# Patient Record
Sex: Male | Born: 1940 | Race: White | Hispanic: No | Marital: Married | Smoking: Former smoker
Health system: Southern US, Community
[De-identification: ages and names within clinical notes are randomized; demographics above are authoritative.]

## PROBLEM LIST (undated history)

## (undated) DIAGNOSIS — I255 Ischemic cardiomyopathy: Secondary | ICD-10-CM

## (undated) DIAGNOSIS — I509 Heart failure, unspecified: Secondary | ICD-10-CM

## (undated) DIAGNOSIS — I10 Essential (primary) hypertension: Secondary | ICD-10-CM

## (undated) DIAGNOSIS — I2510 Atherosclerotic heart disease of native coronary artery without angina pectoris: Secondary | ICD-10-CM

## (undated) DIAGNOSIS — E119 Type 2 diabetes mellitus without complications: Secondary | ICD-10-CM

## (undated) DIAGNOSIS — K219 Gastro-esophageal reflux disease without esophagitis: Secondary | ICD-10-CM

## (undated) DIAGNOSIS — M1990 Unspecified osteoarthritis, unspecified site: Secondary | ICD-10-CM

## (undated) DIAGNOSIS — I480 Paroxysmal atrial fibrillation: Secondary | ICD-10-CM

## (undated) DIAGNOSIS — I519 Heart disease, unspecified: Secondary | ICD-10-CM

## (undated) DIAGNOSIS — U071 COVID-19: Secondary | ICD-10-CM

## (undated) DIAGNOSIS — F419 Anxiety disorder, unspecified: Secondary | ICD-10-CM

## (undated) DIAGNOSIS — E785 Hyperlipidemia, unspecified: Secondary | ICD-10-CM

## (undated) DIAGNOSIS — I447 Left bundle-branch block, unspecified: Secondary | ICD-10-CM

## (undated) DIAGNOSIS — F329 Major depressive disorder, single episode, unspecified: Secondary | ICD-10-CM

## (undated) DIAGNOSIS — L219 Seborrheic dermatitis, unspecified: Secondary | ICD-10-CM

## (undated) DIAGNOSIS — N289 Disorder of kidney and ureter, unspecified: Secondary | ICD-10-CM

## (undated) DIAGNOSIS — F32A Depression, unspecified: Secondary | ICD-10-CM

## (undated) HISTORY — DX: Unspecified osteoarthritis, unspecified site: M19.90

## (undated) HISTORY — DX: Left bundle-branch block, unspecified: I44.7

## (undated) HISTORY — DX: Heart disease, unspecified: I51.9

## (undated) HISTORY — DX: Type 2 diabetes mellitus without complications: E11.9

## (undated) HISTORY — DX: Atherosclerotic heart disease of native coronary artery without angina pectoris: I25.10

## (undated) HISTORY — DX: Anxiety disorder, unspecified: F41.9

## (undated) HISTORY — DX: COVID-19: U07.1

## (undated) HISTORY — DX: Hyperlipidemia, unspecified: E78.5

## (undated) HISTORY — DX: Essential (primary) hypertension: I10

## (undated) HISTORY — PX: KNEE ARTHROTOMY: SUR107

## (undated) HISTORY — DX: Seborrheic dermatitis, unspecified: L21.9

## (undated) HISTORY — DX: Ischemic cardiomyopathy: I25.5

## (undated) HISTORY — DX: Paroxysmal atrial fibrillation: I48.0

---

## 1996-01-16 HISTORY — PX: CORONARY ARTERY BYPASS GRAFT: SHX141

## 2001-04-17 HISTORY — PX: LUMBAR DISC SURGERY: SHX700

## 2001-10-22 ENCOUNTER — Emergency Department (HOSPITAL_COMMUNITY): Admission: EM | Admit: 2001-10-22 | Discharge: 2001-10-22 | Payer: Self-pay | Admitting: Emergency Medicine

## 2001-10-22 ENCOUNTER — Encounter: Payer: Self-pay | Admitting: Emergency Medicine

## 2002-01-28 ENCOUNTER — Encounter: Payer: Self-pay | Admitting: Specialist

## 2002-01-28 ENCOUNTER — Encounter: Admission: RE | Admit: 2002-01-28 | Discharge: 2002-01-28 | Payer: Self-pay | Admitting: Specialist

## 2002-03-07 ENCOUNTER — Encounter: Payer: Self-pay | Admitting: Specialist

## 2002-03-08 ENCOUNTER — Inpatient Hospital Stay (HOSPITAL_COMMUNITY): Admission: RE | Admit: 2002-03-08 | Discharge: 2002-03-11 | Payer: Self-pay | Admitting: Specialist

## 2003-01-23 ENCOUNTER — Encounter: Payer: Self-pay | Admitting: Emergency Medicine

## 2003-01-23 ENCOUNTER — Inpatient Hospital Stay (HOSPITAL_COMMUNITY): Admission: EM | Admit: 2003-01-23 | Discharge: 2003-01-27 | Payer: Self-pay | Admitting: Emergency Medicine

## 2003-01-24 ENCOUNTER — Encounter: Payer: Self-pay | Admitting: Internal Medicine

## 2003-01-25 ENCOUNTER — Encounter: Payer: Self-pay | Admitting: Internal Medicine

## 2003-01-26 ENCOUNTER — Encounter: Payer: Self-pay | Admitting: Internal Medicine

## 2003-08-24 ENCOUNTER — Ambulatory Visit (HOSPITAL_COMMUNITY): Admission: RE | Admit: 2003-08-24 | Discharge: 2003-08-24 | Payer: Self-pay | Admitting: Gastroenterology

## 2004-08-15 HISTORY — PX: CARDIAC DEFIBRILLATOR PLACEMENT: SHX171

## 2004-08-29 ENCOUNTER — Ambulatory Visit: Payer: Self-pay | Admitting: Internal Medicine

## 2004-09-09 ENCOUNTER — Ambulatory Visit: Payer: Self-pay | Admitting: Internal Medicine

## 2004-09-09 ENCOUNTER — Ambulatory Visit (HOSPITAL_COMMUNITY): Admission: RE | Admit: 2004-09-09 | Discharge: 2004-09-10 | Payer: Self-pay | Admitting: Cardiology

## 2007-06-16 HISTORY — PX: CORONARY ANGIOPLASTY WITH STENT PLACEMENT: SHX49

## 2007-07-09 ENCOUNTER — Ambulatory Visit (HOSPITAL_COMMUNITY): Admission: RE | Admit: 2007-07-09 | Discharge: 2007-07-10 | Payer: Self-pay | Admitting: Cardiology

## 2009-05-16 ENCOUNTER — Ambulatory Visit: Payer: Self-pay | Admitting: Diagnostic Radiology

## 2009-05-16 ENCOUNTER — Emergency Department (HOSPITAL_BASED_OUTPATIENT_CLINIC_OR_DEPARTMENT_OTHER): Admission: EM | Admit: 2009-05-16 | Discharge: 2009-05-16 | Payer: Self-pay | Admitting: Emergency Medicine

## 2010-01-17 ENCOUNTER — Ambulatory Visit (HOSPITAL_COMMUNITY): Admission: RE | Admit: 2010-01-17 | Discharge: 2010-01-18 | Payer: Self-pay | Admitting: Cardiology

## 2010-04-17 HISTORY — PX: CATARACT EXTRACTION W/ INTRAOCULAR LENS  IMPLANT, BILATERAL: SHX1307

## 2010-05-19 ENCOUNTER — Telehealth (INDEPENDENT_AMBULATORY_CARE_PROVIDER_SITE_OTHER): Payer: Self-pay | Admitting: *Deleted

## 2010-05-25 NOTE — Progress Notes (Signed)
Summary: new pt defib check  Phone Note Call from Patient Call back at [phone]   Caller: Spouse/paula Reason for Call: Talk to Nurse Summary of Call: pt wife is calling to set up appt for th ept to get his defib check. pt wife states pt was ref by Dr. Francisca December to get his defib read and check.  pt wife states she called two days ago and spoke with lela and some one was going to get back with her. pt is calling and would like to talk to Neoma Uhrich.   Initial call taken by: Roe Coombs,  May 19, 2010 9:17 AM  Follow-up for Phone Call        [facility] scheduled pt for device check. Vella Kohler  May 20, 2010 3:18 PM

## 2010-05-26 ENCOUNTER — Encounter: Payer: Self-pay | Admitting: Internal Medicine

## 2010-05-26 ENCOUNTER — Encounter (INDEPENDENT_AMBULATORY_CARE_PROVIDER_SITE_OTHER): Payer: Commercial Indemnity

## 2010-05-26 DIAGNOSIS — I428 Other cardiomyopathies: Secondary | ICD-10-CM

## 2010-06-01 ENCOUNTER — Encounter: Payer: Self-pay | Admitting: Internal Medicine

## 2010-06-08 NOTE — Procedures (Signed)
Summary: Cardiology Device Clinic   Current Medications (verified): 1)  Seroquel Xr 200 Mg Xr24h-Tab (Quetiapine Fumarate) .... Take 1 Tablet By Mouth Every Evening 2)  Venlafaxine Hcl 150 Mg Xr24h-Cap (Venlafaxine Hcl) .... Take 1 Capsule By Mouth Once Daily 3)  Niaspan 500 Mg Cr-Tabs (Niacin (Antihyperlipidemic)) .... Take 2 Tablets By Mouth Two Times A Day 4)  Lotrel 5-20 Mg Caps (Amlodipine Besy-Benazepril Hcl) .... Take 1 Tablet By Mouth Once Daily 5)  Carvedilol 25 Mg Tabs (Carvedilol) .... Take 1 Tablet By Mouth Two Times A Day 6)  Pravastatin Sodium 40 Mg Tabs (Pravastatin Sodium) .... Take 1 Tablet By Mouth Once Daily 7)  Lovaza 1 Gm Caps (Omega-3-Acid Ethyl Esters) .... Take 2 Capsules By Mouth Two Times A Day 8)  Triamcinolone Acetonide 0.1 % Crea (Triamcinolone Acetonide) .... Apply Sparingly To Affected Area Externally Two Times A Day As Needed 9)  Furosemide 40 Mg Tabs (Furosemide) .... Take 1 Tablet By Mouth As Needed 10)  Aspirin 325 Mg Tabs (Aspirin) .... Take 1 Tablet By Mouth Once Daily 11)  Allopurinol 100 Mg Tabs (Allopurinol) .... Take 2 Tablets By Mouth Once Daily 12)  Ambien Cr 12.5 Mg Cr-Tabs (Zolpidem Tartrate) .... Take 1 Tablet By Mouth At Bedtime As Needed For Sleep 13)  Novolog Mix 70/30 Flexpen 70-30 % Susp (Insulin Aspart Prot & Aspart) .... 68 Units Subcutaneous Two Times A Day As Directed  Allergies (verified): No Known Drug Allergies   ICD Follow Up Battery Voltage:  3.21 V     Charge Time:  8.8 seconds     Underlying rhythm:  SR @ 73   ICD Device Measurements Atrium:  Amplitude: 3.4 mV, Impedance: 456 ohms, Threshold: 0.75 V at 0.40 msec Right Ventricle:  Amplitude: 20 mV, Impedance: 608 ohms, Threshold: 0.75 V at 0.40 msec Shock Impedance: 50/63 ohms   Episodes MS Episodes:  0     Shock:  0     ATP:  0     Nonsustained:  0     Atrial Therapies:  0 Atrial Pacing:  <0.1%     Ventricular Pacing:  <0.1%  Brady Parameters Mode MVP     Lower Rate  Limit:  40     Upper Rate Limit 130 PAV 180     Sensed AV Delay:  150  Tachy Zones VF:  >200     VT1:  171-200     Next Cardiology Appt Due:  08/16/2010 Tech Comments:  NORMAL DEVICE FUNCTION.  2 NST EPISODES. OPTIVOL STABLE AT THIS TIME.  IMPEDANCE TRENDS STABLE.  TURNED ON 1:1 SVT AND CHANGED RA OUTPUT FROM 1.50 TO 2.00 AND RV OUTPUT FROM 2.25 TO 2.50 V.  CHANGED MAX LEAD IMPEDANCES FOR LIA.  PT PREFERS OV RATHER THAN CARELINK.  ROV IN 3 MONTHS W/JA IN [facility] OFFICE. Vella Kohler  June 01, 2010 5:42 PM

## 2010-06-23 NOTE — Cardiovascular Report (Signed)
Summary: Office Visit   Office Visit   Imported By: Roderic Ovens 06/13/2010 16:05:29  _____________________________________________________________________  External Attachment:    Type:   Image     Comment:   External Document

## 2010-06-29 LAB — GLUCOSE, CAPILLARY
Glucose-Capillary: 139 mg/dL — ABNORMAL HIGH (ref 70–99)
Glucose-Capillary: 205 mg/dL — ABNORMAL HIGH (ref 70–99)
Glucose-Capillary: 243 mg/dL — ABNORMAL HIGH (ref 70–99)
Glucose-Capillary: 249 mg/dL — ABNORMAL HIGH (ref 70–99)

## 2010-07-04 LAB — DIFFERENTIAL
Basophils Absolute: 0.1 K/uL (ref 0.0–0.1)
Basophils Relative: 1 % (ref 0–1)
Eosinophils Absolute: 0.1 K/uL (ref 0.0–0.7)
Eosinophils Relative: 0 % (ref 0–5)
Lymphocytes Relative: 3 % — ABNORMAL LOW (ref 12–46)
Lymphs Abs: 0.4 10*3/uL — ABNORMAL LOW (ref 0.7–4.0)
Monocytes Absolute: 0.6 K/uL (ref 0.1–1.0)
Monocytes Relative: 4 % (ref 3–12)
Neutro Abs: 12.5 K/uL — ABNORMAL HIGH (ref 1.7–7.7)
Neutrophils Relative %: 91 % — ABNORMAL HIGH (ref 43–77)

## 2010-07-04 LAB — URINE MICROSCOPIC-ADD ON

## 2010-07-04 LAB — COMPREHENSIVE METABOLIC PANEL
CO2: 26 mEq/L (ref 19–32)
Calcium: 9.1 mg/dL (ref 8.4–10.5)
Creatinine, Ser: 1.3 mg/dL (ref 0.4–1.5)
GFR calc Af Amer: >60 mL/min (ref >60)
GFR calc non Af Amer: 55 mL/min — ABNORMAL LOW (ref >60)
Glucose, Bld: 232 mg/dL — ABNORMAL HIGH (ref 70–99)

## 2010-07-04 LAB — COMPREHENSIVE METABOLIC PANEL WITH GFR
ALT: 16 U/L (ref 0–53)
AST: 24 U/L (ref 0–37)
Albumin: 4.5 g/dL (ref 3.5–5.2)
Alkaline Phosphatase: 72 U/L (ref 39–117)
BUN: 27 mg/dL — ABNORMAL HIGH (ref 6–23)
Chloride: 102 meq/L (ref 96–112)
Potassium: 4.8 meq/L (ref 3.5–5.1)
Sodium: 141 meq/L (ref 135–145)
Total Bilirubin: 0.5 mg/dL (ref 0.3–1.2)
Total Protein: 8.3 g/dL (ref 6.0–8.3)

## 2010-07-04 LAB — CBC
HCT: 46.2 % (ref 39.0–52.0)
Hemoglobin: 15.1 g/dL (ref 13.0–17.0)
MCHC: 32.7 g/dL (ref 30.0–36.0)
MCV: 91.8 fL (ref 78.0–100.0)
Platelets: 240 K/uL (ref 150–400)
RBC: 5.04 MIL/uL (ref 4.22–5.81)
RDW: 14.5 % (ref 11.5–15.5)
WBC: 13.7 K/uL — ABNORMAL HIGH (ref 4.0–10.5)

## 2010-07-04 LAB — URINALYSIS, ROUTINE W REFLEX MICROSCOPIC
Bilirubin Urine: NEGATIVE
Glucose, UA: NEGATIVE mg/dL
Hgb urine dipstick: NEGATIVE
Ketones, ur: 15 mg/dL — AB
Nitrite: NEGATIVE
Protein, ur: 30 mg/dL — AB
Specific Gravity, Urine: 1.023 (ref 1.005–1.030)
Urobilinogen, UA: 0.2 mg/dL (ref 0.0–1.0)
pH: 6 (ref 5.0–8.0)

## 2010-07-04 LAB — LIPASE, BLOOD: Lipase: 62 U/L (ref 23–300)

## 2010-09-02 NOTE — H&P (Signed)
NAME:  [name], [name]                          ACCOUNT NO.:  [id number]   MEDICAL RECORD NO.:  [id number]                   PATIENT TYPE:  AMB   LOCATION:  DAY                                  FACILITY:  [facility]   PHYSICIAN:  Jene Every, M.D.                 DATE OF BIRTH:  [birth year]   DATE OF ADMISSION:  03/07/2002  DATE OF DISCHARGE:                                HISTORY & PHYSICAL   CHIEF COMPLAINT:  Low back and lower extremity pain.   HISTORY OF PRESENT ILLNESS:  The patient is a 70 year old male who back in  July 2003 while at work noticed a popping pain in his lower back.  This  occurred when he was bending over to pick up a container that weighed  approximately 50 pounds.  When he twisted to set it up on a shelf this is  when he noticed the pop in his back.  The pain gradually became worse and  the next day he was seen at [facility] emergency room where x-rays were  taken.  He was subsequently seen in our office and diagnosed with mechanical  low back pain with acute lumbar strain.  The patient was followed under  conservative measures which included a Dosepak, anti-inflammatories.  The  patient continues to not progress.  He was later seen by Dr. Ethelene Hal for  epidural steroid injections which he got no relief from.  He was also sent  to physical therapy where he did not progress or meet any of his goals  except for independence and compliance with the home exercise program.  CT  myelogram of his lumbar spine showed a moderate disk protrusion at L4-5 with  multifactorial lateral recess stenosis, left greater than the right, with  apparent compression of the L5 nerve root.  Considering the fact the patient  had failed conservative measures and he did not want to live with the  symptoms that he was having at this point, he decided to undergo surgical  intervention.  The risks and benefits of the procedure were discussed with  the patient by Dr. Shelle Iron and the patient will be  admitted to [facility] on March 07, 2002 to undergo a lumbar decompression at L4-5.   PAST MEDICAL HISTORY:  1. Non-insulin-dependent diabetes.  2. Hypercholesterolemia.  3. Gout.  4. Hypertension.  5. Coronary artery disease.  6. Osteoarthritis.   PAST SURGICAL HISTORY:  1. Bypass x5 in 1997.  2. Right knee scope.   CURRENT MEDICATIONS:  1. Amaryl 4 mg one p.o. q.d.  2. Coreg 25 mg one p.o. b.i.d.  3. Spironolactone 25 mg one-half p.o. q.d.  4. Altace 5 mg one p.o. b.i.d.  5. Colchicine 0.6 mg one p.o. q.d.  6. Zoloft 50 mg one p.o. q.d.  7. Pravachol 20 mg one p.o. q.d.  8. Niaspan 1000 mg two p.o. q.d.  9.  Aspirin one p.o. q.d.  10.      Vioxx 25 mg one p.o. p.r.n. pain.   ALLERGIES:  No known drug allergies.   SOCIAL HISTORY:  The patient is married.  He lives at home with his wife.  He has three children.  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  He denies any tobacco  use.  Does admit to occasional alcohol use.  He lives in a one-story home  with only one entry stair.  His wife will be his caregiver following  surgery.   FAMILY HISTORY:  Significant for coronary artery disease, cerebrovascular  accidents, diabetes mellitus.  Mother is living.  Father is deceased at age  65 of natural causes.   REVIEW OF SYSTEMS:  GENERAL:  No fevers, chills, night sweats, or bleeding  tendencies.  CNS:  No blurred or double vision, seizure, headache, or  paralysis.  RESPIRATORY:  No shortness of breath, productive cough, or  hemoptysis.  CARDIOVASCULAR:  No chest pain, angina, or orthopnea.  GI:  Positive for diarrhea.  No nausea, vomiting, constipation, or melena.  GU:  No dysuria, hematuria, or discharge.  MUSCULOSKELETAL:  As pertinent to the  HPI.   PHYSICAL EXAMINATION:  VITAL SIGNS:  Pulse 80, respiratory rate 16, blood  pressure 142/76.  GENERAL:  This is a well-developed, well-nourished 70 year old male in no  acute distress.  He does have a slightly blunted affect.   HEENT:  Atraumatic, normocephalic.  Pupils equal, round, and reactive to  light.  EOMs intact.  NECK:  Supple, no lymphadenopathy.  CHEST:  Clear to auscultation bilaterally.  No rhonchi, wheezes, or rales.  HEART:  Regular rate and rhythm without murmurs, gallops, or rubs.  ABDOMEN:  Soft, nontender, nondistended.  Bowel sounds x4.  BREAST AND GENITOURINARY:  Not examined not pertinent to HPI.  SKIN:  No rashes or lesions are noted.  MUSCULOSKELETAL:  Straight leg raise on the left produces buttocks,  posterior thigh, and calf pain.  Straight leg raise on the right also  produces buttock and posterior thigh pain.  EHL is 5-5 on the left.  Sensation is altered in the L5 dermatome.  The patient is nontender over the  thoracic spine.  His lateral hips and knees are examined and are  unremarkable.  NEUROLOGIC:  Neurovascular exam is intact.   LABORATORY DATA:  Myelogram demonstrates moderate disk protrusion at L4-5  with multifactorial lateral recess stenosis of left greater than right with  apparent compression of the L5 nerve root.   IMPRESSION:  1. Spinal stenosis, L4-5.  2. Non-insulin-dependent diabetes.  3. Hypercholesterolemia.  4. Gout.  5. Hypertension.  6. Coronary artery disease.  7. Osteoarthritis.    PLAN:  The patient will be admitted to [facility] on March 07, 2002 to undergo a lumbar decompression at L4-5 by  Dr. Jene Every.  The patient has cardiac clearance from Dr. Amil Amen and  also medical clearance from Dr. Arvilla Market.  They will be involved in his  medical care while he is an inpatient.     Roma Schanz, P.A.                   Jene Every, M.D.    CS/MEDQ  D:  02/27/2002  T:  02/27/2002  Job:  [id number]

## 2010-09-02 NOTE — Cardiovascular Report (Signed)
NAMEDANE, KOPKE NO.:  [id number]   MEDICAL RECORD NO.:  [id number]          PATIENT TYPE:  OIB   LOCATION:  6522                         FACILITY:  [facility]   PHYSICIAN:  Francisca December, M.D.  DATE OF BIRTH:  [birth year]   DATE OF PROCEDURE:  DATE OF DISCHARGE:  07/10/2007                            CARDIAC CATHETERIZATION   DATE OF PROCEDURE:  July 09, 2007.   PROCEDURES PERFORMED:  1. Left heart catheterization.  2. Coronary angiography.  3. Saphenous vein graft angiography.  4. Arterial conduit angiography.  5. Percutaneous transluminal coronary intervention/bare-metal stent      implantation distal right coronary via the SVG.   INDICATIONS:  [name] is a 70 year old man with severe  atherosclerotic cardiovascular disease.  He is 12 years status post  CABG.  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  He has known moderate reduction in left ventricular  systolic function.  He has had recently increasing fatigue and  exertional dyspnea.  Myocardial perfusion study in the office revealed  LVEF in the 35% range and a large relatively fixed inferior wall defect  as well as a new mid anterior wall defect that was reversible.  Because  of the new anterior defect, although it was relatively small and a drop  in his LVEF, he was brought to the catheterization laboratory at this  time to identify progressive CAD or graft disease and provide for  further therapeutic options.   PROCEDURAL NOTE:  The patient is brought to the cardiac catheterization  laboratory in the fasting state.  The right groin was prepped and draped  in the usual sterile fashion.  Local anesthesia was obtained with  infiltration of 1% lidocaine.  A 6-French catheter sheath was inserted  potentially into the right femoral artery utilizing an anterior approach  over a guiding J-wire.  Left heart catheterization and coronary  angiography then proceeded  in standard fashion using a 6-French 110 cm  pigtail catheter and a 6-French number 4 left and right Judkins  catheters.  The Judkins catheter was used to cannulate the saphenous  vein graft to the marginal branch.  It was also used to cannulate and  perform angiography to the saphenous vein graft to the right coronary.  A left coronary bypass graft catheter was required for the saphenous  vein graft to the diagonal branch.  The LIMA catheter was used to  perform angiography of the LIMA to the LAD via the left subclavian  artery.   These images were reviewed and I elected to proceed with a percutaneous  intervention of an 80-90% stenosis in the distal right coronary via the  saphenous vein graft.  The patient received 50 mg/kg of heparin, 300 mg  of Plavix, a double bolus and constant infusion of Integrilin.  The  resultant ACT was 280 seconds.   A 6-French multipurpose number 1 guiding catheter was advanced to the  ascending aorta where the saphenous vein graft to the right coronary was  cannulated.  A 0.014 inch Luge intracoronary guidewire was passed across  the lesion of  the distal right coronary without difficulty.  Initial  balloon dilatation was performed with a 2.5/20 mm Scimed Maverick  intracoronary balloon.  This was positioned across the lesion and  inflated to 6 atmospheres for 20 seconds.  This balloon was deflated and  removed.  A 3.0/20 mm Scimed Taxus Liberte was then advanced into place,  carefully positioned and deployed at peak pressure of 11 atmospheres for  50 seconds.  The stent balloon was deflated and removed, and post-  dilatation performed with a 3.5/15 mm Quantum Maverick intracoronary  balloon.  This was carefully positioned within the stented segment and  inflated in 2 positions to not greater than 16 atmospheres for  approximately 50 seconds.  Stent balloon was deflated and removed.  Following confirmation of adequate patency in orthogonal views both  with  and without the guidewire in place, the guiding catheter was removed.  A  right femoral arteriogram in the 45 degree RAO angulation via the  catheter sheath by hand injection documented adequate anatomy for  placement of percutaneous closure by Angioseal.  This was subsequent  deployed with good hemostasis and intact distal pulse.   HEMODYNAMIC RESULTS:  Systemic arterial pressure was 137/71 with a mean  of 99 mmHg.  There was no systolic gradient across the aortic valve.  Left ventricular end-diastolic pressure was 19 mmHg  pre-ventriculogram.   ANGIOGRAPHY:  The left ventriculogram demonstrated normal chamber size  and a mild-moderate reduction of left ventricular systolic function.  There is inferobasal severe hypokinesis, mid inferior akinesis and infra-  apical hypokinesis.  Visual estimate of the ejection fraction is in the  30-35% range.  There is no significant mitral regurgitation.  The aortic  valve is trileaflet and opens normally during systole.  Left and right  coronary calcification is seen.   There is a right-dominant coronary system present.  Main left coronary  artery is fairly long and has a 30% ostial stenosis.  Then in the distal  segment, there is a 90% stenosis which is best seen in the LAO cranial  angulation.  The ongoing anterior descending artery then has an 80%  stenosis in the proximal segment which is diffuse in nature and then is  completely occluded after the first septal perforator.   There is a small-moderate-sized diagonal branch which arises very  proximally on the anterior descending artery that has no obstruction  seen other than some moderate stenosis in the very proximal ostial  portion of the LAD.   The left circumflex coronary artery is highly diseased; there is a small  first marginal branch that arises and then the distal portion is highly  diseased, small and appears to show some competitive filling, but not  further than the small  second diagonal branch.   The right coronary artery is completely occluded at its origin.   The saphenous vein graft to the marginal branch is widely patent and  inserts upon a relatively large vessel that provides retrograde and  antegrade filling.  Retrograde filling enters the circumflex and  proceeds distally to very small posterolateral branch.  It proceeds more  proximally to the segment where competitive flow was seen earlier.  Antegrade filling completely fills a bifurcating marginal branch on the  direct lateral wall of the heart.   The saphenous vein graft to the right coronary is widely patent.  It  inserts upon the distal portion of the right coronary.  Minimal degree  of retrograde filling is seen to 2 small right  ventricular branches and  then the right coronary is completely occluded in the mid-distal  segment.  Antegrade filling enters the large posterolateral segment  after the origin of the small-moderate sized posterior descending  artery.  The posterolateral segment contains an eccentric tubular 90%  stenosis and then the distal portion of the posterolateral segment gives  rise to 2 small left ventricular branches and then a large  posterolateral branch.   The LIMA graft to the LAD is widely patent and inserts upon the mid-  distal vessel.  It provides retrograde and antegrade filling.  Retrograde filling is to the complete occlusion and it follows several  small septal perforators.  Antegrade filling proceeds to the apex where  the LAD bifurcates into the whale tail.  There are no significant  obstructions seen in the ongoing anterior descending artery.   The saphenous vein graft to the diagonal branch is widely patent and  inserts upon a moderate-sized diagonal branch which has no obstructions  seen except within the retrograde segment where it is completely  occluded as it inserts upon the LAD.   Following balloon dilatation in the posterolateral segment of  the right  coronary, no residual stenosis is seen.   FINAL IMPRESSION:  1. Atherosclerotic cardiovascular disease, severe three-vessel.  2. Moderate left ventricular systolic dysfunction with regional wall      motion abnormalities as noted, left ventricular ejection fraction      30-35%.  3. Elevated left ventricular end-diastolic pressure.  4. Status post successful percutaneous coronary intervention/bare-      metal stent implantation posterolateral segment of the right      coronary.  5. There is a residual tight distal left main stenosis with minimal      flow into the left anterior descending and also the left      circumflex, although a small-moderate sized diagonal branch is      present which is likely producing the anterior defect on his      perfusion study.      Francisca December, M.D.  Electronically Signed     JHE/MEDQ  D:  07/11/2007  T:  07/12/2007  Job:  [id number]

## 2010-09-02 NOTE — Op Note (Signed)
NAME:  [name], [name]                          ACCOUNT NO.:  [id number]   MEDICAL RECORD NO.:  [id number]                   PATIENT TYPE:  AMB   LOCATION:  DAY                                  [facility]   PHYSICIAN:  Jene Every, M.D.                 DATE OF BIRTH:  1940-07-08   DATE OF PROCEDURE:  DATE OF DISCHARGE:                                 OPERATIVE REPORT   PREOPERATIVE DIAGNOSES:  Spinal stenosis, herniated nucleus pulposus L4-5.   POSTOPERATIVE DIAGNOSES:  Spinal stenosis, herniated nucleus pulposus L4-5,  bulging disk.   PROCEDURE:  Central decompression, bilateral hemilaminotomy, lateral recess  decompression, foraminotomy.   ANESTHESIA:  General.   ASSISTANT:  Roma Schanz, P.A.   BRIEF HISTORY:  A 70 year old with refractory lower extremity radicular pain  right greater than left. MRI indicating lateral recess stenosis with a  compression of the 5 nerve root. This is multifactorial, operative  intervention was indicated for decompression. The risks and benefits were  discussed including bleeding, infection, damage to neurovascular structures,  CSF leakage, epidural fibrosis, adjacent segment disease, etc.   TECHNIQUE:  The patient in the supine position, after an adequate level of  general anesthesia and 1 gm of Kefzol, he was placed prone on the [place]  frame. All bony prominences were well-padded, lumbar region was prepped and  draped in the usual sterile fashion. Two 18-gauge spinal needles were  utilized to localize the L4-5 interspace confirmed on x-rays.  Attempt was  made at the spinous processes 4-5, subcutaneous tissue was dissected,  electrocautery utilized to achieve hemostasis. The dorsolumbar fascia was  identified and divided in line with the skin incisions. The paraspinous  muscles was elevated from the lamina of 4 and 5. Obtained a confirmatory  radiograph.   Next, we attempted to form a hemilaminotomy bilaterally; however, due  to the  sheer angle of the lamina and the fairly small interlaminar width, felt it  was safer to proceed with a central decompression. We therefore removed the  ligamentum in the spinous process and partial spinous process of 3, L4 and  L5. I decompressed the lateral recess with a 2 and a 3 mm Kerrison  decompressing the facet, partial medial hemifacetectomy was performed  bilaterally. I then undercut the facet and removed the ligamentum flavum  bilaterally. There was severe ligamentum flavum hypertrophy and compression  of 5 roots in the lateral recess. We decompressed that after performing an  L5 foraminotomy. Gently mobilized the nerve root protecting it all times.  Bipolar electrocautery was utilized to achieve hemostasis with extensive  epidural venous plexus. This was decompressed on the left and on the right  as well. I examined the disk on both sides and there was some bulging noted  but no frank herniation and I felt that due to the central decompression,  additional stability afforded by the intact disk was imperative therefore  left that uncompromised. Hockey stick probe placed at the foramen of 4 and 5  found to be widely patent. We copiously irrigated the wound and placed  thrombin soaked Gelfoam in the laminotomy defect. Bone wax was placed over  the cancellous surfaces. I removed the McCullough retractor and the  operating microscope, inspected the paraspinous muscles. Inspected with no  evidence of active bleeding and was meticulously hemostased. I then  reapproximated the dorsolumbar fascia and the musculature with #1 Vicryl  interrupted figure-of-eight sutures. The subcutaneous tissue was  reapproximated with  subcutaneous 2-0 Vicryl simple sutures, the skin was  reapproximated with staples. The  wound was dressed sterilely patient placed supine on the hospital bed,  extubated without difficulty and transported to the recovery room in  satisfactory condition.   The  patient tolerated the procedure well with no complications.                                                Jene Every, M.D.    Cordelia Pen  D:  03/07/2002  T:  03/07/2002  Job:  [id number]

## 2010-09-02 NOTE — Op Note (Signed)
[name], [name]                ACCOUNT NO.:  [id number]   MEDICAL RECORD NO.:  [id number]          PATIENT TYPE:  OIB   LOCATION:  2899                         FACILITY:  [facility]   PHYSICIAN:  Francisca December, M.D.  DATE OF BIRTH:  Oct 13, 1940   DATE OF PROCEDURE:  09/09/2004  DATE OF DISCHARGE:                                 OPERATIVE REPORT   PROCEDURES PERFORMED:  1.  Left subclavian venogram.  2.  Insertion of single chamber implantable cardiac defibrillator.   SURGEON:  Corliss Marcus, M.D.   ELECTROPHYSIOLOGIST:  Sherryl Manges, M.D.   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 The patient has developed worsening left ventricular function which  is now an ejection fraction of 28% by a recent gated spec study. The patient  does not have significant symptoms of heart failure nor are there AV  conduction abnormalities. He is to undergo insertion of a single chamber  implantable cardiac defibrillator under the MEDIT II criteria.   PROCEDURE NOTE:  The patient is brought to the cardiac catheterization  laboratory in the fasting state. The left prepectoral region was prepped and  draped in the usual sterile fashion. Local anesthesia was obtained with the  infiltration of 1% lidocaine with epinephrine throughout the left  prepectoral region. A left subclavian venogram was then performed with a  peripheral injection of 20 mL of Omnipaque. A digital cine angiogram was  obtained to guide future left subclavian puncture. The venogram did  demonstrate the subclavian vein to be widely patent and coursing in a normal  fashion over the anterior surface of the first rib and beneath the middle  third of the clavicle. There was no evidence for persistence of the left  superior vena cava. A 7-8 cm incision was then made in the deltopectoral  groove and this was carried down by  sharp dissection and electrocautery to  the prepectoral fascia. There, a plane was lifted and a pocket formed  inferiorly and medially using electrocautery and blunt dissection. The  pocket was then packed with 1% kanamycin soaked gauze. A single left  subclavian puncture was then performed using an 18-gauge thin-wall needle  through which was passed a 0.038-inch tight J guidewire. Over this  guidewire, a 9-French tear away sheath and dilator were advanced. The  dilator was removed. The wire was allowed to remain in place. The  ventricular lead was advanced to the level of the right atrium and the  sheath was torn away. A figure-of-eight hemostasis suture was required  around the insertion site of the lead in the pectoralis muscle to control  backbleeding. Using standard technique and fluoroscopic landmarks, the lead  was manipulated into the right ventricular apex. There, excellent pacing  parameters were obtained both pre and post advancing the retention screw.  These parameters are reported below. The lead was tested for diaphragmatic  pacing at 10 volts and none was found. The lead was then sutured into place  using three separate silk ligatures. The kanamycin  soaked gauze and  remaining guidewire were removed from the pocket, and the pocket was  copiously irrigated using 1% kanamycin solution. The pace shock generator  was then connected to the leads carefully identifying each and placing each  into the appropriate receptacle. Each was tightened into place and tested  for security. The leads were then wound beneath the pacing generator and the  generator was placed in the pocket. The pocket was inspected for bleeding  and none was found.   Defibrillation testing was then performed under the supervision of Dr.  Sherryl Manges. The patient received a total of  11 milligrams of Versed and 200 mcg of fentanyl for significant conscious  sedation. Ventricular fibrillation was induced using  the shock on T  technique. There was successful detection and delivery of a 20 joules shock  after a 6-second sense and charge pause. The impedance was 47 ohms. After  another 5 minutes had elapsed, the ventricular defibrillation was again  induced by the shock on T technique. The device successfully detected and  charged this time  also in 6 seconds. A 20 joules shock was delivered with  successful type 1 break and restoration of sinus rhythm. Again, 47 ohms  impedance. There was no drop out at 1.2 millivolt sensitivity.   PACING DATA:  The ventricular lead detected an 18.7 mV R-wave. The impedance  was 1007 ohms. The pacing threshold was 0.2 volts at 0.5 milliseconds pulse  width. The current at capture threshold was 1.3 MA. These parameters were  obtained through the analyzer. A 13 mV R-wave was detected and the impedance  was 904 ohms. The threshold was 1.5 volts at 0.4 milliseconds.   EQUIPMENT DATA:  The pace shock generator is a Medtronic Maximo VR model  number 7232CX, serial number [id number]. The ventricular paced shock lead is  a Medtronic model number O152772, serial number [id number].      JHE/MEDQ  D:  09/09/2004  T:  09/09/2004  Job:  [id number]   cc:   Duke Salvia, M.D.   Donia Guiles, M.D.  [address] [place]  [place] [address]  Fax: [phone]

## 2010-09-02 NOTE — Discharge Summary (Signed)
NAME:  ROBERTS, BON                          ACCOUNT NO.:  [id number]   MEDICAL RECORD NO.:  [id number]                   PATIENT TYPE:  INP   LOCATION:  3710                                 FACILITY:  [facility]   PHYSICIAN:  Sherin Quarry, MD                   DATE OF BIRTH:  1940/11/17   DATE OF ADMISSION:  01/23/2003  DATE OF DISCHARGE:  01/27/2003                                 DISCHARGE SUMMARY   BRIEF HISTORY:  [name] is a 70 year old man who had an episode of  acute gout about six weeks prior to admission which was treated and resolved  over about one week.  On the Monday prior to admission, the patient again  developed right knee and left foot symptoms consistent with gout.  He was  placed on an increased dose of colchicine and then began to experience  severe diarrhea and abdominal discomfort.  He was evaluated the day of  admission in Dr. Roselie Skinner office where he was found to be confused,  possibly secondary to excess dose of sleeping pills and was experiencing  severe pain secondary to gout.   PHYSICAL EXAMINATION:  Physical examination at the time of admission as  described by Dr. Efraim Kaufmann L. Taylor:  VITAL SIGNS:  Blood pressure was 120/80, pulse 72, respiratory rate 18, O2  saturation was 96%.  HEENT:  Within normal limits.  CHEST:  Clear to auscultation.  CARDIOVASCULAR:  Regular rate and rhythm.  There was normal S1 and S2  without murmurs, rubs, or gallops.  ABDOMEN:  Soft, normal bowel sounds without masses or tenderness. There was  no guarding or rebound.  NEUROLOGIC:  Testing was remarkable for the patient being oriented to person  and place but not time.  He seemed to have some impairment of short-term  memory.  He did not appear to be experiencing auditory or visual  hallucinations at the time of admission.   ADMISSION LABORATORY DATA:  Sodium 133, potassium 4.0, creatinine 1.3, BUN  23.  Hemoglobin 9.9.  Cardiac enzymes were negative.   Relevant  additional studies obtained included a CT scan of the brain which  showed mild atrophy with no acute intracranial abnormalities.  MRI scan of  the brain was done.  This showed excellent vasculature with no signs of  obstructive changes.  There was no evidence on MRI for intracranial aneurysm  or hemorrhage.  No acute intracranial abnormalities were identified.   HOSPITAL COURSE:  Initially on admission, the patient was taken off of  colchicine with the thought that this was probably responsible for his  abdominal complaints.  He was placed on Indocin 50 mg t.i.d. by Dr. Ladona Ridgel.  Subsequently, the patient's gout improved but he experienced evidence of  some renal dysfunction and therefore, on January 26, 2003, the Indomethacin  was discontinued and he was given prednisone 40 mg daily.  By January 27, 2003, the patient appeared to be doing extremely well.  As best I could  determine, there was no indication of any residual gout symptoms.  He  indicated that he was not experiencing any joint pain.  Therefore, decision  was made to discharge at that time.   DISCHARGE DIAGNOSES:  1. Severe gout.  2. Abdominal pain possibly secondary to colchicine.  3. Coronary artery disease, status post coronary artery bypass grafting.  4. Hypertension.  5. Diabetes.   DISCHARGE MEDICATIONS:  1. The patient will be discharged on a rapidly tapering schedule of     prednisone as per the pink sheet.  We will have him take 30 mg x1 day, 20     mg x1 day, 10 mg x1 day, then stop.  This rapid taper is because of     concerns in regard to his blood sugar.  2. He will continue aspirin 325 mg daily.  3. Coreg 12.5 mg b.i.d.  4. Pravachol 30 mg daily.  5. Altace 5 mg daily.  6. Effexor 75 mg daily.  7. Amaryl 2 mg daily.  8. Pepcid.  9. Nortriptyline.   FOLLOW UP:  He will be instructed to follow up with Dr. Arvilla Market in five to  seven days.                                                Sherin Quarry,  MD    SY/MEDQ  D:  01/27/2003  T:  01/27/2003  Job:  [id number]   cc:   Donia Guiles, M.D.  [address] [place]  [place] [address]  Fax: [phone]

## 2010-09-02 NOTE — Op Note (Signed)
NAME:  Brandon, [name]                          ACCOUNT NO.:  [id number]   MEDICAL RECORD NO.:  [id number]                   PATIENT TYPE:  AMB   LOCATION:  ENDO                                 FACILITY:  [facility]   PHYSICIAN:  Danise Edge, M.D.                DATE OF BIRTH:  Jan 23, 1941   DATE OF PROCEDURE:  08/24/2003  DATE OF DISCHARGE:                                 OPERATIVE REPORT   PROCEDURE:  Screening colonoscopy.   REFERRED BY:  Donia Guiles, M.D.   INDICATIONS FOR PROCEDURE:  [name] is a 70 year old male born 12/01/1940.  [name] is scheduled to undergo his first screening colonoscopy  with polypectomy to prevent colon cancer.   ENDOSCOPIST:  Danise Edge, M.D.   PREMEDICATION:  Versed 6 mg, Demerol 60 mg.   DESCRIPTION OF PROCEDURE:  After obtaining informed consent, [name] was  placed in the left lateral decubitus position. I administered intravenous  Demerol and intravenous Versed to achieve conscious sedation for the  procedure. The patient's blood pressure, oxygen saturation and cardiac  rhythm were monitored throughout the procedure and documented in the medical  record.   Anal inspection and digital rectal exam were normal. The prostate was non-  nodular. The Olympus adjustable pediatric colonoscope was introduced into  the rectum and advanced to the cecum. Colonic preparation for the exam today  was satisfactory.   [name] has universal colonic diverticulosis without diverticulitis or  diverticular stricture formation.   RECTUM:  Normal.   SIGMOID COLON AND DESCENDING COLON:  Normal.   SPLENIC FLEXURE:  Normal.   TRANSVERSE COLON:  Normal.   HEPATIC FLEXURE:  Normal.   ASCENDING COLON:  Normal.   CECUM AND ILEOCECAL VALVE:  Normal.   ASSESSMENT:  Normal screening proctocolonoscopy to the cecum.  No endoscopic  evidence for the presence of colorectal neoplasia.                                               Danise Edge, M.D.    MJ/MEDQ  D:  08/24/2003  T:  08/24/2003  Job:  [id number]   cc:   Donia Guiles, M.D.  [address] [place]  [place] [address]  Fax: [phone]

## 2010-09-02 NOTE — Discharge Summary (Signed)
NAME:  [name], [name]                          ACCOUNT NO.:  [id number]   MEDICAL RECORD NO.:  [id number]                   PATIENT TYPE:  INP   LOCATION:  0465                                 FACILITY:  [facility]   PHYSICIAN:  Jene Every, M.D.                 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  2. Non-insulin-dependent diabetes.  3. Hypercholesterolemia.  4. Gout.  5. Hypertension.  6. Coronary artery disease.  7. Osteoarthritis.   DISCHARGE DIAGNOSES:  1. Spinal stenosis status post central decompression, bilateral     hemilaminotomy, lateral __________ decompression, and foraminotomy.  2. Non-insulin-dependent diabetes.  3. Hypercholesterolemia.  4. Gout.  5. Hypertension.  6. Coronary artery disease.  7. Osteoarthritis.  8. Mild hyponatremia.  9. Nausea due to pain medication.   PROCEDURE:  The patient was taken to the operating room on March 07, 2002  and underwent central decompression, bilateral hemilaminotomy, lateral  __________ decompression, and foraminotomy L4-5.  Surgeon was Dr. Jene Every.  Assistant is Roma Schanz, P.A.-C.  Surgery was done under  general anesthesia.  Also, additional procedure was done in the OR with  removal of the left great toenail.   CONSULTS:  PT/OT.   BRIEF HISTORY:  The patient is a 70 year old male who back in July 2003  while at work noticed a popping pain in his lumbar spine.  This occurred  when he was bending over to pick up a container that weighed approximately  50 pounds.  When he twisted to set it up on the shelf it was then that he  noticed a pop in his back.  The pain has gradually gotten worse.  The  patient has undergone conservative measures including dose pack and anti-  inflammatories __________ in the pain.  The patient also was seen by Dr.  Ethelene Hal for epidural steroid injection and also failed this therapy.  Physical  therapy goals were not met.  CT myelogram lumbar spine showed a moderate  disk protrusion at L4-5 with multifactorial lateral recess stenosis left  greater than right with compression at the L5 nerve root.  Considering the  fact that patient failed conservative measures and he did not feel that he  could live with the symptoms, he is to undergo intervention as indicated.  Therefore, the patient was subsequently admitted to the hospital to undergo  the above stated procedure.   LABORATORY DATA:  CBC on admission showed a hemoglobin of 11.7, hematocrit  35.0.  Serial H&Hs were followed throughout the hospital course but  hemoglobin did decline down to a level of 9.6, hematocrit of 29.0.  The  differential on the admission CBC was all within normal limits.  Chemistry  panel on admission showed a glucose of  260, otherwise normal values.  Serial  BMETs were followed throughout the hospital course which showed mild  hyponatremia.  At time of discharge the patient's sodium was 132, glucose of  184, and calcium of 8.1.  Urinalysis done at time of admission showed a  large amount of glucose, otherwise all within normal limits.   HOSPITAL COURSE:  The patient was admitted to [facility] and taken  to the operating room.  He underwent the above stated procedure without  complications.  He tolerated the procedure well and was allowed to return to  the recovery room and then to the orthopedic floor to continue postoperative  care.  The patient was placed on PCA analgesics of morphine postoperatively  for pain control.  The PCA was kept until postoperative day number three  when he was weaned over to p.o. analgesics.  The patient seemed to tolerate  this well, although he did have occasional episodes of some nausea.  At the  time of discharge the patient's pain was well controlled on p.o.  medications.  Hemoglobin  and hematocrit were followed very closely.  The  patient's hemoglobin did drop to 9.6 but he remained asymptomatic without  any weakness or dizziness noted.  The patient did have several episodes  postoperative day number two and three of hypoglycemia.  This was managed by  stopping some of his home medications including his Amaryl.  The patient  also treated with antiemetics for his nausea and was given fluid hydration  and serial BMETs were followed to monitor his hyponatremia.  Once these  actions were taken patient improved and his glucose level returned to  baseline.  The patient's neurovascular and motor function were intact  throughout hospital course.  The calves remained soft, nontender but the  patient did complain of some decreased sensation on the dorsal aspect of his  right foot.  Postoperative day number two the abdomen was slightly distended  but the patient did have bowel movement postoperative day number one.  Bowel  sounds were present.  The patient's abdomen was nontender.  Diet was  advanced as tolerated.  The patient was placed on Trinsicon one p.o. t.i.d.  on postoperative day number three for his postoperative hemorrhagic anemia.  Postoperative day number four the patient was doing well.  He continued to  have some low back and lower extremity soreness.  He was still slightly  nauseated, mainly due to pain medication.  The patient's pain was well  controlled on p.o. medication and it is felt at this time that patient could  be discharged home.   DISPOSITION:  The patient was discharged home on March 11, 2002.   DISCHARGE MEDICATIONS:  1. Mepergan Fortis p.r.n. pain.  2. Robaxin 500 mg p.r.n. spasm.  3. Trinsicon one p.o. q.d. x3 weeks.  4. He may resume all home medications.   DIET:  As tolerated.   ACTIVITY:  The patient can ambulate as tolerated.  He should avoid bending, stooping, twisting, or lifting.  No prolonged sitting or standing.  In  regard to  his left toe, the patient will be weightbearing as tolerated.  There are no restrictions in that regard.   WOUND CARE:  He is to keep the incision clean and dry.  He may shower.  Change dressing on q.d. basis.   FOLLOW UP:  He is to follow up with Dr. Shelle Iron in 10-14 days.  Call the  office for an appointment.   CONDITION ON DISCHARGE:  Stable.    Roma Schanz, P.A.                   Jene Every, M.D.   CS/MEDQ  D:  03/27/2002  T:  03/27/2002  Job:  [id number]

## 2010-09-02 NOTE — H&P (Signed)
NAME:  [name], [name]                          ACCOUNT NO.:  [id number]   MEDICAL RECORD NO.:  [id number]                   PATIENT TYPE:  INP   LOCATION:  3710                                 FACILITY:  [facility]   PHYSICIAN:  Melissa L. Ladona Ridgel, MD               DATE OF BIRTH:  Apr 12, 1941   DATE OF ADMISSION:  01/23/2003  DATE OF DISCHARGE:                                HISTORY & PHYSICAL   CHIEF COMPLAINT:  1. Change in mental status.  2. Acute gout flare.   HISTORY OF PRESENT ILLNESS:  This is a 70 year old white male who six weeks  ago developed an acute gout flare which resolved over the course of one  week, being treated with colchicine.  On the Monday prior to admission, the  patient again developed right knee and left foot symptoms consistent with  gout.  He was seen by his primary care physician, and his colchicine was  increased to four tablets per day.  On the day prior to admission, the  patient was experiencing copious diarrhea and such pain that he could not  sleep.  He therefore took one temazepam around 2 a.m.  After not sleeping  the rest of the evening, he decided to take a second at 7 a.m.  In the  morning, his wife was concerned and requested that he go to Dr. Izora Ribas  Cassiano's office.  In the office, he was found to be confused, with  terrible gout pain, and was sent to the emergency room.  In the emergency  room, the patient was found to be sonorous, able to be aroused and speaking,  but slightly confused as to what was going on.  His wife stated that during  the night, he was very restless and had auditory hallucinations, thinking  there were a large number of people in the house.   REVIEW OF SYSTEMS:  On the day of admission, revealed intense pain in  multiple joints secondary to gout, severe abdominal pain and diarrhea  secondary to colchicine.  He did deny chest pain, shortness of breath,  nausea or vomiting.  He did not perceive that he was having any  altered  mental status.  With the assistance of his wife, it was determined that he  was slightly confused.  A head CT in the emergency room revealed no acute  bleed.  However, did question the presence of a middle cerebral artery  aneurysm that did not appear to be bleeding.  The patient was therefore  admitted to the telemetry floor for further evaluation.   On further review of systems on this patient, the patient as stated,  complained of abdominal pain, slight nausea which was resolved, chills, no  numbness or tingling, and positive joint pain.  All else were negative.   PAST MEDICAL HISTORY:  1. Gout.  2. Coronary artery disease, status post coronary artery bypass graft x5  status post seven years prior to admission.  3. He had recently had a cardiac workup one year prior to this admission     which was found to have no disease present.  4. Hypertension.  5. Diabetes.   PAST SURGICAL HISTORY:  Back surgery.  He remains on disability secondary to  his back surgery.   SOCIAL HISTORY:  He denies tobacco.  He does drink two mixed drinks per  week.   FAMILY HISTORY:  Father is deceased secondary to coronary artery disease.  Mother is living with multiple medical problems.  Brother has also had  cardiac problems.   ALLERGIES:  CODEINE, unknown reaction.   MEDICATIONS AT HOME:  1. Aspirin 325 mg daily.  2. Colchicine 0.6 mg daily which has been increased to four times per day     during his last gout flare.  3. He takes Coreg 25 mg b.i.d.  4. Pravachol 30 mg daily.  5. He usually takes temazepam 30 mg q.h.s.  6. Altace 5 mg b.i.d.  7. Effexor 75 mg daily.  8. Amaryl 4 mg tablets.  He takes 1-1/2 tablets q. Daily.  9. He takes Pepcid over the counter.  10.      Niaspan 2000 mg at bedtime.  11.      Nortriptyline 25 mg a day.  12.      Bextra 20 mg, one half tablet twice a day with food.  13.      He is also using Colace and multivitamin.   PHYSICAL EXAMINATION:  VITAL  SIGNS:  In the emergency room, his vital signs  were stable.  He was afebrile.  His blood pressure ranged in the lower 110's  to 120's systolically with a pulse of 72.  Respiration rate was 18.  His  saturations were 96% on two liters.  GENERAL:  His mucous membranes were dry.  He was lethargic.  He was able to  converse.  HEENT:  Pupils equal, round and reactive to light. Extraocular muscles were  intact.  There was no pallor.  He was anicteric.  His tympanic membranes  were clear.  His fundoscopy exam revealed no hemorrhages with positive red  reflex.  Unable to appreciate the disk margins.  There were no carotid  bruits and no thyromegaly.  CHEST:  Clear to auscultation.  No rhonchi or rales, no wheezes.  CARDIOVASCULAR:  Exam revealed regular rate and rhythm, positive S1 and S2.  No S3 or S4.  No murmurs rubs, or gallops.  ABDOMEN:  Soft, nontender, nondistended with positive bowel sounds.  There  was no hepatosplenomegaly.  He did have increased borborygmus.  EXTREMITIES:  He had multiple joints with mild erythema and terrible pain,  obvious tophaceous deposits over these joints.  Power was 4/5 secondary to  pain, and his reflexes were symmetrical, but difficult to grade secondary to  pain.  NEUROLOGIC:  He is oriented to person and place, but not time.  He was  forgetful for some of the events.  He denied any active hallucinations.   LABORATORY DATA:  On admission, sodium was 133, potassium 4.0, chloride 98,  CO2 26, BUN 23, creatinine 1.3, and glucose of 143.  White count was 9.1  with hemoglobin of 9.9, hematocrit of 30.1, and platelet count of 173.  His  first set of troponins revealed 0.02 troponin.  CPK total was 208 and MB of  1.4.  His PT was slightly elevated at 42.  His PT, INR was also  slightly  elevated at 16.8 and 1.6.  The rest of the liver panel was within normal  limits.   This is a 70 year old white male who presents with acute gout flare and change in mental  status likely secondary to polypharmacy as the patient had  taken two sleeping pills within a short period of time.   ASSESSMENT/PLAN:  1. Altered mental status.  As stated, the patient had a negative CT scan for     acute bleed in the emergency room.  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  All sleeping medications were     held on admission, and neurologic checks were undertaken q.4h. With vital     signs.  2. Hyperglycemia.  The patient was noted to have a blood glucose of 143 in     the emergency room.  However, on the floor, it was found to be in the     50's.  His Amaryl was held, and he was started on D5 normal saline at 75     cc per hour.  3. Gout.  The patient had been taking excessive amounts of colchicine with     good bowel movements, and yet had no relief from the pain of his gout     flare.  It was determined that with the change in mental status, steroids     would not be the appropriate treatment of choice first line, so his     Effexor was held, aspirin was held, and he was started on Indomethacin at     15 mg with the intent that if prednisone was needed, it could start as     soon as his mental status cleared.  4. Gastroenterology.  Protonix will be started at 40 mg IV b.i.d., and his     Colace will be held.  5. Genitourinary.  Check a UA, C&S in light of his slight temperature and     change in mental status.  6. Psychiatry.  We will continue his Effexor after his mental status clears.  7. Cardiovascular.  We will continue his Coreg and Altace and provide     parameters for nursing to control whether or not he receives the     medications.  His Niaspan will be held at this time secondary to     increasing symptoms of gout which can be exacerbated by Niaspan.  His     Pravachol was also held.  A followup MRI was to be obtained to evaluate     the middle cerebral artery aneurysm noted on CT.                                                 Melissa L. Ladona Ridgel, MD    MLT/MEDQ  D:  01/25/2003  T:  01/25/2003  Job:  [id number]

## 2010-09-20 ENCOUNTER — Encounter: Payer: Self-pay | Admitting: Internal Medicine

## 2010-10-14 ENCOUNTER — Ambulatory Visit (INDEPENDENT_AMBULATORY_CARE_PROVIDER_SITE_OTHER): Payer: Commercial Indemnity | Admitting: Internal Medicine

## 2010-10-14 ENCOUNTER — Encounter: Payer: Self-pay | Admitting: Internal Medicine

## 2010-10-14 DIAGNOSIS — I428 Other cardiomyopathies: Secondary | ICD-10-CM

## 2010-10-14 DIAGNOSIS — E785 Hyperlipidemia, unspecified: Secondary | ICD-10-CM | POA: Insufficient documentation

## 2010-10-14 DIAGNOSIS — I2589 Other forms of chronic ischemic heart disease: Secondary | ICD-10-CM

## 2010-10-14 DIAGNOSIS — I5022 Chronic systolic (congestive) heart failure: Secondary | ICD-10-CM | POA: Insufficient documentation

## 2010-10-14 DIAGNOSIS — I255 Ischemic cardiomyopathy: Secondary | ICD-10-CM | POA: Insufficient documentation

## 2010-10-14 DIAGNOSIS — I509 Heart failure, unspecified: Secondary | ICD-10-CM

## 2010-10-14 LAB — ICD DEVICE OBSERVATION
AL THRESHOLD: 1 V
BATTERY VOLTAGE: 3.1969 V
DEV-0020ICD: NEGATIVE
PACEART VT: 0
RV LEAD THRESHOLD: 0.75 V
TOT-0006: 20111003000000
TZAT-0001ATACH: 1
TZAT-0001ATACH: 2
TZAT-0001ATACH: 3
TZAT-0002ATACH: NEGATIVE
TZAT-0004SLOWVT: 8
TZAT-0005SLOWVT: 88 pct
TZAT-0011SLOWVT: 10 ms
TZAT-0012ATACH: 150 ms
TZAT-0012ATACH: 150 ms
TZAT-0018ATACH: NEGATIVE
TZAT-0018ATACH: NEGATIVE
TZAT-0018ATACH: NEGATIVE
TZAT-0018FASTVT: NEGATIVE
TZAT-0019FASTVT: 8 V
TZAT-0020ATACH: 1.5 ms
TZAT-0020ATACH: 1.5 ms
TZAT-0020FASTVT: 1.5 ms
TZON-0003ATACH: 350 ms
TZON-0003VSLOWVT: 450 ms
TZON-0004VSLOWVT: 20
TZON-0005SLOWVT: 12
TZST-0001ATACH: 4
TZST-0001ATACH: 6
TZST-0001FASTVT: 3
TZST-0001FASTVT: 5
TZST-0001FASTVT: 6
TZST-0001SLOWVT: 2
TZST-0001SLOWVT: 3
TZST-0001SLOWVT: 5
TZST-0002ATACH: NEGATIVE
TZST-0002ATACH: NEGATIVE
TZST-0002ATACH: NEGATIVE
TZST-0002FASTVT: NEGATIVE
TZST-0003SLOWVT: 20 J
TZST-0003SLOWVT: 35 J
TZST-0003SLOWVT: 35 J
VENTRICULAR PACING ICD: 0.08 pct
VF: 0

## 2010-10-14 NOTE — Assessment & Plan Note (Signed)
Stable No change required today  

## 2010-10-14 NOTE — Assessment & Plan Note (Signed)
euvolemic on exam  Normal ICD function See Pace Art report No changes today

## 2010-10-14 NOTE — Patient Instructions (Signed)
Your physician wants you to follow-up in: 3 months with device clinic     Your physician wants you to follow-up in:12 months with DrAllred You will receive a reminder letter in the mail two months in advance. If you don't receive a letter, please call our office to schedule the follow-up appointment.

## 2010-10-14 NOTE — Assessment & Plan Note (Signed)
No ischemic symptoms No changes today 

## 2010-10-14 NOTE — Progress Notes (Signed)
[name] is a pleasant 70 y.o. 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  He has not received ICD therapy.  He had a sprint fidelis lead implanted initially 2006 which was replaced with a Psychologist, occupational (MDT) lead 2011.  The patient reports doing very well since having an ICD implanted and remains very active despite his age.   He plays golf and walks on a treadmill for 30 minutes three times per week.  Today, he  denies symptoms of palpitations, chest pain, shortness of breath, orthopnea, PND, lower extremity edema, dizziness, presyncope, syncope, or neurologic sequela.  The patientis tolerating medications without difficulties and is otherwise without complaint today.   Past Medical History  Diagnosis Date  . Ischemic cardiomyopathy     s/p ICD Implantation by Dr Amil Amen  . Chronic systolic dysfunction of left ventricle     EF 30%  . Diabetes mellitus   . CAD (coronary artery disease)     s/p CABG 1997, PCI (BMS) of SVG to RCA 3/09  . Hyperlipemia   . DJD (degenerative joint disease)   . Gout     Past Surgical History  Procedure Date  . Coronary artery bypass graft 01/1996  . Coronary stent placement 3/09    BMS to SVG to RCA  . Cardiac defibrillator placement 5/06    generator replacement with new RV lead placed by Dr Amil Amen 2011  . Back surgery 2003    History   Social History  . Marital Status: Married    Spouse Name: N/A    Number of Children: N/A  . Years of Education: N/A   Occupational History  . Not on file.   Social History Main Topics  . Smoking status: Never Smoker   . Smokeless tobacco: Not on file  . Alcohol Use: Yes     occasional  . Drug Use: No  . Sexually Active: Not on file   Other Topics Concern  . Not on file   Social History Narrative   Lives in [place], retired Production designer, theatre/television/film for [employer].  He collects antique metal toys    Family History  Problem Relation Age of Onset  . Coronary artery disease      Allergies  Allergen Reactions  . Codeine     Current Outpatient Prescriptions  Medication Sig Dispense Refill  . allopurinol (ZYLOPRIM) 100 MG tablet Take 2 tablets by mouth once daily       . amLODipine-benazepril (LOTREL) 5-20 MG per capsule Take 1 capsule by mouth daily.        Marland Kitchen aspirin 81 MG tablet Take 81 mg by mouth daily.        Marland Kitchen atorvastatin (LIPITOR) 40 MG tablet Take 40 mg by mouth daily.        . carvedilol (COREG) 25 MG tablet Take 25 mg by mouth 2 (two) times daily.        Marland Kitchen eplerenone (INSPRA) 25 MG tablet Take 25 mg by mouth daily.        . furosemide (LASIX) 40 MG tablet Take 40 mg by mouth as needed.        . insulin aspart protamine-insulin aspart (NOVOLOG 70/30) (70-30) 100 UNIT/ML injection Inject 68 Units into the skin as directed.       . Liraglutide (VICTOZA [place]) Inject into the  skin as directed.        . niacin (NIASPAN) 500 MG CR tablet Take 2 tablets by mouth two times a day       . omega-3 acid ethyl esters (LOVAZA) 1 G capsule Take 2 capsules by mouth 2 (two) times daily.        . QUEtiapine (SEROQUEL XR) 200 MG 24 hr tablet Take 200 mg by mouth at bedtime.        . triamcinolone (KENALOG) 0.1 % cream Apply sparingly to affected area externally two times a day as needed       . venlafaxine (EFFEXOR-XR) 150 MG 24 hr capsule Take 150 mg by mouth daily.        Marland Kitchen zolpidem (AMBIEN CR) 12.5 MG CR tablet Take 12.5 mg by mouth at bedtime as needed.        Marland Kitchen DISCONTD: aspirin 325 MG tablet Take 325 mg by mouth daily.        Marland Kitchen DISCONTD: pravastatin (PRAVACHOL) 40 MG tablet Take 40 mg by mouth daily.           Physical Exam: Filed Vitals:   10/14/10 1131  BP: 112/71  Pulse: 77  Resp: 16  Height: 5\' 10"  (1.778 m)  Weight: 238 lb (107.956 kg)    GEN- The patient is well appearing, alert and oriented x 3 today.   Head-  normocephalic, atraumatic Eyes-  Sclera clear, conjunctiva pink Ears- hearing intact Oropharynx- clear Neck- supple, no JVP Lymph- no cervical lymphadenopathy Lungs- Clear to ausculation bilaterally, normal work of breathing Chest- ICD pocket is well healed Heart- Regular rate and rhythm, no murmurs, rubs or gallops, PMI not laterally displaced GI- soft, NT, ND, + BS Extremities- no clubbing, cyanosis, or edema MS- no significant deformity or atrophy Skin- no rash or lesion Psych- euthymic mood, full affect Neuro- strength and sensation are intact  ICD interrogation- reviewed in detail today,  See PACEART report  Assessment and Plan: ROS- all systems are reviewed and negative except as per HPI

## 2010-11-07 ENCOUNTER — Ambulatory Visit
Admission: RE | Admit: 2010-11-07 | Discharge: 2010-11-07 | Disposition: A | Discharge status: Home / Self Care | Payer: Commercial Indemnity | Source: Ambulatory Visit | Attending: Family Medicine | Admitting: Family Medicine

## 2010-11-07 ENCOUNTER — Other Ambulatory Visit: Payer: Self-pay | Admitting: Family Medicine

## 2010-11-07 DIAGNOSIS — R52 Pain, unspecified: Secondary | ICD-10-CM

## 2011-01-09 LAB — BASIC METABOLIC PANEL
Calcium: 9
GFR calc Af Amer: >60
GFR calc non Af Amer: >60
Potassium: 3.7
Sodium: 135

## 2011-01-09 LAB — CBC
MCHC: 33.4
Platelets: 207
RDW: 17 — ABNORMAL HIGH

## 2011-01-11 ENCOUNTER — Encounter: Payer: Self-pay | Admitting: Internal Medicine

## 2011-01-11 ENCOUNTER — Ambulatory Visit (INDEPENDENT_AMBULATORY_CARE_PROVIDER_SITE_OTHER): Payer: Commercial Indemnity | Admitting: *Deleted

## 2011-01-11 DIAGNOSIS — I5022 Chronic systolic (congestive) heart failure: Secondary | ICD-10-CM

## 2011-01-11 DIAGNOSIS — I255 Ischemic cardiomyopathy: Secondary | ICD-10-CM

## 2011-01-11 DIAGNOSIS — I2589 Other forms of chronic ischemic heart disease: Secondary | ICD-10-CM

## 2011-01-11 DIAGNOSIS — I509 Heart failure, unspecified: Secondary | ICD-10-CM

## 2011-01-11 LAB — ICD DEVICE OBSERVATION
AL IMPEDENCE ICD: 456 Ohm
ATRIAL PACING ICD: 0.1 pct
BATTERY VOLTAGE: 3.19 V
DEV-0020ICD: NEGATIVE
TZAT-0001ATACH: 3
TZAT-0001SLOWVT: 1
TZAT-0002FASTVT: NEGATIVE
TZAT-0004SLOWVT: 8
TZAT-0005SLOWVT: 88 pct
TZAT-0011SLOWVT: 10 ms
TZAT-0012ATACH: 150 ms
TZAT-0012ATACH: 150 ms
TZAT-0012ATACH: 150 ms
TZAT-0018FASTVT: NEGATIVE
TZAT-0019ATACH: 6 V
TZAT-0019FASTVT: 8 V
TZAT-0020ATACH: 1.5 ms
TZAT-0020ATACH: 1.5 ms
TZAT-0020FASTVT: 1.5 ms
TZON-0003ATACH: 350 ms
TZON-0003VSLOWVT: 450 ms
TZON-0004SLOWVT: 16
TZST-0001ATACH: 4
TZST-0001ATACH: 6
TZST-0001FASTVT: 5
TZST-0001FASTVT: 6
TZST-0001SLOWVT: 2
TZST-0001SLOWVT: 5
TZST-0002ATACH: NEGATIVE
TZST-0002ATACH: NEGATIVE
TZST-0002FASTVT: NEGATIVE
TZST-0002FASTVT: NEGATIVE
TZST-0002FASTVT: NEGATIVE
TZST-0003SLOWVT: 35 J
TZST-0003SLOWVT: 35 J
VENTRICULAR PACING ICD: 0.1 pct

## 2011-01-16 ENCOUNTER — Encounter: Payer: Self-pay | Admitting: *Deleted

## 2011-02-03 ENCOUNTER — Inpatient Hospital Stay (HOSPITAL_BASED_OUTPATIENT_CLINIC_OR_DEPARTMENT_OTHER)
Admission: RE | Admit: 2011-02-03 | Discharge: 2011-02-03 | Disposition: A | Discharge status: Home / Self Care | Payer: Managed Care, Other (non HMO) | Source: Ambulatory Visit | Attending: Cardiology | Admitting: Cardiology

## 2011-02-03 DIAGNOSIS — I2510 Atherosclerotic heart disease of native coronary artery without angina pectoris: Secondary | ICD-10-CM | POA: Insufficient documentation

## 2011-02-03 DIAGNOSIS — R079 Chest pain, unspecified: Secondary | ICD-10-CM | POA: Insufficient documentation

## 2011-02-03 DIAGNOSIS — I25810 Atherosclerosis of coronary artery bypass graft(s) without angina pectoris: Secondary | ICD-10-CM | POA: Insufficient documentation

## 2011-02-06 ENCOUNTER — Institutional Professional Consult (permissible substitution) (INDEPENDENT_AMBULATORY_CARE_PROVIDER_SITE_OTHER): Payer: Commercial Indemnity | Admitting: Cardiothoracic Surgery

## 2011-02-06 ENCOUNTER — Encounter: Payer: Self-pay | Admitting: Cardiothoracic Surgery

## 2011-02-06 VITALS — BP 106/64 | HR 88 | Resp 20 | Ht 69.0 in | Wt 230.0 lb

## 2011-02-06 DIAGNOSIS — I2589 Other forms of chronic ischemic heart disease: Secondary | ICD-10-CM

## 2011-02-06 DIAGNOSIS — I2510 Atherosclerotic heart disease of native coronary artery without angina pectoris: Secondary | ICD-10-CM

## 2011-02-06 DIAGNOSIS — I255 Ischemic cardiomyopathy: Secondary | ICD-10-CM

## 2011-02-06 NOTE — Progress Notes (Signed)
[address]            [place] [address]          [phone]       [facility] Medical Record #[id number] Date of Birth: 09-Sep-1940  Quintella Reichert, MD Kari Baars, MD, MD  Chief Complaint:    Chief Complaint  Patient presents with  . Coronary Artery Disease     eval for Redo CABG    History of Present Illness:     The patient is to the office at the request of Dr. Mayford Knife after cardiac catheterization done last week. He undergone coronary bypass grafting 02/01/1996 at which time he had CA BG x 5 with the left internal mammary to the LAD, saphenous vein graft to the first and second diagonal, saphenous vein graft to the obtuse marginal,  saphenous vein graft to the right coronary artery. Since that time he's had AICD placed on 2 different occasions. He's had an angioplasty of the right coronary vein graft. Serial imaging showed decline in LV function until now he has ejection fraction of 27%. He notes the recent onset of substernal chest discomfort and exertional shortness of breath and repeat stress test was performed by Dr. Mayford Knife. He comes to the office today for consideration of redo coronary bypass grafting.   Current Activity/ Functional Status: Patient is independent with mobility/ambulation, transfers, ADL's, IADL's.   Past Medical History  Diagnosis Date  . Ischemic cardiomyopathy     s/p ICD Implantation by Dr Amil Amen  . Chronic systolic dysfunction of left ventricle     EF 30%  . Diabetes mellitus   . CAD (coronary artery disease)     s/p CABG 1997, PCI (BMS) of SVG to RCA 3/09  . Hyperlipemia   . DJD (degenerative joint disease)   . Gout     Past Surgical History  Procedure Date  . Coronary stent placement 3/09    BMS to SVG to RCA  . Cardiac defibrillator placement 5/06    generator replacement with new RV lead placed by Dr Amil Amen 2011  . Back surgery 2003  . Coronary artery bypass graft 01/1996    CABG x 5 LIMA to LAD SVG to diag1,2,svg to om,svg to RCA    History  Smoking status  . Never Smoker   Smokeless tobacco  . Not on file    History  Alcohol Use  . 0.6 oz/week  . 1 Cans of beer per week    occasional    History   Social History  . Marital Status: Married    Spouse Name: N/A    Number of Children: N/A  . Years of Education: N/A   Occupational History  . Not on file.   Social History Main Topics  . Smoking status: Never Smoker   . Smokeless tobacco: Not on file  . Alcohol Use: 0.6 oz/week    1 Cans of beer per week     occasional  . Drug Use: No  . Sexually Active: Not on file   Other Topics Concern  . Not on file   Social History Narrative   Lives in [place], retired Production designer, theatre/television/film for [employer].  He collects antique metal toys    Allergies  Allergen Reactions  . Codeine   . Latex Rash    Current Outpatient Prescriptions  Medication Sig  Dispense Refill  . allopurinol (ZYLOPRIM) 100 MG tablet Take 2 tablets by mouth once daily       . aspirin 81 MG tablet Take 325 mg by mouth daily.       Marland Kitchen atorvastatin (LIPITOR) 40 MG tablet Take 40 mg by mouth daily.        . carvedilol (COREG) 25 MG tablet Take 25 mg by mouth 2 (two) times daily.        Marland Kitchen eplerenone (INSPRA) 25 MG tablet Take 25 mg by mouth daily.        . furosemide (LASIX) 40 MG tablet Take 40 mg by mouth as needed.        . insulin aspart protamine-insulin aspart (NOVOLOG 70/30) (70-30) 100 UNIT/ML injection Inject 68 Units into the skin as directed.       . insulin glargine (LANTUS) 100 UNIT/ML injection Inject 40 Units into the skin daily.        . Liraglutide (VICTOZA [place]) Inject into the skin as directed.        . niacin (NIASPAN) 500 MG CR tablet Take 2 tablets by mouth two times a day       . venlafaxine (EFFEXOR-XR) 150 MG 24 hr capsule Take 150 mg by mouth daily.        Marland Kitchen zolpidem (AMBIEN CR) 12.5 MG CR tablet Take 12.5 mg by mouth at bedtime as needed.        Marland Kitchen  amLODipine-benazepril (LOTREL) 5-20 MG per capsule Take 1 capsule by mouth daily.        . colesevelam (WELCHOL) 625 MG tablet Take 1,875 mg by mouth.        . omega-3 acid ethyl esters (LOVAZA) 1 G capsule Take 2 capsules by mouth 2 (two) times daily.        . QUEtiapine (SEROQUEL XR) 200 MG 24 hr tablet Take 200 mg by mouth at bedtime.        . triamcinolone (KENALOG) 0.1 % cream Apply sparingly to affected area externally two times a day as needed          Family History  Problem Relation Age of Onset  . Coronary artery disease     Both his mother and father died at age 46 of unknown causes he does note that his mother had diabetes  Review of Systems:     Cardiac Review of Systems: Y or N  Chest Pain [  Y  ]  Resting SOB [ N  ] Exertional SOB  [ N  ]  Orthopnea [Y  ]   Pedal Edema [Y   ]    Palpitations [ N ] Syncope  Klaus.Mock  ]   Presyncope [  N ]   General Review of Systems:   Constitional: recent weight change [ n ]; anorexia [n  ]; fatigue [ n ]; nausea [n  ];  night sweats [ n ],fever[n] or chills[ n ]                                                      Eye : No visual changes or amaurosis  Resp: Denies hemoptysis he does have exertional shortness of breath but no resting shortness of breath      GI:  He notes nocturia x1 no blood in stool or urine.  Skin: He notes a rash with previous surgery and tape   Musculosketetal: Denies any joint pain   Heme/Lymph: Denies easy bruisability  Neuro: Denies any TIAs  Psych: Denies psychiatric history  Endocrine: diabetes,   Immunizations: He is unsure of his immunizations  Other: Negative     Physical Exam: BP 106/64  Pulse 88  Resp 20  Ht 5\' 9"  (1.753 m)  Wt 230 lb (104.327 kg)  BMI 33.96 kg/m2  SpO2 96%  General appearance: alert, cooperative and appears stated age Neurologic: intact Heart: regular rate and rhythm, S1, S2 normal, no murmur, click, rub or gallop, normal apical impulse and prominent apical impulse Lungs:  clear to auscultation bilaterally and normal percussion bilaterally Abdomen: soft, non-tender; bowel sounds normal; no masses,  no organomegaly Extremities: extremities normal, atraumatic, no cyanosis or edema, Homans sign is negative, no sign of DVT, no edema, redness or tenderness in the calves or thighs and no ulcers, gangrene or trophic changes   the patient is left-handed, Previous vein was harvested completely from the left leg no incisions in the right leg   Diagnostic Studies & Laboratory data:      Recent Radiology Findings:   No results found.    Recent Lab Findings: Lab Results  Component Value Date   WBC 13.7* 05/16/2009   HGB 15.1 05/16/2009   HCT 46.2 05/16/2009   PLT 240 05/16/2009   GLUCOSE 232* 05/16/2009   ALT 16 05/16/2009   AST 24 05/16/2009   NA 141 05/16/2009   K 4.8 05/16/2009   CL 102 05/16/2009   CREATININE 1.3 05/16/2009   BUN 27* 05/16/2009   CO2 26 05/16/2009       BNP   103   Cath: RESULTS:   1. Left main coronary artery is widely patent in the proximal and       midportion, but in the distal portion there is an 80% narrowing.       The vessel then bifurcates in the left anterior descending artery       and left circumflex artery.   2. Left anterior descending artery is patent in its proximal origin       giving rise to a first diagonal branch which is small but patent.       The LAD is then occluded.   3. The left circumflex is patent in its proximal portion giving rise       to a small first obtuse marginal branch and a second obtuse       marginal branch.  At the takeoff of the second obtuse marginal,       there is an 80% narrowing of the left circumflex.  The ongoing left       circumflex traverses the AV groove and gives rise to a large third       obtuse marginal branch.   4. The right coronary artery is occluded proximally.   5. LIMA to the LAD is widely patent with evidence of distal LAD flow       with a widely patent distal LAD.  There is  evidence of left-to-left       collaterals filling the second diagonal.  The saphenous vein graft       to the diagonal is occluded.  The saphenous vein graft to the       obtuse marginal is occluded.  The saphenous vein graft to the right       coronary artery has an 80%  lesion proximal to the insertion site in       the right coronary artery.      Left ventriculography shows severe LV dysfunction, EF 20-25%.  LVEDP 15   mmHg.  Aortic pressure 98/55 mmHg, LV pressure 100/15 mmHg.      ASSESSMENT:   1. Severe 3-vessel coronary disease.   2. Left internal mammary artery to the left anterior descending       patent.  Saphenous vein graft to the right coronary artery with 80%       lesion prior to the insertion into the distal right coronary       artery, saphenous vein graft to the diagonal occluded, saphenous       vein graft to the obtuse marginal occluded.   3. Severe left ventricular dysfunction, ejection fraction 20-25%.     Lexiscan 01/26/2011 [place]:EF 27%, Marked Perfusion de defect is seen in the basal inferior lateral and mild mid inferior lateral regions. Moderate to severe perfusion defect is seen in the basal inferior septal basal inferior mid inferior septal mid inferior and apical inferior region consistent with scar there is mild perfusion defect due to scar with mild peri-infarct ischemia in the basal anterior and mid anterior regions.    Last ECHO 05/04/2009    Assessment / Plan:      Patient is seen in the office with history of coronary artery bypass grafting x5, patient is now having symptoms of angina. It appears that his recent stress test is more positive than previous last year. He has critical coronary anatomy with with areas of the lateral and inferior wall jeopardized. The current anatomy would make angioplasty difficult, the downside is risk of redo surgery and significant LV dysfunction with significant inferior scar. I discussed with the patient and his wife  the risk of redo operation versus medical treatment. At this point we are considering redo coronary artery bypass grafting with at least graft to the right and obtuse marginal. The patient and his wife are aware of the increased risk of bypass surgery in the current setting because of his poor LV function and reduce status. However with recurrent symptoms and critical anatomy his options are limited.  He has not had an echo cardiogram since January of 2011 this will be repeated as will Doppler studies and PFTs and I will see him in the office to further discuss and plan surgery.      Delight Ovens MD 02/06/2011 5:57 PM

## 2011-02-06 NOTE — Patient Instructions (Signed)
Decrease ASA to 81mg  daily Stop fish oil    Coronary Artery Bypass Grafting Coronary artery bypass grafting (CABG) is done to bypass or fix arteries of the heart (coronary) that have become narrow or blocked. This is usually the result of plaque built up in the walls of the vessels. The coronary arteries supply the heart with the oxygen and nutrients it needs to pump blood to your body. The heart never rests and needs constant blood flow. If an artery is partially blocked, you may have chest pain (angina). Lack of blood flow to part of the heart muscle may cause that part to die. This is what happens in a heart attack (myocardial infarction). Reasons for CABG include:  Arteries that cannot be treated with medications or other interventions (such as a heart stent).   Severe angina not responsive to other treatment.   Improving heart function.   Treating a heart attack.  Every person is unique. Be sure you understand the risks and benefits of CABG.  RISKS AND COMPLICATIONS Your surgeon will discuss these with you. Your risks will be different depending on your past and present health and other factors. It may be helpful to have a family member or advocate with you so you feel free to ask questions and get the answers you need to give an informed consent. Possible problems of CABG surgery include:  Blood loss and replacement.   Stroke.   Infection.   Surgical site pain.   Heart attack during or after.   Kidney failure.  BEFORE THE PROCEDURE  Tell your doctor about any allergies, medicines, bleeding problems and other surgeries.   Take all medicines exactly as directed. You may start new medicines and stop taking others. Do not stop medications or adjust dosages on your own. Continue taking the medications up until the time of surgery.   Perform only activities that are suggested by your caregiver.   If you are overweight, you should try to lose weight. Eat a heart-healthy diet  that is low in fat and salt.   If you smoke, quit.   Let your caregiver know if you have been on steroids (including creams or drops) for long periods of time. This is critical.   If you are diabetic discuss with your doctor whether or not you should take insulin the day of your surgery.  DAY OF SURGERY:  Plan to arrive 60 minutes before the scheduled time or as directed.   Do not eat or drink anything, including medicine, unless directed.   You will sign a written consent. You may have blood work and other tests done.   Your family will be shown where they can wait for the surgeon to talk to them when the surgery is over.  PROCEDURE Only a specially trained surgeon does a CABG assisted by a team of other health care professionals. You will be asleep and not feel any discomfort during the surgery.   Traditional method:   A cut (incision) is made down the front of the chest through the breastbone (sternum).   The sternum is spread open so the surgeon can see your heart.   You are connected to a machine that does the work of your heart and lungs and your heart stops beating.   Veins are taken from your leg(s) and used to bypass the blocked arteries of your heart. Sometimes an artery from inside your chest wall is used, either by itself or along with leg veins.   When  the bypasses are done, you are taken off the machine, and your heart is restarted and takes over again.   Alternate methods:   The heart lung machine is not used. This is called off-pump. Your heart continues to beat while the bypasses are done.   Smaller incisions are used instead of going through the middle of the chest (minimally invasive).   Intended to reduce pain and promote a faster recovery.  AFTER THE PROCEDURE  You may wake up with a tube in your throat to help your breathing. You may be connected to a breathing machine.   You will not be able to talk while the tube is in place. Try not to fight against  it. The tube will be taken out as soon as it is safe.   Even if you cannot see them, there are nurses nearby who are watching everything. You are not alone.   Your family should be prepared to see you with many tubes and wires. You will be sleepy and pale. Your family can hold your hand and speak to you, but you may have no memory of this time.  SEEK IMMEDIATE MEDICAL CARE IF:  You have severe chest pain, especially if the pain is crushing or pressure-like and spreads to the arms, back, neck, or jaw, or if you have sweating, feel sick to your stomach (nausea), or shortness of breath. THIS IS AN EMERGENCY. Do not wait to see if the pain will go away. Get medical help at once. Call your local emergency services (911 in [place] DO NOT drive yourself to the hospital.   You have an attack of chest pain lasting longer than usual, despite rest and treatment with the medications your doctor has prescribed.   You wake from sleep with chest pain.   You feel dizzy or faint.  Document Released: 01/11/2005 Document Revised: 12/14/2010 Document Reviewed: 09/18/2007 [facility] Patient Information 2012 [place], [place].

## 2011-02-08 ENCOUNTER — Other Ambulatory Visit: Payer: Self-pay | Admitting: Cardiothoracic Surgery

## 2011-02-08 DIAGNOSIS — I2510 Atherosclerotic heart disease of native coronary artery without angina pectoris: Secondary | ICD-10-CM

## 2011-02-09 ENCOUNTER — Ambulatory Visit (HOSPITAL_COMMUNITY)
Admission: RE | Admit: 2011-02-09 | Discharge: 2011-02-09 | Disposition: A | Discharge status: Home / Self Care | Payer: Managed Care, Other (non HMO) | Source: Ambulatory Visit | Attending: Cardiothoracic Surgery | Admitting: Cardiothoracic Surgery

## 2011-02-09 DIAGNOSIS — Z01810 Encounter for preprocedural cardiovascular examination: Secondary | ICD-10-CM

## 2011-02-09 DIAGNOSIS — Z95810 Presence of automatic (implantable) cardiac defibrillator: Secondary | ICD-10-CM | POA: Insufficient documentation

## 2011-02-09 DIAGNOSIS — R079 Chest pain, unspecified: Secondary | ICD-10-CM | POA: Insufficient documentation

## 2011-02-09 DIAGNOSIS — Z01811 Encounter for preprocedural respiratory examination: Secondary | ICD-10-CM | POA: Insufficient documentation

## 2011-02-09 DIAGNOSIS — R0609 Other forms of dyspnea: Secondary | ICD-10-CM | POA: Insufficient documentation

## 2011-02-09 DIAGNOSIS — I2510 Atherosclerotic heart disease of native coronary artery without angina pectoris: Secondary | ICD-10-CM

## 2011-02-09 DIAGNOSIS — R0989 Other specified symptoms and signs involving the circulatory and respiratory systems: Secondary | ICD-10-CM | POA: Insufficient documentation

## 2011-02-09 NOTE — Cardiovascular Report (Signed)
[name], [name]                ACCOUNT NO.:  [id number]  MEDICAL RECORD NO.:  [id number]  LOCATION:                                 FACILITY:  PHYSICIAN:  Armanda Magic, M.D.     DATE OF BIRTH:  03-05-1941  DATE OF PROCEDURE:  02/03/2011 DATE OF DISCHARGE:                           CARDIAC CATHETERIZATION   PROCEDURE:  Left heart catheterization, coronary angiography, left ventriculography.  OPERATOR:  Armanda Magic, MD  INDICATIONS:  Chest pain with history of coronary artery disease.  COMPLICATIONS:  None.  IV ACCESS:  Via right femoral artery 4-French sheath.  IV MEDICATIONS:  Versed 1 mg, fentanyl 25 mcg.  This is a 70 year old male with a history of coronary artery bypass grafting with LIMA to the LAD, saphenous vein graft to the diagonal, saphenous vein graft to obtuse marginal, and saphenous vein graft to the RCA with PCI of the saphenous vein graft to the RCA who presented with episodes of chest pain.  Nuclear stress test showed a large area of infarct in the inferior wall with a fixed defect.  There was also a reversible defect in the mid anterior wall.  EF had declined from 41% in 2009 to now 27%.  The patient now presents for cardiac catheterization.  The patient is brought to cardiac catheterization laboratory in a fasting, nonsedated state.  Informed consent was obtained.  The patient was connected to continuous heart rate and pulse oximetry monitoring and blood pressure monitoring.  The right groin was prepped and draped in sterile fashion.  Xylocaine 1% was used for local anesthesia.  Using modified Seldinger technique, a 4-French sheath was placed in the right femoral artery.  Under fluoroscopic guidance, a 4-French JL-4 catheter was placed in the left coronary artery.  Multiple cine films were taken at 30 degree RAO, 40 degree LAO views.  This catheter was then exchanged out over a guidewire for a 4-French 3D RCA catheter which successfully engaged  the right coronary ostium.  Multiple cine films were taken at 30 degree RAO, 40 degree LAO views.  This catheter was then pulled back and engaged the left subclavian artery.  The catheter was exchanged out over a long exchange wire for a 4-French LIMA catheter.  LIMA engaged the ostium of the left internal mammary artery and coronary angiography was performed in the straight lateral and 30 degree RAO views.  The catheter was then removed over a guidewire and under fluoroscopic guidance, a 4- French RBC catheter was placed into the saphenous vein graft to the right coronary artery.  Multiple cine films were taken at 30 degree RAO, 40 degree LAO views.  This catheter was then pulled back into the graft to the saphenous vein graft to the obtuse marginal.  Multiple cine films were taken at 30 degree RAO, 40 degree LAO views.  Catheter was then exchanged out over a guidewire for a 4-French LCD catheter, which successfully engaged the saphenous vein graft to the diagonal.  Multiple cine films were taken at 30 degree RAO, 40 degree LAO views.  Catheterwas then exchanged out over a guidewire for a 4-French angled pigtail catheter which was placed under fluoroscopic guidance in the  left ventricular cavity.  Left ventriculography was performed in the 30 degree RAO view using total of 25 mL of contrast at 12 mL/second.  The catheter was then pulled back across the aortic valve with no significant gradient noted.  At the end of procedure, all catheters and sheaths were removed.  Manual compression was performed till adequate hemostasis was obtained.  The patient was transferred back to room in stable condition.  RESULTS: 1. Left main coronary artery is widely patent in the proximal and     midportion, but in the distal portion there is an 80% narrowing.     The vessel then bifurcates in the left anterior descending artery     and left circumflex artery. 2. Left anterior descending artery is patent  in its proximal origin     giving rise to a first diagonal branch which is small but patent.     The LAD is then occluded. 3. The left circumflex is patent in its proximal portion giving rise     to a small first obtuse marginal branch and a second obtuse     marginal branch.  At the takeoff of the second obtuse marginal,     there is an 80% narrowing of the left circumflex.  The ongoing left     circumflex traverses the AV groove and gives rise to a large third     obtuse marginal branch. 4. The right coronary artery is occluded proximally. 5. LIMA to the LAD is widely patent with evidence of distal LAD flow     with a widely patent distal LAD.  There is evidence of left-to-left     collaterals filling the second diagonal.  The saphenous vein graft     to the diagonal is occluded.  The saphenous vein graft to the     obtuse marginal is occluded.  The saphenous vein graft to the right     coronary artery has an 80% lesion proximal to the insertion site in     the right coronary artery.  Left ventriculography shows severe LV dysfunction, EF 20-25%.  LVEDP 15 mmHg.  Aortic pressure 98/55 mmHg, LV pressure 100/15 mmHg.  ASSESSMENT: 1. Severe 3-vessel coronary disease. 2. Left internal mammary artery to the left anterior descending     patent.  Saphenous vein graft to the right coronary artery with 80%     lesion prior to the insertion into the distal right coronary     artery, saphenous vein graft to the diagonal occluded, saphenous     vein graft to the obtuse marginal occluded. 3. Severe left ventricular dysfunction, ejection fraction 20-25%.  PLAN:  Discharged to home after IV fluid and bedrest are complete.  He will be consulted by CVTS in the office next week.  I have talked to Dr. Dorris Fetch who is going to arrange office followup with Dr. Tyrone Sage who did his initial CABG to evaluate for possible redo CABG.  He will follow up with my nurse practitioner in 2 weeks for groin  check.     Armanda Magic, M.D.     TT/MEDQ  D:  02/05/2011  T:  02/06/2011  Job:  [id number]  cc:   Sheliah Plane, MD  Electronically Signed by Armanda Magic M.D. on 02/09/2011 02:13:02 PM

## 2011-02-14 DIAGNOSIS — E119 Type 2 diabetes mellitus without complications: Secondary | ICD-10-CM

## 2011-02-14 DIAGNOSIS — I10 Essential (primary) hypertension: Secondary | ICD-10-CM

## 2011-02-14 DIAGNOSIS — I2510 Atherosclerotic heart disease of native coronary artery without angina pectoris: Secondary | ICD-10-CM | POA: Insufficient documentation

## 2011-02-14 HISTORY — DX: Type 2 diabetes mellitus without complications: E11.9

## 2011-02-14 HISTORY — DX: Essential (primary) hypertension: I10

## 2011-02-15 ENCOUNTER — Ambulatory Visit: Payer: Managed Care, Other (non HMO) | Admitting: Cardiothoracic Surgery

## 2011-02-16 ENCOUNTER — Encounter: Payer: Self-pay | Admitting: Cardiothoracic Surgery

## 2011-02-16 ENCOUNTER — Ambulatory Visit (INDEPENDENT_AMBULATORY_CARE_PROVIDER_SITE_OTHER): Payer: Managed Care, Other (non HMO) | Admitting: Cardiothoracic Surgery

## 2011-02-16 VITALS — BP 110/60 | HR 80 | Ht 67.5 in | Wt 236.8 lb

## 2011-02-16 DIAGNOSIS — I2510 Atherosclerotic heart disease of native coronary artery without angina pectoris: Secondary | ICD-10-CM

## 2011-02-16 NOTE — Progress Notes (Signed)
[address]            [place] [address]          [phone]       [name] [facility] Medical Record #[id number] Date of Birth: 1940/07/28  PCP :Kari Baars, MD Cardiology: Armanda Magic M.D. Endocrinologist: Drinda Butts M.D.  Chief Complaint:    Chief Complaint  Patient presents with  . Coronary Artery Disease    History of Present Illness:     The patient returns to the office today to further discuss redo coronary artery bypass grafting. Since last seen he denies any further episodes of angina but has curtailed strenuous activity. He saw Dr. Lucianne Muss today for further refinement of his diabetes control.      Past Medical History  Diagnosis Date  . Ischemic cardiomyopathy     s/p ICD Implantation by Dr Amil Amen  . Chronic systolic dysfunction of left ventricle     EF 30%  . Diabetes mellitus   . CAD (coronary artery disease)     s/p CABG 1997, PCI (BMS) of SVG to RCA 3/09  . Hyperlipemia   . DJD (degenerative joint disease)   . Gout   . HTN (hypertension) 02/14/2011  . DM (diabetes mellitus) 02/14/2011    Past Surgical History  Procedure Date  . Coronary stent placement 3/09    BMS to SVG to RCA  . Cardiac defibrillator placement 5/06    generator replacement with new RV lead placed by Dr Amil Amen 2011  . Back surgery 2003  . Coronary artery bypass graft 01/1996    CABG x 5 LIMA to LAD SVG to diag1,2,svg to om,svg to RCA    History  Smoking status  . Never Smoker          History  Alcohol Use     . 1 Cans of beer per week    occasional    History   Social History  . Marital Status: Married    Spouse Name: N/A    Number of Children: N/A  . Years of Education: N/A   Occupational History  . Retired Psychologist, counselling   Social History Main Topics  . Smoking status: Never Smoker   . Smokeless tobacco: Not on file  . Alcohol Use: 0.6 oz/week    1 Cans of beer per week     occasional   Social  History Narrative   Lives in [place], retired Production designer, theatre/television/film for [employer].  He collects antique metal toys    Allergies  Allergen Reactions  . Codeine   . Latex Rash    Current Outpatient Prescriptions  Medication Sig Dispense Refill  . allopurinol (ZYLOPRIM) 100 MG tablet Take 2 tablets by mouth once daily       . amLODipine-benazepril (LOTREL) 5-20 MG per capsule Take 1 capsule by mouth daily.        Marland Kitchen aspirin 81 MG tablet Take 325 mg by mouth daily.       Marland Kitchen atorvastatin (LIPITOR) 40 MG tablet Take 40 mg by mouth daily.        . carvedilol (COREG) 25 MG tablet Take 25 mg by mouth 2 (two) times daily.        . colesevelam (WELCHOL) 625 MG tablet Take 1,875 mg by mouth.        Marland Kitchen eplerenone (INSPRA) 25 MG  tablet Take 25 mg by mouth daily.        . furosemide (LASIX) 40 MG tablet Take 40 mg by mouth as needed.        . insulin glargine (LANTUS) 100 UNIT/ML injection Inject 65 Units into the skin at bedtime.       . insulin glulisine (APIDRA) 100 UNIT/ML injection Inject 30 Units into the skin 3 (three) times daily before meals.        . Liraglutide (VICTOZA [place]) Inject into the skin as directed.        . niacin (NIASPAN) 500 MG CR tablet Take 2 tablets by mouth two times a day       . triamcinolone (KENALOG) 0.1 % cream Apply sparingly to affected area externally two times a day as needed       . venlafaxine (EFFEXOR-XR) 150 MG 24 hr capsule Take 150 mg by mouth daily.        Marland Kitchen zolpidem (AMBIEN CR) 12.5 MG CR tablet Take 12.5 mg by mouth at bedtime as needed.              Marland Kitchen omega-3 acid ethyl esters (LOVAZA) 1 G capsule Take 2 capsules by mouth 2 (two) times daily.        . QUEtiapine (SEROQUEL XR) 200 MG 24 hr tablet Take 200 mg by mouth at bedtime.           Family History  Problem Relation Age of Onset  . Coronary artery disease       Review of Systems:     Cardiac Review of Systems: Y or N  Chest Pain [ y   ]  Resting SOB [  n ] Exertional SOB  Cove.Etienne  ]    Orthopnea [ n ]   Pedal Edema [n   ]    Palpitations [ n ] Syncope  [n  ]   Presyncope [n   ]  General Review of Systems: [Y] = yes [  ]=no Constitional: recent weight change [ y ]; anorexia [  ]; fatigue [  ]; nausea [  ]; night sweats [  ]; fever [  ]; or chills [  ];                                                                                                                                          Dental: poor dentition[  ]  Eye : blurred vision [  ]; diplopia [   ]; vision changes [  ];  Amaurosis fugax[  ]; Resp: cough [  ];  wheezing[  ];  hemoptysis[  ]; shortness of breath[  ]; paroxysmal nocturnal dyspnea[  ]; dyspnea on exertion[  ]; or orthopnea[  ];  GI:  gallstones[  ], vomiting[  ];  dysphagia[  ]; melena[  ];  hematochezia [  ]; heartburn[  ];  Hx of  Colonoscopy[  ]; GU: kidney stones [  ]; hematuria[  ];   dysuria [  ];  nocturia[y  ];  history of     obstruction [ n ];                 Skin: rash, swelling[  ];, hair loss[  ];  peripheral edema[  ];  or itching[  ]; Musculosketetal: myalgias[  ];  joint swelling[  ];  joint erythema[  ];  joint pain[  ];  back pain[  ];  Heme/Lymph: bruising[  ];  bleeding[  ];  anemia[  ];  Neuro: TIA[  ];  headaches[  ];  stroke[  ];  vertigo[  ];  seizures[  ];   paresthesias[  ];  difficulty walking[  ];  Psych:depression[  ]; anxiety[  ];  Endocrine: diabetes[  ];  thyroid dysfunction[  ];  Immunizations: Flu [  ]; Pneumococcal[  ];  Other:  Physical Exam: BP 110/60  Pulse 80  Ht 5' 7.5" (1.715 m)  Wt 236 lb 12.8 oz (107.412 kg)  BMI 36.54 kg/m2  General appearance: alert, cooperative and appears stated age Neurologic: intact Heart: regular rate and rhythm, S1, S2 normal, no murmur, click, rub or gallop, normal apical impulse and no rub Lungs: clear to auscultation bilaterally and normal percussion bilaterally Abdomen: soft, non-tender; bowel sounds normal; no masses,  no organomegaly Extremities: extremities normal,  atraumatic, no cyanosis or edema, Homans sign is negative, no sign of DVT, no edema, redness or tenderness in the calves or thighs and no ulcers, gangrene or trophic changes Wound: patient is left handed, Previos vein harvested from the left leg no incision on the right leg.   Diagnostic Studies & Laboratory data:     Recent Radiology Findings:    Patient had the pulmonary function studies done October 25 10 showing an FEV1 of 2.91 98% of predicted with diffusion capacity 80% of predicted.  [facility] but was a suboptimal study patient confirms the known decrease in LV function with at least moderate LV dysfunction no significant valvular abnormalities were noted mitral valve is noted to be mildly thickened with trivial regurgitation. Carotid Doppler studies showed mild calcific plaque in the origin of the ICA without stenosis Doppler flow in the right palmar arch remain normal with radial compression and obliterates with ulnar compression the left palmar arch showed 50% reduction with radial compression  remained normal with ulnar compression.  Recent Lab Findings: Lab Results  Component Value Date   WBC 13.7* 05/16/2009   HGB 15.1 05/16/2009   HCT 46.2 05/16/2009   PLT 240 05/16/2009   GLUCOSE 232* 05/16/2009   ALT 16 05/16/2009   AST 24 05/16/2009   NA 141 05/16/2009   K 4.8 05/16/2009   CL 102 05/16/2009   CREATININE 1.3 05/16/2009   BUN 27* 05/16/2009   CO2 26 05/16/2009      Assessment / Plan:   I have further discussed to redo coronary artery bypass grafting with the patient preserving the left internal mammary and replacing the vein graft to the posterior descending and circumflex. The patient and his wife are aware that this is procedure of the increased risk because of his redo status and poor LV function. Further attempts at angioplasty of the disease right vein graft and/or angioplasty of the native stenotic vessel supplying the circumflex are unlikely to be  successful.  The goals risks and alternatives of the planned  surgical procedure redo coronary artery bypass grafting  have been discussed with the patient in detail. The risks of the procedure including death, infection, stroke, myocardial infarction, bleeding, blood transfusion have all been discussed specifically.  I have quoted Lowella Dandy a 5% of perioperative mortality and a complication rate as high as 25 %. The patient's questions have been answered.[name] is willing  to proceed with the planned procedure. We tented the plan to proceed on November 19.      Delight Ovens MD 02/16/2011 5:06 PM

## 2011-02-24 ENCOUNTER — Other Ambulatory Visit: Payer: Self-pay

## 2011-02-24 DIAGNOSIS — I2510 Atherosclerotic heart disease of native coronary artery without angina pectoris: Secondary | ICD-10-CM

## 2011-02-28 ENCOUNTER — Encounter (HOSPITAL_COMMUNITY): Payer: Self-pay | Admitting: Pharmacy Technician

## 2011-03-02 ENCOUNTER — Other Ambulatory Visit: Payer: Self-pay

## 2011-03-02 ENCOUNTER — Encounter (HOSPITAL_COMMUNITY)
Admission: RE | Admit: 2011-03-02 | Discharge: 2011-03-02 | Disposition: A | Discharge status: Home / Self Care | Payer: Managed Care, Other (non HMO) | Source: Ambulatory Visit | Attending: Cardiothoracic Surgery | Admitting: Cardiothoracic Surgery

## 2011-03-02 ENCOUNTER — Encounter (HOSPITAL_COMMUNITY): Payer: Self-pay

## 2011-03-02 ENCOUNTER — Ambulatory Visit (HOSPITAL_COMMUNITY)
Admission: RE | Admit: 2011-03-02 | Discharge: 2011-03-02 | Disposition: A | Discharge status: Home / Self Care | Payer: Managed Care, Other (non HMO) | Source: Ambulatory Visit | Attending: Cardiothoracic Surgery | Admitting: Cardiothoracic Surgery

## 2011-03-02 DIAGNOSIS — Z01810 Encounter for preprocedural cardiovascular examination: Secondary | ICD-10-CM | POA: Insufficient documentation

## 2011-03-02 DIAGNOSIS — I2510 Atherosclerotic heart disease of native coronary artery without angina pectoris: Secondary | ICD-10-CM

## 2011-03-02 DIAGNOSIS — Z01812 Encounter for preprocedural laboratory examination: Secondary | ICD-10-CM | POA: Insufficient documentation

## 2011-03-02 DIAGNOSIS — Z01818 Encounter for other preprocedural examination: Secondary | ICD-10-CM | POA: Insufficient documentation

## 2011-03-02 HISTORY — DX: Depression, unspecified: F32.A

## 2011-03-02 HISTORY — DX: Major depressive disorder, single episode, unspecified: F32.9

## 2011-03-02 LAB — COMPREHENSIVE METABOLIC PANEL
ALT: 22 U/L (ref 0–53)
AST: 21 U/L (ref 0–37)
Albumin: 3.8 g/dL (ref 3.5–5.2)
Alkaline Phosphatase: 76 U/L (ref 39–117)
BUN: 13 mg/dL (ref 6–23)
CO2: 28 mEq/L (ref 19–32)
Calcium: 9.8 mg/dL (ref 8.4–10.5)
Chloride: 100 mEq/L (ref 96–112)
Creatinine, Ser: 1.04 mg/dL (ref 0.50–1.35)
GFR calc Af Amer: 83 mL/min — ABNORMAL LOW (ref >90)
GFR calc non Af Amer: 72 mL/min — ABNORMAL LOW (ref >90)
Glucose, Bld: 257 mg/dL — ABNORMAL HIGH (ref 70–99)
Potassium: 3.9 mEq/L (ref 3.5–5.1)
Sodium: 139 mEq/L (ref 135–145)
Total Bilirubin: 0.4 mg/dL (ref 0.3–1.2)
Total Protein: 7.4 g/dL (ref 6.0–8.3)

## 2011-03-02 LAB — URINALYSIS, ROUTINE W REFLEX MICROSCOPIC
Bilirubin Urine: NEGATIVE
Glucose, UA: >1000 mg/dL — AB
Hgb urine dipstick: NEGATIVE
Ketones, ur: NEGATIVE mg/dL
Leukocytes, UA: NEGATIVE
Nitrite: NEGATIVE
Protein, ur: NEGATIVE mg/dL
Specific Gravity, Urine: 1.022 (ref 1.005–1.030)
Urobilinogen, UA: 1 mg/dL (ref 0.0–1.0)
pH: 6.5 (ref 5.0–8.0)

## 2011-03-02 LAB — BLOOD GAS, ARTERIAL
Acid-Base Excess: 2.6 mmol/L — ABNORMAL HIGH (ref 0.0–2.0)
Bicarbonate: 26.4 mEq/L — ABNORMAL HIGH (ref 20.0–24.0)
Drawn by: 344381
FIO2: 0.21 %
O2 Saturation: 93.5 %
Patient temperature: 98.6
TCO2: 27.6 mmol/L (ref 0–100)
pCO2 arterial: 39.2 mmHg (ref 35.0–45.0)
pH, Arterial: 7.443 (ref 7.350–7.450)
pO2, Arterial: 66.9 mmHg — ABNORMAL LOW (ref 80.0–100.0)

## 2011-03-02 LAB — HEMOGLOBIN A1C
Hgb A1c MFr Bld: 8.9 % — ABNORMAL HIGH (ref <5.7)
Mean Plasma Glucose: 209 mg/dL — ABNORMAL HIGH (ref <117)

## 2011-03-02 LAB — CBC
HCT: 46.1 % (ref 39.0–52.0)
Hemoglobin: 15.1 g/dL (ref 13.0–17.0)
MCH: 30 pg (ref 26.0–34.0)
MCHC: 32.8 g/dL (ref 30.0–36.0)
MCV: 91.7 fL (ref 78.0–100.0)
Platelets: 222 10*3/uL (ref 150–400)
RBC: 5.03 MIL/uL (ref 4.22–5.81)
RDW: 14.3 % (ref 11.5–15.5)
WBC: 8.8 10*3/uL (ref 4.0–10.5)

## 2011-03-02 LAB — URINE MICROSCOPIC-ADD ON

## 2011-03-02 LAB — SURGICAL PCR SCREEN
MRSA, PCR: NEGATIVE
Staphylococcus aureus: NEGATIVE

## 2011-03-02 LAB — APTT: aPTT: 28 seconds (ref 24–37)

## 2011-03-02 LAB — ABO/RH: ABO/RH(D): A POS

## 2011-03-02 LAB — PROTIME-INR
INR: 1.05 (ref 0.00–1.49)
Prothrombin Time: 13.9 seconds (ref 11.6–15.2)

## 2011-03-02 MED ORDER — CHLORHEXIDINE GLUCONATE 4 % EX LIQD: 30.0000 mL | CUTANEOUS | Status: DC

## 2011-03-02 NOTE — Progress Notes (Signed)
PERIOPERATIVE ORDER FOR AICD FAXED TO [facility], [facility] CONTACTED TO CALL BACK REGUARDING TURNING OFF AICD AM OF SURGERY.  PT. HAD DOPPLER STUDIES AND PFT DONE APPROX 3 WEEKS AGO WILL FAX DOPPLER STUDIES AND TUBE PFT'S TO [facility].

## 2011-03-02 NOTE — Pre-Procedure Instructions (Signed)
20 SOLLIE VULTAGGIO  03/02/2011   Your procedure is scheduled on: 02-27-2011   Report to [facility] at 5:30 am am AM.  Call this number if you have problems the morning of surgery: [phone]   Remember:   Do not eat food:After Midnight.  Do not drink clear liquids: 4 Hours before arrival. CLEAR LIQUIDS ARE TEA, SODA, BLACK COFFEE,APPLE JUICE, GRAPE JUICE  BEFORE 1:30 am NOTHING AFTER  1:30 am  Take these medicines the morning of surgery with A SIP OF WATER   Do not wear jewelry, make-up or nail polish.  Do not wear lotions, powders, or perfumes. You may wear deodorant.  Do not shave 48 hours prior to surgery.  Do not bring valuables to the hospital.  Contacts, dentures or bridgework may not be worn into surgery.  Leave suitcase in the car. After surgery it may be brought to your room.  For patients admitted to the hospital, checkout time is 11:00 AM the day of discharge.   Patients discharged the day of surgery will not be allowed to drive home.  Name and phone number of your driver:  Special Instructions: Incentive Spirometry - Practice and bring it with you on the day of surgery. and CHG Shower Use Special Wash: 1/2 bottle night before surgery and 1/2 bottle morning of surgery.   Please read over the following fact sheets that you were given: Blood Transfusion Information, Open Heart Packet, MRSA Information and Surgical Site Infection Prevention

## 2011-03-02 NOTE — Progress Notes (Signed)
SPOKE WITH JULIE @ [facility] INFORMED OF PT. SURGERY 03-06-2011 AND NEED TO HAVE AICD TURNED OFF.

## 2011-03-02 NOTE — Pre-Procedure Instructions (Signed)
20 [name]  03/02/2011   Your procedure is scheduled on:  03-06-2011  Report to [facility]  5:30 AM.  Call this number if you have problems the morning of surgery: [phone]   Remember:   Do not eat food:After Midnight.  Do not drink clear liquids: 4 Hours before arrival  SODA ,TEA,BLACK COFFEE, BROTH,APPLE JUICE, GRAPE JUICE UNTIL 1:30 am.  Take these medicines the morning of surgery with A SIP OF WATER: COREG    Do not wear jewelry, make-up or nail polish.  Do not wear lotions, powders, or perfumes. You may wear deodorant.  Do not shave 48 hours prior to surgery.  Do not bring valuables to the hospital.  Contacts, dentures or bridgework may not be worn into surgery.  Leave suitcase in the car. After surgery it may be brought to your room.  For patients admitted to the hospital, checkout time is 11:00 AM the day of discharge.   Patients discharged the day of surgery will not be allowed to drive home.  Name and phone number of your driver:   Special Instructions: Incentive Spirometry - Practice and bring it with you on the day of surgery. and CHG Shower Use Special Wash: 1/2 bottle night before surgery and 1/2 bottle morning of surgery.   Please read over the following fact sheets that you were given: Blood Transfusion Information, Open Heart Packet, MRSA Information and Surgical Site Infection Prevention

## 2011-03-05 MED ORDER — DEXTROSE 5 % IV SOLN
750.0000 mg | INTRAVENOUS | Status: DC
  Filled 2011-03-05: qty 750

## 2011-03-05 MED ORDER — EPINEPHRINE HCL 1 MG/ML IJ SOLN
0.5000 ug/min | INTRAVENOUS | Status: DC
  Filled 2011-03-05: qty 4

## 2011-03-05 MED ORDER — SODIUM CHLORIDE 0.9 % IV SOLN
INTRAVENOUS | Status: DC
  Filled 2011-03-05: qty 40

## 2011-03-05 MED ORDER — SODIUM CHLORIDE 0.9 % IV SOLN
INTRAVENOUS | Status: DC
  Filled 2011-03-05: qty 1

## 2011-03-05 MED ORDER — DEXTROSE 5 % IV SOLN
1.5000 g | INTRAVENOUS | Status: AC
  Administered 2011-03-06: 1.5 g via INTRAVENOUS
  Administered 2011-03-06: .75 g via INTRAVENOUS
  Filled 2011-03-05: qty 1.5

## 2011-03-05 MED ORDER — DOPAMINE-DEXTROSE 3.2-5 MG/ML-% IV SOLN
2.0000 ug/kg/min | INTRAVENOUS | Status: DC
  Filled 2011-03-05: qty 250

## 2011-03-05 MED ORDER — MAGNESIUM SULFATE 50 % IJ SOLN
40.0000 meq | INTRAMUSCULAR | Status: DC
  Filled 2011-03-05: qty 10

## 2011-03-05 MED ORDER — PLASMA-LYTE 148 IV SOLN
INTRAVENOUS | Status: AC
  Administered 2011-03-06: 09:00:00
  Filled 2011-03-05: qty 0.5

## 2011-03-05 MED ORDER — SODIUM CHLORIDE 0.9 % IV SOLN
1250.0000 mg | INTRAVENOUS | Status: AC
  Administered 2011-03-06: 1250 mg via INTRAVENOUS
  Filled 2011-03-05: qty 1250

## 2011-03-05 MED ORDER — POTASSIUM CHLORIDE 2 MEQ/ML IV SOLN
80.0000 meq | INTRAVENOUS | Status: DC
  Filled 2011-03-05: qty 40

## 2011-03-05 MED ORDER — NITROGLYCERIN IN D5W 200-5 MCG/ML-% IV SOLN
2.0000 ug/min | INTRAVENOUS | Status: DC
  Filled 2011-03-05: qty 250

## 2011-03-05 MED ORDER — PHENYLEPHRINE HCL 10 MG/ML IJ SOLN
30.0000 ug/min | INTRAVENOUS | Status: DC
  Filled 2011-03-05: qty 2

## 2011-03-05 MED ORDER — SODIUM CHLORIDE 0.9 % IV SOLN
0.1000 ug/kg/h | INTRAVENOUS | Status: DC
  Filled 2011-03-05 (×2): qty 4

## 2011-03-06 ENCOUNTER — Inpatient Hospital Stay (HOSPITAL_COMMUNITY): Payer: Managed Care, Other (non HMO) | Admitting: Anesthesiology

## 2011-03-06 ENCOUNTER — Other Ambulatory Visit: Payer: Self-pay

## 2011-03-06 ENCOUNTER — Encounter (HOSPITAL_COMMUNITY): Payer: Self-pay | Admitting: *Deleted

## 2011-03-06 ENCOUNTER — Encounter (HOSPITAL_COMMUNITY): Payer: Self-pay | Admitting: Anesthesiology

## 2011-03-06 ENCOUNTER — Inpatient Hospital Stay (HOSPITAL_COMMUNITY): Payer: Managed Care, Other (non HMO)

## 2011-03-06 ENCOUNTER — Encounter (HOSPITAL_COMMUNITY)
Admission: RE | Disposition: A | Discharge status: Home / Self Care | Payer: Self-pay | Source: Ambulatory Visit | Attending: Cardiothoracic Surgery

## 2011-03-06 ENCOUNTER — Inpatient Hospital Stay (HOSPITAL_COMMUNITY)
Admission: RE | Admit: 2011-03-06 | Discharge: 2011-03-13 | DRG: 236 | Disposition: A | Discharge status: Home / Self Care | Payer: Managed Care, Other (non HMO) | Source: Ambulatory Visit | Attending: Cardiothoracic Surgery | Admitting: Cardiothoracic Surgery

## 2011-03-06 DIAGNOSIS — Z95810 Presence of automatic (implantable) cardiac defibrillator: Secondary | ICD-10-CM

## 2011-03-06 DIAGNOSIS — I252 Old myocardial infarction: Secondary | ICD-10-CM

## 2011-03-06 DIAGNOSIS — I5022 Chronic systolic (congestive) heart failure: Secondary | ICD-10-CM | POA: Diagnosis present

## 2011-03-06 DIAGNOSIS — I2510 Atherosclerotic heart disease of native coronary artery without angina pectoris: Secondary | ICD-10-CM

## 2011-03-06 DIAGNOSIS — F3289 Other specified depressive episodes: Secondary | ICD-10-CM | POA: Diagnosis present

## 2011-03-06 DIAGNOSIS — F329 Major depressive disorder, single episode, unspecified: Secondary | ICD-10-CM | POA: Diagnosis present

## 2011-03-06 DIAGNOSIS — E119 Type 2 diabetes mellitus without complications: Secondary | ICD-10-CM | POA: Diagnosis present

## 2011-03-06 DIAGNOSIS — Z7982 Long term (current) use of aspirin: Secondary | ICD-10-CM

## 2011-03-06 DIAGNOSIS — Z794 Long term (current) use of insulin: Secondary | ICD-10-CM

## 2011-03-06 DIAGNOSIS — D62 Acute posthemorrhagic anemia: Secondary | ICD-10-CM | POA: Diagnosis not present

## 2011-03-06 DIAGNOSIS — I472 Ventricular tachycardia, unspecified: Secondary | ICD-10-CM | POA: Diagnosis present

## 2011-03-06 DIAGNOSIS — M109 Gout, unspecified: Secondary | ICD-10-CM | POA: Diagnosis present

## 2011-03-06 DIAGNOSIS — E785 Hyperlipidemia, unspecified: Secondary | ICD-10-CM | POA: Diagnosis present

## 2011-03-06 DIAGNOSIS — M1990 Unspecified osteoarthritis, unspecified site: Secondary | ICD-10-CM | POA: Diagnosis present

## 2011-03-06 DIAGNOSIS — I25810 Atherosclerosis of coronary artery bypass graft(s) without angina pectoris: Secondary | ICD-10-CM | POA: Diagnosis present

## 2011-03-06 DIAGNOSIS — I10 Essential (primary) hypertension: Secondary | ICD-10-CM | POA: Diagnosis present

## 2011-03-06 DIAGNOSIS — I4729 Other ventricular tachycardia: Secondary | ICD-10-CM | POA: Diagnosis present

## 2011-03-06 DIAGNOSIS — J9801 Acute bronchospasm: Secondary | ICD-10-CM | POA: Diagnosis not present

## 2011-03-06 DIAGNOSIS — I2589 Other forms of chronic ischemic heart disease: Secondary | ICD-10-CM | POA: Diagnosis present

## 2011-03-06 HISTORY — PX: CORONARY ARTERY BYPASS GRAFT: SHX141

## 2011-03-06 LAB — POCT I-STAT 3, ART BLOOD GAS (G3+)
Acid-Base Excess: 2 mmol/L (ref 0.0–2.0)
Acid-base deficit: 4 mmol/L — ABNORMAL HIGH (ref 0.0–2.0)
Acid-base deficit: 3 mmol/L — ABNORMAL HIGH (ref 0.0–2.0)
Acid-base deficit: 3 mmol/L — ABNORMAL HIGH (ref 0.0–2.0)
Acid-base deficit: 3 mmol/L — ABNORMAL HIGH (ref 0.0–2.0)
Bicarbonate: 27 mEq/L — ABNORMAL HIGH (ref 20.0–24.0)
Bicarbonate: 23 mEq/L (ref 20.0–24.0)
Bicarbonate: 23 mEq/L (ref 20.0–24.0)
Bicarbonate: 23.3 mEq/L (ref 20.0–24.0)
Bicarbonate: 23.3 mEq/L (ref 20.0–24.0)
O2 Saturation: 100 %
O2 Saturation: 90 %
O2 Saturation: 95 %
O2 Saturation: 95 %
O2 Saturation: 91 %
Patient temperature: 35.4
Patient temperature: 37.3
Patient temperature: 37.4
Patient temperature: 38.6
TCO2: 28 mmol/L (ref 0–100)
TCO2: 24 mmol/L (ref 0–100)
TCO2: 24 mmol/L (ref 0–100)
TCO2: 25 mmol/L (ref 0–100)
TCO2: 25 mmol/L (ref 0–100)
pCO2 arterial: 44.1 mmHg (ref 35.0–45.0)
pCO2 arterial: 44.8 mmHg (ref 35.0–45.0)
pCO2 arterial: 47.3 mmHg — ABNORMAL HIGH (ref 35.0–45.0)
pCO2 arterial: 48.2 mmHg — ABNORMAL HIGH (ref 35.0–45.0)
pCO2 arterial: 50 mmHg — ABNORMAL HIGH (ref 35.0–45.0)
pH, Arterial: 7.395 (ref 7.350–7.450)
pH, Arterial: 7.311 — ABNORMAL LOW (ref 7.350–7.450)
pH, Arterial: 7.294 — ABNORMAL LOW (ref 7.350–7.450)
pH, Arterial: 7.297 — ABNORMAL LOW (ref 7.350–7.450)
pH, Arterial: 7.285 — ABNORMAL LOW (ref 7.350–7.450)
pO2, Arterial: 249 mmHg — ABNORMAL HIGH (ref 80.0–100.0)
pO2, Arterial: 58 mmHg — ABNORMAL LOW (ref 80.0–100.0)
pO2, Arterial: 84 mmHg (ref 80.0–100.0)
pO2, Arterial: 87 mmHg (ref 80.0–100.0)
pO2, Arterial: 74 mmHg — ABNORMAL LOW (ref 80.0–100.0)

## 2011-03-06 LAB — MAGNESIUM: Magnesium: 2.2 mg/dL (ref 1.5–2.5)

## 2011-03-06 LAB — POCT I-STAT 4, (NA,K, GLUC, HGB,HCT)
Glucose, Bld: 128 mg/dL — ABNORMAL HIGH (ref 70–99)
Glucose, Bld: 101 mg/dL — ABNORMAL HIGH (ref 70–99)
Glucose, Bld: 103 mg/dL — ABNORMAL HIGH (ref 70–99)
Glucose, Bld: 121 mg/dL — ABNORMAL HIGH (ref 70–99)
Glucose, Bld: 125 mg/dL — ABNORMAL HIGH (ref 70–99)
Glucose, Bld: 184 mg/dL — ABNORMAL HIGH (ref 70–99)
Glucose, Bld: 137 mg/dL — ABNORMAL HIGH (ref 70–99)
HCT: 41 % (ref 39.0–52.0)
HCT: 34 % — ABNORMAL LOW (ref 39.0–52.0)
HCT: 37 % — ABNORMAL LOW (ref 39.0–52.0)
HCT: 33 % — ABNORMAL LOW (ref 39.0–52.0)
HCT: 21 % — ABNORMAL LOW (ref 39.0–52.0)
HCT: 34 % — ABNORMAL LOW (ref 39.0–52.0)
HCT: 33 % — ABNORMAL LOW (ref 39.0–52.0)
Hemoglobin: 13.9 g/dL (ref 13.0–17.0)
Hemoglobin: 11.6 g/dL — ABNORMAL LOW (ref 13.0–17.0)
Hemoglobin: 12.6 g/dL — ABNORMAL LOW (ref 13.0–17.0)
Hemoglobin: 11.2 g/dL — ABNORMAL LOW (ref 13.0–17.0)
Hemoglobin: 11.6 g/dL — ABNORMAL LOW (ref 13.0–17.0)
Hemoglobin: 7.1 g/dL — ABNORMAL LOW (ref 13.0–17.0)
Hemoglobin: 11.2 g/dL — ABNORMAL LOW (ref 13.0–17.0)
Potassium: 3.8 mEq/L (ref 3.5–5.1)
Potassium: 3.5 mEq/L (ref 3.5–5.1)
Potassium: 3.8 mEq/L (ref 3.5–5.1)
Potassium: 4 mEq/L (ref 3.5–5.1)
Potassium: 2.3 mEq/L — CL (ref 3.5–5.1)
Potassium: 4.1 mEq/L (ref 3.5–5.1)
Potassium: 3.5 mEq/L (ref 3.5–5.1)
Sodium: 141 mEq/L (ref 135–145)
Sodium: 136 mEq/L (ref 135–145)
Sodium: 141 mEq/L (ref 135–145)
Sodium: 139 mEq/L (ref 135–145)
Sodium: 141 mEq/L (ref 135–145)
Sodium: 149 mEq/L — ABNORMAL HIGH (ref 135–145)
Sodium: 143 mEq/L (ref 135–145)

## 2011-03-06 LAB — CREATININE, SERUM
Creatinine, Ser: 0.93 mg/dL (ref 0.50–1.35)
GFR calc Af Amer: >90 mL/min (ref >90)
GFR calc non Af Amer: 84 mL/min — ABNORMAL LOW (ref >90)

## 2011-03-06 LAB — PREPARE RBC (CROSSMATCH)

## 2011-03-06 LAB — CBC
HCT: 33.6 % — ABNORMAL LOW (ref 39.0–52.0)
Hemoglobin: 10.8 g/dL — ABNORMAL LOW (ref 13.0–17.0)
MCH: 29.3 pg (ref 26.0–34.0)
MCHC: 32.1 g/dL (ref 30.0–36.0)
MCV: 91.3 fL (ref 78.0–100.0)
Platelets: 143 10*3/uL — ABNORMAL LOW (ref 150–400)
RBC: 3.68 MIL/uL — ABNORMAL LOW (ref 4.22–5.81)
RDW: 14.3 % (ref 11.5–15.5)
WBC: 14.2 10*3/uL — ABNORMAL HIGH (ref 4.0–10.5)

## 2011-03-06 LAB — POCT I-STAT, CHEM 8
BUN: 14 mg/dL (ref 6–23)
Calcium, Ion: 1.16 mmol/L (ref 1.12–1.32)
Chloride: 105 mEq/L (ref 96–112)
Creatinine, Ser: 0.9 mg/dL (ref 0.50–1.35)
Glucose, Bld: 131 mg/dL — ABNORMAL HIGH (ref 70–99)
HCT: 33 % — ABNORMAL LOW (ref 39.0–52.0)
Hemoglobin: 11.2 g/dL — ABNORMAL LOW (ref 13.0–17.0)
Potassium: 4.4 mEq/L (ref 3.5–5.1)
Sodium: 141 mEq/L (ref 135–145)
TCO2: 22 mmol/L (ref 0–100)

## 2011-03-06 LAB — HEMOGLOBIN AND HEMATOCRIT, BLOOD
HCT: 30.5 % — ABNORMAL LOW (ref 39.0–52.0)
Hemoglobin: 10.1 g/dL — ABNORMAL LOW (ref 13.0–17.0)

## 2011-03-06 LAB — PROTIME-INR
INR: 1.64 — ABNORMAL HIGH (ref 0.00–1.49)
Prothrombin Time: 19.7 seconds — ABNORMAL HIGH (ref 11.6–15.2)

## 2011-03-06 LAB — APTT: aPTT: 33 seconds (ref 24–37)

## 2011-03-06 LAB — PLATELET COUNT: Platelets: 186 10*3/uL (ref 150–400)

## 2011-03-06 LAB — GLUCOSE, CAPILLARY: Glucose-Capillary: 139 mg/dL — ABNORMAL HIGH (ref 70–99)

## 2011-03-06 SURGERY — REDO CORONARY ARTERY BYPASS GRAFTING (CABG)
Anesthesia: General

## 2011-03-06 MED ORDER — PROTAMINE SULFATE 10 MG/ML IV SOLN
INTRAVENOUS | Status: DC | PRN
  Administered 2011-03-06: 380 mg via INTRAVENOUS

## 2011-03-06 MED ORDER — SODIUM CHLORIDE 0.9 % IR SOLN
Status: DC | PRN
  Administered 2011-03-06: 5000 mL

## 2011-03-06 MED ORDER — MIDAZOLAM HCL 2 MG/2ML IJ SOLN: 2.0000 mg | INTRAMUSCULAR | Status: DC | PRN

## 2011-03-06 MED ORDER — LACTATED RINGERS IV SOLN: 500.0000 mL | Freq: Once | INTRAVENOUS | Status: AC | PRN

## 2011-03-06 MED ORDER — ALBUMIN HUMAN 5 % IV SOLN
250.0000 mL | INTRAVENOUS | Status: AC | PRN
  Administered 2011-03-06 (×2): 250 mL via INTRAVENOUS

## 2011-03-06 MED ORDER — ACETAMINOPHEN 160 MG/5ML PO SOLN: 975.0000 mg | Freq: Four times a day (QID) | ORAL | Status: DC

## 2011-03-06 MED ORDER — FENTANYL CITRATE 0.05 MG/ML IJ SOLN
INTRAMUSCULAR | Status: DC | PRN
  Administered 2011-03-06: 300 ug via INTRAVENOUS
  Administered 2011-03-06: 100 ug via INTRAVENOUS
  Administered 2011-03-06: 1000 ug via INTRAVENOUS

## 2011-03-06 MED ORDER — ONDANSETRON HCL 4 MG/2ML IJ SOLN
4.0000 mg | Freq: Four times a day (QID) | INTRAMUSCULAR | Status: DC | PRN
  Filled 2011-03-06: qty 2

## 2011-03-06 MED ORDER — NOREPINEPHRINE BITARTRATE 1 MG/ML IJ SOLN
2.0000 ug/min | INTRAVENOUS | Status: DC
  Administered 2011-03-06: 5 ug/min via INTRAVENOUS
  Filled 2011-03-06 (×2): qty 8

## 2011-03-06 MED ORDER — MIDAZOLAM HCL 5 MG/5ML IJ SOLN
INTRAMUSCULAR | Status: DC | PRN
  Administered 2011-03-06 (×2): 5 mg via INTRAVENOUS
  Administered 2011-03-06: 2 mg via INTRAVENOUS
  Administered 2011-03-06: 3 mg via INTRAVENOUS
  Administered 2011-03-06: 5 mg via INTRAVENOUS
  Administered 2011-03-06: 2 mg via INTRAVENOUS

## 2011-03-06 MED ORDER — HEPARIN SODIUM (PORCINE) 1000 UNIT/ML IJ SOLN
INTRAMUSCULAR | Status: DC | PRN
  Administered 2011-03-06: 38000 [IU] via INTRAVENOUS

## 2011-03-06 MED ORDER — SODIUM CHLORIDE 0.9 % IJ SOLN
OROMUCOSAL | Status: DC | PRN
  Administered 2011-03-06 (×2): via TOPICAL

## 2011-03-06 MED ORDER — AMINOCAPROIC ACID 250 MG/ML IV SOLN
5.0000 g | Freq: Once | INTRAVENOUS | Status: AC
  Administered 2011-03-06: 5 g via INTRAVENOUS

## 2011-03-06 MED ORDER — NITROGLYCERIN IN D5W 200-5 MCG/ML-% IV SOLN: 0.0000 ug/min | INTRAVENOUS | Status: DC

## 2011-03-06 MED ORDER — METOPROLOL TARTRATE 12.5 MG HALF TABLET: 12.5000 mg | ORAL_TABLET | Freq: Once | ORAL | Status: DC

## 2011-03-06 MED ORDER — MAGNESIUM SULFATE 50 % IJ SOLN
4.0000 g | Freq: Once | INTRAVENOUS | Status: AC
  Administered 2011-03-06: 4 g via INTRAVENOUS
  Filled 2011-03-06: qty 8

## 2011-03-06 MED ORDER — MORPHINE SULFATE 2 MG/ML IJ SOLN
1.0000 mg | INTRAMUSCULAR | Status: AC | PRN
  Administered 2011-03-06 – 2011-03-07 (×2): 2 mg via INTRAVENOUS
  Filled 2011-03-06 (×2): qty 1

## 2011-03-06 MED ORDER — ASPIRIN 81 MG PO TABS: 81.0000 mg | ORAL_TABLET | Freq: Every day | ORAL | Status: DC

## 2011-03-06 MED ORDER — PHENYLEPHRINE HCL 10 MG/ML IJ SOLN
10.0000 mg | INTRAVENOUS | Status: DC | PRN
  Administered 2011-03-06: 10 ug/min via INTRAVENOUS

## 2011-03-06 MED ORDER — BISACODYL 5 MG PO TBEC
10.0000 mg | DELAYED_RELEASE_TABLET | Freq: Every day | ORAL | Status: DC
  Administered 2011-03-07 – 2011-03-10 (×4): 10 mg via ORAL
  Filled 2011-03-06 (×3): qty 2

## 2011-03-06 MED ORDER — VENLAFAXINE HCL ER 150 MG PO CP24
150.0000 mg | ORAL_CAPSULE | Freq: Every day | ORAL | Status: DC
  Administered 2011-03-07 – 2011-03-13 (×7): 150 mg via ORAL
  Filled 2011-03-06 (×7): qty 1

## 2011-03-06 MED ORDER — SODIUM CHLORIDE 0.9 % IV SOLN
INTRAVENOUS | Status: DC | PRN
  Filled 2011-03-06: qty 1

## 2011-03-06 MED ORDER — ROCURONIUM BROMIDE 100 MG/10ML IV SOLN
INTRAVENOUS | Status: DC | PRN
  Administered 2011-03-06: 100 mg via INTRAVENOUS

## 2011-03-06 MED ORDER — SODIUM CHLORIDE 0.9 % IV SOLN: 250.0000 mL | INTRAVENOUS | Status: DC

## 2011-03-06 MED ORDER — DOCUSATE SODIUM 100 MG PO CAPS
200.0000 mg | ORAL_CAPSULE | Freq: Every day | ORAL | Status: DC
  Administered 2011-03-07 – 2011-03-13 (×7): 200 mg via ORAL
  Filled 2011-03-06 (×6): qty 2

## 2011-03-06 MED ORDER — LACTATED RINGERS IV SOLN
INTRAVENOUS | Status: DC
  Administered 2011-03-06: 18:00:00 via INTRAVENOUS

## 2011-03-06 MED ORDER — SODIUM CHLORIDE 0.9 % IV SOLN
200.0000 ug | INTRAVENOUS | Status: DC | PRN
  Administered 2011-03-06: 0.2 ug/kg/h via INTRAVENOUS

## 2011-03-06 MED ORDER — TRIAMCINOLONE ACETONIDE 0.1 % EX CREA
1.0000 "application " | TOPICAL_CREAM | Freq: Every day | CUTANEOUS | Status: DC | PRN
  Filled 2011-03-06: qty 15

## 2011-03-06 MED ORDER — SODIUM CHLORIDE 0.45 % IV SOLN
INTRAVENOUS | Status: DC
  Administered 2011-03-07: 11:00:00 via INTRAVENOUS

## 2011-03-06 MED ORDER — OXYCODONE HCL 5 MG PO TABS
5.0000 mg | ORAL_TABLET | ORAL | Status: DC | PRN
  Administered 2011-03-07: 10 mg via ORAL
  Administered 2011-03-07: 5 mg via ORAL
  Administered 2011-03-08 – 2011-03-10 (×9): 10 mg via ORAL
  Filled 2011-03-06: qty 1
  Filled 2011-03-06 (×11): qty 2

## 2011-03-06 MED ORDER — MILRINONE IN DEXTROSE 200-5 MCG/ML-% IV SOLN
INTRAVENOUS | Status: DC | PRN
  Administered 2011-03-06: .3 ug/kg/min via INTRAVENOUS

## 2011-03-06 MED ORDER — BISACODYL 10 MG RE SUPP: 10.0000 mg | Freq: Every day | RECTAL | Status: DC

## 2011-03-06 MED ORDER — ASPIRIN 81 MG PO CHEW
81.0000 mg | CHEWABLE_TABLET | Freq: Every day | ORAL | Status: DC
  Administered 2011-03-07 – 2011-03-13 (×7): 81 mg via ORAL
  Filled 2011-03-06 (×7): qty 1

## 2011-03-06 MED ORDER — MORPHINE SULFATE 4 MG/ML IJ SOLN
INTRAMUSCULAR | Status: AC
  Administered 2011-03-06: 2 mg via INTRAVENOUS
  Filled 2011-03-06: qty 1

## 2011-03-06 MED ORDER — SODIUM CHLORIDE 0.9 % IJ SOLN
3.0000 mL | INTRAMUSCULAR | Status: DC | PRN
  Administered 2011-03-10: 3 mL via INTRAVENOUS

## 2011-03-06 MED ORDER — INSULIN ASPART 100 UNIT/ML ~~LOC~~ SOLN
0.0000 [IU] | SUBCUTANEOUS | Status: DC
  Administered 2011-03-08: 2 [IU] via SUBCUTANEOUS
  Administered 2011-03-08: 4 [IU] via SUBCUTANEOUS
  Filled 2011-03-06: qty 3

## 2011-03-06 MED ORDER — DOPAMINE-DEXTROSE 3.2-5 MG/ML-% IV SOLN
3.0000 ug/kg/min | INTRAVENOUS | Status: DC
  Filled 2011-03-06: qty 250

## 2011-03-06 MED ORDER — MORPHINE SULFATE 4 MG/ML IJ SOLN
2.0000 mg | INTRAMUSCULAR | Status: DC | PRN
  Administered 2011-03-06: 2 mg via INTRAVENOUS

## 2011-03-06 MED ORDER — LACTATED RINGERS IV SOLN
INTRAVENOUS | Status: DC | PRN
  Administered 2011-03-06: 09:00:00 via INTRAVENOUS

## 2011-03-06 MED ORDER — VANCOMYCIN HCL 1000 MG IV SOLR
1000.0000 mg | Freq: Once | INTRAVENOUS | Status: AC
  Administered 2011-03-06: 1000 mg via INTRAVENOUS
  Filled 2011-03-06: qty 1000

## 2011-03-06 MED ORDER — ACETAMINOPHEN 650 MG RE SUPP: 650.0000 mg | RECTAL | Status: AC

## 2011-03-06 MED ORDER — ACETAMINOPHEN 500 MG PO TABS
1000.0000 mg | ORAL_TABLET | Freq: Four times a day (QID) | ORAL | Status: DC
  Administered 2011-03-07 – 2011-03-09 (×7): 1000 mg via ORAL
  Filled 2011-03-06 (×12): qty 2

## 2011-03-06 MED ORDER — ROSUVASTATIN CALCIUM 20 MG PO TABS
20.0000 mg | ORAL_TABLET | Freq: Every day | ORAL | Status: DC
  Administered 2011-03-07 – 2011-03-12 (×6): 20 mg via ORAL
  Filled 2011-03-06 (×7): qty 1

## 2011-03-06 MED ORDER — ACETAMINOPHEN 500 MG PO TABS: 1000.0000 mg | ORAL_TABLET | Freq: Four times a day (QID) | ORAL | Status: DC

## 2011-03-06 MED ORDER — SODIUM CHLORIDE 0.9 % IV SOLN
10.0000 g | INTRAVENOUS | Status: DC | PRN
  Administered 2011-03-06: 1 g/h via INTRAVENOUS

## 2011-03-06 MED ORDER — SODIUM CHLORIDE 0.9 % IV SOLN
10.0000 g | INTRAVENOUS | Status: DC | PRN
  Administered 2011-03-06: 5 g via INTRAVENOUS

## 2011-03-06 MED ORDER — HEMOSTATIC AGENTS (NO CHARGE) OPTIME
TOPICAL | Status: DC | PRN
  Administered 2011-03-06: 1 via TOPICAL

## 2011-03-06 MED ORDER — ACETAMINOPHEN 160 MG/5ML PO SOLN
650.0000 mg | ORAL | Status: AC
Start: 2011-03-06 — End: 2011-03-06
  Administered 2011-03-06: 650 mg

## 2011-03-06 MED ORDER — SODIUM CHLORIDE 0.9 % IV SOLN
100.0000 [IU] | INTRAVENOUS | Status: DC | PRN
  Administered 2011-03-06: 1 [IU]/h via INTRAVENOUS

## 2011-03-06 MED ORDER — ALBUMIN HUMAN 5 % IV SOLN
INTRAVENOUS | Status: DC | PRN
  Administered 2011-03-06: 14:00:00 via INTRAVENOUS

## 2011-03-06 MED ORDER — ALBUMIN HUMAN 5 % IV SOLN
INTRAVENOUS | Status: AC
  Filled 2011-03-06: qty 250

## 2011-03-06 MED ORDER — PANTOPRAZOLE SODIUM 40 MG PO TBEC
40.0000 mg | DELAYED_RELEASE_TABLET | Freq: Every day | ORAL | Status: DC
  Administered 2011-03-08 – 2011-03-10 (×3): 40 mg via ORAL
  Filled 2011-03-06 (×3): qty 1

## 2011-03-06 MED ORDER — CALCIUM CHLORIDE 10 % IV SOLN
1.0000 g | Freq: Once | INTRAVENOUS | Status: AC
  Administered 2011-03-06: 1 g via INTRAVENOUS

## 2011-03-06 MED ORDER — INSULIN GLARGINE 100 UNIT/ML ~~LOC~~ SOLN
25.0000 [IU] | SUBCUTANEOUS | Status: DC
  Administered 2011-03-07: 25 [IU] via SUBCUTANEOUS
  Filled 2011-03-06: qty 3

## 2011-03-06 MED ORDER — DEXTROSE 5 % IV SOLN
0.0000 ug/min | INTRAVENOUS | Status: DC
  Filled 2011-03-06: qty 2

## 2011-03-06 MED ORDER — POTASSIUM CHLORIDE 10 MEQ/50ML IV SOLN
10.0000 meq | INTRAVENOUS | Status: AC
  Administered 2011-03-06 (×3): 10 meq via INTRAVENOUS

## 2011-03-06 MED ORDER — METOPROLOL TARTRATE 12.5 MG HALF TABLET
12.5000 mg | ORAL_TABLET | Freq: Two times a day (BID) | ORAL | Status: DC
  Administered 2011-03-07 – 2011-03-10 (×5): 12.5 mg via ORAL
  Filled 2011-03-06 (×9): qty 1

## 2011-03-06 MED ORDER — METOPROLOL TARTRATE 25 MG/10 ML ORAL SUSPENSION
12.5000 mg | Freq: Two times a day (BID) | ORAL | Status: DC
  Administered 2011-03-07 – 2011-03-08 (×2): 12.5 mg
  Filled 2011-03-06 (×9): qty 5

## 2011-03-06 MED ORDER — SODIUM CHLORIDE 0.9 % IV SOLN: INTRAVENOUS | Status: DC

## 2011-03-06 MED ORDER — SODIUM CHLORIDE 0.9 % IV SOLN
5.0000 g | INTRAVENOUS | Status: DC
  Filled 2011-03-06: qty 20

## 2011-03-06 MED ORDER — SODIUM CHLORIDE 0.9 % IV SOLN
0.1000 ug/kg/h | INTRAVENOUS | Status: DC
  Administered 2011-03-06: 0.5 ug/kg/h via INTRAVENOUS
  Filled 2011-03-06: qty 2

## 2011-03-06 MED ORDER — SODIUM CHLORIDE 0.9 % IV SOLN
INTRAVENOUS | Status: DC
  Administered 2011-03-07: 22:00:00 via INTRAVENOUS
  Filled 2011-03-06: qty 1

## 2011-03-06 MED ORDER — VECURONIUM BROMIDE 10 MG IV SOLR
INTRAVENOUS | Status: DC | PRN
  Administered 2011-03-06 (×4): 5 mg via INTRAVENOUS

## 2011-03-06 MED ORDER — SODIUM CHLORIDE 0.9 % IJ SOLN
3.0000 mL | Freq: Two times a day (BID) | INTRAMUSCULAR | Status: DC
  Administered 2011-03-07 – 2011-03-10 (×6): 3 mL via INTRAVENOUS

## 2011-03-06 MED ORDER — DOPAMINE-DEXTROSE 3.2-5 MG/ML-% IV SOLN
INTRAVENOUS | Status: DC | PRN
  Administered 2011-03-06: 3 ug/kg/min via INTRAVENOUS

## 2011-03-06 MED ORDER — ALBUTEROL SULFATE (5 MG/ML) 0.5% IN NEBU
2.5000 mg | INHALATION_SOLUTION | Freq: Four times a day (QID) | RESPIRATORY_TRACT | Status: DC
  Administered 2011-03-06: 2.5 mg via RESPIRATORY_TRACT
  Filled 2011-03-06: qty 0.5

## 2011-03-06 MED ORDER — ETOMIDATE 2 MG/ML IV SOLN
INTRAVENOUS | Status: DC | PRN
  Administered 2011-03-06: 20 mg via INTRAVENOUS

## 2011-03-06 MED ORDER — METOPROLOL TARTRATE 1 MG/ML IV SOLN: 2.5000 mg | INTRAVENOUS | Status: DC | PRN

## 2011-03-06 MED ORDER — DEXTROSE 5 % IV SOLN
1.5000 g | Freq: Two times a day (BID) | INTRAVENOUS | Status: AC
  Administered 2011-03-06 – 2011-03-08 (×4): 1.5 g via INTRAVENOUS
  Filled 2011-03-06 (×4): qty 1.5

## 2011-03-06 MED ORDER — ACETAMINOPHEN 160 MG/5ML PO SOLN
975.0000 mg | Freq: Four times a day (QID) | ORAL | Status: DC
  Filled 2011-03-06: qty 40.6

## 2011-03-06 MED ORDER — FAMOTIDINE IN NACL 20-0.9 MG/50ML-% IV SOLN
20.0000 mg | Freq: Two times a day (BID) | INTRAVENOUS | Status: AC
  Administered 2011-03-06 – 2011-03-07 (×2): 20 mg via INTRAVENOUS
  Filled 2011-03-06 (×2): qty 50

## 2011-03-06 SURGICAL SUPPLY — 107 items
ADAPTER CARDIO PERF ANTE/RETRO (ADAPTER) ×1 IMPLANT
ADPR PRFSN 84XANTGRD RTRGD (ADAPTER) ×1
APPLIER CLIP 9.375 MED OPEN (MISCELLANEOUS)
APPLIER CLIP 9.375 SM OPEN (CLIP)
APR CLP MED 9.3 20 MLT OPN (MISCELLANEOUS)
APR CLP SM 9.3 20 MLT OPN (CLIP)
ATTRACTOMAT 16X20 MAGNETIC DRP (DRAPES) ×2 IMPLANT
BAG DECANTER FOR FLEXI CONT (MISCELLANEOUS) ×2 IMPLANT
BANDAGE ELASTIC 4 VELCRO ST LF (GAUZE/BANDAGES/DRESSINGS) ×2 IMPLANT
BANDAGE ELASTIC 6 VELCRO ST LF (GAUZE/BANDAGES/DRESSINGS) ×2 IMPLANT
BANDAGE GAUZE ELAST BULKY 4 IN (GAUZE/BANDAGES/DRESSINGS) ×2 IMPLANT
BLADE OSCILLATING /SAGITTAL (BLADE) ×1 IMPLANT
BLADE SURG 11 STRL SS (BLADE) ×1 IMPLANT
BLADE SURG ROTATE 9660 (MISCELLANEOUS) ×1 IMPLANT
CANISTER SUCTION 2500CC (MISCELLANEOUS) ×2 IMPLANT
CANN PRFSN .5XCNCT 15X34-48 (MISCELLANEOUS) ×1
CANNULA AORTIC 6.5MM 20FR (CANNULA) ×1 IMPLANT
CANNULA GUNDRY RCSP 15FR (MISCELLANEOUS) IMPLANT
CANNULA PRFSN .5XCNCT 15X34-48 (MISCELLANEOUS) IMPLANT
CANNULA VEN 2 STAGE (MISCELLANEOUS) ×3 IMPLANT
CATH CPB KIT GERHARDT (MISCELLANEOUS) ×1 IMPLANT
CATH RETROPLEGIA CORONARY 14FR (CATHETERS) ×1 IMPLANT
CATH THORACIC 28FR (CATHETERS) ×1 IMPLANT
CLIP APPLIE 9.375 MED OPEN (MISCELLANEOUS) IMPLANT
CLIP APPLIE 9.375 SM OPEN (CLIP) IMPLANT
CLIP FOGARTY SPRING 6M (CLIP) IMPLANT
CLIP TI MEDIUM 24 (CLIP) IMPLANT
CLIP TI WIDE RED SMALL 24 (CLIP) IMPLANT
CLOTH BEACON ORANGE TIMEOUT ST (SAFETY) ×2 IMPLANT
CONN 3/8X3/8 GISH STERILE (MISCELLANEOUS) ×1 IMPLANT
CONN Y 3/8X3/8X3/8  BEN (MISCELLANEOUS)
CONN Y 3/8X3/8X3/8 BEN (MISCELLANEOUS) IMPLANT
COVER SURGICAL LIGHT HANDLE (MISCELLANEOUS) ×4 IMPLANT
COVER TRANSDUCER ULTRASND GEL (DRAPE) ×1 IMPLANT
CRADLE DONUT ADULT HEAD (MISCELLANEOUS) ×2 IMPLANT
DRAIN CHANNEL 28F RND 3/8 FF (WOUND CARE) ×2 IMPLANT
DRAIN CHANNEL 32F RND 10.7 FF (WOUND CARE) IMPLANT
DRAPE CARDIOVASCULAR INCISE (DRAPES) ×2
DRAPE SLUSH MACHINE 52X66 (DRAPES) ×1 IMPLANT
DRAPE SLUSH/WARMER DISC (DRAPES) IMPLANT
DRAPE SRG 135X102X78XABS (DRAPES) ×1 IMPLANT
DRSG COVADERM 4X14 (GAUZE/BANDAGES/DRESSINGS) ×2 IMPLANT
ELECT BLADE 4.0 EZ CLEAN MEGAD (MISCELLANEOUS) ×2
ELECT CAUTERY BLADE 6.4 (BLADE) ×1 IMPLANT
ELECT REM PT RETURN 9FT ADLT (ELECTROSURGICAL) ×4
ELECTRODE BLDE 4.0 EZ CLN MEGD (MISCELLANEOUS) IMPLANT
ELECTRODE REM PT RTRN 9FT ADLT (ELECTROSURGICAL) ×2 IMPLANT
GLOVE SURG SS PI 6.5 STRL IVOR (GLOVE) ×3 IMPLANT
GLOVE SURG SS PI 7.0 STRL IVOR (GLOVE) ×1 IMPLANT
GLOVE SURG SS PI 7.5 STRL IVOR (GLOVE) ×1 IMPLANT
GLOVE SURG SS PI 8.0 STRL IVOR (GLOVE) ×4 IMPLANT
GOWN STRL NON-REIN LRG LVL3 (GOWN DISPOSABLE) ×13 IMPLANT
HEMOSTAT POWDER SURGIFOAM 1G (HEMOSTASIS) ×2 IMPLANT
HEMOSTAT SURGICEL 2X14 (HEMOSTASIS) ×1 IMPLANT
INSERT FOGARTY XLG (MISCELLANEOUS) IMPLANT
KIT BASIN OR (CUSTOM PROCEDURE TRAY) ×2 IMPLANT
KIT ROOM TURNOVER OR (KITS) ×2 IMPLANT
KIT SUCTION CATH 14FR (SUCTIONS) ×2 IMPLANT
KIT VASOVIEW W/TROCAR VH 2000 (KITS) ×2 IMPLANT
LEAD MYOCARDIAL PACING ×1 IMPLANT
LEAD PACING MYOCARDI (MISCELLANEOUS) ×2 IMPLANT
MARKER GRAFT CORONARY BYPASS (MISCELLANEOUS) ×2 IMPLANT
NS IRRIG 1000ML POUR BTL (IV SOLUTION) ×9 IMPLANT
PACK OPEN HEART (CUSTOM PROCEDURE TRAY) ×2 IMPLANT
PAD ARMBOARD 7.5X6 YLW CONV (MISCELLANEOUS) ×2 IMPLANT
PAD DEFIB R2 (MISCELLANEOUS) ×1 IMPLANT
PENCIL BUTTON HOLSTER BLD 10FT (ELECTRODE) ×1 IMPLANT
PUNCH AORTIC ROTATE 4.0MM (MISCELLANEOUS) ×1 IMPLANT
PUNCH AORTIC ROTATE 4.5MM 8IN (MISCELLANEOUS) IMPLANT
PUNCH AORTIC ROTATE 5MM 8IN (MISCELLANEOUS) IMPLANT
SET CARDIOPLEGIA MPS 5001102 (MISCELLANEOUS) ×1 IMPLANT
SOLUTION ANTI FOG 6CC (MISCELLANEOUS) ×1 IMPLANT
SPONGE GAUZE 4X4 12PLY (GAUZE/BANDAGES/DRESSINGS) ×3 IMPLANT
SPONGE LAP 18X18 X RAY DECT (DISPOSABLE) ×4 IMPLANT
STOPCOCK MORSE 400PSI 3WAY (MISCELLANEOUS) ×1 IMPLANT
SUT BONE WAX W31G (SUTURE) ×1 IMPLANT
SUT MNCRL AB 4-0 PS2 18 (SUTURE) ×1 IMPLANT
SUT PROLENE 3 0 SH 1 (SUTURE) ×1 IMPLANT
SUT PROLENE 4 0 TF (SUTURE) ×2 IMPLANT
SUT PROLENE 6 0 CC (SUTURE) ×2 IMPLANT
SUT PROLENE 7 0 BV1 MDA (SUTURE) ×3 IMPLANT
SUT PROLENE 8 0 BV175 6 (SUTURE) ×2 IMPLANT
SUT SILK 2 0 SH CR/8 (SUTURE) ×1 IMPLANT
SUT STEEL 6MS V (SUTURE) ×1 IMPLANT
SUT STEEL STERNAL CCS#1 18IN (SUTURE) IMPLANT
SUT STEEL SZ 6 DBL 3X14 BALL (SUTURE) ×1 IMPLANT
SUT VIC AB 1 CTX 18 (SUTURE) ×2 IMPLANT
SUT VIC AB 2-0 CT1 27 (SUTURE)
SUT VIC AB 2-0 CT1 TAPERPNT 27 (SUTURE) IMPLANT
SUT VIC AB 2-0 CTX 27 (SUTURE) IMPLANT
SUT VIC AB 3-0 SH 27 (SUTURE) ×2
SUT VIC AB 3-0 SH 27X BRD (SUTURE) IMPLANT
SUT VIC AB 3-0 X1 27 (SUTURE) IMPLANT
SUT VICRYL 4-0 PS2 18IN ABS (SUTURE) ×1 IMPLANT
SUTURE E-PAK OPEN HEART (SUTURE) ×2 IMPLANT
SYSTEM SAHARA CHEST DRAIN ATS (WOUND CARE) ×2 IMPLANT
TAPE CLOTH SURG 4X10 WHT LF (GAUZE/BANDAGES/DRESSINGS) ×1 IMPLANT
TOWEL OR 17X24 6PK STRL BLUE (TOWEL DISPOSABLE) ×2 IMPLANT
TOWEL OR 17X26 10 PK STRL BLUE (TOWEL DISPOSABLE) ×2 IMPLANT
TRAY CATH LUMEN 1 20CM STRL (SET/KITS/TRAYS/PACK) ×1 IMPLANT
TUBE FEEDING 8FR 16IN STR KANG (MISCELLANEOUS) ×1 IMPLANT
TUBE SUCT INTRACARD DLP 20F (MISCELLANEOUS) ×1 IMPLANT
TUBING ART PRESS 48 MALE/FEM (TUBING) ×2 IMPLANT
TUBING INSUFFLATION 10FT LAP (TUBING) ×2 IMPLANT
UNDERPAD 30X30 INCONTINENT (UNDERPADS AND DIAPERS) ×2 IMPLANT
WATER STERILE IRR 1000ML POUR (IV SOLUTION) ×4 IMPLANT
YANKAUER SUCT BULB TIP NO VENT (SUCTIONS) ×1 IMPLANT

## 2011-03-06 NOTE — H&P (Signed)
                  [address]            [name]          [phone]       [name] [name] #[id number] Date of Birth: 05/07/1940  [name], Brandon R, MD SHAW,W DOUGLAS, MD, MD  Chief Complaint:    Chief Complaint  Patient presents with  . Coronary Artery Disease     eval for Redo CABG    History of Present Illness:     The patient is to the office at the request of Dr. Turner after cardiac catheterization done last week. He undergone coronary bypass grafting 02/01/1996 at which time he had CA BG x 5 with the left internal mammary to the LAD, saphenous vein graft to the first and second diagonal, saphenous vein graft to the obtuse marginal,  saphenous vein graft to the right coronary artery. Since that time he's had AICD placed on 2 different occasions. He's had an angioplasty of the right coronary vein graft. Serial imaging showed decline in LV function until now he has ejection fraction of 27%. He notes the recent onset of substernal chest discomfort and exertional shortness of breath and repeat stress test was performed by Dr. Turner. He comes to the office today for consideration of redo coronary bypass grafting.   Current Activity/ Functional Status: Patient is independent with mobility/ambulation, transfers, ADL's, IADL's.   Past Medical History  Diagnosis Date  . Ischemic cardiomyopathy     s/p ICD Implantation by Dr Edmunds  . Chronic systolic dysfunction of left ventricle     EF 30%  . Diabetes mellitus   . CAD (coronary artery disease)     s/p CABG 1997, PCI (BMS) of SVG to RCA 3/09  . Hyperlipemia   . DJD (degenerative joint disease)   . Gout     Past Surgical History  Procedure Date  . Coronary stent placement 3/09    BMS to SVG to RCA  . Cardiac defibrillator placement 5/06    generator replacement with new RV lead placed by Dr Edmunds 2011  . Back surgery 2003  . Coronary artery bypass graft 01/1996    CABG x 5 LIMA to LAD SVG to diag1,2,svg to om,svg to RCA    History  Smoking status  . Never Smoker   Smokeless tobacco  . Not on file    History  Alcohol Use  . 0.6 oz/week  . 1 Cans of beer per week    occasional    History   Social History  . Marital Status: Married    Spouse Name: N/A    Number of Children: N/A  . Years of Education: N/A   Occupational History  . Not on file.   Social History Main Topics  . Smoking status: Never Smoker   . Smokeless tobacco: Not on file  . Alcohol Use: 0.6 oz/week    1 Cans of beer per week     occasional  . Drug Use: No  . Sexually Active: Not on file   Other Topics Concern  . Not on file   Social History Narrative   Lives in 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  He collects antique metal toys    Allergies  Allergen Reactions  . Codeine   . Latex Rash    Current Outpatient Prescriptions  Medication Sig   Dispense Refill  . allopurinol (ZYLOPRIM) 100 MG tablet Take 2 tablets by mouth once daily       . aspirin 81 MG tablet Take 325 mg by mouth daily.       . atorvastatin (LIPITOR) 40 MG tablet Take 40 mg by mouth daily.        . carvedilol (COREG) 25 MG tablet Take 25 mg by mouth 2 (two) times daily.        . eplerenone (INSPRA) 25 MG tablet Take 25 mg by mouth daily.        . furosemide (LASIX) 40 MG tablet Take 40 mg by mouth as needed.        . insulin aspart protamine-insulin aspart (NOVOLOG 70/30) (70-30) 100 UNIT/ML injection Inject 68 Units into the skin as directed.       . insulin glargine (LANTUS) 100 UNIT/ML injection Inject 40 Units into the skin daily.        . Liraglutide (VICTOZA [place]) Inject into the skin as directed.        . niacin (NIASPAN) 500 MG CR tablet Take 2 tablets by mouth two times a day       . venlafaxine (EFFEXOR-XR) 150 MG 24 hr capsule Take 150 mg by mouth daily.        . zolpidem (AMBIEN CR) 12.5 MG CR tablet Take 12.5 mg by mouth at bedtime as needed.        .  amLODipine-benazepril (LOTREL) 5-20 MG per capsule Take 1 capsule by mouth daily.        . colesevelam (WELCHOL) 625 MG tablet Take 1,875 mg by mouth.        . omega-3 acid ethyl esters (LOVAZA) 1 G capsule Take 2 capsules by mouth 2 (two) times daily.        . QUEtiapine (SEROQUEL XR) 200 MG 24 hr tablet Take 200 mg by mouth at bedtime.        . triamcinolone (KENALOG) 0.1 % cream Apply sparingly to affected area externally two times a day as needed          Family History  Problem Relation Age of Onset  . Coronary artery disease     Both his mother and father died at age 68 of unknown causes he does note that his mother had diabetes  Review of Systems:     Cardiac Review of Systems: Y or N  Chest Pain [  Y  ]  Resting SOB [ N  ] Exertional SOB  [ N  ]  Orthopnea [Y  ]   Pedal Edema [Y   ]    Palpitations [ N ] Syncope  [N  ]   Presyncope [  N ]   General Review of Systems:   Constitional: recent weight change [ n ]; anorexia [n  ]; fatigue [ n ]; nausea [n  ];  night sweats [ n ],fever[n] or chills[ n ]                                                      Eye : No visual changes or amaurosis  Resp: Denies hemoptysis he does have exertional shortness of breath but no resting shortness of breath      GI:  He notes nocturia x1 no blood in stool or urine.    Skin: He notes a rash with previous surgery and tape   Musculosketetal: Denies any joint pain   Heme/Lymph: Denies easy bruisability  Neuro: Denies any TIAs  Psych: Denies psychiatric history  Endocrine: diabetes,   Immunizations: He is unsure of his immunizations  Other: Negative     Physical Exam: BP 106/64  Pulse 88  Resp 20  Ht 5' 9" (1.753 m)  Wt 230 lb (104.327 kg)  BMI 33.96 kg/m2  SpO2 96%  General appearance: alert, cooperative and appears stated age Neurologic: intact Heart: regular rate and rhythm, S1, S2 normal, no murmur, click, rub or gallop, normal apical impulse and prominent apical impulse Lungs:  clear to auscultation bilaterally and normal percussion bilaterally Abdomen: soft, non-tender; bowel sounds normal; no masses,  no organomegaly Extremities: extremities normal, atraumatic, no cyanosis or edema, Homans sign is negative, no sign of DVT, no edema, redness or tenderness in the calves or thighs and no ulcers, gangrene or trophic changes   the patient is left-handed, Previous vein was harvested completely from the left leg no incisions in the right leg   Diagnostic Studies & Laboratory data:      Recent Radiology Findings:   No results found.    Recent Lab Findings: Lab Results  Component Value Date   WBC 13.7* 05/16/2009   HGB 15.1 05/16/2009   HCT 46.2 05/16/2009   PLT 240 05/16/2009   GLUCOSE 232* 05/16/2009   ALT 16 05/16/2009   AST 24 05/16/2009   NA 141 05/16/2009   K 4.8 05/16/2009   CL 102 05/16/2009   CREATININE 1.3 05/16/2009   BUN 27* 05/16/2009   CO2 26 05/16/2009       BNP   103   Cath: RESULTS:   1. Left main coronary artery is widely patent in the proximal and       midportion, but in the distal portion there is an 80% narrowing.       The vessel then bifurcates in the left anterior descending artery       and left circumflex artery.   2. Left anterior descending artery is patent in its proximal origin       giving rise to a first diagonal branch which is small but patent.       The LAD is then occluded.   3. The left circumflex is patent in its proximal portion giving rise       to a small first obtuse marginal branch and a second obtuse       marginal branch.  At the takeoff of the second obtuse marginal,       there is an 80% narrowing of the left circumflex.  The ongoing left       circumflex traverses the AV groove and gives rise to a large third       obtuse marginal branch.   4. The right coronary artery is occluded proximally.   5. LIMA to the LAD is widely patent with evidence of distal LAD flow       with a widely patent distal LAD.  There is  evidence of left-to-left       collaterals filling the second diagonal.  The saphenous vein graft       to the diagonal is occluded.  The saphenous vein graft to the       obtuse marginal is occluded.  The saphenous vein graft to the right       coronary artery has an 80%   lesion proximal to the insertion site in       the right coronary artery.      Left ventriculography shows severe LV dysfunction, EF 20-25%.  LVEDP 15   mmHg.  Aortic pressure 98/55 mmHg, LV pressure 100/15 mmHg.      ASSESSMENT:   1. Severe 3-vessel coronary disease.   2. Left internal mammary artery to the left anterior descending       patent.  Saphenous vein graft to the right coronary artery with 80%       lesion prior to the insertion into the distal right coronary       artery, saphenous vein graft to the diagonal occluded, saphenous       vein graft to the obtuse marginal occluded.   3. Severe left ventricular dysfunction, ejection fraction 20-25%.     Lexiscan 01/26/2011 [facility]:EF 27%, Marked Perfusion de defect is seen in the basal inferior lateral and mild mid inferior lateral regions. Moderate to severe perfusion defect is seen in the basal inferior septal basal inferior mid inferior septal mid inferior and apical inferior region consistent with scar there is mild perfusion defect due to scar with mild peri-infarct ischemia in the basal anterior and mid anterior regions.    Last ECHO 05/04/2009    Assessment / Plan:      Patient is seen in the office with history of coronary artery bypass grafting x5, patient is now having symptoms of angina. It appears that his recent stress test is more positive than previous last year. He has critical coronary anatomy with with areas of the lateral and inferior wall jeopardized. The current anatomy would make angioplasty difficult, the downside is risk of redo surgery and significant LV dysfunction with significant inferior scar. I discussed with the patient and his wife  the risk of redo operation versus medical treatment. At this point we are considering redo coronary artery bypass grafting with at least graft to the right and obtuse marginal. The patient and his wife are aware of the increased risk of bypass surgery in the current setting because of his poor LV function and reduce status. However with recurrent symptoms and critical anatomy his options are limited.  He has not had an echo cardiogram since January of 2011 this will be repeated as will Doppler studies and PFTs and I will see him in the office to further discuss and plan surgery.      Charlea Nardo B Dahiana Kulak MD 02/06/2011 5:57 PM        

## 2011-03-06 NOTE — Transfer of Care (Signed)
Immediate Anesthesia Transfer of Care Note  Patient: [name]  Procedure(s) Performed:  REDO CORONARY ARTERY BYPASS GRAFTING (CABG) - times two grafts using right saphenous vein harvested endoscopically.  Patient Location: PACU  Anesthesia Type: General  Level of Consciousness: sedated  Airway & Oxygen Therapy: Patient remains intubated per anesthesia plan  Post-op Assessment: Report given to PACU RN  Post vital signs: stable  Complications: No apparent anesthesia complications

## 2011-03-06 NOTE — Anesthesia Preprocedure Evaluation (Addendum)
Anesthesia Evaluation  Patient identified by MRN, date of birth, ID band Patient awake    Reviewed: Allergy & Precautions, H&P , NPO status , Patient's Chart, lab work & pertinent test results, reviewed documented beta blocker date and time   Airway Mallampati: II TM Distance: <3 FB Neck ROM: Full    Dental  (+) Upper Dentures and Lower Dentures   Pulmonary neg pulmonary ROS,  clear to auscultation        Cardiovascular hypertension, Pt. on medications and Pt. on home beta blockers + CAD, + Past MI (Echo report shows moderate LV dysfunction with 27% EF) and + CABG (last CABG in 1997) + Cardiac Defibrillator ([employer] at bedside in holding room to interrogate ) Regular Normal    Neuro/Psych PSYCHIATRIC DISORDERS Depression Negative Neurological ROS     GI/Hepatic   Endo/Other  Diabetes mellitus-, Poorly Controlled, Insulin Dependent  Renal/GU      Musculoskeletal   Abdominal (+)  Abdomen: soft. Bowel sounds: normal.  Peds  Hematology   Anesthesia Other Findings   Reproductive/Obstetrics                         Anesthesia Physical Anesthesia Plan  ASA: IV  Anesthesia Plan: General   Post-op Pain Management:    Induction: Intravenous  Airway Management Planned: Oral ETT  Additional Equipment: Arterial line, PA Cath and TEE  Intra-op Plan:   Post-operative Plan: Post-operative intubation/ventilation  Informed Consent: I have reviewed the patients History and Physical, chart, labs and discussed the procedure including the risks, benefits and alternatives for the proposed anesthesia with the patient or authorized representative who has indicated his/her understanding and acceptance.     Plan Discussed with: CRNA and Surgeon  Anesthesia Plan Comments:         Anesthesia Quick Evaluation

## 2011-03-06 NOTE — Anesthesia Postprocedure Evaluation (Signed)
  Anesthesia Post-op Note  Patient: [name]  Procedure(s) Performed:  REDO CORONARY ARTERY BYPASS GRAFTING (CABG) - times two grafts using right saphenous vein harvested endoscopically.  Patient Location: SICU  Anesthesia Type: General  Level of Consciousness: Patient remains intubated per anesthesia plan  Airway and Oxygen Therapy: Patient remains intubated per anesthesia plan  Post-op Pain: mild  Post-op Assessment: Post-op Vital signs reviewed  Post-op Vital Signs: stable  Complications: No apparent anesthesia complications

## 2011-03-06 NOTE — Anesthesia Procedure Notes (Signed)
Procedures

## 2011-03-06 NOTE — Brief Op Note (Signed)
03/06/2011  2:46 PM  PATIENT:  Lowella Dandy  70 y.o. male  PRE-OPERATIVE DIAGNOSIS:  CAD  POST-OPERATIVE DIAGNOSIS: CAD PROCEDURE:  Procedure(s): REDO CORONARY ARTERY BYPASS GRAFTING (CABG) x2 with rt leg EVH, Placement of Left femoral aline  SURGEON:  Surgeon(s): Delight Ovens, MD Mikey Bussing, MD  PHYSICIAN ASSISTANT:   ASSISTANTS: Cecile Sheerer SA  ANESTHESIA:   general  EBL:  Total I/O In: 4900 [I.V.:4000; Blood:900] Out: 1925 [Urine:1055; Blood:870]  BLOOD ADMINISTERED:none and  CC CELLSAVER  DRAINS: Nasogastric Tube, Urinary Catheter (Foley), 2 Chest Tube(s) in the pericardium and 2 a 2 v wires     SPECIMEN:  No Specimen  DISPOSITION OF SPECIMEN:  N/A  COUNTS:  YES   DICTATION: .Other Dictation: Dictation Number [id number]    PATIENT DISPOSITION:  ICU - intubated and hemodynamically stable.   Delay start of Pharmacological VTE agent (>24hrs) due to surgical blood loss or risk of bleeding: yes

## 2011-03-06 NOTE — Procedures (Signed)
Extubation Procedure Note  Patient Details:   Name: [name] DOB: May 18, 1940 MRN: [id number]   Airway Documentation:  AIRWAYS 8 mm (Active)  Secured at (cm) 23 cm 03/06/2011 12:00 AM     Airway 8 mm (Active)  Secured at (cm) 20 cm 03/06/2011  4:15 PM  Measured From Lips 03/06/2011  7:15 PM  Secured Location Right 03/06/2011  7:15 PM  Secured By Caron Presume Tape 03/06/2011  7:15 PM    Evaluation  O2 sats: stable throughout Complications: No apparent complications Patient did tolerate procedure well. Bilateral Breath Sounds: Clear   Yes   Clent Ridges 03/06/2011, 8:41 PM

## 2011-03-06 NOTE — Interval H&P Note (Signed)
History and Physical Interval Note:   03/06/2011   7:34 AM   [name]  has presented today for surgery, with the diagnosis of CAD  The various methods of treatment have been discussed with the patient and family. After consideration of risks, benefits and other options for treatment, the patient has consented to  Procedure(s): REDO CORONARY ARTERY BYPASS GRAFTING (CABG) as a surgical intervention .  The patients' history has been reviewed, patient examined, no change in status, stable for surgery.  I have reviewed the patients' chart and labs.  Questions were answered to the patient's satisfaction.     Brandon Ovens  MD  Patient had the pulmonary function studies done October 25 10 showing an FEV1 of 2.91 98% of predicted with diffusion capacity 80% of predicted.  Ech[facility]ogram was performed at [facility] but was a suboptimal study patient confirms the known decrease in LV function with at least moderate LV dysfunction no significant valvular abnormalities were noted mitral valve is noted to be mildly thickened with trivial regurgitation.  Carotid Doppler studies showed mild calcific plaque in the origin of the ICA without stenosis Doppler flow in the right palmar arch remain normal with radial compression and obliterates with ulnar compression the left palmar arch showed 50% reduction with radial compression remained normal with ulnar compression.

## 2011-03-06 NOTE — Progress Notes (Signed)
Patient examined and record reviewed.Hemodynamics stable,labs satisfactory.Patient had redo CABG.Continue current care.Intubated, not bleeding VAN TRIGT III,Ether Goebel 03/06/2011

## 2011-03-07 ENCOUNTER — Inpatient Hospital Stay (HOSPITAL_COMMUNITY): Payer: Managed Care, Other (non HMO)

## 2011-03-07 LAB — BASIC METABOLIC PANEL
BUN: 16 mg/dL (ref 6–23)
CO2: 24 mEq/L (ref 19–32)
Calcium: 7.9 mg/dL — ABNORMAL LOW (ref 8.4–10.5)
Chloride: 109 mEq/L (ref 96–112)
Creatinine, Ser: 1.07 mg/dL (ref 0.50–1.35)
GFR calc Af Amer: 80 mL/min — ABNORMAL LOW (ref >90)
GFR calc non Af Amer: 69 mL/min — ABNORMAL LOW (ref >90)
Glucose, Bld: 136 mg/dL — ABNORMAL HIGH (ref 70–99)
Potassium: 4.4 mEq/L (ref 3.5–5.1)
Sodium: 140 mEq/L (ref 135–145)

## 2011-03-07 LAB — GLUCOSE, CAPILLARY
Glucose-Capillary: 115 mg/dL — ABNORMAL HIGH (ref 70–99)
Glucose-Capillary: 117 mg/dL — ABNORMAL HIGH (ref 70–99)
Glucose-Capillary: 121 mg/dL — ABNORMAL HIGH (ref 70–99)
Glucose-Capillary: 126 mg/dL — ABNORMAL HIGH (ref 70–99)
Glucose-Capillary: 130 mg/dL — ABNORMAL HIGH (ref 70–99)
Glucose-Capillary: 132 mg/dL — ABNORMAL HIGH (ref 70–99)
Glucose-Capillary: 132 mg/dL — ABNORMAL HIGH (ref 70–99)
Glucose-Capillary: 134 mg/dL — ABNORMAL HIGH (ref 70–99)
Glucose-Capillary: 137 mg/dL — ABNORMAL HIGH (ref 70–99)
Glucose-Capillary: 83 mg/dL (ref 70–99)
Glucose-Capillary: 106 mg/dL — ABNORMAL HIGH (ref 70–99)
Glucose-Capillary: 123 mg/dL — ABNORMAL HIGH (ref 70–99)
Glucose-Capillary: 125 mg/dL — ABNORMAL HIGH (ref 70–99)
Glucose-Capillary: 126 mg/dL — ABNORMAL HIGH (ref 70–99)
Glucose-Capillary: 128 mg/dL — ABNORMAL HIGH (ref 70–99)
Glucose-Capillary: 131 mg/dL — ABNORMAL HIGH (ref 70–99)
Glucose-Capillary: 142 mg/dL — ABNORMAL HIGH (ref 70–99)
Glucose-Capillary: 147 mg/dL — ABNORMAL HIGH (ref 70–99)
Glucose-Capillary: 154 mg/dL — ABNORMAL HIGH (ref 70–99)
Glucose-Capillary: 146 mg/dL — ABNORMAL HIGH (ref 70–99)
Glucose-Capillary: 192 mg/dL — ABNORMAL HIGH (ref 70–99)

## 2011-03-07 LAB — POCT I-STAT, CHEM 8
BUN: 20 mg/dL (ref 6–23)
Calcium, Ion: 1.11 mmol/L — ABNORMAL LOW (ref 1.12–1.32)
Chloride: 102 mEq/L (ref 96–112)
Creatinine, Ser: 1.3 mg/dL (ref 0.50–1.35)
Glucose, Bld: 160 mg/dL — ABNORMAL HIGH (ref 70–99)
HCT: 31 % — ABNORMAL LOW (ref 39.0–52.0)
Hemoglobin: 10.5 g/dL — ABNORMAL LOW (ref 13.0–17.0)
Potassium: 4 mEq/L (ref 3.5–5.1)
Sodium: 137 mEq/L (ref 135–145)
TCO2: 23 mmol/L (ref 0–100)

## 2011-03-07 LAB — CREATININE, SERUM
Creatinine, Ser: 0.83 mg/dL (ref 0.50–1.35)
GFR calc Af Amer: >90 mL/min (ref >90)
GFR calc non Af Amer: 88 mL/min — ABNORMAL LOW (ref >90)

## 2011-03-07 LAB — MAGNESIUM
Magnesium: 2 mg/dL (ref 1.5–2.5)
Magnesium: 2.1 mg/dL (ref 1.5–2.5)

## 2011-03-07 LAB — POCT I-STAT 3, ART BLOOD GAS (G3+)
Bicarbonate: 25.4 mEq/L — ABNORMAL HIGH (ref 20.0–24.0)
O2 Saturation: 89 %
Patient temperature: 38.7
TCO2: 27 mmol/L (ref 0–100)
pCO2 arterial: 45.8 mmHg — ABNORMAL HIGH (ref 35.0–45.0)
pH, Arterial: 7.359 (ref 7.350–7.450)
pO2, Arterial: 66 mmHg — ABNORMAL LOW (ref 80.0–100.0)

## 2011-03-07 LAB — CBC
HCT: 31.4 % — ABNORMAL LOW (ref 39.0–52.0)
HCT: 30.6 % — ABNORMAL LOW (ref 39.0–52.0)
Hemoglobin: 10.2 g/dL — ABNORMAL LOW (ref 13.0–17.0)
Hemoglobin: 10 g/dL — ABNORMAL LOW (ref 13.0–17.0)
MCH: 29.6 pg (ref 26.0–34.0)
MCH: 30 pg (ref 26.0–34.0)
MCHC: 32.5 g/dL (ref 30.0–36.0)
MCHC: 32.7 g/dL (ref 30.0–36.0)
MCV: 91 fL (ref 78.0–100.0)
MCV: 91.9 fL (ref 78.0–100.0)
Platelets: 132 10*3/uL — ABNORMAL LOW (ref 150–400)
Platelets: 120 10*3/uL — ABNORMAL LOW (ref 150–400)
RBC: 3.45 MIL/uL — ABNORMAL LOW (ref 4.22–5.81)
RBC: 3.33 MIL/uL — ABNORMAL LOW (ref 4.22–5.81)
RDW: 14.4 % (ref 11.5–15.5)
RDW: 14.6 % (ref 11.5–15.5)
WBC: 14.9 10*3/uL — ABNORMAL HIGH (ref 4.0–10.5)
WBC: 12.1 10*3/uL — ABNORMAL HIGH (ref 4.0–10.5)

## 2011-03-07 MED ORDER — FENTANYL CITRATE 0.05 MG/ML IJ SOLN
INTRAMUSCULAR | Status: AC
  Filled 2011-03-07: qty 2

## 2011-03-07 MED ORDER — SODIUM CHLORIDE 0.9 % IV SOLN: INTRAVENOUS | Status: DC | PRN

## 2011-03-07 MED ORDER — INSULIN GLARGINE 100 UNIT/ML ~~LOC~~ SOLN
25.0000 [IU] | SUBCUTANEOUS | Status: DC
  Filled 2011-03-07: qty 3

## 2011-03-07 MED ORDER — MORPHINE SULFATE 2 MG/ML IJ SOLN: 1.0000 mg | INTRAMUSCULAR | Status: AC | PRN

## 2011-03-07 MED ORDER — HALOPERIDOL LACTATE 5 MG/ML IJ SOLN
INTRAMUSCULAR | Status: AC
  Administered 2011-03-07: 4 mg via INTRAVENOUS
  Filled 2011-03-07: qty 1

## 2011-03-07 MED ORDER — FUROSEMIDE 10 MG/ML IJ SOLN
20.0000 mg | Freq: Three times a day (TID) | INTRAMUSCULAR | Status: AC
  Administered 2011-03-07 – 2011-03-08 (×4): 20 mg via INTRAVENOUS
  Filled 2011-03-07 (×3): qty 2

## 2011-03-07 MED ORDER — LEVALBUTEROL HCL 1.25 MG/0.5ML IN NEBU
1.2500 mg | INHALATION_SOLUTION | Freq: Four times a day (QID) | RESPIRATORY_TRACT | Status: DC
  Administered 2011-03-07 – 2011-03-13 (×25): 1.25 mg via RESPIRATORY_TRACT
  Filled 2011-03-07 (×30): qty 0.5

## 2011-03-07 MED ORDER — INSULIN ASPART 100 UNIT/ML ~~LOC~~ SOLN: 0.0000 [IU] | SUBCUTANEOUS | Status: DC

## 2011-03-07 MED ORDER — HALOPERIDOL LACTATE 5 MG/ML IJ SOLN
2.0000 mg | INTRAMUSCULAR | Status: DC | PRN
  Administered 2011-03-07: 4 mg via INTRAVENOUS
  Administered 2011-03-07 – 2011-03-09 (×2): 2 mg via INTRAVENOUS
  Filled 2011-03-07 (×2): qty 1

## 2011-03-07 MED ORDER — INSULIN GLARGINE 100 UNIT/ML ~~LOC~~ SOLN
25.0000 [IU] | Freq: Two times a day (BID) | SUBCUTANEOUS | Status: DC
  Administered 2011-03-07 – 2011-03-13 (×12): 25 [IU] via SUBCUTANEOUS
  Filled 2011-03-07: qty 3

## 2011-03-07 MED ORDER — FENTANYL CITRATE 0.05 MG/ML IJ SOLN: 25.0000 ug | INTRAMUSCULAR | Status: DC | PRN

## 2011-03-07 MED ORDER — MORPHINE SULFATE 4 MG/ML IJ SOLN
INTRAMUSCULAR | Status: AC
  Filled 2011-03-07: qty 1

## 2011-03-07 MED ORDER — FENTANYL CITRATE 0.05 MG/ML IJ SOLN
25.0000 ug | INTRAMUSCULAR | Status: DC | PRN
  Administered 2011-03-07 (×3): 25 ug via INTRAVENOUS
  Filled 2011-03-07: qty 2

## 2011-03-07 NOTE — Progress Notes (Signed)
Pt has become increasingly restless, agitated and belligerent.  Pt states he was thirsty...offered ice chips but pt refused.  Pt states he is in pain and morphine was set to be administered and pt refused.  Pt refused to have his CBG checked for the glucostablizer.  Pt family was called and situation was discussed.  Pt wife stated pt had same type of reaction after his previous heart surgery.  Dr. Donata Clay paged.

## 2011-03-07 NOTE — Progress Notes (Signed)
Late Entry added to extubation note:  Pt. Nif -32, VC 650, Pt. Had good effort.

## 2011-03-07 NOTE — Progress Notes (Signed)
1 Day Post-Op Procedure(s) (LRB): REDO CORONARY ARTERY BYPASS GRAFTING (CABG) (N/A)                     [address]            [place] [address]          [phone]   Subjective:   Delerium this am req IV haldol Hemodynamics stable on DOP Fluid overloaded, CXR mild edema DM controlled w/ Lantus + SSI  Objective: Vital signs in last 24 hours: Temp:  [95.5 F (35.3 C)-101.8 F (38.8 C)] 99.3 F (37.4 C) (11/20 0727) Pulse Rate:  [64-91] 77  (11/20 0852) Cardiac Rhythm:  [-] Atrial fibrillation (11/20 0039) Resp:  [12-29] 17  (11/20 0852) BP: (80-116)/(35-65) 106/57 mmHg (11/19 2200) SpO2:  [90 %-100 %] 95 % (11/20 0852) Arterial Line BP: (82-130)/(37-63) 130/63 mmHg (11/20 0700) FiO2 (%):  [39.8 %-100 %] 40.1 % (11/19 2000) Weight:  [256 lb 2.8 oz (116.2 kg)] 256 lb 2.8 oz (116.2 kg) (11/20 0500)  Hemodynamic parameters for last 24 hours: PAP: (37-49)/(19-27) 49/27 mmHg CO:  [5.2 L/min-7.6 L/min] 7 L/min CI:  [2.3 L/min/m2-3.4 L/min/m2] 3.1 L/min/m2  Intake/Output from previous day: 11/19 0701 - 11/20 0700 In: 8336.9 [I.V.:6016.9; Blood:900; IV Piggyback:1420] Out: 3718 [Urine:2350; Emesis/NG output:200; Blood:870; Chest Tube:298] Intake/Output this shift:      Lab Results:  Basename 03/07/11 0416 03/06/11 2200 03/06/11 1530  WBC 14.9* -- 14.2*  HGB 10.2* 11.2* --  HCT 31.4* 33.0* --  PLT 132* -- 143*   BMET:  Basename 03/07/11 0416 03/06/11 2200  NA 140 141  K 4.4 4.4  CL 109 105  CO2 24 --  GLUCOSE 136* 131*  BUN 16 14  CREATININE 1.07 0.90  CALCIUM 7.9* --    PT/INR:  Basename 03/06/11 1530  LABPROT 19.7*  INR 1.64*   ABG    Component Value Date/Time   PHART 7.359 03/06/2011 2359   HCO3 25.4* 03/06/2011 2359   TCO2 27 03/06/2011 2359   ACIDBASEDEF 3.0* 03/06/2011 2154   O2SAT 89.0 03/06/2011 2359   CBG (last 3)   Basename 03/07/11 0203 03/07/11 0100 03/06/11 2357  GLUCAP 106* 83 130*    Assessment/Plan: S/P  Procedure(s) (LRB): REDO CORONARY ARTERY BYPASS GRAFTING (CABG) (N/A) See progression orders   LOS: 1 day    VAN TRIGT III,Kada Friesen 03/07/2011

## 2011-03-07 NOTE — Op Note (Signed)
[name], [name] NO.:  [id number]  MEDICAL RECORD NO.:  [id number]  LOCATION:  2315                         FACILITY:  [facility]  PHYSICIAN:  Sheliah Plane, MD    DATE OF BIRTH:  [birth year]  DATE OF PROCEDURE:  03/06/2011 DATE OF DISCHARGE:                              OPERATIVE REPORT   PREOPERATIVE DIAGNOSIS:  Recurrent coronary occlusive disease, status post coronary artery bypass grafting in 1997.  POSTOPERATIVE DIAGNOSIS:  Recurrent coronary occlusive disease, status post coronary artery bypass grafting in 1997.  SURGICAL PROCEDURE:  Redo coronary artery bypass grafting x2 with reverse saphenous vein graft to the posterior descending coronary artery and reverse saphenous vein graft to the circumflex coronary artery with right leg endo vein harvesting.  Placement of left femoral arterial line.  SURGEON:  Sheliah Plane, MD  FIRST ASSISTANT:  Kerin Perna, MD  SECOND ASSISTANT:  Cecile Sheerer, SA  BRIEF HISTORY:  The patient is a 70 year old male with a long history of cardiac disease having had coronary artery bypass grafting in 1997.  At that time, he had left internal mammary to the LAD, a sequential vein graft to 2 small diagonal branches, reverse saphenous vein graft to the circumflex into the right.  Over the years, he has had defibrillator placed and had stents placed in the right vein graft.  He presented with recurrent anginal symptoms.  Dr. Armanda Magic performed cardiac catheterization which showed significant decrease in LV function with ejection fraction of 30%.  He had progression of disease in the right coronary artery and in the distal portion of the right vein graft.  Vein graft to the circumflex was occluded with large segment of lateral wall jeopardized by left main and proximal circumflex disease.  The LAD is totally occluded with a patent mammary supplying a good size distal LAD. Because of the patient's symptoms and  unsuitability for any interventional procedure, redo coronary artery bypass grafting was discussed and recommended to the patient who agreed and signed informed consent.  The risks of surgery were discussed in detail including the increased risk secondary to his diabetes, redo status, poor LV function. The patient was agreeable and signed informed consent.  DESCRIPTION OF PROCEDURE:  With Swan-Ganz and arterial line monitors in place, the patient underwent general endotracheal anesthesia without incidence.  Skin of the chest and legs prepped were prepped with Betadine and draped in usual sterile manner.  TEE probe was placed and findings are dictated under separate note.  The patient did have global hypokinesis, trace mitral regurgitation.  TE was also used for monitoring LV function after separation from bypass.  Using the [employer] endo vein harvesting system, vein was harvested from the right thigh endoscopically and was of good quality and caliber.  Median sternotomy was performed with a sagittal saw.  The underlying mediastinum was dissected off the posterior table of the sternum.  After tedious dissection, the right atrium was freed.  The ascending aorta was freed. The left internal mammary was freed up to allow placement of the bulldog clamp on the artery without injuring it.  The patient was then systemically heparinized.  The ascending aorta was cannulated. Retrograde cardioplegic  catheter was placed.  Dual stage right atrial cannula was placed.  The patient was placed on cardiopulmonary bypass 2.4 L/minute per meter squared.  The remainder of the dissection of the left ventricle was carried out to allow access to the circumflex coronary artery.  The patient's body temperature was then cooled to 32 degrees.  The aortic cross-clamp was applied.  800 mL cold blood potassium cardioplegia was administered antegrade.  Bulldog was placed on the mammary artery.  Additional retrograde  cardioplegia was placed. Attention was turned first to the distal right coronary artery, which was totally calcified in the proximal third of the posterior descending. The vessel was small but was suitable for bypass.  Vessel was opened, admitted a 1 mm probe distally.  The vessel was approximately 1.3-1.4 mm in size.  Using a running 7-0 Prolene, distal anastomosis was performed. Additional cold blood cardioplegia was administered down the vein graft and also retrograde.  The heart was then elevated.  This was with some difficulty because of his significant left ventricular hypertrophy.  We were able to identify the circumflex and distal to the previous insertion site of the graft, the vessel was opened, admitted a 1.5 mm probe using a running 7-0 Prolene.  Segmented reverse saphenous vein graft was anastomosed to the circumflex coronary artery.  Additional cold blood cardioplegia was administered down the vein grafts and also retrograde with cross-clamp still in place. Two punch aortotomies were performed.  These were performed in the hood of the old vein graft to the right and old vein graft to the diagonal.  Each of these proximal anastomoses were performed with a running 6-0 Prolene.  Air was evacuated from the grafts and the ascending aorta as the aortotomies were completed.  The bulldog on the mammary artery was removed with rise in myocardial septal temperature.  Aortic cross-clamp was removed with total cross-clamp time of 92 minutes.  The patient spontaneously converted to a sinus rhythm.  Sites of anastomosis were inspected and free of bleeding.  With manipulation of the heart, the patient did have episode of ventricular tachycardia.  He required electric defibrillation.  With the body temperature rewarmed to 37 degrees, he was then ventilated and weaned from cardiopulmonary bypass on low-dose milrinone and dopamine infusion.  He was decannulated in the usual fashion.   Protamine sulfate was administered with operative field hemostatic.  Atrial and ventricular pacing wires had been applied.  The previously placed graft markers were still applicable to his new grafts and no further graft markers were placed.  The mediastinal fat was covered over the ascending aorta.  Two Blake drains were left in mediastinum.  The sternum was closed with #6 stainless steel wire. Fascia was closed with interrupted 0 Vicryl and 3-0 Vicryl subcutaneous tissue, 4-0 subcuticular stitch in skin edges.  Dry dressings were applied.  Sponge and needle count was reported as correct at completion of procedure.  The patient tolerated the procedure without obvious complication and was transferred to Surgical Intensive Care Unit. Because of the patient's right radial arterial line was functioning poorly, a left femoral arterial line was placed percutaneously before separation from bypass.  Total pump time was 166 minutes.  The patient did not require any blood bank, blood products during the operative procedure.     Sheliah Plane, MD     EG/MEDQ  D:  03/07/2011  T:  03/07/2011  Job:  [id number]  cc:   Armanda Magic, M.D.

## 2011-03-07 NOTE — Progress Notes (Signed)
1 Day Post-Op Procedure(s) (LRB): REDO CORONARY ARTERY BYPASS GRAFTING (CABG) (N/A) Subjective: ambulated short distance , confusion improved  Objective: Vital signs in last 24 hours: Temp:  [97.9 F (36.6 C)-101.8 F (38.8 C)] 98.5 F (36.9 C) (11/20 1859) Pulse Rate:  [69-88] 86  (11/20 1800) Cardiac Rhythm:  [-] Atrial fibrillation (11/20 0039) Resp:  [11-29] 23  (11/20 1800) BP: (84-116)/(44-68) 97/49 mmHg (11/20 1800) SpO2:  [90 %-100 %] 90 % (11/20 1800) Arterial Line BP: (97-130)/(47-63) 113/57 mmHg (11/20 0800) FiO2 (%):  [39.8 %-40.2 %] 40.1 % (11/19 2000) Weight:  [256 lb 2.8 oz (116.2 kg)] 256 lb 2.8 oz (116.2 kg) (11/20 0500)  Hemodynamic parameters for last 24 hours: PAP: (36-51)/(19-28) 36/23 mmHg CO:  [5.8 L/min-7 L/min] 7 L/min CI:  [2.6 L/min/m2-3.1 L/min/m2] 3.1 L/min/m2  Intake/Output from previous day: 11/19 0701 - 11/20 0700 In: 8336.9 [I.V.:6016.9; Blood:900; IV Piggyback:1420] Out: 3718 [Urine:2350; Emesis/NG output:200; Blood:870; Chest Tube:298] Intake/Output this shift:    General appearance: alert and cooperative Neurologic: intact Not wheezing  Lab Results:  Basename 03/07/11 1722 03/07/11 1700 03/07/11 0416  WBC -- 12.1* 14.9*  HGB 10.5* 10.0* --  HCT 31.0* 30.6* --  PLT -- 120* 132*   BMET:  Basename 03/07/11 1722 03/07/11 1700 03/07/11 0416  NA 137 -- 140  K 4.0 -- 4.4  CL 102 -- 109  CO2 -- -- 24  GLUCOSE 160* -- 136*  BUN 20 -- 16  CREATININE 1.30 0.83 --  CALCIUM -- -- 7.9*    PT/INR:  Basename 03/06/11 1530  LABPROT 19.7*  INR 1.64*   ABG    Component Value Date/Time   PHART 7.359 03/06/2011 2359   HCO3 25.4* 03/06/2011 2359   TCO2 23 03/07/2011 1722   ACIDBASEDEF 3.0* 03/06/2011 2154   O2SAT 89.0 03/06/2011 2359   CBG (last 3)   Basename 03/07/11 1540 03/07/11 1245 03/07/11 1140  GLUCAP 146* 126* 142*    Assessment/Plan: S/P Procedure(s) (LRB): REDO CORONARY ARTERY BYPASS GRAFTING (CABG)  (N/A) Mobilize Diuresis Diabetes control See progression orders   LOS: 1 day    Andre Swander B 03/07/2011

## 2011-03-07 NOTE — Progress Notes (Signed)
Orders received from Dr. Donata Clay for haldol.  Dr. Tyrone Sage called to check on pt and was provided update.

## 2011-03-08 ENCOUNTER — Inpatient Hospital Stay (HOSPITAL_COMMUNITY): Payer: Managed Care, Other (non HMO)

## 2011-03-08 LAB — GLUCOSE, CAPILLARY
Glucose-Capillary: 103 mg/dL — ABNORMAL HIGH (ref 70–99)
Glucose-Capillary: 110 mg/dL — ABNORMAL HIGH (ref 70–99)
Glucose-Capillary: 112 mg/dL — ABNORMAL HIGH (ref 70–99)
Glucose-Capillary: 116 mg/dL — ABNORMAL HIGH (ref 70–99)
Glucose-Capillary: 119 mg/dL — ABNORMAL HIGH (ref 70–99)
Glucose-Capillary: 146 mg/dL — ABNORMAL HIGH (ref 70–99)
Glucose-Capillary: 149 mg/dL — ABNORMAL HIGH (ref 70–99)
Glucose-Capillary: 157 mg/dL — ABNORMAL HIGH (ref 70–99)
Glucose-Capillary: 164 mg/dL — ABNORMAL HIGH (ref 70–99)
Glucose-Capillary: 189 mg/dL — ABNORMAL HIGH (ref 70–99)
Glucose-Capillary: 213 mg/dL — ABNORMAL HIGH (ref 70–99)
Glucose-Capillary: 83 mg/dL (ref 70–99)
Glucose-Capillary: 85 mg/dL (ref 70–99)
Glucose-Capillary: 92 mg/dL (ref 70–99)
Glucose-Capillary: 92 mg/dL (ref 70–99)
Glucose-Capillary: 93 mg/dL (ref 70–99)
Glucose-Capillary: 96 mg/dL (ref 70–99)

## 2011-03-08 LAB — BASIC METABOLIC PANEL
BUN: 21 mg/dL (ref 6–23)
CO2: 26 mEq/L (ref 19–32)
Calcium: 7.9 mg/dL — ABNORMAL LOW (ref 8.4–10.5)
Chloride: 101 mEq/L (ref 96–112)
Creatinine, Ser: 1.16 mg/dL (ref 0.50–1.35)
GFR calc Af Amer: 73 mL/min — ABNORMAL LOW (ref >90)
GFR calc non Af Amer: 63 mL/min — ABNORMAL LOW (ref >90)
Glucose, Bld: 92 mg/dL (ref 70–99)
Potassium: 3.9 mEq/L (ref 3.5–5.1)
Sodium: 133 mEq/L — ABNORMAL LOW (ref 135–145)

## 2011-03-08 LAB — CBC
HCT: 30 % — ABNORMAL LOW (ref 39.0–52.0)
Hemoglobin: 9.7 g/dL — ABNORMAL LOW (ref 13.0–17.0)
MCH: 29.5 pg (ref 26.0–34.0)
MCHC: 32.3 g/dL (ref 30.0–36.0)
MCV: 91.2 fL (ref 78.0–100.0)
Platelets: 106 10*3/uL — ABNORMAL LOW (ref 150–400)
RBC: 3.29 MIL/uL — ABNORMAL LOW (ref 4.22–5.81)
RDW: 14.7 % (ref 11.5–15.5)
WBC: 12.6 10*3/uL — ABNORMAL HIGH (ref 4.0–10.5)

## 2011-03-08 MED ORDER — INSULIN ASPART 100 UNIT/ML ~~LOC~~ SOLN
0.0000 [IU] | SUBCUTANEOUS | Status: DC
  Administered 2011-03-08: 8 [IU] via SUBCUTANEOUS
  Administered 2011-03-08 – 2011-03-10 (×7): 2 [IU] via SUBCUTANEOUS
  Administered 2011-03-10: 4 [IU] via SUBCUTANEOUS
  Administered 2011-03-10 – 2011-03-11 (×3): 2 [IU] via SUBCUTANEOUS
  Administered 2011-03-11: 4 [IU] via SUBCUTANEOUS
  Administered 2011-03-11: 2 [IU] via SUBCUTANEOUS
  Administered 2011-03-11: 4 [IU] via SUBCUTANEOUS
  Administered 2011-03-11: 2 [IU] via SUBCUTANEOUS
  Administered 2011-03-12: 4 [IU] via SUBCUTANEOUS
  Administered 2011-03-12 (×3): 2 [IU] via SUBCUTANEOUS
  Administered 2011-03-12: 4 [IU] via SUBCUTANEOUS
  Administered 2011-03-13: 2 [IU] via SUBCUTANEOUS
  Filled 2011-03-08: qty 3

## 2011-03-08 MED ORDER — FUROSEMIDE 10 MG/ML IJ SOLN
40.0000 mg | Freq: Three times a day (TID) | INTRAMUSCULAR | Status: DC
  Administered 2011-03-08 – 2011-03-09 (×3): 40 mg via INTRAVENOUS
  Filled 2011-03-08 (×5): qty 4

## 2011-03-08 MED ORDER — INSULIN ASPART 100 UNIT/ML ~~LOC~~ SOLN
0.0000 [IU] | SUBCUTANEOUS | Status: AC
  Filled 2011-03-08: qty 3

## 2011-03-08 MED FILL — Potassium Chloride Inj 2 mEq/ML: INTRAVENOUS | Qty: 40 | Status: AC

## 2011-03-08 MED FILL — Electrolyte-R (PH 7.4) Solution: INTRAVENOUS | Qty: 4000 | Status: AC

## 2011-03-08 MED FILL — Heparin Sodium (Porcine) Inj 1000 Unit/ML: INTRAMUSCULAR | Qty: 60 | Status: AC

## 2011-03-08 MED FILL — Sodium Chloride Irrigation Soln 0.9%: Qty: 3000 | Status: AC

## 2011-03-08 MED FILL — Sodium Chloride IV Soln 0.9%: INTRAVENOUS | Qty: 1000 | Status: AC

## 2011-03-08 MED FILL — Dexmedetomidine HCl IV Soln 200 MCG/2ML: INTRAVENOUS | Qty: 2 | Status: AC

## 2011-03-08 MED FILL — Magnesium Sulfate Inj 50%: INTRAMUSCULAR | Qty: 10 | Status: AC

## 2011-03-08 NOTE — Progress Notes (Signed)
Patient ID: [name], male   DOB: [birth year], 70 y.o.   MRN: [id number] Feels better this evening  Good response to Lasix at 40 mg dose.  Wheezing improved.

## 2011-03-08 NOTE — Progress Notes (Signed)
2 Days Post-Op Procedure(s) (LRB): REDO CORONARY ARTERY BYPASS GRAFTING (CABG) (N/A) Subjective: C/o wheezing, breathlessness, swelling   Objective: Vital signs in last 24 hours: Temp:  [97.2 F (36.2 C)-98.5 F (36.9 C)] 98 F (36.7 C) (11/21 1108) Pulse Rate:  [77-107] 89  (11/21 1408) Cardiac Rhythm:  [-] Normal sinus rhythm (11/21 0700) Resp:  [7-28] 18  (11/21 1408) BP: (82-140)/(49-79) 133/76 mmHg (11/21 1300) SpO2:  [90 %-97 %] 95 % (11/21 1408) Weight:  [118.5 kg (261 lb 3.9 oz)] 261 lb 3.9 oz (118.5 kg) (11/21 0630)  Hemodynamic parameters for last 24 hours:    Intake/Output from previous day: 11/20 0701 - 11/21 0700 In: 2102.8 [P.O.:1480; I.V.:522.8; IV Piggyback:100] Out: 1615 [Urine:1585; Chest Tube:30] Intake/Output this shift: Total I/O In: 1002 [P.O.:840; I.V.:110; IV Piggyback:52] Out: 320 [Urine:320]  General appearance: alert and no distress Neurologic: intact Heart: regular rate and rhythm Lungs: wheezes bilaterally Abdomen: normal findings: soft, non-tender  Lab Results:  Basename 03/08/11 0357 03/07/11 1722 03/07/11 1700  WBC 12.6* -- 12.1*  HGB 9.7* 10.5* --  HCT 30.0* 31.0* --  PLT 106* -- 120*   BMET:  Basename 03/08/11 0357 03/07/11 1722 03/07/11 0416  NA 133* 137 --  K 3.9 4.0 --  CL 101 102 --  CO2 26 -- 24  GLUCOSE 92 160* --  BUN 21 20 --  CREATININE 1.16 1.30 --  CALCIUM 7.9* -- 7.9*    PT/INR:  Basename 03/06/11 1530  LABPROT 19.7*  INR 1.64*   ABG    Component Value Date/Time   PHART 7.359 03/06/2011 2359   HCO3 25.4* 03/06/2011 2359   TCO2 23 03/07/2011 1722   ACIDBASEDEF 3.0* 03/06/2011 2154   O2SAT 89.0 03/06/2011 2359   CBG (last 3)   Basename 03/08/11 1141 03/08/11 0728 03/08/11 0659  GLUCAP 157* 93 103*    Assessment/Plan: S/P Procedure(s) (LRB): REDO CORONARY ARTERY BYPASS GRAFTING (CABG) (N/A) Mobilize Diuresis Keep foley catheter for IV diuresis Xopenex + lasix for wheezing   LOS: 2 days     [name] C 03/08/2011

## 2011-03-08 NOTE — Progress Notes (Signed)
Lasix 20 IV given at 1400, new order to start 40 mg iv, Dr. Dorris Fetch stated to given now (in addition to 20 mg), need for foley discussed

## 2011-03-08 NOTE — Progress Notes (Signed)
[facility] DAILY PROGRESS NOTE  2 Days Post-Op Procedure(s) (LRB): REDO CORONARY ARTERY BYPASS GRAFTING (CABG) (N/A)  Subjective: Improving, walked twice around the unit, wheezing this am  Objective: Vital signs in last 24 hours: Patient Vitals for the past 24 hrs:  BP Temp Temp src Pulse Resp SpO2 Weight  03/08/11 1700 154/81 mmHg - - 86  20  90 % -  03/08/11 1600 124/68 mmHg - - 83  9  93 % -  03/08/11 1514 - 97.7 F (36.5 C) Oral - - - -  03/08/11 1500 143/80 mmHg - - 96  24  - -  03/08/11 1408 - - - 89  18  95 % -  03/08/11 1400 - - - 84  7  94 % -  03/08/11 1300 133/76 mmHg - - 107  28  94 % -  03/08/11 1200 - - - 81  14  95 % -  03/08/11 1108 - 98 F (36.7 C) Oral - - - -  03/08/11 1100 131/61 mmHg - - 83  12  94 % -  03/08/11 1000 140/67 mmHg - - 95  25  91 % -  03/08/11 0900 132/68 mmHg - - 95  20  92 % -  03/08/11 0831 - - - - - 97 % -  03/08/11 0800 116/79 mmHg - - 89  18  94 % -  03/08/11 0725 - 97.6 F (36.4 C) Oral - - - -  03/08/11 0700 126/63 mmHg - - 90  18  90 % -  03/08/11 0645 129/78 mmHg - - - 22  - -  03/08/11 0630 121/65 mmHg - - 83  17  95 % 261 lb 3.9 oz (118.5 kg)  03/08/11 0615 - - - 81  10  94 % -  03/08/11 0600 110/59 mmHg - - 82  10  94 % -  03/08/11 0500 105/55 mmHg - - 79  11  94 % -  03/08/11 0438 - 97.2 F (36.2 C) Oral - - - -  03/08/11 0400 109/56 mmHg - - 86  19  94 % -  03/08/11 0345 - - - 88  20  94 % -  03/08/11 0330 96/75 mmHg - - 87  21  94 % -  03/08/11 0315 117/60 mmHg - - 91  23  93 % -  03/08/11 0300 97/78 mmHg - - 88  20  94 % -  03/08/11 0249 - - - - - 94 % -  03/08/11 0245 106/62 mmHg - - 86  19  94 % -  03/08/11 0230 97/64 mmHg - - 82  15  94 % -  03/08/11 0215 - - - 83  16  93 % -  03/08/11 0200 119/60 mmHg - - 88  17  93 % -  03/08/11 0100 103/57 mmHg - - 78  19  95 % -  03/08/11 0000 102/60 mmHg 98.4 F (36.9 C) Oral 80  11  95 % -  03/07/11 2300 96/53 mmHg - - 77  11  94 % -  03/07/11 2207 106/55 mmHg - - 80  - - -    03/07/11 2200 106/55 mmHg - - 80  18  95 % -  03/07/11 2100 123/60 mmHg - - 84  18  94 % -  03/07/11 2045 - - - 86  20  96 % -  03/07/11 2041 82/57 mmHg - - 86  20  94 % -  03/07/11 2000 111/56 mmHg - - 88  20  94 % -  03/07/11 1900 113/61 mmHg - - 88  17  91 % -  03/07/11 1859 - 98.5 F (36.9 C) Oral - - - -      Intake/Output from previous day: 11/20 0701 - 11/21 0700 In: 2102.8 [P.O.:1480; I.V.:522.8; IV Piggyback:100] Out: 1615 [Urine:1585; Chest Tube:30] Intake/Output this shift: Total I/O In: 1012 [P.O.:840; I.V.:120; IV Piggyback:52] Out: 1145 [Urine:1145] Current Meds: Scheduled Meds:   . acetaminophen  1,000 mg Oral Q6H   Or  . acetaminophen (TYLENOL) oral liquid 160 mg/5 mL  975 mg Per Tube Q6H  . aspirin  81 mg Oral Daily  . bisacodyl  10 mg Oral Daily   Or  . bisacodyl  10 mg Rectal Daily  . cefUROXime (ZINACEF)  IV  1.5 g Intravenous Q12H  . docusate sodium  200 mg Oral Daily  . furosemide  20 mg Intravenous Q8H  . furosemide  40 mg Intravenous Q8H  . insulin aspart  0-24 Units Subcutaneous Q2H   Followed by  . insulin aspart  0-24 Units Subcutaneous Q4H  . insulin glargine  25 Units Subcutaneous BID  . levalbuterol  1.25 mg Nebulization Q6H  . metoprolol tartrate  12.5 mg Oral BID   Or  . metoprolol tartrate  12.5 mg Per Tube BID  . morphine      . pantoprazole  40 mg Oral Q1200  . rosuvastatin  20 mg Oral q1800  . sodium chloride  3 mL Intravenous Q12H  . venlafaxine  150 mg Oral Daily  . DISCONTD: insulin aspart  0-24 Units Subcutaneous Q4H  . DISCONTD: insulin glargine  25 Units Subcutaneous QAM   Continuous Infusions:   . sodium chloride 20 mL/hr at 03/08/11 0700  . DOPamine 1 mcg/kg/min (03/08/11 0700)  . insulin (NOVOLIN-R) infusion    . insulin (NOVOLIN-R) infusion Stopped (03/08/11 0615)  . DISCONTD: sodium chloride    . DISCONTD: sodium chloride    . DISCONTD: dexmedetomidine (PRECEDEX) IV infusion 0.1 mcg/kg/hr (03/06/11 1845)  .  DISCONTD: lactated ringers 60 mL/hr at 03/06/11 1802  . DISCONTD: nitroGLYCERIN    . DISCONTD: norepinephrine (LEVOPHED) Adult infusion 5 mcg/min (03/07/11 0700)  . DISCONTD: phenylephrine (NEO-SYNEPHRINE) Adult infusion 25 mcg/min (03/06/11 1645)   PRN Meds:.fentaNYL, haloperidol lactate, insulin (NOVOLIN-R) infusion, morphine injection, ondansetron (ZOFRAN) IV, oxyCODONE, sodium chloride, triamcinolone cream, DISCONTD: metoprolol, DISCONTD: midazolam  General appearance: alert and cooperative Neurologic: intact Heart: regular rate and rhythm, S1, S2 normal, no murmur, click, rub or gallop and normal apical impulse Lungs: wheezes bibasilar improved from this morning  Lab Results: CBC: Basename 03/08/11 0357 03/07/11 1722 03/07/11 1700  WBC 12.6* -- 12.1*  HGB 9.7* 10.5* --  HCT 30.0* 31.0* --  PLT 106* -- 120*   BMET:  Basename 03/08/11 0357 03/07/11 1722 03/07/11 0416  NA 133* 137 --  K 3.9 4.0 --  CL 101 102 --  CO2 26 -- 24  GLUCOSE 92 160* --  BUN 21 20 --  CREATININE 1.16 1.30 --  CALCIUM 7.9* -- 7.9*    PT/INR:  Basename 03/06/11 1530  LABPROT 19.7*  INR 1.64*   Radiology: Dg Chest Port 1 View  03/08/2011  *RADIOLOGY REPORT*  Clinical Data: Post open heart surgery  PORTABLE CHEST - 1 VIEW  Comparison: Portable exam 0600 hours compared to 03/07/2011  Findings: Interval removal of mediastinal drains and Swan-Ganz catheter. Right jugular catheter, tip projecting over the origin of  the SVC. Enlargement of cardiac silhouette post CABG. Left subclavian transvenous pacemaker / AICD leads unchanged. Decrease pneumopericardium. Persistent atelectasis versus consolidation of left lower lobe. Scattered atelectasis mid-to-lower right lung. No definite pneumothorax.  IMPRESSION: Enlargement of cardiac silhouette post AICD and CABG. Decreased pneumopericardium. Persistent left lower lobe atelectasis versus consolidation with minimal atelectasis right lung.  Original Report  Authenticated By: Lollie Marrow, M.D.   Dg Chest Portable 1 View In Am  03/07/2011  *RADIOLOGY REPORT*  Clinical Data: Postop  PORTABLE CHEST - 1 VIEW  Comparison: 03/06/2011  Findings: Interval extubation.  Swan-Ganz catheter tip is in the proximal right pulmonary artery.  Pneumopericardium is again identified, unchanged from previous exam.  There are small pleural effusions.  Interstitial edema pattern appears improved from previous exam.  IMPRESSION:  1.  Interval extubation. 2.  No change in pneumopericardium. 3.  Improvement in edema pattern.  Original Report Authenticated By: Rosealee Albee, M.D.     Assessment/Plan: S/P Procedure(s) (LRB): REDO CORONARY ARTERY BYPASS GRAFTING (CABG) (N/A) Mobilize Diuresis Diabetes control   LOS: 2 days    Delight Ovens MD 03/08/2011 6:07 PM

## 2011-03-08 NOTE — Progress Notes (Signed)
Inpatient Diabetes Program Recommendations  AACE/ADA: New Consensus Statement on Inpatient Glycemic Control (2009)  Target Ranges:  Prepandial:   less than 140 mg/dL      Peak postprandial:   less than 180 mg/dL (1-2 hours)      Critically ill patients:  140 - 180 mg/dL   Reason for Visit: Z6X=0.9%.  Was on insulin prior to admit.  CBG's look good today.  Inpatient Diabetes Program Recommendations HgbA1C: A1c=8.9%.  Will need follow up with primary MD for glycemic control and also would benefit from diabetes education during cardiac rehab program.  Note:

## 2011-03-09 ENCOUNTER — Inpatient Hospital Stay (HOSPITAL_COMMUNITY): Payer: Managed Care, Other (non HMO)

## 2011-03-09 LAB — GLUCOSE, CAPILLARY
Glucose-Capillary: 110 mg/dL — ABNORMAL HIGH (ref 70–99)
Glucose-Capillary: 144 mg/dL — ABNORMAL HIGH (ref 70–99)
Glucose-Capillary: 127 mg/dL — ABNORMAL HIGH (ref 70–99)
Glucose-Capillary: 133 mg/dL — ABNORMAL HIGH (ref 70–99)

## 2011-03-09 LAB — BASIC METABOLIC PANEL
Chloride: 100 mEq/L (ref 96–112)
GFR calc Af Amer: 69 mL/min — ABNORMAL LOW (ref >90)
GFR calc non Af Amer: 59 mL/min — ABNORMAL LOW (ref >90)
Glucose, Bld: 124 mg/dL — ABNORMAL HIGH (ref 70–99)
Potassium: 4.1 mEq/L (ref 3.5–5.1)
Sodium: 134 mEq/L — ABNORMAL LOW (ref 135–145)

## 2011-03-09 LAB — CBC
Hemoglobin: 9.3 g/dL — ABNORMAL LOW (ref 13.0–17.0)
MCH: 29.3 pg (ref 26.0–34.0)
RBC: 3.17 MIL/uL — ABNORMAL LOW (ref 4.22–5.81)

## 2011-03-09 MED ORDER — FUROSEMIDE 10 MG/ML IJ SOLN
40.0000 mg | Freq: Two times a day (BID) | INTRAMUSCULAR | Status: DC
  Administered 2011-03-09 – 2011-03-10 (×3): 40 mg via INTRAVENOUS
  Filled 2011-03-09 (×2): qty 4

## 2011-03-09 NOTE — Progress Notes (Signed)
Patient ID: [name], male   DOB: 1941/03/16, 70 y.o.   MRN: [id number]  Hemodynamically stable.  No new problems today.

## 2011-03-09 NOTE — Progress Notes (Signed)
Patient ID: [name], male   DOB: May 26, 1940, 70 y.o.   MRN: [id number] 3 Days Post-Op Procedure(s) (LRB): REDO CORONARY ARTERY BYPASS GRAFTING (CABG) (N/A) Subjective: The patient continues to improve. He had some small amount wheezing this morning improved with bronchodilators. Otherwise he denies any significant shortness of breath.  Objective: Vital signs in last 24 hours: Patient Vitals for the past 24 hrs:  BP Temp Temp src Pulse Resp SpO2 Weight  03/09/11 0923 - - - - - 94 % -  03/09/11 0900 124/84 mmHg - - 82  18  96 % -  03/09/11 0800 116/72 mmHg - - 86  14  94 % -  03/09/11 0722 - 98.4 F (36.9 C) Oral - - - -  03/09/11 0700 123/62 mmHg - - 80  11  93 % -  03/09/11 0600 142/69 mmHg - - 95  23  96 % 260 lb 12.9 oz (118.3 kg)  03/09/11 0500 113/62 mmHg - - 79  12  92 % -  03/09/11 0400 117/63 mmHg - - 81  13  91 % -  03/09/11 0300 118/67 mmHg 97.7 F (36.5 C) Oral 77  15  94 % -  03/09/11 0200 120/64 mmHg - - 81  12  95 % -  03/09/11 0153 - - - - - 95 % -  03/09/11 0100 106/64 mmHg - - 77  10  94 % -  03/09/11 0000 114/65 mmHg 97.5 F (36.4 C) Oral 83  13  93 % -  03/08/11 2300 107/54 mmHg - - 81  13  93 % -  03/08/11 2203 123/67 mmHg - - 90  - - -  03/08/11 2200 123/67 mmHg - - 80  15  97 % -  03/08/11 2100 113/67 mmHg - - 79  9  97 % -  03/08/11 2000 139/76 mmHg - - 89  18  97 % -  03/08/11 1956 - - - - - 95 % -  03/08/11 1911 - 98 F (36.7 C) Oral - - - -  03/08/11 1900 121/65 mmHg - - 82  13  96 % -  03/08/11 1800 126/64 mmHg - - 91  18  96 % -  03/08/11 1700 154/81 mmHg - - 86  20  90 % -  03/08/11 1600 124/68 mmHg - - 83  9  93 % -  03/08/11 1514 - 97.7 F (36.5 C) Oral - - - -  03/08/11 1500 143/80 mmHg - - 96  24  94 % -  03/08/11 1408 - - - 89  18  95 % -  03/08/11 1400 - - - 84  7  94 % -  03/08/11 1300 133/76 mmHg - - 107  28  94 % -  03/08/11 1200 - - - 81  14  95 % -  03/08/11 1108 - 98 F (36.7 C) Oral - - - -  03/08/11 1100 131/61 mmHg - - 83   12  94 % -    Hemodynamic parameters for last 24 hours:    Intake/Output from previous day: 11/21 0701 - 11/22 0700 In: 1532 [P.O.:1310; I.V.:170; IV Piggyback:52] Out: 2305 [Urine:2305] Intake/Output this shift: Total I/O In: 10 [I.V.:10] Out: 550 [Urine:550]  Scheduled Meds:    . acetaminophen  1,000 mg Oral Q6H   Or  . acetaminophen (TYLENOL) oral liquid 160 mg/5 mL  975 mg Per Tube Q6H  . aspirin  81 mg Oral Daily  .  bisacodyl  10 mg Oral Daily   Or  . bisacodyl  10 mg Rectal Daily  . docusate sodium  200 mg Oral Daily  . furosemide  40 mg Intravenous Q12H  . insulin aspart  0-24 Units Subcutaneous Q2H   Followed by  . insulin aspart  0-24 Units Subcutaneous Q4H  . insulin glargine  25 Units Subcutaneous BID  . levalbuterol  1.25 mg Nebulization Q6H  . metoprolol tartrate  12.5 mg Oral BID   Or  . metoprolol tartrate  12.5 mg Per Tube BID  . pantoprazole  40 mg Oral Q1200  . rosuvastatin  20 mg Oral q1800  . sodium chloride  3 mL Intravenous Q12H  . venlafaxine  150 mg Oral Daily  . DISCONTD: furosemide  40 mg Intravenous Q8H  . DISCONTD: insulin aspart  0-24 Units Subcutaneous Q4H   Continuous Infusions:    . sodium chloride 20 mL/hr at 03/08/11 0700  . DISCONTD: DOPamine 1 mcg/kg/min (03/08/11 0700)  . DISCONTD: insulin (NOVOLIN-R) infusion    . DISCONTD: insulin (NOVOLIN-R) infusion Stopped (03/08/11 0615)   PRN Meds:.ondansetron (ZOFRAN) IV, oxyCODONE, sodium chloride, triamcinolone cream, DISCONTD: fentaNYL, DISCONTD: haloperidol lactate, DISCONTD: insulin (NOVOLIN-R) infusion  General appearance: alert and cooperative Neurologic: intact Heart: regular rate and rhythm, S1, S2 normal, no murmur, click, rub or gallop, normal apical impulse and no rub Lungs: wheezes bilaterally Abdomen: soft, non-tender; bowel sounds normal; no masses,  no organomegaly Extremities: extremities normal, atraumatic, no cyanosis or edema, Homans sign is negative, no sign of  DVT, no edema, redness or tenderness in the calves or thighs and no ulcers, gangrene or trophic changes Wound: Patient's wound is intact and stable without drainage  Lab Results: CBC:  Basename 03/09/11 0450 03/08/11 0357  WBC 12.8* 12.6*  HGB 9.3* 9.7*  HCT 29.1* 30.0*  PLT 130* 106*   BMET:   Basename 03/09/11 0450 03/08/11 0357  NA 134* 133*  K 4.1 3.9  CL 100 101  CO2 27 26  GLUCOSE 124* 92  BUN 31* 21  CREATININE 1.22 1.16  CALCIUM 8.2* 7.9*    PT/INR:   Basename 03/06/11 1530  LABPROT 19.7*  INR 1.64*    Radiology Dg Chest Port 1 View  03/09/2011  *RADIOLOGY REPORT*  Clinical Data: Postop from open heart surgery.  Coronary artery disease.  PORTABLE CHEST - 1 VIEW  Comparison: 03/08/2011  Findings: Cardiac silhouette remains stable in size.  Decreased pneumopericardium seen since previous study.  There is persistent left lower lobe atelectasis.  Minimal pleural fluid seen in the right minor fissure.  No pneumothorax identified.  AICD and right jugular Cordis remain in place.  Previous CABG noted.  IMPRESSION:  1.  Decreased pneumopericardium.  No evidence of pneumothorax. 2.  Persistent left lower lobe atelectasis.  Stable cardiomegaly.  Original Report Authenticated By: Danae Orleans, M.D.   Dg Chest Port 1 View  03/08/2011  *RADIOLOGY REPORT*  Clinical Data: Post open heart surgery  PORTABLE CHEST - 1 VIEW  Comparison: Portable exam 0600 hours compared to 03/07/2011  Findings: Interval removal of mediastinal drains and Swan-Ganz catheter. Right jugular catheter, tip projecting over the origin of the SVC. Enlargement of cardiac silhouette post CABG. Left subclavian transvenous pacemaker / AICD leads unchanged. Decrease pneumopericardium. Persistent atelectasis versus consolidation of left lower lobe. Scattered atelectasis mid-to-lower right lung. No definite pneumothorax.  IMPRESSION: Enlargement of cardiac silhouette post AICD and CABG. Decreased pneumopericardium.  Persistent left lower lobe atelectasis versus consolidation with minimal  atelectasis right lung.  Original Report Authenticated By: Lollie Marrow, M.D.     Assessment/Plan: S/P Procedure(s) (LRB): REDO CORONARY ARTERY BYPASS GRAFTING (CABG) (N/A) Mobilize Diuresis Diabetes control Plan for transfer to step-down: see transfer orders plan to transfer to 2000 in am. Continue to diurese but decrease the dose of Lasix to 40 twice a day. Patient is stable and improving. DC Foley in right IJ line today   LOS: 3 days    Love Chowning B 03/09/2011

## 2011-03-10 ENCOUNTER — Inpatient Hospital Stay (HOSPITAL_COMMUNITY): Payer: Managed Care, Other (non HMO)

## 2011-03-10 LAB — GLUCOSE, CAPILLARY
Glucose-Capillary: 100 mg/dL — ABNORMAL HIGH (ref 70–99)
Glucose-Capillary: 142 mg/dL — ABNORMAL HIGH (ref 70–99)
Glucose-Capillary: 154 mg/dL — ABNORMAL HIGH (ref 70–99)
Glucose-Capillary: 157 mg/dL — ABNORMAL HIGH (ref 70–99)
Glucose-Capillary: 183 mg/dL — ABNORMAL HIGH (ref 70–99)
Glucose-Capillary: 83 mg/dL (ref 70–99)

## 2011-03-10 LAB — BASIC METABOLIC PANEL
BUN: 25 mg/dL — ABNORMAL HIGH (ref 6–23)
CO2: 28 mEq/L (ref 19–32)
Calcium: 8.2 mg/dL — ABNORMAL LOW (ref 8.4–10.5)
Chloride: 97 mEq/L (ref 96–112)
Creatinine, Ser: 0.94 mg/dL (ref 0.50–1.35)
GFR calc Af Amer: >90 mL/min (ref >90)
GFR calc non Af Amer: 84 mL/min — ABNORMAL LOW (ref >90)
Glucose, Bld: 80 mg/dL (ref 70–99)
Potassium: 3.4 mEq/L — ABNORMAL LOW (ref 3.5–5.1)
Sodium: 135 mEq/L (ref 135–145)

## 2011-03-10 LAB — CBC
HCT: 29.6 % — ABNORMAL LOW (ref 39.0–52.0)
Hemoglobin: 9.7 g/dL — ABNORMAL LOW (ref 13.0–17.0)
MCH: 29.8 pg (ref 26.0–34.0)
MCHC: 32.8 g/dL (ref 30.0–36.0)
MCV: 90.8 fL (ref 78.0–100.0)
Platelets: 189 10*3/uL (ref 150–400)
RBC: 3.26 MIL/uL — ABNORMAL LOW (ref 4.22–5.81)
RDW: 14.8 % (ref 11.5–15.5)
WBC: 10.4 10*3/uL (ref 4.0–10.5)

## 2011-03-10 MED ORDER — MAGNESIUM HYDROXIDE 400 MG/5ML PO SUSP
30.0000 mL | Freq: Every day | ORAL | Status: DC | PRN
  Administered 2011-03-10: 30 mL via ORAL
  Filled 2011-03-10: qty 30

## 2011-03-10 MED ORDER — ONDANSETRON HCL 4 MG/2ML IJ SOLN: 4.0000 mg | Freq: Four times a day (QID) | INTRAMUSCULAR | Status: DC | PRN

## 2011-03-10 MED ORDER — POTASSIUM CHLORIDE CRYS ER 20 MEQ PO TBCR
20.0000 meq | EXTENDED_RELEASE_TABLET | Freq: Two times a day (BID) | ORAL | Status: AC
  Administered 2011-03-10 – 2011-03-12 (×6): 20 meq via ORAL
  Filled 2011-03-10 (×6): qty 1

## 2011-03-10 MED ORDER — METOPROLOL TARTRATE 25 MG PO TABS
25.0000 mg | ORAL_TABLET | Freq: Two times a day (BID) | ORAL | Status: DC
  Administered 2011-03-10 – 2011-03-13 (×6): 25 mg via ORAL
  Filled 2011-03-10 (×7): qty 1

## 2011-03-10 MED ORDER — SODIUM CHLORIDE 0.9 % IJ SOLN
3.0000 mL | Freq: Two times a day (BID) | INTRAMUSCULAR | Status: DC
  Administered 2011-03-10 – 2011-03-13 (×6): 3 mL via INTRAVENOUS

## 2011-03-10 MED ORDER — OXYCODONE HCL 5 MG PO TABS
5.0000 mg | ORAL_TABLET | ORAL | Status: DC | PRN
  Administered 2011-03-10 – 2011-03-12 (×5): 10 mg via ORAL
  Filled 2011-03-10 (×6): qty 2

## 2011-03-10 MED ORDER — ACETAMINOPHEN 325 MG PO TABS: 650.0000 mg | ORAL_TABLET | Freq: Four times a day (QID) | ORAL | Status: DC | PRN

## 2011-03-10 MED ORDER — FUROSEMIDE 40 MG PO TABS
40.0000 mg | ORAL_TABLET | Freq: Two times a day (BID) | ORAL | Status: DC
  Administered 2011-03-10 – 2011-03-13 (×6): 40 mg via ORAL
  Filled 2011-03-10 (×8): qty 1

## 2011-03-10 MED ORDER — ONDANSETRON HCL 4 MG PO TABS
4.0000 mg | ORAL_TABLET | Freq: Four times a day (QID) | ORAL | Status: DC | PRN
  Administered 2011-03-11: 4 mg via ORAL
  Filled 2011-03-10: qty 1

## 2011-03-10 MED ORDER — PANTOPRAZOLE SODIUM 40 MG PO TBEC
40.0000 mg | DELAYED_RELEASE_TABLET | Freq: Every day | ORAL | Status: DC
  Administered 2011-03-11 – 2011-03-13 (×3): 40 mg via ORAL
  Filled 2011-03-10 (×3): qty 1

## 2011-03-10 MED ORDER — DIPHENHYDRAMINE HCL 25 MG PO CAPS: 25.0000 mg | ORAL_CAPSULE | Freq: Every evening | ORAL | Status: DC | PRN

## 2011-03-10 MED ORDER — SODIUM CHLORIDE 0.9 % IJ SOLN: 3.0000 mL | INTRAMUSCULAR | Status: DC | PRN

## 2011-03-10 MED ORDER — BISACODYL 5 MG PO TBEC
10.0000 mg | DELAYED_RELEASE_TABLET | Freq: Every day | ORAL | Status: DC | PRN
  Administered 2011-03-11: 10 mg via ORAL
  Filled 2011-03-10: qty 2

## 2011-03-10 MED ORDER — MOVING RIGHT ALONG BOOK
Freq: Once | Status: AC
  Administered 2011-03-10: 19:00:00
  Filled 2011-03-10: qty 1

## 2011-03-10 MED ORDER — POVIDONE-IODINE 10 % EX SOLN
1.0000 "application " | Freq: Two times a day (BID) | CUTANEOUS | Status: DC
  Administered 2011-03-10 – 2011-03-13 (×6): 1 via TOPICAL
  Filled 2011-03-10: qty 15

## 2011-03-10 MED ORDER — BISACODYL 10 MG RE SUPP: 10.0000 mg | Freq: Every day | RECTAL | Status: DC | PRN

## 2011-03-10 MED ORDER — ALUM & MAG HYDROXIDE-SIMETH 400-400-40 MG/5ML PO SUSP
15.0000 mL | ORAL | Status: DC | PRN
  Filled 2011-03-10: qty 30

## 2011-03-10 MED ORDER — TRAMADOL HCL 50 MG PO TABS: 50.0000 mg | ORAL_TABLET | ORAL | Status: DC | PRN

## 2011-03-10 MED ORDER — DOCUSATE SODIUM 100 MG PO CAPS: 200.0000 mg | ORAL_CAPSULE | Freq: Every day | ORAL | Status: DC

## 2011-03-10 NOTE — Progress Notes (Signed)
4 Days Post-Op Procedure(s) (LRB): REDO CORONARY ARTERY BYPASS GRAFTING (CABG) (N/A)                       [address]            [place] [id number]          [phone]        Subjective:wants to go home No complaint of pain CXR clear NSR off O2  Objective: Vital signs in last 24 hours: Temp:  [97.4 F (36.3 C)-98.6 F (37 C)] 98.1 F (36.7 C) (11/23 0708) Pulse Rate:  [76-109] 87  (11/23 0700) Cardiac Rhythm:  [-] Normal sinus rhythm (11/23 0600) Resp:  [10-25] 21  (11/23 0700) BP: (93-161)/(49-99) 147/81 mmHg (11/23 0600) SpO2:  [89 %-99 %] 98 % (11/23 0700) Weight:  [257 lb 0.9 oz (116.6 kg)] 257 lb 0.9 oz (116.6 kg) (11/23 0600)  Hemodynamic parameters for last 24 hours:  stable  Intake/Output from previous day: 11/22 0701 - 11/23 0700 In: 974 [P.O.:660; I.V.:310; IV Piggyback:4] Out: 2625 [Urine:2625] Intake/Output this shift:    EXAM  Incisions clean,lungs clear, min edema  Lab Results:  Basename 03/10/11 0400 03/09/11 0450  WBC 10.4 12.8*  HGB 9.7* 9.3*  HCT 29.6* 29.1*  PLT 189 130*   BMET:  Basename 03/10/11 0400 03/09/11 0450  NA 135 134*  K 3.4* 4.1  CL 97 100  CO2 28 27  GLUCOSE 80 124*  BUN 25* 31*  CREATININE 0.94 1.22  CALCIUM 8.2* 8.2*    PT/INR: No results found for this basename: LABPROT,INR in the last 72 hours ABG    Component Value Date/Time   PHART 7.359 03/06/2011 2359   HCO3 25.4* 03/06/2011 2359   TCO2 23 03/07/2011 1722   ACIDBASEDEF 3.0* 03/06/2011 2154   O2SAT 89.0 03/06/2011 2359   CBG (last 3)   Basename 03/10/11 0738 03/10/11 0416 03/09/11 2354  GLUCAP 142* 83 100*    Assessment/Plan: S/P Procedure(s) (LRB): REDO CORONARY ARTERY BYPASS GRAFTING (CABG) (N/A) Diuresis Diabetes control Plan for transfer to step-down: see transfer orders Supp K  LOS: 4 days    VAN TRIGT III,Toyoko Silos 03/10/2011

## 2011-03-10 NOTE — Progress Notes (Signed)
Patient is belligerent and hostile, insisting that it is daytime and that someone is mowing the lawn.  When told what time it was, he stated, You are full of "curse word used" and you can all rot in hell".  Patient refuses assistance, states, it's "My civil liberties that you can't touch me-I'll wait until my wife is here".  Emotional support offered to patient.  Side rails up-patient is not attempting to climb OOB.  Door open, lights in room on, call bell in reach.

## 2011-03-10 NOTE — Progress Notes (Signed)
   CARE MANAGEMENT NOTE 03/10/2011  Patient:  [name], [name]   Account Number:  [id number]  Date Initiated:  03/07/2011  Documentation initiated by:  [facility]  Subjective/Objective Assessment:   Post op on 03-06-11 with CABG x2.     Action/Plan:   PTA, PT INDEPENDENT, LIVES WITH SPOUSE.   Anticipated DC Date:  03/11/2011   Anticipated DC Plan:  HOME W HOME HEALTH SERVICES      DC Planning Services  CM consult      Choice offered to / List presented to:             Status of service:  In process, will continue to follow Medicare Important Message given?   (If response is "NO", the following Medicare IM given date fields will be blank) Date Medicare IM given:   Date Additional Medicare IM given:    Discharge Disposition:    Per UR Regulation:  Reviewed for med. necessity/level of care/duration of stay  Comments:  03/10/11 Nickolaus Bordelon,RN,BSN 1430 MET WITH PT TO DISCUSS DISCHARGE PLANS.  WIFE TO PROVIDE 24HR CARE AT DISCHARGE.  WILL FOLLOW FOR HOME NEEDS AS PT PROGRESSES. Phone #[phone]  03-10-11 Mena Goes - [phone] UR completed.  03-07-11 12:10pm Johny Shears - [phone] UR Completed.

## 2011-03-10 NOTE — Progress Notes (Signed)
Patient ID: [name], male   DOB: 24-Apr-1940, 70 y.o.   MRN: [id number] 4 Days Post-Op Procedure(s) (LRB): REDO CORONARY ARTERY BYPASS GRAFTING (CABG) (N/A) Subjective: Some confusion last night but better now  Objective: Vital signs in last 24 hours: Patient Vitals for the past 24 hrs:  BP Temp Temp src Pulse Resp SpO2 Weight  03/10/11 1221 - 97.3 F (36.3 C) Oral - - - -  03/10/11 1100 - - - 97  18  98 % -  03/10/11 1000 127/64 mmHg - - 93  23  93 % -  03/10/11 0900 129/73 mmHg - - 72  21  99 % -  03/10/11 0836 - - - - - 98 % -  03/10/11 0800 121/90 mmHg - - 97  26  92 % -  03/10/11 0708 - 98.1 F (36.7 C) Oral - - - -  03/10/11 0700 - - - 87  21  98 % -  03/10/11 0600 147/81 mmHg - - 83  21  98 % 257 lb 0.9 oz (116.6 kg)  03/10/11 0500 131/65 mmHg - - 77  14  98 % -  03/10/11 0418 - 97.4 F (36.3 C) Oral - - - -  03/10/11 0400 152/67 mmHg - - 86  21  97 % -  03/10/11 0300 124/57 mmHg - - 82  17  96 % -  03/10/11 0200 127/70 mmHg - - 87  18  96 % -  03/10/11 0100 121/58 mmHg - - 82  17  97 % -  03/10/11 0000 104/77 mmHg - - 84  20  97 % -  03/09/11 2356 - 97.7 F (36.5 C) Oral - - - -  03/09/11 2300 93/49 mmHg - - 79  11  96 % -  03/09/11 2200 112/99 mmHg - - 109  25  89 % -  03/09/11 2100 161/89 mmHg - - 99  23  94 % -  03/09/11 2000 131/58 mmHg - - 79  13  96 % -  03/09/11 1955 - - - - - 96 % -  03/09/11 1903 - 97.8 F (36.6 C) Oral - - - -  03/09/11 1900 129/62 mmHg - - 86  20  98 % -  03/09/11 1800 138/98 mmHg - - 93  21  97 % -  03/09/11 1700 139/67 mmHg - - 88  18  95 % -  03/09/11 1634 - 98.6 F (37 C) Oral - - - -  03/09/11 1600 113/60 mmHg - - 77  11  98 % -  03/09/11 1500 115/60 mmHg - - 84  21  96 % -  03/09/11 1425 - - - - - 99 % -  03/09/11 1400 120/60 mmHg - - 82  18  97 % -  03/09/11 1300 119/72 mmHg - - 76  10  97 % -    Hemodynamic parameters for last 24 hours:    Intake/Output from previous day: 11/22 0701 - 11/23 0700 In: 974 [P.O.:660;  I.V.:310; IV Piggyback:4] Out: 2625 [Urine:2625] Intake/Output this shift: Total I/O In: 240 [P.O.:240] Out: 300 [Urine:300]  Scheduled Meds:   . aspirin  81 mg Oral Daily  . bisacodyl  10 mg Oral Daily   Or  . bisacodyl  10 mg Rectal Daily  . docusate sodium  200 mg Oral Daily  . furosemide  40 mg Intravenous Q12H  . insulin aspart  0-24 Units Subcutaneous Q4H  . insulin glargine  25 Units Subcutaneous BID  . levalbuterol  1.25 mg Nebulization Q6H  . metoprolol tartrate  12.5 mg Oral BID   Or  . metoprolol tartrate  12.5 mg Per Tube BID  . pantoprazole  40 mg Oral Q1200  . potassium chloride  20 mEq Oral BID  . rosuvastatin  20 mg Oral q1800  . sodium chloride  3 mL Intravenous Q12H  . venlafaxine  150 mg Oral Daily   Continuous Infusions:   . DISCONTD: sodium chloride 20 mL/hr at 03/08/11 0700   PRN Meds:.ondansetron (ZOFRAN) IV, oxyCODONE, sodium chloride, traMADol, triamcinolone cream  General appearance: alert and cooperative Neurologic: intact Heart: regular rate and rhythm, S1, S2 normal, no murmur, click, rub or gallop and normal apical impulse Lungs: wheezes bibasilar Abdomen: soft, non-tender; bowel sounds normal; no masses,  no organomegaly Wound: stable no drainage  Lab Results: CBC: Basename 03/10/11 0400 03/09/11 0450  WBC 10.4 12.8*  HGB 9.7* 9.3*  HCT 29.6* 29.1*  PLT 189 130*   BMET:  Basename 03/10/11 0400 03/09/11 0450  NA 135 134*  K 3.4* 4.1  CL 97 100  CO2 28 27  GLUCOSE 80 124*  BUN 25* 31*  CREATININE 0.94 1.22  CALCIUM 8.2* 8.2*     Radiology Dg Chest 2 View  03/10/2011  *RADIOLOGY REPORT*  Clinical Data: Postop from cardiac surgery.  Coronary artery disease.  Shortness of breath.  CHEST - 2 VIEW  Comparison: 03/09/2011  Findings: Left lower lobe atelectasis shows no significant change. Tiny pleural effusions are also stable.  Cardiomegaly and pulmonary vascular congestion also appears stable.  New AICD leads are seen in the  right atrium right ventricle.  No evidence of pneumothorax.  IMPRESSION:  1.  No significant change in the left lower lobe atelectasis and a small pleural effusions. 2.  New AICD leads.  No evidence of pneumothorax.  Original Report Authenticated By: Danae Orleans, M.D.   Dg Chest Port 1 View  03/09/2011  *RADIOLOGY REPORT*  Clinical Data: Postop from open heart surgery.  Coronary artery disease.  PORTABLE CHEST - 1 VIEW  Comparison: 03/08/2011  Findings: Cardiac silhouette remains stable in size.  Decreased pneumopericardium seen since previous study.  There is persistent left lower lobe atelectasis.  Minimal pleural fluid seen in the right minor fissure.  No pneumothorax identified.  AICD and right jugular Cordis remain in place.  Previous CABG noted.  IMPRESSION:  1.  Decreased pneumopericardium.  No evidence of pneumothorax. 2.  Persistent left lower lobe atelectasis.  Stable cardiomegaly.  Original Report Authenticated By: Danae Orleans, M.D.     Assessment/Plan: S/P Procedure(s) (LRB): REDO CORONARY ARTERY BYPASS GRAFTING (CABG) (N/A) Mobilize Diuresis Diabetes control Plan for transfer to step-down: see transfer orders See progression orders   LOS: 4 days     Delight Ovens MD 03/10/2011 12:52 PM

## 2011-03-10 NOTE — Progress Notes (Signed)
UR Completed.  [name] Jane [phone] 03/10/2011  

## 2011-03-11 LAB — GLUCOSE, CAPILLARY
Glucose-Capillary: 121 mg/dL — ABNORMAL HIGH (ref 70–99)
Glucose-Capillary: 145 mg/dL — ABNORMAL HIGH (ref 70–99)
Glucose-Capillary: 146 mg/dL — ABNORMAL HIGH (ref 70–99)
Glucose-Capillary: 149 mg/dL — ABNORMAL HIGH (ref 70–99)
Glucose-Capillary: 173 mg/dL — ABNORMAL HIGH (ref 70–99)
Glucose-Capillary: 200 mg/dL — ABNORMAL HIGH (ref 70–99)

## 2011-03-11 LAB — BASIC METABOLIC PANEL
BUN: 22 mg/dL (ref 6–23)
CO2: 29 mEq/L (ref 19–32)
Calcium: 8.4 mg/dL (ref 8.4–10.5)
Chloride: 100 mEq/L (ref 96–112)
Creatinine, Ser: 0.96 mg/dL (ref 0.50–1.35)
GFR calc Af Amer: >90 mL/min (ref >90)
GFR calc non Af Amer: 83 mL/min — ABNORMAL LOW (ref >90)
Glucose, Bld: 129 mg/dL — ABNORMAL HIGH (ref 70–99)
Potassium: 3.9 mEq/L (ref 3.5–5.1)
Sodium: 137 mEq/L (ref 135–145)

## 2011-03-11 LAB — CBC
HCT: 27.9 % — ABNORMAL LOW (ref 39.0–52.0)
Hemoglobin: 9 g/dL — ABNORMAL LOW (ref 13.0–17.0)
MCH: 29.4 pg (ref 26.0–34.0)
MCHC: 32.3 g/dL (ref 30.0–36.0)
MCV: 91.2 fL (ref 78.0–100.0)
Platelets: 220 10*3/uL (ref 150–400)
RBC: 3.06 MIL/uL — ABNORMAL LOW (ref 4.22–5.81)
RDW: 15.2 % (ref 11.5–15.5)
WBC: 8.5 10*3/uL (ref 4.0–10.5)

## 2011-03-11 NOTE — Progress Notes (Signed)
CARDIAC REHAB PHASE I   PRE:  Rate/Rhythm: 75 SR  BP:  Supine: 122/71  Sitting:   Standing:    SaO2: 95 2L  MODE:  Ambulation: 250 ft   POST:  Rate/Rhythem: 89 SR  BP:  Supine:   Sitting: 107/68  Standing:    SaO2: 99 3L  [name]  Pt ambulated 250 ft assist x 2. Tolerated well, no complaints. Pt did not use rolling walker, gait stable. Returned to [employer]. Vital signs stable. Call bell in reach. Will follow up. Harriett Sine MS 03/11/11 1142

## 2011-03-11 NOTE — Progress Notes (Addendum)
5 Days Post-Op  Procedure(s) (LRB): REDO CORONARY ARTERY BYPASS GRAFTING (CABG) (N/A) Subjective: Slowly feeling stronger  Objective  Telemetry NSR  Temp:  [97 F (36.1 C)-98.3 F (36.8 C)] 98.3 F (36.8 C) (11/24 0414) Pulse Rate:  [80-97] 80  (11/24 0414) Resp:  [18-29] 18  (11/24 0414) BP: (120-143)/(62-80) 121/78 mmHg (11/24 0414) SpO2:  [90 %-99 %] 96 % (11/24 0924) Weight:  [253 lb 1.4 oz (114.8 kg)-257 lb 0.9 oz (116.6 kg)] 253 lb 1.4 oz (114.8 kg) (11/24 0414)   Intake/Output Summary (Last 24 hours) at 03/11/11 1016 Last data filed at 03/11/11 0400  Gross per 24 hour  Intake    730 ml  Output   1200 ml  Net   -470 ml       General appearance: alert, cooperative and no distress Heart: regular rate and rhythm Lungs: clear to auscultation bilaterally Abdomen: soft, non-tender; bowel sounds normal; no masses,  no organomegaly Extremities: 2+ edema Wound: incisions healing well without signs of infection  Lab Results:  Basename 03/11/11 0600 03/10/11 0400  NA 137 135  K 3.9 3.4*  CL 100 97  CO2 29 28  GLUCOSE 129* 80  BUN 22 25*  CREATININE 0.96 0.94  CALCIUM 8.4 8.2*  MG -- --  PHOS -- --   No results found for this basename: AST:2,ALT:2,ALKPHOS:2,BILITOT:2,PROT:2,ALBUMIN:2 in the last 72 hours No results found for this basename: LIPASE:2,AMYLASE:2 in the last 72 hours  Basename 03/11/11 0600 03/10/11 0400  WBC 8.5 10.4  NEUTROABS -- --  HGB 9.0* 9.7*  HCT 27.9* 29.6*  MCV 91.2 90.8  PLT 220 189   No results found for this basename: CKTOTAL:4,CKMB:4,TROPONINI:4 in the last 72 hours No results found for this basename: POCBNP:3 in the last 72 hours No results found for this basename: DDIMER in the last 72 hours No results found for this basename: HGBA1C in the last 72 hours No results found for this basename: CHOL,HDL,LDLCALC,TRIG,CHOLHDL in the last 72 hours No results found for this basename: TSH,T4TOTAL,FREET3,T3FREE,THYROIDAB in the last 72  hours No results found for this basename: VITAMINB12,FOLATE,FERRITIN,TIBC,IRON,RETICCTPCT in the last 72 hours  Medications: Scheduled    . aspirin  81 mg Oral Daily  . docusate sodium  200 mg Oral Daily  . furosemide  40 mg Oral BID  . insulin aspart  0-24 Units Subcutaneous Q4H  . insulin glargine  25 Units Subcutaneous BID  . levalbuterol  1.25 mg Nebulization Q6H  . metoprolol tartrate  25 mg Oral BID  . moving right along book   Does not apply Once  . pantoprazole  40 mg Oral QAC breakfast  . potassium chloride  20 mEq Oral BID  . povidone-iodine  1 application Topical BID  . rosuvastatin  20 mg Oral q1800  . sodium chloride  3 mL Intravenous Q12H  . venlafaxine  150 mg Oral Daily  . DISCONTD: bisacodyl  10 mg Oral Daily  . DISCONTD: bisacodyl  10 mg Rectal Daily  . DISCONTD: docusate sodium  200 mg Oral Daily  . DISCONTD: furosemide  40 mg Intravenous Q12H  . DISCONTD: metoprolol tartrate  12.5 mg Per Tube BID  . DISCONTD: metoprolol tartrate  12.5 mg Oral BID  . DISCONTD: pantoprazole  40 mg Oral Q1200  . DISCONTD: sodium chloride  3 mL Intravenous Q12H     Radiology/Studies:  Dg Chest 2 View  03/10/2011  *RADIOLOGY REPORT*  Clinical Data: Postop from cardiac surgery.  Coronary artery disease.  Shortness of  breath.  CHEST - 2 VIEW  Comparison: 03/09/2011  Findings: Left lower lobe atelectasis shows no significant change. Tiny pleural effusions are also stable.  Cardiomegaly and pulmonary vascular congestion also appears stable.  New AICD leads are seen in the right atrium right ventricle.  No evidence of pneumothorax.  IMPRESSION:  1.  No significant change in the left lower lobe atelectasis and a small pleural effusions. 2.  New AICD leads.  No evidence of pneumothorax.  Original Report Authenticated By: Danae Orleans, M.D.    INR: Will add last result for INR, ABG once components are confirmed Will add last 4 CBG results once components are  confirmed  Assessment/Plan: S/P Procedure(s) (LRB): REDO CORONARY ARTERY BYPASS GRAFTING (CABG) (N/A) Mobilize Diuresis Diabetes control is good (121-157 range for CBG) pulm toilet ABL anemia-monitor    LOS: 5 days    GOLD,WAYNE E 11/24/201210:16 AM   I have located in the paper chart the documentation that the aicd was turned back on 03/06/11 at 6pm Continues to improve, poss dc Monday  Delight Ovens MD 03/11/2011 12:40 PM

## 2011-03-12 LAB — GLUCOSE, CAPILLARY
Glucose-Capillary: 128 mg/dL — ABNORMAL HIGH (ref 70–99)
Glucose-Capillary: 135 mg/dL — ABNORMAL HIGH (ref 70–99)
Glucose-Capillary: 145 mg/dL — ABNORMAL HIGH (ref 70–99)
Glucose-Capillary: 147 mg/dL — ABNORMAL HIGH (ref 70–99)
Glucose-Capillary: 163 mg/dL — ABNORMAL HIGH (ref 70–99)
Glucose-Capillary: 184 mg/dL — ABNORMAL HIGH (ref 70–99)
Glucose-Capillary: 86 mg/dL (ref 70–99)

## 2011-03-12 MED ORDER — TRAMADOL HCL 50 MG PO TABS: 50.0000 mg | ORAL_TABLET | Freq: Four times a day (QID) | ORAL | Status: AC | PRN

## 2011-03-12 NOTE — Discharge Summary (Signed)
[name] 08-07-1940 70 y.o. [id number]  03/06/2011   Delight Ovens, MD  CAD   HPI:  This is a 70 y.o. male who was referred to Dr. Tyrone Sage by Dr. Mayford Knife following cardiac catheterization done the week previously. He has a history of coronary disease having undergone coronary artery bypass grafting on 02/01/1996 at which time he had a coronary artery bypass x5. At that time he had a left internal mammary artery to the LAD, saphenous vein graft to the first and second diagonal, saphenous vein graft to the obtuse marginal, and saphenous vein graft to the right coronary artery. Since time he had an AICD placed on 2 different occasions. He's had an angioplasty of the right coronary vein. Serial imaging showed decline in LV function until now and he has a current ejection fraction of 27%. He notes a recent onset of some sternal chest discomfort and exertional shortness of breath. A repeat stress test was done by Dr. Mayford Knife. This lead to the cardiac catheterization. For full details please see the report. It was Dr. Tyrone Sage opinion he would best be served by surgical revascularization as a redo procedure and he was admitted this hospitalization.   Hospital Course:   The patient was taken to the operating room on 03/06/2011. At time he underwent redo coronary artery bypass grafting x2. This included a saphenous vein graft to the posterior descending coronary artery and a saphenous vein graft to the circumflex coronary artery. He tolerated the procedure well and was taken to the surgical intensive care unit in stable condition.  Postoperative hospital course:  Overall the patient has progressed nicely. He did have some initial difficulties related to postoperative delirium. This has shown a steady improvement with time. He additionally has had some difficulty with postoperative volume overload which is improved as well. Additionally he had difficulty with postoperative bronchospasm but this  also has improved with bronchodilators and aggressive pulmonary toilet. All routine lines monitors and drainage devices have been discontinued in the standard fashion. He does have a acute blood loss anemia and most recent hemoglobin dated 01/09/2011 is 9.0. He is tolerating gradually increasing activities using standard postoperative protocols. Incisions are healing well without evidence of infection. Oxygen has been weaned and currently he is not requiring supplementation. Overall the patient's status is felt be tentatively stable for discharge in the morning of 03/13/2011 pending morning round reevaluation. Dr. Tyrone Sage has evaluated his current medications and done his medication reconciliation.   Basename 03/11/11 0600 03/10/11 0400  NA 137 135  K 3.9 3.4*  CL 100 97  CO2 29 28  GLUCOSE 129* 80  BUN 22 25*  CALCIUM 8.4 8.2*    Basename 03/11/11 0600 03/10/11 0400  WBC 8.5 10.4  HGB 9.0* 9.7*  HCT 27.9* 29.6*  PLT 220 189   No results found for this basename: INR:2 in the last 72 hours   Discharge Instructions:  The patient is discharged to home with extensive instructions on wound care and progressive ambulation.  They are instructed not to drive or perform any heavy lifting until returning to see the physician in his office.  Discharge Diagnosis:  CAD  Secondary Diagnosis: Patient Active Problem List  Diagnoses  . Chronic systolic congestive heart failure  . Ischemic cardiomyopathy  . Hyperlipemia  . CAD (coronary artery disease)  . HTN (hypertension)  . DM (diabetes mellitus)   Past Medical History  Diagnosis Date  . Ischemic cardiomyopathy     s/p ICD Implantation by  Dr Amil Amen  . Chronic systolic dysfunction of left ventricle     EF 30%  . Diabetes mellitus   . CAD (coronary artery disease)     s/p CABG 1997, PCI (BMS) of SVG to RCA 3/09  . Hyperlipemia   . DJD (degenerative joint disease)   . Gout   . HTN (hypertension) 02/14/2011  . DM (diabetes mellitus)  02/14/2011  . Myocardial infarction   . Depression   . Complication of anesthesia     ONCE WITH BACK SURGERY DIFFICULT TO WAKE UP       [name], [name]  Home Medication Instructions [id number]   Printed on:03/12/11 1319  Medication Information                    allopurinol (ZYLOPRIM) 100 MG tablet Take 2 tablets by mouth once daily            carvedilol (COREG) 25 MG tablet Take 25 mg by mouth 2 (two) times daily.             furosemide (LASIX) 40 MG tablet Take 40 mg by mouth daily.            amLODipine-benazepril (LOTREL) 5-20 MG per capsule Take 1 capsule by mouth daily.             niacin (NIASPAN) 500 MG CR tablet Take 2 tablets by mouth two times a day            triamcinolone (KENALOG) 0.1 % cream Apply 1 application topically daily as needed. Apply sparingly to affected area externally two times a day as needed for itching and rash           venlafaxine (EFFEXOR-XR) 150 MG 24 hr capsule Take 150 mg by mouth daily.             aspirin 81 MG tablet Take 81 mg by mouth daily.            eplerenone (INSPRA) 25 MG tablet Take 25 mg by mouth daily.             atorvastatin (LIPITOR) 40 MG tablet Take 40 mg by mouth daily.             Liraglutide (VICTOZA [place]) Inject 1.6 Units into the skin as directed.            insulin glargine (LANTUS) 100 UNIT/ML injection Inject 65 Units into the skin at bedtime.            insulin glulisine (APIDRA) 100 UNIT/ML injection Inject 30 Units into the skin 3 (three) times daily before meals.             traMADol (ULTRAM) 50 MG tablet Take 1 tablet (50 mg total) by mouth every 6 (six) hours as needed. Maximum dose= 8 tablets per day             Disposition: Home  Patient's condition is Good  Gershon Crane, PA-C 03/12/2011  1:19 PM

## 2011-03-12 NOTE — Progress Notes (Addendum)
6 Days Post-Op  Procedure(s) (LRB): REDO CORONARY ARTERY BYPASS GRAFTING (CABG) (N/A) Subjective: Continues to feel stronger. Had BM yesterday. Ambulation improving.  Objective  Telemetry NSR , BBB, PVC's  Temp:  [95.8 F (35.4 C)-98.4 F (36.9 C)] 97.5 F (36.4 C) (11/25 0358) Pulse Rate:  [76-82] 81  (11/25 0358) Resp:  [18-20] 20  (11/25 0358) BP: (116-124)/(66-78) 124/75 mmHg (11/25 0358) SpO2:  [94 %-97 %] 94 % (11/25 0745) Weight:  [252 lb 13.9 oz (114.7 kg)] 252 lb 13.9 oz (114.7 kg) (11/25 0500)   Intake/Output Summary (Last 24 hours) at 03/12/11 0939 Last data filed at 03/11/11 1700  Gross per 24 hour  Intake    480 ml  Output    300 ml  Net    180 ml       General appearance: alert, cooperative and no distress Heart: RRR with occasional extrasystole Lungs: clear to auscultation bilaterally Abdomen: soft, mod distension, + BS, -TTP Extremities: 2+ bilat LE edema Wound: incisions healing well without signs of infection  Lab Results:  Basename 03/11/11 0600 03/10/11 0400  NA 137 135  K 3.9 3.4*  CL 100 97  CO2 29 28  GLUCOSE 129* 80  BUN 22 25*  CREATININE 0.96 0.94  CALCIUM 8.4 8.2*  MG -- --  PHOS -- --   No results found for this basename: AST:2,ALT:2,ALKPHOS:2,BILITOT:2,PROT:2,ALBUMIN:2 in the last 72 hours No results found for this basename: LIPASE:2,AMYLASE:2 in the last 72 hours  Basename 03/11/11 0600 03/10/11 0400  WBC 8.5 10.4  NEUTROABS -- --  HGB 9.0* 9.7*  HCT 27.9* 29.6*  MCV 91.2 90.8  PLT 220 189   No results found for this basename: CKTOTAL:4,CKMB:4,TROPONINI:4 in the last 72 hours No results found for this basename: POCBNP:3 in the last 72 hours No results found for this basename: DDIMER in the last 72 hours No results found for this basename: HGBA1C in the last 72 hours No results found for this basename: CHOL,HDL,LDLCALC,TRIG,CHOLHDL in the last 72 hours No results found for this basename:  TSH,T4TOTAL,FREET3,T3FREE,THYROIDAB in the last 72 hours No results found for this basename: VITAMINB12,FOLATE,FERRITIN,TIBC,IRON,RETICCTPCT in the last 72 hours  Medications: Scheduled    . aspirin  81 mg Oral Daily  . docusate sodium  200 mg Oral Daily  . furosemide  40 mg Oral BID  . insulin aspart  0-24 Units Subcutaneous Q4H  . insulin glargine  25 Units Subcutaneous BID  . levalbuterol  1.25 mg Nebulization Q6H  . metoprolol tartrate  25 mg Oral BID  . pantoprazole  40 mg Oral QAC breakfast  . potassium chloride  20 mEq Oral BID  . povidone-iodine  1 application Topical BID  . rosuvastatin  20 mg Oral q1800  . sodium chloride  3 mL Intravenous Q12H  . venlafaxine  150 mg Oral Daily     Radiology/Studies:  No results found.  INR: Will add last result for INR, ABG once components are confirmed Will add last 4 CBG results once components are confirmed  Assessment/Plan: S/P Procedure(s) (LRB): REDO CORONARY ARTERY BYPASS GRAFTING (CABG) (N/A)  1. Conts. Lasix 2. conts rehab 3.CBG with adequate control 4. Possible d/c in am if remains stable and progressing    LOS: 6 days    GOLD,WAYNE E 11/25/20129:39 AM    Chart reviewed, patient examined, agree with above. He looks good.  ICD is active.  He should be able to go home in am.

## 2011-03-13 ENCOUNTER — Encounter (HOSPITAL_COMMUNITY): Payer: Self-pay | Admitting: Cardiothoracic Surgery

## 2011-03-13 LAB — TYPE AND SCREEN
ABO/RH(D): A POS
Antibody Screen: NEGATIVE
Unit division: 0
Unit division: 0
Unit division: 0
Unit division: 0

## 2011-03-13 LAB — GLUCOSE, CAPILLARY
Glucose-Capillary: 94 mg/dL (ref 70–99)
Glucose-Capillary: 115 mg/dL — ABNORMAL HIGH (ref 70–99)
Glucose-Capillary: 115 mg/dL — ABNORMAL HIGH (ref 70–99)

## 2011-03-13 NOTE — Progress Notes (Signed)
UR Completed.  [name] Jane [phone]. 03/13/2011  

## 2011-03-13 NOTE — Progress Notes (Signed)
[id number] Cardiac Rehab Completed discharge education with pt. and wife. Pt agrees to Outpt. CRP in [place], will send referral.

## 2011-03-13 NOTE — Progress Notes (Addendum)
7 Days Post-Op  Procedure(s) (LRB): REDO CORONARY ARTERY BYPASS GRAFTING (CABG) (N/A) Subjective: No new complaints, feeling well. Ambulation coninuests to improve.  Objective  Telemetry SR,BBB,PVC's  Temp:  [97.3 F (36.3 C)-98.8 F (37.1 C)] 97.3 F (36.3 C) (11/26 0351) Pulse Rate:  [77-88] 77  (11/26 0351) Resp:  [18-20] 20  (11/26 0351) BP: (127-132)/(70-84) 132/84 mmHg (11/26 0351) SpO2:  [93 %-96 %] 94 % (11/26 0351) Weight:  [250 lb 4.8 oz (113.535 kg)] 250 lb 4.8 oz (113.535 kg) (11/26 0345)   Intake/Output Summary (Last 24 hours) at 03/13/11 0734 Last data filed at 03/12/11 1500  Gross per 24 hour  Intake    480 ml  Output    425 ml  Net     55 ml       General appearance: alert, cooperative and no distress Heart: regular rate and rhythm and S1, S2 normal Lungs: clear to auscultation bilaterally Abdomen: mild distension, soft, non tender Extremities: + LE edema Wound: incisions healing well without signs of infection  Lab Results:  Basename 03/11/11 0600  NA 137  K 3.9  CL 100  CO2 29  GLUCOSE 129*  BUN 22  CREATININE 0.96  CALCIUM 8.4  MG --  PHOS --   No results found for this basename: AST:2,ALT:2,ALKPHOS:2,BILITOT:2,PROT:2,ALBUMIN:2 in the last 72 hours No results found for this basename: LIPASE:2,AMYLASE:2 in the last 72 hours  Basename 03/11/11 0600  WBC 8.5  NEUTROABS --  HGB 9.0*  HCT 27.9*  MCV 91.2  PLT 220   No results found for this basename: CKTOTAL:4,CKMB:4,TROPONINI:4 in the last 72 hours No results found for this basename: POCBNP:3 in the last 72 hours No results found for this basename: DDIMER in the last 72 hours No results found for this basename: HGBA1C in the last 72 hours No results found for this basename: CHOL,HDL,LDLCALC,TRIG,CHOLHDL in the last 72 hours No results found for this basename: TSH,T4TOTAL,FREET3,T3FREE,THYROIDAB in the last 72 hours No results found for this basename:  VITAMINB12,FOLATE,FERRITIN,TIBC,IRON,RETICCTPCT in the last 72 hours  Medications: Scheduled    . aspirin  81 mg Oral Daily  . docusate sodium  200 mg Oral Daily  . furosemide  40 mg Oral BID  . insulin aspart  0-24 Units Subcutaneous Q4H  . insulin glargine  25 Units Subcutaneous BID  . levalbuterol  1.25 mg Nebulization Q6H  . metoprolol tartrate  25 mg Oral BID  . pantoprazole  40 mg Oral QAC breakfast  . potassium chloride  20 mEq Oral BID  . povidone-iodine  1 application Topical BID  . rosuvastatin  20 mg Oral q1800  . sodium chloride  3 mL Intravenous Q12H  . venlafaxine  150 mg Oral Daily     Radiology/Studies:  No results found.  INR: Will add last result for INR, ABG once components are confirmed Will add last 4 CBG results once components are confirmed  Assessment/Plan: S/P Procedure(s) (LRB): REDO CORONARY ARTERY BYPASS GRAFTING (CABG) (N/A)   1. Continues to make good recovery. 2. D/C epw's 3. D/C home today    GOLD,WAYNE E 11/26/20127:34 AM

## 2011-03-13 NOTE — Progress Notes (Signed)
EPWs and CT sutures removed per order and unit protocol.  All tips intact.  Pt tolerated well.  VSS, pt verbalized understanding bedrest for one hour.  Checking VS q65min x 4, then q30x2.  Monitor techs advised.  Pt resting comfortably with HOD at 30 degrees, call bell in reach.  Will monitor closely.

## 2011-03-13 NOTE — Progress Notes (Signed)
Pt d/c home via wheelchair. Pt verbalize understanding of d/c and medication instructions. Pt will follow up with physician in two weeks.

## 2011-04-05 ENCOUNTER — Other Ambulatory Visit: Payer: Self-pay | Admitting: Cardiothoracic Surgery

## 2011-04-05 DIAGNOSIS — I2510 Atherosclerotic heart disease of native coronary artery without angina pectoris: Secondary | ICD-10-CM

## 2011-04-06 ENCOUNTER — Ambulatory Visit
Admission: RE | Admit: 2011-04-06 | Discharge: 2011-04-06 | Disposition: A | Discharge status: Home / Self Care | Payer: Managed Care, Other (non HMO) | Source: Ambulatory Visit | Attending: Cardiothoracic Surgery | Admitting: Cardiothoracic Surgery

## 2011-04-06 ENCOUNTER — Ambulatory Visit (INDEPENDENT_AMBULATORY_CARE_PROVIDER_SITE_OTHER): Payer: Self-pay | Admitting: Cardiothoracic Surgery

## 2011-04-06 ENCOUNTER — Encounter: Payer: Commercial Indemnity | Admitting: *Deleted

## 2011-04-06 ENCOUNTER — Encounter: Payer: Self-pay | Admitting: Cardiothoracic Surgery

## 2011-04-06 VITALS — BP 98/67 | HR 68 | Resp 20 | Ht 67.5 in | Wt 226.0 lb

## 2011-04-06 DIAGNOSIS — I2510 Atherosclerotic heart disease of native coronary artery without angina pectoris: Secondary | ICD-10-CM

## 2011-04-06 DIAGNOSIS — Z951 Presence of aortocoronary bypass graft: Secondary | ICD-10-CM

## 2011-04-06 NOTE — Patient Instructions (Signed)
Doing well Start Cardiac Rehab No heavy lifting for 3 months

## 2011-04-06 NOTE — Progress Notes (Signed)
[address]            [place] [address]          [phone]       [name] [facility] Medical Record #[id number] Date of Birth: 1940-04-28  Quintella Reichert, MD Kari Baars, MD, MD  Chief Complaint:   PostOp Follow Up Visit  Redo coronary artery bypass grafting x2 with  reverse saphenous vein graft to the posterior descending coronary artery  and reverse saphenous vein graft to the circumflex coronary artery with  right leg endo vein harvesting. Placement of left femoral arterial  line. 03/06/2011   History of Present Illness:      Patient is doing well postoperatively he's had no recurrent angina, notes continuing to gain strength. He has no overt symptoms of congestive heart failure.     History  Smoking status  . Never Smoker   Smokeless tobacco  . Not on file       Allergies  Allergen Reactions  . Codeine Nausea And Vomiting  . Adhesive (Tape) Rash  . Latex Rash    Specifies adhesive     Current Outpatient Prescriptions  Medication Sig Dispense Refill  . allopurinol (ZYLOPRIM) 100 MG tablet Take 2 tablets by mouth once daily       . amLODipine-benazepril (LOTREL) 5-20 MG per capsule Take 1 capsule by mouth daily.        Marland Kitchen aspirin 81 MG tablet Take 81 mg by mouth daily.       Marland Kitchen atorvastatin (LIPITOR) 40 MG tablet Take 40 mg by mouth daily.        . carvedilol (COREG) 25 MG tablet Take 25 mg by mouth 2 (two) times daily.        Marland Kitchen eplerenone (INSPRA) 25 MG tablet Take 25 mg by mouth daily.        . furosemide (LASIX) 40 MG tablet Take 40 mg by mouth daily.       . insulin glargine (LANTUS) 100 UNIT/ML injection Inject 65 Units into the skin at bedtime.       . insulin glulisine (APIDRA) 100 UNIT/ML injection Inject 30 Units into the skin 3 (three) times daily before meals.        . niacin (NIASPAN) 500 MG CR tablet Take 2 tablets by mouth two times a day       . NITROSTAT 0.4 MG SL tablet Place 0.4 mg under  the tongue every 5 (five) minutes as needed.       . triamcinolone (KENALOG) 0.1 % cream Apply 1 application topically daily as needed. Apply sparingly to affected area externally two times a day as needed for itching and rash      . venlafaxine (EFFEXOR-XR) 150 MG 24 hr capsule Take 150 mg by mouth daily.        Marland Kitchen zolpidem (AMBIEN CR) 12.5 MG CR tablet Take 12.5 mg by mouth at bedtime as needed.            Physical Exam: BP 98/67  Pulse 68  Resp 20  Ht 5' 7.5" (1.715 m)  Wt 226 lb (102.513 kg)  BMI 34.87 kg/m2  SpO2 97%  General appearance: alert, cooperative, appears stated age and no distress Neurologic: intact Heart: regular rate and rhythm, S1, S2 normal, no murmur, click, rub or gallop and normal apical impulse Lungs: clear to  auscultation bilaterally Abdomen: soft, non-tender; bowel sounds normal; no masses,  no organomegaly Extremities: extremities normal, atraumatic, no cyanosis or edema, Homans sign is negative, no sign of DVT, no edema, redness or tenderness in the calves or thighs and At the right knee vein harvest site there's a small amount of eschar but without obvious infection Wounds: Patient sternum is stable and well healed without evidence of cellulitis.  Diagnostic Studies & Laboratory data:         Recent Radiology Findings: Dg Chest 2 View  04/06/2011  *RADIOLOGY REPORT*  Clinical Data: 12/1996 new follow-up of heart surgery.  CHEST - 2 VIEW  Comparison: 03/10/2011  Findings: Low volume film. The cardiopericardial silhouette is enlarged.  The bibasilar airspace disease in left effusion seen previously have resolved.  Left-sided pacer / AICD remains in place.  IMPRESSION: Interval improvement in aeration bilaterally with no acute findings on today's study.  Original Report Authenticated By: ERIC A. MANSELL, M.D.      Recent Labs: Lab Results  Component Value Date   WBC 8.5 03/11/2011   HGB 9.0* 03/11/2011   HCT 27.9* 03/11/2011   PLT 220 03/11/2011    GLUCOSE 129* 03/11/2011   ALT 22 03/02/2011   AST 21 03/02/2011   NA 137 03/11/2011   K 3.9 03/11/2011   CL 100 03/11/2011   CREATININE 0.96 03/11/2011   BUN 22 03/11/2011   CO2 29 03/11/2011   INR 1.64* 03/06/2011   HGBA1C 8.9* 03/02/2011      Assessment / Plan:      Patient has made good stable postoperative progress. In cardiac rehabilitation reopens after the holidays he will start in cardiac rehabilitation program. I've not made him a return appointment at this point would but would be glad to see him at his or Dr. Norris Cross request at any time.      Delight Ovens MD 04/06/2011 12:45 PM

## 2011-04-12 ENCOUNTER — Ambulatory Visit (INDEPENDENT_AMBULATORY_CARE_PROVIDER_SITE_OTHER): Payer: Managed Care, Other (non HMO) | Admitting: *Deleted

## 2011-04-12 ENCOUNTER — Encounter: Payer: Self-pay | Admitting: Internal Medicine

## 2011-04-12 DIAGNOSIS — I5022 Chronic systolic (congestive) heart failure: Secondary | ICD-10-CM

## 2011-04-12 DIAGNOSIS — I255 Ischemic cardiomyopathy: Secondary | ICD-10-CM

## 2011-04-12 DIAGNOSIS — I509 Heart failure, unspecified: Secondary | ICD-10-CM

## 2011-04-12 DIAGNOSIS — I2589 Other forms of chronic ischemic heart disease: Secondary | ICD-10-CM

## 2011-04-12 LAB — ICD DEVICE OBSERVATION
AL THRESHOLD: 0.875 V
BAMS-0001: 170 {beats}/min
CHARGE TIME: 3.963 s
DEV-0020ICD: NEGATIVE
FVT: 0
PACEART VT: 0
TOT-0001: 1
TOT-0002: 1
TZAT-0001ATACH: 2
TZAT-0001ATACH: 3
TZAT-0001FASTVT: 1
TZAT-0001SLOWVT: 1
TZAT-0002FASTVT: NEGATIVE
TZAT-0004SLOWVT: 8
TZAT-0005SLOWVT: 88 pct
TZAT-0011SLOWVT: 10 ms
TZAT-0012ATACH: 150 ms
TZAT-0012SLOWVT: 200 ms
TZAT-0013SLOWVT: 3
TZAT-0018ATACH: NEGATIVE
TZAT-0018SLOWVT: NEGATIVE
TZAT-0019ATACH: 6 V
TZAT-0019FASTVT: 8 V
TZAT-0020ATACH: 1.5 ms
TZAT-0020ATACH: 1.5 ms
TZON-0003ATACH: 350 ms
TZON-0003SLOWVT: 350 ms
TZON-0003VSLOWVT: 450 ms
TZON-0004SLOWVT: 16
TZON-0004VSLOWVT: 20
TZON-0005SLOWVT: 12
TZST-0001ATACH: 6
TZST-0001FASTVT: 2
TZST-0001FASTVT: 3
TZST-0001FASTVT: 6
TZST-0001SLOWVT: 2
TZST-0001SLOWVT: 3
TZST-0001SLOWVT: 5
TZST-0002ATACH: NEGATIVE
TZST-0002FASTVT: NEGATIVE
TZST-0002FASTVT: NEGATIVE
TZST-0002FASTVT: NEGATIVE
TZST-0003SLOWVT: 25 J
TZST-0003SLOWVT: 35 J
TZST-0003SLOWVT: 35 J
VENTRICULAR PACING ICD: 0.06 pct

## 2011-04-12 NOTE — Progress Notes (Signed)
ICD check with ICM 

## 2011-05-01 DIAGNOSIS — I10 Essential (primary) hypertension: Secondary | ICD-10-CM | POA: Diagnosis not present

## 2011-05-01 DIAGNOSIS — I5022 Chronic systolic (congestive) heart failure: Secondary | ICD-10-CM | POA: Diagnosis not present

## 2011-05-01 DIAGNOSIS — I2510 Atherosclerotic heart disease of native coronary artery without angina pectoris: Secondary | ICD-10-CM | POA: Diagnosis not present

## 2011-05-01 DIAGNOSIS — Z79899 Other long term (current) drug therapy: Secondary | ICD-10-CM | POA: Diagnosis not present

## 2011-05-01 DIAGNOSIS — E782 Mixed hyperlipidemia: Secondary | ICD-10-CM | POA: Diagnosis not present

## 2011-05-09 DIAGNOSIS — E782 Mixed hyperlipidemia: Secondary | ICD-10-CM | POA: Diagnosis not present

## 2011-05-09 DIAGNOSIS — IMO0001 Reserved for inherently not codable concepts without codable children: Secondary | ICD-10-CM | POA: Diagnosis not present

## 2011-05-09 DIAGNOSIS — I10 Essential (primary) hypertension: Secondary | ICD-10-CM | POA: Diagnosis not present

## 2011-05-11 ENCOUNTER — Ambulatory Visit (HOSPITAL_COMMUNITY): Payer: Managed Care, Other (non HMO)

## 2011-05-15 ENCOUNTER — Ambulatory Visit (HOSPITAL_COMMUNITY): Payer: Managed Care, Other (non HMO)

## 2011-05-17 ENCOUNTER — Ambulatory Visit (HOSPITAL_COMMUNITY): Payer: Managed Care, Other (non HMO)

## 2011-05-19 ENCOUNTER — Ambulatory Visit (HOSPITAL_COMMUNITY): Payer: Managed Care, Other (non HMO)

## 2011-05-22 ENCOUNTER — Ambulatory Visit (HOSPITAL_COMMUNITY): Payer: Managed Care, Other (non HMO)

## 2011-05-23 DIAGNOSIS — IMO0001 Reserved for inherently not codable concepts without codable children: Secondary | ICD-10-CM | POA: Diagnosis not present

## 2011-05-24 ENCOUNTER — Ambulatory Visit (HOSPITAL_COMMUNITY): Payer: Managed Care, Other (non HMO)

## 2011-05-26 ENCOUNTER — Ambulatory Visit (HOSPITAL_COMMUNITY): Payer: Managed Care, Other (non HMO)

## 2011-05-29 ENCOUNTER — Ambulatory Visit (HOSPITAL_COMMUNITY): Payer: Managed Care, Other (non HMO)

## 2011-05-31 ENCOUNTER — Ambulatory Visit (HOSPITAL_COMMUNITY): Payer: Managed Care, Other (non HMO)

## 2011-06-02 ENCOUNTER — Ambulatory Visit (HOSPITAL_COMMUNITY): Payer: Managed Care, Other (non HMO)

## 2011-06-05 ENCOUNTER — Inpatient Hospital Stay (HOSPITAL_COMMUNITY)
Admission: EM | Admit: 2011-06-05 | Discharge: 2011-06-09 | DRG: 280 | Disposition: A | Discharge status: Home / Self Care | Payer: Managed Care, Other (non HMO) | Attending: Cardiology | Admitting: Cardiology

## 2011-06-05 ENCOUNTER — Other Ambulatory Visit: Payer: Self-pay

## 2011-06-05 ENCOUNTER — Ambulatory Visit (HOSPITAL_COMMUNITY): Payer: Managed Care, Other (non HMO)

## 2011-06-05 ENCOUNTER — Encounter (HOSPITAL_COMMUNITY): Payer: Self-pay | Admitting: Emergency Medicine

## 2011-06-05 ENCOUNTER — Emergency Department (HOSPITAL_COMMUNITY): Payer: Managed Care, Other (non HMO)

## 2011-06-05 DIAGNOSIS — E119 Type 2 diabetes mellitus without complications: Secondary | ICD-10-CM | POA: Diagnosis present

## 2011-06-05 DIAGNOSIS — I447 Left bundle-branch block, unspecified: Secondary | ICD-10-CM | POA: Diagnosis present

## 2011-06-05 DIAGNOSIS — I509 Heart failure, unspecified: Secondary | ICD-10-CM | POA: Diagnosis not present

## 2011-06-05 DIAGNOSIS — I2510 Atherosclerotic heart disease of native coronary artery without angina pectoris: Secondary | ICD-10-CM | POA: Diagnosis not present

## 2011-06-05 DIAGNOSIS — R072 Precordial pain: Secondary | ICD-10-CM | POA: Diagnosis not present

## 2011-06-05 DIAGNOSIS — I5023 Acute on chronic systolic (congestive) heart failure: Secondary | ICD-10-CM | POA: Diagnosis not present

## 2011-06-05 DIAGNOSIS — I25810 Atherosclerosis of coronary artery bypass graft(s) without angina pectoris: Secondary | ICD-10-CM | POA: Diagnosis present

## 2011-06-05 DIAGNOSIS — Z8249 Family history of ischemic heart disease and other diseases of the circulatory system: Secondary | ICD-10-CM | POA: Diagnosis not present

## 2011-06-05 DIAGNOSIS — I2582 Chronic total occlusion of coronary artery: Secondary | ICD-10-CM | POA: Diagnosis present

## 2011-06-05 DIAGNOSIS — IMO0001 Reserved for inherently not codable concepts without codable children: Secondary | ICD-10-CM | POA: Diagnosis not present

## 2011-06-05 DIAGNOSIS — I10 Essential (primary) hypertension: Secondary | ICD-10-CM | POA: Diagnosis present

## 2011-06-05 DIAGNOSIS — E782 Mixed hyperlipidemia: Secondary | ICD-10-CM | POA: Diagnosis not present

## 2011-06-05 DIAGNOSIS — I5021 Acute systolic (congestive) heart failure: Secondary | ICD-10-CM | POA: Diagnosis not present

## 2011-06-05 DIAGNOSIS — R079 Chest pain, unspecified: Secondary | ICD-10-CM | POA: Diagnosis not present

## 2011-06-05 DIAGNOSIS — Z95810 Presence of automatic (implantable) cardiac defibrillator: Secondary | ICD-10-CM

## 2011-06-05 DIAGNOSIS — I214 Non-ST elevation (NSTEMI) myocardial infarction: Secondary | ICD-10-CM

## 2011-06-05 DIAGNOSIS — Z87891 Personal history of nicotine dependence: Secondary | ICD-10-CM

## 2011-06-05 DIAGNOSIS — I2589 Other forms of chronic ischemic heart disease: Secondary | ICD-10-CM | POA: Diagnosis not present

## 2011-06-05 DIAGNOSIS — I255 Ischemic cardiomyopathy: Secondary | ICD-10-CM | POA: Diagnosis present

## 2011-06-05 DIAGNOSIS — I5022 Chronic systolic (congestive) heart failure: Secondary | ICD-10-CM

## 2011-06-05 DIAGNOSIS — I5032 Chronic diastolic (congestive) heart failure: Secondary | ICD-10-CM | POA: Diagnosis not present

## 2011-06-05 HISTORY — DX: Heart failure, unspecified: I50.9

## 2011-06-05 LAB — BASIC METABOLIC PANEL
CO2: 27 mEq/L (ref 19–32)
Chloride: 98 mEq/L (ref 96–112)
Potassium: 4.2 mEq/L (ref 3.5–5.1)
Sodium: 136 mEq/L (ref 135–145)

## 2011-06-05 LAB — CBC
Hemoglobin: 11.9 g/dL — ABNORMAL LOW (ref 13.0–17.0)
Hemoglobin: 12.9 g/dL — ABNORMAL LOW (ref 13.0–17.0)
MCH: 27.4 pg (ref 26.0–34.0)
MCHC: 32.6 g/dL (ref 30.0–36.0)
Platelets: 259 10*3/uL (ref 150–400)
Platelets: 319 10*3/uL (ref 150–400)
RBC: 4.5 MIL/uL (ref 4.22–5.81)
RBC: 4.71 MIL/uL (ref 4.22–5.81)
WBC: 8.9 10*3/uL (ref 4.0–10.5)

## 2011-06-05 LAB — PRO B NATRIURETIC PEPTIDE
Pro B Natriuretic peptide (BNP): 2003 pg/mL — ABNORMAL HIGH (ref 0–125)
Pro B Natriuretic peptide (BNP): 2501 pg/mL — ABNORMAL HIGH (ref 0–125)

## 2011-06-05 LAB — DIFFERENTIAL
Basophils Relative: 0 % (ref 0–1)
Eosinophils Absolute: 0.2 10*3/uL (ref 0.0–0.7)
Neutro Abs: 7.1 10*3/uL (ref 1.7–7.7)
Neutrophils Relative %: 69 % (ref 43–77)

## 2011-06-05 LAB — PROTIME-INR
INR: 1.14 (ref 0.00–1.49)
INR: 1.13 (ref 0.00–1.49)
Prothrombin Time: 14.8 seconds (ref 11.6–15.2)
Prothrombin Time: 14.7 seconds (ref 11.6–15.2)

## 2011-06-05 LAB — COMPREHENSIVE METABOLIC PANEL
ALT: 21 U/L (ref 0–53)
AST: 34 U/L (ref 0–37)
CO2: 26 mEq/L (ref 19–32)
Calcium: 9.7 mg/dL (ref 8.4–10.5)
Chloride: 102 mEq/L (ref 96–112)
Creatinine, Ser: 0.98 mg/dL (ref 0.50–1.35)
GFR calc Af Amer: >90 mL/min (ref >90)
GFR calc non Af Amer: 83 mL/min — ABNORMAL LOW (ref >90)
Glucose, Bld: 201 mg/dL — ABNORMAL HIGH (ref 70–99)
Total Bilirubin: 0.3 mg/dL (ref 0.3–1.2)

## 2011-06-05 LAB — APTT
aPTT: 28 seconds (ref 24–37)
aPTT: 29 seconds (ref 24–37)

## 2011-06-05 LAB — D-DIMER, QUANTITATIVE: D-Dimer, Quant: 0.63 ug/mL-FEU — ABNORMAL HIGH (ref 0.00–0.48)

## 2011-06-05 LAB — MAGNESIUM: Magnesium: 2 mg/dL (ref 1.5–2.5)

## 2011-06-05 MED ORDER — INSULIN GLARGINE 100 UNIT/ML ~~LOC~~ SOLN
65.0000 [IU] | Freq: Every day | SUBCUTANEOUS | Status: DC
  Administered 2011-06-05 – 2011-06-08 (×4): 65 [IU] via SUBCUTANEOUS
  Filled 2011-06-05: qty 3

## 2011-06-05 MED ORDER — AMLODIPINE BESYLATE 5 MG PO TABS
5.0000 mg | ORAL_TABLET | Freq: Every day | ORAL | Status: DC
  Administered 2011-06-06: 5 mg via ORAL
  Filled 2011-06-05: qty 1

## 2011-06-05 MED ORDER — ENOXAPARIN SODIUM 120 MG/0.8ML ~~LOC~~ SOLN
105.0000 mg | Freq: Two times a day (BID) | SUBCUTANEOUS | Status: DC
  Administered 2011-06-05: 105 mg via SUBCUTANEOUS
  Filled 2011-06-05 (×3): qty 0.8

## 2011-06-05 MED ORDER — NITROGLYCERIN 0.4 MG SL SUBL
0.4000 mg | SUBLINGUAL_TABLET | SUBLINGUAL | Status: DC | PRN
  Filled 2011-06-05: qty 25

## 2011-06-05 MED ORDER — SIMVASTATIN 40 MG PO TABS: 40.0000 mg | ORAL_TABLET | Freq: Every day | ORAL | Status: DC

## 2011-06-05 MED ORDER — POTASSIUM CHLORIDE CRYS ER 20 MEQ PO TBCR
20.0000 meq | EXTENDED_RELEASE_TABLET | Freq: Every day | ORAL | Status: DC
  Administered 2011-06-05 – 2011-06-09 (×5): 20 meq via ORAL
  Filled 2011-06-05 (×4): qty 1

## 2011-06-05 MED ORDER — VENLAFAXINE HCL ER 150 MG PO CP24
150.0000 mg | ORAL_CAPSULE | Freq: Every day | ORAL | Status: DC
  Administered 2011-06-06 – 2011-06-09 (×4): 150 mg via ORAL
  Filled 2011-06-05 (×4): qty 1

## 2011-06-05 MED ORDER — AMLODIPINE BESY-BENAZEPRIL HCL 5-20 MG PO CAPS: 1.0000 | ORAL_CAPSULE | Freq: Every day | ORAL | Status: DC

## 2011-06-05 MED ORDER — ASPIRIN 81 MG PO TABS: 81.0000 mg | ORAL_TABLET | Freq: Every day | ORAL | Status: DC

## 2011-06-05 MED ORDER — CARVEDILOL 25 MG PO TABS
25.0000 mg | ORAL_TABLET | Freq: Two times a day (BID) | ORAL | Status: DC
  Administered 2011-06-05 – 2011-06-09 (×7): 25 mg via ORAL
  Filled 2011-06-05 (×9): qty 1

## 2011-06-05 MED ORDER — NIACIN ER (ANTIHYPERLIPIDEMIC) 500 MG PO TBCR
1000.0000 mg | EXTENDED_RELEASE_TABLET | Freq: Two times a day (BID) | ORAL | Status: DC
  Administered 2011-06-05 – 2011-06-09 (×8): 1000 mg via ORAL
  Filled 2011-06-05 (×9): qty 2

## 2011-06-05 MED ORDER — TRIAMCINOLONE ACETONIDE 0.1 % EX CREA
1.0000 "application " | TOPICAL_CREAM | Freq: Every morning | CUTANEOUS | Status: DC
  Administered 2011-06-06 – 2011-06-08 (×3): 1 via TOPICAL
  Filled 2011-06-05: qty 15

## 2011-06-05 MED ORDER — ASPIRIN EC 81 MG PO TBEC
81.0000 mg | DELAYED_RELEASE_TABLET | Freq: Every day | ORAL | Status: DC
  Administered 2011-06-06 – 2011-06-09 (×4): 81 mg via ORAL
  Filled 2011-06-05 (×4): qty 1

## 2011-06-05 MED ORDER — SPIRONOLACTONE 25 MG PO TABS
25.0000 mg | ORAL_TABLET | Freq: Every day | ORAL | Status: DC
  Administered 2011-06-06 – 2011-06-09 (×4): 25 mg via ORAL
  Filled 2011-06-05 (×4): qty 1

## 2011-06-05 MED ORDER — SODIUM CHLORIDE 0.9 % IJ SOLN
3.0000 mL | Freq: Two times a day (BID) | INTRAMUSCULAR | Status: DC
  Administered 2011-06-05 – 2011-06-09 (×9): 3 mL via INTRAVENOUS

## 2011-06-05 MED ORDER — ACETAMINOPHEN 325 MG PO TABS
650.0000 mg | ORAL_TABLET | ORAL | Status: DC | PRN
  Administered 2011-06-06 (×3): 650 mg via ORAL
  Filled 2011-06-05: qty 2
  Filled 2011-06-05: qty 1
  Filled 2011-06-05 (×4): qty 2

## 2011-06-05 MED ORDER — POTASSIUM CHLORIDE CRYS ER 20 MEQ PO TBCR
EXTENDED_RELEASE_TABLET | ORAL | Status: AC
  Administered 2011-06-05: 20 meq via ORAL
  Filled 2011-06-05: qty 1

## 2011-06-05 MED ORDER — ALLOPURINOL 100 MG PO TABS
200.0000 mg | ORAL_TABLET | Freq: Every day | ORAL | Status: DC
  Administered 2011-06-06 – 2011-06-09 (×4): 200 mg via ORAL
  Filled 2011-06-05 (×4): qty 2

## 2011-06-05 MED ORDER — ONDANSETRON HCL 4 MG/2ML IJ SOLN: 4.0000 mg | Freq: Four times a day (QID) | INTRAMUSCULAR | Status: DC | PRN

## 2011-06-05 MED ORDER — SODIUM CHLORIDE 0.9 % IJ SOLN: 3.0000 mL | INTRAMUSCULAR | Status: DC | PRN

## 2011-06-05 MED ORDER — SODIUM CHLORIDE 0.9 % IV SOLN: 250.0000 mL | INTRAVENOUS | Status: DC | PRN

## 2011-06-05 MED ORDER — INSULIN ASPART 100 UNIT/ML ~~LOC~~ SOLN
30.0000 [IU] | Freq: Three times a day (TID) | SUBCUTANEOUS | Status: DC
  Administered 2011-06-06 – 2011-06-09 (×9): 30 [IU] via SUBCUTANEOUS
  Filled 2011-06-05: qty 3

## 2011-06-05 MED ORDER — ROSUVASTATIN CALCIUM 20 MG PO TABS
20.0000 mg | ORAL_TABLET | Freq: Every day | ORAL | Status: DC
  Administered 2011-06-06 – 2011-06-08 (×3): 20 mg via ORAL
  Filled 2011-06-05 (×5): qty 1

## 2011-06-05 MED ORDER — BENAZEPRIL HCL 20 MG PO TABS
20.0000 mg | ORAL_TABLET | Freq: Every day | ORAL | Status: DC
  Administered 2011-06-06: 20 mg via ORAL
  Filled 2011-06-05: qty 1

## 2011-06-05 MED ORDER — ZOLPIDEM TARTRATE 5 MG PO TABS
5.0000 mg | ORAL_TABLET | Freq: Every evening | ORAL | Status: DC | PRN
  Administered 2011-06-05 – 2011-06-08 (×4): 5 mg via ORAL
  Filled 2011-06-05 (×4): qty 1

## 2011-06-05 MED ORDER — FUROSEMIDE 10 MG/ML IJ SOLN
40.0000 mg | Freq: Two times a day (BID) | INTRAMUSCULAR | Status: DC
  Administered 2011-06-05 – 2011-06-07 (×3): 40 mg via INTRAVENOUS
  Filled 2011-06-05 (×6): qty 4

## 2011-06-05 MED ORDER — INSULIN GLULISINE 100 UNIT/ML ~~LOC~~ SOLN: 30.0000 [IU] | Freq: Three times a day (TID) | SUBCUTANEOUS | Status: DC

## 2011-06-05 NOTE — ED Notes (Signed)
Patient remains on monitor and 2L oxygen with sats of 95%. Patient resting with NAD at this time.

## 2011-06-05 NOTE — H&P (Signed)
[name] is a 71 y.o. male  Admit date: 06/05/2011 Referring Physician: Deboraha Sprang physicians  Primary Cardiologist:: Kym Groom, M.D. Chief complaint / reason for admission: Chest pain  HPI: The patient is 71 years of age and underwent repeat coronary artery bypass by Dr. Sheliah Plane in November 2012. By one month following surgery the patient states that he was doing well. This was the last time that he saw Dr. Mayford Knife. Over the past month the patient has noted (according to his wife) exertional fatigue, more dyspnea than usual, and over the past 3 days sub-xiphoid and right upper quadrant pain with radiation into the right parasternal area. The discomfort in the right parasternal region has become much more severe today, characterized as grade 8/10, prompting coming to the emergency room.    PMH:    Past Medical History  Diagnosis Date  . Ischemic cardiomyopathy     s/p ICD Implantation by Dr Amil Amen  . Chronic systolic dysfunction of left ventricle     EF 30%  . Diabetes mellitus   . CAD (coronary artery disease)     s/p CABG 1997, PCI (BMS) of SVG to RCA 3/09  . Hyperlipemia   . DJD (degenerative joint disease)   . Gout   . HTN (hypertension) 02/14/2011  . DM (diabetes mellitus) 02/14/2011  . Myocardial infarction   . Depression   . Complication of anesthesia     ONCE WITH BACK SURGERY DIFFICULT TO WAKE UP    PSH:    Past Surgical History  Procedure Date  . Coronary stent placement 3/09    BMS to SVG to RCA  . Cardiac defibrillator placement 5/06    generator replacement with new RV lead placed by Dr Amil Amen 2011  . Back surgery 2003  . Coronary artery bypass graft 01/1996    CABG x 5 LIMA to LAD SVG to diag1,2,svg to om,svg to RCA  . Knee arthrotomy     RIGHT KNEE CARTILAGE REMOVED  . Coronary artery bypass graft 03/06/2011    Procedure: REDO CORONARY ARTERY BYPASS GRAFTING (CABG);  Surgeon: Delight Ovens, MD;  Location: [facility] OR;  Service: Open Heart  Surgery;  Laterality: N/A;  times two grafts using right saphenous vein harvested endoscopically.    ALLERGIES:   Codeine; Adhesive; and Latex  Prior to Admit Meds:   (Not in a hospital admission) Family HX:    Family History  Problem Relation Age of Onset  . Coronary artery disease     Social HX:    History   Social History  . Marital Status: Married    Spouse Name: N/A    Number of Children: N/A  . Years of Education: N/A   Occupational History  . Not on file.   Social History Main Topics  . Smoking status: Former Smoker -- 1.0 packs/day  . Smokeless tobacco: Former Neurosurgeon    Quit date: 05/05/1971  . Alcohol Use: 0.6 oz/week    1 Cans of beer per week     occasional  . Drug Use: No  . Sexually Active: Not on file   Other Topics Concern  . Not on file   Social History Narrative   Lives in [place], retired Production designer, theatre/television/film for [employer].  He collects antique metal toys    Review of systems: He denies chills, fever, nitroglycerin use, orthopnea, edema, palpitations, syncope, and transient neurological symptoms.  Physical Exam: Blood pressure 124/64, pulse 86, temperature 99.1 F (37.3 C),  temperature source Oral, resp. rate 18, height 5\' 9"  (1.753 m), weight 104.327 kg (230 lb), SpO2 95.00%.    Skin is normal in color  HEENT exam reveals pupils that are equal and reactive to light. No jaundice is noted.  Neck exam reveals moderate JVD with the patient lying at 45. No carotid bruits are heard.  Chest reveals decreased breath sounds at the bases with rales half the way up bilaterally.  The cardiac exam reveals an S3 gallop. 2 of 6 systolic murmur is heard in the apex with radiation to the left axilla.  Abdomen is obese. Abdomen is soft. Bowel sounds are normal.  Extremities reveal no edema. Radial pulses brachial pulses popliteal pulses and posterior tibial pulses are 2+ and symmetric throughout  On neurological examination the patient appears  nervous/changes. No focal deficit, tremor, or motor abnormality is noted. Labs:   Lab Results  Component Value Date   WBC 8.9 06/05/2011   HGB 11.9* 06/05/2011   HCT 38.1* 06/05/2011   MCV 84.7 06/05/2011   PLT 259 06/05/2011    Lab 06/05/11 1601  NA 136  K 4.2  CL 98  CO2 27  BUN 21  CREATININE 1.11  CALCIUM 9.5  PROT --  BILITOT --  ALKPHOS --  ALT --  AST --  GLUCOSE 315*    Troponin 0.51   Radiology:  *RADIOLOGY REPORT*  Clinical Data: Chest pain and shortness of breath.  CHEST - 2 VIEW  Comparison: 04/06/2011  Findings: The heart is enlarged but stable. Stable surgical  changes from bypass surgery. The pacer wires / AICD are stable.  There is mild vascular congestion but no overt pulmonary edema.  Slightly low lung volumes with vascular crowding and atelectasis.  No pleural effusions. The bony thorax is intact.  IMPRESSION:  Cardiac enlargement and vascular congestion.  Low lung volumes with vascular crowding and atelectasis.  Original Report Authenticated By: P. [name], M.D.   EKG:  Normal sinus rhythm with left bundle branch block not significantly different than November 2012.  ASSESSMENT:   1. Ischemic cardiomyopathy with chronic systolic heart failure (less than or equal to 30% EF) possibly mildly decompensated at this time. Patient has AICD and may need to be upgraded to a biventricular pacing device.  2. Chest pain, uncertain cause. History of diabetes. Recent redo coronary bypass grafting. Troponin of 0.5 by point-of-care will need to be confirmed.  3. CAD with repeat coronary bypass grafting performed in November 2012 but not he'll Gerhardt  4. Diabetes mellitus  5. Not sure about the patient's overall affect and there could be a psychological component at play  Plan:  1. Admit to [facility] to rule out myocardial infarction.  2. Heparin IV  3. Reassess LV function as last echocardiogram in October 2012 was not of good  quality  4. IV diuresis  5. Further management and workup per Dr. Mayford Knife. [name] 06/05/2011 7:06 PM

## 2011-06-05 NOTE — Progress Notes (Signed)
Patient arrived to unit 4700. Connected to telemetry and orders reviewed. Vitals signs and weight taken. Patient is currently eating a snack and resting comfortably with wife and daughter at his bedside.

## 2011-06-05 NOTE — Progress Notes (Signed)
ANTICOAGULATION CONSULT NOTE - Initial Consult  Pharmacy Consult for Lovenox Indication: chest pain/ACS  Allergies  Allergen Reactions  . Codeine Nausea And Vomiting  . Adhesive (Tape) Rash  . Latex Rash    Specifies adhesive     Patient Measurements: Height: 5\' 8"  (172.7 cm) Weight: 231 lb 11.3 oz (105.1 kg) IBW/kg (Calculated) : 68.4  Heparin Dosing Weight: 105.1 kg  Vital Signs: Temp: 99 F (37.2 C) (02/18 2013) Temp src: Oral (02/18 2013) BP: 135/84 mmHg (02/18 2013) Pulse Rate: 90  (02/18 2013)  Labs:  Basename 06/05/11 1601  HGB 11.9*  HCT 38.1*  PLT 259  APTT 28  LABPROT 14.8  INR 1.14  HEPARINUNFRC --  CREATININE 1.11  CKTOTAL --  CKMB --  TROPONINI --   Estimated Creatinine Clearance: 74.9 ml/min (by C-G formula based on Cr of 1.11).  Medical History: Past Medical History  Diagnosis Date  . Ischemic cardiomyopathy     s/p ICD Implantation by Dr Amil Amen  . Chronic systolic dysfunction of left ventricle     EF 30%  . Diabetes mellitus   . CAD (coronary artery disease)     s/p CABG 1997, PCI (BMS) of SVG to RCA 3/09  . Hyperlipemia   . DJD (degenerative joint disease)   . Gout   . HTN (hypertension) 02/14/2011  . DM (diabetes mellitus) 02/14/2011  . Myocardial infarction   . Depression   . Complication of anesthesia     ONCE WITH BACK SURGERY DIFFICULT TO WAKE UP    Medications:  Prescriptions prior to admission  Medication Sig Dispense Refill  . allopurinol (ZYLOPRIM) 100 MG tablet Take 200 mg by mouth daily.       Marland Kitchen amLODipine-benazepril (LOTREL) 5-20 MG per capsule Take 1 capsule by mouth daily.       Marland Kitchen aspirin 81 MG tablet Take 81 mg by mouth daily.       Marland Kitchen atorvastatin (LIPITOR) 40 MG tablet Take 40 mg by mouth daily.       . carvedilol (COREG) 25 MG tablet Take 25 mg by mouth 2 (two) times daily.       Marland Kitchen eplerenone (INSPRA) 25 MG tablet Take 25 mg by mouth daily.       . furosemide (LASIX) 40 MG tablet Take 40 mg by mouth daily.        . insulin glargine (LANTUS) 100 UNIT/ML injection Inject 65 Units into the skin at bedtime.       . insulin glulisine (APIDRA) 100 UNIT/ML injection Inject 30 Units into the skin 3 (three) times daily before meals.       . niacin (NIASPAN) 500 MG CR tablet Take 1,000 mg by mouth 2 (two) times daily. Take 2 tablets by mouth two times a day      . NITROSTAT 0.4 MG SL tablet Place 0.4 mg under the tongue every 5 (five) minutes as needed. For chest pain      . triamcinolone (KENALOG) 0.1 % cream Apply 1 application topically daily as needed. Apply sparingly to affected area externally two times a day as needed for itching and rash      . venlafaxine (EFFEXOR-XR) 150 MG 24 hr capsule Take 150 mg by mouth daily.        Marland Kitchen zolpidem (AMBIEN CR) 12.5 MG CR tablet Take 12.5 mg by mouth at bedtime as needed. For sleep        Assessment: Increasing exertional fatigue, dyspnea, and CP with known CAD with  repeat CABG 11/12. Also has other significant cardiac PMH. Patient has elevated proBNP 2003. Elevated DDimer 0.63 and CBG 315.  Plan to start LMWH for CP, r/o MI. CBC ok (Hgb 11.9 and platelets 259), with Scr 1.11 and CrCl around 75  Goal of Therapy:  Therapeutic LMWH   Plan:  Lovenox 105mg  sq q12h. CBC every 72hrs on LMWH Will f/u cardiac enzymes  Misty Stanley Stillinger 06/05/2011,8:58 PM

## 2011-06-05 NOTE — ED Provider Notes (Signed)
History     CSN: [id number]  Arrival date & time 06/05/11  1529   First MD Initiated Contact with Patient 06/05/11 1534      Chief Complaint  Patient presents with  . Chest Pain  . Shortness of Breath    (Consider location/radiation/quality/duration/timing/severity/associated sxs/prior treatment) HPI Patient presents with complaint of chest pain. He states that he has had intermittent chest pain over the past one month however the pain was worse over the past 2 days and much worse today. Chest pain is associated with increasing shortness of breath. The pain is located in his mid to right chest. He has 0/10 pain at the time of my evaluation after having received nitroglycerin by EMS. He is approximately 3 months status post CABG procedure and had been doing well until approximately one month ago. He states he is more short of breath upon minimal exertion over the past month. He denies any leg swelling or leg pain. He denies any fevers or significant cough. Pain was not associated with nausea or radiation. There no other associated systemic symptoms. Exertion makes the pain worse and nitroglycerin makes it better. There no other alleviating or modifying factors.  Past Medical History  Diagnosis Date  . Ischemic cardiomyopathy     s/p ICD Implantation by Dr Amil Amen  . Chronic systolic dysfunction of left ventricle     EF 30%  . CAD (coronary artery disease)     s/p CABG 1997, PCI (BMS) of SVG to RCA 3/09  . Hyperlipemia   . DJD (degenerative joint disease)   . Gout   . HTN (hypertension) 02/14/2011  . Depression   . Complication of anesthesia     ONCE WITH BACK SURGERY DIFFICULT TO WAKE UP  . CHF (congestive heart failure)   . Angina   . Myocardial infarction 1997  . Cardiac defibrillator in situ   . DM (diabetes mellitus) 02/14/2011  . Shortness of breath on exertion     Past Surgical History  Procedure Date  . Cardiac defibrillator placement 08/2004    initial placement  .  Knee arthrotomy ~ 1978    RIGHT KNEE CARTILAGE REMOVED  . Coronary angioplasty with stent placement 06/2007    BMS to SVG to RCA  . Coronary angioplasty 02/2011  . Coronary artery bypass graft 01/1996    CABG x 5 LIMA to LAD SVG to diag1,2,svg to om,svg to RCA  . Coronary artery bypass graft 03/06/2011    CABG X2; Procedure: REDO CORONARY ARTERY BYPASS GRAFTING (CABG);  Surgeon: Delight Ovens, MD;  Location: [facility] OR;  Service: Open Heart Surgery;  Laterality: N/A;  times two grafts using right saphenous vein harvested endoscopically.  . Cataract extraction w/ intraocular lens  implant, bilateral 2012  . Cardiac defibrillator placement 2009  . Lumbar disc surgery 2003  . Back surgery 2003    Family History  Problem Relation Age of Onset  . Coronary artery disease      History  Substance Use Topics  . Smoking status: Former Smoker -- 0.5 packs/day for 15 years    Types: Cigarettes    Quit date: 04/17/1969  . Smokeless tobacco: Never Used  . Alcohol Use: 0.6 oz/week    1 Cans of beer per week      Review of Systems ROS reviewed and otherwise negative except for mentioned in HPI  Allergies  Codeine; Adhesive; and Latex  Home Medications   No current outpatient prescriptions on file.  BP 135/84  Pulse  90  Temp(Src) 99 F (37.2 C) (Oral)  Resp 20  Ht 5\' 8"  (1.727 m)  Wt 231 lb 11.3 oz (105.1 kg)  BMI 35.23 kg/m2  SpO2 91% Vitals reviewed Physical Exam Physical Examination: General appearance - alert, well appearing, and in no distress Mental status - alert, oriented to person, place, and time Mouth - mucous membranes moist, pharynx normal without lesions Chest - clear to auscultation, no wheezes, rales or rhonchi, symmetric air entry Heart - normal rate, regular rhythm, normal S1, S2, no murmurs, rubs, clicks or gallops Abdomen - soft, nontender, nondistended, no masses or organomegaly Neurological - alert, oriented, normal speech, no focal  findings Musculoskeletal - no joint tenderness, deformity or swelling Extremities - peripheral pulses normal, no pedal edema, no clubbing or cyanosis Skin - normal coloration and turgor, no rashes  ED Course  Procedures (including critical care time)  5:11 PM- d/w Dr. Katrinka Blazing, he will see patient in the ED.  Discussed CT angio which he recommends cancelling at this time.  Also discussed starting heparin, but he will see patient in the ED and make recommendations.    6:41 PM Dr. Katrinka Blazing at bedside   Date: 06/05/2011  Rate: 95  Rhythm: sinus rhythm  QRS Axis: normal  Intervals: normal  ST/T Wave abnormalities: indeterminate  Conduction Disutrbances:left bundle branch block  Narrative Interpretation:   Old EKG Reviewed: unchanged compared to EKG of 03/07/11    Labs Reviewed  CBC - Abnormal; Notable for the following:    Hemoglobin 11.9 (*)    HCT 38.1 (*)    RDW 15.6 (*)    All other components within normal limits  BASIC METABOLIC PANEL - Abnormal; Notable for the following:    Glucose, Bld 315 (*)    GFR calc non Af Amer 66 (*)    GFR calc Af Amer 77 (*)    All other components within normal limits  PRO B NATRIURETIC PEPTIDE - Abnormal; Notable for the following:    Pro B Natriuretic peptide (BNP) 2003.0 (*)    All other components within normal limits  D-DIMER, QUANTITATIVE - Abnormal; Notable for the following:    D-Dimer, Quant 0.63 (*)    All other components within normal limits  POCT I-STAT TROPONIN I - Abnormal; Notable for the following:    Troponin i, poc 0.51 (*)    All other components within normal limits  GLUCOSE, CAPILLARY - Abnormal; Notable for the following:    Glucose-Capillary 196 (*)    All other components within normal limits  COMPREHENSIVE METABOLIC PANEL - Abnormal; Notable for the following:    Glucose, Bld 201 (*)    Albumin 3.3 (*)    GFR calc non Af Amer 83 (*)    All other components within normal limits  CBC - Abnormal; Notable for the  following:    Hemoglobin 12.9 (*)    RDW 15.7 (*)    All other components within normal limits  DIFFERENTIAL - Abnormal; Notable for the following:    Monocytes Absolute 1.1 (*)    All other components within normal limits  CARDIAC PANEL(CRET KIN+CKTOT+MB+TROPI) - Abnormal; Notable for the following:    Total CK 284 (*)    CK, MB 25.0 (*)    Troponin I 5.06 (*)    Relative Index 8.8 (*)    All other components within normal limits  PRO B NATRIURETIC PEPTIDE - Abnormal; Notable for the following:    Pro B Natriuretic peptide (BNP) 2501.0 (*)  All other components within normal limits  GLUCOSE, CAPILLARY - Abnormal; Notable for the following:    Glucose-Capillary 210 (*)    All other components within normal limits  GLUCOSE, CAPILLARY - Abnormal; Notable for the following:    Glucose-Capillary 266 (*)    All other components within normal limits  PROTIME-INR  APTT  PROTIME-INR  MAGNESIUM  APTT  BASIC METABOLIC PANEL  TSH  CARDIAC PANEL(CRET KIN+CKTOT+MB+TROPI)   Dg Chest 2 View  06/05/2011  *RADIOLOGY REPORT*  Clinical Data: Chest pain and shortness of breath.  CHEST - 2 VIEW  Comparison: 04/06/2011  Findings: The heart is enlarged but stable.  Stable surgical changes from bypass surgery.  The pacer wires / AICD are stable. There is mild vascular congestion but no overt pulmonary edema. Slightly low lung volumes with vascular crowding and atelectasis. No pleural effusions.  The bony thorax is intact.  IMPRESSION: Cardiac enlargement and vascular congestion. Low lung volumes with vascular crowding and atelectasis.  Original Report Authenticated By: P. [name], M.D.     1. NSTEMI (non-ST elevated myocardial infarction)   2. Chest pain   3. CHF (congestive heart failure)       MDM  Patient presenting with worsening chest pain and shortness of breath over the past one month with acute worsening today. He was referred from the equal cardiology earlier this afternoon for  further evaluation. His EKG showed no acute changes and a left bundle branch which was present on prior EKGs. His troponin was mildly elevated. His chest pain was 0/10 after receiving nitroglycerin by EMS. Patient was evaluated in the ED by Dr. Katrinka Blazing, cardiology, and patient was admitted to cardiology service for further management.        Ethelda Chick, MD 06/06/11 [phone]

## 2011-06-05 NOTE — Progress Notes (Signed)
On Call MD notified  About elevated d-dimer. Patient has lovenox ordered and per on call MD primary MD will address in the morning. Will continue to monitor.

## 2011-06-05 NOTE — ED Notes (Signed)
IV team on way to patients room.

## 2011-06-05 NOTE — Progress Notes (Signed)
MD notified about critical lab values of troponin and CK-MB. MD order to have patient NPO after midnight for possible cardiac cath. Patient having intermittent chest pain which was identified in the emergency dept. EKG was done VS stable at 150/81 HR 94 temp 98.8 O2 saturation at 95% on 2L. Will continue to monitor.

## 2011-06-05 NOTE — ED Notes (Signed)
CBG CHECKED BY EMT R [id number]

## 2011-06-05 NOTE — ED Notes (Signed)
Patient resting with NAD at this time. Patient remains on monitor and 2L oxygen with sats of 97%. Family at the bedside.

## 2011-06-05 NOTE — ED Notes (Signed)
Patient remains on monitor and NAD at this time. Family at bedside.

## 2011-06-05 NOTE — ED Notes (Signed)
Pt to ED via EMS from Dr's office [name]) with c/o chest pain and SOB

## 2011-06-05 NOTE — Progress Notes (Signed)
CRITICAL VALUE ALERT  Critical value received:  Troponin 5.06 CK-MB 25  Date of notification:  06/05/2011  Time of notification: 930pm  Critical value read back: yes  Nurse who received alert:  A.Pretty Weltman  MD notified (1st page):  Dr. Donnie Aho   Time of first page:  9:30pm  MD notified (2nd page):  Time of second page:  Responding MD:  Dr. Donnie Aho  Time MD responded:  945

## 2011-06-05 NOTE — ED Notes (Signed)
Patient denies chest pain. Patient states he feel a little tightness across his lower abdomen.

## 2011-06-05 NOTE — ED Notes (Signed)
4730-01 [place]

## 2011-06-05 NOTE — ED Notes (Signed)
Report given to [facility], RN on 4700. Patient placed on Zoll and is being transported to 4730.

## 2011-06-05 NOTE — ED Notes (Signed)
IV Team paged 

## 2011-06-05 NOTE — ED Notes (Signed)
Pt undressed, in gown, on monitor, continuous pulse oximetry, blood pressure cuff and oxygen [place] (2L); EKG performed 

## 2011-06-06 ENCOUNTER — Encounter (HOSPITAL_COMMUNITY)
Admission: EM | Disposition: A | Discharge status: Home / Self Care | Payer: Self-pay | Source: Home / Self Care | Attending: Cardiology

## 2011-06-06 ENCOUNTER — Encounter (HOSPITAL_COMMUNITY): Payer: Self-pay | Admitting: Cardiology

## 2011-06-06 ENCOUNTER — Other Ambulatory Visit: Payer: Self-pay

## 2011-06-06 DIAGNOSIS — I214 Non-ST elevation (NSTEMI) myocardial infarction: Secondary | ICD-10-CM | POA: Diagnosis present

## 2011-06-06 DIAGNOSIS — I2510 Atherosclerotic heart disease of native coronary artery without angina pectoris: Secondary | ICD-10-CM | POA: Diagnosis not present

## 2011-06-06 DIAGNOSIS — I2589 Other forms of chronic ischemic heart disease: Secondary | ICD-10-CM | POA: Diagnosis not present

## 2011-06-06 DIAGNOSIS — I5022 Chronic systolic (congestive) heart failure: Secondary | ICD-10-CM

## 2011-06-06 DIAGNOSIS — R079 Chest pain, unspecified: Secondary | ICD-10-CM | POA: Diagnosis not present

## 2011-06-06 DIAGNOSIS — I509 Heart failure, unspecified: Secondary | ICD-10-CM | POA: Diagnosis not present

## 2011-06-06 HISTORY — PX: LEFT HEART CATHETERIZATION WITH CORONARY ANGIOGRAM: SHX5451

## 2011-06-06 LAB — GLUCOSE, CAPILLARY
Glucose-Capillary: 172 mg/dL — ABNORMAL HIGH (ref 70–99)
Glucose-Capillary: 91 mg/dL (ref 70–99)
Glucose-Capillary: 145 mg/dL — ABNORMAL HIGH (ref 70–99)

## 2011-06-06 LAB — BASIC METABOLIC PANEL
BUN: 20 mg/dL (ref 6–23)
CO2: 28 mEq/L (ref 19–32)
Chloride: 102 mEq/L (ref 96–112)
Creatinine, Ser: 1.13 mg/dL (ref 0.50–1.35)
GFR calc Af Amer: 75 mL/min — ABNORMAL LOW (ref >90)
Potassium: 4 mEq/L (ref 3.5–5.1)

## 2011-06-06 LAB — CARDIAC PANEL(CRET KIN+CKTOT+MB+TROPI)
CK, MB: 18.5 ng/mL (ref 0.3–4.0)
Relative Index: 7.8 — ABNORMAL HIGH (ref 0.0–2.5)
Relative Index: 6.7 — ABNORMAL HIGH (ref 0.0–2.5)
Total CK: 237 U/L — ABNORMAL HIGH (ref 7–232)
Total CK: 165 U/L (ref 7–232)
Troponin I: 6.98 ng/mL (ref <0.30)

## 2011-06-06 SURGERY — LEFT HEART CATHETERIZATION WITH CORONARY ANGIOGRAM
Anesthesia: LOCAL

## 2011-06-06 MED ORDER — BENAZEPRIL HCL 40 MG PO TABS
40.0000 mg | ORAL_TABLET | Freq: Every day | ORAL | Status: DC
  Administered 2011-06-07 – 2011-06-09 (×3): 40 mg via ORAL
  Filled 2011-06-06 (×3): qty 1

## 2011-06-06 MED ORDER — ASPIRIN 81 MG PO CHEW: 324.0000 mg | CHEWABLE_TABLET | ORAL | Status: DC

## 2011-06-06 MED ORDER — SODIUM CHLORIDE 0.9 % IV SOLN
250.0000 mL | INTRAVENOUS | Status: DC
  Administered 2011-06-06: 1 mL via INTRAVENOUS

## 2011-06-06 MED ORDER — SODIUM CHLORIDE 0.9 % IJ SOLN: 3.0000 mL | INTRAMUSCULAR | Status: DC | PRN

## 2011-06-06 MED ORDER — DIAZEPAM 5 MG PO TABS: 5.0000 mg | ORAL_TABLET | ORAL | Status: DC

## 2011-06-06 MED ORDER — SODIUM CHLORIDE 0.9 % IJ SOLN: 3.0000 mL | Freq: Two times a day (BID) | INTRAMUSCULAR | Status: DC

## 2011-06-06 MED ORDER — FENTANYL CITRATE 0.05 MG/ML IJ SOLN
INTRAMUSCULAR | Status: AC
  Filled 2011-06-06: qty 2

## 2011-06-06 MED ORDER — SODIUM CHLORIDE 0.9 % IJ SOLN
3.0000 mL | Freq: Two times a day (BID) | INTRAMUSCULAR | Status: DC
  Administered 2011-06-06 – 2011-06-08 (×4): 3 mL via INTRAVENOUS

## 2011-06-06 MED ORDER — ONDANSETRON HCL 4 MG/2ML IJ SOLN: 4.0000 mg | Freq: Four times a day (QID) | INTRAMUSCULAR | Status: DC | PRN

## 2011-06-06 MED ORDER — LIDOCAINE HCL (PF) 1 % IJ SOLN
INTRAMUSCULAR | Status: AC
  Filled 2011-06-06: qty 30

## 2011-06-06 MED ORDER — ACETAMINOPHEN 325 MG PO TABS
650.0000 mg | ORAL_TABLET | ORAL | Status: DC | PRN
  Administered 2011-06-07: 650 mg via ORAL

## 2011-06-06 MED ORDER — ENOXAPARIN SODIUM 100 MG/ML ~~LOC~~ SOLN
100.0000 mg | Freq: Two times a day (BID) | SUBCUTANEOUS | Status: DC
  Filled 2011-06-06 (×2): qty 1

## 2011-06-06 MED ORDER — MIDAZOLAM HCL 2 MG/2ML IJ SOLN
INTRAMUSCULAR | Status: AC
  Filled 2011-06-06: qty 2

## 2011-06-06 MED ORDER — FUROSEMIDE 10 MG/ML IJ SOLN
INTRAMUSCULAR | Status: AC
  Administered 2011-06-06: 40 mg via INTRAVENOUS
  Filled 2011-06-06: qty 4

## 2011-06-06 MED ORDER — SODIUM CHLORIDE 0.9 % IV SOLN: 250.0000 mL | INTRAVENOUS | Status: DC | PRN

## 2011-06-06 MED ORDER — HEPARIN (PORCINE) IN NACL 2-0.9 UNIT/ML-% IJ SOLN
INTRAMUSCULAR | Status: AC
  Filled 2011-06-06: qty 1000

## 2011-06-06 MED ORDER — NITROGLYCERIN 0.2 MG/ML ON CALL CATH LAB
INTRAVENOUS | Status: AC
  Filled 2011-06-06: qty 1

## 2011-06-06 MED ORDER — ISOSORBIDE MONONITRATE ER 30 MG PO TB24
30.0000 mg | ORAL_TABLET | Freq: Every day | ORAL | Status: DC
  Administered 2011-06-06 – 2011-06-09 (×4): 30 mg via ORAL
  Filled 2011-06-06 (×4): qty 1

## 2011-06-06 MED ORDER — CLOPIDOGREL BISULFATE 75 MG PO TABS
75.0000 mg | ORAL_TABLET | Freq: Every day | ORAL | Status: DC
  Administered 2011-06-07 – 2011-06-09 (×3): 75 mg via ORAL
  Filled 2011-06-06 (×4): qty 1

## 2011-06-06 MED ORDER — HEPARIN SODIUM (PORCINE) 5000 UNIT/ML IJ SOLN
5000.0000 [IU] | Freq: Three times a day (TID) | INTRAMUSCULAR | Status: DC
  Administered 2011-06-06 – 2011-06-09 (×8): 5000 [IU] via SUBCUTANEOUS
  Filled 2011-06-06 (×12): qty 1

## 2011-06-06 NOTE — Progress Notes (Signed)
Paged Dr. Mayford Knife to inform her that lasix not given due to 2D echo with contrast & pt going down for cath at this time., also left message with  Judie Grieve , RN transporting pt, to inform MD.  Amanda Pea, RN

## 2011-06-06 NOTE — Progress Notes (Signed)
Offered to ambulate pt in hallway, pt refused.  States " I  Will wait until my wife get back."  Instructed pt to call before getting OOB for assist.  Verbalized understanding.  Amanda Pea, RN

## 2011-06-06 NOTE — Progress Notes (Signed)
Pt states "I'm not to eat because they going to do a cath today."  Dr . Mayford Knife notified of pt being NPO & also if pt can have lasix at this time.  MD instructed to keep pt NPO and ok to give lasix as ordered.  Amanda Pea, RN

## 2011-06-06 NOTE — Plan of Care (Signed)
Problem: Phase I Progression Outcomes Goal: Vascular site scale level 0 - I Vascular Site Scale Level 0: No bruising/bleeding/hematoma Level I (Mild): Bruising/Ecchymosis, minimal bleeding/ooozing, palpable hematoma < 3 cm Level II (Moderate): Bleeding not affecting hemodynamic parameters, pseudoaneurysm, palpable hematoma > 3 cm Outcome: Progressing Rt. Groin level 0

## 2011-06-06 NOTE — Progress Notes (Signed)
  Echocardiogram 2D Echocardiogram has been performed.  [name] 06/06/2011, 9:55 AM

## 2011-06-06 NOTE — Consult Note (Signed)
ELECTROPHYSIOLOGY CONSULT NOTE    Patient ID: Brandon WEDDINGTON MRN: [id number], DOB/AGE: 1940/10/05 71 y.o.  Admit date: 06/05/2011 Date of Consult: 06-06-2011  Primary Physician: Kari Baars, MD, MD Primary Cardiologist: Armanda Magic, MD Electrophysiologist: [name], MD  Reason for Consultation: Congestive heart failure, question upgrade of device to CRT-D  HPI: [name] is a 71 year old male with a history of coronary artery disease status post CABG in 1997 and redo CABG in November of 2011.  [name] has an ischemic cardiomyopathy and is status post placement of a Medtronic ICD in 2006.  [name] received a [employer] Fidel[employer] lead at that time which was subsequently replaced with a 6947 lead.  [name] has had appropriate therapy with ATP for VT.  After redo bypass in 2011, [name] did well for 4-6 weeks and in the last month has had significant exercise intolerance and shortness of breath. [name] has been compliant with medications and states that his blood pressure readings have been good at home.  [name] began having chest pain and was admitted on 06-05-2011.  [name] has ruled in for NSTEMI.  [name] has not had chest pain since admission.  [name] underwent catheterization by Dr Mayford Knife today which demonstrated no options for revascularization.  EP has been asked to evaluate for appropriateness of device upgrade to CRT.  Past Medical History  Diagnosis Date  . Ischemic cardiomyopathy     s/p ICD Implantation by Dr Amil Amen  . Chronic systolic dysfunction of left ventricle     EF 30%  . CAD (coronary artery disease)     s/p CABG 1997, PCI (BMS) of SVG to RCA 3/09  . Hyperlipemia   . DJD (degenerative joint disease)   . Gout   . HTN (hypertension) 02/14/2011  . Depression   . Complication of anesthesia     ONCE WITH BACK SURGERY DIFFICULT TO WAKE UP  . CHF (congestive heart failure)   . Angina   . Myocardial infarction 1997  . Cardiac defibrillator in situ   . DM (diabetes mellitus) 02/14/2011  . Shortness of breath  on exertion      Surgical History:  Past Surgical History  Procedure Date  . Cardiac defibrillator placement 08/2004    initial placement  . Knee arthrotomy ~ 1978    RIGHT KNEE CARTILAGE REMOVED  . Coronary angioplasty with stent placement 06/2007    BMS to SVG to RCA  . Coronary angioplasty 02/2011  . Coronary artery bypass graft 01/1996    CABG x 5 LIMA to LAD SVG to diag1,2,svg to om,svg to RCA  . Coronary artery bypass graft 03/06/2011    CABG X2; Procedure: REDO CORONARY ARTERY BYPASS GRAFTING (CABG);  Surgeon: Delight Ovens, MD;  Location: [facility] OR;  Service: Open Heart Surgery;  Laterality: N/A;  times two grafts using right saphenous vein harvested endoscopically.  . Cataract extraction w/ intraocular lens  implant, bilateral 2012  . Cardiac defibrillator placement 2009  . Lumbar disc surgery 2003  . Back surgery 2003     Prescriptions prior to admission  Medication Sig Dispense Refill  . allopurinol (ZYLOPRIM) 100 MG tablet Take 200 mg by mouth daily.       Marland Kitchen amLODipine-benazepril (LOTREL) 5-20 MG per capsule Take 1 capsule by mouth daily.       Marland Kitchen aspirin 81 MG tablet Take 81 mg by mouth daily.       Marland Kitchen atorvastatin (LIPITOR) 40 MG tablet Take 40 mg by mouth daily.       Marland Kitchen  carvedilol (COREG) 25 MG tablet Take 25 mg by mouth 2 (two) times daily.       Marland Kitchen eplerenone (INSPRA) 25 MG tablet Take 25 mg by mouth daily.       . furosemide (LASIX) 40 MG tablet Take 40 mg by mouth daily.       . insulin glargine (LANTUS) 100 UNIT/ML injection Inject 65 Units into the skin at bedtime.       . insulin glulisine (APIDRA) 100 UNIT/ML injection Inject 30 Units into the skin 3 (three) times daily before meals.       . niacin (NIASPAN) 500 MG CR tablet Take 1,000 mg by mouth 2 (two) times daily. Take 2 tablets by mouth two times a day      . NITROSTAT 0.4 MG SL tablet Place 0.4 mg under the tongue every 5 (five) minutes as needed. For chest pain      . triamcinolone (KENALOG) 0.1 % cream  Apply 1 application topically daily as needed. Apply sparingly to affected area externally two times a day as needed for itching and rash      . venlafaxine (EFFEXOR-XR) 150 MG 24 hr capsule Take 150 mg by mouth daily.        Marland Kitchen zolpidem (AMBIEN CR) 12.5 MG CR tablet Take 12.5 mg by mouth at bedtime as needed. For sleep       Inpatient Medications:    . allopurinol  200 mg Oral Daily  . amLODipine  5 mg Oral Daily  . aspirin EC  81 mg Oral Daily  . benazepril  20 mg Oral Daily  . carvedilol  25 mg Oral BID  . fentaNYL      . furosemide      . furosemide  40 mg Intravenous Q12H  . heparin      . heparin subcutaneous  5,000 Units Subcutaneous Q8H  . insulin aspart  30 Units Subcutaneous TID WC  . insulin glargine  65 Units Subcutaneous QHS  . lidocaine      . midazolam      . niacin  1,000 mg Oral BID  . nitroGLYCERIN      . potassium chloride  20 mEq Oral Daily  . rosuvastatin  20 mg Oral q1800  . sodium chloride  3 mL Intravenous Q12H  . sodium chloride  3 mL Intravenous Q12H  . spironolactone  25 mg Oral Daily  . triamcinolone cream  1 application Topical q morning - 10a  . venlafaxine  150 mg Oral Daily   Allergies:  Allergies  Allergen Reactions  . Codeine Nausea And Vomiting  . Adhesive (Tape) Rash  . Latex Rash    Specifies adhesive     History   Social History  . Marital Status: Married    Spouse Name: N/A    Number of Children: N/A  . Years of Education: N/A   Social History Main Topics  . Smoking status: Former Smoker -- 0.5 packs/day for 15 years    Types: Cigarettes    Quit date: 04/17/1969  . Smokeless tobacco: Never Used  . Alcohol Use: 0.6 oz/week    1 Cans of beer per week  . Drug Use: No  . Sexually Active: No   Social History Narrative   Lives in [place], retired Production designer, theatre/television/film for [employer].  [name] collects antique metal toys     Family History  Problem Relation Age of Onset  . Coronary artery disease      Labs:  Lab  Results  Component Value Date   WBC 10.3 06/05/2011   HGB 12.9* 06/05/2011   HCT 39.6 06/05/2011   MCV 84.1 06/05/2011   PLT 319 06/05/2011    Lab 06/06/11 0406 06/05/11 2023  NA 138 --  K 4.0 --  CL 102 --  CO2 28 --  BUN 20 --  CREATININE 1.13 --  CALCIUM 9.2 --  PROT -- 7.4  BILITOT -- 0.3  ALKPHOS -- 91  ALT -- 21  AST -- 34  GLUCOSE 164* --   Lab Results  Component Value Date   CKTOTAL 165 06/06/2011   CKMB 11.0* 06/06/2011   TROPONINI 2.21* 06/06/2011   Lab Results  Component Value Date   DDIMER 0.63* 06/05/2011   Radiology/Studies: Dg Chest 2 View 06/05/2011  *RADIOLOGY REPORT*  Clinical Data: Chest pain and shortness of breath.  CHEST - 2 VIEW  Comparison: 04/06/2011  Findings: The heart is enlarged but stable.  Stable surgical changes from bypass surgery.  The pacer wires / AICD are stable. There is mild vascular congestion but no overt pulmonary edema. Slightly low lung volumes with vascular crowding and atelectasis. No pleural effusions.  The bony thorax is intact.  IMPRESSION: Cardiac enlargement and vascular congestion. Low lung volumes with vascular crowding and atelectasis.  Original Report Authenticated By: P. [name], M.D.   EKG:SR with 1st degree AV block, LBBB, QRS 152  TELEMETRY: sinus rhythm with occasional PVC's  DEVICE HISTORY: MDT ICD implanted 2006 with 5076 RA lead and 6949 RV lead.  Device change out in 2011 with MDT ICD.  [phone] lead capped at that time and 6947 lead implanted.   Assessment/Impression/Plan:  1.  Ischemic cardiomyopathy, LBBB, NYHA Class III CHF- The patient has had significant decline since I saw him in the office last.  [name] is s/p redo CABG 11/12.  [name] remains on a robust medicine regimen for CHF but continues to have progressive symptoms.  [name] now presents with anginal symptoms and is found to have significant elevation of CMs suggesting NSTEMI.  Cath today reveals likely occlusion of an SVG. I do think that [name] would be a good  candidate for upgrade of his ICD to CRT-D.  However, given his admission with MI, I think that we should defer device upgrade for several weeks while antianginal medications are adjusted.  [name] may benefit from increasing amlodipine or addition of long acting nitrates. I am concerned that his L subclavian vein may be occluded as it has been accessed multiple times previously.  I will therefore proceed with a venogram of the LUE in the EP on Thursday to see if his L subclavian vein is open with plans to return in 3-4 weeks if stable for device upgrade at that time.   Fayrene Fearing Dania Marsan,MD 5:23 PM 06/06/2011

## 2011-06-06 NOTE — Progress Notes (Signed)
Pt sat at bedside to eat dinner & had gotten back to bed.  Rt. Groin site remain level 0.  Will cont. To monitor.  Emmelyn Schmale,RN

## 2011-06-06 NOTE — Op Note (Signed)
PROCEDURE:  Left heart catheterization with selective coronary angiography, left ventriculogram.  INDICATIONS:  NSTEMI/ acute on chronic systolic heart failure  The risks, benefits, and details of the procedure were explained to the patient.  The patient verbalized understanding and wanted to proceed.  Informed written consent was obtained.  PROCEDURE TECHNIQUE:  After Xylocaine anesthesia a 65F sheath was placed in the right femoral artery with a single anterior needle wall stick.   Left coronary angiography was done using a Judkins L4 guide catheter.  Right coronary angiography was done using a Judkins R4 guide catheter.  Left ventriculography was not done due to elevated LVEDP. SVG to diagonal angiography was done using a Judkins R4 catheter.  SVG to left circumflex was done using a Judkins LCB catheter via the hood from prior SVG to diagonal site.  SVG to PDA was done using a Judkins R4 catheter via the hood from prior SVG to RCA site.  LIMA angiography was done using a LIMA catheter.  LV pressure was measured using a pigtail catheter.     COMPLICATIONS:  None.    HEMODYNAMICS:  Aortic pressure was 120/38mmHg; LV pressure was 120/74mmHg; LVEDP .  There was no gradient between the left ventricle and aorta.    ANGIOGRAPHIC DATA:   The left main coronary artery is patent in the proximal portion but in the distal portion there is an 80% narrowing.  The vessel then bifurcates into an LAD and left circumflex.  The left anterior descending artery is patent in its proximal portion giving rise to a small first diagonal.  The LAD is then occluded..  The left circumflex artery is patent in its proximal portions giving rise to a small first OM branch and a second OM branch.  At the takeoff of the OM2 there is an 80% narrowing of the left circumflex.  The ongoing circumflex traverses the AV groove and gives rise to a large OM3.  .  The right coronary artery is is occluded proximally.  The LIMA to  the LAD graft is widely patent with evidence of distal LAD flow and patent distal LAD.    The SVG to the diagonal is chronically occluded.  The SVG to the OM is chronically occluded.    There is an SVG to left circumflex that was grafted via the hood of the old SVG to diagonal and is now occluded.  There is an old SVG to RCA with an 80% lesion proximal to the insertion of the RCA and via the same hood a new SVG to the PDA is present which is mildly atretic but widely patent.    LEFT VENTRICULOGRAM:  Left ventricular angiogram was not performed due to elevated LVEDP IMPRESSIONS:  1. 80% distal left main coronary artery stenosis. 2. Occluded mid LAD with good distal flow via LIMA graft. 3. 80% stenosis of mid left circumflex at takeoff of OM2. 4. Proximal occlusion of RCA. 5.  Patent LIMA to LAD with good distal flow in distal LAD 6.  Occluded SVG to left circumflex 7.  Mildly atretic but widely patent SVG to PDA with 80% stenosis of old SVG to RCA at anastamosis 8.  Chronically occluded SVG to diagonal 9.  Chronically occluded SVG to OM 10.  Occluded new SVG to left circumfllex 11.  Severe  left ventricular systolic dy function by echo in past EF 30%.  LVEDP 34 mmHg.   RECOMMENDATION:  1.  Medical therapy of CAD with nitrates/ASA/Plavix/beta blockers 2.  Consult EP for  possible upgrade of AICD to BiV due to chronic CHF with persistently elevated optivol and elevated LVEDP and prolonged QRS duration. 3.  IV diuresis for acute on chronic systolic HF 4.  Continue ACE I/aldactone/beta blocker for CHF 5.  Start Imdur 30mg  daily 6.  Add Plavix 75mg  daily 7.  Stop amlodipine 8.  Increase Benazepril to 40mg  daily as BP tolerates

## 2011-06-06 NOTE — Progress Notes (Signed)
06/06/11 1430 UR Completed. Tera Mater, RN, BSN [phone]

## 2011-06-06 NOTE — Progress Notes (Signed)
SUBJECTIVE:  Had 3 days of chest pain somewhat atypical which was relieved with SL NTG.  No further CP since then OBJECTIVE:   Vitals:   Filed Vitals:   06/05/11 2013 06/06/11 0228 06/06/11 0500 06/06/11 0615  BP: 135/84 106/57  118/74  Pulse: 90 78  83  Temp: 99 F (37.2 C) 97.7 F (36.5 C)  97.7 F (36.5 C)  TempSrc: Oral Oral  Oral  Resp: 20 18  18   Height: 5\' 8"  (1.727 m)     Weight: 105.1 kg (231 lb 11.3 oz)  104.599 kg (230 lb 9.6 oz) 102.694 kg (226 lb 6.4 oz)  SpO2: 91% 92%  94%   I&O's:   Intake/Output Summary (Last 24 hours) at 06/06/11 1003 Last data filed at 06/06/11 0900  Gross per 24 hour  Intake    360 ml  Output   1400 ml  Net  -1040 ml   TELEMETRY: Reviewed telemetry pt in NSR    PHYSICAL EXAM General: Well developed, well nourished, in no acute distress Head: Eyes PERRLA, No xanthomas.   Normal cephalic and atramatic  Lungs:   Clear bilaterally to auscultation and percussion. Heart:   HRRR S1 S2 Pulses are 2+ & equal.            No carotid bruit. No JVD.  No abdominal bruits. No femoral bruits. Abdomen: Bowel sounds are positive, abdomen soft and non-tender without masses  Extremities:   No clubbing, cyanosis or edema.  DP +1 Neuro: Alert and oriented X 3. Psych:  Good affect, responds appropriately   LABS: Basic Metabolic Panel:  Basename 06/06/11 0406 06/05/11 2023  NA 138 137  K 4.0 4.0  CL 102 102  CO2 28 26  GLUCOSE 164* 201*  BUN 20 19  CREATININE 1.13 0.98  CALCIUM 9.2 9.7  MG -- 2.0  PHOS -- --   Liver Function Tests:  [facility] 06/05/11 2023  AST 34  ALT 21  ALKPHOS 91  BILITOT 0.3  PROT 7.4  ALBUMIN 3.3*    CBC:  Basename 06/05/11 2023 06/05/11 1601  WBC 10.3 8.9  NEUTROABS 7.1 --  HGB 12.9* 11.9*  HCT 39.6 38.1*  MCV 84.1 84.7  PLT 319 259   Cardiac Enzymes:  Basename 06/06/11 0406 06/05/11 2020  CKTOTAL 237* 284*  CKMB 18.5* 25.0*  CKMBINDEX -- --  TROPONINI 6.98* 5.06*  D-Dimer:  Basename 06/05/11 1601   DDIMER 0.63*    Thyroid Function Tests:  Basename 06/05/11 2023  TSH 1.331  T4TOTAL --  T3FREE --  THYROIDAB --   Anemia Panel: No results found for this basename: VITAMINB12,FOLATE,FERRITIN,TIBC,IRON,RETICCTPCT in the last 72 hours Coag Panel:   Lab Results  Component Value Date   INR 1.13 06/05/2011   INR 1.14 06/05/2011   INR 1.64* 03/06/2011    RADIOLOGY: Dg Chest 2 View  06/05/2011  *RADIOLOGY REPORT*  Clinical Data: Chest pain and shortness of breath.  CHEST - 2 VIEW  Comparison: 04/06/2011  Findings: The heart is enlarged but stable.  Stable surgical changes from bypass surgery.  The pacer wires / AICD are stable. There is mild vascular congestion but no overt pulmonary edema. Slightly low lung volumes with vascular crowding and atelectasis. No pleural effusions.  The bony thorax is intact.  IMPRESSION: Cardiac enlargement and vascular congestion. Low lung volumes with vascular crowding and atelectasis.  Original Report Authenticated By: P. [name], M.D.     ASSESSMENT: 1.  NSTEMI with no further chest pain 2.  CAD with recent redo CABG November 2012 3.  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  Acute on Chronic systolic dysfunction with elevated BNP 5.  DM 6.  Mildly elevated DDimer  7.  HTN  PLAN:   1.  His cardiac enzymes are elevated more than what usually is seen with acute CHF or PE.  I have recommended proceeding with heart cath to evaluate patency of grafts. 2.  NPO 3.  Cardiac catheterization was discussed with the patient fully including risks on myocardial infarction, death, stroke, bleeding, arrhythmia, dye allergy, renal insufficiency or bleeding.  All patient questions and concerns were discussed and the patient understands and is willing to proceed.      Quintella Reichert, MD  06/06/2011  10:03 AM

## 2011-06-06 NOTE — Progress Notes (Signed)
Pt back from cath lab A&0 x4.    Rt. Groin dsg. D&I.  Instructed on post cath orders keeping Rt. Leg straight, & bedrest x 4 hours & more as per written orders.  Pt. & spouse verbalized understanding.  Will cont. To monitor.  Amanda Pea, RN

## 2011-06-06 NOTE — Interval H&P Note (Signed)
History and Physical Interval Note:  06/06/2011 10:57 AM  [name]  has presented today for surgery, with the diagnosis of chest pain  The various methods of treatment have been discussed with the patient and family. After consideration of risks, benefits and other options for treatment, the patient has consented to  Procedure(s) (LRB): LEFT HEART CATHETERIZATION WITH CORONARY ANGIOGRAM (N/A) as a surgical intervention .  The patients' history has been reviewed, patient examined, no change in status, stable for surgery.  I have reviewed the patients' chart and labs.  Questions were answered to the patient's satisfaction.     Estephany Perot R

## 2011-06-06 NOTE — Progress Notes (Signed)
Unable to administer lasix at this time pt is having 2D echo done at bedside.   Amanda Pea, RN

## 2011-06-07 ENCOUNTER — Ambulatory Visit (HOSPITAL_COMMUNITY): Payer: Managed Care, Other (non HMO)

## 2011-06-07 DIAGNOSIS — I2510 Atherosclerotic heart disease of native coronary artery without angina pectoris: Secondary | ICD-10-CM | POA: Diagnosis not present

## 2011-06-07 DIAGNOSIS — I5021 Acute systolic (congestive) heart failure: Secondary | ICD-10-CM | POA: Diagnosis not present

## 2011-06-07 LAB — BASIC METABOLIC PANEL
BUN: 24 mg/dL — ABNORMAL HIGH (ref 6–23)
CO2: 27 mEq/L (ref 19–32)
Calcium: 9 mg/dL (ref 8.4–10.5)
Creatinine, Ser: 1.36 mg/dL — ABNORMAL HIGH (ref 0.50–1.35)

## 2011-06-07 LAB — GLUCOSE, CAPILLARY
Glucose-Capillary: 163 mg/dL — ABNORMAL HIGH (ref 70–99)
Glucose-Capillary: 139 mg/dL — ABNORMAL HIGH (ref 70–99)
Glucose-Capillary: 149 mg/dL — ABNORMAL HIGH (ref 70–99)

## 2011-06-07 MED ORDER — FUROSEMIDE 10 MG/ML IJ SOLN
40.0000 mg | Freq: Every day | INTRAMUSCULAR | Status: DC
  Administered 2011-06-09: 40 mg via INTRAVENOUS
  Filled 2011-06-07 (×2): qty 4

## 2011-06-07 MED ORDER — ALPRAZOLAM 0.25 MG PO TABS
0.2500 mg | ORAL_TABLET | Freq: Once | ORAL | Status: AC
  Administered 2011-06-07: 0.25 mg via ORAL
  Filled 2011-06-07: qty 1

## 2011-06-07 NOTE — Progress Notes (Signed)
Patient 's BP is now 94/54. Patient is not complaining of feeling lightheaded any longer. Will continue to monitor BP and patient for further changes.

## 2011-06-07 NOTE — Progress Notes (Signed)
Patient's BP is 88/58 and is complaining of feeling lightheaded. MD notified and no new orders given. Will recheck BP after lunch and continue to monitor patient for any further changes.

## 2011-06-07 NOTE — Progress Notes (Signed)
SUBJECTIVE:  Feels better.  Able to lie flat. Still urinating quite a bit.  OBJECTIVE:   Vitals:   Filed Vitals:   06/07/11 0500 06/07/11 0900 06/07/11 1030 06/07/11 1233  BP: 102/65 102/60 112/64 110/68  Pulse: 74 78 74 88  Temp: 98 F (36.7 C) 98.4 F (36.9 C)  97.4 F (36.3 C)  TempSrc: Oral Oral  Oral  Resp: 19 18  16   Height:      Weight: 103.4 kg (227 lb 15.3 oz)     SpO2: 95% 96%  98%   I&O's:   Intake/Output Summary (Last 24 hours) at 06/07/11 1313 Last data filed at 06/07/11 1000  Gross per 24 hour  Intake   1180 ml  Output   1050 ml  Net    130 ml   TELEMETRY: Reviewed telemetry pt in NSR with PVCs     PHYSICAL EXAM General: Well developed, well nourished, in no acute distress Head: No xanthomas.   Normal cephalic and atraumatic  Lungs:  Bibasilar crackles Heart: HRRR S1 S2 premature beats Abdomen:  abdomen soft and non-tender without masses or                  Hernia's noted. Msk:  Back normal,  Normal strength and tone for age. Extremities:  Trace edema.   Neuro: Alert and oriented Psych:  Good affect, responds appropriately   LABS: Basic Metabolic Panel:  Basename 06/07/11 0549 06/06/11 0406 06/05/11 2023  NA 136 138 --  K 4.0 4.0 --  CL 99 102 --  CO2 27 28 --  GLUCOSE 167* 164* --  BUN 24* 20 --  CREATININE 1.36* 1.13 --  CALCIUM 9.0 9.2 --  MG -- -- 2.0  PHOS -- -- --   Liver Function Tests:  [facility] 06/05/11 2023  AST 34  ALT 21  ALKPHOS 91  BILITOT 0.3  PROT 7.4  ALBUMIN 3.3*   No results found for this basename: LIPASE:2,AMYLASE:2 in the last 72 hours CBC:  Basename 06/05/11 2023 06/05/11 1601  WBC 10.3 8.9  NEUTROABS 7.1 --  HGB 12.9* 11.9*  HCT 39.6 38.1*  MCV 84.1 84.7  PLT 319 259   Cardiac Enzymes:  Basename 06/06/11 1344 06/06/11 0406 06/05/11 2020  CKTOTAL 165 237* 284*  CKMB 11.0* 18.5* 25.0*  CKMBINDEX -- -- --  TROPONINI 2.21* 6.98* 5.06*   BNP: No components found with this basename:  POCBNP:3 D-Dimer:  Basename 06/05/11 1601  DDIMER 0.63*   Hemoglobin A1C: No results found for this basename: HGBA1C in the last 72 hours Fasting Lipid Panel: No results found for this basename: CHOL,HDL,LDLCALC,TRIG,CHOLHDL,LDLDIRECT in the last 72 hours Thyroid Function Tests:  Basename 06/05/11 2023  TSH 1.331  T4TOTAL --  T3FREE --  THYROIDAB --   Anemia Panel: No results found for this basename: VITAMINB12,FOLATE,FERRITIN,TIBC,IRON,RETICCTPCT in the last 72 hours Coag Panel:   Lab Results  Component Value Date   INR 1.13 06/05/2011   INR 1.14 06/05/2011   INR 1.64* 03/06/2011    RADIOLOGY: Dg Chest 2 View  06/05/2011  *RADIOLOGY REPORT*  Clinical Data: Chest pain and shortness of breath.  CHEST - 2 VIEW  Comparison: 04/06/2011  Findings: The heart is enlarged but stable.  Stable surgical changes from bypass surgery.  The pacer wires / AICD are stable. There is mild vascular congestion but no overt pulmonary edema. Slightly low lung volumes with vascular crowding and atelectasis. No pleural effusions.  The bony thorax is intact.  IMPRESSION: Cardiac  enlargement and vascular congestion. Low lung volumes with vascular crowding and atelectasis.  Original Report Authenticated By: P. [name], M.D.      ASSESSMENT: Non STEMI, CHF  PLAN:  Bypass graft disease.  Medically managed.  No angina at this time.  Planning upgrade of ICD to biV ICD.  Venogram tomorrow.  Diuresed quite a bit.  Creatinine has increased slightly.  No orthopnea. We'll decrease IV Lasix.    Corky Crafts., MD  06/07/2011  1:13 PM

## 2011-06-08 ENCOUNTER — Encounter (HOSPITAL_COMMUNITY)
Admission: EM | Disposition: A | Discharge status: Home / Self Care | Payer: Self-pay | Source: Home / Self Care | Attending: Cardiology

## 2011-06-08 DIAGNOSIS — I2510 Atherosclerotic heart disease of native coronary artery without angina pectoris: Secondary | ICD-10-CM | POA: Diagnosis not present

## 2011-06-08 DIAGNOSIS — I5023 Acute on chronic systolic (congestive) heart failure: Secondary | ICD-10-CM | POA: Diagnosis not present

## 2011-06-08 DIAGNOSIS — I2589 Other forms of chronic ischemic heart disease: Secondary | ICD-10-CM

## 2011-06-08 DIAGNOSIS — I214 Non-ST elevation (NSTEMI) myocardial infarction: Secondary | ICD-10-CM | POA: Diagnosis not present

## 2011-06-08 DIAGNOSIS — I509 Heart failure, unspecified: Secondary | ICD-10-CM

## 2011-06-08 HISTORY — PX: VENOGRAM: SHX5497

## 2011-06-08 LAB — BASIC METABOLIC PANEL
BUN: 21 mg/dL (ref 6–23)
CO2: 23 mEq/L (ref 19–32)
Calcium: 9.3 mg/dL (ref 8.4–10.5)
GFR calc non Af Amer: 63 mL/min — ABNORMAL LOW (ref >90)
Glucose, Bld: 98 mg/dL (ref 70–99)
Potassium: 4.2 mEq/L (ref 3.5–5.1)
Sodium: 138 mEq/L (ref 135–145)

## 2011-06-08 LAB — GLUCOSE, CAPILLARY
Glucose-Capillary: 98 mg/dL (ref 70–99)
Glucose-Capillary: 231 mg/dL — ABNORMAL HIGH (ref 70–99)
Glucose-Capillary: 222 mg/dL — ABNORMAL HIGH (ref 70–99)
Glucose-Capillary: 179 mg/dL — ABNORMAL HIGH (ref 70–99)

## 2011-06-08 SURGERY — VENOGRAM
Anesthesia: LOCAL

## 2011-06-08 MED ORDER — FUROSEMIDE 40 MG PO TABS
40.0000 mg | ORAL_TABLET | Freq: Two times a day (BID) | ORAL | Status: DC
  Administered 2011-06-08 – 2011-06-09 (×2): 40 mg via ORAL
  Filled 2011-06-08 (×5): qty 1

## 2011-06-08 MED ORDER — SODIUM CHLORIDE 0.9 % IV SOLN
INTRAVENOUS | Status: DC
  Administered 2011-06-08: 11:00:00 via INTRAVENOUS

## 2011-06-08 MED ORDER — CEFAZOLIN SODIUM 1-5 GM-% IV SOLN
1.0000 g | INTRAVENOUS | Status: DC
  Filled 2011-06-08: qty 50

## 2011-06-08 NOTE — Progress Notes (Signed)
Am lasix held due to venogram. MD aware. See new order.

## 2011-06-08 NOTE — Progress Notes (Signed)
SUBJECTIVE: The patient is doing well today.  At this time, he denies chest pain, shortness of breath, or any new concerns.     Marland Kitchen allopurinol  200 mg Oral Daily  . aspirin EC  81 mg Oral Daily  . benazepril  40 mg Oral Daily  . carvedilol  25 mg Oral BID  . clopidogrel  75 mg Oral Q breakfast  . furosemide  40 mg Intravenous Daily  . heparin subcutaneous  5,000 Units Subcutaneous Q8H  . insulin aspart  30 Units Subcutaneous TID WC  . insulin glargine  65 Units Subcutaneous QHS  . isosorbide mononitrate  30 mg Oral Daily  . niacin  1,000 mg Oral BID  . potassium chloride  20 mEq Oral Daily  . rosuvastatin  20 mg Oral q1800  . sodium chloride  3 mL Intravenous Q12H  . sodium chloride  3 mL Intravenous Q12H  . spironolactone  25 mg Oral Daily  . triamcinolone cream  1 application Topical q morning - 10a  . venlafaxine  150 mg Oral Daily  . DISCONTD: furosemide  40 mg Intravenous Q12H      . sodium chloride 1 mL (06/06/11 1934)    OBJECTIVE: Physical Exam: Filed Vitals:   06/07/11 1430 06/07/11 1900 06/07/11 2122 06/08/11 0439  BP: 85/51 93/56 104/64 117/59  Pulse: 71 69 71 76  Temp: 97.6 F (36.4 C)  98.1 F (36.7 C) 97.6 F (36.4 C)  TempSrc: Oral  Oral Oral  Resp: 18  18 18   Height:      Weight:    230 lb 4.8 oz (104.463 kg)  SpO2: 98%  97% 99%    Intake/Output Summary (Last 24 hours) at 06/08/11 4540 Last data filed at 06/08/11 0836  Gross per 24 hour  Intake    792 ml  Output   1300 ml  Net   -508 ml    Telemetry reveals sinus rhythm  GEN- The patient is well appearing, alert and oriented x 3 today.   Head- normocephalic, atraumatic Eyes-  Sclera clear, conjunctiva pink Ears- hearing intact Oropharynx- clear Lungs- Clear to ausculation bilaterally, normal work of breathing Heart- Regular rate and rhythm, no murmurs, rubs or gallops, PMI not laterally displaced GI- soft, NT, ND, + BS Extremities- no clubbing, cyanosis, or edema  LABS: Basic  Metabolic Panel:  Basename 06/08/11 0628 06/07/11 0549 06/05/11 2023  NA 138 136 --  K 4.2 4.0 --  CL 102 99 --  CO2 23 27 --  GLUCOSE 98 167* --  BUN 21 24* --  CREATININE 1.16 1.36* --  CALCIUM 9.3 9.0 --  MG -- -- 2.0  PHOS -- -- --   Liver Function Tests:  [facility] 06/05/11 2023  AST 34  ALT 21  ALKPHOS 91  BILITOT 0.3  PROT 7.4  ALBUMIN 3.3*   No results found for this basename: LIPASE:2,AMYLASE:2 in the last 72 hours CBC:  Basename 06/05/11 2023 06/05/11 1601  WBC 10.3 8.9  NEUTROABS 7.1 --  HGB 12.9* 11.9*  HCT 39.6 38.1*  MCV 84.1 84.7  PLT 319 259   Cardiac Enzymes:  Basename 06/06/11 1344 06/06/11 0406 06/05/11 2020  CKTOTAL 165 237* 284*  CKMB 11.0* 18.5* 25.0*  CKMBINDEX -- -- --  TROPONINI 2.21* 6.98* 5.06*   BNP: No components found with this basename: POCBNP:3 D-Dimer:  Basename 06/05/11 1601  DDIMER 0.63*   Hemoglobin A1C: No results found for this basename: HGBA1C in the last 72 hours Fasting Lipid  Panel: No results found for this basename: CHOL,HDL,LDLCALC,TRIG,CHOLHDL,LDLDIRECT in the last 72 hours Thyroid Function Tests:  Basename 06/05/11 2023  TSH 1.331  T4TOTAL --  T3FREE --  THYROIDAB --    ASSESSMENT AND PLAN:  Principal Problem:  *NSTEMI (non-ST elevated myocardial infarction) Active Problems:  Chronic systolic congestive heart failure  Ischemic cardiomyopathy  Chest pain at rest  1. Ischemic cardiomyopathy, LBBB, NYHA Class III CHF-  The patient has had significant decline since I saw him in the office last. He is s/p redo CABG 11/12.  He is now admitted with significant elevation of CMs suggesting NSTEMI. Cath revealed likely occlusion of an SVG.   I do think that he would be a good candidate for upgrade of his ICD to CRT-D. However, given his admission with MI, I think that we should defer device upgrade for several weeks while antianginal medications are adjusted.   I am concerned that his L subclavian vein may  be occluded as it has been accessed multiple times previously. I will therefore proceed with a venogram of the LUE in the EP today to see if his L subclavian vein is open with plans to return in 3-4 weeks if stable for device upgrade at that time.  Risk, benefits, and alternatives to venogram were discussed at length with the patient.  He understands that risks include but are not limted to allergy to contrast, local skin reaction, and renal failure.  He accepts these risks and wishes to proceed.   [name], MD 06/08/2011 9:37 AM

## 2011-06-08 NOTE — Progress Notes (Signed)
Subjective:  Got back from venogram about one hour ago. No SOB, no CP. Lasix was held prior to procedure.   Objective:  Vital Signs in the last 24 hours: Temp:  [97.5 F (36.4 C)-98.1 F (36.7 C)] 97.5 F (36.4 C) (02/21 1241) Pulse Rate:  [69-78] 73  (02/21 1241) Resp:  [18] 18  (02/21 1241) BP: (85-117)/(51-73) 103/63 mmHg (02/21 1241) SpO2:  [95 %-99 %] 95 % (02/21 1241) Weight:  [104.463 kg (230 lb 4.8 oz)] 104.463 kg (230 lb 4.8 oz) (02/21 0439)  Intake/Output from previous day: 02/20 0701 - 02/21 0700 In: 1032 [P.O.:1020; IV Piggyback:12] Out: 1450 [Urine:1450]   Physical Exam: AAO x 3 in NAD laying flat  RRR, soft SM JVD mild Obese, soft abd Trace edema BLE   Lab Results:  [facility] 06/05/11 2023 06/05/11 1601  WBC 10.3 8.9  HGB 12.9* 11.9*  PLT 319 259    Basename 06/08/11 0628 06/07/11 0549  NA 138 136  K 4.2 4.0  CL 102 99  CO2 23 27  GLUCOSE 98 167*  BUN 21 24*  CREATININE 1.16 1.36*    Basename 06/06/11 1344 06/06/11 0406  TROPONINI 2.21* 6.98*   Hepatic Function Panel  Basename 06/05/11 2023  PROT 7.4  ALBUMIN 3.3*  AST 34  ALT 21  ALKPHOS 91  BILITOT 0.3  BILIDIR --  IBILI --   Telemetry: NSR, BBB, no VT Personally viewed.   Cardiac Studies:  Venogram - able to use current site.   Assessment/Plan:  Principal Problem:  *NSTEMI (non-ST elevated myocardial infarction) Active Problems:  Chronic systolic congestive heart failure  Ischemic cardiomyopathy  Chest pain at rest  -Will resume lasix -Reassuring venogram -At goal Bb, Ace-I -Occluded SVG  -NSTEMI - Plavix, statin, BB, ASA. Decreased EF.  -Hopeful dc in am with near future Bi-V upgrade (wide complex QRS).  -DM - stable -HL - no changes. LDL goal 70.   [name] 06/08/2011, 2:15 PM

## 2011-06-08 NOTE — Op Note (Signed)
SURGEON:  [name], MD     PREPROCEDURE DIAGNOSIS:  Chronic systolic dysfunction    POSTPROCEDURE DIAGNOSIS:  Chronic systolic dysfunction     PROCEDURES:   1. Left upper extremity venography.     INTRODUCTION: [name] is a 71 y.o. male  with a history of ischemic CM and chronic systolic dysfunction s/p prior BiV ICD implant by Dr Amil Amen with subsequent RV lead revision who presents today for left upper extremity venography to evaluate patency of his left subclavian vein.   Upgrade of his ICD to a CRT-D system is anticipated within the coming weeks.  It was felt prudent to evaluate his LUE venous anatomy prior to the procedure.    DESCRIPTION OF PROCEDURE:  Informed written consent was obtained, and the patient was brought to the electrophysiology lab in a fasting state.  The patient required no sedation for the procedure today.  Adequate IV access was assured.  Left Upper Extremity Venography: A venogram of the left upper extremity was performed, which revealed a large left axillary vein, which emptied into a moderate sized left subclavian vein.  Though the left subclavian vein was patent, there appeared to be moderate stenosis at the puncture site of the previously implanted defibrillator leads.  There were no early apparent complications.     CONCLUSIONS:   1.  Patent left axillary and subclavian veins with moderate stenosis at the puncture site of the previously implanted leads.  There were no significant collateral vessels to suggest more advanced occlusion.         [name], MD 06/08/2011 1:16 PM

## 2011-06-09 ENCOUNTER — Ambulatory Visit (HOSPITAL_COMMUNITY): Payer: Managed Care, Other (non HMO)

## 2011-06-09 DIAGNOSIS — I2510 Atherosclerotic heart disease of native coronary artery without angina pectoris: Secondary | ICD-10-CM | POA: Diagnosis not present

## 2011-06-09 DIAGNOSIS — I509 Heart failure, unspecified: Secondary | ICD-10-CM | POA: Diagnosis not present

## 2011-06-09 DIAGNOSIS — I214 Non-ST elevation (NSTEMI) myocardial infarction: Secondary | ICD-10-CM | POA: Diagnosis not present

## 2011-06-09 DIAGNOSIS — R079 Chest pain, unspecified: Secondary | ICD-10-CM | POA: Diagnosis not present

## 2011-06-09 LAB — GLUCOSE, CAPILLARY: Glucose-Capillary: 127 mg/dL — ABNORMAL HIGH (ref 70–99)

## 2011-06-09 MED ORDER — POTASSIUM CHLORIDE CRYS ER 20 MEQ PO TBCR: 20.0000 meq | EXTENDED_RELEASE_TABLET | Freq: Every day | ORAL | Status: DC

## 2011-06-09 MED ORDER — CLOPIDOGREL BISULFATE 75 MG PO TABS: 75.0000 mg | ORAL_TABLET | Freq: Every day | ORAL | Status: DC

## 2011-06-09 MED ORDER — BENAZEPRIL HCL 40 MG PO TABS: 40.0000 mg | ORAL_TABLET | Freq: Every day | ORAL | Status: DC

## 2011-06-09 MED ORDER — ISOSORBIDE MONONITRATE ER 30 MG PO TB24: 30.0000 mg | ORAL_TABLET | Freq: Every day | ORAL | Status: DC

## 2011-06-09 MED ORDER — FUROSEMIDE 40 MG PO TABS: 40.0000 mg | ORAL_TABLET | Freq: Two times a day (BID) | ORAL | Status: DC

## 2011-06-09 NOTE — Progress Notes (Signed)
SUBJECTIVE:  Doing well wants to go home  OBJECTIVE:   Vitals:   Filed Vitals:   06/08/11 1241 06/08/11 1501 06/08/11 2054 06/09/11 0534  BP: 103/63 96/59 107/63 109/65  Pulse: 73 62 75 66  Temp: 97.5 F (36.4 C) 97.9 F (36.6 C) 98.4 F (36.9 C) 97.3 F (36.3 C)  TempSrc:   Oral Oral  Resp: 18 18 18 19   Height:      Weight:    103.7 kg (228 lb 9.9 oz)  SpO2: 95% 96% 95% 96%   I&O's:   Intake/Output Summary (Last 24 hours) at 06/09/11 1610 Last data filed at 06/09/11 9604  Gross per 24 hour  Intake    900 ml  Output   1075 ml  Net   -175 ml   TELEMETRY: Reviewed telemetry pt in NSR     PHYSICAL EXAM General: Well developed, well nourished, in no acute distress Head: Eyes PERRLA, No xanthomas.   Normal cephalic and atramatic  Lungs:   Clear bilaterally to auscultation and percussion. Heart:   HRRR S1 S2 Pulses are 2+ & equal. Abdomen: Bowel sounds are positive, abdomen soft and non-tender without masses Extremities:   No clubbing, cyanosis or edema.  DP +1 Neuro: Alert and oriented X 3. Psych:  Good affect, responds appropriately   LABS: Basic Metabolic Panel:  Basename 06/08/11 0628 06/07/11 0549  NA 138 136  K 4.2 4.0  CL 102 99  CO2 23 27  GLUCOSE 98 167*  BUN 21 24*  CREATININE 1.16 1.36*  CALCIUM 9.3 9.0  MG -- --  PHOS -- --   Cardiac Enzymes:  Basename 06/06/11 1344  CKTOTAL 165  CKMB 11.0*  CKMBINDEX --  TROPONINI 2.21*   Coag Panel:   Lab Results  Component Value Date   INR 1.13 06/05/2011   INR 1.14 06/05/2011   INR 1.64* 03/06/2011    RADIOLOGY: Dg Chest 2 View  06/05/2011  *RADIOLOGY REPORT*  Clinical Data: Chest pain and shortness of breath.  CHEST - 2 VIEW  Comparison: 04/06/2011  Findings: The heart is enlarged but stable.  Stable surgical changes from bypass surgery.  The pacer wires / AICD are stable. There is mild vascular congestion but no overt pulmonary edema. Slightly low lung volumes with vascular crowding and atelectasis.  No pleural effusions.  The bony thorax is intact.  IMPRESSION: Cardiac enlargement and vascular congestion. Low lung volumes with vascular crowding and atelectasis.  Original Report Authenticated By: P. [name], M.D.      ASSESSMENT:  1.  NSTEMI 2.  Acute on chronic systolic HF improved 3.  Dual chamber AICD for upgrade in a few weeks by Dr. Johney Frame 4.  Severe ASCAD with redo SVG to Left circ occluded, SVG to PDA widely patent but mildly atretic and patent LIMA to LAD 5.  DM 6.  HTN  PLAN:   1.  Continue current meds 2.  D/C home today 3.  Followup with my NP in 1 week 4.  F/U with Dr. Johney Frame for AICD upgrade to [facility]  Quintella Reichert, MD  06/09/2011  9:52 AM

## 2011-06-09 NOTE — Progress Notes (Signed)
Nursing: DC IV, DC Tele, DC Home. Discharge instructions and home medications discussed with patient and patient's wife. Patient and wife denies any questions or concerns at this time. Patient leaving unit via wheelchair and appears in no acute distress.

## 2011-06-09 NOTE — Progress Notes (Signed)
Venogram reveals patent L subclavian vein  He will follow-up with me in office in 2-3 weeks and then if doing well, return for upgrade of his ICD to CRT-D electively  No further inpatient EP management planned Will see as needed while here.   Fayrene Fearing Daymion Nazaire,MD

## 2011-06-09 NOTE — Progress Notes (Signed)
   CARE MANAGEMENT NOTE HEART FAILURE  06/09/2011   Patient:  [name], [name]   Account Number:  [id number]    Date Initiated:  06/06/2011  Documentation initiated by:  Tera Mater  Subjective/Objective Assessment:   71yo male admitted with chest pain and SOB.  HX:  CHF, HTN, DM.  Pt. lives with spouse at home   Action/Plan:   Discharge planning for possible [facility] RN for HF management   Anticipated DC Date:  06/09/2011  Anticipated DC Plan:  HOME W HOME HEALTH SERVICES  DC Planning Services:  CM consult    Choice offered to / List presented to:          Status of service:  Completed, signed off  Medicare Important Message Given:  NA - LOS <3 / Initial given by admissions (If response is "NO", the following Medicare IM given date fields will be blank) Date Medicare IM Given:   Date Additional Medicare IM Given:    Discharge Disposition:  HOME/SELF CARE  Per UR Regulation:  Reviewed for med. necessity/level of care/duration of stay  Comments:   06/08/11 1545  Spoke with pt. about discharge planning.  Pt. and spouse stated that he did not need HH services at this time.  He obtains his medications without difficulty.  He states he weighs hisself everyday.  He also has the Living Better with Heart Failure.  Pt. will return within a couple of weeks to F/U with Dr. Johney Frame for AICD upgrade to BiV. Tera Mater, RN, BSN [phone]   06/06/11 1430 UR Completed. Tera Mater, RN, BSN [phone]   Initial CM contact:  06/08/2011 03:45 PM  By:  Tera Mater Initial CSW contact:     By:      Is this an INP Readmission < 30 days:  N (If "YES" please see readmission information at the bottom of note)  Patient living status prior to this admission:  FAMILY  Patient setting prior to this admission:  HOME  Comorbid conditions being treated that contributed to this admission:  CHF, HTN, DM  CHF Readmission Risk:  high  Type of patient education provided  HF Patient  Education Assessment / Teach Back  HF Zone Tool / Magnet  Limit salt intake  Weigh daily     Patient education provided by  [facility]    Was referral made to [facility]:  N  Is the patient's PCP the same as attending:  N PCP:    Readmission < 30 Days If pt has HH, did they contact the agency before going to the ED:   Name of [facility] agency:    Was the follow-up physician visit scheduled prior to discharge:    Did the patient follow-up with the physician prior to this readmission:    Was there HF Clinic visits prior to readmission:    Were there ED visits between admissions:    Readmit type:    If unscheduled and related indicate reason for readmit:

## 2011-06-11 NOTE — Discharge Summary (Signed)
Patient ID: [name] MRN: [id number] DOB/AGE: [birth year] 71 y.o.  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 Gertie Baron Course: This is a 71yo WM with a history of CAD s/p CABG in 199, PCI BMS to SVG to RCA 2009 and most recently redo CABG with SVG to PDA and SVG to left circ who was admitted with chest pain and ruled in for NSTEMI.  He was also found to have acute on chronic systolic heart failure and was diuresed.  He underwent cath revealing severe 3 vessel ASCAD with chronically occluded SVG to diagonal, chronically occluded SVG to OM,  high grade distal stenosis of SVG to RCA (old and not amenable to PCI), SVG to PDA patent but mildly atretic, occluded SVG to left circumflex, patent LIMA to LAD.  He is not considered a redo CABG canddtate and there was nothing amenable to PCI.   He was started on Imdur, amlodipine was stopped and Benazepril was increased.  He was also started on Plavix.  He did well post cath and CHF resolved.  On the day of discharge he was ambulating without SOB or chest pain.  Of note an EP consult was obtained and the patient will be set up for outpatient BiV AICD upgrade in a few weeks.  An upper extremity venogram was obtained during hospital stay to assess patentcy of left subclavian vein which was patent.   Discharge Exam: Blood pressure 124/76, pulse 71, temperature 97.3 F (36.3 C), temperature source Oral, resp. rate 19, height 5\' 8"  (1.727 m), weight 103.7 kg (228 lb 9.9 oz), SpO2 96.00%.    WD, WN WM in NAD HEENT:  Benign NECK:  Supple, no LAD, no bruit LUNG:  CTA bilaterally COR:  RRR with no M/R/G ABD:   Soft, NT, ND EXT:  No C/E/E Labs:   Lab Results  Component Value Date   WBC 10.3 06/05/2011   HGB 12.9* 06/05/2011   HCT 39.6 06/05/2011   MCV 84.1 06/05/2011   PLT 319 06/05/2011    Lab 06/08/11 0628 06/05/11 2023  NA 138 --  K 4.2 --  CL 102 --  CO2 23 --  BUN 21 --  CREATININE 1.16 --  CALCIUM 9.3 --  PROT -- 7.4  BILITOT -- 0.3  ALKPHOS -- 91  ALT -- 21  AST -- 34  GLUCOSE 98 --   Lab Results  Component Value Date   CKTOTAL 165 06/06/2011   CKMB 11.0* 06/06/2011   TROPONINI 2.21* 06/06/2011      Radiology:  *RADIOLOGY REPORT*  Clinical Data: Chest pain and shortness of breath.  CHEST - 2 VIEW  Comparison: 04/06/2011  Findings: The heart is enlarged but stable. Stable surgical  changes from bypass surgery. The pacer wires / AICD are stable.  There is mild vascular congestion but no overt pulmonary edema.  Slightly low lung volumes with vascular crowding and atelectasis.  No pleural effusions. The bony thorax is intact.  IMPRESSION:  Cardiac enlargement and vascular congestion.  Low lung volumes with vascular crowding and atelectasis.  Original Report Authenticated By: P. [name], M.D.  EKG:NSR with LBBB  and PVC's  FOLLOW UP PLANS AND APPOINTMENTS Discharge Orders    Future Appointments: Provider: Department: Dept Phone: Center:   07/12/2011 10:00 AM [facility] [phone] [facility]     Future Orders Please Complete By Expires   Diet - low sodium heart healthy      Increase activity slowly      (HEART FAILURE PATIENTS) Call MD:  Anytime you have any of the following symptoms: 1) 3 pound weight gain in 24 hours or 5 pounds in 1 week 2) shortness of breath, with or without a dry hacking cough 3) swelling in the hands, feet or stomach 4) if you have to sleep on extra pillows at night in order to breathe.      Call MD for:  extreme fatigue      Call MD for:  persistant dizziness or light-headedness      Call MD for:   difficulty breathing, headache or visual disturbances        Medication List  As of 06/11/2011  6:37 PM   STOP taking these medications         amLODipine-benazepril 5-20 MG per capsule         TAKE these medications         allopurinol 100 MG tablet   Commonly known as: ZYLOPRIM   Take 200 mg by mouth daily.      aspirin 81 MG tablet   Take 81 mg by mouth daily.      atorvastatin 40 MG tablet   Commonly known as: LIPITOR   Take 40 mg by mouth daily.      benazepril 40 MG tablet   Commonly known as: LOTENSIN   Take 1 tablet (40 mg total) by mouth daily.      carvedilol 25 MG tablet   Commonly known as: COREG   Take 25 mg by mouth 2 (two) times daily.      clopidogrel 75 MG tablet   Commonly known as: PLAVIX   Take 1 tablet (75 mg total) by mouth daily with breakfast.      eplerenone 25 MG tablet   Commonly known as: INSPRA   Take 25 mg by mouth daily.      furosemide 40 MG tablet   Commonly known as: LASIX   Take 1 tablet (40 mg total) by mouth 2 (two) times daily.      insulin glargine 100 UNIT/ML injection   Commonly known as: LANTUS   Inject 65 Units into the skin at bedtime.      insulin glulisine 100 UNIT/ML injection   Commonly known as: APIDRA   Inject 30 Units into the skin 3 (three) times daily before meals.      isosorbide mononitrate 30 MG 24 hr tablet   Commonly known as: IMDUR   Take 1 tablet (30 mg total) by mouth daily.      niacin 500 MG CR tablet   Commonly known as: NIASPAN   Take 1,000 mg by mouth 2 (two) times daily. Take 2 tablets by mouth two times a day      NITROSTAT 0.4 MG SL tablet   Generic drug: nitroGLYCERIN   Place 0.4 mg under the tongue every 5 (five) minutes as needed. For chest pain      potassium chloride SA 20 MEQ tablet   Commonly known as: K-DUR,KLOR-CON   Take 1 tablet (20 mEq total) by mouth daily.      triamcinolone cream 0.1 %  Commonly known as: KENALOG   Apply 1 application topically daily as needed.  Apply sparingly to affected area externally two times a day as needed for itching and rash      venlafaxine 150 MG 24 hr capsule   Commonly known as: EFFEXOR-XR   Take 150 mg by mouth daily.      zolpidem 12.5 MG CR tablet   Commonly known as: AMBIEN CR   Take 12.5 mg by mouth at bedtime as needed. For sleep           Follow-up Information    Follow up with FERGUSON,CYNTHIA A, NP on 06/22/2011. (9:10 am)    Contact information:   [facility] [address] [place] [address] [phone]          BRING ALL MEDICATIONS WITH YOU TO FOLLOW UP APPOINTMENTS  Time spent with patient to include physician time 45 minutes Signed: Quintella Reichert 06/11/2011, 6:37 PM

## 2011-06-12 ENCOUNTER — Telehealth: Payer: Self-pay | Admitting: Internal Medicine

## 2011-06-12 ENCOUNTER — Ambulatory Visit (HOSPITAL_COMMUNITY): Payer: Managed Care, Other (non HMO)

## 2011-06-12 ENCOUNTER — Encounter: Payer: Self-pay | Admitting: Internal Medicine

## 2011-06-12 DIAGNOSIS — Z79899 Other long term (current) drug therapy: Secondary | ICD-10-CM | POA: Diagnosis not present

## 2011-06-12 NOTE — Telephone Encounter (Signed)
Per staff message from dr allred, pt needs to see allred 2-3 weeks, phine d/c and no contacts are listed, sent pt letter to call to schedule/mt

## 2011-06-14 ENCOUNTER — Ambulatory Visit (HOSPITAL_COMMUNITY): Payer: Managed Care, Other (non HMO)

## 2011-06-14 DIAGNOSIS — F329 Major depressive disorder, single episode, unspecified: Secondary | ICD-10-CM | POA: Diagnosis not present

## 2011-06-14 DIAGNOSIS — I209 Angina pectoris, unspecified: Secondary | ICD-10-CM | POA: Diagnosis not present

## 2011-06-14 DIAGNOSIS — IMO0001 Reserved for inherently not codable concepts without codable children: Secondary | ICD-10-CM | POA: Diagnosis not present

## 2011-06-16 ENCOUNTER — Ambulatory Visit (HOSPITAL_COMMUNITY): Payer: Managed Care, Other (non HMO)

## 2011-06-16 DIAGNOSIS — I959 Hypotension, unspecified: Secondary | ICD-10-CM | POA: Diagnosis not present

## 2011-06-16 DIAGNOSIS — I5022 Chronic systolic (congestive) heart failure: Secondary | ICD-10-CM | POA: Diagnosis not present

## 2011-06-16 DIAGNOSIS — I2510 Atherosclerotic heart disease of native coronary artery without angina pectoris: Secondary | ICD-10-CM | POA: Diagnosis not present

## 2011-06-19 ENCOUNTER — Telehealth: Payer: Self-pay | Admitting: Internal Medicine

## 2011-06-19 ENCOUNTER — Ambulatory Visit (HOSPITAL_COMMUNITY): Payer: Managed Care, Other (non HMO)

## 2011-06-19 NOTE — Telephone Encounter (Signed)
Called and spoke with Lawson Radar and let her know Melissa from scheduling will be calling to schedule appointment for Mr Timson in 2-3 weeks

## 2011-06-19 NOTE — Telephone Encounter (Signed)
New msg Pt's wife called. She said wanted to talk to you about procedure that he is to have scheduled. Please call

## 2011-06-21 ENCOUNTER — Ambulatory Visit (HOSPITAL_COMMUNITY): Payer: Managed Care, Other (non HMO)

## 2011-06-23 ENCOUNTER — Ambulatory Visit (HOSPITAL_COMMUNITY): Payer: Managed Care, Other (non HMO)

## 2011-06-26 ENCOUNTER — Ambulatory Visit (HOSPITAL_COMMUNITY): Payer: Managed Care, Other (non HMO)

## 2011-06-28 ENCOUNTER — Ambulatory Visit (HOSPITAL_COMMUNITY): Payer: Managed Care, Other (non HMO)

## 2011-06-28 DIAGNOSIS — I2589 Other forms of chronic ischemic heart disease: Secondary | ICD-10-CM | POA: Diagnosis not present

## 2011-06-28 DIAGNOSIS — I2510 Atherosclerotic heart disease of native coronary artery without angina pectoris: Secondary | ICD-10-CM | POA: Diagnosis not present

## 2011-06-28 DIAGNOSIS — E782 Mixed hyperlipidemia: Secondary | ICD-10-CM | POA: Diagnosis not present

## 2011-06-28 DIAGNOSIS — I10 Essential (primary) hypertension: Secondary | ICD-10-CM | POA: Diagnosis not present

## 2011-06-28 DIAGNOSIS — I5022 Chronic systolic (congestive) heart failure: Secondary | ICD-10-CM | POA: Diagnosis not present

## 2011-06-30 ENCOUNTER — Ambulatory Visit (HOSPITAL_COMMUNITY): Payer: Managed Care, Other (non HMO)

## 2011-07-03 ENCOUNTER — Ambulatory Visit (HOSPITAL_COMMUNITY): Payer: Managed Care, Other (non HMO)

## 2011-07-05 ENCOUNTER — Ambulatory Visit (INDEPENDENT_AMBULATORY_CARE_PROVIDER_SITE_OTHER): Payer: Managed Care, Other (non HMO) | Admitting: Internal Medicine

## 2011-07-05 ENCOUNTER — Encounter: Payer: Self-pay | Admitting: Internal Medicine

## 2011-07-05 ENCOUNTER — Ambulatory Visit (HOSPITAL_COMMUNITY): Payer: Managed Care, Other (non HMO)

## 2011-07-05 ENCOUNTER — Encounter: Payer: Self-pay | Admitting: *Deleted

## 2011-07-05 VITALS — BP 115/71 | HR 96 | Resp 18 | Ht 69.0 in | Wt 228.8 lb

## 2011-07-05 DIAGNOSIS — I509 Heart failure, unspecified: Secondary | ICD-10-CM

## 2011-07-05 DIAGNOSIS — I10 Essential (primary) hypertension: Secondary | ICD-10-CM

## 2011-07-05 DIAGNOSIS — I519 Heart disease, unspecified: Secondary | ICD-10-CM

## 2011-07-05 DIAGNOSIS — I255 Ischemic cardiomyopathy: Secondary | ICD-10-CM

## 2011-07-05 DIAGNOSIS — I2589 Other forms of chronic ischemic heart disease: Secondary | ICD-10-CM | POA: Diagnosis not present

## 2011-07-05 LAB — ICD DEVICE OBSERVATION
ATRIAL PACING ICD: 0.01 pct
BAMS-0001: 170 {beats}/min
CHARGE TIME: 3.963 s
FVT: 0
PACEART VT: 0
RV LEAD AMPLITUDE: 30.625 mv
TOT-0001: 1
TOT-0002: 1
TZAT-0001ATACH: 2
TZAT-0001FASTVT: 1
TZAT-0002ATACH: NEGATIVE
TZAT-0002FASTVT: NEGATIVE
TZAT-0004SLOWVT: 8
TZAT-0005SLOWVT: 88 pct
TZAT-0012ATACH: 150 ms
TZAT-0012SLOWVT: 200 ms
TZAT-0013SLOWVT: 3
TZAT-0018FASTVT: NEGATIVE
TZAT-0018SLOWVT: NEGATIVE
TZAT-0019ATACH: 6 V
TZAT-0020ATACH: 1.5 ms
TZAT-0020ATACH: 1.5 ms
TZAT-0020ATACH: 1.5 ms
TZAT-0020SLOWVT: 1.5 ms
TZON-0003SLOWVT: 350 ms
TZON-0003VSLOWVT: 450 ms
TZON-0004SLOWVT: 16
TZON-0004VSLOWVT: 20
TZON-0005SLOWVT: 12
TZST-0001FASTVT: 2
TZST-0001FASTVT: 3
TZST-0001FASTVT: 4
TZST-0001FASTVT: 6
TZST-0001SLOWVT: 5
TZST-0002ATACH: NEGATIVE
TZST-0002FASTVT: NEGATIVE
TZST-0002FASTVT: NEGATIVE
TZST-0003SLOWVT: 25 J
TZST-0003SLOWVT: 35 J
VENTRICULAR PACING ICD: 0.06 pct

## 2011-07-05 NOTE — Assessment & Plan Note (Signed)
The patient has an ischemic CM, NYHA Class III CHF, and LBBB.  He is > 90 days post revascularization and on a good medical regimen.  He had a NSTEMI without revascularization 1 month ago.  He is expected to receive benefit from CRT therapy given LBBB.  I would therefore plan to proceed with device upgrade in the next few weeks.   Risks, benefits, alternatives to upgrade to a BiV ICD were discussed in detail with the patient today. The patient  understands that the risks include but are not limited to bleeding, infection, pneumothorax, perforation, tamponade, vascular damage, renal failure, MI, stroke, death, and lead dislodgement and wishes to proceed.  We will therefore schedule device upgrade at the next available time.

## 2011-07-05 NOTE — Patient Instructions (Signed)
Patient is going to have his ICD upgraded to a Bi-V ICD

## 2011-07-05 NOTE — Assessment & Plan Note (Signed)
Stable No change required today  

## 2011-07-05 NOTE — Assessment & Plan Note (Signed)
No ischemic symptoms Continue medical therapy 

## 2011-07-05 NOTE — Progress Notes (Signed)
PCP:  Kari Baars, MD, MD Primary Cardiologist:  Dr Mayford Knife  The patient presents today for electrophysiology followup after his recent hospitalization 2/13 for NSTEMI.  At that time, he reports significant decline in exercise tolerance since his last visit with me.  He has had progressive CHF.  As he has a LBBB it was felt that after optimization of medical therapy he would be a candidate for upgrade of his ICD to CRT-D.  I performed a venogram while in the hospital which revealed patent L axillary/ subclavian veins.  Since being discharged, the patient has done reasonably well.  His exercise tolerance is slightly improved.   He continues to have NYHA Class III CHF despite optimal medical therapy and waiting post MI.    Today, he denies symptoms of dizziness, presyncope, syncope, or neurologic sequela.  The patient feels that he is tolerating medications without difficulties and is otherwise without complaint today.   Past Medical History  Diagnosis Date  . Ischemic cardiomyopathy     s/p ICD Implantation by Dr Amil Amen  . Chronic systolic dysfunction of left ventricle     EF 30%  . CAD (coronary artery disease)     s/p CABG 1997, PCI (BMS) of SVG to RCA 3/09  . Hyperlipemia   . DJD (degenerative joint disease)   . Gout   . HTN (hypertension) 02/14/2011  . Depression   . Complication of anesthesia     ONCE WITH BACK SURGERY DIFFICULT TO WAKE UP  . CHF (congestive heart failure)   . Angina   . Myocardial infarction 1997  . Cardiac defibrillator in situ   . DM (diabetes mellitus) 02/14/2011  . Shortness of breath on exertion    Past Surgical History  Procedure Date  . Cardiac defibrillator placement 08/2004    initial placement  . Knee arthrotomy ~ 1978    RIGHT KNEE CARTILAGE REMOVED  . Coronary angioplasty with stent placement 06/2007    BMS to SVG to RCA  . Coronary angioplasty 02/2011  . Coronary artery bypass graft 01/1996    CABG x 5 LIMA to LAD SVG to diag1,2,svg to om,svg  to RCA  . Coronary artery bypass graft 03/06/2011    CABG X2; Procedure: REDO CORONARY ARTERY BYPASS GRAFTING (CABG);  Surgeon: Delight Ovens, MD;  Location: [facility] OR;  Service: Open Heart Surgery;  Laterality: N/A;  times two grafts using right saphenous vein harvested endoscopically.  . Cataract extraction w/ intraocular lens  implant, bilateral 2012  . Cardiac defibrillator placement 2009  . Lumbar disc surgery 2003  . Back surgery 2003    Current Outpatient Prescriptions  Medication Sig Dispense Refill  . allopurinol (ZYLOPRIM) 100 MG tablet Take 200 mg by mouth daily.       Marland Kitchen aspirin 81 MG tablet Take 81 mg by mouth daily.       Marland Kitchen atorvastatin (LIPITOR) 40 MG tablet Take 40 mg by mouth daily.       . benazepril (LOTENSIN) 40 MG tablet Take 1 tablet (40 mg total) by mouth daily.  30 tablet  11  . carvedilol (COREG) 25 MG tablet Take 25 mg by mouth 2 (two) times daily.       . clopidogrel (PLAVIX) 75 MG tablet Take 1 tablet (75 mg total) by mouth daily with breakfast.  30 tablet  11  . eplerenone (INSPRA) 25 MG tablet Take 25 mg by mouth daily.       . furosemide (LASIX) 40 MG tablet Take 1  tablet (40 mg total) by mouth 2 (two) times daily.  60 tablet  11  . insulin glargine (LANTUS) 100 UNIT/ML injection Inject 65 Units into the skin at bedtime.       . insulin glulisine (APIDRA) 100 UNIT/ML injection Inject 30 Units into the skin 3 (three) times daily before meals.       . isosorbide mononitrate (IMDUR) 30 MG 24 hr tablet Take 1 tablet (30 mg total) by mouth daily.  30 tablet  11  . niacin (NIASPAN) 500 MG CR tablet Take 1,000 mg by mouth 2 (two) times daily. Take 2 tablets by mouth two times a day      . NITROSTAT 0.4 MG SL tablet Place 0.4 mg under the tongue every 5 (five) minutes as needed. For chest pain      . potassium chloride SA (K-DUR,KLOR-CON) 20 MEQ tablet Take 1 tablet (20 mEq total) by mouth daily.  30 tablet  11  . triamcinolone (KENALOG) 0.1 % cream Apply 1 application  topically daily as needed. Apply sparingly to affected area externally two times a day as needed for itching and rash      . venlafaxine (EFFEXOR-XR) 150 MG 24 hr capsule Take 150 mg by mouth daily.        Marland Kitchen zolpidem (AMBIEN CR) 12.5 MG CR tablet Take 12.5 mg by mouth at bedtime as needed. For sleep        Allergies  Allergen Reactions  . Codeine Nausea And Vomiting  . Adhesive (Tape) Rash  . Latex Rash    Specifies adhesive     History   Social History  . Marital Status: Married    Spouse Name: N/A    Number of Children: N/A  . Years of Education: N/A   Occupational History  . Not on file.   Social History Main Topics  . Smoking status: Former Smoker -- 0.5 packs/day for 15 years    Types: Cigarettes    Quit date: 04/17/1969  . Smokeless tobacco: Never Used  . Alcohol Use: 0.6 oz/week    1 Cans of beer per week  . Drug Use: No  . Sexually Active: No   Other Topics Concern  . Not on file   Social History Narrative   Lives in [place], retired Production designer, theatre/television/film for [employer].  He collects antique metal toys    Family History  Problem Relation Age of Onset  . Coronary artery disease      ROS-  All systems are reviewed and are negative except as outlined in the HPI above   Physical Exam: Filed Vitals:   07/05/11 1339  BP: 115/71  Pulse: 96  Resp: 18  Height: 5\' 9"  (1.753 m)  Weight: 228 lb 12.8 oz (103.783 kg)    GEN- The patient is well appearing, alert and oriented x 3 today.   Head- normocephalic, atraumatic Eyes-  Sclera clear, conjunctiva pink Ears- hearing intact Oropharynx- clear Neck- supple, no JVP Lymph- no cervical lymphadenopathy Lungs- Clear to ausculation bilaterally, normal work of breathing Heart- Regular rate and rhythm, no murmurs, rubs or gallops, PMI not laterally displaced GI- soft, NT, ND, + BS Extremities- no clubbing, cyanosis, or edema MS- no significant deformity or atrophy Skin- no rash or lesion Psych- euthymic  mood, full affect Neuro- strength and sensation are intact ICD pocket is well healed  Assessment and Plan:

## 2011-07-06 MED FILL — Perflutren Lipid Microsphere IV Susp 6.52 MG/ML: INTRAVENOUS | Qty: 2 | Status: AC

## 2011-07-07 ENCOUNTER — Ambulatory Visit (HOSPITAL_COMMUNITY): Payer: Managed Care, Other (non HMO)

## 2011-07-10 ENCOUNTER — Ambulatory Visit (HOSPITAL_COMMUNITY): Payer: Managed Care, Other (non HMO)

## 2011-07-12 ENCOUNTER — Ambulatory Visit (HOSPITAL_COMMUNITY): Payer: Managed Care, Other (non HMO)

## 2011-07-12 ENCOUNTER — Other Ambulatory Visit: Payer: Self-pay | Admitting: *Deleted

## 2011-07-12 DIAGNOSIS — I509 Heart failure, unspecified: Secondary | ICD-10-CM

## 2011-07-12 DIAGNOSIS — I255 Ischemic cardiomyopathy: Secondary | ICD-10-CM

## 2011-07-14 ENCOUNTER — Ambulatory Visit (HOSPITAL_COMMUNITY): Payer: Managed Care, Other (non HMO)

## 2011-07-17 ENCOUNTER — Ambulatory Visit (HOSPITAL_COMMUNITY): Payer: Managed Care, Other (non HMO)

## 2011-07-19 ENCOUNTER — Ambulatory Visit (HOSPITAL_COMMUNITY): Payer: Managed Care, Other (non HMO)

## 2011-07-21 ENCOUNTER — Ambulatory Visit (HOSPITAL_COMMUNITY): Payer: Managed Care, Other (non HMO)

## 2011-07-21 ENCOUNTER — Other Ambulatory Visit (INDEPENDENT_AMBULATORY_CARE_PROVIDER_SITE_OTHER): Payer: Managed Care, Other (non HMO)

## 2011-07-21 DIAGNOSIS — I509 Heart failure, unspecified: Secondary | ICD-10-CM

## 2011-07-21 DIAGNOSIS — I2589 Other forms of chronic ischemic heart disease: Secondary | ICD-10-CM

## 2011-07-21 DIAGNOSIS — I255 Ischemic cardiomyopathy: Secondary | ICD-10-CM

## 2011-07-21 LAB — CBC WITH DIFFERENTIAL/PLATELET
Basophils Relative: 0.3 % (ref 0.0–3.0)
Eosinophils Relative: 1.5 % (ref 0.0–5.0)
HCT: 43 % (ref 39.0–52.0)
Hemoglobin: 13.9 g/dL (ref 13.0–17.0)
Lymphs Abs: 1.4 10*3/uL (ref 0.7–4.0)
MCV: 86.4 fl (ref 78.0–100.0)
Monocytes Absolute: 0.9 10*3/uL (ref 0.1–1.0)
Monocytes Relative: 10.5 % (ref 3.0–12.0)
Neutro Abs: 6.2 10*3/uL (ref 1.4–7.7)
Platelets: 257 10*3/uL (ref 150.0–400.0)
WBC: 8.6 10*3/uL (ref 4.5–10.5)

## 2011-07-21 LAB — BASIC METABOLIC PANEL
BUN: 26 mg/dL — ABNORMAL HIGH (ref 6–23)
Chloride: 98 mEq/L (ref 96–112)
Potassium: 5 mEq/L (ref 3.5–5.1)
Sodium: 135 mEq/L (ref 135–145)

## 2011-07-24 ENCOUNTER — Ambulatory Visit (HOSPITAL_COMMUNITY): Payer: Managed Care, Other (non HMO)

## 2011-07-26 ENCOUNTER — Ambulatory Visit (HOSPITAL_COMMUNITY): Payer: Managed Care, Other (non HMO)

## 2011-07-26 DIAGNOSIS — IMO0001 Reserved for inherently not codable concepts without codable children: Secondary | ICD-10-CM | POA: Diagnosis not present

## 2011-07-26 DIAGNOSIS — I10 Essential (primary) hypertension: Secondary | ICD-10-CM | POA: Diagnosis not present

## 2011-07-26 DIAGNOSIS — N289 Disorder of kidney and ureter, unspecified: Secondary | ICD-10-CM | POA: Diagnosis not present

## 2011-07-27 ENCOUNTER — Telehealth: Payer: Self-pay | Admitting: Physician Assistant

## 2011-07-27 ENCOUNTER — Ambulatory Visit (HOSPITAL_COMMUNITY)
Admission: RE | Admit: 2011-07-27 | Discharge: 2011-07-27 | Disposition: A | Discharge status: Home / Self Care | Payer: Managed Care, Other (non HMO) | Source: Ambulatory Visit | Attending: Internal Medicine | Admitting: Internal Medicine

## 2011-07-27 ENCOUNTER — Encounter (HOSPITAL_COMMUNITY): Payer: Self-pay | Admitting: Pharmacy Technician

## 2011-07-27 DIAGNOSIS — I509 Heart failure, unspecified: Secondary | ICD-10-CM

## 2011-07-27 DIAGNOSIS — I2589 Other forms of chronic ischemic heart disease: Secondary | ICD-10-CM | POA: Insufficient documentation

## 2011-07-27 DIAGNOSIS — Z5309 Procedure and treatment not carried out because of other contraindication: Secondary | ICD-10-CM | POA: Insufficient documentation

## 2011-07-27 DIAGNOSIS — I255 Ischemic cardiomyopathy: Secondary | ICD-10-CM

## 2011-07-27 DIAGNOSIS — R944 Abnormal results of kidney function studies: Secondary | ICD-10-CM | POA: Insufficient documentation

## 2011-07-27 MED ORDER — CEFAZOLIN SODIUM-DEXTROSE 2-3 GM-% IV SOLR
2.0000 g | INTRAVENOUS | Status: DC
  Filled 2011-07-27: qty 50

## 2011-07-27 MED ORDER — SODIUM CHLORIDE 0.9 % IR SOLN
80.0000 mg | Status: DC
  Filled 2011-07-27: qty 2

## 2011-07-27 NOTE — Telephone Encounter (Addendum)
Pt is supposed to get a defib upgrade tomorrow. He had labs drawn by Dr Lucianne Muss today and his SCr was elevated at 3.6. His family MD is aware and took him off his benazepril and decreased his furosemide to 1/2 of 40 mg tab BID. Dr Clelia Croft also felt he should drink pedialyte or gatorade. He generally feels terrible, is weak and nauseated with poor po intake recently. He is supposed to be at the hospital at 10 am and wants to know what to do.   Spoke with Fawn Kirk - will cancel procedure, f/u with TT and primary MD. Reschedule procedure later. Called pt and made her aware.

## 2011-07-28 ENCOUNTER — Ambulatory Visit
Admission: RE | Admit: 2011-07-28 | Discharge: 2011-07-28 | Disposition: A | Discharge status: Home / Self Care | Payer: Managed Care, Other (non HMO) | Source: Ambulatory Visit | Attending: Nephrology | Admitting: Nephrology

## 2011-07-28 ENCOUNTER — Other Ambulatory Visit: Payer: Self-pay | Admitting: Nephrology

## 2011-07-28 ENCOUNTER — Ambulatory Visit (HOSPITAL_COMMUNITY): Payer: Managed Care, Other (non HMO)

## 2011-07-28 ENCOUNTER — Encounter (HOSPITAL_COMMUNITY)
Admission: RE | Disposition: A | Discharge status: Home / Self Care | Payer: Self-pay | Source: Ambulatory Visit | Attending: Internal Medicine

## 2011-07-28 DIAGNOSIS — N179 Acute kidney failure, unspecified: Secondary | ICD-10-CM | POA: Diagnosis not present

## 2011-07-28 DIAGNOSIS — I10 Essential (primary) hypertension: Secondary | ICD-10-CM | POA: Diagnosis not present

## 2011-07-28 DIAGNOSIS — I2510 Atherosclerotic heart disease of native coronary artery without angina pectoris: Secondary | ICD-10-CM | POA: Diagnosis not present

## 2011-07-28 DIAGNOSIS — E119 Type 2 diabetes mellitus without complications: Secondary | ICD-10-CM | POA: Diagnosis not present

## 2011-07-28 SURGERY — BI-VENTRICULAR IMPLANTABLE CARDIOVERTER DEFIBRILLATOR UPGRADE
Anesthesia: LOCAL

## 2011-07-31 ENCOUNTER — Other Ambulatory Visit: Payer: Self-pay | Admitting: Cardiology

## 2011-07-31 ENCOUNTER — Ambulatory Visit (HOSPITAL_COMMUNITY): Payer: Managed Care, Other (non HMO)

## 2011-07-31 DIAGNOSIS — N179 Acute kidney failure, unspecified: Secondary | ICD-10-CM

## 2011-07-31 DIAGNOSIS — I10 Essential (primary) hypertension: Secondary | ICD-10-CM | POA: Diagnosis not present

## 2011-07-31 DIAGNOSIS — N289 Disorder of kidney and ureter, unspecified: Secondary | ICD-10-CM | POA: Diagnosis not present

## 2011-07-31 DIAGNOSIS — IMO0001 Reserved for inherently not codable concepts without codable children: Secondary | ICD-10-CM | POA: Diagnosis not present

## 2011-08-02 ENCOUNTER — Ambulatory Visit (HOSPITAL_COMMUNITY): Payer: Managed Care, Other (non HMO)

## 2011-08-04 ENCOUNTER — Ambulatory Visit (HOSPITAL_COMMUNITY): Payer: Managed Care, Other (non HMO)

## 2011-08-04 ENCOUNTER — Encounter (INDEPENDENT_AMBULATORY_CARE_PROVIDER_SITE_OTHER): Payer: Managed Care, Other (non HMO)

## 2011-08-04 DIAGNOSIS — N179 Acute kidney failure, unspecified: Secondary | ICD-10-CM

## 2011-08-04 DIAGNOSIS — N189 Chronic kidney disease, unspecified: Secondary | ICD-10-CM | POA: Diagnosis not present

## 2011-08-07 ENCOUNTER — Ambulatory Visit (HOSPITAL_COMMUNITY): Payer: Managed Care, Other (non HMO)

## 2011-08-07 DIAGNOSIS — N289 Disorder of kidney and ureter, unspecified: Secondary | ICD-10-CM | POA: Diagnosis not present

## 2011-08-09 ENCOUNTER — Ambulatory Visit (HOSPITAL_COMMUNITY): Payer: Managed Care, Other (non HMO)

## 2011-08-10 ENCOUNTER — Telehealth: Payer: Self-pay | Admitting: Internal Medicine

## 2011-08-10 NOTE — Telephone Encounter (Signed)
New Problem:     Patient would like to schedule her husabnd's Defib Change procedure.  Please call back.

## 2011-08-10 NOTE — Telephone Encounter (Signed)
To see in office first per Dr Alvia Grove calling patient to set up

## 2011-08-11 ENCOUNTER — Ambulatory Visit (HOSPITAL_COMMUNITY): Payer: Managed Care, Other (non HMO)

## 2011-08-14 ENCOUNTER — Ambulatory Visit (HOSPITAL_COMMUNITY): Payer: Managed Care, Other (non HMO)

## 2011-08-14 DIAGNOSIS — N289 Disorder of kidney and ureter, unspecified: Secondary | ICD-10-CM | POA: Diagnosis not present

## 2011-08-16 ENCOUNTER — Ambulatory Visit (HOSPITAL_COMMUNITY): Payer: Managed Care, Other (non HMO)

## 2011-08-18 ENCOUNTER — Ambulatory Visit (HOSPITAL_COMMUNITY): Payer: Managed Care, Other (non HMO)

## 2011-08-21 ENCOUNTER — Inpatient Hospital Stay (HOSPITAL_COMMUNITY)
Admission: AD | Admit: 2011-08-21 | Discharge: 2011-08-25 | DRG: 244 | Disposition: A | Discharge status: Home / Self Care | Payer: Managed Care, Other (non HMO) | Source: Ambulatory Visit | Attending: Cardiology | Admitting: Cardiology

## 2011-08-21 ENCOUNTER — Other Ambulatory Visit: Payer: Self-pay | Admitting: Cardiology

## 2011-08-21 ENCOUNTER — Encounter: Payer: Self-pay | Admitting: Cardiology

## 2011-08-21 ENCOUNTER — Encounter (HOSPITAL_COMMUNITY): Payer: Self-pay | Admitting: General Practice

## 2011-08-21 DIAGNOSIS — I10 Essential (primary) hypertension: Secondary | ICD-10-CM | POA: Diagnosis present

## 2011-08-21 DIAGNOSIS — F3289 Other specified depressive episodes: Secondary | ICD-10-CM | POA: Diagnosis present

## 2011-08-21 DIAGNOSIS — M109 Gout, unspecified: Secondary | ICD-10-CM | POA: Diagnosis present

## 2011-08-21 DIAGNOSIS — Z91040 Latex allergy status: Secondary | ICD-10-CM | POA: Diagnosis not present

## 2011-08-21 DIAGNOSIS — Z87891 Personal history of nicotine dependence: Secondary | ICD-10-CM

## 2011-08-21 DIAGNOSIS — I447 Left bundle-branch block, unspecified: Secondary | ICD-10-CM | POA: Diagnosis present

## 2011-08-21 DIAGNOSIS — F329 Major depressive disorder, single episode, unspecified: Secondary | ICD-10-CM | POA: Diagnosis present

## 2011-08-21 DIAGNOSIS — Z79899 Other long term (current) drug therapy: Secondary | ICD-10-CM

## 2011-08-21 DIAGNOSIS — I5023 Acute on chronic systolic (congestive) heart failure: Principal | ICD-10-CM | POA: Diagnosis present

## 2011-08-21 DIAGNOSIS — I509 Heart failure, unspecified: Secondary | ICD-10-CM | POA: Diagnosis not present

## 2011-08-21 DIAGNOSIS — I2510 Atherosclerotic heart disease of native coronary artery without angina pectoris: Secondary | ICD-10-CM | POA: Diagnosis present

## 2011-08-21 DIAGNOSIS — Z95810 Presence of automatic (implantable) cardiac defibrillator: Secondary | ICD-10-CM | POA: Diagnosis not present

## 2011-08-21 DIAGNOSIS — E162 Hypoglycemia, unspecified: Secondary | ICD-10-CM | POA: Diagnosis present

## 2011-08-21 DIAGNOSIS — I519 Heart disease, unspecified: Secondary | ICD-10-CM | POA: Insufficient documentation

## 2011-08-21 DIAGNOSIS — Z7902 Long term (current) use of antithrombotics/antiplatelets: Secondary | ICD-10-CM

## 2011-08-21 DIAGNOSIS — E119 Type 2 diabetes mellitus without complications: Secondary | ICD-10-CM | POA: Diagnosis present

## 2011-08-21 DIAGNOSIS — Z885 Allergy status to narcotic agent status: Secondary | ICD-10-CM | POA: Diagnosis not present

## 2011-08-21 DIAGNOSIS — E785 Hyperlipidemia, unspecified: Secondary | ICD-10-CM | POA: Diagnosis present

## 2011-08-21 DIAGNOSIS — E7800 Pure hypercholesterolemia, unspecified: Secondary | ICD-10-CM | POA: Diagnosis not present

## 2011-08-21 DIAGNOSIS — E1165 Type 2 diabetes mellitus with hyperglycemia: Secondary | ICD-10-CM | POA: Diagnosis not present

## 2011-08-21 DIAGNOSIS — K219 Gastro-esophageal reflux disease without esophagitis: Secondary | ICD-10-CM | POA: Diagnosis present

## 2011-08-21 DIAGNOSIS — I5022 Chronic systolic (congestive) heart failure: Secondary | ICD-10-CM | POA: Diagnosis not present

## 2011-08-21 DIAGNOSIS — I2589 Other forms of chronic ischemic heart disease: Secondary | ICD-10-CM | POA: Diagnosis not present

## 2011-08-21 DIAGNOSIS — Z951 Presence of aortocoronary bypass graft: Secondary | ICD-10-CM

## 2011-08-21 DIAGNOSIS — I252 Old myocardial infarction: Secondary | ICD-10-CM | POA: Diagnosis not present

## 2011-08-21 DIAGNOSIS — IMO0002 Reserved for concepts with insufficient information to code with codable children: Secondary | ICD-10-CM | POA: Diagnosis not present

## 2011-08-21 HISTORY — DX: Gastro-esophageal reflux disease without esophagitis: K21.9

## 2011-08-21 LAB — PRO B NATRIURETIC PEPTIDE: Pro B Natriuretic peptide (BNP): 3265 pg/mL — ABNORMAL HIGH (ref 0–125)

## 2011-08-21 MED ORDER — SPIRONOLACTONE 25 MG PO TABS: 25.0000 mg | ORAL_TABLET | Freq: Every day | ORAL | Status: DC

## 2011-08-21 MED ORDER — ATORVASTATIN CALCIUM 40 MG PO TABS
40.0000 mg | ORAL_TABLET | Freq: Every day | ORAL | Status: DC
  Administered 2011-08-21 – 2011-08-25 (×4): 40 mg via ORAL
  Filled 2011-08-21 (×4): qty 1

## 2011-08-21 MED ORDER — NIACIN ER (ANTIHYPERLIPIDEMIC) 500 MG PO TBCR
1000.0000 mg | EXTENDED_RELEASE_TABLET | Freq: Two times a day (BID) | ORAL | Status: DC
  Administered 2011-08-21 – 2011-08-22 (×2): 1000 mg via ORAL
  Filled 2011-08-21 (×3): qty 2

## 2011-08-21 MED ORDER — ASPIRIN 81 MG PO TABS: 81.0000 mg | ORAL_TABLET | Freq: Every day | ORAL | Status: DC

## 2011-08-21 MED ORDER — INSULIN GLARGINE 100 UNIT/ML ~~LOC~~ SOLN
35.0000 [IU] | Freq: Two times a day (BID) | SUBCUTANEOUS | Status: DC
  Administered 2011-08-21 – 2011-08-24 (×5): 35 [IU] via SUBCUTANEOUS

## 2011-08-21 MED ORDER — NITROGLYCERIN 0.4 MG SL SUBL
SUBLINGUAL_TABLET | SUBLINGUAL | Status: AC
  Administered 2011-08-21: 18:00:00
  Filled 2011-08-21: qty 25

## 2011-08-21 MED ORDER — ASPIRIN EC 81 MG PO TBEC
81.0000 mg | DELAYED_RELEASE_TABLET | Freq: Every day | ORAL | Status: DC
  Administered 2011-08-21 – 2011-08-25 (×4): 81 mg via ORAL
  Filled 2011-08-21 (×4): qty 1

## 2011-08-21 MED ORDER — NITROGLYCERIN 0.4 MG SL SUBL: 0.4000 mg | SUBLINGUAL_TABLET | SUBLINGUAL | Status: DC | PRN

## 2011-08-21 MED ORDER — SODIUM CHLORIDE 0.9 % IV SOLN
INTRAVENOUS | Status: DC
  Administered 2011-08-22 – 2011-08-23 (×2): via INTRAVENOUS

## 2011-08-21 MED ORDER — ALLOPURINOL 100 MG PO TABS
200.0000 mg | ORAL_TABLET | Freq: Every day | ORAL | Status: DC
  Administered 2011-08-21 – 2011-08-25 (×4): 200 mg via ORAL
  Filled 2011-08-21 (×4): qty 2

## 2011-08-21 MED ORDER — NITROGLYCERIN 0.4 MG SL SUBL
0.4000 mg | SUBLINGUAL_TABLET | SUBLINGUAL | Status: DC | PRN
  Administered 2011-08-21 – 2011-08-22 (×3): 0.4 mg via SUBLINGUAL
  Filled 2011-08-21: qty 25

## 2011-08-21 MED ORDER — RANOLAZINE ER 500 MG PO TB12
1000.0000 mg | ORAL_TABLET | Freq: Two times a day (BID) | ORAL | Status: DC
  Administered 2011-08-21 – 2011-08-25 (×7): 1000 mg via ORAL
  Filled 2011-08-21 (×10): qty 2

## 2011-08-21 MED ORDER — VENLAFAXINE HCL ER 150 MG PO CP24
150.0000 mg | ORAL_CAPSULE | Freq: Every day | ORAL | Status: DC
  Administered 2011-08-21 – 2011-08-25 (×4): 150 mg via ORAL
  Filled 2011-08-21 (×5): qty 1

## 2011-08-21 MED ORDER — CLOPIDOGREL BISULFATE 75 MG PO TABS
75.0000 mg | ORAL_TABLET | Freq: Every day | ORAL | Status: DC
  Administered 2011-08-22 – 2011-08-23 (×2): 75 mg via ORAL
  Filled 2011-08-21 (×3): qty 1

## 2011-08-21 MED ORDER — ZOLPIDEM TARTRATE 5 MG PO TABS
10.0000 mg | ORAL_TABLET | Freq: Every evening | ORAL | Status: DC | PRN
  Administered 2011-08-21 – 2011-08-24 (×3): 10 mg via ORAL
  Filled 2011-08-21 (×3): qty 1
  Filled 2011-08-21 (×2): qty 2

## 2011-08-21 MED ORDER — INSULIN ASPART 100 UNIT/ML ~~LOC~~ SOLN: 30.0000 [IU] | Freq: Every day | SUBCUTANEOUS | Status: DC

## 2011-08-21 MED ORDER — CARVEDILOL 25 MG PO TABS
25.0000 mg | ORAL_TABLET | Freq: Two times a day (BID) | ORAL | Status: DC
Start: 2011-08-21 — End: 2011-08-25
  Administered 2011-08-21 – 2011-08-25 (×7): 25 mg via ORAL
  Filled 2011-08-21 (×7): qty 1

## 2011-08-21 MED ORDER — INSULIN GLULISINE 100 UNIT/ML ~~LOC~~ SOLN: 30.0000 [IU] | Freq: Three times a day (TID) | SUBCUTANEOUS | Status: DC

## 2011-08-21 MED ORDER — INSULIN ASPART 100 UNIT/ML ~~LOC~~ SOLN
35.0000 [IU] | SUBCUTANEOUS | Status: DC
  Administered 2011-08-22 – 2011-08-23 (×2): 35 [IU] via SUBCUTANEOUS

## 2011-08-21 NOTE — Progress Notes (Signed)
1800 chest pain free with stable vital signs after 2 nitro sl  0.4 mgs/tab as charted

## 2011-08-21 NOTE — H&P (Signed)
Office Visit     Patient: [name], [name] Provider: Armanda Magic, MD  DOB: [birth year] Age: 71 Y Sex: Male Date: 08/21/2011  Phone: [phone]   Address: [address], [place], [id number]  Pcp: Andria Meuse SHAW       Subjective:     CC:    1. NOT FEELING WELL.        HPI:  General:  Mr Berti is a 71 yo male with hx of CAD with CABG and recent redo CABG. He also has a hx of HTN, hyperlipidemia and IDCM. He was admitted 06/05/11 due to recurrent chest pain with + NSTEMI along with acute CHF and was diuresed. Cardiac cath revealed severe CAD and not a candidate for further coronary surgery. Started on Imdur, Plavix, and Benazepril was increased for CHF therapy. His Imdur was stopped and he was started on Ranexa. His headaches resolved after stopping Imdur. Today he complians of increasing SOB and DOE. He has not really noticed any further LE edema than his baseline. He has been having chest pain as well and has had to take several NTG over the past several weeks. .        ROS:  See HPI, A twelve system review was perfomed at today's visit. For pertinent positives and negatives see HPI.       Medical History: diabetes mellitus - Dr Lucianne Muss, coronary artery disease, multivessel, s/p CABG 01/1996 with redo CABG 02/2011 with SVG to PDA and SVG to LCx, Hyperlipidemia, Osteoarthritis, Gout, ischemic cardiopathy, IP MI 01/1996, LVEF 35%, MUGA, 05/2009; 30% rest Cardiolite LV function, 10/2009, PCI/bare-metal stent implantation SVG-RCA 06/2007, s/p ICD, prophylactic, 08/2004, battery change and lead revision (4782), 01/2010, Chronic LBBB.        Family History:        Social History:  General:  History of smoking cigarettes: Former smoker, Quit in year Quit 30 years ago.  no Smoking.  Alcohol: yes, occasionally.  Caffeine: yes, 2.  no Diet.  Exercise: yes, Walking on treadmill (38min-40min) 3x week.  Marital Status: married, Gunnar Fusi.  Children: 1 son, 1 dtr, no grandkids.  Plants  4 acres of veggies, sells most of it.       Medications: Triamcinolone Acetonide 0.1 % Ointment 1 application sparingly to affected area Twice a day prn, Verio Gold Test Strips dx code 250.02 Test strips For use when checking blood sugars twice a day, NovoFine 32G X 6 MM Miscellaneous USE NEEDLES TWICE DAILY AS DIRECTED , Niaspan 500 Tablet 2 tablets twice a day, Bayer Contour Next test strips . Marland Kitchen For use when checking blood sugars twice daily, Lantus SoloStar 100 UNIT/ML Solution 65 units at bedtime, Apidra SoloStar 3 ml pen Solution 30 units before meals three times a day, Venlafaxine HCl 150 MG Capsule Extended Release 24 Hour 1 capsule with food Once a day, Nitroglycerin SL 0.4 mg tablet 1 tablet as directed every 5 mins x 3; if no relief seek medical attention, Clopidogrel Bisulfate 75 MG Tablet 1 tablet Once a day, Aspirin 81 MG Tablet Chewable 1 tablet Once a day, Inspra 25 MG Tablet 1 tablet once a day, Carvedilol 25 Tablet TAKE ONE TABLET BY MOUTH TWICE DAILY , Lipitor 40 Tablet TAKE ONE TABLET BY MOUTH EVERY DAY , Ranexa 500 MG Tablet Extended Release 12 Hour 1 tablet Twice a day, Allopurinol 100 mg Tablet 2 tablets once a day, Furosemide 40 MG Tablet 1 tablet twice a day, Ambien CR 12.5 MG Tablet 1 tablet at  bedtime as needed for sleep, Medication List reviewed and reconciled with the patient       Allergies: Codeine (for allergy), Latex Exam Gloves.       Objective:     Vitals: Wt 229, Wt change 2.8 lb, Ht 67.25, BMI 35.60, Pulse sitting 96, BP sitting 128/82, Oxygen sat % 95.       Examination:  Cardiology, General:  GENERAL APPEARANCE: pleasant, NAD.  HEENT: unremarkable.  CAROTID UPSTROKE: normal, no bruit.  JVD: flat.  HEART SOUNDS: regular, normal S1, S2, no S3 or S4.  MURMUR: absent.  LUNGS: no rales or wheezes.  ABDOMEN: soft, non tender, positive bowel sounds, no masses felt.  EXTREMITIES: no leg edema.  PERIPHERAL PULSES: 2 plus bilateral.        Assessment:      Assessment:  1. Coronary atherosclerosis of native coronary artery - 414.01 (Primary)  2. Essential hypertension, benign - 401.1  3. Other specified forms of chronic ischemic heart disease - 414.8  4. Chronic systolic heart failure - 428.22  5. Automatic implantable cardiac defibrillator in situ - V45.02  6. Pure hypercholesterolemia - 272.0  7. Encounter for long-term (current) use of medications - V58.69  8. Acute on chronic systolic heart failure - 428.23    Plan:     1. Coronary atherosclerosis of native coronary artery Continue Aspirin Tablet Chewable, 81 MG, 1 tablet, Orally, Once a day ; Continue Clopidogrel Bisulfate Tablet, 75 MG, 1 tablet, Orally, Once a day ; Increase Ranexa Tablet Extended Release 12 Hour, 1000 MG, 1 tablet, Orally, Twice a day, 30 days, 60 .  Diagnostic Imaging:EKG NSR with Vpaced rhythm, Redmond Baseman 08/21/2011 02:51:30 PM > Corbett Moulder M 08/21/2011 02:53:57 PM >       2. Essential hypertension, benign Continue Carvedilol Tablet, 25, TAKE ONE TABLET BY MOUTH TWICE DAILY .       3. Other specified forms of chronic ischemic heart disease Continue Inspra Tablet, 25 MG, 1 tablet, once a day ; Continue Carvedilol Tablet, 25, TAKE ONE TABLET BY MOUTH TWICE DAILY ; Continue Furosemide Tablet, 40 MG, 1 tablet, Orally, twice a day .       4. Pure hypercholesterolemia Continue Niaspan Tablet, 500, 2 tablets, twice a day ; Continue Lipitor Tablet, 40, TAKE ONE TABLET BY MOUTH EVERY DAY .       5. Acute on chronic systolic heart failure  I have talked with Dr. Johney Frame in regards to timing of upgrade of AICD to BiV AICD. His optivol is significantly elevated today c/w increased volume overload. He cannot lie flat to examine. I have recommended that he be admitted to [facility] for IV diuretics and to optimize medical therapy in hopes of possible upgrade of AICD later this week.        Immunizations:        Labs:        Procedure Codes: 16109 EKG I AND R        Preventive:         Follow Up: Admit level 3 CHF      Provider: Armanda Magic, MD  Patient: [name], [name] DOB: [birth year] Date: 08/21/2011

## 2011-08-22 ENCOUNTER — Inpatient Hospital Stay (HOSPITAL_COMMUNITY): Payer: Managed Care, Other (non HMO)

## 2011-08-22 LAB — COMPREHENSIVE METABOLIC PANEL
ALT: 32 U/L (ref 0–53)
AST: 33 U/L (ref 0–37)
Alkaline Phosphatase: 110 U/L (ref 39–117)
BUN: 18 mg/dL (ref 6–23)
Chloride: 99 mEq/L (ref 96–112)
GFR calc Af Amer: 83 mL/min — ABNORMAL LOW (ref >90)
Total Protein: 7.5 g/dL (ref 6.0–8.3)

## 2011-08-22 LAB — GLUCOSE, CAPILLARY
Glucose-Capillary: 134 mg/dL — ABNORMAL HIGH (ref 70–99)
Glucose-Capillary: 129 mg/dL — ABNORMAL HIGH (ref 70–99)
Glucose-Capillary: 189 mg/dL — ABNORMAL HIGH (ref 70–99)
Glucose-Capillary: 81 mg/dL (ref 70–99)
Glucose-Capillary: 174 mg/dL — ABNORMAL HIGH (ref 70–99)

## 2011-08-22 LAB — CBC
HCT: 40.1 % (ref 39.0–52.0)
MCH: 27.7 pg (ref 26.0–34.0)
MCV: 86.1 fL (ref 78.0–100.0)
Platelets: 290 10*3/uL (ref 150–400)
RDW: 18.9 % — ABNORMAL HIGH (ref 11.5–15.5)
WBC: 7.6 10*3/uL (ref 4.0–10.5)

## 2011-08-22 LAB — CARDIAC PANEL(CRET KIN+CKTOT+MB+TROPI)
CK, MB: 8.3 ng/mL (ref 0.3–4.0)
CK, MB: 6 ng/mL — ABNORMAL HIGH (ref 0.3–4.0)
Total CK: 89 U/L (ref 7–232)
Troponin I: 0.53 ng/mL (ref <0.30)

## 2011-08-22 LAB — HEPARIN LEVEL (UNFRACTIONATED): Heparin Unfractionated: 0.25 IU/mL — ABNORMAL LOW (ref 0.30–0.70)

## 2011-08-22 LAB — MAGNESIUM: Magnesium: 2 mg/dL (ref 1.5–2.5)

## 2011-08-22 LAB — DIFFERENTIAL
Eosinophils Absolute: 0.1 10*3/uL (ref 0.0–0.7)
Eosinophils Relative: 2 % (ref 0–5)
Lymphs Abs: 1.3 10*3/uL (ref 0.7–4.0)
Monocytes Absolute: 1 10*3/uL (ref 0.1–1.0)

## 2011-08-22 MED ORDER — NITROGLYCERIN IN D5W 200-5 MCG/ML-% IV SOLN
INTRAVENOUS | Status: AC
  Administered 2011-08-22: 5 ug/min via INTRAVENOUS
  Filled 2011-08-22: qty 250

## 2011-08-22 MED ORDER — MORPHINE SULFATE 2 MG/ML IJ SOLN
2.0000 mg | Freq: Once | INTRAMUSCULAR | Status: AC
  Administered 2011-08-22: 2 mg via INTRAVENOUS
  Filled 2011-08-22: qty 1

## 2011-08-22 MED ORDER — ACETAMINOPHEN 325 MG PO TABS
650.0000 mg | ORAL_TABLET | ORAL | Status: DC | PRN
  Administered 2011-08-22 – 2011-08-23 (×3): 650 mg via ORAL
  Filled 2011-08-22 (×3): qty 2

## 2011-08-22 MED ORDER — SODIUM CHLORIDE 0.9 % IJ SOLN: 3.0000 mL | INTRAMUSCULAR | Status: DC | PRN

## 2011-08-22 MED ORDER — FUROSEMIDE 10 MG/ML IJ SOLN
80.0000 mg | Freq: Two times a day (BID) | INTRAMUSCULAR | Status: DC
  Administered 2011-08-22 – 2011-08-23 (×3): 80 mg via INTRAVENOUS
  Filled 2011-08-22 (×5): qty 8

## 2011-08-22 MED ORDER — HEPARIN SODIUM (PORCINE) 5000 UNIT/ML IJ SOLN
5000.0000 [IU] | Freq: Three times a day (TID) | INTRAMUSCULAR | Status: DC
  Administered 2011-08-22: 5000 [IU] via SUBCUTANEOUS
  Filled 2011-08-22 (×3): qty 1

## 2011-08-22 MED ORDER — SODIUM CHLORIDE 0.9 % IJ SOLN
3.0000 mL | Freq: Two times a day (BID) | INTRAMUSCULAR | Status: DC
  Administered 2011-08-22 (×2): 3 mL via INTRAVENOUS
  Administered 2011-08-23: 5 mL via INTRAVENOUS
  Administered 2011-08-23 – 2011-08-25 (×3): 3 mL via INTRAVENOUS

## 2011-08-22 MED ORDER — GLUCOSE-VITAMIN C 4-6 GM-MG PO CHEW
CHEWABLE_TABLET | ORAL | Status: AC
  Administered 2011-08-22: 1
  Filled 2011-08-22: qty 1

## 2011-08-22 MED ORDER — ASPIRIN EC 81 MG PO TBEC: 81.0000 mg | DELAYED_RELEASE_TABLET | Freq: Every day | ORAL | Status: DC

## 2011-08-22 MED ORDER — ONDANSETRON HCL 4 MG/2ML IJ SOLN: 4.0000 mg | Freq: Four times a day (QID) | INTRAMUSCULAR | Status: DC | PRN

## 2011-08-22 MED ORDER — CARVEDILOL 25 MG PO TABS: 25.0000 mg | ORAL_TABLET | Freq: Two times a day (BID) | ORAL | Status: DC

## 2011-08-22 MED ORDER — NIACIN ER 500 MG PO CPCR
1000.0000 mg | ORAL_CAPSULE | Freq: Two times a day (BID) | ORAL | Status: DC
  Administered 2011-08-22 – 2011-08-25 (×6): 1000 mg via ORAL
  Filled 2011-08-22 (×6): qty 2

## 2011-08-22 MED ORDER — HEPARIN (PORCINE) IN NACL 100-0.45 UNIT/ML-% IJ SOLN
1450.0000 [IU]/h | INTRAMUSCULAR | Status: DC
  Administered 2011-08-22: 1250 [IU]/h via INTRAVENOUS
  Administered 2011-08-23: 1450 [IU]/h via INTRAVENOUS
  Filled 2011-08-22 (×3): qty 250

## 2011-08-22 MED ORDER — GLUCOSE-VITAMIN C 4-6 GM-MG PO CHEW
CHEWABLE_TABLET | ORAL | Status: AC
  Administered 2011-08-22: 2
  Filled 2011-08-22: qty 1

## 2011-08-22 MED ORDER — SODIUM CHLORIDE 0.9 % IV SOLN: 250.0000 mL | INTRAVENOUS | Status: DC | PRN

## 2011-08-22 MED ORDER — NITROGLYCERIN IN D5W 200-5 MCG/ML-% IV SOLN
5.0000 ug/min | INTRAVENOUS | Status: DC
  Administered 2011-08-22: 5 ug/min via INTRAVENOUS

## 2011-08-22 NOTE — Progress Notes (Signed)
CBG: 42  Treatment: Glucose tabs X 2  Symptoms: diaphoresis  Follow-up CBG: Time:1700 CBG Result:81  Possible Reasons for Event: poor po intake after insulin  Comments/MD notified:Dr. Lynnea Ferrier, Kinnie Scales

## 2011-08-22 NOTE — Progress Notes (Signed)
ANTICOAGULATION CONSULT NOTE - Initial Consult  Pharmacy Consult for Heparin Indication: chest pain/ACS  Allergies  Allergen Reactions  . Codeine Nausea And Vomiting  . Latex Rash    Specifies adhesive     Patient Measurements: Height: 5\' 9"  (175.3 cm) Weight: 223 lb 11.2 oz (101.47 kg) (Scale C) IBW/kg (Calculated) : 70.7  Heparin Dosing Weight: 92.3  Vital Signs: Temp: 97.7 F (36.5 C) (05/07 1329) Temp src: Oral (05/07 1329) BP: 148/93 mmHg (05/07 1458) Pulse Rate: 81  (05/07 1458)  Labs:  Basename 08/22/11 1025  HGB 12.9*  HCT 40.1  PLT 290  APTT --  LABPROT 14.4  INR 1.10  HEPARINUNFRC --  CREATININE 1.04  CKTOTAL --  CKMB --  TROPONINI --    Estimated Creatinine Clearance: 79.8 ml/min (by C-G formula based on Cr of 1.04).   Medical History: Past Medical History  Diagnosis Date  . Ischemic cardiomyopathy     s/p ICD Implantation by Dr Amil Amen  . Chronic systolic dysfunction of left ventricle     EF 30%  . Hyperlipemia   . DJD (degenerative joint disease)   . Gout   . HTN (hypertension) 02/14/2011  . Depression   . Complication of anesthesia     ONCE WITH BACK SURGERY DIFFICULT TO WAKE UP  . Angina   . Myocardial infarction 1997  . Cardiac defibrillator in situ   . DM (diabetes mellitus) 02/14/2011  . Shortness of breath on exertion   . CAD (coronary artery disease)     s/p CABG 1997, PCI (BMS) of SVG to RCA 3/09, redo CABG 02/2011 with SVG to PDA, SVG to Lcx, s/p cath 2.2013 with ocluded SVT to left circ and patent SVG to RCA  . CHF (congestive heart failure)     EF 30% by cath 05/2011  . ICD (implantable cardiac defibrillator) in place   . GERD (gastroesophageal reflux disease)     Medications:  Prescriptions prior to admission  Medication Sig Dispense Refill  . allopurinol (ZYLOPRIM) 100 MG tablet Take 200 mg by mouth daily.       Marland Kitchen aspirin 81 MG tablet Take 81 mg by mouth daily.       Marland Kitchen atorvastatin (LIPITOR) 40 MG tablet Take 40 mg  by mouth daily.       . carvedilol (COREG) 25 MG tablet Take 25 mg by mouth 2 (two) times daily.       . clopidogrel (PLAVIX) 75 MG tablet Take 1 tablet (75 mg total) by mouth daily with breakfast.  30 tablet  11  . eplerenone (INSPRA) 25 MG tablet Take 25 mg by mouth daily.       . furosemide (LASIX) 40 MG tablet Take 1 tablet (40 mg total) by mouth 2 (two) times daily.  60 tablet  11  . insulin glargine (LANTUS) 100 UNIT/ML injection Inject 35 Units into the skin 2 (two) times daily.       . insulin glulisine (APIDRA) 100 UNIT/ML injection Inject 30-35 Units into the skin 3 (three) times daily before meals. 35 units at breakfast & lunch and 30 units at supper      . niacin (NIASPAN) 500 MG CR tablet Take 1,000 mg by mouth 2 (two) times daily. Take 2 tablets by mouth two times a day      . NITROSTAT 0.4 MG SL tablet Place 0.4 mg under the tongue every 5 (five) minutes as needed. For chest pain      . ranolazine (RANEXA)  500 MG 12 hr tablet Take 1,000 mg by mouth 2 (two) times daily.      Marland Kitchen triamcinolone (KENALOG) 0.1 % cream Apply 1 application topically daily as needed. Apply sparingly to affected area externally two times a day as needed for itching and rash      . venlafaxine (EFFEXOR-XR) 150 MG 24 hr capsule Take 150 mg by mouth daily.        Marland Kitchen zolpidem (AMBIEN CR) 12.5 MG CR tablet Take 12.5 mg by mouth at bedtime as needed. For sleep        Assessment: 70yom to start heparin for CP/ACS. Patient reports no bleeding and not taking any anticoagulants. Patient received heparin SQ 5000 units today ~1200. - Baseline INR: 1.1 - H/H and Plts wnl - Heparin dosing weight: 92.3kg - CrCl 80 ml/min  Goal of Therapy:  Heparin level 0.3-0.7 units/ml Monitor platelets by anticoagulation protocol: Yes   Plan:  1. Heparin drip 1250 units/hr (12.5 ml/hr). No bolus due to recent SQ heparin dose (~1150) 2. Check heparin level 6 hours after heparin initiation 3. Daily heparin level and CBC  Cleon Dew [phone] 08/22/2011,3:00 PM

## 2011-08-22 NOTE — Progress Notes (Signed)
1400 pt complained of chest pain across the anterior non radiating descrbed as pressure  To crushing pain . With level 8-9 with 10 as the highest . Awake    ,alert and oriented color slightly pale skin slightlyty diaphoretic,cool to touch. Pt placed on 02 2l/ ekg done . Emergency  Cardiology protocol initiated . Placed a call to Dr. Mayford Knife ,  Hyman Hopes given  x2 about 5-10  mins apart vs taken on regular basis and charted and pain level monitored and charted . Rapid response called and responded . DR. Mayford Knife was informed of increasing pain level with nitro given with orders and to see the patient . Morphine sulfate given IVP with some relief . Nitro infusion was initiated and titrated accordingly  Up to 20 mcgs /hr . Lasix 40 mgs ivp was also given and charted on MAR by charge nurse, Juanita  1500 placed a call and spoken to pt's wife with pt' changes in status .  1510  Dr. Mayford Knife in spoken and evaluated the pt . Pt to transfer to ICU . Pt informed of plan . Portable xray was done . Continue monitoring done .  1530 report given to [facility] . To transfer to 2902    1550 pt  t o ICU via bed with the wife in attendance . Pt lethargic  , coherent and appropriate  With about 5 pain level

## 2011-08-22 NOTE — Progress Notes (Signed)
ANTICOAGULATION CONSULT NOTE  Pharmacy Consult for Heparin Indication: chest pain/ACS  Allergies  Allergen Reactions  . Codeine Nausea And Vomiting  . Latex Rash    Specifies adhesive     Patient Measurements: Height: 5\' 9"  (175.3 cm) Weight: 223 lb 11.2 oz (101.47 kg) (Scale C) IBW/kg (Calculated) : 70.7  Heparin Dosing Weight: 92.3  Vital Signs: Temp: 97.4 F (36.3 C) (05/07 2000) Temp src: Oral (05/07 2000) BP: 96/61 mmHg (05/07 2300) Pulse Rate: 73  (05/07 2300)  Labs:  Basename 08/22/11 2216 08/22/11 1450 08/22/11 1025  HGB -- -- 12.9*  HCT -- -- 40.1  PLT -- -- 290  APTT -- -- --  LABPROT -- -- 14.4  INR -- -- 1.10  HEPARINUNFRC 0.25* -- --  CREATININE -- -- 1.04  CKTOTAL PENDING 125 --  CKMB 6.0* 8.3* --  TROPONINI 0.53* 1.08* --    Estimated Creatinine Clearance: 79.8 ml/min (by C-G formula based on Cr of 1.04).  Assessment:  71 yo male with CP/ACS for Heparin  Goal of Therapy:  Heparin level 0.3-0.7 units/ml Monitor platelets by anticoagulation protocol: Yes   Plan:  Increase Heparin 1450 units/hr Follow-up am labs.  Eddie Candle 08/22/2011,11:38 PM

## 2011-08-22 NOTE — Progress Notes (Signed)
Dr. Mayford Knife notified of cardiac enzyme results and CBG results and treatment given.

## 2011-08-22 NOTE — Progress Notes (Signed)
CBG:36  Treatment: Glucose tab  Symptoms: diaphoresis  Follow-up CBG: Time 1635 CBG Result42  Possible Reasons for Event: insulin with poor PO intake Comments/MD notified Dr. Lynnea Ferrier, Kinnie Scales

## 2011-08-22 NOTE — Significant Event (Signed)
Rapid Response Event Note  Overview: Time Called: 1432 Arrival Time: 1435 Event Type: Cardiac  Initial Focused Assessment: Called to patient's bedside because pt c/o CP. Patient c/o CP 9/10, cool and clammy. Lung sounds with crackels in Right base. Pt responsive, but not very talkative. Dr Mayford Knife at bedside to assess patient.   Interventions: RN intitiated NGT gtt at 66mcg/min, gtt titrated to 27mcg/min per MD orders.  Pain level 9 decreased to 4. 40mg  lasix given IV per orders 2mg  Morphine given IV per orders. NSL started in Left forearm, good blood return, good flush. Transported pt to 2902 via bed with O2 and heart monitor.   Event Summary: Name of Physician Notified: Dr Mayford Knife at 1535    at    Outcome: Transferred (Comment) (tx to 2902)  Event End Time: 1600  [name]

## 2011-08-22 NOTE — Progress Notes (Signed)
Patient ID: [name], male   DOB: 07-Jun-1940, 71 y.o.   MRN: [id number] Called to see patient due to onset of substernal chest pain.  Patient developed chest pressure and diaphoresis along with SOB.  Exam shows diffuse rales throughout posteriorly.  Given SL NTG x2 and then started on IV NTG gtt.  He has received morphine 2mg  and IV Hepain gtt has been started.  EKG shows NSR with occ PVC and LBBB.  I suspect he is having acute pulmonary edema.  Will give Lasix 40mg  IV and get a chest xray.  Will transfer to CCU.  In review of films from cath in February he has severe 3 vessel ASCAD with occluded SVG to OM.  Not a redo CABG candidate.  Will try to manage medically.  Cardiac enzymes pending.

## 2011-08-22 NOTE — Progress Notes (Signed)
SUBJECTIVE:  less SOB today and feeling much better  OBJECTIVE:   Vitals:   Filed Vitals:   08/21/11 2144 08/22/11 0332 08/22/11 0622 08/22/11 1005  BP: 121/78 110/72 104/63 112/70  Pulse: 87 84 77   Temp: 97.5 F (36.4 C) 97.4 F (36.3 C) 97.7 F (36.5 C)   TempSrc: Oral Oral Oral   Resp: 18 18 17 18   Height:      Weight:  101.47 kg (223 lb 11.2 oz)    SpO2: 95% 96% 98% 98%   I&O's:   Intake/Output Summary (Last 24 hours) at 08/22/11 1042 Last data filed at 08/22/11 1001  Gross per 24 hour  Intake    393 ml  Output    950 ml  Net   -557 ml   TELEMETRY: Reviewed telemetry pt in NSR:     PHYSICAL EXAM General: Well developed, well nourished, in no acute distress Head: Eyes PERRLA, No xanthomas.   Normal cephalic and atramatic  Lungs:   Clear bilaterally to auscultation and percussion. Heart:   HRRR S1 S2 Pulses are 2+ & equal. Abdomen: Bowel sounds are positive, abdomen soft and non-tender without masses  Extremities:   Trace edema.  DP +1 Neuro: Alert and oriented X 3. Psych:  Good affect, responds appropriately   Coag Panel:   Lab Results  Component Value Date   INR 1.13 06/05/2011   INR 1.14 06/05/2011   INR 1.64* 03/06/2011    RADIOLOGY: US Renal  07/28/2011  *RADIOLOGY REPORT*  Clinical Data: Acute renal failure.  RENAL/URINARY TRACT ULTRASOUND COMPLETE  Comparison: CT 05/16/2009  Findings:  Right Kidney = 11.3 cm.  No evidence of hydronephrosis.  There is mild cortical thinning.  Left kidney = 11.5 cm.  No hydronephrosis.  Mild cortical thinning.  Bladder:  Bilateral ureteral jets are noted.  The prostate and gland is enlarged and indents the trigone region bladder.  The prostate measures approximately 5.0 x 3.8 x 4.8 cm.  Incidental note of gallstones in the gallbladder.  IMPRESSION:  1.  No evidence of hydronephrosis or obstruction. 2.  Mild bilateral renal cortical thinning. 3.  Prostate hypertrophy. 4.  Cholelithiasis  Original Report Authenticated By:  Genevive Bi, M.D.      ASSESSMENT:  1.  Acute on chronic systolic CHF 2.  CAD s/p redo CABG with SVG to OM occluded with chronic angina 3.  Ischemic DCM awaiting upgrade to BiV AICD 4.  DM 5.  Dyslipidemia 6.  gout  PLAN:   1.  Check BMET today 2.  Continue diuresis 3.  EP consult to see if he can have AICD upgrade this hospitalization  Quintella Reichert, MD  08/22/2011  10:42 AM

## 2011-08-23 DIAGNOSIS — I2589 Other forms of chronic ischemic heart disease: Secondary | ICD-10-CM

## 2011-08-23 DIAGNOSIS — I5022 Chronic systolic (congestive) heart failure: Secondary | ICD-10-CM

## 2011-08-23 LAB — CARDIAC PANEL(CRET KIN+CKTOT+MB+TROPI)
CK, MB: 5 ng/mL — ABNORMAL HIGH (ref 0.3–4.0)
Troponin I: 0.56 ng/mL (ref <0.30)

## 2011-08-23 LAB — BASIC METABOLIC PANEL
CO2: 24 mEq/L (ref 19–32)
Chloride: 99 mEq/L (ref 96–112)
Creatinine, Ser: 1.3 mg/dL (ref 0.50–1.35)
Glucose, Bld: 155 mg/dL — ABNORMAL HIGH (ref 70–99)

## 2011-08-23 LAB — CBC
HCT: 33.5 % — ABNORMAL LOW (ref 39.0–52.0)
Hemoglobin: 10.8 g/dL — ABNORMAL LOW (ref 13.0–17.0)
WBC: 8.3 10*3/uL (ref 4.0–10.5)

## 2011-08-23 LAB — GLUCOSE, CAPILLARY
Glucose-Capillary: 152 mg/dL — ABNORMAL HIGH (ref 70–99)
Glucose-Capillary: 69 mg/dL — ABNORMAL LOW (ref 70–99)
Glucose-Capillary: 81 mg/dL (ref 70–99)

## 2011-08-23 LAB — HEPARIN LEVEL (UNFRACTIONATED): Heparin Unfractionated: 0.56 IU/mL (ref 0.30–0.70)

## 2011-08-23 MED ORDER — INSULIN ASPART 100 UNIT/ML ~~LOC~~ SOLN: 25.0000 [IU] | Freq: Every day | SUBCUTANEOUS | Status: DC

## 2011-08-23 MED ORDER — SODIUM CHLORIDE 0.9 % IR SOLN
80.0000 mg | Status: AC
  Administered 2011-08-24: 80 mg
  Filled 2011-08-23: qty 2

## 2011-08-23 MED ORDER — SODIUM CHLORIDE 0.45 % IV SOLN
INTRAVENOUS | Status: DC
  Administered 2011-08-24: 06:00:00 via INTRAVENOUS

## 2011-08-23 MED ORDER — HEPARIN SODIUM (PORCINE) 5000 UNIT/ML IJ SOLN
5000.0000 [IU] | Freq: Three times a day (TID) | INTRAMUSCULAR | Status: DC
Start: 2011-08-23 — End: 2011-08-23
  Filled 2011-08-23 (×2): qty 1

## 2011-08-23 MED ORDER — INSULIN ASPART 100 UNIT/ML ~~LOC~~ SOLN
30.0000 [IU] | SUBCUTANEOUS | Status: DC
  Administered 2011-08-25: 15 [IU] via SUBCUTANEOUS

## 2011-08-23 MED ORDER — ALPRAZOLAM 0.25 MG PO TABS
0.2500 mg | ORAL_TABLET | Freq: Three times a day (TID) | ORAL | Status: DC | PRN
  Administered 2011-08-23: 0.25 mg via ORAL
  Filled 2011-08-23: qty 1

## 2011-08-23 MED ORDER — CEFAZOLIN SODIUM-DEXTROSE 2-3 GM-% IV SOLR
2.0000 g | INTRAVENOUS | Status: AC
  Administered 2011-08-24: 2 g via INTRAVENOUS
  Filled 2011-08-23: qty 50

## 2011-08-23 MED ORDER — SODIUM CHLORIDE 0.9 % IV SOLN
250.0000 mL | INTRAVENOUS | Status: DC
  Administered 2011-08-24: 250 mL via INTRAVENOUS

## 2011-08-23 MED ORDER — FUROSEMIDE 80 MG PO TABS
80.0000 mg | ORAL_TABLET | Freq: Two times a day (BID) | ORAL | Status: DC
  Administered 2011-08-23 – 2011-08-25 (×3): 80 mg via ORAL
  Filled 2011-08-23 (×4): qty 1

## 2011-08-23 MED ORDER — SODIUM CHLORIDE 0.9 % IJ SOLN: 3.0000 mL | INTRAMUSCULAR | Status: DC | PRN

## 2011-08-23 MED ORDER — SODIUM CHLORIDE 0.9 % IJ SOLN
3.0000 mL | Freq: Two times a day (BID) | INTRAMUSCULAR | Status: DC
  Administered 2011-08-23: 3 mL via INTRAVENOUS

## 2011-08-23 MED ORDER — FUROSEMIDE 10 MG/ML IJ SOLN
40.0000 mg | Freq: Once | INTRAMUSCULAR | Status: AC
  Administered 2011-08-23: 40 mg via INTRAVENOUS
  Filled 2011-08-23: qty 4

## 2011-08-23 NOTE — Progress Notes (Signed)
ANTICOAGULATION CONSULT NOTE - Follow Up Consult  Pharmacy Consult for heparin Indication: chest pain/ACS  Allergies  Allergen Reactions  . Codeine Nausea And Vomiting  . Latex Rash    Specifies adhesive     Patient Measurements: Height: 5\' 9"  (175.3 cm) Weight: 223 lb 12.3 oz (101.5 kg) IBW/kg (Calculated) : 70.7  Heparin Dosing Weight: 92.3kg  Vital Signs: Temp: 97.7 F (36.5 C) (05/08 0700) Temp src: Oral (05/08 0700) BP: 98/58 mmHg (05/08 0700) Pulse Rate: 77  (05/08 0700)  Labs:  Basename 08/23/11 0640 08/23/11 0630 08/22/11 2216 08/22/11 1450 08/22/11 1025  HGB 10.8* -- -- -- 12.9*  HCT 33.5* -- -- -- 40.1  PLT 233 -- -- -- 290  APTT -- -- -- -- --  LABPROT -- -- -- -- 14.4  INR -- -- -- -- 1.10  HEPARINUNFRC 0.56 -- 0.25* -- --  CREATININE 1.30 -- -- -- 1.04  CKTOTAL -- 72 89 125 --  CKMB -- 5.0* 6.0* 8.3* --  TROPONINI -- 0.56* 0.53* 1.08* --    Estimated Creatinine Clearance: 63.8 ml/min (by C-G formula based on Cr of 1.3).   Medications:  Infusions:    . sodium chloride 10 mL/hr at 08/22/11 2103  . heparin 1,450 Units/hr (08/23/11 0801)  . nitroGLYCERIN 10 mcg/min (08/22/11 1900)    Assessment: 70 yom on heparin for CP/ACS. Heparin level is now therapeutic at 0.56. No bleeding noted. H/H down slightly. Scr increased to 1.3.   Goal of Therapy:  Heparin level 0.3-0.7 units/ml Monitor platelets by anticoagulation protocol: Yes   Plan:  1. Continue heparin at 1450units/hr 2. F/u AM heparin level 3. F/u plan and renal fxn  Thos Matsumoto, Drake Leach 08/23/2011,8:42 AM

## 2011-08-23 NOTE — Progress Notes (Signed)
SUBJECTIVE: Doing much better.  No further chest pain after low BS resolved  OBJECTIVE:   Vitals:   Filed Vitals:   08/23/11 0900 08/23/11 1000 08/23/11 1100 08/23/11 1141  BP:      Pulse: 85 82 53   Temp:    97.9 F (36.6 C)  TempSrc:    Oral  Resp: 24 20 20    Height:      Weight:      SpO2: 93% 95% 97%    I&O's:   Intake/Output Summary (Last 24 hours) at 08/23/11 1401 Last data filed at 08/23/11 1100  Gross per 24 hour  Intake  796.9 ml  Output   2250 ml  Net -1453.1 ml   TELEMETRY: Reviewed telemetry pt in NSR     PHYSICAL EXAM General: Well developed, well nourished, in no acute distress Head: Eyes PERRLA, No xanthomas.   Normal cephalic and atramatic  Lungs:  Clear bilaterally to auscultation and percussion. Heart:  HRRR S1 S2 Pulses are 2+ & equal. Abdomen: Bowel sounds are positive, abdomen soft and non-tender without masses Extremities:  No clubbing, cyanosis or edema.  DP +1 Neuro: Alert and oriented X 3. Psych:  Good affect, responds appropriately   LABS: Basic Metabolic Panel:  Basename 08/23/11 0640 08/22/11 1025  NA 134* 137  K 3.9 3.8  CL 99 99  CO2 24 27  GLUCOSE 155* 123*  BUN 25* 18  CREATININE 1.30 1.04  CALCIUM 8.8 9.8  MG -- 2.0  PHOS -- --   Liver Function Tests:  Basename 08/22/11 1025  AST 33  ALT 32  ALKPHOS 110  BILITOT 0.8  PROT 7.5  ALBUMIN 3.7   No results found for this basename: LIPASE:2,AMYLASE:2 in the last 72 hours CBC:  Basename 08/23/11 0640 08/22/11 1025  WBC 8.3 7.6  NEUTROABS -- 5.2  HGB 10.8* 12.9*  HCT 33.5* 40.1  MCV 86.3 86.1  PLT 233 290   Cardiac Enzymes:  Basename 08/23/11 0630 08/22/11 2216 08/22/11 1450  CKTOTAL 72 89 125  CKMB 5.0* 6.0* 8.3*  CKMBINDEX -- -- --  TROPONINI 0.56* 0.53* 1.08*   Thyroid Function Tests:  Basename 08/22/11 1025  TSH 0.971  T4TOTAL --  T3FREE --  THYROIDAB --   Anemia Panel: No results found for this basename:  VITAMINB12,FOLATE,FERRITIN,TIBC,IRON,RETICCTPCT in the last 72 hours Coag Panel:   Lab Results  Component Value Date   INR 1.10 08/22/2011   INR 1.13 06/05/2011   INR 1.14 06/05/2011    RADIOLOGY: US Renal  07/28/2011  *RADIOLOGY REPORT*  Clinical Data: Acute renal failure.  RENAL/URINARY TRACT ULTRASOUND COMPLETE  Comparison: CT 05/16/2009  Findings:  Right Kidney = 11.3 cm.  No evidence of hydronephrosis.  There is mild cortical thinning.  Left kidney = 11.5 cm.  No hydronephrosis.  Mild cortical thinning.  Bladder:  Bilateral ureteral jets are noted.  The prostate and gland is enlarged and indents the trigone region bladder.  The prostate measures approximately 5.0 x 3.8 x 4.8 cm.  Incidental note of gallstones in the gallbladder.  IMPRESSION:  1.  No evidence of hydronephrosis or obstruction. 2.  Mild bilateral renal cortical thinning. 3.  Prostate hypertrophy. 4.  Cholelithiasis  Original Report Authenticated By: Genevive Bi, M.D.   Dg Chest Port 1 View  08/22/2011  *RADIOLOGY REPORT*  Clinical Data: Chest pain, shortness of breath  PORTABLE CHEST - 1 VIEW  Comparison: Portable exam 1547 hours compared to 06/05/2011  Findings: Left subclavian pacemaker / AICD  leads project over right atrium and right ventricle. Enlargement of cardiac silhouette post CABG. Pulmonary vascular congestion. Perihilar infiltrates question minimal edema. No gross pleural effusion or pneumothorax.  IMPRESSION: Enlargement of cardiac silhouette with pulmonary vascular congestion post CABG and AICD. Probable minimal perihilar edema.  Original Report Authenticated By: Lollie Marrow, M.D.      ASSESSMENT:  1. Acute on chronic systolic CHF - improved 2. CAD s/p redo CABG with SVG to OM occluded with chronic angina  3. Ischemic DCM awaiting upgrade to BiV AICD  4. DM  5. Dyslipidemia  6. Gout 7. Acute CP with CHF yesterday most likely due to severe hypoglycemia now resolved 8.  Minimally elevated Troponin due to Type  II NSTEMI from demand ischemia form CHF   PLAN:   1.  EP consult for BiVAICD upgrade 2.  Hospitalist consult for help with DM management - he has had some problems with hypoglycemia at home as well 3.  D/C Heparin 4.  SQ Heparin for DVT prophylaxis 5.  D/C IV NTG gtt 6.  Change Lasix to PO  Quintella Reichert, MD  08/23/2011  2:01 PM

## 2011-08-23 NOTE — Consult Note (Signed)
ELECTROPHYSIOLOGY CONSULT NOTE  Patient ID: [name] MRN: [id number], DOB/AGE: [birth year]   Admit date: 08/21/2011 Date of Consult: 08/23/2011  Primary Physician: Kari Baars, MD, MD Primary Cardiologist: Armanda Magic, MD Primary Electrophysiologist: [name], MD Reason for Consultation: Need for upgrade to bi-V ICD for CRT  History of Present Illness [name] is a 71 year old gentleman with a past medical history significant for an ischemic cardiomyopathy, EF 30% s/p AICD implantation, chronic systolic CHF, CAD s/p CABG 1997 with redo CABG 02/2011, HTN and dyslipidemia who has been admitted with acute on chronic systolic CHF. Due to his recurrent CHF symptoms despite optimal medical therapy, we have been asked to see him in consultation for possible bi-V ICD upgrade with cardiac resynchronization therapy. [name] describes increasing DOE x 3-4 months. He reports compliance with his medications and low sodium diet. He reports intermittent chest "heaviness" and lower extremity swelling. He sleeps on one pillow and denies orthopnea or PND. He denies cough. He was walking 1-2 miles on a treadmill 3-4 times weekly prior to Feb 2013 and now has noticed decreased exercise tolerance with SOB being the primary limiting factor. This is his third hospital admission this year. Of note, he was evaluated by Dr. [name] as an outpatient in March 2013 at which time it was recommended he undergo device upgrade to bi-V ICD with CRT.    Past Medical History  Diagnosis Date  . Ischemic cardiomyopathy     s/p ICD Implantation by Dr Amil Amen in 2006  s/p battery change and lead revision (5784) 01/2010 - Medtronic Protecta [id number]  . Chronic systolic dysfunction of left ventricle     EF 30%  . Hyperlipemia   . DJD (degenerative joint disease)   . Gout   . HTN (hypertension) 02/14/2011  . Depression   . Complication of anesthesia     ONCE WITH BACK SURGERY DIFFICULT TO WAKE UP  . Angina   .  Myocardial infarction 1997  . Cardiac defibrillator in situ   . DM (diabetes mellitus) 02/14/2011  . Shortness of breath on exertion   . CAD (coronary artery disease)     s/p CABG 1997, PCI (BMS) of SVG to RCA 3/09, redo CABG 02/2011 with SVG to PDA, SVG to Lcx, s/p cath 2.2013 with ocluded SVT to left circ and patent SVG to RCA  . CHF (congestive heart failure)     EF 30% by cath 05/2011  . ICD (implantable cardiac defibrillator) in place   . GERD (gastroesophageal reflux disease)     Past Surgical History  Procedure Date  . Cardiac defibrillator placement 08/2004    initial placement  . Knee arthrotomy ~ 1978    RIGHT KNEE CARTILAGE REMOVED  . Coronary angioplasty with stent placement 06/2007    BMS to SVG to RCA  . Coronary angioplasty 02/2011  . Coronary artery bypass graft 01/1996    CABG x 5 LIMA to LAD SVG to diag1,2,svg to om,svg to RCA  . Coronary artery bypass graft 03/06/2011    CABG X2; Procedure: REDO CORONARY ARTERY BYPASS GRAFTING (CABG);  Surgeon: Delight Ovens, MD;  Location: [facility] OR;  Service: Open Heart Surgery;  Laterality: N/A;  times two grafts using right saphenous vein harvested endoscopically.  . Cataract extraction w/ intraocular lens  implant, bilateral 2012  . Cardiac defibrillator placement 2009  . Lumbar disc surgery 2003  . Back surgery 2003    Allergies/Intolerances Allergies  Allergen Reactions  . Codeine  Nausea And Vomiting  . Latex Rash    Specifies adhesive     Inpatient Medications . allopurinol  200 mg Oral Daily  . aspirin EC  81 mg Oral Daily  . atorvastatin  40 mg Oral Daily  . carvedilol  25 mg Oral BID  . clopidogrel  75 mg Oral Q breakfast  . furosemide  80 mg Intravenous BID  . insulin aspart  30 Units Subcutaneous QAC supper  . insulin aspart  35 Units Subcutaneous Custom  . insulin glargine  35 Units Subcutaneous BID  . morphine injection  2 mg Intravenous Once  . niacin  1,000 mg Oral BID  . ranolazine  1,000 mg Oral BID    . venlafaxine XR  150 mg Oral Daily   . sodium chloride 10 mL/hr at 08/22/11 2103  . heparin 1,450 Units/hr (08/23/11 0801)  . nitroGLYCERIN 10 mcg/min (08/22/11 1900)   Family History Problem Relation Age of Onset  . Coronary artery disease      Social History Social History  . Marital Status: Married    Spouse Name: N/A    Number of Children: N/A  . Years of Education: N/A   Social History Main Topics  . Smoking status: Former Smoker -- 0.5 packs/day for 15 years    Types: Cigarettes    Quit date: 04/17/1969  . Smokeless tobacco: Never Used  . Alcohol Use: 0.6 oz/week    1 Cans of beer per week     occasional  . Drug Use: No  . Sexually Active: No   Social History Narrative   Lives in [place], retired Production designer, theatre/television/film for [employer].  He collects antique metal toys    Review of Systems General:  No chills, fever, night sweats or weight changes.  Cardiovascular:  No chest pain, dyspnea on exertion, edema, orthopnea, palpitations, paroxysmal nocturnal dyspnea. Dermatological: No rash, lesions/masses Respiratory: No cough, dyspnea Urologic: No hematuria, dysuria Abdominal:   No nausea, vomiting, diarrhea, bright red blood per rectum, melena, or hematemesis Neurologic:  No visual changes, wkns, changes in mental status. All other systems reviewed and are otherwise negative except as noted above.  Physical Exam Blood pressure 98/58, pulse 53, temperature 97.7 F (36.5 C), temperature source Oral, resp. rate 20, height 5\' 9"  (1.753 m), weight 223 lb 12.3 oz (101.5 kg), SpO2 97.00%.  General: Well developed, well appearing 71 year old male, sitting comfortably on side of bed, in no acute distress. HEENT: Normocephalic, atraumatic. EOMs intact. Sclera nonicteric. Oropharynx clear.  Neck: Supple without bruits. No JVD. Lungs:  Respirations regular and unlabored. Soft bibasilar rales notes otherwise CTA bilaterally. No wheezes or rhonchi. Heart: RRR. S1, S2  present. II/VI systolic murmur along LSB. No diastolic murmurs, rub, S3 or S4. Abdomen: Soft, non-tender, non-distended. BS present x 4 quadrants. No hepatosplenomegaly. No hepatojugular reflux.  Extremities: No clubbing, cyanosis or edema. DP/PT/Radials 2+ and equal bilaterally. Psych: Normal affect. Neuro: Alert and oriented X 3. Moves all extremities spontaneously.   Labs Basename 08/23/11 0630 08/22/11 2216 08/22/11 1450  CKTOTAL 72 89 125  CKMB 5.0* 6.0* 8.3*  TROPONINI 0.56* 0.53* 1.08*   Lab Results  Component Value Date   WBC 8.3 08/23/2011   HGB 10.8* 08/23/2011   HCT 33.5* 08/23/2011   MCV 86.3 08/23/2011   PLT 233 08/23/2011     Lab 08/23/11 0640 08/22/11 1025  NA 134* --  K 3.9 --  CL 99 --  CO2 24 --  BUN 25* --  CREATININE 1.30 --  CALCIUM 8.8 --  PROT -- 7.5  BILITOT -- 0.8  ALKPHOS -- 110  ALT -- 32  AST -- 33  GLUCOSE 155* --   No components found with this basename: MAGNESIUM No components found with this basename: POCBNP:3 No results found for this basename: CHOL, HDL, LDLCALC, TRIG   Basename 08/22/11 1025  TSH 0.971   Radiology/Studies Dg Chest Port 1 View  08/22/2011  *RADIOLOGY REPORT*  Clinical Data: Chest pain, shortness of breath  PORTABLE CHEST - 1 VIEW  Comparison: Portable exam 1547 hours compared to 06/05/2011  Findings: Left subclavian pacemaker / AICD leads project over right atrium and right ventricle. Enlargement of cardiac silhouette post CABG. Pulmonary vascular congestion. Perihilar infiltrates question minimal edema. No gross pleural effusion or pneumothorax.  IMPRESSION: Enlargement of cardiac silhouette with pulmonary vascular congestion post CABG and AICD. Probable minimal perihilar edema.  Original Report Authenticated By: Lollie Marrow, M.D.   Echocardiogram from Feb 2013  - Left ventricle: The cavity size was normal. Wall thickness was increased in a pattern of mild LVH. Systolic function was severely reduced. The estimated ejection  fraction was in the range of 25% to 30%. There was a reduced contribution of atrial contraction to ventricular filling, due to increased ventricular diastolic pressure or atrial contractile dysfunction. Doppler parameters are consistent with a reversible restrictive pattern, indicative of decreased left ventricular diastolic compliance and/or increased left atrial pressure (grade 3 diastolic dysfunction). - Mitral valve: Calcified annulus. Mild regurgitation. - Left atrium: The atrium was moderately dilated.  12-lead ECG performed in AM on 08/22/2011 - sinus rhythm with first degree AV block and LBBB; PR 280 ms, QRS 172 ms, QT/QTc 402/472 ms.  Telemetry normal sinus rhythm with occasional PVCs, ventricular couplets; no VT/VF  Assessment and Plan 1.  Acute on chronic systolic CHF in setting of known ischemic CM, CAD and LBBB [name] is admitted with recurrent CHF despite optimal medical therapy. He has an ischemic CM, NYHA Class III CHF, and LBBB. He is >90 days post revascularization and on a good medical regimen. He had a NSTEMI without revascularization 3 months ago. He is expected to receive benefit from cardiac resynchronization therapy given underlying LBBB. As previously discussed, we recommend proceeding with device upgrade to bi-V ICD with CRT.He is expected to receive benefit from cardiac resynchronization therapy given underlying LBBB. As previously discussed, we recommend proceeding with device upgrade to bi-V ICD with CRT. Risks, benefits and alternatives to upgrade to bi-V ICD were discussed in detail with the patient today. [name] understands the risks include, but are not limited to, bleeding, infection, pneumothorax, perforation, tamponade, vascular damage, renal failure, MI, stroke, death, and lead dislodgement and wishes to proceed.   The patient was seen, examined and plan of care formulated with Dr. Lewayne Bunting. Signed, Minda Meo., PA-C 08/23/2011, 11:41 AM  EP  Attending  Patient seen and independently examined. I have reviewed the findings as documented by [employer]. He has a class 1 indication for BiV ICD implant. I have discussed the risks/benefits/goals/expectations of upgrade to a BiV ICD from a standard ICD and he wishes to proceed.  Lewayne Bunting, M.D.

## 2011-08-23 NOTE — Progress Notes (Signed)
Inpatient Diabetes Program Recommendations  AACE/ADA: New Consensus Statement on Inpatient Glycemic Control (2009)  Target Ranges:  Prepandial:   less than 140 mg/dL      Peak postprandial:   less than 180 mg/dL (1-2 hours)      Critically ill patients:  140 - 180 mg/dL   Hypoglycemic episode yesterday 5/7 at 1611 CBG=36.  Patient received Novolog 35 units with lunch but only ate 30% of meal.   Novolog needs to be entered as MEAL COVERAGE per Glycemic Control order-set so that order-set  parameters will apply.    Thank you  Piedad Climes [facility] Inpatient Diabetes Coordinator [phone]

## 2011-08-24 ENCOUNTER — Encounter (HOSPITAL_COMMUNITY)
Admission: AD | Disposition: A | Discharge status: Home / Self Care | Payer: Self-pay | Source: Ambulatory Visit | Attending: Cardiology

## 2011-08-24 DIAGNOSIS — I2589 Other forms of chronic ischemic heart disease: Secondary | ICD-10-CM

## 2011-08-24 DIAGNOSIS — I509 Heart failure, unspecified: Secondary | ICD-10-CM

## 2011-08-24 HISTORY — PX: BI-VENTRICULAR IMPLANTABLE CARDIOVERTER DEFIBRILLATOR UPGRADE: SHX5461

## 2011-08-24 LAB — CBC
HCT: 34.5 % — ABNORMAL LOW (ref 39.0–52.0)
Hemoglobin: 11.2 g/dL — ABNORMAL LOW (ref 13.0–17.0)
MCH: 27.7 pg (ref 26.0–34.0)
MCHC: 32.5 g/dL (ref 30.0–36.0)
MCV: 85.2 fL (ref 78.0–100.0)
RBC: 4.05 MIL/uL — ABNORMAL LOW (ref 4.22–5.81)

## 2011-08-24 LAB — GLUCOSE, CAPILLARY
Glucose-Capillary: 61 mg/dL — ABNORMAL LOW (ref 70–99)
Glucose-Capillary: 89 mg/dL (ref 70–99)
Glucose-Capillary: 98 mg/dL (ref 70–99)

## 2011-08-24 LAB — BASIC METABOLIC PANEL
BUN: 26 mg/dL — ABNORMAL HIGH (ref 6–23)
CO2: 27 mEq/L (ref 19–32)
Chloride: 100 mEq/L (ref 96–112)
GFR calc non Af Amer: 50 mL/min — ABNORMAL LOW (ref >90)
Glucose, Bld: 65 mg/dL — ABNORMAL LOW (ref 70–99)
Potassium: 3.8 mEq/L (ref 3.5–5.1)
Sodium: 139 mEq/L (ref 135–145)

## 2011-08-24 SURGERY — BI-VENTRICULAR IMPLANTABLE CARDIOVERTER DEFIBRILLATOR UPGRADE
Anesthesia: LOCAL

## 2011-08-24 MED ORDER — ACETAMINOPHEN 325 MG PO TABS
325.0000 mg | ORAL_TABLET | ORAL | Status: DC | PRN
  Administered 2011-08-24: 650 mg via ORAL
  Administered 2011-08-25: 325 mg via ORAL
  Filled 2011-08-24 (×2): qty 2

## 2011-08-24 MED ORDER — ONDANSETRON HCL 4 MG/2ML IJ SOLN: 4.0000 mg | Freq: Four times a day (QID) | INTRAMUSCULAR | Status: DC | PRN

## 2011-08-24 MED ORDER — MIDAZOLAM HCL 5 MG/5ML IJ SOLN
INTRAMUSCULAR | Status: AC
  Filled 2011-08-24: qty 5

## 2011-08-24 MED ORDER — HEPARIN (PORCINE) IN NACL 2-0.9 UNIT/ML-% IJ SOLN
INTRAMUSCULAR | Status: AC
  Filled 2011-08-24: qty 1000

## 2011-08-24 MED ORDER — LIDOCAINE HCL (PF) 1 % IJ SOLN
INTRAMUSCULAR | Status: AC
  Filled 2011-08-24: qty 60

## 2011-08-24 MED ORDER — CEFAZOLIN SODIUM-DEXTROSE 2-3 GM-% IV SOLR
2.0000 g | Freq: Three times a day (TID) | INTRAVENOUS | Status: AC
  Administered 2011-08-24 – 2011-08-25 (×3): 2 g via INTRAVENOUS
  Filled 2011-08-24 (×3): qty 50

## 2011-08-24 MED ORDER — FENTANYL CITRATE 0.05 MG/ML IJ SOLN
INTRAMUSCULAR | Status: AC
  Filled 2011-08-24: qty 2

## 2011-08-24 MED ORDER — ZOLPIDEM TARTRATE 5 MG PO TABS
5.0000 mg | ORAL_TABLET | Freq: Once | ORAL | Status: AC
  Administered 2011-08-24: 5 mg via ORAL

## 2011-08-24 NOTE — Progress Notes (Signed)
SUBJECTIVE:  Doing well s/p BiV AICD upgrade  OBJECTIVE:   Vitals:   Filed Vitals:   08/24/11 0557 08/24/11 0600 08/24/11 0700 08/24/11 1548  BP: 114/63   124/72  Pulse: 67 71 79 71  Temp:    97.5 F (36.4 C)  TempSrc:    Oral  Resp: 15 18 16 22   Height:      Weight:      SpO2: 96% 96% 96% 97%   I&O's:   Intake/Output Summary (Last 24 hours) at 08/24/11 1624 Last data filed at 08/24/11 1600  Gross per 24 hour  Intake    420 ml  Output   1500 ml  Net  -1080 ml   TELEMETRY: Reviewed telemetry pt in NSR:     PHYSICAL EXAM General: Well developed, well nourished, in no acute distress Head: Eyes PERRLA, No xanthomas.   Normal cephalic and atramatic  Lungs:   Clear bilaterally to auscultation and percussion. Heart:   HRRR S1 S2 Pulses are 2+ & equal. Abdomen: Bowel sounds are positive, abdomen soft and non-tender without masses  Extremities:   No clubbing, cyanosis or edema.  DP +1 Neuro: Alert and oriented X 3. Psych:  Good affect, responds appropriately   LABS: Basic Metabolic Panel:  Basename 08/24/11 0435 08/23/11 0640 08/22/11 1025  NA 139 134* --  K 3.8 3.9 --  CL 100 99 --  CO2 27 24 --  GLUCOSE 65* 155* --  BUN 26* 25* --  CREATININE 1.40* 1.30 --  CALCIUM 9.0 8.8 --  MG -- -- 2.0  PHOS -- -- --   Liver Function Tests:  Basename 08/22/11 1025  AST 33  ALT 32  ALKPHOS 110  BILITOT 0.8  PROT 7.5  ALBUMIN 3.7   No results found for this basename: LIPASE:2,AMYLASE:2 in the last 72 hours CBC:  Basename 08/24/11 0435 08/23/11 0640 08/22/11 1025  WBC 8.2 8.3 --  NEUTROABS -- -- 5.2  HGB 11.2* 10.8* --  HCT 34.5* 33.5* --  MCV 85.2 86.3 --  PLT 233 233 --   Cardiac Enzymes:  Basename 08/23/11 0630 08/22/11 2216 08/22/11 1450  CKTOTAL 72 89 125  CKMB 5.0* 6.0* 8.3*  CKMBINDEX -- -- --  TROPONINI 0.56* 0.53* 1.08*   Thyroid Function Tests:  Basename 08/22/11 1025  TSH 0.971  T4TOTAL --  T3FREE --  THYROIDAB --   Anemia Panel: No  results found for this basename: VITAMINB12,FOLATE,FERRITIN,TIBC,IRON,RETICCTPCT in the last 72 hours Coag Panel:   Lab Results  Component Value Date   INR 1.10 08/22/2011   INR 1.13 06/05/2011   INR 1.14 06/05/2011    RADIOLOGY: US Renal  07/28/2011  *RADIOLOGY REPORT*  Clinical Data: Acute renal failure.  RENAL/URINARY TRACT ULTRASOUND COMPLETE  Comparison: CT 05/16/2009  Findings:  Right Kidney = 11.3 cm.  No evidence of hydronephrosis.  There is mild cortical thinning.  Left kidney = 11.5 cm.  No hydronephrosis.  Mild cortical thinning.  Bladder:  Bilateral ureteral jets are noted.  The prostate and gland is enlarged and indents the trigone region bladder.  The prostate measures approximately 5.0 x 3.8 x 4.8 cm.  Incidental note of gallstones in the gallbladder.  IMPRESSION:  1.  No evidence of hydronephrosis or obstruction. 2.  Mild bilateral renal cortical thinning. 3.  Prostate hypertrophy. 4.  Cholelithiasis  Original Report Authenticated By: Genevive Bi, M.D.   Dg Chest Port 1 View  08/22/2011  *RADIOLOGY REPORT*  Clinical Data: Chest pain, shortness of breath  PORTABLE CHEST - 1 VIEW  Comparison: Portable exam 1547 hours compared to 06/05/2011  Findings: Left subclavian pacemaker / AICD leads project over right atrium and right ventricle. Enlargement of cardiac silhouette post CABG. Pulmonary vascular congestion. Perihilar infiltrates question minimal edema. No gross pleural effusion or pneumothorax.  IMPRESSION: Enlargement of cardiac silhouette with pulmonary vascular congestion post CABG and AICD. Probable minimal perihilar edema.  Original Report Authenticated By: Lollie Marrow, M.D.      ASSESSMENT:  1. Acute on chronic systolic CHF - improved  2. CAD s/p redo CABG with SVG to OM occluded with chronic angina  - not a redo candidate and not a candidate for PCI 3. Ischemic DCM s/p upgrade to BiV AICD today 4. DM - still with hypoglycemia in am 5. Dyslipidemia  6. Gout  7. Acute  CP with CHF yesterday most likely due to severe hypoglycemia now resolved  8. Minimally elevated Troponin due to Type II NSTEMI from demand ischemia form CHF    PLAN:   1.  Hospitalist consult for DM management - BS still low this am despite cutting back on Novolog Insulin 2.  BMET in am 3.  Continue beta blocker/Ranexa/ASA/statin  Quintella Reichert, MD  08/24/2011  4:24 PM

## 2011-08-24 NOTE — Op Note (Signed)
NAMEAHMANI, PREHN NO.:  [id number]  MEDICAL RECORD NO.:  [id number]  LOCATION:  2915                         FACILITY:  [facility]  PHYSICIAN:  Doylene Canning. Ladona Ridgel, MD    DATE OF BIRTH:  04/04/1941  DATE OF PROCEDURE:  08/24/2011 DATE OF DISCHARGE:  08/24/2011                              OPERATIVE REPORT   PROCEDURE PERFORMED:  Removal of previously implanted dual-chamber ICD and insertion of a new LV pacing lead, insertion of a new Bi-V ICD utilizing coronary sinus venography and left upper extremity venography, and also with ICD pocket revision and defibrillation threshold testing.  INTRODUCTION:  The patient is a 71 year old man with a longstanding ischemic cardiomyopathy and chronic systolic heart failure, EF 20%.  He has left bundle-branch block.  His heart failure symptoms have worsened over the last 3-4 months with 3 hospitalizations for heart failure in the last 4 months.  The patient has been on maximal medical therapy.  He is now referred for upgrade to a Bi-V ICD.  Of note, the patient was admitted to the hospital for heart failure, up titration of medications prior to his procedure secondary to his very advanced heart failure.  PROCEDURE:  After informed was obtained, the patient was taken to the diagnostic EP lab in a fasting state.  After usual preparation and draping, intravenous fentanyl and midazolam were given for sedation.  A 30 mL of lidocaine was infiltrated into the left infraclavicular region. A 7-cm incision was carried out over this region and electrocautery was utilized to dissect down to the fascia plane.  Care was taken not to enter the ICD pocket.  Initial attempts to puncture the left subclavian vein were unsuccessful.  A 10-mL of contrast was then injected, demonstrated that the vein was displaced caudally.  It was subsequently successfully punctured and the central circulation accessed with an 035 guidewire.  The [facility] 9-French sheath  followed by the Medtronic guiding catheter were advanced into the right atrium.  A 6-French Hexapolar EP catheter was then utilized and advanced through the guiding catheter and the coronary sinus was cannulated.  The guiding catheter was then advanced over the EP catheter into the coronary sinus and venography of the coronary sinus subsequently carried out.  This demonstrated a nice lateral vein, which was selected for LV lead placement.  An 014 angioplasty guidewire was advanced into the LV lead without particular difficulty.  The Medtronic Attain Ability, model 4196, 88 cm bipolar pacing lead, serial number [id number], was advanced into the lateral vein of the left ventricle.  At a distance halfway from base to apex, the pacing threshold was less than a 1 V at 0.5 msec and the pacing impedance was 800 ohms.  Diaphragmatic stimulation was noted at 5 V, but not at 4 and with these satisfactory parameters, the sheath and guiding catheter were liberated from the lead.  The lead was secured to the subpectoral fascia with a figure-of-eight silk suture.  The sewing sleeve was secured with silk suture.  The pocket was irrigated. Electrocautery was then utilized to enter the subcutaneous ICD pocket and gentle traction was utilized to remove the old Medtronic dual- chamber ICD from  its pocket.  Electrocautery was utilized to free up the leads and revise the pocket to accommodate the different sized device. At this point, the Medtronic Protecta Bi-V ICD, serial number [id number] was connected to the old atrial and old defibrillator RV lead as well as the new LV lead and placed back in the subcutaneous pocket.  The pocket was irrigated with antibiotic irrigation, electrocautery was utilized to assure hemostasis, and the pocket was closed with 2-0 and 3-0 Vicryl. Benzoin and Steri-Strips were painted on the skin, and a pressure dressing was applied.  At this point, I scrubbed out of the case to  supervise defibrillation threshold testing.  After additional fentanyl and Versed were utilized to sedate the patient deeply under my direct supervision, defibrillation threshold testing was carried out.  VF was induced with a T-wave shock. A 20-joule shock was subsequently delivered, which terminated ventricular fibrillation and restored sinus rhythm.  At this point, no additional defibrillation threshold testing was carried out, and the patient was returned to his room in satisfactory condition.  COMPLICATIONS:  There were no immediate procedure complications.  RESULTS:  This demonstrates successful Bi-V ICD upgrade in a patient with left bundle-branch block, chronic systolic heart failure class 3, ejection fraction 20%.  The QRS duration was 175 msec.     Doylene Canning. Ladona Ridgel, MD     GWT/MEDQ  D:  08/24/2011  T:  08/24/2011  Job:  [id number]  cc:   Gloris Manchester R TURNER

## 2011-08-24 NOTE — Op Note (Signed)
BiV ICD upgrade without immediate complication. [id number].

## 2011-08-24 NOTE — Progress Notes (Signed)
Patient reports inability to sleep. "I don't think that pill is going to work". Patient questions this RN if he can have a sleeping pill. Paged and made Dr. Charm Barges aware of patient's request. Also informed him of conversation with Dr. Mayford Knife regarding using only the xanax order or Ambien order. Dr. Charm Barges updated on patient's current condition, vital signs and assessment. Verbal order received to give 5 mg PO Ambien for sleep.

## 2011-08-24 NOTE — H&P (Signed)
History of Present Illness  [name] is a 71 year old gentleman with a past medical history significant for an ischemic cardiomyopathy, EF 30% s/p AICD implantation, chronic systolic CHF, CAD s/p CABG 1997 with redo CABG 02/2011, HTN and dyslipidemia who has been admitted with acute on chronic systolic CHF. Due to his recurrent CHF symptoms despite optimal medical therapy, we have been asked to see him in consultation for possible bi-V ICD upgrade with cardiac resynchronization therapy. [name] describes increasing DOE x 3-4 months. He reports compliance with his medications and low sodium diet. He reports intermittent chest "heaviness" and lower extremity swelling. He sleeps on one pillow and denies orthopnea or PND. He denies cough. He was walking 1-2 miles on a treadmill 3-4 times weekly prior to Feb 2013 and now has noticed decreased exercise tolerance with SOB being the primary limiting factor. This is his third hospital admission this year. Of note, he was evaluated by Dr. [name] as an outpatient in March 2013 at which time it was recommended he undergo device upgrade to bi-V ICD with CRT.  Past Medical History   Diagnosis  Date   .  Ischemic cardiomyopathy      s/p ICD Implantation by Dr Amil Amen in 2006  s/p battery change and lead revision (1610) 01/2010 - Med[employer]onic Protecta [id number]   .  Chronic systolic dysfunction of left ventricle      EF 30%   .  Hyperlipemia    .  DJD (degenerative joint disease)    .  Gout    .  HTN (hypertension)  02/14/2011   .  Depression    .  Complication of anesthesia      ONCE WITH BACK SURGERY DIFFICULT TO WAKE UP   .  Angina    .  Myocardial infarction  1997   .  Cardiac defibrillator in situ    .  DM (diabetes mellitus)  02/14/2011   .  Shortness of breath on exertion    .  CAD (coronary artery disease)      s/p CABG 1997, PCI (BMS) of SVG to RCA 3/09, redo CABG 02/2011 with SVG to PDA, SVG to Lcx, s/p cath 2.2013 with ocluded SVT to left circ  and patent SVG to RCA   .  CHF (congestive heart failure)      EF 30% by cath 05/2011   .  ICD (implantable cardiac defibrillator) in place    .  GERD (gastroesophageal reflux disease)     Past Surgical History   Procedure  Date   .  Cardiac defibrillator placement  08/2004     initial placement   .  Knee arthrotomy  ~ 1978     RIGHT KNEE CARTILAGE REMOVED   .  Coronary angioplasty with stent placement  06/2007     BMS to SVG to RCA   .  Coronary angioplasty  02/2011   .  Coronary artery bypass graft  01/1996     CABG x 5 LIMA to LAD SVG to diag1,2,svg to om,svg to RCA   .  Coronary artery bypass graft  03/06/2011     CABG X2; Procedure: REDO CORONARY ARTERY BYPASS GRAFTING (CABG); Surgeon: Delight Ovens, MD; Location: [facility] OR; Service: Open Heart Surgery; Laterality: N/A; times two grafts using right saphenous vein harvested endoscopically.   .  Cataract extraction w/ intraocular lens implant, bilateral  2012   .  Cardiac defibrillator placement  2009   .  Lumbar disc  surgery  2003   .  Back surgery  2003   Allergies/Intolerances  Allergies   Allergen  Reactions   .  Codeine  Nausea And Vomiting   .  Latex  Rash     Specifies adhesive   Inpatient Medications  .  allopurinol  200 mg  Oral  Daily   .  aspirin EC  81 mg  Oral  Daily   .  atorvastatin  40 mg  Oral  Daily   .  carvedilol  25 mg  Oral  BID   .  clopidogrel  75 mg  Oral  Q breakfast   .  furosemide  80 mg  Intravenous  BID   .  insulin aspart  30 Units  Subcutaneous  QAC supper   .  insulin aspart  35 Units  Subcutaneous  Custom   .  insulin glargine  35 Units  Subcutaneous  BID   .  morphine injection  2 mg  Intravenous  Once   .  niacin  1,000 mg  Oral  BID   .  ranolazine  1,000 mg  Oral  BID   .  venlafaxine XR  150 mg  Oral  Daily    .  sodium chloride  10 mL/hr at 08/22/11 2103   .  heparin  1,450 Units/hr (08/23/11 0801)   .  nitroGLYCERIN  10 mcg/min (08/22/11 1900)   Family History  Problem  Relation   Age of Onset   .  Coronary artery disease     Social History  Social History   .  Marital Status:  Married     Spouse Name:  N/A     Number of Children:  N/A   .  Years of Education:  N/A    Social History Main Topics   .  Smoking status:  Former Smoker -- 0.5 packs/day for 15 years     Types:  Cigarettes     Quit date:  04/17/1969   .  Smokeless tobacco:  Never Used   .  Alcohol Use:  0.6 oz/week     1 Cans of beer per week      occasional   .  Drug Use:  No   .  Sexually Active:  No    Social History Narrative    Lives in [place], retired Production designer, theatre/television/film for [employer]. He collects antique metal toys   Review of Systems  General: No chills, fever, night sweats or weight changes.  Cardiovascular: No chest pain, dyspnea on exertion, edema, orthopnea, palpitations, paroxysmal nocturnal dyspnea.  Dermatological: No rash, lesions/masses  Respiratory: No cough, dyspnea  Urologic: No hematuria, dysuria  Abdominal: No nausea, vomiting, diarrhea, bright red blood per rectum, melena, or hematemesis  Neurologic: No visual changes, wkns, changes in mental status.  All other systems reviewed and are otherwise negative except as noted above.  Physical Exam  Blood pressure 98/58, pulse 53, temperature 97.7 F (36.5 C), temperature source Oral, resp. rate 20, height 5\' 9"  (1.753 m), weight 223 lb 12.3 oz (101.5 kg), SpO2 97.00%.  General: Well developed, well appearing 71 year old male, sitting comfortably on side of bed, in no acute distress.  HEENT: Normocephalic, atraumatic. EOMs intact. Sclera nonicteric. Oropharynx clear.  Neck: Supple without bruits. No JVD.  Lungs: Respirations regular and unlabored. Soft bibasilar rales notes otherwise CTA bilaterally. No wheezes or rhonchi.  Heart: RRR. S1, S2 present. II/VI systolic murmur along LSB. No diastolic murmurs, rub,  S3 or S4.  Abdomen: Soft, non-tender, non-distended. BS present x 4 quadrants. No hepatosplenomegaly. No  hepatojugular reflux.  Extremities: No clubbing, cyanosis or edema. DP/PT/Radials 2+ and equal bilaterally.  Psych: Normal affect.  Neuro: Alert and oriented X 3. Moves all extremities spontaneously.  Labs  [facility]  08/23/11 0630  08/22/11 2216  08/22/11 1450   CKTOTAL  72  89  125   CKMB  5.0*  6.0*  8.3*   TROPONINI  0.56*  0.53*  1.08*    Lab Results   Component  Value  Date    WBC  8.3  08/23/2011    HGB  10.8*  08/23/2011    HCT  33.5*  08/23/2011    MCV  86.3  08/23/2011    PLT  233  08/23/2011    Lab  08/23/11 0640  08/22/11 1025   NA  134*  --   K  3.9  --   CL  99  --   CO2  24  --   BUN  25*  --   CREATININE  1.30  --   CALCIUM  8.8  --   PROT  --  7.5   BILITOT  --  0.8   ALKPHOS  --  110   ALT  --  32   AST  --  33   GLUCOSE  155*  --   No components found with this basename: MAGNESIUM  No components found with this basename: POCBNP:3  No results found for this basename: CHOL, HDL, LDLCALC, TRIG    Basename  08/22/11 1025   TSH  0.971   Radiology/Studies  Dg Chest Port 1 View  08/22/2011 *RADIOLOGY REPORT* Clinical Data: Chest pain, shortness of breath PORTABLE CHEST - 1 VIEW Comparison: Portable exam 1547 hours compared to 06/05/2011 Findings: Left subclavian pacemaker / AICD leads project over right atrium and right ventricle. Enlargement of cardiac silhouette post CABG. Pulmonary vascular congestion. Perihilar infiltrates question minimal edema. No gross pleural effusion or pneumothorax. IMPRESSION: Enlargement of cardiac silhouette with pulmonary vascular congestion post CABG and AICD. Probable minimal perihilar edema. Original Report Authenticated By: Lollie Marrow, M.D.  Echocardiogram from Feb 2013  - Left ventricle: The cavity size was normal. Wall thickness was increased in a pattern of mild LVH. Systolic function was severely reduced. The estimated ejection fraction was in the range of 25% to 30%. There was a reduced contribution of atrial contraction to  ventricular filling, due to increased ventricular diastolic pressure or atrial contractile dysfunction. Doppler parameters are consistent with a reversible restrictive pattern, indicative of decreased left ventricular diastolic compliance and/or increased left atrial pressure (grade 3 diastolic dysfunction). - Mitral valve: Calcified annulus. Mild regurgitation. - Left atrium: The atrium was moderately dilated.  12-lead ECG performed in AM on 08/22/2011 - sinus rhythm with first degree AV block and LBBB; PR 280 ms, QRS 172 ms, QT/QTc 402/472 ms.  Telemetry normal sinus rhythm with occasional PVCs, ventricular couplets; no VT/VF  Assessment and Plan  1. Acute on chronic systolic CHF in setting of known ischemic CM, CAD and LBBB  Mr. Hoefling is admitted with recurrent CHF despite optimal medical therapy. He has an ischemic CM, NYHA Class III CHF, and LBBB. He is >90 days post revascularization and on a good medical regimen. He had a NSTEMI without revascularization 3 months ago. He is expected to receive benefit from cardiac resynchronization therapy given underlying LBBB. As previously discussed, we recommend proceeding with device upgrade to  bi-V ICD with CRT.He is expected to receive benefit from cardiac resynchronization therapy given underlying LBBB. As previously discussed, we recommend proceeding with device upgrade to bi-V ICD with CRT. Risks, benefits and alternatives to upgrade to bi-V ICD were discussed in detail with the patient today. Mr. Haydu understands the risks include, but are not limited to, bleeding, infection, pneumothorax, perforation, tamponade, vascular damage, renal failure, MI, stroke, death, and lead dislodgement and wishes to proceed.  The patient was seen, examined and plan of care formulated with Dr. Lewayne Bunting.  Signed,  Minda Meo., PA-C  08/23/2011, 11:41 AM  EP Attending  Patient seen and independently examined. I have reviewed the findings as documented by [employer]. He has a class 1 indication for BiV ICD implant. I have discussed the risks/benefits/goals/expectations of upgrade to a BiV ICD from a standard ICD and he wishes to proceed.  Lewayne Bunting, M.D.

## 2011-08-25 ENCOUNTER — Encounter (HOSPITAL_COMMUNITY)
Admission: AD | Disposition: A | Discharge status: Home / Self Care | Payer: Self-pay | Source: Ambulatory Visit | Attending: Cardiology

## 2011-08-25 ENCOUNTER — Inpatient Hospital Stay (HOSPITAL_COMMUNITY): Payer: Managed Care, Other (non HMO)

## 2011-08-25 DIAGNOSIS — IMO0002 Reserved for concepts with insufficient information to code with codable children: Secondary | ICD-10-CM

## 2011-08-25 DIAGNOSIS — I5023 Acute on chronic systolic (congestive) heart failure: Secondary | ICD-10-CM

## 2011-08-25 DIAGNOSIS — E118 Type 2 diabetes mellitus with unspecified complications: Secondary | ICD-10-CM

## 2011-08-25 DIAGNOSIS — E1165 Type 2 diabetes mellitus with hyperglycemia: Secondary | ICD-10-CM

## 2011-08-25 DIAGNOSIS — I2510 Atherosclerotic heart disease of native coronary artery without angina pectoris: Secondary | ICD-10-CM

## 2011-08-25 LAB — BASIC METABOLIC PANEL
BUN: 21 mg/dL (ref 6–23)
CO2: 30 mEq/L (ref 19–32)
Calcium: 9 mg/dL (ref 8.4–10.5)
Glucose, Bld: 140 mg/dL — ABNORMAL HIGH (ref 70–99)
Sodium: 138 mEq/L (ref 135–145)

## 2011-08-25 LAB — CBC
MCH: 27.1 pg (ref 26.0–34.0)
MCHC: 31.7 g/dL (ref 30.0–36.0)
MCV: 85.6 fL (ref 78.0–100.0)
Platelets: 238 10*3/uL (ref 150–400)
RBC: 4.17 MIL/uL — ABNORMAL LOW (ref 4.22–5.81)

## 2011-08-25 LAB — GLUCOSE, CAPILLARY: Glucose-Capillary: 70 mg/dL (ref 70–99)

## 2011-08-25 SURGERY — BI-VENTRICULAR IMPLANTABLE CARDIOVERTER DEFIBRILLATOR UPGRADE
Anesthesia: LOCAL

## 2011-08-25 MED ORDER — CLOPIDOGREL BISULFATE 75 MG PO TABS: 75.0000 mg | ORAL_TABLET | Freq: Every day | ORAL | Status: DC

## 2011-08-25 MED ORDER — INSULIN GLARGINE 100 UNIT/ML ~~LOC~~ SOLN: 30.0000 [IU] | Freq: Two times a day (BID) | SUBCUTANEOUS | Status: DC

## 2011-08-25 MED ORDER — INSULIN GLARGINE 100 UNIT/ML ~~LOC~~ SOLN
30.0000 [IU] | Freq: Two times a day (BID) | SUBCUTANEOUS | Status: DC
  Administered 2011-08-25: 15 [IU] via SUBCUTANEOUS

## 2011-08-25 MED ORDER — INSULIN GLULISINE 100 UNIT/ML ~~LOC~~ SOLN: SUBCUTANEOUS | Status: DC

## 2011-08-25 NOTE — Progress Notes (Signed)
Patient ID: [name], male   DOB: 05-[name]-1942, 71 y.o.   MRN: [id number] Subjective:  Minimal incisional tenderness.  Objective:  Vital Signs in the last 24 hours: Temp:  [97.5 F (36.4 C)-98.4 F (36.9 C)] 97.8 F (36.6 C) (05/10 0742) Pulse Rate:  [56-79] 72  (05/10 0742) Resp:  [15-24] 22  (05/10 0742) BP: (101-124)/(52-72) 118/62 mmHg (05/10 0742) SpO2:  [89 %-98 %] 98 % (05/10 0742) Weight:  [224 lb 2 oz (101.662 kg)] 224 lb 2 oz (101.662 kg) (05/10 0500)  Intake/Output from previous day: 05/09 0701 - 05/10 0700 In: 510 [P.O.:360; IV Piggyback:150] Out: 3550 [Urine:3550] Intake/Output from this shift: Total I/O In: 240 [P.O.:240] Out: 100 [Urine:100]  Physical Exam: Well appearing NAD HEENT: Unremarkable Neck:  No JVD, no thyromegally Lymphatics:  No adenopathy Back:  No CVA tenderness Lungs:  Clear with no wheezes. Well healed ICD incision. HEART:  Regular rate rhythm, no murmurs, no rubs, no clicks Abd:  Flat, positive bowel sounds, no organomegally, no rebound, no guarding Ext:  2 plus pulses, no edema, no cyanosis, no clubbing Skin:  No rashes no nodules Neuro:  CN II through XII intact, motor grossly intact  Lab Results:  Basename 08/25/11 0450 08/24/11 0435  WBC 8.9 8.2  HGB 11.3* 11.2*  PLT 238 233    Basename 08/25/11 0450 08/24/11 0435  NA 138 139  K 3.7 3.8  CL 98 100  CO2 30 27  GLUCOSE 140* 65*  BUN 21 26*  CREATININE 1.29 1.40*    Basename 08/23/11 0630 08/22/11 2216  TROPONINI 0.56* 0.53*   Hepatic Function Panel No results found for this basename: PROT,ALBUMIN,AST,ALT,ALKPHOS,BILITOT,BILIDIR,IBILI in the last 72 hours No results found for this basename: CHOL in the last 72 hours No results found for this basename: PROTIME in the last 72 hours  Imaging: Dg Chest 2 View  08/25/2011  *RADIOLOGY REPORT*  Clinical Data: Pacemaker upgrade  CHEST - 2 VIEW  Comparison: 08/22/2011  Findings: New left subclavian pacemaker lead with its  tip in the coronary sinus.  Left subclavian AICD device is otherwise stable. Normal heart size.  Low volumes.  No consolidation or mass.  No pneumothorax or pleural effusion.  IMPRESSION: New left subclavian pacemaker lead in the coronary sinus and no pneumothorax.  Original Report Authenticated By: Donavan Burnet, M.D.    Cardiac Studies: Tele - NSR with BiV Pacing  Assessment/Plan:  1. Acute on chronic CHF 2. LBBB 3. ICM 4. S/p BiV ICD Rec: ok to discharge from my perspective. Would like the patient to hold on his plavix for 7 days.  Lewayne Bunting, M.D.  LOS: 4 days    Lewayne Bunting 08/25/2011, 11:45 AM

## 2011-08-25 NOTE — Progress Notes (Signed)
Inpatient Diabetes Program Recommendations  AACE/ADA: New Consensus Statement on Inpatient Glycemic Control (2009)  Target Ranges:  Prepandial:   less than 140 mg/dL      Peak postprandial:   less than 180 mg/dL (1-2 hours)      Critically ill patients:  140 - 180 mg/dL   Diabetes Coordinator spoke with patient concerning home insulin regimen.  Patient states that he see Dr. Lucianne Muss and last saw him in March, but has an appointment May 28th.  Patient said that he has not been eating the same recently bc he has not felt good.  I recommended that he call Dr. Remus Blake office to report any hypoglycemic episodes and see if they want to see him sooner.  Discussed with wife keeping record of all CBGs, meals and insulin doses given.  Discussed hypoglycemia and the 15:15 rule and what to use for treatment.  Patient and wife express understanding.  Both are interested in attending the Nutrition and Diabetes Management [facility]) classes.  OP referral was entered for OP education.  Order will be faxed to [facility] for them to call and schedule an appt with patient.  Basic carbohydrate counting/meal planning information (handout) was given.   Thank you  Piedad Climes [facility] Inpatient Diabetes Coordinator [phone]

## 2011-08-25 NOTE — Discharge Summary (Addendum)
Patient ID: [name] MRN: [id number] DOB/AGE: 20-Aug-1940 71 y.o.  Admit date: 08/21/2011 Discharge date: 08/25/2011  Primary Discharge Diagnosis  Acute on Chronic Systolic heart failure  Secondary Discharge Diagnosis  CAD s/p remote CABG with redo 02/2011 s/p cath with occluded SVG to OM  Ischemic DCM EF 30% s/p BiV AICD upgrade  Dyslipidemia  DM  HTN  Depression  Gout  GERD    Significant Diagnostic Studies: None  Consults: cardiology EP Dr. Renee Rival Course: This is a 71yo WM with a history of remote CABG s/p recent redo 02/2011 with SVG to RCA and SVG to left circ who recently had a NSTEMI and underwent repeat cath showing occluded SVG to left circ.  He was deemed not to be a candidate for revascularization and has been on medical management for chronic angina.  He also has been awaiting BiVAICD upgrade due to CHF exacerbations despite maximal medical therapy and LBBB.   He was admitted to the hospital with acute SOB and diagnosed with acute on chronic systolic CHF.  He was diuresed aggressively.  During his hospital stay he had an episode of CP with worsening CHF which responded to Lasix.  He was found to have a low BS in the 30's at the time which was felt to be the cause of his acute decompensation of CP which resolved completely after repleting glucose.  He has a small elevation in cardiac enzymes which was felt to be due to Type II NSTEMI from CHF.  He diuresed well and underwent AICD upgrade to BiV on 5/9.  On day of d/c he is doing well ambulating in the hall.  Of note a hospitalist consult was obtained for help in management of BS which were low during hospital stay and apparently he has had some low readings at home lately as well.  His Lanuts insulin was adjusted.  Discharge Exam: Blood pressure 118/62, pulse 72, temperature 97.8 F (36.6 C), temperature source Oral, resp. rate 22, height 5\' 9"  (1.753 m), weight 101.662 kg (224 lb 2 oz), SpO2 98.00%.   General  appearance: alert Head: Normocephalic, without obvious abnormality, atraumatic Resp: clear to auscultation bilaterally Pacer site:  Clean dry and intact with no hematoma Cardio: regular rate and rhythm, S1, S2 normal, no murmur, click, rub or gallop Extremities: extremities normal, atraumatic, no cyanosis or edema Labs:   Lab Results  Component Value Date   WBC 8.9 08/25/2011   HGB 11.3* 08/25/2011   HCT 35.7* 08/25/2011   MCV 85.6 08/25/2011   PLT 238 08/25/2011    Lab 08/25/11 0450 08/22/11 1025  NA 138 --  K 3.7 --  CL 98 --  CO2 30 --  BUN 21 --  CREATININE 1.29 --  CALCIUM 9.0 --  PROT -- 7.5  BILITOT -- 0.8  ALKPHOS -- 110  ALT -- 32  AST -- 33  GLUCOSE 140* --   Lab Results  Component Value Date   CKTOTAL 72 08/23/2011   CKMB 5.0* 08/23/2011   TROPONINI 0.56* 08/23/2011    No results found for this basename: CHOL   No results found for this basename: HDL   No results found for this basename: LDLCALC   No results found for this basename: TRIG   No results found for this basename: CHOLHDL   No results found for this basename: LDLDIRECT      Radiology:*RADIOLOGY REPORT*  Clinical Data: Pacemaker upgrade  CHEST - 2 VIEW  Comparison: 08/22/2011  Findings: New  left subclavian pacemaker lead with its tip in the  coronary sinus. Left subclavian AICD device is otherwise stable.  Normal heart size. Low volumes. No consolidation or mass. No  pneumothorax or pleural effusion.  IMPRESSION:  New left subclavian pacemaker lead in the coronary sinus and no  pneumothorax.  Original Report Authenticated By: Donavan Burnet, M.D.  EKG:  NSR with vpaced rhythm  FOLLOW UP PLANS AND APPOINTMENTS Discharge Orders    Future Orders Please Complete By Expires   Diet - low sodium heart healthy      Increase activity slowly      Call MD for:  temperature >100.4      Call MD for:  persistant nausea and vomiting      Call MD for:  severe uncontrolled pain      Call MD for:   redness, tenderness, or signs of infection (pain, swelling, redness, odor or green/yellow discharge around incision site)      Call MD for:  difficulty breathing, headache or visual disturbances      Call MD for:  persistant dizziness or light-headedness      (HEART FAILURE PATIENTS) Call MD:  Anytime you have any of the following symptoms: 1) 3 pound weight gain in 24 hours or 5 pounds in 1 week 2) shortness of breath, with or without a dry hacking cough 3) swelling in the hands, feet or stomach 4) if you have to sleep on extra pillows at night in order to breathe.      Call MD for:  extreme fatigue        Medication List  As of 08/25/2011  8:23 AM   TAKE these medications         allopurinol 100 MG tablet   Commonly known as: ZYLOPRIM   Take 200 mg by mouth daily.      aspirin 81 MG tablet   Take 81 mg by mouth daily.      atorvastatin 40 MG tablet   Commonly known as: LIPITOR   Take 40 mg by mouth daily.      carvedilol 25 MG tablet   Commonly known as: COREG   Take 25 mg by mouth 2 (two) times daily.      clopidogrel 75 MG tablet   Commonly known as: PLAVIX   Take 1 tablet (75 mg total) by mouth daily with breakfast.      eplerenone 25 MG tablet   Commonly known as: INSPRA   Take 25 mg by mouth daily.      furosemide 40 MG tablet   Commonly known as: LASIX   Take 1 tablet (40 mg total) by mouth 2 (two) times daily.      insulin glargine 100 UNIT/ML injection   Commonly known as: LANTUS   Inject 30 Units into the skin 2 (two) times daily.      insulin glulisine 100 UNIT/ML injection   Commonly known as: APIDRA   Inject 25-30 Units into the skin 3 (three) times daily before meals. 30 units at breakfast & lunch and 25 units at supper      niacin 500 MG CR tablet   Commonly known as: NIASPAN   Take 1,000 mg by mouth 2 (two) times daily. Take 2 tablets by mouth two times a day      NITROSTAT 0.4 MG SL tablet   Generic drug: nitroGLYCERIN   Place 0.4 mg under the  tongue every 5 (five) minutes as needed. For chest pain  ranolazine 500 MG 12 hr tablet   Commonly known as: RANEXA   Take 1,000 mg by mouth 2 (two) times daily.      triamcinolone cream 0.1 %   Commonly known as: KENALOG   Apply 1 application topically daily as needed. Apply sparingly to affected area externally two times a day as needed for itching and rash      venlafaxine XR 150 MG 24 hr capsule   Commonly known as: EFFEXOR-XR   Take 150 mg by mouth daily.      zolpidem 12.5 MG CR tablet   Commonly known as: AMBIEN CR   Take 12.5 mg by mouth at bedtime as needed. For sleep           Follow-up Information    Follow up with FERGUSON,CYNTHIA A, NP on 09/04/2011. (at 1:50pm)    Contact information:   [facility] [address] [place] [address] [phone]          BRING ALL MEDICATIONS WITH YOU TO FOLLOW UP APPOINTMENTS  Time spent with patient to include physician time: 35 minutes  Signed: Chi Woodham R 08/25/2011, 8:23 AM

## 2011-08-25 NOTE — Discharge Instructions (Signed)
   Supplemental Discharge Instructions for  Pacemaker/Defibrillator Patients  Activity No heavy lifting or vigorous activity with your left/right arm for 6 to 8 weeks.  Do not raise your left/right arm above your head for one week.  Gradually raise your affected arm as drawn below.           05/12                       05/13                       05/14                      05/15       NO DRIVING for 1 week; you may begin driving on 40/98/1191. WOUND CARE   Keep the wound area clean and dry.  Do not get this area wet for one week. No showers for one week; you may shower on 09/01/2011.   The tape/steri-strips on your wound will fall off; do not pull them off.  No bandage is needed on the site.  DO  NOT apply any creams, oils, or ointments to the wound area.   If you notice any drainage or discharge from the wound, any swelling or bruising at the site, or you develop a fever > 101? F after you are discharged home, call the office at once.  Special Instructions   You are still able to use cellular telephones; use the ear opposite the side where you have your pacemaker/defibrillator.  Avoid carrying your cellular phone near your device.   When traveling through airports, show security personnel your identification card to avoid being screened in the metal detectors.  Ask the security personnel to use the hand wand.   Avoid arc welding equipment, MRI testing (magnetic resonance imaging), TENS units (transcutaneous nerve stimulators).  Call the office for questions about other devices.   Avoid electrical appliances that are in poor condition or are not properly grounded.   Microwave ovens are safe to be near or to operate.  Additional information for defibrillator patients should your device go off:   If your device goes off ONCE and you feel fine afterward, notify the device clinic nurses.   If your device goes off ONCE and you do not feel well afterward, call 911.   If your device goes off  TWICE, call 911.   If your device goes off THREE times in one day, call 911.  DO NOT DRIVE YOURSELF OR A FAMILY MEMBER WITH A DEFIBRILLATOR TO THE HOSPITAL--CALL 911.

## 2011-08-25 NOTE — Consult Note (Signed)
[facility] Medical Consultation  [name] JWJ:[id number] DOB: 02-Aug-1940 DOA: 08/21/2011 PCP: Kari Baars, MD, MD   Requesting physician: Gevena Cotton  Date of consultation: 08/25/11  Reason for consultation: Management of DM   Chief Complaint:  HPI:  71 y/o man with multiple comorbidities as below including DM ,was admitted on 5/6 by cardiology service for SOB and was treated with diuretics for acute on chronic systolic CHF .Patient was noted to be hypoglycemic on admission with BG of 30s ,which was treated with glucose supplemntaion ,his novolog insulin dose was adjusted and decreased ,his BG levels improved to range between 70s to 190s however he had couple of episodes when CBGs dropped to 60s.Apparently patient had hypoglycemic episodes at home as well,wife stated that he has not been eating well  .We are consulted for management of DM .  Review of Systems:  Patient denies any chest pain,SOB ,headaches,blurring of vision,or dizziness  He also denies any abdominal pain,nausea or vomiting  Past Medical History  Diagnosis Date  . Ischemic cardiomyopathy     s/p ICD Implantation by Dr Amil Amen  . Chronic systolic dysfunction of left ventricle     EF 30%  . Hyperlipemia   . DJD (degenerative joint disease)   . Gout   . HTN (hypertension) 02/14/2011  . Depression   . Complication of anesthesia     ONCE WITH BACK SURGERY DIFFICULT TO WAKE UP  . Angina   . Myocardial infarction 1997  . Cardiac defibrillator in situ   . DM (diabetes mellitus) 02/14/2011  . Shortness of breath on exertion   . CAD (coronary artery disease)     s/p CABG 1997, PCI (BMS) of SVG to RCA 3/09, redo CABG 02/2011 with SVG to PDA, SVG to Lcx, s/p cath 2.2013 with ocluded SVT to left circ and patent SVG to RCA  . CHF (congestive heart failure)     EF 30% by cath 05/2011  . ICD (implantable cardiac defibrillator) in place   . GERD (gastroesophageal reflux disease)    Past Surgical  History  Procedure Date  . Cardiac defibrillator placement 08/2004    initial placement  . Knee arthrotomy ~ 1978    RIGHT KNEE CARTILAGE REMOVED  . Coronary angioplasty with stent placement 06/2007    BMS to SVG to RCA  . Coronary angioplasty 02/2011  . Coronary artery bypass graft 01/1996    CABG x 5 LIMA to LAD SVG to diag1,2,svg to om,svg to RCA  . Coronary artery bypass graft 03/06/2011    CABG X2; Procedure: REDO CORONARY ARTERY BYPASS GRAFTING (CABG);  Surgeon: Delight Ovens, MD;  Location: [facility] OR;  Service: Open Heart Surgery;  Laterality: N/A;  times two grafts using right saphenous vein harvested endoscopically.  . Cataract extraction w/ intraocular lens  implant, bilateral 2012  . Cardiac defibrillator placement 2009  . Lumbar disc surgery 2003  . Back surgery 2003   Social History:  reports that he quit smoking about 42 years ago. His smoking use included Cigarettes. He has a 7.5 pack-year smoking history. He has never used smokeless tobacco. He reports that he drinks about .6 ounces of alcohol per week. He reports that he does not use illicit drugs.  Allergies  Allergen Reactions  . Codeine Nausea And Vomiting  . Latex Rash    Specifies adhesive    Family History  Problem Relation Age of Onset  . Coronary artery disease      Prior to Admission medications  Medication Sig Start Date End Date Taking? Authorizing Provider  allopurinol (ZYLOPRIM) 100 MG tablet Take 200 mg by mouth daily.    Yes Historical Provider, MD  aspirin 81 MG tablet Take 81 mg by mouth daily.    Yes Historical Provider, MD  atorvastatin (LIPITOR) 40 MG tablet Take 40 mg by mouth daily.    Yes Historical Provider, MD  carvedilol (COREG) 25 MG tablet Take 25 mg by mouth 2 (two) times daily.    Yes Historical Provider, MD  clopidogrel (PLAVIX) 75 MG tablet Take 1 tablet (75 mg total) by mouth daily with breakfast. 06/09/11 06/08/12 Yes Quintella Reichert, MD  eplerenone (INSPRA) 25 MG tablet Take 25 mg  by mouth daily.    Yes Historical Provider, MD  furosemide (LASIX) 40 MG tablet Take 1 tablet (40 mg total) by mouth 2 (two) times daily. 06/09/11 06/08/12 Yes Quintella Reichert, MD  insulin glargine (LANTUS) 100 UNIT/ML injection Inject 35 Units into the skin 2 (two) times daily.    Yes Historical Provider, MD  insulin glulisine (APIDRA) 100 UNIT/ML injection Inject 30-35 Units into the skin 3 (three) times daily before meals. 35 units at breakfast & lunch and 30 units at supper 02/16/11  Yes Reather Littler, MD  niacin (NIASPAN) 500 MG CR tablet Take 1,000 mg by mouth 2 (two) times daily. Take 2 tablets by mouth two times a day   Yes Historical Provider, MD  NITROSTAT 0.4 MG SL tablet Place 0.4 mg under the tongue every 5 (five) minutes as needed. For chest pain 02/14/11  Yes Historical Provider, MD  ranolazine (RANEXA) 500 MG 12 hr tablet Take 1,000 mg by mouth 2 (two) times daily.   Yes Historical Provider, MD  triamcinolone (KENALOG) 0.1 % cream Apply 1 application topically daily as needed. Apply sparingly to affected area externally two times a day as needed for itching and rash   Yes Historical Provider, MD  venlafaxine (EFFEXOR-XR) 150 MG 24 hr capsule Take 150 mg by mouth daily.     Yes Historical Provider, MD  zolpidem (AMBIEN CR) 12.5 MG CR tablet Take 12.5 mg by mouth at bedtime as needed. For sleep 04/04/11  Yes Historical Provider, MD   Physical Exam: Blood pressure 118/62, pulse 72, temperature 97.8 F (36.6 C), temperature source Oral, resp. rate 22, height 5\' 9"  (1.753 m), weight 101.662 kg (224 lb 2 oz), SpO2 98.00%. Filed Vitals:   08/25/11 0400 08/25/11 0500 08/25/11 0600 08/25/11 0742  BP:    118/62  Pulse: 75 75  72  Temp:    97.8 F (36.6 C)  TempSrc:    Oral  Resp: 19 18 17 22   Height:      Weight:  101.662 kg (224 lb 2 oz)    SpO2: 93% 95%  98%     General:  ALERT ,not in distress  Eyes: PERLA ,normal conjuctivae  Neck:  Supple   Cardiovascular: RRR,S1,S2 ,no  murmer  Respiratory:  Clear to auscultation bilaterllay  Abdomen: soft ,non tender   EXT; no edema or cyanosis  Neurologic: no focal deficet  Labs on Admission:  Basic Metabolic Panel:  Lab 08/25/11 4540 08/24/11 0435 08/23/11 0640 08/22/11 1025  NA 138 139 134* 137  K 3.7 3.8 -- --  CL 98 100 99 99  CO2 30 27 24 27   GLUCOSE 140* 65* 155* 123*  BUN 21 26* 25* 18  CREATININE 1.29 1.40* 1.30 1.04  CALCIUM 9.0 9.0 8.8 9.8  MG -- -- --  2.0  PHOS -- -- -- --   Liver Function Tests:  Lab 08/22/11 1025  AST 33  ALT 32  ALKPHOS 110  BILITOT 0.8  PROT 7.5  ALBUMIN 3.7   No results found for this basename: LIPASE:5,AMYLASE:5 in the last 168 hours No results found for this basename: AMMONIA:5 in the last 168 hours CBC:  Lab 08/25/11 0450 08/24/11 0435 08/23/11 0640 08/22/11 1025  WBC 8.9 8.2 8.3 7.6  NEUTROABS -- -- -- 5.2  HGB 11.3* 11.2* 10.8* 12.9*  HCT 35.7* 34.5* 33.5* 40.1  MCV 85.6 85.2 86.3 86.1  PLT 238 233 233 290   Cardiac Enzymes:  Lab 08/23/11 0630 08/22/11 2216 08/22/11 1450  CKTOTAL 72 89 125  CKMB 5.0* 6.0* 8.3*  CKMBINDEX -- -- --  TROPONINI 0.56* 0.53* 1.08*   BNP: No components found with this basename: POCBNP:5 CBG:  Lab 08/25/11 0741 08/24/11 2143 08/24/11 1632 08/24/11 1218 08/24/11 0735  GLUCAP 114* 195* 93 98 89    Radiological Exams on Admission: Dg Chest 2 View  08/25/2011  *RADIOLOGY REPORT*  Clinical Data: Pacemaker upgrade  CHEST - 2 VIEW  Comparison: 08/22/2011  Findings: New left subclavian pacemaker lead with its tip in the coronary sinus.  Left subclavian AICD device is otherwise stable. Normal heart size.  Low volumes.  No consolidation or mass.  No pneumothorax or pleural effusion.  IMPRESSION: New left subclavian pacemaker lead in the coronary sinus and no pneumothorax.  Original Report Authenticated By: Donavan Burnet, M.D.     Impression/Recommendations 1. DM2: Given recurrent hypoglycemic episodes and decrease po  intake ,I will decrease lantus dose to 30 untis bid ,novolog dose was already decreased by Dr Mayford Knife and I agree with that .Patient/Wife advised to check CBG s before meals and at bed time and write readings in a log book ,also to give meal coverage only if he eats >50% of meals,they verbalized understanding .Patient is followed by Dr Lucianne Muss with endocrinology as outpatient ,he was advised to call and make an appointment with him ASAP and also to follow with PCP.I will add on HBA1C to his AM labs ,that can be followed as outpatient.I also placed a consult for OP diabetic educator . 2/Acute on chronic systolic CHF/CAD S/P CABG/Dyslipidemia :as per Dr Mayford Knife 3/HTN:Well controlled  4/Gout : no acute flare ,on allopurinol  Thank you for this consultation.   08/25/2011, 9:13 AM

## 2011-08-28 ENCOUNTER — Encounter: Payer: Managed Care, Other (non HMO) | Admitting: Internal Medicine

## 2011-08-28 LAB — GLUCOSE, CAPILLARY: Glucose-Capillary: 77 mg/dL (ref 70–99)

## 2011-09-04 ENCOUNTER — Ambulatory Visit (INDEPENDENT_AMBULATORY_CARE_PROVIDER_SITE_OTHER): Payer: Managed Care, Other (non HMO) | Admitting: *Deleted

## 2011-09-04 ENCOUNTER — Encounter: Payer: Self-pay | Admitting: Internal Medicine

## 2011-09-04 DIAGNOSIS — I2589 Other forms of chronic ischemic heart disease: Secondary | ICD-10-CM

## 2011-09-04 DIAGNOSIS — Z79899 Other long term (current) drug therapy: Secondary | ICD-10-CM | POA: Diagnosis not present

## 2011-09-04 DIAGNOSIS — Z950 Presence of cardiac pacemaker: Secondary | ICD-10-CM | POA: Diagnosis not present

## 2011-09-04 DIAGNOSIS — I509 Heart failure, unspecified: Secondary | ICD-10-CM | POA: Diagnosis not present

## 2011-09-04 DIAGNOSIS — I255 Ischemic cardiomyopathy: Secondary | ICD-10-CM

## 2011-09-04 LAB — ICD DEVICE OBSERVATION
AL IMPEDENCE ICD: 456 Ohm
AL THRESHOLD: 1 V
BATTERY VOLTAGE: 3.2131 V
LV LEAD IMPEDENCE ICD: 665 Ohm
TOT-0001: 1
TOT-0002: 0
TOT-0006: 20130509000000
TZAT-0001ATACH: 2
TZAT-0001FASTVT: 1
TZAT-0001SLOWVT: 1
TZAT-0002ATACH: NEGATIVE
TZAT-0002FASTVT: NEGATIVE
TZAT-0012ATACH: 150 ms
TZAT-0012ATACH: 150 ms
TZAT-0012SLOWVT: 170 ms
TZAT-0013SLOWVT: 3
TZAT-0018ATACH: NEGATIVE
TZAT-0018FASTVT: NEGATIVE
TZAT-0018SLOWVT: NEGATIVE
TZAT-0019ATACH: 6 V
TZAT-0019ATACH: 6 V
TZAT-0020ATACH: 1.5 ms
TZAT-0020ATACH: 1.5 ms
TZAT-0020ATACH: 1.5 ms
TZAT-0020SLOWVT: 1.5 ms
TZON-0003ATACH: 350 ms
TZON-0003VSLOWVT: 450 ms
TZON-0004SLOWVT: 16
TZON-0005SLOWVT: 12
TZST-0001ATACH: 4
TZST-0001FASTVT: 3
TZST-0001FASTVT: 4
TZST-0001SLOWVT: 2
TZST-0001SLOWVT: 4
TZST-0001SLOWVT: 5
TZST-0002ATACH: NEGATIVE
TZST-0002FASTVT: NEGATIVE
TZST-0002FASTVT: NEGATIVE
TZST-0002FASTVT: NEGATIVE
TZST-0003SLOWVT: 35 J
TZST-0003SLOWVT: 35 J
TZST-0003SLOWVT: 35 J
VENTRICULAR PACING ICD: 97.96 pct

## 2011-09-04 NOTE — Progress Notes (Signed)
Wound check-ICD 

## 2011-09-12 DIAGNOSIS — IMO0001 Reserved for inherently not codable concepts without codable children: Secondary | ICD-10-CM | POA: Diagnosis not present

## 2011-09-12 DIAGNOSIS — E782 Mixed hyperlipidemia: Secondary | ICD-10-CM | POA: Diagnosis not present

## 2011-09-12 DIAGNOSIS — N289 Disorder of kidney and ureter, unspecified: Secondary | ICD-10-CM | POA: Diagnosis not present

## 2011-09-13 DIAGNOSIS — E7800 Pure hypercholesterolemia, unspecified: Secondary | ICD-10-CM | POA: Diagnosis not present

## 2011-09-13 DIAGNOSIS — I209 Angina pectoris, unspecified: Secondary | ICD-10-CM | POA: Diagnosis not present

## 2011-09-13 DIAGNOSIS — F329 Major depressive disorder, single episode, unspecified: Secondary | ICD-10-CM | POA: Diagnosis not present

## 2011-09-13 DIAGNOSIS — I10 Essential (primary) hypertension: Secondary | ICD-10-CM | POA: Diagnosis not present

## 2011-09-13 DIAGNOSIS — I252 Old myocardial infarction: Secondary | ICD-10-CM | POA: Diagnosis not present

## 2011-09-13 DIAGNOSIS — E669 Obesity, unspecified: Secondary | ICD-10-CM | POA: Diagnosis not present

## 2011-09-13 DIAGNOSIS — N289 Disorder of kidney and ureter, unspecified: Secondary | ICD-10-CM | POA: Diagnosis not present

## 2011-09-13 DIAGNOSIS — IMO0001 Reserved for inherently not codable concepts without codable children: Secondary | ICD-10-CM | POA: Diagnosis not present

## 2011-09-13 DIAGNOSIS — Z23 Encounter for immunization: Secondary | ICD-10-CM | POA: Diagnosis not present

## 2011-09-20 DIAGNOSIS — Z79899 Other long term (current) drug therapy: Secondary | ICD-10-CM | POA: Diagnosis not present

## 2011-09-20 DIAGNOSIS — IMO0001 Reserved for inherently not codable concepts without codable children: Secondary | ICD-10-CM | POA: Diagnosis not present

## 2011-10-04 DIAGNOSIS — I2589 Other forms of chronic ischemic heart disease: Secondary | ICD-10-CM | POA: Diagnosis not present

## 2011-10-04 DIAGNOSIS — E782 Mixed hyperlipidemia: Secondary | ICD-10-CM | POA: Diagnosis not present

## 2011-10-04 DIAGNOSIS — I5022 Chronic systolic (congestive) heart failure: Secondary | ICD-10-CM | POA: Diagnosis not present

## 2011-10-04 DIAGNOSIS — I10 Essential (primary) hypertension: Secondary | ICD-10-CM | POA: Diagnosis not present

## 2011-10-04 DIAGNOSIS — I2510 Atherosclerotic heart disease of native coronary artery without angina pectoris: Secondary | ICD-10-CM | POA: Diagnosis not present

## 2011-10-26 DIAGNOSIS — Z79899 Other long term (current) drug therapy: Secondary | ICD-10-CM | POA: Diagnosis not present

## 2011-10-26 DIAGNOSIS — I10 Essential (primary) hypertension: Secondary | ICD-10-CM | POA: Diagnosis not present

## 2011-10-26 DIAGNOSIS — IMO0001 Reserved for inherently not codable concepts without codable children: Secondary | ICD-10-CM | POA: Diagnosis not present

## 2011-10-26 DIAGNOSIS — E7800 Pure hypercholesterolemia, unspecified: Secondary | ICD-10-CM | POA: Diagnosis not present

## 2011-10-26 DIAGNOSIS — N289 Disorder of kidney and ureter, unspecified: Secondary | ICD-10-CM | POA: Diagnosis not present

## 2011-10-26 DIAGNOSIS — I509 Heart failure, unspecified: Secondary | ICD-10-CM | POA: Diagnosis not present

## 2011-10-26 DIAGNOSIS — Z950 Presence of cardiac pacemaker: Secondary | ICD-10-CM | POA: Diagnosis not present

## 2011-10-26 DIAGNOSIS — I2589 Other forms of chronic ischemic heart disease: Secondary | ICD-10-CM | POA: Diagnosis not present

## 2011-10-30 DIAGNOSIS — I2510 Atherosclerotic heart disease of native coronary artery without angina pectoris: Secondary | ICD-10-CM | POA: Diagnosis not present

## 2011-10-30 DIAGNOSIS — I2589 Other forms of chronic ischemic heart disease: Secondary | ICD-10-CM | POA: Diagnosis not present

## 2011-10-30 DIAGNOSIS — E782 Mixed hyperlipidemia: Secondary | ICD-10-CM | POA: Diagnosis not present

## 2011-10-30 DIAGNOSIS — I5022 Chronic systolic (congestive) heart failure: Secondary | ICD-10-CM | POA: Diagnosis not present

## 2011-10-30 DIAGNOSIS — I10 Essential (primary) hypertension: Secondary | ICD-10-CM | POA: Diagnosis not present

## 2011-11-16 DIAGNOSIS — I10 Essential (primary) hypertension: Secondary | ICD-10-CM | POA: Diagnosis not present

## 2011-11-16 DIAGNOSIS — E782 Mixed hyperlipidemia: Secondary | ICD-10-CM | POA: Diagnosis not present

## 2011-11-16 DIAGNOSIS — IMO0001 Reserved for inherently not codable concepts without codable children: Secondary | ICD-10-CM | POA: Diagnosis not present

## 2011-12-04 ENCOUNTER — Encounter: Payer: Self-pay | Admitting: Internal Medicine

## 2011-12-04 ENCOUNTER — Ambulatory Visit (INDEPENDENT_AMBULATORY_CARE_PROVIDER_SITE_OTHER): Payer: Managed Care, Other (non HMO) | Admitting: Internal Medicine

## 2011-12-04 VITALS — BP 140/52 | HR 76 | Resp 18 | Ht 70.0 in | Wt 225.8 lb

## 2011-12-04 DIAGNOSIS — I255 Ischemic cardiomyopathy: Secondary | ICD-10-CM

## 2011-12-04 DIAGNOSIS — I2589 Other forms of chronic ischemic heart disease: Secondary | ICD-10-CM | POA: Diagnosis not present

## 2011-12-04 LAB — ICD DEVICE OBSERVATION
AL IMPEDENCE ICD: 456 Ohm
AL THRESHOLD: 1.125 V
BAMS-0001: 170 {beats}/min
BATTERY VOLTAGE: 3.2062 V
PACEART VT: 0
RV LEAD AMPLITUDE: 31.625 mv
RV LEAD THRESHOLD: 0.875 V
TOT-0002: 0
TOT-0006: 20130509000000
TZAT-0001FASTVT: 1
TZAT-0001SLOWVT: 1
TZAT-0002ATACH: NEGATIVE
TZAT-0002FASTVT: NEGATIVE
TZAT-0004SLOWVT: 8
TZAT-0012ATACH: 150 ms
TZAT-0012FASTVT: 170 ms
TZAT-0018FASTVT: NEGATIVE
TZAT-0019ATACH: 6 V
TZAT-0019ATACH: 6 V
TZAT-0019FASTVT: 8 V
TZAT-0020ATACH: 1.5 ms
TZAT-0020ATACH: 1.5 ms
TZAT-0020SLOWVT: 1.5 ms
TZON-0003ATACH: 350 ms
TZST-0001ATACH: 4
TZST-0001ATACH: 5
TZST-0001FASTVT: 4
TZST-0001FASTVT: 6
TZST-0001SLOWVT: 2
TZST-0001SLOWVT: 4
TZST-0002ATACH: NEGATIVE
TZST-0002FASTVT: NEGATIVE
TZST-0002FASTVT: NEGATIVE
TZST-0002FASTVT: NEGATIVE
TZST-0003SLOWVT: 30 J
TZST-0003SLOWVT: 35 J
TZST-0003SLOWVT: 35 J
TZST-0003SLOWVT: 35 J
VENTRICULAR PACING ICD: 99.53 pct
VF: 0

## 2011-12-04 NOTE — Patient Instructions (Addendum)
Your physician recommends that you schedule a follow-up appointment in: 3 months with device clinic.   Your physician wants you to follow-up in: 12 months with Dr Johney Frame.  You will receive a reminder letter in the mail two months in advance. If you don't receive a letter, please call our office to schedule the follow-up appointment.

## 2011-12-04 NOTE — Progress Notes (Signed)
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  Since his upgrade to a BiV ICD 5/13, the patient reports doing very well.  He is pleased with his improvement in CHF symptoms.  Today, he denies symptoms of palpitations, chest pain, shortness of breath,  lower extremity edema, dizziness, presyncope, syncope, or ICD shocks.  The patient is otherwise without complaint today.   Past Medical History  Diagnosis Date  . Ischemic cardiomyopathy     s/p ICD Implantation by Dr Amil Amen  . Chronic systolic dysfunction of left ventricle     EF 30%  . Hyperlipemia   . DJD (degenerative joint disease)   . Gout   . HTN (hypertension) 02/14/2011  . Depression   . Complication of anesthesia     ONCE WITH BACK SURGERY DIFFICULT TO WAKE UP  . Angina   . Myocardial infarction 1997  . Cardiac defibrillator in situ   . DM (diabetes mellitus) 02/14/2011  . Shortness of breath on exertion   . CAD (coronary artery disease)     s/p CABG 1997, PCI (BMS) of SVG to RCA 3/09, redo CABG 02/2011 with SVG to PDA, SVG to Lcx, s/p cath 2.2013 with ocluded SVT to left circ and patent SVG to RCA  . CHF (congestive heart failure)     EF 30% by cath 05/2011  . ICD (implantable cardiac defibrillator) in place   . GERD (gastroesophageal reflux disease)    Past Surgical History  Procedure Date  . Cardiac defibrillator placement 08/2004    initial placement, upgraded to BIV ICD by Dr Ladona Ridgel 08/24/11 (MDT)  . Knee arthrotomy ~ 1978    RIGHT KNEE CARTILAGE REMOVED  . Coronary angioplasty with stent placement 06/2007    BMS to SVG to RCA  . Coronary angioplasty 02/2011  . Coronary artery bypass graft 01/1996    CABG x 5 LIMA to LAD SVG to diag1,2,svg to om,svg to RCA  . Coronary artery bypass graft 03/06/2011    CABG X2; Procedure: REDO CORONARY ARTERY BYPASS GRAFTING (CABG);  Surgeon: Delight Ovens, MD;  Location: [facility] OR;   Service: Open Heart Surgery;  Laterality: N/A;  times two grafts using right saphenous vein harvested endoscopically.  . Cataract extraction w/ intraocular lens  implant, bilateral 2012  . Cardiac defibrillator placement 2009  . Lumbar disc surgery 2003  . Back surgery 2003    Current Outpatient Prescriptions  Medication Sig Dispense Refill  . allopurinol (ZYLOPRIM) 100 MG tablet Take 200 mg by mouth daily.       Marland Kitchen aspirin 81 MG tablet Take 81 mg by mouth daily.       Marland Kitchen atorvastatin (LIPITOR) 40 MG tablet Take 40 mg by mouth daily.       Marland Kitchen BAYER CONTOUR TEST test strip       . carvedilol (COREG) 25 MG tablet Take 25 mg by mouth 2 (two) times daily.       . clopidogrel (PLAVIX) 75 MG tablet Take 1 tablet (75 mg total) by mouth daily with breakfast. Hold Plavix for 1 week post bi-V ICD upgrade. Resume on 08/29/2011.  30 tablet  11  . eplerenone (INSPRA) 25 MG tablet Take 25 mg by mouth daily.       . furosemide (LASIX) 40 MG tablet Take 1 tablet (40 mg total) by mouth 2 (two) times daily.  60 tablet  11  . insulin  glargine (LANTUS) 100 UNIT/ML injection Inject 25 Units into the skin 2 (two) times daily.      . insulin glulisine (APIDRA) 100 UNIT/ML injection 30 Units 2 (two) times daily.      . niacin (NIASPAN) 500 MG CR tablet Take 1,000 mg by mouth 2 (two) times daily. Take 2 tablets by mouth two times a day      . NITROSTAT 0.4 MG SL tablet Place 0.4 mg under the tongue every 5 (five) minutes as needed. For chest pain      . ranolazine (RANEXA) 500 MG 12 hr tablet Take 1,000 mg by mouth 2 (two) times daily.      Marland Kitchen triamcinolone (KENALOG) 0.1 % cream Apply 1 application topically daily as needed. Apply sparingly to affected area externally two times a day as needed for itching and rash      . venlafaxine (EFFEXOR-XR) 150 MG 24 hr capsule Take 150 mg by mouth daily.        Marland Kitchen zolpidem (AMBIEN CR) 12.5 MG CR tablet Take 12.5 mg by mouth at bedtime as needed. For sleep        Physical  Exam: Filed Vitals:   12/04/11 1043  BP: 140/52  Pulse: 76  Resp: 18  Height: 5\' 10"  (1.778 m)  Weight: 225 lb 12.8 oz (102.422 kg)  SpO2: 97%    GEN- The patient is well appearing, alert and oriented x 3 today.   Head- normocephalic, atraumatic Eyes-  Sclera clear, conjunctiva pink Ears- hearing intact Oropharynx- clear Lungs- Clear to ausculation bilaterally, normal work of breathing Chest- ICD pocket is well healed, skin over the incision is thin but not retracted Heart- Regular rate and rhythm, no murmurs, rubs or gallops, PMI not laterally displaced GI- soft, NT, ND, + BS Extremities- no clubbing, cyanosis, or edema  ICD interrogation- reviewed in detail today,  See [facility] report  Assessment and Plan:

## 2011-12-04 NOTE — Assessment & Plan Note (Signed)
Doing well s/p BiV ICD upgrade Enroll in [facility] I will see again in 1 year  Follow-up with Dr Mayford Knife as scheduled

## 2011-12-23 ENCOUNTER — Inpatient Hospital Stay (HOSPITAL_COMMUNITY)
Admission: EM | Admit: 2011-12-23 | Discharge: 2011-12-26 | DRG: 445 | Disposition: A | Discharge status: Home / Self Care | Payer: Managed Care, Other (non HMO) | Attending: Internal Medicine | Admitting: Internal Medicine

## 2011-12-23 ENCOUNTER — Encounter (HOSPITAL_COMMUNITY): Payer: Self-pay | Admitting: Vascular Surgery

## 2011-12-23 ENCOUNTER — Emergency Department (HOSPITAL_COMMUNITY): Payer: Managed Care, Other (non HMO)

## 2011-12-23 DIAGNOSIS — Z01810 Encounter for preprocedural cardiovascular examination: Secondary | ICD-10-CM

## 2011-12-23 DIAGNOSIS — M109 Gout, unspecified: Secondary | ICD-10-CM | POA: Diagnosis present

## 2011-12-23 DIAGNOSIS — F329 Major depressive disorder, single episode, unspecified: Secondary | ICD-10-CM | POA: Diagnosis present

## 2011-12-23 DIAGNOSIS — I252 Old myocardial infarction: Secondary | ICD-10-CM

## 2011-12-23 DIAGNOSIS — R112 Nausea with vomiting, unspecified: Secondary | ICD-10-CM

## 2011-12-23 DIAGNOSIS — K7460 Unspecified cirrhosis of liver: Secondary | ICD-10-CM | POA: Diagnosis present

## 2011-12-23 DIAGNOSIS — D649 Anemia, unspecified: Secondary | ICD-10-CM | POA: Diagnosis present

## 2011-12-23 DIAGNOSIS — I951 Orthostatic hypotension: Secondary | ICD-10-CM | POA: Diagnosis present

## 2011-12-23 DIAGNOSIS — Z87891 Personal history of nicotine dependence: Secondary | ICD-10-CM

## 2011-12-23 DIAGNOSIS — I10 Essential (primary) hypertension: Secondary | ICD-10-CM

## 2011-12-23 DIAGNOSIS — I255 Ischemic cardiomyopathy: Secondary | ICD-10-CM

## 2011-12-23 DIAGNOSIS — E871 Hypo-osmolality and hyponatremia: Secondary | ICD-10-CM | POA: Diagnosis present

## 2011-12-23 DIAGNOSIS — Z950 Presence of cardiac pacemaker: Secondary | ICD-10-CM

## 2011-12-23 DIAGNOSIS — E785 Hyperlipidemia, unspecified: Secondary | ICD-10-CM

## 2011-12-23 DIAGNOSIS — K219 Gastro-esophageal reflux disease without esophagitis: Secondary | ICD-10-CM | POA: Diagnosis present

## 2011-12-23 DIAGNOSIS — E119 Type 2 diabetes mellitus without complications: Secondary | ICD-10-CM

## 2011-12-23 DIAGNOSIS — R42 Dizziness and giddiness: Secondary | ICD-10-CM | POA: Diagnosis not present

## 2011-12-23 DIAGNOSIS — I509 Heart failure, unspecified: Secondary | ICD-10-CM | POA: Diagnosis present

## 2011-12-23 DIAGNOSIS — K766 Portal hypertension: Secondary | ICD-10-CM | POA: Diagnosis present

## 2011-12-23 DIAGNOSIS — I5022 Chronic systolic (congestive) heart failure: Secondary | ICD-10-CM | POA: Diagnosis present

## 2011-12-23 DIAGNOSIS — I2589 Other forms of chronic ischemic heart disease: Secondary | ICD-10-CM | POA: Diagnosis present

## 2011-12-23 DIAGNOSIS — I2510 Atherosclerotic heart disease of native coronary artery without angina pectoris: Secondary | ICD-10-CM

## 2011-12-23 DIAGNOSIS — Z951 Presence of aortocoronary bypass graft: Secondary | ICD-10-CM

## 2011-12-23 DIAGNOSIS — M1990 Unspecified osteoarthritis, unspecified site: Secondary | ICD-10-CM | POA: Diagnosis present

## 2011-12-23 DIAGNOSIS — F3289 Other specified depressive episodes: Secondary | ICD-10-CM | POA: Diagnosis present

## 2011-12-23 DIAGNOSIS — Z794 Long term (current) use of insulin: Secondary | ICD-10-CM

## 2011-12-23 DIAGNOSIS — D72829 Elevated white blood cell count, unspecified: Secondary | ICD-10-CM | POA: Diagnosis present

## 2011-12-23 DIAGNOSIS — Z79899 Other long term (current) drug therapy: Secondary | ICD-10-CM

## 2011-12-23 DIAGNOSIS — I498 Other specified cardiac arrhythmias: Secondary | ICD-10-CM | POA: Diagnosis present

## 2011-12-23 DIAGNOSIS — E876 Hypokalemia: Secondary | ICD-10-CM | POA: Diagnosis present

## 2011-12-23 DIAGNOSIS — R404 Transient alteration of awareness: Secondary | ICD-10-CM | POA: Diagnosis not present

## 2011-12-23 DIAGNOSIS — R197 Diarrhea, unspecified: Secondary | ICD-10-CM

## 2011-12-23 DIAGNOSIS — R55 Syncope and collapse: Secondary | ICD-10-CM

## 2011-12-23 DIAGNOSIS — Z9861 Coronary angioplasty status: Secondary | ICD-10-CM

## 2011-12-23 DIAGNOSIS — K8000 Calculus of gallbladder with acute cholecystitis without obstruction: Principal | ICD-10-CM | POA: Diagnosis present

## 2011-12-23 DIAGNOSIS — K810 Acute cholecystitis: Secondary | ICD-10-CM | POA: Diagnosis present

## 2011-12-23 LAB — COMPREHENSIVE METABOLIC PANEL WITH GFR
Albumin: 2.8 g/dL — ABNORMAL LOW (ref 3.5–5.2)
Alkaline Phosphatase: 97 U/L (ref 39–117)
BUN: 26 mg/dL — ABNORMAL HIGH (ref 6–23)
CO2: 25 meq/L (ref 19–32)
Chloride: 92 meq/L — ABNORMAL LOW (ref 96–112)
Creatinine, Ser: 1.17 mg/dL (ref 0.50–1.35)
GFR calc non Af Amer: 62 mL/min — ABNORMAL LOW (ref >90)
Potassium: 3.5 meq/L (ref 3.5–5.1)
Total Bilirubin: 1 mg/dL (ref 0.3–1.2)

## 2011-12-23 LAB — URINALYSIS, ROUTINE W REFLEX MICROSCOPIC
Bilirubin Urine: NEGATIVE
Glucose, UA: NEGATIVE mg/dL
Hgb urine dipstick: NEGATIVE
Ketones, ur: 15 mg/dL — AB
Leukocytes, UA: NEGATIVE
Nitrite: NEGATIVE
Protein, ur: NEGATIVE mg/dL
Specific Gravity, Urine: 1.02 (ref 1.005–1.030)
Urobilinogen, UA: 2 mg/dL — ABNORMAL HIGH (ref 0.0–1.0)
pH: 5.5 (ref 5.0–8.0)

## 2011-12-23 LAB — CBC WITH DIFFERENTIAL/PLATELET
Basophils Absolute: 0 10*3/uL (ref 0.0–0.1)
Basophils Relative: 0 % (ref 0–1)
Eosinophils Absolute: 0 10*3/uL (ref 0.0–0.7)
Eosinophils Relative: 0 % (ref 0–5)
HCT: 36.4 % — ABNORMAL LOW (ref 39.0–52.0)
Hemoglobin: 12.6 g/dL — ABNORMAL LOW (ref 13.0–17.0)
Lymphocytes Relative: 3 % — ABNORMAL LOW (ref 12–46)
Lymphs Abs: 0.5 K/uL — ABNORMAL LOW (ref 0.7–4.0)
MCH: 29.8 pg (ref 26.0–34.0)
MCHC: 34.6 g/dL (ref 30.0–36.0)
MCV: 86.1 fL (ref 78.0–100.0)
Monocytes Absolute: 2 K/uL — ABNORMAL HIGH (ref 0.1–1.0)
Monocytes Relative: 13 % — ABNORMAL HIGH (ref 3–12)
Neutro Abs: 12.7 K/uL — ABNORMAL HIGH (ref 1.7–7.7)
Neutrophils Relative %: 84 % — ABNORMAL HIGH (ref 43–77)
Platelets: 211 10*3/uL (ref 150–400)
RBC: 4.23 MIL/uL (ref 4.22–5.81)
RDW: 16.5 % — ABNORMAL HIGH (ref 11.5–15.5)
WBC: 15.2 K/uL — ABNORMAL HIGH (ref 4.0–10.5)

## 2011-12-23 LAB — COMPREHENSIVE METABOLIC PANEL
ALT: 15 U/L (ref 0–53)
AST: 22 U/L (ref 0–37)
Calcium: 9 mg/dL (ref 8.4–10.5)
GFR calc Af Amer: 72 mL/min — ABNORMAL LOW (ref >90)
Glucose, Bld: 174 mg/dL — ABNORMAL HIGH (ref 70–99)
Sodium: 131 mEq/L — ABNORMAL LOW (ref 135–145)
Total Protein: 6.9 g/dL (ref 6.0–8.3)

## 2011-12-23 LAB — AMMONIA: Ammonia: 31 umol/L (ref 11–60)

## 2011-12-23 LAB — APTT: aPTT: 35 s (ref 24–37)

## 2011-12-23 LAB — PROTIME-INR
INR: 1.53 — ABNORMAL HIGH (ref 0.00–1.49)
Prothrombin Time: 18.7 s — ABNORMAL HIGH (ref 11.6–15.2)

## 2011-12-23 LAB — LIPASE, BLOOD: Lipase: 28 U/L (ref 11–59)

## 2011-12-23 LAB — TROPONIN I: Troponin I: <0.3 ng/mL (ref <0.30)

## 2011-12-23 MED ORDER — SODIUM CHLORIDE 0.9 % IV SOLN
Freq: Once | INTRAVENOUS | Status: AC
  Administered 2011-12-23: 23:00:00 via INTRAVENOUS

## 2011-12-23 MED ORDER — SODIUM CHLORIDE 0.9 % IV BOLUS (SEPSIS)
500.0000 mL | Freq: Once | INTRAVENOUS | Status: AC
  Administered 2011-12-23: 500 mL via INTRAVENOUS

## 2011-12-23 NOTE — ED Provider Notes (Signed)
History     CSN: [id number]  Arrival date & time 12/23/11  1804   First MD Initiated Contact with Patient 12/23/11 1833      Chief Complaint  Patient presents with  . Dizziness  . Fall    (Consider location/radiation/quality/duration/timing/severity/associated sxs/prior treatment) HPI Comments: Level 5 caveat due to altered mentation.  Pt with vomiting on Thursday, some mild gradual global weakness.  He had a few episodes of loose BM's yesterday.  Today, was doing ok, until this afternoon, tried to get up, was so dizzy he fell onto left side.  No HA, no neck pain, denies LOC.  He has had mild chest tightness, but no different than usual.  Has had MI's and has pacer/defibrillator in place.  Has not felt it go off.  Pt is having trouble with times of when he has had symptoms based on spouse and daughter's history.  Pt denies abd pain, back at present.  No nausea at present.    Patient is a 71 y.o. male presenting with fall. The history is provided by the patient and a relative. The history is limited by the condition of the patient.  Fall    Past Medical History  Diagnosis Date  . Ischemic cardiomyopathy     s/p ICD Implantation by Dr Amil Amen  . Chronic systolic dysfunction of left ventricle     EF 30%  . Hyperlipemia   . DJD (degenerative joint disease)   . Gout   . HTN (hypertension) 02/14/2011  . Depression   . Complication of anesthesia     ONCE WITH BACK SURGERY DIFFICULT TO WAKE UP  . Angina   . Myocardial infarction 1997  . Cardiac defibrillator in situ   . DM (diabetes mellitus) 02/14/2011  . Shortness of breath on exertion   . CAD (coronary artery disease)     s/p CABG 1997, PCI (BMS) of SVG to RCA 3/09, redo CABG 02/2011 with SVG to PDA, SVG to Lcx, s/p cath 2.2013 with ocluded SVT to left circ and patent SVG to RCA  . CHF (congestive heart failure)     EF 30% by cath 05/2011  . ICD (implantable cardiac defibrillator) in place   . GERD (gastroesophageal reflux  disease)     Past Surgical History  Procedure Date  . Cardiac defibrillator placement 08/2004    initial placement, upgraded to BIV ICD by Dr Ladona Ridgel 08/24/11 (MDT)  . Knee arthrotomy ~ 1978    RIGHT KNEE CARTILAGE REMOVED  . Coronary angioplasty with stent placement 06/2007    BMS to SVG to RCA  . Coronary angioplasty 02/2011  . Coronary artery bypass graft 01/1996    CABG x 5 LIMA to LAD SVG to diag1,2,svg to om,svg to RCA  . Coronary artery bypass graft 03/06/2011    CABG X2; Procedure: REDO CORONARY ARTERY BYPASS GRAFTING (CABG);  Surgeon: Delight Ovens, MD;  Location: [facility] OR;  Service: Open Heart Surgery;  Laterality: N/A;  times two grafts using right saphenous vein harvested endoscopically.  . Cataract extraction w/ intraocular lens  implant, bilateral 2012  . Cardiac defibrillator placement 2009  . Lumbar disc surgery 2003  . Back surgery 2003    Family History  Problem Relation Age of Onset  . Coronary artery disease    . Heart failure Mother   . Heart failure Father     History  Substance Use Topics  . Smoking status: Former Smoker -- 0.5 packs/day for 15 years    Types:  Cigarettes    Quit date: 04/17/1969  . Smokeless tobacco: Never Used  . Alcohol Use: 0.6 oz/week    1 Cans of beer per week     occasional      Review of Systems  Unable to perform ROS: Mental status change    Allergies  Codeine and Latex  Home Medications   Current Outpatient Rx  Name Route Sig Dispense Refill  . ALLOPURINOL 100 MG PO TABS Oral Take 200 mg by mouth daily.     . ASPIRIN EC 81 MG PO TBEC Oral Take 81 mg by mouth daily.    . ATORVASTATIN CALCIUM 40 MG PO TABS Oral Take 40 mg by mouth daily.     Marland Kitchen CARVEDILOL 25 MG PO TABS Oral Take 25 mg by mouth 2 (two) times daily.     Marland Kitchen CLOPIDOGREL BISULFATE 75 MG PO TABS Oral Take 75 mg by mouth every morning.    . EPLERENONE 25 MG PO TABS Oral Take 25 mg by mouth daily.     . FUROSEMIDE 40 MG PO TABS Oral Take 40 mg by mouth 2  (two) times daily.    . INSULIN GLARGINE 100 UNIT/ML [place] SOLN Subcutaneous Inject 35 Units into the skin 2 (two) times daily.     . INSULIN GLULISINE 100 UNIT/ML [place] SOLN Subcutaneous Inject 40 Units into the skin 2 (two) times daily.     Marland Kitchen NIACIN ER (ANTIHYPERLIPIDEMIC) 500 MG PO TBCR Oral Take 1,000 mg by mouth 2 (two) times daily.     Marland Kitchen NITROGLYCERIN 0.4 MG SL SUBL Sublingual Place 0.4 mg under the tongue every 5 (five) minutes x 3 doses as needed. For chest pain.    Marland Kitchen RANOLAZINE ER 500 MG PO TB12 Oral Take 1,000 mg by mouth 2 (two) times daily.    . TRAMADOL HCL 50 MG PO TABS Oral Take 50 mg by mouth every 6 (six) hours as needed. For pain.    . TRIAMCINOLONE ACETONIDE 0.1 % EX CREA Topical Apply 1 application topically daily as needed. Apply sparingly to affected area externally two times a day as needed for itching and rash    . VENLAFAXINE HCL ER 150 MG PO CP24 Oral Take 150 mg by mouth daily.      Marland Kitchen ZOLPIDEM TARTRATE ER 12.5 MG PO TBCR Oral Take 12.5 mg by mouth at bedtime as needed. For sleep      BP 122/68  Pulse 81  Temp 99 F (37.2 C) (Oral)  Resp 19  SpO2 99%  Physical Exam  Nursing note and vitals reviewed. Constitutional: He appears well-developed and well-nourished. He appears ill. No distress.  HENT:  Head: Normocephalic.  Eyes: Pupils are equal, round, and reactive to light.  Cardiovascular: Normal rate, regular rhythm, intact distal pulses and normal pulses.   Occasional extrasystoles are present.  Pulmonary/Chest: Effort normal. No respiratory distress. He has no wheezes.  Abdominal: Soft. He exhibits no distension. There is no tenderness. There is no rebound and no guarding.  Musculoskeletal: He exhibits edema. He exhibits no tenderness.       Left shoulder: He exhibits tenderness. He exhibits normal range of motion, no swelling, no effusion, no crepitus, no deformity and no laceration.  Neurological: He is alert. He is disoriented. No cranial nerve deficit. He  exhibits normal muscle tone. Coordination normal. GCS eye subscore is 4. GCS verbal subscore is 4. GCS motor subscore is 6.  Skin: He is not diaphoretic. There is pallor.  Psychiatric: He  has a normal mood and affect. Judgment and thought content normal. He exhibits abnormal recent memory.    ED Course  Procedures (including critical care time)  Labs Reviewed  CBC WITH DIFFERENTIAL - Abnormal; Notable for the following:    WBC 15.2 (*)     Hemoglobin 12.6 (*)     HCT 36.4 (*)     RDW 16.5 (*)     Neutrophils Relative 84 (*)     Neutro Abs 12.7 (*)     Lymphocytes Relative 3 (*)     Lymphs Abs 0.5 (*)     Monocytes Relative 13 (*)     Monocytes Absolute 2.0 (*)     All other components within normal limits  COMPREHENSIVE METABOLIC PANEL - Abnormal; Notable for the following:    Sodium 131 (*)     Chloride 92 (*)     Glucose, Bld 174 (*)     BUN 26 (*)     Albumin 2.8 (*)     GFR calc non Af Amer 62 (*)     GFR calc Af Amer 72 (*)     All other components within normal limits  URINALYSIS, ROUTINE W REFLEX MICROSCOPIC - Abnormal; Notable for the following:    Color, Urine AMBER (*)  BIOCHEMICALS MAY BE AFFECTED BY COLOR   Ketones, ur 15 (*)     Urobilinogen, UA 2.0 (*)     All other components within normal limits  PROTIME-INR - Abnormal; Notable for the following:    Prothrombin Time 18.7 (*)     INR 1.53 (*)     All other components within normal limits  LIPASE, BLOOD  TROPONIN I  APTT  AMMONIA   Ct Head Wo Contrast  12/23/2011  *RADIOLOGY REPORT*  Clinical Data: Dizziness.  Recurrent falls.  CT HEAD WITHOUT CONTRAST  Technique:  Contiguous axial images were obtained from the base of the skull through the vertex without contrast.  Comparison: None.  Findings: There is no evidence of intracranial hemorrhage, brain edema or other signs of acute infarction.  There is no evidence of intracranial mass lesion or mass effect.  No abnormal extra-axial fluid collections are  identified.  Mild diffuse cerebral atrophy noted.  No evidence of hydrocephalus. No evidence of skull fracture or other bone abnormality.  IMPRESSION:  1.  No acute intracranial abnormality. 2.  Mild diffuse cerebral atrophy.   Original Report Authenticated By: Danae Orleans, M.D.    Dg Chest Port 1 View  12/23/2011  *RADIOLOGY REPORT*  Clinical Data: Chest pain.  Shortness of breath.  Coronary artery disease.  PORTABLE CHEST - 1 VIEW  Comparison: 08/25/2011  Findings: Low lung volumes again demonstrated, however both lungs are clear.  Mild cardiomegaly stable.  AICD remains in stable position.  Prior CABG again noted.  IMPRESSION: Low lung volumes.  No acute findings.   Original Report Authenticated By: Danae Orleans, M.D.    Dg Shoulder Left  12/23/2011  *RADIOLOGY REPORT*  Clinical Data: Fall.  Shoulder injury and pain.  LEFT SHOULDER - 2+ VIEW  Comparison: None.  Findings: No evidence of fracture or dislocation.  Mild degenerative spurring is seen involving the distal acromion process and inferior glenoid rim.  Pacemaker is also seen in the overlying chest wall soft tissues.  IMPRESSION:  1.  No acute findings. 2.  Mild degenerative spurring of the inferior glenoid and distal acromion.   Original Report Authenticated By: Danae Orleans, M.D.  1. Syncope   2. Nausea vomiting and diarrhea     ECG at time 18:10 shows paced rhythm at rate 95, occasional PVC's.  Similar in appearance to ECG from 08/25/11 except rate is faster.    11:11 PM Pt reports feeling improved, spoke to [facility] who will admit to tele bed for syncope.    MDM  Pt with mild change in mentation, appears weak, mildly pale, may be dehydrated.  [employer] rep reports no arrythmia's recently, is paced mostly ventricular.  Pt has no sig change in CP's, doubt cardiac.  Pt has had N/V/D, however abd is soft.  Na is mildly low.  Will give IVF's, check head CT, will need admission.          Gavin Pound. Anita Laguna, MD 12/23/11 [id number]

## 2011-12-23 NOTE — ED Notes (Signed)
Patient transported to X-ray and pt notified that urine sample is needed

## 2011-12-23 NOTE — ED Notes (Signed)
Pt arrives to the ED via [facility]. Pt has been dizzy since 8 this am caused him to fall about 4 hours ago. He laid in the floor for 3 hours until he crawled to the phone. He is reporting only left shoulder pain from the fall. Has extensive cardiac hx. Including a pacemaker/defibrillator. Pt is 96% on 2 L nasal cannula, denies O2 use at home.Denies feeling his defibrillator go off. Also denies LOC just states, "I just got dizzy and fell."

## 2011-12-23 NOTE — ED Notes (Signed)
EKG performed at 1810 and given to Dr. Oletta Lamas along with OLD ekg.

## 2011-12-24 ENCOUNTER — Inpatient Hospital Stay (HOSPITAL_COMMUNITY): Payer: Managed Care, Other (non HMO)

## 2011-12-24 ENCOUNTER — Encounter (HOSPITAL_COMMUNITY): Payer: Self-pay | Admitting: Internal Medicine

## 2011-12-24 DIAGNOSIS — R112 Nausea with vomiting, unspecified: Secondary | ICD-10-CM | POA: Diagnosis not present

## 2011-12-24 DIAGNOSIS — I2589 Other forms of chronic ischemic heart disease: Secondary | ICD-10-CM

## 2011-12-24 DIAGNOSIS — K810 Acute cholecystitis: Secondary | ICD-10-CM | POA: Diagnosis present

## 2011-12-24 DIAGNOSIS — R42 Dizziness and giddiness: Secondary | ICD-10-CM | POA: Diagnosis not present

## 2011-12-24 DIAGNOSIS — I10 Essential (primary) hypertension: Secondary | ICD-10-CM | POA: Diagnosis not present

## 2011-12-24 DIAGNOSIS — R197 Diarrhea, unspecified: Secondary | ICD-10-CM | POA: Diagnosis present

## 2011-12-24 DIAGNOSIS — Z01810 Encounter for preprocedural cardiovascular examination: Secondary | ICD-10-CM

## 2011-12-24 DIAGNOSIS — I2510 Atherosclerotic heart disease of native coronary artery without angina pectoris: Secondary | ICD-10-CM

## 2011-12-24 LAB — COMPREHENSIVE METABOLIC PANEL
BUN: 26 mg/dL — ABNORMAL HIGH (ref 6–23)
CO2: 27 mEq/L (ref 19–32)
Chloride: 93 mEq/L — ABNORMAL LOW (ref 96–112)
Creatinine, Ser: 1.13 mg/dL (ref 0.50–1.35)
GFR calc non Af Amer: 64 mL/min — ABNORMAL LOW (ref >90)
Glucose, Bld: 192 mg/dL — ABNORMAL HIGH (ref 70–99)
Total Bilirubin: 0.8 mg/dL (ref 0.3–1.2)

## 2011-12-24 LAB — CLOSTRIDIUM DIFFICILE BY PCR: Toxigenic C. Difficile by PCR: NEGATIVE

## 2011-12-24 LAB — CBC WITH DIFFERENTIAL/PLATELET
Eosinophils Absolute: 0.1 10*3/uL (ref 0.0–0.7)
Lymphocytes Relative: 6 % — ABNORMAL LOW (ref 12–46)
Lymphs Abs: 0.7 10*3/uL (ref 0.7–4.0)
Neutrophils Relative %: 83 % — ABNORMAL HIGH (ref 43–77)
Platelets: 217 10*3/uL (ref 150–400)
RBC: 4.27 MIL/uL (ref 4.22–5.81)
WBC: 13 10*3/uL — ABNORMAL HIGH (ref 4.0–10.5)

## 2011-12-24 LAB — CK TOTAL AND CKMB (NOT AT ARMC)
Relative Index: 2 (ref 0.0–2.5)
Total CK: 152 U/L (ref 7–232)
Total CK: 128 U/L (ref 7–232)

## 2011-12-24 LAB — MAGNESIUM: Magnesium: 1.8 mg/dL (ref 1.5–2.5)

## 2011-12-24 LAB — PRO B NATRIURETIC PEPTIDE: Pro B Natriuretic peptide (BNP): 7842 pg/mL — ABNORMAL HIGH (ref 0–125)

## 2011-12-24 LAB — TROPONIN I
Troponin I: <0.3 ng/mL (ref <0.30)
Troponin I: 0.35 ng/mL (ref <0.30)
Troponin I: <0.3 ng/mL (ref <0.30)

## 2011-12-24 LAB — GLUCOSE, CAPILLARY
Glucose-Capillary: 201 mg/dL — ABNORMAL HIGH (ref 70–99)
Glucose-Capillary: 176 mg/dL — ABNORMAL HIGH (ref 70–99)

## 2011-12-24 MED ORDER — CLOPIDOGREL BISULFATE 75 MG PO TABS
75.0000 mg | ORAL_TABLET | Freq: Every day | ORAL | Status: DC
  Administered 2011-12-24: 75 mg via ORAL
  Filled 2011-12-24 (×2): qty 1

## 2011-12-24 MED ORDER — MAGNESIUM SULFATE 40 MG/ML IJ SOLN
2.0000 g | Freq: Once | INTRAMUSCULAR | Status: AC
  Administered 2011-12-24: 2 g via INTRAVENOUS
  Filled 2011-12-24: qty 50

## 2011-12-24 MED ORDER — NIACIN ER 500 MG PO CPCR
1000.0000 mg | ORAL_CAPSULE | Freq: Two times a day (BID) | ORAL | Status: DC
  Administered 2011-12-24 – 2011-12-26 (×5): 1000 mg via ORAL
  Filled 2011-12-24 (×6): qty 2

## 2011-12-24 MED ORDER — RANOLAZINE ER 500 MG PO TB12
1000.0000 mg | ORAL_TABLET | Freq: Two times a day (BID) | ORAL | Status: DC
  Administered 2011-12-24 – 2011-12-26 (×5): 1000 mg via ORAL
  Filled 2011-12-24 (×6): qty 2

## 2011-12-24 MED ORDER — INSULIN ASPART 100 UNIT/ML ~~LOC~~ SOLN
0.0000 [IU] | Freq: Three times a day (TID) | SUBCUTANEOUS | Status: DC
  Administered 2011-12-24: 3 [IU] via SUBCUTANEOUS

## 2011-12-24 MED ORDER — ASPIRIN EC 81 MG PO TBEC
81.0000 mg | DELAYED_RELEASE_TABLET | Freq: Every day | ORAL | Status: DC
  Administered 2011-12-24: 81 mg via ORAL
  Filled 2011-12-24: qty 1

## 2011-12-24 MED ORDER — SODIUM CHLORIDE 0.9 % IV SOLN
INTRAVENOUS | Status: DC
  Administered 2011-12-24 – 2011-12-26 (×3): via INTRAVENOUS

## 2011-12-24 MED ORDER — VENLAFAXINE HCL ER 150 MG PO CP24
150.0000 mg | ORAL_CAPSULE | Freq: Every day | ORAL | Status: DC
  Administered 2011-12-24 – 2011-12-26 (×3): 150 mg via ORAL
  Filled 2011-12-24 (×3): qty 1

## 2011-12-24 MED ORDER — ACETAMINOPHEN 650 MG RE SUPP: 650.0000 mg | Freq: Four times a day (QID) | RECTAL | Status: DC | PRN

## 2011-12-24 MED ORDER — INSULIN GLARGINE 100 UNIT/ML ~~LOC~~ SOLN
20.0000 [IU] | Freq: Every day | SUBCUTANEOUS | Status: DC
  Administered 2011-12-24: 20 [IU] via SUBCUTANEOUS

## 2011-12-24 MED ORDER — INSULIN ASPART 100 UNIT/ML ~~LOC~~ SOLN
0.0000 [IU] | SUBCUTANEOUS | Status: DC
  Administered 2011-12-24 (×2): 2 [IU] via SUBCUTANEOUS
  Administered 2011-12-25: 1 [IU] via SUBCUTANEOUS
  Administered 2011-12-25: 2 [IU] via SUBCUTANEOUS
  Administered 2011-12-25: 3 [IU] via SUBCUTANEOUS
  Administered 2011-12-25: 2 [IU] via SUBCUTANEOUS
  Administered 2011-12-25: 3 [IU] via SUBCUTANEOUS
  Administered 2011-12-25: 1 [IU] via SUBCUTANEOUS

## 2011-12-24 MED ORDER — CIPROFLOXACIN IN D5W 400 MG/200ML IV SOLN
400.0000 mg | Freq: Two times a day (BID) | INTRAVENOUS | Status: DC
  Administered 2011-12-24 – 2011-12-26 (×4): 400 mg via INTRAVENOUS
  Filled 2011-12-24 (×5): qty 200

## 2011-12-24 MED ORDER — IOHEXOL 300 MG/ML  SOLN
120.0000 mL | Freq: Once | INTRAMUSCULAR | Status: AC | PRN
  Administered 2011-12-24: 120 mL via INTRAVENOUS

## 2011-12-24 MED ORDER — NIACIN ER (ANTIHYPERLIPIDEMIC) 500 MG PO TBCR: 1000.0000 mg | EXTENDED_RELEASE_TABLET | Freq: Two times a day (BID) | ORAL | Status: DC

## 2011-12-24 MED ORDER — ATORVASTATIN CALCIUM 40 MG PO TABS
40.0000 mg | ORAL_TABLET | Freq: Every day | ORAL | Status: DC
Start: 2011-12-24 — End: 2011-12-26
  Administered 2011-12-24 – 2011-12-25 (×2): 40 mg via ORAL
  Filled 2011-12-24 (×3): qty 1

## 2011-12-24 MED ORDER — NITROGLYCERIN 0.4 MG SL SUBL: 0.4000 mg | SUBLINGUAL_TABLET | SUBLINGUAL | Status: DC | PRN

## 2011-12-24 MED ORDER — ENOXAPARIN SODIUM 40 MG/0.4ML ~~LOC~~ SOLN
40.0000 mg | SUBCUTANEOUS | Status: DC
  Filled 2011-12-24: qty 0.4

## 2011-12-24 MED ORDER — SODIUM CHLORIDE 0.9 % IJ SOLN
3.0000 mL | Freq: Two times a day (BID) | INTRAMUSCULAR | Status: DC
  Administered 2011-12-24 – 2011-12-26 (×5): 3 mL via INTRAVENOUS

## 2011-12-24 MED ORDER — IOHEXOL 300 MG/ML  SOLN
20.0000 mL | INTRAMUSCULAR | Status: AC
  Administered 2011-12-24 (×2): 20 mL via ORAL

## 2011-12-24 MED ORDER — ALLOPURINOL 100 MG PO TABS
200.0000 mg | ORAL_TABLET | Freq: Every day | ORAL | Status: DC
  Administered 2011-12-24 – 2011-12-26 (×3): 200 mg via ORAL
  Filled 2011-12-24 (×3): qty 2

## 2011-12-24 MED ORDER — METRONIDAZOLE IN NACL 5-0.79 MG/ML-% IV SOLN
500.0000 mg | Freq: Three times a day (TID) | INTRAVENOUS | Status: DC
  Administered 2011-12-24 – 2011-12-25 (×3): 500 mg via INTRAVENOUS
  Filled 2011-12-24 (×6): qty 100

## 2011-12-24 MED ORDER — CARVEDILOL 25 MG PO TABS
25.0000 mg | ORAL_TABLET | Freq: Two times a day (BID) | ORAL | Status: DC
  Administered 2011-12-24 – 2011-12-26 (×5): 25 mg via ORAL
  Filled 2011-12-24 (×7): qty 1

## 2011-12-24 MED ORDER — ACETAMINOPHEN 325 MG PO TABS
650.0000 mg | ORAL_TABLET | Freq: Four times a day (QID) | ORAL | Status: DC | PRN
  Administered 2011-12-24 – 2011-12-25 (×5): 650 mg via ORAL
  Filled 2011-12-24 (×5): qty 2

## 2011-12-24 MED ORDER — SODIUM CHLORIDE 0.9 % IV SOLN: INTRAVENOUS | Status: DC

## 2011-12-24 MED ORDER — ONDANSETRON HCL 4 MG/2ML IJ SOLN
4.0000 mg | Freq: Four times a day (QID) | INTRAMUSCULAR | Status: DC | PRN
  Administered 2011-12-25: 4 mg via INTRAVENOUS
  Filled 2011-12-24: qty 2

## 2011-12-24 MED ORDER — ZOLPIDEM TARTRATE 5 MG PO TABS
5.0000 mg | ORAL_TABLET | Freq: Every evening | ORAL | Status: DC | PRN
  Administered 2011-12-24 – 2011-12-25 (×2): 5 mg via ORAL
  Filled 2011-12-24 (×2): qty 1

## 2011-12-24 MED ORDER — INSULIN GLARGINE 100 UNIT/ML ~~LOC~~ SOLN
15.0000 [IU] | Freq: Every day | SUBCUTANEOUS | Status: DC
  Administered 2011-12-24 – 2011-12-25 (×2): 15 [IU] via SUBCUTANEOUS

## 2011-12-24 MED ORDER — ONDANSETRON HCL 4 MG PO TABS: 4.0000 mg | ORAL_TABLET | Freq: Four times a day (QID) | ORAL | Status: DC | PRN

## 2011-12-24 MED ORDER — POTASSIUM CHLORIDE CRYS ER 20 MEQ PO TBCR
40.0000 meq | EXTENDED_RELEASE_TABLET | ORAL | Status: AC
  Administered 2011-12-24 (×2): 40 meq via ORAL
  Filled 2011-12-24: qty 2

## 2011-12-24 NOTE — Progress Notes (Signed)
Pt had slightly elevated troponin, repeat enzymes and CKMBs pending. His BNP is higher than usual. His ECG is not acute. Reviewed data with Dr Johney Frame. He had no SOB and DOE is at baseline. He is not significantly volume overloaded by exam and his PO intake is poor right now due to his acute illness. He is on I/O and will add daily weights. I/O should be followed carefully, home Lasix is on hold. Can give Lasix PRN or restart home Rx after surgery. MD to make final recommendations in am, once all data reviewed.

## 2011-12-24 NOTE — Progress Notes (Signed)
[facility] PROGRESS NOTE  [name] GNF:[id number] DOB: 1940-07-11 DOA: 12/23/2011 PCP: Lupita Raider, MD  Assessment/Plan: Principal Problem:  *Acute cholecystitis Active Problems:  Ischemic cardiomyopathy  CAD (coronary artery disease)  Nausea vomiting and diarrhea  Dizziness  Preop cardiovascular exam  1. Possible Acute cholecystitis: General surgery consulted. We'll request right upper quadrant ultrasound. Keep patient n.p.o. except meds. [facility] cardiology consulted for preop cardiology clearance. 2. Nausea, vomiting, diarrhea and abdominal pain: Possibly secondary to problem #1. N.p.o. Gentle hydration and monitor. 3. CAD status post CABG/ischemic cardiomyopathy/pacemaker/AICD: Patient denies chest pain. Dyspnea at baseline. Cardiac enzymes being cycled. [facility] cardiology consulted. Continue carvedilol and Ranexa. 4. Diabetes mellitus type 2: Reduced bedtime Lantus and change sliding scale to every 4 hours. 5. Hypokalemia: Replete and aim to keep greater than 4. 6. NSVT: Patient has AICD. Keep mag greater than 2 and potassium greater than 4. Continue carvedilol. 7. Hyponatremia: Possibly multifactorial secondary to congestive heart failure and dehydration. 8. Orthostatic hypotension:? Secondary to dehydration. Brief gentle IV fluids. Lasix temporarily held. 9. Chronic systolic congestive heart failure: Clinically compensated. Lasix temporarily on hold. Monitor volume status closely.  Code Status: Full Family Communication: Discussed with spouse at the bedside in detail, updated care and answered questions. Disposition Plan: Home when stable.   Brief narrative: 71 year old male with history of ischemic cardiomyopathy last EF measured was 30%, diabetes mellitus type 2, hypertension, AICD placement presented the ER because of nausea vomiting and dizziness. Patient has been having nausea and vomiting with diarrhea last 2-3 days. Denies any use of antibiotics or recent  hospitalization. In addition patient also complains of right lower quadrant pain. Yesterday patient felt weak and dizzy but it was standing. In the ER patient was found to be orthostatic. Was given IV fluid bolus and has been admitted for further management   Consultants:  General surgery.  [facility].  Procedures:  None  Antibiotics:  IV Cipro and Flagyl 9/8  HPI/Subjective: Patient says he's feeling better-stronger. Currently denies nausea, vomiting, abdominal pain or chest pain. Diarrhea persists.  Objective: Filed Vitals:   12/24/11 0050 12/24/11 0126 12/24/11 0525 12/24/11 1509  BP:  121/68 111/55 111/69  Pulse:  83 99 66  Temp: 98.8 F (37.1 C) 98.6 F (37 C) 99.6 F (37.6 C) 97.1 F (36.2 C)  TempSrc:  Oral Oral Oral  Resp:  21 20 20   Height:  5\' 9"  (1.753 m)    Weight:  100.88 kg (222 lb 6.4 oz)    SpO2:  96% 96% 94%    Intake/Output Summary (Last 24 hours) at 12/24/11 1734 Last data filed at 12/24/11 1246  Gross per 24 hour  Intake    240 ml  Output      6 ml  Net    234 ml   Filed Weights   12/24/11 0126  Weight: 100.88 kg (222 lb 6.4 oz)    Exam:  General exam: Moderately built and nourished male, in no obvious distress. Respiratory system: Clear to auscultation. No increased work of breathing. Cardiovascular system: S1 and S2 heard, regular rate and rhythm. No JVD, murmurs or gallops. Trace bilateral leg edema. Telemetry shows paced rhythm with episodes of NSVT. Gastrointestinal system: Abdomen is obese, soft and nontender at this time without any rigidity guarding or rebound. No organomegaly or masses appreciated. Bowel sounds are normally heard. Central nervous system: Alert and oriented. No focal deficits. Extremities: Symmetric 5 x 5 power.   Data Reviewed: Basic Metabolic Panel:  Lab 12/24/11 8469  12/24/11 0500 12/23/11 1930  NA -- 131* 131*  K -- 3.3* 3.5  CL -- 93* 92*  CO2 -- 27 25  GLUCOSE -- 192* 174*  BUN -- 26* 26*    CREATININE -- 1.13 1.17  CALCIUM -- 8.9 9.0  MG 1.8 -- --  PHOS -- -- --   Liver Function Tests:  Lab 12/24/11 0500 12/23/11 1930  AST 25 22  ALT 16 15  ALKPHOS 101 97  BILITOT 0.8 1.0  PROT 6.7 6.9  ALBUMIN 2.5* 2.8*    Lab 12/23/11 1930  LIPASE 28  AMYLASE --    Lab 12/23/11 2046  AMMONIA 31   CBC:  Lab 12/24/11 0500 12/23/11 1930  WBC 13.0* 15.2*  NEUTROABS 10.7* 12.7*  HGB 12.6* 12.6*  HCT 36.8* 36.4*  MCV 86.2 86.1  PLT 217 211   Cardiac Enzymes:  Lab 12/24/11 1250 12/24/11 1100 12/24/11 0153 12/23/11 1924  CKTOTAL -- -- -- --  CKMB -- -- -- --  CKMBINDEX -- -- -- --  TROPONINI 0.35* <0.30 <0.30 <0.30   BNP (last 3 results)  Basename 12/24/11 1100 08/21/11 2118 06/05/11 2021  PROBNP 7842.0* 3265.0* 2501.0*   CBG:  Lab 12/24/11 1625 12/24/11 1045 12/24/11 0613 12/24/11 0158  GLUCAP 176* 215* 175* 201*    Recent Results (from the past 240 hour(s))  CLOSTRIDIUM DIFFICILE BY PCR     Status: Normal   Collection Time   12/24/11  8:15 AM      Component Value Range Status Comment   C difficile by pcr NEGATIVE  NEGATIVE Final      Studies: Ct Head Wo Contrast  12/23/2011  *RADIOLOGY REPORT*  Clinical Data: Dizziness.  Recurrent falls.  CT HEAD WITHOUT CONTRAST  Technique:  Contiguous axial images were obtained from the base of the skull through the vertex without contrast.  Comparison: None.  Findings: There is no evidence of intracranial hemorrhage, brain edema or other signs of acute infarction.  There is no evidence of intracranial mass lesion or mass effect.  No abnormal extra-axial fluid collections are identified.  Mild diffuse cerebral atrophy noted.  No evidence of hydrocephalus. No evidence of skull fracture or other bone abnormality.  IMPRESSION:  1.  No acute intracranial abnormality. 2.  Mild diffuse cerebral atrophy.   Original Report Authenticated By: Danae Orleans, M.D.    Ct Abdomen Pelvis W Contrast  12/24/2011  *RADIOLOGY REPORT*  Clinical  Data: Nausea, vomiting, diarrhea and abdominal pain.  CT ABDOMEN AND PELVIS WITH CONTRAST  Technique:  Multidetector CT imaging of the abdomen and pelvis was performed following the standard protocol during bolus administration of intravenous contrast.  Contrast: OMNIPAQUE IOHEXOL 300 MG/ML  SOLN  Comparison: CT of the abdomen and pelvis 05/16/2009.  Findings:  Lung Bases: Cardiomegaly with left ventricular dilatation. Atherosclerotic calcifications in the left anterior descending, left circumflex and right coronary arteries.  Probable coronary artery stent to the right coronary artery.  Status post median sternotomy.  Pacemaker wires are seen extending into the right atrium, right ventricular apex, and into the coronary veins overlying the left lateral ventricle via the coronary sinus. There is no significant pericardial fluid, thickening or pericardial calcification.  Abdomen/Pelvis:  The gallbladder is distended, demonstrates some diffuse wall thickening and has some pericholecystic inflammatory changes, concerning for acute cholecystitis.  No radiopaque gallstones are confidently identified.  The common bile duct does not appear dilated.  The intrahepatic biliary tree also does not appear to be dilated.  There is a slightly shrunken and nodular contour to the liver, which suggests early morphologic changes of cirrhosis.  Portal vein does appear dilated (19 mm in diameter), suggesting some degree of portal hypertension.  The enhanced appearance of the pancreas, spleen and bilateral adrenal glands is unremarkable.  There are multifocal areas of parenchymal thinning in the kidneys bilaterally (left greater than right), likely represent areas of post infectious/inflammatory scarring.  There is extensive atherosclerosis of the abdominal and pelvic vasculature, without evidence of aneurysm or dissection. There is a small amount of soft tissue stranding which extends down the right side of the retroperitoneum,  and a trace amount of reactive fluid in the right pericolic gutter, all of which appear to be related to the inflammatory changes around the gallbladder. The no larger volume of ascites.  No pneumoperitoneum.  No pathologic distension of bowel.  Normal appendix.  Numerous colonic diverticula, including diverticulae in the region of the hepatic flexure of the colon.  The inflammatory changes associated with the gallbladder are intimately associated with the hepatic flexure of the colon, but the inflammatory changes do not appear to have their epicenter in the region of the hepatic flexure of the colon.  The remainder of the colonic diverticula show no evidence of inflammatory changes to suggest acute diverticulitis at this time. Mild thickening of the urinary bladder wall. The prostate gland appears mildly enlarged.  Musculoskeletal: Clips in the right inguinal region, likely from prior vascular access. There are no aggressive appearing lytic or blastic lesions noted in the visualized portions of the skeleton.  IMPRESSION: 1.  Moderate distension of the gallbladder with gallbladder wall thickening and pericholecystic inflammatory changes highly suspicious for acute cholecystitis.  No radiopaque gallstones are identified, however, and there are numerous colonic diverticula in the adjacent hepatic flexure of the colon.  Overall, the findings are strongly favored to represent acute cholecystitis, but further evaluation with right upper quadrant ultrasound may be warranted to confirm this diagnosis, as an alternative diagnosis would be an acute hepatic flexure diverticulitis (not strongly favored). 2.  Extensive atherosclerosis, including at least three vessel coronary artery disease. 3.  Thickening of the urinary bladder wall.  Given mild prostate enlargement, this may be related to bladder outlet obstruction. However, correlation for signs and symptoms of urinary tract infection may be warranted. 4.  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   Original Report Authenticated By: Florencia Reasons, M.D.    Dg Chest Port 1 View  12/24/2011  *RADIOLOGY REPORT*  Clinical Data: Well CHF  PORTABLE CHEST - 1 VIEW  Comparison: Prior chest x-ray yesterday, 12/23/2011  Findings: Low lung volumes creates crowding of the pulmonary vasculature.  There may be mild pulmonary vascular congestion bordering on interstitial edema incrementally increased compared to yesterday.  Cardiomegaly is unchanged.  Left subclavian approach biventricular cardiac rhythm maintenance device in unchanged position.  Surgical changes of prior median sternotomy for multivessel CABG.  IMPRESSION:  1.  Mild pulmonary vascular congestion bordering on interstitial edema, incrementally increased compared to yesterday. 2.  Unchanged cardiomegaly.   Original Report Authenticated By: Alvino Blood Chest Port 1 View  12/23/2011  *RADIOLOGY REPORT*  Clinical Data: Chest pain.  Shortness of breath.  Coronary artery disease.  PORTABLE CHEST - 1 VIEW  Comparison: 08/25/2011  Findings: Low lung volumes again demonstrated, however both lungs are clear.  Mild cardiomegaly stable.  AICD remains in stable position.  Prior CABG again noted.  IMPRESSION:  Low lung volumes.  No acute findings.   Original Report Authenticated By: Danae Orleans, M.D.    Dg Shoulder Left  12/23/2011  *RADIOLOGY REPORT*  Clinical Data: Fall.  Shoulder injury and pain.  LEFT SHOULDER - 2+ VIEW  Comparison: None.  Findings: No evidence of fracture or dislocation.  Mild degenerative spurring is seen involving the distal acromion process and inferior glenoid rim.  Pacemaker is also seen in the overlying chest wall soft tissues.  IMPRESSION:  1.  No acute findings. 2.  Mild degenerative spurring of the inferior glenoid and distal acromion.   Original Report Authenticated By: Danae Orleans, M.D.     Scheduled Meds:    . sodium chloride   Intravenous Once  .  allopurinol  200 mg Oral Daily  . aspirin EC  81 mg Oral Daily  . atorvastatin  40 mg Oral q1800  . carvedilol  25 mg Oral BID WC  . ciprofloxacin  400 mg Intravenous Q12H  . clopidogrel  75 mg Oral Q breakfast  . insulin aspart  0-9 Units Subcutaneous Q4H  . insulin glargine  15 Units Subcutaneous QHS  . iohexol  20 mL Oral Q1 Hr x 2  . magnesium sulfate 1 - 4 g bolus IVPB  2 g Intravenous Once  . metronidazole  500 mg Intravenous Q8H  . niacin  1,000 mg Oral BID  . potassium chloride  40 mEq Oral Q4H  . ranolazine  1,000 mg Oral BID  . sodium chloride  500 mL Intravenous Once  . sodium chloride  500 mL Intravenous Once  . sodium chloride  3 mL Intravenous Q12H  . venlafaxine XR  150 mg Oral Daily  . DISCONTD: sodium chloride   Intravenous STAT  . DISCONTD: enoxaparin (LOVENOX) injection  40 mg Subcutaneous Q24H  . DISCONTD: insulin aspart  0-15 Units Subcutaneous TID WC  . DISCONTD: insulin glargine  20 Units Subcutaneous QHS  . DISCONTD: niacin  1,000 mg Oral BID   Continuous Infusions:    . sodium chloride 40 mL/hr at 12/24/11 0841     [facility]  [facility] Pager [phone]. If 8PM-8AM, please contact night-coverage at [email or web address], password [email or web address] 12/24/2011, 5:34 PM  LOS: 1 day

## 2011-12-24 NOTE — Progress Notes (Signed)
RN called to room. Pt c/o SOB at rest. Lung sounds are diminished breathing slightly labored, O2 sats on Room Air 92%. 2L O2 placed on pt.O2 sats increased to  99%.  States he is feeling better breathing regularly. NP made aware of above new orders given and followed. Will cont to monitor. [name]

## 2011-12-24 NOTE — Consult Note (Signed)
CARDIOLOGY CONSULT NOTE   Patient ID: [name] MRN: [id number] DOB/AGE: Sep 24, 1940 71 y.o.  Admit date: 12/23/2011  Primary Physician   Lupita Raider, MD Primary Cardiologist  TT/JA Reason for Consultation   Pre0op Eval  ION:GEXBMW [name] is a 71 y.o. male with a history of CAD.  He has been doing well, last seen by Dr Johney Frame on 12/04/2011. 4 days ago, he developed N&V, diarrhea and abdominal pain. He thought it was a GI bug, but when symptoms did not resolve, he came to the hospital and was diagnosed with acute cholecystitis. He is being considered for surgery and cardiology was asked to evaluate him.  [name] has otherwise been doing well. He has not been getting chest pain or SOB. He has chronic DOE that has not changed recently. His weight has been stable and he does not complain of LE edema, PND or orthopnea. He is compliant with medications and dietary restrictions. Untill this acute illness, he was getting along well.   Past Medical History  Diagnosis Date  . Ischemic cardiomyopathy     s/p ICD Implantation by Dr Amil Amen  . Chronic systolic dysfunction of left ventricle     EF 30%  . Hyperlipemia   . DJD (degenerative joint disease)   . Gout   . HTN (hypertension) 02/14/2011  . Depression   . Complication of anesthesia     ONCE WITH BACK SURGERY DIFFICULT TO WAKE UP  . Angina   . Myocardial infarction 1997  . Cardiac defibrillator in situ   . DM (diabetes mellitus) 02/14/2011  . Shortness of breath on exertion   . CAD (coronary artery disease)     s/p CABG 1997, PCI (BMS) of SVG to RCA 3/09, redo CABG 02/2011 with SVG to PDA, SVG to Lcx, s/p cath 2.2013 with ocluded SVT to left circ and patent SVG to RCA  . CHF (congestive heart failure)     EF 30% by cath 05/2011  . ICD (implantable cardiac defibrillator) in place   . GERD (gastroesophageal reflux disease)      Past Surgical History  Procedure Date  . Cardiac defibrillator placement 08/2004    initial  placement, upgraded to BIV ICD by Dr Ladona Ridgel 08/24/11 (MDT)  . Knee arthrotomy ~ 1978    RIGHT KNEE CARTILAGE REMOVED  . Coronary angioplasty with stent placement 06/2007    BMS to SVG to RCA  . Coronary angioplasty 02/2011  . Coronary artery bypass graft 01/1996    CABG x 5 LIMA to LAD SVG to diag1,2,svg to om,svg to RCA  . Coronary artery bypass graft 03/06/2011    CABG X2; Procedure: REDO CORONARY ARTERY BYPASS GRAFTING (CABG);  Surgeon: Delight Ovens, MD;  Location[facility] OR;  Service: Open Heart Surgery;  Laterality: N/A;  times two grafts using right saphenous vein harvested endoscopically.  . Cataract extraction w/ intraocular lens  implant, bilateral 2012  . Cardiac defibrillator placement 2009  . Lumbar disc surgery 2003  . Back surgery 2003    Allergies  Allergen Reactions  . Codeine Nausea And Vomiting  . Latex Rash    Specifies adhesive     I have reviewed the patient's current medications    . sodium chloride   Intravenous Once  . allopurinol  200 mg Oral Daily  . aspirin EC  81 mg Oral Daily  . atorvastatin  40 mg Oral q1800  . carvedilol  25 mg Oral BID WC  . ciprofloxacin  400 mg  Intravenous Q12H  . clopidogrel  75 mg Oral Q breakfast  . insulin aspart  0-9 Units Subcutaneous Q4H  . insulin glargine  15 Units Subcutaneous QHS  . iohexol  20 mL Oral Q1 Hr x 2  . magnesium sulfate 1 - 4 g bolus IVPB  2 g Intravenous Once  . metronidazole  500 mg Intravenous Q8H  . niacin  1,000 mg Oral BID  . potassium chloride  40 mEq Oral Q4H  . ranolazine  1,000 mg Oral BID  . sodium chloride  500 mL Intravenous Once  . sodium chloride  500 mL Intravenous Once  . sodium chloride  3 mL Intravenous Q12H  . venlafaxine XR  150 mg Oral Daily  . DISCONTD: sodium chloride   Intravenous STAT  . DISCONTD: enoxaparin (LOVENOX) injection  40 mg Subcutaneous Q24H  . DISCONTD: insulin aspart  0-15 Units Subcutaneous TID WC  . DISCONTD: insulin glargine  20 Units Subcutaneous QHS  .  DISCONTD: niacin  1,000 mg Oral BID      . sodium chloride 40 mL/hr at 12/24/11 0841   acetaminophen, acetaminophen, iohexol, nitroGLYCERIN, ondansetron (ZOFRAN) IV, ondansetron, zolpidem  Medication Sig  allopurinol (ZYLOPRIM) 100 MG tablet Take 200 mg by mouth daily.   aspirin EC 81 MG tablet Take 81 mg by mouth daily.  atorvastatin (LIPITOR) 40 MG tablet Take 40 mg by mouth daily.   carvedilol (COREG) 25 MG tablet Take 25 mg by mouth 2 (two) times daily.   clopidogrel (PLAVIX) 75 MG tablet Take 75 mg by mouth every morning.  eplerenone (INSPRA) 25 MG tablet Take 25 mg by mouth daily.   furosemide (LASIX) 40 MG tablet Take 40 mg by mouth 2 (two) times daily.  insulin glargine (LANTUS) 100 UNIT/ML injection Inject 35 Units into the skin 2 (two) times daily.   insulin glulisine (APIDRA) 100 UNIT/ML injection Inject 40 Units into the skin 2 (two) times daily.   niacin (NIASPAN) 500 MG CR tablet Take 1,000 mg by mouth 2 (two) times daily.   nitroGLYCERIN (NITROSTAT) 0.4 MG SL tablet Place 0.4 mg under the tongue every 5 (five) minutes x 3 doses as needed. For chest pain.  ranolazine (RANEXA) 500 MG 12 hr tablet Take 1,000 mg by mouth 2 (two) times daily.  traMADol (ULTRAM) 50 MG tablet Take 50 mg by mouth every 6 (six) hours as needed. For pain.  triamcinolone (KENALOG) 0.1 % cream  Apply sparingly to affected area externally two times a day as needed for itching and rash  venlafaxine (EFFEXOR-XR) 150 MG 24 hr capsule Take 150 mg by mouth daily.    zolpidem (AMBIEN CR) 12.5 MG CR tablet Take 12.5 mg by mouth at bedtime as needed. sleep     History   Social History  . Marital Status: Married    Spouse Name: N/A    Number of Children: N/A  . Years of Education: N/A   Occupational History  . Not on file.   Social History Main Topics  . Smoking status: Former Smoker -- 0.5 packs/day for 15 years    Types: Cigarettes    Quit date: 04/17/1969  . Smokeless tobacco: Never Used  .  Alcohol Use: 0.6 oz/week    1 Cans of beer per week     occasional  . Drug Use: No  . Sexually Active: No   Other Topics Concern  . 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  He collects antique metal toys    Family Status  Relation Status Death Age  . Mother Deceased 76  . Father Deceased 36  . Brother Deceased 48s    sudden death, presumed cardiac  . Brother Deceased 23    had Cancer and hx MI.   Family History  Problem Relation Age of Onset  . Coronary artery disease    . Heart failure Mother   . Heart failure Father   . Heart attack Mother   . Heart attack Father    ROS: No fevers or chills, no cough or URI symptoms. No blood in diarrhea. Denies orthostatic symptoms. Full 14 point review of systems complete and found to be negative unless listed above.  Physical Exam: Blood pressure 111/55, pulse 99, temperature 99.6 F (37.6 C), temperature source Oral, resp. rate 20, height 5\' 9"  (1.753 m), weight 222 lb 6.4 oz (100.88 kg), SpO2 96.00%.  General: Well developed, well nourished, male in no acute distress Head: Eyes PERRLA, No xanthomas.   Normocephalic and atraumatic, oropharynx without edema or exudate. Dentition: poor Lungs: few bilateral basilar rales, no crackles or wheeze. Heart: HRRR S1 S2, no rub/gallop, 2-3/6 murmur. pulses are 2+ all 4 extrem.   Neck: No carotid bruits. No lymphadenopathy.  JVD not elevated. Abdomen: Bowel sounds present, abdomen soft and tender without masses or hernias noted. Msk:  No spine or cva tenderness. No weakness, no joint deformities or effusions. Extremities: No clubbing or cyanosis. No edema.  Neuro: Alert and oriented X 3. No focal deficits noted. Psych:  Good affect, responds appropriately Skin: No rashes or lesions noted.  Labs:   Lab Results  Component Value Date   WBC 13.0* 12/24/2011   HGB 12.6* 12/24/2011   HCT 36.8* 12/24/2011   MCV 86.2 12/24/2011    PLT 217 12/24/2011    Basename 12/23/11 1930  INR 1.53*    Lab 12/24/11 0500  NA 131*  Brandon 3.3*  CL 93*  CO2 27  BUN 26*  CREATININE 1.13  CALCIUM 8.9  PROT 6.7  BILITOT 0.8  ALKPHOS 101  ALT 16  AST 25  GLUCOSE 192*   Magnesium  Date Value Range Status  12/24/2011 1.8  1.5 - 2.5 mg/dL Final    Basename 16/10/96 1100 12/24/11 0153 12/23/11 1924  CKTOTAL -- -- --  CKMB -- -- --  TROPONINI <0.30 <0.30 <0.30   Pro B Natriuretic peptide (BNP)  Date/Time Value Range Status  12/24/2011 11:00 AM 7842.0* 0 - 125 pg/mL Final  08/21/2011  9:18 PM 3265.0* 0 - 125 pg/mL Final   Lipase  Date/Time Value Range Status  12/23/2011  7:30 PM 28  11 - 59 U/L Final   Cardiac Cath: 06/06/2011 The left anterior descending artery is patent in its proximal portion giving rise to a small first diagonal. The LAD is then occluded..  The left circumflex artery is patent in its proximal portions giving rise to a small first OM branch and a second OM branch. At the takeoff of the OM2 there is an 80% narrowing of the left circumflex. The ongoing circumflex traverses the AV groove and gives rise to a large OM3. .  The right coronary artery is is occluded proximally.  The LIMA to the LAD graft is widely patent with evidence of distal LAD flow and patent distal LAD.  The SVG to the diagonal is chronically occluded.  The SVG to the OM is chronically occluded.  There is an SVG  to left circumflex that was grafted via the hood of the old SVG to diagonal and is now occluded.  There is an old SVG to RCA with an 80% lesion proximal to the insertion of the RCA and via the same hood a new SVG to the PDA is present which is mildly atretic but widely patent.  LEFT VENTRICULOGRAM: Left ventricular angiogram was not performed due to elevated LVEDP  IMPRESSIONS:  1. 80% distal left main coronary artery stenosis. 2. Occluded mid LAD with good distal flow via LIMA graft. 3. 80% stenosis of mid left circumflex at takeoff of  OM2. 4. Proximal occlusion of RCA. 5. Patent LIMA to LAD with good distal flow in distal LAD  6. Occluded SVG to left circumflex  7. Mildly atretic but widely patent SVG to PDA with 80% stenosis of old SVG to RCA at anastamosis  8. Chronically occluded SVG to diagonal  9. Chronically occluded SVG to OM  10. Occluded new SVG to left circumfllex  11. Severe left ventricular systolic dy function by echo in past EF 30%. LVEDP 34 mmHg.  RECOMMENDATION:  1. Medical therapy of CAD with nitrates/ASA/Plavix/beta blockers  Echo: 06/06/2011 Study Conclusions - Left ventricle: The cavity size was normal. Wall thickness was increased in a pattern of mild LVH. Systolic function was severely reduced. The estimated ejection fraction was in the range of 25% to 30%. There was a reduced contribution of atrial contraction to ventricular filling, due to increased ventricular diastolic pressure or atrial contractile dysfunction. Doppler parameters are consistent with a reversible restrictive pattern, indicative of decreased left ventricular diastolic compliance and/or increased left atrial pressure (grade 3 diastolic dysfunction). - Mitral valve: Calcified annulus. Mild regurgitation. - Left atrium: The atrium was moderately dilated.   ECG: 23-Dec-2011 18:10:01   VENTRICULAR-PACED COMPLEXES ~ other complexes also detected RIGHT BUNDLE BRANCH BLOCK ~ QRSd>120, terminal axis(90,270) Abnormal ECG 59mm/s 83mm/mV 150Hz  8.0.1 12SL 235 CID: [id number] Referred by: Confirmed By: Greta Doom MD Vent. rate 95 BPM PR interval 137 ms QRS duration 138 ms QT/QTc 400/503 ms P-R-T axes 0 -82 87   Radiology:  Ct Head Wo Contrast 12/23/2011  *RADIOLOGY REPORT*  Clinical Data: Dizziness.  Recurrent falls.  CT HEAD WITHOUT CONTRAST  Technique:  Contiguous axial images were obtained from the base of the skull through the vertex without contrast.  Comparison: None.  Findings: There is no evidence of intracranial hemorrhage,  brain edema or other signs of acute infarction.  There is no evidence of intracranial mass lesion or mass effect.  No abnormal extra-axial fluid collections are identified.  Mild diffuse cerebral atrophy noted.  No evidence of hydrocephalus. No evidence of skull fracture or other bone abnormality.  IMPRESSION:  1.  No acute intracranial abnormality. 2.  Mild diffuse cerebral atrophy.   Original Report Authenticated By: Danae Orleans, M.D.    Ct Abdomen Pelvis W Contrast 12/24/2011  *RADIOLOGY REPORT*  Clinical Data: Nausea, vomiting, diarrhea and abdominal pain.  CT ABDOMEN AND PELVIS WITH CONTRAST  Technique:  Multidetector CT imaging of the abdomen and pelvis was performed following the standard protocol during bolus administration of intravenous contrast.  Contrast: OMNIPAQUE IOHEXOL 300 MG/ML  SOLN  Comparison: CT of the abdomen and pelvis 05/16/2009.  Findings:  Lung Bases: Cardiomegaly with left ventricular dilatation. Atherosclerotic calcifications in the left anterior descending, left circumflex and right coronary arteries.  Probable coronary artery stent to the right coronary artery.  Status post median sternotomy.  Pacemaker wires are seen  extending into the right atrium, right ventricular apex, and into the coronary veins overlying the left lateral ventricle via the coronary sinus. There is no significant pericardial fluid, thickening or pericardial calcification.  Abdomen/Pelvis:  The gallbladder is distended, demonstrates some diffuse wall thickening and has some pericholecystic inflammatory changes, concerning for acute cholecystitis.  No radiopaque gallstones are confidently identified.  The common bile duct does not appear dilated.  The intrahepatic biliary tree also does not appear to be dilated.  There is a slightly shrunken and nodular contour to the liver, which suggests early morphologic changes of cirrhosis.  Portal vein does appear dilated (19 mm in diameter), suggesting some degree of  portal hypertension.  The enhanced appearance of the pancreas, spleen and bilateral adrenal glands is unremarkable.  There are multifocal areas of parenchymal thinning in the kidneys bilaterally (left greater than right), likely represent areas of post infectious/inflammatory scarring.  There is extensive atherosclerosis of the abdominal and pelvic vasculature, without evidence of aneurysm or dissection. There is a small amount of soft tissue stranding which extends down the right side of the retroperitoneum, and a trace amount of reactive fluid in the right pericolic gutter, all of which appear to be related to the inflammatory changes around the gallbladder. The no larger volume of ascites.  No pneumoperitoneum.  No pathologic distension of bowel.  Normal appendix.  Numerous colonic diverticula, including diverticulae in the region of the hepatic flexure of the colon.  The inflammatory changes associated with the gallbladder are intimately associated with the hepatic flexure of the colon, but the inflammatory changes do not appear to have their epicenter in the region of the hepatic flexure of the colon.  The remainder of the colonic diverticula show no evidence of inflammatory changes to suggest acute diverticulitis at this time. Mild thickening of the urinary bladder wall. The prostate gland appears mildly enlarged.  Musculoskeletal: Clips in the right inguinal region, likely from prior vascular access. There are no aggressive appearing lytic or blastic lesions noted in the visualized portions of the skeleton.  IMPRESSION: 1.  Moderate distension of the gallbladder with gallbladder wall thickening and pericholecystic inflammatory changes highly suspicious for acute cholecystitis.  No radiopaque gallstones are identified, however, and there are numerous colonic diverticula in the adjacent hepatic flexure of the colon.  Overall, the findings are strongly favored to represent acute cholecystitis, but further  evaluation with right upper quadrant ultrasound may be warranted to confirm this diagnosis, as an alternative diagnosis would be an acute hepatic flexure diverticulitis (not strongly favored). 2.  Extensive atherosclerosis, including at least three vessel coronary artery disease. 3.  Thickening of the urinary bladder wall.  Given mild prostate enlargement, this may be related to bladder outlet obstruction. However, correlation for signs and symptoms of urinary tract infection may be warranted. 4.  Findings suggestive of early cirrhosis and mild portal hypertension, as above.   Original Report Authenticated By: Florencia Reasons, M.D.    Dg Chest Port 1 View 12/24/2011  *RADIOLOGY REPORT*  Clinical Data: Well CHF  PORTABLE CHEST - 1 VIEW  Comparison: Prior chest x-ray yesterday, 12/23/2011  Findings: Low lung volumes creates crowding of the pulmonary vasculature.  There may be mild pulmonary vascular congestion bordering on interstitial edema incrementally increased compared to yesterday.  Cardiomegaly is unchanged.  Left subclavian approach biventricular cardiac rhythm maintenance device in unchanged position.  Surgical changes of prior median sternotomy for multivessel CABG.  IMPRESSION:  1.  Mild pulmonary vascular congestion bordering on  interstitial edema, incrementally increased compared to yesterday. 2.  Unchanged cardiomegaly.   Original Report Authenticated By: Alvino Blood Chest Port 1 View 12/23/2011  *RADIOLOGY REPORT*  Clinical Data: Chest pain.  Shortness of breath.  Coronary artery disease.  PORTABLE CHEST - 1 VIEW  Comparison: 08/25/2011  Findings: Low lung volumes again demonstrated, however both lungs are clear.  Mild cardiomegaly stable.  AICD remains in stable position.  Prior CABG again noted.  IMPRESSION: Low lung volumes.  No acute findings.   Original Report Authenticated By: Danae Orleans, M.D.    Dg Shoulder Left 12/23/2011  *RADIOLOGY REPORT*  Clinical Data: Fall.  Shoulder injury and  pain.  LEFT SHOULDER - 2+ VIEW  Comparison: None.  Findings: No evidence of fracture or dislocation.  Mild degenerative spurring is seen involving the distal acromion process and inferior glenoid rim.  Pacemaker is also seen in the overlying chest wall soft tissues.  IMPRESSION:  1.  No acute findings. 2.  Mild degenerative spurring of the inferior glenoid and distal acromion.   Original Report Authenticated By: Danae Orleans, M.D.     ASSESSMENT AND PLAN:   The patient was seen today by Dr Johney Frame, the patient evaluated and the data reviewed.   Ischemic cardiomyopathy - Volume status good right now, continue to track I/O, daily weights, would keep I/O about even. Agree with holding Lasix right now, restart when indicated.   CAD (coronary artery disease) - no ongoing ischemic symptoms, continue ASA/Plavix if OK with surgeons, continue BB - can use IV Rx if he is NPO.   Principal Problem:  *Nausea vomiting and diarrhea Active Problems:  Dizziness   Signed: Theodore Demark 12/24/2011, 2:00 PM Co-Sign MD  I have seen, examined the patient, and reviewed the above assessment and plan.  Changes to above are made where necessary.  Pt well known to me now admitted with acute cholecystitis.  He remains quite stable from a CV standpoint without significant volume overload today. I would recommend that you proceed with surgery if medically indicated.  Continue perioperative beta blockade.  Hold plavix for surgery and restart when able. Place a magnet over his ICD during surgery and remove afterwards. Keep Is and Os even and use caution with perioperative hydration. Dr Mayford Knife to follow with you.  Co Sign: [name], MD 12/24/2011 3:02 PM

## 2011-12-24 NOTE — Progress Notes (Signed)
CRITICAL VALUE ALERT  Critical value received:  Troponin .35  Date of notification: 12/24/11  Time of notification: 1500  Critical value read back:yes  Nurse who received alert:  T.Lockie Bothun RN  MD notified (1st page): Dr.[email or web address]  Time of first page: 1500  MD notified (2nd page):  Time of second page:  Responding MD:    Time MD responded:

## 2011-12-24 NOTE — Consult Note (Signed)
Reason for Consult:Acute cholecystitis Referring Physician: Hongalgi Cards - Allred  KYI [name] is an 71 y.o. male.  HPI: This patient presents with a significant past medical history of ischemic cardiomyopathy with EF 30%, DM, AICD implantation, and a two day history of right-sided abdominal pain, nausea, vomiting, diarrhea.  He was admitted by [facility], and a CT scan showed findings consistent with cholecystitis, but no visible gallstones.  RUQ ultrasound is pending.  He was seen earlier today by Dr. Johney Frame of Cardiology, who felt that he was acceptable risk for surgery and was OK to hold Plavix/ASA, but since that Cardiology consult, a repeat Troponin is now elevated at 0.35.  He continues to have some right-sided pain.  Past Medical History  Diagnosis Date  . Ischemic cardiomyopathy     s/p ICD Implantation by Dr Amil Amen  . Chronic systolic dysfunction of left ventricle     EF 30%  . Hyperlipemia   . DJD (degenerative joint disease)   . Gout   . HTN (hypertension) 02/14/2011  . Depression   . Complication of anesthesia     ONCE WITH BACK SURGERY DIFFICULT TO WAKE UP  . Angina   . Myocardial infarction 1997  . Cardiac defibrillator in situ   . DM (diabetes mellitus) 02/14/2011  . Shortness of breath on exertion   . CAD (coronary artery disease)     s/p CABG 1997, PCI (BMS) of SVG to RCA 3/09, redo CABG 02/2011 with SVG to PDA, SVG to Lcx, s/p cath 2.2013 with ocluded SVT to left circ and patent SVG to RCA  . CHF (congestive heart failure)     EF 30% by cath 05/2011  . ICD (implantable cardiac defibrillator) in place   . GERD (gastroesophageal reflux disease)     Past Surgical History  Procedure Date  . Cardiac defibrillator placement 08/2004    initial placement, upgraded to BIV ICD by Dr Ladona Ridgel 08/24/11 (MDT)  . Knee arthrotomy ~ 1978    RIGHT KNEE CARTILAGE REMOVED  . Coronary angioplasty with stent placement 06/2007    BMS to SVG to RCA  . Coronary angioplasty 02/2011  .  Coronary artery bypass graft 01/1996    CABG x 5 LIMA to LAD SVG to diag1,2,svg to om,svg to RCA  . Coronary artery bypass graft 03/06/2011    CABG X2; Procedure: REDO CORONARY ARTERY BYPASS GRAFTING (CABG);  Surgeon: Delight Ovens, MD;  Location: [facility] OR;  Service: Open Heart Surgery;  Laterality: N/A;  times two grafts using right saphenous vein harvested endoscopically.  . Cataract extraction w/ intraocular lens  implant, bilateral 2012  . Cardiac defibrillator placement 2009  . Lumbar disc surgery 2003  . Back surgery 2003    Family History  Problem Relation Age of Onset  . Coronary artery disease    . Heart failure Mother   . Heart failure Father   . Heart attack Mother   . Heart attack Father     Social History:  reports that he quit smoking about 42 years ago. His smoking use included Cigarettes. He has a 7.5 pack-year smoking history. He has never used smokeless tobacco. He reports that he drinks about .6 ounces of alcohol per week. He reports that he does not use illicit drugs.  Allergies:  Allergies  Allergen Reactions  . Codeine Nausea And Vomiting  . Latex Rash    Specifies adhesive     Medications: I have reviewed the patient's current medications.  Results for orders placed  during the hospital encounter of 12/23/11 (from the past 48 hour(s))  TROPONIN I     Status: Normal   Collection Time   12/23/11  7:24 PM      Component Value Range Comment   Troponin I <0.30  <0.30 ng/mL   CBC WITH DIFFERENTIAL     Status: Abnormal   Collection Time   12/23/11  7:30 PM      Component Value Range Comment   WBC 15.2 (*) 4.0 - 10.5 K/uL    RBC 4.23  4.22 - 5.81 MIL/uL    Hemoglobin 12.6 (*) 13.0 - 17.0 g/dL    HCT 30.8 (*) 65.7 - 52.0 %    MCV 86.1  78.0 - 100.0 fL    MCH 29.8  26.0 - 34.0 pg    MCHC 34.6  30.0 - 36.0 g/dL    RDW 84.6 (*) 96.2 - 15.5 %    Platelets 211  150 - 400 K/uL    Neutrophils Relative 84 (*) 43 - 77 %    Neutro Abs 12.7 (*) 1.7 - 7.7 K/uL     Lymphocytes Relative 3 (*) 12 - 46 %    Lymphs Abs 0.5 (*) 0.7 - 4.0 K/uL    Monocytes Relative 13 (*) 3 - 12 %    Monocytes Absolute 2.0 (*) 0.1 - 1.0 K/uL    Eosinophils Relative 0  0 - 5 %    Eosinophils Absolute 0.0  0.0 - 0.7 K/uL    Basophils Relative 0  0 - 1 %    Basophils Absolute 0.0  0.0 - 0.1 K/uL   COMPREHENSIVE METABOLIC PANEL     Status: Abnormal   Collection Time   12/23/11  7:30 PM      Component Value Range Comment   Sodium 131 (*) 135 - 145 mEq/L    Potassium 3.5  3.5 - 5.1 mEq/L    Chloride 92 (*) 96 - 112 mEq/L    CO2 25  19 - 32 mEq/L    Glucose, Bld 174 (*) 70 - 99 mg/dL    BUN 26 (*) 6 - 23 mg/dL    Creatinine, Ser 9.52  0.50 - 1.35 mg/dL    Calcium 9.0  8.4 - 84.1 mg/dL    Total Protein 6.9  6.0 - 8.3 g/dL    Albumin 2.8 (*) 3.5 - 5.2 g/dL    AST 22  0 - 37 U/L    ALT 15  0 - 53 U/L    Alkaline Phosphatase 97  39 - 117 U/L    Total Bilirubin 1.0  0.3 - 1.2 mg/dL    GFR calc non Af Amer 62 (*) >90 mL/min    GFR calc Af Amer 72 (*) >90 mL/min   LIPASE, BLOOD     Status: Normal   Collection Time   12/23/11  7:30 PM      Component Value Range Comment   Lipase 28  11 - 59 U/L   APTT     Status: Normal   Collection Time   12/23/11  7:30 PM      Component Value Range Comment   aPTT 35  24 - 37 seconds   PROTIME-INR     Status: Abnormal   Collection Time   12/23/11  7:30 PM      Component Value Range Comment   Prothrombin Time 18.7 (*) 11.6 - 15.2 seconds    INR 1.53 (*) 0.00 - 1.49   AMMONIA  Status: Normal   Collection Time   12/23/11  8:46 PM      Component Value Range Comment   Ammonia 31  11 - 60 umol/L   URINALYSIS, ROUTINE W REFLEX MICROSCOPIC     Status: Abnormal   Collection Time   12/23/11  9:45 PM      Component Value Range Comment   Color, Urine AMBER (*) YELLOW BIOCHEMICALS MAY BE AFFECTED BY COLOR   APPearance CLEAR  CLEAR    Specific Gravity, Urine 1.020  1.005 - 1.030    pH 5.5  5.0 - 8.0    Glucose, UA NEGATIVE  NEGATIVE mg/dL    Hgb  urine dipstick NEGATIVE  NEGATIVE    Bilirubin Urine NEGATIVE  NEGATIVE    Ketones, ur 15 (*) NEGATIVE mg/dL    Protein, ur NEGATIVE  NEGATIVE mg/dL    Urobilinogen, UA 2.0 (*) 0.0 - 1.0 mg/dL    Nitrite NEGATIVE  NEGATIVE    Leukocytes, UA NEGATIVE  NEGATIVE MICROSCOPIC NOT DONE ON URINES WITH NEGATIVE PROTEIN, BLOOD, LEUKOCYTES, NITRITE, OR GLUCOSE <1000 mg/dL.  TROPONIN I     Status: Normal   Collection Time   12/24/11  1:53 AM      Component Value Range Comment   Troponin I <0.30  <0.30 ng/mL   GLUCOSE, CAPILLARY     Status: Abnormal   Collection Time   12/24/11  1:58 AM      Component Value Range Comment   Glucose-Capillary 201 (*) 70 - 99 mg/dL    Comment 1 Notify RN     COMPREHENSIVE METABOLIC PANEL     Status: Abnormal   Collection Time   12/24/11  5:00 AM      Component Value Range Comment   Sodium 131 (*) 135 - 145 mEq/L    Potassium 3.3 (*) 3.5 - 5.1 mEq/L    Chloride 93 (*) 96 - 112 mEq/L    CO2 27  19 - 32 mEq/L    Glucose, Bld 192 (*) 70 - 99 mg/dL    BUN 26 (*) 6 - 23 mg/dL    Creatinine, Ser 9.56  0.50 - 1.35 mg/dL    Calcium 8.9  8.4 - 21.3 mg/dL    Total Protein 6.7  6.0 - 8.3 g/dL    Albumin 2.5 (*) 3.5 - 5.2 g/dL    AST 25  0 - 37 U/L    ALT 16  0 - 53 U/L    Alkaline Phosphatase 101  39 - 117 U/L    Total Bilirubin 0.8  0.3 - 1.2 mg/dL    GFR calc non Af Amer 64 (*) >90 mL/min    GFR calc Af Amer 75 (*) >90 mL/min   CBC WITH DIFFERENTIAL     Status: Abnormal   Collection Time   12/24/11  5:00 AM      Component Value Range Comment   WBC 13.0 (*) 4.0 - 10.5 K/uL    RBC 4.27  4.22 - 5.81 MIL/uL    Hemoglobin 12.6 (*) 13.0 - 17.0 g/dL    HCT 08.6 (*) 57.8 - 52.0 %    MCV 86.2  78.0 - 100.0 fL    MCH 29.5  26.0 - 34.0 pg    MCHC 34.2  30.0 - 36.0 g/dL    RDW 46.9 (*) 62.9 - 15.5 %    Platelets 217  150 - 400 K/uL    Neutrophils Relative 83 (*) 43 - 77 %  Neutro Abs 10.7 (*) 1.7 - 7.7 K/uL    Lymphocytes Relative 6 (*) 12 - 46 %    Lymphs Abs 0.7  0.7 -  4.0 K/uL    Monocytes Relative 11  3 - 12 %    Monocytes Absolute 1.4 (*) 0.1 - 1.0 K/uL    Eosinophils Relative 1  0 - 5 %    Eosinophils Absolute 0.1  0.0 - 0.7 K/uL    Basophils Relative 0  0 - 1 %    Basophils Absolute 0.0  0.0 - 0.1 K/uL   GLUCOSE, CAPILLARY     Status: Abnormal   Collection Time   12/24/11  6:13 AM      Component Value Range Comment   Glucose-Capillary 175 (*) 70 - 99 mg/dL   CLOSTRIDIUM DIFFICILE BY PCR     Status: Normal   Collection Time   12/24/11  8:15 AM      Component Value Range Comment   C difficile by pcr NEGATIVE  NEGATIVE   MAGNESIUM     Status: Normal   Collection Time   12/24/11 10:35 AM      Component Value Range Comment   Magnesium 1.8  1.5 - 2.5 mg/dL   GLUCOSE, CAPILLARY     Status: Abnormal   Collection Time   12/24/11 10:45 AM      Component Value Range Comment   Glucose-Capillary 215 (*) 70 - 99 mg/dL   TROPONIN I     Status: Normal   Collection Time   12/24/11 11:00 AM      Component Value Range Comment   Troponin I <0.30  <0.30 ng/mL   PRO B NATRIURETIC PEPTIDE     Status: Abnormal   Collection Time   12/24/11 11:00 AM      Component Value Range Comment   Pro B Natriuretic peptide (BNP) 7842.0 (*) 0 - 125 pg/mL   TROPONIN I     Status: Abnormal   Collection Time   12/24/11 12:50 PM      Component Value Range Comment   Troponin I 0.35 (*) <0.30 ng/mL     Ct Head Wo Contrast  12/23/2011  *RADIOLOGY REPORT*  Clinical Data: Dizziness.  Recurrent falls.  CT HEAD WITHOUT CONTRAST  Technique:  Contiguous axial images were obtained from the base of the skull through the vertex without contrast.  Comparison: None.  Findings: There is no evidence of intracranial hemorrhage, brain edema or other signs of acute infarction.  There is no evidence of intracranial mass lesion or mass effect.  No abnormal extra-axial fluid collections are identified.  Mild diffuse cerebral atrophy noted.  No evidence of hydrocephalus. No evidence of skull fracture or other  bone abnormality.  IMPRESSION:  1.  No acute intracranial abnormality. 2.  Mild diffuse cerebral atrophy.   Original Report Authenticated By: Danae Orleans, M.D.    Ct Abdomen Pelvis W Contrast  12/24/2011  *RADIOLOGY REPORT*  Clinical Data: Nausea, vomiting, diarrhea and abdominal pain.  CT ABDOMEN AND PELVIS WITH CONTRAST  Technique:  Multidetector CT imaging of the abdomen and pelvis was performed following the standard protocol during bolus administration of intravenous contrast.  Contrast: OMNIPAQUE IOHEXOL 300 MG/ML  SOLN  Comparison: CT of the abdomen and pelvis 05/16/2009.  Findings:  Lung Bases: Cardiomegaly with left ventricular dilatation. Atherosclerotic calcifications in the left anterior descending, left circumflex and right coronary arteries.  Probable coronary artery stent to the right coronary artery.  Status post median  sternotomy.  Pacemaker wires are seen extending into the right atrium, right ventricular apex, and into the coronary veins overlying the left lateral ventricle via the coronary sinus. There is no significant pericardial fluid, thickening or pericardial calcification.  Abdomen/Pelvis:  The gallbladder is distended, demonstrates some diffuse wall thickening and has some pericholecystic inflammatory changes, concerning for acute cholecystitis.  No radiopaque gallstones are confidently identified.  The common bile duct does not appear dilated.  The intrahepatic biliary tree also does not appear to be dilated.  There is a slightly shrunken and nodular contour to the liver, which suggests early morphologic changes of cirrhosis.  Portal vein does appear dilated (19 mm in diameter), suggesting some degree of portal hypertension.  The enhanced appearance of the pancreas, spleen and bilateral adrenal glands is unremarkable.  There are multifocal areas of parenchymal thinning in the kidneys bilaterally (left greater than right), likely represent areas of post infectious/inflammatory  scarring.  There is extensive atherosclerosis of the abdominal and pelvic vasculature, without evidence of aneurysm or dissection. There is a small amount of soft tissue stranding which extends down the right side of the retroperitoneum, and a trace amount of reactive fluid in the right pericolic gutter, all of which appear to be related to the inflammatory changes around the gallbladder. The no larger volume of ascites.  No pneumoperitoneum.  No pathologic distension of bowel.  Normal appendix.  Numerous colonic diverticula, including diverticulae in the region of the hepatic flexure of the colon.  The inflammatory changes associated with the gallbladder are intimately associated with the hepatic flexure of the colon, but the inflammatory changes do not appear to have their epicenter in the region of the hepatic flexure of the colon.  The remainder of the colonic diverticula show no evidence of inflammatory changes to suggest acute diverticulitis at this time. Mild thickening of the urinary bladder wall. The prostate gland appears mildly enlarged.  Musculoskeletal: Clips in the right inguinal region, likely from prior vascular access. There are no aggressive appearing lytic or blastic lesions noted in the visualized portions of the skeleton.  IMPRESSION: 1.  Moderate distension of the gallbladder with gallbladder wall thickening and pericholecystic inflammatory changes highly suspicious for acute cholecystitis.  No radiopaque gallstones are identified, however, and there are numerous colonic diverticula in the adjacent hepatic flexure of the colon.  Overall, the findings are strongly favored to represent acute cholecystitis, but further evaluation with right upper quadrant ultrasound may be warranted to confirm this diagnosis, as an alternative diagnosis would be an acute hepatic flexure diverticulitis (not strongly favored). 2.  Extensive atherosclerosis, including at least three vessel coronary artery disease. 3.   Thickening of the urinary bladder wall.  Given mild prostate enlargement, this may be related to bladder outlet obstruction. However, correlation for signs and symptoms of urinary tract infection may be warranted. 4.  Findings suggestive of early cirrhosis and mild portal hypertension, as above.   Original Report Authenticated By: Florencia Reasons, M.D.    Dg Chest Port 1 View  12/24/2011  *RADIOLOGY REPORT*  Clinical Data: Well CHF  PORTABLE CHEST - 1 VIEW  Comparison: Prior chest x-ray yesterday, 12/23/2011  Findings: Low lung volumes creates crowding of the pulmonary vasculature.  There may be mild pulmonary vascular congestion bordering on interstitial edema incrementally increased compared to yesterday.  Cardiomegaly is unchanged.  Left subclavian approach biventricular cardiac rhythm maintenance device in unchanged position.  Surgical changes of prior median sternotomy for multivessel CABG.  IMPRESSION:  1.  Mild pulmonary vascular congestion bordering on interstitial edema, incrementally increased compared to yesterday. 2.  Unchanged cardiomegaly.   Original Report Authenticated By: Alvino Blood Chest Port 1 View  12/23/2011  *RADIOLOGY REPORT*  Clinical Data: Chest pain.  Shortness of breath.  Coronary artery disease.  PORTABLE CHEST - 1 VIEW  Comparison: 08/25/2011  Findings: Low lung volumes again demonstrated, however both lungs are clear.  Mild cardiomegaly stable.  AICD remains in stable position.  Prior CABG again noted.  IMPRESSION: Low lung volumes.  No acute findings.   Original Report Authenticated By: Danae Orleans, M.D.    Dg Shoulder Left  12/23/2011  *RADIOLOGY REPORT*  Clinical Data: Fall.  Shoulder injury and pain.  LEFT SHOULDER - 2+ VIEW  Comparison: None.  Findings: No evidence of fracture or dislocation.  Mild degenerative spurring is seen involving the distal acromion process and inferior glenoid rim.  Pacemaker is also seen in the overlying chest wall soft tissues.  IMPRESSION:   1.  No acute findings. 2.  Mild degenerative spurring of the inferior glenoid and distal acromion.   Original Report Authenticated By: Danae Orleans, M.D.     Review of Systems  Eyes: Negative.   Respiratory: Positive for shortness of breath.   Cardiovascular: Positive for chest pain.  Gastrointestinal: Positive for nausea, vomiting, abdominal pain and diarrhea.  Neurological: Negative.   Endo/Heme/Allergies: Negative.    Blood pressure 111/69, pulse 66, temperature 97.1 F (36.2 C), temperature source Oral, resp. rate 20, height 5\' 9"  (1.753 m), weight 222 lb 6.4 oz (100.88 kg), SpO2 94.00%. Physical Exam Obese WM resting comfortably HEENT:  EOMI, sclera anicteric Neck:  No masses, no thyromegaly Lungs:  CTA bilaterally; normal respiratory effort CV:  Regular rate and rhythm; no murmurs Abd:  +bowel sounds, soft, mild tenderness in RUQ and RLQ. No palpable masses. Ext:  Well-perfused; no edema Skin:  Warm, dry; no sign of jaundice  Assessment/Plan: Probable acute cholecystitis - to be confirmed on ultrasound; clinical examination consistent with acute cholecystitis  Cardiac comorbidities - elevated troponin - being evaluated by Cardiology  If cardiac work-up is negative and he is felt to be acceptable risk, recommend laparoscopic cholecystectomy with intraoperative cholangiogram alter this week.  For now, keep him NPO and hold Aspirin and Plavix.  Recheck coags in AM.   Myalee Stengel K. 12/24/2011, 4:28 PM

## 2011-12-24 NOTE — H&P (Signed)
[name] is an 71 y.o. male.   Patient was seen and examined on December 24, 2011. PCP - Dr. Cam Hai. Cardiologist - Dr. Eliott Nine. Chief Complaint: Nausea vomiting and dizziness. HPI: 71 year old male with history of ischemic cardiomyopathy last EF measured was 30%, diabetes mellitus type 2, hypertension, AICD placement presented the ER because of nausea vomiting and dizziness. Patient has been having nausea and vomiting with diarrhea last 2-3 days. Denies any use of antibiotics or recent hospitalization. In addition patient also complains of right lower quadrant pain. Yesterday patient felt weak and dizzy but it was standing. In the ER patient was found to be orthostatic. Was given IV fluid bolus and has been admitted for further management. At this time patient still complains of mild right lower quadrant discomfort. Denies chest pain or shortness of breath. Denies any focal deficit visual symptoms or any slurred speech or difficulty swallowing.  Past Medical History  Diagnosis Date  . Ischemic cardiomyopathy     s/p ICD Implantation by Dr Amil Amen  . Chronic systolic dysfunction of left ventricle     EF 30%  . Hyperlipemia   . DJD (degenerative joint disease)   . Gout   . HTN (hypertension) 02/14/2011  . Depression   . Complication of anesthesia     ONCE WITH BACK SURGERY DIFFICULT TO WAKE UP  . Angina   . Myocardial infarction 1997  . Cardiac defibrillator in situ   . DM (diabetes mellitus) 02/14/2011  . Shortness of breath on exertion   . CAD (coronary artery disease)     s/p CABG 1997, PCI (BMS) of SVG to RCA 3/09, redo CABG 02/2011 with SVG to PDA, SVG to Lcx, s/p cath 2.2013 with ocluded SVT to left circ and patent SVG to RCA  . CHF (congestive heart failure)     EF 30% by cath 05/2011  . ICD (implantable cardiac defibrillator) in place   . GERD (gastroesophageal reflux disease)     Past Surgical History  Procedure Date  . Cardiac defibrillator placement  08/2004    initial placement, upgraded to BIV ICD by Dr Ladona Ridgel 08/24/11 (MDT)  . Knee arthrotomy ~ 1978    RIGHT KNEE CARTILAGE REMOVED  . Coronary angioplasty with stent placement 06/2007    BMS to SVG to RCA  . Coronary angioplasty 02/2011  . Coronary artery bypass graft 01/1996    CABG x 5 LIMA to LAD SVG to diag1,2,svg to om,svg to RCA  . Coronary artery bypass graft 03/06/2011    CABG X2; Procedure: REDO CORONARY ARTERY BYPASS GRAFTING (CABG);  Surgeon: Delight Ovens, MD;  Location: [facility] OR;  Service: Open Heart Surgery;  Laterality: N/A;  times two grafts using right saphenous vein harvested endoscopically.  . Cataract extraction w/ intraocular lens  implant, bilateral 2012  . Cardiac defibrillator placement 2009  . Lumbar disc surgery 2003  . Back surgery 2003    Family History  Problem Relation Age of Onset  . Coronary artery disease    . Heart failure Mother   . Heart failure Father    Social History:  reports that he quit smoking about 42 years ago. His smoking use included Cigarettes. He has a 7.5 pack-year smoking history. He has never used smokeless tobacco. He reports that he drinks about .6 ounces of alcohol per week. He reports that he does not use illicit drugs.  Allergies:  Allergies  Allergen Reactions  . Codeine Nausea And Vomiting  . Latex Rash  Specifies adhesive     Medications Prior to Admission  Medication Sig Dispense Refill  . allopurinol (ZYLOPRIM) 100 MG tablet Take 200 mg by mouth daily.       Marland Kitchen aspirin EC 81 MG tablet Take 81 mg by mouth daily.      Marland Kitchen atorvastatin (LIPITOR) 40 MG tablet Take 40 mg by mouth daily.       . carvedilol (COREG) 25 MG tablet Take 25 mg by mouth 2 (two) times daily.       . clopidogrel (PLAVIX) 75 MG tablet Take 75 mg by mouth every morning.      Marland Kitchen eplerenone (INSPRA) 25 MG tablet Take 25 mg by mouth daily.       . furosemide (LASIX) 40 MG tablet Take 40 mg by mouth 2 (two) times daily.      . insulin glargine  (LANTUS) 100 UNIT/ML injection Inject 35 Units into the skin 2 (two) times daily.       . insulin glulisine (APIDRA) 100 UNIT/ML injection Inject 40 Units into the skin 2 (two) times daily.       . niacin (NIASPAN) 500 MG CR tablet Take 1,000 mg by mouth 2 (two) times daily.       . nitroGLYCERIN (NITROSTAT) 0.4 MG SL tablet Place 0.4 mg under the tongue every 5 (five) minutes x 3 doses as needed. For chest pain.      . ranolazine (RANEXA) 500 MG 12 hr tablet Take 1,000 mg by mouth 2 (two) times daily.      . traMADol (ULTRAM) 50 MG tablet Take 50 mg by mouth every 6 (six) hours as needed. For pain.      Marland Kitchen triamcinolone (KENALOG) 0.1 % cream Apply 1 application topically daily as needed. Apply sparingly to affected area externally two times a day as needed for itching and rash      . venlafaxine (EFFEXOR-XR) 150 MG 24 hr capsule Take 150 mg by mouth daily.        Marland Kitchen zolpidem (AMBIEN CR) 12.5 MG CR tablet Take 12.5 mg by mouth at bedtime as needed. For sleep        Results for orders placed during the hospital encounter of 12/23/11 (from the past 48 hour(s))  TROPONIN I     Status: Normal   Collection Time   12/23/11  7:24 PM      Component Value Range Comment   Troponin I <0.30  <0.30 ng/mL   CBC WITH DIFFERENTIAL     Status: Abnormal   Collection Time   12/23/11  7:30 PM      Component Value Range Comment   WBC 15.2 (*) 4.0 - 10.5 K/uL    RBC 4.23  4.22 - 5.81 MIL/uL    Hemoglobin 12.6 (*) 13.0 - 17.0 g/dL    HCT 16.1 (*) 09.6 - 52.0 %    MCV 86.1  78.0 - 100.0 fL    MCH 29.8  26.0 - 34.0 pg    MCHC 34.6  30.0 - 36.0 g/dL    RDW 04.5 (*) 40.9 - 15.5 %    Platelets 211  150 - 400 K/uL    Neutrophils Relative 84 (*) 43 - 77 %    Neutro Abs 12.7 (*) 1.7 - 7.7 K/uL    Lymphocytes Relative 3 (*) 12 - 46 %    Lymphs Abs 0.5 (*) 0.7 - 4.0 K/uL    Monocytes Relative 13 (*) 3 - 12 %  Monocytes Absolute 2.0 (*) 0.1 - 1.0 K/uL    Eosinophils Relative 0  0 - 5 %    Eosinophils Absolute 0.0   0.0 - 0.7 K/uL    Basophils Relative 0  0 - 1 %    Basophils Absolute 0.0  0.0 - 0.1 K/uL   COMPREHENSIVE METABOLIC PANEL     Status: Abnormal   Collection Time   12/23/11  7:30 PM      Component Value Range Comment   Sodium 131 (*) 135 - 145 mEq/L    Potassium 3.5  3.5 - 5.1 mEq/L    Chloride 92 (*) 96 - 112 mEq/L    CO2 25  19 - 32 mEq/L    Glucose, Bld 174 (*) 70 - 99 mg/dL    BUN 26 (*) 6 - 23 mg/dL    Creatinine, Ser 4.09  0.50 - 1.35 mg/dL    Calcium 9.0  8.4 - 81.1 mg/dL    Total Protein 6.9  6.0 - 8.3 g/dL    Albumin 2.8 (*) 3.5 - 5.2 g/dL    AST 22  0 - 37 U/L    ALT 15  0 - 53 U/L    Alkaline Phosphatase 97  39 - 117 U/L    Total Bilirubin 1.0  0.3 - 1.2 mg/dL    GFR calc non Af Amer 62 (*) >90 mL/min    GFR calc Af Amer 72 (*) >90 mL/min   LIPASE, BLOOD     Status: Normal   Collection Time   12/23/11  7:30 PM      Component Value Range Comment   Lipase 28  11 - 59 U/L   APTT     Status: Normal   Collection Time   12/23/11  7:30 PM      Component Value Range Comment   aPTT 35  24 - 37 seconds   PROTIME-INR     Status: Abnormal   Collection Time   12/23/11  7:30 PM      Component Value Range Comment   Prothrombin Time 18.7 (*) 11.6 - 15.2 seconds    INR 1.53 (*) 0.00 - 1.49   AMMONIA     Status: Normal   Collection Time   12/23/11  8:46 PM      Component Value Range Comment   Ammonia 31  11 - 60 umol/L   URINALYSIS, ROUTINE W REFLEX MICROSCOPIC     Status: Abnormal   Collection Time   12/23/11  9:45 PM      Component Value Range Comment   Color, Urine AMBER (*) YELLOW BIOCHEMICALS MAY BE AFFECTED BY COLOR   APPearance CLEAR  CLEAR    Specific Gravity, Urine 1.020  1.005 - 1.030    pH 5.5  5.0 - 8.0    Glucose, UA NEGATIVE  NEGATIVE mg/dL    Hgb urine dipstick NEGATIVE  NEGATIVE    Bilirubin Urine NEGATIVE  NEGATIVE    Ketones, ur 15 (*) NEGATIVE mg/dL    Protein, ur NEGATIVE  NEGATIVE mg/dL    Urobilinogen, UA 2.0 (*) 0.0 - 1.0 mg/dL    Nitrite NEGATIVE   NEGATIVE    Leukocytes, UA NEGATIVE  NEGATIVE MICROSCOPIC NOT DONE ON URINES WITH NEGATIVE PROTEIN, BLOOD, LEUKOCYTES, NITRITE, OR GLUCOSE <1000 mg/dL.   Ct Head Wo Contrast  12/23/2011  *RADIOLOGY REPORT*  Clinical Data: Dizziness.  Recurrent falls.  CT HEAD WITHOUT CONTRAST  Technique:  Contiguous axial images were obtained from the base  of the skull through the vertex without contrast.  Comparison: None.  Findings: There is no evidence of intracranial hemorrhage, brain edema or other signs of acute infarction.  There is no evidence of intracranial mass lesion or mass effect.  No abnormal extra-axial fluid collections are identified.  Mild diffuse cerebral atrophy noted.  No evidence of hydrocephalus. No evidence of skull fracture or other bone abnormality.  IMPRESSION:  1.  No acute intracranial abnormality. 2.  Mild diffuse cerebral atrophy.   Original Report Authenticated By: Danae Orleans, M.D.    Dg Chest Port 1 View  12/23/2011  *RADIOLOGY REPORT*  Clinical Data: Chest pain.  Shortness of breath.  Coronary artery disease.  PORTABLE CHEST - 1 VIEW  Comparison: 08/25/2011  Findings: Low lung volumes again demonstrated, however both lungs are clear.  Mild cardiomegaly stable.  AICD remains in stable position.  Prior CABG again noted.  IMPRESSION: Low lung volumes.  No acute findings.   Original Report Authenticated By: Danae Orleans, M.D.    Dg Shoulder Left  12/23/2011  *RADIOLOGY REPORT*  Clinical Data: Fall.  Shoulder injury and pain.  LEFT SHOULDER - 2+ VIEW  Comparison: None.  Findings: No evidence of fracture or dislocation.  Mild degenerative spurring is seen involving the distal acromion process and inferior glenoid rim.  Pacemaker is also seen in the overlying chest wall soft tissues.  IMPRESSION:  1.  No acute findings. 2.  Mild degenerative spurring of the inferior glenoid and distal acromion.   Original Report Authenticated By: Danae Orleans, M.D.     Review of Systems  Constitutional:  Negative.   HENT: Negative.   Eyes: Negative.   Respiratory: Negative.   Cardiovascular: Negative.   Gastrointestinal: Positive for nausea, vomiting and abdominal pain.  Genitourinary: Negative.   Musculoskeletal: Negative.   Skin: Negative.   Neurological: Positive for dizziness.  Endo/Heme/Allergies: Negative.   Psychiatric/Behavioral: Negative.     Blood pressure 121/68, pulse 83, temperature 98.6 F (37 C), temperature source Oral, resp. rate 21, height 5\' 9"  (1.753 m), weight 100.88 kg (222 lb 6.4 oz), SpO2 96.00%. Physical Exam  Constitutional: He is oriented to person, place, and time. He appears well-developed and well-nourished. No distress.  HENT:  Head: Normocephalic and atraumatic.  Right Ear: External ear normal.  Left Ear: External ear normal.  Nose: Nose normal.  Mouth/Throat: Oropharynx is clear and moist. No oropharyngeal exudate.  Eyes: Conjunctivae are normal. Pupils are equal, round, and reactive to light. Right eye exhibits no discharge. Left eye exhibits no discharge. No scleral icterus.  Neck: Normal range of motion. Neck supple.  Cardiovascular: Normal rate and regular rhythm.   Respiratory: Effort normal and breath sounds normal. No respiratory distress. He has no wheezes. He has no rales.  GI: Soft. Bowel sounds are normal.  Musculoskeletal: Normal range of motion. He exhibits no edema and no tenderness.  Neurological: He is alert and oriented to person, place, and time.       Moves all extremities.  Skin: Skin is warm and dry. He is not diaphoretic.  Psychiatric: His behavior is normal.     Assessment/Plan #1. Dizziness most likely from dehydration - patient is orthostatic on admission. At this time we will hold off patient's diuretics and is gently hydrate. Patient has ischemic cardiomyopathy and we'll need to closely follow fluid status. #2. Nausea vomiting and diarrhea with abdominal pain - since patient is complaining of right lower quadrant pain  and patient has leukocytosis I  have ordered CT abdomen and pelvis. Gently hydrate. Check stool studies and C. Difficile. #3. History of ischemic cardiomyopathy status post CABG and AICD placement - denies any chest pain. #4. Diabetes mellitus 2 -  since patient's intake is not reliable I have decreased patient's insulin dose at this time. Closely follow CBG.   CODE STATUS  - full code.  KAKRAKANDY,ARSHAD N. 12/24/2011, 2:07 AM

## 2011-12-25 DIAGNOSIS — R112 Nausea with vomiting, unspecified: Secondary | ICD-10-CM | POA: Diagnosis not present

## 2011-12-25 DIAGNOSIS — K810 Acute cholecystitis: Secondary | ICD-10-CM | POA: Diagnosis not present

## 2011-12-25 DIAGNOSIS — I2510 Atherosclerotic heart disease of native coronary artery without angina pectoris: Secondary | ICD-10-CM | POA: Diagnosis not present

## 2011-12-25 DIAGNOSIS — E119 Type 2 diabetes mellitus without complications: Secondary | ICD-10-CM | POA: Diagnosis not present

## 2011-12-25 LAB — CBC
HCT: 35.1 % — ABNORMAL LOW (ref 39.0–52.0)
MCHC: 33.3 g/dL (ref 30.0–36.0)
MCV: 87.1 fL (ref 78.0–100.0)
Platelets: 225 10*3/uL (ref 150–400)
RDW: 16.7 % — ABNORMAL HIGH (ref 11.5–15.5)
WBC: 10.1 10*3/uL (ref 4.0–10.5)

## 2011-12-25 LAB — BASIC METABOLIC PANEL
BUN: 24 mg/dL — ABNORMAL HIGH (ref 6–23)
Calcium: 9.1 mg/dL (ref 8.4–10.5)
Creatinine, Ser: 1.05 mg/dL (ref 0.50–1.35)
GFR calc Af Amer: 82 mL/min — ABNORMAL LOW (ref >90)
GFR calc non Af Amer: 70 mL/min — ABNORMAL LOW (ref >90)

## 2011-12-25 LAB — PROTIME-INR
INR: 1.39 (ref 0.00–1.49)
Prothrombin Time: 17.3 seconds — ABNORMAL HIGH (ref 11.6–15.2)

## 2011-12-25 LAB — GLUCOSE, CAPILLARY
Glucose-Capillary: 227 mg/dL — ABNORMAL HIGH (ref 70–99)
Glucose-Capillary: 239 mg/dL — ABNORMAL HIGH (ref 70–99)
Glucose-Capillary: 137 mg/dL — ABNORMAL HIGH (ref 70–99)

## 2011-12-25 MED ORDER — PANTOPRAZOLE SODIUM 40 MG IV SOLR
40.0000 mg | Freq: Every day | INTRAVENOUS | Status: DC
  Administered 2011-12-25: 40 mg via INTRAVENOUS
  Filled 2011-12-25 (×2): qty 40

## 2011-12-25 NOTE — Progress Notes (Addendum)
[facility] PROGRESS NOTE  [name] ZOX:[id number] DOB: 02/04/1941 DOA: 12/23/2011 PCP: Lupita Raider, MD  Assessment/Plan: Principal Problem:  *Acute cholecystitis Active Problems:  Ischemic cardiomyopathy  CAD (coronary artery disease)  Nausea vomiting and diarrhea  Dizziness  Preop cardiovascular exam  1. Acute cholecystitis: Surgical consultation appreciated. Cardiology preop clearance consultation appreciated-deemed stable from a CV standpoint. Aspirin and Plavix held (last dose 9/8). Patient n.p.o. except sips and meds. Continue perioperative beta-blockade and keep I/O's even. Patient at moderate risk for perioperative CV events. 2. Nausea, vomiting, diarrhea and abdominal pain: Possibly secondary to problem #1. N.p.o. improved. C. difficile PCR negative 3. CAD status post CABG/ischemic cardiomyopathy/pacemaker/AICD: Patient denies chest pain. Dyspnea at baseline. Cardiac enzymes showed solitary elevation of troponin and 0.35 yesterday afternoon but 2 troponin since have been negative-unclear significance. Cardiology consultation appreciated. Continue carvedilol and Ranexa. 4. Diabetes mellitus type 2: Reduced bedtime Lantus and change sliding scale to every 4 hours due to n.p.o.. Monitor. Mildly uncontrolled. 5. Hypokalemia: Replete and aim to keep greater than 4. Repleted. 6. NSVT: Patient has AICD. Keep mag greater than 2 and potassium greater than 4. Continue carvedilol. Status post magnesium and potassium supplements on 9/8. 7. Hyponatremia: Possibly multifactorial secondary to congestive heart failure and dehydration. Stable. 8. Orthostatic hypotension:? Secondary to dehydration. Brief gentle IV fluids. Lasix temporarily held. No further orthostatic BPs done. 9. Chronic systolic congestive heart failure: Clinically compensated. Lasix temporarily on hold. Monitor volume status closely. 10. Anemia:? Dilutional. Follow CBC in a.m. 11. Leukocytosis: Secondary to problem  #1. Follow CBC in a.m.  Code Status: Full Family Communication: Discussed with spouse at the bedside in detail, updated care and answered questions (9/9). Disposition Plan: Home when stable.   Brief narrative: 71 year old male with history of ischemic cardiomyopathy last EF measured was 30%, diabetes mellitus type 2, hypertension, AICD placement presented the ER because of nausea vomiting and dizziness. Patient has been having nausea and vomiting with diarrhea last 2-3 days. Denies any use of antibiotics or recent hospitalization. In addition patient also complains of right lower quadrant pain. Yesterday patient felt weak and dizzy but it was standing. In the ER patient was found to be orthostatic. Was given IV fluid bolus and has been admitted for further management   Consultants:  General surgery.  [place] [facility].  Procedures:  None  Antibiotics:  IV Cipro and Flagyl 9/8  HPI/Subjective: Mild nausea and right upper quadrant abdominal pain overnight. No vomiting or diarrhea since yesterday. Denies dyspnea or chest pain.  Objective: Filed Vitals:   12/24/11 1509 12/24/11 2046 12/25/11 0100 12/25/11 0431  BP: 111/69 124/70 102/69 90/43  Pulse: 66 83 78 80  Temp: 97.1 F (36.2 C) 97.3 F (36.3 C) 97.6 F (36.4 C) 97.2 F (36.2 C)  TempSrc: Oral Oral Oral Oral  Resp: 20 19 18 18   Height:      Weight:    102.694 kg (226 lb 6.4 oz)  SpO2: 94% 100% 98% 96%    Intake/Output Summary (Last 24 hours) at 12/25/11 0751 Last data filed at 12/24/11 1901  Gross per 24 hour  Intake    360 ml  Output      4 ml  Net    356 ml   Filed Weights   12/24/11 0126 12/25/11 0431  Weight: 100.88 kg (222 lb 6.4 oz) 102.694 kg (226 lb 6.4 oz)    Exam:  General exam: Moderately built and nourished male, in no obvious distress. Lying comfortably supine in bed Respiratory system:  Clear to auscultation. No increased work of breathing. Cardiovascular system: S1 and S2 heard, regular  rate and rhythm. No JVD, murmurs or gallops. Trace bilateral leg edema. Telemetry shows V paced rhythm and occasional NSVT. Gastrointestinal system: Abdomen is obese, soft and mild right upper quadrant tenderness at this time without any rigidity guarding or rebound. No organomegaly or masses appreciated. Bowel sounds are normally heard. Central nervous system: Alert and oriented. No focal deficits. Extremities: Symmetric 5 x 5 power.   Data Reviewed: Basic Metabolic Panel:  Lab 12/25/11 1610 12/24/11 1035 12/24/11 0500 12/23/11 1930  NA 133* -- 131* 131*  K 4.2 -- 3.3* 3.5  CL 97 -- 93* 92*  CO2 28 -- 27 25  GLUCOSE 215* -- 192* 174*  BUN 24* -- 26* 26*  CREATININE 1.05 -- 1.13 1.17  CALCIUM 9.1 -- 8.9 9.0  MG -- 1.8 -- --  PHOS -- -- -- --   Liver Function Tests:  Lab 12/24/11 0500 12/23/11 1930  AST 25 22  ALT 16 15  ALKPHOS 101 97  BILITOT 0.8 1.0  PROT 6.7 6.9  ALBUMIN 2.5* 2.8*    Lab 12/23/11 1930  LIPASE 28  AMYLASE --    Lab 12/23/11 2046  AMMONIA 31   CBC:  Lab 12/25/11 0607 12/24/11 0500 12/23/11 1930  WBC 10.1 13.0* 15.2*  NEUTROABS -- 10.7* 12.7*  HGB 11.7* 12.6* 12.6*  HCT 35.1* 36.8* 36.4*  MCV 87.1 86.2 86.1  PLT 225 217 211   Cardiac Enzymes:  Lab 12/25/11 0607 12/24/11 2158 12/24/11 1656 12/24/11 1250 12/24/11 1100 12/24/11 0153  CKTOTAL 95 128 152 -- -- --  CKMB 2.3 2.5 2.5 -- -- --  CKMBINDEX -- -- -- -- -- --  TROPONINI <0.30 <0.30 -- 0.35* <0.30 <0.30   BNP (last 3 results)  Basename 12/24/11 1100 08/21/11 2118 06/05/11 2021  PROBNP 7842.0* 3265.0* 2501.0*   CBG:  Lab 12/25/11 0111 12/24/11 2021 12/24/11 1625 12/24/11 1045 12/24/11 0613  GLUCAP 227* 191* 176* 215* 175*    Recent Results (from the past 240 hour(s))  CLOSTRIDIUM DIFFICILE BY PCR     Status: Normal   Collection Time   12/24/11  8:15 AM      Component Value Range Status Comment   C difficile by pcr NEGATIVE  NEGATIVE Final      Studies: Ct Head Wo  Contrast  12/23/2011  *RADIOLOGY REPORT*  Clinical Data: Dizziness.  Recurrent falls.  CT HEAD WITHOUT CONTRAST  Technique:  Contiguous axial images were obtained from the base of the skull through the vertex without contrast.  Comparison: None.  Findings: There is no evidence of intracranial hemorrhage, brain edema or other signs of acute infarction.  There is no evidence of intracranial mass lesion or mass effect.  No abnormal extra-axial fluid collections are identified.  Mild diffuse cerebral atrophy noted.  No evidence of hydrocephalus. No evidence of skull fracture or other bone abnormality.  IMPRESSION:  1.  No acute intracranial abnormality. 2.  Mild diffuse cerebral atrophy.   Original Report Authenticated By: Danae Orleans, M.D.    US Abdomen Complete  12/24/2011  *RADIOLOGY REPORT*  Clinical Data:  Abdominal pain, nausea, vomiting and diarrhea. Findings on an abdomen CT earlier today highly suspicious for acute cholecystitis without visible gallstones.  COMPLETE ABDOMINAL ULTRASOUND  Comparison:  Abdomen CT obtained earlier today.  Renal ultrasound dated 07/28/2011.  Findings:  Gallbladder:  Gallstones and sludge in the gallbladder.  The largest individual stone measures  9 mm in maximum diameter. Diffuse gallbladder wall thickening with a maximum thickness of 13.1 mm.  Small amount of pericholecystic fluid.  The patient was focally tender over the gallbladder.  Common bile duct:  Limited visualization.  The visualized portion measures 5.4 mm in diameter proximally.  Liver:  Mildly heterogeneous, with corresponding low density on the recent CT.  IVC:  Appears normal.  Pancreas:  Limited visualization.  The visualized portions appear normal and the remainder of the pancreas appear normal on the CT earlier today.  Spleen:  Normal, measuring 7.7 cm in length.  Right Kidney:  Mild fetal lobulations.  Otherwise, normal, measuring 12.6 cm in length.  Left Kidney:  Mild fetal lobulations.  Otherwise, normal,  measuring 13.4 cm in length.  Abdominal aorta:  Poorly visualized.  The visualized portions are normal in caliber.  The remainder was visualized and normal in caliber on the CT.  IMPRESSION:  1.  Acute cholecystitis with cholelithiasis, sludge in the gallbladder, diffuse gallbladder wall thickening, mild pericholecystic fluid and focal tenderness over the gallbladder. 2.  Diffuse hepatic steatosis.   Original Report Authenticated By: Darrol Angel, M.D.    Ct Abdomen Pelvis W Contrast  12/24/2011  *RADIOLOGY REPORT*  Clinical Data: Nausea, vomiting, diarrhea and abdominal pain.  CT ABDOMEN AND PELVIS WITH CONTRAST  Technique:  Multidetector CT imaging of the abdomen and pelvis was performed following the standard protocol during bolus administration of intravenous contrast.  Contrast: OMNIPAQUE IOHEXOL 300 MG/ML  SOLN  Comparison: CT of the abdomen and pelvis 05/16/2009.  Findings:  Lung Bases: Cardiomegaly with left ventricular dilatation. Atherosclerotic calcifications in the left anterior descending, left circumflex and right coronary arteries.  Probable coronary artery stent to the right coronary artery.  Status post median sternotomy.  Pacemaker wires are seen extending into the right atrium, right ventricular apex, and into the coronary veins overlying the left lateral ventricle via the coronary sinus. There is no significant pericardial fluid, thickening or pericardial calcification.  Abdomen/Pelvis:  The gallbladder is distended, demonstrates some diffuse wall thickening and has some pericholecystic inflammatory changes, concerning for acute cholecystitis.  No radiopaque gallstones are confidently identified.  The common bile duct does not appear dilated.  The intrahepatic biliary tree also does not appear to be dilated.  There is a slightly shrunken and nodular contour to the liver, which suggests early morphologic changes of cirrhosis.  Portal vein does appear dilated (19 mm in diameter),  suggesting some degree of portal hypertension.  The enhanced appearance of the pancreas, spleen and bilateral adrenal glands is unremarkable.  There are multifocal areas of parenchymal thinning in the kidneys bilaterally (left greater than right), likely represent areas of post infectious/inflammatory scarring.  There is extensive atherosclerosis of the abdominal and pelvic vasculature, without evidence of aneurysm or dissection. There is a small amount of soft tissue stranding which extends down the right side of the retroperitoneum, and a trace amount of reactive fluid in the right pericolic gutter, all of which appear to be related to the inflammatory changes around the gallbladder. The no larger volume of ascites.  No pneumoperitoneum.  No pathologic distension of bowel.  Normal appendix.  Numerous colonic diverticula, including diverticulae in the region of the hepatic flexure of the colon.  The inflammatory changes associated with the gallbladder are intimately associated with the hepatic flexure of the colon, but the inflammatory changes do not appear to have their epicenter in the region of the hepatic flexure of the colon.  The remainder of the colonic diverticula show no evidence of inflammatory changes to suggest acute diverticulitis at this time. Mild thickening of the urinary bladder wall. The prostate gland appears mildly enlarged.  Musculoskeletal: Clips in the right inguinal region, likely from prior vascular access. There are no aggressive appearing lytic or blastic lesions noted in the visualized portions of the skeleton.  IMPRESSION: 1.  Moderate distension of the gallbladder with gallbladder wall thickening and pericholecystic inflammatory changes highly suspicious for acute cholecystitis.  No radiopaque gallstones are identified, however, and there are numerous colonic diverticula in the adjacent hepatic flexure of the colon.  Overall, the findings are strongly favored to represent acute  cholecystitis, but further evaluation with right upper quadrant ultrasound may be warranted to confirm this diagnosis, as an alternative diagnosis would be an acute hepatic flexure diverticulitis (not strongly favored). 2.  Extensive atherosclerosis, including at least three vessel coronary artery disease. 3.  Thickening of the urinary bladder wall.  Given mild prostate enlargement, this may be related to bladder outlet obstruction. However, correlation for signs and symptoms of urinary tract infection may be warranted. 4.  Findings suggestive of early cirrhosis and mild portal hypertension, as above.   Original Report Authenticated By: Florencia Reasons, M.D.    Dg Chest Port 1 View  12/24/2011  *RADIOLOGY REPORT*  Clinical Data: Well CHF  PORTABLE CHEST - 1 VIEW  Comparison: Prior chest x-ray yesterday, 12/23/2011  Findings: Low lung volumes creates crowding of the pulmonary vasculature.  There may be mild pulmonary vascular congestion bordering on interstitial edema incrementally increased compared to yesterday.  Cardiomegaly is unchanged.  Left subclavian approach biventricular cardiac rhythm maintenance device in unchanged position.  Surgical changes of prior median sternotomy for multivessel CABG.  IMPRESSION:  1.  Mild pulmonary vascular congestion bordering on interstitial edema, incrementally increased compared to yesterday. 2.  Unchanged cardiomegaly.   Original Report Authenticated By: Alvino Blood Chest Port 1 View  12/23/2011  *RADIOLOGY REPORT*  Clinical Data: Chest pain.  Shortness of breath.  Coronary artery disease.  PORTABLE CHEST - 1 VIEW  Comparison: 08/25/2011  Findings: Low lung volumes again demonstrated, however both lungs are clear.  Mild cardiomegaly stable.  AICD remains in stable position.  Prior CABG again noted.  IMPRESSION: Low lung volumes.  No acute findings.   Original Report Authenticated By: Danae Orleans, M.D.    Dg Shoulder Left  12/23/2011  *RADIOLOGY REPORT*  Clinical  Data: Fall.  Shoulder injury and pain.  LEFT SHOULDER - 2+ VIEW  Comparison: None.  Findings: No evidence of fracture or dislocation.  Mild degenerative spurring is seen involving the distal acromion process and inferior glenoid rim.  Pacemaker is also seen in the overlying chest wall soft tissues.  IMPRESSION:  1.  No acute findings. 2.  Mild degenerative spurring of the inferior glenoid and distal acromion.   Original Report Authenticated By: Danae Orleans, M.D.     Scheduled Meds:    . allopurinol  200 mg Oral Daily  . atorvastatin  40 mg Oral q1800  . carvedilol  25 mg Oral BID WC  . ciprofloxacin  400 mg Intravenous Q12H  . insulin aspart  0-9 Units Subcutaneous Q4H  . insulin glargine  15 Units Subcutaneous QHS  . iohexol  20 mL Oral Q1 Hr x 2  . magnesium sulfate 1 - 4 g bolus IVPB  2 g Intravenous Once  . metronidazole  500 mg Intravenous Q8H  .  niacin  1,000 mg Oral BID  . potassium chloride  40 mEq Oral Q4H  . ranolazine  1,000 mg Oral BID  . sodium chloride  3 mL Intravenous Q12H  . venlafaxine XR  150 mg Oral Daily  . DISCONTD: aspirin EC  81 mg Oral Daily  . DISCONTD: clopidogrel  75 mg Oral Q breakfast  . DISCONTD: enoxaparin (LOVENOX) injection  40 mg Subcutaneous Q24H  . DISCONTD: insulin aspart  0-15 Units Subcutaneous TID WC  . DISCONTD: insulin glargine  20 Units Subcutaneous QHS   Continuous Infusions:    . sodium chloride 40 mL/hr at 12/24/11 0841     [facility]  [facility] Pager [phone]. If 8PM-8AM, please contact night-coverage at [email or web address], password [facility] 12/25/2011, 7:51 AM  LOS: 2 days     Addendum  Discussed with the surgical team who indicated that patient needs to be off Aspirin and Plavix for 5 days prior to surgery. At this time they recommend discharging patient home on oral Cipro and patient will be seen by them in followup as outpatient and surgery will be performed electively. Will start low fat diet and monitor overnight. If  patient tolerates same, we'll discharge in a.m.  Estelle Skibicki 4:47 PM

## 2011-12-25 NOTE — Progress Notes (Signed)
Subjective: Lying in bed, comfortable no c/o sob or CP. Still with RLQ tenderness on exam. +BS, flatus, BM yesterday. Denies any N/V.  Objective: Vital signs in last 24 hours: Temp:  [97.1 F (36.2 C)-97.6 F (36.4 C)] 97.2 F (36.2 C) (09/09 0431) Pulse Rate:  [66-83] 80  (09/09 0431) Resp:  [18-20] 18  (09/09 0431) BP: (90-124)/(43-70) 90/43 mmHg (09/09 0431) SpO2:  [94 %-100 %] 96 % (09/09 0431) Weight:  [226 lb 6.4 oz (102.694 kg)] 226 lb 6.4 oz (102.694 kg) (09/09 0431) Last BM Date: 12/24/11  Intake/Output from previous day: 09/08 0701 - 09/09 0700 In: 360 [P.O.:360] Out: 4 [Urine:2; Stool:2] Intake/Output this shift:    General appearance: alert, cooperative, appears stated age and no distress Chest: CTA bilaterally on  2lpm secondary to "low sats" probably secondary to narcotics as he does not utilize )2 at home.  Cardiac: RRR, No M/R/G  Abdomen: soft, non distended appropriately tender RLQ, +BS and flatus. No N/V. 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 WBC wnl, H/H stable , INR 1.39 PLT 225, Troponins stable x 3 < 0.30 afebrile, VSS.  Lab Results:   Basename 12/25/11 0607 12/24/11 0500  WBC 10.1 13.0*  HGB 11.7* 12.6*  HCT 35.1* 36.8*  PLT 225 217   BMET  Basename 12/25/11 0607 12/24/11 0500  NA 133* 131*  K 4.2 3.3*  CL 97 93*  CO2 28 27  GLUCOSE 215* 192*  BUN 24* 26*  CREATININE 1.05 1.13  CALCIUM 9.1 8.9   PT/INR  Basename 12/25/11 0607 12/23/11 1930  LABPROT 17.3* 18.7*  INR 1.39 1.53*   ABG No results found for this basename: PHART:2,PCO2:2,PO2:2,HCO3:2 in the last 72 hours  Studies/Results: Ct Head Wo Contrast  12/23/2011  *RADIOLOGY REPORT*  Clinical Data: Dizziness.  Recurrent falls.  CT HEAD WITHOUT CONTRAST  Technique:  Contiguous axial images were obtained from the base of the skull through the vertex without contrast.  Comparison: None.  Findings:  There is no evidence of intracranial hemorrhage, brain edema or other signs of acute infarction.  There is no evidence of intracranial mass lesion or mass effect.  No abnormal extra-axial fluid collections are identified.  Mild diffuse cerebral atrophy noted.  No evidence of hydrocephalus. No evidence of skull fracture or other bone abnormality.  IMPRESSION:  1.  No acute intracranial abnormality. 2.  Mild diffuse cerebral atrophy.   Original Report Authenticated By: Danae Orleans, M.D.    US Abdomen Complete  12/24/2011  *RADIOLOGY REPORT*  Clinical Data:  Abdominal pain, nausea, vomiting and diarrhea. Findings on an abdomen CT earlier today highly suspicious for acute cholecystitis without visible gallstones.  COMPLETE ABDOMINAL ULTRASOUND  Comparison:  Abdomen CT obtained earlier today.  Renal ultrasound dated 07/28/2011.  Findings:  Gallbladder:  Gallstones and sludge in the gallbladder.  The largest individual stone measures 9 mm in maximum diameter. Diffuse gallbladder wall thickening with a maximum thickness of 13.1 mm.  Small amount of pericholecystic fluid.  The patient was focally tender over the gallbladder.  Common bile duct:  Limited visualization.  The visualized portion measures 5.4 mm in diameter proximally.  Liver:  Mildly heterogeneous, with corresponding low density on the recent CT.  IVC:  Appears normal.  Pancreas:  Limited visualization.  The visualized portions appear normal and the remainder of the pancreas appear normal on the CT earlier today.  Spleen:  Normal, measuring 7.7  cm in length.  Right Kidney:  Mild fetal lobulations.  Otherwise, normal, measuring 12.6 cm in length.  Left Kidney:  Mild fetal lobulations.  Otherwise, normal, measuring 13.4 cm in length.  Abdominal aorta:  Poorly visualized.  The visualized portions are normal in caliber.  The remainder was visualized and normal in caliber on the CT.  IMPRESSION:  1.  Acute cholecystitis with cholelithiasis, sludge in the  gallbladder, diffuse gallbladder wall thickening, mild pericholecystic fluid and focal tenderness over the gallbladder. 2.  Diffuse hepatic steatosis.   Original Report Authenticated By: Darrol Angel, M.D.    Ct Abdomen Pelvis W Contrast  12/24/2011  *RADIOLOGY REPORT*  Clinical Data: Nausea, vomiting, diarrhea and abdominal pain.  CT ABDOMEN AND PELVIS WITH CONTRAST  Technique:  Multidetector CT imaging of the abdomen and pelvis was performed following the standard protocol during bolus administration of intravenous contrast.  Contrast: OMNIPAQUE IOHEXOL 300 MG/ML  SOLN  Comparison: CT of the abdomen and pelvis 05/16/2009.  Findings:  Lung Bases: Cardiomegaly with left ventricular dilatation. Atherosclerotic calcifications in the left anterior descending, left circumflex and right coronary arteries.  Probable coronary artery stent to the right coronary artery.  Status post median sternotomy.  Pacemaker wires are seen extending into the right atrium, right ventricular apex, and into the coronary veins overlying the left lateral ventricle via the coronary sinus. There is no significant pericardial fluid, thickening or pericardial calcification.  Abdomen/Pelvis:  The gallbladder is distended, demonstrates some diffuse wall thickening and has some pericholecystic inflammatory changes, concerning for acute cholecystitis.  No radiopaque gallstones are confidently identified.  The common bile duct does not appear dilated.  The intrahepatic biliary tree also does not appear to be dilated.  There is a slightly shrunken and nodular contour to the liver, which suggests early morphologic changes of cirrhosis.  Portal vein does appear dilated (19 mm in diameter), suggesting some degree of portal hypertension.  The enhanced appearance of the pancreas, spleen and bilateral adrenal glands is unremarkable.  There are multifocal areas of parenchymal thinning in the kidneys bilaterally (left greater than right), likely  represent areas of post infectious/inflammatory scarring.  There is extensive atherosclerosis of the abdominal and pelvic vasculature, without evidence of aneurysm or dissection. There is a small amount of soft tissue stranding which extends down the right side of the retroperitoneum, and a trace amount of reactive fluid in the right pericolic gutter, all of which appear to be related to the inflammatory changes around the gallbladder. The no larger volume of ascites.  No pneumoperitoneum.  No pathologic distension of bowel.  Normal appendix.  Numerous colonic diverticula, including diverticulae in the region of the hepatic flexure of the colon.  The inflammatory changes associated with the gallbladder are intimately associated with the hepatic flexure of the colon, but the inflammatory changes do not appear to have their epicenter in the region of the hepatic flexure of the colon.  The remainder of the colonic diverticula show no evidence of inflammatory changes to suggest acute diverticulitis at this time. Mild thickening of the urinary bladder wall. The prostate gland appears mildly enlarged.  Musculoskeletal: Clips in the right inguinal region, likely from prior vascular access. There are no aggressive appearing lytic or blastic lesions noted in the visualized portions of the skeleton.  IMPRESSION: 1.  Moderate distension of the gallbladder with gallbladder wall thickening and pericholecystic inflammatory changes highly suspicious for acute cholecystitis.  No radiopaque gallstones are identified, however, and there are numerous colonic  diverticula in the adjacent hepatic flexure of the colon.  Overall, the findings are strongly favored to represent acute cholecystitis, but further evaluation with right upper quadrant ultrasound may be warranted to confirm this diagnosis, as an alternative diagnosis would be an acute hepatic flexure diverticulitis (not strongly favored). 2.  Extensive atherosclerosis, including  at least three vessel coronary artery disease. 3.  Thickening of the urinary bladder wall.  Given mild prostate enlargement, this may be related to bladder outlet obstruction. However, correlation for signs and symptoms of urinary tract infection may be warranted. 4.  Findings suggestive of early cirrhosis and mild portal hypertension, as above.   Original Report Authenticated By: Florencia Reasons, M.D.    Dg Chest Port 1 View  12/24/2011  *RADIOLOGY REPORT*  Clinical Data: Well CHF  PORTABLE CHEST - 1 VIEW  Comparison: Prior chest x-ray yesterday, 12/23/2011  Findings: Low lung volumes creates crowding of the pulmonary vasculature.  There may be mild pulmonary vascular congestion bordering on interstitial edema incrementally increased compared to yesterday.  Cardiomegaly is unchanged.  Left subclavian approach biventricular cardiac rhythm maintenance device in unchanged position.  Surgical changes of prior median sternotomy for multivessel CABG.  IMPRESSION:  1.  Mild pulmonary vascular congestion bordering on interstitial edema, incrementally increased compared to yesterday. 2.  Unchanged cardiomegaly.   Original Report Authenticated By: Alvino Blood Chest Port 1 View  12/23/2011  *RADIOLOGY REPORT*  Clinical Data: Chest pain.  Shortness of breath.  Coronary artery disease.  PORTABLE CHEST - 1 VIEW  Comparison: 08/25/2011  Findings: Low lung volumes again demonstrated, however both lungs are clear.  Mild cardiomegaly stable.  AICD remains in stable position.  Prior CABG again noted.  IMPRESSION: Low lung volumes.  No acute findings.   Original Report Authenticated By: Danae Orleans, M.D.    Dg Shoulder Left  12/23/2011  *RADIOLOGY REPORT*  Clinical Data: Fall.  Shoulder injury and pain.  LEFT SHOULDER - 2+ VIEW  Comparison: None.  Findings: No evidence of fracture or dislocation.  Mild degenerative spurring is seen involving the distal acromion process and inferior glenoid rim.  Pacemaker is also seen in  the overlying chest wall soft tissues.  IMPRESSION:  1.  No acute findings. 2.  Mild degenerative spurring of the inferior glenoid and distal acromion.   Original Report Authenticated By: Danae Orleans, M.D.     Anti-infectives: Anti-infectives     Start     Dose/Rate Route Frequency Ordered Stop   12/24/11 1500   metroNIDAZOLE (FLAGYL) IVPB 500 mg        500 mg 100 mL/hr over 60 Minutes Intravenous Every 8 hours 12/24/11 1259     12/24/11 1400   ciprofloxacin (CIPRO) IVPB 400 mg        400 mg 200 mL/hr over 60 Minutes Intravenous Every 12 hours 12/24/11 1259            Assessment/Plan: Patient Active Problem List  Diagnosis  . Ischemic cardiomyopathy  . Hyperlipemia  . CAD (coronary artery disease)  . HTN (hypertension)  . DM (diabetes mellitus)  . Nausea vomiting and diarrhea  . Dizziness  . Preop cardiovascular exam  . Acute cholecystitis    s/p * No surgery found * 1. Continue IVF, ABX 2. NPO 3. Will need to be off both his Plavix and ASA for at least 5 days pre-op.  No surgical intervention at this time, secondary to #3. 4. Recommend that he kept on po  Cipro for at least 1 week pre-op. 4. Management of his other medical problems per medicine team. 5. OOB, ambulate, SCD's. 6. Will add protonix 7. If okay with medicine and cardiology he is clear for discharge to home from a surgical standpoint, with the above regimen; and can then see our office in 5-7 days for a follow up visit and scheduling of an elective cholecystectomy with Intra operative cholangiogram. The above plan was discussed with the patient and his wife and they are in agreement with this approach.    LOS: 2 days    Dharma Pare 12/25/2011

## 2011-12-25 NOTE — Evaluation (Signed)
Physical Therapy Evaluation Patient Details Name: [name] MRN: [id number] DOB: 1941/01/12 Today's Date: 12/25/2011 Time: 7829-5621 PT Time Calculation (min): 18 min  PT Assessment / Plan / Recommendation Clinical Impression  71 yo adm with orthostasis due to dehydration from N/V and found to have cholecystitis. Pt currently at baseline for mobility with no skilled PT needs identified.    PT Assessment  Patent does not need any further PT services    Follow Up Recommendations  No PT follow up    Barriers to Discharge        Equipment Recommendations  None recommended by PT    Recommendations for Other Services     Frequency      Precautions / Restrictions Precautions Precautions: None   Pertinent Vitals/Pain Orthostatic vital signs on doc flowsheets--pt was not orthostatic or symptomatic SaO2 91-93% on RA during ambulation      Mobility  Bed Mobility Bed Mobility: Right Sidelying to Sit;Sitting - Scoot to Edge of Bed Right Sidelying to Sit: 6: Modified independent (Device/Increase time);HOB flat Sitting - Scoot to Edge of Bed: 7: Independent Transfers Transfers: Sit to Stand;Stand to Sit Sit to Stand: 7: Independent Stand to Sit: 7: Independent Details for Transfer Assistance: denied dizziness Ambulation/Gait Ambulation/Gait Assistance: 7: Independent Ambulation Distance (Feet): 200 Feet Assistive device: None Gait Pattern: Within Functional Limits    Exercises     PT Diagnosis:    PT Problem List:   PT Treatment Interventions:     PT Goals    Visit Information  Last PT Received On: 12/25/11 Assistance Needed: +1    Subjective Data  Subjective: Reports he sometimes feels unsteady, but never to the point of near-falling Patient Stated Goal: be able to eat (NPO for testing)   Prior Functioning  Home Living Lives With: Spouse Available Help at Discharge: Family;Available 24 hours/day Type of Home: House Home Access: Level entry Home Layout: One  level Bathroom Shower/Tub: Health visitor: Handicapped height Bathroom Accessibility: Yes How Accessible: Accessible via walker Home Adaptive Equipment: Straight cane;Walker - rolling;Shower chair with back Prior Function Level of Independence: Independent Able to Take Stairs?: Yes Driving: Yes Vocation: Retired Musician: No difficulties    Cognition  Overall Cognitive Status: Appears within functional limits for tasks assessed/performed Arousal/Alertness: Awake/alert Orientation Level: Appears intact for tasks assessed Behavior During Session: [facility] for tasks performed    Extremity/Trunk Assessment Right Lower Extremity Assessment RLE ROM/Strength/Tone: [facility] for tasks assessed Left Lower Extremity Assessment LLE ROM/Strength/Tone: [facility] for tasks assessed   Balance    End of Session PT - End of Session Activity Tolerance: Patient tolerated treatment well Patient left: in chair;with call bell/phone within reach  GP     Zenab Gronewold 12/25/2011, 12:14 PM  Pager [phone]

## 2011-12-25 NOTE — Progress Notes (Signed)
Cosign for Jeremy Johann RN's assessment, I/O, note(s), med administration and care plan/education.  Lorretta Harp RN

## 2011-12-25 NOTE — Progress Notes (Signed)
Need to wait a minimum of five days off Plavix prior to surgical treatment.  This patient has been seen and I agree with the findings and treatment plan.  Marta Lamas. Gae Bon, MD, FACS [phone] (pager) [phone] (direct pager) Trauma Surgeon

## 2011-12-26 ENCOUNTER — Telehealth (INDEPENDENT_AMBULATORY_CARE_PROVIDER_SITE_OTHER): Payer: Self-pay | Admitting: General Surgery

## 2011-12-26 DIAGNOSIS — I2510 Atherosclerotic heart disease of native coronary artery without angina pectoris: Secondary | ICD-10-CM | POA: Diagnosis not present

## 2011-12-26 DIAGNOSIS — R112 Nausea with vomiting, unspecified: Secondary | ICD-10-CM | POA: Diagnosis not present

## 2011-12-26 DIAGNOSIS — E119 Type 2 diabetes mellitus without complications: Secondary | ICD-10-CM | POA: Diagnosis not present

## 2011-12-26 DIAGNOSIS — K810 Acute cholecystitis: Secondary | ICD-10-CM | POA: Diagnosis not present

## 2011-12-26 LAB — CBC WITH DIFFERENTIAL/PLATELET
Basophils Absolute: 0 10*3/uL (ref 0.0–0.1)
Eosinophils Absolute: 0 10*3/uL (ref 0.0–0.7)
Lymphs Abs: 0.8 10*3/uL (ref 0.7–4.0)
MCH: 29.1 pg (ref 26.0–34.0)
MCHC: 33.8 g/dL (ref 30.0–36.0)
MCV: 86.2 fL (ref 78.0–100.0)
Monocytes Absolute: 1.8 10*3/uL — ABNORMAL HIGH (ref 0.1–1.0)
Neutrophils Relative %: 84 % — ABNORMAL HIGH (ref 43–77)
Platelets: 257 10*3/uL (ref 150–400)
RDW: 16.6 % — ABNORMAL HIGH (ref 11.5–15.5)

## 2011-12-26 LAB — GLUCOSE, CAPILLARY: Glucose-Capillary: 163 mg/dL — ABNORMAL HIGH (ref 70–99)

## 2011-12-26 LAB — COMPREHENSIVE METABOLIC PANEL
ALT: 19 U/L (ref 0–53)
AST: 21 U/L (ref 0–37)
Albumin: 2.5 g/dL — ABNORMAL LOW (ref 3.5–5.2)
Calcium: 8.7 mg/dL (ref 8.4–10.5)
GFR calc Af Amer: >90 mL/min (ref >90)
Glucose, Bld: 140 mg/dL — ABNORMAL HIGH (ref 70–99)
Sodium: 131 mEq/L — ABNORMAL LOW (ref 135–145)
Total Protein: 6.5 g/dL (ref 6.0–8.3)

## 2011-12-26 MED ORDER — INSULIN ASPART 100 UNIT/ML ~~LOC~~ SOLN
0.0000 [IU] | Freq: Three times a day (TID) | SUBCUTANEOUS | Status: DC
  Administered 2011-12-26: 2 [IU] via SUBCUTANEOUS

## 2011-12-26 MED ORDER — CIPROFLOXACIN HCL 500 MG PO TABS: 500.0000 mg | ORAL_TABLET | Freq: Two times a day (BID) | ORAL | Status: DC

## 2011-12-26 NOTE — Progress Notes (Signed)
12445 discharge instructions given to pt and spouse . Both verbalized understanding. To lobby  Via wheelchair by volunteer

## 2011-12-26 NOTE — Progress Notes (Signed)
Patient ID: [name], male   DOB: 31-Jul-1940, 71 y.o.   MRN: [id number]    Subjective: Sitting on side of bed off oxygen, comfortable no c/o RUQ pain or Nausea.  Objective: Vital signs in last 24 hours: Temp:  [97.5 F (36.4 C)-97.8 F (36.6 C)] 97.8 F (36.6 C) (09/10 0504) Pulse Rate:  [70-86] 79  (09/10 0633) Resp:  [19-20] 19  (09/10 0504) BP: (100-126)/(55-76) 115/61 mmHg (09/10 0633) SpO2:  [91 %-98 %] 98 % (09/10 0504) Weight:  [227 lb 15.3 oz (103.4 kg)] 227 lb 15.3 oz (103.4 kg) (09/10 0504) Last BM Date: 12/24/11  Intake/Output from previous day: 09/09 0701 - 09/10 0700 In: 360 [P.O.:360] Out: -  Intake/Output this shift:    General appearance: alert, cooperative, appears stated age and no distress Chest: CTA bilaterally  Cardiac: RRR, No M/R/G  Abdomen: soft, non distended, + BS, No pain or nausea . 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 WBC has trended up with left shift, but he is afebrile; continues on ABX. Hyponatremic, creatinine wnl.   Lab Results:   [facility] 12/26/11 0547 12/25/11 0607  WBC 16.3* 10.1  HGB 12.0* 11.7*  HCT 35.5* 35.1*  PLT 257 225   BMET  Basename 12/26/11 0547 12/25/11 0607  NA 131* 133*  K 4.2 4.2  CL 96 97  CO2 23 28  GLUCOSE 140* 215*  BUN 19 24*  CREATININE 0.98 1.05  CALCIUM 8.7 9.1   PT/INR  Basename 12/25/11 0607 12/23/11 1930  LABPROT 17.3* 18.7*  INR 1.39 1.53*   ABG No results found for this basename: PHART:2,PCO2:2,PO2:2,HCO3:2 in the last 72 hours  Studies/Results: US Abdomen Complete  12/24/2011  *RADIOLOGY REPORT*  Clinical Data:  Abdominal pain, nausea, vomiting and diarrhea. Findings on an abdomen CT earlier today highly suspicious for acute cholecystitis without visible gallstones.  COMPLETE ABDOMINAL ULTRASOUND  Comparison:  Abdomen CT obtained earlier today.  Renal ultrasound dated 07/28/2011.  Findings:   Gallbladder:  Gallstones and sludge in the gallbladder.  The largest individual stone measures 9 mm in maximum diameter. Diffuse gallbladder wall thickening with a maximum thickness of 13.1 mm.  Small amount of pericholecystic fluid.  The patient was focally tender over the gallbladder.  Common bile duct:  Limited visualization.  The visualized portion measures 5.4 mm in diameter proximally.  Liver:  Mildly heterogeneous, with corresponding low density on the recent CT.  IVC:  Appears normal.  Pancreas:  Limited visualization.  The visualized portions appear normal and the remainder of the pancreas appear normal on the CT earlier today.  Spleen:  Normal, measuring 7.7 cm in length.  Right Kidney:  Mild fetal lobulations.  Otherwise, normal, measuring 12.6 cm in length.  Left Kidney:  Mild fetal lobulations.  Otherwise, normal, measuring 13.4 cm in length.  Abdominal aorta:  Poorly visualized.  The visualized portions are normal in caliber.  The remainder was visualized and normal in caliber on the CT.  IMPRESSION:  1.  Acute cholecystitis with cholelithiasis, sludge in the gallbladder, diffuse gallbladder wall thickening, mild pericholecystic fluid and focal tenderness over the gallbladder. 2.  Diffuse hepatic steatosis.   Original Report Authenticated By: Darrol Angel, M.D.    Ct Abdomen Pelvis W Contrast  12/24/2011  *RADIOLOGY REPORT*  Clinical Data: Nausea, vomiting, diarrhea and abdominal pain.  CT ABDOMEN AND PELVIS WITH CONTRAST  Technique:  Multidetector CT imaging of the  abdomen and pelvis was performed following the standard protocol during bolus administration of intravenous contrast.  Contrast: OMNIPAQUE IOHEXOL 300 MG/ML  SOLN  Comparison: CT of the abdomen and pelvis 05/16/2009.  Findings:  Lung Bases: Cardiomegaly with left ventricular dilatation. Atherosclerotic calcifications in the left anterior descending, left circumflex and right coronary arteries.  Probable coronary artery stent to  the right coronary artery.  Status post median sternotomy.  Pacemaker wires are seen extending into the right atrium, right ventricular apex, and into the coronary veins overlying the left lateral ventricle via the coronary sinus. There is no significant pericardial fluid, thickening or pericardial calcification.  Abdomen/Pelvis:  The gallbladder is distended, demonstrates some diffuse wall thickening and has some pericholecystic inflammatory changes, concerning for acute cholecystitis.  No radiopaque gallstones are confidently identified.  The common bile duct does not appear dilated.  The intrahepatic biliary tree also does not appear to be dilated.  There is a slightly shrunken and nodular contour to the liver, which suggests early morphologic changes of cirrhosis.  Portal vein does appear dilated (19 mm in diameter), suggesting some degree of portal hypertension.  The enhanced appearance of the pancreas, spleen and bilateral adrenal glands is unremarkable.  There are multifocal areas of parenchymal thinning in the kidneys bilaterally (left greater than right), likely represent areas of post infectious/inflammatory scarring.  There is extensive atherosclerosis of the abdominal and pelvic vasculature, without evidence of aneurysm or dissection. There is a small amount of soft tissue stranding which extends down the right side of the retroperitoneum, and a trace amount of reactive fluid in the right pericolic gutter, all of which appear to be related to the inflammatory changes around the gallbladder. The no larger volume of ascites.  No pneumoperitoneum.  No pathologic distension of bowel.  Normal appendix.  Numerous colonic diverticula, including diverticulae in the region of the hepatic flexure of the colon.  The inflammatory changes associated with the gallbladder are intimately associated with the hepatic flexure of the colon, but the inflammatory changes do not appear to have their epicenter in the region of  the hepatic flexure of the colon.  The remainder of the colonic diverticula show no evidence of inflammatory changes to suggest acute diverticulitis at this time. Mild thickening of the urinary bladder wall. The prostate gland appears mildly enlarged.  Musculoskeletal: Clips in the right inguinal region, likely from prior vascular access. There are no aggressive appearing lytic or blastic lesions noted in the visualized portions of the skeleton.  IMPRESSION: 1.  Moderate distension of the gallbladder with gallbladder wall thickening and pericholecystic inflammatory changes highly suspicious for acute cholecystitis.  No radiopaque gallstones are identified, however, and there are numerous colonic diverticula in the adjacent hepatic flexure of the colon.  Overall, the findings are strongly favored to represent acute cholecystitis, but further evaluation with right upper quadrant ultrasound may be warranted to confirm this diagnosis, as an alternative diagnosis would be an acute hepatic flexure diverticulitis (not strongly favored). 2.  Extensive atherosclerosis, including at least three vessel coronary artery disease. 3.  Thickening of the urinary bladder wall.  Given mild prostate enlargement, this may be related to bladder outlet obstruction. However, correlation for signs and symptoms of urinary tract infection may be warranted. 4.  Findings suggestive of early cirrhosis and mild portal hypertension, as above.   Original Report Authenticated By: Florencia Reasons, M.D.     Anti-infectives: Anti-infectives     Start     Dose/Rate Route Frequency Ordered  Stop   12/26/11 0000   ciprofloxacin (CIPRO) 500 MG tablet        500 mg Oral 2 times daily 12/26/11 0758 01/05/12 2359   12/24/11 1500   metroNIDAZOLE (FLAGYL) IVPB 500 mg  Status:  Discontinued        500 mg 100 mL/hr over 60 Minutes Intravenous Every 8 hours 12/24/11 1259 12/25/11 1648   12/24/11 1400   ciprofloxacin (CIPRO) IVPB 400 mg        400  mg 200 mL/hr over 60 Minutes Intravenous Every 12 hours 12/24/11 1259            Assessment/Plan: Patient Active Problem List  Diagnosis  . Ischemic cardiomyopathy  . Hyperlipemia  . CAD (coronary artery disease)  . HTN (hypertension)  . DM (diabetes mellitus)  . Nausea vomiting and diarrhea  . Dizziness  . Preop cardiovascular exam  . Acute cholecystitis    s/p * No surgery found * 1. Continue  ABX 2. Will need to be off both his Plavix for at least 5 days pre-op.  Recommend continuing with ASA 81 mg No surgical intervention at this time, secondary to #3. 3. Recommend that he kept on po Cipro for at least 1 week pre-op. 4. Management of his other medical problems per medicine team. 5. If okay with medicine and cardiology he is clear for discharge to home from a surgical standpoint, with the above regimen; and can then see our office in 5-7 days for a follow up visit and scheduling of an elective cholecystectomy with Intra operative cholangiogram. The above plan was discussed with the patient and his wife and they are in agreement with this approach.    LOS: 3 days    Caci Orren 12/26/2011

## 2011-12-26 NOTE — Progress Notes (Signed)
Patient needs to be off Plavix for at least 5 days.  Can stay on ASA.  F/U Dr. Corliss Skains or Biagio Quint.  Will need cholecystectomy in the future.  [name]. Gae Bon, MD, FACS [phone] [facility]

## 2011-12-26 NOTE — Telephone Encounter (Signed)
JEREMY CALLED YESTERDAY REQUESTING I CONTACT PT WITH AN APPOINTMENT FOR NEXT WEEK TO BE EVALUATED OR GALLBLADDER SURGERY. PT IS STILL IN HOSPITAL SO I SPOKE WITH HIS WIFE PAULA AND SCHEDULED HIM TO SEE DR. T. CORNETT ON 01-02-12/ I ALSO NOTIFIED JEREMY OF SCHEDULED APPOINTMENT/GY

## 2011-12-26 NOTE — Discharge Summary (Signed)
Physician Discharge Summary  [name] ZOX:[id number] DOB: [birth year] DOA: 12/23/2011  PCP: Lupita Raider, MD  Admit date: 12/23/2011 Discharge date: 12/26/2011  Recommendations for Outpatient Follow-up:  1. Followup with Dr. Birdie Sons, general surgery in 5-7 days from hospital discharge. 2. Followup with Primary Medical Doctor. 3. Followup with Dr. Armanda Magic, cardiology.  Discharge Diagnoses:  Principal Problem:  *Acute cholecystitis Active Problems:  Ischemic cardiomyopathy  CAD (coronary artery disease)  Nausea vomiting and diarrhea  Dizziness  Preop cardiovascular exam   Discharge Condition: Improved and stable  Diet recommendation: Heart healthy and diabetic  Filed Weights   12/24/11 0126 12/25/11 0431 12/26/11 0504  Weight: 100.88 kg (222 lb 6.4 oz) 102.694 kg (226 lb 6.4 oz) 103.4 kg (227 lb 15.3 oz)    History of present illness:  71 year old male with history of ischemic cardiomyopathy last EF measured was 30%, diabetes mellitus type 2, hypertension, AICD placement presented the ER because of nausea vomiting and dizziness. Patient has been having nausea and vomiting with diarrhea last 2-3 days. Denies any use of antibiotics or recent hospitalization. In addition patient also complains of right lower quadrant pain. Yesterday patient felt weak and dizzy but it was standing. In the ER patient was found to be orthostatic. Was given IV fluid bolus and has been admitted for further management. Further evaluation revealed acute cholecystitis.   Hospital Course:  1. Acute cholecystitis with cholelithiasis: CT scan and right upper quadrant ultrasound confirmed clinical suspicion of acute cholecystitis. Surgeon's were consulted. Patient was made n.p.o. and started empirically on IV Cipro and Flagyl. Cardiology consulted for preop clearance and determined that he was stable from a CV standpoint for surgery. Patient had been on aspirin and Plavix until 12/24/2011. Surgery  evaluated him again yesterday and indicated that he needs to be off both aspirin and Plavix for at least 5 days prior to surgery. They cleared him for discharge home on oral Cipro and followup in their offices in 5-7 days time to plan for elective cholecystectomy. The patient was kept overnight and his diet was resumed which she has tolerated without any pain, nausea or vomiting or fever. Although he has leukocytosis, he is afebrile, not toxic looking with no acute abdominal signs. He will be discharged home for outpatient followup. Patient at moderate risk for perioperative CV events. Recommend continuing perioperative beta blockers when surgery is done. 2. Nausea, vomiting, diarrhea and abdominal pain: Possibly secondary to problem #1. Resolved. Tolerating diet. C. difficile PCR negative 3. CAD status post CABG/ischemic cardiomyopathy/pacemaker/AICD: Patient denies chest pain. Dyspnea at baseline. Cardiac enzymes showed solitary elevation of troponin of 0.35 but 2 troponin since have been negative-unclear significance. Cardiology consulted. Continue all prior home cardiac medications except aspirin and Plavix. Aspirin and Plavix are to be resumed post surgery as soon as possible as determined by the surgeons. 4. Diabetes mellitus type 2: Continue home dose of Lantus and Apidra. He is on high doses of these and is advised regarding monitoring for hypoglycemic episodes and if so considering tapering down of his dose in coordination with his primary medical doctor. He and spouse verbalized understanding. 5. Hypokalemia: Repleted 6. NSVT: Patient has AICD. Continue carvedilol. Status post magnesium and potassium supplements on 9/8. 7. Hyponatremia: Possibly multifactorial secondary to congestive heart failure and dehydration. Stable. 8. Orthostatic hypotension:? Secondary to dehydration. Patient was briefly hydrated with IV fluids and Lasix was temporarily held.  9. Chronic systolic congestive heart failure:  Clinically compensated. Resumed Lasix at discharge. 10. Anemia: Stable.  11. Leukocytosis: Secondary to problem #1. Continue oral Cipro 12. 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  Procedures:  None.  Consultations:  [place] [facility].  General surgery.  Discharge Exam:  Complaints Denies abdominal pain, nausea or vomiting even after regular meals. One episode of diarrhea this morning. Denies dyspnea or chest pain.   Filed Vitals:   12/25/11 1645 12/25/11 1955 12/26/11 0504 12/26/11 0633  BP: 120/61 126/76 119/64 115/61  Pulse: 78 85 86 79  Temp:  97.7 F (36.5 C) 97.8 F (36.6 C)   TempSrc:  Oral Oral   Resp:   19   Height:      Weight:   103.4 kg (227 lb 15.3 oz)   SpO2:  97% 98%     General exam: Moderately built and nourished male, in no obvious distress. Lying comfortably supine in bed  Respiratory system: Clear to auscultation. No increased work of breathing.  Cardiovascular system: S1 and S2 heard, regular rate and rhythm. No JVD, murmurs or gallops. Trace bilateral leg edema. Telemetry shows V paced rhythm. Gastrointestinal system: Abdomen is obese, soft and mild right upper quadrant tenderness at this time without any rigidity guarding or rebound. No organomegaly or masses appreciated. Bowel sounds are normally heard.  Central nervous system: Alert and oriented. No focal deficits.  Extremities: Symmetric 5 x 5 power.   Discharge Instructions  Discharge Orders    Future Appointments: Provider: Department: Dept Phone: Center:   03/04/2012 10:00 AM [phone] [facility]     Future Orders Please Complete By Expires   Diet - low sodium heart healthy      Diet Carb Modified      Increase activity slowly      Call MD for:  temperature >100.4      Call MD for:  persistant nausea and vomiting      Call MD for:  severe uncontrolled pain      Call MD for:  difficulty breathing, headache or visual  disturbances      (HEART FAILURE PATIENTS) Call MD:  Anytime you have any of the following symptoms: 1) 3 pound weight gain in 24 hours or 5 pounds in 1 week 2) shortness of breath, with or without a dry hacking cough 3) swelling in the hands, feet or stomach 4) if you have to sleep on extra pillows at night in order to breathe.        Medication List  As of 12/26/2011  7:58 AM   STOP taking these medications         aspirin EC 81 MG tablet      clopidogrel 75 MG tablet         TAKE these medications         allopurinol 100 MG tablet   Commonly known as: ZYLOPRIM   Take 200 mg by mouth daily.      atorvastatin 40 MG tablet   Commonly known as: LIPITOR   Take 40 mg by mouth daily.      carvedilol 25 MG tablet   Commonly known as: COREG   Take 25 mg by mouth 2 (two) times daily.      ciprofloxacin 500 MG tablet   Commonly known as: CIPRO   Take 1 tablet (500 mg total) by mouth 2 (two) times daily.      eplerenone 25 MG tablet   Commonly known as: INSPRA   Take 25 mg by mouth daily.  furosemide 40 MG tablet   Commonly known as: LASIX   Take 40 mg by mouth 2 (two) times daily.      insulin glargine 100 UNIT/ML injection   Commonly known as: LANTUS   Inject 35 Units into the skin 2 (two) times daily.      insulin glulisine 100 UNIT/ML injection   Commonly known as: APIDRA   Inject 40 Units into the skin 2 (two) times daily.      niacin 500 MG CR tablet   Commonly known as: NIASPAN   Take 1,000 mg by mouth 2 (two) times daily.      nitroGLYCERIN 0.4 MG SL tablet   Commonly known as: NITROSTAT   Place 0.4 mg under the tongue every 5 (five) minutes x 3 doses as needed. For chest pain.      ranolazine 500 MG 12 hr tablet   Commonly known as: RANEXA   Take 1,000 mg by mouth 2 (two) times daily.      traMADol 50 MG tablet   Commonly known as: ULTRAM   Take 50 mg by mouth every 6 (six) hours as needed. For pain.      triamcinolone cream 0.1 %   Commonly known  as: KENALOG   Apply 1 application topically daily as needed. Apply sparingly to affected area externally two times a day as needed for itching and rash      venlafaxine XR 150 MG 24 hr capsule   Commonly known as: EFFEXOR-XR   Take 150 mg by mouth daily.      zolpidem 12.5 MG CR tablet   Commonly known as: AMBIEN CR   Take 12.5 mg by mouth at bedtime as needed. For sleep              The results of significant diagnostics from this hospitalization (including imaging, microbiology, ancillary and laboratory) are listed below for reference.    Significant Diagnostic Studies: Ct Head Wo Contrast  12/23/2011  *RADIOLOGY REPORT*  Clinical Data: Dizziness.  Recurrent falls.  CT HEAD WITHOUT CONTRAST  Technique:  Contiguous axial images were obtained from the base of the skull through the vertex without contrast.  Comparison: None.  Findings: There is no evidence of intracranial hemorrhage, brain edema or other signs of acute infarction.  There is no evidence of intracranial mass lesion or mass effect.  No abnormal extra-axial fluid collections are identified.  Mild diffuse cerebral atrophy noted.  No evidence of hydrocephalus. No evidence of skull fracture or other bone abnormality.  IMPRESSION:  1.  No acute intracranial abnormality. 2.  Mild diffuse cerebral atrophy.   Original Report Authenticated By: Danae Orleans, M.D.    US Abdomen Complete  12/24/2011  *RADIOLOGY REPORT*  Clinical Data:  Abdominal pain, nausea, vomiting and diarrhea. Findings on an abdomen CT earlier today highly suspicious for acute cholecystitis without visible gallstones.  COMPLETE ABDOMINAL ULTRASOUND  Comparison:  Abdomen CT obtained earlier today.  Renal ultrasound dated 07/28/2011.  Findings:  Gallbladder:  Gallstones and sludge in the gallbladder.  The largest individual stone measures 9 mm in maximum diameter. Diffuse gallbladder wall thickening with a maximum thickness of 13.1 mm.  Small amount of pericholecystic  fluid.  The patient was focally tender over the gallbladder.  Common bile duct:  Limited visualization.  The visualized portion measures 5.4 mm in diameter proximally.  Liver:  Mildly heterogeneous, with corresponding low density on the recent CT.  IVC:  Appears normal.  Pancreas:  Limited visualization.  The visualized portions appear normal and the remainder of the pancreas appear normal on the CT earlier today.  Spleen:  Normal, measuring 7.7 cm in length.  Right Kidney:  Mild fetal lobulations.  Otherwise, normal, measuring 12.6 cm in length.  Left Kidney:  Mild fetal lobulations.  Otherwise, normal, measuring 13.4 cm in length.  Abdominal aorta:  Poorly visualized.  The visualized portions are normal in caliber.  The remainder was visualized and normal in caliber on the CT.  IMPRESSION:  1.  Acute cholecystitis with cholelithiasis, sludge in the gallbladder, diffuse gallbladder wall thickening, mild pericholecystic fluid and focal tenderness over the gallbladder. 2.  Diffuse hepatic steatosis.   Original Report Authenticated By: Darrol Angel, M.D.    Ct Abdomen Pelvis W Contrast  12/24/2011  *RADIOLOGY REPORT*  Clinical Data: Nausea, vomiting, diarrhea and abdominal pain.  CT ABDOMEN AND PELVIS WITH CONTRAST  Technique:  Multidetector CT imaging of the abdomen and pelvis was performed following the standard protocol during bolus administration of intravenous contrast.  Contrast: OMNIPAQUE IOHEXOL 300 MG/ML  SOLN  Comparison: CT of the abdomen and pelvis 05/16/2009.  Findings:  Lung Bases: Cardiomegaly with left ventricular dilatation. Atherosclerotic calcifications in the left anterior descending, left circumflex and right coronary arteries.  Probable coronary artery stent to the right coronary artery.  Status post median sternotomy.  Pacemaker wires are seen extending into the right atrium, right ventricular apex, and into the coronary veins overlying the left lateral ventricle via the coronary  sinus. There is no significant pericardial fluid, thickening or pericardial calcification.  Abdomen/Pelvis:  The gallbladder is distended, demonstrates some diffuse wall thickening and has some pericholecystic inflammatory changes, concerning for acute cholecystitis.  No radiopaque gallstones are confidently identified.  The common bile duct does not appear dilated.  The intrahepatic biliary tree also does not appear to be dilated.  There is a slightly shrunken and nodular contour to the liver, which suggests early morphologic changes of cirrhosis.  Portal vein does appear dilated (19 mm in diameter), suggesting some degree of portal hypertension.  The enhanced appearance of the pancreas, spleen and bilateral adrenal glands is unremarkable.  There are multifocal areas of parenchymal thinning in the kidneys bilaterally (left greater than right), likely represent areas of post infectious/inflammatory scarring.  There is extensive atherosclerosis of the abdominal and pelvic vasculature, without evidence of aneurysm or dissection. There is a small amount of soft tissue stranding which extends down the right side of the retroperitoneum, and a trace amount of reactive fluid in the right pericolic gutter, all of which appear to be related to the inflammatory changes around the gallbladder. The no larger volume of ascites.  No pneumoperitoneum.  No pathologic distension of bowel.  Normal appendix.  Numerous colonic diverticula, including diverticulae in the region of the hepatic flexure of the colon.  The inflammatory changes associated with the gallbladder are intimately associated with the hepatic flexure of the colon, but the inflammatory changes do not appear to have their epicenter in the region of the hepatic flexure of the colon.  The remainder of the colonic diverticula show no evidence of inflammatory changes to suggest acute diverticulitis at this time. Mild thickening of the urinary bladder wall. The prostate  gland appears mildly enlarged.  Musculoskeletal: Clips in the right inguinal region, likely from prior vascular access. There are no aggressive appearing lytic or blastic lesions noted in the visualized portions of the skeleton.  IMPRESSION: 1.  Moderate distension of the gallbladder with  gallbladder wall thickening and pericholecystic inflammatory changes highly suspicious for acute cholecystitis.  No radiopaque gallstones are identified, however, and there are numerous colonic diverticula in the adjacent hepatic flexure of the colon.  Overall, the findings are strongly favored to represent acute cholecystitis, but further evaluation with right upper quadrant ultrasound may be warranted to confirm this diagnosis, as an alternative diagnosis would be an acute hepatic flexure diverticulitis (not strongly favored). 2.  Extensive atherosclerosis, including at least three vessel coronary artery disease. 3.  Thickening of the urinary bladder wall.  Given mild prostate enlargement, this may be related to bladder outlet obstruction. However, correlation for signs and symptoms of urinary tract infection may be warranted. 4.  Findings suggestive of early cirrhosis and mild portal hypertension, as above.   Original Report Authenticated By: Florencia Reasons, M.D.    Dg Chest Port 1 View  12/24/2011  *RADIOLOGY REPORT*  Clinical Data: Well CHF  PORTABLE CHEST - 1 VIEW  Comparison: Prior chest x-ray yesterday, 12/23/2011  Findings: Low lung volumes creates crowding of the pulmonary vasculature.  There may be mild pulmonary vascular congestion bordering on interstitial edema incrementally increased compared to yesterday.  Cardiomegaly is unchanged.  Left subclavian approach biventricular cardiac rhythm maintenance device in unchanged position.  Surgical changes of prior median sternotomy for multivessel CABG.  IMPRESSION:  1.  Mild pulmonary vascular congestion bordering on interstitial edema, incrementally increased compared  to yesterday. 2.  Unchanged cardiomegaly.   Original Report Authenticated By: Alvino Blood Chest Port 1 View  12/23/2011  *RADIOLOGY REPORT*  Clinical Data: Chest pain.  Shortness of breath.  Coronary artery disease.  PORTABLE CHEST - 1 VIEW  Comparison: 08/25/2011  Findings: Low lung volumes again demonstrated, however both lungs are clear.  Mild cardiomegaly stable.  AICD remains in stable position.  Prior CABG again noted.  IMPRESSION: Low lung volumes.  No acute findings.   Original Report Authenticated By: Danae Orleans, M.D.    Dg Shoulder Left  12/23/2011  *RADIOLOGY REPORT*  Clinical Data: Fall.  Shoulder injury and pain.  LEFT SHOULDER - 2+ VIEW  Comparison: None.  Findings: No evidence of fracture or dislocation.  Mild degenerative spurring is seen involving the distal acromion process and inferior glenoid rim.  Pacemaker is also seen in the overlying chest wall soft tissues.  IMPRESSION:  1.  No acute findings. 2.  Mild degenerative spurring of the inferior glenoid and distal acromion.   Original Report Authenticated By: Danae Orleans, M.D.     Microbiology: Recent Results (from the past 240 hour(s))  CLOSTRIDIUM DIFFICILE BY PCR     Status: Normal   Collection Time   12/24/11  8:15 AM      Component Value Range Status Comment   C difficile by pcr NEGATIVE  NEGATIVE Final   STOOL CULTURE     Status: Normal (Preliminary result)   Collection Time   12/24/11  8:15 AM      Component Value Range Status Comment   Specimen Description STOOL   Final    Special Requests NONE   Final    Culture Culture reincubated for better growth   Final    Report Status PENDING   Incomplete      Labs: Basic Metabolic Panel:  Lab 12/26/11 4782 12/25/11 0607 12/24/11 1035 12/24/11 0500 12/23/11 1930  NA 131* 133* -- 131* 131*  K 4.2 4.2 -- 3.3* 3.5  CL 96 97 -- 93* 92*  CO2 23  28 -- 27 25  GLUCOSE 140* 215* -- 192* 174*  BUN 19 24* -- 26* 26*  CREATININE 0.98 1.05 -- 1.13 1.17  CALCIUM 8.7 9.1 -- 8.9  9.0  MG -- -- 1.8 -- --  PHOS -- -- -- -- --   Liver Function Tests:  Lab 12/26/11 0547 12/24/11 0500 12/23/11 1930  AST 21 25 22   ALT 19 16 15   ALKPHOS 119* 101 97  BILITOT 0.7 0.8 1.0  PROT 6.5 6.7 6.9  ALBUMIN 2.5* 2.5* 2.8*    Lab 12/23/11 1930  LIPASE 28  AMYLASE --    Lab 12/23/11 2046  AMMONIA 31   CBC:  Lab 12/26/11 0547 12/25/11 0607 12/24/11 0500 12/23/11 1930  WBC 16.3* 10.1 13.0* 15.2*  NEUTROABS 13.7* -- 10.7* 12.7*  HGB 12.0* 11.7* 12.6* 12.6*  HCT 35.5* 35.1* 36.8* 36.4*  MCV 86.2 87.1 86.2 86.1  PLT 257 225 217 211   Cardiac Enzymes:  Lab 12/25/11 0607 12/24/11 2158 12/24/11 1656 12/24/11 1250 12/24/11 1100 12/24/11 0153  CKTOTAL 95 128 152 -- -- --  CKMB 2.3 2.5 2.5 -- -- --  CKMBINDEX -- -- -- -- -- --  TROPONINI <0.30 <0.30 -- 0.35* <0.30 <0.30   BNP: BNP (last 3 results)  Basename 12/24/11 1100 08/21/11 2118 06/05/11 2021  PROBNP 7842.0* 3265.0* 2501.0*   CBG:  Lab 12/26/11 0734 12/26/11 0618 12/26/11 0501 12/26/11 0010 12/25/11 2021  GLUCAP 163* 147* 178* 208* 197*   Other lab data 1. INR: 1.39. 2. UA: Not suggestive of urinary tract infection.   Time coordinating discharge: Greater than 30 minutes  Discussed with patient and his spouse in detail at the bedside, updated care and answered questions.  Signed:  Antone Summons  [facility] 12/26/2011, 7:58 AM

## 2012-01-02 ENCOUNTER — Encounter (INDEPENDENT_AMBULATORY_CARE_PROVIDER_SITE_OTHER): Payer: Self-pay | Admitting: Surgery

## 2012-01-02 ENCOUNTER — Ambulatory Visit (INDEPENDENT_AMBULATORY_CARE_PROVIDER_SITE_OTHER): Payer: Managed Care, Other (non HMO) | Admitting: Surgery

## 2012-01-02 VITALS — BP 140/84 | HR 88 | Temp 97.2°F | Ht 69.0 in | Wt 215.2 lb

## 2012-01-02 DIAGNOSIS — K8010 Calculus of gallbladder with chronic cholecystitis without obstruction: Secondary | ICD-10-CM

## 2012-01-02 NOTE — Patient Instructions (Signed)

## 2012-01-02 NOTE — Progress Notes (Signed)
Patient ID: [name], male   DOB: 05/16/1940, 71 y.o.   MRN: [id number]  Chief Complaint  Patient presents with  . Pre-op Exam    eval gallbladder    HPI [name] is a 71 y.o. male.   HPIPatient returns to the office after being seen last week by acute care surgeon hospital for acute on chronic cholecystitis. He has multiple cardiac issues and is on Plavix this was held. He is seen today in the office to surgery scheduled this week laparoscopic cholecystectomy and cholangiogram. He denies abdominal pain, nausea, vomiting, chest pain, or shortness of breath. He is eating well. He is on Cipro.  Past Medical History  Diagnosis Date  . Ischemic cardiomyopathy     s/p ICD Implantation by Dr Edmunds  . Chronic systolic dysfunction of left ventricle     EF 30%  . Hyperlipemia   . DJD (degenerative joint disease)   . Gout   . HTN (hypertension) 02/14/2011  . Depression   . Complication of anesthesia     ONCE WITH BACK SURGERY DIFFICULT TO WAKE UP  . Angina   . Myocardial infarction 1997  . Cardiac defibrillator in situ   . DM (diabetes mellitus) 02/14/2011  . Shortness of breath on exertion   . CAD (coronary artery disease)     s/p CABG 1997, PCI (BMS) of SVG to RCA 3/09, redo CABG 02/2011 with SVG to PDA, SVG to Lcx, s/p cath 2.2013 with ocluded SVT to left circ and patent SVG to RCA  . CHF (congestive heart failure)     EF 30% by cath 05/2011  . ICD (implantable cardiac defibrillator) in place   . GERD (gastroesophageal reflux disease)   . Heart attack     Past Surgical History  Procedure Date  . Cardiac defibrillator placement 08/2004    initial placement, upgraded to BIV ICD by Dr Taylor 08/24/11 (MDT)  . Knee arthrotomy ~ 1978    RIGHT KNEE CARTILAGE REMOVED  . Coronary angioplasty with stent placement 06/2007    BMS to SVG to RCA  . Coronary angioplasty 02/2011  . Coronary artery bypass graft 01/1996    CABG x 5 LIMA to LAD SVG to diag1,2,svg to om,svg to RCA  .  Coronary artery bypass graft 03/06/2011    CABG X2; Procedure: REDO CORONARY ARTERY BYPASS GRAFTING (CABG);  Surgeon: Edward B Gerhardt, MD;  Location: [facility] OR;  Service: Open Heart Surgery;  Laterality: N/A;  times two grafts using right saphenous vein harvested endoscopically.  . Cataract extraction w/ intraocular lens  implant, bilateral 2012  . Cardiac defibrillator placement 2009  . Lumbar disc surgery 2003  . Back surgery 2003    Family History  Problem Relation Age of Onset  . Coronary artery disease    . Heart failure Mother   . Heart failure Father   . Heart attack Mother   . Heart attack Father     Social History History  Substance Use Topics  . Smoking status: Former Smoker -- 0.5 packs/day for 15 years    Types: Cigarettes    Quit date: 04/17/1969  . Smokeless tobacco: Never Used  . Alcohol Use: 0.6 oz/week    1 Cans of beer per week     occasional    Allergies  Allergen Reactions  . Codeine Nausea And Vomiting  . Latex Rash    Specifies adhesive     Current Outpatient Prescriptions  Medication Sig Dispense Refill  . allopurinol (  ZYLOPRIM) 100 MG tablet Take 200 mg by mouth daily.       . atorvastatin (LIPITOR) 40 MG tablet Take 40 mg by mouth daily.       . carvedilol (COREG) 25 MG tablet Take 25 mg by mouth 2 (two) times daily.       . ciprofloxacin (CIPRO) 500 MG tablet Take 1 tablet (500 mg total) by mouth 2 (two) times daily.  14 tablet  0  . clopidogrel (PLAVIX) 75 MG tablet       . eplerenone (INSPRA) 25 MG tablet Take 25 mg by mouth daily.       . furosemide (LASIX) 40 MG tablet Take 40 mg by mouth 2 (two) times daily.      . insulin glargine (LANTUS) 100 UNIT/ML injection Inject 35 Units into the skin 2 (two) times daily.       . insulin glulisine (APIDRA) 100 UNIT/ML injection Inject 40 Units into the skin 2 (two) times daily.       . niacin (NIASPAN) 500 MG CR tablet Take 1,000 mg by mouth 2 (two) times daily.       . nitroGLYCERIN (NITROSTAT) 0.4  MG SL tablet Place 0.4 mg under the tongue every 5 (five) minutes x 3 doses as needed. For chest pain.      . ranolazine (RANEXA) 500 MG 12 hr tablet Take 1,000 mg by mouth 2 (two) times daily.      . traMADol (ULTRAM) 50 MG tablet Take 50 mg by mouth every 6 (six) hours as needed. For pain.      . triamcinolone (KENALOG) 0.1 % cream Apply 1 application topically daily as needed. Apply sparingly to affected area externally two times a day as needed for itching and rash      . venlafaxine (EFFEXOR-XR) 150 MG 24 hr capsule Take 150 mg by mouth daily.        . zolpidem (AMBIEN CR) 12.5 MG CR tablet Take 12.5 mg by mouth at bedtime as needed. For sleep        Review of Systems Review of Systems Negative for abdominal pain SOB,CP,  NAUSEA OR VOMITING Blood pressure 140/84, pulse 88, temperature 97.2 F (36.2 C), temperature source Temporal, height 5' 9" (1.753 m), weight 215 lb 3.2 oz (97.614 kg), SpO2 98.00%.  Physical Exam Physical Exam Lungs: CTA CV:RRR Abdomen:  Soft non tender negative Murphy's sign Ext:  No edema Data Clinical Data: Abdominal pain, nausea, vomiting and diarrhea.  Findings on an abdomen CT earlier today highly suspicious for acute  cholecystitis without visible gallstones.  COMPLETE ABDOMINAL ULTRASOUND  Comparison: Abdomen CT obtained earlier today. Renal ultrasound  dated 07/28/2011.  Findings:  Gallbladder: Gallstones and sludge in the gallbladder. The  largest individual stone measures 9 mm in maximum diameter.  Diffuse gallbladder wall thickening with a maximum thickness of  13.1 mm. Small amount of pericholecystic fluid. The patient was  focally tender over the gallbladder.  Common bile duct: Limited visualization. The visualized portion  measures 5.4 mm in diameter proximally.  Liver: Mildly heterogeneous, with corresponding low density on the  recent CT.  IVC: Appears normal.  Pancreas: Limited visualization. The visualized portions appear  normal and  the remainder of the pancreas appear normal on the CT  earlier today.  Spleen: Normal, measuring 7.7 cm in length.  Right Kidney: Mild fetal lobulations. Otherwise, normal,  measuring 12.6 cm in length.  Left Kidney: Mild fetal lobulations. Otherwise, normal, measuring  13.4 cm in   length.  Abdominal aorta: Poorly visualized. The visualized portions are  normal in caliber. The remainder was visualized and normal in  caliber on the CT.  IMPRESSION:  1. Acute cholecystitis with cholelithiasis, sludge in the  gallbladder, diffuse gallbladder wall thickening, mild  pericholecystic fluid and focal tenderness over the gallbladder.  2. Diffuse hepatic steatosis CMP     Component Value Date/Time   NA 131* 12/26/2011 0547   K 4.2 12/26/2011 0547   CL 96 12/26/2011 0547   CO2 23 12/26/2011 0547   GLUCOSE 140* 12/26/2011 0547   BUN 19 12/26/2011 0547   CREATININE 0.98 12/26/2011 0547   CALCIUM 8.7 12/26/2011 0547   PROT 6.5 12/26/2011 0547   ALBUMIN 2.5* 12/26/2011 0547   AST 21 12/26/2011 0547   ALT 19 12/26/2011 0547   ALKPHOS 119* 12/26/2011 0547   BILITOT 0.7 12/26/2011 0547   GFRNONAA 82* 12/26/2011 0547   GFRAA >90 12/26/2011 0547      Assessment    Chronic cholecystitis Patient Active Problem List  Diagnosis  . Ischemic cardiomyopathy  . Hyperlipemia  . CAD (coronary artery disease)  . HTN (hypertension)  . DM (diabetes mellitus)  . Nausea vomiting and diarrhea  . Dizziness  . Preop cardiovascular exam  . Acute cholecystitis  . Chronic cholecystitis with calculus      Plan    The patient's been off Plavix for 5 days. He is asymptomatic. We'll schedule for laparoscopic cholecystectomy with cholangiogram in the next 3 days. He is increased operative risk of bleeding, infection,myocardial infarction, stroke, DVT, and exacerbation of underlying medical problems.The procedure has been discussed with the patient. Operative and non operative treatments have been discussed. Risks of  surgery include bleeding, infection,  Common bile duct injury,  Injury to the stomach,liver, colon,small intestine, abdominal wall,  Diaphragm, death   Major blood vessels,  And the need for an open procedure.  Other risks include worsening of medical problems, death,  DVT and pulmonary embolism, and cardiovascular events.   Medical options have also been discussed. The patient has been informed of long term expectations of surgery and non surgical options,  The patient agrees to proceed.         Mignon Bechler A. 01/02/2012, 10:03 AM    

## 2012-01-03 ENCOUNTER — Encounter (HOSPITAL_COMMUNITY): Payer: Self-pay | Admitting: Pharmacy Technician

## 2012-01-04 ENCOUNTER — Encounter (HOSPITAL_COMMUNITY)
Admission: RE | Admit: 2012-01-04 | Discharge: 2012-01-04 | Disposition: A | Discharge status: Home / Self Care | Payer: Managed Care, Other (non HMO) | Source: Ambulatory Visit | Attending: Surgery | Admitting: Surgery

## 2012-01-04 ENCOUNTER — Encounter (HOSPITAL_COMMUNITY): Payer: Self-pay

## 2012-01-04 DIAGNOSIS — E782 Mixed hyperlipidemia: Secondary | ICD-10-CM | POA: Diagnosis not present

## 2012-01-04 DIAGNOSIS — I10 Essential (primary) hypertension: Secondary | ICD-10-CM | POA: Diagnosis not present

## 2012-01-04 DIAGNOSIS — I5022 Chronic systolic (congestive) heart failure: Secondary | ICD-10-CM | POA: Diagnosis not present

## 2012-01-04 DIAGNOSIS — I2510 Atherosclerotic heart disease of native coronary artery without angina pectoris: Secondary | ICD-10-CM | POA: Diagnosis not present

## 2012-01-04 LAB — SURGICAL PCR SCREEN: MRSA, PCR: NEGATIVE

## 2012-01-04 LAB — BASIC METABOLIC PANEL
BUN: 20 mg/dL (ref 6–23)
Chloride: 96 mEq/L (ref 96–112)
GFR calc Af Amer: 68 mL/min — ABNORMAL LOW (ref >90)
Potassium: 4.2 mEq/L (ref 3.5–5.1)

## 2012-01-04 LAB — CBC
HCT: 41.4 % (ref 39.0–52.0)
Hemoglobin: 13.8 g/dL (ref 13.0–17.0)
MCHC: 33.3 g/dL (ref 30.0–36.0)

## 2012-01-04 MED ORDER — DEXTROSE 5 % IV SOLN
3.0000 g | INTRAVENOUS | Status: AC
  Administered 2012-01-05: 3 g via INTRAVENOUS
  Filled 2012-01-04 (×2): qty 3000

## 2012-01-04 NOTE — Pre-Procedure Instructions (Signed)
20 [name]  01/04/2012   Your procedure is scheduled on:  01-05-2012  [facility] at 8:00 AM 01-05-2012  As to arrival time   Call this number if you have problems the morning of surgery: [phone]   Remember:   Do not eat food or drink:After Midnight   Thursday.      Take these medicines the morning of surgery with A SIP OF WATER:              allopurinol,carvedilol(Coreg),Inspra,Ranexa, effexor XR   Do not wear jewelry,  Do not wear lotions, powders, or perfumes. You may wear deodorant.  Do not shave 48 hours prior to surgery. Men may shave face and neck.  Do not bring valuables to the hospital.  Contacts, dentures or bridgework may not be worn into surgery.  Leave suitcase in the car. After surgery it may be brought to your room.  For patients admitted to the hospital, checkout time is 11:00 AM the day of discharge.   Patients discharged the day of surgery will not be allowed to drive home.  Name and phone number of your driver: _____________________    Special Instructions: CHG Shower Shower 2 days before surgery and 1 day before surgery with Hibiclens.     Please read over the following fact sheets that you were given: Pain Booklet, MRSA Information and Surgical Site Infection Prevention

## 2012-01-04 NOTE — Progress Notes (Signed)
[employer] Rep. Never returned page. Will need to call in AM .

## 2012-01-04 NOTE — Progress Notes (Signed)
Perioperative device orders faxed to [facility] device clinic.  Requested OV and cardiac testing from [facility].

## 2012-01-05 ENCOUNTER — Ambulatory Visit (HOSPITAL_COMMUNITY)
Admission: RE | Admit: 2012-01-05 | Discharge: 2012-01-07 | Disposition: A | Discharge status: Home / Self Care | Payer: Managed Care, Other (non HMO) | Source: Ambulatory Visit | Attending: General Surgery | Admitting: General Surgery

## 2012-01-05 ENCOUNTER — Ambulatory Visit (HOSPITAL_COMMUNITY): Payer: Managed Care, Other (non HMO) | Admitting: Critical Care Medicine

## 2012-01-05 ENCOUNTER — Encounter (HOSPITAL_COMMUNITY)
Admission: RE | Disposition: A | Discharge status: Home / Self Care | Payer: Self-pay | Source: Ambulatory Visit | Attending: Surgery

## 2012-01-05 ENCOUNTER — Encounter (HOSPITAL_COMMUNITY): Payer: Self-pay | Admitting: Critical Care Medicine

## 2012-01-05 ENCOUNTER — Encounter (HOSPITAL_COMMUNITY): Payer: Self-pay | Admitting: Surgery

## 2012-01-05 ENCOUNTER — Ambulatory Visit (HOSPITAL_COMMUNITY): Payer: Managed Care, Other (non HMO)

## 2012-01-05 DIAGNOSIS — K812 Acute cholecystitis with chronic cholecystitis: Secondary | ICD-10-CM | POA: Insufficient documentation

## 2012-01-05 DIAGNOSIS — K8010 Calculus of gallbladder with chronic cholecystitis without obstruction: Secondary | ICD-10-CM

## 2012-01-05 DIAGNOSIS — K8020 Calculus of gallbladder without cholecystitis without obstruction: Secondary | ICD-10-CM | POA: Diagnosis not present

## 2012-01-05 DIAGNOSIS — E119 Type 2 diabetes mellitus without complications: Secondary | ICD-10-CM | POA: Insufficient documentation

## 2012-01-05 DIAGNOSIS — K8066 Calculus of gallbladder and bile duct with acute and chronic cholecystitis without obstruction: Secondary | ICD-10-CM

## 2012-01-05 DIAGNOSIS — I10 Essential (primary) hypertension: Secondary | ICD-10-CM | POA: Insufficient documentation

## 2012-01-05 DIAGNOSIS — Z01812 Encounter for preprocedural laboratory examination: Secondary | ICD-10-CM | POA: Insufficient documentation

## 2012-01-05 HISTORY — PX: CHOLECYSTECTOMY: SHX55

## 2012-01-05 LAB — CBC
HCT: 40.1 % (ref 39.0–52.0)
Hemoglobin: 13.4 g/dL (ref 13.0–17.0)
MCH: 29.8 pg (ref 26.0–34.0)
MCHC: 33.4 g/dL (ref 30.0–36.0)
MCV: 89.1 fL (ref 78.0–100.0)
RBC: 4.5 MIL/uL (ref 4.22–5.81)

## 2012-01-05 LAB — CREATININE, SERUM: Creatinine, Ser: 0.96 mg/dL (ref 0.50–1.35)

## 2012-01-05 SURGERY — LAPAROSCOPIC CHOLECYSTECTOMY WITH INTRAOPERATIVE CHOLANGIOGRAM
Anesthesia: General | Site: Abdomen | Wound class: Contaminated

## 2012-01-05 MED ORDER — ENOXAPARIN SODIUM 40 MG/0.4ML ~~LOC~~ SOLN
40.0000 mg | SUBCUTANEOUS | Status: DC
  Administered 2012-01-06 – 2012-01-07 (×2): 40 mg via SUBCUTANEOUS
  Filled 2012-01-05 (×3): qty 0.4

## 2012-01-05 MED ORDER — FENTANYL CITRATE 0.05 MG/ML IJ SOLN
50.0000 ug | INTRAMUSCULAR | Status: DC | PRN
  Administered 2012-01-06 (×3): 50 ug via INTRAVENOUS
  Filled 2012-01-05 (×4): qty 2

## 2012-01-05 MED ORDER — CARVEDILOL 25 MG PO TABS
25.0000 mg | ORAL_TABLET | Freq: Two times a day (BID) | ORAL | Status: DC
  Administered 2012-01-07: 25 mg via ORAL
  Filled 2012-01-05 (×4): qty 1

## 2012-01-05 MED ORDER — BUPIVACAINE-EPINEPHRINE PF 0.25-1:200000 % IJ SOLN
INTRAMUSCULAR | Status: AC
  Filled 2012-01-05: qty 30

## 2012-01-05 MED ORDER — FUROSEMIDE 40 MG PO TABS
40.0000 mg | ORAL_TABLET | Freq: Two times a day (BID) | ORAL | Status: DC
  Administered 2012-01-05 – 2012-01-07 (×4): 40 mg via ORAL
  Filled 2012-01-05 (×7): qty 1

## 2012-01-05 MED ORDER — VENLAFAXINE HCL ER 150 MG PO CP24
150.0000 mg | ORAL_CAPSULE | Freq: Every day | ORAL | Status: DC
  Administered 2012-01-06 – 2012-01-07 (×2): 150 mg via ORAL
  Filled 2012-01-05 (×2): qty 1

## 2012-01-05 MED ORDER — PHENYLEPHRINE HCL 10 MG/ML IJ SOLN
INTRAMUSCULAR | Status: DC | PRN
  Administered 2012-01-05: 80 ug via INTRAVENOUS

## 2012-01-05 MED ORDER — LIDOCAINE HCL (CARDIAC) 20 MG/ML IV SOLN
INTRAVENOUS | Status: DC | PRN
  Administered 2012-01-05: 50 mg via INTRAVENOUS

## 2012-01-05 MED ORDER — RANOLAZINE ER 500 MG PO TB12
1000.0000 mg | ORAL_TABLET | Freq: Two times a day (BID) | ORAL | Status: DC
  Administered 2012-01-05 – 2012-01-07 (×4): 1000 mg via ORAL
  Filled 2012-01-05 (×5): qty 2

## 2012-01-05 MED ORDER — ONDANSETRON HCL 4 MG/2ML IJ SOLN: 4.0000 mg | Freq: Once | INTRAMUSCULAR | Status: DC | PRN

## 2012-01-05 MED ORDER — NEOSTIGMINE METHYLSULFATE 1 MG/ML IJ SOLN
INTRAMUSCULAR | Status: DC | PRN
  Administered 2012-01-05: 4 mg via INTRAVENOUS

## 2012-01-05 MED ORDER — NITROGLYCERIN 0.4 MG SL SUBL: 0.4000 mg | SUBLINGUAL_TABLET | SUBLINGUAL | Status: DC | PRN

## 2012-01-05 MED ORDER — OXYCODONE HCL 5 MG/5ML PO SOLN
5.0000 mg | Freq: Once | ORAL | Status: DC | PRN
Start: 2012-01-05 — End: 2012-01-05

## 2012-01-05 MED ORDER — ZOLPIDEM TARTRATE 5 MG PO TABS
5.0000 mg | ORAL_TABLET | Freq: Every evening | ORAL | Status: DC | PRN
  Administered 2012-01-06: 5 mg via ORAL
  Filled 2012-01-05: qty 1

## 2012-01-05 MED ORDER — ETOMIDATE 2 MG/ML IV SOLN
INTRAVENOUS | Status: DC | PRN
  Administered 2012-01-05: 20 mg via INTRAVENOUS

## 2012-01-05 MED ORDER — HYDROMORPHONE HCL PF 1 MG/ML IJ SOLN
INTRAMUSCULAR | Status: AC
  Filled 2012-01-05: qty 1

## 2012-01-05 MED ORDER — SPIRONOLACTONE 25 MG PO TABS
25.0000 mg | ORAL_TABLET | Freq: Every day | ORAL | Status: DC
  Administered 2012-01-06 – 2012-01-07 (×2): 25 mg via ORAL
  Filled 2012-01-05 (×2): qty 1

## 2012-01-05 MED ORDER — MIDAZOLAM HCL 5 MG/5ML IJ SOLN
INTRAMUSCULAR | Status: DC | PRN
  Administered 2012-01-05: 2 mg via INTRAVENOUS

## 2012-01-05 MED ORDER — LACTATED RINGERS IV SOLN
INTRAVENOUS | Status: DC | PRN
  Administered 2012-01-05: 16:00:00 via INTRAVENOUS

## 2012-01-05 MED ORDER — ONDANSETRON HCL 4 MG/2ML IJ SOLN
4.0000 mg | Freq: Four times a day (QID) | INTRAMUSCULAR | Status: DC | PRN
  Administered 2012-01-06: 4 mg via INTRAVENOUS
  Filled 2012-01-05: qty 2

## 2012-01-05 MED ORDER — SODIUM CHLORIDE 0.9 % IR SOLN
Status: DC | PRN
  Administered 2012-01-05: 1000 mL

## 2012-01-05 MED ORDER — ROCURONIUM BROMIDE 100 MG/10ML IV SOLN
INTRAVENOUS | Status: DC | PRN
  Administered 2012-01-05: 50 mg via INTRAVENOUS

## 2012-01-05 MED ORDER — SODIUM CHLORIDE 0.9 % IV SOLN
INTRAVENOUS | Status: DC | PRN
  Administered 2012-01-05: 18:00:00

## 2012-01-05 MED ORDER — FENTANYL CITRATE 0.05 MG/ML IJ SOLN
INTRAMUSCULAR | Status: DC | PRN
  Administered 2012-01-05: 100 ug via INTRAVENOUS
  Administered 2012-01-05: 50 ug via INTRAVENOUS

## 2012-01-05 MED ORDER — ONDANSETRON HCL 4 MG PO TABS: 4.0000 mg | ORAL_TABLET | Freq: Four times a day (QID) | ORAL | Status: DC | PRN

## 2012-01-05 MED ORDER — OXYCODONE HCL 5 MG PO TABS: 5.0000 mg | ORAL_TABLET | Freq: Once | ORAL | Status: DC | PRN

## 2012-01-05 MED ORDER — ONDANSETRON HCL 4 MG/2ML IJ SOLN
INTRAMUSCULAR | Status: DC | PRN
  Administered 2012-01-05: 4 mg via INTRAVENOUS

## 2012-01-05 MED ORDER — ALLOPURINOL 100 MG PO TABS
100.0000 mg | ORAL_TABLET | Freq: Two times a day (BID) | ORAL | Status: DC
  Administered 2012-01-05 – 2012-01-07 (×4): 100 mg via ORAL
  Filled 2012-01-05 (×5): qty 1

## 2012-01-05 MED ORDER — BUPIVACAINE-EPINEPHRINE 0.25% -1:200000 IJ SOLN
INTRAMUSCULAR | Status: DC | PRN
  Administered 2012-01-05: 19 mL

## 2012-01-05 MED ORDER — NIACIN ER (ANTIHYPERLIPIDEMIC) 500 MG PO TBCR
1000.0000 mg | EXTENDED_RELEASE_TABLET | Freq: Two times a day (BID) | ORAL | Status: DC
  Administered 2012-01-05 – 2012-01-07 (×4): 1000 mg via ORAL
  Filled 2012-01-05 (×6): qty 2

## 2012-01-05 MED ORDER — INSULIN ASPART 100 UNIT/ML ~~LOC~~ SOLN
0.0000 [IU] | SUBCUTANEOUS | Status: DC
  Administered 2012-01-05 – 2012-01-06 (×2): 3 [IU] via SUBCUTANEOUS
  Administered 2012-01-06: 2 [IU] via SUBCUTANEOUS
  Administered 2012-01-06: 5 [IU] via SUBCUTANEOUS
  Administered 2012-01-06: 2 [IU] via SUBCUTANEOUS

## 2012-01-05 MED ORDER — HYDROMORPHONE HCL PF 1 MG/ML IJ SOLN
0.2500 mg | INTRAMUSCULAR | Status: DC | PRN
  Administered 2012-01-05 (×4): 0.5 mg via INTRAVENOUS

## 2012-01-05 MED ORDER — LACTATED RINGERS IV SOLN
INTRAVENOUS | Status: DC
  Administered 2012-01-05 – 2012-01-06 (×3): via INTRAVENOUS

## 2012-01-05 MED ORDER — OXYCODONE-ACETAMINOPHEN 5-325 MG PO TABS
1.0000 | ORAL_TABLET | ORAL | Status: DC | PRN
  Administered 2012-01-05 – 2012-01-06 (×3): 2 via ORAL
  Filled 2012-01-05 (×4): qty 2

## 2012-01-05 MED ORDER — ATORVASTATIN CALCIUM 40 MG PO TABS
40.0000 mg | ORAL_TABLET | Freq: Every day | ORAL | Status: DC
  Administered 2012-01-05 – 2012-01-07 (×3): 40 mg via ORAL
  Filled 2012-01-05 (×3): qty 1

## 2012-01-05 MED ORDER — GLYCOPYRROLATE 0.2 MG/ML IJ SOLN
INTRAMUSCULAR | Status: DC | PRN
  Administered 2012-01-05: .5 mg via INTRAVENOUS

## 2012-01-05 MED ORDER — DEXTROSE 5 % IV SOLN
INTRAVENOUS | Status: DC | PRN
  Administered 2012-01-05: 17:00:00 via INTRAVENOUS

## 2012-01-05 SURGICAL SUPPLY — 45 items
ADH SKN CLS APL DERMABOND .7 (GAUZE/BANDAGES/DRESSINGS) ×1
ADH SKN CLS LQ APL DERMABOND (GAUZE/BANDAGES/DRESSINGS) ×1
APPLIER CLIP ROT 10 11.4 M/L (STAPLE) ×2
APR CLP MED LRG 11.4X10 (STAPLE) ×1
BAG SPEC RTRVL LRG 6X4 10 (ENDOMECHANICALS) ×1
BLADE SURG ROTATE 9660 (MISCELLANEOUS) IMPLANT
CANISTER SUCTION 2500CC (MISCELLANEOUS) ×2 IMPLANT
CHLORAPREP W/TINT 26ML (MISCELLANEOUS) ×2 IMPLANT
CLIP APPLIE ROT 10 11.4 M/L (STAPLE) ×1 IMPLANT
CLOTH BEACON ORANGE TIMEOUT ST (SAFETY) ×2 IMPLANT
COVER MAYO STAND STRL (DRAPES) ×2 IMPLANT
COVER SURGICAL LIGHT HANDLE (MISCELLANEOUS) ×2 IMPLANT
DECANTER SPIKE VIAL GLASS SM (MISCELLANEOUS) ×4 IMPLANT
DERMABOND ADHESIVE PROPEN (GAUZE/BANDAGES/DRESSINGS) ×1
DERMABOND ADVANCED (GAUZE/BANDAGES/DRESSINGS) ×1
DERMABOND ADVANCED .7 DNX12 (GAUZE/BANDAGES/DRESSINGS) ×1 IMPLANT
DERMABOND ADVANCED .7 DNX6 (GAUZE/BANDAGES/DRESSINGS) IMPLANT
DRAPE C-ARM 42X72 X-RAY (DRAPES) ×2 IMPLANT
DRAPE UTILITY 15X26 W/TAPE STR (DRAPE) ×4 IMPLANT
DRAPE WARM FLUID 44X44 (DRAPE) ×2 IMPLANT
ELECT REM PT RETURN 9FT ADLT (ELECTROSURGICAL) ×2
ELECTRODE REM PT RTRN 9FT ADLT (ELECTROSURGICAL) ×1 IMPLANT
GLOVE BIO SURGEON STRL SZ8 (GLOVE) ×2 IMPLANT
GLOVE BIOGEL PI IND STRL 8 (GLOVE) ×1 IMPLANT
GLOVE BIOGEL PI INDICATOR 8 (GLOVE) ×1
GOWN STRL NON-REIN LRG LVL3 (GOWN DISPOSABLE) ×8 IMPLANT
IV NS 1000ML (IV SOLUTION) ×4
IV NS 1000ML BAXH (IV SOLUTION) IMPLANT
KIT BASIN OR (CUSTOM PROCEDURE TRAY) ×2 IMPLANT
KIT ROOM TURNOVER OR (KITS) ×2 IMPLANT
NS IRRIG 1000ML POUR BTL (IV SOLUTION) ×6 IMPLANT
PAD ARMBOARD 7.5X6 YLW CONV (MISCELLANEOUS) ×2 IMPLANT
POUCH SPECIMEN RETRIEVAL 10MM (ENDOMECHANICALS) ×2 IMPLANT
SCISSORS LAP 5X35 DISP (ENDOMECHANICALS) IMPLANT
SET CHOLANGIOGRAPH 5 50 .035 (SET/KITS/TRAYS/PACK) ×2 IMPLANT
SET IRRIG TUBING LAPAROSCOPIC (IRRIGATION / IRRIGATOR) ×2 IMPLANT
SLEEVE ENDOPATH XCEL 5M (ENDOMECHANICALS) ×2 IMPLANT
SPECIMEN JAR SMALL (MISCELLANEOUS) ×2 IMPLANT
SUT MNCRL AB 4-0 PS2 18 (SUTURE) ×2 IMPLANT
TOWEL OR 17X24 6PK STRL BLUE (TOWEL DISPOSABLE) ×2 IMPLANT
TOWEL OR 17X26 10 PK STRL BLUE (TOWEL DISPOSABLE) ×2 IMPLANT
TRAY LAPAROSCOPIC (CUSTOM PROCEDURE TRAY) ×2 IMPLANT
TROCAR XCEL BLUNT TIP 100MML (ENDOMECHANICALS) ×2 IMPLANT
TROCAR XCEL NON-BLD 11X100MML (ENDOMECHANICALS) ×2 IMPLANT
TROCAR XCEL NON-BLD 5MMX100MML (ENDOMECHANICALS) ×2 IMPLANT

## 2012-01-05 NOTE — Preoperative (Signed)
Beta Blockers   Reason not to administer Beta Blockers:Not Applicable 

## 2012-01-05 NOTE — Anesthesia Procedure Notes (Signed)
Procedure Name: Intubation Date/Time: 01/05/2012 4:31 PM Performed by: Nayleah Gamel S Pre-anesthesia Checklist: Patient identified, Timeout performed, Emergency Drugs available, Suction available and Patient being monitored Patient Re-evaluated:Patient Re-evaluated prior to inductionOxygen Delivery Method: Circle system utilized Preoxygenation: Pre-oxygenation with 100% oxygen Intubation Type: IV induction Ventilation: Mask ventilation without difficulty Laryngoscope Size: Mac and 4 Grade View: Grade I Tube type: Oral Tube size: 7.5 mm Number of attempts: 1 Airway Equipment and Method: Stylet Placement Confirmation: ETT inserted through vocal cords under direct vision,  positive ETCO2 and breath sounds checked- equal and bilateral Secured at: 22 cm Tube secured with: Tape Dental Injury: Teeth and Oropharynx as per pre-operative assessment

## 2012-01-05 NOTE — Anesthesia Preprocedure Evaluation (Addendum)
Anesthesia Evaluation  Patient identified by MRN, date of birth, ID band Patient awake    Reviewed: Allergy & Precautions, H&P , NPO status , Patient's Chart, lab work & pertinent test results, reviewed documented beta blocker date and time   History of Anesthesia Complications Negative for: history of anesthetic complications  Airway Mallampati: II  Neck ROM: Full    Dental  (+) Edentulous Upper and Edentulous Lower   Pulmonary former smoker,          Cardiovascular hypertension, Pt. on medications and Pt. on home beta blockers + angina + CAD, + Past MI and +CHF + Cardiac Defibrillator     Neuro/Psych Depression negative neurological ROS     GI/Hepatic GERD-  ,  Endo/Other  diabetes, Type 2  Renal/GU      Musculoskeletal negative musculoskeletal ROS (+)   Abdominal   Peds  Hematology negative hematology ROS (+)   Anesthesia Other Findings   Reproductive/Obstetrics                          Anesthesia Physical Anesthesia Plan  ASA: III  Anesthesia Plan: General   Post-op Pain Management:    Induction: Intravenous  Airway Management Planned: Oral ETT  Additional Equipment:   Intra-op Plan:   Post-operative Plan: Extubation in OR  Informed Consent: I have reviewed the patients History and Physical, chart, labs and discussed the procedure including the risks, benefits and alternatives for the proposed anesthesia with the patient or authorized representative who has indicated his/her understanding and acceptance.   Dental advisory given  Plan Discussed with: CRNA  Anesthesia Plan Comments:         Anesthesia Quick Evaluation

## 2012-01-05 NOTE — Interval H&P Note (Signed)
History and Physical Interval Note:  01/05/2012 3:49 PM  [name]  has presented today for surgery, with the diagnosis of cholecystitis  The various methods of treatment have been discussed with the patient and family. After consideration of risks, benefits and other options for treatment, the patient has consented to  Procedure(s) (LRB) with comments: LAPAROSCOPIC CHOLECYSTECTOMY WITH INTRAOPERATIVE CHOLANGIOGRAM (N/A) - laparoscopic cholecysectoym with intraoperative cholangiogram as a surgical intervention .  The patient's history has been reviewed, patient examined, no change in status, stable for surgery.  I have reviewed the patient's chart and labs.  Questions were answered to the patient's satisfaction.     [name] A.

## 2012-01-05 NOTE — H&P (View-Only) (Signed)
Patient ID: [name], male   DOB: 01/13/1941, 71 y.o.   MRN: [id number]  Chief Complaint  Patient presents with  . Pre-op Exam    eval gallbladder    HPI [name] is a 71 y.o. male.   HPIPatient returns to the office after being seen last week by acute care surgeon hospital for acute on chronic cholecystitis. He has multiple cardiac issues and is on Plavix this was held. He is seen today in the office to surgery scheduled this week laparoscopic cholecystectomy and cholangiogram. He denies abdominal pain, nausea, vomiting, chest pain, or shortness of breath. He is eating well. He is on Cipro.  Past Medical History  Diagnosis Date  . Ischemic cardiomyopathy     s/p ICD Implantation by Dr Amil Amen  . Chronic systolic dysfunction of left ventricle     EF 30%  . Hyperlipemia   . DJD (degenerative joint disease)   . Gout   . HTN (hypertension) 02/14/2011  . Depression   . Complication of anesthesia     ONCE WITH BACK SURGERY DIFFICULT TO WAKE UP  . Angina   . Myocardial infarction 1997  . Cardiac defibrillator in situ   . DM (diabetes mellitus) 02/14/2011  . Shortness of breath on exertion   . CAD (coronary artery disease)     s/p CABG 1997, PCI (BMS) of SVG to RCA 3/09, redo CABG 02/2011 with SVG to PDA, SVG to Lcx, s/p cath 2.2013 with ocluded SVT to left circ and patent SVG to RCA  . CHF (congestive heart failure)     EF 30% by cath 05/2011  . ICD (implantable cardiac defibrillator) in place   . GERD (gastroesophageal reflux disease)   . Heart attack     Past Surgical History  Procedure Date  . Cardiac defibrillator placement 08/2004    initial placement, upgraded to BIV ICD by Dr Ladona Ridgel 08/24/11 (MDT)  . Knee arthrotomy ~ 1978    RIGHT KNEE CARTILAGE REMOVED  . Coronary angioplasty with stent placement 06/2007    BMS to SVG to RCA  . Coronary angioplasty 02/2011  . Coronary artery bypass graft 01/1996    CABG x 5 LIMA to LAD SVG to diag1,2,svg to om,svg to RCA  .  Coronary artery bypass graft 03/06/2011    CABG X2; Procedure: REDO CORONARY ARTERY BYPASS GRAFTING (CABG);  Surgeon: Delight Ovens, MD;  Location: [facility] OR;  Service: Open Heart Surgery;  Laterality: N/A;  times two grafts using right saphenous vein harvested endoscopically.  . Cataract extraction w/ intraocular lens  implant, bilateral 2012  . Cardiac defibrillator placement 2009  . Lumbar disc surgery 2003  . Back surgery 2003    Family History  Problem Relation Age of Onset  . Coronary artery disease    . Heart failure Mother   . Heart failure Father   . Heart attack Mother   . Heart attack Father     Social History History  Substance Use Topics  . Smoking status: Former Smoker -- 0.5 packs/day for 15 years    Types: Cigarettes    Quit date: 04/17/1969  . Smokeless tobacco: Never Used  . Alcohol Use: 0.6 oz/week    1 Cans of beer per week     occasional    Allergies  Allergen Reactions  . Codeine Nausea And Vomiting  . Latex Rash    Specifies adhesive     Current Outpatient Prescriptions  Medication Sig Dispense Refill  . allopurinol (  ZYLOPRIM) 100 MG tablet Take 200 mg by mouth daily.       Marland Kitchen atorvastatin (LIPITOR) 40 MG tablet Take 40 mg by mouth daily.       . carvedilol (COREG) 25 MG tablet Take 25 mg by mouth 2 (two) times daily.       . ciprofloxacin (CIPRO) 500 MG tablet Take 1 tablet (500 mg total) by mouth 2 (two) times daily.  14 tablet  0  . clopidogrel (PLAVIX) 75 MG tablet       . eplerenone (INSPRA) 25 MG tablet Take 25 mg by mouth daily.       . furosemide (LASIX) 40 MG tablet Take 40 mg by mouth 2 (two) times daily.      . insulin glargine (LANTUS) 100 UNIT/ML injection Inject 35 Units into the skin 2 (two) times daily.       . insulin glulisine (APIDRA) 100 UNIT/ML injection Inject 40 Units into the skin 2 (two) times daily.       . niacin (NIASPAN) 500 MG CR tablet Take 1,000 mg by mouth 2 (two) times daily.       . nitroGLYCERIN (NITROSTAT) 0.4  MG SL tablet Place 0.4 mg under the tongue every 5 (five) minutes x 3 doses as needed. For chest pain.      . ranolazine (RANEXA) 500 MG 12 hr tablet Take 1,000 mg by mouth 2 (two) times daily.      . traMADol (ULTRAM) 50 MG tablet Take 50 mg by mouth every 6 (six) hours as needed. For pain.      Marland Kitchen triamcinolone (KENALOG) 0.1 % cream Apply 1 application topically daily as needed. Apply sparingly to affected area externally two times a day as needed for itching and rash      . venlafaxine (EFFEXOR-XR) 150 MG 24 hr capsule Take 150 mg by mouth daily.        Marland Kitchen zolpidem (AMBIEN CR) 12.5 MG CR tablet Take 12.5 mg by mouth at bedtime as needed. For sleep        Review of Systems Review of Systems Negative for abdominal pain SOB,CP,  NAUSEA OR VOMITING Blood pressure 140/84, pulse 88, temperature 97.2 F (36.2 C), temperature source Temporal, height 5\' 9"  (1.753 m), weight 215 lb 3.2 oz (97.614 kg), SpO2 98.00%.  Physical Exam Physical Exam Lungs: CTA CV:RRR Abdomen:  Soft non tender negative Murphy's sign Ext:  No edema Data Clinical Data: Abdominal pain, nausea, vomiting and diarrhea.  Findings on an abdomen CT earlier today highly suspicious for acute  cholecystitis without visible gallstones.  COMPLETE ABDOMINAL ULTRASOUND  Comparison: Abdomen CT obtained earlier today. Renal ultrasound  dated 07/28/2011.  Findings:  Gallbladder: Gallstones and sludge in the gallbladder. The  largest individual stone measures 9 mm in maximum diameter.  Diffuse gallbladder wall thickening with a maximum thickness of  13.1 mm. Small amount of pericholecystic fluid. The patient was  focally tender over the gallbladder.  Common bile duct: Limited visualization. The visualized portion  measures 5.4 mm in diameter proximally.  Liver: Mildly heterogeneous, with corresponding low density on the  recent CT.  IVC: Appears normal.  Pancreas: Limited visualization. The visualized portions appear  normal and  the remainder of the pancreas appear normal on the CT  earlier today.  Spleen: Normal, measuring 7.7 cm in length.  Right Kidney: Mild fetal lobulations. Otherwise, normal,  measuring 12.6 cm in length.  Left Kidney: Mild fetal lobulations. Otherwise, normal, measuring  13.4 cm in  length.  Abdominal aorta: Poorly visualized. The visualized portions are  normal in caliber. The remainder was visualized and normal in  caliber on the CT.  IMPRESSION:  1. Acute cholecystitis with cholelithiasis, sludge in the  gallbladder, diffuse gallbladder wall thickening, mild  pericholecystic fluid and focal tenderness over the gallbladder.  2. Diffuse hepatic steatosis CMP     Component Value Date/Time   NA 131* 12/26/2011 0547   K 4.2 12/26/2011 0547   CL 96 12/26/2011 0547   CO2 23 12/26/2011 0547   GLUCOSE 140* 12/26/2011 0547   BUN 19 12/26/2011 0547   CREATININE 0.98 12/26/2011 0547   CALCIUM 8.7 12/26/2011 0547   PROT 6.5 12/26/2011 0547   ALBUMIN 2.5* 12/26/2011 0547   AST 21 12/26/2011 0547   ALT 19 12/26/2011 0547   ALKPHOS 119* 12/26/2011 0547   BILITOT 0.7 12/26/2011 0547   GFRNONAA 82* 12/26/2011 0547   GFRAA >90 12/26/2011 0547      Assessment    Chronic cholecystitis Patient Active Problem List  Diagnosis  . Ischemic cardiomyopathy  . Hyperlipemia  . CAD (coronary artery disease)  . HTN (hypertension)  . DM (diabetes mellitus)  . Nausea vomiting and diarrhea  . Dizziness  . Preop cardiovascular exam  . Acute cholecystitis  . Chronic cholecystitis with calculus      Plan    The patient's been off Plavix for 5 days. He is asymptomatic. We'll schedule for laparoscopic cholecystectomy with cholangiogram in the next 3 days. He is increased operative risk of bleeding, infection,myocardial infarction, stroke, DVT, and exacerbation of underlying medical problems.The procedure has been discussed with the patient. Operative and non operative treatments have been discussed. Risks of  surgery include bleeding, infection,  Common bile duct injury,  Injury to the stomach,liver, colon,small intestine, abdominal wall,  Diaphragm, death   Major blood vessels,  And the need for an open procedure.  Other risks include worsening of medical problems, death,  DVT and pulmonary embolism, and cardiovascular events.   Medical options have also been discussed. The patient has been informed of long term expectations of surgery and non surgical options,  The patient agrees to proceed.         Aliany Fiorenza A. 01/02/2012, 10:03 AM

## 2012-01-05 NOTE — Progress Notes (Signed)
FAXED ICD ORDER TO [facility] .  SPOKE WITH MARSENA , [employer] REP RE; SURGERY 415 PM TODAY, SHE ASKED OR HOLDING CALL HER WHEN PT GOES TO SURGERY.

## 2012-01-05 NOTE — Transfer of Care (Signed)
Immediate Anesthesia Transfer of Care Note  Patient: [name]  Procedure(s) Performed: Procedure(s) (LRB) with comments: LAPAROSCOPIC CHOLECYSTECTOMY WITH INTRAOPERATIVE CHOLANGIOGRAM (N/A) - laparoscopic cholecysectoym with intraoperative cholangiogram  Patient Location: PACU  Anesthesia Type: General  Level of Consciousness: awake, alert  and oriented  Airway & Oxygen Therapy: Patient Spontanous Breathing and Patient connected to nasal cannula oxygen  Post-op Assessment: Report given to PACU RN and Post -op Vital signs reviewed and stable  Post vital signs: Reviewed and stable  Complications: No apparent anesthesia complications

## 2012-01-05 NOTE — Op Note (Signed)
Laparoscopic Cholecystectomy with IOC Procedure Note  Indications: This patient presents with symptomatic gallbladder disease and will undergo laparoscopic cholecystectomy.  Pre-operative Diagnosis: Acute and chronic cholecystitis  Post-operative Diagnosis: Same  Surgeon: Ryiah Bellissimo A.   Assistants: Dr Michaell Cowing  Anesthesia: General endotracheal anesthesia and Local anesthesia 0.25.% bupivacaine, with epinephrine  ASA Class: 3  Procedure Details  The patient was seen again in the Holding Room. The risks, benefits, complications, treatment options, and expected outcomes were discussed with the patient. The possibilities of reaction to medication, pulmonary aspiration, perforation of viscus, bleeding, recurrent infection, finding a normal gallbladder, the need for additional procedures, failure to diagnose a condition, the possible need to convert to an open procedure, and creating a complication requiring transfusion or operation were discussed with the patient. The patient and/or family concurred with the proposed plan, giving informed consent. The site of surgery properly noted/marked. The patient was taken to Operating Room, identified as [name] and the procedure verified as Laparoscopic Cholecystectomy with Intraoperative Cholangiograms. A Time Out was held and the above information confirmed.  Prior to the induction of general anesthesia, antibiotic prophylaxis was administered. General endotracheal anesthesia was then administered and tolerated well. After the induction, the abdomen was prepped in the usual sterile fashion. The patient was positioned in the supine position with the left arm comfortably tucked, along with some reverse Trendelenburg.  Local anesthetic agent was injected into the skin near the umbilicus and an incision made. The midline fascia was incised and the Hasson technique was used to introduce a 10 mm port under direct vision. It was secured with a figure of  eight Vicryl suture placed in the usual fashion. Pneumoperitoneum was then created with CO2 and tolerated well without any adverse changes in the patient's vital signs. Additional trocars were introduced under direct vision. All skin incisions were infiltrated with a local anesthetic agent before making the incision and placing the trocars. Severe acute on chronic cholecystitis noted with colon adherent to gallbladder and carefully dissected away.No injury noted upon inspection.  The gallbladder was identified, the fundus grasped and retracted cephalad. Adhesions were lysed bluntly and with the electrocautery where indicated, taking care not to injure any adjacent organs or viscus. The infundibulum was grasped and retracted laterally, exposing the peritoneum overlying the triangle of Calot. This was then divided and exposed in a blunt fashion. The cystic duct was clearly identified and bluntly dissected circumferentially. The junctions of the gallbladder, cystic duct and common bile duct were clearly identified prior to the division of any linear structure. Critical view obtained.  An incision was made in the cystic duct and the cholangiogram catheter introduced. The catheter was secured using an endoclip. The study showed no stones and good visualization of the distal and proximal biliary tree. The catheter was then removed.   The cystic duct was then  ligated with surgical clips  on the patient side and  clipped on the gallbladder side and divided. The cystic artery was identified, dissected free, ligated with clips and divided as well. Posterior cystic artery branches were clipped and divided.  The gallbladder was dissected from the liver bed in retrograde fashion with the electrocautery. The gallbladder was removed with endocatch. The liver bed was irrigated and inspected. Hemostasis was achieved with the electrocautery. Copious irrigation was utilized and was repeatedly aspirated until clear all  particulate matter.  Pneumoperitoneum was completely reduced after viewing removal of the trocars under direct vision. The wound was thoroughly irrigated and the fascia was  then closed with a figure of eight suture; the skin was then closed with 4 0 monocryl and a sterile dressing was applied.  Instrument, sponge, and needle counts were correct at closure and at the conclusion of the case.   Findings: Cholecystitis with Cholelithiasis  Estimated Blood Loss: less than 100 mL         Drains: none         Total IV Fluids: 1200 mL         Specimens: Gallbladder           Complications: None; patient tolerated the procedure well.         Disposition: PACU - hemodynamically stable.         Condition: stable

## 2012-01-05 NOTE — Anesthesia Postprocedure Evaluation (Signed)
  Anesthesia Post-op Note  Patient: [name]  Procedure(s) Performed: Procedure(s) (LRB) with comments: LAPAROSCOPIC CHOLECYSTECTOMY WITH INTRAOPERATIVE CHOLANGIOGRAM (N/A) - laparoscopic cholecysectoym with intraoperative cholangiogram  Patient Location: PACU  Anesthesia Type: General  Level of Consciousness: awake, alert  and oriented  Airway and Oxygen Therapy: Patient Spontanous Breathing and Patient connected to nasal cannula oxygen  Post-op Pain: mild  Post-op Assessment: Post-op Vital signs reviewed  Post-op Vital Signs: Reviewed  Complications: No apparent anesthesia complications

## 2012-01-06 LAB — GLUCOSE, CAPILLARY
Glucose-Capillary: 119 mg/dL — ABNORMAL HIGH (ref 70–99)
Glucose-Capillary: 169 mg/dL — ABNORMAL HIGH (ref 70–99)
Glucose-Capillary: 205 mg/dL — ABNORMAL HIGH (ref 70–99)
Glucose-Capillary: 214 mg/dL — ABNORMAL HIGH (ref 70–99)
Glucose-Capillary: 226 mg/dL — ABNORMAL HIGH (ref 70–99)

## 2012-01-06 LAB — COMPREHENSIVE METABOLIC PANEL
ALT: 37 U/L (ref 0–53)
Alkaline Phosphatase: 92 U/L (ref 39–117)
Chloride: 98 mEq/L (ref 96–112)
GFR calc Af Amer: 81 mL/min — ABNORMAL LOW (ref >90)
Glucose, Bld: 144 mg/dL — ABNORMAL HIGH (ref 70–99)
Potassium: 4.1 mEq/L (ref 3.5–5.1)
Sodium: 134 mEq/L — ABNORMAL LOW (ref 135–145)
Total Bilirubin: 0.8 mg/dL (ref 0.3–1.2)
Total Protein: 6.9 g/dL (ref 6.0–8.3)

## 2012-01-06 LAB — CBC
HCT: 42.2 % (ref 39.0–52.0)
MCHC: 33.2 g/dL (ref 30.0–36.0)
MCV: 90.4 fL (ref 78.0–100.0)
Platelets: 378 10*3/uL (ref 150–400)
RDW: 17.4 % — ABNORMAL HIGH (ref 11.5–15.5)

## 2012-01-06 MED ORDER — KETOROLAC TROMETHAMINE 15 MG/ML IJ SOLN
15.0000 mg | Freq: Four times a day (QID) | INTRAMUSCULAR | Status: DC | PRN
  Administered 2012-01-06 (×2): 15 mg via INTRAVENOUS
  Filled 2012-01-06 (×2): qty 1

## 2012-01-06 MED ORDER — INSULIN ASPART 100 UNIT/ML ~~LOC~~ SOLN
0.0000 [IU] | Freq: Three times a day (TID) | SUBCUTANEOUS | Status: DC
  Administered 2012-01-07: 5 [IU] via SUBCUTANEOUS

## 2012-01-06 MED ORDER — INSULIN ASPART 100 UNIT/ML ~~LOC~~ SOLN
0.0000 [IU] | Freq: Every day | SUBCUTANEOUS | Status: DC
  Administered 2012-01-06: 2 [IU] via SUBCUTANEOUS

## 2012-01-06 MED ORDER — SODIUM CHLORIDE 0.9 % IV BOLUS (SEPSIS): 500.0000 mL | Freq: Once | INTRAVENOUS | Status: DC

## 2012-01-06 NOTE — Progress Notes (Signed)
1 Day Post-Op  Subjective: Patient states he feels good and wants to go home. Drinking clear liquids. When questioned, it turns out that he has vomited 2 or 3 times, although not a large volume. Ambulating to bathroom and voiding uneventfully.  Afebrile. No tachycardia. WBC 17,200. Hemoglobin 14.0. Glucose 144. Liver function tests basically normal except AST 48.  Diabetes with reasonable control. On sliding scale insulin.  Objective: Vital signs in last 24 hours: Temp:  [97.7 F (36.5 C)-98.6 F (37 C)] 98.1 F (36.7 C) (09/21 0900) Pulse Rate:  [69-86] 80  (09/21 0900) Resp:  [18-23] 18  (09/21 0900) BP: (108-141)/(54-81) 108/54 mmHg (09/21 0900) SpO2:  [93 %-98 %] 93 % (09/21 0900) Last BM Date: 01/05/12  Intake/Output from previous day: 09/20 0701 - 09/21 0700 In: 2210 [P.O.:960; I.V.:1250] Out: 825 [Urine:775; Blood:50] Intake/Output this shift: Total I/O In: 240 [P.O.:240] Out: 350 [Urine:350]  General appearance: cooperative and alert. Doesn't appear to be in any distress. Mental status normal. Lying in bed. GI: abdomen soft. Not really tender. Wounds look fine. Hypoactive bowel sounds. Slightly protuberant but not tympanitic or distended.  Lab Results:  Results for orders placed during the hospital encounter of 01/05/12 (from the past 24 hour(s))  GLUCOSE, CAPILLARY     Status: Abnormal   Collection Time   01/05/12  1:57 PM      Component Value Range   Glucose-Capillary 184 (*) 70 - 99 mg/dL  GLUCOSE, CAPILLARY     Status: Abnormal   Collection Time   01/05/12  5:56 PM      Component Value Range   Glucose-Capillary 220 (*) 70 - 99 mg/dL  GLUCOSE, CAPILLARY     Status: Abnormal   Collection Time   01/05/12  8:32 PM      Component Value Range   Glucose-Capillary 186 (*) 70 - 99 mg/dL  CBC     Status: Abnormal   Collection Time   01/05/12  8:58 PM      Component Value Range   WBC 16.3 (*) 4.0 - 10.5 K/uL   RBC 4.50  4.22 - 5.81 MIL/uL   Hemoglobin 13.4  13.0 -  17.0 g/dL   HCT 40.9  81.1 - 91.4 %   MCV 89.1  78.0 - 100.0 fL   MCH 29.8  26.0 - 34.0 pg   MCHC 33.4  30.0 - 36.0 g/dL   RDW 78.2 (*) 95.6 - 21.3 %   Platelets 373  150 - 400 K/uL  CREATININE, SERUM     Status: Abnormal   Collection Time   01/05/12  8:58 PM      Component Value Range   Creatinine, Ser 0.96  0.50 - 1.35 mg/dL   GFR calc non Af Amer 83 (*) >90 mL/min   GFR calc Af Amer >90  >90 mL/min  HEMOGLOBIN A1C     Status: Abnormal   Collection Time   01/05/12  8:58 PM      Component Value Range   Hemoglobin A1C 8.9 (*) <5.7 %   Mean Plasma Glucose 209 (*) <117 mg/dL  GLUCOSE, CAPILLARY     Status: Abnormal   Collection Time   01/06/12 12:19 AM      Component Value Range   Glucose-Capillary 110 (*) 70 - 99 mg/dL   Comment 1 Notify RN    GLUCOSE, CAPILLARY     Status: Abnormal   Collection Time   01/06/12  4:21 AM      Component Value Range  Glucose-Capillary 119 (*) 70 - 99 mg/dL  COMPREHENSIVE METABOLIC PANEL     Status: Abnormal   Collection Time   01/06/12  5:32 AM      Component Value Range   Sodium 134 (*) 135 - 145 mEq/L   Potassium 4.1  3.5 - 5.1 mEq/L   Chloride 98  96 - 112 mEq/L   CO2 28  19 - 32 mEq/L   Glucose, Bld 144 (*) 70 - 99 mg/dL   BUN 19  6 - 23 mg/dL   Creatinine, Ser 1.61  0.50 - 1.35 mg/dL   Calcium 9.3  8.4 - 09.6 mg/dL   Total Protein 6.9  6.0 - 8.3 g/dL   Albumin 3.1 (*) 3.5 - 5.2 g/dL   AST 48 (*) 0 - 37 U/L   ALT 37  0 - 53 U/L   Alkaline Phosphatase 92  39 - 117 U/L   Total Bilirubin 0.8  0.3 - 1.2 mg/dL   GFR calc non Af Amer 70 (*) >90 mL/min   GFR calc Af Amer 81 (*) >90 mL/min  CBC     Status: Abnormal   Collection Time   01/06/12  5:32 AM      Component Value Range   WBC 17.2 (*) 4.0 - 10.5 K/uL   RBC 4.67  4.22 - 5.81 MIL/uL   Hemoglobin 14.0  13.0 - 17.0 g/dL   HCT 04.5  40.9 - 81.1 %   MCV 90.4  78.0 - 100.0 fL   MCH 30.0  26.0 - 34.0 pg   MCHC 33.2  30.0 - 36.0 g/dL   RDW 91.4 (*) 78.2 - 95.6 %   Platelets 378  150  - 400 K/uL  GLUCOSE, CAPILLARY     Status: Abnormal   Collection Time   01/06/12  8:13 AM      Component Value Range   Glucose-Capillary 169 (*) 70 - 99 mg/dL   Comment 1 Notify RN       Studies/Results: @RISRSLT24 @     . allopurinol  100 mg Oral BID  . atorvastatin  40 mg Oral Daily  . carvedilol  25 mg Oral BID WC  .  ceFAZolin (ANCEF) IV  3 g Intravenous 60 min Pre-Op  . enoxaparin (LOVENOX) injection  40 mg Subcutaneous Q24H  . furosemide  40 mg Oral BID  . HYDROmorphone      . HYDROmorphone      . insulin aspart  0-15 Units Subcutaneous Q4H  . niacin  1,000 mg Oral BID  . ranolazine  1,000 mg Oral BID  . spironolactone  25 mg Oral Daily  . venlafaxine XR  150 mg Oral Daily     Assessment/Plan: s/p Procedure(s): LAPAROSCOPIC CHOLECYSTECTOMY WITH INTRAOPERATIVE CHOLANGIOGRAM  POD #1. Clinically his abdominal exam is benign and I have a low index of suspicion of intra-abdominal complications.  Nausea, vomiting, and leukocytosis are of some concern. I do not think it is safe to discharge home at this time Discontinue narcotic pain medication in case  causing nausea. Substitute Toradol. Advance diet when nausea resolves Check lab work tomorrow morning.  Diabetes mellitus. Reasonable  control awith sliding scale insulin.  Chronic systolic CHF Ischemic CM H/o MI H/o CABG ICD  HTN  obesity    Patient Active Hospital Problem List: No active hospital problems.   LOS: 1 day    Elianie Hubers M. Derrell Lolling, M.D., [facility] General and Minimally invasive Surgery Breast and Colorectal Surgery Office:  [phone] Pager:   [phone]  01/06/2012  . .prob

## 2012-01-07 LAB — LIPASE, BLOOD: Lipase: 17 U/L (ref 11–59)

## 2012-01-07 LAB — COMPREHENSIVE METABOLIC PANEL
Albumin: 2.5 g/dL — ABNORMAL LOW (ref 3.5–5.2)
BUN: 26 mg/dL — ABNORMAL HIGH (ref 6–23)
Calcium: 9.1 mg/dL (ref 8.4–10.5)
Creatinine, Ser: 1.46 mg/dL — ABNORMAL HIGH (ref 0.50–1.35)
Total Bilirubin: 0.6 mg/dL (ref 0.3–1.2)
Total Protein: 6.2 g/dL (ref 6.0–8.3)

## 2012-01-07 LAB — CBC
HCT: 35.4 % — ABNORMAL LOW (ref 39.0–52.0)
MCH: 29.7 pg (ref 26.0–34.0)
MCHC: 32.8 g/dL (ref 30.0–36.0)
MCV: 90.5 fL (ref 78.0–100.0)
Platelets: 255 10*3/uL (ref 150–400)
RDW: 17.3 % — ABNORMAL HIGH (ref 11.5–15.5)

## 2012-01-07 MED ORDER — HYDROCODONE-ACETAMINOPHEN 5-325 MG PO TABS: 1.0000 | ORAL_TABLET | ORAL | Status: DC | PRN

## 2012-01-07 NOTE — Discharge Summary (Signed)
I have interviewed and examined this patient today. I agree with the discharge summary outlined on Mr. Gerda Diss. I've discussed followup plans, diet and activities with the patient.  Angelia Mould. Derrell Lolling, M.D., [facility] General and Minimally invasive Surgery Breast and Colorectal Surgery Office:   [phone] Pager:   [phone]

## 2012-01-07 NOTE — Discharge Summary (Signed)
Physician Discharge Summary  Patient ID: [name] MRN: [id number] DOB/AGE: [birth year] 71 y.o.  Admit date: 01/05/2012 Discharge date: 01/07/2012  Admission Diagnoses: Nausea vomiting and dizziness  Discharge Diagnoses: Cholecystitis S/P cholecystectomy Active Problems:  * No active hospital problems. *    Discharged Condition: stable  Hospital Course: This is a 71 year old male with history of ischemic cardiomyopathy last EF measured was 30%, diabetes mellitus type 2, hypertension, AICD placement, who presented the ER because of nausea vomiting and dizziness. Patient has been having nausea and vomiting with diarrhea last 2-3 days. Denies any use of antibiotics or recent hospitalization. In addition patient also complains of right lower quadrant pain. Yesterday patient felt weak and dizzy but it was standing. In the ER patient was found to be orthostatic. Was given IV fluid bolus and has been admitted for further management. At this time patient still complains of mild right lower quadrant discomfort.  [place] of  abdomen done on 12/24/11:  Findings:  Gallbladder: Gallstones and sludge in the gallbladder. The  largest individual stone measures 9 mm in maximum diameter.  Diffuse gallbladder wall thickening with a maximum thickness of  13.1 mm. Small amount of pericholecystic fluid. The patient was  focally tender over the gallbladder.  Common bile duct: Limited visualization. The visualized portion  measures 5.4 mm in diameter proximally.  The patient was treated for his cardiac issues. He was then cleared for discharge.Due to being on Plavix it was decided to allow him to him return to [facility] once his plavix had been off for at least 7 days. This was accomplished and he was then brought back to [facility] for his elective cholecystectomy which he underwent on 01/05/12 by dr. Luisa Hart. Post op he has done well remained HD stable and afebrile. His WBC count did elevate to a max of 17.2 on 01/06/12 but is now  again trending downward. He has progressed to a regular diet w/o N/V and from a clinical standpoint he is stable for discharge to home/self care today. He will be sent home on a short course of po antibiotics prophylactically and he will follow up with Dr. Luisa Hart in 1 weeks to 10 days time.   Consults: None  Significant Diagnostic Studies: labs, microbiology,and radiology.  Treatments: IV hydration, antibiotics, analgesia and surgery.  Discharge Exam: Blood pressure 99/54, pulse 74, temperature 98.2 F (36.8 C), temperature source Oral, resp. rate 18, SpO2 94.00%. General appearance: alert, cooperative, appears stated age and no distress Abdomen: No bloating, +BS BM flatus no N?V tolerating diet  Surgical wound sites are all well approximated w/o erythema or drainage Chest: CTA Cardiac: RRR Extremities: warm, no edema + pulses  Disposition: 01-Home or Self Care  Discharge Orders    Future Appointments: Provider: Department: Dept Phone: Center:   01/22/2012 1:30 PM Maisie Fus A. Cornett, MD [facility] [phone] None   03/04/2012 10:00 AM [facility] [phone] [facility]       Medication List     As of 01/07/2012  9:42 AM    ASK your doctor about these medications         allopurinol 100 MG tablet   Commonly known as: ZYLOPRIM   Take 100 mg by mouth 2 (two) times daily.      aspirin EC 81 MG tablet   Take 81 mg by mouth daily.      atorvastatin 40 MG tablet   Commonly known as: LIPITOR   Take 40 mg by mouth daily.  carvedilol 25 MG tablet   Commonly known as: COREG   Take 25 mg by mouth 2 (two) times daily.      clopidogrel 75 MG tablet   Commonly known as: PLAVIX   Take 75 mg by mouth daily.      eplerenone 25 MG tablet   Commonly known as: INSPRA   Take 25 mg by mouth daily.      furosemide 40 MG tablet   Commonly known as: LASIX   Take 40 mg by mouth 2 (two) times daily.      insulin glargine 100 UNIT/ML  injection   Commonly known as: LANTUS   Inject 35 Units into the skin 2 (two) times daily.      insulin glulisine 100 UNIT/ML injection   Commonly known as: APIDRA   Inject 35-40 Units into the skin 2 (two) times daily. Inject 35 units in the morning and 40 units at night.      niacin 500 MG CR tablet   Commonly known as: NIASPAN   Take 1,000 mg by mouth 2 (two) times daily.      nitroGLYCERIN 0.4 MG SL tablet   Commonly known as: NITROSTAT   Place 0.4 mg under the tongue every 5 (five) minutes x 3 doses as needed. For chest pain.      ranolazine 500 MG 12 hr tablet   Commonly known as: RANEXA   Take 1,000 mg by mouth 2 (two) times daily.      triamcinolone cream 0.1 %   Commonly known as: KENALOG   Apply 1 application topically daily as needed. Apply to chest for rash/dry skin.      venlafaxine XR 150 MG 24 hr capsule   Commonly known as: EFFEXOR-XR   Take 150 mg by mouth daily.      zolpidem 12.5 MG CR tablet   Commonly known as: AMBIEN CR   Take 12.5 mg by mouth at bedtime as needed. For sleep         Signed: Blenda Mounts 01/07/2012, 9:42 AM

## 2012-01-07 NOTE — Progress Notes (Signed)
2 Days Post-Op  Subjective: Feels much better and is bagging to go home. Dressed in history closed.  No more nausea and vomiting. Tolerating regular diet. Having bowel movements. Almost no pain.  Lab work shows BUN 26, creatinine 1.46, slightly elevated. White count down to 13,400 Urine output 1,125 cc last 24 hours.  Objective: Vital signs in last 24 hours: Temp:  [98.1 F (36.7 C)-98.6 F (37 C)] 98.2 F (36.8 C) (09/22 0553) Pulse Rate:  [67-79] 74  (09/22 0553) Resp:  [18] 18  (09/22 0553) BP: (82-108)/(50-58) 99/54 mmHg (09/22 0553) SpO2:  [93 %-94 %] 94 % (09/22 0553) Last BM Date: 01/06/12  Intake/Output from previous day: 09/21 0701 - 09/22 0700 In: 2238.3 [P.O.:720; I.V.:1518.3] Out: 1125 [Urine:1125] Intake/Output this shift: Total I/O In: -  Out: 600 [Urine:600]  General appearance: looks good. No distress. GI: abdomen soft. Wound is clean. Nontender.  Lab Results:  Results for orders placed during the hospital encounter of 01/05/12 (from the past 24 hour(s))  GLUCOSE, CAPILLARY     Status: Abnormal   Collection Time   01/06/12 12:20 PM      Component Value Range   Glucose-Capillary 226 (*) 70 - 99 mg/dL   Comment 1 Notify RN    GLUCOSE, CAPILLARY     Status: Abnormal   Collection Time   01/06/12  3:53 PM      Component Value Range   Glucose-Capillary 239 (*) 70 - 99 mg/dL   Comment 1 Notify RN    GLUCOSE, CAPILLARY     Status: Abnormal   Collection Time   01/06/12  8:07 PM      Component Value Range   Glucose-Capillary 214 (*) 70 - 99 mg/dL   Comment 1 Notify RN     Comment 2 Documented in Chart    GLUCOSE, CAPILLARY     Status: Abnormal   Collection Time   01/06/12 11:33 PM      Component Value Range   Glucose-Capillary 205 (*) 70 - 99 mg/dL  CBC     Status: Abnormal   Collection Time   01/07/12  4:55 AM      Component Value Range   WBC 13.4 (*) 4.0 - 10.5 K/uL   RBC 3.91 (*) 4.22 - 5.81 MIL/uL   Hemoglobin 11.6 (*) 13.0 - 17.0 g/dL   HCT 16.1  (*) 09.6 - 52.0 %   MCV 90.5  78.0 - 100.0 fL   MCH 29.7  26.0 - 34.0 pg   MCHC 32.8  30.0 - 36.0 g/dL   RDW 04.5 (*) 40.9 - 81.1 %   Platelets 255  150 - 400 K/uL  COMPREHENSIVE METABOLIC PANEL     Status: Abnormal   Collection Time   01/07/12  4:55 AM      Component Value Range   Sodium 133 (*) 135 - 145 mEq/L   Potassium 4.3  3.5 - 5.1 mEq/L   Chloride 97  96 - 112 mEq/L   CO2 29  19 - 32 mEq/L   Glucose, Bld 179 (*) 70 - 99 mg/dL   BUN 26 (*) 6 - 23 mg/dL   Creatinine, Ser 9.14 (*) 0.50 - 1.35 mg/dL   Calcium 9.1  8.4 - 78.2 mg/dL   Total Protein 6.2  6.0 - 8.3 g/dL   Albumin 2.5 (*) 3.5 - 5.2 g/dL   AST 25  0 - 37 U/L   ALT 21  0 - 53 U/L   Alkaline Phosphatase 71  39 - 117 U/L   Total Bilirubin 0.6  0.3 - 1.2 mg/dL   GFR calc non Af Amer 47 (*) >90 mL/min   GFR calc Af Amer 55 (*) >90 mL/min  LIPASE, BLOOD     Status: Normal   Collection Time   01/07/12  4:55 AM      Component Value Range   Lipase 17  11 - 59 U/L  GLUCOSE, CAPILLARY     Status: Abnormal   Collection Time   01/07/12  8:00 AM      Component Value Range   Glucose-Capillary 214 (*) 70 - 99 mg/dL   Comment 1 Documented in Chart     Comment 2 Notify RN       Studies/Results: @RISRSLT24 @     . allopurinol  100 mg Oral BID  . atorvastatin  40 mg Oral Daily  . carvedilol  25 mg Oral BID WC  . enoxaparin (LOVENOX) injection  40 mg Subcutaneous Q24H  . furosemide  40 mg Oral BID  . insulin aspart  0-15 Units Subcutaneous TID WC  . insulin aspart  0-5 Units Subcutaneous QHS  . niacin  1,000 mg Oral BID  . ranolazine  1,000 mg Oral BID  . sodium chloride  500 mL Intravenous Once  . spironolactone  25 mg Oral Daily  . venlafaxine XR  150 mg Oral Daily  . DISCONTD: insulin aspart  0-15 Units Subcutaneous Q4H     Assessment/Plan: s/p Procedure(s): LAPAROSCOPIC CHOLECYSTECTOMY WITH INTRAOPERATIVE CHOLANGIOGRAM  POD #2. Clinically his abdominal exam is benign and I have a low index of suspicion of  intra-abdominal complications.  GI function has normalized, leukocytosis is resolving, and I think it is safe to discharge home.  Borderline elevation of BUN and creatinine. This was discussed with patient and wife. Probable mild dehydration. Fluid intake encouraged. He was strongly urged to follow up with Dr. Cam Hai to check his blood pressure medications and renal function.    Diet and activities discussed .  Diabetes mellitus. Reasonable control awith sliding scale insulin.  Chronic systolic CHF  Ischemic CM  H/o MI  H/o CABG  ICD  HTN  obesity     LOS: 2 days    Jasime Westergren M. Derrell Lolling, M.D., [facility] General and Minimally invasive Surgery Breast and Colorectal Surgery Office:   [phone] Pager:   [phone]  01/07/2012  . .prob

## 2012-01-09 ENCOUNTER — Encounter (HOSPITAL_COMMUNITY): Payer: Self-pay | Admitting: Surgery

## 2012-01-10 DIAGNOSIS — IMO0001 Reserved for inherently not codable concepts without codable children: Secondary | ICD-10-CM | POA: Diagnosis not present

## 2012-01-10 DIAGNOSIS — N179 Acute kidney failure, unspecified: Secondary | ICD-10-CM | POA: Diagnosis not present

## 2012-01-10 DIAGNOSIS — I2589 Other forms of chronic ischemic heart disease: Secondary | ICD-10-CM | POA: Diagnosis not present

## 2012-01-10 DIAGNOSIS — F329 Major depressive disorder, single episode, unspecified: Secondary | ICD-10-CM | POA: Diagnosis not present

## 2012-01-10 DIAGNOSIS — I5022 Chronic systolic (congestive) heart failure: Secondary | ICD-10-CM | POA: Diagnosis not present

## 2012-01-10 DIAGNOSIS — K810 Acute cholecystitis: Secondary | ICD-10-CM | POA: Diagnosis not present

## 2012-01-10 DIAGNOSIS — Z23 Encounter for immunization: Secondary | ICD-10-CM | POA: Diagnosis not present

## 2012-01-19 DIAGNOSIS — Z79899 Other long term (current) drug therapy: Secondary | ICD-10-CM | POA: Diagnosis not present

## 2012-01-22 ENCOUNTER — Encounter (INDEPENDENT_AMBULATORY_CARE_PROVIDER_SITE_OTHER): Payer: Self-pay | Admitting: Surgery

## 2012-01-22 ENCOUNTER — Ambulatory Visit (INDEPENDENT_AMBULATORY_CARE_PROVIDER_SITE_OTHER): Payer: Managed Care, Other (non HMO) | Admitting: Surgery

## 2012-01-22 VITALS — BP 118/80 | HR 80 | Resp 14 | Ht 68.0 in | Wt 216.0 lb

## 2012-01-22 DIAGNOSIS — Z9889 Other specified postprocedural states: Secondary | ICD-10-CM

## 2012-01-22 NOTE — Patient Instructions (Signed)
Return as needed

## 2012-01-22 NOTE — Progress Notes (Signed)
NAME: [name]       DOB: 02-24-1941           DATE: 01/22/2012       NWG:[id number]   CC: Postop laparoscopic cholecystectomy  Impression:  The patient appears to be doing well, with improvement in his symptoms.  Plan:  He may resume full activity and regular diet. He  will followup with [place] on a p.r.n. basis. I did tell him that he may still have some foods that cause indigestion and ask him to call us if there are any questions, problems or concerns.  HPI:  This patient underwent a laparoscopic cholecystectomy  operative cholangiogram on 01/05/2012. He is in for his first postoperative visit. He notes that his incisional pain has resolved. His preoperative symptoms have improved. He is not having problems with nausea, vomiting, diarrhea, fevers, chills, or urinary symptoms. He is tolerating diet. He feels that he is progressing well and nearly back to normal. PE:  VS: BP 118/80  Pulse 80  Resp 14  Ht 5\' 8"  (1.727 m)  Wt 216 lb (97.977 kg)  BMI 32.84 kg/m2  General: The patient is alert and appears comfortable, NAD.  Abdomen: Soft and benign. The incisions are healing nicely. There are no apparent problems.  Data reviewed: IOC:  normal Pathology:  ACUTE AND CHRONIC CHOLECYSTITIS WITH NECROSIS

## 2012-02-05 DIAGNOSIS — Z79899 Other long term (current) drug therapy: Secondary | ICD-10-CM | POA: Diagnosis not present

## 2012-02-22 DIAGNOSIS — IMO0001 Reserved for inherently not codable concepts without codable children: Secondary | ICD-10-CM | POA: Diagnosis not present

## 2012-02-22 DIAGNOSIS — E782 Mixed hyperlipidemia: Secondary | ICD-10-CM | POA: Diagnosis not present

## 2012-02-27 ENCOUNTER — Encounter: Payer: Self-pay | Admitting: *Deleted

## 2012-02-27 DIAGNOSIS — L0211 Cutaneous abscess of neck: Secondary | ICD-10-CM | POA: Diagnosis not present

## 2012-02-27 DIAGNOSIS — Z95810 Presence of automatic (implantable) cardiac defibrillator: Secondary | ICD-10-CM | POA: Insufficient documentation

## 2012-02-27 DIAGNOSIS — L03221 Cellulitis of neck: Secondary | ICD-10-CM | POA: Diagnosis not present

## 2012-03-04 ENCOUNTER — Encounter: Payer: Self-pay | Admitting: Internal Medicine

## 2012-03-04 ENCOUNTER — Ambulatory Visit (INDEPENDENT_AMBULATORY_CARE_PROVIDER_SITE_OTHER): Payer: Managed Care, Other (non HMO) | Admitting: *Deleted

## 2012-03-04 DIAGNOSIS — I255 Ischemic cardiomyopathy: Secondary | ICD-10-CM

## 2012-03-04 DIAGNOSIS — I2589 Other forms of chronic ischemic heart disease: Secondary | ICD-10-CM | POA: Diagnosis not present

## 2012-03-04 LAB — ICD DEVICE OBSERVATION
AL THRESHOLD: 1 V
BAMS-0001: 170 {beats}/min
BATTERY VOLTAGE: 3.179 V
CHARGE TIME: 8.708 s
FVT: 0
LV LEAD THRESHOLD: 0.75 V
PACEART VT: 0
RV LEAD AMPLITUDE: 20 mv
TOT-0001: 1
TZAT-0001ATACH: 3
TZAT-0002ATACH: NEGATIVE
TZAT-0002ATACH: NEGATIVE
TZAT-0004SLOWVT: 8
TZAT-0011SLOWVT: 10 ms
TZAT-0012ATACH: 150 ms
TZAT-0012FASTVT: 170 ms
TZAT-0012SLOWVT: 170 ms
TZAT-0018ATACH: NEGATIVE
TZAT-0018FASTVT: NEGATIVE
TZAT-0019ATACH: 6 V
TZAT-0019ATACH: 6 V
TZAT-0020ATACH: 1.5 ms
TZAT-0020FASTVT: 1.5 ms
TZAT-0020SLOWVT: 1.5 ms
TZON-0003SLOWVT: 350 ms
TZST-0001ATACH: 5
TZST-0001FASTVT: 2
TZST-0001FASTVT: 6
TZST-0001SLOWVT: 4
TZST-0002ATACH: NEGATIVE
TZST-0002ATACH: NEGATIVE
TZST-0002ATACH: NEGATIVE
TZST-0002FASTVT: NEGATIVE
TZST-0003SLOWVT: 30 J
TZST-0003SLOWVT: 35 J
TZST-0003SLOWVT: 35 J

## 2012-03-04 NOTE — Patient Instructions (Addendum)
Return office visit 06/03/12@ 10:00am with the device clinic.

## 2012-03-04 NOTE — Progress Notes (Signed)
ICD check with ICM 

## 2012-03-19 DIAGNOSIS — F329 Major depressive disorder, single episode, unspecified: Secondary | ICD-10-CM | POA: Diagnosis not present

## 2012-03-19 DIAGNOSIS — I5022 Chronic systolic (congestive) heart failure: Secondary | ICD-10-CM | POA: Diagnosis not present

## 2012-03-19 DIAGNOSIS — I10 Essential (primary) hypertension: Secondary | ICD-10-CM | POA: Diagnosis not present

## 2012-03-19 DIAGNOSIS — K219 Gastro-esophageal reflux disease without esophagitis: Secondary | ICD-10-CM | POA: Diagnosis not present

## 2012-03-19 DIAGNOSIS — I2510 Atherosclerotic heart disease of native coronary artery without angina pectoris: Secondary | ICD-10-CM | POA: Diagnosis not present

## 2012-03-19 DIAGNOSIS — Z95810 Presence of automatic (implantable) cardiac defibrillator: Secondary | ICD-10-CM | POA: Diagnosis not present

## 2012-03-19 DIAGNOSIS — E669 Obesity, unspecified: Secondary | ICD-10-CM | POA: Diagnosis not present

## 2012-03-19 DIAGNOSIS — E7800 Pure hypercholesterolemia, unspecified: Secondary | ICD-10-CM | POA: Diagnosis not present

## 2012-04-03 DIAGNOSIS — I10 Essential (primary) hypertension: Secondary | ICD-10-CM | POA: Diagnosis not present

## 2012-04-03 DIAGNOSIS — I2510 Atherosclerotic heart disease of native coronary artery without angina pectoris: Secondary | ICD-10-CM | POA: Diagnosis not present

## 2012-04-03 DIAGNOSIS — Z95810 Presence of automatic (implantable) cardiac defibrillator: Secondary | ICD-10-CM | POA: Diagnosis not present

## 2012-04-03 DIAGNOSIS — I2589 Other forms of chronic ischemic heart disease: Secondary | ICD-10-CM | POA: Diagnosis not present

## 2012-04-03 DIAGNOSIS — E7800 Pure hypercholesterolemia, unspecified: Secondary | ICD-10-CM | POA: Diagnosis not present

## 2012-04-25 DIAGNOSIS — Z79899 Other long term (current) drug therapy: Secondary | ICD-10-CM | POA: Diagnosis not present

## 2012-04-25 DIAGNOSIS — N289 Disorder of kidney and ureter, unspecified: Secondary | ICD-10-CM | POA: Diagnosis not present

## 2012-04-25 DIAGNOSIS — I2510 Atherosclerotic heart disease of native coronary artery without angina pectoris: Secondary | ICD-10-CM | POA: Diagnosis not present

## 2012-04-25 DIAGNOSIS — IMO0001 Reserved for inherently not codable concepts without codable children: Secondary | ICD-10-CM | POA: Diagnosis not present

## 2012-04-25 DIAGNOSIS — I10 Essential (primary) hypertension: Secondary | ICD-10-CM | POA: Diagnosis not present

## 2012-04-25 DIAGNOSIS — E782 Mixed hyperlipidemia: Secondary | ICD-10-CM | POA: Diagnosis not present

## 2012-05-09 DIAGNOSIS — R748 Abnormal levels of other serum enzymes: Secondary | ICD-10-CM | POA: Diagnosis not present

## 2012-06-04 DIAGNOSIS — IMO0001 Reserved for inherently not codable concepts without codable children: Secondary | ICD-10-CM | POA: Diagnosis not present

## 2012-06-10 ENCOUNTER — Ambulatory Visit (INDEPENDENT_AMBULATORY_CARE_PROVIDER_SITE_OTHER): Payer: Managed Care, Other (non HMO) | Admitting: *Deleted

## 2012-06-10 ENCOUNTER — Other Ambulatory Visit: Payer: Self-pay | Admitting: Internal Medicine

## 2012-06-10 ENCOUNTER — Encounter: Payer: Self-pay | Admitting: Internal Medicine

## 2012-06-10 DIAGNOSIS — I509 Heart failure, unspecified: Secondary | ICD-10-CM | POA: Diagnosis not present

## 2012-06-10 DIAGNOSIS — I255 Ischemic cardiomyopathy: Secondary | ICD-10-CM

## 2012-06-10 DIAGNOSIS — I2589 Other forms of chronic ischemic heart disease: Secondary | ICD-10-CM | POA: Diagnosis not present

## 2012-06-10 LAB — ICD DEVICE OBSERVATION
AL AMPLITUDE: 2.1 mv
AL IMPEDENCE ICD: 456 Ohm
AL THRESHOLD: 1 V
CHARGE TIME: 8.7 s
LV LEAD THRESHOLD: 0.75 V
RV LEAD IMPEDENCE ICD: 627 Ohm
RV LEAD THRESHOLD: 0.5 V
TZAT-0001ATACH: 1
TZAT-0001ATACH: 3
TZAT-0001SLOWVT: 1
TZAT-0002ATACH: NEGATIVE
TZAT-0002FASTVT: NEGATIVE
TZAT-0004SLOWVT: 8
TZAT-0005SLOWVT: 88 pct
TZAT-0011SLOWVT: 10 ms
TZAT-0012ATACH: 150 ms
TZAT-0012ATACH: 150 ms
TZAT-0018ATACH: NEGATIVE
TZAT-0018ATACH: NEGATIVE
TZAT-0018FASTVT: NEGATIVE
TZAT-0018SLOWVT: NEGATIVE
TZAT-0019FASTVT: 8 V
TZAT-0020ATACH: 1.5 ms
TZAT-0020ATACH: 1.5 ms
TZAT-0020FASTVT: 1.5 ms
TZON-0003ATACH: 350 ms
TZON-0003VSLOWVT: 450 ms
TZON-0005SLOWVT: 12
TZST-0001ATACH: 4
TZST-0001ATACH: 6
TZST-0001FASTVT: 3
TZST-0001FASTVT: 5
TZST-0001FASTVT: 6
TZST-0001SLOWVT: 2
TZST-0001SLOWVT: 3
TZST-0001SLOWVT: 5
TZST-0002ATACH: NEGATIVE
TZST-0002ATACH: NEGATIVE
TZST-0002ATACH: NEGATIVE
TZST-0002FASTVT: NEGATIVE
TZST-0002FASTVT: NEGATIVE
TZST-0002FASTVT: NEGATIVE
TZST-0003SLOWVT: 30 J
TZST-0003SLOWVT: 35 J
TZST-0003SLOWVT: 35 J

## 2012-06-10 NOTE — Progress Notes (Signed)
defib check in clinic  

## 2012-06-11 DIAGNOSIS — Z79899 Other long term (current) drug therapy: Secondary | ICD-10-CM | POA: Diagnosis not present

## 2012-06-11 DIAGNOSIS — IMO0001 Reserved for inherently not codable concepts without codable children: Secondary | ICD-10-CM | POA: Diagnosis not present

## 2012-06-11 DIAGNOSIS — I2510 Atherosclerotic heart disease of native coronary artery without angina pectoris: Secondary | ICD-10-CM | POA: Diagnosis not present

## 2012-06-11 DIAGNOSIS — N189 Chronic kidney disease, unspecified: Secondary | ICD-10-CM | POA: Diagnosis not present

## 2012-06-11 DIAGNOSIS — E782 Mixed hyperlipidemia: Secondary | ICD-10-CM | POA: Diagnosis not present

## 2012-07-08 DIAGNOSIS — I10 Essential (primary) hypertension: Secondary | ICD-10-CM | POA: Diagnosis not present

## 2012-07-08 DIAGNOSIS — I2510 Atherosclerotic heart disease of native coronary artery without angina pectoris: Secondary | ICD-10-CM | POA: Diagnosis not present

## 2012-07-08 DIAGNOSIS — I5022 Chronic systolic (congestive) heart failure: Secondary | ICD-10-CM | POA: Diagnosis not present

## 2012-07-23 DIAGNOSIS — IMO0001 Reserved for inherently not codable concepts without codable children: Secondary | ICD-10-CM | POA: Diagnosis not present

## 2012-07-23 DIAGNOSIS — E782 Mixed hyperlipidemia: Secondary | ICD-10-CM | POA: Diagnosis not present

## 2012-07-23 DIAGNOSIS — N289 Disorder of kidney and ureter, unspecified: Secondary | ICD-10-CM | POA: Diagnosis not present

## 2012-08-26 ENCOUNTER — Encounter: Payer: Self-pay | Admitting: Internal Medicine

## 2012-09-04 ENCOUNTER — Ambulatory Visit (INDEPENDENT_AMBULATORY_CARE_PROVIDER_SITE_OTHER): Payer: Managed Care, Other (non HMO) | Admitting: *Deleted

## 2012-09-04 ENCOUNTER — Encounter: Payer: Self-pay | Admitting: Internal Medicine

## 2012-09-04 DIAGNOSIS — N183 Chronic kidney disease, stage 3 unspecified: Secondary | ICD-10-CM | POA: Diagnosis not present

## 2012-09-04 DIAGNOSIS — I255 Ischemic cardiomyopathy: Secondary | ICD-10-CM

## 2012-09-04 DIAGNOSIS — I2589 Other forms of chronic ischemic heart disease: Secondary | ICD-10-CM | POA: Diagnosis not present

## 2012-09-04 DIAGNOSIS — E782 Mixed hyperlipidemia: Secondary | ICD-10-CM | POA: Diagnosis not present

## 2012-09-04 DIAGNOSIS — IMO0001 Reserved for inherently not codable concepts without codable children: Secondary | ICD-10-CM | POA: Diagnosis not present

## 2012-09-04 DIAGNOSIS — I10 Essential (primary) hypertension: Secondary | ICD-10-CM | POA: Diagnosis not present

## 2012-09-04 LAB — ICD DEVICE OBSERVATION
AL AMPLITUDE: 2 mv
ATRIAL PACING ICD: 0.82 pct
CHARGE TIME: 8.978 s
LV LEAD IMPEDENCE ICD: 665 Ohm
RV LEAD IMPEDENCE ICD: 608 Ohm
TOT-0001: 1
TOT-0002: 0
TOT-0006: 20130509000000
TZAT-0001ATACH: 1
TZAT-0001ATACH: 2
TZAT-0002ATACH: NEGATIVE
TZAT-0002ATACH: NEGATIVE
TZAT-0002FASTVT: NEGATIVE
TZAT-0011SLOWVT: 10 ms
TZAT-0012ATACH: 150 ms
TZAT-0012ATACH: 150 ms
TZAT-0012SLOWVT: 170 ms
TZAT-0013SLOWVT: 3
TZAT-0018ATACH: NEGATIVE
TZAT-0018ATACH: NEGATIVE
TZAT-0018SLOWVT: NEGATIVE
TZAT-0019ATACH: 6 V
TZAT-0019SLOWVT: 8 V
TZAT-0020ATACH: 1.5 ms
TZAT-0020FASTVT: 1.5 ms
TZON-0003ATACH: 350 ms
TZON-0003VSLOWVT: 450 ms
TZON-0004VSLOWVT: 20
TZON-0005SLOWVT: 12
TZST-0001FASTVT: 3
TZST-0001FASTVT: 5
TZST-0001SLOWVT: 3
TZST-0001SLOWVT: 6
TZST-0002ATACH: NEGATIVE
TZST-0002ATACH: NEGATIVE
TZST-0002FASTVT: NEGATIVE
TZST-0003SLOWVT: 35 J
TZST-0003SLOWVT: 35 J

## 2012-09-04 NOTE — Progress Notes (Signed)
ICD check with ICM in clinic. 

## 2012-10-02 DIAGNOSIS — E7800 Pure hypercholesterolemia, unspecified: Secondary | ICD-10-CM | POA: Diagnosis not present

## 2012-10-02 DIAGNOSIS — I10 Essential (primary) hypertension: Secondary | ICD-10-CM | POA: Diagnosis not present

## 2012-10-02 DIAGNOSIS — I2589 Other forms of chronic ischemic heart disease: Secondary | ICD-10-CM | POA: Diagnosis not present

## 2012-10-02 DIAGNOSIS — I2510 Atherosclerotic heart disease of native coronary artery without angina pectoris: Secondary | ICD-10-CM | POA: Diagnosis not present

## 2012-10-02 DIAGNOSIS — I5022 Chronic systolic (congestive) heart failure: Secondary | ICD-10-CM | POA: Diagnosis not present

## 2012-10-23 DIAGNOSIS — R197 Diarrhea, unspecified: Secondary | ICD-10-CM | POA: Diagnosis not present

## 2012-10-23 DIAGNOSIS — E1129 Type 2 diabetes mellitus with other diabetic kidney complication: Secondary | ICD-10-CM | POA: Diagnosis not present

## 2012-10-23 DIAGNOSIS — E1165 Type 2 diabetes mellitus with hyperglycemia: Secondary | ICD-10-CM | POA: Diagnosis not present

## 2012-10-25 DIAGNOSIS — R197 Diarrhea, unspecified: Secondary | ICD-10-CM | POA: Diagnosis not present

## 2012-11-05 ENCOUNTER — Other Ambulatory Visit: Payer: Self-pay | Admitting: *Deleted

## 2012-11-05 DIAGNOSIS — E785 Hyperlipidemia, unspecified: Secondary | ICD-10-CM

## 2012-11-05 DIAGNOSIS — E119 Type 2 diabetes mellitus without complications: Secondary | ICD-10-CM

## 2012-11-06 ENCOUNTER — Other Ambulatory Visit: Payer: Managed Care, Other (non HMO)

## 2012-12-12 ENCOUNTER — Ambulatory Visit (INDEPENDENT_AMBULATORY_CARE_PROVIDER_SITE_OTHER): Payer: Managed Care, Other (non HMO) | Admitting: Internal Medicine

## 2012-12-12 ENCOUNTER — Encounter: Payer: Self-pay | Admitting: Internal Medicine

## 2012-12-12 VITALS — BP 134/77 | HR 78 | Ht 68.0 in | Wt 221.4 lb

## 2012-12-12 DIAGNOSIS — Z79899 Other long term (current) drug therapy: Secondary | ICD-10-CM | POA: Diagnosis not present

## 2012-12-12 DIAGNOSIS — I2510 Atherosclerotic heart disease of native coronary artery without angina pectoris: Secondary | ICD-10-CM | POA: Diagnosis not present

## 2012-12-12 DIAGNOSIS — I2589 Other forms of chronic ischemic heart disease: Secondary | ICD-10-CM | POA: Diagnosis not present

## 2012-12-12 DIAGNOSIS — I255 Ischemic cardiomyopathy: Secondary | ICD-10-CM

## 2012-12-12 DIAGNOSIS — I10 Essential (primary) hypertension: Secondary | ICD-10-CM

## 2012-12-12 DIAGNOSIS — I5022 Chronic systolic (congestive) heart failure: Secondary | ICD-10-CM | POA: Insufficient documentation

## 2012-12-12 DIAGNOSIS — I519 Heart disease, unspecified: Secondary | ICD-10-CM

## 2012-12-12 DIAGNOSIS — Z95810 Presence of automatic (implantable) cardiac defibrillator: Secondary | ICD-10-CM | POA: Diagnosis not present

## 2012-12-12 LAB — ICD DEVICE OBSERVATION
AL AMPLITUDE: 1.8 mv
AL THRESHOLD: 1 V
ATRIAL PACING ICD: 0.62 pct
BATTERY VOLTAGE: 3.1585 V
FVT: 0
LV LEAD IMPEDENCE ICD: 684 Ohm
LV LEAD THRESHOLD: 0.75 V
RV LEAD AMPLITUDE: 20 mv
RV LEAD IMPEDENCE ICD: 608 Ohm
RV LEAD THRESHOLD: 0.75 V
TOT-0006: 20130509000000
TZAT-0001ATACH: 1
TZAT-0001ATACH: 2
TZAT-0001ATACH: 3
TZAT-0001FASTVT: 1
TZAT-0001SLOWVT: 1
TZAT-0002ATACH: NEGATIVE
TZAT-0002ATACH: NEGATIVE
TZAT-0002FASTVT: NEGATIVE
TZAT-0005SLOWVT: 88 pct
TZAT-0011SLOWVT: 10 ms
TZAT-0012ATACH: 150 ms
TZAT-0012ATACH: 150 ms
TZAT-0012FASTVT: 170 ms
TZAT-0018ATACH: NEGATIVE
TZAT-0018ATACH: NEGATIVE
TZAT-0018FASTVT: NEGATIVE
TZAT-0018SLOWVT: NEGATIVE
TZAT-0019ATACH: 6 V
TZAT-0019FASTVT: 8 V
TZAT-0019SLOWVT: 8 V
TZAT-0020ATACH: 1.5 ms
TZAT-0020FASTVT: 1.5 ms
TZAT-0020SLOWVT: 1.5 ms
TZON-0003ATACH: 350 ms
TZON-0005SLOWVT: 12
TZST-0001ATACH: 4
TZST-0001ATACH: 5
TZST-0001ATACH: 6
TZST-0001FASTVT: 3
TZST-0001FASTVT: 5
TZST-0001SLOWVT: 2
TZST-0001SLOWVT: 5
TZST-0001SLOWVT: 6
TZST-0002ATACH: NEGATIVE
TZST-0002ATACH: NEGATIVE
TZST-0002FASTVT: NEGATIVE
TZST-0002FASTVT: NEGATIVE
TZST-0002FASTVT: NEGATIVE
TZST-0003SLOWVT: 30 J
TZST-0003SLOWVT: 35 J
TZST-0003SLOWVT: 35 J
VENTRICULAR PACING ICD: 99.87 pct
VF: 0

## 2012-12-12 NOTE — Progress Notes (Signed)
PCP: Brandon Raider, MD Primary Cardiologist:  Dr Thora Lance is a 72 y.o. male who presents today for routine electrophysiology followup.  Since his last office visit, the patient reports doing very well.  He is pleased with his improvement in CHF symptoms.  He has had a 3 acre garden this summer and is very active with this.  Today, he denies symptoms of palpitations, chest pain, shortness of breath,  lower extremity edema, dizziness, presyncope, syncope, or ICD shocks.  The patient is otherwise without complaint today.   Past Medical History  Diagnosis Date  . Ischemic cardiomyopathy     s/p ICD Implantation by Dr Amil Amen  . Chronic systolic dysfunction of left ventricle     EF 30%  . Hyperlipemia   . DJD (degenerative joint disease)   . Gout   . HTN (hypertension) 02/14/2011  . Depression   . Angina   . Myocardial infarction 1997  . Cardiac defibrillator in situ   . DM (diabetes mellitus) 02/14/2011  . CAD (coronary artery disease)     s/p CABG 1997, PCI (BMS) of SVG to RCA 3/09, redo CABG 02/2011 with SVG to PDA, SVG to Lcx, s/p cath 2.2013 with ocluded SVT to left circ and patent SVG to RCA  . CHF (congestive heart failure)     EF 30% by cath 05/2011  . ICD (implantable cardiac defibrillator) in place   . GERD (gastroesophageal reflux disease)   . Heart attack   . Complication of anesthesia     ONCE WITH BACK SURGERY DIFFICULT TO WAKE UP   Past Surgical History  Procedure Laterality Date  . Cardiac defibrillator placement  08/2004    initial placement, upgraded to BIV ICD by Dr Ladona Ridgel 08/24/11 (MDT)  . Knee arthrotomy  ~ 1978    RIGHT KNEE CARTILAGE REMOVED  . Coronary angioplasty with stent placement  06/2007    BMS to SVG to RCA  . Coronary angioplasty  02/2011  . Cataract extraction w/ intraocular lens  implant, bilateral  2012  . Cardiac defibrillator placement  2009  . Lumbar disc surgery  2003  . Back surgery  2003  . Coronary artery bypass graft  01/1996     CABG x 5 LIMA to LAD SVG to diag1,2,svg to om,svg to RCA  . Coronary artery bypass graft  03/06/2011    CABG X2; Procedure: REDO CORONARY ARTERY BYPASS GRAFTING (CABG);  Surgeon: Delight Ovens, MD;  Location: [facility] OR;  Service: Open Heart Surgery;  Laterality: N/A;  times two grafts using right saphenous vein harvested endoscopically.  . Cholecystectomy  01/05/2012    Procedure: LAPAROSCOPIC CHOLECYSTECTOMY WITH INTRAOPERATIVE CHOLANGIOGRAM;  Surgeon: Clovis Pu. Cornett, MD;  Location: [facility] OR;  Service: General;  Laterality: N/A;  laparoscopic cholecysectoym with intraoperative cholangiogram    Current Outpatient Prescriptions  Medication Sig Dispense Refill  . allopurinol (ZYLOPRIM) 100 MG tablet Take 100 mg by mouth 2 (two) times daily.       Marland Kitchen aspirin EC 81 MG tablet Take 81 mg by mouth daily.      Marland Kitchen atorvastatin (LIPITOR) 40 MG tablet Take 40 mg by mouth daily.       . benazepril (LOTENSIN) 5 MG tablet Take 5 mg by mouth daily.      . carvedilol (COREG) 25 MG tablet Take 25 mg by mouth 2 (two) times daily.       . clopidogrel (PLAVIX) 75 MG tablet Take 75 mg by mouth daily.       Marland Kitchen  eplerenone (INSPRA) 25 MG tablet Take 25 mg by mouth daily.       . furosemide (LASIX) 40 MG tablet Take 40 mg by mouth 2 (two) times daily.      Marland Kitchen HYDROcodone-acetaminophen (NORCO/VICODIN) 5-325 MG per tablet Take 1-2 tablets by mouth every 4 (four) hours as needed for pain.  30 tablet  1  . insulin glargine (LANTUS) 100 UNIT/ML injection Inject 35 Units into the skin 2 (two) times daily.       . insulin glulisine (APIDRA) 100 UNIT/ML injection Inject 35-40 Units into the skin 2 (two) times daily. Inject 35 units in the morning and 40 units at night.      . niacin (NIASPAN) 500 MG CR tablet Take 1,000 mg by mouth 2 (two) times daily.       . nitroGLYCERIN (NITROSTAT) 0.4 MG SL tablet Place 0.4 mg under the tongue every 5 (five) minutes x 3 doses as needed. For chest pain.      . ranolazine (RANEXA) 500 MG 12  hr tablet Take 1,000 mg by mouth 2 (two) times daily.      . traMADol (ULTRAM) 50 MG tablet Take 50 mg by mouth every 6 (six) hours as needed.      . triamcinolone (KENALOG) 0.1 % cream Apply 1 application topically daily as needed. Apply to chest for rash/dry skin.      Marland Kitchen venlafaxine (EFFEXOR-XR) 150 MG 24 hr capsule Take 150 mg by mouth daily.        Marland Kitchen zolpidem (AMBIEN CR) 12.5 MG CR tablet Take 12.5 mg by mouth at bedtime as needed. For sleep       No current facility-administered medications for this visit.    Physical Exam: Filed Vitals:   12/12/12 0944  BP: 134/77  Pulse: 78  Height: 5\' 8"  (1.727 m)  Weight: 221 lb 6.4 oz (100.426 kg)    GEN- The patient is well appearing, alert and oriented x 3 today.   Head- normocephalic, atraumatic Eyes-  Sclera clear, conjunctiva pink Ears- hearing intact Oropharynx- clear Lungs- Clear to ausculation bilaterally, normal work of breathing Chest- ICD pocket is well healed, skin over the incision is thin but not retracted Heart- Regular rate and rhythm, no murmurs, rubs or gallops, PMI not laterally displaced GI- soft, NT, ND, + BS Extremities- no clubbing, cyanosis, or edema  ICD interrogation- reviewed in detail today,  See PACEART report  ekg today reveals sinus rhythm, biv pacing  Assessment and Plan:  1. Chronic systolic dysfunction euvolemic and clinically improved with CRT Normal BiV ICD function See Pace Art report No changes today  2. HTN Stable No change required today   Will enroll in carelink Return in 1 year

## 2012-12-12 NOTE — Patient Instructions (Addendum)
Your physician wants you to follow-up in: 12 months with Dr Allred You will receive a reminder letter in the mail two months in advance. If you don't receive a letter, please call our office to schedule the follow-up appointment.  Remote monitoring is used to monitor your Pacemaker of ICD from home. This monitoring reduces the number of office visits required to check your device to one time per year. It allows us to keep an eye on the functioning of your device to ensure it is working properly. You are scheduled for a device check from home on 03/17/13. You may send your transmission at any time that day. If you have a wireless device, the transmission will be sent automatically. After your physician reviews your transmission, you will receive a postcard with your next transmission date.    

## 2013-01-28 ENCOUNTER — Other Ambulatory Visit: Payer: Self-pay | Admitting: General Surgery

## 2013-01-28 MED ORDER — ATORVASTATIN CALCIUM 40 MG PO TABS: 40.0000 mg | ORAL_TABLET | Freq: Every day | ORAL | Status: DC

## 2013-02-03 DIAGNOSIS — Z23 Encounter for immunization: Secondary | ICD-10-CM | POA: Diagnosis not present

## 2013-02-03 DIAGNOSIS — R42 Dizziness and giddiness: Secondary | ICD-10-CM | POA: Diagnosis not present

## 2013-02-03 DIAGNOSIS — E1129 Type 2 diabetes mellitus with other diabetic kidney complication: Secondary | ICD-10-CM | POA: Diagnosis not present

## 2013-02-27 ENCOUNTER — Other Ambulatory Visit: Payer: Self-pay

## 2013-02-27 MED ORDER — CLOPIDOGREL BISULFATE 75 MG PO TABS: 75.0000 mg | ORAL_TABLET | Freq: Every day | ORAL | Status: DC

## 2013-03-17 ENCOUNTER — Ambulatory Visit (INDEPENDENT_AMBULATORY_CARE_PROVIDER_SITE_OTHER): Payer: Managed Care, Other (non HMO) | Admitting: *Deleted

## 2013-03-17 ENCOUNTER — Encounter: Payer: Self-pay | Admitting: Internal Medicine

## 2013-03-17 DIAGNOSIS — I255 Ischemic cardiomyopathy: Secondary | ICD-10-CM

## 2013-03-17 DIAGNOSIS — I509 Heart failure, unspecified: Secondary | ICD-10-CM

## 2013-03-17 DIAGNOSIS — I5022 Chronic systolic (congestive) heart failure: Secondary | ICD-10-CM

## 2013-03-17 DIAGNOSIS — I2589 Other forms of chronic ischemic heart disease: Secondary | ICD-10-CM

## 2013-03-18 DIAGNOSIS — M549 Dorsalgia, unspecified: Secondary | ICD-10-CM | POA: Diagnosis not present

## 2013-03-18 DIAGNOSIS — E1129 Type 2 diabetes mellitus with other diabetic kidney complication: Secondary | ICD-10-CM | POA: Diagnosis not present

## 2013-03-31 LAB — MDC_IDC_ENUM_SESS_TYPE_REMOTE
Brady Statistic AP VP Percent: 1.9 %
Brady Statistic AS VP Percent: 98 %
Brady Statistic AS VS Percent: 0.1 %
Lead Channel Pacing Threshold Amplitude: 0.5 V
Lead Channel Pacing Threshold Amplitude: 1.125 V
Lead Channel Pacing Threshold Pulse Width: 0.4 ms
Lead Channel Sensing Intrinsic Amplitude: 20 mV
Lead Channel Setting Pacing Amplitude: 1.75 V
Lead Channel Setting Pacing Amplitude: 2 V
Lead Channel Setting Pacing Pulse Width: 0.4 ms
Zone Setting Detection Interval: 350 ms

## 2013-04-01 ENCOUNTER — Other Ambulatory Visit: Payer: Self-pay | Admitting: *Deleted

## 2013-04-01 MED ORDER — CLOPIDOGREL BISULFATE 75 MG PO TABS: 75.0000 mg | ORAL_TABLET | Freq: Every day | ORAL | Status: DC

## 2013-04-03 ENCOUNTER — Ambulatory Visit: Payer: Managed Care, Other (non HMO) | Admitting: Cardiology

## 2013-04-09 ENCOUNTER — Encounter: Payer: Self-pay | Admitting: Cardiology

## 2013-04-09 ENCOUNTER — Ambulatory Visit: Payer: Managed Care, Other (non HMO) | Admitting: Cardiology

## 2013-04-09 ENCOUNTER — Ambulatory Visit (INDEPENDENT_AMBULATORY_CARE_PROVIDER_SITE_OTHER): Payer: Managed Care, Other (non HMO) | Admitting: Cardiology

## 2013-04-09 VITALS — BP 120/68 | HR 84 | Ht 68.0 in | Wt 218.8 lb

## 2013-04-09 DIAGNOSIS — I2589 Other forms of chronic ischemic heart disease: Secondary | ICD-10-CM

## 2013-04-09 DIAGNOSIS — I509 Heart failure, unspecified: Secondary | ICD-10-CM

## 2013-04-09 DIAGNOSIS — I2510 Atherosclerotic heart disease of native coronary artery without angina pectoris: Secondary | ICD-10-CM | POA: Diagnosis not present

## 2013-04-09 DIAGNOSIS — I10 Essential (primary) hypertension: Secondary | ICD-10-CM

## 2013-04-09 DIAGNOSIS — I255 Ischemic cardiomyopathy: Secondary | ICD-10-CM

## 2013-04-09 DIAGNOSIS — E785 Hyperlipidemia, unspecified: Secondary | ICD-10-CM | POA: Diagnosis not present

## 2013-04-09 DIAGNOSIS — I5022 Chronic systolic (congestive) heart failure: Secondary | ICD-10-CM

## 2013-04-09 DIAGNOSIS — G4710 Hypersomnia, unspecified: Secondary | ICD-10-CM

## 2013-04-09 NOTE — Patient Instructions (Addendum)
Your physician recommends that you continue on your current medications as directed. Please refer to the Current Medication list given to you today.  Your physician recommends that you return for lab work on 06/10/13 for your fasting NMR and ALT Panel.  Your physician has recommended that you have a sleep study. This test records several body functions during sleep, including: brain activity, eye movement, oxygen and carbon dioxide blood levels, heart rate and rhythm, breathing rate and rhythm, the flow of air through your mouth and nose, snoring, body muscle movements, and chest and belly movement. ( Schedule at the [facility])  Your physician wants you to follow-up in: 6 months with Dr Sherlyn Lick will receive a reminder letter in the mail two months in advance. If you don't receive a letter, please call our office to schedule the follow-up appointment.

## 2013-04-09 NOTE — Progress Notes (Signed)
[address] [place], [place]  [address] Phone: [phone] Fax:  [phone]  Date:  04/09/2013   ID:  [name], [name] September 18, 1940, MRN [id number]  PCP:  Lupita Raider, MD  Cardiologist:  Armanda Magic, MD     History of Present Illness: [name] is a 72 y.o. male with a history of ASCAD, HTN, chronic systolic CHF, dyslipidemia and ischemic DCM.  He denies any anginal chest pain, , LE edema, dizziness, palpitations or syncope.  His wife states that he is snoring and has apneic episodes at night.  He is concerned that he is not sleeping well.  He wakes up feeling tired.  He has chronic DOE which is stable.     Wt Readings from Last 3 Encounters:  04/09/13 218 lb 12.8 oz (99.247 kg)  12/12/12 221 lb 6.4 oz (100.426 kg)  01/22/12 216 lb (97.977 kg)     Past Medical History  Diagnosis Date  . Ischemic cardiomyopathy     s/p ICD Implantation by Dr Amil Amen  . Chronic systolic dysfunction of left ventricle     EF 30%  . Hyperlipemia   . DJD (degenerative joint disease)   . Gout   . HTN (hypertension) 02/14/2011  . Depression   . Angina   . Myocardial infarction 1997  . Cardiac defibrillator in situ   . DM (diabetes mellitus) 02/14/2011  . CAD (coronary artery disease)     s/p CABG 1997, PCI (BMS) of SVG to RCA 3/09, redo CABG 02/2011 with SVG to PDA, SVG to Lcx, s/p cath 2.2013 with ocluded SVT to left circ and patent SVG to RCA  . CHF (congestive heart failure)     EF 30% by cath 05/2011  . ICD (implantable cardiac defibrillator) in place   . GERD (gastroesophageal reflux disease)   . Heart attack   . Complication of anesthesia     ONCE WITH BACK SURGERY DIFFICULT TO WAKE UP    Current Outpatient Prescriptions  Medication Sig Dispense Refill  . allopurinol (ZYLOPRIM) 100 MG tablet Take 100 mg by mouth 2 (two) times daily.       Marland Kitchen aspirin EC 81 MG tablet Take 81 mg by mouth daily.      Marland Kitchen atorvastatin (LIPITOR) 40 MG tablet Take 1 tablet (40 mg  total) by mouth daily.  30 tablet  6  . BAYER CONTOUR TEST test strip As directed      . benazepril (LOTENSIN) 5 MG tablet Take 5 mg by mouth daily.      . carvedilol (COREG) 25 MG tablet Take 25 mg by mouth 2 (two) times daily.       . clopidogrel (PLAVIX) 75 MG tablet Take 1 tablet (75 mg total) by mouth daily.  90 tablet  1  . eplerenone (INSPRA) 25 MG tablet Take 25 mg by mouth daily.       . furosemide (LASIX) 40 MG tablet Take 40 mg by mouth 2 (two) times daily.      Marland Kitchen HYDROcodone-acetaminophen (NORCO/VICODIN) 5-325 MG per tablet Take 1-2 tablets by mouth every 4 (four) hours as needed for pain.  30 tablet  1  . insulin glargine (LANTUS) 100 UNIT/ML injection Inject 35 Units into the skin 2 (two) times daily.       . insulin glulisine (APIDRA) 100 UNIT/ML injection Inject 35-40 Units into the skin 2 (two) times daily. Inject 35 units in the morning and 40 units at night.      Marland Kitchen  MICROLET LANCETS MISC As directed      . niacin (NIASPAN) 500 MG CR tablet Take 1,000 mg by mouth 2 (two) times daily.       . nitroGLYCERIN (NITROSTAT) 0.4 MG SL tablet Place 0.4 mg under the tongue every 5 (five) minutes x 3 doses as needed. For chest pain.      . ranolazine (RANEXA) 500 MG 12 hr tablet Take 1,000 mg by mouth 2 (two) times daily.      . traMADol (ULTRAM) 50 MG tablet Take 50 mg by mouth every 6 (six) hours as needed.      . triamcinolone (KENALOG) 0.1 % cream Apply 1 application topically daily as needed. Apply to chest for rash/dry skin.      Marland Kitchen venlafaxine (EFFEXOR-XR) 150 MG 24 hr capsule Take 150 mg by mouth daily.        Marland Kitchen zolpidem (AMBIEN CR) 12.5 MG CR tablet Take 12.5 mg by mouth at bedtime as needed. For sleep       No current facility-administered medications for this visit.    Allergies:    Allergies  Allergen Reactions  . Codeine Nausea And Vomiting  . Latex Rash    Specifies adhesive     Social History:  The patient  reports that he quit smoking about 44 years ago. His smoking  use included Cigarettes. He has a 7.5 pack-year smoking history. He has never used smokeless tobacco. He reports that he drinks about 0.6 ounces of alcohol per week. He reports that he does not use illicit drugs.   Family History:  The patient's family history includes Coronary artery disease in an other family member; Heart attack in his father and mother; Heart failure in his father and mother.   ROS:  Please see the history of present illness.      All other systems reviewed and negative.   PHYSICAL EXAM: VS:  BP 120/68  Pulse 84  Ht 5\' 8"  (1.727 m)  Wt 218 lb 12.8 oz (99.247 kg)  BMI 33.28 kg/m2 Well nourished, well developed, in no acute distress HEENT: normal Neck: no JVD Cardiac:  normal S1, S2; RRR; no murmur Lungs:  clear to auscultation bilaterally, no wheezing, rhonchi or rales Abd: soft, nontender, no hepatomegaly Ext: no edema Skin: warm and dry Neuro:  CNs 2-12 intact, no focal abnormalities noted  EKG:  NSR with V pacing     ASSESSMENT AND PLAN:  1. ASCAD with no angina  - continue ASA/Plavix/Ranexa 2. HTN - well controlled  - continue Lotensin/carvedilol 3. Dyslipidemia - at goal  - continue atorvastatin  - check NMR and ALT in 05/2013 4. Chronic systolic CHF well compensated  - continue Lasix/beta blocker/ACE I/Inspra 5. Ischemic DCM s/p BiV AICD 6. Daytime sleepiness with snoring  - PSG to rule out sleep apnea   Followup with me in 6 months Signed, Armanda Magic, MD 04/09/2013 8:45 AM

## 2013-04-18 ENCOUNTER — Encounter: Payer: Self-pay | Admitting: *Deleted

## 2013-04-23 DIAGNOSIS — G4710 Hypersomnia, unspecified: Secondary | ICD-10-CM | POA: Diagnosis not present

## 2013-04-25 ENCOUNTER — Other Ambulatory Visit: Payer: Self-pay | Admitting: *Deleted

## 2013-04-25 MED ORDER — RANOLAZINE ER 500 MG PO TB12: 1000.0000 mg | ORAL_TABLET | Freq: Two times a day (BID) | ORAL | Status: DC

## 2013-04-28 ENCOUNTER — Telehealth: Payer: Self-pay | Admitting: Cardiology

## 2013-04-28 NOTE — Telephone Encounter (Signed)
Please let patient know that he has severe OSA and set up for CPAP titration ASAP.  He has significant O2 desaturations down as low as 52% with an AHI of 58/hr and needs a CPAP titration ASAP

## 2013-04-29 DIAGNOSIS — G4710 Hypersomnia, unspecified: Secondary | ICD-10-CM | POA: Diagnosis not present

## 2013-04-29 DIAGNOSIS — G4733 Obstructive sleep apnea (adult) (pediatric): Secondary | ICD-10-CM | POA: Diagnosis not present

## 2013-04-29 NOTE — Telephone Encounter (Signed)
PT is aware. Gave paper work to [place] to call and make sure pt is scheduled ASAP

## 2013-05-01 ENCOUNTER — Encounter: Payer: Self-pay | Admitting: Cardiology

## 2013-05-01 ENCOUNTER — Telehealth: Payer: Self-pay | Admitting: Cardiology

## 2013-05-01 NOTE — Telephone Encounter (Signed)
Please let patient know that he had unsuccessful CPAP titration due to severity of his sleep apnea and set up for BiPAP titration ASAP

## 2013-05-01 NOTE — Telephone Encounter (Signed)
This encounter was created in error - please disregard.

## 2013-05-02 NOTE — Telephone Encounter (Signed)
Pt notified. Form filled for [facility] and it will be faxed. They will call pt to set up BIPAP TItration asap.

## 2013-05-06 DIAGNOSIS — F329 Major depressive disorder, single episode, unspecified: Secondary | ICD-10-CM | POA: Diagnosis not present

## 2013-05-08 ENCOUNTER — Telehealth: Payer: Self-pay | Admitting: Cardiology

## 2013-05-08 NOTE — Telephone Encounter (Signed)
TO Charmaine to advise.

## 2013-05-08 NOTE — Telephone Encounter (Signed)
New message          Tacy Dura is calling to get more information on a sleep study for this pt

## 2013-05-09 DIAGNOSIS — N189 Chronic kidney disease, unspecified: Secondary | ICD-10-CM | POA: Diagnosis not present

## 2013-05-09 DIAGNOSIS — E1129 Type 2 diabetes mellitus with other diabetic kidney complication: Secondary | ICD-10-CM | POA: Diagnosis not present

## 2013-05-14 NOTE — Telephone Encounter (Signed)
Spoke w/Shaneese F. [phone], Allie Dimmer, obtained the information she needed to approve the titration.  HKVQ#[id number].

## 2013-05-16 DIAGNOSIS — I959 Hypotension, unspecified: Secondary | ICD-10-CM | POA: Diagnosis not present

## 2013-05-16 DIAGNOSIS — F329 Major depressive disorder, single episode, unspecified: Secondary | ICD-10-CM | POA: Diagnosis not present

## 2013-05-19 DIAGNOSIS — G4733 Obstructive sleep apnea (adult) (pediatric): Secondary | ICD-10-CM | POA: Diagnosis not present

## 2013-05-26 ENCOUNTER — Telehealth: Payer: Self-pay | Admitting: Cardiology

## 2013-05-26 NOTE — Telephone Encounter (Signed)
Pt is aware. Form filled out for pt and brought to [place] to Fax over to [facility].

## 2013-05-26 NOTE — Telephone Encounter (Signed)
Please let patient know that he could not be adequately titrated on CPAP.  Please set up for in lab BiPAP titration.

## 2013-05-26 NOTE — Telephone Encounter (Signed)
Please let patient know that he did not have successful CPAP titration and set up for BiPAP titration

## 2013-06-10 ENCOUNTER — Other Ambulatory Visit (INDEPENDENT_AMBULATORY_CARE_PROVIDER_SITE_OTHER): Payer: Managed Care, Other (non HMO)

## 2013-06-10 DIAGNOSIS — E785 Hyperlipidemia, unspecified: Secondary | ICD-10-CM | POA: Diagnosis not present

## 2013-06-10 LAB — ALT: ALT: 22 U/L (ref 0–53)

## 2013-06-11 LAB — NMR LIPOPROFILE WITH LIPIDS
Cholesterol, Total: 97 mg/dL (ref <200)
HDL PARTICLE NUMBER: 23.9 umol/L — AB (ref >=30.5)
HDL Size: 8.8 nm — ABNORMAL LOW (ref >=9.2)
HDL-C: 28 mg/dL — ABNORMAL LOW (ref >=40)
LARGE VLDL-P: 11.8 nmol/L — AB (ref <=2.7)
LDL CALC: 25 mg/dL (ref <100)
LDL PARTICLE NUMBER: 701 nmol/L (ref <1000)
LDL SIZE: 19.6 nm — AB (ref >20.5)
LP-IR SCORE: 72 — AB (ref <=45)
Large HDL-P: 2.6 umol/L — ABNORMAL LOW (ref >=4.8)
SMALL LDL PARTICLE NUMBER: 599 nmol/L — AB (ref <=527)
Triglycerides: 221 mg/dL — ABNORMAL HIGH (ref <150)
VLDL Size: 51.6 nm — ABNORMAL HIGH (ref <=46.6)

## 2013-06-17 DIAGNOSIS — I428 Other cardiomyopathies: Secondary | ICD-10-CM | POA: Diagnosis not present

## 2013-06-17 DIAGNOSIS — I509 Heart failure, unspecified: Secondary | ICD-10-CM

## 2013-06-17 DIAGNOSIS — I5022 Chronic systolic (congestive) heart failure: Secondary | ICD-10-CM

## 2013-06-18 ENCOUNTER — Ambulatory Visit (INDEPENDENT_AMBULATORY_CARE_PROVIDER_SITE_OTHER): Payer: Managed Care, Other (non HMO) | Admitting: *Deleted

## 2013-06-18 ENCOUNTER — Other Ambulatory Visit: Payer: Self-pay | Admitting: General Surgery

## 2013-06-18 ENCOUNTER — Encounter: Payer: Self-pay | Admitting: General Surgery

## 2013-06-18 DIAGNOSIS — E785 Hyperlipidemia, unspecified: Secondary | ICD-10-CM

## 2013-06-18 DIAGNOSIS — I428 Other cardiomyopathies: Secondary | ICD-10-CM

## 2013-06-18 DIAGNOSIS — I5022 Chronic systolic (congestive) heart failure: Secondary | ICD-10-CM

## 2013-06-20 ENCOUNTER — Encounter: Payer: Self-pay | Admitting: Cardiology

## 2013-06-22 LAB — MDC_IDC_ENUM_SESS_TYPE_REMOTE
Battery Voltage: 3.14 V
Brady Statistic AP VP Percent: 0.58 %
Brady Statistic RA Percent Paced: 0.59 %
Brady Statistic RV Percent Paced: 99.67 %
Date Time Interrogation Session: 20150303160347
HIGH POWER IMPEDANCE MEASURED VALUE: 513 Ohm
HIGH POWER IMPEDANCE MEASURED VALUE: 57 Ohm
HighPow Impedance: 209 Ohm
HighPow Impedance: 46 Ohm
Lead Channel Impedance Value: 475 Ohm
Lead Channel Pacing Threshold Amplitude: 0.625 V
Lead Channel Pacing Threshold Amplitude: 1.125 V
Lead Channel Pacing Threshold Pulse Width: 0.4 ms
Lead Channel Pacing Threshold Pulse Width: 0.4 ms
Lead Channel Setting Pacing Amplitude: 2 V
Lead Channel Setting Pacing Amplitude: 2.5 V
Lead Channel Setting Pacing Pulse Width: 0.4 ms
Lead Channel Setting Pacing Pulse Width: 0.4 ms
MDC IDC MSMT LEADCHNL LV IMPEDANCE VALUE: 418 Ohm
MDC IDC MSMT LEADCHNL LV IMPEDANCE VALUE: 418 Ohm
MDC IDC MSMT LEADCHNL LV IMPEDANCE VALUE: 684 Ohm
MDC IDC MSMT LEADCHNL LV PACING THRESHOLD AMPLITUDE: 0.875 V
MDC IDC MSMT LEADCHNL RA PACING THRESHOLD PULSEWIDTH: 0.4 ms
MDC IDC MSMT LEADCHNL RA SENSING INTR AMPL: 1.875 mV
MDC IDC MSMT LEADCHNL RV IMPEDANCE VALUE: 627 Ohm
MDC IDC MSMT LEADCHNL RV SENSING INTR AMPL: 31.625 mV
MDC IDC SET LEADCHNL RA PACING AMPLITUDE: 2 V
MDC IDC SET LEADCHNL RV SENSING SENSITIVITY: 0.3 mV
MDC IDC SET ZONE DETECTION INTERVAL: 350 ms
MDC IDC STAT BRADY AP VS PERCENT: 0.01 %
MDC IDC STAT BRADY AS VP PERCENT: 99.09 %
MDC IDC STAT BRADY AS VS PERCENT: 0.32 %
Zone Setting Detection Interval: 300 ms
Zone Setting Detection Interval: 350 ms
Zone Setting Detection Interval: 450 ms

## 2013-06-24 DIAGNOSIS — E1129 Type 2 diabetes mellitus with other diabetic kidney complication: Secondary | ICD-10-CM | POA: Diagnosis not present

## 2013-06-24 DIAGNOSIS — N189 Chronic kidney disease, unspecified: Secondary | ICD-10-CM | POA: Diagnosis not present

## 2013-06-24 DIAGNOSIS — E1165 Type 2 diabetes mellitus with hyperglycemia: Secondary | ICD-10-CM | POA: Diagnosis not present

## 2013-07-16 ENCOUNTER — Encounter: Payer: Self-pay | Admitting: *Deleted

## 2013-07-25 ENCOUNTER — Encounter: Payer: Self-pay | Admitting: Internal Medicine

## 2013-08-05 ENCOUNTER — Ambulatory Visit (INDEPENDENT_AMBULATORY_CARE_PROVIDER_SITE_OTHER): Payer: Managed Care, Other (non HMO) | Admitting: Cardiology

## 2013-08-05 ENCOUNTER — Encounter: Payer: Self-pay | Admitting: Cardiology

## 2013-08-05 ENCOUNTER — Encounter (INDEPENDENT_AMBULATORY_CARE_PROVIDER_SITE_OTHER): Payer: Self-pay

## 2013-08-05 VITALS — BP 130/72 | HR 77 | Ht 68.0 in | Wt 216.0 lb

## 2013-08-05 DIAGNOSIS — I2589 Other forms of chronic ischemic heart disease: Secondary | ICD-10-CM

## 2013-08-05 DIAGNOSIS — E785 Hyperlipidemia, unspecified: Secondary | ICD-10-CM | POA: Diagnosis not present

## 2013-08-05 DIAGNOSIS — R079 Chest pain, unspecified: Secondary | ICD-10-CM

## 2013-08-05 DIAGNOSIS — I10 Essential (primary) hypertension: Secondary | ICD-10-CM

## 2013-08-05 DIAGNOSIS — I5022 Chronic systolic (congestive) heart failure: Secondary | ICD-10-CM

## 2013-08-05 DIAGNOSIS — I2510 Atherosclerotic heart disease of native coronary artery without angina pectoris: Secondary | ICD-10-CM

## 2013-08-05 DIAGNOSIS — I509 Heart failure, unspecified: Secondary | ICD-10-CM

## 2013-08-05 DIAGNOSIS — I255 Ischemic cardiomyopathy: Secondary | ICD-10-CM

## 2013-08-05 NOTE — Patient Instructions (Signed)
Your physician has requested that you have a lexiscan myoview. For further information please visit [email or web address]. Please follow instruction sheet, as given.  Your physician recommends that you schedule a follow-up appointment with Dr. Radford Pax 1 weeks after [facility].

## 2013-08-05 NOTE — Progress Notes (Signed)
[address] , [place]  [address] Phone: [phone] Fax:  [phone]  Date:  08/05/2013   ID:  Aking K Tester, DOB 06/21/1940, MRN [id number]  PCP:  SHAW,KIMBERLEE, MD  Cardiologist:  Sheng Pritz, MD  El   History of Present Illness: [name] is a 73 y.o. male with a history of ASCAD s/p CABG in 1997 with cath in 2012 showing occluded SVG to diagonal and 80% stenosis of the SVG to distal RCA and occluded SVG yto OM and then underwent redo CABG with SVG to PDA and SVG to left circ, HTN, chronic systolic CHF, dyslipidemia and ischemic DCM. He presents today because he has not been feeling well.  He says that he has been having chest pain in the mid portion of his chest.  He starts to work outside and gets tightness in his chest after working about 20 minutes and then it resolves after he rests. There is no radiation of the pain and it is not associated with SOB, nausea or diaphoresis.  This is different from what he felt with his angina in the past which was a burning.   He denies any SOB or DOE.  He is also feeling tired with exertion.  He denies any LE edema, palpitations or syncope. His wife states that he is snoring and has apneic episodes at night. He is concerned that he is not sleeping well. He wakes up feeling tired. He has chronic DOE which is stable. He occasionally has some dizzy spells when sitting in his chair.  Occasionally his BP runs low with SBP in the 80's.    Wt Readings from Last 3 Encounters:  08/05/13 216 lb (97.977 kg)  04/09/13 218 lb 12.8 oz (99.247 kg)  12/12/12 221 lb 6.4 oz (100.426 kg)     Past Medical History  Diagnosis Date  . Ischemic cardiomyopathy     s/p ICD Implantation by Dr Edmunds  . Chronic systolic dysfunction of left ventricle     EF 30%  . Hyperlipemia   . DJD (degenerative joint disease)   . Gout   . HTN (hypertension) 02/14/2011  . Depression   . Angina   . Myocardial infarction 1997  . Cardiac defibrillator  in situ   . DM (diabetes mellitus) 02/14/2011  . CAD (coronary artery disease)     s/p CABG 1997, PCI (BMS) of SVG to RCA 3/09, redo CABG 02/2011 with SVG to PDA, SVG to Lcx, s/p cath 2.2013 with ocluded SVT to left circ and patent SVG to RCA  . CHF (congestive heart failure)     EF 30% by cath 05/2011  . ICD (implantable cardiac defibrillator) in place   . GERD (gastroesophageal reflux disease)   . Heart attack   . Complication of anesthesia     ONCE WITH BACK SURGERY DIFFICULT TO WAKE UP    Current Outpatient Prescriptions  Medication Sig Dispense Refill  . allopurinol (ZYLOPRIM) 100 MG tablet Take 100 mg by mouth 2 (two) times daily.       . aspirin EC 81 MG tablet Take 81 mg by mouth daily.      . atorvastatin (LIPITOR) 40 MG tablet Take 1 tablet (40 mg total) by mouth daily.  30 tablet  6  . BAYER CONTOUR TEST test strip As directed      . benazepril (LOTENSIN) 5 MG tablet Take 5 mg by mouth daily.      . carvedilol (COREG) 25 MG tablet   Take 25 mg by mouth 2 (two) times daily.       . clopidogrel (PLAVIX) 75 MG tablet Take 1 tablet (75 mg total) by mouth daily.  90 tablet  1  . eplerenone (INSPRA) 25 MG tablet Take 25 mg by mouth daily.       . furosemide (LASIX) 40 MG tablet Take 40 mg by mouth 2 (two) times daily.      . HYDROcodone-acetaminophen (NORCO/VICODIN) 5-325 MG per tablet Take 1-2 tablets by mouth every 4 (four) hours as needed for pain.  30 tablet  1  . insulin glargine (LANTUS) 100 UNIT/ML injection Inject 35 Units into the skin 2 (two) times daily.       . insulin glulisine (APIDRA) 100 UNIT/ML injection Inject 35-40 Units into the skin 2 (two) times daily. Inject 35 units in the morning and 40 units at night.      . MICROLET LANCETS MISC As directed      . niacin (NIASPAN) 500 MG CR tablet Take 1,000 mg by mouth 2 (two) times daily.       . nitroGLYCERIN (NITROSTAT) 0.4 MG SL tablet Place 0.4 mg under the tongue every 5 (five) minutes x 3 doses as needed. For chest  pain.      . ranolazine (RANEXA) 500 MG 12 hr tablet Take 2 tablets (1,000 mg total) by mouth 2 (two) times daily.  360 tablet  1  . sertraline (ZOLOFT) 50 MG tablet       . traMADol (ULTRAM) 50 MG tablet Take 50 mg by mouth every 6 (six) hours as needed.      . triamcinolone (KENALOG) 0.1 % cream Apply 1 application topically daily as needed. Apply to chest for rash/dry skin.      . venlafaxine (EFFEXOR-XR) 150 MG 24 hr capsule Take 150 mg by mouth daily.        . zolpidem (AMBIEN CR) 12.5 MG CR tablet Take 12.5 mg by mouth at bedtime as needed. For sleep       No current facility-administered medications for this visit.    Allergies:    Allergies  Allergen Reactions  . Codeine Nausea And Vomiting  . Latex Rash    Specifies adhesive     Social History:  The patient  reports that he quit smoking about 44 years ago. His smoking use included Cigarettes. He has a 7.5 pack-year smoking history. He has never used smokeless tobacco. He reports that he drinks about .6 ounces of alcohol per week. He reports that he does not use illicit drugs.   Family History:  The patient's family history includes Coronary artery disease in an other family member; Heart attack in his father and mother; Heart failure in his father and mother.   ROS:  Please see the history of present illness.      All other systems reviewed and negative.   PHYSICAL EXAM: VS:  BP 130/72  Pulse 77  Ht 5' 8" (1.727 m)  Wt 216 lb (97.977 kg)  BMI 32.85 kg/m2 Well nourished, well developed, in no acute distress HEENT: normal Neck: no JVD Cardiac:  normal S1, S2; RRR; no murmur Lungs:  clear to auscultation bilaterally, no wheezing, rhonchi or rales Abd: soft, nontender, no hepatomegaly Ext: no edema Skin: warm and dry Neuro:  CNs 2-12 intact, no focal abnormalities noted  EKG:     NSR with V paced rhytm  ASSESSMENT AND PLAN:  1.  ASCAD with chest discomfort that he has   a hard time describing but is not like what his  anginal chest pain has been in the past.  There are no associated symptoms and EKG shows NSR with V paced rhythm - I will get a Lexiscan myoview to rule out ischemia from graft failure - continue ASA/Plavix/Ranexa  2.  HTN - well controlled - continue Lotensin/carvedilol  3.  Dyslipidemia - at goal - continue atorvastatin  4.  Chronic systolic CHF well compensated - continue Lasix/beta blocker/ACE I/Inspra  5.  Ischemic DCM s/p BiV AICD 6.  Intermittent orthostasis with SBP as low as 48mmHg - decrease Lasix to 40mg  daily  Followup with me 1 week after stress test complete   Signed, Fransico Him, MD 08/05/2013 1:26 PM

## 2013-08-06 ENCOUNTER — Ambulatory Visit (HOSPITAL_COMMUNITY): Payer: Managed Care, Other (non HMO) | Attending: Cardiology | Admitting: Radiology

## 2013-08-06 VITALS — BP 115/61 | Ht 68.0 in | Wt 209.0 lb

## 2013-08-06 DIAGNOSIS — R0609 Other forms of dyspnea: Secondary | ICD-10-CM

## 2013-08-06 DIAGNOSIS — R0989 Other specified symptoms and signs involving the circulatory and respiratory systems: Secondary | ICD-10-CM

## 2013-08-06 DIAGNOSIS — R079 Chest pain, unspecified: Secondary | ICD-10-CM | POA: Insufficient documentation

## 2013-08-06 DIAGNOSIS — I2510 Atherosclerotic heart disease of native coronary artery without angina pectoris: Secondary | ICD-10-CM | POA: Insufficient documentation

## 2013-08-06 DIAGNOSIS — R0789 Other chest pain: Secondary | ICD-10-CM

## 2013-08-06 MED ORDER — TECHNETIUM TC 99M SESTAMIBI GENERIC - CARDIOLITE
11.0000 | Freq: Once | INTRAVENOUS | Status: AC | PRN
  Administered 2013-08-06: 11 via INTRAVENOUS

## 2013-08-06 MED ORDER — TECHNETIUM TC 99M SESTAMIBI GENERIC - CARDIOLITE
33.0000 | Freq: Once | INTRAVENOUS | Status: AC | PRN
  Administered 2013-08-06: 33 via INTRAVENOUS

## 2013-08-06 MED ORDER — REGADENOSON 0.4 MG/5ML IV SOLN
0.4000 mg | Freq: Once | INTRAVENOUS | Status: AC
  Administered 2013-08-06: 0.4 mg via INTRAVENOUS

## 2013-08-06 NOTE — Progress Notes (Signed)
[facility] [address] [place], [place] [address] [phone]    Cardiology Nuclear Med Study  [name] is a 73 y.o. male     MRN : [id number]     DOB: June 08, 1940  Procedure Date: 08/06/2013  Nuclear Med Background Indication for Stress Test:  Evaluation for Ischemia, Graft Patency and Stent Patency History:  01/26/11 MPI: EF: 27% Ischemia/Scar, CAD-MI-Re-Do CABG STENT AICD, ICM, CHF, '97 CABG Cardiac Risk Factors: Family History - CAD, History of Smoking, Hypertension, IDDM, LBBB and Lipids  Symptoms:  Chest Tightnes, Dizziness-Syncope   Nuclear Pre-Procedure Caffeine/Decaff Intake:  None > 12 hrs NPO After: 8:30pm   Lungs:  clear O2 Sat: 96% on room air. IV 0.9% NS with Angio Cath:  22g  IV Site: R Antecubital x 1, tolerated well IV Started by:  Irven Baltimore, RN  Chest Size (in):  44 Cup Size: n/a  Height: 5\' 8"  (1.727 m)  Weight:  209 lb (94.802 kg)  BMI:  Body mass index is 31.79 kg/(m^2). Tech Comments:  1/2 dose Lantus Insulin last night, no insulin today. Fasting CBG was 205 at 0905 on arrival. Patient took all morning medications( Coreg) except lasix. Irven Baltimore, RN     Nuclear Med Study 1 or 2 day study: 1 day  Stress Test Type:  Carlton Adam  Reading MD: N/A  Order Authorizing Provider:  Fransico Him, MD  Resting Radionuclide: Technetium 47m Sestamibi  Resting Radionuclide Dose: 11.0 mCi   Stress Radionuclide:  Technetium 25m Sestamibi  Stress Radionuclide Dose: 33.0 mCi           Stress Protocol Rest HR: 75 Stress HR: 81  Rest BP: 115/61 Stress BP: 110/54  Exercise Time (min): n/a METS: n/a   Predicted Max HR: 150 bpm % Max HR: 54 bpm Rate Pressure Product: 9315   Dose of Adenosine (mg):  n/a Dose of Lexiscan: 0.4 mg  Dose of Atropine (mg): n/a Dose of Dobutamine: n/a mcg/kg/min (at max HR)  Stress Test Technologist: Perrin Maltese, EMT-P  Nuclear Technologist:  Annye Rusk, CNMT     Rest Procedure:  Myocardial  perfusion imaging was performed at rest 45 minutes following the intravenous administration of Technetium 51m Sestamibi. Rest ECG: The rhythm is paced  Stress Procedure:  The patient received IV Lexiscan 0.4 mg over 15-seconds.  Technetium 35m Sestamibi injected at 30-seconds. This patient had sob and nausea with the Lexiscan injection. Quantitative spect images were obtained after a 45 minute delay. Stress ECG: No significant change from baseline ECG  QPS Raw Data Images:  Normal; no motion artifact; normal heart/lung ratio. Stress Images:  There is a very large area with decreased uptake that varies from moderate to severe. This affects the base inferoseptal segment, base/mid/apical inferior segments, base/mid inferolateral segments, base/mid anterolateral segments, apical lateral segment, mid/apical anterior segments, and the apical cap. This area has both fixed and reversible segments. Rest Images:  There is a very large area of decreased uptake that varies from moderate to severe. This affects the base inferoseptal segment base/min/apical inferior segments, base/mid inferolateral segments, base/mid anterolateral segments, apical lateral segment, and the apical cap.  Subtraction (SDS):  There is moderate ischemia in the mid/apical anterior segments, mid anterolateral segment, and apical lateral segment. Transient Ischemic Dilatation (Normal <1.22):  1.05 Lung/Heart Ratio (Normal <0.45):  0.38  Quantitative Gated Spect Images QGS EDV:  225 ml QGS ESV:  181 ml  Impression Exercise Capacity:  Lexiscan with no exercise.  BP Response:  Normal blood pressure response. Clinical Symptoms:  Shortness of breath ECG Impression:  No significant ST segment change suggestive of ischemia. Comparison with Prior Nuclear Study:   The report of the study from October, 2012, reveals evidence of an old large infarct. Ejection fraction at that time was 27%. There is mention of some ischemia in the base/anterior  segments. I suspect that the current findings are similar to the prior study. But  I am not able to compare the actual images. There is a large area of scar. There is mild/moderate ischemia in the mid/apical anterior segments, the lateral apical segment, and the mid anterolateral segment.  Overall Impression:  This is a high risk scan. There is significant left ventricular dysfunction. There is a large area of scar. There is mild/moderate ischemia in the mid/apical anterior segments, the lateral apical segment, and the mid anterolateral segment. I have outlined above how these findings relate to the prior study.  LV Ejection Fraction: 20%.  LV Wall Motion:  The inferior wall is akinetic. The septum moves relatively well. The other walls are hypokinetic.  Carlena Bjornstad, MD

## 2013-08-07 ENCOUNTER — Telehealth: Payer: Self-pay | Admitting: Cardiology

## 2013-08-07 ENCOUNTER — Encounter (HOSPITAL_COMMUNITY): Payer: Self-pay

## 2013-08-07 ENCOUNTER — Encounter: Payer: Self-pay | Admitting: General Surgery

## 2013-08-07 ENCOUNTER — Other Ambulatory Visit: Payer: Self-pay | Admitting: General Surgery

## 2013-08-07 ENCOUNTER — Other Ambulatory Visit (INDEPENDENT_AMBULATORY_CARE_PROVIDER_SITE_OTHER): Payer: Managed Care, Other (non HMO)

## 2013-08-07 DIAGNOSIS — R079 Chest pain, unspecified: Secondary | ICD-10-CM | POA: Diagnosis not present

## 2013-08-07 LAB — CBC WITH DIFFERENTIAL/PLATELET
BASOS ABS: 0 10*3/uL (ref 0.0–0.1)
Basophils Relative: 0.2 % (ref 0.0–3.0)
EOS ABS: 0.1 10*3/uL (ref 0.0–0.7)
Eosinophils Relative: 1.3 % (ref 0.0–5.0)
HCT: 43.2 % (ref 39.0–52.0)
Hemoglobin: 14.5 g/dL (ref 13.0–17.0)
Lymphocytes Relative: 15.5 % (ref 12.0–46.0)
Lymphs Abs: 1.3 10*3/uL (ref 0.7–4.0)
MCHC: 33.5 g/dL (ref 30.0–36.0)
MCV: 94.4 fl (ref 78.0–100.0)
MONO ABS: 0.8 10*3/uL (ref 0.1–1.0)
Monocytes Relative: 9.3 % (ref 3.0–12.0)
NEUTROS PCT: 73.7 % (ref 43.0–77.0)
Neutro Abs: 6.2 10*3/uL (ref 1.4–7.7)
Platelets: 234 10*3/uL (ref 150.0–400.0)
RBC: 4.57 Mil/uL (ref 4.22–5.81)
RDW: 14.5 % (ref 11.5–14.6)
WBC: 8.4 10*3/uL (ref 4.5–10.5)

## 2013-08-07 LAB — BASIC METABOLIC PANEL
BUN: 27 mg/dL — ABNORMAL HIGH (ref 6–23)
CHLORIDE: 100 meq/L (ref 96–112)
CO2: 25 mEq/L (ref 19–32)
Calcium: 9.1 mg/dL (ref 8.4–10.5)
Creatinine, Ser: 1.3 mg/dL (ref 0.4–1.5)
GFR: 58.9 mL/min — AB (ref >60.00)
Glucose, Bld: 315 mg/dL — ABNORMAL HIGH (ref 70–99)
Potassium: 4.3 mEq/L (ref 3.5–5.1)
Sodium: 135 mEq/L (ref 135–145)

## 2013-08-07 LAB — PROTIME-INR
INR: 1.2 ratio — ABNORMAL HIGH (ref 0.8–1.0)
PROTHROMBIN TIME: 13.5 s — AB (ref 9.6–13.1)

## 2013-08-07 NOTE — Telephone Encounter (Signed)
Spoke to pt already in office.

## 2013-08-07 NOTE — Telephone Encounter (Signed)
New message ° ° ° ° ° ° ° ° ° °Pt returning nurses call °

## 2013-08-08 ENCOUNTER — Ambulatory Visit (HOSPITAL_COMMUNITY)
Admission: RE | Admit: 2013-08-08 | Discharge: 2013-08-08 | Disposition: A | Discharge status: Home / Self Care | Payer: Managed Care, Other (non HMO) | Source: Ambulatory Visit | Attending: Interventional Cardiology | Admitting: Interventional Cardiology

## 2013-08-08 ENCOUNTER — Encounter (HOSPITAL_COMMUNITY)
Admission: RE | Disposition: A | Discharge status: Home / Self Care | Payer: Self-pay | Source: Ambulatory Visit | Attending: Interventional Cardiology

## 2013-08-08 DIAGNOSIS — M109 Gout, unspecified: Secondary | ICD-10-CM | POA: Insufficient documentation

## 2013-08-08 DIAGNOSIS — I252 Old myocardial infarction: Secondary | ICD-10-CM | POA: Insufficient documentation

## 2013-08-08 DIAGNOSIS — I25810 Atherosclerosis of coronary artery bypass graft(s) without angina pectoris: Secondary | ICD-10-CM | POA: Insufficient documentation

## 2013-08-08 DIAGNOSIS — Z87891 Personal history of nicotine dependence: Secondary | ICD-10-CM | POA: Insufficient documentation

## 2013-08-08 DIAGNOSIS — I509 Heart failure, unspecified: Secondary | ICD-10-CM | POA: Insufficient documentation

## 2013-08-08 DIAGNOSIS — F329 Major depressive disorder, single episode, unspecified: Secondary | ICD-10-CM | POA: Diagnosis not present

## 2013-08-08 DIAGNOSIS — M1990 Unspecified osteoarthritis, unspecified site: Secondary | ICD-10-CM | POA: Diagnosis not present

## 2013-08-08 DIAGNOSIS — I10 Essential (primary) hypertension: Secondary | ICD-10-CM | POA: Insufficient documentation

## 2013-08-08 DIAGNOSIS — Z7902 Long term (current) use of antithrombotics/antiplatelets: Secondary | ICD-10-CM | POA: Insufficient documentation

## 2013-08-08 DIAGNOSIS — E785 Hyperlipidemia, unspecified: Secondary | ICD-10-CM | POA: Insufficient documentation

## 2013-08-08 DIAGNOSIS — I2510 Atherosclerotic heart disease of native coronary artery without angina pectoris: Secondary | ICD-10-CM | POA: Diagnosis not present

## 2013-08-08 DIAGNOSIS — E119 Type 2 diabetes mellitus without complications: Secondary | ICD-10-CM | POA: Insufficient documentation

## 2013-08-08 DIAGNOSIS — F3289 Other specified depressive episodes: Secondary | ICD-10-CM | POA: Diagnosis not present

## 2013-08-08 DIAGNOSIS — I2582 Chronic total occlusion of coronary artery: Secondary | ICD-10-CM | POA: Insufficient documentation

## 2013-08-08 DIAGNOSIS — Z7982 Long term (current) use of aspirin: Secondary | ICD-10-CM | POA: Diagnosis not present

## 2013-08-08 DIAGNOSIS — I2589 Other forms of chronic ischemic heart disease: Secondary | ICD-10-CM | POA: Insufficient documentation

## 2013-08-08 DIAGNOSIS — I5022 Chronic systolic (congestive) heart failure: Secondary | ICD-10-CM | POA: Diagnosis not present

## 2013-08-08 DIAGNOSIS — K219 Gastro-esophageal reflux disease without esophagitis: Secondary | ICD-10-CM | POA: Insufficient documentation

## 2013-08-08 DIAGNOSIS — R931 Abnormal findings on diagnostic imaging of heart and coronary circulation: Secondary | ICD-10-CM

## 2013-08-08 DIAGNOSIS — Z95810 Presence of automatic (implantable) cardiac defibrillator: Secondary | ICD-10-CM | POA: Insufficient documentation

## 2013-08-08 DIAGNOSIS — Z794 Long term (current) use of insulin: Secondary | ICD-10-CM | POA: Insufficient documentation

## 2013-08-08 HISTORY — PX: LEFT HEART CATHETERIZATION WITH CORONARY/GRAFT ANGIOGRAM: SHX5450

## 2013-08-08 LAB — GLUCOSE, CAPILLARY
GLUCOSE-CAPILLARY: 193 mg/dL — AB (ref 70–99)
Glucose-Capillary: 200 mg/dL — ABNORMAL HIGH (ref 70–99)

## 2013-08-08 SURGERY — LEFT HEART CATHETERIZATION WITH CORONARY/GRAFT ANGIOGRAM
Anesthesia: LOCAL

## 2013-08-08 MED ORDER — HYDROMORPHONE HCL PF 1 MG/ML IJ SOLN
INTRAMUSCULAR | Status: AC
  Filled 2013-08-08: qty 1

## 2013-08-08 MED ORDER — FENTANYL CITRATE 0.05 MG/ML IJ SOLN
INTRAMUSCULAR | Status: AC
  Filled 2013-08-08: qty 2

## 2013-08-08 MED ORDER — SODIUM CHLORIDE 0.9 % IJ SOLN: 3.0000 mL | Freq: Two times a day (BID) | INTRAMUSCULAR | Status: DC

## 2013-08-08 MED ORDER — LIDOCAINE HCL (PF) 1 % IJ SOLN
INTRAMUSCULAR | Status: AC
  Filled 2013-08-08: qty 30

## 2013-08-08 MED ORDER — SODIUM CHLORIDE 0.9 % IJ SOLN: 3.0000 mL | INTRAMUSCULAR | Status: DC | PRN

## 2013-08-08 MED ORDER — HEPARIN (PORCINE) IN NACL 2-0.9 UNIT/ML-% IJ SOLN
INTRAMUSCULAR | Status: AC
  Filled 2013-08-08: qty 1000

## 2013-08-08 MED ORDER — HEPARIN SODIUM (PORCINE) 1000 UNIT/ML IJ SOLN
INTRAMUSCULAR | Status: AC
  Filled 2013-08-08: qty 1

## 2013-08-08 MED ORDER — SODIUM CHLORIDE 0.9 % IV SOLN
INTRAVENOUS | Status: DC
  Administered 2013-08-08: 09:00:00 via INTRAVENOUS

## 2013-08-08 MED ORDER — VERAPAMIL HCL 2.5 MG/ML IV SOLN
INTRAVENOUS | Status: AC
  Filled 2013-08-08: qty 2

## 2013-08-08 MED ORDER — ASPIRIN 81 MG PO CHEW
81.0000 mg | CHEWABLE_TABLET | ORAL | Status: AC
  Administered 2013-08-08: 81 mg via ORAL
  Filled 2013-08-08: qty 1

## 2013-08-08 MED ORDER — MIDAZOLAM HCL 2 MG/2ML IJ SOLN
INTRAMUSCULAR | Status: AC
  Filled 2013-08-08: qty 2

## 2013-08-08 MED ORDER — SODIUM CHLORIDE 0.9 % IV SOLN: 250.0000 mL | INTRAVENOUS | Status: DC | PRN

## 2013-08-08 NOTE — CV Procedure (Signed)
     Left Heart Catheterization with Coronary and Bypass Angiography Report  [name]  73 y.o.  male 1940-05-05  Procedure Date: 08/08/2013 Referring Physician: Fransico Him, M.D. Primary Cardiologist:: Fransico Him, M.D.  INDICATIONS: High risk of myocardial perfusion study. Symptoms include fatigue, exertional dyspnea, and physical limitations. He has not had angina.  PROCEDURE: 1. Left heart catheterization; 2. Left ventricular pressure recording; 3. Coronary angiography; 4. Vein graft angiography; 5. Left internal mammary angiography  CONSENT:  The risks, benefits, and details of the procedure were explained in detail to the patient. Risks including death, stroke, heart attack, kidney injury, allergy, limb ischemia, bleeding and radiation injury were discussed.  The patient verbalized understanding and wanted to proceed.  Informed written consent was obtained.  PROCEDURE TECHNIQUE:  After Xylocaine anesthesia a 5 French Slender sheath was placed in the left radial artery with an angiocath and the modified Seldinger technique.  Coronary angiography was done using a 5 F JR 4, and JL diagnostic catheters.  Left ventriculography was not done blood pressures were measured using the JR 4 catheter and hand injection.    CONTRAST:  Total of 100 cc.  COMPLICATIONS:  None   HEMODYNAMICS:  Aortic pressure 110/56 mmHg; LV pressure 114/12 mmHg; LVEDP 17 mm mercury  ANGIOGRAPHIC DATA:   The left main coronary artery is distal 80% left main, stable since 2011.  The left anterior descending artery is totally occluded.  The left circumflex artery is patent with 50% mid vessel narrowing followed by a patent obtuse marginal that has been the site of 2 previous bypass graft.  The right coronary artery is totally occluded ostially.  BYPASS GRAFT ANGIOGRAPHY: The saphenous vein graft (1997 and 2012) to the right coronary is totally occluded  The saphenous vein graft (1997 and 2012) to the  obtuse marginal is totally occluded  The LIMA to the LAD is widely patent   LEFT VENTRICULOGRAM:  Left ventricular angiogram was not done. LV pressures were recorded and were not significantly elevated.    IMPRESSIONS:  1. Ischemic cardiomyopathy with LVEF less than 20%, with progressive LV dysfunction related to bypass graft failure.  2. Occlusion of the saphenous vein grafts placed in 2012 to the right coronary and to the circumflex  3. Widely patent LIMA to the LAD  4. Totally occluded native right coronary, proximal LAD, and 80% distal left main (unchanged since 2011)   RECOMMENDATION:  Difficult situation in a patient with severe LV dysfunction and no real complaints of angina. He does have lateral wall ischemia suggesting that the marginal territory supplied via the left main is a cause of the lateral wall ischemia noted on nuclear testing. PCI of the distal left main would be difficult because of heavy calcification, bifurcation anatomy, and a right angle origin of circumflex from the left main. If PCI is considered, the approach should be femoral and will likely need hemodynamic support with either a balloon pump or Impella.  Immediate plan today is continue medical therapy and followup with Dr. Radford Pax. I will discuss the situation with her.

## 2013-08-08 NOTE — H&P (View-Only) (Signed)
[address], [place], [place]  [address] Phone: [phone] Fax:  [phone]  Date:  08/05/2013   ID:  [name], DOB [birth year], MRN [id number]  PCP:  Mayra Neer, MD  Cardiologist:  Fransico Him, MD  El   History of Present Illness: [name] is a 73 y.o. male with a history of ASCAD s/p CABG in 1997 with cath in 2012 showing occluded SVG to diagonal and 80% stenosis of the SVG to distal RCA and occluded SVG yto OM and then underwent redo CABG with SVG to PDA and SVG to left circ, HTN, chronic systolic CHF, dyslipidemia and ischemic DCM. He presents today because he has not been feeling well.  He says that he has been having chest pain in the mid portion of his chest.  He starts to work outside and gets tightness in his chest after working about 20 minutes and then it resolves after he rests. There is no radiation of the pain and it is not associated with SOB, nausea or diaphoresis.  This is different from what he felt with his angina in the past which was a burning.   He denies any SOB or DOE.  He is also feeling tired with exertion.  He denies any LE edema, palpitations or syncope. His wife states that he is snoring and has apneic episodes at night. He is concerned that he is not sleeping well. He wakes up feeling tired. He has chronic DOE which is stable. He occasionally has some dizzy spells when sitting in his chair.  Occasionally his BP runs low with SBP in the 80's.    Wt Readings from Last 3 Encounters:  08/05/13 216 lb (97.977 kg)  04/09/13 218 lb 12.8 oz (99.247 kg)  12/12/12 221 lb 6.4 oz (100.426 kg)     Past Medical History  Diagnosis Date  . Ischemic cardiomyopathy     s/p ICD Implantation by Dr Leonia Reeves  . Chronic systolic dysfunction of left ventricle     EF 30%  . Hyperlipemia   . DJD (degenerative joint disease)   . Gout   . HTN (hypertension) 02/14/2011  . Depression   . Angina   . Myocardial infarction 1997  . Cardiac defibrillator  in situ   . DM (diabetes mellitus) 02/14/2011  . CAD (coronary artery disease)     s/p CABG 1997, PCI (BMS) of SVG to RCA 3/09, redo CABG 02/2011 with SVG to PDA, SVG to Lcx, s/p cath 2.2013 with ocluded SVT to left circ and patent SVG to RCA  . CHF (congestive heart failure)     EF 30% by cath 05/2011  . ICD (implantable cardiac defibrillator) in place   . GERD (gastroesophageal reflux disease)   . Heart attack   . Complication of anesthesia     ONCE WITH BACK SURGERY DIFFICULT TO WAKE UP    Current Outpatient Prescriptions  Medication Sig Dispense Refill  . allopurinol (ZYLOPRIM) 100 MG tablet Take 100 mg by mouth 2 (two) times daily.       Marland Kitchen aspirin EC 81 MG tablet Take 81 mg by mouth daily.      Marland Kitchen atorvastatin (LIPITOR) 40 MG tablet Take 1 tablet (40 mg total) by mouth daily.  30 tablet  6  . BAYER CONTOUR TEST test strip As directed      . benazepril (LOTENSIN) 5 MG tablet Take 5 mg by mouth daily.      . carvedilol (COREG) 25 MG tablet  Take 25 mg by mouth 2 (two) times daily.       . clopidogrel (PLAVIX) 75 MG tablet Take 1 tablet (75 mg total) by mouth daily.  90 tablet  1  . eplerenone (INSPRA) 25 MG tablet Take 25 mg by mouth daily.       . furosemide (LASIX) 40 MG tablet Take 40 mg by mouth 2 (two) times daily.      Marland Kitchen HYDROcodone-acetaminophen (NORCO/VICODIN) 5-325 MG per tablet Take 1-2 tablets by mouth every 4 (four) hours as needed for pain.  30 tablet  1  . insulin glargine (LANTUS) 100 UNIT/ML injection Inject 35 Units into the skin 2 (two) times daily.       . insulin glulisine (APIDRA) 100 UNIT/ML injection Inject 35-40 Units into the skin 2 (two) times daily. Inject 35 units in the morning and 40 units at night.      Marland Kitchen MICROLET LANCETS MISC As directed      . niacin (NIASPAN) 500 MG CR tablet Take 1,000 mg by mouth 2 (two) times daily.       . nitroGLYCERIN (NITROSTAT) 0.4 MG SL tablet Place 0.4 mg under the tongue every 5 (five) minutes x 3 doses as needed. For chest  pain.      . ranolazine (RANEXA) 500 MG 12 hr tablet Take 2 tablets (1,000 mg total) by mouth 2 (two) times daily.  360 tablet  1  . sertraline (ZOLOFT) 50 MG tablet       . traMADol (ULTRAM) 50 MG tablet Take 50 mg by mouth every 6 (six) hours as needed.      . triamcinolone (KENALOG) 0.1 % cream Apply 1 application topically daily as needed. Apply to chest for rash/dry skin.      Marland Kitchen venlafaxine (EFFEXOR-XR) 150 MG 24 hr capsule Take 150 mg by mouth daily.        Marland Kitchen zolpidem (AMBIEN CR) 12.5 MG CR tablet Take 12.5 mg by mouth at bedtime as needed. For sleep       No current facility-administered medications for this visit.    Allergies:    Allergies  Allergen Reactions  . Codeine Nausea And Vomiting  . Latex Rash    Specifies adhesive     Social History:  The patient  reports that he quit smoking about 44 years ago. His smoking use included Cigarettes. He has a 7.5 pack-year smoking history. He has never used smokeless tobacco. He reports that he drinks about .6 ounces of alcohol per week. He reports that he does not use illicit drugs.   Family History:  The patient's family history includes Coronary artery disease in an other family member; Heart attack in his father and mother; Heart failure in his father and mother.   ROS:  Please see the history of present illness.      All other systems reviewed and negative.   PHYSICAL EXAM: VS:  BP 130/72  Pulse 77  Ht 5\' 8"  (1.727 m)  Wt 216 lb (97.977 kg)  BMI 32.85 kg/m2 Well nourished, well developed, in no acute distress HEENT: normal Neck: no JVD Cardiac:  normal S1, S2; RRR; no murmur Lungs:  clear to auscultation bilaterally, no wheezing, rhonchi or rales Abd: soft, nontender, no hepatomegaly Ext: no edema Skin: warm and dry Neuro:  CNs 2-12 intact, no focal abnormalities noted  EKG:     NSR with V paced rhytm  ASSESSMENT AND PLAN:  1.  ASCAD with chest discomfort that he has  a hard time describing but is not like what his  anginal chest pain has been in the past.  There are no associated symptoms and EKG shows NSR with V paced rhythm - I will get a Lexiscan myoview to rule out ischemia from graft failure - continue ASA/Plavix/Ranexa  2.  HTN - well controlled - continue Lotensin/carvedilol  3.  Dyslipidemia - at goal - continue atorvastatin  4.  Chronic systolic CHF well compensated - continue Lasix/beta blocker/ACE I/Inspra  5.  Ischemic DCM s/p BiV AICD 6.  Intermittent orthostasis with SBP as low as 48mmHg - decrease Lasix to 40mg  daily  Followup with me 1 week after stress test complete   Signed, Fransico Him, MD 08/05/2013 1:26 PM

## 2013-08-08 NOTE — Discharge Instructions (Signed)

## 2013-08-08 NOTE — Interval H&P Note (Signed)
History and Physical Interval Note:  08/08/2013 9:44 AM Data reviewed. He denies angina. Has high risk Nuclear due to poor LV function. Procedure and risk discussed.Cath Lab Visit (complete for each Cath Lab visit)  Clinical Evaluation Leading to the Procedure:   ACS: no  Non-ACS:    Anginal Classification: CCS II  Anti-ischemic medical therapy: Maximal Therapy (2 or more classes of medications)  Non-Invasive Test Results: High-risk stress test findings: cardiac mortality >3%/year  Prior CABG: Previous CABG       [name]  has presented today for surgery, with the diagnosis of cp  The various methods of treatment have been discussed with the patient and family. After consideration of risks, benefits and other options for treatment, the patient has consented to  Procedure(s): LEFT HEART CATHETERIZATION WITH CORONARY/GRAFT ANGIOGRAM (N/A) as a surgical intervention .  The patient's history has been reviewed, patient examined, no change in status, stable for surgery.  I have reviewed the patient's chart and labs.  Questions were answered to the patient's satisfaction.     [name]

## 2013-08-09 ENCOUNTER — Telehealth: Payer: Self-pay | Admitting: Cardiology

## 2013-08-09 NOTE — Telephone Encounter (Signed)
Please have patient follow up with me in 2 weeks

## 2013-08-09 NOTE — Telephone Encounter (Signed)
Discussed findings of cath with Dr. Tamala Julian: IMPRESSIONS: 1. Ischemic cardiomyopathy with LVEF less than 20%, with progressive LV dysfunction related to bypass graft failure.  2. Occlusion of the saphenous vein grafts placed in 2012 to the right coronary and to the circumflex  3. Widely patent LIMA to the LAD  4. Totally occluded native right coronary, proximal LAD, and 80% distal left main (unchanged since 2011)  RECOMMENDATION: Difficult situation in a patient with severe LV dysfunction and no real complaints of angina. He does have lateral wall ischemia suggesting that the marginal territory supplied via the left main is a cause of the lateral wall ischemia noted on nuclear testing. PCI of the distal left main would be difficult because of heavy calcification, bifurcation anatomy, and a right angle origin of circumflex from the left main. If PCI is considered, the approach should be femoral and will likely need hemodynamic support with either a balloon pump or Impella.   Since he does not have angina ad only exertional fatigue, would not recommend proceeding with a high risk PCI at this time.  I do not think he is a redo CABG candidate since it would be his 3rd time with history of graft failure in the past.  At this time please have patient continue ASA/Plavix/statin/Ranexa/benazepril.  Add Imdur 30mg  daily. Please set up appt with Dr. Lovena Le to relook at [facility] to make sure no adjustments to biventricular pacing need to be made given his new problems with exertional fatigue.

## 2013-08-11 DIAGNOSIS — H35349 Macular cyst, hole, or pseudohole, unspecified eye: Secondary | ICD-10-CM | POA: Diagnosis not present

## 2013-08-11 DIAGNOSIS — H43829 Vitreomacular adhesion, unspecified eye: Secondary | ICD-10-CM | POA: Diagnosis not present

## 2013-08-11 NOTE — Telephone Encounter (Signed)
Pt is scheduled to see you on May 7th

## 2013-08-13 ENCOUNTER — Encounter: Payer: Self-pay | Admitting: Cardiology

## 2013-08-13 NOTE — Telephone Encounter (Signed)
This encounter was created in error - please disregard.

## 2013-08-13 NOTE — Telephone Encounter (Signed)
New message  Patient calling   Any results from procedure on last Friday. Angioplasty was done by Dr. Tamala Julian.

## 2013-08-21 ENCOUNTER — Encounter: Payer: Self-pay | Admitting: Cardiology

## 2013-08-21 ENCOUNTER — Ambulatory Visit (INDEPENDENT_AMBULATORY_CARE_PROVIDER_SITE_OTHER): Payer: Managed Care, Other (non HMO) | Admitting: *Deleted

## 2013-08-21 ENCOUNTER — Ambulatory Visit (INDEPENDENT_AMBULATORY_CARE_PROVIDER_SITE_OTHER): Payer: Managed Care, Other (non HMO) | Admitting: Cardiology

## 2013-08-21 VITALS — BP 122/60 | HR 84 | Ht 69.0 in | Wt 213.0 lb

## 2013-08-21 DIAGNOSIS — I255 Ischemic cardiomyopathy: Secondary | ICD-10-CM

## 2013-08-21 DIAGNOSIS — I509 Heart failure, unspecified: Secondary | ICD-10-CM

## 2013-08-21 DIAGNOSIS — I2589 Other forms of chronic ischemic heart disease: Secondary | ICD-10-CM

## 2013-08-21 DIAGNOSIS — I5022 Chronic systolic (congestive) heart failure: Secondary | ICD-10-CM

## 2013-08-21 DIAGNOSIS — E785 Hyperlipidemia, unspecified: Secondary | ICD-10-CM

## 2013-08-21 DIAGNOSIS — I2510 Atherosclerotic heart disease of native coronary artery without angina pectoris: Secondary | ICD-10-CM

## 2013-08-21 LAB — MDC_IDC_ENUM_SESS_TYPE_INCLINIC
Battery Voltage: 3.12 V
Brady Statistic AS VP Percent: 96.75 %
Brady Statistic RA Percent Paced: 3.01 %
Brady Statistic RV Percent Paced: 99.74 %
HIGH POWER IMPEDANCE MEASURED VALUE: 44 Ohm
HighPow Impedance: 209 Ohm
HighPow Impedance: 513 Ohm
HighPow Impedance: 56 Ohm
Lead Channel Impedance Value: 399 Ohm
Lead Channel Pacing Threshold Amplitude: 0.5 V
Lead Channel Pacing Threshold Amplitude: 0.875 V
Lead Channel Pacing Threshold Pulse Width: 0.4 ms
Lead Channel Pacing Threshold Pulse Width: 0.4 ms
Lead Channel Sensing Intrinsic Amplitude: 1.75 mV
Lead Channel Sensing Intrinsic Amplitude: 31.625 mV
Lead Channel Setting Pacing Amplitude: 2 V
Lead Channel Setting Pacing Amplitude: 2 V
Lead Channel Setting Pacing Amplitude: 2.5 V
Lead Channel Setting Pacing Pulse Width: 0.4 ms
Lead Channel Setting Pacing Pulse Width: 0.4 ms
MDC IDC MSMT LEADCHNL LV IMPEDANCE VALUE: 399 Ohm
MDC IDC MSMT LEADCHNL LV IMPEDANCE VALUE: 684 Ohm
MDC IDC MSMT LEADCHNL RA IMPEDANCE VALUE: 456 Ohm
MDC IDC MSMT LEADCHNL RA PACING THRESHOLD AMPLITUDE: 1.125 V
MDC IDC MSMT LEADCHNL RA PACING THRESHOLD PULSEWIDTH: 0.4 ms
MDC IDC MSMT LEADCHNL RA SENSING INTR AMPL: 2.25 mV
MDC IDC MSMT LEADCHNL RV IMPEDANCE VALUE: 627 Ohm
MDC IDC MSMT LEADCHNL RV SENSING INTR AMPL: 31.625 mV
MDC IDC SESS DTM: 20150507172931
MDC IDC SET LEADCHNL RV SENSING SENSITIVITY: 0.3 mV
MDC IDC SET ZONE DETECTION INTERVAL: 350 ms
MDC IDC SET ZONE DETECTION INTERVAL: 450 ms
MDC IDC STAT BRADY AP VP PERCENT: 3 %
MDC IDC STAT BRADY AP VS PERCENT: 0.01 %
MDC IDC STAT BRADY AS VS PERCENT: 0.25 %
Zone Setting Detection Interval: 300 ms
Zone Setting Detection Interval: 350 ms

## 2013-08-21 MED ORDER — ISOSORBIDE MONONITRATE ER 30 MG PO TB24: 30.0000 mg | ORAL_TABLET | Freq: Every day | ORAL | Status: DC

## 2013-08-21 NOTE — Patient Instructions (Signed)
Your physician has recommended you make the following change in your medication:   1. Start IMDUR 30 MG 1 tablet daily  Your physician recommends that you schedule a follow-up appointment in: 4 Weeks with Dr Radford Pax

## 2013-08-21 NOTE — Progress Notes (Addendum)
CRT-D device check in office per request of Dr. Radford Pax for Bi-V pacing.  Pt has avg of 35min/day of V sensing episodes---at times Vpacing is <90%. RV & LV Thresholds (RA not tested) and sensing consistent with previous device measurements. Lead impedance trends stable over time. AT/AF <0.1%. No ventricular arrhythmia episodes recorded. Patient bi-ventricularly pacing 99.7% of the time since 12/12/12. Device programmed with appropriate safety margins. Heart failure diagnostics reviewed and trends are stable for patient. No changes made this session. Battery @ 3.12V. ROV w/ Dr. Rayann Heman 12/08/13.

## 2013-08-21 NOTE — Progress Notes (Signed)
[address] [place], [place]  [address] Phone: [phone] Fax:  [phone]  Date:  08/22/2013   ID:  [name], DOB 01-22-1941, MRN [id number]  PCP:  Mayra Neer, MD  Cardiologist:  Fransico Him, MD     History of Present Illness: [name] is a 73 y.o. male with a history of ASCAD s/p CABG in 1997 with cath in 2012 showing occluded SVG to diagonal and 80% stenosis of the SVG to distal RCA and occluded SVG yto OM and then underwent redo CABG with SVG to PDA and SVG to left circ, HTN, chronic systolic CHF, dyslipidemia and ischemic DCM. He presents today for followup post cath done for Chest pain DOE and abnormal nuclear stress test.   Recent cath showed ischemic cardiomyopathy with LVEF less than 20%, with progressive LV dysfunction related to bypass graft failure, occlusion of the saphenous vein grafts placed in 2012 to the right coronary and to the circumflex, Widely patent LIMA to the LAD and totally occluded native right coronary, proximal LAD, and 80% distal left main (unchanged since 2011).  Per Dr. Tamala Julian, PCI of distal LM would be high risk and will pursue medical management at this time. He still has DOE although he has not had any more CP since his cath.   Wt Readings from Last 3 Encounters:  08/21/13 213 lb (96.616 kg)  08/08/13 211 lb (95.709 kg)  08/08/13 211 lb (95.709 kg)     Past Medical History  Diagnosis Date  . Ischemic cardiomyopathy     s/p ICD Implantation by Dr Leonia Reeves  . Chronic systolic dysfunction of left ventricle     EF 30%  . Hyperlipemia   . DJD (degenerative joint disease)   . Gout   . HTN (hypertension) 02/14/2011  . Depression   . Angina   . Myocardial infarction 1997  . Cardiac defibrillator in situ   . DM (diabetes mellitus) 02/14/2011  . CAD (coronary artery disease)     s/p CABG 1997, PCI (BMS) of SVG to RCA 3/09, redo CABG 02/2011 with SVG to PDA, SVG to Lcx, s/p cath 2.2013 with ocluded SVT to left circ and patent  SVG to RCA S/p cath 07/2013 ischemic cardiomyopathy with LVEF less than 20%, with progressive LV dysfunction related to bypass graft failure, occlusion of the saphenous vein grafts placed in 2012 to the right coronary and to the circumflex, Widely patent LIMA to the LAD and totally occluded native right coronary, proximal LAD, and 80% distal left main (unchanged since 2011).  Per Dr. Tamala Julian, PCI of distal LM would be high risk and will pursue medical management at this time.   . CHF (congestive heart failure)     EF 30% by cath 05/2011  . ICD (implantable cardiac defibrillator) in place   . GERD (gastroesophageal reflux disease)   . Heart attack   . Complication of anesthesia     ONCE WITH BACK SURGERY DIFFICULT TO WAKE UP    Current Outpatient Prescriptions  Medication Sig Dispense Refill  . allopurinol (ZYLOPRIM) 100 MG tablet Take 100 mg by mouth 2 (two) times daily.       Marland Kitchen aspirin EC 81 MG tablet Take 81 mg by mouth daily.      Marland Kitchen atorvastatin (LIPITOR) 40 MG tablet Take 1 tablet (40 mg total) by mouth daily.  30 tablet  6  . BAYER CONTOUR TEST test strip As directed      . benazepril (LOTENSIN) 5  MG tablet Take 5 mg by mouth daily.      . carvedilol (COREG) 25 MG tablet Take 25 mg by mouth 2 (two) times daily.       . clopidogrel (PLAVIX) 75 MG tablet Take 1 tablet (75 mg total) by mouth daily.  90 tablet  1  . eplerenone (INSPRA) 25 MG tablet Take 25 mg by mouth daily.       . furosemide (LASIX) 40 MG tablet Take 40 mg by mouth daily.       Marland Kitchen HYDROcodone-acetaminophen (NORCO/VICODIN) 5-325 MG per tablet Take 1-2 tablets by mouth every 4 (four) hours as needed for pain.  30 tablet  1  . insulin glargine (LANTUS) 100 UNIT/ML injection Inject 35 Units into the skin 2 (two) times daily.       . insulin glulisine (APIDRA) 100 UNIT/ML injection Inject 35-40 Units into the skin 2 (two) times daily. Inject 35 units in the morning and 40 units at night.      . isosorbide mononitrate (IMDUR) 30 MG  24 hr tablet Take 1 tablet (30 mg total) by mouth daily.  90 tablet  3  . MICROLET LANCETS MISC As directed      . niacin (NIASPAN) 500 MG CR tablet Take 1,000 mg by mouth 2 (two) times daily.       . nitroGLYCERIN (NITROSTAT) 0.4 MG SL tablet Place 0.4 mg under the tongue every 5 (five) minutes x 3 doses as needed. For chest pain.      Marland Kitchen omeprazole (PRILOSEC OTC) 20 MG tablet Take 20 mg by mouth daily as needed (reflux).      . ranolazine (RANEXA) 500 MG 12 hr tablet Take 2 tablets (1,000 mg total) by mouth 2 (two) times daily.  360 tablet  1  . sertraline (ZOLOFT) 50 MG tablet Take 50 mg by mouth daily.       . traMADol (ULTRAM) 50 MG tablet Take 50 mg by mouth every 6 (six) hours as needed for moderate pain.       Marland Kitchen triamcinolone (KENALOG) 0.1 % cream Apply 1 application topically daily as needed. Apply to chest for rash/dry skin.      Marland Kitchen venlafaxine (EFFEXOR-XR) 150 MG 24 hr capsule Take 150 mg by mouth daily.        Marland Kitchen zolpidem (AMBIEN CR) 12.5 MG CR tablet Take 12.5 mg by mouth at bedtime as needed. For sleep       No current facility-administered medications for this visit.    Allergies:    Allergies  Allergen Reactions  . Codeine Nausea And Vomiting  . Latex Rash    Specifies adhesive    Social History:  The patient  reports that he quit smoking about 44 years ago. His smoking use included Cigarettes. He has a 7.5 pack-year smoking history. He has never used smokeless tobacco. He reports that he drinks about .6 ounces of alcohol per week. He reports that he does not use illicit drugs.   Family History:  The patient's family history includes Coronary artery disease in an other family member; Heart attack in his father and mother; Heart failure in his father and mother.   ROS:  Please see the history of present illness.      All other systems reviewed and negative.   PHYSICAL EXAM: VS:  BP 122/60  Pulse 84  Ht 5\' 9"  (1.753 m)  Wt 213 lb (96.616 kg)  BMI 31.44 kg/m2 Well  nourished, well developed, in no  acute distress HEENT: normal Neck: no JVD Cardiac:  normal S1, S2; RRR; no murmur Lungs:  clear to auscultation bilaterally, no wheezing, rhonchi or rales Abd: soft, nontender, no hepatomegaly Ext: no edema Skin: warm and dry Neuro:  CNs 2-12 intact, no focal abnormalities noted  EKG:  NSR with V pacing     ASSESSMENT AND PLAN:  1. ASCAD with chest discomfort and SOB that he has a hard time describing but is not like what his anginal chest pain has been in the past. There are no associated symptoms and EKG shows NSR with V paced rhythm.  Recent cath showed ischemic cardiomyopathy with LVEF less than 20%, with progressive LV dysfunction related to bypass graft failure, occlusion of the saphenous vein grafts placed in 2012 to the right coronary and to the circumflex, Widely patent LIMA to the LAD and totally occluded native right coronary, proximal LAD, and 80% distal left main (unchanged since 2011).  Per Dr. Tamala Julian, PCI of distal LM would be high risk and will pursue medical management at this time.  - start Imdur 30mg  daily - I will have his AICD checked in device clinic to make sure his BiV pacing is maximized - continue ASA/Plavix/Ranexa/beta blocker 2. HTN - well controlled  - continue Lotensin/carvedilol  3. Dyslipidemia - at goal  - continue atorvastatin  4. Chronic systolic CHF well compensated  - continue Lasix/beta blocker/ACE I/Inspra  5. Ischemic DCM s/p BiV AICD   Followup with me in 4 weeks  Signed, Fransico Him, MD 08/22/2013 11:28 AM

## 2013-08-22 ENCOUNTER — Encounter: Payer: Self-pay | Admitting: Cardiology

## 2013-09-05 ENCOUNTER — Encounter: Payer: Self-pay | Admitting: Internal Medicine

## 2013-09-10 ENCOUNTER — Other Ambulatory Visit: Payer: Self-pay | Admitting: *Deleted

## 2013-09-10 ENCOUNTER — Other Ambulatory Visit: Payer: Self-pay | Admitting: Gastroenterology

## 2013-09-10 DIAGNOSIS — R0602 Shortness of breath: Secondary | ICD-10-CM

## 2013-09-16 ENCOUNTER — Encounter: Payer: Self-pay | Admitting: Cardiology

## 2013-09-16 ENCOUNTER — Other Ambulatory Visit: Payer: Self-pay | Admitting: *Deleted

## 2013-09-16 ENCOUNTER — Ambulatory Visit (INDEPENDENT_AMBULATORY_CARE_PROVIDER_SITE_OTHER): Payer: Managed Care, Other (non HMO) | Admitting: Cardiology

## 2013-09-16 VITALS — BP 124/72 | HR 84 | Ht 69.0 in | Wt 212.0 lb

## 2013-09-16 DIAGNOSIS — R0602 Shortness of breath: Secondary | ICD-10-CM

## 2013-09-16 DIAGNOSIS — E785 Hyperlipidemia, unspecified: Secondary | ICD-10-CM

## 2013-09-16 DIAGNOSIS — I2510 Atherosclerotic heart disease of native coronary artery without angina pectoris: Secondary | ICD-10-CM

## 2013-09-16 DIAGNOSIS — I10 Essential (primary) hypertension: Secondary | ICD-10-CM

## 2013-09-16 DIAGNOSIS — I5022 Chronic systolic (congestive) heart failure: Secondary | ICD-10-CM

## 2013-09-16 DIAGNOSIS — I2589 Other forms of chronic ischemic heart disease: Secondary | ICD-10-CM

## 2013-09-16 DIAGNOSIS — I509 Heart failure, unspecified: Secondary | ICD-10-CM

## 2013-09-16 DIAGNOSIS — I255 Ischemic cardiomyopathy: Secondary | ICD-10-CM

## 2013-09-16 MED ORDER — BENAZEPRIL HCL 10 MG PO TABS: 10.0000 mg | ORAL_TABLET | Freq: Every day | ORAL | Status: DC

## 2013-09-16 NOTE — Progress Notes (Signed)
[address] [place], [place]  [address] Phone: [phone] Fax:  [phone]  Date:  09/16/2013   ID:  [name], DOB February 20, 1941, MRN [id number]  PCP:  Mayra Neer, MD  Cardiologist:  Fransico Him, MD     History of Present Illness: [name] is a 73 y.o. male with a history of ASCAD s/p CABG in 1997 with cath in 2012 showing occluded SVG to diagonal and 80% stenosis of the SVG to distal RCA and occluded SVG yto OM and then underwent redo CABG with SVG to PDA and SVG to left circ, HTN, chronic systolic CHF, dyslipidemia and ischemic DCM.  Recent cath showed ischemic cardiomyopathy with LVEF less than 20%, with progressive LV dysfunction related to bypass graft failure, occlusion of the saphenous vein grafts placed in 2012 to the right coronary and to the circumflex, Widely patent LIMA to the LAD and totally occluded native right coronary, proximal LAD, and 80% distal left main (unchanged since 2011). Per Dr. Tamala Julian, PCI of distal LM would be high risk and will pursue medical management at this time. He was started on Imdur and was referred to EP for maximization of ventricular synchronization.  He presents back today for followup.  He says that his SOB has improved after starting Imdur and also getting his sinusitis treated.  He has chronic stable angina but has not had to use any NTG.  He denies any LE edema,  palpitations or syncope. He occasionally has some dizziness.   Wt Readings from Last 3 Encounters:  09/16/13 212 lb (96.163 kg)  08/21/13 213 lb (96.616 kg)  08/08/13 211 lb (95.709 kg)     Past Medical History  Diagnosis Date  . Ischemic cardiomyopathy     s/p ICD Implantation by Dr Leonia Reeves  . Chronic systolic dysfunction of left ventricle     EF 30%  . Hyperlipemia   . DJD (degenerative joint disease)   . Gout   . HTN (hypertension) 02/14/2011  . Depression   . Angina   . Myocardial infarction 1997  . Cardiac defibrillator in situ   . DM (diabetes  mellitus) 02/14/2011  . CHF (congestive heart failure)     EF 30% by cath 05/2011  . ICD (implantable cardiac defibrillator) in place   . GERD (gastroesophageal reflux disease)   . Heart attack   . Complication of anesthesia     ONCE WITH BACK SURGERY DIFFICULT TO WAKE UP  . CAD (coronary artery disease)     s/p CABG 1997, PCI (BMS) of SVG to RCA 3/09, redo CABG 02/2011 with SVG to PDA, SVG to Lcx, s/p cath 2.2013 with ocluded SVT to left circ and patent SVG to RCA.  Cath 07/2013 Recent cath showed ischemic cardiomyopathy with LVEF less than 20%, with progressive LV dysfunction related to bypass graft failure, occlusion of the saphenous vein grafts placed in 2012 to the right coronary and to the circum    Current Outpatient Prescriptions  Medication Sig Dispense Refill  . allopurinol (ZYLOPRIM) 100 MG tablet Take 100 mg by mouth 2 (two) times daily.       Marland Kitchen aspirin EC 81 MG tablet Take 81 mg by mouth daily.      Marland Kitchen atorvastatin (LIPITOR) 40 MG tablet Take 1 tablet (40 mg total) by mouth daily.  30 tablet  6  . BAYER CONTOUR TEST test strip As directed      . benazepril (LOTENSIN) 5 MG tablet Take 5 mg by  mouth daily.      . carvedilol (COREG) 25 MG tablet Take 25 mg by mouth 2 (two) times daily.       . clopidogrel (PLAVIX) 75 MG tablet Take 1 tablet (75 mg total) by mouth daily.  90 tablet  1  . eplerenone (INSPRA) 25 MG tablet Take 25 mg by mouth daily.       . furosemide (LASIX) 40 MG tablet Take 40 mg by mouth daily.       Marland Kitchen HYDROcodone-acetaminophen (NORCO/VICODIN) 5-325 MG per tablet Take 1-2 tablets by mouth every 4 (four) hours as needed for pain.  30 tablet  1  . insulin glargine (LANTUS) 100 UNIT/ML injection Inject 35 Units into the skin 2 (two) times daily.       . insulin glulisine (APIDRA) 100 UNIT/ML injection Inject 35-40 Units into the skin 2 (two) times daily. Inject 35 units in the morning and 40 units at night.      . isosorbide mononitrate (IMDUR) 30 MG 24 hr tablet Take 1  tablet (30 mg total) by mouth daily.  90 tablet  3  . MICROLET LANCETS MISC As directed      . niacin (NIASPAN) 500 MG CR tablet Take 1,000 mg by mouth 2 (two) times daily.       . nitroGLYCERIN (NITROSTAT) 0.4 MG SL tablet Place 0.4 mg under the tongue every 5 (five) minutes x 3 doses as needed. For chest pain.      Marland Kitchen omeprazole (PRILOSEC OTC) 20 MG tablet Take 20 mg by mouth daily as needed (reflux).      . ranolazine (RANEXA) 500 MG 12 hr tablet Take 2 tablets (1,000 mg total) by mouth 2 (two) times daily.  360 tablet  1  . sertraline (ZOLOFT) 50 MG tablet Take 50 mg by mouth daily.       . traMADol (ULTRAM) 50 MG tablet Take 50 mg by mouth every 6 (six) hours as needed for moderate pain.       Marland Kitchen triamcinolone (KENALOG) 0.1 % cream Apply 1 application topically daily as needed. Apply to chest for rash/dry skin.      Marland Kitchen venlafaxine (EFFEXOR-XR) 150 MG 24 hr capsule Take 150 mg by mouth daily.        Marland Kitchen zolpidem (AMBIEN CR) 12.5 MG CR tablet Take 12.5 mg by mouth at bedtime as needed. For sleep       No current facility-administered medications for this visit.    Allergies:    Allergies  Allergen Reactions  . Codeine Nausea And Vomiting  . Latex Rash    Specifies adhesive     Social History:  The patient  reports that he quit smoking about 44 years ago. His smoking use included Cigarettes. He has a 7.5 pack-year smoking history. He has never used smokeless tobacco. He reports that he drinks about .6 ounces of alcohol per week. He reports that he does not use illicit drugs.   Family History:  The patient's family history includes Coronary artery disease in an other family member; Heart attack in his father and mother; Heart failure in his father and mother.   ROS:  Please see the history of present illness.      All other systems reviewed and negative.   PHYSICAL EXAM: VS:  BP 124/72  Pulse 84  Ht 5\' 9"  (1.753 m)  Wt 212 lb (96.163 kg)  BMI 31.29 kg/m2 Well nourished, well developed,  in no acute distress HEENT: normal Neck:  no JVD Cardiac:  normal S1, S2; RRR; no murmur Lungs:  clear to auscultation bilaterally, no wheezing, rhonchi or rales Abd: soft, nontender, no hepatomegaly Ext: no edema Skin: warm and dry Neuro:  CNs 2-12 intact, no focal abnormalities noted  ASSESSMENT AND PLAN:  1. ASCAD with recent cath showing ischemic cardiomyopathy with LVEF less than 20%, with progressive LV dysfunction related to bypass graft failure, occlusion of the saphenous vein grafts placed in 2012 to the right coronary and to the circumflex, Widely patent LIMA to the LAD and totally occluded native right coronary, proximal LAD, and 80% distal left main (unchanged since 2011). Per Dr. Tamala Julian, PCI of distal LM would be high risk and will pursue medical management at this time.  - He has an appt Friday with Dr. Rayann Heman for  AICD check in device clinic to make sure his BiV pacing is maximized  - continue ASA/Plavix/Ranexa/beta blocker/nitrates/Inspra 2. HTN - well controlled  - continue benazepril/carvedilol  3. Dyslipidemia - at goal  - continue atorvastatin/Niaspan 4. Chronic systolic CHF well compensated  - continue Lasix/beta blocker/ACE I/Inspra  - will titrate ACE I as BP tolerates.  Increase benazepril to 10mg  daily - check BMET in 1 week 5. Ischemic DCM s/p BiV AICD   Followup with me in 3 months      Signed, Fransico Him, MD 09/16/2013 10:02 AM

## 2013-09-16 NOTE — Patient Instructions (Addendum)
Your physician has recommended you make the following change in your medication:  INCREASE BENAZEPRIL (LOTENSIN) TO 10 MG ONCE A DAY.  Your physician recommends that you return for lab work in: [place] (BMET)  Your physician recommends that you schedule a follow-up appointment in: [place].

## 2013-09-18 DIAGNOSIS — H35379 Puckering of macula, unspecified eye: Secondary | ICD-10-CM | POA: Diagnosis not present

## 2013-09-18 DIAGNOSIS — H33329 Round hole, unspecified eye: Secondary | ICD-10-CM | POA: Diagnosis not present

## 2013-09-18 DIAGNOSIS — H35349 Macular cyst, hole, or pseudohole, unspecified eye: Secondary | ICD-10-CM | POA: Diagnosis not present

## 2013-09-19 ENCOUNTER — Ambulatory Visit (HOSPITAL_COMMUNITY): Payer: Managed Care, Other (non HMO) | Attending: Cardiology | Admitting: Cardiology

## 2013-09-19 ENCOUNTER — Ambulatory Visit: Payer: Managed Care, Other (non HMO) | Admitting: *Deleted

## 2013-09-19 ENCOUNTER — Encounter: Payer: Self-pay | Admitting: Internal Medicine

## 2013-09-19 DIAGNOSIS — I2589 Other forms of chronic ischemic heart disease: Secondary | ICD-10-CM | POA: Insufficient documentation

## 2013-09-19 DIAGNOSIS — H35349 Macular cyst, hole, or pseudohole, unspecified eye: Secondary | ICD-10-CM | POA: Diagnosis not present

## 2013-09-19 DIAGNOSIS — I2510 Atherosclerotic heart disease of native coronary artery without angina pectoris: Secondary | ICD-10-CM | POA: Insufficient documentation

## 2013-09-19 DIAGNOSIS — I10 Essential (primary) hypertension: Secondary | ICD-10-CM | POA: Insufficient documentation

## 2013-09-19 DIAGNOSIS — I509 Heart failure, unspecified: Secondary | ICD-10-CM | POA: Insufficient documentation

## 2013-09-19 DIAGNOSIS — R0602 Shortness of breath: Secondary | ICD-10-CM | POA: Insufficient documentation

## 2013-09-19 DIAGNOSIS — I5022 Chronic systolic (congestive) heart failure: Secondary | ICD-10-CM

## 2013-09-19 DIAGNOSIS — E785 Hyperlipidemia, unspecified: Secondary | ICD-10-CM | POA: Insufficient documentation

## 2013-09-19 DIAGNOSIS — I059 Rheumatic mitral valve disease, unspecified: Secondary | ICD-10-CM | POA: Insufficient documentation

## 2013-09-19 LAB — MDC_IDC_ENUM_SESS_TYPE_INCLINIC
Battery Voltage: 3.09 V
Brady Statistic AP VP Percent: 1.67 %
Brady Statistic AP VS Percent: 0.01 %
Brady Statistic AS VP Percent: 98.03 %
Brady Statistic RA Percent Paced: 1.68 %
Date Time Interrogation Session: 20150605185554
HighPow Impedance: 209 Ohm
HighPow Impedance: 45 Ohm
HighPow Impedance: 532 Ohm
HighPow Impedance: 59 Ohm
Lead Channel Impedance Value: 418 Ohm
Lead Channel Impedance Value: 456 Ohm
Lead Channel Impedance Value: 627 Ohm
Lead Channel Impedance Value: 665 Ohm
Lead Channel Pacing Threshold Amplitude: 0.875 V
Lead Channel Pacing Threshold Pulse Width: 0.4 ms
Lead Channel Pacing Threshold Pulse Width: 0.4 ms
Lead Channel Sensing Intrinsic Amplitude: 1.75 mV
Lead Channel Setting Sensing Sensitivity: 0.3 mV
MDC IDC MSMT LEADCHNL LV IMPEDANCE VALUE: 418 Ohm
MDC IDC MSMT LEADCHNL LV PACING THRESHOLD PULSEWIDTH: 0.4 ms
MDC IDC MSMT LEADCHNL RA PACING THRESHOLD AMPLITUDE: 1 V
MDC IDC MSMT LEADCHNL RV PACING THRESHOLD AMPLITUDE: 0.5 V
MDC IDC MSMT LEADCHNL RV SENSING INTR AMPL: 31.625 mV
MDC IDC SET LEADCHNL LV PACING AMPLITUDE: 2 V
MDC IDC SET LEADCHNL LV PACING PULSEWIDTH: 0.4 ms
MDC IDC SET LEADCHNL RA PACING AMPLITUDE: 2 V
MDC IDC SET LEADCHNL RV PACING AMPLITUDE: 2.5 V
MDC IDC SET LEADCHNL RV PACING PULSEWIDTH: 0.4 ms
MDC IDC SET ZONE DETECTION INTERVAL: 350 ms
MDC IDC STAT BRADY AS VS PERCENT: 0.29 %
MDC IDC STAT BRADY RV PERCENT PACED: 99.7 %
Zone Setting Detection Interval: 300 ms
Zone Setting Detection Interval: 350 ms
Zone Setting Detection Interval: 450 ms

## 2013-09-19 NOTE — Progress Notes (Signed)
Echo and AV opt performed.

## 2013-09-22 NOTE — Progress Notes (Signed)
AV opt performed. Changed V-V pace delay from 0 to 10 ms. Follow up as planned.

## 2013-09-23 ENCOUNTER — Other Ambulatory Visit (INDEPENDENT_AMBULATORY_CARE_PROVIDER_SITE_OTHER): Payer: Managed Care, Other (non HMO)

## 2013-09-23 DIAGNOSIS — I10 Essential (primary) hypertension: Secondary | ICD-10-CM

## 2013-09-23 LAB — BASIC METABOLIC PANEL
BUN: 28 mg/dL — AB (ref 6–23)
CHLORIDE: 104 meq/L (ref 96–112)
CO2: 24 mEq/L (ref 19–32)
Calcium: 9.1 mg/dL (ref 8.4–10.5)
Creatinine, Ser: 1.3 mg/dL (ref 0.4–1.5)
GFR: 56.82 mL/min — ABNORMAL LOW (ref >60.00)
Glucose, Bld: 155 mg/dL — ABNORMAL HIGH (ref 70–99)
POTASSIUM: 4.3 meq/L (ref 3.5–5.1)
SODIUM: 136 meq/L (ref 135–145)

## 2013-09-25 NOTE — Progress Notes (Signed)
lmtrc

## 2013-09-29 ENCOUNTER — Encounter: Payer: Self-pay | Admitting: General Surgery

## 2013-10-17 DIAGNOSIS — H35349 Macular cyst, hole, or pseudohole, unspecified eye: Secondary | ICD-10-CM | POA: Diagnosis not present

## 2013-10-21 ENCOUNTER — Other Ambulatory Visit: Payer: Self-pay | Admitting: *Deleted

## 2013-10-21 MED ORDER — NITROGLYCERIN 0.4 MG SL SUBL: 0.4000 mg | SUBLINGUAL_TABLET | SUBLINGUAL | Status: DC | PRN

## 2013-10-28 ENCOUNTER — Other Ambulatory Visit: Payer: Self-pay | Admitting: *Deleted

## 2013-10-28 MED ORDER — CLOPIDOGREL BISULFATE 75 MG PO TABS: 75.0000 mg | ORAL_TABLET | Freq: Every day | ORAL | Status: DC

## 2013-10-30 ENCOUNTER — Encounter (HOSPITAL_COMMUNITY): Payer: Self-pay | Admitting: Pharmacy Technician

## 2013-11-10 ENCOUNTER — Encounter (HOSPITAL_COMMUNITY): Payer: Self-pay | Admitting: *Deleted

## 2013-11-18 ENCOUNTER — Encounter (HOSPITAL_COMMUNITY): Payer: Managed Care, Other (non HMO) | Admitting: Anesthesiology

## 2013-11-18 ENCOUNTER — Encounter (HOSPITAL_COMMUNITY)
Admission: RE | Disposition: A | Discharge status: Home Health | Payer: Self-pay | Source: Ambulatory Visit | Attending: Gastroenterology

## 2013-11-18 ENCOUNTER — Encounter (HOSPITAL_COMMUNITY): Payer: Self-pay | Admitting: *Deleted

## 2013-11-18 ENCOUNTER — Ambulatory Visit (HOSPITAL_COMMUNITY)
Admission: RE | Admit: 2013-11-18 | Discharge: 2013-11-18 | Disposition: A | Discharge status: Home Health | Payer: Managed Care, Other (non HMO) | Source: Ambulatory Visit | Attending: Gastroenterology | Admitting: Gastroenterology

## 2013-11-18 ENCOUNTER — Ambulatory Visit (HOSPITAL_COMMUNITY): Payer: Managed Care, Other (non HMO) | Admitting: Anesthesiology

## 2013-11-18 DIAGNOSIS — Z95810 Presence of automatic (implantable) cardiac defibrillator: Secondary | ICD-10-CM | POA: Insufficient documentation

## 2013-11-18 DIAGNOSIS — Z1211 Encounter for screening for malignant neoplasm of colon: Secondary | ICD-10-CM | POA: Insufficient documentation

## 2013-11-18 DIAGNOSIS — M1990 Unspecified osteoarthritis, unspecified site: Secondary | ICD-10-CM | POA: Insufficient documentation

## 2013-11-18 DIAGNOSIS — N189 Chronic kidney disease, unspecified: Secondary | ICD-10-CM | POA: Insufficient documentation

## 2013-11-18 DIAGNOSIS — I2589 Other forms of chronic ischemic heart disease: Secondary | ICD-10-CM | POA: Insufficient documentation

## 2013-11-18 DIAGNOSIS — I2510 Atherosclerotic heart disease of native coronary artery without angina pectoris: Secondary | ICD-10-CM | POA: Insufficient documentation

## 2013-11-18 DIAGNOSIS — R109 Unspecified abdominal pain: Secondary | ICD-10-CM | POA: Insufficient documentation

## 2013-11-18 DIAGNOSIS — E119 Type 2 diabetes mellitus without complications: Secondary | ICD-10-CM | POA: Insufficient documentation

## 2013-11-18 DIAGNOSIS — Z885 Allergy status to narcotic agent status: Secondary | ICD-10-CM | POA: Insufficient documentation

## 2013-11-18 DIAGNOSIS — I447 Left bundle-branch block, unspecified: Secondary | ICD-10-CM | POA: Insufficient documentation

## 2013-11-18 DIAGNOSIS — Z951 Presence of aortocoronary bypass graft: Secondary | ICD-10-CM | POA: Insufficient documentation

## 2013-11-18 DIAGNOSIS — Z91040 Latex allergy status: Secondary | ICD-10-CM | POA: Insufficient documentation

## 2013-11-18 DIAGNOSIS — E7800 Pure hypercholesterolemia, unspecified: Secondary | ICD-10-CM | POA: Insufficient documentation

## 2013-11-18 DIAGNOSIS — K5730 Diverticulosis of large intestine without perforation or abscess without bleeding: Secondary | ICD-10-CM | POA: Insufficient documentation

## 2013-11-18 DIAGNOSIS — I129 Hypertensive chronic kidney disease with stage 1 through stage 4 chronic kidney disease, or unspecified chronic kidney disease: Secondary | ICD-10-CM | POA: Insufficient documentation

## 2013-11-18 HISTORY — PX: COLONOSCOPY WITH PROPOFOL: SHX5780

## 2013-11-18 LAB — GLUCOSE, CAPILLARY: Glucose-Capillary: 216 mg/dL — ABNORMAL HIGH (ref 70–99)

## 2013-11-18 SURGERY — COLONOSCOPY WITH PROPOFOL
Anesthesia: Monitor Anesthesia Care

## 2013-11-18 MED ORDER — LACTATED RINGERS IV SOLN
INTRAVENOUS | Status: DC
  Administered 2013-11-18: 1000 mL via INTRAVENOUS

## 2013-11-18 MED ORDER — LACTATED RINGERS IV SOLN
INTRAVENOUS | Status: DC | PRN
  Administered 2013-11-18: 13:00:00 via INTRAVENOUS

## 2013-11-18 MED ORDER — PROPOFOL 10 MG/ML IV BOLUS
INTRAVENOUS | Status: AC
  Filled 2013-11-18: qty 20

## 2013-11-18 MED ORDER — PROPOFOL INFUSION 10 MG/ML OPTIME
INTRAVENOUS | Status: DC | PRN
  Administered 2013-11-18: 100 ug/kg/min via INTRAVENOUS

## 2013-11-18 MED ORDER — PROPOFOL 10 MG/ML IV EMUL
INTRAVENOUS | Status: DC | PRN
  Administered 2013-11-18: 50 mg via INTRAVENOUS

## 2013-11-18 MED ORDER — LIDOCAINE HCL (CARDIAC) 20 MG/ML IV SOLN
INTRAVENOUS | Status: AC
  Filled 2013-11-18: qty 5

## 2013-11-18 MED ORDER — SODIUM CHLORIDE 0.9 % IV SOLN: INTRAVENOUS | Status: DC

## 2013-11-18 SURGICAL SUPPLY — 22 items

## 2013-11-18 NOTE — Anesthesia Postprocedure Evaluation (Signed)
  Anesthesia Post-op Note  Patient: [name]  Procedure(s) Performed: Procedure(s): COLONOSCOPY WITH PROPOFOL (N/A)  Patient Location: PACU  Anesthesia Type:MAC  Level of Consciousness: awake, alert  and oriented  Airway and Oxygen Therapy: Patient Spontanous Breathing  Post-op Pain: none  Post-op Assessment: Post-op Vital signs reviewed  Post-op Vital Signs: Reviewed  Last Vitals:  Filed Vitals:   11/18/13 1410  BP: 99/61  Pulse: 73  Temp:   Resp: 13    Complications: No apparent anesthesia complications

## 2013-11-18 NOTE — Transfer of Care (Signed)
Immediate Anesthesia Transfer of Care Note  Patient: [name]  Procedure(s) Performed: Procedure(s): COLONOSCOPY WITH PROPOFOL (N/A)  Patient Location: PACU  Anesthesia Type:MAC  Level of Consciousness: awake, alert  and oriented  Airway & Oxygen Therapy: Patient Spontanous Breathing and Patient connected to face mask oxygen  Post-op Assessment: Report given to PACU RN and Post -op Vital signs reviewed and stable  Post vital signs: Reviewed and stable  Complications: No apparent anesthesia complications

## 2013-11-18 NOTE — Op Note (Signed)
Procedure: Screening colonoscopy  Endoscopist: Earle Gell  Premedication: Propofol administered by anesthesia  Procedure: The patient was placed in the left lateral decubitus position. Anal inspection and digital rectal exam were normal. The Pentax pediatric colonoscope was introduced into the rectum and advanced to the cecum. A normal-appearing appendiceal orifice and ileocecal valve were identified. Colonic preparation for the exam today was good.  Rectum. Normal. Retroflexed view of the distal rectum normal  Sigmoid colon and descending colon. Left colonic diverticulosis  Splenic flexure. Normal  Transverse colon. Normal  Hepatic flexure. Normal  Ascending colon. Normal  Cecum and ileocecal valve. Normal  Assess: Normal screening colonoscopy

## 2013-11-18 NOTE — Discharge Instructions (Signed)
Colonoscopy, Care After °These instructions give you information on caring for yourself after your procedure. Your doctor may also give you more specific instructions. Call your doctor if you have any problems or questions after your procedure. °HOME CARE °· Do not drive for 24 hours. °· Do not sign important papers or use machinery for 24 hours. °· You may shower. °· You may go back to your usual activities, but go slower for the first 24 hours. °· Take rest breaks often during the first 24 hours. °· Walk around or use warm packs on your belly (abdomen) if you have belly cramping or gas. °· Drink enough fluids to keep your pee (urine) clear or pale yellow. °· Resume your normal diet. Avoid heavy or fried foods. °· Avoid drinking alcohol for 24 hours or as told by your doctor. °· Only take medicines as told by your doctor. °If a tissue sample (biopsy) was taken during the procedure:  °· Do not take aspirin or blood thinners for 7 days, or as told by your doctor. °· Do not drink alcohol for 7 days, or as told by your doctor. °· Eat soft foods for the first 24 hours. °GET HELP IF: °You still have a small amount of blood in your poop (stool) 2-3 days after the procedure. °GET HELP RIGHT AWAY IF: °· You have more than a small amount of blood in your poop. °· You see clumps of tissue (blood clots) in your poop. °· Your belly is puffy (swollen). °· You feel sick to your stomach (nauseous) or throw up (vomit). °· You have a fever. °· You have belly pain that gets worse and medicine does not help. °MAKE SURE YOU: °· Understand these instructions. °· Will watch your condition. °· Will get help right away if you are not doing well or get worse. °Document Released: 05/06/2010 Document Revised: 04/08/2013 Document Reviewed: 12/09/2012 °[employer]® Patient Information ©2015 [employer]. This information is not intended to replace advice given to you by your health care provider. Make sure you discuss any questions you have with  your health care provider. ° °

## 2013-11-18 NOTE — Anesthesia Preprocedure Evaluation (Addendum)
Anesthesia Evaluation  Patient identified by MRN, date of birth, ID band Patient awake    Reviewed: Allergy & Precautions, H&P , NPO status , Patient's Chart, lab work & pertinent test results, reviewed documented beta blocker date and time   History of Anesthesia Complications (+) PROLONGED EMERGENCE and history of anesthetic complications  Airway Mallampati: I TM Distance: >3 FB Neck ROM: Full    Dental  (+) Lower Dentures, Upper Dentures   Pulmonary former smoker,  breath sounds clear to auscultation        Cardiovascular hypertension, + CAD, + Past MI, + CABG and +CHF + Cardiac Defibrillator Rhythm:Regular Rate:Normal  EF25-30%. ICD in place- never fired.   Neuro/Psych PSYCHIATRIC DISORDERS Depression    GI/Hepatic Neg liver ROS, GERD-  ,  Endo/Other  diabetes, Type 2, Insulin Dependent  Renal/GU negative Renal ROS  negative genitourinary   Musculoskeletal negative musculoskeletal ROS (+)   Abdominal   Peds  Hematology negative hematology ROS (+)   Anesthesia Other Findings   Reproductive/Obstetrics negative OB ROS                         Anesthesia Physical Anesthesia Plan  ASA: III  Anesthesia Plan: MAC   Post-op Pain Management:    Induction:   Airway Management Planned: Simple Face Mask  Additional Equipment: None  Intra-op Plan:   Post-operative Plan:   Informed Consent: I have reviewed the patients History and Physical, chart, labs and discussed the procedure including the risks, benefits and alternatives for the proposed anesthesia with the patient or authorized representative who has indicated his/her understanding and acceptance.   Dental advisory given  Plan Discussed with: CRNA and Surgeon  Anesthesia Plan Comments:         Anesthesia Quick Evaluation

## 2013-11-18 NOTE — H&P (Signed)
  Procedure: Screening colonoscopy. Cardiac defibrillator in place. Ischemic cardiomyopathy  History: The patient is a 73 year old male born Jun 15, 1940. He has type 2 diabetes mellitus and ischemic cardiomyopathy. He chronically takes aspirin. He tells me he underwent a colonoscopy approximately 12 years ago.  The patient has been experiencing intermittent crampy lower abdominal pain unassociated with gastrointestinal bleeding.  He is scheduled to undergo a repeat screening colonoscopy.  Past medical history: Type 2 diabetes mellitus. Multivessel coronary artery disease. Coronary artery bypass grafting. Hypercholesterolemia. Osteoarthritis. Ischemic cardiomyopathy. Left ventricular ejection fraction 30%. Cardiac defibrillator. Chronic left bundle branch block pattern on electrocardiogram. Hypertension. Chronic kidney disease. Cholecystectomy. Bilateral cataract surgeries. Right knee surgery. Back surgery.  Medication allergies: Codeine causes nausea. Latex causes rash.  Exam: The patient is alert and lying comfortably on the endoscopy stretcher. Abdomen is soft and nontender to palpation. Lungs are clear to auscultation. Cardiac exam reveals a regular rhythm.  Plan: Proceed with screening colonoscopy

## 2013-11-19 ENCOUNTER — Encounter (HOSPITAL_COMMUNITY): Payer: Self-pay | Admitting: Gastroenterology

## 2013-12-05 ENCOUNTER — Other Ambulatory Visit: Payer: Self-pay

## 2013-12-08 ENCOUNTER — Encounter: Payer: Managed Care, Other (non HMO) | Admitting: Internal Medicine

## 2013-12-17 ENCOUNTER — Ambulatory Visit: Payer: Managed Care, Other (non HMO) | Admitting: Cardiology

## 2013-12-19 ENCOUNTER — Other Ambulatory Visit: Payer: Managed Care, Other (non HMO)

## 2013-12-23 ENCOUNTER — Other Ambulatory Visit (INDEPENDENT_AMBULATORY_CARE_PROVIDER_SITE_OTHER): Payer: Managed Care, Other (non HMO)

## 2013-12-23 DIAGNOSIS — E785 Hyperlipidemia, unspecified: Secondary | ICD-10-CM | POA: Diagnosis not present

## 2013-12-23 LAB — HEPATIC FUNCTION PANEL
ALT: 20 U/L (ref 0–53)
AST: 18 U/L (ref 0–37)
Albumin: 3.8 g/dL (ref 3.5–5.2)
Alkaline Phosphatase: 68 U/L (ref 39–117)
BILIRUBIN DIRECT: 0 mg/dL (ref 0.0–0.3)
BILIRUBIN TOTAL: 0.4 mg/dL (ref 0.2–1.2)
Total Protein: 7.4 g/dL (ref 6.0–8.3)

## 2013-12-25 LAB — NMR LIPOPROFILE WITH LIPIDS
Cholesterol, Total: 120 mg/dL (ref 100–199)
HDL Particle Number: 26.5 umol/L — ABNORMAL LOW (ref >=30.5)
HDL SIZE: 8.9 nm — AB (ref >=9.2)
HDL-C: 35 mg/dL — AB (ref >39)
LARGE HDL: 4 umol/L — AB (ref >=4.8)
LDL (calc): 57 mg/dL (ref 0–99)
LDL Particle Number: 816 nmol/L (ref <1000)
LDL Size: 20.3 nm (ref >=20.8)
LP-IR SCORE: 65 — AB (ref <=45)
Large VLDL-P: 6.3 nmol/L — ABNORMAL HIGH (ref <=2.7)
Small LDL Particle Number: 478 nmol/L (ref <=527)
TRIGLYCERIDES: 142 mg/dL (ref 0–149)
VLDL Size: 52.6 nm — ABNORMAL HIGH (ref <=46.6)

## 2013-12-26 ENCOUNTER — Other Ambulatory Visit: Payer: Self-pay

## 2013-12-26 MED ORDER — RANOLAZINE ER 500 MG PO TB12: 500.0000 mg | ORAL_TABLET | Freq: Two times a day (BID) | ORAL | Status: DC

## 2013-12-29 ENCOUNTER — Other Ambulatory Visit: Payer: Self-pay | Admitting: General Surgery

## 2013-12-29 ENCOUNTER — Encounter: Payer: Self-pay | Admitting: General Surgery

## 2013-12-29 DIAGNOSIS — E785 Hyperlipidemia, unspecified: Secondary | ICD-10-CM

## 2014-01-06 ENCOUNTER — Other Ambulatory Visit: Payer: Self-pay

## 2014-01-07 ENCOUNTER — Ambulatory Visit (INDEPENDENT_AMBULATORY_CARE_PROVIDER_SITE_OTHER): Payer: Managed Care, Other (non HMO) | Admitting: Internal Medicine

## 2014-01-07 ENCOUNTER — Encounter: Payer: Self-pay | Admitting: Internal Medicine

## 2014-01-07 VITALS — BP 90/58 | HR 79 | Ht 70.0 in | Wt 210.8 lb

## 2014-01-07 DIAGNOSIS — I5022 Chronic systolic (congestive) heart failure: Secondary | ICD-10-CM

## 2014-01-07 DIAGNOSIS — I10 Essential (primary) hypertension: Secondary | ICD-10-CM

## 2014-01-07 DIAGNOSIS — I2589 Other forms of chronic ischemic heart disease: Secondary | ICD-10-CM

## 2014-01-07 DIAGNOSIS — I509 Heart failure, unspecified: Secondary | ICD-10-CM | POA: Diagnosis not present

## 2014-01-07 DIAGNOSIS — I2510 Atherosclerotic heart disease of native coronary artery without angina pectoris: Secondary | ICD-10-CM

## 2014-01-07 DIAGNOSIS — I255 Ischemic cardiomyopathy: Secondary | ICD-10-CM

## 2014-01-07 LAB — MDC_IDC_ENUM_SESS_TYPE_INCLINIC
Battery Voltage: 3.1 V
Brady Statistic AP VP Percent: 1.56 %
Brady Statistic AP VS Percent: 0.01 %
Brady Statistic AS VS Percent: 0.94 %
Brady Statistic RV Percent Paced: 99.05 %
Date Time Interrogation Session: 20150923104747
HIGH POWER IMPEDANCE MEASURED VALUE: 475 Ohm
HIGH POWER IMPEDANCE MEASURED VALUE: 57 Ohm
HighPow Impedance: 209 Ohm
HighPow Impedance: 42 Ohm
Lead Channel Impedance Value: 399 Ohm
Lead Channel Pacing Threshold Amplitude: 0.5 V
Lead Channel Pacing Threshold Amplitude: 0.75 V
Lead Channel Pacing Threshold Amplitude: 1 V
Lead Channel Pacing Threshold Pulse Width: 0.4 ms
Lead Channel Pacing Threshold Pulse Width: 0.4 ms
Lead Channel Sensing Intrinsic Amplitude: 31.625 mV
Lead Channel Setting Pacing Amplitude: 2 V
Lead Channel Setting Pacing Amplitude: 2 V
Lead Channel Setting Pacing Amplitude: 2.5 V
Lead Channel Setting Pacing Pulse Width: 0.4 ms
Lead Channel Setting Pacing Pulse Width: 0.4 ms
MDC IDC MSMT LEADCHNL LV IMPEDANCE VALUE: 418 Ohm
MDC IDC MSMT LEADCHNL LV IMPEDANCE VALUE: 684 Ohm
MDC IDC MSMT LEADCHNL RA IMPEDANCE VALUE: 456 Ohm
MDC IDC MSMT LEADCHNL RA PACING THRESHOLD PULSEWIDTH: 0.4 ms
MDC IDC MSMT LEADCHNL RA SENSING INTR AMPL: 1.75 mV
MDC IDC MSMT LEADCHNL RV IMPEDANCE VALUE: 570 Ohm
MDC IDC SET LEADCHNL RV SENSING SENSITIVITY: 0.3 mV
MDC IDC SET ZONE DETECTION INTERVAL: 300 ms
MDC IDC STAT BRADY AS VP PERCENT: 97.49 %
MDC IDC STAT BRADY RA PERCENT PACED: 1.57 %
Zone Setting Detection Interval: 350 ms
Zone Setting Detection Interval: 350 ms
Zone Setting Detection Interval: 450 ms

## 2014-01-07 MED ORDER — FUROSEMIDE 40 MG PO TABS: 40.0000 mg | ORAL_TABLET | Freq: Every day | ORAL | Status: DC

## 2014-01-07 NOTE — Progress Notes (Signed)
PCP: Mayra Neer, MD Primary Cardiologist:  Dr Ellyn Hack is a 73 y.o. male who presents today for routine electrophysiology followup.  Since his last office visit, the patient reports doing very well.  He remains active. He has postural dizziness frequently. Today, he denies symptoms of palpitations, chest pain, shortness of breath,  lower extremity edema, presyncope, syncope, or ICD shocks.  The patient is otherwise without complaint today.   Past Medical History  Diagnosis Date  . Ischemic cardiomyopathy     s/p ICD Implantation by Dr Leonia Reeves  . Chronic systolic dysfunction of left ventricle     EF 30%  . Hyperlipemia   . DJD (degenerative joint disease)   . Gout   . HTN (hypertension) 02/14/2011  . Depression   . Angina   . Myocardial infarction 1997  . Cardiac defibrillator in situ   . DM (diabetes mellitus) 02/14/2011  . CHF (congestive heart failure)     EF 30% by cath 05/2011  . ICD (implantable cardiac defibrillator) in place   . GERD (gastroesophageal reflux disease)   . Heart attack   . Complication of anesthesia     ONCE WITH BACK SURGERY DIFFICULT TO WAKE UP  . CAD (coronary artery disease)     s/p CABG 1997, PCI (BMS) of SVG to RCA 3/09, redo CABG 02/2011 with SVG to PDA, SVG to Lcx, s/p cath 2.2013 with ocluded SVT to left circ and patent SVG to RCA.  Cath 07/2013 Recent cath showed ischemic cardiomyopathy with LVEF less than 20%, with progressive LV dysfunction related to bypass graft failure, occlusion of the saphenous vein grafts placed in 2012 to the right coronary and to the circum   Past Surgical History  Procedure Laterality Date  . Cardiac defibrillator placement  08/2004    initial placement, upgraded to [place] ICD by Dr Lovena Le 08/24/11 (MDT)  . Knee arthrotomy  ~ 1978    RIGHT KNEE CARTILAGE REMOVED  . Coronary angioplasty with stent placement  06/2007    BMS to SVG to RCA  . Coronary angioplasty  02/2011  . Cataract extraction w/ intraocular lens   implant, bilateral  2012  . Cardiac defibrillator placement  2009  . Lumbar disc surgery  2003  . Back surgery  2003  . Coronary artery bypass graft  01/1996    CABG x 5 LIMA to LAD SVG to diag1,2,svg to om,svg to RCA  . Coronary artery bypass graft  03/06/2011    CABG X2; Procedure: REDO CORONARY ARTERY BYPASS GRAFTING (CABG);  Surgeon: Grace Isaac, MD;  Location: [facility];  Service: Open Heart Surgery;  Laterality: N/A;  times two grafts using right saphenous vein harvested endoscopically.  . Cholecystectomy  01/05/2012    Procedure: LAPAROSCOPIC CHOLECYSTECTOMY WITH INTRAOPERATIVE CHOLANGIOGRAM;  Surgeon: Joyice Faster. Cornett, MD;  Location: [facility];  Service: General;  Laterality: N/A;  laparoscopic cholecysectoym with intraoperative cholangiogram  . Colonoscopy with propofol N/A 11/18/2013    Procedure: COLONOSCOPY WITH PROPOFOL;  Surgeon: Garlan Fair, MD;  Location: [facility];  Service: Endoscopy;  Laterality: N/A;    Current Outpatient Prescriptions  Medication Sig Dispense Refill  . allopurinol (ZYLOPRIM) 100 MG tablet Take 100 mg by mouth 2 (two) times daily.       Marland Kitchen aspirin EC 81 MG tablet Take 81 mg by mouth daily.      . benazepril (LOTENSIN) 10 MG tablet Take 10 mg by mouth every morning.      . carvedilol (COREG)  25 MG tablet Take 25 mg by mouth at bedtime.       Marland Kitchen eplerenone (INSPRA) 25 MG tablet Take 25 mg by mouth every morning.       . furosemide (LASIX) 40 MG tablet Take 1 tablet (40 mg total) by mouth daily.  30 tablet    . insulin lispro protamine-lispro (HUMALOG 50/50 MIX) (50-50) 100 UNIT/ML SUSP injection Inject 20-30 Units into the skin 3 (three) times daily. Takes 30 units in the morning 30 at lunch and 20 at night      . isosorbide mononitrate (IMDUR) 30 MG 24 hr tablet Take 30 mg by mouth at bedtime.      . niacin (NIASPAN) 500 MG CR tablet Take 500 mg by mouth 2 (two) times daily.       . ranolazine (RANEXA) 500 MG 12 hr tablet Take 1 tablet (500 mg total) by  mouth 2 (two) times daily.  60 tablet  2  . sertraline (ZOLOFT) 50 MG tablet Take 50 mg by mouth 2 (two) times daily.       Marland Kitchen venlafaxine (EFFEXOR-XR) 150 MG 24 hr capsule Take 150 mg by mouth at bedtime.       Marland Kitchen zolpidem (AMBIEN CR) 12.5 MG CR tablet Take 12.5 mg by mouth at bedtime as needed. For sleep      . nitroGLYCERIN (NITROSTAT) 0.4 MG SL tablet Place 1 tablet (0.4 mg total) under the tongue every 5 (five) minutes x 3 doses as needed. For chest pain.  25 tablet  3   No current facility-administered medications for this visit.    Physical Exam: Filed Vitals:   01/07/14 1005  BP: 90/58  Pulse: 79  Height: 5\' 10"  (1.778 m)  Weight: 210 lb 12.8 oz (95.618 kg)    GEN- The patient is well appearing, alert and oriented x 3 today.   Head- normocephalic, atraumatic Eyes-  Sclera clear, conjunctiva pink Ears- hearing intact Oropharynx- clear Lungs- Clear to ausculation bilaterally, normal work of breathing Chest- ICD pocket is well healed, skin over the incision is thin but not retracted Heart- Regular rate and rhythm, no murmurs, rubs or gallops, PMI not laterally displaced GI- soft, NT, ND, + BS Extremities- no clubbing, cyanosis, or edema  ICD interrogation- reviewed in detail today,  See PACEART report   Assessment and Plan:  1. Chronic systolic dysfunction Appears dry today Reduce lasix to 40mg  daily Normal BiV ICD function See Pace Art report No changes today Will enroll in ICM clinic with Alvis Lemmings for optivol management Underwent AV optimization of his device 6/15.  I do not believe that we have any additional optimization options for his device  2. HTN Stable No change required today  Will enroll in carelink Return in 1 year

## 2014-01-07 NOTE — Patient Instructions (Addendum)
Your physician wants you to follow-up in: 12 months with Dr. Vallery Ridge will receive a reminder letter in the mail two months in advance. If you don't receive a letter, please call our office to schedule the follow-up appointment.   You will receive a call from Alvis Lemmings, RN   Remote monitoring is used to monitor your Pacemaker or ICD from home. This monitoring reduces the number of office visits required to check your device to one time per year. It allows [place] to keep an eye on the functioning of your device to ensure it is working properly. You are scheduled for a device check from home on 04/13/14. You may send your transmission at any time that day. If you have a wireless device, the transmission will be sent automatically. After your physician reviews your transmission, you will receive a postcard with your next transmission date.  Your physician has recommended you make the following change in your medication:  1) Decrease Furosemide to 40mg  daily    Thank you for choosing [facility]!!     Janan Halter, RN [phone]

## 2014-01-20 ENCOUNTER — Encounter: Payer: Self-pay | Admitting: Cardiology

## 2014-01-20 ENCOUNTER — Ambulatory Visit (INDEPENDENT_AMBULATORY_CARE_PROVIDER_SITE_OTHER): Payer: Managed Care, Other (non HMO) | Admitting: Cardiology

## 2014-01-20 VITALS — BP 104/64 | HR 60 | Ht 70.0 in | Wt 207.0 lb

## 2014-01-20 DIAGNOSIS — I5022 Chronic systolic (congestive) heart failure: Secondary | ICD-10-CM | POA: Diagnosis not present

## 2014-01-20 DIAGNOSIS — E119 Type 2 diabetes mellitus without complications: Secondary | ICD-10-CM

## 2014-01-20 DIAGNOSIS — I255 Ischemic cardiomyopathy: Secondary | ICD-10-CM

## 2014-01-20 DIAGNOSIS — I10 Essential (primary) hypertension: Secondary | ICD-10-CM

## 2014-01-20 DIAGNOSIS — E785 Hyperlipidemia, unspecified: Secondary | ICD-10-CM

## 2014-01-20 DIAGNOSIS — I2510 Atherosclerotic heart disease of native coronary artery without angina pectoris: Secondary | ICD-10-CM | POA: Diagnosis not present

## 2014-01-20 LAB — BASIC METABOLIC PANEL
BUN: 19 mg/dL (ref 6–23)
CALCIUM: 8.9 mg/dL (ref 8.4–10.5)
CO2: 25 mEq/L (ref 19–32)
CREATININE: 1.4 mg/dL (ref 0.4–1.5)
Chloride: 105 mEq/L (ref 96–112)
GFR: 52.18 mL/min — ABNORMAL LOW (ref >60.00)
GLUCOSE: 287 mg/dL — AB (ref 70–99)
Potassium: 4.4 mEq/L (ref 3.5–5.1)
SODIUM: 134 meq/L — AB (ref 135–145)

## 2014-01-20 MED ORDER — RANOLAZINE ER 1000 MG PO TB12: 1000.0000 mg | ORAL_TABLET | Freq: Two times a day (BID) | ORAL | Status: DC

## 2014-01-20 NOTE — Patient Instructions (Addendum)
Your physician has recommended you make the following change in your medication:  1) INCREASE Ranexa  To 1000mg  twice daily. An Rx has been sent to your pharmacy   Lab today: Bmet  You have been referred to [facility]

## 2014-01-20 NOTE — Progress Notes (Signed)
[name], [address] [place], [place]  [address] Phone: [phone] Fax:  [phone]  Date:  01/20/2014   ID:  [name], DOB [birth year], MRN [id number]  PCP:  Mayra Neer, MD  Cardiologist:  Fransico Him, MD    History of Present Illness: [name] is a 73 y.o. male with a history of ASCAD s/p CABG in 1997 with cath in 2012 showing occluded SVG to diagonal and 80% stenosis of the SVG to distal RCA and occluded SVG yto OM and then underwent redo CABG with SVG to PDA and SVG to left circ, HTN, chronic systolic CHF, dyslipidemia and ischemic DCM. Recent cath showed ischemic cardiomyopathy with LVEF less than 20%, with progressive LV dysfunction related to bypass graft failure, occlusion of the saphenous vein grafts placed in 2012 to the right coronary and to the circumflex, Widely patent LIMA to the LAD and totally occluded native right coronary, proximal LAD, and 80% distal left main (unchanged since 2011). Per Dr. Tamala Julian, PCI of distal LM would be high risk and will pursue medical management at this time. He presents back today for followup. He says that his SOB has improved after starting Imdur. He has chronic stable angina but since I saw him last he has had to use NTG x 1 on a few occasions. He was taken off diuretics due to dizziness from ? Dehydration and dizziness has resolved. He denies any LE edema, palpitations or syncope.    Wt Readings from Last 3 Encounters:  01/20/14 207 lb (93.895 kg)  01/07/14 210 lb 12.8 oz (95.618 kg)  11/18/13 210 lb (95.255 kg)     Past Medical History  Diagnosis Date  . Ischemic cardiomyopathy     s/p ICD Implantation by Dr Leonia Reeves  . Chronic systolic dysfunction of left ventricle     EF 30%  . Hyperlipemia   . DJD (degenerative joint disease)   . Gout   . HTN (hypertension) 02/14/2011  . Depression   . Angina   . Myocardial infarction 1997  . Cardiac defibrillator in situ   . DM (diabetes mellitus) 02/14/2011  . CHF  (congestive heart failure)     EF 30% by cath 05/2011  . ICD (implantable cardiac defibrillator) in place   . GERD (gastroesophageal reflux disease)   . Heart attack   . Complication of anesthesia     ONCE WITH BACK SURGERY DIFFICULT TO WAKE UP  . CAD (coronary artery disease)     s/p CABG 1997, PCI (BMS) of SVG to RCA 3/09, redo CABG 02/2011 with SVG to PDA, SVG to Lcx, s/p cath 2.2013 with ocluded SVT to left circ and patent SVG to RCA.  Cath 07/2013 Recent cath showed ischemic cardiomyopathy with LVEF less than 20%, with progressive LV dysfunction related to bypass graft failure, occlusion of the saphenous vein grafts placed in 2012 to the right coronary and to the circum    Current Outpatient Prescriptions  Medication Sig Dispense Refill  . allopurinol (ZYLOPRIM) 100 MG tablet Take 100 mg by mouth 2 (two) times daily.       Marland Kitchen aspirin EC 81 MG tablet Take 81 mg by mouth daily.      Marland Kitchen atorvastatin (LIPITOR) 40 MG tablet Take 40 mg by mouth daily at 6 PM.       . benazepril (LOTENSIN) 10 MG tablet Take 10 mg by mouth every morning.      . carvedilol (COREG) 25 MG tablet Take 25 mg by  mouth at bedtime.       Marland Kitchen eplerenone (INSPRA) 25 MG tablet Take 25 mg by mouth every morning.       . furosemide (LASIX) 40 MG tablet Take 1 tablet (40 mg total) by mouth daily.  30 tablet    . insulin lispro protamine-lispro (HUMALOG 50/50 MIX) (50-50) 100 UNIT/ML SUSP injection Inject 20-30 Units into the skin 3 (three) times daily. Takes 30 units in the morning 30 at lunch and 20 at night      . isosorbide mononitrate (IMDUR) 30 MG 24 hr tablet Take 30 mg by mouth at bedtime.      . niacin (NIASPAN) 500 MG CR tablet Take 500 mg by mouth 2 (two) times daily.       . nitroGLYCERIN (NITROSTAT) 0.4 MG SL tablet Place 1 tablet (0.4 mg total) under the tongue every 5 (five) minutes x 3 doses as needed. For chest pain.  25 tablet  3  . ranolazine (RANEXA) 500 MG 12 hr tablet Take 1 tablet (500 mg total) by mouth 2  (two) times daily.  60 tablet  2  . sertraline (ZOLOFT) 50 MG tablet Take 50 mg by mouth 2 (two) times daily.       Marland Kitchen venlafaxine (EFFEXOR-XR) 150 MG 24 hr capsule Take 150 mg by mouth at bedtime.       Marland Kitchen zolpidem (AMBIEN CR) 12.5 MG CR tablet Take 12.5 mg by mouth at bedtime as needed. For sleep       No current facility-administered medications for this visit.    Allergies:    Allergies  Allergen Reactions  . Codeine Nausea And Vomiting  . Latex Rash    Specifies adhesive     Social History:  The patient  reports that he quit smoking about 44 years ago. His smoking use included Cigarettes. He has a 7.5 pack-year smoking history. He has never used smokeless tobacco. He reports that he drinks about .6 ounces of alcohol per week. He reports that he does not use illicit drugs.   Family History:  The patient's family history includes Coronary artery disease in an other family member; Heart attack in his father and mother; Heart failure in his father and mother.   ROS:  Please see the history of present illness.      All other systems reviewed and negative.   PHYSICAL EXAM: VS:  BP 104/64  Pulse 60  Ht 5\' 10"  (1.778 m)  Wt 207 lb (93.895 kg)  BMI 29.70 kg/m2 Well nourished, well developed, in no acute distress HEENT: normal Neck: no JVD Cardiac:  normal S1, S2; RRR; no murmur Lungs:  clear to auscultation bilaterally, no wheezing, rhonchi or rales Abd: soft, nontender, no hepatomegaly Ext: no edema Skin: warm and dry Neuro:  CNs 2-12 intact, no focal abnormalities noted   ASSESSMENT AND PLAN:  1. ASCAD with recent cath showing ischemic cardiomyopathy with LVEF less than 20%, with progressive LV dysfunction related to bypass graft failure, occlusion of the saphenous vein grafts placed in 2012 to the right coronary and to the circumflex, Widely patent LIMA to the LAD and totally occluded native right coronary, proximal LAD, and 80% distal left main (unchanged since 2011). Per Dr.  Tamala Julian, PCI of distal LM would be high risk and will pursue medical management at this time.  - continue ASA/Plavix/Ranexa/beta blocker/nitrates/Inspra  - I will increase Ranexa to 1000mg  BID since he is still having episodes of angina 2. HTN - well controlled  -  continue benazepril/carvedilol  3. Dyslipidemia - at goal at 57 on 12/23/2013 - continue atorvastatin/Niaspan  4. Chronic systolic CHF - he does not appear volume overloaded on exam today.  He is off diuretics at present due to hypotension.   - continue beta blocker/ACE I - check BMET  - I am going to refer him to advanced CHF clinic for further evaluation 5. Ischemic DCM s/p BiV AICD  6.  Type II DM - followup per PCP 7.  Orthostatic hypotension - currently off fluid pills  Followup with me in 3 months     Signed, Fransico Him, MD [facility] 01/20/2014 2:42 PM

## 2014-01-21 ENCOUNTER — Telehealth: Payer: Self-pay | Admitting: Cardiology

## 2014-01-21 NOTE — Telephone Encounter (Signed)
Pt is aware of lab results and MD's recommendations. Pt verbalized understanding.

## 2014-01-21 NOTE — Telephone Encounter (Signed)
New message     Returning [name]'s call from yesterday

## 2014-01-29 ENCOUNTER — Telehealth: Payer: Self-pay

## 2014-02-03 ENCOUNTER — Other Ambulatory Visit: Payer: Self-pay

## 2014-02-03 MED ORDER — CLOPIDOGREL BISULFATE 75 MG PO TABS
75.0000 mg | ORAL_TABLET | Freq: Every day | ORAL | Status: DC
Start: 2014-02-03 — End: 2018-07-15

## 2014-02-03 NOTE — Telephone Encounter (Signed)
This looks like Dr. Theodosia Blender patient. See her 01/20/14 office note. It looks as though the patient should be on plavix. Please call him to confirm this if there is a question. If he is not on it, please forward a note to Dr. Radford Pax to let her know and get her recommendations.

## 2014-02-03 NOTE — Telephone Encounter (Signed)
?   Dr. Radford Pax patient.?

## 2014-02-04 NOTE — Telephone Encounter (Signed)
He is supposed to be on Plavix

## 2014-02-05 NOTE — Telephone Encounter (Signed)
Patient confirmed he is taking Plavix 75 mg daily.

## 2014-02-10 ENCOUNTER — Encounter (HOSPITAL_COMMUNITY): Payer: Self-pay

## 2014-02-10 ENCOUNTER — Other Ambulatory Visit: Payer: Self-pay | Admitting: *Deleted

## 2014-02-10 ENCOUNTER — Encounter (HOSPITAL_COMMUNITY): Payer: Self-pay | Admitting: *Deleted

## 2014-02-10 ENCOUNTER — Ambulatory Visit (HOSPITAL_COMMUNITY)
Admission: RE | Admit: 2014-02-10 | Discharge: 2014-02-10 | Disposition: A | Discharge status: Home / Self Care | Payer: Managed Care, Other (non HMO) | Source: Ambulatory Visit | Attending: Cardiology | Admitting: Cardiology

## 2014-02-10 VITALS — BP 90/52 | HR 77 | Ht 70.0 in | Wt 210.8 lb

## 2014-02-10 DIAGNOSIS — K219 Gastro-esophageal reflux disease without esophagitis: Secondary | ICD-10-CM | POA: Diagnosis not present

## 2014-02-10 DIAGNOSIS — Z87891 Personal history of nicotine dependence: Secondary | ICD-10-CM | POA: Insufficient documentation

## 2014-02-10 DIAGNOSIS — I255 Ischemic cardiomyopathy: Secondary | ICD-10-CM | POA: Insufficient documentation

## 2014-02-10 DIAGNOSIS — E785 Hyperlipidemia, unspecified: Secondary | ICD-10-CM

## 2014-02-10 DIAGNOSIS — I25118 Atherosclerotic heart disease of native coronary artery with other forms of angina pectoris: Secondary | ICD-10-CM | POA: Diagnosis not present

## 2014-02-10 DIAGNOSIS — I5022 Chronic systolic (congestive) heart failure: Secondary | ICD-10-CM

## 2014-02-10 DIAGNOSIS — I2510 Atherosclerotic heart disease of native coronary artery without angina pectoris: Secondary | ICD-10-CM | POA: Insufficient documentation

## 2014-02-10 DIAGNOSIS — Z951 Presence of aortocoronary bypass graft: Secondary | ICD-10-CM | POA: Insufficient documentation

## 2014-02-10 DIAGNOSIS — N189 Chronic kidney disease, unspecified: Secondary | ICD-10-CM | POA: Diagnosis not present

## 2014-02-10 DIAGNOSIS — Z7982 Long term (current) use of aspirin: Secondary | ICD-10-CM | POA: Diagnosis not present

## 2014-02-10 DIAGNOSIS — I2582 Chronic total occlusion of coronary artery: Secondary | ICD-10-CM | POA: Insufficient documentation

## 2014-02-10 DIAGNOSIS — E119 Type 2 diabetes mellitus without complications: Secondary | ICD-10-CM | POA: Diagnosis not present

## 2014-02-10 DIAGNOSIS — Z794 Long term (current) use of insulin: Secondary | ICD-10-CM | POA: Insufficient documentation

## 2014-02-10 MED ORDER — CARVEDILOL 12.5 MG PO TABS: ORAL_TABLET | ORAL | Status: DC

## 2014-02-10 NOTE — Patient Instructions (Signed)
Change Carvedilol to 12.5 mg tabs, Take 1/2 tab (6.25 mg) in AM and 1 tab (12.5 mg) in PM  You are scheduled for a Right Heart Catheterization on Tuesday 02/17/14, see instruction sheet  Your physician recommends that you schedule a follow-up appointment in: 3 weeks

## 2014-02-10 NOTE — Progress Notes (Signed)
Patient ID: [name], male   DOB: 05-Dec-1940, 73 y.o.   MRN: [id number] PCP: Dr. Lynnda Child Primary Cardiologist: Dr. Radford Pax  73 yo with history of CAD s/p CABG and redo CABG as well as ischemic cardiomyopathy with CRT-D device presents for CHF clinic evaluation.  He had a Cardiolite in the 4/15 showing lateral wall ischemia.  LHC at that time showed all his vein grafts from both CABG surgeries occluded.  The LIMA-LAD was patent and there was a 90% distal LM stenosis.  The lateral ischemia likely corresponded to LCx territory downstream from the LM stenosis.  PCI of the distal LM was thought to be high risk and characteristics of the lesion were not favorable for PCI.  Last echo showed EF 25-30% with moderate RV systolic dysfunction.    BP has been soft.  It is 90/52 today.  He does get lightheaded with standing.  No falls.  He is currently taking Lasix 40 mg daily.  This was cut back from 40 mg bid.  He says that he has good days and bad days.  He can walk on flat ground without much problem.  He thinks he could walk about 1/2 mile.  He can climb 1 flight of steps without much difficulty. He is fatigued working in his garden.  He has occasional exertional chest pain.  He will note this every 2-3 days with heavier exertion.  He takes 1-2 NTGs/week.  This pattern is stable.   Optivol was reviewed: fluid index below threshold, impedance trending up.   Labs (9/15): LDL particle number 816, LDL 57, LFTs normal Labs (10/15): K 4.4, creatinine 1.4  PMH: 1. Gout 2. Hyperlipidemia 3. CAD: CABG 1997 and redo 11/12.  LHC (4/15) with totally occluded LAD, totally occluded RCA, 80% distal LM, 50% mLCx, 2 SVG-RCA grafts totally occluded, 2 SVG-OM grafts totally occluded, patent LIMA-LAD.  Cardiolite prior to 4/15 cath showed lateral wall ischemia (LCx territory).  PCI to distal LM would be a high risk procedure.  4. Ischemic Cardiomyopathy: [employer]onic CRT-D device.  Echo (6/15) with EF 25-30%, moderate LV  dilation, inferior and inferolateral akinesis, moderately decreased RV systolic function, mild MR.  5. H/o cholecystectomy 6. OA 7. Depression 8. Type II diabetes 9. GERD 10. CKD  SH: Married, prior smoker (many years ago), lives in [place].    FH: CAD  ROS: All systems reviewed and negative except as per HPI.   Current Outpatient Prescriptions  Medication Sig Dispense Refill  . allopurinol (ZYLOPRIM) 100 MG tablet Take 100 mg by mouth 2 (two) times daily.       Marland Kitchen aspirin EC 81 MG tablet Take 81 mg by mouth daily.      Marland Kitchen atorvastatin (LIPITOR) 40 MG tablet Take 40 mg by mouth daily at 6 PM.       . benazepril (LOTENSIN) 10 MG tablet Take 10 mg by mouth every morning.      . carvedilol (COREG) 12.5 MG tablet Take 1/2 tab in AM and 1 tab in PM  135 tablet  3  . clopidogrel (PLAVIX) 75 MG tablet Take 1 tablet (75 mg total) by mouth daily.  90 tablet  3  . eplerenone (INSPRA) 25 MG tablet Take 25 mg by mouth every morning.       . furosemide (LASIX) 40 MG tablet Take 1 tablet (40 mg total) by mouth daily.  30 tablet    . insulin lispro protamine-lispro (HUMALOG 50/50 MIX) (50-50) 100 UNIT/ML SUSP injection Inject  20-30 Units into the skin 3 (three) times daily. Takes 30 units in the morning 30 at lunch and 20 at night      . isosorbide mononitrate (IMDUR) 30 MG 24 hr tablet Take 30 mg by mouth at bedtime.      . niacin (NIASPAN) 500 MG CR tablet Take 500 mg by mouth 2 (two) times daily.       . nitroGLYCERIN (NITROSTAT) 0.4 MG SL tablet Place 1 tablet (0.4 mg total) under the tongue every 5 (five) minutes x 3 doses as needed. For chest pain.  25 tablet  3  . ranolazine (RANEXA) 1000 MG SR tablet Take 1 tablet (1,000 mg total) by mouth 2 (two) times daily.  180 tablet  3  . sertraline (ZOLOFT) 50 MG tablet Take 50 mg by mouth 2 (two) times daily.       Marland Kitchen venlafaxine (EFFEXOR-XR) 150 MG 24 hr capsule Take 150 mg by mouth at bedtime.       Marland Kitchen zolpidem (AMBIEN CR) 12.5 MG CR tablet Take 12.5  mg by mouth at bedtime as needed. For sleep       No current facility-administered medications for this encounter.   BP 90/52  Pulse 77  Ht 5\' 10"  (1.778 m)  Wt 210 lb 12.8 oz (95.618 kg)  BMI 30.25 kg/m2  SpO2 99% General: NAD Neck: No JVD, no thyromegaly or thyroid nodule.  Lungs: Clear to auscultation bilaterally with normal respiratory effort. CV: Nondisplaced PMI.  Heart regular S1/S2, no S3/S4, no murmur.  No peripheral edema.  No carotid bruit.  Normal pedal pulses.  Abdomen: Soft, nontender, no hepatosplenomegaly, no distention.  Skin: Intact without lesions or rashes.  Neurologic: Alert and oriented x 3.  Psych: Normal affect. Extremities: No clubbing or cyanosis.  HEENT: Normal.   Assessment/Plan: 1. Chronic systolic CHF: NYHA class II-III symptoms.  Fatigue seems more prominent than dyspnea.  EF 30-35% on last echo with moderate RV systolic dysfunction. He has a Medtronic CRT-D device.  I do not think that he looks volume overloaded on exam, and Optivol appears to confirm the lack of volume overload.  - I am concerned the fatigue may be due to low output.  I will arrange for RHC to assess this.   - Continue benazepril and eplerenone.  - He is currently taking Coreg 25 mg at night only.  He is most lightheaded in the morning.  I will have him change Coreg to 6.25 mg qam, 12.5 mg qpm.   - Continue current Lasix dosing.   2. CAD: s/p CABG and redo.  Most recently, LHC in 4/15 showed that all the SVGs were occluded but LIMA-LAD was patent.  Lateral wall ischemia on Cardiolite likely is explained by the 80% distal LM stenosis upstream from the LCx.  This was thought to be a difficult lesion for PCI and was managed medically.  Patient continues to have occasional angina that is stable.   - Continue ASA 81, Plavix, statin.  - He will continue Imdur and Ranexa at current doses.  3. CKD: Follow creatinine closely.  4. Hyperlipidemia: Excellent lipids in 9/15.   followup in 3 wks.    [name] 02/10/2014

## 2014-02-11 DIAGNOSIS — H16101 Unspecified superficial keratitis, right eye: Secondary | ICD-10-CM | POA: Diagnosis not present

## 2014-02-17 ENCOUNTER — Encounter (HOSPITAL_COMMUNITY)
Admission: RE | Disposition: A | Discharge status: Home / Self Care | Payer: Self-pay | Source: Ambulatory Visit | Attending: Cardiology

## 2014-02-17 ENCOUNTER — Ambulatory Visit (HOSPITAL_COMMUNITY)
Admission: RE | Admit: 2014-02-17 | Discharge: 2014-02-17 | Disposition: A | Discharge status: Home / Self Care | Payer: Managed Care, Other (non HMO) | Source: Ambulatory Visit | Attending: Cardiology | Admitting: Cardiology

## 2014-02-17 DIAGNOSIS — Z7902 Long term (current) use of antithrombotics/antiplatelets: Secondary | ICD-10-CM | POA: Insufficient documentation

## 2014-02-17 DIAGNOSIS — I2582 Chronic total occlusion of coronary artery: Secondary | ICD-10-CM | POA: Insufficient documentation

## 2014-02-17 DIAGNOSIS — Z87891 Personal history of nicotine dependence: Secondary | ICD-10-CM | POA: Diagnosis not present

## 2014-02-17 DIAGNOSIS — E119 Type 2 diabetes mellitus without complications: Secondary | ICD-10-CM | POA: Insufficient documentation

## 2014-02-17 DIAGNOSIS — N189 Chronic kidney disease, unspecified: Secondary | ICD-10-CM | POA: Diagnosis not present

## 2014-02-17 DIAGNOSIS — E785 Hyperlipidemia, unspecified: Secondary | ICD-10-CM | POA: Diagnosis not present

## 2014-02-17 DIAGNOSIS — I509 Heart failure, unspecified: Secondary | ICD-10-CM | POA: Diagnosis not present

## 2014-02-17 DIAGNOSIS — I5022 Chronic systolic (congestive) heart failure: Secondary | ICD-10-CM | POA: Insufficient documentation

## 2014-02-17 DIAGNOSIS — I2510 Atherosclerotic heart disease of native coronary artery without angina pectoris: Secondary | ICD-10-CM | POA: Insufficient documentation

## 2014-02-17 DIAGNOSIS — Z7982 Long term (current) use of aspirin: Secondary | ICD-10-CM | POA: Insufficient documentation

## 2014-02-17 DIAGNOSIS — Z951 Presence of aortocoronary bypass graft: Secondary | ICD-10-CM | POA: Diagnosis not present

## 2014-02-17 DIAGNOSIS — Z95810 Presence of automatic (implantable) cardiac defibrillator: Secondary | ICD-10-CM | POA: Diagnosis not present

## 2014-02-17 DIAGNOSIS — I255 Ischemic cardiomyopathy: Secondary | ICD-10-CM | POA: Insufficient documentation

## 2014-02-17 DIAGNOSIS — Z794 Long term (current) use of insulin: Secondary | ICD-10-CM | POA: Diagnosis not present

## 2014-02-17 DIAGNOSIS — K219 Gastro-esophageal reflux disease without esophagitis: Secondary | ICD-10-CM | POA: Diagnosis not present

## 2014-02-17 DIAGNOSIS — Z79899 Other long term (current) drug therapy: Secondary | ICD-10-CM | POA: Insufficient documentation

## 2014-02-17 HISTORY — PX: RIGHT HEART CATHETERIZATION: SHX5447

## 2014-02-17 LAB — BASIC METABOLIC PANEL
Anion gap: 14 (ref 5–15)
BUN: 30 mg/dL — AB (ref 6–23)
CHLORIDE: 97 meq/L (ref 96–112)
CO2: 26 mEq/L (ref 19–32)
Calcium: 9.7 mg/dL (ref 8.4–10.5)
Creatinine, Ser: 1.36 mg/dL — ABNORMAL HIGH (ref 0.50–1.35)
GFR calc non Af Amer: 51 mL/min — ABNORMAL LOW (ref >90)
GFR, EST AFRICAN AMERICAN: 59 mL/min — AB (ref >90)
Glucose, Bld: 225 mg/dL — ABNORMAL HIGH (ref 70–99)
POTASSIUM: 4.3 meq/L (ref 3.7–5.3)
SODIUM: 137 meq/L (ref 137–147)

## 2014-02-17 LAB — CBC
HCT: 42.5 % (ref 39.0–52.0)
HEMOGLOBIN: 14.2 g/dL (ref 13.0–17.0)
MCH: 31.5 pg (ref 26.0–34.0)
MCHC: 33.4 g/dL (ref 30.0–36.0)
MCV: 94.2 fL (ref 78.0–100.0)
Platelets: 206 10*3/uL (ref 150–400)
RBC: 4.51 MIL/uL (ref 4.22–5.81)
RDW: 14.2 % (ref 11.5–15.5)
WBC: 9.2 10*3/uL (ref 4.0–10.5)

## 2014-02-17 LAB — POCT I-STAT 3, VENOUS BLOOD GAS (G3P V)
Acid-Base Excess: 1 mmol/L (ref 0.0–2.0)
BICARBONATE: 26.3 meq/L — AB (ref 20.0–24.0)
O2 Saturation: 71 %
PCO2 VEN: 44.5 mmHg — AB (ref 45.0–50.0)
PO2 VEN: 39 mmHg (ref 30.0–45.0)
TCO2: 28 mmol/L (ref 0–100)
pH, Ven: 7.379 — ABNORMAL HIGH (ref 7.250–7.300)

## 2014-02-17 LAB — GLUCOSE, CAPILLARY
GLUCOSE-CAPILLARY: 211 mg/dL — AB (ref 70–99)
GLUCOSE-CAPILLARY: 199 mg/dL — AB (ref 70–99)

## 2014-02-17 LAB — POCT I-STAT 3, ART BLOOD GAS (G3+)
ACID-BASE EXCESS: 1 mmol/L (ref 0.0–2.0)
Bicarbonate: 25.6 mEq/L — ABNORMAL HIGH (ref 20.0–24.0)
O2 Saturation: 98 %
PH ART: 7.412 (ref 7.350–7.450)
PO2 ART: 101 mmHg — AB (ref 80.0–100.0)
TCO2: 27 mmol/L (ref 0–100)
pCO2 arterial: 40.2 mmHg (ref 35.0–45.0)

## 2014-02-17 LAB — PROTIME-INR
INR: 1.07 (ref 0.00–1.49)
Prothrombin Time: 14 seconds (ref 11.6–15.2)

## 2014-02-17 SURGERY — RIGHT HEART CATH
Anesthesia: LOCAL

## 2014-02-17 MED ORDER — ASPIRIN 81 MG PO CHEW
CHEWABLE_TABLET | ORAL | Status: AC
  Filled 2014-02-17: qty 1

## 2014-02-17 MED ORDER — SODIUM CHLORIDE 0.9 % IV SOLN: 250.0000 mL | INTRAVENOUS | Status: DC | PRN

## 2014-02-17 MED ORDER — ACETAMINOPHEN 325 MG PO TABS: 650.0000 mg | ORAL_TABLET | ORAL | Status: DC | PRN

## 2014-02-17 MED ORDER — LIDOCAINE HCL (PF) 1 % IJ SOLN
INTRAMUSCULAR | Status: AC
  Filled 2014-02-17: qty 30

## 2014-02-17 MED ORDER — HEPARIN (PORCINE) IN NACL 2-0.9 UNIT/ML-% IJ SOLN
INTRAMUSCULAR | Status: AC
  Filled 2014-02-17: qty 500

## 2014-02-17 MED ORDER — SODIUM CHLORIDE 0.9 % IV SOLN
INTRAVENOUS | Status: AC
Start: 2014-02-17 — End: 2014-02-17

## 2014-02-17 MED ORDER — FENTANYL CITRATE 0.05 MG/ML IJ SOLN
INTRAMUSCULAR | Status: AC
  Filled 2014-02-17: qty 2

## 2014-02-17 MED ORDER — ONDANSETRON HCL 4 MG/2ML IJ SOLN: 4.0000 mg | Freq: Four times a day (QID) | INTRAMUSCULAR | Status: DC | PRN

## 2014-02-17 MED ORDER — SODIUM CHLORIDE 0.9 % IJ SOLN: 3.0000 mL | Freq: Two times a day (BID) | INTRAMUSCULAR | Status: DC

## 2014-02-17 MED ORDER — SODIUM CHLORIDE 0.9 % IJ SOLN: 3.0000 mL | INTRAMUSCULAR | Status: DC | PRN

## 2014-02-17 MED ORDER — SODIUM CHLORIDE 0.9 % IV SOLN
INTRAVENOUS | Status: DC
  Administered 2014-02-17: 1000 mL via INTRAVENOUS

## 2014-02-17 MED ORDER — ASPIRIN 81 MG PO CHEW
81.0000 mg | CHEWABLE_TABLET | ORAL | Status: AC
  Administered 2014-02-17: 81 mg via ORAL

## 2014-02-17 MED ORDER — MIDAZOLAM HCL 2 MG/2ML IJ SOLN
INTRAMUSCULAR | Status: AC
  Filled 2014-02-17: qty 2

## 2014-02-17 NOTE — CV Procedure (Signed)
    Cardiac Catheterization Procedure Note  Name: [name] MRN: [id number] DOB: 1941/04/08  Procedure: Right Heart Cath  Indication: CHF, ?low output.    Procedural Details: The right groin was prepped, draped, and anesthetized with 1% lidocaine. Using the modified Seldinger technique a 7 French sheath was placed in the right femoral vein. A Swan-Ganz catheter was used for the right heart catheterization. Standard protocol was followed for recording of right heart pressures and sampling of oxygen saturations. Fick thermodilution cardiac output was calculated. There were no immediate procedural complications. The patient was transferred to the post catheterization recovery area for further monitoring.  Procedural Findings: Hemodynamics (mmHg) RA mean 5 RV 23/6 PA 23/6 PCWP mean 7  Oxygen saturations: PA 71% AO 98%  Cardiac Output (Fick) 5.41  Cardiac Index (Fick) 2.55  Cardiac Output (Thermo) 5.15 Cardiac Index (Thermo) 2.43   Final Conclusions:  Normal left and right heart filling pressures and relatively preserved cardiac output.  No changes to therapy, continue current meds.  Feels better with Coreg adjustment.   [name] 02/17/2014, 8:39 AM

## 2014-02-17 NOTE — Discharge Instructions (Signed)
Angiogram, Care After °Refer to this sheet in the next few weeks. These instructions provide you with information on caring for yourself after your procedure. Your health care provider may also give you more specific instructions. Your treatment has been planned according to current medical practices, but problems sometimes occur. Call your health care provider if you have any problems or questions after your procedure.  °WHAT TO EXPECT AFTER THE PROCEDURE °After your procedure, it is typical to have the following sensations: °· Minor discomfort or tenderness and a small bump at the catheter insertion site. The bump should usually decrease in size and tenderness within 1 to 2 weeks. °· Any bruising will usually fade within 2 to 4 weeks. °HOME CARE INSTRUCTIONS  °· You may need to keep taking blood thinners if they were prescribed for you. Take medicines only as directed by your health care provider. °· Do not apply powder or lotion to the site. °· Do not take baths, swim, or use a hot tub until your health care provider approves. °· You may shower 24 hours after the procedure. Remove the bandage (dressing) and gently wash the site with plain soap and water. Gently pat the site dry. °· Inspect the site at least twice daily. °· Limit your activity for the first 48 hours. Do not bend, squat, or lift anything over 20 lb (9 kg) or as directed by your health care provider. °· Plan to have someone take you home after the procedure. Follow instructions about when you can drive or return to work. °SEEK MEDICAL CARE IF: °· You get light-headed when standing up. °· You have drainage (other than a small amount of blood on the dressing). °· You have chills. °· You have a fever. °· You have redness, warmth, swelling, or pain at the insertion site. °SEEK IMMEDIATE MEDICAL CARE IF:  °· You develop chest pain or shortness of breath, feel faint, or pass out. °· You have bleeding, swelling larger than a walnut, or drainage from the  catheter insertion site. °· You develop pain, discoloration, coldness, or severe bruising in the leg or arm that held the catheter. °· You have heavy bleeding from the site. If this happens, hold pressure on the site and call 911. °MAKE SURE YOU: °· Understand these instructions. °· Will watch your condition. °· Will get help right away if you are not doing well or get worse. °Document Released: 10/20/2004 Document Revised: 08/18/2013 Document Reviewed: 08/26/2012 °ExitCare® Patient Information ©2015 [employer]. This information is not intended to replace advice given to you by your health care provider. Make sure you discuss any questions you have with your health care provider. ° °

## 2014-02-17 NOTE — Interval H&P Note (Signed)
History and Physical Interval Note:  02/17/2014 7:59 AM  [name]  has presented today for surgery, with the diagnosis of CHF  The various methods of treatment have been discussed with the patient and family. After consideration of risks, benefits and other options for treatment, the patient has consented to  Procedure(s): RIGHT HEART CATH (N/A) as a surgical intervention .  The patient's history has been reviewed, patient examined, no change in status, stable for surgery.  I have reviewed the patient's chart and labs.  Questions were answered to the patient's satisfaction.     [name]

## 2014-02-17 NOTE — H&P (View-Only) (Signed)
Patient ID: [name], male   DOB: 10/07/1940, 73 y.o.   MRN: [id number] PCP: Dr. Lynnda Child Primary Cardiologist: Dr. Radford Pax  73 yo with history of CAD s/p CABG and redo CABG as well as ischemic cardiomyopathy with CRT-D device presents for CHF clinic evaluation.  He had a Cardiolite in the 4/15 showing lateral wall ischemia.  LHC at that time showed all his vein grafts from both CABG surgeries occluded.  The LIMA-LAD was patent and there was a 90% distal LM stenosis.  The lateral ischemia likely corresponded to LCx territory downstream from the LM stenosis.  PCI of the distal LM was thought to be high risk and characteristics of the lesion were not favorable for PCI.  Last echo showed EF 25-30% with moderate RV systolic dysfunction.    BP has been soft.  It is 90/52 today.  He does get lightheaded with standing.  No falls.  He is currently taking Lasix 40 mg daily.  This was cut back from 40 mg bid.  He says that he has good days and bad days.  He can walk on flat ground without much problem.  He thinks he could walk about 1/2 mile.  He can climb 1 flight of steps without much difficulty. He is fatigued working in his garden.  He has occasional exertional chest pain.  He will note this every 2-3 days with heavier exertion.  He takes 1-2 NTGs/week.  This pattern is stable.   Optivol was reviewed: fluid index below threshold, impedance trending up.   Labs (9/15): LDL particle number 816, LDL 57, LFTs normal Labs (10/15): K 4.4, creatinine 1.4  PMH: 1. Gout 2. Hyperlipidemia 3. CAD: CABG 1997 and redo 11/12.  LHC (4/15) with totally occluded LAD, totally occluded RCA, 80% distal LM, 50% mLCx, 2 SVG-RCA grafts totally occluded, 2 SVG-OM grafts totally occluded, patent LIMA-LAD.  Cardiolite prior to 4/15 cath showed lateral wall ischemia (LCx territory).  PCI to distal LM would be a high risk procedure.  4. Ischemic Cardiomyopathy: Medtronic CRT-D device.  Echo (6/15) with EF 25-30%, moderate LV  dilation, inferior and inferolateral akinesis, moderately decreased RV systolic function, mild MR.  5. H/o cholecystectomy 6. OA 7. Depression 8. Type II diabetes 9. GERD 10. CKD  SH: Married, prior smoker (many years ago), lives in [place].    FH: CAD  ROS: All systems reviewed and negative except as per HPI.   Current Outpatient Prescriptions  Medication Sig Dispense Refill  . allopurinol (ZYLOPRIM) 100 MG tablet Take 100 mg by mouth 2 (two) times daily.       Marland Kitchen aspirin EC 81 MG tablet Take 81 mg by mouth daily.      Marland Kitchen atorvastatin (LIPITOR) 40 MG tablet Take 40 mg by mouth daily at 6 PM.       . benazepril (LOTENSIN) 10 MG tablet Take 10 mg by mouth every morning.      . carvedilol (COREG) 12.5 MG tablet Take 1/2 tab in AM and 1 tab in PM  135 tablet  3  . clopidogrel (PLAVIX) 75 MG tablet Take 1 tablet (75 mg total) by mouth daily.  90 tablet  3  . eplerenone (INSPRA) 25 MG tablet Take 25 mg by mouth every morning.       . furosemide (LASIX) 40 MG tablet Take 1 tablet (40 mg total) by mouth daily.  30 tablet    . insulin lispro protamine-lispro (HUMALOG 50/50 MIX) (50-50) 100 UNIT/ML SUSP injection Inject  20-30 Units into the skin 3 (three) times daily. Takes 30 units in the morning 30 at lunch and 20 at night      . isosorbide mononitrate (IMDUR) 30 MG 24 hr tablet Take 30 mg by mouth at bedtime.      . niacin (NIASPAN) 500 MG CR tablet Take 500 mg by mouth 2 (two) times daily.       . nitroGLYCERIN (NITROSTAT) 0.4 MG SL tablet Place 1 tablet (0.4 mg total) under the tongue every 5 (five) minutes x 3 doses as needed. For chest pain.  25 tablet  3  . ranolazine (RANEXA) 1000 MG SR tablet Take 1 tablet (1,000 mg total) by mouth 2 (two) times daily.  180 tablet  3  . sertraline (ZOLOFT) 50 MG tablet Take 50 mg by mouth 2 (two) times daily.       Marland Kitchen venlafaxine (EFFEXOR-XR) 150 MG 24 hr capsule Take 150 mg by mouth at bedtime.       Marland Kitchen zolpidem (AMBIEN CR) 12.5 MG CR tablet Take 12.5  mg by mouth at bedtime as needed. For sleep       No current facility-administered medications for this encounter.   BP 90/52  Pulse 77  Ht 5\' 10"  (1.778 m)  Wt 210 lb 12.8 oz (95.618 kg)  BMI 30.25 kg/m2  SpO2 99% General: NAD Neck: No JVD, no thyromegaly or thyroid nodule.  Lungs: Clear to auscultation bilaterally with normal respiratory effort. CV: Nondisplaced PMI.  Heart regular S1/S2, no S3/S4, no murmur.  No peripheral edema.  No carotid bruit.  Normal pedal pulses.  Abdomen: Soft, nontender, no hepatosplenomegaly, no distention.  Skin: Intact without lesions or rashes.  Neurologic: Alert and oriented x 3.  Psych: Normal affect. Extremities: No clubbing or cyanosis.  HEENT: Normal.   Assessment/Plan: 1. Chronic systolic CHF: NYHA class II-III symptoms.  Fatigue seems more prominent than dyspnea.  EF 30-35% on last echo with moderate RV systolic dysfunction. He has a Medtronic CRT-D device.  I do not think that he looks volume overloaded on exam, and Optivol appears to confirm the lack of volume overload.  - I am concerned the fatigue may be due to low output.  I will arrange for RHC to assess this.   - Continue benazepril and eplerenone.  - He is currently taking Coreg 25 mg at night only.  He is most lightheaded in the morning.  I will have him change Coreg to 6.25 mg qam, 12.5 mg qpm.   - Continue current Lasix dosing.   2. CAD: s/p CABG and redo.  Most recently, LHC in 4/15 showed that all the SVGs were occluded but LIMA-LAD was patent.  Lateral wall ischemia on Cardiolite likely is explained by the 80% distal LM stenosis upstream from the LCx.  This was thought to be a difficult lesion for PCI and was managed medically.  Patient continues to have occasional angina that is stable.   - Continue ASA 81, Plavix, statin.  - He will continue Imdur and Ranexa at current doses.  3. CKD: Follow creatinine closely.  4. Hyperlipidemia: Excellent lipids in 9/15.   followup in 3 wks.    [name] 02/10/2014

## 2014-02-17 NOTE — Progress Notes (Signed)
Site area: RFV Site Prior to Removal:  Level 0 Pressure Applied For:10 min Manual:   yes Patient Status During Pull:  stable Post Pull Site:  Level 0 Post Pull Instructions Given:  yes Post Pull Pulses Present:  Dressing Applied:  clear Bedrest begins @ 1610 Comments:no complications

## 2014-02-26 ENCOUNTER — Encounter: Payer: Self-pay | Admitting: Cardiology

## 2014-03-04 ENCOUNTER — Ambulatory Visit (HOSPITAL_COMMUNITY)
Admission: RE | Admit: 2014-03-04 | Discharge: 2014-03-04 | Disposition: A | Discharge status: Home / Self Care | Payer: Managed Care, Other (non HMO) | Source: Ambulatory Visit | Attending: Cardiology | Admitting: Cardiology

## 2014-03-04 VITALS — BP 130/60 | HR 84 | Resp 18 | Wt 211.0 lb

## 2014-03-04 DIAGNOSIS — Z95810 Presence of automatic (implantable) cardiac defibrillator: Secondary | ICD-10-CM | POA: Diagnosis not present

## 2014-03-04 DIAGNOSIS — I5022 Chronic systolic (congestive) heart failure: Secondary | ICD-10-CM

## 2014-03-04 DIAGNOSIS — M109 Gout, unspecified: Secondary | ICD-10-CM | POA: Insufficient documentation

## 2014-03-04 DIAGNOSIS — N189 Chronic kidney disease, unspecified: Secondary | ICD-10-CM | POA: Diagnosis not present

## 2014-03-04 DIAGNOSIS — I255 Ischemic cardiomyopathy: Secondary | ICD-10-CM | POA: Diagnosis not present

## 2014-03-04 DIAGNOSIS — Z7901 Long term (current) use of anticoagulants: Secondary | ICD-10-CM | POA: Insufficient documentation

## 2014-03-04 DIAGNOSIS — Z79899 Other long term (current) drug therapy: Secondary | ICD-10-CM | POA: Diagnosis not present

## 2014-03-04 DIAGNOSIS — E785 Hyperlipidemia, unspecified: Secondary | ICD-10-CM | POA: Insufficient documentation

## 2014-03-04 DIAGNOSIS — Z794 Long term (current) use of insulin: Secondary | ICD-10-CM | POA: Diagnosis not present

## 2014-03-04 DIAGNOSIS — Z9049 Acquired absence of other specified parts of digestive tract: Secondary | ICD-10-CM | POA: Insufficient documentation

## 2014-03-04 DIAGNOSIS — Z951 Presence of aortocoronary bypass graft: Secondary | ICD-10-CM | POA: Insufficient documentation

## 2014-03-04 DIAGNOSIS — E119 Type 2 diabetes mellitus without complications: Secondary | ICD-10-CM | POA: Diagnosis not present

## 2014-03-04 DIAGNOSIS — I2582 Chronic total occlusion of coronary artery: Secondary | ICD-10-CM | POA: Diagnosis not present

## 2014-03-04 DIAGNOSIS — Z87891 Personal history of nicotine dependence: Secondary | ICD-10-CM | POA: Diagnosis not present

## 2014-03-04 DIAGNOSIS — I2510 Atherosclerotic heart disease of native coronary artery without angina pectoris: Secondary | ICD-10-CM | POA: Diagnosis not present

## 2014-03-04 DIAGNOSIS — F329 Major depressive disorder, single episode, unspecified: Secondary | ICD-10-CM | POA: Diagnosis not present

## 2014-03-04 DIAGNOSIS — Z7982 Long term (current) use of aspirin: Secondary | ICD-10-CM | POA: Insufficient documentation

## 2014-03-04 DIAGNOSIS — K219 Gastro-esophageal reflux disease without esophagitis: Secondary | ICD-10-CM | POA: Diagnosis not present

## 2014-03-04 MED ORDER — CARVEDILOL 12.5 MG PO TABS
ORAL_TABLET | ORAL | Status: DC
Start: 2014-03-04 — End: 2014-08-21

## 2014-03-04 MED ORDER — SACUBITRIL-VALSARTAN 24-26 MG PO TABS: 1.0000 | ORAL_TABLET | Freq: Two times a day (BID) | ORAL | Status: DC

## 2014-03-04 NOTE — Patient Instructions (Signed)
Stop Benazepril  Start Entresto 24/26 mg Twice daily starting on Thursday PM  Labs in 2 weeks (bmet)  Your physician recommends that you schedule a follow-up appointment in: 3 months

## 2014-03-04 NOTE — Progress Notes (Signed)
PaPatient ID: [name], male   DOB: 1941-01-26, 73 y.o.   MRN: [id number] PCP: Dr. Lynnda Child Primary Cardiologist: Dr. Radford Pax  73 yo with history of CAD s/p CABG and redo CABG as well as ischemic cardiomyopathy with CRT-D device presents for CHF clinic evaluation.  He had a Cardiolite in the 4/15 showing lateral wall ischemia.  LHC at that time showed all his vein grafts from both CABG surgeries occluded.  The LIMA-LAD was patent and there was a 90% distal LM stenosis.  The lateral ischemia likely corresponded to LCx territory downstream from the LM stenosis.  PCI of the distal LM was thought to be high risk and characteristics of the lesion were not favorable for PCI.  Last echo showed EF 25-30% with moderate RV systolic dysfunction.  He had RHC in 11/15 with normal filling pressures and relatively preserved cardiac index (2.55).    He has been doing well recently.  At last appointment, I adjusted his Coreg dosing.  This has helped with his fatigue.  He has no exertional chest pain.  He can walk 1/2 mile without problems.  He mows his grass and does yardwork.  Dyspnea only with heavy exertion.  No orthopnea, PND, or bendopnea.    Labs (9/15): LDL particle number 816, LDL 57, LFTs normal Labs (10/15): K 4.4, creatinine 1.4 Labs (11/15): K 4.3, creatinine 1.36  PMH: 1. Gout 2. Hyperlipidemia 3. CAD: CABG 1997 and redo 11/12.  LHC (4/15) with totally occluded LAD, totally occluded RCA, 80% distal LM, 50% mLCx, 2 SVG-RCA grafts totally occluded, 2 SVG-OM grafts totally occluded, patent LIMA-LAD.  Cardiolite prior to 4/15 cath showed lateral wall ischemia (LCx territory).  PCI to distal LM would be a high risk procedure.  4. Ischemic Cardiomyopathy: Medtronic CRT-D device.  Echo (6/15) with EF 25-30%, moderate LV dilation, inferior and inferolateral akinesis, moderately decreased RV systolic function, mild MR.  RHC (11/15) with mean RA 5, PA 23/6, mean PCWP 9, CI 2.55.  5. H/o cholecystectomy 6.  OA 7. Depression 8. Type II diabetes 9. GERD 10. CKD  SH: Married, prior smoker (many years ago), lives in [place].    FH: CAD  ROS: All systems reviewed and negative except as per HPI.   Current Outpatient Prescriptions  Medication Sig Dispense Refill  . allopurinol (ZYLOPRIM) 100 MG tablet Take 100 mg by mouth 2 (two) times daily.     Marland Kitchen aspirin EC 81 MG tablet Take 81 mg by mouth daily.    Marland Kitchen atorvastatin (LIPITOR) 40 MG tablet Take 40 mg by mouth daily at 6 PM.     . carvedilol (COREG) 12.5 MG tablet 6.25 mg  in the am & 12.5 mg in the pm 135 tablet 3  . clopidogrel (PLAVIX) 75 MG tablet Take 1 tablet (75 mg total) by mouth daily. 90 tablet 3  . eplerenone (INSPRA) 25 MG tablet Take 25 mg by mouth every morning.     . furosemide (LASIX) 40 MG tablet Take 1 tablet (40 mg total) by mouth daily. 30 tablet   . insulin lispro protamine-lispro (HUMALOG 50/50 MIX) (50-50) 100 UNIT/ML SUSP injection Inject 20-30 Units into the skin 3 (three) times daily. Takes 30 units in the morning 30 at lunch and 20 at night    . isosorbide mononitrate (IMDUR) 30 MG 24 hr tablet Take 30 mg by mouth at bedtime.    . niacin (NIASPAN) 500 MG CR tablet Take 500 mg by mouth 2 (two) times daily.     Marland Kitchen  nitroGLYCERIN (NITROSTAT) 0.4 MG SL tablet Place 1 tablet (0.4 mg total) under the tongue every 5 (five) minutes x 3 doses as needed. For chest pain. 25 tablet 3  . ranolazine (RANEXA) 1000 MG SR tablet Take 1 tablet (1,000 mg total) by mouth 2 (two) times daily. 180 tablet 3  . sertraline (ZOLOFT) 50 MG tablet Take 50 mg by mouth 2 (two) times daily.     Marland Kitchen venlafaxine (EFFEXOR-XR) 150 MG 24 hr capsule Take 150 mg by mouth at bedtime.     Marland Kitchen zolpidem (AMBIEN CR) 12.5 MG CR tablet Take 12.5 mg by mouth at bedtime as needed. For sleep    . Sacubitril-Valsartan (ENTRESTO) 24-26 MG TABS Take 1 tablet by mouth 2 (two) times daily. 60 tablet 3   No current facility-administered medications for this encounter.   BP  130/60 mmHg  Pulse 84  Resp 18  Wt 211 lb (95.709 kg)  SpO2 97% General: NAD Neck: No JVD, no thyromegaly or thyroid nodule.  Lungs: Clear to auscultation bilaterally with normal respiratory effort. CV: Nondisplaced PMI.  Heart regular S1/S2, no S3/S4, no murmur.  No peripheral edema.  No carotid bruit.  Normal pedal pulses.  Abdomen: Soft, nontender, no hepatosplenomegaly, no distention.  Skin: Intact without lesions or rashes.  Neurologic: Alert and oriented x 3.  Psych: Normal affect. Extremities: No clubbing or cyanosis.   Assessment/Plan: 1. Chronic systolic CHF: NYHA class II symptoms, improved.  EF 30-35% on last echo with moderate RV systolic dysfunction. He has a Medtronic CRT-D device.  Normal filling pressures and preserved cardiac index on RHC in 11/15.   - Continue current Coreg, eplerenone, and Lasix.   - Stop benazepril and start Entresto 24/26 bid. BMET in 2 wks.  2. CAD: s/p CABG and redo.  Most recently, LHC in 4/15 showed that all the SVGs were occluded but LIMA-LAD was patent.  Lateral wall ischemia on Cardiolite likely is explained by the 80% distal LM stenosis upstream from the LCx.  This was thought to be a difficult lesion for PCI and was managed medically.  No significant exertional chest pain.   - Continue ASA 81, Plavix, statin.  - He will continue Imdur and Ranexa at current doses.  3. CKD: Follow creatinine closely.  4. Hyperlipidemia: Excellent lipids in 9/15.   followup in 3 months.   [name] 03/04/2014

## 2014-03-06 ENCOUNTER — Other Ambulatory Visit (HOSPITAL_COMMUNITY): Payer: Self-pay | Admitting: Vascular Surgery

## 2014-03-06 NOTE — Telephone Encounter (Signed)
As requested rx sent into local pharmacy

## 2014-03-06 NOTE — Telephone Encounter (Signed)
Pt called about the prescription for Entrusto to be called in to [facility]

## 2014-03-09 ENCOUNTER — Other Ambulatory Visit (HOSPITAL_COMMUNITY): Payer: Self-pay | Admitting: *Deleted

## 2014-03-09 ENCOUNTER — Telehealth (HOSPITAL_COMMUNITY): Payer: Self-pay | Admitting: Vascular Surgery

## 2014-03-09 MED ORDER — NITROGLYCERIN 0.4 MG SL SUBL: 0.4000 mg | SUBLINGUAL_TABLET | SUBLINGUAL | Status: DC | PRN

## 2014-03-09 MED ORDER — SACUBITRIL-VALSARTAN 24-26 MG PO TABS: 1.0000 | ORAL_TABLET | Freq: Two times a day (BID) | ORAL | Status: DC

## 2014-03-09 NOTE — Telephone Encounter (Signed)
Addressed by other means  

## 2014-03-09 NOTE — Telephone Encounter (Signed)
Wife called pt need refill Nitrostat

## 2014-03-17 ENCOUNTER — Ambulatory Visit (HOSPITAL_COMMUNITY)
Admission: RE | Admit: 2014-03-17 | Discharge: 2014-03-17 | Disposition: A | Discharge status: Home / Self Care | Payer: Managed Care, Other (non HMO) | Source: Ambulatory Visit | Attending: Cardiology | Admitting: Cardiology

## 2014-03-17 DIAGNOSIS — I5022 Chronic systolic (congestive) heart failure: Secondary | ICD-10-CM | POA: Diagnosis not present

## 2014-03-17 LAB — BASIC METABOLIC PANEL
Anion gap: 15 (ref 5–15)
BUN: 36 mg/dL — AB (ref 6–23)
CHLORIDE: 100 meq/L (ref 96–112)
CO2: 22 mEq/L (ref 19–32)
CREATININE: 1.47 mg/dL — AB (ref 0.50–1.35)
Calcium: 8.7 mg/dL (ref 8.4–10.5)
GFR calc Af Amer: 54 mL/min — ABNORMAL LOW (ref >90)
GFR calc non Af Amer: 46 mL/min — ABNORMAL LOW (ref >90)
GLUCOSE: 298 mg/dL — AB (ref 70–99)
Potassium: 4.7 mEq/L (ref 3.7–5.3)
Sodium: 137 mEq/L (ref 137–147)

## 2014-03-19 ENCOUNTER — Telehealth (HOSPITAL_COMMUNITY): Payer: Self-pay

## 2014-03-19 MED ORDER — FUROSEMIDE 40 MG PO TABS: ORAL_TABLET | ORAL | Status: DC

## 2014-03-19 NOTE — Telephone Encounter (Signed)
Per Dr. Aundra Dubin, advised patient to cut back lasix to 40mg  one day, 20mg  the next day, and alternate as such continuously.  Also added on for repeat BMET in 3 weeks.  Patient aware and agreeable and write on lasix bottle new instructions while on the phone.  Rx updated to preferred pharmacy electronically.  Renee Pain

## 2014-03-26 ENCOUNTER — Encounter (HOSPITAL_COMMUNITY): Payer: Self-pay | Admitting: Cardiology

## 2014-04-07 ENCOUNTER — Ambulatory Visit (HOSPITAL_COMMUNITY)
Admission: RE | Admit: 2014-04-07 | Discharge: 2014-04-07 | Disposition: A | Discharge status: Home / Self Care | Payer: Managed Care, Other (non HMO) | Source: Ambulatory Visit | Attending: Internal Medicine | Admitting: Internal Medicine

## 2014-04-07 DIAGNOSIS — I5022 Chronic systolic (congestive) heart failure: Secondary | ICD-10-CM | POA: Diagnosis not present

## 2014-04-07 LAB — BASIC METABOLIC PANEL
Anion gap: 15 (ref 5–15)
BUN: 29 mg/dL — AB (ref 6–23)
CHLORIDE: 100 meq/L (ref 96–112)
CO2: 23 mmol/L (ref 19–32)
Calcium: 9.4 mg/dL (ref 8.4–10.5)
Creatinine, Ser: 1.34 mg/dL (ref 0.50–1.35)
GFR calc Af Amer: 60 mL/min — ABNORMAL LOW (ref >90)
GFR calc non Af Amer: 52 mL/min — ABNORMAL LOW (ref >90)
GLUCOSE: 221 mg/dL — AB (ref 70–99)
Potassium: 4.8 mmol/L (ref 3.5–5.1)
Sodium: 138 mmol/L (ref 135–145)

## 2014-04-13 ENCOUNTER — Ambulatory Visit (INDEPENDENT_AMBULATORY_CARE_PROVIDER_SITE_OTHER): Payer: Managed Care, Other (non HMO) | Admitting: *Deleted

## 2014-04-13 ENCOUNTER — Encounter: Payer: Self-pay | Admitting: Internal Medicine

## 2014-04-13 DIAGNOSIS — I5022 Chronic systolic (congestive) heart failure: Secondary | ICD-10-CM

## 2014-04-13 DIAGNOSIS — I255 Ischemic cardiomyopathy: Secondary | ICD-10-CM

## 2014-04-13 DIAGNOSIS — K8010 Calculus of gallbladder with chronic cholecystitis without obstruction: Secondary | ICD-10-CM | POA: Diagnosis not present

## 2014-04-13 LAB — MDC_IDC_ENUM_SESS_TYPE_REMOTE
Battery Voltage: 3.08 V
Brady Statistic AP VP Percent: 0.36 %
Brady Statistic AS VP Percent: 98.62 %
Brady Statistic RA Percent Paced: 0.37 %
Date Time Interrogation Session: 20151228145209
HIGH POWER IMPEDANCE MEASURED VALUE: 513 Ohm
HighPow Impedance: 44 Ohm
HighPow Impedance: 56 Ohm
Lead Channel Impedance Value: 418 Ohm
Lead Channel Impedance Value: 456 Ohm
Lead Channel Pacing Threshold Amplitude: 0.625 V
Lead Channel Pacing Threshold Amplitude: 1.125 V
Lead Channel Pacing Threshold Pulse Width: 0.4 ms
Lead Channel Pacing Threshold Pulse Width: 0.4 ms
Lead Channel Sensing Intrinsic Amplitude: 31.625 mV
Lead Channel Setting Pacing Amplitude: 2 V
Lead Channel Setting Pacing Amplitude: 2 V
Lead Channel Setting Pacing Amplitude: 2.5 V
Lead Channel Setting Pacing Pulse Width: 0.4 ms
MDC IDC MSMT LEADCHNL LV IMPEDANCE VALUE: 399 Ohm
MDC IDC MSMT LEADCHNL LV IMPEDANCE VALUE: 665 Ohm
MDC IDC MSMT LEADCHNL LV PACING THRESHOLD AMPLITUDE: 0.875 V
MDC IDC MSMT LEADCHNL RA PACING THRESHOLD PULSEWIDTH: 0.4 ms
MDC IDC MSMT LEADCHNL RA SENSING INTR AMPL: 1.5 mV
MDC IDC MSMT LEADCHNL RV IMPEDANCE VALUE: 608 Ohm
MDC IDC SET LEADCHNL RV PACING PULSEWIDTH: 0.4 ms
MDC IDC SET LEADCHNL RV SENSING SENSITIVITY: 0.3 mV
MDC IDC STAT BRADY AP VS PERCENT: 0.01 %
MDC IDC STAT BRADY AS VS PERCENT: 1.02 %
MDC IDC STAT BRADY RV PERCENT PACED: 98.97 %
Zone Setting Detection Interval: 300 ms
Zone Setting Detection Interval: 350 ms
Zone Setting Detection Interval: 350 ms
Zone Setting Detection Interval: 450 ms

## 2014-04-13 NOTE — Progress Notes (Signed)
Remote defibrillator check.  

## 2014-04-20 DIAGNOSIS — H905 Unspecified sensorineural hearing loss: Secondary | ICD-10-CM | POA: Diagnosis not present

## 2014-04-20 DIAGNOSIS — H9313 Tinnitus, bilateral: Secondary | ICD-10-CM | POA: Diagnosis not present

## 2014-04-24 ENCOUNTER — Encounter: Payer: Self-pay | Admitting: *Deleted

## 2014-06-08 ENCOUNTER — Ambulatory Visit (HOSPITAL_COMMUNITY)
Admission: RE | Admit: 2014-06-08 | Discharge: 2014-06-08 | Disposition: A | Discharge status: Home / Self Care | Payer: Managed Care, Other (non HMO) | Source: Ambulatory Visit | Attending: Internal Medicine | Admitting: Internal Medicine

## 2014-06-08 VITALS — BP 112/58 | HR 87 | Wt 216.2 lb

## 2014-06-08 DIAGNOSIS — I2510 Atherosclerotic heart disease of native coronary artery without angina pectoris: Secondary | ICD-10-CM | POA: Insufficient documentation

## 2014-06-08 DIAGNOSIS — I255 Ischemic cardiomyopathy: Secondary | ICD-10-CM | POA: Insufficient documentation

## 2014-06-08 DIAGNOSIS — Z87891 Personal history of nicotine dependence: Secondary | ICD-10-CM | POA: Insufficient documentation

## 2014-06-08 DIAGNOSIS — Z951 Presence of aortocoronary bypass graft: Secondary | ICD-10-CM | POA: Diagnosis not present

## 2014-06-08 DIAGNOSIS — K219 Gastro-esophageal reflux disease without esophagitis: Secondary | ICD-10-CM | POA: Insufficient documentation

## 2014-06-08 DIAGNOSIS — N189 Chronic kidney disease, unspecified: Secondary | ICD-10-CM | POA: Diagnosis not present

## 2014-06-08 DIAGNOSIS — Z7982 Long term (current) use of aspirin: Secondary | ICD-10-CM | POA: Insufficient documentation

## 2014-06-08 DIAGNOSIS — I5022 Chronic systolic (congestive) heart failure: Secondary | ICD-10-CM | POA: Diagnosis not present

## 2014-06-08 DIAGNOSIS — Z794 Long term (current) use of insulin: Secondary | ICD-10-CM | POA: Diagnosis not present

## 2014-06-08 DIAGNOSIS — E785 Hyperlipidemia, unspecified: Secondary | ICD-10-CM | POA: Insufficient documentation

## 2014-06-08 DIAGNOSIS — F329 Major depressive disorder, single episode, unspecified: Secondary | ICD-10-CM | POA: Diagnosis not present

## 2014-06-08 DIAGNOSIS — Z79899 Other long term (current) drug therapy: Secondary | ICD-10-CM | POA: Diagnosis not present

## 2014-06-08 DIAGNOSIS — M109 Gout, unspecified: Secondary | ICD-10-CM | POA: Diagnosis not present

## 2014-06-08 DIAGNOSIS — Z7902 Long term (current) use of antithrombotics/antiplatelets: Secondary | ICD-10-CM | POA: Diagnosis not present

## 2014-06-08 DIAGNOSIS — E119 Type 2 diabetes mellitus without complications: Secondary | ICD-10-CM | POA: Insufficient documentation

## 2014-06-08 LAB — BASIC METABOLIC PANEL
Anion gap: 12 (ref 5–15)
BUN: 20 mg/dL (ref 6–23)
CALCIUM: 9.3 mg/dL (ref 8.4–10.5)
CO2: 26 mmol/L (ref 19–32)
CREATININE: 1.32 mg/dL (ref 0.50–1.35)
Chloride: 99 mmol/L (ref 96–112)
GFR calc non Af Amer: 53 mL/min — ABNORMAL LOW (ref >90)
GFR, EST AFRICAN AMERICAN: 61 mL/min — AB (ref >90)
GLUCOSE: 331 mg/dL — AB (ref 70–99)
Potassium: 4.6 mmol/L (ref 3.5–5.1)
Sodium: 137 mmol/L (ref 135–145)

## 2014-06-08 LAB — BRAIN NATRIURETIC PEPTIDE: B Natriuretic Peptide: 324 pg/mL — ABNORMAL HIGH (ref 0.0–100.0)

## 2014-06-08 MED ORDER — SACUBITRIL-VALSARTAN 49-51 MG PO TABS: 1.0000 | ORAL_TABLET | Freq: Two times a day (BID) | ORAL | Status: DC

## 2014-06-08 NOTE — Progress Notes (Signed)
Patient ID: [name], male   DOB: 01/02/1941, 74 y.o.   MRN: [id number] PCP: Dr. Lynnda Child Primary Cardiologist: Dr. Radford Pax  74 yo with history of CAD s/p CABG and redo CABG as well as ischemic cardiomyopathy with CRT-D device presents for CHF clinic evaluation.  He had a Cardiolite in the 4/15 showing lateral wall ischemia.  LHC at that time showed all his vein grafts from both CABG surgeries occluded.  The LIMA-LAD was patent and there was a 90% distal LM stenosis.  The lateral ischemia likely corresponded to LCx territory downstream from the LM stenosis.  PCI of the distal LM was thought to be high risk and characteristics of the lesion were not favorable for PCI.  Last echo showed EF 25-30% with moderate RV systolic dysfunction.  He had RHC in 11/15 with normal filling pressures and relatively preserved cardiac index (2.55).    He has been doing well recently.  He has liked the change to [facility] and thinks that he exercise capacity is improving. He can walk 1/2 mile without problems.  He goes to the gym 3 days/week. Dyspnea only with heavy exertion.  No orthopnea, PND, or bendopnea.  Rare chest pain, only when he is feeling emotionally stressed (not exertional).   Optivol was checked today.  1-2 hours activity daily.  Fluid index < threshold, stable impedance.   Labs (9/15): LDL particle number 816, LDL 57, LFTs normal Labs (10/15): K 4.4, creatinine 1.4 Labs (11/15): K 4.3, creatinine 1.36 Labs (12/15): K 4.8, creatinine 1.34  PMH: 1. Gout 2. Hyperlipidemia 3. CAD: CABG 1997 and redo 11/12.  LHC (4/15) with totally occluded LAD, totally occluded RCA, 80% distal LM, 50% mLCx, 2 SVG-RCA grafts totally occluded, 2 SVG-OM grafts totally occluded, patent LIMA-LAD.  Cardiolite prior to 4/15 cath showed lateral wall ischemia (LCx territory).  PCI to distal LM would be a high risk procedure.  4. Ischemic Cardiomyopathy: Medtronic CRT-D device.  Echo (6/15) with EF 25-30%, moderate LV dilation,  inferior and inferolateral akinesis, moderately decreased RV systolic function, mild MR.  RHC (11/15) with mean RA 5, PA 23/6, mean PCWP 9, CI 2.55.  5. H/o cholecystectomy 6. OA 7. Depression 8. Type II diabetes 9. GERD 10. CKD  SH: Married, prior smoker (many years ago), lives in [place].    FH: CAD  ROS: All systems reviewed and negative except as per HPI.   Current Outpatient Prescriptions  Medication Sig Dispense Refill  . allopurinol (ZYLOPRIM) 100 MG tablet Take 100 mg by mouth 2 (two) times daily.     Marland Kitchen aspirin EC 81 MG tablet Take 81 mg by mouth daily.    Marland Kitchen atorvastatin (LIPITOR) 40 MG tablet Take 40 mg by mouth daily at 6 PM.     . carvedilol (COREG) 12.5 MG tablet 6.25 mg  in the am & 12.5 mg in the pm 135 tablet 3  . clopidogrel (PLAVIX) 75 MG tablet Take 1 tablet (75 mg total) by mouth daily. 90 tablet 3  . eplerenone (INSPRA) 25 MG tablet Take 25 mg by mouth every morning.     . furosemide (LASIX) 40 MG tablet Take 40mg  (1 tablet) once on one day and 20mg  (1/2 tablet) once on next day, alternating. 45 tablet 3  . insulin lispro protamine-lispro (HUMALOG 50/50 MIX) (50-50) 100 UNIT/ML SUSP injection Inject 20-30 Units into the skin 3 (three) times daily. Takes 30 units in the morning 30 at lunch and 20 at night    .  isosorbide mononitrate (IMDUR) 30 MG 24 hr tablet Take 30 mg by mouth at bedtime.    . niacin (NIASPAN) 500 MG CR tablet Take 500 mg by mouth 2 (two) times daily.     . nitroGLYCERIN (NITROSTAT) 0.4 MG SL tablet Place 1 tablet (0.4 mg total) under the tongue every 5 (five) minutes x 3 doses as needed. For chest pain. 25 tablet 3  . ranolazine (RANEXA) 1000 MG SR tablet Take 1 tablet (1,000 mg total) by mouth 2 (two) times daily. 180 tablet 3  . venlafaxine (EFFEXOR-XR) 150 MG 24 hr capsule Take 150 mg by mouth at bedtime.     Marland Kitchen zolpidem (AMBIEN CR) 12.5 MG CR tablet Take 12.5 mg by mouth at bedtime as needed. For sleep    . sacubitril-valsartan (ENTRESTO)  49-51 MG Take 1 tablet by mouth 2 (two) times daily. 180 tablet 2   No current facility-administered medications for this encounter.   BP 112/58 mmHg  Pulse 87  Wt 216 lb 4 oz (98.09 kg)  SpO2 94% General: NAD Neck: No JVD, no thyromegaly or thyroid nodule.  Lungs: Clear to auscultation bilaterally with normal respiratory effort. CV: Nondisplaced PMI.  Heart regular S1/S2, no S3/S4, no murmur.  No peripheral edema.  No carotid bruit.  Normal pedal pulses.  Abdomen: Soft, nontender, no hepatosplenomegaly, no distention.  Skin: Intact without lesions or rashes.  Neurologic: Alert and oriented x 3.  Psych: Normal affect. Extremities: No clubbing or cyanosis.   Assessment/Plan: 1. Chronic systolic CHF: NYHA class II symptoms, improved.  EF 25-30% on last echo with moderate RV systolic dysfunction. He has a Medtronic CRT-D device.  Normal filling pressures and preserved cardiac index on RHC in 11/15.  Volume looks good on exam today and Optivol confirms euvolemia. - Continue current Coreg, eplerenone, and Lasix.   - Increase Entresto to 49/51 mg bid today.  BMET today and repeat BMET in 2 wks.   2. CAD: s/p CABG and redo.  Most recently, LHC in 4/15 showed that all the SVGs were occluded but LIMA-LAD was patent.  Lateral wall ischemia on Cardiolite likely is explained by the 80% distal LM stenosis upstream from the LCx.  This was thought to be a difficult lesion for PCI and was managed medically.  No significant exertional chest pain.   - Continue ASA 81, Plavix, statin.  - He will continue Imdur and Ranexa at current doses.  3. CKD: Follow creatinine closely with BMETs as above.  4. Hyperlipidemia: Excellent lipids in 9/15.   followup in 3 months.   [name] 06/08/2014

## 2014-06-08 NOTE — Patient Instructions (Signed)
Increase Entresto to 49/51 mg Twice daily, you can take 2 tabs of the 24/26 mg tabs until you get new dose  Labs today   Labs in 2 weeks (bmet, bnp)  We will contact you in 3 months to schedule your next appointment.

## 2014-06-11 ENCOUNTER — Telehealth: Payer: Self-pay | Admitting: Cardiology

## 2014-06-11 NOTE — Telephone Encounter (Signed)
Pt is being followed at heart failure clinic. Pt only wants heart failure clinic to to follow his fluid readings.

## 2014-06-24 ENCOUNTER — Ambulatory Visit (HOSPITAL_COMMUNITY)
Admission: RE | Admit: 2014-06-24 | Discharge: 2014-06-24 | Disposition: A | Discharge status: Home / Self Care | Payer: Managed Care, Other (non HMO) | Source: Ambulatory Visit | Attending: Adult Health | Admitting: Adult Health

## 2014-06-24 DIAGNOSIS — I5022 Chronic systolic (congestive) heart failure: Secondary | ICD-10-CM | POA: Insufficient documentation

## 2014-06-24 LAB — BASIC METABOLIC PANEL
ANION GAP: 8 (ref 5–15)
BUN: 29 mg/dL — ABNORMAL HIGH (ref 6–23)
CO2: 27 mmol/L (ref 19–32)
Calcium: 8.8 mg/dL (ref 8.4–10.5)
Chloride: 103 mmol/L (ref 96–112)
Creatinine, Ser: 1.38 mg/dL — ABNORMAL HIGH (ref 0.50–1.35)
GFR calc Af Amer: 58 mL/min — ABNORMAL LOW (ref >90)
GFR, EST NON AFRICAN AMERICAN: 50 mL/min — AB (ref >90)
Glucose, Bld: 301 mg/dL — ABNORMAL HIGH (ref 70–99)
Potassium: 4 mmol/L (ref 3.5–5.1)
SODIUM: 138 mmol/L (ref 135–145)

## 2014-06-29 ENCOUNTER — Telehealth (HOSPITAL_COMMUNITY): Payer: Self-pay | Admitting: *Deleted

## 2014-06-29 ENCOUNTER — Other Ambulatory Visit (INDEPENDENT_AMBULATORY_CARE_PROVIDER_SITE_OTHER): Payer: Managed Care, Other (non HMO) | Admitting: *Deleted

## 2014-06-29 DIAGNOSIS — E785 Hyperlipidemia, unspecified: Secondary | ICD-10-CM

## 2014-06-29 LAB — HEPATIC FUNCTION PANEL
ALT: 13 U/L (ref 0–53)
AST: 13 U/L (ref 0–37)
Albumin: 4.1 g/dL (ref 3.5–5.2)
Alkaline Phosphatase: 63 U/L (ref 39–117)
Bilirubin, Direct: 0.1 mg/dL (ref 0.0–0.3)
TOTAL PROTEIN: 6.9 g/dL (ref 6.0–8.3)
Total Bilirubin: 0.5 mg/dL (ref 0.2–1.2)

## 2014-06-29 LAB — LIPID PANEL
CHOLESTEROL: 116 mg/dL (ref 0–200)
HDL: 35.5 mg/dL — ABNORMAL LOW (ref >39.00)
LDL Cholesterol: 46 mg/dL (ref 0–99)
NonHDL: 80.5
Total CHOL/HDL Ratio: 3
Triglycerides: 174 mg/dL — ABNORMAL HIGH (ref 0.0–149.0)
VLDL: 34.8 mg/dL (ref 0.0–40.0)

## 2014-06-29 NOTE — Telephone Encounter (Signed)
Pts wife called and said [employer] home delivery pharmacy wont fill pts [name] because of allergies pt request some one call  [employer] at [phone] so that the medication can be filled

## 2014-06-29 NOTE — Addendum Note (Signed)
Addended by: Eulis Foster on: 06/29/2014 07:41 AM   Modules accepted: Orders

## 2014-06-30 ENCOUNTER — Telehealth: Payer: Self-pay | Admitting: Internal Medicine

## 2014-06-30 NOTE — Telephone Encounter (Signed)
error 

## 2014-06-30 NOTE — Telephone Encounter (Signed)
[name] at [phone] they state issue has been resolved and med was shipped on 3/14

## 2014-07-01 ENCOUNTER — Telehealth (HOSPITAL_COMMUNITY): Payer: Self-pay | Admitting: Cardiology

## 2014-07-01 NOTE — Telephone Encounter (Signed)
With called to request small supply of entresto to be sent into local pharmacy until mail order arrives in 7-10 day  Advised this may cost a lot of money as this medication is still so new, will leave samples in the front office for patient to pick up

## 2014-07-07 ENCOUNTER — Encounter: Payer: Self-pay | Admitting: Cardiology

## 2014-07-08 DIAGNOSIS — I5022 Chronic systolic (congestive) heart failure: Secondary | ICD-10-CM | POA: Diagnosis not present

## 2014-07-08 DIAGNOSIS — I209 Angina pectoris, unspecified: Secondary | ICD-10-CM | POA: Diagnosis not present

## 2014-07-08 DIAGNOSIS — Z0000 Encounter for general adult medical examination without abnormal findings: Secondary | ICD-10-CM | POA: Diagnosis not present

## 2014-07-08 DIAGNOSIS — N183 Chronic kidney disease, stage 3 (moderate): Secondary | ICD-10-CM | POA: Diagnosis not present

## 2014-07-08 DIAGNOSIS — I255 Ischemic cardiomyopathy: Secondary | ICD-10-CM | POA: Diagnosis not present

## 2014-07-08 DIAGNOSIS — E1121 Type 2 diabetes mellitus with diabetic nephropathy: Secondary | ICD-10-CM | POA: Diagnosis not present

## 2014-07-08 DIAGNOSIS — F322 Major depressive disorder, single episode, severe without psychotic features: Secondary | ICD-10-CM | POA: Diagnosis not present

## 2014-07-08 DIAGNOSIS — I130 Hypertensive heart and chronic kidney disease with heart failure and stage 1 through stage 4 chronic kidney disease, or unspecified chronic kidney disease: Secondary | ICD-10-CM | POA: Diagnosis not present

## 2014-07-08 DIAGNOSIS — I25119 Atherosclerotic heart disease of native coronary artery with unspecified angina pectoris: Secondary | ICD-10-CM | POA: Diagnosis not present

## 2014-07-08 DIAGNOSIS — Z6833 Body mass index (BMI) 33.0-33.9, adult: Secondary | ICD-10-CM | POA: Diagnosis not present

## 2014-07-08 DIAGNOSIS — E780 Pure hypercholesterolemia: Secondary | ICD-10-CM | POA: Diagnosis not present

## 2014-07-08 DIAGNOSIS — E669 Obesity, unspecified: Secondary | ICD-10-CM | POA: Diagnosis not present

## 2014-07-09 ENCOUNTER — Encounter: Payer: Self-pay | Admitting: Cardiology

## 2014-07-13 ENCOUNTER — Ambulatory Visit (INDEPENDENT_AMBULATORY_CARE_PROVIDER_SITE_OTHER): Payer: Managed Care, Other (non HMO) | Admitting: *Deleted

## 2014-07-13 ENCOUNTER — Encounter: Payer: Self-pay | Admitting: Internal Medicine

## 2014-07-13 DIAGNOSIS — I255 Ischemic cardiomyopathy: Secondary | ICD-10-CM | POA: Diagnosis not present

## 2014-07-13 DIAGNOSIS — I5022 Chronic systolic (congestive) heart failure: Secondary | ICD-10-CM | POA: Diagnosis not present

## 2014-07-13 NOTE — Progress Notes (Signed)
Remote ICD transmission.   

## 2014-07-14 LAB — MDC_IDC_ENUM_SESS_TYPE_REMOTE
Brady Statistic AP VS Percent: 0.01 %
Brady Statistic AS VP Percent: 99.12 %
Brady Statistic AS VS Percent: 0.7 %
Brady Statistic RV Percent Paced: 99.3 %
HIGH POWER IMPEDANCE MEASURED VALUE: 59 Ohm
HighPow Impedance: 456 Ohm
HighPow Impedance: 46 Ohm
Lead Channel Impedance Value: 418 Ohm
Lead Channel Impedance Value: 418 Ohm
Lead Channel Impedance Value: 475 Ohm
Lead Channel Impedance Value: 570 Ohm
Lead Channel Pacing Threshold Amplitude: 0.875 V
Lead Channel Pacing Threshold Pulse Width: 0.4 ms
Lead Channel Pacing Threshold Pulse Width: 0.4 ms
Lead Channel Sensing Intrinsic Amplitude: 1.875 mV
Lead Channel Setting Pacing Amplitude: 2 V
Lead Channel Setting Pacing Amplitude: 2.25 V
Lead Channel Setting Pacing Amplitude: 2.5 V
Lead Channel Setting Pacing Pulse Width: 0.4 ms
Lead Channel Setting Pacing Pulse Width: 0.4 ms
Lead Channel Setting Sensing Sensitivity: 0.3 mV
MDC IDC MSMT BATTERY VOLTAGE: 3.08 V
MDC IDC MSMT LEADCHNL LV IMPEDANCE VALUE: 722 Ohm
MDC IDC MSMT LEADCHNL LV PACING THRESHOLD PULSEWIDTH: 0.4 ms
MDC IDC MSMT LEADCHNL RA PACING THRESHOLD AMPLITUDE: 1.375 V
MDC IDC MSMT LEADCHNL RV PACING THRESHOLD AMPLITUDE: 0.5 V
MDC IDC MSMT LEADCHNL RV SENSING INTR AMPL: 31.625 mV
MDC IDC SESS DTM: 20160328144728
MDC IDC SET ZONE DETECTION INTERVAL: 300 ms
MDC IDC SET ZONE DETECTION INTERVAL: 350 ms
MDC IDC SET ZONE DETECTION INTERVAL: 450 ms
MDC IDC STAT BRADY AP VP PERCENT: 0.17 %
MDC IDC STAT BRADY RA PERCENT PACED: 0.18 %
Zone Setting Detection Interval: 350 ms

## 2014-07-16 DIAGNOSIS — N179 Acute kidney failure, unspecified: Secondary | ICD-10-CM | POA: Diagnosis not present

## 2014-07-23 ENCOUNTER — Encounter: Payer: Self-pay | Admitting: Cardiology

## 2014-07-24 ENCOUNTER — Encounter: Payer: Self-pay | Admitting: Internal Medicine

## 2014-07-24 DIAGNOSIS — N179 Acute kidney failure, unspecified: Secondary | ICD-10-CM | POA: Diagnosis not present

## 2014-07-29 ENCOUNTER — Ambulatory Visit (INDEPENDENT_AMBULATORY_CARE_PROVIDER_SITE_OTHER): Payer: Managed Care, Other (non HMO) | Admitting: *Deleted

## 2014-07-29 DIAGNOSIS — I255 Ischemic cardiomyopathy: Secondary | ICD-10-CM

## 2014-07-29 DIAGNOSIS — I5022 Chronic systolic (congestive) heart failure: Secondary | ICD-10-CM

## 2014-07-29 NOTE — Progress Notes (Signed)
Patient presents to the office to discuss [facility] only (N/C). Verbal/visual instructions given on how to use WireX (cell adaptor). Patient voiced understanding. [place] mailed to patient. Patient understands that the cell adaptors are on back order and may not get to him for another 6-8 weeks. He is aware that his Carelink monitor is still fully functional without the cell adaptor and that we will continue to receive transmissions even if he does not have his cell adaptor by his next scheduled remote date.

## 2014-08-04 DIAGNOSIS — N179 Acute kidney failure, unspecified: Secondary | ICD-10-CM | POA: Diagnosis not present

## 2014-08-21 ENCOUNTER — Other Ambulatory Visit: Payer: Self-pay

## 2014-08-21 DIAGNOSIS — I5022 Chronic systolic (congestive) heart failure: Secondary | ICD-10-CM

## 2014-08-21 MED ORDER — CARVEDILOL 12.5 MG PO TABS: ORAL_TABLET | ORAL | Status: DC

## 2014-09-08 DIAGNOSIS — N179 Acute kidney failure, unspecified: Secondary | ICD-10-CM | POA: Diagnosis not present

## 2014-09-24 DIAGNOSIS — N179 Acute kidney failure, unspecified: Secondary | ICD-10-CM | POA: Diagnosis not present

## 2014-09-29 ENCOUNTER — Telehealth: Payer: Self-pay | Admitting: Internal Medicine

## 2014-09-29 NOTE — Telephone Encounter (Signed)
New Message   Pt wife calling to device to see if remote check needs internet connection. Please call back and discuss.

## 2014-09-29 NOTE — Telephone Encounter (Signed)
Informed pt wife that she does not need a Internet connection for home monitor to work. She verbalized understanding.

## 2014-10-01 DIAGNOSIS — N183 Chronic kidney disease, stage 3 (moderate): Secondary | ICD-10-CM | POA: Diagnosis not present

## 2014-10-06 ENCOUNTER — Other Ambulatory Visit: Payer: Self-pay | Admitting: Cardiology

## 2014-10-06 MED ORDER — ISOSORBIDE MONONITRATE ER 30 MG PO TB24: 30.0000 mg | ORAL_TABLET | Freq: Every day | ORAL | Status: DC

## 2014-10-09 DIAGNOSIS — E780 Pure hypercholesterolemia: Secondary | ICD-10-CM | POA: Diagnosis not present

## 2014-10-09 DIAGNOSIS — Z794 Long term (current) use of insulin: Secondary | ICD-10-CM | POA: Diagnosis not present

## 2014-10-09 DIAGNOSIS — Z6833 Body mass index (BMI) 33.0-33.9, adult: Secondary | ICD-10-CM | POA: Diagnosis not present

## 2014-10-09 DIAGNOSIS — I130 Hypertensive heart and chronic kidney disease with heart failure and stage 1 through stage 4 chronic kidney disease, or unspecified chronic kidney disease: Secondary | ICD-10-CM | POA: Diagnosis not present

## 2014-10-09 DIAGNOSIS — R51 Headache: Secondary | ICD-10-CM | POA: Diagnosis not present

## 2014-10-09 DIAGNOSIS — I5022 Chronic systolic (congestive) heart failure: Secondary | ICD-10-CM | POA: Diagnosis not present

## 2014-10-09 DIAGNOSIS — F322 Major depressive disorder, single episode, severe without psychotic features: Secondary | ICD-10-CM | POA: Diagnosis not present

## 2014-10-09 DIAGNOSIS — E1121 Type 2 diabetes mellitus with diabetic nephropathy: Secondary | ICD-10-CM | POA: Diagnosis not present

## 2014-10-09 DIAGNOSIS — N183 Chronic kidney disease, stage 3 (moderate): Secondary | ICD-10-CM | POA: Diagnosis not present

## 2014-10-09 DIAGNOSIS — I209 Angina pectoris, unspecified: Secondary | ICD-10-CM | POA: Diagnosis not present

## 2014-10-09 DIAGNOSIS — E669 Obesity, unspecified: Secondary | ICD-10-CM | POA: Diagnosis not present

## 2014-10-12 ENCOUNTER — Telehealth (HOSPITAL_COMMUNITY): Payer: Self-pay

## 2014-10-12 ENCOUNTER — Telehealth: Payer: Self-pay | Admitting: Cardiology

## 2014-10-12 ENCOUNTER — Other Ambulatory Visit (HOSPITAL_COMMUNITY): Payer: Self-pay

## 2014-10-12 ENCOUNTER — Encounter: Payer: Managed Care, Other (non HMO) | Admitting: *Deleted

## 2014-10-12 DIAGNOSIS — I5022 Chronic systolic (congestive) heart failure: Secondary | ICD-10-CM

## 2014-10-12 MED ORDER — SACUBITRIL-VALSARTAN 24-26 MG PO TABS: 1.0000 | ORAL_TABLET | Freq: Two times a day (BID) | ORAL | Status: DC

## 2014-10-12 MED ORDER — CARVEDILOL 6.25 MG PO TABS: 6.2500 mg | ORAL_TABLET | Freq: Two times a day (BID) | ORAL | Status: DC

## 2014-10-12 NOTE — Telephone Encounter (Signed)
Per Dr. Aundra Dubin, advised patient to reduce carvedilol to 6.25mg  twice daily and entresto to 24/46mg  tablets twice daily.  Advised to continue to hold all meds rest of today and start new doses tomorrow.  Patient will call our office Tuesday or Wednesday and let us know how BP is on new med dose regimen.  Renee Pain

## 2014-10-12 NOTE — Telephone Encounter (Signed)
Informed pt that transmission was not received. Pt agreed to appt on Wednesday 6-29 at 10:30 AM and will bring home monitor w/ him so we can show him how to use then monitor.

## 2014-10-12 NOTE — Telephone Encounter (Signed)
Follow Up   4. Are you calling to see if we received your device transmission? Yes  Comments: Pt called states they have been trying to send a transmission and with each attempt they have failed. req a call back to determine how to move forward.

## 2014-10-12 NOTE — Telephone Encounter (Signed)
Spoke with pt and reminded pt of remote transmission that is due today. Pt verbalized understanding.   

## 2014-10-12 NOTE — Telephone Encounter (Signed)
Patient's wife called c/o patient's BO 32/50 yesterday, 65/45 today. States he has felt gradually worse since increasing Entresto to 49/51 BID dosage in February, but has only recently started monitoring BP. No other s/s, has not been sick, no diarrhea or vomiting, eating and drinking the same. Cannot see where any recent med changes have been made to explain drastic reduction in BP. Instructed to hold all medications and drink extra glass of water with feet/legs elevated until I can have provider review this and give further instruction. Next apt with CHF clinic: 10/28/14 Wife aware and agreeable to plan.  Renee Pain

## 2014-10-12 NOTE — Telephone Encounter (Signed)
Decrease Coreg to 6.25 mg bid, decrease Entresto to 24/26 bid. No BP meds for the rest of today.  Keep track of BP, call us back.  Needs appt soon.

## 2014-10-14 ENCOUNTER — Ambulatory Visit (INDEPENDENT_AMBULATORY_CARE_PROVIDER_SITE_OTHER): Payer: Managed Care, Other (non HMO) | Admitting: Endocrinology

## 2014-10-14 ENCOUNTER — Ambulatory Visit (INDEPENDENT_AMBULATORY_CARE_PROVIDER_SITE_OTHER): Payer: Managed Care, Other (non HMO) | Admitting: *Deleted

## 2014-10-14 ENCOUNTER — Encounter: Payer: Self-pay | Admitting: Internal Medicine

## 2014-10-14 ENCOUNTER — Encounter: Payer: Self-pay | Admitting: Endocrinology

## 2014-10-14 ENCOUNTER — Encounter (HOSPITAL_COMMUNITY): Payer: Medicare Other

## 2014-10-14 VITALS — BP 124/68 | HR 109 | Temp 97.8°F | Resp 18 | Ht 70.0 in | Wt 210.6 lb

## 2014-10-14 DIAGNOSIS — I5022 Chronic systolic (congestive) heart failure: Secondary | ICD-10-CM

## 2014-10-14 DIAGNOSIS — E1165 Type 2 diabetes mellitus with hyperglycemia: Secondary | ICD-10-CM | POA: Diagnosis not present

## 2014-10-14 DIAGNOSIS — I255 Ischemic cardiomyopathy: Secondary | ICD-10-CM | POA: Diagnosis not present

## 2014-10-14 DIAGNOSIS — N183 Chronic kidney disease, stage 3 unspecified: Secondary | ICD-10-CM

## 2014-10-14 DIAGNOSIS — IMO0002 Reserved for concepts with insufficient information to code with codable children: Secondary | ICD-10-CM

## 2014-10-14 LAB — CUP PACEART INCLINIC DEVICE CHECK
Battery Voltage: 3.05 V
Brady Statistic AP VP Percent: 0.3 %
Brady Statistic AS VP Percent: 98.96 %
Brady Statistic AS VS Percent: 0.74 %
Brady Statistic RA Percent Paced: 0.3 %
Brady Statistic RV Percent Paced: 99.25 %
Date Time Interrogation Session: 20160629111024
HIGH POWER IMPEDANCE MEASURED VALUE: 513 Ohm
HIGH POWER IMPEDANCE MEASURED VALUE: 58 Ohm
HighPow Impedance: 209 Ohm
HighPow Impedance: 46 Ohm
Lead Channel Impedance Value: 418 Ohm
Lead Channel Impedance Value: 418 Ohm
Lead Channel Impedance Value: 418 Ohm
Lead Channel Impedance Value: 627 Ohm
Lead Channel Pacing Threshold Amplitude: 0.5 V
Lead Channel Pacing Threshold Amplitude: 1 V
Lead Channel Pacing Threshold Pulse Width: 0.4 ms
Lead Channel Pacing Threshold Pulse Width: 0.4 ms
Lead Channel Sensing Intrinsic Amplitude: 1.75 mV
Lead Channel Sensing Intrinsic Amplitude: 2 mV
Lead Channel Setting Pacing Amplitude: 2 V
Lead Channel Setting Pacing Amplitude: 2.5 V
Lead Channel Setting Pacing Amplitude: 2.5 V
Lead Channel Setting Pacing Pulse Width: 0.4 ms
Lead Channel Setting Pacing Pulse Width: 0.4 ms
Lead Channel Setting Sensing Sensitivity: 0.3 mV
MDC IDC MSMT LEADCHNL LV IMPEDANCE VALUE: 722 Ohm
MDC IDC MSMT LEADCHNL LV PACING THRESHOLD PULSEWIDTH: 0.4 ms
MDC IDC MSMT LEADCHNL RA PACING THRESHOLD AMPLITUDE: 1.625 V
MDC IDC MSMT LEADCHNL RV SENSING INTR AMPL: 31.5 mV
MDC IDC MSMT LEADCHNL RV SENSING INTR AMPL: 31.625 mV
MDC IDC SET ZONE DETECTION INTERVAL: 350 ms
MDC IDC SET ZONE DETECTION INTERVAL: 350 ms
MDC IDC SET ZONE DETECTION INTERVAL: 450 ms
MDC IDC STAT BRADY AP VS PERCENT: 0.01 %
Zone Setting Detection Interval: 300 ms

## 2014-10-14 NOTE — Patient Instructions (Signed)
Check blood sugars on waking up .Marland Kitchen4-5  .Marland Kitchen times a week Also check blood sugars about 2 hours after a meal and do this after different meals by rotation  Recommended blood sugar levels on waking up is 90-130 and about 2 hours after meal is 140-180 Please bring blood sugar monitor to each visit.  Relion regular insulin 10 units before meals  Humlaog Mix 25 in am  Protein in ams

## 2014-10-14 NOTE — Progress Notes (Signed)
Patient ID: [name], male   DOB: [birth year], 74 y.o.   MRN: [id number]           Reason for Appointment: Consultation for Type 2 Diabetes  Referring physician: Brigitte Pulse  History of Present Illness:          Date of diagnosis of type 2 diabetes mellitus: 1997       Background history:  He was initially treated with oral agents and also at some point was given Victoza. Most of his diabetes treatment has been with insulin which he has taken since at least 2005 Although he had improved control with Victoza he stopped this as he started having some low sugars Although he was tried on metformin in 2012 this had been subsequently stopped because of renal dysfunction  Recent history:  He was initially treated with 70/30 insulin but subsequently was given Apidra and Lantus He was taking about 90 units of Lantus daily in split doses and 35-40 units Apidra previously with each meal He thinks that about a year ago one of his physicians change his insulin to Humalog 50/50 This is partly because of the cost of insulin He appears to be taking very small doses of insulin now compared to his previous regimen He did not bring his monitor for download and difficult to know what his blood sugar patterns are His lab glucose after breakfast in the physician's office was 347 but he thinks his blood sugars are only rarely over 200  Current management of his insulin: He takes about the same amount for breakfast and supper, usually 20 units He takes between 15 and 25 units around lunchtime based on whether he is eating a meal or not Diet: He is having variable intake and usually has unbalanced high carbohydrate breakfast, sometimes will have fast food for lunch and mixed meal the evening. May have snacks later at night also.  He usually drinks low sugar Gatorade or water when he is thirsty His A1c has been over 10% since 06/2014, previously in 2015 was between 8.2 and 8.8  INSULIN regimen is described as:   Humalog 50/50,  before meals: 20 at breakfast, 15/25 lunch, 20 ac supper, generally taking before eating      Current blood sugar patterns and problems identified:   as above     Compliance with the medical regimen:  fair  Hypoglycemia:   none, he thinks his lowest blood sugar has been about 84  Glucose monitoring:  done 3 times a day         Glucometer:  Bayer     Blood Glucose readings by recall   PREMEAL Breakfast Lunch Dinner Bedtime  Overall   Glucose range: 140-200  170-180  180   Median:        Self-care: The diet that the patient has been following is: None .     Meal times: Breakfast:10 am Lunch:  not consistent  Dinner: 7 p.m.  Typical meal intake: Breakfast is cereal, occasionally having fish sandwich from [employer]               Dietician visit, most recent: 2013                Exercise:Gardening    Weight  history: the last few months he has lost about 35 pounds  Wt Readings from Last 3 Encounters:  10/14/14 210 lb 9.6 oz (95.528 kg)  06/08/14 216 lb 4 oz (98.09 kg)  03/04/14 211 lb (95.709 kg)  Glycemic control:   Lab Results  Component Value Date   HGBA1C 8.9* 01/05/2012   HGBA1C 7.9* 08/25/2011   HGBA1C 8.9* 03/02/2011   Lab Results  Component Value Date   LDLCALC 46 06/29/2014   CREATININE 1.38* 06/24/2014    Last Creatinine 2.9, Last microalbumin/creatinine ratio 112 in 3/16     Medication List       This list is accurate as of: 10/14/14  1:04 PM.  Always use your most recent med list.               allopurinol 100 MG tablet  Commonly known as:  ZYLOPRIM  Take 100 mg by mouth 2 (two) times daily.     aspirin EC 81 MG tablet  Take 81 mg by mouth daily.     atorvastatin 40 MG tablet  Commonly known as:  LIPITOR  Take 40 mg by mouth daily at 6 PM.     benazepril 10 MG tablet  Commonly known as:  LOTENSIN     carvedilol 6.25 MG tablet  Commonly known as:  COREG  Take 1 tablet (6.25 mg total) by mouth 2 (two) times daily with a  meal.     clopidogrel 75 MG tablet  Commonly known as:  PLAVIX  Take 1 tablet (75 mg total) by mouth daily.     eplerenone 25 MG tablet  Commonly known as:  INSPRA  Take 25 mg by mouth every morning.     furosemide 40 MG tablet  Commonly known as:  LASIX  Take 40mg  (1 tablet) once on one day and 20mg  (1/2 tablet) once on next day, alternating.     insulin lispro protamine-lispro (50-50) 100 UNIT/ML Susp injection  Commonly known as:  HUMALOG 50/50 MIX  Inject 20-30 Units into the skin 3 (three) times daily. Takes 30 units in the morning 30 at lunch and 20 at night     isosorbide mononitrate 30 MG 24 hr tablet  Commonly known as:  IMDUR  Take 1 tablet (30 mg total) by mouth at bedtime.     niacin 500 MG CR tablet  Commonly known as:  NIASPAN     nitroGLYCERIN 0.4 MG SL tablet  Commonly known as:  NITROSTAT  Place 1 tablet (0.4 mg total) under the tongue every 5 (five) minutes x 3 doses as needed. For chest pain.     ONE TOUCH ULTRA TEST test strip  Generic drug:  glucose blood     ONETOUCH DELICA LANCETS 92E Misc     ranolazine 1000 MG SR tablet  Commonly known as:  RANEXA  Take 1 tablet (1,000 mg total) by mouth 2 (two) times daily.     sacubitril-valsartan 24-26 MG  Commonly known as:  ENTRESTO  Take 1 tablet by mouth 2 (two) times daily.     venlafaxine XR 150 MG 24 hr capsule  Commonly known as:  EFFEXOR-XR  Take 150 mg by mouth at bedtime.     zolpidem 12.5 MG CR tablet  Commonly known as:  AMBIEN CR  Take 12.5 mg by mouth at bedtime as needed. For sleep        Allergies:  Allergies  Allergen Reactions  . Codeine Nausea And Vomiting  . Latex Rash    Specifies adhesive     Past Medical History  Diagnosis Date  . Ischemic cardiomyopathy     s/p ICD Implantation by Dr Leonia Reeves  . Chronic systolic dysfunction of left ventricle     EF  30%  . Hyperlipemia   . DJD (degenerative joint disease)   . Gout   . HTN (hypertension) 02/14/2011  . Depression     . Angina   . Myocardial infarction 1997  . Cardiac defibrillator in situ   . DM (diabetes mellitus) 02/14/2011  . CHF (congestive heart failure)     EF 30% by cath 05/2011  . ICD (implantable cardiac defibrillator) in place   . GERD (gastroesophageal reflux disease)   . Heart attack   . Complication of anesthesia     ONCE WITH BACK SURGERY DIFFICULT TO WAKE UP  . CAD (coronary artery disease)     s/p CABG 1997, PCI (BMS) of SVG to RCA 3/09, redo CABG 02/2011 with SVG to PDA, SVG to Lcx, s/p cath 2.2013 with ocluded SVT to left circ and patent SVG to RCA.  Cath 07/2013 Recent cath showed ischemic cardiomyopathy with LVEF less than 20%, with progressive LV dysfunction related to bypass graft failure, occlusion of the saphenous vein grafts placed in 2012 to the right coronary and to the circum    Past Surgical History  Procedure Laterality Date  . Cardiac defibrillator placement  08/2004    initial placement, upgraded to [place] ICD by Dr Lovena Le 08/24/11 (MDT)  . Knee arthrotomy  ~ 1978    RIGHT KNEE CARTILAGE REMOVED  . Coronary angioplasty with stent placement  06/2007    BMS to SVG to RCA  . Coronary angioplasty  02/2011  . Cataract extraction w/ intraocular lens  implant, bilateral  2012  . Cardiac defibrillator placement  2009  . Lumbar disc surgery  2003  . Back surgery  2003  . Coronary artery bypass graft  01/1996    CABG x 5 LIMA to LAD SVG to diag1,2,svg to om,svg to RCA  . Coronary artery bypass graft  03/06/2011    CABG X2; Procedure: REDO CORONARY ARTERY BYPASS GRAFTING (CABG);  Surgeon: Grace Isaac, MD;  Location: [place];  Service: Open Heart Surgery;  Laterality: N/A;  times two grafts using right saphenous vein harvested endoscopically.  . Cholecystectomy  01/05/2012    Procedure: LAPAROSCOPIC CHOLECYSTECTOMY WITH INTRAOPERATIVE CHOLANGIOGRAM;  Surgeon: Joyice Faster. Cornett, MD;  Location: [place];  Service: General;  Laterality: N/A;  laparoscopic cholecysectoym with  intraoperative cholangiogram  . Colonoscopy with propofol N/A 11/18/2013    Procedure: COLONOSCOPY WITH PROPOFOL;  Surgeon: Garlan Fair, MD;  Location: [facility];  Service: Endoscopy;  Laterality: N/A;  . Left heart catheterization with coronary angiogram N/A 06/06/2011    Procedure: LEFT HEART CATHETERIZATION WITH CORONARY ANGIOGRAM;  Surgeon: Sueanne Margarita, MD;  Location: [place] CATH LAB;  Service: Cardiovascular;  Laterality: N/A;  . Venogram N/A 06/08/2011    Procedure: VENOGRAM;  Surgeon: Thompson Grayer, MD;  Location: [facility] CATH LAB;  Service: Cardiovascular;  Laterality: N/A;  . Bi-ventricular implantable cardioverter defibrillator upgrade N/A 08/24/2011    Procedure: BI-VENTRICULAR IMPLANTABLE CARDIOVERTER DEFIBRILLATOR UPGRADE;  Surgeon: Evans Lance, MD;  Location: [facility] CATH LAB;  Service: Cardiovascular;  Laterality: N/A;  . Left heart catheterization with coronary/graft angiogram N/A 08/08/2013    Procedure: LEFT HEART CATHETERIZATION WITH Beatrix Fetters;  Surgeon: Sinclair Grooms, MD;  Location: [facility] CATH LAB;  Service: Cardiovascular;  Laterality: N/A;  . Right heart catheterization N/A 02/17/2014    Procedure: RIGHT HEART CATH;  Surgeon: Larey Dresser, MD;  Location: [facility] CATH LAB;  Service: Cardiovascular;  Laterality: N/A;    Family History  Problem Relation Age  of Onset  . Coronary artery disease    . Heart failure Mother   . Heart failure Father   . Heart attack Mother   . Heart attack Father     Social History:  reports that he quit smoking about 45 years ago. His smoking use included Cigarettes. He has a 7.5 pack-year smoking history. He has never used smokeless tobacco. He reports that he drinks about 0.6 oz of alcohol per week. He reports that he does not use illicit drugs.    Review of Systems    Lipid history:     Lab Results  Component Value Date   CHOL 116 06/29/2014   HDL 35.50* 06/29/2014   LDLCALC 46 06/29/2014   TRIG 174.0* 06/29/2014   CHOLHDL 3  06/29/2014           Constitutional: , no complaints of unusual fatigue   Eyes: no history of blurred vision.  Most recent eye exam was 6/15  ENT: no nasal congestion, difficulty swallowing  Cardiovascular: no chest pain or tightness on exertion.  No leg swelling.  Hypertension: Recently his benazepril was stopped because of increased creatinine of 2.9, previously 1.76 in March  Respiratory: no cough/shortness of breath.  Previously has had history of CHF  Gastrointestinal: no constipation, diarrhea, nausea or abdominal pain  Musculoskeletal: no muscle/joint aches.  Tends to have periodic low back pain, less recently   Urological:   No frequency of urination , has nocturia 1 or 2 times  Skin: no rash or infections  Neurological: no headaches.  Has no numbness, burning, pains; will have some tingling in feet , not on any medications for this   Psychiatric:  on treatment for depression, also has had difficulty with sleep and sometimes has daytime somnolence  Endocrine: No unusual fatigue, cold intolerance or history of thyroid disease     Physical Examination:  BP 124/68 mmHg  Pulse 109  Temp(Src) 97.8 F (36.6 C)  Resp 18  Ht 5\' 10"  (1.778 m)  Wt 210 lb 9.6 oz (95.528 kg)  BMI 30.22 kg/m2  SpO2 94%  GENERAL:         Patient has generalized obesity.   HEENT:         Eye exam shows normal external appearance. Fundus exam shows no retinopathy. Oral exam shows normal mucosa .  NECK:   There is no lymphadenopathy Thyroid is not enlarged and no nodules felt.  Carotids are normal to palpation and no bruit heard LUNGS:         Chest is symmetrical. Lungs are clear to auscultation.Marland Kitchen   HEART:         Heart sounds:  S1 and S2 are normal. No murmurs or clicks heard., no S3 or S4.   ABDOMEN:   There is no distention present. Liver and spleen are not palpable. No other mass or tenderness present.   NEUROLOGICAL:    Diabetic foot exam shows normal monofilament sensation in the  toes and plantar surfaces, no skin lesions or ulcers on the feet and normal pedal pulses         MUSCULOSKELETAL:  There is no swelling or deformity of the peripheral joints. Spine is normal to inspection.   EXTREMITIES:     There is no edema. No skin lesions present.Marland Kitchen SKIN:       No rash or lesions of concern.        ASSESSMENT:  Diabetes type 2, uncontrolled     He has had long-standing  diabetes, requiring insulin  He has been historically somewhat insulin resistant and has had difficulty getting consistent control Previously was taking nearly 200 units of insulin a day and currently is only taking about 60-70 units a day Difficult to assess his control as he did not bring his monitor and not clear if his recall for his blood sugars are accurate Also discussed that he is not taking a true basal bolus insulin regimen which would twice a day ideal since he has variable meal intake especially at lunch and also variable activity level With 50/50 insulin he is likely to be getting enough insulin to cover both his basal and mealtime requirements  However he is very concerned about the cost of insulin and discussed possibility of taking generic regular and NPH insulin with a syringe instead of the pen which he prefers  Complications: none evident, has had increased urine microalbumin recently, has symptoms of neuropathy but no objective findings.  Multiple other medical problems including renal dysfunction, depression, coronary artery disease, history of CHF, dyslipidemia currently followed by PCP  PLAN:   As in patient instructions he will start monitoring his blood sugars after meals more often to help adjust his mealtime coverage  Ideally he should be back on basal bolus insulin or a trial of U-500 insulin but he wanted to use up his current premixed insulin as it is expensive  For now he will add Regular Insulin OTC 10 units before breakfast and supper and may increase this to 15 units at  supper if blood sugars are higher after evening meal  He will also increase his morning insulin by at least 5 units to cover his daytime hyperglycemia  He needs to add protein to breakfast consistently and given him a list of items to add to his breakfast and avoid cereal  Probably needs to have snacks regularly if he is planning to be more active especially if he does not eat lunch  Will review his blood sugars on the next visit and adjust insulin accordingly  Also recommend consultation with diabetes educator and dietitian  Patient Instructions  Check blood sugars on waking up .Marland Kitchen4-5  .Marland Kitchen times a week Also check blood sugars about 2 hours after a meal and do this after different meals by rotation  Recommended blood sugar levels on waking up is 90-130 and about 2 hours after meal is 140-180 Please bring blood sugar monitor to each visit.  Relion regular insulin 10 units before meals  Humlaog Mix 25 in am  Protein in ams    [facility] 10/14/2014, 1:04 PM   Note: This office note was prepared with Dragon voice recognition system technology. Any transcriptional errors that result from this process are unintentional.

## 2014-10-14 NOTE — Progress Notes (Signed)
CRT-D device check in office. Thresholds and sensing consistent with previous device measurements. Lead impedance trends stable over time. No mode switch episodes recorded. 3 NSVT, 1 new since last Carelink---longest 9 beats. Patient bi-ventricularly pacing 99% of the time. Device programmed with appropriate safety margins. Heart failure diagnostics reviewed and trends are stable for patient. Audible alert demonstrated for patient, pt knows to call clinic if heard. No changes made this session. Remaining power 3.05V. [place]. Instructions given to set up & send manual transmission. ROV w/ JA 01/11/15.

## 2014-10-22 DIAGNOSIS — I480 Paroxysmal atrial fibrillation: Secondary | ICD-10-CM

## 2014-10-22 DIAGNOSIS — N179 Acute kidney failure, unspecified: Secondary | ICD-10-CM | POA: Diagnosis not present

## 2014-10-22 HISTORY — DX: Paroxysmal atrial fibrillation: I48.0

## 2014-10-23 ENCOUNTER — Other Ambulatory Visit: Payer: Self-pay

## 2014-10-23 MED ORDER — RANOLAZINE ER 1000 MG PO TB12: 1000.0000 mg | ORAL_TABLET | Freq: Two times a day (BID) | ORAL | Status: DC

## 2014-10-28 ENCOUNTER — Ambulatory Visit (HOSPITAL_COMMUNITY)
Admission: RE | Admit: 2014-10-28 | Discharge: 2014-10-28 | Disposition: A | Discharge status: Home / Self Care | Payer: Managed Care, Other (non HMO) | Source: Ambulatory Visit | Attending: Cardiology | Admitting: Cardiology

## 2014-10-28 VITALS — BP 90/50 | HR 81 | Wt 217.5 lb

## 2014-10-28 DIAGNOSIS — I2510 Atherosclerotic heart disease of native coronary artery without angina pectoris: Secondary | ICD-10-CM

## 2014-10-28 DIAGNOSIS — I5022 Chronic systolic (congestive) heart failure: Secondary | ICD-10-CM | POA: Insufficient documentation

## 2014-10-28 DIAGNOSIS — Z79899 Other long term (current) drug therapy: Secondary | ICD-10-CM | POA: Insufficient documentation

## 2014-10-28 DIAGNOSIS — E1122 Type 2 diabetes mellitus with diabetic chronic kidney disease: Secondary | ICD-10-CM | POA: Insufficient documentation

## 2014-10-28 DIAGNOSIS — Z87891 Personal history of nicotine dependence: Secondary | ICD-10-CM | POA: Insufficient documentation

## 2014-10-28 DIAGNOSIS — Z8249 Family history of ischemic heart disease and other diseases of the circulatory system: Secondary | ICD-10-CM | POA: Diagnosis not present

## 2014-10-28 DIAGNOSIS — Z951 Presence of aortocoronary bypass graft: Secondary | ICD-10-CM | POA: Diagnosis not present

## 2014-10-28 DIAGNOSIS — E785 Hyperlipidemia, unspecified: Secondary | ICD-10-CM | POA: Insufficient documentation

## 2014-10-28 DIAGNOSIS — Z7902 Long term (current) use of antithrombotics/antiplatelets: Secondary | ICD-10-CM | POA: Diagnosis not present

## 2014-10-28 DIAGNOSIS — Z95810 Presence of automatic (implantable) cardiac defibrillator: Secondary | ICD-10-CM | POA: Diagnosis not present

## 2014-10-28 DIAGNOSIS — N189 Chronic kidney disease, unspecified: Secondary | ICD-10-CM | POA: Diagnosis not present

## 2014-10-28 DIAGNOSIS — I255 Ischemic cardiomyopathy: Secondary | ICD-10-CM | POA: Insufficient documentation

## 2014-10-28 DIAGNOSIS — Z7982 Long term (current) use of aspirin: Secondary | ICD-10-CM | POA: Insufficient documentation

## 2014-10-28 DIAGNOSIS — K219 Gastro-esophageal reflux disease without esophagitis: Secondary | ICD-10-CM | POA: Insufficient documentation

## 2014-10-28 LAB — BASIC METABOLIC PANEL
ANION GAP: 9 (ref 5–15)
BUN: 23 mg/dL — ABNORMAL HIGH (ref 6–20)
CHLORIDE: 105 mmol/L (ref 101–111)
CO2: 23 mmol/L (ref 22–32)
Calcium: 8.9 mg/dL (ref 8.9–10.3)
Creatinine, Ser: 1.39 mg/dL — ABNORMAL HIGH (ref 0.61–1.24)
GFR calc non Af Amer: 49 mL/min — ABNORMAL LOW (ref >60)
GFR, EST AFRICAN AMERICAN: 57 mL/min — AB (ref >60)
GLUCOSE: 292 mg/dL — AB (ref 65–99)
Potassium: 4 mmol/L (ref 3.5–5.1)
Sodium: 137 mmol/L (ref 135–145)

## 2014-10-28 LAB — BRAIN NATRIURETIC PEPTIDE: B NATRIURETIC PEPTIDE 5: 211.3 pg/mL — AB (ref 0.0–100.0)

## 2014-10-28 NOTE — Patient Instructions (Signed)
Increase Furosemide (Lasix) to 40 mg Twice daily for 3 DAYS ONLY,  THEN decrease back to 20 mg (1/2 tab) in AM and 40 mg (1 tab) in PM  Labs today  Your physician recommends that you schedule a follow-up appointment in: 6 weeks

## 2014-10-28 NOTE — Progress Notes (Signed)
Patient ID: [name], male   DOB: Oct 06, 1940, 74 y.o.   MRN: [id number] PCP: Dr. Lynnda Child Primary Cardiologist: Dr. Radford Pax  74 yo with history of CAD s/p CABG and redo CABG as well as ischemic cardiomyopathy with CRT-D device presents for CHF clinic evaluation.  He had a Cardiolite in the 4/15 showing lateral wall ischemia.  LHC at that time showed all his vein grafts from both CABG surgeries occluded.  The LIMA-LAD was patent and there was a 90% distal LM stenosis.  The lateral ischemia likely corresponded to LCx territory downstream from the LM stenosis.  PCI of the distal LM was thought to be high risk and characteristics of the lesion were not favorable for PCI.  Last echo showed EF 25-30% with moderate RV systolic dysfunction.  He had RHC in 11/15 with normal filling pressures and relatively preserved cardiac index (2.55).    After last appointment, [name] apparently was taking both Entresto and benazepril.  He felt lightheaded and fatigued.  This was caught by his PCP and he is now on [place] only.  Lightheadedness has resolved. BP 90/58 today, but he checks BP regularly at home and SBP is almost always > 100 now. No dyspnea walking on flat ground.  No chest pain.  Some dyspnea walking up a hill.  No orthopnea/PND.  He has been out of Lasix for about 3 days now and feels like fluid is building up. Weight is up 1 lb.    Optivol was checked today.  Fluid index < threshold but trending up with impedance trending down.   Labs (9/15): LDL particle number 816, LDL 57, LFTs normal Labs (10/15): K 4.4, creatinine 1.4 Labs (11/15): K 4.3, creatinine 1.36 Labs (12/15): K 4.8, creatinine 1.34 Labs (3/16): K 4, creatinine 1.38, LDL 46, HDl 35  PMH: 1. Gout 2. Hyperlipidemia 3. CAD: CABG 1997 and redo 11/12.  LHC (4/15) with totally occluded LAD, totally occluded RCA, 80% distal LM, 50% mLCx, 2 SVG-RCA grafts totally occluded, 2 SVG-OM grafts totally occluded, patent LIMA-LAD.  Cardiolite prior to  4/15 cath showed lateral wall ischemia (LCx territory).  PCI to distal LM would be a high risk procedure.  4. Ischemic Cardiomyopathy: Medtronic CRT-D device.  Echo (6/15) with EF 25-30%, moderate LV dilation, inferior and inferolateral akinesis, moderately decreased RV systolic function, mild Brandon.  RHC (11/15) with mean RA 5, PA 23/6, mean PCWP 9, CI 2.55.  5. H/o cholecystectomy 6. OA 7. Depression 8. Type II diabetes 9. GERD 10. CKD  SH: Married, prior smoker (many years ago), lives in [place].    FH: CAD  ROS: All systems reviewed and negative except as per HPI.   Current Outpatient Prescriptions  Medication Sig Dispense Refill  . allopurinol (ZYLOPRIM) 100 MG tablet Take 100 mg by mouth 2 (two) times daily.     Marland Kitchen aspirin EC 81 MG tablet Take 81 mg by mouth daily.    Marland Kitchen atorvastatin (LIPITOR) 40 MG tablet Take 40 mg by mouth daily at 6 PM.     . carvedilol (COREG) 6.25 MG tablet Take 1 tablet (6.25 mg total) by mouth 2 (two) times daily with a meal. 6.25 tablet 60  . clopidogrel (PLAVIX) 75 MG tablet Take 1 tablet (75 mg total) by mouth daily. 90 tablet 3  . eplerenone (INSPRA) 25 MG tablet Take 25 mg by mouth every morning.     . furosemide (LASIX) 40 MG tablet Take 1/2 tab in AM and 1 tab in  PM    . insulin lispro protamine-lispro (HUMALOG 50/50 MIX) (50-50) 100 UNIT/ML SUSP injection Inject 20-30 Units into the skin 3 (three) times daily. Takes 30 units in the morning 30 at lunch and 20 at night    . isosorbide mononitrate (IMDUR) 30 MG 24 hr tablet Take 1 tablet (30 mg total) by mouth at bedtime. 90 tablet 1  . nitroGLYCERIN (NITROSTAT) 0.4 MG SL tablet Place 1 tablet (0.4 mg total) under the tongue every 5 (five) minutes x 3 doses as needed. For chest pain. 25 tablet 3  . omeprazole (PRILOSEC) 20 MG capsule Take 20 mg by mouth as needed.    . ONE TOUCH ULTRA TEST test strip     . ONETOUCH DELICA LANCETS 56Y MISC     . ranolazine (RANEXA) 1000 MG SR tablet Take 1 tablet (1,000  mg total) by mouth 2 (two) times daily. 60 tablet 0  . sacubitril-valsartan (ENTRESTO) 24-26 MG Take 1 tablet by mouth 2 (two) times daily. 60 tablet 6  . venlafaxine (EFFEXOR-XR) 150 MG 24 hr capsule Take 150 mg by mouth at bedtime.     Marland Kitchen zolpidem (AMBIEN CR) 12.5 MG CR tablet Take 12.5 mg by mouth at bedtime as needed. For sleep     No current facility-administered medications for this encounter.   BP 90/50 mmHg  Pulse 81  Wt 217 lb 8 oz (98.657 kg)  SpO2 96% General: NAD Neck: JVP 8 cm, no thyromegaly or thyroid nodule.  Lungs: Clear to auscultation bilaterally with normal respiratory effort. CV: Nondisplaced PMI.  Heart regular S1/S2, no S3/S4, no murmur.  1+ ankle edema.  No carotid bruit.  Normal pedal pulses.  Abdomen: Soft, nontender, no hepatosplenomegaly, no distention.  Skin: Intact without lesions or rashes.  Neurologic: Alert and oriented x 3.  Psych: Normal affect. Extremities: No clubbing or cyanosis.   Assessment/Plan: 1. Chronic systolic CHF: NYHA class II symptoms, stable.  EF 25-30% on last echo with moderate RV systolic dysfunction. He has a Medtronic CRT-D device.  Normal filling pressures and preserved cardiac index on RHC in 11/15.  He has missed Lasix for several days and has some volume overload on exam.  Optivol suggests development of volume overload as well. - Continue current Coreg, eplerenone, Entresto.  He is no longer taking benazepril.  - I will refill Lasix.  He needs to take 40 mg bid x 3 days then back to 20 qam/40 qpm.  BMET/BNP today.   2. CAD: s/p CABG and redo.  Most recently, LHC in 4/15 showed that all the SVGs were occluded but LIMA-LAD was patent.  Lateral wall ischemia on Cardiolite likely is explained by the 80% distal LM stenosis upstream from the LCx.  This was thought to be a difficult lesion for PCI and was managed medically.  No significant exertional chest pain.   - Continue ASA 81, Plavix, statin.  - He will continue Imdur and Ranexa at  current doses.  3. CKD: Follow creatinine closely with BMET as above.  4. Hyperlipidemia: Excellent lipids in 9/15.   followup in 6 wks.   [name] 10/28/2014

## 2014-10-30 DIAGNOSIS — N179 Acute kidney failure, unspecified: Secondary | ICD-10-CM | POA: Diagnosis not present

## 2014-11-11 DIAGNOSIS — N179 Acute kidney failure, unspecified: Secondary | ICD-10-CM | POA: Diagnosis not present

## 2014-11-11 DIAGNOSIS — R51 Headache: Secondary | ICD-10-CM | POA: Diagnosis not present

## 2014-11-11 DIAGNOSIS — I130 Hypertensive heart and chronic kidney disease with heart failure and stage 1 through stage 4 chronic kidney disease, or unspecified chronic kidney disease: Secondary | ICD-10-CM | POA: Diagnosis not present

## 2014-11-18 ENCOUNTER — Ambulatory Visit: Payer: Medicare Other | Admitting: Endocrinology

## 2014-11-24 ENCOUNTER — Encounter: Payer: Self-pay | Admitting: Internal Medicine

## 2014-11-25 ENCOUNTER — Other Ambulatory Visit: Payer: Self-pay | Admitting: *Deleted

## 2014-11-25 ENCOUNTER — Telehealth (HOSPITAL_COMMUNITY): Payer: Self-pay | Admitting: Vascular Surgery

## 2014-11-25 NOTE — Telephone Encounter (Signed)
Pt called she wants to speak to [facility] or someone that can help with the medication Renexa 1000mg  .. She wants to know if there is a cheaper version of this medication.. Please advise

## 2014-11-26 ENCOUNTER — Other Ambulatory Visit: Payer: Self-pay | Admitting: Internal Medicine

## 2014-11-26 MED ORDER — NITROGLYCERIN 0.4 MG SL SUBL: 0.4000 mg | SUBLINGUAL_TABLET | SUBLINGUAL | Status: DC | PRN

## 2014-11-26 MED ORDER — RANOLAZINE ER 1000 MG PO TB12: 1000.0000 mg | ORAL_TABLET | Freq: Two times a day (BID) | ORAL | Status: DC

## 2014-11-27 NOTE — Telephone Encounter (Signed)
Spoke with pharmacist Eudelia Bunch. Renexa is in a drug class of its own, depending on why patient is using the med would determine if he can change to something cheaper  Pt reprts he uses the meds for chest pains/discomforts  Will review with Doroteo Bradford, PharmD  Reports we can go up on imdur to see if that helps Unable to start calcium channel blocker given his systolic heart failure

## 2014-12-03 NOTE — Telephone Encounter (Signed)
Pt wife called and said that she will continue with the Renexa. They have an appt with DM next week and follow up with him.

## 2014-12-09 ENCOUNTER — Encounter (HOSPITAL_COMMUNITY): Payer: Self-pay

## 2014-12-09 ENCOUNTER — Ambulatory Visit (HOSPITAL_COMMUNITY)
Admission: RE | Admit: 2014-12-09 | Discharge: 2014-12-09 | Disposition: A | Discharge status: Home / Self Care | Payer: Managed Care, Other (non HMO) | Source: Ambulatory Visit | Attending: Cardiology | Admitting: Cardiology

## 2014-12-09 VITALS — BP 110/60 | HR 78 | Wt 215.2 lb

## 2014-12-09 DIAGNOSIS — Z951 Presence of aortocoronary bypass graft: Secondary | ICD-10-CM | POA: Diagnosis not present

## 2014-12-09 DIAGNOSIS — I255 Ischemic cardiomyopathy: Secondary | ICD-10-CM

## 2014-12-09 DIAGNOSIS — E785 Hyperlipidemia, unspecified: Secondary | ICD-10-CM | POA: Insufficient documentation

## 2014-12-09 DIAGNOSIS — I5022 Chronic systolic (congestive) heart failure: Secondary | ICD-10-CM | POA: Insufficient documentation

## 2014-12-09 DIAGNOSIS — N189 Chronic kidney disease, unspecified: Secondary | ICD-10-CM | POA: Diagnosis not present

## 2014-12-09 DIAGNOSIS — Z87891 Personal history of nicotine dependence: Secondary | ICD-10-CM | POA: Diagnosis not present

## 2014-12-09 DIAGNOSIS — I2510 Atherosclerotic heart disease of native coronary artery without angina pectoris: Secondary | ICD-10-CM | POA: Diagnosis not present

## 2014-12-09 MED ORDER — CARVEDILOL 6.25 MG PO TABS: 9.3750 mg | ORAL_TABLET | Freq: Two times a day (BID) | ORAL | Status: DC

## 2014-12-09 MED ORDER — ISOSORBIDE MONONITRATE ER 60 MG PO TB24: 90.0000 mg | ORAL_TABLET | Freq: Every day | ORAL | Status: DC

## 2014-12-09 NOTE — Progress Notes (Signed)
Patient ID: [name], male   DOB: 08/20/1940, 74 y.o.   MRN: [id number] PCP: Dr. Lynnda Child Primary Cardiologist: Dr. Radford Pax  74 yo with history of CAD s/p CABG and redo CABG as well as ischemic cardiomyopathy with CRT-D device presents for CHF clinic evaluation.  He had a Cardiolite in the 4/15 showing lateral wall ischemia.  LHC at that time showed all his vein grafts from both CABG surgeries occluded.  The LIMA-LAD was patent and there was a 90% distal LM stenosis.  The lateral ischemia likely corresponded to LCx territory downstream from the LM stenosis.  PCI of the distal LM was thought to be high risk and characteristics of the lesion were not favorable for PCI.  Last echo showed EF 25-30% with moderate RV systolic dysfunction.  He had RHC in 11/15 with normal filling pressures and relatively preserved cardiac index (2.55).    BP stable, no lightheadedness.  No dyspnea walking on flat ground.  No chest pain.  Some dyspnea walking up a hill.  No orthopnea/PND.  Weight is down 2 lbs.  No chest pain.  He is on Ranexa but price is going up considerably and he thinks that he will need an alternative.    Optivol was checked today.  Fluid index < threshold with impedance rising.   Labs (9/15): LDL particle number 816, LDL 57, LFTs normal Labs (10/15): K 4.4, creatinine 1.4 Labs (11/15): K 4.3, creatinine 1.36 Labs (12/15): K 4.8, creatinine 1.34 Labs (3/16): K 4, creatinine 1.38, LDL 46, HDl 35 Labs (7/16): K 4, creatinine 1.39, BNP 211  ECG: a-paced, v-paced   PMH: 1. Gout 2. Hyperlipidemia 3. CAD: CABG 1997 and redo 11/12.  LHC (4/15) with totally occluded LAD, totally occluded RCA, 80% distal LM, 50% mLCx, 2 SVG-RCA grafts totally occluded, 2 SVG-OM grafts totally occluded, patent LIMA-LAD.  Cardiolite prior to 4/15 cath showed lateral wall ischemia (LCx territory).  PCI to distal LM would be a high risk procedure.  4. Ischemic Cardiomyopathy: Medtronic CRT-D device.  Echo (6/15) with EF  25-30%, moderate LV dilation, inferior and inferolateral akinesis, moderately decreased RV systolic function, mild MR.  RHC (11/15) with mean RA 5, PA 23/6, mean PCWP 9, CI 2.55.  5. H/o cholecystectomy 6. OA 7. Depression 8. Type II diabetes 9. GERD 10. CKD  SH: Married, prior smoker (many years ago), lives in [place].    FH: CAD  ROS: All systems reviewed and negative except as per HPI.   Current Outpatient Prescriptions  Medication Sig Dispense Refill  . allopurinol (ZYLOPRIM) 100 MG tablet Take 100 mg by mouth 2 (two) times daily.     Marland Kitchen aspirin EC 81 MG tablet Take 81 mg by mouth daily.    Marland Kitchen atorvastatin (LIPITOR) 40 MG tablet Take 40 mg by mouth daily at 6 PM.     . carvedilol (COREG) 6.25 MG tablet Take 1.5 tablets (9.375 mg total) by mouth 2 (two) times daily with a meal. 270 tablet 2  . clopidogrel (PLAVIX) 75 MG tablet Take 1 tablet (75 mg total) by mouth daily. 90 tablet 3  . eplerenone (INSPRA) 25 MG tablet Take 25 mg by mouth every morning.     . furosemide (LASIX) 40 MG tablet Take 1/2 tab in AM and 1 tab in PM    . insulin lispro protamine-lispro (HUMALOG 50/50 MIX) (50-50) 100 UNIT/ML SUSP injection Inject 20-30 Units into the skin 3 (three) times daily. Takes 30 units in the morning 30 at  lunch and 20 at night    . isosorbide mononitrate (IMDUR) 60 MG 24 hr tablet Take 1.5 tablets (90 mg total) by mouth at bedtime. 135 tablet 3  . omeprazole (PRILOSEC) 20 MG capsule Take 20 mg by mouth as needed.    . ONE TOUCH ULTRA TEST test strip     . ONETOUCH DELICA LANCETS 94H MISC     . ranolazine (RANEXA) 1000 MG SR tablet Take 1 tablet (1,000 mg total) by mouth 2 (two) times daily. 60 tablet 0  . sacubitril-valsartan (ENTRESTO) 24-26 MG Take 1 tablet by mouth 2 (two) times daily. 60 tablet 6  . venlafaxine (EFFEXOR-XR) 150 MG 24 hr capsule Take 150 mg by mouth at bedtime.     Marland Kitchen zolpidem (AMBIEN CR) 12.5 MG CR tablet Take 12.5 mg by mouth at bedtime as needed. For sleep    .  nitroGLYCERIN (NITROSTAT) 0.4 MG SL tablet Place 1 tablet (0.4 mg total) under the tongue every 5 (five) minutes x 3 doses as needed. For chest pain. (Patient not taking: Reported on 12/09/2014) 25 tablet 3   No current facility-administered medications for this encounter.   BP 110/60 mmHg  Pulse 78  Wt 215 lb 4 oz (97.637 kg)  SpO2 97% General: NAD Neck: JVP 7 cm, no thyromegaly or thyroid nodule.  Lungs: Clear to auscultation bilaterally with normal respiratory effort. CV: Nondisplaced PMI.  Heart regular S1/S2, no S3/S4, no murmur.  No edema.  No carotid bruit.  Normal pedal pulses.  Abdomen: Soft, nontender, no hepatosplenomegaly, no distention.  Skin: Intact without lesions or rashes.  Neurologic: Alert and oriented x 3.  Psych: Normal affect. Extremities: No clubbing or cyanosis.   Assessment/Plan: 1. Chronic systolic CHF: NYHA class II symptoms, stable.  EF 25-30% on last echo with moderate RV systolic dysfunction. He has a Medtronic CRT-D device.  Normal filling pressures and preserved cardiac index on RHC in 11/15.  Volume status good by exam and Optivol. - Continue current eplerenone and Entresto.  - Continue current Lasix.  - Increase Coreg to 9.375 mg bid.   2. CAD: s/p CABG and redo.  Most recently, LHC in 4/15 showed that all the SVGs were occluded but LIMA-LAD was patent.  Lateral wall ischemia on Cardiolite likely is explained by the 80% distal LM stenosis upstream from the LCx.  This was thought to be a difficult lesion for PCI and was managed medically.  No significant exertional chest pain.  Unfortunately, it looks like he will not be able to afford ranolazine for anginal control.  - Continue ASA 81, Plavix, statin.  -  For angina, increasing Coreg as above and will increase Imdur to 90 mg daily.  It looks like he will have stop dofetilide due to pricing.  .  3. CKD: Follow creatinine closely with BMET as above.  4. Hyperlipidemia: Excellent lipids in 9/15.   followup  in 3 months.    [name] 12/09/2014

## 2014-12-09 NOTE — Patient Instructions (Signed)
INCREASE Imdur to 90 mg, one and one half pill daily INCREASE Coreg to 9.375mg , one and one half tab twice a day  We are looking into getting a patient assistance program for the Renexa, as soon as we hear something from the drug company we will let you know. In the meantime please DISCONTINUE Renexa  Your physician recommends that you schedule a follow-up appointment in: 3 months

## 2014-12-14 ENCOUNTER — Ambulatory Visit: Payer: Medicare Other | Admitting: Endocrinology

## 2015-01-06 ENCOUNTER — Encounter: Payer: Self-pay | Admitting: *Deleted

## 2015-01-11 ENCOUNTER — Ambulatory Visit (INDEPENDENT_AMBULATORY_CARE_PROVIDER_SITE_OTHER): Payer: Managed Care, Other (non HMO) | Admitting: Internal Medicine

## 2015-01-11 ENCOUNTER — Encounter: Payer: Self-pay | Admitting: Internal Medicine

## 2015-01-11 VITALS — BP 118/70 | HR 91 | Ht 70.0 in | Wt 217.8 lb

## 2015-01-11 DIAGNOSIS — N183 Chronic kidney disease, stage 3 (moderate): Secondary | ICD-10-CM | POA: Diagnosis not present

## 2015-01-11 DIAGNOSIS — I5022 Chronic systolic (congestive) heart failure: Secondary | ICD-10-CM

## 2015-01-11 DIAGNOSIS — I255 Ischemic cardiomyopathy: Secondary | ICD-10-CM

## 2015-01-11 DIAGNOSIS — Z794 Long term (current) use of insulin: Secondary | ICD-10-CM | POA: Diagnosis not present

## 2015-01-11 DIAGNOSIS — F322 Major depressive disorder, single episode, severe without psychotic features: Secondary | ICD-10-CM | POA: Diagnosis not present

## 2015-01-11 DIAGNOSIS — Z6834 Body mass index (BMI) 34.0-34.9, adult: Secondary | ICD-10-CM | POA: Diagnosis not present

## 2015-01-11 DIAGNOSIS — Z23 Encounter for immunization: Secondary | ICD-10-CM | POA: Diagnosis not present

## 2015-01-11 DIAGNOSIS — I480 Paroxysmal atrial fibrillation: Secondary | ICD-10-CM | POA: Diagnosis not present

## 2015-01-11 DIAGNOSIS — E1121 Type 2 diabetes mellitus with diabetic nephropathy: Secondary | ICD-10-CM | POA: Diagnosis not present

## 2015-01-11 DIAGNOSIS — E669 Obesity, unspecified: Secondary | ICD-10-CM | POA: Diagnosis not present

## 2015-01-11 DIAGNOSIS — I25119 Atherosclerotic heart disease of native coronary artery with unspecified angina pectoris: Secondary | ICD-10-CM | POA: Diagnosis not present

## 2015-01-11 DIAGNOSIS — E780 Pure hypercholesterolemia: Secondary | ICD-10-CM | POA: Diagnosis not present

## 2015-01-11 DIAGNOSIS — I130 Hypertensive heart and chronic kidney disease with heart failure and stage 1 through stage 4 chronic kidney disease, or unspecified chronic kidney disease: Secondary | ICD-10-CM | POA: Diagnosis not present

## 2015-01-11 LAB — CUP PACEART INCLINIC DEVICE CHECK
Battery Voltage: 3.05 V
Brady Statistic AS VS Percent: 0.42 %
Brady Statistic RV Percent Paced: 99.57 %
HIGH POWER IMPEDANCE MEASURED VALUE: 209 Ohm
HIGH POWER IMPEDANCE MEASURED VALUE: 42 Ohm
HIGH POWER IMPEDANCE MEASURED VALUE: 52 Ohm
HighPow Impedance: 418 Ohm
Lead Channel Impedance Value: 399 Ohm
Lead Channel Impedance Value: 418 Ohm
Lead Channel Pacing Threshold Amplitude: 0.875 V
Lead Channel Pacing Threshold Amplitude: 1.125 V
Lead Channel Pacing Threshold Pulse Width: 0.4 ms
Lead Channel Sensing Intrinsic Amplitude: 1.375 mV
Lead Channel Sensing Intrinsic Amplitude: 31.625 mV
Lead Channel Sensing Intrinsic Amplitude: 31.625 mV
Lead Channel Setting Pacing Amplitude: 2.5 V
Lead Channel Setting Pacing Amplitude: 2.5 V
Lead Channel Setting Pacing Pulse Width: 0.4 ms
Lead Channel Setting Pacing Pulse Width: 0.4 ms
Lead Channel Setting Sensing Sensitivity: 0.3 mV
MDC IDC MSMT LEADCHNL LV IMPEDANCE VALUE: 361 Ohm
MDC IDC MSMT LEADCHNL LV IMPEDANCE VALUE: 665 Ohm
MDC IDC MSMT LEADCHNL LV PACING THRESHOLD PULSEWIDTH: 0.4 ms
MDC IDC MSMT LEADCHNL RA PACING THRESHOLD PULSEWIDTH: 0.4 ms
MDC IDC MSMT LEADCHNL RA SENSING INTR AMPL: 1.625 mV
MDC IDC MSMT LEADCHNL RV IMPEDANCE VALUE: 570 Ohm
MDC IDC MSMT LEADCHNL RV PACING THRESHOLD AMPLITUDE: 0.625 V
MDC IDC SESS DTM: 20160926144430
MDC IDC SET LEADCHNL LV PACING AMPLITUDE: 2 V
MDC IDC SET ZONE DETECTION INTERVAL: 350 ms
MDC IDC STAT BRADY AP VP PERCENT: 0.3 %
MDC IDC STAT BRADY AP VS PERCENT: 0.01 %
MDC IDC STAT BRADY AS VP PERCENT: 99.27 %
MDC IDC STAT BRADY RA PERCENT PACED: 0.31 %
Zone Setting Detection Interval: 300 ms
Zone Setting Detection Interval: 350 ms
Zone Setting Detection Interval: 450 ms

## 2015-01-11 NOTE — Progress Notes (Signed)
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  Since his last office visit, the patient reports doing well.  He remains active. He has recently noticed some orthopnea as well as worsening edema. Today, he denies symptoms of palpitations, chest pain, presyncope, syncope, or ICD shocks.  The patient is otherwise without complaint today.   Past Medical History  Diagnosis Date  . Ischemic cardiomyopathy     s/p ICD Implantation by Dr Leonia Reeves  . Chronic systolic dysfunction of left ventricle     EF 30%  . Hyperlipemia   . DJD (degenerative joint disease)   . Gout   . HTN (hypertension) 02/14/2011  . Depression   . Angina   . Myocardial infarction 1997  . Cardiac defibrillator in situ   . DM (diabetes mellitus) 02/14/2011  . CHF (congestive heart failure)     EF 30% by cath 05/2011  . ICD (implantable cardiac defibrillator) in place   . GERD (gastroesophageal reflux disease)   . Heart attack   . Complication of anesthesia     ONCE WITH BACK SURGERY DIFFICULT TO WAKE UP  . CAD (coronary artery disease)     s/p CABG 1997, PCI (BMS) of SVG to RCA 3/09, redo CABG 02/2011 with SVG to PDA, SVG to Lcx, s/p cath 2.2013 with ocluded SVT to left circ and patent SVG to RCA.  Cath 07/2013 Recent cath showed ischemic cardiomyopathy with LVEF less than 20%, with progressive LV dysfunction related to bypass graft failure, occlusion of the saphenous vein grafts placed in 2012 to the right coronary and to the circum  . Paroxysmal atrial fibrillation 10/22/14    single episode of AF x 3 hours 48 minutes recorded on ICD, chads2vasc score is at least 5    Past Surgical History  Procedure Laterality Date  . Cardiac defibrillator placement  08/2004    initial placement, upgraded to [place] ICD by Dr Lovena Le 08/24/11 (MDT)  . Knee arthrotomy  ~ 1978    RIGHT KNEE CARTILAGE REMOVED  . Coronary angioplasty with stent  placement  06/2007    BMS to SVG to RCA  . Coronary angioplasty  02/2011  . Cataract extraction w/ intraocular lens  implant, bilateral  2012  . Cardiac defibrillator placement  2009  . Lumbar disc surgery  2003  . Back surgery  2003  . Coronary artery bypass graft  01/1996    CABG x 5 LIMA to LAD SVG to diag1,2,svg to om,svg to RCA  . Coronary artery bypass graft  03/06/2011    CABG X2; Procedure: REDO CORONARY ARTERY BYPASS GRAFTING (CABG);  Surgeon: Grace Isaac, MD;  Location: [facility];  Service: Open Heart Surgery;  Laterality: N/A;  times two grafts using right saphenous vein harvested endoscopically.  . Cholecystectomy  01/05/2012    Procedure: LAPAROSCOPIC CHOLECYSTECTOMY WITH INTRAOPERATIVE CHOLANGIOGRAM;  Surgeon: Joyice Faster. Cornett, MD;  Location: [facility];  Service: General;  Laterality: N/A;  laparoscopic cholecysectoym with intraoperative cholangiogram  . Colonoscopy with propofol N/A 11/18/2013    Procedure: COLONOSCOPY WITH PROPOFOL;  Surgeon: Garlan Fair, MD;  Location: [facility] ENDOSCOPY;  Service: Endoscopy;  Laterality: N/A;  . Left heart catheterization with coronary angiogram N/A 06/06/2011    Procedure: LEFT HEART CATHETERIZATION WITH CORONARY ANGIOGRAM;  Surgeon: Sueanne Margarita, MD;  Location: [facility] CATH LAB;  Service: Cardiovascular;  Laterality: N/A;  . Venogram N/A 06/08/2011  Procedure: VENOGRAM;  Surgeon: Thompson Grayer, MD;  Location: [facility] CATH LAB;  Service: Cardiovascular;  Laterality: N/A;  . Bi-ventricular implantable cardioverter defibrillator upgrade N/A 08/24/2011    Procedure: BI-VENTRICULAR IMPLANTABLE CARDIOVERTER DEFIBRILLATOR UPGRADE;  Surgeon: Evans Lance, MD;  Location: [facility] CATH LAB;  Service: Cardiovascular;  Laterality: N/A;  . Left heart catheterization with coronary/graft angiogram N/A 08/08/2013    Procedure: LEFT HEART CATHETERIZATION WITH Beatrix Fetters;  Surgeon: Sinclair Grooms, MD;  Location: [facility] CATH LAB;  Service: Cardiovascular;  Laterality:  N/A;  . Right heart catheterization N/A 02/17/2014    Procedure: RIGHT HEART CATH;  Surgeon: Larey Dresser, MD;  Location: [facility] CATH LAB;  Service: Cardiovascular;  Laterality: N/A;    Current Outpatient Prescriptions  Medication Sig Dispense Refill  . allopurinol (ZYLOPRIM) 100 MG tablet Take 100 mg by mouth 2 (two) times daily.     Marland Kitchen aspirin EC 81 MG tablet Take 81 mg by mouth daily.    Marland Kitchen atorvastatin (LIPITOR) 40 MG tablet Take 40 mg by mouth daily at 6 PM.     . carvedilol (COREG) 6.25 MG tablet Take 1.5 tablets (9.375 mg total) by mouth 2 (two) times daily with a meal. 270 tablet 2  . clopidogrel (PLAVIX) 75 MG tablet Take 1 tablet (75 mg total) by mouth daily. 90 tablet 3  . eplerenone (INSPRA) 25 MG tablet Take 25 mg by mouth every morning.     . furosemide (LASIX) 40 MG tablet Take 1/2 tablet by mouth in AM and 1 tablet in PM    . insulin lispro protamine-lispro (HUMALOG 50/50 MIX) (50-50) 100 UNIT/ML SUSP injection Inject 20-30 Units into the skin 3 (three) times daily. Takes 30 units in the morning 30 at lunch and 20 at night    . isosorbide mononitrate (IMDUR) 60 MG 24 hr tablet Take 1.5 tablets (90 mg total) by mouth at bedtime. 135 tablet 3  . nitroGLYCERIN (NITROSTAT) 0.4 MG SL tablet Place 1 tablet (0.4 mg total) under the tongue every 5 (five) minutes x 3 doses as needed. For chest pain. 25 tablet 3  . omeprazole (PRILOSEC) 20 MG capsule Take 20 mg by mouth daily as needed (heartburn or acid reflux).     . ONE TOUCH ULTRA TEST test strip     . ONETOUCH DELICA LANCETS 62Z MISC     . ranolazine (RANEXA) 1000 MG SR tablet Take 1 tablet (1,000 mg total) by mouth 2 (two) times daily. 60 tablet 0  . sacubitril-valsartan (ENTRESTO) 24-26 MG Take 1 tablet by mouth 2 (two) times daily. 60 tablet 6  . venlafaxine (EFFEXOR-XR) 150 MG 24 hr capsule Take 150 mg by mouth at bedtime.     Marland Kitchen zolpidem (AMBIEN CR) 12.5 MG CR tablet Take 12.5 mg by mouth at bedtime as needed. For sleep     No  current facility-administered medications for this visit.    Physical Exam: Filed Vitals:   01/11/15 1332  BP: 118/70  Pulse: 91  Height: 5\' 10"  (1.778 m)  Weight: 217 lb 12.8 oz (98.793 kg)  SpO2: 95%    GEN- The patient is well appearing, alert and oriented x 3 today.   Head- normocephalic, atraumatic Eyes-  Sclera clear, conjunctiva pink Ears- hearing intact Oropharynx- clear Lungs- Clear to ausculation bilaterally, normal work of breathing Chest- ICD pocket is well healed, skin over the incision is thin but not retracted Heart- Regular rate and rhythm, no murmurs, rubs or gallops, PMI not laterally displaced  GI- soft, NT, ND, + BS Extremities- no clubbing, cyanosis, or edema  ICD interrogation- reviewed in detail today,  See PACEART report   Assessment and Plan:  1. Chronic systolic dysfunction Volume overloaded today optivol is elevated Increase lasix x 3 days then resume to baseline Normal BiV ICD function See [facility] report No changes today Continue in ICM device clinic Underwent AV optimization of his device 6/15.  I do not believe that we have any additional optimization options for his device  2. HTN Stable No change required today  3. afib Currently controlled On ASA and plavix Would consider anticoagulation if further afib occurs  carelink Return in 1 year to see EP NP  Thompson Grayer MD, [facility] 01/11/2015 10:02 PM

## 2015-01-11 NOTE — Patient Instructions (Addendum)
Medication Instructions: 1) Increase lasix (furosemide) to 40 mg one whole tablet by mouth twice daily x 3 days, then resume your normal dosing  Labwork: - none  Procedures/Testing: - none  Follow-Up: - Remote monitoring is used to monitor your Pacemaker of ICD from home. This monitoring reduces the number of office visits required to check your device to one time per year. It allows [place] to keep an eye on the functioning of your device to ensure it is working properly. You are scheduled for a device check from home on Monday 01/25/15 (fluid check only) & 04/13/15. You may send your transmission at any time that day. If you have a wireless device, the transmission will be sent automatically. After your physician reviews your transmission, you will receive a postcard with your next transmission date.  - Your physician wants you to follow-up in: 1 year with Chanetta Marshall, NP for Dr. Rayann Heman. You will receive a reminder letter in the mail two months in advance. If you don't receive a letter, please call our office to schedule the follow-up appointment.  Any Additional Special Instructions Will Be Listed Below (If Applicable).

## 2015-01-21 ENCOUNTER — Encounter: Payer: Self-pay | Admitting: Internal Medicine

## 2015-01-25 ENCOUNTER — Telehealth: Payer: Self-pay

## 2015-01-25 NOTE — Telephone Encounter (Signed)
Telephone call to patient regarding 1st ICM transmission.  I stated the transmission was not sent today.  I advised he could send a manual transmission or I could reschedule since he had office check with Dr Rayann Heman on 01/11/2015.  He said to reschedule.  I stated I would send him a letter with the new date.

## 2015-02-17 ENCOUNTER — Encounter (HOSPITAL_COMMUNITY): Payer: Self-pay

## 2015-02-17 ENCOUNTER — Encounter: Payer: Managed Care, Other (non HMO) | Admitting: *Deleted

## 2015-02-17 ENCOUNTER — Other Ambulatory Visit: Payer: Self-pay

## 2015-02-17 ENCOUNTER — Ambulatory Visit (HOSPITAL_COMMUNITY)
Admission: RE | Admit: 2015-02-17 | Discharge: 2015-02-17 | Disposition: A | Discharge status: Home / Self Care | Payer: Managed Care, Other (non HMO) | Source: Ambulatory Visit | Attending: Cardiology | Admitting: Cardiology

## 2015-02-17 ENCOUNTER — Telehealth: Payer: Self-pay | Admitting: Cardiology

## 2015-02-17 VITALS — BP 112/68 | HR 84 | Wt 218.5 lb

## 2015-02-17 DIAGNOSIS — K219 Gastro-esophageal reflux disease without esophagitis: Secondary | ICD-10-CM | POA: Insufficient documentation

## 2015-02-17 DIAGNOSIS — Z87891 Personal history of nicotine dependence: Secondary | ICD-10-CM | POA: Insufficient documentation

## 2015-02-17 DIAGNOSIS — Z7902 Long term (current) use of antithrombotics/antiplatelets: Secondary | ICD-10-CM | POA: Insufficient documentation

## 2015-02-17 DIAGNOSIS — R079 Chest pain, unspecified: Secondary | ICD-10-CM

## 2015-02-17 DIAGNOSIS — Z79899 Other long term (current) drug therapy: Secondary | ICD-10-CM | POA: Diagnosis not present

## 2015-02-17 DIAGNOSIS — E1122 Type 2 diabetes mellitus with diabetic chronic kidney disease: Secondary | ICD-10-CM | POA: Insufficient documentation

## 2015-02-17 DIAGNOSIS — I2510 Atherosclerotic heart disease of native coronary artery without angina pectoris: Secondary | ICD-10-CM | POA: Diagnosis not present

## 2015-02-17 DIAGNOSIS — N189 Chronic kidney disease, unspecified: Secondary | ICD-10-CM | POA: Diagnosis not present

## 2015-02-17 DIAGNOSIS — Z794 Long term (current) use of insulin: Secondary | ICD-10-CM | POA: Insufficient documentation

## 2015-02-17 DIAGNOSIS — I5022 Chronic systolic (congestive) heart failure: Secondary | ICD-10-CM | POA: Insufficient documentation

## 2015-02-17 DIAGNOSIS — Z7982 Long term (current) use of aspirin: Secondary | ICD-10-CM | POA: Insufficient documentation

## 2015-02-17 DIAGNOSIS — E785 Hyperlipidemia, unspecified: Secondary | ICD-10-CM | POA: Diagnosis not present

## 2015-02-17 DIAGNOSIS — Z8249 Family history of ischemic heart disease and other diseases of the circulatory system: Secondary | ICD-10-CM | POA: Diagnosis not present

## 2015-02-17 DIAGNOSIS — Z95810 Presence of automatic (implantable) cardiac defibrillator: Secondary | ICD-10-CM | POA: Diagnosis not present

## 2015-02-17 DIAGNOSIS — Z951 Presence of aortocoronary bypass graft: Secondary | ICD-10-CM | POA: Insufficient documentation

## 2015-02-17 DIAGNOSIS — I209 Angina pectoris, unspecified: Secondary | ICD-10-CM

## 2015-02-17 DIAGNOSIS — I255 Ischemic cardiomyopathy: Secondary | ICD-10-CM | POA: Diagnosis not present

## 2015-02-17 LAB — LIPID PANEL
Cholesterol: 147 mg/dL (ref 0–200)
HDL: 32 mg/dL — ABNORMAL LOW (ref >40)
LDL Cholesterol: 80 mg/dL (ref 0–99)
Total CHOL/HDL Ratio: 4.6 RATIO
Triglycerides: 176 mg/dL — ABNORMAL HIGH (ref <150)
VLDL: 35 mg/dL (ref 0–40)

## 2015-02-17 LAB — BASIC METABOLIC PANEL
Anion gap: 8 (ref 5–15)
BUN: 15 mg/dL (ref 6–20)
CO2: 25 mmol/L (ref 22–32)
Calcium: 8.9 mg/dL (ref 8.9–10.3)
Chloride: 107 mmol/L (ref 101–111)
Creatinine, Ser: 1.15 mg/dL (ref 0.61–1.24)
GFR calc Af Amer: >60 mL/min (ref >60)
GFR calc non Af Amer: >60 mL/min (ref >60)
Glucose, Bld: 201 mg/dL — ABNORMAL HIGH (ref 65–99)
Potassium: 4.3 mmol/L (ref 3.5–5.1)
Sodium: 140 mmol/L (ref 135–145)

## 2015-02-17 LAB — BRAIN NATRIURETIC PEPTIDE: B NATRIURETIC PEPTIDE 5: 289.4 pg/mL — AB (ref 0.0–100.0)

## 2015-02-17 MED ORDER — CARVEDILOL 12.5 MG PO TABS: 12.5000 mg | ORAL_TABLET | Freq: Two times a day (BID) | ORAL | Status: DC

## 2015-02-17 MED ORDER — FUROSEMIDE 40 MG PO TABS: 40.0000 mg | ORAL_TABLET | Freq: Every day | ORAL | Status: DC

## 2015-02-17 MED ORDER — RANOLAZINE ER 500 MG PO TB12: 500.0000 mg | ORAL_TABLET | Freq: Two times a day (BID) | ORAL | Status: DC

## 2015-02-17 NOTE — Progress Notes (Signed)
Patient ID: [name], male   DOB: 02-01-1941, 74 y.o.   MRN: [id number] PCP: Dr. Lynnda Child Primary Cardiologist: Dr. Radford Pax  74 yo with history of CAD s/p CABG and redo CABG as well as ischemic cardiomyopathy with CRT-D device presents for CHF clinic evaluation.  He had a Cardiolite in the 4/15 showing lateral wall ischemia.  LHC at that time showed all his vein grafts from both CABG surgeries occluded.  The LIMA-LAD was patent and there was a 90% distal LM stenosis.  The lateral ischemia likely corresponded to LCx territory downstream from the LM stenosis.  PCI of the distal LM was thought to be high risk and characteristics of the lesion were not favorable for PCI.  Last echo showed EF 25-30% with moderate RV systolic dysfunction.  He had RHC in 11/15 with normal filling pressures and relatively preserved cardiac index (2.55).    Several weeks ago, he was having increased exertional chest pain.  He started on Ranexa (son who is a pharmacist was able to help, now getting med assistance), and the exertional chest pain has mostly resolved. However, he had a bad episode of chest pain Sunday night while sitting on the sofa watching TV after eating a taco. It gradually went away, NTG did not have much effect.  He is short of breath after walking about 1/2 mile, ok with walking up a flight of steps.  No orthopnea or PND.  Weight is up 3 lbs.    Optivol was checked today.  Fluid index < threshold but thoracic impedance is decreasing.   Labs (9/15): LDL particle number 816, LDL 57, LFTs normal Labs (10/15): K 4.4, creatinine 1.4 Labs (11/15): K 4.3, creatinine 1.36 Labs (12/15): K 4.8, creatinine 1.34 Labs (3/16): K 4, creatinine 1.38, LDL 46, HDl 35 Labs (7/16): K 4, creatinine 1.39, BNP 211  ECG: NSR, BiV paced  PMH: 1. Gout 2. Hyperlipidemia 3. CAD: CABG 1997 and redo 11/12.  LHC (4/15) with totally occluded LAD, totally occluded RCA, 80% distal LM, 50% mLCx, 2 SVG-RCA grafts totally occluded, 2  SVG-OM grafts totally occluded, patent LIMA-LAD.  Cardiolite prior to 4/15 cath showed lateral wall ischemia (LCx territory).  PCI to distal LM would be a high risk procedure.  4. Ischemic Cardiomyopathy: Medtronic CRT-D device.  Echo (6/15) with EF 25-30%, moderate LV dilation, inferior and inferolateral akinesis, moderately decreased RV systolic function, mild MR.  RHC (11/15) with mean RA 5, PA 23/6, mean PCWP 9, CI 2.55.  5. H/o cholecystectomy 6. OA 7. Depression 8. Type II diabetes 9. GERD 10. CKD  SH: Married, prior smoker (many years ago), lives in [place].    FH: CAD  ROS: All systems reviewed and negative except as per HPI.   Current Outpatient Prescriptions  Medication Sig Dispense Refill  . allopurinol (ZYLOPRIM) 100 MG tablet Take 100 mg by mouth 2 (two) times daily.     Marland Kitchen aspirin EC 81 MG tablet Take 81 mg by mouth daily.    Marland Kitchen atorvastatin (LIPITOR) 40 MG tablet Take 40 mg by mouth daily at 6 PM.     . clopidogrel (PLAVIX) 75 MG tablet Take 1 tablet (75 mg total) by mouth daily. 90 tablet 3  . eplerenone (INSPRA) 25 MG tablet Take 25 mg by mouth every morning.     . insulin lispro protamine-lispro (HUMALOG 50/50 MIX) (50-50) 100 UNIT/ML SUSP injection Inject 20-30 Units into the skin 3 (three) times daily. Takes 30 units in the morning 30  at lunch and 20 at night    . isosorbide mononitrate (IMDUR) 60 MG 24 hr tablet Take 1.5 tablets (90 mg total) by mouth at bedtime. 135 tablet 3  . nitroGLYCERIN (NITROSTAT) 0.4 MG SL tablet Place 1 tablet (0.4 mg total) under the tongue every 5 (five) minutes x 3 doses as needed. For chest pain. 25 tablet 3  . omeprazole (PRILOSEC) 20 MG capsule Take 20 mg by mouth daily as needed (heartburn or acid reflux).     . ONE TOUCH ULTRA TEST test strip     . ONETOUCH DELICA LANCETS 07M MISC     . sacubitril-valsartan (ENTRESTO) 49-51 MG Take 1 tablet by mouth 2 (two) times daily.    Marland Kitchen venlafaxine (EFFEXOR-XR) 150 MG 24 hr capsule Take 150 mg  by mouth at bedtime.     Marland Kitchen zolpidem (AMBIEN CR) 12.5 MG CR tablet Take 12.5 mg by mouth at bedtime as needed. For sleep    . carvedilol (COREG) 12.5 MG tablet Take 1 tablet (12.5 mg total) by mouth 2 (two) times daily. 180 tablet 3  . furosemide (LASIX) 40 MG tablet Take 1 tablet (40 mg total) by mouth daily. 30 tablet 3  . ranolazine (RANEXA) 500 MG 12 hr tablet Take 1 tablet (500 mg total) by mouth 2 (two) times daily. 30 tablet 3   No current facility-administered medications for this encounter.   BP 112/68 mmHg  Pulse 84  Wt 218 lb 8 oz (99.111 kg)  SpO2 98% General: NAD Neck: JVP 8-9 cm, no thyromegaly or thyroid nodule.  Lungs: Clear to auscultation bilaterally with normal respiratory effort. CV: Nondisplaced PMI.  Heart regular S1/S2, no S3/S4, no murmur.  1+ edema 1/2 up lower legs bilaterally.  No carotid bruit.  Normal pedal pulses.  Abdomen: Soft, nontender, no hepatosplenomegaly, no distention.  Skin: Intact without lesions or rashes.  Neurologic: Alert and oriented x 3.  Psych: Normal affect. Extremities: No clubbing or cyanosis.   Assessment/Plan: 1. Chronic systolic CHF: NYHA class II symptoms, stable.  EF 25-30% on last echo with moderate RV systolic dysfunction. He has a Medtronic CRT-D device.  Normal filling pressures and preserved cardiac index on RHC in 11/15.  Mild volume overload by exam, Optivol impedance trending down, and weight up.  - Continue current eplerenone and Entresto.  - Increase Lasix to 40 mg bid.  Check BMET/BNP today and again in 10 days.   - Increase Coreg to 12.5 mg bid with angina recently.   2. CAD: s/p CABG and redo.  Most recently, LHC in 4/15 showed that all the SVGs were occluded but LIMA-LAD was patent.  Lateral wall ischemia on Cardiolite likely is explained by the 80% distal LM stenosis upstream from the LCx.  This was thought to be a difficult lesion for PCI and was managed medically.  He was having more angina a few weeks ago but this was  resolved with addition of ranolazine.  He had an episode of chest pain last Sunday that may have been related to GERD. - Continue ASA 81, Plavix, statin.  - Continue Imdur and increasing Coreg as above. - Continue ranolazine 500 mg bid.  - Will arrange for Lexiscan Cardiolite to make sure that there are no new perfusion defects compared to the prior study.   3. CKD: Follow creatinine closely with BMET as above.  4. Hyperlipidemia: Check lipids today.   followup in 3-4 weeks.    [name] 02/17/2015

## 2015-02-17 NOTE — Patient Instructions (Addendum)
Medication: Increase Lasix 40 mg twice a day Increase Carvedilol 12.5 mg twice a day Decrease Ranexa 500 mg twice a day  Labs: Labs today will call if abnormal  Labs in 10 days  We will set you up for a stress test to be done at our [address] office

## 2015-02-17 NOTE — Telephone Encounter (Signed)
Attempted to confirm remote transmission with pt. No answer and was unable to leave a message.   

## 2015-02-18 ENCOUNTER — Telehealth: Payer: Self-pay

## 2015-02-18 NOTE — Telephone Encounter (Signed)
Call to patient to discuss ICM transmission.  He stated he thought the transmission was sent on 02/17/2015 and advised it was not received.  Explained since he had appointment with CHF clinic on 02/17/2015 and follow up on 03/17/2015 then I would reschedule 1st ICM transmission for 04/13/2015.  He was agreeable.

## 2015-02-23 ENCOUNTER — Telehealth (HOSPITAL_COMMUNITY): Payer: Self-pay | Admitting: *Deleted

## 2015-02-23 NOTE — Telephone Encounter (Signed)
Patient given detailed instructions per Myocardial Perfusion Study Information Sheet for the test on 02/25/15 at 0730. Patient notified to arrive 15 minutes early and that it is imperative to arrive on time for appointment to keep from having the test rescheduled.  If you need to cancel or reschedule your appointment, please call the office within 24 hours of your appointment. Failure to do so may result in a cancellation of your appointment, and a $50 no show fee. Patient verbalized understanding.Veyda Kaufman, Ranae Palms

## 2015-02-25 ENCOUNTER — Ambulatory Visit (HOSPITAL_COMMUNITY): Payer: Managed Care, Other (non HMO) | Attending: Cardiology

## 2015-02-25 DIAGNOSIS — I517 Cardiomegaly: Secondary | ICD-10-CM | POA: Diagnosis not present

## 2015-02-25 DIAGNOSIS — R9439 Abnormal result of other cardiovascular function study: Secondary | ICD-10-CM | POA: Insufficient documentation

## 2015-02-25 DIAGNOSIS — I10 Essential (primary) hypertension: Secondary | ICD-10-CM | POA: Diagnosis not present

## 2015-02-25 DIAGNOSIS — R0609 Other forms of dyspnea: Secondary | ICD-10-CM | POA: Diagnosis not present

## 2015-02-25 DIAGNOSIS — R079 Chest pain, unspecified: Secondary | ICD-10-CM | POA: Diagnosis not present

## 2015-02-25 DIAGNOSIS — E119 Type 2 diabetes mellitus without complications: Secondary | ICD-10-CM | POA: Diagnosis not present

## 2015-02-25 LAB — MYOCARDIAL PERFUSION IMAGING
CHL CUP RESTING HR STRESS: 80 {beats}/min
CSEPPHR: 104 {beats}/min
LV dias vol: 281 mL
LVSYSVOL: 225 mL
RATE: 0.39
SDS: 4
SRS: 24
SSS: 28
TID: 1.13

## 2015-02-25 MED ORDER — TECHNETIUM TC 99M SESTAMIBI GENERIC - CARDIOLITE
32.1000 | Freq: Once | INTRAVENOUS | Status: AC | PRN
  Administered 2015-02-25: 32.1 via INTRAVENOUS

## 2015-02-25 MED ORDER — REGADENOSON 0.4 MG/5ML IV SOLN
0.4000 mg | Freq: Once | INTRAVENOUS | Status: AC
  Administered 2015-02-25: 0.4 mg via INTRAVENOUS

## 2015-02-25 MED ORDER — TECHNETIUM TC 99M SESTAMIBI GENERIC - CARDIOLITE
10.6000 | Freq: Once | INTRAVENOUS | Status: AC | PRN
  Administered 2015-02-25: 11 via INTRAVENOUS

## 2015-03-02 ENCOUNTER — Telehealth (HOSPITAL_COMMUNITY): Payer: Self-pay

## 2015-03-02 NOTE — Telephone Encounter (Signed)
Called for Perfusion result studies

## 2015-03-03 ENCOUNTER — Ambulatory Visit (HOSPITAL_COMMUNITY)
Admission: RE | Admit: 2015-03-03 | Discharge: 2015-03-03 | Disposition: A | Discharge status: Home / Self Care | Payer: Managed Care, Other (non HMO) | Source: Ambulatory Visit | Attending: Cardiology | Admitting: Cardiology

## 2015-03-03 DIAGNOSIS — I480 Paroxysmal atrial fibrillation: Secondary | ICD-10-CM | POA: Diagnosis not present

## 2015-03-03 LAB — BASIC METABOLIC PANEL
Anion gap: 8 (ref 5–15)
BUN: 22 mg/dL — ABNORMAL HIGH (ref 6–20)
CHLORIDE: 101 mmol/L (ref 101–111)
CO2: 29 mmol/L (ref 22–32)
Calcium: 8.2 mg/dL — ABNORMAL LOW (ref 8.9–10.3)
Creatinine, Ser: 1.47 mg/dL — ABNORMAL HIGH (ref 0.61–1.24)
GFR, EST AFRICAN AMERICAN: 53 mL/min — AB (ref >60)
GFR, EST NON AFRICAN AMERICAN: 46 mL/min — AB (ref >60)
Glucose, Bld: 237 mg/dL — ABNORMAL HIGH (ref 65–99)
Potassium: 4.1 mmol/L (ref 3.5–5.1)
SODIUM: 138 mmol/L (ref 135–145)

## 2015-03-03 LAB — BRAIN NATRIURETIC PEPTIDE: B NATRIURETIC PEPTIDE 5: 227.2 pg/mL — AB (ref 0.0–100.0)

## 2015-03-17 ENCOUNTER — Encounter (HOSPITAL_COMMUNITY): Payer: Self-pay

## 2015-03-17 ENCOUNTER — Ambulatory Visit (HOSPITAL_COMMUNITY)
Admission: RE | Admit: 2015-03-17 | Discharge: 2015-03-17 | Disposition: A | Discharge status: Home / Self Care | Payer: Managed Care, Other (non HMO) | Source: Ambulatory Visit | Attending: Cardiology | Admitting: Cardiology

## 2015-03-17 VITALS — BP 112/56 | HR 92 | Wt 213.0 lb

## 2015-03-17 DIAGNOSIS — Z951 Presence of aortocoronary bypass graft: Secondary | ICD-10-CM | POA: Insufficient documentation

## 2015-03-17 DIAGNOSIS — Z87891 Personal history of nicotine dependence: Secondary | ICD-10-CM | POA: Insufficient documentation

## 2015-03-17 DIAGNOSIS — I25119 Atherosclerotic heart disease of native coronary artery with unspecified angina pectoris: Secondary | ICD-10-CM | POA: Diagnosis not present

## 2015-03-17 DIAGNOSIS — M109 Gout, unspecified: Secondary | ICD-10-CM | POA: Insufficient documentation

## 2015-03-17 DIAGNOSIS — I252 Old myocardial infarction: Secondary | ICD-10-CM | POA: Insufficient documentation

## 2015-03-17 DIAGNOSIS — Z79899 Other long term (current) drug therapy: Secondary | ICD-10-CM | POA: Diagnosis not present

## 2015-03-17 DIAGNOSIS — I5022 Chronic systolic (congestive) heart failure: Secondary | ICD-10-CM | POA: Insufficient documentation

## 2015-03-17 DIAGNOSIS — I2582 Chronic total occlusion of coronary artery: Secondary | ICD-10-CM | POA: Diagnosis not present

## 2015-03-17 DIAGNOSIS — I25118 Atherosclerotic heart disease of native coronary artery with other forms of angina pectoris: Secondary | ICD-10-CM

## 2015-03-17 DIAGNOSIS — Z7982 Long term (current) use of aspirin: Secondary | ICD-10-CM | POA: Diagnosis not present

## 2015-03-17 DIAGNOSIS — I255 Ischemic cardiomyopathy: Secondary | ICD-10-CM | POA: Diagnosis not present

## 2015-03-17 DIAGNOSIS — Z794 Long term (current) use of insulin: Secondary | ICD-10-CM | POA: Insufficient documentation

## 2015-03-17 DIAGNOSIS — N189 Chronic kidney disease, unspecified: Secondary | ICD-10-CM | POA: Insufficient documentation

## 2015-03-17 DIAGNOSIS — E1122 Type 2 diabetes mellitus with diabetic chronic kidney disease: Secondary | ICD-10-CM | POA: Insufficient documentation

## 2015-03-17 DIAGNOSIS — E785 Hyperlipidemia, unspecified: Secondary | ICD-10-CM | POA: Insufficient documentation

## 2015-03-17 DIAGNOSIS — R42 Dizziness and giddiness: Secondary | ICD-10-CM | POA: Insufficient documentation

## 2015-03-17 DIAGNOSIS — Z8249 Family history of ischemic heart disease and other diseases of the circulatory system: Secondary | ICD-10-CM | POA: Diagnosis not present

## 2015-03-17 DIAGNOSIS — F329 Major depressive disorder, single episode, unspecified: Secondary | ICD-10-CM | POA: Diagnosis not present

## 2015-03-17 DIAGNOSIS — K219 Gastro-esophageal reflux disease without esophagitis: Secondary | ICD-10-CM | POA: Insufficient documentation

## 2015-03-17 MED ORDER — EPLERENONE 25 MG PO TABS: 25.0000 mg | ORAL_TABLET | Freq: Every evening | ORAL | Status: DC

## 2015-03-17 MED ORDER — COLCHICINE 0.6 MG PO TABS: 0.6000 mg | ORAL_TABLET | Freq: Every day | ORAL | Status: DC | PRN

## 2015-03-17 NOTE — Progress Notes (Signed)
Advanced Heart Failure Clinic Note   Patient ID: [name], male   DOB: 04-29-1940, 74 y.o.   MRN: [id number] PCP: Dr. Lynnda Child Primary Cardiologist: Dr. Radford Pax Primary HF: Dr Aundra Dubin  74 yo with history of CAD s/p CABG and redo CABG as well as ischemic cardiomyopathy with CRT-D device presents for CHF clinic evaluation.  He had a Cardiolite in the 4/15 showing lateral wall ischemia.  LHC at that time showed all his vein grafts from both CABG surgeries occluded.  The LIMA-LAD was patent and there was a 90% distal LM stenosis.  The lateral ischemia likely corresponded to LCx territory downstream from the LM stenosis.  PCI of the distal LM was thought to be high risk and characteristics of the lesion were not favorable for PCI.  Last echo showed EF 25-30% with moderate RV systolic dysfunction.  He had RHC in 11/15 with normal filling pressures and relatively preserved cardiac index (2.55).    He reports today for regular follow up. At last visit we increased lasix and coreg.  Denies any further CP.  Down 5 lbs since last visit. Has felt occasional dizziness/lightheadedness with standing since increase in coreg.  Feels a little weak today. Says he has had bad gout flare for past week and hasn't been walking around as much. Walks 1/2 mile with no SOB. Breathing is better since increasing Lasix at last appointment.  Walks on flat ground as long as he wants without problem. 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 No bendopnea, orthopnea or PND.  Optivol checked today: Fluid index < threshold. Thoracic impedance up. Activity down to about 1 - 1.5 hr over past few weeks.  Labs (9/15): LDL particle number 816, LDL 57, LFTs normal Labs (10/15): K 4.4, creatinine 1.4 Labs (11/15): K 4.3, creatinine 1.36 Labs (12/15): K 4.8, creatinine 1.34 Labs (3/16): K 4, creatinine 1.38, LDL 46, HDl 35 Labs (7/16): K 4, creatinine 1.39, BNP 211 Labs (03/03/15): K 4.1, creatinine 1.47, BNP 227, LDL 80,  HDL 32  PMH: 1. Gout 2. Hyperlipidemia 3. CAD: CABG 1997 and redo 11/12.  LHC (4/15) with totally occluded LAD, totally occluded RCA, 80% distal LM, 50% mLCx, 2 SVG-RCA grafts totally occluded, 2 SVG-OM grafts totally occluded, patent LIMA-LAD.  Cardiolite prior to 4/15 cath showed lateral wall ischemia (LCx territory).  PCI to distal LM would be a high risk procedure.  Cardiolite (8/16) with EF 20%, severe scar in RCA and probably LCx territory, minimal peri-infarct ischemia.  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 Medical management.  4. Ischemic Cardiomyopathy: Medtronic CRT-D device.  Echo (6/15) with EF 25-30%, moderate LV dilation, inferior and inferolateral akinesis, moderately decreased RV systolic function, mild MR.  RHC (11/15) with mean RA 5, PA 23/6, mean PCWP 9, CI 2.55.  5. H/o cholecystectomy 6. OA 7. Depression 8. Type II diabetes 9. GERD 10. CKD  SH: Married, prior smoker (many years ago), lives in [place].    FH: CAD  ROS: All systems reviewed and negative except as per HPI.   Current Outpatient Prescriptions  Medication Sig Dispense Refill  . allopurinol (ZYLOPRIM) 100 MG tablet Take 100 mg by mouth 2 (two) times daily.     Marland Kitchen aspirin EC 81 MG tablet Take 81 mg by mouth daily.    Marland Kitchen atorvastatin (LIPITOR) 40 MG tablet Take 40 mg by mouth daily at 6 PM.     . carvedilol (COREG) 12.5 MG tablet Take 1 tablet (  12.5 mg total) by mouth 2 (two) times daily. 180 tablet 3  . clopidogrel (PLAVIX) 75 MG tablet Take 1 tablet (75 mg total) by mouth daily. 90 tablet 3  . eplerenone (INSPRA) 25 MG tablet Take 25 mg by mouth every morning.     . furosemide (LASIX) 40 MG tablet Take 1 tablet (40 mg total) by mouth daily. 30 tablet 3  . insulin lispro protamine-lispro (HUMALOG 50/50 MIX) (50-50) 100 UNIT/ML SUSP injection Inject 20-30 Units into the skin 3 (three) times daily. Takes 30 units in the morning 30 at lunch and 20 at night    . isosorbide  mononitrate (IMDUR) 60 MG 24 hr tablet Take 1.5 tablets (90 mg total) by mouth at bedtime. 135 tablet 3  . omeprazole (PRILOSEC) 20 MG capsule Take 20 mg by mouth daily as needed (heartburn or acid reflux).     . ONE TOUCH ULTRA TEST test strip     . ONETOUCH DELICA LANCETS 99991111 MISC     . ranolazine (RANEXA) 500 MG 12 hr tablet Take 1 tablet (500 mg total) by mouth 2 (two) times daily. 30 tablet 3  . sacubitril-valsartan (ENTRESTO) 49-51 MG Take 1 tablet by mouth 2 (two) times daily.    Marland Kitchen venlafaxine (EFFEXOR-XR) 150 MG 24 hr capsule Take 150 mg by mouth at bedtime.     Marland Kitchen zolpidem (AMBIEN CR) 12.5 MG CR tablet Take 12.5 mg by mouth at bedtime as needed. For sleep    . nitroGLYCERIN (NITROSTAT) 0.4 MG SL tablet Place 1 tablet (0.4 mg total) under the tongue every 5 (five) minutes x 3 doses as needed. For chest pain. (Patient not taking: Reported on 03/17/2015) 25 tablet 3   No current facility-administered medications for this encounter.   BP 112/56 mmHg  Pulse 92  Wt 213 lb (96.616 kg)  SpO2 94%  General: NAD Neck: JVP 7 cm, no thyromegaly or nodule noted Lungs: CTAB, normal effort CV: Nondisplaced PMI.  Heart regular S1/S2, no S3/S4, no murmur.  Trace- 1+ ankle edema bilaterally.  No carotid bruit.  Normal pedal pulses.  Abdomen: Soft, NT, ND, no HSM.  Skin: Intact without lesions or rashes.  Neurologic: Alert and oriented x 3.  Psych: Normal affect. Extremities: No clubbing or cyanosis.   Assessment/Plan: 1. Chronic systolic CHF: NYHA class II symptoms, stable.  EF 25-30% on last echo with moderate RV systolic dysfunction. He has a Medtronic CRT-D device.  Normal filling pressures and preserved cardiac index on RHC in 11/15. Volume status looks ok today by Optivol and by exam.  Improved compared to prior appt with increased Lasix.  - Continue current eplerenone and Entresto.  - Continue Lasix 40 mg bid.  Check BMET today  - Continue Coreg 12.5 mg bid with angina recently.   2. CAD:  s/p CABG and redo. He had been having some angina leading up to November. 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 Medical management. Minimal chest pain recently, ranolazine seems to help. - Continue ASA 81, Plavix, statin.  - Continue Imdur and Coreg as above. - Continue ranolazine 500 mg bid.  3. CKD: BMET today. 4. Hyperlipidemia: Good lipids 11/16.  5. Gout: Will give colchicine for gout flare. 0.6 mg once a day while having flare.  He is on allopurinol at baseline.    Follow up 3 months.    Satira Mccallum Tillery PA-C 03/17/2015   Patient seen with PA, agree with the above note.  Volume status  looks better compared to last appointment by Optivol and by exam.  Some lightheadedness with standing, I will not increase his meds today. BMET today.   Gout flare: Add colchicine.    Will followup in 3 months.   [name] 03/17/2015

## 2015-03-17 NOTE — Patient Instructions (Addendum)
CHANGE Inspra to 25mg , one tab every evening START Colchicine 0.6 mg , one tab daily as needed for gout flare  Labs today  Your physician recommends that you schedule a follow-up appointment in: 3 months  Do the following things EVERYDAY: 1) Weigh yourself in the morning before breakfast. Write it down and keep it in a log. 2) Take your medicines as prescribed 3) Eat low salt foods-Limit salt (sodium) to 2000 mg per day.  4) Stay as active as you can everyday 5) Limit all fluids for the day to less than 2 liters 6)

## 2015-03-17 NOTE — Progress Notes (Signed)
Advanced Heart Failure Medication Review by a Pharmacist  Does the patient  feel that his/her medications are working for him/her?  yes  Has the patient been experiencing any side effects to the medications prescribed?  no  Does the patient measure his/her own blood pressure or blood glucose at home?  yes   Does the patient have any problems obtaining medications due to transportation or finances?   no  Understanding of regimen: good Understanding of indications: good Potential of compliance: good Patient understands to avoid NSAIDs. Patient understands to avoid decongestants.  Issues to address at subsequent visits: None   Pharmacist comments:  [name] is a pleasant 74 yo M presenting without a medication list but able to verbalize most of his medications including dosages to me. He reports good compliance with his medications and did not have any specific medication-related questions or concerns for me at this time.   Ruta Hinds. Velva Harman, PharmD, BCPS, CPP Clinical Pharmacist Pager: [phone] Phone: [phone] 03/17/2015 9:42 AM      Time with patient: 6 minutes Preparation and documentation time: 2 minutes Total time: 8 minutes

## 2015-04-08 ENCOUNTER — Encounter: Payer: Self-pay | Admitting: Cardiology

## 2015-04-13 ENCOUNTER — Ambulatory Visit (INDEPENDENT_AMBULATORY_CARE_PROVIDER_SITE_OTHER): Payer: Managed Care, Other (non HMO) | Admitting: *Deleted

## 2015-04-13 DIAGNOSIS — Z95810 Presence of automatic (implantable) cardiac defibrillator: Secondary | ICD-10-CM | POA: Diagnosis not present

## 2015-04-13 DIAGNOSIS — I5022 Chronic systolic (congestive) heart failure: Secondary | ICD-10-CM

## 2015-04-13 DIAGNOSIS — I255 Ischemic cardiomyopathy: Secondary | ICD-10-CM

## 2015-04-13 NOTE — Progress Notes (Signed)
Remote ICD transmission.   

## 2015-04-14 ENCOUNTER — Telehealth: Payer: Self-pay

## 2015-04-14 LAB — CUP PACEART REMOTE DEVICE CHECK
Battery Voltage: 3.02 V
Brady Statistic AP VS Percent: 0.01 %
Brady Statistic AS VS Percent: 0.45 %
Brady Statistic RV Percent Paced: 99.54 %
Date Time Interrogation Session: 20161227072609
HIGH POWER IMPEDANCE MEASURED VALUE: 456 Ohm
HIGH POWER IMPEDANCE MEASURED VALUE: 57 Ohm
HighPow Impedance: 43 Ohm
Implantable Lead Implant Date: 20111003
Implantable Lead Implant Date: 20130509
Implantable Lead Location: 753859
Implantable Lead Model: 6947
Lead Channel Impedance Value: 399 Ohm
Lead Channel Impedance Value: 418 Ohm
Lead Channel Impedance Value: 570 Ohm
Lead Channel Pacing Threshold Amplitude: 0.625 V
Lead Channel Pacing Threshold Pulse Width: 0.4 ms
Lead Channel Pacing Threshold Pulse Width: 0.4 ms
Lead Channel Pacing Threshold Pulse Width: 0.4 ms
Lead Channel Sensing Intrinsic Amplitude: 1.75 mV
Lead Channel Sensing Intrinsic Amplitude: 31.625 mV
Lead Channel Setting Pacing Amplitude: 2 V
Lead Channel Setting Pacing Pulse Width: 0.4 ms
Lead Channel Setting Pacing Pulse Width: 0.4 ms
MDC IDC LEAD IMPLANT DT: 20111003
MDC IDC LEAD LOCATION: 753858
MDC IDC LEAD LOCATION: 753860
MDC IDC LEAD MODEL: 4196
MDC IDC MSMT LEADCHNL LV IMPEDANCE VALUE: 665 Ohm
MDC IDC MSMT LEADCHNL LV PACING THRESHOLD AMPLITUDE: 0.875 V
MDC IDC MSMT LEADCHNL RA IMPEDANCE VALUE: 456 Ohm
MDC IDC MSMT LEADCHNL RA PACING THRESHOLD AMPLITUDE: 1.875 V
MDC IDC MSMT LEADCHNL RA SENSING INTR AMPL: 1.75 mV
MDC IDC MSMT LEADCHNL RV SENSING INTR AMPL: 31.625 mV
MDC IDC SET LEADCHNL RA PACING AMPLITUDE: 2.75 V
MDC IDC SET LEADCHNL RV PACING AMPLITUDE: 2.5 V
MDC IDC SET LEADCHNL RV SENSING SENSITIVITY: 0.3 mV
MDC IDC STAT BRADY AP VP PERCENT: 0.23 %
MDC IDC STAT BRADY AS VP PERCENT: 99.31 %
MDC IDC STAT BRADY RA PERCENT PACED: 0.24 %

## 2015-04-14 NOTE — Telephone Encounter (Signed)
Received 1st ICM remote transmission since enrollment.  Attempted call.  Left message for return call.

## 2015-04-15 ENCOUNTER — Encounter: Payer: Self-pay | Admitting: Cardiology

## 2015-04-15 NOTE — Telephone Encounter (Signed)
Spoke with patient.

## 2015-04-15 NOTE — Progress Notes (Signed)
EPIC Encounter for ICM Monitoring  Patient Name: [name] is a 74 y.o. male Date: 04/15/2015 Primary Care Physican: Mayra Neer, MD Primary Cardiologist: Aundra Dubin Electrophysiologist: Allred Dry Weight: 204 lbs       In the past month, have you:  1. Gained more than 2 pounds in a day or more than 5 pounds in a week? Yes, weight increased to 208 lbs during 04/08/2015 to 04/11/2015 but has resolved.   2. Had changes in your medications (with verification of current medications)? no  3. Had more shortness of breath than is usual for you? no  4. Limited your activity because of shortness of breath? no  5. Not been able to sleep because of shortness of breath? no  6. Had increased swelling in your feet or ankles? no  7. Had symptoms of dehydration (dizziness, dry mouth, increased thirst, decreased urine output) no  8. Had changes in sodium restriction? no  9. Been compliant with medication? Yes   ICM trend: 1 year view 04/13/2015   ICM Trend:  3 month view   Follow-up plan: ICM clinic phone appointment on 05/17/2015. 1st ICM encounter.  Optivol thoracic impedance below baseline 04/08/2015 to 04/13/2015 suggesting fluid retention.  Impedance starting to trend upward on 04/13/2015.  He reported feeling fatigued, slight weight gain and ankle swelling during the 04/08/2015 to 04/11/2015 but is feeling better today.  He reported symptoms have resolved and thinks the holiday foods may contributed to the fluid retention.  Education given to limit sodium intake to 2000 mg daily and fluid intake 64 oz daily.  No changes today.    Copy of note sent to patient's primary care physician, primary cardiologist, and device following physician.  [name], RN, CCM 04/15/2015 9:39 AM

## 2015-04-18 HISTORY — PX: OTHER SURGICAL HISTORY: SHX169

## 2015-04-20 ENCOUNTER — Encounter: Payer: Self-pay | Admitting: Cardiology

## 2015-04-28 ENCOUNTER — Telehealth (HOSPITAL_COMMUNITY): Payer: Self-pay

## 2015-04-28 ENCOUNTER — Other Ambulatory Visit (HOSPITAL_COMMUNITY): Payer: Self-pay

## 2015-04-28 MED ORDER — FUROSEMIDE 40 MG PO TABS: 40.0000 mg | ORAL_TABLET | Freq: Every day | ORAL | Status: DC

## 2015-05-05 DIAGNOSIS — T148 Other injury of unspecified body region: Secondary | ICD-10-CM | POA: Diagnosis not present

## 2015-05-05 DIAGNOSIS — S8991XA Unspecified injury of right lower leg, initial encounter: Secondary | ICD-10-CM | POA: Diagnosis not present

## 2015-05-17 ENCOUNTER — Telehealth: Payer: Self-pay | Admitting: Cardiology

## 2015-05-17 ENCOUNTER — Ambulatory Visit (INDEPENDENT_AMBULATORY_CARE_PROVIDER_SITE_OTHER): Payer: Managed Care, Other (non HMO)

## 2015-05-17 ENCOUNTER — Telehealth: Payer: Self-pay

## 2015-05-17 DIAGNOSIS — I5022 Chronic systolic (congestive) heart failure: Secondary | ICD-10-CM

## 2015-05-17 DIAGNOSIS — Z95810 Presence of automatic (implantable) cardiac defibrillator: Secondary | ICD-10-CM | POA: Diagnosis not present

## 2015-05-17 NOTE — Telephone Encounter (Signed)
Spoke with pt and reminded pt of remote transmission that is due today. Pt verbalized understanding.   

## 2015-05-17 NOTE — Telephone Encounter (Signed)
Remote ICM transmission received.  Attempted patient call and left message for return call.   

## 2015-05-18 NOTE — Telephone Encounter (Signed)
Spoke with patient.

## 2015-05-18 NOTE — Progress Notes (Signed)
EPIC Encounter for ICM Monitoring  Patient Name: [name] is a 75 y.o. male Date: 05/18/2015 Primary Care Physican: Mayra Neer, MD Primary Cardiologist: Aundra Dubin Electrophysiologist: Allred Dry Weight: 209 lbs  Bi-V Pacing 98.9%       In the past month, have you:  1. Gained more than 2 pounds in a day or more than 5 pounds in a week? No, weight gain of 5 lbs in a months time  2. Had changes in your medications (with verification of current medications)? no  3. Had more shortness of breath than is usual for you? no  4. Limited your activity because of shortness of breath? no  5. Not been able to sleep because of shortness of breath? no  6. Had increased swelling in your feet or ankles? No, swelling in belly  7. Had symptoms of dehydration (dizziness, dry mouth, increased thirst, decreased urine output) no  8. Had changes in sodium restriction? no  9. Been compliant with medication? Yes   ICM trend: 3 month view for 05/17/2015   ICM trend: 1 year view for 05/17/2015   Follow-up plan: ICM clinic phone appointment on 05/25/2015.  Optivol Thoracic impedance decreased suggesting fluid with fluid index > threshold since 04/11/2015.   Activity level 0.8 hr/day.   He reported SOB when doing activities and some swelling in belly. He reported weight gain of 5 lbs in last month.   He reported last week he increased Furosemide 40 mg to bid x 3 days as approved by physician and returned to the previous dosage of Furosemide 40 mg daily.        Encouraged him to call if fluid symptoms get worse.                Labs (3/16): K 4, creatinine 1.38       Labs (7/16): K 4, creatinine 1.39, BNP 211 Labs (03/03/15): K 4.1, creatinine 1.47, BNP 227 Labs (04/15/15): K 4.6, creatinine 1.12  Recommended he increase Furosemide 40 mg one tablet twice a day x 2 days and resume the previous prescribed dosage of 40 mg one tablet daily.  He verbalized understanding.   Advised would send to Dr Aundra Dubin  and Dr Rayann Heman for review and will call him if any further recommendations.  Next appointment is with Dr Aundra Dubin on 06/14/2015.   Will repeat remote transmission on 05/25/2015.    Copy of note sent to patient's primary care physician, primary cardiologist, and device following physician.  [name], RN, CCM 05/18/2015 11:19 AM

## 2015-05-19 NOTE — Progress Notes (Signed)
I would recommend that he increase Lasix to 40 mg bid x 1 week, then 40 qam/20 qpm after that.

## 2015-05-20 MED ORDER — FUROSEMIDE 40 MG PO TABS: ORAL_TABLET | ORAL | Status: DC

## 2015-05-20 NOTE — Progress Notes (Signed)
Call to patient and notified of Dr Claris Gladden orders.  Advised to increase Lasix 40 mg bid x 1 week which he started taking on 05/18/2015 until 05/25/2015.  After the 1 week, advised to take 40 mg every am and 20 mg every pm.  He has visit with Dr Aundra Dubin on 06/14/2015 and will repeat remote ICM transmission on 05/25/2015.   He repeated the instructions back and verbalized understanding.

## 2015-05-24 ENCOUNTER — Other Ambulatory Visit: Payer: Self-pay | Admitting: *Deleted

## 2015-05-24 DIAGNOSIS — I5022 Chronic systolic (congestive) heart failure: Secondary | ICD-10-CM

## 2015-05-24 MED ORDER — FUROSEMIDE 40 MG PO TABS
ORAL_TABLET | ORAL | Status: DC
Start: 2015-05-24 — End: 2015-06-14

## 2015-05-24 NOTE — Telephone Encounter (Signed)
error 

## 2015-05-25 ENCOUNTER — Ambulatory Visit (INDEPENDENT_AMBULATORY_CARE_PROVIDER_SITE_OTHER): Payer: Managed Care, Other (non HMO)

## 2015-05-25 DIAGNOSIS — I5022 Chronic systolic (congestive) heart failure: Secondary | ICD-10-CM

## 2015-05-25 DIAGNOSIS — Z95810 Presence of automatic (implantable) cardiac defibrillator: Secondary | ICD-10-CM

## 2015-05-25 NOTE — Progress Notes (Signed)
EPIC Encounter for ICM Monitoring  Patient Name: [name] is a 75 y.o. male Date: 05/25/2015 Primary Care Physican: Mayra Neer, MD Primary Cardiologist: Aundra Dubin Electrophysiologist: Allred Dry Weight: 208 lbs   Bi-V Pacing 98.4%      In the past month, have you:  1. Gained more than 2 pounds in a day or more than 5 pounds in a week? no  2. Had changes in your medications (with verification of current medications)? no  3. Had more shortness of breath than is usual for you? no  4. Limited your activity because of shortness of breath? no  5. Not been able to sleep because of shortness of breath? no  6. Had increased swelling in your feet or ankles? no  7. Had symptoms of dehydration (dizziness, dry mouth, increased thirst, decreased urine output) no  8. Had changes in sodium restriction? no  9. Been compliant with medication? Yes   ICM trend: 3 month view for 05/25/2015   ICM trend: 1 year view for 05/25/2015   Follow-up plan: ICM clinic phone appointment 07/15/2015 and appointment with Dr Aundra Dubin 06/14/2015.  Optivol thoracic impedance at reference line which improved after taking increased Furosemide dosage x 1 week.  He has resumed prescribed dosage.  He denied any fluid retention symptoms and encouraged to call if he experiences any symptoms.   No changes today.    Copy of note sent to patient's primary care physician, primary cardiologist, and device following physician.  [name], RN, CCM 05/25/2015 11:47 AM

## 2015-06-14 ENCOUNTER — Encounter (HOSPITAL_COMMUNITY): Payer: Self-pay

## 2015-06-14 ENCOUNTER — Ambulatory Visit (HOSPITAL_COMMUNITY)
Admission: RE | Admit: 2015-06-14 | Discharge: 2015-06-14 | Disposition: A | Discharge status: Home / Self Care | Payer: Managed Care, Other (non HMO) | Source: Ambulatory Visit | Attending: Cardiology | Admitting: Cardiology

## 2015-06-14 VITALS — BP 108/60 | HR 89 | Wt 212.5 lb

## 2015-06-14 DIAGNOSIS — F329 Major depressive disorder, single episode, unspecified: Secondary | ICD-10-CM | POA: Diagnosis not present

## 2015-06-14 DIAGNOSIS — M109 Gout, unspecified: Secondary | ICD-10-CM | POA: Insufficient documentation

## 2015-06-14 DIAGNOSIS — I2510 Atherosclerotic heart disease of native coronary artery without angina pectoris: Secondary | ICD-10-CM | POA: Diagnosis not present

## 2015-06-14 DIAGNOSIS — Z7902 Long term (current) use of antithrombotics/antiplatelets: Secondary | ICD-10-CM | POA: Insufficient documentation

## 2015-06-14 DIAGNOSIS — R0789 Other chest pain: Secondary | ICD-10-CM | POA: Insufficient documentation

## 2015-06-14 DIAGNOSIS — I252 Old myocardial infarction: Secondary | ICD-10-CM | POA: Insufficient documentation

## 2015-06-14 DIAGNOSIS — I5022 Chronic systolic (congestive) heart failure: Secondary | ICD-10-CM | POA: Insufficient documentation

## 2015-06-14 DIAGNOSIS — E1122 Type 2 diabetes mellitus with diabetic chronic kidney disease: Secondary | ICD-10-CM | POA: Diagnosis not present

## 2015-06-14 DIAGNOSIS — Z951 Presence of aortocoronary bypass graft: Secondary | ICD-10-CM | POA: Diagnosis not present

## 2015-06-14 DIAGNOSIS — N183 Chronic kidney disease, stage 3 unspecified: Secondary | ICD-10-CM | POA: Insufficient documentation

## 2015-06-14 DIAGNOSIS — Z79899 Other long term (current) drug therapy: Secondary | ICD-10-CM | POA: Insufficient documentation

## 2015-06-14 DIAGNOSIS — R410 Disorientation, unspecified: Secondary | ICD-10-CM | POA: Diagnosis not present

## 2015-06-14 DIAGNOSIS — R413 Other amnesia: Secondary | ICD-10-CM | POA: Insufficient documentation

## 2015-06-14 DIAGNOSIS — N189 Chronic kidney disease, unspecified: Secondary | ICD-10-CM | POA: Diagnosis not present

## 2015-06-14 DIAGNOSIS — I255 Ischemic cardiomyopathy: Secondary | ICD-10-CM | POA: Diagnosis not present

## 2015-06-14 DIAGNOSIS — E785 Hyperlipidemia, unspecified: Secondary | ICD-10-CM | POA: Diagnosis not present

## 2015-06-14 DIAGNOSIS — Z87891 Personal history of nicotine dependence: Secondary | ICD-10-CM | POA: Insufficient documentation

## 2015-06-14 DIAGNOSIS — Z794 Long term (current) use of insulin: Secondary | ICD-10-CM | POA: Diagnosis not present

## 2015-06-14 DIAGNOSIS — Z7982 Long term (current) use of aspirin: Secondary | ICD-10-CM | POA: Insufficient documentation

## 2015-06-14 DIAGNOSIS — Z95810 Presence of automatic (implantable) cardiac defibrillator: Secondary | ICD-10-CM | POA: Diagnosis not present

## 2015-06-14 DIAGNOSIS — R109 Unspecified abdominal pain: Secondary | ICD-10-CM | POA: Diagnosis not present

## 2015-06-14 DIAGNOSIS — Z8249 Family history of ischemic heart disease and other diseases of the circulatory system: Secondary | ICD-10-CM | POA: Insufficient documentation

## 2015-06-14 LAB — BASIC METABOLIC PANEL
ANION GAP: 12 (ref 5–15)
BUN: 39 mg/dL — AB (ref 6–20)
CALCIUM: 8.5 mg/dL — AB (ref 8.9–10.3)
CO2: 22 mmol/L (ref 22–32)
Chloride: 103 mmol/L (ref 101–111)
Creatinine, Ser: 1.93 mg/dL — ABNORMAL HIGH (ref 0.61–1.24)
GFR calc Af Amer: 38 mL/min — ABNORMAL LOW (ref >60)
GFR calc non Af Amer: 33 mL/min — ABNORMAL LOW (ref >60)
GLUCOSE: 215 mg/dL — AB (ref 65–99)
POTASSIUM: 3.8 mmol/L (ref 3.5–5.1)
Sodium: 137 mmol/L (ref 135–145)

## 2015-06-14 LAB — BRAIN NATRIURETIC PEPTIDE: B Natriuretic Peptide: 85.6 pg/mL (ref 0.0–100.0)

## 2015-06-14 MED ORDER — PANTOPRAZOLE SODIUM 40 MG PO TBEC: 40.0000 mg | DELAYED_RELEASE_TABLET | Freq: Every day | ORAL | Status: DC

## 2015-06-14 NOTE — Progress Notes (Signed)
Advanced Heart Failure Clinic Note   Patient ID: [name], male   DOB: 29-Mar-1941, 75 y.o.   MRN: [id number] PCP: Dr. Lynnda Child Primary Cardiologist: Dr. Radford Pax Primary HF: Dr Aundra Dubin  75 yo with history of CAD s/p CABG and redo CABG as well as ischemic cardiomyopathy with CRT-D device presents for CHF clinic evaluation.  He had a Cardiolite in the 4/15 showing lateral wall ischemia.  LHC at that time showed all his vein grafts from both CABG surgeries occluded.  The LIMA-LAD was patent and there was a 90% distal LM stenosis.  The lateral ischemia likely corresponded to LCx territory downstream from the LM stenosis.  PCI of the distal LM was thought to be high risk and characteristics of the lesion were not favorable for PCI.  Last echo showed EF 25-30% with moderate RV systolic dysfunction.  He had RHC in 11/15 with normal filling pressures and relatively preserved cardiac index (2.55).    He reports today for regular follow up. Weight is stable.  He has episodes of lower chest/abdominal pain associated with diarrhea and "stomach growling."  This has been going on for 5-6 months . No exertional chest pain.  He can walk on flat ground without dyspnea but does have some trouble with balance.  He says that he "leans left" when walking.  Wife states that he is more forgetful and occasionally is confused.  This has been going on for 3-4 months.    Optivol checked today: Fluid index < threshold. Thoracic impedance stable.  Labs (9/15): LDL particle number 816, LDL 57, LFTs normal Labs (10/15): K 4.4, creatinine 1.4 Labs (11/15): K 4.3, creatinine 1.36 Labs (12/15): K 4.8, creatinine 1.34 Labs (3/16): K 4, creatinine 1.38, LDL 46, HDl 35 Labs (7/16): K 4, creatinine 1.39, BNP 211 Labs (03/03/15): K 4.1, creatinine 1.47, BNP 227, LDL 80, HDL 32 Labs (12/16): K 4.6, creatinine 1.12, LDL 68, HDL 34  PMH: 1. Gout 2. Hyperlipidemia 3. CAD: CABG 1997 and redo 11/12.  LHC (4/15) with totally occluded  LAD, totally occluded RCA, 80% distal LM, 50% mLCx, 2 SVG-RCA grafts totally occluded, 2 SVG-OM grafts totally occluded, patent LIMA-LAD.  Cardiolite prior to 4/15 cath showed lateral wall ischemia (LCx territory).  PCI to distal LM would be a high risk procedure.  Cardiolite (8/16) with EF 20%, severe scar in RCA and probably LCx territory, minimal peri-infarct ischemia.  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 Medical management.  4. Ischemic Cardiomyopathy: Medtronic CRT-D device.  Echo (6/15) with EF 25-30%, moderate LV dilation, inferior and inferolateral akinesis, moderately decreased RV systolic function, mild MR.  RHC (11/15) with mean RA 5, PA 23/6, mean PCWP 9, CI 2.55.  5. H/o cholecystectomy 6. OA 7. Depression 8. Type II diabetes 9. GERD 10. CKD  SH: Married, prior smoker (many years ago), lives in [place].    FH: CAD  ROS: All systems reviewed and negative except as per HPI.   Current Outpatient Prescriptions  Medication Sig Dispense Refill  . allopurinol (ZYLOPRIM) 100 MG tablet Take 100 mg by mouth 2 (two) times daily.     Marland Kitchen aspirin EC 81 MG tablet Take 81 mg by mouth daily.    Marland Kitchen atorvastatin (LIPITOR) 40 MG tablet Take 40 mg by mouth daily at 6 PM.     . buPROPion (WELLBUTRIN XL) 150 MG 24 hr tablet Take 150 mg by mouth daily.    . carvedilol (COREG) 12.5 MG tablet Take 1  tablet (12.5 mg total) by mouth 2 (two) times daily. 180 tablet 3  . clopidogrel (PLAVIX) 75 MG tablet Take 1 tablet (75 mg total) by mouth daily. 90 tablet 3  . eplerenone (INSPRA) 25 MG tablet Take 1 tablet (25 mg total) by mouth every evening. 30 tablet 6  . furosemide (LASIX) 40 MG tablet Take 40 mg by mouth 2 (two) times daily.    . insulin lispro protamine-lispro (HUMALOG 50/50 MIX) (50-50) 100 UNIT/ML SUSP injection Inject 20-30 Units into the skin 3 (three) times daily. Takes 30 units in the morning 30 at lunch and 20 at night    . isosorbide mononitrate (IMDUR)  60 MG 24 hr tablet Take 1.5 tablets (90 mg total) by mouth at bedtime. 135 tablet 3  . omeprazole (PRILOSEC) 20 MG capsule Take 20 mg by mouth daily as needed (heartburn or acid reflux).     . ONE TOUCH ULTRA TEST test strip     . ONETOUCH DELICA LANCETS [id number] MISC     . ranolazine (RANEXA) 500 MG 12 hr tablet Take 1 tablet (500 mg total) by mouth 2 (two) times daily. 30 tablet 3  . sacubitril-valsartan (ENTRESTO) 49-51 MG Take 1 tablet by mouth 2 (two) times daily.    Marland Kitchen venlafaxine (EFFEXOR-XR) 150 MG 24 hr capsule Take 150 mg by mouth at bedtime.     Marland Kitchen zolpidem (AMBIEN CR) 12.5 MG CR tablet Take 12.5 mg by mouth at bedtime as needed. For sleep    . colchicine 0.6 MG tablet Take 1 tablet (0.6 mg total) by mouth daily as needed (for gout flare). (Patient not taking: Reported on 06/14/2015) 30 tablet 6  . nitroGLYCERIN (NITROSTAT) 0.4 MG SL tablet Place 1 tablet (0.4 mg total) under the tongue every 5 (five) minutes x 3 doses as needed. For chest pain. (Patient not taking: Reported on 03/17/2015) 25 tablet 3  . pantoprazole (PROTONIX) 40 MG tablet Take 1 tablet (40 mg total) by mouth daily. 30 tablet 11   No current facility-administered medications for this encounter.   BP 108/60 mmHg  Pulse 89  Wt 212 lb 8 oz (96.389 kg)  SpO2 96%  General: NAD Neck: JVP 7 cm, no thyromegaly or nodule noted Lungs: CTAB, normal effort CV: Nondisplaced PMI.  Heart regular S1/S2, no S3/S4, no murmur. No edema.  No carotid bruit.  Normal pedal pulses.  Abdomen: Soft, NT, ND, no HSM.  Skin: Intact without lesions or rashes.  Neurologic: Alert and oriented x 3.  Psych: Normal affect. Extremities: No clubbing or cyanosis.   Assessment/Plan: 1. Chronic systolic CHF: NYHA class II symptoms, stable.  EF 25-30% on last echo with moderate RV systolic dysfunction. He has a Medtronic CRT-D device.  Normal filling pressures and preserved cardiac index on RHC in 11/15. Volume status looks ok today by Optivol and by exam.    - Continue current eplerenone and Entresto.  - Continue Lasix 40 mg bid.  Check BMET/BNP today  - Continue Coreg 12.5 mg bid with angina recently.   - Echo to assess LV function.  2. CAD: s/p CABG and redo. 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 Medical management. Ranolazine and Imdur have helped with exertional chest pain. He has some atypical chest pain/abdominal pain that may be GI-related.  - Continue ASA 81, Plavix, statin.  - Continue Imdur and Coreg as above. - Continue ranolazine 500 mg bid.  - Add Protonix 40 mg daily for ?GERD.  3. CKD:  BMET today. 4. Hyperlipidemia: Good lipids 11/16.  5. Gout: No gout pain. 6. Neuro: Confusion and memory difficulty, poor balance and leans left walking.   - I will get head CT to assess for CVA and will refer to neurology for evaluation of possible dementia.    Follow up 3 months.    [name]   06/14/2015

## 2015-06-14 NOTE — Patient Instructions (Signed)
Will schedule you for a CT head to rule out stroke at [facility].  Will refer you to [facility]. Address: [address], [place], [place] [address]  Phone: [phone]  Will schedule you for an echocardiogram at [facility]. Address: [address] ([place]), [place], [place] [address]  Phone: [phone]  Start protonix 40mg  once daily.  Routine lab work today. Will notify you of abnormal results, otherwise no news is good news!  Do the following things EVERYDAY: 1) Weigh yourself in the morning before breakfast. Write it down and keep it in a log. 2) Take your medicines as prescribed 3) Eat low salt foods-Limit salt (sodium) to 2000 mg per day.  4) Stay as active as you can everyday 5) Limit all fluids for the day to less than 2 liters

## 2015-06-15 ENCOUNTER — Other Ambulatory Visit (HOSPITAL_COMMUNITY): Payer: Self-pay

## 2015-06-15 ENCOUNTER — Encounter (HOSPITAL_COMMUNITY): Payer: Medicare Other

## 2015-06-15 MED ORDER — PANTOPRAZOLE SODIUM 40 MG PO TBEC: 40.0000 mg | DELAYED_RELEASE_TABLET | Freq: Every day | ORAL | Status: DC

## 2015-06-17 ENCOUNTER — Other Ambulatory Visit (HOSPITAL_COMMUNITY): Payer: Self-pay | Admitting: *Deleted

## 2015-06-17 DIAGNOSIS — R410 Disorientation, unspecified: Secondary | ICD-10-CM

## 2015-06-18 ENCOUNTER — Ambulatory Visit (HOSPITAL_COMMUNITY): Payer: Managed Care, Other (non HMO)

## 2015-06-18 ENCOUNTER — Ambulatory Visit (HOSPITAL_COMMUNITY)
Admission: RE | Admit: 2015-06-18 | Discharge: 2015-06-18 | Disposition: A | Discharge status: Home / Self Care | Payer: Managed Care, Other (non HMO) | Source: Ambulatory Visit | Attending: Cardiology | Admitting: Cardiology

## 2015-06-18 DIAGNOSIS — R410 Disorientation, unspecified: Secondary | ICD-10-CM | POA: Insufficient documentation

## 2015-06-18 DIAGNOSIS — R42 Dizziness and giddiness: Secondary | ICD-10-CM | POA: Diagnosis not present

## 2015-06-22 ENCOUNTER — Other Ambulatory Visit: Payer: Self-pay | Admitting: Cardiology

## 2015-06-23 ENCOUNTER — Other Ambulatory Visit (HOSPITAL_COMMUNITY): Payer: Managed Care, Other (non HMO)

## 2015-06-24 ENCOUNTER — Telehealth (HOSPITAL_COMMUNITY): Payer: Self-pay | Admitting: Pharmacist

## 2015-06-24 NOTE — Telephone Encounter (Signed)
Pantoprazole (and all other PPIs) are not covered and not eligible for PA under patient's [employer].   Ruta Hinds. Velva Harman, PharmD, BCPS, CPP Clinical Pharmacist Pager: [phone] Phone: [phone] 06/24/2015 3:43 PM

## 2015-06-30 ENCOUNTER — Ambulatory Visit (HOSPITAL_BASED_OUTPATIENT_CLINIC_OR_DEPARTMENT_OTHER): Payer: Managed Care, Other (non HMO)

## 2015-06-30 ENCOUNTER — Other Ambulatory Visit: Payer: Self-pay

## 2015-06-30 ENCOUNTER — Ambulatory Visit (HOSPITAL_COMMUNITY)
Admission: RE | Admit: 2015-06-30 | Discharge: 2015-06-30 | Disposition: A | Discharge status: Home / Self Care | Payer: Managed Care, Other (non HMO) | Source: Ambulatory Visit | Attending: Cardiology | Admitting: Cardiology

## 2015-06-30 DIAGNOSIS — I77810 Thoracic aortic ectasia: Secondary | ICD-10-CM | POA: Insufficient documentation

## 2015-06-30 DIAGNOSIS — I340 Nonrheumatic mitral (valve) insufficiency: Secondary | ICD-10-CM | POA: Diagnosis not present

## 2015-06-30 DIAGNOSIS — I5022 Chronic systolic (congestive) heart failure: Secondary | ICD-10-CM | POA: Diagnosis not present

## 2015-06-30 LAB — BASIC METABOLIC PANEL
ANION GAP: 10 (ref 5–15)
BUN: 21 mg/dL — ABNORMAL HIGH (ref 6–20)
CALCIUM: 9 mg/dL (ref 8.9–10.3)
CO2: 23 mmol/L (ref 22–32)
Chloride: 106 mmol/L (ref 101–111)
Creatinine, Ser: 1.37 mg/dL — ABNORMAL HIGH (ref 0.61–1.24)
GFR, EST AFRICAN AMERICAN: 58 mL/min — AB (ref >60)
GFR, EST NON AFRICAN AMERICAN: 50 mL/min — AB (ref >60)
GLUCOSE: 173 mg/dL — AB (ref 65–99)
POTASSIUM: 4 mmol/L (ref 3.5–5.1)
SODIUM: 139 mmol/L (ref 135–145)

## 2015-07-02 ENCOUNTER — Telehealth (HOSPITAL_COMMUNITY): Payer: Self-pay

## 2015-07-02 NOTE — Telephone Encounter (Signed)
LVMOM with echo results for patient on confidential VM. Advised to call back to CHF triage line with any questions/concerns.  Renee Pain

## 2015-07-06 ENCOUNTER — Encounter: Payer: Self-pay | Admitting: Cardiology

## 2015-07-15 ENCOUNTER — Ambulatory Visit (INDEPENDENT_AMBULATORY_CARE_PROVIDER_SITE_OTHER): Payer: Managed Care, Other (non HMO)

## 2015-07-15 ENCOUNTER — Ambulatory Visit (INDEPENDENT_AMBULATORY_CARE_PROVIDER_SITE_OTHER): Payer: Managed Care, Other (non HMO) | Admitting: *Deleted

## 2015-07-15 DIAGNOSIS — I255 Ischemic cardiomyopathy: Secondary | ICD-10-CM

## 2015-07-15 DIAGNOSIS — Z95810 Presence of automatic (implantable) cardiac defibrillator: Secondary | ICD-10-CM

## 2015-07-15 DIAGNOSIS — I5022 Chronic systolic (congestive) heart failure: Secondary | ICD-10-CM | POA: Diagnosis not present

## 2015-07-15 NOTE — Progress Notes (Signed)
Remote ICD transmission.   

## 2015-07-16 NOTE — Progress Notes (Signed)
EPIC Encounter for ICM Monitoring  Patient Name: [name] is a 75 y.o. male Date: 07/16/2015 Primary Care Physican: Mayra Neer, MD Primary Cardiologist: Aundra Dubin Electrophysiologist: Allred Dry Weight: 211 lb   Bi-V Pacing 98.6%      In the past month, have you:  1. Gained more than 2 pounds in a day or more than 5 pounds in a week? no  2. Had changes in your medications (with verification of current medications)? no  3. Had more shortness of breath than is usual for you? no  4. Limited your activity because of shortness of breath? no  5. Not been able to sleep because of shortness of breath? no  6. Had increased swelling in your feet or ankles? no  7. Had symptoms of dehydration (dizziness, dry mouth, increased thirst, decreased urine output) no  8. Had changes in sodium restriction? no  9. Been compliant with medication? Yes   ICM trend: 3 month view for 07/15/2015   ICM trend: 1 year view for 07/15/2015   Follow-up plan: ICM clinic phone appointment on 08/16/2015.  Thoracic impedance below reference line from 07/01/2015 to 07/13/2015 suggesting fluid accumulation.  Patient denied fluid symptoms.   Education given to limit sodium intake to < 2000 mg and fluid intake to 64 oz daily.  Encouraged to call for any fluid symptoms.  No changes today.    [name], RN, CCM 07/16/2015 8:57 AM

## 2015-07-28 ENCOUNTER — Encounter: Payer: Self-pay | Admitting: Cardiology

## 2015-07-28 DIAGNOSIS — I5022 Chronic systolic (congestive) heart failure: Secondary | ICD-10-CM | POA: Diagnosis not present

## 2015-07-28 DIAGNOSIS — E1121 Type 2 diabetes mellitus with diabetic nephropathy: Secondary | ICD-10-CM | POA: Diagnosis not present

## 2015-07-28 DIAGNOSIS — I209 Angina pectoris, unspecified: Secondary | ICD-10-CM | POA: Diagnosis not present

## 2015-07-28 DIAGNOSIS — I255 Ischemic cardiomyopathy: Secondary | ICD-10-CM | POA: Diagnosis not present

## 2015-07-28 DIAGNOSIS — I25119 Atherosclerotic heart disease of native coronary artery with unspecified angina pectoris: Secondary | ICD-10-CM | POA: Diagnosis not present

## 2015-07-28 DIAGNOSIS — I130 Hypertensive heart and chronic kidney disease with heart failure and stage 1 through stage 4 chronic kidney disease, or unspecified chronic kidney disease: Secondary | ICD-10-CM | POA: Diagnosis not present

## 2015-07-28 DIAGNOSIS — N183 Chronic kidney disease, stage 3 (moderate): Secondary | ICD-10-CM | POA: Diagnosis not present

## 2015-07-28 DIAGNOSIS — E1165 Type 2 diabetes mellitus with hyperglycemia: Secondary | ICD-10-CM | POA: Diagnosis not present

## 2015-07-28 DIAGNOSIS — E669 Obesity, unspecified: Secondary | ICD-10-CM | POA: Diagnosis not present

## 2015-07-28 DIAGNOSIS — F322 Major depressive disorder, single episode, severe without psychotic features: Secondary | ICD-10-CM | POA: Diagnosis not present

## 2015-07-28 DIAGNOSIS — E7800 Pure hypercholesterolemia, unspecified: Secondary | ICD-10-CM | POA: Diagnosis not present

## 2015-07-28 DIAGNOSIS — Z0000 Encounter for general adult medical examination without abnormal findings: Secondary | ICD-10-CM | POA: Diagnosis not present

## 2015-08-01 ENCOUNTER — Encounter (HOSPITAL_COMMUNITY): Payer: Self-pay | Admitting: Emergency Medicine

## 2015-08-01 ENCOUNTER — Inpatient Hospital Stay (HOSPITAL_COMMUNITY)
Admission: EM | Admit: 2015-08-01 | Discharge: 2015-08-08 | DRG: 215 | Disposition: A | Discharge status: Home / Self Care | Payer: Managed Care, Other (non HMO) | Attending: Internal Medicine | Admitting: Internal Medicine

## 2015-08-01 ENCOUNTER — Emergency Department (HOSPITAL_COMMUNITY): Payer: Managed Care, Other (non HMO)

## 2015-08-01 DIAGNOSIS — Z955 Presence of coronary angioplasty implant and graft: Secondary | ICD-10-CM

## 2015-08-01 DIAGNOSIS — Z9841 Cataract extraction status, right eye: Secondary | ICD-10-CM

## 2015-08-01 DIAGNOSIS — N179 Acute kidney failure, unspecified: Secondary | ICD-10-CM | POA: Diagnosis not present

## 2015-08-01 DIAGNOSIS — I25110 Atherosclerotic heart disease of native coronary artery with unstable angina pectoris: Secondary | ICD-10-CM | POA: Diagnosis present

## 2015-08-01 DIAGNOSIS — N183 Chronic kidney disease, stage 3 unspecified: Secondary | ICD-10-CM | POA: Diagnosis present

## 2015-08-01 DIAGNOSIS — I255 Ischemic cardiomyopathy: Secondary | ICD-10-CM | POA: Diagnosis present

## 2015-08-01 DIAGNOSIS — I480 Paroxysmal atrial fibrillation: Secondary | ICD-10-CM | POA: Diagnosis present

## 2015-08-01 DIAGNOSIS — I252 Old myocardial infarction: Secondary | ICD-10-CM

## 2015-08-01 DIAGNOSIS — I2510 Atherosclerotic heart disease of native coronary artery without angina pectoris: Secondary | ICD-10-CM | POA: Diagnosis not present

## 2015-08-01 DIAGNOSIS — E1122 Type 2 diabetes mellitus with diabetic chronic kidney disease: Secondary | ICD-10-CM | POA: Diagnosis present

## 2015-08-01 DIAGNOSIS — Z95810 Presence of automatic (implantable) cardiac defibrillator: Secondary | ICD-10-CM

## 2015-08-01 DIAGNOSIS — E785 Hyperlipidemia, unspecified: Secondary | ICD-10-CM | POA: Diagnosis present

## 2015-08-01 DIAGNOSIS — Z87891 Personal history of nicotine dependence: Secondary | ICD-10-CM

## 2015-08-01 DIAGNOSIS — I5022 Chronic systolic (congestive) heart failure: Secondary | ICD-10-CM | POA: Diagnosis not present

## 2015-08-01 DIAGNOSIS — I25119 Atherosclerotic heart disease of native coronary artery with unspecified angina pectoris: Secondary | ICD-10-CM | POA: Diagnosis not present

## 2015-08-01 DIAGNOSIS — K219 Gastro-esophageal reflux disease without esophagitis: Secondary | ICD-10-CM | POA: Diagnosis present

## 2015-08-01 DIAGNOSIS — I214 Non-ST elevation (NSTEMI) myocardial infarction: Principal | ICD-10-CM | POA: Diagnosis present

## 2015-08-01 DIAGNOSIS — M1990 Unspecified osteoarthritis, unspecified site: Secondary | ICD-10-CM | POA: Diagnosis present

## 2015-08-01 DIAGNOSIS — I5023 Acute on chronic systolic (congestive) heart failure: Secondary | ICD-10-CM | POA: Diagnosis not present

## 2015-08-01 DIAGNOSIS — Z794 Long term (current) use of insulin: Secondary | ICD-10-CM

## 2015-08-01 DIAGNOSIS — Z951 Presence of aortocoronary bypass graft: Secondary | ICD-10-CM

## 2015-08-01 DIAGNOSIS — I130 Hypertensive heart and chronic kidney disease with heart failure and stage 1 through stage 4 chronic kidney disease, or unspecified chronic kidney disease: Secondary | ICD-10-CM | POA: Diagnosis present

## 2015-08-01 DIAGNOSIS — I200 Unstable angina: Secondary | ICD-10-CM | POA: Insufficient documentation

## 2015-08-01 DIAGNOSIS — Z961 Presence of intraocular lens: Secondary | ICD-10-CM | POA: Diagnosis present

## 2015-08-01 DIAGNOSIS — M25461 Effusion, right knee: Secondary | ICD-10-CM | POA: Diagnosis present

## 2015-08-01 DIAGNOSIS — I10 Essential (primary) hypertension: Secondary | ICD-10-CM | POA: Diagnosis present

## 2015-08-01 DIAGNOSIS — N644 Mastodynia: Secondary | ICD-10-CM | POA: Diagnosis not present

## 2015-08-01 DIAGNOSIS — E119 Type 2 diabetes mellitus without complications: Secondary | ICD-10-CM

## 2015-08-01 DIAGNOSIS — R0789 Other chest pain: Secondary | ICD-10-CM | POA: Diagnosis not present

## 2015-08-01 DIAGNOSIS — Z9842 Cataract extraction status, left eye: Secondary | ICD-10-CM | POA: Diagnosis not present

## 2015-08-01 DIAGNOSIS — Z7982 Long term (current) use of aspirin: Secondary | ICD-10-CM

## 2015-08-01 DIAGNOSIS — Z7902 Long term (current) use of antithrombotics/antiplatelets: Secondary | ICD-10-CM

## 2015-08-01 DIAGNOSIS — R079 Chest pain, unspecified: Secondary | ICD-10-CM | POA: Diagnosis present

## 2015-08-01 DIAGNOSIS — R0682 Tachypnea, not elsewhere classified: Secondary | ICD-10-CM | POA: Diagnosis not present

## 2015-08-01 LAB — CBC
HCT: 38.7 % — ABNORMAL LOW (ref 39.0–52.0)
Hemoglobin: 12.8 g/dL — ABNORMAL LOW (ref 13.0–17.0)
MCH: 30.4 pg (ref 26.0–34.0)
MCHC: 33.1 g/dL (ref 30.0–36.0)
MCV: 91.9 fL (ref 78.0–100.0)
PLATELETS: 236 10*3/uL (ref 150–400)
RBC: 4.21 MIL/uL — ABNORMAL LOW (ref 4.22–5.81)
RDW: 14.6 % (ref 11.5–15.5)
WBC: 10.7 10*3/uL — ABNORMAL HIGH (ref 4.0–10.5)

## 2015-08-01 LAB — COMPREHENSIVE METABOLIC PANEL
ALT: 20 U/L (ref 17–63)
ANION GAP: 13 (ref 5–15)
AST: 23 U/L (ref 15–41)
Albumin: 3.4 g/dL — ABNORMAL LOW (ref 3.5–5.0)
Alkaline Phosphatase: 90 U/L (ref 38–126)
BUN: 26 mg/dL — ABNORMAL HIGH (ref 6–20)
CHLORIDE: 98 mmol/L — AB (ref 101–111)
CO2: 22 mmol/L (ref 22–32)
CREATININE: 1.43 mg/dL — AB (ref 0.61–1.24)
Calcium: 9.2 mg/dL (ref 8.9–10.3)
GFR, EST AFRICAN AMERICAN: 55 mL/min — AB (ref >60)
GFR, EST NON AFRICAN AMERICAN: 47 mL/min — AB (ref >60)
Glucose, Bld: 389 mg/dL — ABNORMAL HIGH (ref 65–99)
POTASSIUM: 4.1 mmol/L (ref 3.5–5.1)
SODIUM: 133 mmol/L — AB (ref 135–145)
Total Bilirubin: 0.6 mg/dL (ref 0.3–1.2)
Total Protein: 6.7 g/dL (ref 6.5–8.1)

## 2015-08-01 LAB — PROTIME-INR
INR: 1.25 (ref 0.00–1.49)
Prothrombin Time: 15.8 seconds — ABNORMAL HIGH (ref 11.6–15.2)

## 2015-08-01 LAB — GLUCOSE, CAPILLARY: Glucose-Capillary: 127 mg/dL — ABNORMAL HIGH (ref 65–99)

## 2015-08-01 LAB — I-STAT TROPONIN, ED: TROPONIN I, POC: 0.06 ng/mL (ref 0.00–0.08)

## 2015-08-01 LAB — TROPONIN I
Troponin I: 0.18 ng/mL — ABNORMAL HIGH (ref <0.031)
Troponin I: 4.64 ng/mL (ref <0.031)

## 2015-08-01 LAB — APTT: aPTT: 28 seconds (ref 24–37)

## 2015-08-01 MED ORDER — CARVEDILOL 12.5 MG PO TABS
12.5000 mg | ORAL_TABLET | Freq: Two times a day (BID) | ORAL | Status: DC
  Administered 2015-08-01 – 2015-08-03 (×4): 12.5 mg via ORAL
  Filled 2015-08-01 (×4): qty 1

## 2015-08-01 MED ORDER — ASPIRIN EC 81 MG PO TBEC
81.0000 mg | DELAYED_RELEASE_TABLET | Freq: Every day | ORAL | Status: DC
  Administered 2015-08-02: 81 mg via ORAL
  Filled 2015-08-01: qty 1

## 2015-08-01 MED ORDER — ASPIRIN 81 MG PO CHEW: 324.0000 mg | CHEWABLE_TABLET | ORAL | Status: AC

## 2015-08-01 MED ORDER — HEPARIN BOLUS VIA INFUSION
4000.0000 [IU] | Freq: Once | INTRAVENOUS | Status: DC
  Filled 2015-08-01: qty 4000

## 2015-08-01 MED ORDER — ALLOPURINOL 100 MG PO TABS
100.0000 mg | ORAL_TABLET | Freq: Two times a day (BID) | ORAL | Status: DC
  Administered 2015-08-01 – 2015-08-08 (×13): 100 mg via ORAL
  Filled 2015-08-01 (×14): qty 1

## 2015-08-01 MED ORDER — NITROGLYCERIN 0.4 MG SL SUBL
0.4000 mg | SUBLINGUAL_TABLET | SUBLINGUAL | Status: DC | PRN
  Administered 2015-08-01 (×5): 0.4 mg via SUBLINGUAL
  Filled 2015-08-01 (×2): qty 1

## 2015-08-01 MED ORDER — NON FORMULARY: 25.0000 mg | Freq: Every day | Status: DC

## 2015-08-01 MED ORDER — NITROGLYCERIN 0.4 MG SL SUBL: 0.4000 mg | SUBLINGUAL_TABLET | SUBLINGUAL | Status: DC | PRN

## 2015-08-01 MED ORDER — CLOPIDOGREL BISULFATE 75 MG PO TABS
75.0000 mg | ORAL_TABLET | Freq: Every day | ORAL | Status: DC
  Administered 2015-08-01 – 2015-08-06 (×5): 75 mg via ORAL
  Filled 2015-08-01 (×5): qty 1

## 2015-08-01 MED ORDER — INSULIN ASPART 100 UNIT/ML ~~LOC~~ SOLN
0.0000 [IU] | Freq: Three times a day (TID) | SUBCUTANEOUS | Status: DC
  Administered 2015-08-02: 2 [IU] via SUBCUTANEOUS
  Administered 2015-08-02: 5 [IU] via SUBCUTANEOUS
  Administered 2015-08-02: 2 [IU] via SUBCUTANEOUS
  Administered 2015-08-03 (×2): 1 [IU] via SUBCUTANEOUS
  Administered 2015-08-04: 2 [IU] via SUBCUTANEOUS
  Administered 2015-08-04: 1 [IU] via SUBCUTANEOUS
  Administered 2015-08-04 – 2015-08-05 (×3): 2 [IU] via SUBCUTANEOUS
  Administered 2015-08-05 – 2015-08-06 (×2): 1 [IU] via SUBCUTANEOUS
  Administered 2015-08-07 (×3): 3 [IU] via SUBCUTANEOUS
  Administered 2015-08-08: 1 [IU] via SUBCUTANEOUS

## 2015-08-01 MED ORDER — ONDANSETRON HCL 4 MG/2ML IJ SOLN: 4.0000 mg | Freq: Four times a day (QID) | INTRAMUSCULAR | Status: DC | PRN

## 2015-08-01 MED ORDER — VENLAFAXINE HCL ER 150 MG PO CP24
150.0000 mg | ORAL_CAPSULE | Freq: Every day | ORAL | Status: DC
  Administered 2015-08-01 – 2015-08-07 (×6): 150 mg via ORAL
  Filled 2015-08-01 (×7): qty 1

## 2015-08-01 MED ORDER — PANTOPRAZOLE SODIUM 40 MG PO TBEC: 40.0000 mg | DELAYED_RELEASE_TABLET | Freq: Every day | ORAL | Status: DC

## 2015-08-01 MED ORDER — RANOLAZINE ER 500 MG PO TB12
1000.0000 mg | ORAL_TABLET | Freq: Two times a day (BID) | ORAL | Status: DC
  Administered 2015-08-01 – 2015-08-06 (×10): 1000 mg via ORAL
  Filled 2015-08-01 (×11): qty 2

## 2015-08-01 MED ORDER — BUPROPION HCL ER (XL) 150 MG PO TB24: 150.0000 mg | ORAL_TABLET | Freq: Every day | ORAL | Status: DC

## 2015-08-01 MED ORDER — ASPIRIN 300 MG RE SUPP: 300.0000 mg | RECTAL | Status: AC

## 2015-08-01 MED ORDER — ATORVASTATIN CALCIUM 40 MG PO TABS: 40.0000 mg | ORAL_TABLET | Freq: Every day | ORAL | Status: DC

## 2015-08-01 MED ORDER — ACETAMINOPHEN 325 MG PO TABS
650.0000 mg | ORAL_TABLET | ORAL | Status: DC | PRN
  Administered 2015-08-02 – 2015-08-04 (×3): 650 mg via ORAL
  Filled 2015-08-01 (×3): qty 2

## 2015-08-01 MED ORDER — HEPARIN SODIUM (PORCINE) 5000 UNIT/ML IJ SOLN
4000.0000 [IU] | INTRAMUSCULAR | Status: AC
  Administered 2015-08-01: 4000 [IU] via INTRAVENOUS
  Filled 2015-08-01: qty 1

## 2015-08-01 MED ORDER — MORPHINE SULFATE (PF) 2 MG/ML IV SOLN
2.0000 mg | INTRAVENOUS | Status: DC | PRN
  Administered 2015-08-02 – 2015-08-06 (×7): 2 mg via INTRAVENOUS
  Filled 2015-08-01 (×7): qty 1

## 2015-08-01 MED ORDER — PANTOPRAZOLE SODIUM 40 MG PO TBEC
40.0000 mg | DELAYED_RELEASE_TABLET | Freq: Every day | ORAL | Status: DC
  Administered 2015-08-02 – 2015-08-08 (×7): 40 mg via ORAL
  Filled 2015-08-01 (×7): qty 1

## 2015-08-01 MED ORDER — EPLERENONE 25 MG PO TABS
25.0000 mg | ORAL_TABLET | Freq: Every day | ORAL | Status: DC
  Administered 2015-08-01 – 2015-08-03 (×3): 25 mg via ORAL
  Filled 2015-08-01 (×3): qty 1

## 2015-08-01 MED ORDER — ALPRAZOLAM 0.25 MG PO TABS
0.2500 mg | ORAL_TABLET | Freq: Two times a day (BID) | ORAL | Status: DC | PRN
  Administered 2015-08-02 – 2015-08-06 (×4): 0.25 mg via ORAL
  Filled 2015-08-01 (×5): qty 1

## 2015-08-01 MED ORDER — ISOSORBIDE MONONITRATE ER 30 MG PO TB24
90.0000 mg | ORAL_TABLET | Freq: Every day | ORAL | Status: DC
  Administered 2015-08-01: 90 mg via ORAL
  Filled 2015-08-01: qty 3

## 2015-08-01 MED ORDER — SACUBITRIL-VALSARTAN 49-51 MG PO TABS
1.0000 | ORAL_TABLET | Freq: Two times a day (BID) | ORAL | Status: DC
  Administered 2015-08-01 – 2015-08-02 (×2): 1 via ORAL
  Filled 2015-08-01 (×2): qty 1

## 2015-08-01 MED ORDER — NITROGLYCERIN IN D5W 200-5 MCG/ML-% IV SOLN
2.0000 ug/min | INTRAVENOUS | Status: DC
  Administered 2015-08-01: 2 ug/min via INTRAVENOUS
  Filled 2015-08-01: qty 250

## 2015-08-01 MED ORDER — ASPIRIN EC 81 MG PO TBEC: 81.0000 mg | DELAYED_RELEASE_TABLET | Freq: Every day | ORAL | Status: DC

## 2015-08-01 MED ORDER — HEPARIN (PORCINE) IN NACL 100-0.45 UNIT/ML-% IJ SOLN
1300.0000 [IU]/h | INTRAMUSCULAR | Status: DC
  Administered 2015-08-01: 1100 [IU]/h via INTRAVENOUS
  Administered 2015-08-03: 1300 [IU]/h via INTRAVENOUS
  Filled 2015-08-01 (×3): qty 250

## 2015-08-01 MED ORDER — SODIUM CHLORIDE 0.9 % IV SOLN
INTRAVENOUS | Status: DC
  Administered 2015-08-01: 13:00:00 via INTRAVENOUS

## 2015-08-01 NOTE — ED Notes (Signed)
Cancelled code STEMI, per Dr. Gwenlyn Found @ 12:13pm.

## 2015-08-01 NOTE — Progress Notes (Signed)
ANTICOAGULATION CONSULT NOTE - Initial Consult  Pharmacy Consult for heparin  Indication: chest pain/ACS  Allergies  Allergen Reactions  . Codeine Nausea And Vomiting  . Latex Rash    Specifies adhesive     Patient Measurements: Height: 5\' 6"  (167.6 cm) Weight: 209 lb (94.802 kg) IBW/kg (Calculated) : 63.8 Heparin Dosing Weight: 84 kg  Vital Signs: Temp: 97.7 F (36.5 C) (04/16 1203) Temp Source: Oral (04/16 1203) BP: 118/76 mmHg (04/16 1430) Pulse Rate: 95 (04/16 1430)  Labs:  Recent Labs  08/01/15 1216 08/01/15 1404  HGB 12.8*  --   HCT 38.7*  --   PLT 236  --   APTT 28  --   LABPROT 15.8*  --   INR 1.25  --   CREATININE 1.43*  --   TROPONINI  --  0.18*    Estimated Creatinine Clearance: 50.3 mL/min (by C-G formula based on Cr of 1.43).   Medical History: Past Medical History  Diagnosis Date  . Ischemic cardiomyopathy     s/p ICD Implantation by Dr Leonia Reeves  . Chronic systolic dysfunction of left ventricle     EF 30%  . Hyperlipemia   . DJD (degenerative joint disease)   . Gout   . HTN (hypertension) 02/14/2011  . Depression   . Angina   . Myocardial infarction ([place]) 1997  . Cardiac defibrillator in situ   . DM (diabetes mellitus) ([place]) 02/14/2011  . CHF (congestive heart failure) ([place])     EF 30% by cath 05/2011  . ICD (implantable cardiac defibrillator) in place   . GERD (gastroesophageal reflux disease)   . Heart attack ([place])   . Complication of anesthesia     ONCE WITH BACK SURGERY DIFFICULT TO WAKE UP  . CAD (coronary artery disease)     s/p CABG 1997, PCI (BMS) of SVG to RCA 3/09, redo CABG 02/2011 with SVG to PDA, SVG to Lcx, s/p cath 2.2013 with ocluded SVT to left circ and patent SVG to RCA.  Cath 07/2013 Recent cath showed ischemic cardiomyopathy with LVEF less than 20%, with progressive LV dysfunction related to bypass graft failure, occlusion of the saphenous vein grafts placed in 2012 to the right coronary and to the circum  .  Paroxysmal atrial fibrillation ([place]) 10/22/14    single episode of AF x 3 hours 48 minutes recorded on ICD, chads2vasc score is at least 5     Assessment: 75 yo M presents on 4/16 with chest pain. Has known severe CAD. Pharmacy consulted to start heparin gtt. On plavix and aspirin at home but no anticoag. Hgb 12.8, plts wnl. No s/s of bleed.  Goal of Therapy:  Heparin level 0.3-0.7 units/ml Monitor platelets by anticoagulation protocol: Yes   Plan:  Give 4,000 heparin BOLUS Start heparin gtt at 1,100 units/hr Monitor daily HL, CBC, s/s of bleed  Nyjah Schwake J 08/01/2015,2:44 PM

## 2015-08-01 NOTE — ED Provider Notes (Signed)
CSN: [id number]     Arrival date & time 08/01/15  1155 History   First MD Initiated Contact with Patient 08/01/15 1209     Chief Complaint  Patient presents with  . Chest Pain     Patient is a 75 y.o. male presenting with chest pain. The history is provided by the patient. No language interpreter was used.  Chest Pain  MAEJOR ANDREONI is a 75 y.o. male who presents to the Emergency Department complaining of chest pain.  Reports left-sided chest pain that started 2-3 hours prior to ED arrival. Pain is described as a hard type constant left-sided chest pain. He has associated nausea with emesis 1. He denies any fevers, shortness of breath, abdominal pain, leg swelling or pain. He has a history of coronary artery disease status post CABG and recurrent stenting.  Past Medical History  Diagnosis Date  . Ischemic cardiomyopathy     s/p ICD Implantation by Dr Leonia Reeves  . Chronic systolic dysfunction of left ventricle     EF 30%  . Hyperlipemia   . DJD (degenerative joint disease)   . Gout   . HTN (hypertension) 02/14/2011  . Depression   . Angina   . Myocardial infarction ([place]) 1997  . Cardiac defibrillator in situ   . DM (diabetes mellitus) ([place]) 02/14/2011  . CHF (congestive heart failure) ([place])     EF 30% by cath 05/2011  . ICD (implantable cardiac defibrillator) in place   . GERD (gastroesophageal reflux disease)   . Heart attack ([place])   . Complication of anesthesia     ONCE WITH BACK SURGERY DIFFICULT TO WAKE UP  . CAD (coronary artery disease)     s/p CABG 1997, PCI (BMS) of SVG to RCA 3/09, redo CABG 02/2011 with SVG to PDA, SVG to Lcx, s/p cath 2.2013 with ocluded SVT to left circ and patent SVG to RCA.  Cath 07/2013 Recent cath showed ischemic cardiomyopathy with LVEF less than 20%, with progressive LV dysfunction related to bypass graft failure, occlusion of the saphenous vein grafts placed in 2012 to the right coronary and to the circum  . Paroxysmal atrial fibrillation ([facility])  10/22/14    single episode of AF x 3 hours 48 minutes recorded on ICD, chads2vasc score is at least 5    Past Surgical History  Procedure Laterality Date  . Cardiac defibrillator placement  08/2004    initial placement, upgraded to [place] ICD by Dr Lovena Le 08/24/11 (MDT)  . Knee arthrotomy  ~ 1978    RIGHT KNEE CARTILAGE REMOVED  . Coronary angioplasty with stent placement  06/2007    BMS to SVG to RCA  . Coronary angioplasty  02/2011  . Cataract extraction w/ intraocular lens  implant, bilateral  2012  . Cardiac defibrillator placement  2009  . Lumbar disc surgery  2003  . Back surgery  2003  . Coronary artery bypass graft  01/1996    CABG x 5 LIMA to LAD SVG to diag1,2,svg to om,svg to RCA  . Coronary artery bypass graft  03/06/2011    CABG X2; Procedure: REDO CORONARY ARTERY BYPASS GRAFTING (CABG);  Surgeon: Grace Isaac, MD;  Location: [facility];  Service: Open Heart Surgery;  Laterality: N/A;  times two grafts using right saphenous vein harvested endoscopically.  . Cholecystectomy  01/05/2012    Procedure: LAPAROSCOPIC CHOLECYSTECTOMY WITH INTRAOPERATIVE CHOLANGIOGRAM;  Surgeon: Joyice Faster. Cornett, MD;  Location: [facility];  Service: General;  Laterality: N/A;  laparoscopic cholecysectoym with  intraoperative cholangiogram  . Colonoscopy with propofol N/A 11/18/2013    Procedure: COLONOSCOPY WITH PROPOFOL;  Surgeon: Garlan Fair, MD;  Location: [facility] ENDOSCOPY;  Service: Endoscopy;  Laterality: N/A;  . Left heart catheterization with coronary angiogram N/A 06/06/2011    Procedure: LEFT HEART CATHETERIZATION WITH CORONARY ANGIOGRAM;  Surgeon: Sueanne Margarita, MD;  Location: [facility] CATH LAB;  Service: Cardiovascular;  Laterality: N/A;  . Venogram N/A 06/08/2011    Procedure: VENOGRAM;  Surgeon: Thompson Grayer, MD;  Location: [facility] CATH LAB;  Service: Cardiovascular;  Laterality: N/A;  . Bi-ventricular implantable cardioverter defibrillator upgrade N/A 08/24/2011    Procedure: BI-VENTRICULAR IMPLANTABLE CARDIOVERTER  DEFIBRILLATOR UPGRADE;  Surgeon: Evans Lance, MD;  Location: [facility] CATH LAB;  Service: Cardiovascular;  Laterality: N/A;  . Left heart catheterization with coronary/graft angiogram N/A 08/08/2013    Procedure: LEFT HEART CATHETERIZATION WITH Beatrix Fetters;  Surgeon: Sinclair Grooms, MD;  Location: [facility] CATH LAB;  Service: Cardiovascular;  Laterality: N/A;  . Right heart catheterization N/A 02/17/2014    Procedure: RIGHT HEART CATH;  Surgeon: Larey Dresser, MD;  Location: [facility] CATH LAB;  Service: Cardiovascular;  Laterality: N/A;   Family History  Problem Relation Age of Onset  . Coronary artery disease    . Heart failure Mother   . Heart failure Father   . Heart attack Mother   . Heart attack Father   . Sudden Cardiac Death Brother     in his 31's  . Heart attack Brother   . Cancer Brother    Social History  Substance Use Topics  . Smoking status: Former Smoker -- 0.50 packs/day for 15 years    Types: Cigarettes    Quit date: 04/17/1969  . Smokeless tobacco: Former Systems developer    Quit date: 05/05/1971  . Alcohol Use: 0.6 oz/week    1 Cans of beer per week     Comment: occasional    Review of Systems  Cardiovascular: Positive for chest pain.  All other systems reviewed and are negative.     Allergies  Codeine and Latex  Home Medications   Prior to Admission medications   Medication Sig Start Date End Date Taking? Authorizing Provider  allopurinol (ZYLOPRIM) 100 MG tablet Take 100 mg by mouth 2 (two) times daily.    Yes Historical Provider, MD  aspirin EC 81 MG tablet Take 81 mg by mouth daily.   Yes Historical Provider, MD  atorvastatin (LIPITOR) 40 MG tablet Take 40 mg by mouth daily at 6 PM.  01/12/14  Yes Historical Provider, MD  carvedilol (COREG) 6.25 MG tablet Take 6.25 mg by mouth 2 (two) times daily with a meal.   Yes Historical Provider, MD  clopidogrel (PLAVIX) 75 MG tablet Take 1 tablet (75 mg total) by mouth daily. 02/03/14  Yes Thompson Grayer, MD   colchicine 0.6 MG tablet Take 1 tablet (0.6 mg total) by mouth daily as needed (for gout flare). 03/17/15  Yes Larey Dresser, MD  ENTRESTO 49-51 MG TAKE 1 TABLET BY MOUTH TWICE DAILY 06/22/15  Yes Larey Dresser, MD  eplerenone (INSPRA) 25 MG tablet Take 1 tablet (25 mg total) by mouth every evening. 03/17/15  Yes Larey Dresser, MD  furosemide (LASIX) 40 MG tablet Take 40 mg by mouth 2 (two) times daily.   Yes Historical Provider, MD  insulin lispro protamine-lispro (HUMALOG 50/50 MIX) (50-50) 100 UNIT/ML SUSP injection Inject 20-30 Units into the skin 3 (three) times daily. Takes 30 units in  the morning 30 at lunch and 20 at night   Yes Historical Provider, MD  isosorbide mononitrate (IMDUR) 60 MG 24 hr tablet Take 1.5 tablets (90 mg total) by mouth at bedtime. 12/09/14  Yes Larey Dresser, MD  ONE TOUCH ULTRA TEST test strip  09/24/14  Yes Historical Provider, MD  Jonetta Speak LANCETS [id number] [place]  09/24/14  Yes Historical Provider, MD  pantoprazole (PROTONIX) 40 MG tablet Take 1 tablet (40 mg total) by mouth daily. 06/15/15  Yes Jolaine Artist, MD  ranolazine (RANEXA) 1000 MG SR tablet Take 500 mg by mouth 2 (two) times daily.   Yes Historical Provider, MD  venlafaxine (EFFEXOR-XR) 150 MG 24 hr capsule Take 150 mg by mouth at bedtime.    Yes Historical Provider, MD  zolpidem (AMBIEN CR) 12.5 MG CR tablet Take 12.5 mg by mouth at bedtime as needed. For sleep 04/04/11  Yes Historical Provider, MD  nitroGLYCERIN (NITROSTAT) 0.4 MG SL tablet Place 1 tablet (0.4 mg total) under the tongue every 5 (five) minutes x 3 doses as needed. For chest pain. Patient not taking: Reported on 03/17/2015 11/26/14   Shaune Pascal Bensimhon, MD   BP 130/76 mmHg  Pulse 88  Temp(Src) 97.7 F (36.5 C) (Oral)  Resp 26  Ht 5\' 6"  (1.676 m)  Wt 209 lb (94.802 kg)  BMI 33.75 kg/m2  SpO2 94% Physical Exam  Constitutional: He is oriented to person, place, and time. He appears well-developed and well-nourished.  HENT:   Head: Normocephalic and atraumatic.  Cardiovascular: Regular rhythm.   No murmur heard. Tachycardic  Pulmonary/Chest: Effort normal and breath sounds normal. No respiratory distress.  Abdominal: Soft. There is no tenderness. There is no rebound and no guarding.  Musculoskeletal: He exhibits no edema or tenderness.  Neurological: He is alert and oriented to person, place, and time.  Skin: Skin is warm and dry.  Psychiatric: He has a normal mood and affect. His behavior is normal.  Nursing note and vitals reviewed.   ED Course  Procedures (including critical care time) Labs Review Labs Reviewed  CBC - Abnormal; Notable for the following:    WBC 10.7 (*)    RBC 4.21 (*)    Hemoglobin 12.8 (*)    HCT 38.7 (*)    All other components within normal limits  COMPREHENSIVE METABOLIC PANEL - Abnormal; Notable for the following:    Sodium 133 (*)    Chloride 98 (*)    Glucose, Bld 389 (*)    BUN 26 (*)    Creatinine, Ser 1.43 (*)    Albumin 3.4 (*)    GFR calc non Af Amer 47 (*)    GFR calc Af Amer 55 (*)    All other components within normal limits  PROTIME-INR - Abnormal; Notable for the following:    Prothrombin Time 15.8 (*)    All other components within normal limits  TROPONIN I - Abnormal; Notable for the following:    Troponin I 0.18 (*)    All other components within normal limits  APTT  TROPONIN I  TROPONIN I  HEPARIN LEVEL (UNFRACTIONATED)  HEPARIN LEVEL (UNFRACTIONATED)  CBC  I-STAT TROPOININ, ED    Imaging Review Dg Chest Port 1 View  08/01/2015  CLINICAL DATA:  75 year old male with a history of chest pain and STEMI EXAM: PORTABLE CHEST 1 VIEW COMPARISON:  12/24/2011 FINDINGS: Cardiomediastinal silhouette is unchanged, though distorted with the apical lordotic positioning. Cardiac pacing device/ defibrillator on the left chest wall  is unchanged. Surgical changes of median sternotomy and CABG. Low lung volumes with interstitial opacities. No pneumothorax or large  pleural effusion. No large confluent airspace disease. IMPRESSION: Low lung volumes with likely atelectasis and chronic lung changes with no evidence of pneumonia or significant edema. Surgical changes in median sternotomy and CABG. Unchanged left chest wall AICD. Signed, Dulcy Fanny. [name], DO [facility] Electronically Signed   By: Corrie Mckusick D.O.   On: 08/01/2015 13:17   I have personally reviewed and evaluated these images and lab results as part of my medical decision-making.   EKG Interpretation   Date/Time:  Sunday August 01 2015 12:00:25 EDT Ventricular Rate:  107 PR Interval:  97 QRS Duration: 163 QT Interval:  393 QTC Calculation: 524 R Axis:   -117 Text Interpretation:  Sinus tachycardia RBBB and LAFB Inferior infarct,  acute Consider anterolateral infarct Confirmed by Hazle Coca [phone]) on  08/01/2015 5:01:12 PM      MDM   Final diagnoses:  None   Pt here for evaluation of chest pain, has an extensive cardiac hx.  EKG with changes concerning for new ischemia.  Code STEMI activated.  After discussion with the STEMI physician the STEMI was cancelled and Cardiology consulted for further management.  Pt's pain did improve with nitroglycerin but did not completely resolve.     Quintella Reichert, MD 08/01/15 [phone]

## 2015-08-01 NOTE — Consult Note (Signed)
CARDIOLOGY CONSULT NOTE   Patient ID: TAIMUR FEICHT MRN: [id number] DOB/AGE: [birth year] 73 y.o.  Admit date: 08/01/2015  Primary Physician   Mayra Neer, MD Primary Cardiologist: Dr. Aundra Dubin Reason for Consultation: Chest pain   HPI: Mr. Marcus is a 75 year old male with past medical history of CAD s/p CABG and re do CABG in 0000000, chronic systolic congestive heart failure, ischemic cardiomyopathy s/p Medtronic CRT-D device placement, hyperlipidemia, hypertension, diabetes, and PAF.  Last echo was in March 2017, EF was 35% with mild LVH, inferior and inferolateral akinesis, and anterior lateral hypokinesis. He is followed in the CHF clinic and at last visit his weight was stable, he reported exertional chest and abdominal pain with exertion felt to be GI related.   Cardiolite done on 02/25/2015 showed primarily scar from old MI with minimal ischemia. EF was estimated at 20%. His last cath was in April 2015 (report below). SVG to RCA and circumflex are totally occluded.   He presented to the ED via EMS today with chest pain that began around 9 am.  He describes the pain as tightness and heavy, with no radiation. This pain is not like his usual angina, this pain is less severe.  He  He took 3 sublingual nitroglycerin with no relief. Code STEMI was initially called but canceled. Troponin negative x1.    EKG shows sinus tachycardia with RBBB, he is V paced.   Past Medical History  Diagnosis Date  . Ischemic cardiomyopathy     s/p ICD Implantation by Dr Leonia Reeves  . Chronic systolic dysfunction of left ventricle     EF 30%  . Hyperlipemia   . DJD (degenerative joint disease)   . Gout   . HTN (hypertension) 02/14/2011  . Depression   . Angina   . Myocardial infarction ([place]) 1997  . Cardiac defibrillator in situ   . DM (diabetes mellitus) ([place]) 02/14/2011  . CHF (congestive heart failure) ([place])     EF 30% by cath 05/2011  . ICD (implantable cardiac defibrillator) in place   .  GERD (gastroesophageal reflux disease)   . Heart attack ([place])   . Complication of anesthesia     ONCE WITH BACK SURGERY DIFFICULT TO WAKE UP  . CAD (coronary artery disease)     s/p CABG 1997, PCI (BMS) of SVG to RCA 3/09, redo CABG 02/2011 with SVG to PDA, SVG to Lcx, s/p cath 2.2013 with ocluded SVT to left circ and patent SVG to RCA.  Cath 07/2013 Recent cath showed ischemic cardiomyopathy with LVEF less than 20%, with progressive LV dysfunction related to bypass graft failure, occlusion of the saphenous vein grafts placed in 2012 to the right coronary and to the circum  . Paroxysmal atrial fibrillation (HCC) 10/22/14    single episode of AF x 3 hours 48 minutes recorded on ICD, chads2vasc score is at least 5      Past Surgical History  Procedure Laterality Date  . Cardiac defibrillator placement  08/2004    initial placement, upgraded to [place] ICD by Dr Lovena Le 08/24/11 (MDT)  . Knee arthrotomy  ~ 1978    RIGHT KNEE CARTILAGE REMOVED  . Coronary angioplasty with stent placement  06/2007    BMS to SVG to RCA  . Coronary angioplasty  02/2011  . Cataract extraction w/ intraocular lens  implant, bilateral  2012  . Cardiac defibrillator placement  2009  . Lumbar disc surgery  2003  . Back surgery  2003  . Coronary  artery bypass graft  01/1996    CABG x 5 LIMA to LAD SVG to diag1,2,svg to om,svg to RCA  . Coronary artery bypass graft  03/06/2011    CABG X2; Procedure: REDO CORONARY ARTERY BYPASS GRAFTING (CABG);  Surgeon: Grace Isaac, MD;  Location: [facility];  Service: Open Heart Surgery;  Laterality: N/A;  times two grafts using right saphenous vein harvested endoscopically.  . Cholecystectomy  01/05/2012    Procedure: LAPAROSCOPIC CHOLECYSTECTOMY WITH INTRAOPERATIVE CHOLANGIOGRAM;  Surgeon: Joyice Faster. Cornett, MD;  Location: [facility];  Service: General;  Laterality: N/A;  laparoscopic cholecysectoym with intraoperative cholangiogram  . Colonoscopy with propofol N/A 11/18/2013    Procedure:  COLONOSCOPY WITH PROPOFOL;  Surgeon: Garlan Fair, MD;  Location: [facility];  Service: Endoscopy;  Laterality: N/A;  . Left heart catheterization with coronary angiogram N/A 06/06/2011    Procedure: LEFT HEART CATHETERIZATION WITH CORONARY ANGIOGRAM;  Surgeon: Sueanne Margarita, MD;  Location: [facility] CATH LAB;  Service: Cardiovascular;  Laterality: N/A;  . Venogram N/A 06/08/2011    Procedure: VENOGRAM;  Surgeon: Thompson Grayer, MD;  Location: [facility] CATH LAB;  Service: Cardiovascular;  Laterality: N/A;  . Bi-ventricular implantable cardioverter defibrillator upgrade N/A 08/24/2011    Procedure: BI-VENTRICULAR IMPLANTABLE CARDIOVERTER DEFIBRILLATOR UPGRADE;  Surgeon: Evans Lance, MD;  Location: [facility] CATH LAB;  Service: Cardiovascular;  Laterality: N/A;  . Left heart catheterization with coronary/graft angiogram N/A 08/08/2013    Procedure: LEFT HEART CATHETERIZATION WITH Beatrix Fetters;  Surgeon: Sinclair Grooms, MD;  Location: [facility] CATH LAB;  Service: Cardiovascular;  Laterality: N/A;  . Right heart catheterization N/A 02/17/2014    Procedure: RIGHT HEART CATH;  Surgeon: Larey Dresser, MD;  Location: [facility] CATH LAB;  Service: Cardiovascular;  Laterality: N/A;    Allergies  Allergen Reactions  . Codeine Nausea And Vomiting  . Latex Rash    Specifies adhesive     I have reviewed the patient's current medications   . sodium chloride 20 mL/hr at 08/01/15 1230   nitroGLYCERIN  Prior to Admission medications   Medication Sig Start Date End Date Taking? Authorizing Provider  allopurinol (ZYLOPRIM) 100 MG tablet Take 100 mg by mouth 2 (two) times daily.     Historical Provider, MD  aspirin EC 81 MG tablet Take 81 mg by mouth daily.    Historical Provider, MD  atorvastatin (LIPITOR) 40 MG tablet Take 40 mg by mouth daily at 6 PM.  01/12/14   Historical Provider, MD  buPROPion (WELLBUTRIN XL) 150 MG 24 hr tablet Take 150 mg by mouth daily.    Historical Provider, MD  carvedilol (COREG) 12.5 MG  tablet Take 1 tablet (12.5 mg total) by mouth 2 (two) times daily. 02/17/15   Larey Dresser, MD  clopidogrel (PLAVIX) 75 MG tablet Take 1 tablet (75 mg total) by mouth daily. 02/03/14   Thompson Grayer, MD  colchicine 0.6 MG tablet Take 1 tablet (0.6 mg total) by mouth daily as needed (for gout flare). Patient not taking: Reported on 06/14/2015 03/17/15   Larey Dresser, MD  ENTRESTO 49-51 MG TAKE 1 TABLET BY MOUTH TWICE DAILY 06/22/15   Larey Dresser, MD  eplerenone (INSPRA) 25 MG tablet Take 1 tablet (25 mg total) by mouth every evening. 03/17/15   Larey Dresser, MD  furosemide (LASIX) 40 MG tablet Take 40 mg by mouth 2 (two) times daily.    Historical Provider, MD  insulin lispro protamine-lispro (HUMALOG 50/50 MIX) (50-50) 100 UNIT/ML SUSP  injection Inject 20-30 Units into the skin 3 (three) times daily. Takes 30 units in the morning 30 at lunch and 20 at night    Historical Provider, MD  isosorbide mononitrate (IMDUR) 60 MG 24 hr tablet Take 1.5 tablets (90 mg total) by mouth at bedtime. 12/09/14   Larey Dresser, MD  nitroGLYCERIN (NITROSTAT) 0.4 MG SL tablet Place 1 tablet (0.4 mg total) under the tongue every 5 (five) minutes x 3 doses as needed. For chest pain. Patient not taking: Reported on 03/17/2015 11/26/14   Jolaine Artist, MD  omeprazole (PRILOSEC) 20 MG capsule Take 20 mg by mouth daily as needed (heartburn or acid reflux).     Historical Provider, MD  ONE TOUCH ULTRA TEST test strip  09/24/14   Historical Provider, MD  Jonetta Speak LANCETS [id number] [place]  09/24/14   Historical Provider, MD  pantoprazole (PROTONIX) 40 MG tablet Take 1 tablet (40 mg total) by mouth daily. 06/15/15   Jolaine Artist, MD  ranolazine (RANEXA) 500 MG 12 hr tablet Take 1 tablet (500 mg total) by mouth 2 (two) times daily. 02/17/15   Larey Dresser, MD  venlafaxine (EFFEXOR-XR) 150 MG 24 hr capsule Take 150 mg by mouth at bedtime.     Historical Provider, MD  zolpidem (AMBIEN CR) 12.5 MG CR tablet Take 12.5  mg by mouth at bedtime as needed. For sleep 04/04/11   Historical Provider, MD     Social History   Social History  . Marital Status: Married    Spouse Name: N/A  . Number of Children: N/A  . Years of Education: N/A   Occupational History  . Not on file.   Social History Main Topics  . Smoking status: Former Smoker -- 0.50 packs/day for 15 years    Types: Cigarettes    Quit date: 04/17/1969  . Smokeless tobacco: Former Systems developer    Quit date: 05/05/1971  . Alcohol Use: 0.6 oz/week    1 Cans of beer per week     Comment: occasional  . Drug Use: No  . Sexual Activity: No   Other Topics Concern  . Not on file   Social History Narrative   Lives in [place], retired Theatre manager for [employer].  He collects antique metal toys    Family Status  Relation Status Death Age  . Mother Deceased 48  . Father Deceased 83  . Brother Deceased 59s    sudden death, presumed cardiac  . Brother Deceased 21    had Cancer and hx MI.   Family History  Problem Relation Age of Onset  . Coronary artery disease    . Heart failure Mother   . Heart failure Father   . Heart attack Mother   . Heart attack Father   . Sudden Cardiac Death Brother     in his 20's  . Heart attack Brother   . Cancer Brother      ROS:  Full 14 point review of systems complete and found to be negative unless listed above.  Physical Exam: Blood pressure 115/82, pulse 104, temperature 97.7 F (36.5 C), temperature source Oral, resp. rate 19, SpO2 96 %.  General: Well developed, well nourished, male in no acute distress Head: Eyes PERRLA, No xanthomas.   Normocephalic and atraumatic, oropharynx without edema or exudate.  Lungs: CTA Heart: HRRR S1 S2, no rub/gallop, No murmur. Pulses are 2+ extrem.   Neck: No carotid bruits. No lymphadenopathy.  Jaw to ear JVD.  Abdomen: Bowel sounds present, abdomen soft and non-tender without masses or hernias noted. Msk:  No spine or cva tenderness. No weakness, no  joint deformities or effusions. Extremities: No clubbing or cyanosis.  +2 edema BLE.  Neuro: Alert and oriented X 3. No focal deficits noted. Psych:  Good affect, responds appropriately Skin: No rashes or lesions noted.  Labs:   Lab Results  Component Value Date   WBC 10.7* 08/01/2015   HGB 12.8* 08/01/2015   HCT 38.7* 08/01/2015   MCV 91.9 08/01/2015   PLT 236 08/01/2015    Recent Labs  08/01/15 1216  INR 1.25    Recent Labs  08/01/15 1213  TROPIPOC 0.06    Echo:  - Left ventricle: The cavity size was mildly dilated. Wall  thickness was increased in a pattern of mild LVH. Inferior and  inferolateral akinesis, anterolateral hypokinesis. The estimated  ejection fraction was 35%. Doppler parameters are consistent with  abnormal left ventricular relaxation (grade 1 diastolic  dysfunction). - Aortic valve: There was no stenosis. - Aorta: Mildly dilated aortic root and ascending aorta. Aortic  root dimension: 37 mm (ED). Ascending aortic diameter: 42 mm (S). - Mitral valve: There was mild regurgitation. - Left atrium: The atrium was mildly dilated. - Right ventricle: The cavity size was normal. Pacer wire or  catheter noted in right ventricle. Systolic function was normal. - Right atrium: The atrium was mildly dilated. - Pulmonary arteries: No complete TR doppler jet so unable to  estimate PA systolic pressure. - Inferior vena cava: The vessel was normal in size. The  respirophasic diameter changes were in the normal range (= 50%),  consistent with normal central venous pressure.  Impressions:  - Mildly dilated LV with mild LV hypertrophy. EF 35% with wall  motion abnormalities as noted above. No significant valvular  abnormalities.  Transthoracic echocardiography. M-mode, complete 2D, spectral Doppler, and color Doppler. Birthdate: Patient birthdate: Feb 07, 1941. Age: Patient is 75 yr old. Sex: Gender: male. BMI: 30.5 kg/m^2. Blood  pressure: 108/60 Patient status: Outpatient. Study date: Study date: 06/30/2015. Study time: 01:15 PM. Location: [facility]  -------------------------------------------------------------------  ------------------------------------------------------------------- Left ventricle: The cavity size was mildly dilated. Wall thickness was increased in a pattern of mild LVH. Inferior and inferolateral akinesis, anterolateral hypokinesis. The estimated ejection fraction was 35%. Doppler parameters are consistent with abnormal left ventricular relaxation (grade 1 diastolic dysfunction).  ECG:   Left heart cath: IMPRESSIONS:  1. Ischemic cardiomyopathy with LVEF less than 20%, with progressive LV dysfunction related to bypass graft failure.  2. Occlusion of the saphenous vein grafts placed in 2012 to the right coronary and to the circumflex  3. Widely patent LIMA to the LAD  4. Totally occluded native right coronary, proximal LAD, and 80% distal left main (unchanged since 2011)   ASSESSMENT AND PLAN:    Active Problems:   Ischemic cardiomyopathy   Hyperlipemia   CAD (coronary artery disease)   HTN (hypertension)   DM (diabetes mellitus) (HCC)   Chronic systolic CHF (congestive heart failure) (HCC)   Paroxysmal atrial fibrillation (HCC)   CKD (chronic kidney disease) stage 3, GFR 30-59 ml/min  1. Chest pain: Resolved currently.  He has known severe CAD.  We will follow troponin, place on heparin gtt.  No benefit in cath as patient has known severe CAD and not a candidate for PCI. Also this may be related to volume overload as patient reports drinking more yesterday including 2 beers and he usually does not drink.  We can increase his Ranexa to help with angina.   2. Ischemic Cardiomyopathy: Followed by Dr. Aundra Dubin.    3. HTN: Blood pressure well controlled on current regimen.    4. Chronic systolic CHF: Last EF [id number].  He is volume overloaded on exam with jaw JVD. We will  diurese with IV Lasix and follow his creatinine closely.    5. CKD stage 3: baseline Cr is 1.3-1.4.    Signed: Arbutus Leas, NP 08/01/2015 1:36 PM Pager [phone]  Co-Sign MD   Patient seen and examined with Jettie Booze, NP-C. We discussed all aspects of the encounter. I agree with the assessment and plan as stated above.   Patient with known severe CAD and CHF presents with chest pressure which is slightly different from previous MI. ECG is paced. Troponin 0.06. On exam has volume overload which may be cause of symptoms. Will diurese. Cover with heparin as we cycle markers. I have reviewed most recent cath films and stress test. He as occluded grafts except for LIMA. LM ~80% into ostial LCX but very calcified and poor PCI target (has been reviewed with interventionalists previously). Can increase Ranexa as needed.   Brodin Gelpi,MD 1:48 PM

## 2015-08-01 NOTE — Progress Notes (Signed)
CRITICAL VALUE ALERT  Critical value received:  Troponin 4.64  Date of notification:  08/01/2015  Time of notification:  2100  Critical value read back:Yes.    Nurse who received alert:  Fara Chute, RN  MD notified (1st page):  Cardiology  Time of first page:  2110  MD notified (2nd page):  Time of second page:  Responding MD:    Time MD responded:

## 2015-08-01 NOTE — ED Notes (Signed)
Code STEMI activated @ 12:05pm.

## 2015-08-01 NOTE — ED Notes (Signed)
Cardiology at bedside.

## 2015-08-01 NOTE — ED Notes (Addendum)
Pt arrived via [facility] with c/o chest pain beginning this morning. Pt has hx of multiple MI's with 7 stents placed. Pt took 4 Aspirins at home and 3 NTG tabs with no relief. Pt was given 1 tab of NTG and pain went from 8/10 to 3/10. Morphine also gave relief. Code STEMI called at arrival.

## 2015-08-02 ENCOUNTER — Inpatient Hospital Stay (HOSPITAL_COMMUNITY): Payer: Managed Care, Other (non HMO)

## 2015-08-02 DIAGNOSIS — I214 Non-ST elevation (NSTEMI) myocardial infarction: Principal | ICD-10-CM

## 2015-08-02 DIAGNOSIS — I2510 Atherosclerotic heart disease of native coronary artery without angina pectoris: Secondary | ICD-10-CM

## 2015-08-02 LAB — BASIC METABOLIC PANEL
ANION GAP: 11 (ref 5–15)
BUN: 19 mg/dL (ref 6–20)
CO2: 24 mmol/L (ref 22–32)
Calcium: 8.7 mg/dL — ABNORMAL LOW (ref 8.9–10.3)
Chloride: 103 mmol/L (ref 101–111)
Creatinine, Ser: 1.18 mg/dL (ref 0.61–1.24)
GFR, EST NON AFRICAN AMERICAN: 60 mL/min — AB (ref >60)
Glucose, Bld: 171 mg/dL — ABNORMAL HIGH (ref 65–99)
Potassium: 4 mmol/L (ref 3.5–5.1)
SODIUM: 138 mmol/L (ref 135–145)

## 2015-08-02 LAB — ECHOCARDIOGRAM COMPLETE
Height: 66 in
Weight: 3294.55 oz

## 2015-08-02 LAB — CBC
HEMATOCRIT: 36.7 % — AB (ref 39.0–52.0)
Hemoglobin: 11.9 g/dL — ABNORMAL LOW (ref 13.0–17.0)
MCH: 29.8 pg (ref 26.0–34.0)
MCHC: 32.4 g/dL (ref 30.0–36.0)
MCV: 92 fL (ref 78.0–100.0)
Platelets: 230 10*3/uL (ref 150–400)
RBC: 3.99 MIL/uL — ABNORMAL LOW (ref 4.22–5.81)
RDW: 14.7 % (ref 11.5–15.5)
WBC: 9.7 10*3/uL (ref 4.0–10.5)

## 2015-08-02 LAB — GLUCOSE, CAPILLARY
GLUCOSE-CAPILLARY: 156 mg/dL — AB (ref 65–99)
GLUCOSE-CAPILLARY: 193 mg/dL — AB (ref 65–99)
Glucose-Capillary: 251 mg/dL — ABNORMAL HIGH (ref 65–99)
Glucose-Capillary: 201 mg/dL — ABNORMAL HIGH (ref 65–99)

## 2015-08-02 LAB — HEPARIN LEVEL (UNFRACTIONATED)
HEPARIN UNFRACTIONATED: 0.45 [IU]/mL (ref 0.30–0.70)
Heparin Unfractionated: 0.15 IU/mL — ABNORMAL LOW (ref 0.30–0.70)
Heparin Unfractionated: 0.35 IU/mL (ref 0.30–0.70)

## 2015-08-02 LAB — TROPONIN I: Troponin I: 6.59 ng/mL (ref <0.031)

## 2015-08-02 LAB — MRSA PCR SCREENING: MRSA by PCR: NEGATIVE

## 2015-08-02 MED ORDER — SODIUM CHLORIDE 0.9% FLUSH: 3.0000 mL | INTRAVENOUS | Status: DC | PRN

## 2015-08-02 MED ORDER — ASPIRIN 81 MG PO CHEW
81.0000 mg | CHEWABLE_TABLET | ORAL | Status: AC
  Administered 2015-08-03: 81 mg via ORAL
  Filled 2015-08-02: qty 1

## 2015-08-02 MED ORDER — SODIUM CHLORIDE 0.9 % IV SOLN
INTRAVENOUS | Status: DC
  Administered 2015-08-02: via INTRAVENOUS

## 2015-08-02 MED ORDER — SODIUM CHLORIDE 0.9 % IV SOLN: 250.0000 mL | INTRAVENOUS | Status: DC | PRN

## 2015-08-02 MED ORDER — ZOLPIDEM TARTRATE 5 MG PO TABS
5.0000 mg | ORAL_TABLET | Freq: Every evening | ORAL | Status: DC | PRN
  Administered 2015-08-02 – 2015-08-07 (×4): 5 mg via ORAL
  Filled 2015-08-02 (×4): qty 1

## 2015-08-02 MED ORDER — ASPIRIN EC 81 MG PO TBEC
81.0000 mg | DELAYED_RELEASE_TABLET | Freq: Every day | ORAL | Status: DC
  Administered 2015-08-04 – 2015-08-06 (×3): 81 mg via ORAL
  Filled 2015-08-02 (×3): qty 1

## 2015-08-02 MED ORDER — SODIUM CHLORIDE 0.9% FLUSH
3.0000 mL | Freq: Two times a day (BID) | INTRAVENOUS | Status: DC
  Administered 2015-08-02 – 2015-08-03 (×2): 3 mL via INTRAVENOUS

## 2015-08-02 MED ORDER — ATORVASTATIN CALCIUM 80 MG PO TABS
80.0000 mg | ORAL_TABLET | Freq: Every day | ORAL | Status: DC
  Administered 2015-08-02 – 2015-08-07 (×5): 80 mg via ORAL
  Filled 2015-08-02 (×6): qty 1

## 2015-08-02 MED ORDER — HEPARIN BOLUS VIA INFUSION
2000.0000 [IU] | Freq: Once | INTRAVENOUS | Status: AC
  Administered 2015-08-02: 2000 [IU] via INTRAVENOUS
  Filled 2015-08-02: qty 2000

## 2015-08-02 MED ORDER — FUROSEMIDE 10 MG/ML IJ SOLN
80.0000 mg | Freq: Two times a day (BID) | INTRAMUSCULAR | Status: DC
  Administered 2015-08-02 (×2): 80 mg via INTRAVENOUS
  Filled 2015-08-02 (×2): qty 8

## 2015-08-02 MED ORDER — SACUBITRIL-VALSARTAN 24-26 MG PO TABS
1.0000 | ORAL_TABLET | Freq: Two times a day (BID) | ORAL | Status: DC
  Administered 2015-08-02 – 2015-08-03 (×2): 1 via ORAL
  Filled 2015-08-02 (×2): qty 1

## 2015-08-02 NOTE — H&P (Signed)
CARDIOLOGY H&P   Patient ID: [name] MRN: [id number] DOB/AGE: Jan 08, 1941 75 y.o.  Admit date: 08/01/2015  Primary Physician   Mayra Neer, MD Primary Cardiologist: Dr. Aundra Dubin Reason for Consultation: Chest pain   HPI: [name] is a 75 year old male with past medical history of CAD s/p CABG and re do CABG in 0000000, chronic systolic congestive heart failure, ischemic cardiomyopathy s/p Medtronic CRT-D device placement, hyperlipidemia, hypertension, diabetes, and PAF.  Last echo was in March 2017, EF was 35% with mild LVH, inferior and inferolateral akinesis, and anterior lateral hypokinesis. He is followed in the CHF clinic and at last visit his weight was stable, he reported exertional chest and abdominal pain with exertion felt to be GI related.   Cardiolite done on 02/25/2015 showed primarily scar from old MI with minimal ischemia. EF was estimated at 20%. His last cath was in April 2015 (report below). SVG to RCA and circumflex are totally occluded.   He presented to the ED via EMS today with chest pain that began around 9 am.  He describes the pain as tightness and heavy, with no radiation. This pain is not like his usual angina, this pain is less severe.  He  He took 3 sublingual nitroglycerin with no relief. Code STEMI was initially called but canceled. Troponin negative x1.    EKG shows sinus tachycardia with RBBB, he is V paced.   Past Medical History  Diagnosis Date  . Ischemic cardiomyopathy     s/p ICD Implantation by Dr Leonia Reeves  . Chronic systolic dysfunction of left ventricle     EF 30%  . Hyperlipemia   . DJD (degenerative joint disease)   . Gout   . HTN (hypertension) 02/14/2011  . Depression   . Angina   . Myocardial infarction ([place]) 1997  . Cardiac defibrillator in situ   . DM (diabetes mellitus) ([place]) 02/14/2011  . CHF (congestive heart failure) ([place])     EF 30% by cath 05/2011  . ICD (implantable cardiac defibrillator) in place   . GERD  (gastroesophageal reflux disease)   . Heart attack ([place])   . Complication of anesthesia     ONCE WITH BACK SURGERY DIFFICULT TO WAKE UP  . CAD (coronary artery disease)     s/p CABG 1997, PCI (BMS) of SVG to RCA 3/09, redo CABG 02/2011 with SVG to PDA, SVG to Lcx, s/p cath 2.2013 with ocluded SVT to left circ and patent SVG to RCA.  Cath 07/2013 Recent cath showed ischemic cardiomyopathy with LVEF less than 20%, with progressive LV dysfunction related to bypass graft failure, occlusion of the saphenous vein grafts placed in 2012 to the right coronary and to the circum  . Paroxysmal atrial fibrillation (HCC) 10/22/14    single episode of AF x 3 hours 48 minutes recorded on ICD, chads2vasc score is at least 5      Past Surgical History  Procedure Laterality Date  . Cardiac defibrillator placement  08/2004    initial placement, upgraded to [place] ICD by Dr Lovena Le 08/24/11 (MDT)  . Knee arthrotomy  ~ 1978    RIGHT KNEE CARTILAGE REMOVED  . Coronary angioplasty with stent placement  06/2007    BMS to SVG to RCA  . Coronary angioplasty  02/2011  . Cataract extraction w/ intraocular lens  implant, bilateral  2012  . Cardiac defibrillator placement  2009  . Lumbar disc surgery  2003  . Back surgery  2003  . Coronary  artery bypass graft  01/1996    CABG x 5 LIMA to LAD SVG to diag1,2,svg to om,svg to RCA  . Coronary artery bypass graft  03/06/2011    CABG X2; Procedure: REDO CORONARY ARTERY BYPASS GRAFTING (CABG);  Surgeon: Grace Isaac, MD;  Location: [facility];  Service: Open Heart Surgery;  Laterality: N/A;  times two grafts using right saphenous vein harvested endoscopically.  . Cholecystectomy  01/05/2012    Procedure: LAPAROSCOPIC CHOLECYSTECTOMY WITH INTRAOPERATIVE CHOLANGIOGRAM;  Surgeon: Joyice Faster. Cornett, MD;  Location: [place] [facility];  Service: General;  Laterality: N/A;  laparoscopic cholecysectoym with intraoperative cholangiogram  . Colonoscopy with propofol N/A 11/18/2013    Procedure: COLONOSCOPY  WITH PROPOFOL;  Surgeon: Garlan Fair, MD;  Location: [facility];  Service: Endoscopy;  Laterality: N/A;  . Left heart catheterization with coronary angiogram N/A 06/06/2011    Procedure: LEFT HEART CATHETERIZATION WITH CORONARY ANGIOGRAM;  Surgeon: Sueanne Margarita, MD;  Location: [facility] CATH LAB;  Service: Cardiovascular;  Laterality: N/A;  . Venogram N/A 06/08/2011    Procedure: VENOGRAM;  Surgeon: Thompson Grayer, MD;  Location: [facility] CATH LAB;  Service: Cardiovascular;  Laterality: N/A;  . Bi-ventricular implantable cardioverter defibrillator upgrade N/A 08/24/2011    Procedure: BI-VENTRICULAR IMPLANTABLE CARDIOVERTER DEFIBRILLATOR UPGRADE;  Surgeon: Evans Lance, MD;  Location: [facility] CATH LAB;  Service: Cardiovascular;  Laterality: N/A;  . Left heart catheterization with coronary/graft angiogram N/A 08/08/2013    Procedure: LEFT HEART CATHETERIZATION WITH Beatrix Fetters;  Surgeon: Sinclair Grooms, MD;  Location: [facility] CATH LAB;  Service: Cardiovascular;  Laterality: N/A;  . Right heart catheterization N/A 02/17/2014    Procedure: RIGHT HEART CATH;  Surgeon: Larey Dresser, MD;  Location: [facility] CATH LAB;  Service: Cardiovascular;  Laterality: N/A;    Allergies  Allergen Reactions  . Codeine Nausea And Vomiting  . Latex Rash    Specifies adhesive     I have reviewed the patient's current medications   . sodium chloride 20 mL/hr at 08/01/15 1230   nitroGLYCERIN  Prior to Admission medications   Medication Sig Start Date End Date Taking? Authorizing Provider  allopurinol (ZYLOPRIM) 100 MG tablet Take 100 mg by mouth 2 (two) times daily.     Historical Provider, MD  aspirin EC 81 MG tablet Take 81 mg by mouth daily.    Historical Provider, MD  atorvastatin (LIPITOR) 40 MG tablet Take 40 mg by mouth daily at 6 PM.  01/12/14   Historical Provider, MD  buPROPion (WELLBUTRIN XL) 150 MG 24 hr tablet Take 150 mg by mouth daily.    Historical Provider, MD  carvedilol (COREG) 12.5 MG tablet Take 1  tablet (12.5 mg total) by mouth 2 (two) times daily. 02/17/15   Larey Dresser, MD  clopidogrel (PLAVIX) 75 MG tablet Take 1 tablet (75 mg total) by mouth daily. 02/03/14   Thompson Grayer, MD  colchicine 0.6 MG tablet Take 1 tablet (0.6 mg total) by mouth daily as needed (for gout flare). Patient not taking: Reported on 06/14/2015 03/17/15   Larey Dresser, MD  ENTRESTO 49-51 MG TAKE 1 TABLET BY MOUTH TWICE DAILY 06/22/15   Larey Dresser, MD  eplerenone (INSPRA) 25 MG tablet Take 1 tablet (25 mg total) by mouth every evening. 03/17/15   Larey Dresser, MD  furosemide (LASIX) 40 MG tablet Take 40 mg by mouth 2 (two) times daily.    Historical Provider, MD  insulin lispro protamine-lispro (HUMALOG 50/50 MIX) (50-50) 100 UNIT/ML SUSP  injection Inject 20-30 Units into the skin 3 (three) times daily. Takes 30 units in the morning 30 at lunch and 20 at night    Historical Provider, MD  isosorbide mononitrate (IMDUR) 60 MG 24 hr tablet Take 1.5 tablets (90 mg total) by mouth at bedtime. 12/09/14   Larey Dresser, MD  nitroGLYCERIN (NITROSTAT) 0.4 MG SL tablet Place 1 tablet (0.4 mg total) under the tongue every 5 (five) minutes x 3 doses as needed. For chest pain. Patient not taking: Reported on 03/17/2015 11/26/14   Jolaine Artist, MD  omeprazole (PRILOSEC) 20 MG capsule Take 20 mg by mouth daily as needed (heartburn or acid reflux).     Historical Provider, MD  ONE TOUCH ULTRA TEST test strip  09/24/14   Historical Provider, MD  Jonetta Speak LANCETS [id number] [place]  09/24/14   Historical Provider, MD  pantoprazole (PROTONIX) 40 MG tablet Take 1 tablet (40 mg total) by mouth daily. 06/15/15   Jolaine Artist, MD  ranolazine (RANEXA) 500 MG 12 hr tablet Take 1 tablet (500 mg total) by mouth 2 (two) times daily. 02/17/15   Larey Dresser, MD  venlafaxine (EFFEXOR-XR) 150 MG 24 hr capsule Take 150 mg by mouth at bedtime.     Historical Provider, MD  zolpidem (AMBIEN CR) 12.5 MG CR tablet Take 12.5 mg by mouth at  bedtime as needed. For sleep 04/04/11   Historical Provider, MD     Social History   Social History  . Marital Status: Married    Spouse Name: N/A  . Number of Children: N/A  . Years of Education: N/A   Occupational History  . Not on file.   Social History Main Topics  . Smoking status: Former Smoker -- 0.50 packs/day for 15 years    Types: Cigarettes    Quit date: 04/17/1969  . Smokeless tobacco: Former Systems developer    Quit date: 05/05/1971  . Alcohol Use: 0.6 oz/week    1 Cans of beer per week     Comment: occasional  . Drug Use: No  . Sexual Activity: No   Other Topics Concern  . Not on file   Social History Narrative   Lives in [place], retired Theatre manager for [employer].  He collects antique metal toys    Family Status  Relation Status Death Age  . Mother Deceased 46  . Father Deceased 78  . Brother Deceased 54s    sudden death, presumed cardiac  . Brother Deceased 57    had Cancer and hx MI.   Family History  Problem Relation Age of Onset  . Coronary artery disease    . Heart failure Mother   . Heart failure Father   . Heart attack Mother   . Heart attack Father   . Sudden Cardiac Death Brother     in his 67's  . Heart attack Brother   . Cancer Brother      ROS:  Full 14 point review of systems complete and found to be negative unless listed above.  Physical Exam: Blood pressure 115/82, pulse 104, temperature 97.7 F (36.5 C), temperature source Oral, resp. rate 19, SpO2 96 %.  General: Well developed, well nourished, male in no acute distress Head: Eyes PERRLA, No xanthomas.   Normocephalic and atraumatic, oropharynx without edema or exudate.  Lungs: CTA Heart: HRRR S1 S2, no rub/gallop, No murmur. Pulses are 2+ extrem.   Neck: No carotid bruits. No lymphadenopathy.  Jaw to ear JVD.  Abdomen: Bowel sounds present, abdomen soft and non-tender without masses or hernias noted. Msk:  No spine or cva tenderness. No weakness, no joint  deformities or effusions. Extremities: No clubbing or cyanosis.  +2 edema BLE.  Neuro: Alert and oriented X 3. No focal deficits noted. Psych:  Good affect, responds appropriately Skin: No rashes or lesions noted.  Labs:   Lab Results  Component Value Date   WBC 10.7* 08/01/2015   HGB 12.8* 08/01/2015   HCT 38.7* 08/01/2015   MCV 91.9 08/01/2015   PLT 236 08/01/2015    Recent Labs  08/01/15 1216  INR 1.25    Recent Labs  08/01/15 1213  TROPIPOC 0.06    Echo:  - Left ventricle: The cavity size was mildly dilated. Wall  thickness was increased in a pattern of mild LVH. Inferior and  inferolateral akinesis, anterolateral hypokinesis. The estimated  ejection fraction was 35%. Doppler parameters are consistent with  abnormal left ventricular relaxation (grade 1 diastolic  dysfunction). - Aortic valve: There was no stenosis. - Aorta: Mildly dilated aortic root and ascending aorta. Aortic  root dimension: 37 mm (ED). Ascending aortic diameter: 42 mm (S). - Mitral valve: There was mild regurgitation. - Left atrium: The atrium was mildly dilated. - Right ventricle: The cavity size was normal. Pacer wire or  catheter noted in right ventricle. Systolic function was normal. - Right atrium: The atrium was mildly dilated. - Pulmonary arteries: No complete TR doppler jet so unable to  estimate PA systolic pressure. - Inferior vena cava: The vessel was normal in size. The  respirophasic diameter changes were in the normal range (= 50%),  consistent with normal central venous pressure.  Impressions:  - Mildly dilated LV with mild LV hypertrophy. EF 35% with wall  motion abnormalities as noted above. No significant valvular  abnormalities.  Transthoracic echocardiography. M-mode, complete 2D, spectral Doppler, and color Doppler. Birthdate: Patient birthdate: 1940-12-07. Age: Patient is 75 yr old. Sex: Gender: male. BMI: 30.5 kg/m^2. Blood pressure:  108/60 Patient status: Outpatient. Study date: Study date: 06/30/2015. Study time: 01:15 PM. Location: [facility]  -------------------------------------------------------------------  ------------------------------------------------------------------- Left ventricle: The cavity size was mildly dilated. Wall thickness was increased in a pattern of mild LVH. Inferior and inferolateral akinesis, anterolateral hypokinesis. The estimated ejection fraction was 35%. Doppler parameters are consistent with abnormal left ventricular relaxation (grade 1 diastolic dysfunction).  ECG:   Left heart cath: IMPRESSIONS:  1. Ischemic cardiomyopathy with LVEF less than 20%, with progressive LV dysfunction related to bypass graft failure.  2. Occlusion of the saphenous vein grafts placed in 2012 to the right coronary and to the circumflex  3. Widely patent LIMA to the LAD  4. Totally occluded native right coronary, proximal LAD, and 80% distal left main (unchanged since 2011)   ASSESSMENT AND PLAN:    Active Problems:   Ischemic cardiomyopathy   Hyperlipemia   CAD (coronary artery disease)   HTN (hypertension)   DM (diabetes mellitus) (HCC)   Chronic systolic CHF (congestive heart failure) (HCC)   Paroxysmal atrial fibrillation (HCC)   CKD (chronic kidney disease) stage 3, GFR 30-59 ml/min  1. Chest pain: Resolved currently.  He has known severe CAD.  We will follow troponin, place on heparin gtt.  No benefit in cath as patient has known severe CAD and not a candidate for PCI. Also this may be related to volume overload as patient reports drinking more yesterday including 2 beers and he usually does not drink.  We can increase his Ranexa to help with angina.   2. Ischemic Cardiomyopathy: Followed by Dr. Aundra Dubin.    3. HTN: Blood pressure well controlled on current regimen.    4. Chronic systolic CHF: Last EF [id number].  He is volume overloaded on exam with jaw JVD. We will diurese with  IV Lasix and follow his creatinine closely.    5. CKD stage 3: baseline Cr is 1.3-1.4.    Signed: Arbutus Leas, NP 08/01/2015 1:36 PM Pager [phone]  Co-Sign MD   Patient seen and examined with Jettie Booze, NP-C. We discussed all aspects of the encounter. I agree with the assessment and plan as stated above.   Patient with known severe CAD and CHF presents with chest pressure which is slightly different from previous MI. ECG is paced. Troponin 0.06. On exam has volume overload which may be cause of symptoms. Will diurese. Cover with heparin as we cycle markers. I have reviewed most recent cath films and stress test. He as occluded grafts except for LIMA. LM ~80% into ostial LCX but very calcified and poor PCI target (has been reviewed with interventionalists previously). Can increase Ranexa as needed.   Makih Stefanko,MD 1:48 PM

## 2015-08-02 NOTE — Progress Notes (Signed)
ANTICOAGULATION CONSULT NOTE - FOLLOW UP  Pharmacy Consult:  Heparin  Indication: chest pain/ACS  Allergies  Allergen Reactions  . Codeine Nausea And Vomiting  . Latex Rash    Specifies adhesive     Patient Measurements: Height: 5\' 6"  (167.6 cm) Weight: 205 lb 14.6 oz (93.4 kg) IBW/kg (Calculated) : 63.8 Heparin Dosing Weight: 84 kg  Vital Signs: Temp: 97.6 F (36.4 C) (04/17 1600) Temp Source: Oral (04/17 1600) BP: 97/60 mmHg (04/17 1600) Pulse Rate: 65 (04/17 1600)  Labs:  Recent Labs  08/01/15 1216 08/01/15 1404 08/01/15 1955 08/01/15 2316 08/02/15 0229 08/02/15 0827 08/02/15 0921 08/02/15 1726  HGB 12.8*  --   --   --  11.9*  --   --   --   HCT 38.7*  --   --   --  36.7*  --   --   --   PLT 236  --   --   --  230  --   --   --   APTT 28  --   --   --   --   --   --   --   LABPROT 15.8*  --   --   --   --   --   --   --   INR 1.25  --   --   --   --   --   --   --   HEPARINUNFRC  --   --   --  0.15*  --  0.45  --  0.35  CREATININE 1.43*  --   --   --   --   --  1.18  --   TROPONINI  --  0.18* 4.64*  --  6.59*  --   --   --     Estimated Creatinine Clearance: 60.5 mL/min (by C-G formula based on Cr of 1.18).   Assessment: 85 YOM continues on IV heparin for ACS.  Heparin level remains therapeutic; no bleeding reported.  Goal of Therapy:  Heparin level 0.3-0.7 units/ml Monitor platelets by anticoagulation protocol: Yes    Plan:  Continue heparin gtt at 1300 units/hr Monitor daily HL, CBC, s/s of bleed Consider D/C Imdur as patient is on NTG gtt F/U resuming home insulin regimen for better glycemic control   Elenor Quinones, PharmD, BCPS Clinical Pharmacist Pager [phone] 08/02/2015 7:01 PM

## 2015-08-02 NOTE — Progress Notes (Signed)
CRITICAL VALUE ALERT  Critical value received:  Troponin 6.59  Date of notification:  08/02/2015  Time of notification:  0345  Critical value read back:No. RN not notified by lab  Nurse who received alert:  Annice Pih  MD notified (1st page):  Cards fellow  Time of first page:  [phone]  MD notified (2nd page):  Time of second page:  Responding MD:    Time MD responded:

## 2015-08-02 NOTE — Progress Notes (Signed)
ANTICOAGULATION CONSULT NOTE - FOLLOW UP  Pharmacy Consult:  Heparin  Indication: chest pain/ACS  Allergies  Allergen Reactions  . Codeine Nausea And Vomiting  . Latex Rash    Specifies adhesive     Patient Measurements: Height: 5\' 6"  (167.6 cm) Weight: 205 lb 14.6 oz (93.4 kg) IBW/kg (Calculated) : 63.8 Heparin Dosing Weight: 84 kg  Vital Signs: Temp: 98 F (36.7 C) (04/17 0754) Temp Source: Oral (04/17 0754) BP: 100/64 mmHg (04/17 0800) Pulse Rate: 71 (04/17 0800)  Labs:  Recent Labs  08/01/15 1216 08/01/15 1404 08/01/15 1955 08/01/15 2316 08/02/15 0229 08/02/15 0827 08/02/15 0921  HGB 12.8*  --   --   --  11.9*  --   --   HCT 38.7*  --   --   --  36.7*  --   --   PLT 236  --   --   --  230  --   --   APTT 28  --   --   --   --   --   --   LABPROT 15.8*  --   --   --   --   --   --   INR 1.25  --   --   --   --   --   --   HEPARINUNFRC  --   --   --  0.15*  --  0.45  --   CREATININE 1.43*  --   --   --   --   --  1.18  TROPONINI  --  0.18* 4.64*  --  6.59*  --   --     Estimated Creatinine Clearance: 60.5 mL/min (by C-G formula based on Cr of 1.18).    Assessment: 64 YOM continues on IV heparin for ACS.  Heparin level is therapeutic; no bleeding reported.   Goal of Therapy:  Heparin level 0.3-0.7 units/ml Monitor platelets by anticoagulation protocol: Yes    Plan:  - Continue heparin gtt at 1300 units/hr - Check confirmatory HL - Daily HL / CBC - Consider D/C Imdur as patient is on NTG gtt - F/U resuming home insulin regimen for better glycemic control    Tomi Grandpre D. Mina Marble, PharmD, BCPS Pager:  [phone] 08/02/2015, 11:21 AM

## 2015-08-02 NOTE — Progress Notes (Signed)
*  PRELIMINARY RESULTS* Echocardiogram 2D Echocardiogram has been performed.  [name] 08/02/2015, 4:54 PM

## 2015-08-02 NOTE — Progress Notes (Signed)
ANTICOAGULATION CONSULT NOTE - Follow Up Consult  Pharmacy Consult for Heparin  Indication: chest pain/ACS  Allergies  Allergen Reactions  . Codeine Nausea And Vomiting  . Latex Rash    Specifies adhesive    Patient Measurements: Height: 5\' 6"  (167.6 cm) Weight: 205 lb 14.6 oz (93.4 kg) IBW/kg (Calculated) : 63.8  Vital Signs: Temp: 97.6 F (36.4 C) (04/16 1937) Temp Source: Oral (04/16 1937) BP: 132/81 mmHg (04/16 2239) Pulse Rate: 84 (04/16 2239)  Labs:  Recent Labs  08/01/15 1216 08/01/15 1404 08/01/15 1955 08/01/15 2316  HGB 12.8*  --   --   --   HCT 38.7*  --   --   --   PLT 236  --   --   --   APTT 28  --   --   --   LABPROT 15.8*  --   --   --   INR 1.25  --   --   --   HEPARINUNFRC  --   --   --  0.15*  CREATININE 1.43*  --   --   --   TROPONINI  --  0.18* 4.64*  --     Estimated Creatinine Clearance: 49.9 mL/min (by C-G formula based on Cr of 1.43).   Assessment: Heparin for rising troponin, initial heparin level is low, no issues per RN. Unsure if bolus was given earlier today (it wasn't charted).   Goal of Therapy:  Heparin level 0.3-0.7 units/ml Monitor platelets by anticoagulation protocol: Yes   Plan:  -Heparin 2000 units BOLUS -Increase heparin to 1300 units/hr -0830 HL  Kathe Wirick 08/02/2015,12:18 AM

## 2015-08-02 NOTE — Progress Notes (Addendum)
Patient ID: [name], male   DOB: Jun 13, 1940, 75 y.o.   MRN: [id number]   SUBJECTIVE: Chest pain mostly resolved, "very slight" discomfort now.  Troponin to 6.59.  No dyspnea at rest.   ECG: NSR, BiV paced  Scheduled Meds: . allopurinol  100 mg Oral BID  . aspirin  324 mg Oral NOW   Or  . aspirin  300 mg Rectal NOW  . aspirin EC  81 mg Oral Daily  . atorvastatin  40 mg Oral q1800  . carvedilol  12.5 mg Oral BID  . clopidogrel  75 mg Oral Daily  . eplerenone  25 mg Oral Daily  . furosemide  80 mg Intravenous BID  . insulin aspart  0-9 Units Subcutaneous TID WC  . isosorbide mononitrate  90 mg Oral QHS  . pantoprazole  40 mg Oral Daily  . ranolazine  1,000 mg Oral BID  . sacubitril-valsartan  1 tablet Oral BID  . venlafaxine XR  150 mg Oral QHS   Continuous Infusions: . sodium chloride 20 mL/hr at 08/01/15 1230  . heparin 1,300 Units/hr (08/02/15 0022)  . nitroGLYCERIN 12 mcg/min (08/01/15 1755)   PRN Meds:.acetaminophen, ALPRAZolam, morphine injection, nitroGLYCERIN, ondansetron (ZOFRAN) IV, zolpidem    Filed Vitals:   08/02/15 0000 08/02/15 0400 08/02/15 0754 08/02/15 0800  BP: 99/63 99/59 90/64  100/64  Pulse: 90 72 71 71  Temp: 98 F (36.7 C) 97.8 F (36.6 C) 98 F (36.7 C)   TempSrc: Oral Oral Oral   Resp: 22 15 18 13   Height:      Weight:      SpO2: 96% 96% 98% 96%    Intake/Output Summary (Last 24 hours) at 08/02/15 0852 Last data filed at 08/02/15 0800  Gross per 24 hour  Intake 371.01 ml  Output    920 ml  Net -548.99 ml    LABS: Basic Metabolic Panel:  Recent Labs  08/01/15 1216  NA 133*  K 4.1  CL 98*  CO2 22  GLUCOSE 389*  BUN 26*  CREATININE 1.43*  CALCIUM 9.2   Liver Function Tests:  Recent Labs  08/01/15 1216  AST 23  ALT 20  ALKPHOS 90  BILITOT 0.6  PROT 6.7  ALBUMIN 3.4*   No results for input(s): LIPASE, AMYLASE in the last 72 hours. CBC:  Recent Labs  08/01/15 1216 08/02/15 0229  WBC 10.7* 9.7  HGB 12.8*  11.9*  HCT 38.7* 36.7*  MCV 91.9 92.0  PLT 236 230   Cardiac Enzymes:  Recent Labs  08/01/15 1404 08/01/15 1955 08/02/15 0229  TROPONINI 0.18* 4.64* 6.59*   BNP: Invalid input(s): POCBNP D-Dimer: No results for input(s): DDIMER in the last 72 hours. Hemoglobin A1C: No results for input(s): HGBA1C in the last 72 hours. Fasting Lipid Panel: No results for input(s): CHOL, HDL, LDLCALC, TRIG, CHOLHDL, LDLDIRECT in the last 72 hours. Thyroid Function Tests: No results for input(s): TSH, T4TOTAL, T3FREE, THYROIDAB in the last 72 hours.  Invalid input(s): FREET3 Anemia Panel: No results for input(s): VITAMINB12, FOLATE, FERRITIN, TIBC, IRON, RETICCTPCT in the last 72 hours.  RADIOLOGY: Dg Chest Port 1 View  08/01/2015  CLINICAL DATA:  75 year old male with a history of chest pain and STEMI EXAM: PORTABLE CHEST 1 VIEW COMPARISON:  12/24/2011 FINDINGS: Cardiomediastinal silhouette is unchanged, though distorted with the apical lordotic positioning. Cardiac pacing device/ defibrillator on the left chest wall is unchanged. Surgical changes of median sternotomy and CABG. Low lung volumes with interstitial opacities. No pneumothorax  or large pleural effusion. No large confluent airspace disease. IMPRESSION: Low lung volumes with likely atelectasis and chronic lung changes with no evidence of pneumonia or significant edema. Surgical changes in median sternotomy and CABG. Unchanged left chest wall AICD. Signed, Dulcy Fanny. [name], DO [facility] Radiology Electronically Signed   By: Corrie Mckusick D.O.   On: 08/01/2015 13:17    PHYSICAL EXAM General: NAD Neck: JVP 12 cm, no thyromegaly or thyroid nodule.  Lungs: Clear to auscultation bilaterally with normal respiratory effort. CV: Lateral PMI.  Heart regular S1/S2, no S3/S4, no murmur.  No peripheral edema.   Abdomen: Soft, nontender, no hepatosplenomegaly, no distention.  Neurologic: Alert and  oriented x 3.  Psych: Normal affect. Extremities: No clubbing or cyanosis.   TELEMETRY: Reviewed telemetry pt in NSR  ASSESSMENT AND PLAN: 75 yo with history of CAD s/p CABG and redo CABG as well as ischemic CMP and Medtronic CRT-D presents with NSTEMI and acute on chronic systolic CHF.  1. Acute on chronic systolic CHF: EF [id number] on last echo. He has a Medtronic CRT-D device. He is volume overloaded on exam. - Lasix 80 mg IV bid today.  - Continue current eplerenone and Entresto.  - Continue Coreg 12.5 mg bid.  - Echo to reassess LV function in setting of NSTEMI.  2. CAD: s/p CABG and redo. 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 Has had angina chronically. Ranolazine and Imdur have helped with exertional chest pain. This admission, he had prolonged chest pain and NSTEMI with troponin to 6.59.  He has all SVGs and RCA known to be occluded.  LIMA patent on last cath, suspect still patent as not massive MI.  In past, he had 80% calcified distal left main => this was considered for PCI before and thought to be too high risk.   - Will need to review old films with interventional colleagues in light of new event.  If LM not thought to be viable target for intervention, would not do repeat cath and would manage medically.  - Continue ASA 81, Plavix, statin.  - Continue Imdur and Coreg. - Ranolazine increased to 1000 mg bid.  - Continue IV heparin gtt.   3. CKD: Needs BMET today.  [name] 08/02/2015 9:02 AM  Reviewed prior films with Drs Martinique and Ellyn Hack.  Intervention will be difficult but not totally unfeasible looking at old films.  Will arrange for angiography +/- PCI attempt with Dr Martinique tomorrow.   BP running low, stop NTG gtt and decrease Entresto to 24/26 bid.   [name] 08/02/2015 3:32 PM

## 2015-08-03 ENCOUNTER — Encounter (HOSPITAL_COMMUNITY)
Admission: EM | Disposition: A | Discharge status: Home / Self Care | Payer: Self-pay | Source: Home / Self Care | Attending: Internal Medicine

## 2015-08-03 DIAGNOSIS — I2510 Atherosclerotic heart disease of native coronary artery without angina pectoris: Secondary | ICD-10-CM

## 2015-08-03 DIAGNOSIS — I200 Unstable angina: Secondary | ICD-10-CM | POA: Insufficient documentation

## 2015-08-03 DIAGNOSIS — N183 Chronic kidney disease, stage 3 (moderate): Secondary | ICD-10-CM

## 2015-08-03 HISTORY — PX: CARDIAC CATHETERIZATION: SHX172

## 2015-08-03 LAB — GLUCOSE, CAPILLARY
GLUCOSE-CAPILLARY: 153 mg/dL — AB (ref 65–99)
GLUCOSE-CAPILLARY: 149 mg/dL — AB (ref 65–99)
GLUCOSE-CAPILLARY: 150 mg/dL — AB (ref 65–99)
GLUCOSE-CAPILLARY: 148 mg/dL — AB (ref 65–99)
GLUCOSE-CAPILLARY: 160 mg/dL — AB (ref 65–99)

## 2015-08-03 LAB — CBC
HCT: 37.9 % — ABNORMAL LOW (ref 39.0–52.0)
HEMOGLOBIN: 12.2 g/dL — AB (ref 13.0–17.0)
MCH: 29.8 pg (ref 26.0–34.0)
MCHC: 32.2 g/dL (ref 30.0–36.0)
MCV: 92.4 fL (ref 78.0–100.0)
PLATELETS: 218 10*3/uL (ref 150–400)
RBC: 4.1 MIL/uL — AB (ref 4.22–5.81)
RDW: 15.1 % (ref 11.5–15.5)
WBC: 9.2 10*3/uL (ref 4.0–10.5)

## 2015-08-03 LAB — BASIC METABOLIC PANEL
Anion gap: 11 (ref 5–15)
BUN: 24 mg/dL — ABNORMAL HIGH (ref 6–20)
CHLORIDE: 101 mmol/L (ref 101–111)
CO2: 26 mmol/L (ref 22–32)
CREATININE: 1.36 mg/dL — AB (ref 0.61–1.24)
Calcium: 8.6 mg/dL — ABNORMAL LOW (ref 8.9–10.3)
GFR calc Af Amer: 58 mL/min — ABNORMAL LOW (ref >60)
GFR calc non Af Amer: 50 mL/min — ABNORMAL LOW (ref >60)
Glucose, Bld: 181 mg/dL — ABNORMAL HIGH (ref 65–99)
Potassium: 3.8 mmol/L (ref 3.5–5.1)
SODIUM: 138 mmol/L (ref 135–145)

## 2015-08-03 LAB — POCT ACTIVATED CLOTTING TIME: Activated Clotting Time: 147 seconds

## 2015-08-03 LAB — HEPARIN LEVEL (UNFRACTIONATED): HEPARIN UNFRACTIONATED: 0.32 [IU]/mL (ref 0.30–0.70)

## 2015-08-03 SURGERY — LEFT HEART CATH AND CORS/GRAFTS ANGIOGRAPHY

## 2015-08-03 MED ORDER — HEPARIN (PORCINE) IN NACL 2-0.9 UNIT/ML-% IJ SOLN
INTRAMUSCULAR | Status: DC | PRN
  Administered 2015-08-03: 1000 mL

## 2015-08-03 MED ORDER — MIDAZOLAM HCL 2 MG/2ML IJ SOLN
INTRAMUSCULAR | Status: DC | PRN
  Administered 2015-08-03: 1 mg via INTRAVENOUS

## 2015-08-03 MED ORDER — MIDAZOLAM HCL 2 MG/2ML IJ SOLN
INTRAMUSCULAR | Status: AC
  Filled 2015-08-03: qty 2

## 2015-08-03 MED ORDER — HEPARIN (PORCINE) IN NACL 100-0.45 UNIT/ML-% IJ SOLN
1350.0000 [IU]/h | INTRAMUSCULAR | Status: DC
Start: 2015-08-03 — End: 2015-08-04
  Administered 2015-08-03: 1350 [IU]/h via INTRAVENOUS

## 2015-08-03 MED ORDER — FENTANYL CITRATE (PF) 100 MCG/2ML IJ SOLN
INTRAMUSCULAR | Status: DC | PRN
Start: 2015-08-03 — End: 2015-08-03
  Administered 2015-08-03: 25 ug via INTRAVENOUS

## 2015-08-03 MED ORDER — IOPAMIDOL (ISOVUE-370) INJECTION 76%
INTRAVENOUS | Status: DC | PRN
  Administered 2015-08-03: 60 mL via INTRA_ARTERIAL

## 2015-08-03 MED ORDER — ISOSORBIDE MONONITRATE ER 30 MG PO TB24
120.0000 mg | ORAL_TABLET | Freq: Every day | ORAL | Status: DC
  Administered 2015-08-03: 120 mg via ORAL
  Filled 2015-08-03: qty 4

## 2015-08-03 MED ORDER — IOPAMIDOL (ISOVUE-370) INJECTION 76%
INTRAVENOUS | Status: AC
  Filled 2015-08-03: qty 100

## 2015-08-03 MED ORDER — LIDOCAINE HCL (PF) 1 % IJ SOLN
INTRAMUSCULAR | Status: AC
  Filled 2015-08-03: qty 30

## 2015-08-03 MED ORDER — HEPARIN (PORCINE) IN NACL 2-0.9 UNIT/ML-% IJ SOLN
INTRAMUSCULAR | Status: AC
  Filled 2015-08-03: qty 1000

## 2015-08-03 MED ORDER — CARVEDILOL 12.5 MG PO TABS: 12.5000 mg | ORAL_TABLET | Freq: Two times a day (BID) | ORAL | Status: DC

## 2015-08-03 MED ORDER — SODIUM CHLORIDE 0.9% FLUSH: 3.0000 mL | INTRAVENOUS | Status: DC | PRN

## 2015-08-03 MED ORDER — NITROGLYCERIN 1 MG/10 ML FOR IR/CATH LAB
INTRA_ARTERIAL | Status: AC
  Filled 2015-08-03: qty 10

## 2015-08-03 MED ORDER — FENTANYL CITRATE (PF) 100 MCG/2ML IJ SOLN
INTRAMUSCULAR | Status: AC
  Filled 2015-08-03: qty 2

## 2015-08-03 MED ORDER — SODIUM CHLORIDE 0.9% FLUSH
3.0000 mL | Freq: Two times a day (BID) | INTRAVENOUS | Status: DC
  Administered 2015-08-04 – 2015-08-07 (×6): 3 mL via INTRAVENOUS

## 2015-08-03 MED ORDER — SODIUM CHLORIDE 0.9% FLUSH
3.0000 mL | Freq: Two times a day (BID) | INTRAVENOUS | Status: DC
  Administered 2015-08-04 – 2015-08-08 (×9): 3 mL via INTRAVENOUS

## 2015-08-03 MED ORDER — LIDOCAINE HCL (PF) 1 % IJ SOLN
INTRAMUSCULAR | Status: DC | PRN
  Administered 2015-08-03: 16 mL

## 2015-08-03 MED ORDER — SODIUM CHLORIDE 0.9 % IV SOLN: 250.0000 mL | INTRAVENOUS | Status: DC | PRN

## 2015-08-03 SURGICAL SUPPLY — 10 items
CATH INFINITI 5 FR IM (CATHETERS) ×2 IMPLANT
CATH INFINITI 5FR MULTPACK ANG (CATHETERS) ×2 IMPLANT
GLIDESHEATH SLEND SS 6F .021 (SHEATH) IMPLANT
KIT HEART LEFT (KITS) ×3 IMPLANT
PACK CARDIAC CATHETERIZATION (CUSTOM PROCEDURE TRAY) ×3 IMPLANT
SHEATH PINNACLE 5F 10CM (SHEATH) ×2 IMPLANT
TRANSDUCER W/STOPCOCK (MISCELLANEOUS) ×3 IMPLANT
TUBING CIL FLEX 10 FLL-RA (TUBING) ×3 IMPLANT
WIRE EMERALD 3MM-J .035X150CM (WIRE) ×2 IMPLANT
WIRE SAFE-T 1.5MM-J .035X260CM (WIRE) IMPLANT

## 2015-08-03 NOTE — Progress Notes (Signed)
Patient ID: [name], male   DOB: Sep 12, 1940, 75 y.o.   MRN: [id number]   SUBJECTIVE: No further chest pain today.  No dyspnea at rest.  Some diuresis yesterday, weighed in bed so not sure accuracy.  ECG: NSR, BiV paced  Scheduled Meds: . allopurinol  100 mg Oral BID  . [START ON 08/04/2015] aspirin EC  81 mg Oral Daily  . atorvastatin  80 mg Oral q1800  . carvedilol  12.5 mg Oral BID  . clopidogrel  75 mg Oral Daily  . eplerenone  25 mg Oral Daily  . insulin aspart  0-9 Units Subcutaneous TID WC  . isosorbide mononitrate  120 mg Oral Daily  . pantoprazole  40 mg Oral Daily  . ranolazine  1,000 mg Oral BID  . sacubitril-valsartan  1 tablet Oral BID  . sodium chloride flush  3 mL Intravenous Q12H  . venlafaxine XR  150 mg Oral QHS   Continuous Infusions: . sodium chloride 20 mL/hr at 08/01/15 1230  . sodium chloride 10 mL/hr at 08/02/15 2334  . heparin 1,300 Units/hr (08/03/15 0631)   PRN Meds:.sodium chloride, acetaminophen, ALPRAZolam, morphine injection, nitroGLYCERIN, ondansetron (ZOFRAN) IV, sodium chloride flush, zolpidem    Filed Vitals:   08/02/15 2000 08/02/15 2337 08/03/15 0400 08/03/15 0500  BP: 96/56 108/60 99/55   Pulse: 71 75 72   Temp: 96.8 F (36 C) 97.9 F (36.6 C) 97.7 F (36.5 C)   TempSrc: Axillary Oral Oral   Resp: 19 19 14    Height:      Weight:    208 lb 8 oz (94.575 kg)  SpO2: 97% 97% 94%     Intake/Output Summary (Last 24 hours) at 08/03/15 0834 Last data filed at 08/03/15 0500  Gross per 24 hour  Intake 1072.53 ml  Output   2600 ml  Net -1527.47 ml    LABS: Basic Metabolic Panel:  Recent Labs  08/02/15 0921 08/03/15 0310  NA 138 138  K 4.0 3.8  CL 103 101  CO2 24 26  GLUCOSE 171* 181*  BUN 19 24*  CREATININE 1.18 1.36*  CALCIUM 8.7* 8.6*   Liver Function Tests:  Recent Labs  08/01/15 1216  AST 23  ALT 20  ALKPHOS 90  BILITOT 0.6  PROT 6.7  ALBUMIN 3.4*   No results for input(s): LIPASE, AMYLASE in the last  72 hours. CBC:  Recent Labs  08/02/15 0229 08/03/15 0310  WBC 9.7 9.2  HGB 11.9* 12.2*  HCT 36.7* 37.9*  MCV 92.0 92.4  PLT 230 218   Cardiac Enzymes:  Recent Labs  08/01/15 1404 08/01/15 1955 08/02/15 0229  TROPONINI 0.18* 4.64* 6.59*   BNP: Invalid input(s): POCBNP D-Dimer: No results for input(s): DDIMER in the last 72 hours. Hemoglobin A1C: No results for input(s): HGBA1C in the last 72 hours. Fasting Lipid Panel: No results for input(s): CHOL, HDL, LDLCALC, TRIG, CHOLHDL, LDLDIRECT in the last 72 hours. Thyroid Function Tests: No results for input(s): TSH, T4TOTAL, T3FREE, THYROIDAB in the last 72 hours.  Invalid input(s): FREET3 Anemia Panel: No results for input(s): VITAMINB12, FOLATE, FERRITIN, TIBC, IRON, RETICCTPCT in the last 72 hours.  RADIOLOGY: Dg Chest Port 1 View  08/01/2015  CLINICAL DATA:  75 year old male with a history of chest pain and STEMI EXAM: PORTABLE CHEST 1 VIEW COMPARISON:  12/24/2011 FINDINGS: Cardiomediastinal silhouette is unchanged, though distorted with the apical lordotic positioning. Cardiac pacing device/ defibrillator on the left chest wall is unchanged. Surgical changes of median sternotomy  and CABG. Low lung volumes with interstitial opacities. No pneumothorax or large pleural effusion. No large confluent airspace disease. IMPRESSION: Low lung volumes with likely atelectasis and chronic lung changes with no evidence of pneumonia or significant edema. Surgical changes in median sternotomy and CABG. Unchanged left chest wall AICD. Signed, Dulcy Fanny. [name], DO [facility] Radiology Electronically Signed   By: Corrie Mckusick D.O.   On: 08/01/2015 13:17    PHYSICAL EXAM General: NAD Neck: JVP 8 cm, no thyromegaly or thyroid nodule.  Lungs: Clear to auscultation bilaterally with normal respiratory effort. CV: Lateral PMI.  Heart regular S1/S2, no S3/S4, no murmur.  No peripheral edema.     Abdomen: Soft, nontender, no hepatosplenomegaly, no distention.  Neurologic: Alert and oriented x 3.  Psych: Normal affect. Extremities: No clubbing or cyanosis.   TELEMETRY: Reviewed telemetry pt in NSR  ASSESSMENT AND PLAN: 75 yo with history of CAD s/p CABG and redo CABG as well as ischemic CMP and Medtronic CRT-D presents with NSTEMI and acute on chronic systolic CHF.  1. Acute on chronic systolic CHF: EF [id number] on echo this admission. He has a Medtronic CRT-D device. He diuresed with IV Lasix yesterday, volume status looks better today. - Hold Lasix today pending coronary angiography.  - Continue current eplerenone and Coreg.  - BP on the soft side, I decreased his Entresto dose.  2. CAD: s/p CABG and redo. 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 Has had angina chronically. Ranolazine and Imdur have helped with exertional chest pain. This admission, he had prolonged chest pain and NSTEMI with troponin to 6.59.  He has all SVGs and RCA known to be occluded.  LIMA patent on last cath, suspect still patent as not massive MI.  In past, he had 80% calcified distal left main => this was considered for PCI before and thought to be too high risk. I reviewed old films with Drs Ellyn Hack and Martinique yesterday, will take for coronary angiography today and consider intervention given new event.  - Continue ASA 81, Plavix, statin.  - Continue Imdur and Coreg. - Ranolazine increased to 1000 mg bid.  - Continue IV heparin gtt.   - Coronary angiography today.  3. CKD: Stage III.  Creatinine is near his baseline.  I will hold Lasix today pending angiography, volume status looks better than yesterday.  [name] 08/03/2015 8:34 AM

## 2015-08-03 NOTE — Interval H&P Note (Signed)
History and Physical Interval Note:  08/03/2015 1:25 PM  [name]  has presented today for surgery, with the diagnosis of cp  The various methods of treatment have been discussed with the patient and family. After consideration of risks, benefits and other options for treatment, the patient has consented to  Procedure(s): Left Heart Cath and Coronary Angiography (N/A) as a surgical intervention .  The patient's history has been reviewed, patient examined, no change in status, stable for surgery.  I have reviewed the patient's chart and labs.  Questions were answered to the patient's satisfaction.   Cath Lab Visit (complete for each Cath Lab visit)  Clinical Evaluation Leading to the Procedure:   ACS: Yes.    Non-ACS:    Anginal Classification: CCS IV  Anti-ischemic medical therapy: Maximal Therapy (2 or more classes of medications)  Non-Invasive Test Results: No non-invasive testing performed  Prior CABG: Previous CABG        [name] 08/03/2015 1:27 PM

## 2015-08-03 NOTE — Progress Notes (Signed)
Dr. Martinique notified of SBP 73/52

## 2015-08-03 NOTE — Progress Notes (Signed)
ANTICOAGULATION CONSULT NOTE - FOLLOW UP  Pharmacy Consult:  Heparin  Indication: chest pain/ACS  Allergies  Allergen Reactions  . Codeine Nausea And Vomiting  . Latex Rash    Specifies adhesive    Labs:  Recent Labs  08/01/15 1216 08/01/15 1404 08/01/15 1955  08/02/15 0229 08/02/15 0827 08/02/15 0921 08/02/15 1726 08/03/15 0310  HGB 12.8*  --   --   --  11.9*  --   --   --  12.2*  HCT 38.7*  --   --   --  36.7*  --   --   --  37.9*  PLT 236  --   --   --  230  --   --   --  218  APTT 28  --   --   --   --   --   --   --   --   LABPROT 15.8*  --   --   --   --   --   --   --   --   INR 1.25  --   --   --   --   --   --   --   --   HEPARINUNFRC  --   --   --   < >  --  0.45  --  0.35 0.32  CREATININE 1.43*  --   --   --   --   --  1.18  --  1.36*  TROPONINI  --  0.18* 4.64*  --  6.59*  --   --   --   --   < > = values in this interval not displayed.  Estimated Creatinine Clearance: 52.8 mL/min (by C-G formula based on Cr of 1.36).   Assessment: 74 YOM to restart heparin 8 hours after sheath removal with plans for staged PCI in a few days (sheath removed at 15:30 pm)   Goal of Therapy:  Heparin level 0.3-0.7 units/ml Monitor platelets by anticoagulation protocol: Yes    Plan:  Restart heparin at 1350 units / hr at 2315 pm 8 hour heparin level and CBC 4/19 Daily heparin level, CBC starting 4/20  Thank you Anette Guarneri, PharmD [phone]  08/03/2015 5:27 PM

## 2015-08-03 NOTE — Progress Notes (Signed)
CBG 150 ACT 147 Sheath in rfa

## 2015-08-03 NOTE — Progress Notes (Signed)
Pt extremely agitated, confused and refusing medications/vitals. Family called and coming to bedside. Cardiology notified and aware of pt status. Family told nurse over phone that it is common for pt to become confused after a procedure requiring sedation. Will continue to monitor pt.

## 2015-08-03 NOTE — Progress Notes (Signed)
Pt [place] position and 2lpm nasal 02 initiated.   SOB and [place] Pattern lessened.  Sheath removed from rfa and hemostasis achieved after 20 minutes of direct pressure.  Op site applied to site and weak Rt DP palpable. Pt instructions given and understood.

## 2015-08-03 NOTE — Progress Notes (Signed)
Inpatient Diabetes Program Recommendations  AACE/ADA: New Consensus Statement on Inpatient Glycemic Control (2015)  Target Ranges:  Prepandial:   less than 140 mg/dL      Peak postprandial:   less than 180 mg/dL (1-2 hours)      Critically ill patients:  140 - 180 mg/dL  Results for [name], [name] (MRN [id number]) as of 08/03/2015 10:10  Ref. Range 08/02/2015 07:55 08/02/2015 12:47 08/02/2015 16:52 08/02/2015 21:43 08/03/2015 07:36  Glucose-Capillary Latest Ref Range: 65-99 mg/dL 156 (H) 251 (H) 193 (H) 201 (H) 153 (H)   Review of Glycemic Control  Current orders for Inpatient glycemic control: Novolog 0-9 units TID with meals  Inpatient Diabetes Program Recommendations: Correction (SSI): Please consider increasing Novolog correction to moderate scale and adding Novolog BEDTIME correction scale.  Thanks, Barnie Alderman, RN, MSN, CDE Diabetes Coordinator Inpatient Diabetes Program [phone] (Team Pager from [place] to [place]) [phone] ([facility] office) [phone] [facility] office) [phone] [facility] office)

## 2015-08-03 NOTE — H&P (View-Only) (Signed)
Patient ID: [name], male   DOB: 1940-08-16, 75 y.o.   MRN: [id number]   SUBJECTIVE: No further chest pain today.  No dyspnea at rest.  Some diuresis yesterday, weighed in bed so not sure accuracy.  ECG: NSR, BiV paced  Scheduled Meds: . allopurinol  100 mg Oral BID  . [START ON 08/04/2015] aspirin EC  81 mg Oral Daily  . atorvastatin  80 mg Oral q1800  . carvedilol  12.5 mg Oral BID  . clopidogrel  75 mg Oral Daily  . eplerenone  25 mg Oral Daily  . insulin aspart  0-9 Units Subcutaneous TID WC  . isosorbide mononitrate  120 mg Oral Daily  . pantoprazole  40 mg Oral Daily  . ranolazine  1,000 mg Oral BID  . sacubitril-valsartan  1 tablet Oral BID  . sodium chloride flush  3 mL Intravenous Q12H  . venlafaxine XR  150 mg Oral QHS   Continuous Infusions: . sodium chloride 20 mL/hr at 08/01/15 1230  . sodium chloride 10 mL/hr at 08/02/15 2334  . heparin 1,300 Units/hr (08/03/15 0631)   PRN Meds:.sodium chloride, acetaminophen, ALPRAZolam, morphine injection, nitroGLYCERIN, ondansetron (ZOFRAN) IV, sodium chloride flush, zolpidem    Filed Vitals:   08/02/15 2000 08/02/15 2337 08/03/15 0400 08/03/15 0500  BP: 96/56 108/60 99/55   Pulse: 71 75 72   Temp: 96.8 F (36 C) 97.9 F (36.6 C) 97.7 F (36.5 C)   TempSrc: Axillary Oral Oral   Resp: 19 19 14    Height:      Weight:    208 lb 8 oz (94.575 kg)  SpO2: 97% 97% 94%     Intake/Output Summary (Last 24 hours) at 08/03/15 0834 Last data filed at 08/03/15 0500  Gross per 24 hour  Intake 1072.53 ml  Output   2600 ml  Net -1527.47 ml    LABS: Basic Metabolic Panel:  Recent Labs  08/02/15 0921 08/03/15 0310  NA 138 138  K 4.0 3.8  CL 103 101  CO2 24 26  GLUCOSE 171* 181*  BUN 19 24*  CREATININE 1.18 1.36*  CALCIUM 8.7* 8.6*   Liver Function Tests:  Recent Labs  08/01/15 1216  AST 23  ALT 20  ALKPHOS 90  BILITOT 0.6  PROT 6.7  ALBUMIN 3.4*   No results for input(s): LIPASE, AMYLASE in the last  72 hours. CBC:  Recent Labs  08/02/15 0229 08/03/15 0310  WBC 9.7 9.2  HGB 11.9* 12.2*  HCT 36.7* 37.9*  MCV 92.0 92.4  PLT 230 218   Cardiac Enzymes:  Recent Labs  08/01/15 1404 08/01/15 1955 08/02/15 0229  TROPONINI 0.18* 4.64* 6.59*   BNP: Invalid input(s): POCBNP D-Dimer: No results for input(s): DDIMER in the last 72 hours. Hemoglobin A1C: No results for input(s): HGBA1C in the last 72 hours. Fasting Lipid Panel: No results for input(s): CHOL, HDL, LDLCALC, TRIG, CHOLHDL, LDLDIRECT in the last 72 hours. Thyroid Function Tests: No results for input(s): TSH, T4TOTAL, T3FREE, THYROIDAB in the last 72 hours.  Invalid input(s): FREET3 Anemia Panel: No results for input(s): VITAMINB12, FOLATE, FERRITIN, TIBC, IRON, RETICCTPCT in the last 72 hours.  RADIOLOGY: Dg Chest Port 1 View  08/01/2015  CLINICAL DATA:  75 year old male with a history of chest pain and STEMI EXAM: PORTABLE CHEST 1 VIEW COMPARISON:  12/24/2011 FINDINGS: Cardiomediastinal silhouette is unchanged, though distorted with the apical lordotic positioning. Cardiac pacing device/ defibrillator on the left chest wall is unchanged. Surgical changes of median sternotomy  and CABG. Low lung volumes with interstitial opacities. No pneumothorax or large pleural effusion. No large confluent airspace disease. IMPRESSION: Low lung volumes with likely atelectasis and chronic lung changes with no evidence of pneumonia or significant edema. Surgical changes in median sternotomy and CABG. Unchanged left chest wall AICD. Signed, Dulcy Fanny. [name], DO [facility] Radiology Electronically Signed   By: Corrie Mckusick D.O.   On: 08/01/2015 13:17    PHYSICAL EXAM General: NAD Neck: JVP 8 cm, no thyromegaly or thyroid nodule.  Lungs: Clear to auscultation bilaterally with normal respiratory effort. CV: Lateral PMI.  Heart regular S1/S2, no S3/S4, no murmur.  No peripheral edema.     Abdomen: Soft, nontender, no hepatosplenomegaly, no distention.  Neurologic: Alert and oriented x 3.  Psych: Normal affect. Extremities: No clubbing or cyanosis.   TELEMETRY: Reviewed telemetry pt in NSR  ASSESSMENT AND PLAN: 75 yo with history of CAD s/p CABG and redo CABG as well as ischemic CMP and Medtronic CRT-D presents with NSTEMI and acute on chronic systolic CHF.  1. Acute on chronic systolic CHF: EF [id number] on echo this admission. He has a Medtronic CRT-D device. He diuresed with IV Lasix yesterday, volume status looks better today. - Hold Lasix today pending coronary angiography.  - Continue current eplerenone and Coreg.  - BP on the soft side, I decreased his Entresto dose.  2. CAD: s/p CABG and redo. 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 Has had angina chronically. Ranolazine and Imdur have helped with exertional chest pain. This admission, he had prolonged chest pain and NSTEMI with troponin to 6.59.  He has all SVGs and RCA known to be occluded.  LIMA patent on last cath, suspect still patent as not massive MI.  In past, he had 80% calcified distal left main => this was considered for PCI before and thought to be too high risk. I reviewed old films with Drs Ellyn Hack and Martinique yesterday, will take for coronary angiography today and consider intervention given new event.  - Continue ASA 81, Plavix, statin.  - Continue Imdur and Coreg. - Ranolazine increased to 1000 mg bid.  - Continue IV heparin gtt.   - Coronary angiography today.  3. CKD: Stage III.  Creatinine is near his baseline.  I will hold Lasix today pending angiography, volume status looks better than yesterday.  [name] 08/03/2015 8:34 AM

## 2015-08-04 ENCOUNTER — Encounter (HOSPITAL_COMMUNITY): Payer: Self-pay | Admitting: Cardiology

## 2015-08-04 DIAGNOSIS — N179 Acute kidney failure, unspecified: Secondary | ICD-10-CM

## 2015-08-04 LAB — GLUCOSE, CAPILLARY
GLUCOSE-CAPILLARY: 146 mg/dL — AB (ref 65–99)
Glucose-Capillary: 191 mg/dL — ABNORMAL HIGH (ref 65–99)
Glucose-Capillary: 171 mg/dL — ABNORMAL HIGH (ref 65–99)
Glucose-Capillary: 127 mg/dL — ABNORMAL HIGH (ref 65–99)

## 2015-08-04 LAB — CBC
HCT: 36.8 % — ABNORMAL LOW (ref 39.0–52.0)
Hemoglobin: 11.8 g/dL — ABNORMAL LOW (ref 13.0–17.0)
MCH: 29.6 pg (ref 26.0–34.0)
MCHC: 32.1 g/dL (ref 30.0–36.0)
MCV: 92.2 fL (ref 78.0–100.0)
PLATELETS: 230 10*3/uL (ref 150–400)
RBC: 3.99 MIL/uL — AB (ref 4.22–5.81)
RDW: 15 % (ref 11.5–15.5)
WBC: 13 10*3/uL — ABNORMAL HIGH (ref 4.0–10.5)

## 2015-08-04 LAB — BASIC METABOLIC PANEL
Anion gap: 13 (ref 5–15)
BUN: 38 mg/dL — AB (ref 6–20)
CALCIUM: 8.2 mg/dL — AB (ref 8.9–10.3)
CO2: 24 mmol/L (ref 22–32)
CREATININE: 2.29 mg/dL — AB (ref 0.61–1.24)
Chloride: 97 mmol/L — ABNORMAL LOW (ref 101–111)
GFR calc Af Amer: 31 mL/min — ABNORMAL LOW (ref >60)
GFR, EST NON AFRICAN AMERICAN: 27 mL/min — AB (ref >60)
Glucose, Bld: 170 mg/dL — ABNORMAL HIGH (ref 65–99)
POTASSIUM: 3.7 mmol/L (ref 3.5–5.1)
SODIUM: 134 mmol/L — AB (ref 135–145)

## 2015-08-04 LAB — HEPARIN LEVEL (UNFRACTIONATED): Heparin Unfractionated: 0.18 IU/mL — ABNORMAL LOW (ref 0.30–0.70)

## 2015-08-04 LAB — BRAIN NATRIURETIC PEPTIDE: B NATRIURETIC PEPTIDE 5: 108 pg/mL — AB (ref 0.0–100.0)

## 2015-08-04 MED ORDER — CARVEDILOL 3.125 MG PO TABS
3.1250 mg | ORAL_TABLET | Freq: Two times a day (BID) | ORAL | Status: DC
  Administered 2015-08-04: 3.125 mg via ORAL
  Filled 2015-08-04: qty 1

## 2015-08-04 MED FILL — Nitroglycerin IV Soln 100 MCG/ML in D5W: INTRA_ARTERIAL | Qty: 10 | Status: AC

## 2015-08-04 NOTE — Progress Notes (Signed)
Patient ID: [name], male   DOB: 1940/11/21, 75 y.o.   MRN: [id number]   SUBJECTIVE: No further chest pain today.  No dyspnea at rest.  Had cath yesterday, confusion overnight and relative hypotension with SBP down to 85 or so.  All BP-active medications held.  Off Lasix now.  Creatinine up to 2.29.   He fell on his right knee prior to admission.  It has developed increased effusion in hospital and is painful.   LHC (4/18):  85% distal LM with 90% ostial/proximal LCx.  LIMA patent.  Proximal LAD TO, proximal RCA TO, all SVGs occluded.   ECG: NSR, BiV paced  Scheduled Meds: . allopurinol  100 mg Oral BID  . aspirin EC  81 mg Oral Daily  . atorvastatin  80 mg Oral q1800  . carvedilol  3.125 mg Oral BID WC  . clopidogrel  75 mg Oral Daily  . insulin aspart  0-9 Units Subcutaneous TID WC  . pantoprazole  40 mg Oral Daily  . ranolazine  1,000 mg Oral BID  . sodium chloride flush  3 mL Intravenous Q12H  . sodium chloride flush  3 mL Intravenous Q12H  . venlafaxine XR  150 mg Oral QHS   Continuous Infusions: . sodium chloride Stopped (08/03/15 1750)   PRN Meds:.sodium chloride, sodium chloride, acetaminophen, ALPRAZolam, morphine injection, nitroGLYCERIN, ondansetron (ZOFRAN) IV, sodium chloride flush, sodium chloride flush, zolpidem    Filed Vitals:   08/04/15 0130 08/04/15 0203 08/04/15 0204 08/04/15 0459  BP:    85/50  Pulse:  86 76   Temp:    97.8 F (36.6 C)  TempSrc:    Oral  Resp: 18 18 19 17   Height:      Weight:    207 lb (93.895 kg)  SpO2:  88% 100% 98%    Intake/Output Summary (Last 24 hours) at 08/04/15 0829 Last data filed at 08/04/15 0700  Gross per 24 hour  Intake 2365.32 ml  Output    600 ml  Net 1765.32 ml    LABS: Basic Metabolic Panel:  Recent Labs  08/03/15 0310 08/04/15 0249  NA 138 134*  K 3.8 3.7  CL 101 97*  CO2 26 24  GLUCOSE 181* 170*  BUN 24* 38*  CREATININE 1.36* 2.29*  CALCIUM 8.6* 8.2*   Liver Function Tests:  Recent  Labs  08/01/15 1216  AST 23  ALT 20  ALKPHOS 90  BILITOT 0.6  PROT 6.7  ALBUMIN 3.4*   No results for input(s): LIPASE, AMYLASE in the last 72 hours. CBC:  Recent Labs  08/03/15 0310 08/04/15 0249  WBC 9.2 13.0*  HGB 12.2* 11.8*  HCT 37.9* 36.8*  MCV 92.4 92.2  PLT 218 230   Cardiac Enzymes:  Recent Labs  08/01/15 1404 08/01/15 1955 08/02/15 0229  TROPONINI 0.18* 4.64* 6.59*   BNP: Invalid input(s): POCBNP D-Dimer: No results for input(s): DDIMER in the last 72 hours. Hemoglobin A1C: No results for input(s): HGBA1C in the last 72 hours. Fasting Lipid Panel: No results for input(s): CHOL, HDL, LDLCALC, TRIG, CHOLHDL, LDLDIRECT in the last 72 hours. Thyroid Function Tests: No results for input(s): TSH, T4TOTAL, T3FREE, THYROIDAB in the last 72 hours.  Invalid input(s): FREET3 Anemia Panel: No results for input(s): VITAMINB12, FOLATE, FERRITIN, TIBC, IRON, RETICCTPCT in the last 72 hours.  RADIOLOGY: Dg Chest Port 1 View  08/01/2015  CLINICAL DATA:  75 year old male with a history of chest pain and STEMI EXAM: PORTABLE CHEST 1 VIEW COMPARISON:  12/24/2011 FINDINGS: Cardiomediastinal silhouette is unchanged, though distorted with the apical lordotic positioning. Cardiac pacing device/ defibrillator on the left chest wall is unchanged. Surgical changes of median sternotomy and CABG. Low lung volumes with interstitial opacities. No pneumothorax or large pleural effusion. No large confluent airspace disease. IMPRESSION: Low lung volumes with likely atelectasis and chronic lung changes with no evidence of pneumonia or significant edema. Surgical changes in median sternotomy and CABG. Unchanged left chest wall AICD. Signed, Dulcy Fanny. [name], DO [facility] Radiology Electronically Signed   By: Corrie Mckusick D.O.   On: 08/01/2015 13:17    PHYSICAL EXAM General: NAD Neck: JVP 10 cm, no thyromegaly or thyroid nodule.  Lungs:  Clear to auscultation bilaterally with normal respiratory effort. CV: Lateral PMI.  Heart regular S1/S2, no S3/S4, no murmur.  No peripheral edema.   Abdomen: Soft, nontender, no hepatosplenomegaly, no distention.  Neurologic: Alert and oriented x 3.  Psych: Normal affect. Extremities: No clubbing or cyanosis.  MSK: Right knee effusion/warmth.   TELEMETRY: Reviewed telemetry pt in NSR  ASSESSMENT AND PLAN: 75 yo with history of CAD s/p CABG and redo CABG as well as ischemic CMP and Medtronic CRT-D presents with NSTEMI and acute on chronic systolic CHF.  1. Acute on chronic systolic CHF: EF 123XX123 on echo this admission. He has a Medtronic CRT-D device. Lasix has been held prior to cath and now with elevated creatinine. JVP is elevated this am but not short of breath. - Continue to hold Lasix today with rise in creatinine, will need eventual restart. [name] and eplerenone stopped with low BP, rise in creatinine.  - Will decrease Coreg to 3.125 mg bid.   2. CAD: s/p CABG and redo. 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 Has had angina chronically. Ranolazine and Imdur have helped with exertional chest pain. This admission, he had prolonged chest pain and NSTEMI with troponin to 6.59.  He has all SVGs and RCA known to be occluded.  LIMA patent.  He has 85% distal LM stenosis with acute angle LCx take-off and 90% ostial/proximal LCx stenosis.  No further chest pain.  - Intervention on the LM/LCx would be complex and high risk.  Had tentatively planned on PCI with Impella support tomorrow, but this is complicated by rise in creatinine.  Will keep NPO at midnight but suspect we may need to delay this until he is more stable.  - Continue ASA 81, Plavix, statin.  - Continue Coreg but at low dose with low BP.  - Ranolazine increased to 1000 mg bid.  - He has been on heparin gtt for > 48 hrs.  Will hold at this point with no further chest pain and  worsening knee effusion (worried for possible hemarthrosis).   3. AKI on stage III CKD: Suspect related to hypotension and contrast.  BP-active meds and Lasix held.  Repeat BMET in am.  4. Knee pain: Has right knee pain and effusion. Will hold heparin at this point given concern for possible development of hemarthrosis with worsening of effusion.   [name] 08/04/2015 8:29 AM

## 2015-08-05 ENCOUNTER — Ambulatory Visit: Payer: Managed Care, Other (non HMO) | Admitting: Endocrinology

## 2015-08-05 LAB — BASIC METABOLIC PANEL
ANION GAP: 13 (ref 5–15)
Anion gap: 12 (ref 5–15)
BUN: 35 mg/dL — AB (ref 6–20)
BUN: 33 mg/dL — AB (ref 6–20)
CALCIUM: 7.8 mg/dL — AB (ref 8.9–10.3)
CHLORIDE: 103 mmol/L (ref 101–111)
CO2: 21 mmol/L — ABNORMAL LOW (ref 22–32)
CO2: 21 mmol/L — AB (ref 22–32)
CREATININE: 1.63 mg/dL — AB (ref 0.61–1.24)
Calcium: 8.3 mg/dL — ABNORMAL LOW (ref 8.9–10.3)
Chloride: 99 mmol/L — ABNORMAL LOW (ref 101–111)
Creatinine, Ser: 1.73 mg/dL — ABNORMAL HIGH (ref 0.61–1.24)
GFR calc Af Amer: 44 mL/min — ABNORMAL LOW (ref >60)
GFR calc Af Amer: 47 mL/min — ABNORMAL LOW (ref >60)
GFR calc non Af Amer: 40 mL/min — ABNORMAL LOW (ref >60)
GFR, EST NON AFRICAN AMERICAN: 38 mL/min — AB (ref >60)
GLUCOSE: 122 mg/dL — AB (ref 65–99)
Glucose, Bld: 175 mg/dL — ABNORMAL HIGH (ref 65–99)
Potassium: 2.9 mmol/L — ABNORMAL LOW (ref 3.5–5.1)
Potassium: 3.4 mmol/L — ABNORMAL LOW (ref 3.5–5.1)
Sodium: 133 mmol/L — ABNORMAL LOW (ref 135–145)
Sodium: 136 mmol/L (ref 135–145)

## 2015-08-05 LAB — CBC
HCT: 32.5 % — ABNORMAL LOW (ref 39.0–52.0)
HEMATOCRIT: 34.7 % — AB (ref 39.0–52.0)
HEMOGLOBIN: 11 g/dL — AB (ref 13.0–17.0)
HEMOGLOBIN: 11.6 g/dL — AB (ref 13.0–17.0)
MCH: 30.6 pg (ref 26.0–34.0)
MCH: 30.6 pg (ref 26.0–34.0)
MCHC: 33.8 g/dL (ref 30.0–36.0)
MCHC: 33.4 g/dL (ref 30.0–36.0)
MCV: 90.3 fL (ref 78.0–100.0)
MCV: 91.6 fL (ref 78.0–100.0)
PLATELETS: 187 10*3/uL (ref 150–400)
Platelets: 188 10*3/uL (ref 150–400)
RBC: 3.6 MIL/uL — ABNORMAL LOW (ref 4.22–5.81)
RBC: 3.79 MIL/uL — ABNORMAL LOW (ref 4.22–5.81)
RDW: 14.8 % (ref 11.5–15.5)
RDW: 15 % (ref 11.5–15.5)
WBC: 14 10*3/uL — ABNORMAL HIGH (ref 4.0–10.5)
WBC: 11 10*3/uL — ABNORMAL HIGH (ref 4.0–10.5)

## 2015-08-05 LAB — PROTIME-INR
INR: 1.39 (ref 0.00–1.49)
Prothrombin Time: 17.2 seconds — ABNORMAL HIGH (ref 11.6–15.2)

## 2015-08-05 LAB — GLUCOSE, CAPILLARY
GLUCOSE-CAPILLARY: 121 mg/dL — AB (ref 65–99)
Glucose-Capillary: 184 mg/dL — ABNORMAL HIGH (ref 65–99)
Glucose-Capillary: 173 mg/dL — ABNORMAL HIGH (ref 65–99)
Glucose-Capillary: 302 mg/dL — ABNORMAL HIGH (ref 65–99)

## 2015-08-05 MED ORDER — SODIUM CHLORIDE 0.9% FLUSH: 3.0000 mL | INTRAVENOUS | Status: DC | PRN

## 2015-08-05 MED ORDER — CARVEDILOL 6.25 MG PO TABS
6.2500 mg | ORAL_TABLET | Freq: Two times a day (BID) | ORAL | Status: DC
  Administered 2015-08-05: 6.25 mg via ORAL
  Filled 2015-08-05: qty 1

## 2015-08-05 MED ORDER — SODIUM CHLORIDE 0.9 % IV SOLN: INTRAVENOUS | Status: DC

## 2015-08-05 MED ORDER — NON FORMULARY: 25.0000 mg | Freq: Every day | Status: DC

## 2015-08-05 MED ORDER — SODIUM CHLORIDE 0.9 % IV SOLN: 250.0000 mL | INTRAVENOUS | Status: DC | PRN

## 2015-08-05 MED ORDER — ASPIRIN 81 MG PO CHEW
81.0000 mg | CHEWABLE_TABLET | ORAL | Status: AC
  Administered 2015-08-06: 81 mg via ORAL
  Filled 2015-08-05: qty 1

## 2015-08-05 MED ORDER — HEPARIN SODIUM (PORCINE) 5000 UNIT/ML IJ SOLN
5000.0000 [IU] | Freq: Three times a day (TID) | INTRAMUSCULAR | Status: DC
  Administered 2015-08-05 – 2015-08-08 (×8): 5000 [IU] via SUBCUTANEOUS
  Filled 2015-08-05 (×8): qty 1

## 2015-08-05 MED ORDER — SODIUM CHLORIDE 0.9% FLUSH: 3.0000 mL | Freq: Two times a day (BID) | INTRAVENOUS | Status: DC

## 2015-08-05 MED ORDER — EPLERENONE 25 MG PO TABS
25.0000 mg | ORAL_TABLET | Freq: Every day | ORAL | Status: DC
  Administered 2015-08-05 – 2015-08-08 (×4): 25 mg via ORAL
  Filled 2015-08-05 (×5): qty 1

## 2015-08-05 MED ORDER — ASPIRIN 81 MG PO CHEW: 81.0000 mg | CHEWABLE_TABLET | ORAL | Status: DC

## 2015-08-05 MED ORDER — SODIUM CHLORIDE 0.9 % IV SOLN
INTRAVENOUS | Status: DC
  Administered 2015-08-06: 01:00:00 via INTRAVENOUS

## 2015-08-05 MED ORDER — SODIUM CHLORIDE 0.9% FLUSH
3.0000 mL | Freq: Two times a day (BID) | INTRAVENOUS | Status: DC
  Administered 2015-08-05 – 2015-08-06 (×3): 3 mL via INTRAVENOUS

## 2015-08-05 MED ORDER — POTASSIUM CHLORIDE CRYS ER 20 MEQ PO TBCR
40.0000 meq | EXTENDED_RELEASE_TABLET | Freq: Once | ORAL | Status: AC
  Administered 2015-08-05: 40 meq via ORAL
  Filled 2015-08-05: qty 2

## 2015-08-05 NOTE — Progress Notes (Signed)
Patient ID: [name], male   DOB: February 21, 1941, 75 y.o.   MRN: [id number]   SUBJECTIVE: No further chest pain.  No dyspnea at rest.  BP better today and creatinine down to 1.7 with holding BP-active meds and Lasix.    He fell on his right knee prior to admission.  Right knee pain is better and effusion seems smaller.   Wife says that he is still having some confusion overnight.   LHC (4/18):  85% distal LM with 90% ostial/proximal LCx.  LIMA patent.  Proximal LAD TO, proximal RCA TO, all SVGs occluded.   ECG: NSR, BiV paced  Scheduled Meds: . allopurinol  100 mg Oral BID  . aspirin EC  81 mg Oral Daily  . atorvastatin  80 mg Oral q1800  . carvedilol  3.125 mg Oral BID WC  . clopidogrel  75 mg Oral Daily  . eplerenone  25 mg Oral Daily  . insulin aspart  0-9 Units Subcutaneous TID WC  . pantoprazole  40 mg Oral Daily  . potassium chloride  40 mEq Oral Once  . ranolazine  1,000 mg Oral BID  . sodium chloride flush  3 mL Intravenous Q12H  . sodium chloride flush  3 mL Intravenous Q12H  . venlafaxine XR  150 mg Oral QHS   Continuous Infusions: . sodium chloride Stopped (08/03/15 1750)   PRN Meds:.sodium chloride, sodium chloride, acetaminophen, ALPRAZolam, morphine injection, nitroGLYCERIN, ondansetron (ZOFRAN) IV, sodium chloride flush, sodium chloride flush, zolpidem    Filed Vitals:   08/05/15 0200 08/05/15 0300 08/05/15 0400 08/05/15 0500  BP: 109/60 105/53 103/57 111/62  Pulse: 77 71 73 73  Temp:  97.9 F (36.6 C)    TempSrc:  Oral    Resp: 19 16 18 17   Height:      Weight:  208 lb 12.8 oz (94.711 kg)    SpO2: 98% 98% 96% 99%    Intake/Output Summary (Last 24 hours) at 08/05/15 0850 Last data filed at 08/05/15 0056  Gross per 24 hour  Intake      0 ml  Output    700 ml  Net   -700 ml    LABS: Basic Metabolic Panel:  Recent Labs  08/04/15 0249 08/05/15 0244  NA 134* 133*  K 3.7 2.9*  CL 97* 99*  CO2 24 21*  GLUCOSE 170* 122*  BUN 38* 35*    CREATININE 2.29* 1.73*  CALCIUM 8.2* 7.8*   Liver Function Tests: No results for input(s): AST, ALT, ALKPHOS, BILITOT, PROT, ALBUMIN in the last 72 hours. No results for input(s): LIPASE, AMYLASE in the last 72 hours. CBC:  Recent Labs  08/04/15 0249 08/05/15 0244  WBC 13.0* 14.0*  HGB 11.8* 11.0*  HCT 36.8* 32.5*  MCV 92.2 90.3  PLT 230 187   Cardiac Enzymes: No results for input(s): CKTOTAL, CKMB, CKMBINDEX, TROPONINI in the last 72 hours. BNP: Invalid input(s): POCBNP D-Dimer: No results for input(s): DDIMER in the last 72 hours. Hemoglobin A1C: No results for input(s): HGBA1C in the last 72 hours. Fasting Lipid Panel: No results for input(s): CHOL, HDL, LDLCALC, TRIG, CHOLHDL, LDLDIRECT in the last 72 hours. Thyroid Function Tests: No results for input(s): TSH, T4TOTAL, T3FREE, THYROIDAB in the last 72 hours.  Invalid input(s): FREET3 Anemia Panel: No results for input(s): VITAMINB12, FOLATE, FERRITIN, TIBC, IRON, RETICCTPCT in the last 72 hours.  RADIOLOGY: Dg Chest Port 1 View  08/01/2015  CLINICAL DATA:  75 year old male with a history of chest pain  and STEMI EXAM: PORTABLE CHEST 1 VIEW COMPARISON:  12/24/2011 FINDINGS: Cardiomediastinal silhouette is unchanged, though distorted with the apical lordotic positioning. Cardiac pacing device/ defibrillator on the left chest wall is unchanged. Surgical changes of median sternotomy and CABG. Low lung volumes with interstitial opacities. No pneumothorax or large pleural effusion. No large confluent airspace disease. IMPRESSION: Low lung volumes with likely atelectasis and chronic lung changes with no evidence of pneumonia or significant edema. Surgical changes in median sternotomy and CABG. Unchanged left chest wall AICD. Signed, Dulcy Fanny. [name], DO [facility] Radiology Electronically Signed   By: Corrie Mckusick D.O.   On: 08/01/2015 13:17    PHYSICAL EXAM General: NAD Neck:  JVP 8-9 cm, no thyromegaly or thyroid nodule.  Lungs: Clear to auscultation bilaterally with normal respiratory effort. CV: Lateral PMI.  Heart regular S1/S2, no S3/S4, no murmur.  No peripheral edema.   Abdomen: Soft, nontender, no hepatosplenomegaly, no distention.  Neurologic: Alert and oriented x 3.  Psych: Normal affect. Extremities: No clubbing or cyanosis.  MSK: Right knee effusion/warmth.   TELEMETRY: Reviewed telemetry pt in NSR  ASSESSMENT AND PLAN: 75 yo with history of CAD s/p CABG and redo CABG as well as ischemic CMP and Medtronic CRT-D presents with NSTEMI and acute on chronic systolic CHF.  1. Acute on chronic systolic CHF: EF 123XX123 on echo this admission. He has a Medtronic CRT-D device. Lasix has been held prior to cath and now with elevated creatinine. JVP remains mildly elevated but not short of breath and weight relatively stable. - Continue to hold Lasix today with rise in creatinine and plan for PCI tomorrow. [name] stopped with low BP, rise in creatinine.  - Can increase Coreg back to 6.25 mg daily. - Can restart eplerenone 25 daily.   2. CAD: s/p CABG and redo. 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 Has had angina chronically. Ranolazine and Imdur have helped with exertional chest pain. This admission, he had prolonged chest pain and NSTEMI with troponin to 6.59.  He has all SVGs and RCA known to be occluded.  LIMA patent.  He has 85% distal LM stenosis with acute angle LCx take-off and 90% ostial/proximal LCx stenosis.  No further chest pain.  - Intervention on the LM/LCx will be complex and high risk.  Creatinine now coming down, 1.7 today.  Baseline around 1.4.  Will tentatively plan PCI with Impella support tomorrow with Dr Irish Lack.  - Continue ASA 81, Plavix, statin.  - Continue Coreg but at lower dose with low BP.  - Ranolazine increased to 1000 mg bid.  - Now off heparin gtt, needs DVT prophylaxis (will  start).   3. AKI on stage III CKD: Suspect related to hypotension and contrast.  BP-active meds and Lasix held. Creatinine improving, down to 1.7.   [name] 08/05/2015 8:50 AM

## 2015-08-06 ENCOUNTER — Encounter (HOSPITAL_COMMUNITY)
Admission: EM | Disposition: A | Discharge status: Home / Self Care | Payer: Self-pay | Source: Home / Self Care | Attending: Internal Medicine

## 2015-08-06 DIAGNOSIS — I214 Non-ST elevation (NSTEMI) myocardial infarction: Secondary | ICD-10-CM | POA: Insufficient documentation

## 2015-08-06 DIAGNOSIS — I5022 Chronic systolic (congestive) heart failure: Secondary | ICD-10-CM

## 2015-08-06 HISTORY — PX: CARDIAC CATHETERIZATION: SHX172

## 2015-08-06 LAB — PREPARE RBC (CROSSMATCH)

## 2015-08-06 LAB — GLUCOSE, CAPILLARY
GLUCOSE-CAPILLARY: 151 mg/dL — AB (ref 65–99)
GLUCOSE-CAPILLARY: 134 mg/dL — AB (ref 65–99)
Glucose-Capillary: 148 mg/dL — ABNORMAL HIGH (ref 65–99)
Glucose-Capillary: 137 mg/dL — ABNORMAL HIGH (ref 65–99)

## 2015-08-06 LAB — BASIC METABOLIC PANEL
ANION GAP: 11 (ref 5–15)
BUN: 29 mg/dL — ABNORMAL HIGH (ref 6–20)
CALCIUM: 8.2 mg/dL — AB (ref 8.9–10.3)
CO2: 22 mmol/L (ref 22–32)
Chloride: 101 mmol/L (ref 101–111)
Creatinine, Ser: 1.38 mg/dL — ABNORMAL HIGH (ref 0.61–1.24)
GFR, EST AFRICAN AMERICAN: 57 mL/min — AB (ref >60)
GFR, EST NON AFRICAN AMERICAN: 50 mL/min — AB (ref >60)
GLUCOSE: 182 mg/dL — AB (ref 65–99)
Potassium: 3.6 mmol/L (ref 3.5–5.1)
SODIUM: 134 mmol/L — AB (ref 135–145)

## 2015-08-06 LAB — CBC
HEMATOCRIT: 34.5 % — AB (ref 39.0–52.0)
Hemoglobin: 11.4 g/dL — ABNORMAL LOW (ref 13.0–17.0)
MCH: 29.8 pg (ref 26.0–34.0)
MCHC: 33 g/dL (ref 30.0–36.0)
MCV: 90.3 fL (ref 78.0–100.0)
PLATELETS: 219 10*3/uL (ref 150–400)
RBC: 3.82 MIL/uL — AB (ref 4.22–5.81)
RDW: 15.1 % (ref 11.5–15.5)
WBC: 9.7 10*3/uL (ref 4.0–10.5)

## 2015-08-06 LAB — POCT ACTIVATED CLOTTING TIME: ACTIVATED CLOTTING TIME: 353 s

## 2015-08-06 SURGERY — CORONARY STENT INTERVENTION W/IMPELLA
Anesthesia: LOCAL

## 2015-08-06 MED ORDER — ONDANSETRON HCL 4 MG/2ML IJ SOLN: 4.0000 mg | Freq: Four times a day (QID) | INTRAMUSCULAR | Status: DC | PRN

## 2015-08-06 MED ORDER — ACETAMINOPHEN 325 MG PO TABS
650.0000 mg | ORAL_TABLET | ORAL | Status: DC | PRN
  Administered 2015-08-06 – 2015-08-07 (×2): 650 mg via ORAL
  Filled 2015-08-06 (×2): qty 2

## 2015-08-06 MED ORDER — SODIUM CHLORIDE 0.9% FLUSH
3.0000 mL | Freq: Two times a day (BID) | INTRAVENOUS | Status: DC
  Administered 2015-08-06 – 2015-08-08 (×3): 3 mL via INTRAVENOUS

## 2015-08-06 MED ORDER — FENTANYL CITRATE (PF) 100 MCG/2ML IJ SOLN
INTRAMUSCULAR | Status: AC
  Filled 2015-08-06: qty 2

## 2015-08-06 MED ORDER — SODIUM CHLORIDE 0.9 % WEIGHT BASED INFUSION
1.0000 mL/kg/h | INTRAVENOUS | Status: AC
  Administered 2015-08-06: 1 mL/kg/h via INTRAVENOUS

## 2015-08-06 MED ORDER — CARVEDILOL 6.25 MG PO TABS
9.3750 mg | ORAL_TABLET | Freq: Two times a day (BID) | ORAL | Status: DC
  Administered 2015-08-06 – 2015-08-08 (×4): 9.375 mg via ORAL
  Filled 2015-08-06 (×4): qty 1

## 2015-08-06 MED ORDER — SODIUM CHLORIDE 0.9% FLUSH
3.0000 mL | INTRAVENOUS | Status: DC | PRN
Start: 2015-08-06 — End: 2015-08-08

## 2015-08-06 MED ORDER — MIDAZOLAM HCL 2 MG/2ML IJ SOLN
INTRAMUSCULAR | Status: AC
  Filled 2015-08-06: qty 2

## 2015-08-06 MED ORDER — IOPAMIDOL (ISOVUE-370) INJECTION 76%
INTRAVENOUS | Status: AC
  Filled 2015-08-06: qty 50

## 2015-08-06 MED ORDER — BIVALIRUDIN 250 MG IV SOLR
INTRAVENOUS | Status: AC
  Filled 2015-08-06: qty 250

## 2015-08-06 MED ORDER — BIVALIRUDIN BOLUS VIA INFUSION - CUPID
INTRAVENOUS | Status: DC | PRN
  Administered 2015-08-06: 70.5 mg via INTRAVENOUS

## 2015-08-06 MED ORDER — ISOSORBIDE MONONITRATE ER 30 MG PO TB24
30.0000 mg | ORAL_TABLET | Freq: Every day | ORAL | Status: DC
  Administered 2015-08-06 – 2015-08-08 (×3): 30 mg via ORAL
  Filled 2015-08-06 (×3): qty 1

## 2015-08-06 MED ORDER — LIDOCAINE HCL (PF) 1 % IJ SOLN
INTRAMUSCULAR | Status: AC
  Filled 2015-08-06: qty 60

## 2015-08-06 MED ORDER — NITROGLYCERIN 1 MG/10 ML FOR IR/CATH LAB
INTRA_ARTERIAL | Status: AC
  Filled 2015-08-06: qty 10

## 2015-08-06 MED ORDER — IOPAMIDOL (ISOVUE-370) INJECTION 76%
INTRAVENOUS | Status: AC
  Filled 2015-08-06: qty 250

## 2015-08-06 MED ORDER — VIPERSLIDE LUBRICANT OPTIME
TOPICAL | Status: DC | PRN
  Administered 2015-08-06: 20 mL via SURGICAL_CAVITY

## 2015-08-06 MED ORDER — IOPAMIDOL (ISOVUE-370) INJECTION 76%
INTRAVENOUS | Status: DC | PRN
  Administered 2015-08-06: 125 mL via INTRAVENOUS

## 2015-08-06 MED ORDER — SODIUM CHLORIDE 0.9 % IV SOLN
250.0000 mg | INTRAVENOUS | Status: DC | PRN
  Administered 2015-08-06: 250 mg
  Administered 2015-08-06: 1.75 mg/kg/h via INTRAVENOUS

## 2015-08-06 MED ORDER — CLOPIDOGREL BISULFATE 75 MG PO TABS
75.0000 mg | ORAL_TABLET | Freq: Every day | ORAL | Status: DC
  Administered 2015-08-07 – 2015-08-08 (×2): 75 mg via ORAL
  Filled 2015-08-06 (×2): qty 1

## 2015-08-06 MED ORDER — LIDOCAINE HCL (PF) 1 % IJ SOLN
INTRAMUSCULAR | Status: DC | PRN
  Administered 2015-08-06: 30 mL via INTRADERMAL

## 2015-08-06 MED ORDER — FENTANYL CITRATE (PF) 100 MCG/2ML IJ SOLN
INTRAMUSCULAR | Status: DC | PRN
  Administered 2015-08-06 (×2): 25 ug via INTRAVENOUS

## 2015-08-06 MED ORDER — MIDAZOLAM HCL 2 MG/2ML IJ SOLN
INTRAMUSCULAR | Status: DC | PRN
  Administered 2015-08-06 (×2): 1 mg via INTRAVENOUS

## 2015-08-06 MED ORDER — SODIUM CHLORIDE 0.9 % IV SOLN: 250.0000 mL | INTRAVENOUS | Status: DC | PRN

## 2015-08-06 MED ORDER — HEPARIN (PORCINE) IN NACL 2-0.9 UNIT/ML-% IJ SOLN
INTRAMUSCULAR | Status: DC | PRN
  Administered 2015-08-06: 1500 mL

## 2015-08-06 MED ORDER — ASPIRIN 81 MG PO CHEW
81.0000 mg | CHEWABLE_TABLET | Freq: Every day | ORAL | Status: DC
  Administered 2015-08-07 – 2015-08-08 (×2): 81 mg via ORAL
  Filled 2015-08-06 (×2): qty 1

## 2015-08-06 SURGICAL SUPPLY — 32 items
BALLN ANGIOSCULPT RX 2.0X15 (BALLOONS) ×1 IMPLANT
BALLN EUPHORA RX 2.5X20 (BALLOONS) ×3
BALLN SPRINTER MX OTW 1.5X12 (BALLOONS) ×3
BALLOON EUPHORA RX 2.5X20 (BALLOONS) IMPLANT
BALLOON SPRINTER MX OTW 1.5X12 (BALLOONS) IMPLANT
CATH INFINITI 5FR ANG PIGTAIL (CATHETERS) ×1 IMPLANT
CATH MACH 1 7FR CLS3.5 (CATHETERS) ×3 IMPLANT
CROWN DIAMONDBACK CLASSIC 1.25 (BURR) ×1 IMPLANT
ELECT DEFIB PAD ADLT CADENCE (PAD) ×1 IMPLANT
FEM STOP ARCH (HEMOSTASIS) ×1
KIT ENCORE 26 ADVANTAGE (KITS) ×3 IMPLANT
KIT HEART LEFT (KITS) ×3 IMPLANT
PACK CARDIAC CATHETERIZATION (CUSTOM PROCEDURE TRAY) ×3 IMPLANT
SET IMPELLA CP PUMP (CATHETERS) ×1 IMPLANT
SHEATH BRITE TIP 7FR 35CM (SHEATH) ×1 IMPLANT
SHEATH FAST CATH 14F (SHEATH) ×1 IMPLANT
SHEATH PINNACLE 6F 10CM (SHEATH) ×1 IMPLANT
SHEATH PINNACLE 7F 10CM (SHEATH) ×1 IMPLANT
STENT SYNERGY DES 2.5X24 (Permanent Stent) ×1 IMPLANT
STENT SYNERGY DES 4X12 (Permanent Stent) ×1 IMPLANT
SYSTEM COMPRESSION FEMOSTOP (HEMOSTASIS) IMPLANT
TRANSDUCER W/STOPCOCK (MISCELLANEOUS) ×3 IMPLANT
TUBING CIL FLEX 10 FLL-RA (TUBING) ×3 IMPLANT
VALVE GUARDIAN II ~~LOC~~ HEMO (MISCELLANEOUS) ×1 IMPLANT
WIRE ASAHI PROWATER 180CM (WIRE) ×2 IMPLANT
WIRE EMERALD 3MM-J .025X260CM (WIRE) ×1 IMPLANT
WIRE EMERALD 3MM-J .035X150CM (WIRE) ×1 IMPLANT
WIRE EMERALD 3MM-J .035X260CM (WIRE) ×1 IMPLANT
WIRE HI TORQ VERSACORE-J 145CM (WIRE) ×1 IMPLANT
WIRE MAILMAN 182CM (WIRE) ×1 IMPLANT
WIRE MAILMAN 300CM (WIRE) ×1 IMPLANT
WIRE VIPER ADVANCE COR .012TIP (WIRE) ×1 IMPLANT

## 2015-08-06 NOTE — Interval H&P Note (Signed)
Cath Lab Visit (complete for each Cath Lab visit)  Clinical Evaluation Leading to the Procedure:   ACS: Yes.    Non-ACS:    Anginal Classification: CCS IV  Anti-ischemic medical therapy: Maximal Therapy (2 or more classes of medications)  Non-Invasive Test Results: No non-invasive testing performed  Prior CABG: Previous CABG   High risk PCI with CSI and Impella support.  Risk explained to family.   History and Physical Interval Note:  08/06/2015 2:04 PM  [name]  has presented today for surgery, with the diagnosis of CAD  The various methods of treatment have been discussed with the patient and family. After consideration of risks, benefits and other options for treatment, the patient has consented to  Procedure(s): Coronary Stent Intervention w/Impella (N/A) as a surgical intervention .  The patient's history has been reviewed, patient examined, no change in status, stable for surgery.  I have reviewed the patient's chart and labs.  Questions were answered to the patient's satisfaction.     VARANASI,JAYADEEP S.

## 2015-08-06 NOTE — Progress Notes (Signed)
Inpatient Diabetes Program Recommendations  AACE/ADA: New Consensus Statement on Inpatient Glycemic Control (2015)  Target Ranges:  Prepandial:   less than 140 mg/dL      Peak postprandial:   less than 180 mg/dL (1-2 hours)      Critically ill patients:  140 - 180 mg/dL   Review of Glycemic Control  Diabetes history: DM Type 2 Outpatient Diabetes medications: 50/50 30 units QAM, 20 units QPM Current orders for Inpatient glycemic control: Novolog 0-9 units TID with meals  Inpatient Diabetes Program Recommendations: Noted patient is currently NPO for procedure.  Please consider adding Lantus 20 units basal and  increasing Novolog correction to moderate scale tid and adding Novolog BEDTIME correction scale.  Thank you, Nani Gasser. Savaughn Karwowski, RN, MSN, CDE Inpatient Glycemic Control Team Team Pager [phone] (8am-5pm) 08/06/2015 9:06 AM

## 2015-08-06 NOTE — Progress Notes (Signed)
Patient ID: [name], male   DOB: Aug 13, 1940, 75 y.o.   MRN: [id number]   SUBJECTIVE: No further chest pain.  No dyspnea at rest.  BP better today and creatinine down to 1.38 with holding BP-active meds and Lasix.  He walked in his room yesterday without problems.  He fell on his right knee prior to admission.  Right knee pain is better and effusion seems smaller.   No further confusion.  LHC (4/18):  85% distal LM with 90% ostial/proximal LCx.  LIMA patent.  Proximal LAD TO, proximal RCA TO, all SVGs occluded.   ECG: NSR, BiV paced  Scheduled Meds: . allopurinol  100 mg Oral BID  . aspirin EC  81 mg Oral Daily  . atorvastatin  80 mg Oral q1800  . carvedilol  9.375 mg Oral BID WC  . clopidogrel  75 mg Oral Daily  . eplerenone  25 mg Oral Daily  . heparin subcutaneous  5,000 Units Subcutaneous Q8H  . insulin aspart  0-9 Units Subcutaneous TID WC  . isosorbide mononitrate  30 mg Oral Daily  . pantoprazole  40 mg Oral Daily  . ranolazine  1,000 mg Oral BID  . sodium chloride flush  3 mL Intravenous Q12H  . sodium chloride flush  3 mL Intravenous Q12H  . sodium chloride flush  3 mL Intravenous Q12H  . venlafaxine XR  150 mg Oral QHS   Continuous Infusions: . sodium chloride Stopped (08/03/15 1750)  . sodium chloride 10 mL/hr at 08/06/15 0125   PRN Meds:.sodium chloride, sodium chloride, sodium chloride, acetaminophen, ALPRAZolam, morphine injection, nitroGLYCERIN, ondansetron (ZOFRAN) IV, sodium chloride flush, sodium chloride flush, sodium chloride flush, zolpidem    Filed Vitals:   08/06/15 0000 08/06/15 0400 08/06/15 0644 08/06/15 0757  BP: 102/60 104/58  113/64  Pulse: 73 75  73  Temp:  98 F (36.7 C)  97.8 F (36.6 C)  TempSrc:  Oral  Oral  Resp: 20 20  18   Height:      Weight:   207 lb 3.2 oz (93.985 kg)   SpO2: 96% 96%  97%    Intake/Output Summary (Last 24 hours) at 08/06/15 0834 Last data filed at 08/06/15 0400  Gross per 24 hour  Intake    750 ml    Output      0 ml  Net    750 ml    LABS: Basic Metabolic Panel:  Recent Labs  08/05/15 1505 08/06/15 0308  NA 136 134*  K 3.4* 3.6  CL 103 101  CO2 21* 22  GLUCOSE 175* 182*  BUN 33* 29*  CREATININE 1.63* 1.38*  CALCIUM 8.3* 8.2*   Liver Function Tests: No results for input(s): AST, ALT, ALKPHOS, BILITOT, PROT, ALBUMIN in the last 72 hours. No results for input(s): LIPASE, AMYLASE in the last 72 hours. CBC:  Recent Labs  08/05/15 1505 08/06/15 0308  WBC 11.0* 9.7  HGB 11.6* 11.4*  HCT 34.7* 34.5*  MCV 91.6 90.3  PLT 188 219   Cardiac Enzymes: No results for input(s): CKTOTAL, CKMB, CKMBINDEX, TROPONINI in the last 72 hours. BNP: Invalid input(s): POCBNP D-Dimer: No results for input(s): DDIMER in the last 72 hours. Hemoglobin A1C: No results for input(s): HGBA1C in the last 72 hours. Fasting Lipid Panel: No results for input(s): CHOL, HDL, LDLCALC, TRIG, CHOLHDL, LDLDIRECT in the last 72 hours. Thyroid Function Tests: No results for input(s): TSH, T4TOTAL, T3FREE, THYROIDAB in the last 72 hours.  Invalid input(s): FREET3 Anemia Panel:  No results for input(s): VITAMINB12, FOLATE, FERRITIN, TIBC, IRON, RETICCTPCT in the last 72 hours.  RADIOLOGY: Dg Chest Port 1 View  08/01/2015  CLINICAL DATA:  75 year old male with a history of chest pain and STEMI EXAM: PORTABLE CHEST 1 VIEW COMPARISON:  12/24/2011 FINDINGS: Cardiomediastinal silhouette is unchanged, though distorted with the apical lordotic positioning. Cardiac pacing device/ defibrillator on the left chest wall is unchanged. Surgical changes of median sternotomy and CABG. Low lung volumes with interstitial opacities. No pneumothorax or large pleural effusion. No large confluent airspace disease. IMPRESSION: Low lung volumes with likely atelectasis and chronic lung changes with no evidence of pneumonia or significant edema. Surgical changes in median sternotomy and CABG. Unchanged left chest wall AICD.  Signed, Dulcy Fanny. [name], DO [facility] Radiology Electronically Signed   By: Corrie Mckusick D.O.   On: 08/01/2015 13:17    PHYSICAL EXAM General: NAD Neck: JVP 8-9 cm, no thyromegaly or thyroid nodule.  Lungs: Clear to auscultation bilaterally with normal respiratory effort. CV: Lateral PMI.  Heart regular S1/S2, no S3/S4, no murmur.  No peripheral edema.   Abdomen: Soft, nontender, no hepatosplenomegaly, no distention.  Neurologic: Alert and oriented x 3.  Psych: Normal affect. Extremities: No clubbing or cyanosis.  MSK: Right knee effusion/warmth.   TELEMETRY: Reviewed telemetry pt in NSR  ASSESSMENT AND PLAN: 75 yo with history of CAD s/p CABG and redo CABG as well as ischemic CMP and Medtronic CRT-D presents with NSTEMI and acute on chronic systolic CHF.  1. Acute on chronic systolic CHF: EF 123XX123 on echo this admission. He has a Medtronic CRT-D device. Lasix has been held prior to cath and now with elevated creatinine. JVP remains mildly elevated but not short of breath and weight stable. - Continue to hold Lasix today with PCI.  - Entresto stopped with low BP, rise in creatinine. Will probably keep him off Entresto given BP issues and start low dose lisinopril after intervention.  - Can increase Coreg to 9.375 mg bid. - Continue eplerenone 25 daily.   2. CAD: s/p CABG and redo. 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 Has had angina chronically. Ranolazine and Imdur have helped with exertional chest pain. This admission, he had prolonged chest pain and NSTEMI with troponin to 6.59.  He has all SVGs and RCA known to be occluded.  LIMA patent.  He has 85% distal LM stenosis with acute angle LCx take-off and 90% ostial/proximal LCx stenosis.  No further chest pain.  - Intervention on the LM/LCx will be complex and high risk.  Creatinine now coming down, 1.38 today.  Baseline around 1.4.  Will plan  PCI with Impella support today with Dr Irish Lack.  - Continue ASA 81, Plavix, statin. Restart Imdur.  - Continue Coreg.  - Ranolazine increased to 1000 mg bid.  - Now off heparin gtt, on DVT prophylaxis dosing.   3. AKI on stage III CKD: Suspect related to hypotension and contrast.  BP-active meds and Lasix held. Creatinine improving, down to 1.38.   [name] 08/06/2015 8:34 AM

## 2015-08-06 NOTE — Progress Notes (Signed)
Femostop removed from left femoral artery. Slight hematoma present proximal to access site, aprox 1/2 dollar size. Tegaderm dressing applied. 14 fr aterial sheath aspirated and removed from right femoral artery. Manual pressure applied for 30 minutes. Groin level 0 slight bruising present from groin hold. No hematoma. Tegaderm dressing applied. Bedrest instructions given. Bilateral distal dp pulses present and easily palpable.   Bedrest begins at 19:20:00

## 2015-08-06 NOTE — H&P (View-Only) (Signed)
Patient ID: KAILEB PERDOMO, male   DOB: 1940/11/21, 75 y.o.   MRN: [id number]   SUBJECTIVE: No further chest pain.  No dyspnea at rest.  BP better today and creatinine down to 1.38 with holding BP-active meds and Lasix.  He walked in his room yesterday without problems.  He fell on his right knee prior to admission.  Right knee pain is better and effusion seems smaller.   No further confusion.  LHC (4/18):  85% distal LM with 90% ostial/proximal LCx.  LIMA patent.  Proximal LAD TO, proximal RCA TO, all SVGs occluded.   ECG: NSR, BiV paced  Scheduled Meds: . allopurinol  100 mg Oral BID  . aspirin EC  81 mg Oral Daily  . atorvastatin  80 mg Oral q1800  . carvedilol  9.375 mg Oral BID WC  . clopidogrel  75 mg Oral Daily  . eplerenone  25 mg Oral Daily  . heparin subcutaneous  5,000 Units Subcutaneous Q8H  . insulin aspart  0-9 Units Subcutaneous TID WC  . isosorbide mononitrate  30 mg Oral Daily  . pantoprazole  40 mg Oral Daily  . ranolazine  1,000 mg Oral BID  . sodium chloride flush  3 mL Intravenous Q12H  . sodium chloride flush  3 mL Intravenous Q12H  . sodium chloride flush  3 mL Intravenous Q12H  . venlafaxine XR  150 mg Oral QHS   Continuous Infusions: . sodium chloride Stopped (08/03/15 1750)  . sodium chloride 10 mL/hr at 08/06/15 0125   PRN Meds:.sodium chloride, sodium chloride, sodium chloride, acetaminophen, ALPRAZolam, morphine injection, nitroGLYCERIN, ondansetron (ZOFRAN) IV, sodium chloride flush, sodium chloride flush, sodium chloride flush, zolpidem    Filed Vitals:   08/06/15 0000 08/06/15 0400 08/06/15 0644 08/06/15 0757  BP: 102/60 104/58  113/64  Pulse: 73 75  73  Temp:  98 F (36.7 C)  97.8 F (36.6 C)  TempSrc:  Oral  Oral  Resp: 20 20  18   Height:      Weight:   207 lb 3.2 oz (93.985 kg)   SpO2: 96% 96%  97%    Intake/Output Summary (Last 24 hours) at 08/06/15 0834 Last data filed at 08/06/15 0400  Gross per 24 hour  Intake    750 ml    Output      0 ml  Net    750 ml    LABS: Basic Metabolic Panel:  Recent Labs  08/05/15 1505 08/06/15 0308  NA 136 134*  K 3.4* 3.6  CL 103 101  CO2 21* 22  GLUCOSE 175* 182*  BUN 33* 29*  CREATININE 1.63* 1.38*  CALCIUM 8.3* 8.2*   Liver Function Tests: No results for input(s): AST, ALT, ALKPHOS, BILITOT, PROT, ALBUMIN in the last 72 hours. No results for input(s): LIPASE, AMYLASE in the last 72 hours. CBC:  Recent Labs  08/05/15 1505 08/06/15 0308  WBC 11.0* 9.7  HGB 11.6* 11.4*  HCT 34.7* 34.5*  MCV 91.6 90.3  PLT 188 219   Cardiac Enzymes: No results for input(s): CKTOTAL, CKMB, CKMBINDEX, TROPONINI in the last 72 hours. BNP: Invalid input(s): POCBNP D-Dimer: No results for input(s): DDIMER in the last 72 hours. Hemoglobin A1C: No results for input(s): HGBA1C in the last 72 hours. Fasting Lipid Panel: No results for input(s): CHOL, HDL, LDLCALC, TRIG, CHOLHDL, LDLDIRECT in the last 72 hours. Thyroid Function Tests: No results for input(s): TSH, T4TOTAL, T3FREE, THYROIDAB in the last 72 hours.  Invalid input(s): FREET3 Anemia Panel:  No results for input(s): VITAMINB12, FOLATE, FERRITIN, TIBC, IRON, RETICCTPCT in the last 72 hours.  RADIOLOGY: Dg Chest Port 1 View  08/01/2015  CLINICAL DATA:  75 year old male with a history of chest pain and STEMI EXAM: PORTABLE CHEST 1 VIEW COMPARISON:  12/24/2011 FINDINGS: Cardiomediastinal silhouette is unchanged, though distorted with the apical lordotic positioning. Cardiac pacing device/ defibrillator on the left chest wall is unchanged. Surgical changes of median sternotomy and CABG. Low lung volumes with interstitial opacities. No pneumothorax or large pleural effusion. No large confluent airspace disease. IMPRESSION: Low lung volumes with likely atelectasis and chronic lung changes with no evidence of pneumonia or significant edema. Surgical changes in median sternotomy and CABG. Unchanged left chest wall AICD.  Signed, Dulcy Fanny. [name], DO [facility] Radiology Electronically Signed   By: Corrie Mckusick D.O.   On: 08/01/2015 13:17    PHYSICAL EXAM General: NAD Neck: JVP 8-9 cm, no thyromegaly or thyroid nodule.  Lungs: Clear to auscultation bilaterally with normal respiratory effort. CV: Lateral PMI.  Heart regular S1/S2, no S3/S4, no murmur.  No peripheral edema.   Abdomen: Soft, nontender, no hepatosplenomegaly, no distention.  Neurologic: Alert and oriented x 3.  Psych: Normal affect. Extremities: No clubbing or cyanosis.  MSK: Right knee effusion/warmth.   TELEMETRY: Reviewed telemetry pt in NSR  ASSESSMENT AND PLAN: 75 yo with history of CAD s/p CABG and redo CABG as well as ischemic CMP and Medtronic CRT-D presents with NSTEMI and acute on chronic systolic CHF.  1. Acute on chronic systolic CHF: EF 123XX123 on echo this admission. He has a Medtronic CRT-D device. Lasix has been held prior to cath and now with elevated creatinine. JVP remains mildly elevated but not short of breath and weight stable. - Continue to hold Lasix today with PCI.  - Entresto stopped with low BP, rise in creatinine. Will probably keep him off Entresto given BP issues and start low dose lisinopril after intervention.  - Can increase Coreg to 9.375 mg bid. - Continue eplerenone 25 daily.   2. CAD: s/p CABG and redo. 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 Has had angina chronically. Ranolazine and Imdur have helped with exertional chest pain. This admission, he had prolonged chest pain and NSTEMI with troponin to 6.59.  He has all SVGs and RCA known to be occluded.  LIMA patent.  He has 85% distal LM stenosis with acute angle LCx take-off and 90% ostial/proximal LCx stenosis.  No further chest pain.  - Intervention on the LM/LCx will be complex and high risk.  Creatinine now coming down, 1.38 today.  Baseline around 1.4.  Will plan  PCI with Impella support today with Dr Irish Lack.  - Continue ASA 81, Plavix, statin. Restart Imdur.  - Continue Coreg.  - Ranolazine increased to 1000 mg bid.  - Now off heparin gtt, on DVT prophylaxis dosing.   3. AKI on stage III CKD: Suspect related to hypotension and contrast.  BP-active meds and Lasix held. Creatinine improving, down to 1.38.   [name] 08/06/2015 8:34 AM

## 2015-08-07 LAB — CBC
HCT: 34 % — ABNORMAL LOW (ref 39.0–52.0)
Hemoglobin: 10.9 g/dL — ABNORMAL LOW (ref 13.0–17.0)
MCH: 29.8 pg (ref 26.0–34.0)
MCHC: 32.1 g/dL (ref 30.0–36.0)
MCV: 92.9 fL (ref 78.0–100.0)
Platelets: 238 10*3/uL (ref 150–400)
RBC: 3.66 MIL/uL — ABNORMAL LOW (ref 4.22–5.81)
RDW: 15.2 % (ref 11.5–15.5)
WBC: 8.2 10*3/uL (ref 4.0–10.5)

## 2015-08-07 LAB — BASIC METABOLIC PANEL
Anion gap: 11 (ref 5–15)
BUN: 27 mg/dL — AB (ref 6–20)
CHLORIDE: 104 mmol/L (ref 101–111)
CO2: 21 mmol/L — AB (ref 22–32)
CREATININE: 1.28 mg/dL — AB (ref 0.61–1.24)
Calcium: 8 mg/dL — ABNORMAL LOW (ref 8.9–10.3)
GFR calc Af Amer: >60 mL/min (ref >60)
GFR calc non Af Amer: 54 mL/min — ABNORMAL LOW (ref >60)
Glucose, Bld: 198 mg/dL — ABNORMAL HIGH (ref 65–99)
Potassium: 3.7 mmol/L (ref 3.5–5.1)
SODIUM: 136 mmol/L (ref 135–145)

## 2015-08-07 LAB — GLUCOSE, CAPILLARY
GLUCOSE-CAPILLARY: 205 mg/dL — AB (ref 65–99)
GLUCOSE-CAPILLARY: 238 mg/dL — AB (ref 65–99)
GLUCOSE-CAPILLARY: 241 mg/dL — AB (ref 65–99)
Glucose-Capillary: 209 mg/dL — ABNORMAL HIGH (ref 65–99)

## 2015-08-07 MED ORDER — FUROSEMIDE 40 MG PO TABS
40.0000 mg | ORAL_TABLET | Freq: Every day | ORAL | Status: DC
  Administered 2015-08-07: 40 mg via ORAL
  Filled 2015-08-07: qty 1

## 2015-08-07 MED ORDER — LISINOPRIL 2.5 MG PO TABS
2.5000 mg | ORAL_TABLET | Freq: Every day | ORAL | Status: DC
  Administered 2015-08-07 – 2015-08-08 (×2): 2.5 mg via ORAL
  Filled 2015-08-07 (×2): qty 1

## 2015-08-07 MED ORDER — RANOLAZINE ER 500 MG PO TB12
500.0000 mg | ORAL_TABLET | Freq: Two times a day (BID) | ORAL | Status: DC
  Administered 2015-08-07 – 2015-08-08 (×3): 500 mg via ORAL
  Filled 2015-08-07 (×3): qty 1

## 2015-08-07 NOTE — Progress Notes (Signed)
Wife at bedside made aware of plan to  transfer to [phone]. Report called to [facility] on unit 3W. Patient to be transferred to 3W via wheelchair.

## 2015-08-07 NOTE — Progress Notes (Signed)
Patient ID: [name], male   DOB: [birth year], 75 y.o.   MRN: [id number]   SUBJECTIVE: Doing well.  No chest pain or dyspnea.    No further confusion.  LHC (4/18):  85% distal LM with 90% ostial/proximal LCx.  LIMA patent.  Proximal LAD TO, proximal RCA TO, all SVGs occluded.   PCI (4/21) with Impella support: DES to LM, angioplasty to ostial/proximal LCx.   Scheduled Meds: . allopurinol  100 mg Oral BID  . aspirin  81 mg Oral Daily  . atorvastatin  80 mg Oral q1800  . carvedilol  9.375 mg Oral BID WC  . clopidogrel  75 mg Oral Q breakfast  . eplerenone  25 mg Oral Daily  . furosemide  40 mg Oral Daily  . heparin subcutaneous  5,000 Units Subcutaneous Q8H  . insulin aspart  0-9 Units Subcutaneous TID WC  . isosorbide mononitrate  30 mg Oral Daily  . lisinopril  2.5 mg Oral Daily  . pantoprazole  40 mg Oral Daily  . ranolazine  500 mg Oral BID  . sodium chloride flush  3 mL Intravenous Q12H  . sodium chloride flush  3 mL Intravenous Q12H  . sodium chloride flush  3 mL Intravenous Q12H  . venlafaxine XR  150 mg Oral QHS   Continuous Infusions: . sodium chloride Stopped (08/03/15 1750)   PRN Meds:.sodium chloride, sodium chloride, sodium chloride, acetaminophen, ALPRAZolam, morphine injection, nitroGLYCERIN, ondansetron (ZOFRAN) IV, sodium chloride flush, sodium chloride flush, sodium chloride flush, zolpidem    Filed Vitals:   08/07/15 0100 08/07/15 0200 08/07/15 0300 08/07/15 0400  BP: 103/51 101/55 100/58 111/56  Pulse: 70 71 69 69  Temp:    97.7 F (36.5 C)  TempSrc:    Oral  Resp: 14 14 12 15   Height:      Weight:    208 lb 8 oz (94.575 kg)  SpO2: 97% 98% 94% 97%    Intake/Output Summary (Last 24 hours) at 08/07/15 0734 Last data filed at 08/07/15 0500  Gross per 24 hour  Intake  780.5 ml  Output    700 ml  Net   80.5 ml    LABS: Basic Metabolic Panel:  Recent Labs  08/06/15 0308 08/07/15 0220  NA 134* 136  K 3.6 3.7  CL 101 104  CO2 22 21*    GLUCOSE 182* 198*  BUN 29* 27*  CREATININE 1.38* 1.28*  CALCIUM 8.2* 8.0*   Liver Function Tests: No results for input(s): AST, ALT, ALKPHOS, BILITOT, PROT, ALBUMIN in the last 72 hours. No results for input(s): LIPASE, AMYLASE in the last 72 hours. CBC:  Recent Labs  08/06/15 0308 08/07/15 0220  WBC 9.7 8.2  HGB 11.4* 10.9*  HCT 34.5* 34.0*  MCV 90.3 92.9  PLT 219 238   Cardiac Enzymes: No results for input(s): CKTOTAL, CKMB, CKMBINDEX, TROPONINI in the last 72 hours. BNP: Invalid input(s): POCBNP D-Dimer: No results for input(s): DDIMER in the last 72 hours. Hemoglobin A1C: No results for input(s): HGBA1C in the last 72 hours. Fasting Lipid Panel: No results for input(s): CHOL, HDL, LDLCALC, TRIG, CHOLHDL, LDLDIRECT in the last 72 hours. Thyroid Function Tests: No results for input(s): TSH, T4TOTAL, T3FREE, THYROIDAB in the last 72 hours.  Invalid input(s): FREET3 Anemia Panel: No results for input(s): VITAMINB12, FOLATE, FERRITIN, TIBC, IRON, RETICCTPCT in the last 72 hours.  RADIOLOGY: Dg Chest Port 1 View  08/01/2015  CLINICAL DATA:  75 year old male with a history of chest pain  and STEMI EXAM: PORTABLE CHEST 1 VIEW COMPARISON:  12/24/2011 FINDINGS: Cardiomediastinal silhouette is unchanged, though distorted with the apical lordotic positioning. Cardiac pacing device/ defibrillator on the left chest wall is unchanged. Surgical changes of median sternotomy and CABG. Low lung volumes with interstitial opacities. No pneumothorax or large pleural effusion. No large confluent airspace disease. IMPRESSION: Low lung volumes with likely atelectasis and chronic lung changes with no evidence of pneumonia or significant edema. Surgical changes in median sternotomy and CABG. Unchanged left chest wall AICD. Signed, Dulcy Fanny. [name], DO [facility] Radiology Electronically Signed   By: Corrie Mckusick D.O.   On: 08/01/2015 13:17     PHYSICAL EXAM General: NAD Neck: JVP 8 cm, no thyromegaly or thyroid nodule.  Lungs: Clear to auscultation bilaterally with normal respiratory effort. CV: Lateral PMI.  Heart regular S1/S2, no S3/S4, no murmur.  No peripheral edema.   Abdomen: Soft, nontender, no hepatosplenomegaly, no distention.  Neurologic: Alert and oriented x 3.  Psych: Normal affect. Extremities: No clubbing or cyanosis. Right and left groin cath sites stable. MSK: Right knee effusion/warmth.   TELEMETRY: Reviewed telemetry pt in NSR  ASSESSMENT AND PLAN: 75 yo with history of CAD s/p CABG and redo CABG as well as ischemic CMP and Medtronic CRT-D presents with NSTEMI and acute on chronic systolic CHF.  1. Acute on chronic systolic CHF: EF 123XX123 on echo this admission. He has a Medtronic CRT-D device. Lasix held for several days with need for contrast and elevated creatinine.  Creatinine now down to 1.28. Weight is stable.  - Can resume Lasix 40 mg daily (was on 40 mg bid at home).  [name] stopped with low BP, rise in creatinine. Will keep him off Entresto for now given BP issues and start lisinopril 2.5 mg daily today.  - Continue Coreg 9.375 mg bid. - Continue eplerenone 25 daily.   2. CAD: s/p CABG and redo. 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  This admission, he had prolonged chest pain and NSTEMI with troponin to 6.59.  He has all SVGs and RCA known to be occluded.  LIMA patent.  Angiography showed 85% distal LM stenosis with acute angle LCx take-off and 90% ostial/proximal LCx stenosis.  On 4/21, he had DES to distal LM and angioplasty to ostial/proximal LCx with Impella support.  Hemoglobin stable and groin sites look ok.  - Continue ASA 81, Plavix, statin, Imdur.   - Can decrease ranolazine to home dose 500 mg bid.   3. AKI on stage III CKD: Suspect related to hypotension and contrast.  Creatinine now down to 1.28.  4. Disposition: Want him out of bed  walking today, can go to telemetry.  Suspect he can go home tomorrow.    [name] 08/07/2015 7:34 AM

## 2015-08-07 NOTE — Progress Notes (Signed)
CARDIAC REHAB PHASE I   PRE:  Rate/Rhythm: 75 SR PVCs  BP:  Supine: 120/65  Sitting:   Standing:    SaO2: 99%RA  MODE:  Ambulation: 350 ft   POST:  Rate/Rhythm: 94 SR  BP:  Supine:   Sitting: 128/68  Standing:    SaO2: 98%RA 1245-1345 Pt walked 350 ft on RA with gait belt use and asst x1 with fairly steady gait. A little wobbly when first standing. No CP or c/o SOB. MI and CHF education completed with pt and wife. Stressed importance of plavix with stent. Reviewed when to call MD with weight gain and 2000 mg sodium restriction. Reviewed NTG use, ex ed and PHASE 2. Will refer to [facility] program.    [name], RN BSN  08/07/2015 1:40 PM

## 2015-08-07 NOTE — Plan of Care (Signed)
Problem: Consults Goal: Chest Pain Patient Education (See Patient Education module for education specifics.)  Outcome: Progressing Assess Educational needs r/t s/s of Chest Pain  Problem: Phase I Progression Outcomes Goal: Hemodynamically stable Outcome: Progressing VSS Goal: Anginal pain relieved Outcome: Progressing Denies Chest Pain

## 2015-08-08 ENCOUNTER — Other Ambulatory Visit (HOSPITAL_COMMUNITY): Payer: Self-pay | Admitting: Cardiology

## 2015-08-08 DIAGNOSIS — I480 Paroxysmal atrial fibrillation: Secondary | ICD-10-CM

## 2015-08-08 LAB — BASIC METABOLIC PANEL
ANION GAP: 12 (ref 5–15)
BUN: 20 mg/dL (ref 6–20)
CO2: 19 mmol/L — AB (ref 22–32)
Calcium: 8.3 mg/dL — ABNORMAL LOW (ref 8.9–10.3)
Chloride: 104 mmol/L (ref 101–111)
Creatinine, Ser: 1.15 mg/dL (ref 0.61–1.24)
GFR calc Af Amer: >60 mL/min (ref >60)
GLUCOSE: 157 mg/dL — AB (ref 65–99)
POTASSIUM: 3.4 mmol/L — AB (ref 3.5–5.1)
Sodium: 135 mmol/L (ref 135–145)

## 2015-08-08 LAB — CBC
HEMATOCRIT: 33.8 % — AB (ref 39.0–52.0)
HEMOGLOBIN: 11 g/dL — AB (ref 13.0–17.0)
MCH: 30 pg (ref 26.0–34.0)
MCHC: 32.5 g/dL (ref 30.0–36.0)
MCV: 92.1 fL (ref 78.0–100.0)
Platelets: 276 10*3/uL (ref 150–400)
RBC: 3.67 MIL/uL — ABNORMAL LOW (ref 4.22–5.81)
RDW: 15.1 % (ref 11.5–15.5)
WBC: 9.3 10*3/uL (ref 4.0–10.5)

## 2015-08-08 LAB — GLUCOSE, CAPILLARY
GLUCOSE-CAPILLARY: 242 mg/dL — AB (ref 65–99)
Glucose-Capillary: 148 mg/dL — ABNORMAL HIGH (ref 65–99)

## 2015-08-08 MED ORDER — CARVEDILOL 12.5 MG PO TABS: 12.5000 mg | ORAL_TABLET | Freq: Two times a day (BID) | ORAL | Status: DC

## 2015-08-08 MED ORDER — LISINOPRIL 2.5 MG PO TABS: 2.5000 mg | ORAL_TABLET | Freq: Every day | ORAL | Status: DC

## 2015-08-08 MED ORDER — ISOSORBIDE MONONITRATE ER 30 MG PO TB24: 30.0000 mg | ORAL_TABLET | Freq: Every day | ORAL | Status: DC

## 2015-08-08 MED ORDER — FUROSEMIDE 40 MG PO TABS
40.0000 mg | ORAL_TABLET | Freq: Two times a day (BID) | ORAL | Status: DC
  Administered 2015-08-08: 40 mg via ORAL
  Filled 2015-08-08: qty 1

## 2015-08-08 MED ORDER — POTASSIUM CHLORIDE CRYS ER 20 MEQ PO TBCR
40.0000 meq | EXTENDED_RELEASE_TABLET | Freq: Once | ORAL | Status: AC
  Administered 2015-08-08: 40 meq via ORAL
  Filled 2015-08-08: qty 2

## 2015-08-08 MED ORDER — POTASSIUM CHLORIDE CRYS ER 20 MEQ PO TBCR: 20.0000 meq | EXTENDED_RELEASE_TABLET | Freq: Every day | ORAL | Status: DC

## 2015-08-08 MED ORDER — ATORVASTATIN CALCIUM 80 MG PO TABS: 80.0000 mg | ORAL_TABLET | Freq: Every day | ORAL | Status: DC

## 2015-08-08 NOTE — Discharge Summary (Signed)
Discharge Summary    Patient ID: [name],  MRN: [id number], DOB/AGE: 73/31/1942 73 y.o.  Admit date: 08/01/2015 Discharge date: 08/08/2015  Primary Care Provider: Mayra Neer Primary Cardiologist: Dr. Aundra Dubin  Discharge Diagnoses    Active Problems:   Ischemic cardiomyopathy   Hyperlipemia   CAD (coronary artery disease)   HTN (hypertension)   DM (diabetes mellitus) ([place])   Chronic systolic CHF (congestive heart failure) (HCC)   Paroxysmal atrial fibrillation (HCC)   CKD (chronic kidney disease) stage 3, GFR 30-59 ml/min   Chest pain with high risk of acute coronary syndrome   Unstable angina (HCC)   NSTEMI (non-ST elevated myocardial infarction) ([place])   Allergies Allergies  Allergen Reactions  . Codeine Nausea And Vomiting  . Latex Rash    Specifies adhesive     Diagnostic Studies/Procedures    Procedures - LHC 08/06/15   Coronary Balloon Angioplasty   Coronary Stent Intervention w/Impella   Left Heart Cath    Conclusion     LM lesion, 85% stenosed. Post intervention with a 4.0 x 12 Synergy drug eluting stent, there is a 0% residual stenosis.  Ost Cx to Prox Cx lesion, 90% stenosed. Post intervention with balloon angioplasty (2.5 x 20 balloon), there is a 20% residual stenosis.  PCI performed with Impella hemodynamic support.       History of Present Illness     [name] is a 75 year old male with past medical history of CAD s/p CABG and re do CABG in 0000000, chronic systolic congestive heart failure, ischemic cardiomyopathy s/p Medtronic CRT-D device placement, hyperlipidemia, hypertension, diabetes, and PAF.  Last echo was in March 2017, EF was 35% with mild LVH, inferior and inferolateral akinesis, and anterior lateral hypokinesis. He is followed in the CHF clinic and at last visit his weight was stable, he reported exertional chest and abdominal pain with exertion felt to be GI related.   Cardiolite done on 02/25/2015 showed primarily  scar from old MI with minimal ischemia. EF was estimated at 20%. Cath in April 2015 showed SVG to RCA and circumflex to be totally occluded.   He presented to the ED 08/01/15 via EMS with chest pain that began around 9 am. He described the pain as tightness and heavy, with no radiation. This pain is not like his usual angina, this pain is less severe. He took 3 sublingual nitroglycerin with no relief. Code STEMI was initially called but canceled. Initial troponin was negative.   EKG showed sinus tachycardia with RBBB, and he was V paced. He was felt to be volume overloaded on physical exam and was admitted for acute on chronic systolic HF.   Hospital Course      Patient was admitted to telemetry for diuresis. Cardiac enzymes were cycled and he ruled in for NSTEMI. Troponin peaked at 6.59. Management of acute problems outlined below.  1. Acute on chronic systolic CHF: EF 123XX123 on echo this admission. He has a Medtronic CRT-D device. Lasix held for several days with need for contrast and elevated creatinine. Creatinine now down to 1.15. Weight is stable. Discharge weight 207 lb. - Now back on home Lasix 40 mg bid.  [name] stopped with low BP, rise in creatinine. Will keep him off Entresto for now given BP issues and start lisinopril 2.5 mg daily. - Increase Coreg back to 12.5 mg bid.  - Continue eplerenone 25 daily.   2. CAD: s/p CABG and redo. Lexiscan Cardiolite 02/25/15 with primarily scar from  prior MI, minimal ischemia. This admission, he had prolonged chest pain and NSTEMI with troponin to 6.59. He has all SVGs and RCA known to be occluded. LIMA patent. Angiography showed 85% distal LM stenosis with acute angle LCx take-off and 90% ostial/proximal LCx stenosis. On 4/21, he had DES to distal LM and angioplasty to ostial/proximal LCx with Impella support. Hemoglobin stable and groin sites look ok.  - Continue ASA 81, Plavix, statin, Imdur.  - Continue ranolazine to home  dose 500 mg bid.    3. AKI on stage III CKD: Suspect related to hypotension and contrast. Creatinine now down to 1.15.   The patient was seen and examined by Dr. Aundra Dubin on 08/08/15 and was felt to be stable for discharge home. Discharge weight was 207 lb. Discharge recommendations are as follows.   4. Disposition: Followup with Dr. Aundra Dubin in [place] clinic in 10-14 days. Home meds: Lasix 40 mg bid, KCl 20 daily, Coreg 12.5 bid, eplerenone 25 daily, lisinopril 2.5 daily, ASA 81, Plavix 75, Imdur 30 daily, atorvastatin 80 daily, ranolazine 500 bid, Protonix.   Consultants: none   Discharge Vitals Blood pressure 139/84, pulse 84, temperature 97.5 F (36.4 C), temperature source Oral, resp. rate 17, height 5\' 6"  (1.676 m), weight 207 lb 8 oz (94.121 kg), SpO2 100 %.  Filed Weights   08/06/15 0644 08/07/15 0400 08/08/15 0435  Weight: 207 lb 3.2 oz (93.985 kg) 208 lb 8 oz (94.575 kg) 207 lb 8 oz (94.121 kg)    Labs & Radiologic Studies    CBC  Recent Labs  08/07/15 0220 08/08/15 0629  WBC 8.2 9.3  HGB 10.9* 11.0*  HCT 34.0* 33.8*  MCV 92.9 92.1  PLT 238 [id number]   Basic Metabolic Panel  Recent Labs  08/07/15 0220 08/08/15 0629  NA 136 135  K 3.7 3.4*  CL 104 104  CO2 21* 19*  GLUCOSE 198* 157*  BUN 27* 20  CREATININE 1.28* 1.15  CALCIUM 8.0* 8.3*   Liver Function Tests No results for input(s): AST, ALT, ALKPHOS, BILITOT, PROT, ALBUMIN in the last 72 hours. No results for input(s): LIPASE, AMYLASE in the last 72 hours. Cardiac Enzymes No results for input(s): CKTOTAL, CKMB, CKMBINDEX, TROPONINI in the last 72 hours. BNP Invalid input(s): POCBNP D-Dimer No results for input(s): DDIMER in the last 72 hours. Hemoglobin A1C No results for input(s): HGBA1C in the last 72 hours. Fasting Lipid Panel No results for input(s): CHOL, HDL, LDLCALC, TRIG, CHOLHDL, LDLDIRECT in the last 72 hours. Thyroid Function Tests No results for input(s): TSH, T4TOTAL, T3FREE, THYROIDAB in  the last 72 hours.  Invalid input(s): FREET3 _____________  Dg Chest Port 1 View  08/01/2015  CLINICAL DATA:  75 year old male with a history of chest pain and STEMI EXAM: PORTABLE CHEST 1 VIEW COMPARISON:  12/24/2011 FINDINGS: Cardiomediastinal silhouette is unchanged, though distorted with the apical lordotic positioning. Cardiac pacing device/ defibrillator on the left chest wall is unchanged. Surgical changes of median sternotomy and CABG. Low lung volumes with interstitial opacities. No pneumothorax or large pleural effusion. No large confluent airspace disease. IMPRESSION: Low lung volumes with likely atelectasis and chronic lung changes with no evidence of pneumonia or significant edema. Surgical changes in median sternotomy and CABG. Unchanged left chest wall AICD. Signed, Dulcy Fanny. [name], DO [facility] Radiology Electronically Signed   By: Corrie Mckusick D.O.   On: 08/01/2015 13:17   Disposition   Pt is being discharged home today in good condition.  Follow-up Plans & Appointments    Follow-up Information    Follow up with [facility].   Specialty:  Cardiology   Why:  our office will call you with a f/u appointment with Dr. Lavell Islam information:   [id number] [place] [phone]     Discharge Instructions    Amb Referral to Cardiac Rehabilitation    Complete by:  As directed   Diagnosis:   NSTEMI Coronary Stents       Diet - low sodium heart healthy    Complete by:  As directed      Increase activity slowly    Complete by:  As directed            Discharge Medications   Current Discharge Medication List    START taking these medications   Details  lisinopril (PRINIVIL,ZESTRIL) 2.5 MG tablet Take 1 tablet (2.5 mg total) by mouth daily. Qty: 30 tablet, Refills: 5    potassium chloride SA (K-DUR,KLOR-CON) 20 MEQ  tablet Take 1 tablet (20 mEq total) by mouth daily. Qty: 30 tablet, Refills: 5      CONTINUE these medications which have CHANGED   Details  atorvastatin (LIPITOR) 80 MG tablet Take 1 tablet (80 mg total) by mouth daily at 6 PM. Qty: 30 tablet, Refills: 5    carvedilol (COREG) 12.5 MG tablet Take 1 tablet (12.5 mg total) by mouth 2 (two) times daily with a meal. Qty: 30 tablet, Refills: 5    isosorbide mononitrate (IMDUR) 30 MG 24 hr tablet Take 1 tablet (30 mg total) by mouth at bedtime. Qty: 30 tablet, Refills: 5   Associated Diagnoses: Chronic systolic CHF (congestive heart failure) (HCC)      CONTINUE these medications which have NOT CHANGED   Details  allopurinol (ZYLOPRIM) 100 MG tablet Take 100 mg by mouth 2 (two) times daily.     aspirin EC 81 MG tablet Take 81 mg by mouth daily.    clopidogrel (PLAVIX) 75 MG tablet Take 1 tablet (75 mg total) by mouth daily. Qty: 90 tablet, Refills: 3    colchicine 0.6 MG tablet Take 1 tablet (0.6 mg total) by mouth daily as needed (for gout flare). Qty: 30 tablet, Refills: 6   Associated Diagnoses: Chronic systolic CHF (congestive heart failure) (HCC)    eplerenone (INSPRA) 25 MG tablet Take 1 tablet (25 mg total) by mouth every evening. Qty: 30 tablet, Refills: 6   Associated Diagnoses: Chronic systolic CHF (congestive heart failure) (HCC)    furosemide (LASIX) 40 MG tablet Take 40 mg by mouth 2 (two) times daily.    insulin lispro protamine-lispro (HUMALOG 50/50 MIX) (50-50) 100 UNIT/ML SUSP injection Inject 20-30 Units into the skin 3 (three) times daily. Takes 30 units in the morning 30 at lunch and 20 at night    ONE TOUCH ULTRA TEST test strip     ONETOUCH DELICA LANCETS [id number] MISC     pantoprazole (PROTONIX) 40 MG tablet Take 1 tablet (40 mg total) by mouth daily. Qty: 30 tablet, Refills: 11    ranolazine (RANEXA) 1000 MG SR tablet Take 500 mg by mouth 2 (two) times daily.    venlafaxine (EFFEXOR-XR) 150 MG 24 hr capsule  Take 150 mg by mouth at bedtime.     zolpidem (AMBIEN CR) 12.5 MG CR tablet Take 12.5 mg by mouth at bedtime as needed. For sleep    nitroGLYCERIN (NITROSTAT) 0.4  MG SL tablet Place 1 tablet (0.4 mg total) under the tongue every 5 (five) minutes x 3 doses as needed. For chest pain. Qty: 25 tablet, Refills: 3      STOP taking these medications     ENTRESTO 49-51 MG          Aspirin prescribed at discharge?  Yes High Intensity Statin Prescribed? (Lipitor 40-80mg  or Crestor 20-40mg ): Yes Beta Blocker Prescribed? Yes For EF <40%, was ACEI/ARB Prescribed? Yes ADP Receptor Inhibitor Prescribed? (i.e. Plavix etc.-Includes Medically Managed Patients): Yes For EF <40%, Aldosterone Inhibitor Prescribed? Yes Was EF assessed during THIS hospitalization? Yes Was Cardiac Rehab II ordered? (Included Medically managed Patients): No:    Outstanding Labs/Studies   None  Duration of Discharge Encounter  Greater than 30 minutes including physician time.  Signed, Javaughn Opdahl PA-C 08/08/2015, 9:49 AM

## 2015-08-08 NOTE — Progress Notes (Signed)
Patient ID: [name], male   DOB: 12-May-1940, 75 y.o.   MRN: [id number]   SUBJECTIVE: Doing well.  No chest pain or dyspnea.    No further confusion.  LHC (4/18):  85% distal LM with 90% ostial/proximal LCx.  LIMA patent.  Proximal LAD TO, proximal RCA TO, all SVGs occluded.   PCI (4/21) with Impella support: DES to LM, angioplasty to ostial/proximal LCx.   Scheduled Meds: . allopurinol  100 mg Oral BID  . aspirin  81 mg Oral Daily  . atorvastatin  80 mg Oral q1800  . carvedilol  12.5 mg Oral BID WC  . clopidogrel  75 mg Oral Q breakfast  . eplerenone  25 mg Oral Daily  . furosemide  40 mg Oral BID  . heparin subcutaneous  5,000 Units Subcutaneous Q8H  . insulin aspart  0-9 Units Subcutaneous TID WC  . isosorbide mononitrate  30 mg Oral Daily  . lisinopril  2.5 mg Oral Daily  . pantoprazole  40 mg Oral Daily  . potassium chloride  40 mEq Oral Once  . ranolazine  500 mg Oral BID  . sodium chloride flush  3 mL Intravenous Q12H  . sodium chloride flush  3 mL Intravenous Q12H  . sodium chloride flush  3 mL Intravenous Q12H  . venlafaxine XR  150 mg Oral QHS   Continuous Infusions: . sodium chloride Stopped (08/03/15 1750)   PRN Meds:.sodium chloride, sodium chloride, sodium chloride, acetaminophen, ALPRAZolam, morphine injection, nitroGLYCERIN, ondansetron (ZOFRAN) IV, sodium chloride flush, sodium chloride flush, sodium chloride flush, zolpidem    Filed Vitals:   08/07/15 1542 08/07/15 2043 08/08/15 0435 08/08/15 0850  BP: 107/55 95/54 121/54 139/84  Pulse: 76 65 71 84  Temp: 97.6 F (36.4 C) 98 F (36.7 C) 97.5 F (36.4 C)   TempSrc: Oral Oral    Resp:  16 17   Height:      Weight:   207 lb 8 oz (94.121 kg)   SpO2: 99% 100% 100%     Intake/Output Summary (Last 24 hours) at 08/08/15 0901 Last data filed at 08/08/15 0438  Gross per 24 hour  Intake    123 ml  Output      0 ml  Net    123 ml    LABS: Basic Metabolic Panel:  Recent Labs  08/07/15 0220  08/08/15 0629  NA 136 135  K 3.7 3.4*  CL 104 104  CO2 21* 19*  GLUCOSE 198* 157*  BUN 27* 20  CREATININE 1.28* 1.15  CALCIUM 8.0* 8.3*   Liver Function Tests: No results for input(s): AST, ALT, ALKPHOS, BILITOT, PROT, ALBUMIN in the last 72 hours. No results for input(s): LIPASE, AMYLASE in the last 72 hours. CBC:  Recent Labs  08/07/15 0220 08/08/15 0629  WBC 8.2 9.3  HGB 10.9* 11.0*  HCT 34.0* 33.8*  MCV 92.9 92.1  PLT 238 276   Cardiac Enzymes: No results for input(s): CKTOTAL, CKMB, CKMBINDEX, TROPONINI in the last 72 hours. BNP: Invalid input(s): POCBNP D-Dimer: No results for input(s): DDIMER in the last 72 hours. Hemoglobin A1C: No results for input(s): HGBA1C in the last 72 hours. Fasting Lipid Panel: No results for input(s): CHOL, HDL, LDLCALC, TRIG, CHOLHDL, LDLDIRECT in the last 72 hours. Thyroid Function Tests: No results for input(s): TSH, T4TOTAL, T3FREE, THYROIDAB in the last 72 hours.  Invalid input(s): FREET3 Anemia Panel: No results for input(s): VITAMINB12, FOLATE, FERRITIN, TIBC, IRON, RETICCTPCT in the last 72 hours.  RADIOLOGY:  Dg Chest Port 1 View  08/01/2015  CLINICAL DATA:  75 year old male with a history of chest pain and STEMI EXAM: PORTABLE CHEST 1 VIEW COMPARISON:  12/24/2011 FINDINGS: Cardiomediastinal silhouette is unchanged, though distorted with the apical lordotic positioning. Cardiac pacing device/ defibrillator on the left chest wall is unchanged. Surgical changes of median sternotomy and CABG. Low lung volumes with interstitial opacities. No pneumothorax or large pleural effusion. No large confluent airspace disease. IMPRESSION: Low lung volumes with likely atelectasis and chronic lung changes with no evidence of pneumonia or significant edema. Surgical changes in median sternotomy and CABG. Unchanged left chest wall AICD. Signed, Dulcy Fanny. [name], DO [facility] Electronically  Signed   By: Corrie Mckusick D.O.   On: 08/01/2015 13:17    PHYSICAL EXAM General: NAD Neck: JVP 7 cm, no thyromegaly or thyroid nodule.  Lungs: Clear to auscultation bilaterally with normal respiratory effort. CV: Lateral PMI.  Heart regular S1/S2, no S3/S4, no murmur.  No peripheral edema.   Abdomen: Soft, nontender, no hepatosplenomegaly, no distention.  Neurologic: Alert and oriented x 3.  Psych: Normal affect. Extremities: No clubbing or cyanosis. Right and left groin cath sites stable. MSK: Right knee effusion/warmth.   TELEMETRY: Reviewed telemetry pt in NSR  ASSESSMENT AND PLAN: 75 yo with history of CAD s/p CABG and redo CABG as well as ischemic CMP and Medtronic CRT-D presents with NSTEMI and acute on chronic systolic CHF.  1. Acute on chronic systolic CHF: EF [id number] on echo this admission. He has a Medtronic CRT-D device. Lasix held for several days with need for contrast and elevated creatinine.  Creatinine now down to 1.15. Weight is stable.  - Now back on home Lasix 40 mg bid.  [name] stopped with low BP, rise in creatinine. Will keep him off Entresto for now given BP issues and start lisinopril 2.5 mg daily today.  - Increase Coreg back to 12.5 mg bid.  - Continue eplerenone 25 daily.   2. CAD: s/p CABG and redo. 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  This admission, he had prolonged chest pain and NSTEMI with troponin to 6.59.  He has all SVGs and RCA known to be occluded.  LIMA patent.  Angiography showed 85% distal LM stenosis with acute angle LCx take-off and 90% ostial/proximal LCx stenosis.  On 4/21, he had DES to distal LM and angioplasty to ostial/proximal LCx with Impella support.  Hemoglobin stable and groin sites look ok.  - Continue ASA 81, Plavix, statin, Imdur.   - Continue ranolazine to home dose 500 mg bid.   3. AKI on stage III CKD: Suspect related to hypotension and contrast.  Creatinine now down to 1.15.    4. Disposition: Home today.  Followup with me in CHF clinic in 10-14 days.  Home meds: Lasix 40 mg bid, KCl 20 daily, Coreg 12.5 bid, eplerenone 25 daily, lisinopril 2.5 daily, ASA 81, Plavix 75, Imdur 30 daily, atorvastatin 80 daily, ranolazine 500 bid, Protonix.     [name] 08/08/2015 9:01 AM

## 2015-08-08 NOTE — Discharge Instructions (Signed)
Heart-Healthy Eating Plan Many factors influence your heart health, including eating and exercise habits. Heart (coronary) risk increases with abnormal blood fat (lipid) levels. Heart-healthy meal planning includes limiting unhealthy fats, increasing healthy fats, and making other small dietary changes. This includes maintaining a healthy body weight to help keep lipid levels within a normal range. WHAT IS MY PLAN?  Your health care provider recommends that you:  Get no more than _________% of the total calories in your daily diet from fat.  Limit your intake of saturated fat to less than _________% of your total calories each day.  Limit the amount of cholesterol in your diet to less than _________ mg per day. WHAT TYPES OF FAT SHOULD I CHOOSE?  Choose healthy fats more often. Choose monounsaturated and polyunsaturated fats, such as olive oil and canola oil, flaxseeds, walnuts, almonds, and seeds.  Eat more omega-3 fats. Good choices include salmon, mackerel, sardines, tuna, flaxseed oil, and ground flaxseeds. Aim to eat fish at least two times each week.  Limit saturated fats. Saturated fats are primarily found in animal products, such as meats, butter, and cream. Plant sources of saturated fats include palm oil, palm kernel oil, and coconut oil.  Avoid foods with partially hydrogenated oils in them. These contain trans fats. Examples of foods that contain trans fats are stick margarine, some tub margarines, cookies, crackers, and other baked goods. WHAT GENERAL GUIDELINES DO I NEED TO FOLLOW?  Check food labels carefully to identify foods with trans fats or high amounts of saturated fat.  Fill one half of your plate with vegetables and green salads. Eat 4-5 servings of vegetables per day. A serving of vegetables equals 1 cup of raw leafy vegetables,  cup of raw or cooked cut-up vegetables, or  cup of vegetable juice.  Fill one fourth of your plate with whole grains. Look for the word  "whole" as the first word in the ingredient list.  Fill one fourth of your plate with lean protein foods.  Eat 4-5 servings of fruit per day. A serving of fruit equals one medium whole fruit,  cup of dried fruit,  cup of fresh, frozen, or canned fruit, or  cup of 100% fruit juice.  Eat more foods that contain soluble fiber. Examples of foods that contain this type of fiber are apples, broccoli, carrots, beans, peas, and barley. Aim to get 20-30 g of fiber per day.  Eat more home-cooked food and less restaurant, buffet, and fast food.  Limit or avoid alcohol.  Limit foods that are high in starch and sugar.  Avoid fried foods.  Cook foods by using methods other than frying. Baking, boiling, grilling, and broiling are all great options. Other fat-reducing suggestions include:  Removing the skin from poultry.  Removing all visible fats from meats.  Skimming the fat off of stews, soups, and gravies before serving them.  Steaming vegetables in water or broth.  Lose weight if you are overweight. Losing just 5-10% of your initial body weight can help your overall health and prevent diseases such as diabetes and heart disease.  Increase your consumption of nuts, legumes, and seeds to 4-5 servings per week. One serving of dried beans or legumes equals  cup after being cooked, one serving of nuts equals 1 ounces, and one serving of seeds equals  ounce or 1 tablespoon.  You may need to monitor your salt (sodium) intake, especially if you have high blood pressure. Talk with your health care provider or dietitian to get  more information about reducing sodium. °WHAT FOODS CAN I EAT? °Grains °Breads, including French, white, pita, wheat, raisin, rye, oatmeal, and Italian. Tortillas that are neither fried nor made with lard or trans fat. Low-fat rolls, including hotdog and hamburger buns and English muffins. Biscuits. Muffins. Waffles. Pancakes. Light popcorn. Whole-grain cereals. Flatbread. Melba  toast. Pretzels. Breadsticks. Rusks. Low-fat snacks and crackers, including oyster, saltine, matzo, graham, animal, and rye. Rice and pasta, including brown rice and those that are made with whole wheat. °Vegetables °All vegetables. °Fruits °All fruits, but limit coconut. °Meats and Other Protein Sources °Lean, well-trimmed beef, veal, pork, and lamb. Chicken and turkey without skin. All fish and shellfish. Wild duck, rabbit, pheasant, and venison. Egg whites or low-cholesterol egg substitutes. Dried beans, peas, lentils, and tofu. Seeds and most nuts. °Dairy °Low-fat or nonfat cheeses, including ricotta, string, and mozzarella. Skim or 1% milk that is liquid, powdered, or evaporated. Buttermilk that is made with low-fat milk. Nonfat or low-fat yogurt. °Beverages °Mineral water. Diet carbonated beverages. °Sweets and Desserts °Sherbets and fruit ices. Honey, jam, marmalade, jelly, and syrups. Meringues and gelatins. Pure sugar candy, such as hard candy, jelly beans, gumdrops, mints, marshmallows, and small amounts of dark chocolate. Angel food cake. °Eat all sweets and desserts in moderation. °Fats and Oils °Nonhydrogenated (trans-free) margarines. Vegetable oils, including soybean, sesame, sunflower, olive, peanut, safflower, corn, canola, and cottonseed. Salad dressings or mayonnaise that are made with a vegetable oil. Limit added fats and oils that you use for cooking, baking, salads, and as spreads. °Other °Cocoa powder. Coffee and tea. All seasonings and condiments. °The items listed above may not be a complete list of recommended foods or beverages. Contact your dietitian for more options. °WHAT FOODS ARE NOT RECOMMENDED? °Grains °Breads that are made with saturated or trans fats, oils, or whole milk. Croissants. Butter rolls. Cheese breads. Sweet rolls. Donuts. Buttered popcorn. Chow mein noodles. High-fat crackers, such as cheese or butter crackers. °Meats and Other Protein Sources °Fatty meats, such as  hotdogs, short ribs, sausage, spareribs, bacon, ribeye roast or steak, and mutton. High-fat deli meats, such as salami and bologna. Caviar. Domestic duck and goose. Organ meats, such as kidney, liver, sweetbreads, brains, gizzard, chitterlings, and heart. °Dairy °Cream, sour cream, cream cheese, and creamed cottage cheese. Whole milk cheeses, including blue (bleu), Monterey Jack, Brie, [place], American, Havarti, Swiss, cheddar, Camembert, and [place].  Whole or 2% milk that is liquid, evaporated, or condensed. Whole buttermilk. Cream sauce or high-fat cheese sauce. Yogurt that is made from whole milk. °Beverages °Regular sodas and drinks with added sugar. °Sweets and Desserts °Frosting. Pudding. Cookies. Cakes other than angel food cake. Candy that has milk chocolate or white chocolate, hydrogenated fat, butter, coconut, or unknown ingredients. Buttered syrups. Full-fat ice cream or ice cream drinks. °Fats and Oils °Gravy that has suet, meat fat, or shortening. Cocoa butter, hydrogenated oils, palm oil, coconut oil, palm kernel oil. These can often be found in baked products, candy, fried foods, nondairy creamers, and whipped toppings. Solid fats and shortenings, including bacon fat, salt pork, lard, and butter. Nondairy cream substitutes, such as coffee creamers and sour cream substitutes. Salad dressings that are made of unknown oils, cheese, or sour cream. °The items listed above may not be a complete list of foods and beverages to avoid. Contact your dietitian for more information. °  °This information is not intended to replace advice given to you by your health care provider. Make sure you discuss any questions you have with your health   care provider.   Document Released: 01/11/2008 Document Revised: 04/24/2014 Document Reviewed: 09/25/2013 Elsevier Interactive Patient Education 2016 [employer].  Diabetes Mellitus and Food It is important for you to manage your blood sugar (glucose) level. Your blood  glucose level can be greatly affected by what you eat. Eating healthier foods in the appropriate amounts throughout the day at about the same time each day will help you control your blood glucose level. It can also help slow or prevent worsening of your diabetes mellitus. Healthy eating may even help you improve the level of your blood pressure and reach or maintain a healthy weight.  General recommendations for healthful eating and cooking habits include:  Eating meals and snacks regularly. Avoid going long periods of time without eating to lose weight.  Eating a diet that consists mainly of plant-based foods, such as fruits, vegetables, nuts, legumes, and whole grains.  Using low-heat cooking methods, such as baking, instead of high-heat cooking methods, such as deep frying. Work with your dietitian to make sure you understand how to use the Nutrition Facts information on food labels. HOW CAN FOOD AFFECT ME? Carbohydrates Carbohydrates affect your blood glucose level more than any other type of food. Your dietitian will help you determine how many carbohydrates to eat at each meal and teach you how to count carbohydrates. Counting carbohydrates is important to keep your blood glucose at a healthy level, especially if you are using insulin or taking certain medicines for diabetes mellitus. Alcohol Alcohol can cause sudden decreases in blood glucose (hypoglycemia), especially if you use insulin or take certain medicines for diabetes mellitus. Hypoglycemia can be a life-threatening condition. Symptoms of hypoglycemia (sleepiness, dizziness, and disorientation) are similar to symptoms of having too much alcohol.  If your health care provider has given you approval to drink alcohol, do so in moderation and use the following guidelines:  Women should not have more than one drink per day, and men should not have more than two drinks per day. One drink is equal to:  12 oz of beer.  5 oz of wine.  1  oz of hard liquor.  Do not drink on an empty stomach.  Keep yourself hydrated. Have water, diet soda, or unsweetened iced tea.  Regular soda, juice, and other mixers might contain a lot of carbohydrates and should be counted. WHAT FOODS ARE NOT RECOMMENDED? As you make food choices, it is important to remember that all foods are not the same. Some foods have fewer nutrients per serving than other foods, even though they might have the same number of calories or carbohydrates. It is difficult to get your body what it needs when you eat foods with fewer nutrients. Examples of foods that you should avoid that are high in calories and carbohydrates but low in nutrients include:  Trans fats (most processed foods list trans fats on the Nutrition Facts label).  Regular soda.  Juice.  Candy.  Sweets, such as cake, pie, doughnuts, and cookies.  Fried foods. WHAT FOODS CAN I EAT? Eat nutrient-rich foods, which will nourish your body and keep you healthy. The food you should eat also will depend on several factors, including:  The calories you need.  The medicines you take.  Your weight.  Your blood glucose level.  Your blood pressure level.  Your cholesterol level. You should eat a variety of foods, including:  Protein.  Lean cuts of meat.  Proteins low in saturated fats, such as fish, egg whites, and beans. Avoid  processed meats.  Fruits and vegetables.  Fruits and vegetables that may help control blood glucose levels, such as apples, mangoes, and yams.  Dairy products.  Choose fat-free or low-fat dairy products, such as milk, yogurt, and cheese.  Grains, bread, pasta, and rice.  Choose whole grain products, such as multigrain bread, whole oats, and brown rice. These foods may help control blood pressure.  Fats.  Foods containing healthful fats, such as nuts, avocado, olive oil, canola oil, and fish. DOES EVERYONE WITH DIABETES MELLITUS HAVE THE SAME MEAL  PLAN? Because every person with diabetes mellitus is different, there is not one meal plan that works for everyone. It is very important that you meet with a dietitian who will help you create a meal plan that is just right for you.   This information is not intended to replace advice given to you by your health care provider. Make sure you discuss any questions you have with your health care provider.   Document Released: 12/29/2004 Document Revised: 04/24/2014 Document Reviewed: 02/28/2013 Elsevier Interactive Patient Education 2016 [place].   Heart Attack A heart attack (myocardial infarction, MI) causes damage to the heart that cannot be fixed. A heart attack often happens when a blood clot or other blockage cuts blood flow to the heart. When this happens, certain areas of the heart begin to die. This causes the pain you feel during a heart attack. HOME CARE  Take medicine as told by your doctor. You may need medicine to:  Keep your blood from clotting too easily.  Control your blood pressure.  Lower your cholesterol.  Control abnormal heart rhythms.  Change certain behaviors as told by your doctor. This may include:  Quitting smoking.  Being active.  Eating a heart-healthy diet. Ask your doctor for help with this diet.  Keeping a healthy weight.  Keeping your diabetes under control.  Lessening stress.  Limiting how much alcohol you drink. Do not take these medicines unless your doctor says that you can:  Nonsteroidal anti-inflammatory drugs (NSAIDs). These include:  Ibuprofen.  Naproxen.  Celecoxib.  Vitamin supplements that have vitamin A, vitamin E, or both.  Hormone therapy that contains estrogen with or without progestin. GET HELP RIGHT AWAY IF:  You have sudden chest discomfort.  You have sudden discomfort in your:  Arms.  Back.  Neck.  Jaw.  You have shortness of breath at any time.  You have sudden sweating or clammy skin.  You  feel sick to your stomach (nauseous) or throw up (vomit).  You suddenly get light-headed or dizzy.  You feel your heart beating fast or skipping beats. These symptoms may be an emergency. Do not wait to see if the symptoms will go away. Get medical help right away. Call your local emergency services (911 in the [place] Do not drive yourself to the hospital.   This information is not intended to replace advice given to you by your health care provider. Make sure you discuss any questions you have with your health care provider.   Document Released: 10/03/2011 Document Revised: 08/18/2014 Document Reviewed: 06/06/2013 Elsevier Interactive Patient Education 2016 [place] After Refer to this sheet in the next few weeks. These instructions provide you with information about caring for yourself after your procedure. Your health care provider may also give you more specific instructions. Your treatment has been planned according to current medical practices, but problems sometimes occur. Call your health care provider if you have any problems  or questions after your procedure. WHAT TO EXPECT AFTER THE PROCEDURE After your procedure, it is typical to have the following:  Bruising at the catheter insertion site that usually fades within 1-2 weeks.  Blood collecting in the tissue (hematoma) that may be painful to the touch. It should usually decrease in size and tenderness within 1-2 weeks. HOME CARE INSTRUCTIONS  Take medicines only as directed by your health care provider.  You may shower 24-48 hours after the procedure or as directed by your health care provider. Remove the bandage (dressing) and gently wash the site with plain soap and water. Pat the area dry with a clean towel. Do not rub the site, because this may cause bleeding.  Do not take baths, swim, or use a hot tub until your health care provider approves.  Check your insertion site every day for redness,  swelling, or drainage.  Do not apply powder or lotion to the site.  Do not lift over 10 lb (4.5 kg) for 5 days after your procedure or as directed by your health care provider.  Ask your health care provider when it is okay to:  Return to work or school.  Resume usual physical activities or sports.  Resume sexual activity.  Do not drive home if you are discharged the same day as the procedure. Have someone else drive you.  You may drive 24 hours after the procedure unless otherwise instructed by your health care provider.  Do not operate machinery or power tools for 24 hours after the procedure or as directed by your health care provider.  If your procedure was done as an outpatient procedure, which means that you went home the same day as your procedure, a responsible adult should be with you for the first 24 hours after you arrive home.  Keep all follow-up visits as directed by your health care provider. This is important. SEEK MEDICAL CARE IF:  You have a fever.  You have chills.  You have increased bleeding from the catheter insertion site. Hold pressure on the site. SEEK IMMEDIATE MEDICAL CARE IF:  You have unusual pain at the catheter insertion site.  You have redness, warmth, or swelling at the catheter insertion site.  You have drainage (other than a small amount of blood on the dressing) from the catheter insertion site.  The catheter insertion site is bleeding, and the bleeding does not stop after 30 minutes of holding steady pressure on the site.  The area near or just beyond the catheter insertion site becomes pale, cool, tingly, or numb.   This information is not intended to replace advice given to you by your health care provider. Make sure you discuss any questions you have with your health care provider.   Document Released: 10/20/2004 Document Revised: 04/24/2014 Document Reviewed: 09/04/2012 Elsevier Interactive Patient Education [employer].

## 2015-08-09 ENCOUNTER — Encounter (HOSPITAL_COMMUNITY): Payer: Self-pay | Admitting: Interventional Cardiology

## 2015-08-09 MED FILL — Nitroglycerin IV Soln 100 MCG/ML in D5W: INTRA_ARTERIAL | Qty: 10 | Status: AC

## 2015-08-10 LAB — TYPE AND SCREEN
ABO/RH(D): A POS
Antibody Screen: NEGATIVE
Unit division: 0
Unit division: 0

## 2015-08-13 LAB — CUP PACEART REMOTE DEVICE CHECK
Brady Statistic AP VS Percent: 0.01 %
Brady Statistic AS VP Percent: 99.49 %
Brady Statistic AS VS Percent: 0.43 %
Brady Statistic RA Percent Paced: 0.08 %
Date Time Interrogation Session: 20170330052408
HIGH POWER IMPEDANCE MEASURED VALUE: 46 Ohm
HIGH POWER IMPEDANCE MEASURED VALUE: 60 Ohm
HighPow Impedance: 513 Ohm
Implantable Lead Implant Date: 20111003
Implantable Lead Implant Date: 20130509
Implantable Lead Location: 753859
Implantable Lead Model: 5076
Implantable Lead Model: 6947
Lead Channel Impedance Value: 418 Ohm
Lead Channel Impedance Value: 456 Ohm
Lead Channel Impedance Value: 684 Ohm
Lead Channel Pacing Threshold Amplitude: 0.875 V
Lead Channel Pacing Threshold Amplitude: 1.875 V
Lead Channel Pacing Threshold Pulse Width: 0.4 ms
Lead Channel Sensing Intrinsic Amplitude: 31.625 mV
Lead Channel Sensing Intrinsic Amplitude: 31.625 mV
Lead Channel Setting Pacing Amplitude: 2.5 V
Lead Channel Setting Pacing Amplitude: 2.75 V
Lead Channel Setting Pacing Pulse Width: 0.4 ms
MDC IDC LEAD IMPLANT DT: 20111003
MDC IDC LEAD LOCATION: 753858
MDC IDC LEAD LOCATION: 753860
MDC IDC LEAD MODEL: 4196
MDC IDC MSMT BATTERY VOLTAGE: 3.01 V
MDC IDC MSMT LEADCHNL LV IMPEDANCE VALUE: 399 Ohm
MDC IDC MSMT LEADCHNL RA PACING THRESHOLD PULSEWIDTH: 0.4 ms
MDC IDC MSMT LEADCHNL RA SENSING INTR AMPL: 1.625 mV
MDC IDC MSMT LEADCHNL RA SENSING INTR AMPL: 1.625 mV
MDC IDC MSMT LEADCHNL RV IMPEDANCE VALUE: 627 Ohm
MDC IDC MSMT LEADCHNL RV PACING THRESHOLD AMPLITUDE: 0.625 V
MDC IDC MSMT LEADCHNL RV PACING THRESHOLD PULSEWIDTH: 0.4 ms
MDC IDC SET LEADCHNL LV PACING AMPLITUDE: 2 V
MDC IDC SET LEADCHNL RV PACING PULSEWIDTH: 0.4 ms
MDC IDC SET LEADCHNL RV SENSING SENSITIVITY: 0.3 mV
MDC IDC STAT BRADY AP VP PERCENT: 0.07 %
MDC IDC STAT BRADY RV PERCENT PACED: 99.56 %

## 2015-08-16 ENCOUNTER — Ambulatory Visit (INDEPENDENT_AMBULATORY_CARE_PROVIDER_SITE_OTHER): Payer: Managed Care, Other (non HMO)

## 2015-08-16 DIAGNOSIS — Z95810 Presence of automatic (implantable) cardiac defibrillator: Secondary | ICD-10-CM | POA: Diagnosis not present

## 2015-08-16 DIAGNOSIS — I5022 Chronic systolic (congestive) heart failure: Secondary | ICD-10-CM

## 2015-08-16 NOTE — Progress Notes (Signed)
EPIC Encounter for ICM Monitoring  Patient Name: [name] is a 75 y.o. male Date: 08/16/2015 Primary Care Physican: Mayra Neer, MD Primary Cardiologist: Aundra Dubin Electrophysiologist: Allred Dry Weight: 203 lbs   Bi-V Pacing 98.2%      In the past month, have you:  1. Gained more than 2 pounds in a day or more than 5 pounds in a week? no  2. Had changes in your medications (with verification of current medications)? no  3. Had more shortness of breath than is usual for you? no  4. Limited your activity because of shortness of breath? no  5. Not been able to sleep because of shortness of breath? no  6. Had increased swelling in your feet or ankles? no  7. Had symptoms of dehydration (dizziness, dry mouth, increased thirst, decreased urine output) no  8. Had changes in sodium restriction? no  9. Been compliant with medication? Yes  ICM trend: 3 month view for 08/16/2015  ICM trend: 1 year view for 08/16/2015    Follow-up plan: ICM clinic phone appointment 09/16/2015.   Office appointment with CHF clinic on 08/24/2015 for post hospital follow up.    FLUID LEVELS: Optivol thoracic impedance decreased 07/24/2015 to 08/09/2015 suggesting fluid accumulation and returned to baseline 08/09/2015.   SYMPTOMS: None. He reported hospitalization from 08/01/2015 to 08/08/2015 due to chest pain.  He reported cardiac cath with stents were done.  He has been feeling fine since discharge.  Encouraged to call for any fluid symptoms.  No changes today.     [name], RN, CCM 08/16/2015 11:25 AM

## 2015-08-24 ENCOUNTER — Ambulatory Visit (HOSPITAL_COMMUNITY)
Admission: RE | Admit: 2015-08-24 | Discharge: 2015-08-24 | Disposition: A | Discharge status: Home / Self Care | Payer: Managed Care, Other (non HMO) | Source: Ambulatory Visit | Attending: Cardiology | Admitting: Cardiology

## 2015-08-24 VITALS — BP 110/58 | HR 77 | Wt 207.8 lb

## 2015-08-24 DIAGNOSIS — Z95810 Presence of automatic (implantable) cardiac defibrillator: Secondary | ICD-10-CM | POA: Diagnosis not present

## 2015-08-24 DIAGNOSIS — E1122 Type 2 diabetes mellitus with diabetic chronic kidney disease: Secondary | ICD-10-CM | POA: Diagnosis not present

## 2015-08-24 DIAGNOSIS — M109 Gout, unspecified: Secondary | ICD-10-CM | POA: Insufficient documentation

## 2015-08-24 DIAGNOSIS — Z951 Presence of aortocoronary bypass graft: Secondary | ICD-10-CM | POA: Diagnosis not present

## 2015-08-24 DIAGNOSIS — Z87891 Personal history of nicotine dependence: Secondary | ICD-10-CM | POA: Insufficient documentation

## 2015-08-24 DIAGNOSIS — Z79899 Other long term (current) drug therapy: Secondary | ICD-10-CM | POA: Diagnosis not present

## 2015-08-24 DIAGNOSIS — I255 Ischemic cardiomyopathy: Secondary | ICD-10-CM | POA: Insufficient documentation

## 2015-08-24 DIAGNOSIS — I5022 Chronic systolic (congestive) heart failure: Secondary | ICD-10-CM | POA: Insufficient documentation

## 2015-08-24 DIAGNOSIS — I214 Non-ST elevation (NSTEMI) myocardial infarction: Secondary | ICD-10-CM | POA: Diagnosis not present

## 2015-08-24 DIAGNOSIS — N183 Chronic kidney disease, stage 3 unspecified: Secondary | ICD-10-CM

## 2015-08-24 DIAGNOSIS — Z7982 Long term (current) use of aspirin: Secondary | ICD-10-CM | POA: Diagnosis not present

## 2015-08-24 DIAGNOSIS — I2510 Atherosclerotic heart disease of native coronary artery without angina pectoris: Secondary | ICD-10-CM | POA: Insufficient documentation

## 2015-08-24 DIAGNOSIS — Z794 Long term (current) use of insulin: Secondary | ICD-10-CM | POA: Insufficient documentation

## 2015-08-24 DIAGNOSIS — F329 Major depressive disorder, single episode, unspecified: Secondary | ICD-10-CM | POA: Insufficient documentation

## 2015-08-24 DIAGNOSIS — K219 Gastro-esophageal reflux disease without esophagitis: Secondary | ICD-10-CM | POA: Insufficient documentation

## 2015-08-24 DIAGNOSIS — N189 Chronic kidney disease, unspecified: Secondary | ICD-10-CM | POA: Diagnosis not present

## 2015-08-24 DIAGNOSIS — Z8249 Family history of ischemic heart disease and other diseases of the circulatory system: Secondary | ICD-10-CM | POA: Insufficient documentation

## 2015-08-24 DIAGNOSIS — Z7902 Long term (current) use of antithrombotics/antiplatelets: Secondary | ICD-10-CM | POA: Insufficient documentation

## 2015-08-24 DIAGNOSIS — E785 Hyperlipidemia, unspecified: Secondary | ICD-10-CM | POA: Diagnosis not present

## 2015-08-24 LAB — BASIC METABOLIC PANEL
ANION GAP: 14 (ref 5–15)
BUN: 19 mg/dL (ref 6–20)
CHLORIDE: 99 mmol/L — AB (ref 101–111)
CO2: 24 mmol/L (ref 22–32)
Calcium: 8.7 mg/dL — ABNORMAL LOW (ref 8.9–10.3)
Creatinine, Ser: 1.29 mg/dL — ABNORMAL HIGH (ref 0.61–1.24)
GFR calc Af Amer: >60 mL/min (ref >60)
GFR calc non Af Amer: 54 mL/min — ABNORMAL LOW (ref >60)
GLUCOSE: 218 mg/dL — AB (ref 65–99)
POTASSIUM: 4.2 mmol/L (ref 3.5–5.1)
Sodium: 137 mmol/L (ref 135–145)

## 2015-08-24 LAB — BRAIN NATRIURETIC PEPTIDE: B Natriuretic Peptide: 146.5 pg/mL — ABNORMAL HIGH (ref 0.0–100.0)

## 2015-08-24 MED ORDER — LISINOPRIL 5 MG PO TABS: 5.0000 mg | ORAL_TABLET | Freq: Every day | ORAL | Status: DC

## 2015-08-24 NOTE — Progress Notes (Signed)
Advanced Heart Failure Clinic Note   Patient ID: [name], male   DOB: 1940/10/24, 75 y.o.   MRN: [id number] PCP: Dr. Lynnda Child Primary HF: Dr Aundra Dubin  65 yo with history of CAD s/p CABG and redo CABG as well as ischemic cardiomyopathy with CRT-D device presents for CHF clinic evaluation.  He had a Cardiolite in the 4/15 showing lateral wall ischemia.  LHC at that time showed all his vein grafts from both CABG surgeries occluded.  The LIMA-LAD was patent and there was a 90% distal LM stenosis.  The lateral ischemia likely corresponded to LCx territory downstream from the LM stenosis.  PCI of the distal LM was thought to be high risk and characteristics of the lesion were not favorable for PCI.  Last echo showed EF 25-30% with moderate RV systolic dysfunction.  He had RHC in 11/15 with normal filling pressures and relatively preserved cardiac index (2.55).    In 4/17, he was admitted with chest pain concerning for unstable angina.  Angiography showed 85% distal LM with 90% ostial LCx stenosis.  LIMA was patent but proximal LAD, proximal RCA, and all SVGs were totally occluded. High had successful DES to LM and PTCA to ostial LCx with Impella support.  [name] was stopped and he was put back on low dose lisinopril due to symptomatic hypotension.   He seems to be doing well.  No further chest pain.  No exertional dyspnea walking on flat ground.  Has not been very active since getting home.  No orthopnea/PND.  No BRBPR/melena.  Weight is down 6 lbs.  Optivol checked today: Fluid index < threshold. Thoracic impedance stable.  Labs (9/15): LDL particle number 816, LDL 57, LFTs normal Labs (10/15): K 4.4, creatinine 1.4 Labs (11/15): K 4.3, creatinine 1.36 Labs (12/15): K 4.8, creatinine 1.34 Labs (3/16): K 4, creatinine 1.38, LDL 46, HDl 35 Labs (7/16): K 4, creatinine 1.39, BNP 211 Labs (03/03/15): K 4.1, creatinine 1.47, BNP 227, LDL 80, HDL 32 Labs (12/16): K 4.6, creatinine 1.12, LDL 68, HDL  34 Labs (4/17): K 3.4, creatinine 1.15  PMH: 1. Gout 2. Hyperlipidemia 3. CAD: CABG 1997 and redo 11/12.   - LHC (4/15) with totally occluded LAD, totally occluded RCA, 80% distal LM, 50% mLCx, 2 SVG-RCA grafts totally occluded, 2 SVG-OM grafts totally occluded, patent LIMA-LAD.  Cardiolite prior to 4/15 cath showed lateral wall ischemia (LCx territory).  PCI to distal LM would be a high risk procedure.   - Cardiolite (8/16) with EF 20%, severe scar in RCA and probably LCx territory, minimal peri-infarct ischemia.   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  - Unstable angina 4/17: 85% distal LM with 90% ostial LCx stenosis.  LIMA was patent but proximal LAD, proximal RCA, and all SVGs were totally occluded. High had successful DES to LM and PTCA to ostial LCx with Impella support 4. Ischemic Cardiomyopathy: Medtronic CRT-D device.   - Echo (6/15) with EF 25-30%, moderate LV dilation, inferior and inferolateral akinesis, moderately decreased RV systolic function, mild MR.   - RHC (11/15) with mean RA 5, PA 23/6, mean PCWP 9, CI 2.55.  - Echo (4/17): EF 25-30% with mild MR.  5. H/o cholecystectomy 6. OA 7. Depression 8. Type II diabetes 9. GERD 10. CKD  SH: Married, prior smoker (many years ago), lives in [place].    FH: CAD  ROS: All systems reviewed and negative except as per HPI.   Current Outpatient Prescriptions  Medication Sig Dispense Refill  . allopurinol (ZYLOPRIM) 100 MG tablet Take 100 mg by mouth 2 (two) times daily.     Marland Kitchen aspirin EC 81 MG tablet Take 81 mg by mouth daily.    Marland Kitchen atorvastatin (LIPITOR) 80 MG tablet Take 1 tablet (80 mg total) by mouth daily at 6 PM. 30 tablet 5  . carvedilol (COREG) 12.5 MG tablet Take 1 tablet (12.5 mg total) by mouth 2 (two) times daily with a meal. 30 tablet 5  . clopidogrel (PLAVIX) 75 MG tablet Take 1 tablet (75 mg total) by mouth daily. 90 tablet 3  . colchicine 0.6 MG tablet Take 1 tablet (0.6 mg  total) by mouth daily as needed (for gout flare). 30 tablet 6  . eplerenone (INSPRA) 25 MG tablet Take 1 tablet (25 mg total) by mouth every evening. 30 tablet 6  . furosemide (LASIX) 40 MG tablet Take 40 mg by mouth 2 (two) times daily.    . insulin lispro protamine-lispro (HUMALOG 50/50 MIX) (50-50) 100 UNIT/ML SUSP injection Inject 20-30 Units into the skin 3 (three) times daily. Takes 30 units in the morning 30 at lunch and 20 at night    . isosorbide mononitrate (IMDUR) 30 MG 24 hr tablet Take 1 tablet (30 mg total) by mouth at bedtime. 30 tablet 5  . lisinopril (PRINIVIL,ZESTRIL) 5 MG tablet Take 1 tablet (5 mg total) by mouth daily. 30 tablet 3  . nitroGLYCERIN (NITROSTAT) 0.4 MG SL tablet Place 1 tablet (0.4 mg total) under the tongue every 5 (five) minutes x 3 doses as needed. For chest pain. 25 tablet 3  . ONE TOUCH ULTRA TEST test strip     . ONETOUCH DELICA LANCETS [id number] MISC     . pantoprazole (PROTONIX) 40 MG tablet Take 1 tablet (40 mg total) by mouth daily. 30 tablet 11  . potassium chloride SA (K-DUR,KLOR-CON) 20 MEQ tablet Take 1 tablet (20 mEq total) by mouth daily. 30 tablet 5  . ranolazine (RANEXA) 1000 MG SR tablet Take 500 mg by mouth 2 (two) times daily.    Marland Kitchen venlafaxine (EFFEXOR-XR) 150 MG 24 hr capsule Take 150 mg by mouth at bedtime.     Marland Kitchen zolpidem (AMBIEN CR) 12.5 MG CR tablet Take 12.5 mg by mouth at bedtime as needed. For sleep     No current facility-administered medications for this encounter.   BP 110/58 mmHg  Pulse 77  Wt 207 lb 12 oz (94.235 kg)  SpO2 96%  General: NAD Neck: JVP 7 cm, no thyromegaly or nodule noted Lungs: CTAB, normal effort CV: Nondisplaced PMI.  Heart regular S1/S2, no S3/S4, no murmur. No edema.  No carotid bruit.  Normal pedal pulses.  Abdomen: Soft, NT, ND, no HSM.  Skin: Intact without lesions or rashes.  Neurologic: Alert and oriented x 3.  Psych: Normal affect. Extremities: No clubbing or cyanosis.   Assessment/Plan: 1. Chronic  systolic CHF: NYHA class II symptoms, stable.  EF 25-30% in 4/17 (stable). He has a Medtronic CRT-D device.  Volume status looks ok today by Optivol and by exam.   - Continue current eplerenone. - Off Entresto with low BP.  Now on lisinopril 2.5 mg daily.  I will increase lisinopril to 5 mg daily today.  BMET/BNP today and repeat BMET in 2 wks.  May be able to get him back on Entresto in the future.   - Continue Lasix 40 mg bid.   - Continue Coreg 12.5 mg bid.   2.  CAD: s/p CABG and redo. Had DES to distal LM, angioplasty to ostial LCx in 4/17.  No chest pain since that time.  - Continue ASA 81, Plavix, statin.  - Continue Imdur, Coreg, and ranolazine 500 mg bid.  3. CKD: BMET today. 4. Hyperlipidemia: check lipids/LFTs in 2 months.   5. Gout: No gout pain.   Follow up 2 months.    [name]   08/24/2015

## 2015-08-24 NOTE — Patient Instructions (Signed)
Increase Lisinopril to 5 mg daily  Labs today  Labs in 2 weeks  You have been referred to Cardiac Rehab at [facility], they will contact you to schedule  Your physician recommends that you schedule a follow-up appointment in: 2 months

## 2015-09-07 ENCOUNTER — Encounter: Payer: Self-pay | Admitting: Cardiology

## 2015-09-08 ENCOUNTER — Encounter: Payer: Self-pay | Admitting: Internal Medicine

## 2015-09-14 ENCOUNTER — Encounter (HOSPITAL_COMMUNITY): Payer: Managed Care, Other (non HMO)

## 2015-09-16 ENCOUNTER — Telehealth: Payer: Self-pay | Admitting: Cardiology

## 2015-09-16 ENCOUNTER — Ambulatory Visit (INDEPENDENT_AMBULATORY_CARE_PROVIDER_SITE_OTHER): Payer: Managed Care, Other (non HMO)

## 2015-09-16 DIAGNOSIS — Z95810 Presence of automatic (implantable) cardiac defibrillator: Secondary | ICD-10-CM | POA: Diagnosis not present

## 2015-09-16 DIAGNOSIS — I5022 Chronic systolic (congestive) heart failure: Secondary | ICD-10-CM | POA: Diagnosis not present

## 2015-09-16 NOTE — Telephone Encounter (Signed)
Spoke with pt and reminded pt of remote transmission that is due today. Pt verbalized understanding.   

## 2015-09-17 ENCOUNTER — Telehealth: Payer: Self-pay

## 2015-09-17 NOTE — Telephone Encounter (Signed)
Remote ICM transmission received.  Attempted patient call and mail box is full. 

## 2015-09-17 NOTE — Progress Notes (Signed)
EPIC Encounter for ICM Monitoring  Patient Name: [name] is a 75 y.o. male Date: 09/17/2015 Primary Care Physican: Mayra Neer, MD Primary Cardiologist: Aundra Dubin Electrophysiologist: Allred Dry Weight: unknown   Bi-V Pacing 97.5%      In the past month, have you:  1. Gained more than 2 pounds in a day or more than 5 pounds in a week? N/A  2. Had changes in your medications (with verification of current medications)? N/A  3. Had more shortness of breath than is usual for you? N/A  4. Limited your activity because of shortness of breath? N/A  5. Not been able to sleep because of shortness of breath? N/A  6. Had increased swelling in your feet or ankles? N/A  7. Had symptoms of dehydration (dizziness, dry mouth, increased thirst, decreased urine output) N/A  8. Had changes in sodium restriction? N/A  9. Been compliant with medication? N/A   ICM trend: 3 month view for 09/16/2015   ICM trend: 1 year view for 09/16/2015   Follow-up plan: ICM clinic phone appointment on 10/21/2015.  Attempted call to patient and unable to reach.  Transmission reviewed.  Thoracic impedance trending along baseline suggesting stable fluid levels.     [name], RN, CCM 09/17/2015 10:30 AM

## 2015-09-23 ENCOUNTER — Encounter (HOSPITAL_COMMUNITY): Payer: Managed Care, Other (non HMO)

## 2015-09-24 ENCOUNTER — Ambulatory Visit (HOSPITAL_COMMUNITY)
Admission: RE | Admit: 2015-09-24 | Discharge: 2015-09-24 | Disposition: A | Discharge status: Home / Self Care | Payer: Managed Care, Other (non HMO) | Source: Ambulatory Visit | Attending: Cardiology | Admitting: Cardiology

## 2015-09-24 VITALS — BP 120/60 | HR 89 | Ht 67.0 in | Wt 205.0 lb

## 2015-09-24 DIAGNOSIS — I214 Non-ST elevation (NSTEMI) myocardial infarction: Secondary | ICD-10-CM

## 2015-09-24 DIAGNOSIS — Z955 Presence of coronary angioplasty implant and graft: Secondary | ICD-10-CM | POA: Insufficient documentation

## 2015-09-24 NOTE — Progress Notes (Signed)
Cardiac Individual Treatment Plan  Patient Details  Name: [name] MRN: [id number] Date of Birth: 1940/09/26 Referring Provider:        CARDIAC REHAB PHASE II EXERCISE from 09/24/2015 in [place]   Referring Provider  Dr. Aundra Dubin      Initial Encounter Date:       CARDIAC REHAB PHASE II EXERCISE from 09/24/2015 in [place]   Date  09/24/15   Referring Provider  Dr. Aundra Dubin      Visit Diagnosis: NSTEMI (non-ST elevated myocardial infarction) [facility])  Stented coronary artery  Patient's Home Medications on Admission:  Current outpatient prescriptions:  .  allopurinol (ZYLOPRIM) 100 MG tablet, Take 100 mg by mouth 2 (two) times daily. , Disp: , Rfl:  .  aspirin EC 81 MG tablet, Take 81 mg by mouth daily., Disp: , Rfl:  .  atorvastatin (LIPITOR) 80 MG tablet, Take 1 tablet (80 mg total) by mouth daily at 6 PM., Disp: 30 tablet, Rfl: 5 .  carvedilol (COREG) 12.5 MG tablet, Take 1 tablet (12.5 mg total) by mouth 2 (two) times daily with a meal., Disp: 30 tablet, Rfl: 5 .  clopidogrel (PLAVIX) 75 MG tablet, Take 1 tablet (75 mg total) by mouth daily., Disp: 90 tablet, Rfl: 3 .  colchicine 0.6 MG tablet, Take 1 tablet (0.6 mg total) by mouth daily as needed (for gout flare)., Disp: 30 tablet, Rfl: 6 .  eplerenone (INSPRA) 25 MG tablet, Take 1 tablet (25 mg total) by mouth every evening., Disp: 30 tablet, Rfl: 6 .  furosemide (LASIX) 40 MG tablet, Take 40 mg by mouth 2 (two) times daily., Disp: , Rfl:  .  insulin lispro protamine-lispro (HUMALOG 50/50 MIX) (50-50) 100 UNIT/ML SUSP injection, Inject 20-30 Units into the skin 3 (three) times daily. Takes 30 units in the morning 30 at lunch and 20 at night, Disp: , Rfl:  .  isosorbide mononitrate (IMDUR) 30 MG 24 hr tablet, Take 1 tablet (30 mg total) by mouth at bedtime., Disp: 30 tablet, Rfl: 5 .  lisinopril (PRINIVIL,ZESTRIL) 5 MG tablet, Take 1 tablet (5 mg total) by mouth daily., Disp: 30  tablet, Rfl: 3 .  nitroGLYCERIN (NITROSTAT) 0.4 MG SL tablet, Place 1 tablet (0.4 mg total) under the tongue every 5 (five) minutes x 3 doses as needed. For chest pain., Disp: 25 tablet, Rfl: 3 .  ONE TOUCH ULTRA TEST test strip, , Disp: , Rfl:  .  ONETOUCH DELICA LANCETS [id number] MISC, , Disp: , Rfl:  .  pantoprazole (PROTONIX) 40 MG tablet, Take 1 tablet (40 mg total) by mouth daily., Disp: 30 tablet, Rfl: 11 .  potassium chloride SA (K-DUR,KLOR-CON) 20 MEQ tablet, Take 1 tablet (20 mEq total) by mouth daily., Disp: 30 tablet, Rfl: 5 .  ranolazine (RANEXA) 1000 MG SR tablet, Take 500 mg by mouth 2 (two) times daily., Disp: , Rfl:  .  venlafaxine (EFFEXOR-XR) 150 MG 24 hr capsule, Take 150 mg by mouth at bedtime. , Disp: , Rfl:  .  zolpidem (AMBIEN CR) 12.5 MG CR tablet, Take 12.5 mg by mouth at bedtime as needed. For sleep, Disp: , Rfl:   Past Medical History: Past Medical History  Diagnosis Date  . Ischemic cardiomyopathy     s/p ICD Implantation by Dr Leonia Reeves  . Chronic systolic dysfunction of left ventricle     EF 30%  . Hyperlipemia   . DJD (degenerative joint disease)   . Gout   .  HTN (hypertension) 02/14/2011  . Depression   . Angina   . Myocardial infarction ([place]) 1997  . Cardiac defibrillator in situ   . DM (diabetes mellitus) ([place]) 02/14/2011  . CHF (congestive heart failure) ([place])     EF 30% by cath 05/2011  . ICD (implantable cardiac defibrillator) in place   . GERD (gastroesophageal reflux disease)   . Heart attack ([place])   . Complication of anesthesia     ONCE WITH BACK SURGERY DIFFICULT TO WAKE UP  . CAD (coronary artery disease)     s/p CABG 1997, PCI (BMS) of SVG to RCA 3/09, redo CABG 02/2011 with SVG to PDA, SVG to Lcx, s/p cath 2.2013 with ocluded SVT to left circ and patent SVG to RCA.  Cath 07/2013 Recent cath showed ischemic cardiomyopathy with LVEF less than 20%, with progressive LV dysfunction related to bypass graft failure, occlusion of the saphenous vein grafts  placed in 2012 to the right coronary and to the circum  . Paroxysmal atrial fibrillation (HCC) 10/22/14    single episode of AF x 3 hours 48 minutes recorded on ICD, chads2vasc score is at least 5     Tobacco Use: History  Smoking status  . Former Smoker -- 0.50 packs/day for 15 years  . Types: Cigarettes  . Quit date: 04/17/1969  Smokeless tobacco  . Former Systems developer  . Quit date: 05/05/1971    Labs:     Recent Review Flowsheet Data    Labs for ITP Cardiac and Pulmonary Rehab Latest Ref Rng 12/23/2013 02/17/2014 02/17/2014 06/29/2014 02/17/2015   Cholestrol 0 - 200 mg/dL 120 - - 116 147   LDLCALC 0 - 99 mg/dL 57 - - 46 80   HDL >40 mg/dL 35(L) - - 35.50(L) 32(L)   Trlycerides <150 mg/dL 142 - - 174.0(H) 176(H)   PHART 7.350 - 7.450 - - 7.412 - -   PCO2ART 35.0 - 45.0 mmHg - - 40.2 - -   HCO3 20.0 - 24.0 mEq/L - 26.3(H) 25.6(H) - -   TCO2 0 - 100 mmol/L - 28 27 - -   O2SAT - - 71.0 98.0 - -      Capillary Blood Glucose: Lab Results  Component Value Date   GLUCAP 242* 08/08/2015   GLUCAP 148* 08/08/2015   GLUCAP 241* 08/07/2015   GLUCAP 238* 08/07/2015   GLUCAP 205* 08/07/2015     Exercise Target Goals: Date: 09/24/15  Exercise Program Goal: Individual exercise prescription set with THRR, safety & activity barriers. Participant demonstrates ability to understand and report RPE using BORG scale, to self-measure pulse accurately, and to acknowledge the importance of the exercise prescription.  Exercise Prescription Goal: Starting with aerobic activity 30 plus minutes a day, 3 days per week for initial exercise prescription. Provide home exercise prescription and guidelines that participant acknowledges understanding prior to discharge.  Activity Barriers & Risk Stratification:     Activity Barriers & Cardiac Risk Stratification - 09/24/15 1539    Activity Barriers & Cardiac Risk Stratification   Activity Barriers None   Cardiac Risk Stratification High      6 Minute  Walk:     6 Minute Walk      09/24/15 1449       6 Minute Walk   Phase Initial     Distance 1025 feet     Walk Time 6 minutes     # of Rest Breaks 0     MPH 1.94     METS 2.48  RPE 11     Perceived Dyspnea  11     VO2 Peak 8.19     Symptoms No     Resting HR 89 bpm     Resting BP 120/60 mmHg     Max Ex. HR 114 bpm     Max Ex. BP 156/66 mmHg     2 Minute Post BP 124/64 mmHg        Initial Exercise Prescription:     Initial Exercise Prescription - 09/24/15 1400    Date of Initial Exercise RX and Referring Provider   Date 09/24/15   Referring Provider Dr. Aundra Dubin   Treadmill   MPH 1.9   Grade 0   Minutes 15   METs 1.9   NuStep   Level 2   Watts 22   Minutes 15   METs 1.9   Arm Ergometer   Level 2   Watts 23   Minutes 15   METs 2   Prescription Details   Frequency (times per week) 3   Duration Progress to 30 minutes of continuous aerobic without signs/symptoms of physical distress   Intensity   THRR REST +  30   THRR 40-80% of Max Heartrate [phone]   Ratings of Perceived Exertion 11-13   Perceived Dyspnea 0-4   Progression   Progression Continue to progress workloads to maintain intensity without signs/symptoms of physical distress.   Resistance Training   Training Prescription Yes   Weight 1   Reps 10-12      Perform Capillary Blood Glucose checks as needed.  Exercise Prescription Changes:   Exercise Comments:    Discharge Exercise Prescription (Final Exercise Prescription Changes):   Nutrition:  Target Goals: Understanding of nutrition guidelines, daily intake of sodium 1500mg , cholesterol 200mg , calories 30% from fat and 7% or less from saturated fats, daily to have 5 or more servings of fruits and vegetables.  Biometrics:     Pre Biometrics - 09/24/15 1452    Pre Biometrics   Height 5\' 7"  (1.702 m)   Weight 205 lb 0.4 oz (93 kg)   Waist Circumference 43 inches   Hip Circumference 43 inches   Waist to Hip Ratio 1 %   BMI  (Calculated) 32.2   Triceps Skinfold 15 mm   % Body Fat 30.9 %   Grip Strength 70 kg   Flexibility 13.7 in   Single Leg Stand 3 seconds       Nutrition Therapy Plan and Nutrition Goals:   Nutrition Discharge: Rate Your Plate Scores:     Nutrition Assessments - 09/24/15 1546    MEDFICTS Scores   Pre Score 27      Nutrition Goals Re-Evaluation:   Psychosocial: Target Goals: Acknowledge presence or absence of depression, maximize coping skills, provide positive support system. Participant is able to verbalize types and ability to use techniques and skills needed for reducing stress and depression.  Initial Review & Psychosocial Screening:     Initial Psych Review & Screening - 09/24/15 1649    Initial Review   Current issues with --  No abuse or negelect issues.    Family Dynamics   Good Support System? Yes   Barriers   Psychosocial barriers to participate in program There are no identifiable barriers or psychosocial needs.   Screening Interventions   Interventions Encouraged to exercise      Quality of Life Scores:     Quality of Life - 09/24/15 1454    Quality of Life Scores  Health/Function Pre 24.17 %   Socioeconomic Pre 25.5 %   Psych/Spiritual Pre 24.14 %   Family Pre 27.75 %   GLOBAL Pre 24.86 %      PHQ-9:     Recent Review Flowsheet Data    Depression screen [facility] 2/9 09/24/2015   Decreased Interest 0   Down, Depressed, Hopeless 1   PHQ - 2 Score 1   Altered sleeping 2   Tired, decreased energy 2   Change in appetite 3   Feeling bad or failure about yourself  1   Trouble concentrating 0   Moving slowly or fidgety/restless 1   Suicidal thoughts 0   PHQ-9 Score 10   Difficult doing work/chores Somewhat difficult      Psychosocial Evaluation and Intervention:     Psychosocial Evaluation - 09/24/15 1650    Psychosocial Evaluation & Interventions   Interventions Encouraged to exercise with the program and follow exercise  prescription;Stress management education;Relaxation education   Comments Patient is already seeing an counselor for his depression   Continued Psychosocial Services Needed Yes      Psychosocial Re-Evaluation:   Vocational Rehabilitation: Provide vocational rehab assistance to qualifying candidates.   Vocational Rehab Evaluation & Intervention:     Vocational Rehab - 09/24/15 1541    Initial Vocational Rehab Evaluation & Intervention   Assessment shows need for Vocational Rehabilitation No      Education: Education Goals: Education classes will be provided on a weekly basis, covering required topics. Participant will state understanding/return demonstration of topics presented.  Learning Barriers/Preferences:     Learning Barriers/Preferences - 09/24/15 1539    Learning Barriers/Preferences   Learning Barriers None   Learning Preferences Skilled Demonstration      Education Topics: Hypertension, Hypertension Reduction -Define heart disease and high blood pressure. Discus how high blood pressure affects the body and ways to reduce high blood pressure.   Exercise and Your Heart -Discuss why it is important to exercise, the FITT principles of exercise, normal and abnormal responses to exercise, and how to exercise safely.   Angina -Discuss definition of angina, causes of angina, treatment of angina, and how to decrease risk of having angina.   Cardiac Medications -Review what the following cardiac medications are used for, how they affect the body, and side effects that may occur when taking the medications.  Medications include Aspirin, Beta blockers, calcium channel blockers, ACE Inhibitors, angiotensin receptor blockers, diuretics, digoxin, and antihyperlipidemics.   Congestive Heart Failure -Discuss the definition of CHF, how to live with CHF, the signs and symptoms of CHF, and how keep track of weight and sodium intake.   Heart Disease and Intimacy -Discus the  effect sexual activity has on the heart, how changes occur during intimacy as we age, and safety during sexual activity.   Smoking Cessation / COPD -Discuss different methods to quit smoking, the health benefits of quitting smoking, and the definition of COPD.   Nutrition I: Fats -Discuss the types of cholesterol, what cholesterol does to the heart, and how cholesterol levels can be controlled.   Nutrition II: Labels -Discuss the different components of food labels and how to read food label   Heart Parts and Heart Disease -Discuss the anatomy of the heart, the pathway of blood circulation through the heart, and these are affected by heart disease.   Stress I: Signs and Symptoms -Discuss the causes of stress, how stress may lead to anxiety and depression, and ways to limit stress.   Stress  II: Relaxation -Discuss different types of relaxation techniques to limit stress.   Warning Signs of Stroke / TIA -Discuss definition of a stroke, what the signs and symptoms are of a stroke, and how to identify when someone is having stroke.   Knowledge Questionnaire Score:     Knowledge Questionnaire Score - 09/24/15 1540    Knowledge Questionnaire Score   Pre Score 21/24      Core Components/Risk Factors/Patient Goals at Admission:     Personal Goals and Risk Factors at Admission - 09/24/15 1546    Core Components/Risk Factors/Patient Goals on Admission    Weight Management Weight Maintenance   Increase Strength and Stamina Yes   Intervention Provide advice, education, support and counseling about physical activity/exercise needs.;Develop an individualized exercise prescription for aerobic and resistive training based on initial evaluation findings, risk stratification, comorbidities and participant's personal goals.   Diabetes Yes   Intervention Provide education about signs/symptoms and action to take for hypo/hyperglycemia.   Expected Outcomes Long Term: Attainment of HbA1C <  7%.;Short Term: Participant verbalizes understanding of the signs/symptoms and immediate care of hyper/hypoglycemia, proper foot care and importance of medication, aerobic/resistive exercise and nutrition plan for blood glucose control.   Personal Goal Other Yes   Personal Goal Get stronger, be around the people in the program.    Intervention Exercise 3 times week in program and supplement 2 more days at home.    Expected Outcomes Get stonger and gain new friends.       Core Components/Risk Factors/Patient Goals Review:    Core Components/Risk Factors/Patient Goals at Discharge (Final Review):    ITP Comments:   Comments: Patient arrived for 1st visit/orientation/education at 1230. Patient was referred to CR by Dr. Aundra Dubin due to NSTEMI (121.4) and Stent Placement (Z95.5). During orientation advised patient on arrival and appointment times what to wear, what to do before, during and after exercise. Reviewed attendance and class policy. Talked about inclement weather and class consultation policy. Pt is scheduled to return Cardiac Rehab on 09/27/15 at 1100. Pt was advised to come to class 15 minutes before class starts. He was also given instructions on meeting with the dietician and attending the Family Structure classes. Pt is eager to get started. Patient was able to complete 6 minute walk test. Patient was measured for the equipment. Discussed equipment safety with patient. Took patient pre-anthropometric measurements. Patient finished visit at 14:51.

## 2015-09-24 NOTE — Progress Notes (Signed)
Cardiac/Pulmonary Rehab Medication Review by a Pharmacist  Does the patient  feel that his/her medications are working for him/her?  yes  Has the patient been experiencing any side effects to the medications prescribed?  no  Does the patient measure his/her own blood pressure or blood glucose at home?  yes   Does the patient have any problems obtaining medications due to transportation or finances?   no  Understanding of regimen: excellent Understanding of indications: excellent Potential of compliance: excellent  Mr Tsung uses a pill box and his wife helps him with his medications.  He has excellent potential for compliance.  Excell Seltzer [place]et 09/24/2015 1:43 PM

## 2015-09-27 ENCOUNTER — Encounter (HOSPITAL_COMMUNITY)
Admission: RE | Admit: 2015-09-27 | Discharge: 2015-09-27 | Disposition: A | Discharge status: Home / Self Care | Payer: Managed Care, Other (non HMO) | Source: Ambulatory Visit | Attending: Cardiology | Admitting: Cardiology

## 2015-09-27 DIAGNOSIS — I480 Paroxysmal atrial fibrillation: Secondary | ICD-10-CM | POA: Diagnosis not present

## 2015-09-27 DIAGNOSIS — Z955 Presence of coronary angioplasty implant and graft: Secondary | ICD-10-CM | POA: Diagnosis present

## 2015-09-27 DIAGNOSIS — E119 Type 2 diabetes mellitus without complications: Secondary | ICD-10-CM | POA: Diagnosis not present

## 2015-09-27 DIAGNOSIS — Z95810 Presence of automatic (implantable) cardiac defibrillator: Secondary | ICD-10-CM | POA: Diagnosis not present

## 2015-09-27 DIAGNOSIS — E785 Hyperlipidemia, unspecified: Secondary | ICD-10-CM | POA: Diagnosis not present

## 2015-09-27 DIAGNOSIS — I509 Heart failure, unspecified: Secondary | ICD-10-CM | POA: Diagnosis not present

## 2015-09-27 DIAGNOSIS — I255 Ischemic cardiomyopathy: Secondary | ICD-10-CM | POA: Insufficient documentation

## 2015-09-27 DIAGNOSIS — M109 Gout, unspecified: Secondary | ICD-10-CM | POA: Diagnosis not present

## 2015-09-27 DIAGNOSIS — I209 Angina pectoris, unspecified: Secondary | ICD-10-CM | POA: Insufficient documentation

## 2015-09-27 DIAGNOSIS — F329 Major depressive disorder, single episode, unspecified: Secondary | ICD-10-CM | POA: Diagnosis not present

## 2015-09-27 DIAGNOSIS — I110 Hypertensive heart disease with heart failure: Secondary | ICD-10-CM | POA: Diagnosis not present

## 2015-09-27 DIAGNOSIS — Z87891 Personal history of nicotine dependence: Secondary | ICD-10-CM | POA: Diagnosis not present

## 2015-09-27 DIAGNOSIS — I2510 Atherosclerotic heart disease of native coronary artery without angina pectoris: Secondary | ICD-10-CM | POA: Insufficient documentation

## 2015-09-27 DIAGNOSIS — K219 Gastro-esophageal reflux disease without esophagitis: Secondary | ICD-10-CM | POA: Insufficient documentation

## 2015-09-27 DIAGNOSIS — Z794 Long term (current) use of insulin: Secondary | ICD-10-CM | POA: Insufficient documentation

## 2015-09-27 DIAGNOSIS — M1990 Unspecified osteoarthritis, unspecified site: Secondary | ICD-10-CM | POA: Diagnosis not present

## 2015-09-27 DIAGNOSIS — I214 Non-ST elevation (NSTEMI) myocardial infarction: Secondary | ICD-10-CM | POA: Diagnosis present

## 2015-09-27 DIAGNOSIS — Z7982 Long term (current) use of aspirin: Secondary | ICD-10-CM | POA: Insufficient documentation

## 2015-09-27 DIAGNOSIS — Z79899 Other long term (current) drug therapy: Secondary | ICD-10-CM | POA: Insufficient documentation

## 2015-09-27 NOTE — Progress Notes (Signed)
Daily Session Note  Patient Details  Name: [name] MRN: [id number] Date of Birth: 1940-06-17 Referring Provider:        CARDIAC REHAB PHASE II EXERCISE from 09/24/2015 in [place]   Referring Provider  Dr. Aundra Dubin      Encounter Date: 09/27/2015  Check In:     Session Check In - 09/27/15 1051    Check-In   Location [facility]   Staff Present Diane Angelina Pih, MS, EP, [facility], Exercise Physiologist;Aison Malveaux Luther Parody, BS, EP, Exercise Physiologist;Christy Oletta Lamas, RN, BSN   Supervising physician immediately available to respond to emergencies See telemetry face sheet for immediately available MD   Medication changes reported     No   Fall or balance concerns reported    No   Warm-up and Cool-down Performed as group-led instruction   Resistance Training Performed Yes   VAD Patient? No   Pain Assessment   Currently in Pain? No/denies   Pain Score 0-No pain   Multiple Pain Sites No      Capillary Blood Glucose: No results found for this or any previous visit (from the past 24 hour(s)).   Goals Met:  Independence with exercise equipment Exercise tolerated well No report of cardiac concerns or symptoms Strength training completed today  Goals Unmet:  Not Applicable  Comments: Check out 1200   Dr. Kate Sable is Medical Director for [place] and Pulmonary Rehab.

## 2015-09-29 ENCOUNTER — Encounter (HOSPITAL_COMMUNITY)
Admission: RE | Admit: 2015-09-29 | Discharge: 2015-09-29 | Disposition: A | Discharge status: Home / Self Care | Payer: Managed Care, Other (non HMO) | Source: Ambulatory Visit | Attending: Cardiology | Admitting: Cardiology

## 2015-09-29 DIAGNOSIS — Z955 Presence of coronary angioplasty implant and graft: Secondary | ICD-10-CM | POA: Diagnosis not present

## 2015-09-29 NOTE — Progress Notes (Signed)
Daily Session Note  Patient Details  Name: ISSAAC SHIPPER MRN: [id number] Date of Birth: Aug 12, 1940 Referring Provider:        CARDIAC REHAB PHASE II EXERCISE from 09/24/2015 in [place]   Referring Provider  Dr. Aundra Dubin      Encounter Date: 09/29/2015  Check In:     Session Check In - 09/29/15 1100    Check-In   Location [facility]   Staff Present Diane Angelina Pih, MS, EP, [facility], Exercise Physiologist;Orli Degrave Luther Parody, BS, EP, Exercise Physiologist;Christy Oletta Lamas, RN, BSN   Supervising physician immediately available to respond to emergencies See telemetry face sheet for immediately available MD   Medication changes reported     No   Fall or balance concerns reported    No   Warm-up and Cool-down Performed as group-led instruction   Resistance Training Performed Yes   VAD Patient? No   Pain Assessment   Currently in Pain? No/denies   Pain Score 0-No pain   Multiple Pain Sites No      Capillary Blood Glucose: No results found for this or any previous visit (from the past 24 hour(s)).   Goals Met:  Independence with exercise equipment Exercise tolerated well No report of cardiac concerns or symptoms Strength training completed today  Goals Unmet:  Not Applicable  Comments: Check out 1200   Dr. Kate Sable is Medical Director for [facility] and Pulmonary Rehab.

## 2015-10-01 ENCOUNTER — Encounter (HOSPITAL_COMMUNITY): Payer: Managed Care, Other (non HMO)

## 2015-10-04 ENCOUNTER — Other Ambulatory Visit: Payer: Self-pay

## 2015-10-04 ENCOUNTER — Encounter: Payer: Self-pay | Admitting: Endocrinology

## 2015-10-04 ENCOUNTER — Other Ambulatory Visit (INDEPENDENT_AMBULATORY_CARE_PROVIDER_SITE_OTHER): Payer: Managed Care, Other (non HMO)

## 2015-10-04 ENCOUNTER — Encounter: Payer: Managed Care, Other (non HMO) | Admitting: Endocrinology

## 2015-10-04 ENCOUNTER — Encounter (HOSPITAL_COMMUNITY)
Admission: RE | Admit: 2015-10-04 | Discharge: 2015-10-04 | Disposition: A | Discharge status: Home / Self Care | Payer: Managed Care, Other (non HMO) | Source: Ambulatory Visit | Attending: Cardiology | Admitting: Cardiology

## 2015-10-04 ENCOUNTER — Telehealth: Payer: Self-pay | Admitting: Endocrinology

## 2015-10-04 DIAGNOSIS — Z955 Presence of coronary angioplasty implant and graft: Secondary | ICD-10-CM | POA: Diagnosis not present

## 2015-10-04 DIAGNOSIS — E119 Type 2 diabetes mellitus without complications: Secondary | ICD-10-CM

## 2015-10-04 LAB — COMPREHENSIVE METABOLIC PANEL
ALT: 16 U/L (ref 0–53)
AST: 14 U/L (ref 0–37)
Albumin: 3.7 g/dL (ref 3.5–5.2)
Alkaline Phosphatase: 97 U/L (ref 39–117)
BUN: 28 mg/dL — ABNORMAL HIGH (ref 6–23)
CO2: 23 meq/L (ref 19–32)
Calcium: 8.8 mg/dL (ref 8.4–10.5)
Chloride: 101 mEq/L (ref 96–112)
Creatinine, Ser: 1.67 mg/dL — ABNORMAL HIGH (ref 0.40–1.50)
GFR: 43.07 mL/min — AB (ref >60.00)
GLUCOSE: 156 mg/dL — AB (ref 70–99)
POTASSIUM: 3.8 meq/L (ref 3.5–5.1)
SODIUM: 137 meq/L (ref 135–145)
Total Bilirubin: 0.5 mg/dL (ref 0.2–1.2)
Total Protein: 6.8 g/dL (ref 6.0–8.3)

## 2015-10-04 LAB — HEMOGLOBIN A1C: HEMOGLOBIN A1C: 9.2 % — AB (ref 4.6–6.5)

## 2015-10-04 MED ORDER — CAREFINE PEN NEEDLES 32G X 4 MM MISC: Status: DC

## 2015-10-04 MED ORDER — ONETOUCH ULTRA BLUE VI STRP: ORAL_STRIP | Status: DC

## 2015-10-04 MED ORDER — INSULIN LISPRO PROT & LISPRO (50-50 MIX) 100 UNIT/ML ~~LOC~~ SUSP: 20.0000 [IU] | Freq: Three times a day (TID) | SUBCUTANEOUS | Status: DC

## 2015-10-04 NOTE — Progress Notes (Signed)
Daily Session Note  Patient Details  Name: [name] MRN: [id number] Date of Birth: 07/21/1940 Referring Provider:        CARDIAC REHAB PHASE II EXERCISE from 09/24/2015 in [place] [facility]   Referring Provider  Dr. Mclean      Encounter Date: 10/04/2015  Check In:     Session Check In - 10/04/15 1100    Check-In   Location [facility]   Staff Present Diane Coad, MS, EP, [facility], Exercise Physiologist;Kaytie Ratcliffe, BS, EP, Exercise Physiologist;Christy Edwards, RN, BSN   Supervising physician immediately available to respond to emergencies See telemetry face sheet for immediately available MD   Medication changes reported     No   Fall or balance concerns reported    No   Warm-up and Cool-down Performed as group-led instruction   Resistance Training Performed Yes   VAD Patient? No   Pain Assessment   Currently in Pain? No/denies   Pain Score 0-No pain   Multiple Pain Sites No      Capillary Blood Glucose: No results found for this or any previous visit (from the past 24 hour(s)).   Goals Met:  Independence with exercise equipment Exercise tolerated well No report of cardiac concerns or symptoms Strength training completed today  Goals Unmet:  Not Applicable  Comments: Check out 1200   Dr. Suresh Koneswaran is Medical Director for [facility] and Pulmonary Rehab. 

## 2015-10-04 NOTE — Telephone Encounter (Signed)
Rx for test strips and insulin sent.

## 2015-10-04 NOTE — Telephone Encounter (Signed)
Pt calling to talk to [facility], said he was told to call back when he got home today

## 2015-10-04 NOTE — Telephone Encounter (Signed)
Pt has one touch ultra 2 meter and the strips are the one touch ultra strips gets 50/50 insulin

## 2015-10-04 NOTE — Telephone Encounter (Signed)
Rx for Humalog 50/50 and pen needles.

## 2015-10-05 ENCOUNTER — Telehealth: Payer: Self-pay | Admitting: Endocrinology

## 2015-10-05 ENCOUNTER — Other Ambulatory Visit: Payer: Self-pay

## 2015-10-05 MED ORDER — INSULIN PEN NEEDLE 32G X 4 MM MISC: Status: DC

## 2015-10-05 NOTE — Telephone Encounter (Signed)
Rx submitted per pt's request.  

## 2015-10-05 NOTE — Telephone Encounter (Signed)
Dominique from [facility] ask if she can change patient prescription from [employer] needle to [employer] needled, please advise  [facility] [place], [place] - [address] [place] (Phone) [phone] (Fax)

## 2015-10-05 NOTE — Progress Notes (Signed)
This encounter was created in error - please disregard.

## 2015-10-06 ENCOUNTER — Encounter (HOSPITAL_COMMUNITY)
Admission: RE | Admit: 2015-10-06 | Discharge: 2015-10-06 | Disposition: A | Discharge status: Home / Self Care | Payer: Managed Care, Other (non HMO) | Source: Ambulatory Visit | Attending: Cardiology | Admitting: Cardiology

## 2015-10-06 DIAGNOSIS — Z955 Presence of coronary angioplasty implant and graft: Secondary | ICD-10-CM | POA: Diagnosis not present

## 2015-10-06 NOTE — Progress Notes (Signed)
Daily Session Note  Patient Details  Name: [name] MRN: [id number] Date of Birth: 05/25/1940 Referring Provider:        CARDIAC REHAB PHASE II EXERCISE from 09/24/2015 in [place]   Referring Provider  Dr. Aundra Dubin      Encounter Date: 10/06/2015  Check In:     Session Check In - 10/06/15 1100    Check-In   Location [facility]   Staff Present Diane Angelina Pih, MS, EP, [facility], Exercise Physiologist;Jaison Petraglia Luther Parody, BS, EP, Exercise Physiologist   Supervising physician immediately available to respond to emergencies See telemetry face sheet for immediately available MD   Medication changes reported     No   Fall or balance concerns reported    No   Warm-up and Cool-down Performed as group-led instruction   Resistance Training Performed Yes   VAD Patient? No   Pain Assessment   Currently in Pain? No/denies   Pain Score 0-No pain   Multiple Pain Sites No      Capillary Blood Glucose: No results found for this or any previous visit (from the past 24 hour(s)).   Goals Met:  Independence with exercise equipment Exercise tolerated well No report of cardiac concerns or symptoms Strength training completed today  Goals Unmet:  Not Applicable  Comments: Check out 1200   Dr. Kate Sable is Medical Director for [place] and Pulmonary Rehab.

## 2015-10-07 ENCOUNTER — Ambulatory Visit (INDEPENDENT_AMBULATORY_CARE_PROVIDER_SITE_OTHER): Payer: Managed Care, Other (non HMO) | Admitting: Endocrinology

## 2015-10-07 VITALS — BP 81/56 | HR 97 | Ht 70.0 in | Wt 198.0 lb

## 2015-10-07 DIAGNOSIS — R634 Abnormal weight loss: Secondary | ICD-10-CM

## 2015-10-07 DIAGNOSIS — I255 Ischemic cardiomyopathy: Secondary | ICD-10-CM

## 2015-10-07 DIAGNOSIS — Z794 Long term (current) use of insulin: Secondary | ICD-10-CM | POA: Diagnosis not present

## 2015-10-07 DIAGNOSIS — E1165 Type 2 diabetes mellitus with hyperglycemia: Secondary | ICD-10-CM

## 2015-10-07 MED ORDER — INSULIN LISPRO PROT & LISPRO (50-50 MIX) 100 UNIT/ML KWIKPEN: PEN_INJECTOR | SUBCUTANEOUS | Status: DC

## 2015-10-07 NOTE — Patient Instructions (Signed)
Check blood sugars on waking up 4-5 times a week Also check blood sugars about 2 hours after a meal and do this after different meals by rotation  Recommended blood sugar levels on waking up is 90-130 and about 2 hours after meal is 130-160  Please bring your blood sugar monitor to each visit, thank you  Insulin 10--10--15 before meals

## 2015-10-07 NOTE — Progress Notes (Signed)
Patient ID: [name], male   DOB: 08/10/1940, 75 y.o.   MRN: [id number]           Reason for Appointment: Follow-up for Type 2 Diabetes  Referring physician: Brigitte Pulse  History of Present Illness:          Date of diagnosis of type 2 diabetes mellitus: 1997       Background history:  He was initially treated with oral agents and also at some point was given Victoza. Most of his diabetes treatment has been with insulin which he has taken since at least 2005 Although he had improved control with Victoza he stopped this as he started having some low sugars Although he was tried on metformin in 2012 this had been subsequently stopped because of renal dysfunction He was initially treated with 70/30 insulin but subsequently was given Apidra and Lantus He was taking about 90 units of Lantus daily in split doses and 35-40 units Apidra previously with each meal He thinks that about a year ago one of his physicians  In 2015 this was changed to  Humalog 50/50  Recent history:  INSULIN regimen is described as: none  He has not been seen in follow-up for a year.  Current management, blood sugar patterns and problems identified:  He has not had any test strips for 2 or 3 months and does not know what his blood sugars.  Fasting glucose in the lab was 156  Also because of lack of insulin prescription and his insulin supply being outdated he stopped taking insulin on his own at least 2 months ago and did not notify his PCP or plan revisit here for follow-up as scheduled   he has not had any symptoms high sugar like excessive thirst or urination  He says he is losing weight because of significantly decreased appetite   His A1c has been over 10% since 06/2014, previously in 2015 was lower and now up to 9.2  Compliance with the medical regimen:  fair  Hypoglycemia:   none, he thinks his lowest blood sugar has been about 84  Glucose monitoring:  not done        Glucometer:  Bayer     Blood  Glucose readings none   Self-care: The diet that the patient has been following is: None .     Meal times: Breakfast:10 am Lunch:  not consistent  Dinner: 7 p.m.  Typical meal intake: Breakfast is cereal, occasionally having fish sandwich from [employer]               Dietician visit, most recent: 2013                Exercise:Gardening    Weight  history:He has lost about 12 pounds in the last year although had gained back some weight last month  Wt Readings from Last 3 Encounters:  10/07/15 198 lb (89.812 kg)  09/24/15 205 lb 0.4 oz (93 kg)  08/24/15 207 lb 12 oz (94.235 kg)    Glycemic control:   Lab Results  Component Value Date   HGBA1C 9.2* 10/04/2015   HGBA1C 8.9* 01/05/2012   HGBA1C 7.9* 08/25/2011   Lab Results  Component Value Date   LDLCALC 80 02/17/2015   CREATININE 1.67* 10/04/2015    Last Creatinine 2.9, Last microalbumin/creatinine ratio 112 in 3/16     Medication List       This list is accurate as of: 10/07/15  3:31 PM.  Always use your most  recent med list.               allopurinol 100 MG tablet  Commonly known as:  ZYLOPRIM  Take 100 mg by mouth 2 (two) times daily.     aspirin EC 81 MG tablet  Take 81 mg by mouth daily.     atorvastatin 80 MG tablet  Commonly known as:  LIPITOR  Take 1 tablet (80 mg total) by mouth daily at 6 PM.     carvedilol 12.5 MG tablet  Commonly known as:  COREG  Take 1 tablet (12.5 mg total) by mouth 2 (two) times daily with a meal.     clopidogrel 75 MG tablet  Commonly known as:  PLAVIX  Take 1 tablet (75 mg total) by mouth daily.     colchicine 0.6 MG tablet  Take 1 tablet (0.6 mg total) by mouth daily as needed (for gout flare).     eplerenone 25 MG tablet  Commonly known as:  INSPRA  Take 1 tablet (25 mg total) by mouth every evening.     furosemide 40 MG tablet  Commonly known as:  LASIX  Take 40 mg by mouth 2 (two) times daily.     Insulin Lispro Prot & Lispro (50-50) 100 UNIT/ML Kwikpen    Commonly known as:  HUMALOG MIX 50/50 KWIKPEN  10 units am and lunch and 15 at supper     Insulin Pen Needle 32G X 4 MM Misc  Commonly known as:  RELION PEN NEEDLES  Use to inject insulin 3 times per day.     isosorbide mononitrate 30 MG 24 hr tablet  Commonly known as:  IMDUR  Take 1 tablet (30 mg total) by mouth at bedtime.     lisinopril 5 MG tablet  Commonly known as:  PRINIVIL,ZESTRIL  Take 1 tablet (5 mg total) by mouth daily.     nitroGLYCERIN 0.4 MG SL tablet  Commonly known as:  NITROSTAT  Place 1 tablet (0.4 mg total) under the tongue every 5 (five) minutes x 3 doses as needed. For chest pain.     ONE TOUCH ULTRA TEST test strip  Generic drug:  glucose blood  Use to check blood sugar 3 times per day.     ONETOUCH DELICA LANCETS [id number] Misc     pantoprazole 40 MG tablet  Commonly known as:  PROTONIX  Take 1 tablet (40 mg total) by mouth daily.     potassium chloride SA 20 MEQ tablet  Commonly known as:  K-DUR,KLOR-CON  Take 1 tablet (20 mEq total) by mouth daily.     RANEXA 1000 MG SR tablet  Generic drug:  ranolazine  Take 500 mg by mouth 2 (two) times daily.     venlafaxine XR 150 MG 24 hr capsule  Commonly known as:  EFFEXOR-XR  Take 150 mg by mouth at bedtime.     zolpidem 12.5 MG CR tablet  Commonly known as:  AMBIEN CR  Take 12.5 mg by mouth at bedtime as needed. For sleep        Allergies:  Allergies  Allergen Reactions  . Codeine Nausea And Vomiting  . Latex Rash    Specifies adhesive     Past Medical History  Diagnosis Date  . Ischemic cardiomyopathy     s/p ICD Implantation by Dr Leonia Reeves  . Chronic systolic dysfunction of left ventricle     EF 30%  . Hyperlipemia   . DJD (degenerative joint disease)   . Gout   .  HTN (hypertension) 02/14/2011  . Depression   . Angina   . Myocardial infarction ([place]) 1997  . Cardiac defibrillator in situ   . DM (diabetes mellitus) ([place]) 02/14/2011  . CHF (congestive heart failure) ([place])     EF 30%  by cath 05/2011  . ICD (implantable cardiac defibrillator) in place   . GERD (gastroesophageal reflux disease)   . Heart attack ([place])   . Complication of anesthesia     ONCE WITH BACK SURGERY DIFFICULT TO WAKE UP  . CAD (coronary artery disease)     s/p CABG 1997, PCI (BMS) of SVG to RCA 3/09, redo CABG 02/2011 with SVG to PDA, SVG to Lcx, s/p cath 2.2013 with ocluded SVT to left circ and patent SVG to RCA.  Cath 07/2013 Recent cath showed ischemic cardiomyopathy with LVEF less than 20%, with progressive LV dysfunction related to bypass graft failure, occlusion of the saphenous vein grafts placed in 2012 to the right coronary and to the circum  . Paroxysmal atrial fibrillation (HCC) 10/22/14    single episode of AF x 3 hours 48 minutes recorded on ICD, chads2vasc score is at least 5     Past Surgical History  Procedure Laterality Date  . Cardiac defibrillator placement  08/2004    initial placement, upgraded to [place] ICD by Dr Lovena Le 08/24/11 (MDT)  . Knee arthrotomy  ~ 1978    RIGHT KNEE CARTILAGE REMOVED  . Coronary angioplasty with stent placement  06/2007    BMS to SVG to RCA  . Coronary angioplasty  02/2011  . Cataract extraction w/ intraocular lens  implant, bilateral  2012  . Cardiac defibrillator placement  2009  . Lumbar disc surgery  2003  . Back surgery  2003  . Coronary artery bypass graft  01/1996    CABG x 5 LIMA to LAD SVG to diag1,2,svg to om,svg to RCA  . Coronary artery bypass graft  03/06/2011    CABG X2; Procedure: REDO CORONARY ARTERY BYPASS GRAFTING (CABG);  Surgeon: Grace Isaac, MD;  Location: [place];  Service: Open Heart Surgery;  Laterality: N/A;  times two grafts using right saphenous vein harvested endoscopically.  . Cholecystectomy  01/05/2012    Procedure: LAPAROSCOPIC CHOLECYSTECTOMY WITH INTRAOPERATIVE CHOLANGIOGRAM;  Surgeon: Joyice Faster. Cornett, MD;  Location: [place];  Service: General;  Laterality: N/A;  laparoscopic cholecysectoym with intraoperative  cholangiogram  . Colonoscopy with propofol N/A 11/18/2013    Procedure: COLONOSCOPY WITH PROPOFOL;  Surgeon: Garlan Fair, MD;  Location: [facility];  Service: Endoscopy;  Laterality: N/A;  . Left heart catheterization with coronary angiogram N/A 06/06/2011    Procedure: LEFT HEART CATHETERIZATION WITH CORONARY ANGIOGRAM;  Surgeon: Sueanne Margarita, MD;  Location: [facility] CATH LAB;  Service: Cardiovascular;  Laterality: N/A;  . Venogram N/A 06/08/2011    Procedure: VENOGRAM;  Surgeon: Thompson Grayer, MD;  Location: [facility] CATH LAB;  Service: Cardiovascular;  Laterality: N/A;  . Bi-ventricular implantable cardioverter defibrillator upgrade N/A 08/24/2011    Procedure: BI-VENTRICULAR IMPLANTABLE CARDIOVERTER DEFIBRILLATOR UPGRADE;  Surgeon: Evans Lance, MD;  Location: [facility] CATH LAB;  Service: Cardiovascular;  Laterality: N/A;  . Left heart catheterization with coronary/graft angiogram N/A 08/08/2013    Procedure: LEFT HEART CATHETERIZATION WITH Beatrix Fetters;  Surgeon: Sinclair Grooms, MD;  Location: [facility] CATH LAB;  Service: Cardiovascular;  Laterality: N/A;  . Right heart catheterization N/A 02/17/2014    Procedure: RIGHT HEART CATH;  Surgeon: Larey Dresser, MD;  Location: [facility] CATH LAB;  Service: Cardiovascular;  Laterality: N/A;  . Cardiac catheterization  08/03/2015    Procedure: Left Heart Cath and Cors/Grafts Angiography;  Surgeon: Peter M Martinique, MD;  Location: [place] CV LAB;  Service: Cardiovascular;;  . Cardiac catheterization N/A 08/06/2015    Procedure: Coronary Stent Intervention w/Impella;  Surgeon: Jettie Booze, MD;  Location: [place] CV LAB;  Service: Cardiovascular;  Laterality: N/A;  . Cardiac catheterization  08/06/2015    Procedure: Left Heart Cath;  Surgeon: Jettie Booze, MD;  Location: [place] CV LAB;  Service: Cardiovascular;;  . Cardiac catheterization  08/06/2015    Procedure: Coronary Balloon Angioplasty;  Surgeon: Jettie Booze, MD;  Location: [place] CV LAB;  Service: Cardiovascular;;    Family History  Problem Relation Age of Onset  . Coronary artery disease    . Heart failure Mother   . Heart failure Father   . Heart attack Mother   . Heart attack Father   . Sudden Cardiac Death Brother     in his 54's  . Heart attack Brother   . Cancer Brother     Social History:  reports that he quit smoking about 46 years ago. His smoking use included Cigarettes. He has a 7.5 pack-year smoking history. He quit smokeless tobacco use about 44 years ago. He reports that he drinks about 0.6 oz of alcohol per week. He reports that he does not use illicit drugs.    Review of Systems    Lipid history: He is on Lipitor 80 mg Has history of CAD    Lab Results  Component Value Date   CHOL 147 02/17/2015   HDL 32* 02/17/2015   LDLCALC 80 02/17/2015   TRIG 176* 02/17/2015   CHOLHDL 4.6 02/17/2015              Physical Examination:  BP 81/56 mmHg  Pulse 97  Ht 5\' 10"  (1.778 m)  Wt 198 lb (89.812 kg)  BMI 28.41 kg/m2  SpO2 94%      ASSESSMENT:  Diabetes type 2, uncontrolled     He has had long-standing diabetes,  previously requiring insulin  He has been historically somewhat insulin resistant and has had difficulty getting consistent control  However now surprisingly he has not had a marked increase in blood sugars are A1c without taking insulin for reportedly 2-3 months He has not checked his blood sugars and not clear what his blood sugar patterns are Judging from his fasting reading of 156 he probably has mostly postprandial hyperglycemia since his A1c is 9.2  Although previously was recommended to use the U-500 insulin he may not need as much insulin now with his weight loss and decreased appetite Probably does need repeat diabetes education and meal planning instructions  Not a candidate for a GLP-1 drug because of his decreased appetite and anorexia and his renal dysfunction precludes other oral diabetic  drugs  PLAN:   For simplicity will start him back on the 50/50 insulin with the pen which is apparently covered by his insurance now  He does need to start checking his blood sugars at least 2 times a day  Will need to follow-up in 3 weeks for adjustment of his insulin  Discussed timing of insulin, blood sugar targets and need for regular monitoring  He will discuss his decreased appetite and weight loss with PCP  Patient Instructions  Check blood sugars on waking up 4-5 times a week Also check blood sugars about 2 hours  after a meal and do this after different meals by rotation  Recommended blood sugar levels on waking up is 90-130 and about 2 hours after meal is 130-160  Please bring your blood sugar monitor to each visit, thank you  Insulin 10--10--15 before meals    Counseling time on subjects discussed above is over 50% of today's 25 minute visit  Bayleigh Loflin 10/07/2015, 3:31 PM   Note: This office note was prepared with Dragon voice recognition system technology. Any transcriptional errors that result from this process are unintentional.

## 2015-10-08 ENCOUNTER — Encounter (HOSPITAL_COMMUNITY)
Admission: RE | Admit: 2015-10-08 | Discharge: 2015-10-08 | Disposition: A | Discharge status: Home / Self Care | Payer: Managed Care, Other (non HMO) | Source: Ambulatory Visit | Attending: Cardiology | Admitting: Cardiology

## 2015-10-08 DIAGNOSIS — Z955 Presence of coronary angioplasty implant and graft: Secondary | ICD-10-CM | POA: Diagnosis not present

## 2015-10-08 NOTE — Progress Notes (Signed)
Daily Session Note  Patient Details  Name: HUMZA TALLERICO MRN: [id number] Date of Birth: 21-Jun-1940 Referring Provider:        CARDIAC REHAB PHASE II EXERCISE from 09/24/2015 in [place]   Referring Provider  Dr. Aundra Dubin      Encounter Date: 10/08/2015  Check In:     Session Check In - 10/08/15 1100    Check-In   Location [facility]   Staff Present Diane Angelina Pih, MS, EP, [facility], Exercise Physiologist;Cieara Stierwalt Luther Parody, BS, EP, Exercise Physiologist   Supervising physician immediately available to respond to emergencies See telemetry face sheet for immediately available MD   Medication changes reported     No   Fall or balance concerns reported    No   Warm-up and Cool-down Performed as group-led instruction   Resistance Training Performed Yes   VAD Patient? No   VAD patient   Has back up controller? No   Pain Assessment   Currently in Pain? No/denies   Pain Score 0-No pain   Multiple Pain Sites No      Capillary Blood Glucose: No results found for this or any previous visit (from the past 24 hour(s)).   Goals Met:  Independence with exercise equipment Exercise tolerated well No report of cardiac concerns or symptoms Strength training completed today  Goals Unmet:  Not Applicable  Comments: Check out 1200   Dr. Kate Sable is Medical Director for [facility] and Pulmonary Rehab.

## 2015-10-11 ENCOUNTER — Encounter (HOSPITAL_COMMUNITY)
Admission: RE | Admit: 2015-10-11 | Discharge: 2015-10-11 | Disposition: A | Discharge status: Home / Self Care | Payer: Managed Care, Other (non HMO) | Source: Ambulatory Visit | Attending: Cardiology | Admitting: Cardiology

## 2015-10-11 DIAGNOSIS — Z955 Presence of coronary angioplasty implant and graft: Secondary | ICD-10-CM

## 2015-10-11 DIAGNOSIS — I214 Non-ST elevation (NSTEMI) myocardial infarction: Secondary | ICD-10-CM

## 2015-10-11 NOTE — Progress Notes (Signed)
Daily Session Note  Patient Details  Name: [name] MRN: [id number] Date of Birth: 07-28-1940 Referring Provider:        CARDIAC REHAB PHASE II EXERCISE from 09/24/2015 in [place]   Referring Provider  Dr. Aundra Dubin      Encounter Date: 10/11/2015  Check In:     Session Check In - 10/11/15 1100    Check-In   Location [facility]   Staff Present Diane Angelina Pih, MS, EP, [facility], Exercise Physiologist;[place] Strieter Luther Parody, BS, EP, Exercise Physiologist   Supervising physician immediately available to respond to emergencies See telemetry face sheet for immediately available MD   Medication changes reported     No   Fall or balance concerns reported    No   Warm-up and Cool-down Performed as group-led instruction   Resistance Training Performed Yes   VAD Patient? No   Pain Assessment   Currently in Pain? No/denies   Pain Score 0-No pain   Multiple Pain Sites No      Capillary Blood Glucose: No results found for this or any previous visit (from the past 24 hour(s)).   Goals Met:  Independence with exercise equipment Exercise tolerated well No report of cardiac concerns or symptoms Strength training completed today  Goals Unmet:  Not Applicable  Comments: Check out 1200   Dr. Kate Sable is Medical Director for [facility] and Pulmonary Rehab.

## 2015-10-11 NOTE — Progress Notes (Signed)
Cardiac Individual Treatment Plan  Patient Details  Name: [name] MRN: [id number] Date of Birth: Jul 07, 1940 Referring Provider:        CARDIAC REHAB PHASE II EXERCISE from 09/24/2015 in [place]   Referring Provider  Dr. Aundra Dubin      Initial Encounter Date:       CARDIAC REHAB PHASE II EXERCISE from 09/24/2015 in [place]   Date  09/24/15   Referring Provider  Dr. Aundra Dubin      Visit Diagnosis: No diagnosis found.  Patient's Home Medications on Admission:  Current outpatient prescriptions:  .  allopurinol (ZYLOPRIM) 100 MG tablet, Take 100 mg by mouth 2 (two) times daily. , Disp: , Rfl:  .  aspirin EC 81 MG tablet, Take 81 mg by mouth daily., Disp: , Rfl:  .  atorvastatin (LIPITOR) 80 MG tablet, Take 1 tablet (80 mg total) by mouth daily at 6 PM., Disp: 30 tablet, Rfl: 5 .  carvedilol (COREG) 12.5 MG tablet, Take 1 tablet (12.5 mg total) by mouth 2 (two) times daily with a meal., Disp: 30 tablet, Rfl: 5 .  clopidogrel (PLAVIX) 75 MG tablet, Take 1 tablet (75 mg total) by mouth daily., Disp: 90 tablet, Rfl: 3 .  colchicine 0.6 MG tablet, Take 1 tablet (0.6 mg total) by mouth daily as needed (for gout flare)., Disp: 30 tablet, Rfl: 6 .  eplerenone (INSPRA) 25 MG tablet, Take 1 tablet (25 mg total) by mouth every evening., Disp: 30 tablet, Rfl: 6 .  furosemide (LASIX) 40 MG tablet, Take 40 mg by mouth 2 (two) times daily., Disp: , Rfl:  .  Insulin Lispro Prot & Lispro (HUMALOG MIX 50/50 KWIKPEN) (50-50) 100 UNIT/ML Kwikpen, 10 units am and lunch and 15 at supper, Disp: 15 mL, Rfl: 0 .  Insulin Pen Needle (RELION PEN NEEDLES) 32G X 4 MM MISC, Use to inject insulin 3 times per day., Disp: 150 each, Rfl: 2 .  isosorbide mononitrate (IMDUR) 30 MG 24 hr tablet, Take 1 tablet (30 mg total) by mouth at bedtime., Disp: 30 tablet, Rfl: 5 .  lisinopril (PRINIVIL,ZESTRIL) 5 MG tablet, Take 1 tablet (5 mg total) by mouth daily., Disp: 30 tablet,  Rfl: 3 .  nitroGLYCERIN (NITROSTAT) 0.4 MG SL tablet, Place 1 tablet (0.4 mg total) under the tongue every 5 (five) minutes x 3 doses as needed. For chest pain., Disp: 25 tablet, Rfl: 3 .  ONE TOUCH ULTRA TEST test strip, Use to check blood sugar 3 times per day., Disp: 150 each, Rfl: 2 .  ONETOUCH DELICA LANCETS [id number] MISC, , Disp: , Rfl:  .  pantoprazole (PROTONIX) 40 MG tablet, Take 1 tablet (40 mg total) by mouth daily., Disp: 30 tablet, Rfl: 11 .  potassium chloride SA (K-DUR,KLOR-CON) 20 MEQ tablet, Take 1 tablet (20 mEq total) by mouth daily., Disp: 30 tablet, Rfl: 5 .  ranolazine (RANEXA) 1000 MG SR tablet, Take 500 mg by mouth 2 (two) times daily., Disp: , Rfl:  .  venlafaxine (EFFEXOR-XR) 150 MG 24 hr capsule, Take 150 mg by mouth at bedtime. , Disp: , Rfl:  .  zolpidem (AMBIEN CR) 12.5 MG CR tablet, Take 12.5 mg by mouth at bedtime as needed. For sleep, Disp: , Rfl:   Past Medical History: Past Medical History  Diagnosis Date  . Ischemic cardiomyopathy     s/p ICD Implantation by Dr Leonia Reeves  . Chronic systolic dysfunction of left ventricle     EF 30%  .  Hyperlipemia   . DJD (degenerative joint disease)   . Gout   . HTN (hypertension) 02/14/2011  . Depression   . Angina   . Myocardial infarction ([place]) 1997  . Cardiac defibrillator in situ   . DM (diabetes mellitus) ([place]) 02/14/2011  . CHF (congestive heart failure) ([place])     EF 30% by cath 05/2011  . ICD (implantable cardiac defibrillator) in place   . GERD (gastroesophageal reflux disease)   . Heart attack ([place])   . Complication of anesthesia     ONCE WITH BACK SURGERY DIFFICULT TO WAKE UP  . CAD (coronary artery disease)     s/p CABG 1997, PCI (BMS) of SVG to RCA 3/09, redo CABG 02/2011 with SVG to PDA, SVG to Lcx, s/p cath 2.2013 with ocluded SVT to left circ and patent SVG to RCA.  Cath 07/2013 Recent cath showed ischemic cardiomyopathy with LVEF less than 20%, with progressive LV dysfunction related to bypass graft  failure, occlusion of the saphenous vein grafts placed in 2012 to the right coronary and to the circum  . Paroxysmal atrial fibrillation (HCC) 10/22/14    single episode of AF x 3 hours 48 minutes recorded on ICD, chads2vasc score is at least 5     Tobacco Use: History  Smoking status  . Former Smoker -- 0.50 packs/day for 15 years  . Types: Cigarettes  . Quit date: 04/17/1969  Smokeless tobacco  . Former Systems developer  . Quit date: 05/05/1971    Labs:     Recent Review Flowsheet Data    Labs for ITP Cardiac and Pulmonary Rehab Latest Ref Rng 02/17/2014 02/17/2014 06/29/2014 02/17/2015 10/04/2015   Cholestrol 0 - 200 mg/dL - - 116 147 -   LDLCALC 0 - 99 mg/dL - - 46 80 -   HDL >40 mg/dL - - 35.50(L) 32(L) -   Trlycerides <150 mg/dL - - 174.0(H) 176(H) -   Hemoglobin A1c 4.6 - 6.5 % - - - - 9.2(H)   PHART 7.350 - 7.450 - 7.412 - - -   PCO2ART 35.0 - 45.0 mmHg - 40.2 - - -   HCO3 20.0 - 24.0 mEq/L 26.3(H) 25.6(H) - - -   TCO2 0 - 100 mmol/L 28 27 - - -   O2SAT - 71.0 98.0 - - -      Capillary Blood Glucose: Lab Results  Component Value Date   GLUCAP 242* 08/08/2015   GLUCAP 148* 08/08/2015   GLUCAP 241* 08/07/2015   GLUCAP 238* 08/07/2015   GLUCAP 205* 08/07/2015     Exercise Target Goals:    Exercise Program Goal: Individual exercise prescription set with THRR, safety & activity barriers. Participant demonstrates ability to understand and report RPE using BORG scale, to self-measure pulse accurately, and to acknowledge the importance of the exercise prescription.  Exercise Prescription Goal: Starting with aerobic activity 30 plus minutes a day, 3 days per week for initial exercise prescription. Provide home exercise prescription and guidelines that participant acknowledges understanding prior to discharge.  Activity Barriers & Risk Stratification:     Activity Barriers & Cardiac Risk Stratification - 09/24/15 1539    Activity Barriers & Cardiac Risk Stratification   Activity  Barriers None   Cardiac Risk Stratification High      6 Minute Walk:     6 Minute Walk      09/24/15 1449       6 Minute Walk   Phase Initial     Distance 1025 feet  Walk Time 6 minutes     # of Rest Breaks 0     MPH 1.94     METS 2.48     RPE 11     Perceived Dyspnea  11     VO2 Peak 8.19     Symptoms No     Resting HR 89 bpm     Resting BP 120/60 mmHg     Max Ex. HR 114 bpm     Max Ex. BP 156/66 mmHg     2 Minute Post BP 124/64 mmHg        Initial Exercise Prescription:     Initial Exercise Prescription - 09/24/15 1400    Date of Initial Exercise RX and Referring Provider   Date 09/24/15   Referring Provider Dr. Aundra Dubin   Treadmill   MPH 1.9   Grade 0   Minutes 15   METs 1.9   NuStep   Level 2   Watts 22   Minutes 15   METs 1.9   Arm Ergometer   Level 2   Watts 23   Minutes 15   METs 2   Prescription Details   Frequency (times per week) 3   Duration Progress to 30 minutes of continuous aerobic without signs/symptoms of physical distress   Intensity   THRR REST +  30   THRR 40-80% of Max Heartrate [phone]   Ratings of Perceived Exertion 11-13   Perceived Dyspnea 0-4   Progression   Progression Continue to progress workloads to maintain intensity without signs/symptoms of physical distress.   Resistance Training   Training Prescription Yes   Weight 1   Reps 10-12      Perform Capillary Blood Glucose checks as needed.  Exercise Prescription Changes:      Exercise Prescription Changes      10/11/15 1800           Response to Exercise   Blood Pressure (Admit) 120/62 mmHg       Blood Pressure (Exercise) 128/68 mmHg       Blood Pressure (Exit) 110/60 mmHg       Heart Rate (Admit) 90 bpm       Heart Rate (Exercise) 99 bpm       Heart Rate (Exit) 88 bpm       Rating of Perceived Exertion (Exercise) 11       Duration Progress to 30 minutes of continuous aerobic without signs/symptoms of physical distress       Intensity Rest +  30       Progression   Progression Continue to progress workloads to maintain intensity without signs/symptoms of physical distress.       Resistance Training   Training Prescription Yes       Weight 2       Reps 10-12       Treadmill   MPH 1.4       Grade 0       Minutes 15       METs 2       NuStep   Level 2       Watts 14       Minutes 20       METs 3.62       Home Exercise Plan   Plans to continue exercise at Home       Frequency Add 2 additional days to program exercise sessions.          Exercise Comments:  Discharge Exercise Prescription (Final Exercise Prescription Changes):     Exercise Prescription Changes - 10/11/15 1800    Response to Exercise   Blood Pressure (Admit) 120/62 mmHg   Blood Pressure (Exercise) 128/68 mmHg   Blood Pressure (Exit) 110/60 mmHg   Heart Rate (Admit) 90 bpm   Heart Rate (Exercise) 99 bpm   Heart Rate (Exit) 88 bpm   Rating of Perceived Exertion (Exercise) 11   Duration Progress to 30 minutes of continuous aerobic without signs/symptoms of physical distress   Intensity Rest + 30   Progression   Progression Continue to progress workloads to maintain intensity without signs/symptoms of physical distress.   Resistance Training   Training Prescription Yes   Weight 2   Reps 10-12   Treadmill   MPH 1.4   Grade 0   Minutes 15   METs 2   NuStep   Level 2   Watts 14   Minutes 20   METs 3.62   Home Exercise Plan   Plans to continue exercise at Home   Frequency Add 2 additional days to program exercise sessions.      Nutrition:  Target Goals: Understanding of nutrition guidelines, daily intake of sodium 1500mg , cholesterol 200mg , calories 30% from fat and 7% or less from saturated fats, daily to have 5 or more servings of fruits and vegetables.  Biometrics:     Pre Biometrics - 09/24/15 1452    Pre Biometrics   Height 5\' 7"  (1.702 m)   Weight 205 lb 0.4 oz (93 kg)   Waist Circumference 43 inches   Hip Circumference  43 inches   Waist to Hip Ratio 1 %   BMI (Calculated) 32.2   Triceps Skinfold 15 mm   % Body Fat 30.9 %   Grip Strength 70 kg   Flexibility 13.7 in   Single Leg Stand 3 seconds       Nutrition Therapy Plan and Nutrition Goals:   Nutrition Discharge: Rate Your Plate Scores:     Nutrition Assessments - 09/24/15 1546    MEDFICTS Scores   Pre Score 27      Nutrition Goals Re-Evaluation:   Psychosocial: Target Goals: Acknowledge presence or absence of depression, maximize coping skills, provide positive support system. Participant is able to verbalize types and ability to use techniques and skills needed for reducing stress and depression.  Initial Review & Psychosocial Screening:     Initial Psych Review & Screening - 09/24/15 1649    Initial Review   Current issues with --  No abuse or negelect issues.    Family Dynamics   Good Support System? Yes   Barriers   Psychosocial barriers to participate in program There are no identifiable barriers or psychosocial needs.   Screening Interventions   Interventions Encouraged to exercise      Quality of Life Scores:     Quality of Life - 09/24/15 1454    Quality of Life Scores   Health/Function Pre 24.17 %   Socioeconomic Pre 25.5 %   Psych/Spiritual Pre 24.14 %   Family Pre 27.75 %   GLOBAL Pre 24.86 %      PHQ-9:     Recent Review Flowsheet Data    Depression screen [facility] 2/9 09/24/2015   Decreased Interest 0   Down, Depressed, Hopeless 1   PHQ - 2 Score 1   Altered sleeping 2   Tired, decreased energy 2   Change in appetite 3   Feeling bad or failure  about yourself  1   Trouble concentrating 0   Moving slowly or fidgety/restless 1   Suicidal thoughts 0   PHQ-9 Score 10   Difficult doing work/chores Somewhat difficult      Psychosocial Evaluation and Intervention:     Psychosocial Evaluation - 09/24/15 1650    Psychosocial Evaluation & Interventions   Interventions Encouraged to exercise with the  program and follow exercise prescription;Stress management education;Relaxation education   Comments Patient is already seeing an counselor for his depression   Continued Psychosocial Services Needed Yes      Psychosocial Re-Evaluation:     Psychosocial Re-Evaluation      10/11/15 1850           Psychosocial Re-Evaluation   Interventions Encouraged to attend Cardiac Rehabilitation for the exercise       Continued Psychosocial Services Needed No          Vocational Rehabilitation: Provide vocational rehab assistance to qualifying candidates.   Vocational Rehab Evaluation & Intervention:     Vocational Rehab - 09/24/15 1541    Initial Vocational Rehab Evaluation & Intervention   Assessment shows need for Vocational Rehabilitation No      Education: Education Goals: Education classes will be provided on a weekly basis, covering required topics. Participant will state understanding/return demonstration of topics presented.  Learning Barriers/Preferences:     Learning Barriers/Preferences - 09/24/15 1539    Learning Barriers/Preferences   Learning Barriers None   Learning Preferences Skilled Demonstration      Education Topics: Hypertension, Hypertension Reduction -Define heart disease and high blood pressure. Discus how high blood pressure affects the body and ways to reduce high blood pressure.   Exercise and Your Heart -Discuss why it is important to exercise, the FITT principles of exercise, normal and abnormal responses to exercise, and how to exercise safely.   Angina -Discuss definition of angina, causes of angina, treatment of angina, and how to decrease risk of having angina.   Cardiac Medications -Review what the following cardiac medications are used for, how they affect the body, and side effects that may occur when taking the medications.  Medications include Aspirin, Beta blockers, calcium channel blockers, ACE Inhibitors, angiotensin receptor  blockers, diuretics, digoxin, and antihyperlipidemics.   Congestive Heart Failure -Discuss the definition of CHF, how to live with CHF, the signs and symptoms of CHF, and how keep track of weight and sodium intake.   Heart Disease and Intimacy -Discus the effect sexual activity has on the heart, how changes occur during intimacy as we age, and safety during sexual activity.   Smoking Cessation / COPD -Discuss different methods to quit smoking, the health benefits of quitting smoking, and the definition of COPD.      CARDIAC REHAB PHASE II EXERCISE from 10/06/2015 in [place]   Date  09/29/15   Educator  Russella Dar   Instruction Review Code  2- meets goals/outcomes      Nutrition I: Fats -Discuss the types of cholesterol, what cholesterol does to the heart, and how cholesterol levels can be controlled.          CARDIAC REHAB PHASE II EXERCISE from 10/06/2015 in [place]   Date  10/06/15   Educator  Russella Dar   Instruction Review Code  2- meets goals/outcomes      Nutrition II: Labels -Discuss the different components of food labels and how to read food label   Heart Parts and Heart Disease -Discuss the  anatomy of the heart, the pathway of blood circulation through the heart, and these are affected by heart disease.   Stress I: Signs and Symptoms -Discuss the causes of stress, how stress may lead to anxiety and depression, and ways to limit stress.   Stress II: Relaxation -Discuss different types of relaxation techniques to limit stress.   Warning Signs of Stroke / TIA -Discuss definition of a stroke, what the signs and symptoms are of a stroke, and how to identify when someone is having stroke.   Knowledge Questionnaire Score:     Knowledge Questionnaire Score - 09/24/15 1540    Knowledge Questionnaire Score   Pre Score 21/24      Core Components/Risk Factors/Patient Goals at Admission:     Personal Goals  and Risk Factors at Admission - 09/24/15 1546    Core Components/Risk Factors/Patient Goals on Admission    Weight Management Weight Maintenance   Increase Strength and Stamina Yes   Intervention Provide advice, education, support and counseling about physical activity/exercise needs.;Develop an individualized exercise prescription for aerobic and resistive training based on initial evaluation findings, risk stratification, comorbidities and participant's personal goals.   Diabetes Yes   Intervention Provide education about signs/symptoms and action to take for hypo/hyperglycemia.   Expected Outcomes Long Term: Attainment of HbA1C < 7%.;Short Term: Participant verbalizes understanding of the signs/symptoms and immediate care of hyper/hypoglycemia, proper foot care and importance of medication, aerobic/resistive exercise and nutrition plan for blood glucose control.   Personal Goal Other Yes   Personal Goal Get stronger, be around the people in the program.    Intervention Exercise 3 times week in program and supplement 2 more days at home.    Expected Outcomes Get stonger and gain new friends.       Core Components/Risk Factors/Patient Goals Review:      Goals and Risk Factor Review      10/11/15 1848           Core Components/Risk Factors/Patient Goals Review   Personal Goals Review Increase Strength and Stamina  Wants to be around people.       Review Patient says he is getting stronger, he aslo enjoys being around his classmates.        Expected Outcomes Get stronger, continue to enjoy the fellowship          Core Components/Risk Factors/Patient Goals at Discharge (Final Review):      Goals and Risk Factor Review - 10/11/15 1848    Core Components/Risk Factors/Patient Goals Review   Personal Goals Review Increase Strength and Stamina  Wants to be around people.   Review Patient says he is getting stronger, he aslo enjoys being around his classmates.    Expected Outcomes Get  stronger, continue to enjoy the fellowship      ITP Comments:   Comments: Patient is doing well with program. Will continue to monitor for progress.

## 2015-10-13 ENCOUNTER — Encounter (HOSPITAL_COMMUNITY)
Admission: RE | Admit: 2015-10-13 | Discharge: 2015-10-13 | Disposition: A | Discharge status: Home / Self Care | Payer: Managed Care, Other (non HMO) | Source: Ambulatory Visit | Attending: Cardiology | Admitting: Cardiology

## 2015-10-13 DIAGNOSIS — Z955 Presence of coronary angioplasty implant and graft: Secondary | ICD-10-CM | POA: Diagnosis not present

## 2015-10-13 NOTE — Progress Notes (Signed)
Daily Session Note  Patient Details  Name: MAXTYN NUZUM MRN: [id number] Date of Birth: 27-Jun-1940 Referring Provider:        CARDIAC REHAB PHASE II EXERCISE from 09/24/2015 in [place]   Referring Provider  Dr. Aundra Dubin      Encounter Date: 10/13/2015  Check In:     Session Check In - 10/13/15 1100    Check-In   Location [facility]   Staff Present Diane Angelina Pih, MS, EP, [facility], Exercise Physiologist;Wallie Lagrand Luther Parody, BS, EP, Exercise Physiologist   Supervising physician immediately available to respond to emergencies See telemetry face sheet for immediately available MD   Medication changes reported     No   Fall or balance concerns reported    No   Warm-up and Cool-down Performed as group-led instruction   Resistance Training Performed Yes   VAD Patient? No   Pain Assessment   Currently in Pain? No/denies   Pain Score 0-No pain   Multiple Pain Sites No      Capillary Blood Glucose: No results found for this or any previous visit (from the past 24 hour(s)).   Goals Met:  Independence with exercise equipment Exercise tolerated well No report of cardiac concerns or symptoms Strength training completed today  Goals Unmet:  Not Applicable  Comments: Check out 1200   Dr. Kate Sable is Medical Director for [place] and Pulmonary Rehab.

## 2015-10-15 ENCOUNTER — Encounter (HOSPITAL_COMMUNITY)
Admission: RE | Admit: 2015-10-15 | Discharge: 2015-10-15 | Disposition: A | Discharge status: Home / Self Care | Payer: Managed Care, Other (non HMO) | Source: Ambulatory Visit | Attending: Cardiology | Admitting: Cardiology

## 2015-10-15 DIAGNOSIS — Z955 Presence of coronary angioplasty implant and graft: Secondary | ICD-10-CM | POA: Diagnosis not present

## 2015-10-15 NOTE — Progress Notes (Signed)
Daily Session Note  Patient Details  Name: MIKAEEL PETROW MRN: [id number] Date of Birth: 1940/10/13 Referring Provider:        CARDIAC REHAB PHASE II EXERCISE from 09/24/2015 in [place]   Referring Provider  Dr. Aundra Dubin      Encounter Date: 10/15/2015  Check In:     Session Check In - 10/15/15 1100    Check-In   Location [facility]   Staff Present Diane Angelina Pih, MS, EP, [facility], Exercise Physiologist;Hubbard Seldon Luther Parody, BS, EP, Exercise Physiologist;Debra Wynetta Emery, RN, BSN   Supervising physician immediately available to respond to emergencies See telemetry face sheet for immediately available MD   Medication changes reported     No   Fall or balance concerns reported    No   Warm-up and Cool-down Performed as group-led instruction   Resistance Training Performed Yes   VAD Patient? No   VAD patient   Has back up controller? No   Pain Assessment   Currently in Pain? No/denies   Pain Score 0-No pain   Multiple Pain Sites No      Capillary Blood Glucose: No results found for this or any previous visit (from the past 24 hour(s)).   Goals Met:  Independence with exercise equipment Exercise tolerated well No report of cardiac concerns or symptoms Strength training completed today  Goals Unmet:  Not Applicable  Comments: Check out 1200   Dr. Kate Sable is Medical Director for [place] and Pulmonary Rehab.

## 2015-10-18 ENCOUNTER — Encounter (HOSPITAL_COMMUNITY): Payer: Managed Care, Other (non HMO)

## 2015-10-20 ENCOUNTER — Encounter (HOSPITAL_COMMUNITY): Payer: Managed Care, Other (non HMO)

## 2015-10-21 ENCOUNTER — Encounter: Payer: Managed Care, Other (non HMO) | Admitting: *Deleted

## 2015-10-21 ENCOUNTER — Telehealth: Payer: Self-pay | Admitting: Cardiology

## 2015-10-21 NOTE — Telephone Encounter (Signed)
LMOVM reminding pt to send remote transmission.   

## 2015-10-22 ENCOUNTER — Encounter (HOSPITAL_COMMUNITY)
Admission: RE | Admit: 2015-10-22 | Discharge: 2015-10-22 | Disposition: A | Discharge status: Home / Self Care | Payer: Managed Care, Other (non HMO) | Source: Ambulatory Visit | Attending: Cardiology | Admitting: Cardiology

## 2015-10-22 ENCOUNTER — Encounter: Payer: Self-pay | Admitting: Cardiology

## 2015-10-22 DIAGNOSIS — Z95810 Presence of automatic (implantable) cardiac defibrillator: Secondary | ICD-10-CM | POA: Diagnosis not present

## 2015-10-22 DIAGNOSIS — E785 Hyperlipidemia, unspecified: Secondary | ICD-10-CM | POA: Insufficient documentation

## 2015-10-22 DIAGNOSIS — I214 Non-ST elevation (NSTEMI) myocardial infarction: Secondary | ICD-10-CM | POA: Insufficient documentation

## 2015-10-22 DIAGNOSIS — I255 Ischemic cardiomyopathy: Secondary | ICD-10-CM | POA: Insufficient documentation

## 2015-10-22 DIAGNOSIS — Z79899 Other long term (current) drug therapy: Secondary | ICD-10-CM | POA: Insufficient documentation

## 2015-10-22 DIAGNOSIS — I509 Heart failure, unspecified: Secondary | ICD-10-CM | POA: Insufficient documentation

## 2015-10-22 DIAGNOSIS — I209 Angina pectoris, unspecified: Secondary | ICD-10-CM | POA: Insufficient documentation

## 2015-10-22 DIAGNOSIS — Z794 Long term (current) use of insulin: Secondary | ICD-10-CM | POA: Insufficient documentation

## 2015-10-22 DIAGNOSIS — Z7982 Long term (current) use of aspirin: Secondary | ICD-10-CM | POA: Insufficient documentation

## 2015-10-22 DIAGNOSIS — Z955 Presence of coronary angioplasty implant and graft: Secondary | ICD-10-CM | POA: Diagnosis present

## 2015-10-22 DIAGNOSIS — M109 Gout, unspecified: Secondary | ICD-10-CM | POA: Insufficient documentation

## 2015-10-22 DIAGNOSIS — K219 Gastro-esophageal reflux disease without esophagitis: Secondary | ICD-10-CM | POA: Insufficient documentation

## 2015-10-22 DIAGNOSIS — I110 Hypertensive heart disease with heart failure: Secondary | ICD-10-CM | POA: Insufficient documentation

## 2015-10-22 DIAGNOSIS — F329 Major depressive disorder, single episode, unspecified: Secondary | ICD-10-CM | POA: Insufficient documentation

## 2015-10-22 DIAGNOSIS — Z87891 Personal history of nicotine dependence: Secondary | ICD-10-CM | POA: Insufficient documentation

## 2015-10-22 DIAGNOSIS — I2510 Atherosclerotic heart disease of native coronary artery without angina pectoris: Secondary | ICD-10-CM | POA: Insufficient documentation

## 2015-10-22 DIAGNOSIS — M1990 Unspecified osteoarthritis, unspecified site: Secondary | ICD-10-CM | POA: Diagnosis not present

## 2015-10-22 DIAGNOSIS — I480 Paroxysmal atrial fibrillation: Secondary | ICD-10-CM | POA: Diagnosis not present

## 2015-10-22 DIAGNOSIS — E119 Type 2 diabetes mellitus without complications: Secondary | ICD-10-CM | POA: Diagnosis not present

## 2015-10-22 NOTE — Progress Notes (Signed)
Daily Session Note  Patient Details  Name: CHIBUEZE BEASLEY MRN: [id number] Date of Birth: [birth year] Referring Provider:        CARDIAC REHAB PHASE II EXERCISE from 09/24/2015 in [place]   Referring Provider  Dr. Aundra Dubin      Encounter Date: 10/22/2015  Check In:     Session Check In - 10/22/15 1100    Check-In   Location [facility]   Staff Present Diane Angelina Pih, MS, EP, [facility], Exercise Physiologist;Corry Storie Luther Parody, BS, EP, Exercise Physiologist;Debra Wynetta Emery, RN, BSN   Supervising physician immediately available to respond to emergencies See telemetry face sheet for immediately available MD   Medication changes reported     No   Fall or balance concerns reported    No   Warm-up and Cool-down Performed as group-led instruction   Resistance Training Performed Yes   VAD Patient? No   Pain Assessment   Currently in Pain? No/denies   Pain Score 0-No pain   Multiple Pain Sites No      Capillary Blood Glucose: No results found for this or any previous visit (from the past 24 hour(s)).   Goals Met:  Independence with exercise equipment Exercise tolerated well No report of cardiac concerns or symptoms Strength training completed today  Goals Unmet:  Not Applicable  Comments: Check out 1200   Dr. Kate Sable is Medical Director for [place] and Pulmonary Rehab.

## 2015-10-25 ENCOUNTER — Encounter (HOSPITAL_COMMUNITY)
Admission: RE | Admit: 2015-10-25 | Discharge: 2015-10-25 | Disposition: A | Discharge status: Home / Self Care | Payer: Managed Care, Other (non HMO) | Source: Ambulatory Visit | Attending: Cardiology | Admitting: Cardiology

## 2015-10-25 DIAGNOSIS — Z955 Presence of coronary angioplasty implant and graft: Secondary | ICD-10-CM | POA: Diagnosis not present

## 2015-10-25 DIAGNOSIS — I214 Non-ST elevation (NSTEMI) myocardial infarction: Secondary | ICD-10-CM

## 2015-10-25 NOTE — Progress Notes (Signed)
Daily Session Note  Patient Details  Name: [name] MRN: [id number] Date of Birth: 01-Sep-1940 Referring Provider:        CARDIAC REHAB PHASE II EXERCISE from 09/24/2015 in [place]   Referring Provider  Dr. Aundra Dubin      Encounter Date: 10/25/2015  Check In:     Session Check In - 10/25/15 1100    Check-In   Location [facility]   Staff Present Diane Angelina Pih, MS, EP, [facility], Exercise Physiologist;Mearl Olver Luther Parody, BS, EP, Exercise Physiologist;Debra Wynetta Emery, RN, BSN   Supervising physician immediately available to respond to emergencies See telemetry face sheet for immediately available MD   Medication changes reported     No   Fall or balance concerns reported    No   Warm-up and Cool-down Performed as group-led instruction   Resistance Training Performed Yes   VAD Patient? No   VAD patient   Has back up controller? No   Pain Assessment   Currently in Pain? No/denies   Pain Score 0-No pain   Multiple Pain Sites No      Capillary Blood Glucose: No results found for this or any previous visit (from the past 24 hour(s)).   Goals Met:  Independence with exercise equipment Exercise tolerated well No report of cardiac concerns or symptoms Strength training completed today  Goals Unmet:  Not Applicable  Comments: Check out 1200   Dr. Kate Sable is Medical Director for [facility] and Pulmonary Rehab.

## 2015-10-26 ENCOUNTER — Encounter (HOSPITAL_COMMUNITY): Payer: Self-pay

## 2015-10-26 ENCOUNTER — Ambulatory Visit (HOSPITAL_COMMUNITY)
Admission: RE | Admit: 2015-10-26 | Discharge: 2015-10-26 | Disposition: A | Discharge status: Home / Self Care | Payer: Managed Care, Other (non HMO) | Source: Ambulatory Visit | Attending: Cardiology | Admitting: Cardiology

## 2015-10-26 VITALS — BP 110/62 | HR 84 | Wt 202.2 lb

## 2015-10-26 DIAGNOSIS — Z955 Presence of coronary angioplasty implant and graft: Secondary | ICD-10-CM | POA: Insufficient documentation

## 2015-10-26 DIAGNOSIS — E1122 Type 2 diabetes mellitus with diabetic chronic kidney disease: Secondary | ICD-10-CM | POA: Insufficient documentation

## 2015-10-26 DIAGNOSIS — Z7902 Long term (current) use of antithrombotics/antiplatelets: Secondary | ICD-10-CM | POA: Diagnosis not present

## 2015-10-26 DIAGNOSIS — I2510 Atherosclerotic heart disease of native coronary artery without angina pectoris: Secondary | ICD-10-CM | POA: Diagnosis not present

## 2015-10-26 DIAGNOSIS — Z7982 Long term (current) use of aspirin: Secondary | ICD-10-CM | POA: Insufficient documentation

## 2015-10-26 DIAGNOSIS — I959 Hypotension, unspecified: Secondary | ICD-10-CM | POA: Insufficient documentation

## 2015-10-26 DIAGNOSIS — M109 Gout, unspecified: Secondary | ICD-10-CM | POA: Diagnosis not present

## 2015-10-26 DIAGNOSIS — Z951 Presence of aortocoronary bypass graft: Secondary | ICD-10-CM | POA: Insufficient documentation

## 2015-10-26 DIAGNOSIS — E785 Hyperlipidemia, unspecified: Secondary | ICD-10-CM | POA: Diagnosis not present

## 2015-10-26 DIAGNOSIS — I5022 Chronic systolic (congestive) heart failure: Secondary | ICD-10-CM

## 2015-10-26 DIAGNOSIS — Z87891 Personal history of nicotine dependence: Secondary | ICD-10-CM | POA: Insufficient documentation

## 2015-10-26 DIAGNOSIS — K219 Gastro-esophageal reflux disease without esophagitis: Secondary | ICD-10-CM | POA: Diagnosis not present

## 2015-10-26 DIAGNOSIS — N183 Chronic kidney disease, stage 3 unspecified: Secondary | ICD-10-CM

## 2015-10-26 DIAGNOSIS — F329 Major depressive disorder, single episode, unspecified: Secondary | ICD-10-CM | POA: Insufficient documentation

## 2015-10-26 DIAGNOSIS — I252 Old myocardial infarction: Secondary | ICD-10-CM | POA: Diagnosis not present

## 2015-10-26 DIAGNOSIS — I2582 Chronic total occlusion of coronary artery: Secondary | ICD-10-CM | POA: Diagnosis not present

## 2015-10-26 DIAGNOSIS — Z794 Long term (current) use of insulin: Secondary | ICD-10-CM | POA: Diagnosis not present

## 2015-10-26 LAB — BASIC METABOLIC PANEL
Anion gap: 10 (ref 5–15)
BUN: 22 mg/dL — AB (ref 6–20)
CALCIUM: 9.1 mg/dL (ref 8.9–10.3)
CO2: 26 mmol/L (ref 22–32)
CREATININE: 1.35 mg/dL — AB (ref 0.61–1.24)
Chloride: 100 mmol/L — ABNORMAL LOW (ref 101–111)
GFR calc Af Amer: 59 mL/min — ABNORMAL LOW (ref >60)
GFR, EST NON AFRICAN AMERICAN: 50 mL/min — AB (ref >60)
GLUCOSE: 135 mg/dL — AB (ref 65–99)
Potassium: 4 mmol/L (ref 3.5–5.1)
Sodium: 136 mmol/L (ref 135–145)

## 2015-10-26 MED ORDER — CARVEDILOL 12.5 MG PO TABS: 18.7500 mg | ORAL_TABLET | Freq: Two times a day (BID) | ORAL | Status: DC

## 2015-10-26 NOTE — Patient Instructions (Signed)
Routine lab work today. Will notify you of abnormal results  Increase Coreg 18.75mg  twice daily.  Follow up in 3 months with Dr.McLean

## 2015-10-26 NOTE — Progress Notes (Signed)
Advanced Heart Failure Clinic Note   Patient ID: [name], male   DOB: February 18, 1941, 75 y.o.   MRN: [id number] PCP: Dr. Lynnda Child Primary HF: Dr Aundra Dubin  75 yo with history of CAD s/p CABG and redo CABG as well as ischemic cardiomyopathy with CRT-D device presents for CHF clinic evaluation.  He had a Cardiolite in the 4/15 showing lateral wall ischemia.  LHC at that time showed all his vein grafts from both CABG surgeries occluded.  The LIMA-LAD was patent and there was a 90% distal LM stenosis.  The lateral ischemia likely corresponded to LCx territory downstream from the LM stenosis.  PCI of the distal LM was thought to be high risk and characteristics of the lesion were not favorable for PCI.  Last echo showed EF 25-30% with moderate RV systolic dysfunction.  He had RHC in 11/15 with normal filling pressures and relatively preserved cardiac index (2.55).    In 4/17, he was admitted with chest pain concerning for unstable angina.  Angiography showed 85% distal LM with 90% ostial LCx stenosis.  LIMA was patent but proximal LAD, proximal RCA, and all SVGs were totally occluded. High had successful DES to LM and PTCA to ostial LCx with Impella support.  [name] was stopped and he was put back on low dose lisinopril due to symptomatic hypotension.   He presents today for regular follow up. At last visit increased lisinopril.  Down 5 lbs since last visit. Has been feeling OK overall. Denies SOB. Does get occasionally lightheaded.  Has rolled out of bed 2 nights the past week.  Has done this before, but usually once and then several months prior to it happening again. Has occasional indigestion. No overt chest pain.  No DOE on flat ground. Avoids going outside in this heat. Doing cardiac rehab at [facility] and doing well. No orthopnea/PND.  No BRBPR/melena.  Has not needed any extra lasix. Weight at home 197-200.  Optivol checked today: No recent threshold crossings. Thoracic impedence stable. No AT/AF  alerts.   Labs (9/15): LDL particle number 816, LDL 57, LFTs normal Labs (10/15): K 4.4, creatinine 1.4 Labs (11/15): K 4.3, creatinine 1.36 Labs (12/15): K 4.8, creatinine 1.34 Labs (3/16): K 4, creatinine 1.38, LDL 46, HDl 35 Labs (7/16): K 4, creatinine 1.39, BNP 211 Labs (03/03/15): K 4.1, creatinine 1.47, BNP 227, LDL 80, HDL 32 Labs (12/16): K 4.6, creatinine 1.12, LDL 68, HDL 34 Labs (4/17): K 3.4, creatinine 1.15 Labs (6/17): K 3.8, creatinine 1.67  PMH: 1. Gout 2. Hyperlipidemia 3. CAD: CABG 1997 and redo 11/12.   - LHC (4/15) with totally occluded LAD, totally occluded RCA, 80% distal LM, 50% mLCx, 2 SVG-RCA grafts totally occluded, 2 SVG-OM grafts totally occluded, patent LIMA-LAD.  Cardiolite prior to 4/15 cath showed lateral wall ischemia (LCx territory).  PCI to distal LM would be a high risk procedure.   - Cardiolite (8/16) with EF 20%, severe scar in RCA and probably LCx territory, minimal peri-infarct ischemia.   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  - Unstable angina 4/17: 85% distal LM with 90% ostial LCx stenosis.  LIMA was patent but proximal LAD, proximal RCA, and all SVGs were totally occluded. High had successful DES to LM and PTCA to ostial LCx with Impella support 4. Ischemic Cardiomyopathy: Medtronic CRT-D device.   - Echo (6/15) with EF 25-30%, moderate LV dilation, inferior and inferolateral akinesis, moderately decreased RV systolic function, mild MR.   -  RHC (11/15) with mean RA 5, PA 23/6, mean PCWP 9, CI 2.55.  - Echo (4/17): EF 25-30% with mild MR.  5. H/o cholecystectomy 6. OA 7. Depression 8. Type II diabetes 9. GERD 10. CKD  SH: Married, prior smoker (many years ago), lives in [place].    FH: CAD  ROS: All systems reviewed and negative except as per HPI.   Current Outpatient Prescriptions  Medication Sig Dispense Refill  . allopurinol (ZYLOPRIM) 100 MG tablet Take 100 mg by mouth 2 (two) times daily.      Marland Kitchen aspirin EC 81 MG tablet Take 81 mg by mouth daily.    Marland Kitchen atorvastatin (LIPITOR) 80 MG tablet Take 1 tablet (80 mg total) by mouth daily at 6 PM. 30 tablet 5  . carvedilol (COREG) 12.5 MG tablet Take 1 tablet (12.5 mg total) by mouth 2 (two) times daily with a meal. 30 tablet 5  . clopidogrel (PLAVIX) 75 MG tablet Take 1 tablet (75 mg total) by mouth daily. 90 tablet 3  . colchicine 0.6 MG tablet Take 1 tablet (0.6 mg total) by mouth daily as needed (for gout flare). 30 tablet 6  . eplerenone (INSPRA) 25 MG tablet Take 1 tablet (25 mg total) by mouth every evening. 30 tablet 6  . furosemide (LASIX) 40 MG tablet Take 40 mg by mouth 2 (two) times daily.    . Insulin Lispro Prot & Lispro (HUMALOG MIX 50/50 KWIKPEN) (50-50) 100 UNIT/ML Kwikpen 10 units am and lunch and 15 at supper 15 mL 0  . Insulin Pen Needle (RELION PEN NEEDLES) 32G X 4 MM MISC Use to inject insulin 3 times per day. 150 each 2  . isosorbide mononitrate (IMDUR) 30 MG 24 hr tablet Take 1 tablet (30 mg total) by mouth at bedtime. 30 tablet 5  . lisinopril (PRINIVIL,ZESTRIL) 5 MG tablet Take 1 tablet (5 mg total) by mouth daily. 30 tablet 3  . ONE TOUCH ULTRA TEST test strip Use to check blood sugar 3 times per day. 150 each 2  . ONETOUCH DELICA LANCETS 99991111 MISC     . pantoprazole (PROTONIX) 40 MG tablet Take 1 tablet (40 mg total) by mouth daily. 30 tablet 11  . potassium chloride SA (K-DUR,KLOR-CON) 20 MEQ tablet Take 1 tablet (20 mEq total) by mouth daily. 30 tablet 5  . ranolazine (RANEXA) 1000 MG SR tablet Take 500 mg by mouth 2 (two) times daily.    Marland Kitchen venlafaxine (EFFEXOR-XR) 150 MG 24 hr capsule Take 150 mg by mouth at bedtime.     Marland Kitchen zolpidem (AMBIEN CR) 12.5 MG CR tablet Take 12.5 mg by mouth at bedtime as needed. For sleep    . nitroGLYCERIN (NITROSTAT) 0.4 MG SL tablet Place 1 tablet (0.4 mg total) under the tongue every 5 (five) minutes x 3 doses as needed. For chest pain. (Patient not taking: Reported on 10/26/2015) 25  tablet 3   No current facility-administered medications for this encounter.   BP 110/62 mmHg  Pulse 84  Wt 202 lb 4 oz (91.74 kg)  SpO2 97%   Wt Readings from Last 3 Encounters:  10/26/15 202 lb 4 oz (91.74 kg)  10/07/15 198 lb (89.812 kg)  09/24/15 205 lb 0.4 oz (93 kg)     General: NAD Neck: JVP flat, no thyromegaly or nodule noted Lungs: CTAB, normal effort CV: Nondisplaced PMI.  Heart regular S1/S2, no S3/S4, no murmur.  No carotid bruit.  Normal pedal pulses.  Abdomen: Soft, NT, ND, no  HSM.  Skin: Intact without lesions or rashes.  Neurologic: Alert and oriented x 3.  Psych: Normal affect. Extremities: No clubbing or cyanosis. No edema.   Assessment/Plan: 1. Chronic systolic CHF: NYHA class II symptoms, stable.  EF 25-30% in 4/17 (stable). He has a Medtronic CRT-D device.  Volume status stable - Continue eplerenone 25 mg daily - Off Entresto with low BP.   - Continue lisinopril 5 mg daily. Will not increase with recent creatinine bump. - Continue Imdur 30 mg daily.  - Continue Lasix 40 mg bid.  BMET today.  - Increase Coreg 18.75 mg bid. 2. CAD: s/p CABG and redo. Had DES to distal LM, angioplasty to ostial LCx in 4/17.  No chest pain since that time.  - Continue ASA 81, Plavix, statin.  - Continue Imdur, Coreg, and ranolazine 500 mg bid.  3. CKD stage II-III: BMET today. 4. Hyperlipidemia:  - Recent lipids stable.   5. Gout:  - No gout pain.   Med changes and labs as above. Follow up 3 months.     Shirley Friar, PA-C 10/26/2015   Patient seen with PA, agree with the above note.  He is stable currently.  No chest pain, no significant exertional dyspnea.  Volume looks ok.  I will increase his Coreg to 18.75 mg bid today.  Check BMET.  Followup in 3 months.   [name] 10/26/2015

## 2015-10-27 ENCOUNTER — Ambulatory Visit (INDEPENDENT_AMBULATORY_CARE_PROVIDER_SITE_OTHER): Payer: Managed Care, Other (non HMO) | Admitting: *Deleted

## 2015-10-27 ENCOUNTER — Encounter (HOSPITAL_COMMUNITY): Payer: Managed Care, Other (non HMO)

## 2015-10-27 DIAGNOSIS — I5022 Chronic systolic (congestive) heart failure: Secondary | ICD-10-CM

## 2015-10-27 DIAGNOSIS — Z95810 Presence of automatic (implantable) cardiac defibrillator: Secondary | ICD-10-CM

## 2015-10-27 DIAGNOSIS — I255 Ischemic cardiomyopathy: Secondary | ICD-10-CM

## 2015-10-28 NOTE — Progress Notes (Signed)
Remote ICD transmission.   

## 2015-10-29 ENCOUNTER — Encounter: Payer: Self-pay | Admitting: Cardiology

## 2015-10-29 ENCOUNTER — Encounter (HOSPITAL_COMMUNITY)
Admission: RE | Admit: 2015-10-29 | Discharge: 2015-10-29 | Disposition: A | Discharge status: Home / Self Care | Payer: Managed Care, Other (non HMO) | Source: Ambulatory Visit | Attending: Cardiology | Admitting: Cardiology

## 2015-10-29 DIAGNOSIS — Z955 Presence of coronary angioplasty implant and graft: Secondary | ICD-10-CM | POA: Diagnosis not present

## 2015-10-29 LAB — CUP PACEART REMOTE DEVICE CHECK
Brady Statistic AP VP Percent: 0.17 %
Brady Statistic AS VP Percent: 99.15 %
Brady Statistic AS VS Percent: 0.68 %
HIGH POWER IMPEDANCE MEASURED VALUE: 45 Ohm
HIGH POWER IMPEDANCE MEASURED VALUE: 475 Ohm
HighPow Impedance: 57 Ohm
Implantable Lead Implant Date: 20111003
Implantable Lead Location: 753858
Implantable Lead Location: 753859
Implantable Lead Location: 753860
Implantable Lead Model: 6947
Lead Channel Impedance Value: 399 Ohm
Lead Channel Impedance Value: 418 Ohm
Lead Channel Impedance Value: 608 Ohm
Lead Channel Impedance Value: 665 Ohm
Lead Channel Pacing Threshold Amplitude: 1 V
Lead Channel Pacing Threshold Amplitude: 2.25 V
Lead Channel Sensing Intrinsic Amplitude: 1.625 mV
Lead Channel Sensing Intrinsic Amplitude: 31.625 mV
Lead Channel Setting Pacing Amplitude: 2.5 V
Lead Channel Setting Pacing Pulse Width: 0.4 ms
Lead Channel Setting Sensing Sensitivity: 0.3 mV
MDC IDC LEAD IMPLANT DT: 20111003
MDC IDC LEAD IMPLANT DT: 20130509
MDC IDC LEAD MODEL: 4196
MDC IDC MSMT BATTERY VOLTAGE: 2.97 V
MDC IDC MSMT LEADCHNL LV PACING THRESHOLD PULSEWIDTH: 0.4 ms
MDC IDC MSMT LEADCHNL RA IMPEDANCE VALUE: 418 Ohm
MDC IDC MSMT LEADCHNL RA PACING THRESHOLD PULSEWIDTH: 0.4 ms
MDC IDC MSMT LEADCHNL RA SENSING INTR AMPL: 1.625 mV
MDC IDC MSMT LEADCHNL RV PACING THRESHOLD AMPLITUDE: 0.625 V
MDC IDC MSMT LEADCHNL RV PACING THRESHOLD PULSEWIDTH: 0.4 ms
MDC IDC MSMT LEADCHNL RV SENSING INTR AMPL: 31.625 mV
MDC IDC SESS DTM: 20170712193020
MDC IDC SET LEADCHNL LV PACING AMPLITUDE: 2 V
MDC IDC SET LEADCHNL LV PACING PULSEWIDTH: 0.4 ms
MDC IDC SET LEADCHNL RA PACING AMPLITUDE: 3.5 V
MDC IDC STAT BRADY AP VS PERCENT: 0.01 %
MDC IDC STAT BRADY RA PERCENT PACED: 0.18 %
MDC IDC STAT BRADY RV PERCENT PACED: 99.31 %

## 2015-10-29 NOTE — Progress Notes (Signed)
Daily Session Note  Patient Details  Name: [name] MRN: [id number] Date of Birth: 29-Nov-1940 Referring Provider:        CARDIAC REHAB PHASE II EXERCISE from 09/24/2015 in [place]   Referring Provider  Dr. Aundra Dubin      Encounter Date: 10/29/2015  Check In:     Session Check In - 10/29/15 1100    Check-In   Location [facility]   Staff Present Cason Dabney Angelina Pih, MS, EP, CHC, Exercise Physiologist;Gregory Luther Parody, BS, EP, Exercise Physiologist;Debra Wynetta Emery, RN, BSN   Supervising physician immediately available to respond to emergencies See telemetry face sheet for immediately available MD   Medication changes reported     No   Fall or balance concerns reported    No   Comments Patient has fallen 3 times in a year   Warm-up and Cool-down Performed as group-led instruction   Resistance Training Performed Yes   VAD Patient? No   VAD patient   Has back up controller? No   Pain Assessment   Currently in Pain? No/denies   Pain Score 0-No pain   Multiple Pain Sites No      Capillary Blood Glucose: No results found for this or any previous visit (from the past 24 hour(s)).   Goals Met:  Independence with exercise equipment Exercise tolerated well No report of cardiac concerns or symptoms Strength training completed today  Goals Unmet:  Not Applicable  Comments: Check out: 1200   Dr. Kate Sable is Medical Director for [facility] [place] [facility] and Pulmonary Rehab.

## 2015-11-01 ENCOUNTER — Encounter (HOSPITAL_COMMUNITY)
Admission: RE | Admit: 2015-11-01 | Discharge: 2015-11-01 | Disposition: A | Discharge status: Home / Self Care | Payer: Managed Care, Other (non HMO) | Source: Ambulatory Visit | Attending: Cardiology | Admitting: Cardiology

## 2015-11-01 DIAGNOSIS — Z955 Presence of coronary angioplasty implant and graft: Secondary | ICD-10-CM | POA: Diagnosis not present

## 2015-11-01 DIAGNOSIS — I214 Non-ST elevation (NSTEMI) myocardial infarction: Secondary | ICD-10-CM

## 2015-11-01 NOTE — Progress Notes (Signed)
Daily Session Note  Patient Details  Name: [name] MRN: [id number] Date of Birth: 1941-03-10 Referring Provider:        CARDIAC REHAB PHASE II EXERCISE from 09/24/2015 in [place]   Referring Provider  Dr. Aundra Dubin      Encounter Date: 11/01/2015  Check In:     Session Check In - 11/01/15 1100    Check-In   Location [facility]   Staff Present Diane Angelina Pih, MS, EP, CHC, Exercise Physiologist;Machell Wirthlin Luther Parody, BS, EP, Exercise Physiologist;Debra Wynetta Emery, RN, BSN   Supervising physician immediately available to respond to emergencies See telemetry face sheet for immediately available MD   Medication changes reported     No   Fall or balance concerns reported    No   Warm-up and Cool-down Performed as group-led instruction   Resistance Training Performed Yes   VAD Patient? No   VAD patient   Has back up controller? No   Pain Assessment   Currently in Pain? No/denies   Pain Score 0-No pain   Multiple Pain Sites No      Capillary Blood Glucose: No results found for this or any previous visit (from the past 24 hour(s)).   Goals Met:  Independence with exercise equipment Exercise tolerated well No report of cardiac concerns or symptoms Strength training completed today  Goals Unmet:  Not Applicable  Comments: Check out 1200   Dr. Kate Sable is Medical Director for [place] and Pulmonary Rehab.

## 2015-11-01 NOTE — Progress Notes (Addendum)
EPIC Encounter for ICM Monitoring  Patient Name: [name] is a 75 y.o. male Date: 11/01/2015 Primary Care Physican: Mayra Neer, MD Primary Cardiologist: Aundra Dubin Electrophysiologist: Allred Dry Weight: unknown Bi-V Pacing:  97%       Heart Failure questions reviewed, pt asymptomatic.  Thoracic impedence decreased slightly below baseline suggesting fluid accumulation.  Patient advised he had office visit with Dr Aundra Dubin on 7/11 and it was noted he had a little fluid by Dr Aundra Dubin did not want to make any changes.   Recommendations: No changes.    ICM trend: 10/27/2015     Follow-up plan: ICM clinic phone appointment on 12/02/2015.  Copy of ICM check sent to device physician.   [name], RN 11/01/2015 1:49 PM

## 2015-11-03 ENCOUNTER — Encounter (HOSPITAL_COMMUNITY)
Admission: RE | Admit: 2015-11-03 | Discharge: 2015-11-03 | Disposition: A | Discharge status: Home / Self Care | Payer: Managed Care, Other (non HMO) | Source: Ambulatory Visit | Attending: Cardiology | Admitting: Cardiology

## 2015-11-03 DIAGNOSIS — I214 Non-ST elevation (NSTEMI) myocardial infarction: Secondary | ICD-10-CM

## 2015-11-03 DIAGNOSIS — Z955 Presence of coronary angioplasty implant and graft: Secondary | ICD-10-CM

## 2015-11-03 NOTE — Progress Notes (Signed)
Daily Session Note  Patient Details  Name: CORNEY KNIGHTON MRN: [id number] Date of Birth: [birth year] Referring Provider:        CARDIAC REHAB PHASE II EXERCISE from 09/24/2015 in [place]   Referring Provider  Dr. Aundra Dubin      Encounter Date: 11/03/2015  Check In:     Session Check In - 11/03/15 1100    Check-In   Location [facility]   Staff Present Diane Angelina Pih, MS, EP, CHC, Exercise Physiologist;Cortlyn Cannell Luther Parody, BS, EP, Exercise Physiologist;Debra Wynetta Emery, RN, BSN   Supervising physician immediately available to respond to emergencies See telemetry face sheet for immediately available MD   Medication changes reported     No   Fall or balance concerns reported    No   Warm-up and Cool-down Performed as group-led instruction   Resistance Training Performed Yes   VAD Patient? No   VAD patient   Has back up controller? No   Pain Assessment   Currently in Pain? No/denies   Pain Score 0-No pain   Multiple Pain Sites No      Capillary Blood Glucose: No results found for this or any previous visit (from the past 24 hour(s)).   Goals Met:  Independence with exercise equipment Exercise tolerated well No report of cardiac concerns or symptoms Strength training completed today  Goals Unmet:  Not Applicable  Comments: Check out 1200   Dr. Kate Sable is Medical Director for [place] and Pulmonary Rehab.

## 2015-11-05 ENCOUNTER — Encounter (HOSPITAL_COMMUNITY)
Admission: RE | Admit: 2015-11-05 | Discharge: 2015-11-05 | Disposition: A | Discharge status: Home / Self Care | Payer: Managed Care, Other (non HMO) | Source: Ambulatory Visit | Attending: Cardiology | Admitting: Cardiology

## 2015-11-05 ENCOUNTER — Ambulatory Visit (INDEPENDENT_AMBULATORY_CARE_PROVIDER_SITE_OTHER): Payer: Managed Care, Other (non HMO) | Admitting: Endocrinology

## 2015-11-05 VITALS — BP 120/60 | HR 86 | Ht 70.0 in | Wt 205.0 lb

## 2015-11-05 DIAGNOSIS — I214 Non-ST elevation (NSTEMI) myocardial infarction: Secondary | ICD-10-CM

## 2015-11-05 DIAGNOSIS — I255 Ischemic cardiomyopathy: Secondary | ICD-10-CM

## 2015-11-05 DIAGNOSIS — E1165 Type 2 diabetes mellitus with hyperglycemia: Secondary | ICD-10-CM

## 2015-11-05 DIAGNOSIS — Z794 Long term (current) use of insulin: Secondary | ICD-10-CM

## 2015-11-05 DIAGNOSIS — Z955 Presence of coronary angioplasty implant and graft: Secondary | ICD-10-CM | POA: Diagnosis not present

## 2015-11-05 MED ORDER — METFORMIN HCL 1000 MG PO TABS: 1000.0000 mg | ORAL_TABLET | Freq: Every day | ORAL | Status: DC

## 2015-11-05 MED ORDER — INSULIN LISPRO PROT & LISPRO (50-50 MIX) 100 UNIT/ML KWIKPEN: PEN_INJECTOR | SUBCUTANEOUS | Status: DC

## 2015-11-05 MED ORDER — ONETOUCH ULTRA BLUE VI STRP: ORAL_STRIP | Status: DC

## 2015-11-05 NOTE — Patient Instructions (Signed)
Take insulin 5-10 min before meals  Check blood sugars on waking up 3  times a week Also check blood sugars about 2 hours after a meal and do this after different meals by rotation  Recommended blood sugar levels on waking up is 90-130 and about 2 hours after meal is 130-160  Please bring your blood sugar monitor to each visit, thank you

## 2015-11-05 NOTE — Progress Notes (Signed)
Daily Session Note  Patient Details  Name: [name] MRN: [id number] Date of Birth: Mar 29, 1941 Referring Provider:        CARDIAC REHAB PHASE II EXERCISE from 09/24/2015 in [place]   Referring Provider  Dr. Aundra Dubin      Encounter Date: 11/05/2015  Check In:     Session Check In - 11/05/15 1100    Check-In   Location [facility]   Staff Present Diane Angelina Pih, MS, EP, CHC, Exercise Physiologist;Kelty Szafran Luther Parody, BS, EP, Exercise Physiologist;Debra Wynetta Emery, RN, BSN   Supervising physician immediately available to respond to emergencies See telemetry face sheet for immediately available MD   Medication changes reported     No   Fall or balance concerns reported    No   Warm-up and Cool-down Performed as group-led instruction   Resistance Training Performed Yes   VAD Patient? No   VAD patient   Has back up controller? No   Pain Assessment   Currently in Pain? No/denies   Pain Score 0-No pain   Multiple Pain Sites No      Capillary Blood Glucose: No results found for this or any previous visit (from the past 24 hour(s)).   Goals Met:  Independence with exercise equipment Exercise tolerated well No report of cardiac concerns or symptoms Strength training completed today  Goals Unmet:  Not Applicable  Comments: Check out 1200   Dr. Kate Sable is Medical Director for [place] and Pulmonary Rehab.

## 2015-11-05 NOTE — Progress Notes (Signed)
Patient ID: [name], male   DOB: [birth year], 75 y.o.   MRN: [id number]           Reason for Appointment: Follow-up for Type 2 Diabetes  Referring physician: Brigitte Pulse  History of Present Illness:          Date of diagnosis of type 2 diabetes mellitus: 1997       Background history:  He was initially treated with oral agents and also at some point was given Victoza. Most of his diabetes treatment has been with insulin which he has taken since at least 2005 Although he had improved control with Victoza he stopped this as he started having some low sugars Although he was tried on metformin in 2012 this had been subsequently stopped because of renal dysfunction He was initially treated with 70/30 insulin but subsequently was given Apidra and Lantus He was taking about 90 units of Lantus daily in split doses and 35-40 units Apidra previously with each meal He thinks that about a year ago one of his physicians  In 2015 this was changed to  Humalog 50/50  Recent history:  INSULIN regimen is described as: Humalog 50/50  15-15-30   Current management, blood sugar patterns and problems identified:  He has started back on his insulin but his blood sugars appear to be still high  He was reporting decreased appetite on his last visit but is eating better now and is gaining back weight.  On his own he has increased his insulin especially at suppertime since his blood sugars are higher.  However he is checking his blood sugars primarily about an hour after eating  Not clear why his taking his insulin 1-1.5 hours after his meals rather than before and also will not take insulin when he is going out to eat including today  Blood sugars are being checked mostly after supper and these are frequently over 200 and average of 238  He has only a couple of readings in the mornings which are mildly increased  His A1c has been over 10% since 06/2014, previously in 2015 was lower and now up to  9.2  Compliance with the medical regimen:  fair  Hypoglycemia:   none, he thinks his lowest blood sugar has been about 84  Glucose monitoring:  not done        Glucometer:  Bayer     Blood Glucose readings    Mean values apply above for all meters except median for One Touch  PRE-MEAL Fasting Lunch Dinner Bedtime Overall  Glucose range: 159-180       Mean/median:     236    POST-MEAL PC Breakfast PC Lunch PC Dinner  Glucose range:   129-316   Mean/median:   245     Self-care: The diet that the patient has been following is: None .     Meal times: Breakfast:10 am, Sometimes cereal or fruit Lunch:  not consistent  Dinner: 7 p.m.   Typical meal intake: Breakfast is cereal at 10 am                Dietician visit, most recent: 2013                Exercise:Gardening, treadmill, Walking about 3 days a week  Weight  History:    Wt Readings from Last 3 Encounters:  11/05/15 205 lb (92.987 kg)  10/26/15 202 lb 4 oz (91.74 kg)  10/07/15 198 lb (89.812 kg)  Glycemic control:   Lab Results  Component Value Date   HGBA1C 9.2* 10/04/2015   HGBA1C 8.9* 01/05/2012   HGBA1C 7.9* 08/25/2011   Lab Results  Component Value Date   LDLCALC 80 02/17/2015   CREATININE 1.35* 10/26/2015    Last Creatinine 2.9, Last microalbumin/creatinine ratio 112 in 3/16     Medication List       This list is accurate as of: 11/05/15 11:59 PM.  Always use your most recent med list.               allopurinol 100 MG tablet  Commonly known as:  ZYLOPRIM  Take 100 mg by mouth 2 (two) times daily.     aspirin EC 81 MG tablet  Take 81 mg by mouth daily.     atorvastatin 80 MG tablet  Commonly known as:  LIPITOR  Take 1 tablet (80 mg total) by mouth daily at 6 PM.     carvedilol 12.5 MG tablet  Commonly known as:  COREG  Take 1.5 tablets (18.75 mg total) by mouth 2 (two) times daily with a meal.     clopidogrel 75 MG tablet  Commonly known as:  PLAVIX  Take 1 tablet (75 mg total) by  mouth daily.     colchicine 0.6 MG tablet  Take 1 tablet (0.6 mg total) by mouth daily as needed (for gout flare).     eplerenone 25 MG tablet  Commonly known as:  INSPRA  Take 1 tablet (25 mg total) by mouth every evening.     furosemide 40 MG tablet  Commonly known as:  LASIX  Take 40 mg by mouth 2 (two) times daily.     Insulin Lispro Prot & Lispro (50-50) 100 UNIT/ML Kwikpen  Commonly known as:  HUMALOG MIX 50/50 KWIKPEN  15 units at breakfast and lunch, 30 units before supper.  Take insulin 10 minutes before eating     Insulin Pen Needle 32G X 4 MM Misc  Commonly known as:  RELION PEN NEEDLES  Use to inject insulin 3 times per day.     isosorbide mononitrate 30 MG 24 hr tablet  Commonly known as:  IMDUR  Take 1 tablet (30 mg total) by mouth at bedtime.     lisinopril 5 MG tablet  Commonly known as:  PRINIVIL,ZESTRIL  Take 1 tablet (5 mg total) by mouth daily.     metFORMIN 1000 MG tablet  Commonly known as:  GLUCOPHAGE  Take 1 tablet (1,000 mg total) by mouth daily with supper.     nitroGLYCERIN 0.4 MG SL tablet  Commonly known as:  NITROSTAT  Place 1 tablet (0.4 mg total) under the tongue every 5 (five) minutes x 3 doses as needed. For chest pain.     ONE TOUCH ULTRA TEST test strip  Generic drug:  glucose blood  Use to check blood sugar 3 times per day.     ONETOUCH DELICA LANCETS 99991111 Misc     pantoprazole 40 MG tablet  Commonly known as:  PROTONIX  Take 1 tablet (40 mg total) by mouth daily.     potassium chloride SA 20 MEQ tablet  Commonly known as:  K-DUR,KLOR-CON  Take 1 tablet (20 mEq total) by mouth daily.     RANEXA 1000 MG SR tablet  Generic drug:  ranolazine  Take 500 mg by mouth 2 (two) times daily.     venlafaxine XR 150 MG 24 hr capsule  Commonly known as:  EFFEXOR-XR  Take  150 mg by mouth at bedtime.     zolpidem 12.5 MG CR tablet  Commonly known as:  AMBIEN CR  Take 12.5 mg by mouth at bedtime as needed. For sleep         Allergies:  Allergies  Allergen Reactions  . Codeine Nausea And Vomiting  . Latex Rash    Specifies adhesive     Past Medical History  Diagnosis Date  . Ischemic cardiomyopathy     s/p ICD Implantation by Dr Leonia Reeves  . Chronic systolic dysfunction of left ventricle     EF 30%  . Hyperlipemia   . DJD (degenerative joint disease)   . Gout   . HTN (hypertension) 02/14/2011  . Depression   . Angina   . Myocardial infarction ([place]) 1997  . Cardiac defibrillator in situ   . DM (diabetes mellitus) ([place]) 02/14/2011  . CHF (congestive heart failure) ([place])     EF 30% by cath 05/2011  . ICD (implantable cardiac defibrillator) in place   . GERD (gastroesophageal reflux disease)   . Heart attack ([place])   . Complication of anesthesia     ONCE WITH BACK SURGERY DIFFICULT TO WAKE UP  . CAD (coronary artery disease)     s/p CABG 1997, PCI (BMS) of SVG to RCA 3/09, redo CABG 02/2011 with SVG to PDA, SVG to Lcx, s/p cath 2.2013 with ocluded SVT to left circ and patent SVG to RCA.  Cath 07/2013 Recent cath showed ischemic cardiomyopathy with LVEF less than 20%, with progressive LV dysfunction related to bypass graft failure, occlusion of the saphenous vein grafts placed in 2012 to the right coronary and to the circum  . Paroxysmal atrial fibrillation (HCC) 10/22/14    single episode of AF x 3 hours 48 minutes recorded on ICD, chads2vasc score is at least 5     Past Surgical History  Procedure Laterality Date  . Cardiac defibrillator placement  08/2004    initial placement, upgraded to [place] ICD by Dr Lovena Le 08/24/11 (MDT)  . Knee arthrotomy  ~ 1978    RIGHT KNEE CARTILAGE REMOVED  . Coronary angioplasty with stent placement  06/2007    BMS to SVG to RCA  . Coronary angioplasty  02/2011  . Cataract extraction w/ intraocular lens  implant, bilateral  2012  . Cardiac defibrillator placement  2009  . Lumbar disc surgery  2003  . Back surgery  2003  . Coronary artery bypass graft  01/1996    CABG  x 5 LIMA to LAD SVG to diag1,2,svg to om,svg to RCA  . Coronary artery bypass graft  03/06/2011    CABG X2; Procedure: REDO CORONARY ARTERY BYPASS GRAFTING (CABG);  Surgeon: Grace Isaac, MD;  Location: [facility];  Service: Open Heart Surgery;  Laterality: N/A;  times two grafts using right saphenous vein harvested endoscopically.  . Cholecystectomy  01/05/2012    Procedure: LAPAROSCOPIC CHOLECYSTECTOMY WITH INTRAOPERATIVE CHOLANGIOGRAM;  Surgeon: Joyice Faster. Cornett, MD;  Location: [facility];  Service: General;  Laterality: N/A;  laparoscopic cholecysectoym with intraoperative cholangiogram  . Colonoscopy with propofol N/A 11/18/2013    Procedure: COLONOSCOPY WITH PROPOFOL;  Surgeon: Garlan Fair, MD;  Location: [facility];  Service: Endoscopy;  Laterality: N/A;  . Left heart catheterization with coronary angiogram N/A 06/06/2011    Procedure: LEFT HEART CATHETERIZATION WITH CORONARY ANGIOGRAM;  Surgeon: Sueanne Margarita, MD;  Location: [facility] CATH LAB;  Service: Cardiovascular;  Laterality: N/A;  . Venogram N/A 06/08/2011  Procedure: VENOGRAM;  Surgeon: Thompson Grayer, MD;  Location: [facility] CATH LAB;  Service: Cardiovascular;  Laterality: N/A;  . Bi-ventricular implantable cardioverter defibrillator upgrade N/A 08/24/2011    Procedure: BI-VENTRICULAR IMPLANTABLE CARDIOVERTER DEFIBRILLATOR UPGRADE;  Surgeon: Evans Lance, MD;  Location: [facility] CATH LAB;  Service: Cardiovascular;  Laterality: N/A;  . Left heart catheterization with coronary/graft angiogram N/A 08/08/2013    Procedure: LEFT HEART CATHETERIZATION WITH Beatrix Fetters;  Surgeon: Sinclair Grooms, MD;  Location: [facility] CATH LAB;  Service: Cardiovascular;  Laterality: N/A;  . Right heart catheterization N/A 02/17/2014    Procedure: RIGHT HEART CATH;  Surgeon: Larey Dresser, MD;  Location: [facility] CATH LAB;  Service: Cardiovascular;  Laterality: N/A;  . Cardiac catheterization  08/03/2015    Procedure: Left Heart Cath and Cors/Grafts Angiography;   Surgeon: Peter M Martinique, MD;  Location: [place] CV LAB;  Service: Cardiovascular;;  . Cardiac catheterization N/A 08/06/2015    Procedure: Coronary Stent Intervention w/Impella;  Surgeon: Jettie Booze, MD;  Location: [place] CV LAB;  Service: Cardiovascular;  Laterality: N/A;  . Cardiac catheterization  08/06/2015    Procedure: Left Heart Cath;  Surgeon: Jettie Booze, MD;  Location: [place] CV LAB;  Service: Cardiovascular;;  . Cardiac catheterization  08/06/2015    Procedure: Coronary Balloon Angioplasty;  Surgeon: Jettie Booze, MD;  Location: [place] CV LAB;  Service: Cardiovascular;;    Family History  Problem Relation Age of Onset  . Coronary artery disease    . Heart failure Mother   . Heart failure Father   . Heart attack Mother   . Heart attack Father   . Sudden Cardiac Death Brother     in his 51's  . Heart attack Brother   . Cancer Brother     Social History:  reports that he quit smoking about 46 years ago. His smoking use included Cigarettes. He has a 7.5 pack-year smoking history. He quit smokeless tobacco use about 44 years ago. He reports that he drinks about 0.6 oz of alcohol per week. He reports that he does not use illicit drugs.    Review of Systems    Lipid history: He is on Lipitor 80 mg Has history of CAD    Lab Results  Component Value Date   CHOL 147 02/17/2015   HDL 32* 02/17/2015   LDLCALC 80 02/17/2015   TRIG 176* 02/17/2015   CHOLHDL 4.6 02/17/2015           Last foot exam in 07/2015   Physical Examination:  BP 120/60 mmHg  Pulse 86  Ht 5\' 10"  (1.778 m)  Wt 205 lb (92.987 kg)  BMI 29.41 kg/m2  SpO2 96%      ASSESSMENT:  Diabetes type 2, uncontrolled    See history of present illness for detailed discussion of current diabetes management, blood sugar patterns and problems identified  He appears to be requiring progressively higher doses of insulin now This is related to his getting his appetite back  and starting to gain his weight again His treatment regimen is limited by his difficulty affording medications  Although he has been on insulin previously for a few years he does not understand the need to take this before eating rather than about an hour after eating; he is waiting to check sugar after eating and then chasing the high reading  He has been on 50/50 insulin and difficult to know whether this is adequately controlling her diabetes He has  only a few readings in the mornings and none after his other meals  PLAN:   For simplicity will continue the same insulin with 50/50 Humalog mix but change the timing of his insulin to before every meal  Consider adding a separate basal insulin fasting readings continue to be high  He is gaining weight and because of his insulin resistance will benefit from restarting metformin since his renal function is adequate and stable  Since he will benefit from metformin in the evening hours to help overnight blood sugars he will start with 1 g at suppertime for now  Discussed timing of glucose monitoring and blood sugar targets  Also consider consultation with dietitian to ensure that he is having balanced meals with no excess carbohydrate  Will defer A1c until his blood sugars are somewhat better controlled  Encouraged him to continue exercise regimen regularly, encouraged him to check blood sugar before going for  exercise  Patient Instructions  Take insulin 5-10 min before meals  Check blood sugars on waking up 3  times a week Also check blood sugars about 2 hours after a meal and do this after different meals by rotation  Recommended blood sugar levels on waking up is 90-130 and about 2 hours after meal is 130-160  Please bring your blood sugar monitor to each visit, thank you    Counseling time on subjects discussed above is over 50% of today's 25 minute visit  Jakyren Fluegge 11/06/2015, 5:12 PM   Note: This office note was prepared  with Dragon voice recognition system technology. Any transcriptional errors that result from this process are unintentional.

## 2015-11-08 ENCOUNTER — Encounter (HOSPITAL_COMMUNITY)
Admission: RE | Admit: 2015-11-08 | Discharge: 2015-11-08 | Disposition: A | Discharge status: Home / Self Care | Payer: Managed Care, Other (non HMO) | Source: Ambulatory Visit | Attending: Cardiology | Admitting: Cardiology

## 2015-11-08 DIAGNOSIS — Z955 Presence of coronary angioplasty implant and graft: Secondary | ICD-10-CM | POA: Diagnosis not present

## 2015-11-08 DIAGNOSIS — I214 Non-ST elevation (NSTEMI) myocardial infarction: Secondary | ICD-10-CM

## 2015-11-08 NOTE — Progress Notes (Signed)
Daily Session Note  Patient Details  Name: [name] MRN: [id number] Date of Birth: Aug 30, 1940 Referring Provider:   Flowsheet Row CARDIAC REHAB PHASE II EXERCISE from 09/24/2015 in [place]  Referring Provider  Dr. Aundra Dubin      Encounter Date: 11/08/2015  Check In:     Session Check In - 11/08/15 1100      Check-In   Location [facility]   Staff Present Diane Angelina Pih, MS, EP, CHC, Exercise Physiologist;Gregory Luther Parody, BS, EP, Exercise Physiologist;Marielle Mantione Wynetta Emery, RN, BSN   Supervising physician immediately available to respond to emergencies See telemetry face sheet for immediately available MD   Medication changes reported     No   Fall or balance concerns reported    No   Warm-up and Cool-down Performed as group-led instruction   Resistance Training Performed Yes   VAD Patient? No     VAD patient   Has back up controller? No     Pain Assessment   Currently in Pain? No/denies   Pain Score 0-No pain   Multiple Pain Sites No      Capillary Blood Glucose: No results found for this or any previous visit (from the past 24 hour(s)).   Goals Met:  Independence with exercise equipment Exercise tolerated well No report of cardiac concerns or symptoms Strength training completed today  Goals Unmet:  Not Applicable  Comments: Check out 1200   Dr. Kate Sable is Medical Director for [facility] and Pulmonary Rehab.

## 2015-11-09 ENCOUNTER — Telehealth: Payer: Self-pay | Admitting: Endocrinology

## 2015-11-09 NOTE — Telephone Encounter (Signed)
There is no alternative.  He can take half tablet at lunch and half tablet at dinner with food

## 2015-11-09 NOTE — Telephone Encounter (Signed)
PT wife [name], stated that the Metformin is making PT chest hurt since taking it, would like to know if there is an alternative. CB# [phone]

## 2015-11-10 ENCOUNTER — Encounter (HOSPITAL_COMMUNITY)
Admission: RE | Admit: 2015-11-10 | Discharge: 2015-11-10 | Disposition: A | Discharge status: Home / Self Care | Payer: Managed Care, Other (non HMO) | Source: Ambulatory Visit | Attending: Cardiology | Admitting: Cardiology

## 2015-11-10 DIAGNOSIS — Z955 Presence of coronary angioplasty implant and graft: Secondary | ICD-10-CM

## 2015-11-10 DIAGNOSIS — I214 Non-ST elevation (NSTEMI) myocardial infarction: Secondary | ICD-10-CM

## 2015-11-10 NOTE — Telephone Encounter (Signed)
Called and left message on phone that there was no alternative for the metformin, that Dr.Kumar has advised to take half at [place] dn half a tab at dinner with food, if patient had any questions to call back, gave call back number.

## 2015-11-10 NOTE — Progress Notes (Signed)
Daily Session Note  Patient Details  Name: [name] MRN: [id number] Date of Birth: 05-20-1940 Referring Provider:   Flowsheet Row CARDIAC REHAB PHASE II EXERCISE from 09/24/2015 in [place]  Referring Provider  Dr. Aundra Dubin      Encounter Date: 11/10/2015  Check In:     Session Check In - 11/10/15 1100      Check-In   Location [facility]   Staff Present Diane Angelina Pih, MS, EP, CHC, Exercise Physiologist;Jerusha Reising Luther Parody, BS, EP, Exercise Physiologist;Debra Wynetta Emery, RN, BSN   Supervising physician immediately available to respond to emergencies See telemetry face sheet for immediately available MD   Medication changes reported     No   Fall or balance concerns reported    No   Warm-up and Cool-down Performed as group-led instruction   Resistance Training Performed Yes   VAD Patient? No     VAD patient   Has back up controller? No     Pain Assessment   Currently in Pain? No/denies   Pain Score 0-No pain   Multiple Pain Sites No      Capillary Blood Glucose: No results found for this or any previous visit (from the past 24 hour(s)).   Goals Met:  Independence with exercise equipment Exercise tolerated well No report of cardiac concerns or symptoms Strength training completed today  Goals Unmet:  Not Applicable  Comments: Check out 1200   Dr. Kate Sable is Medical Director for [facility] and Pulmonary Rehab.

## 2015-11-11 NOTE — Progress Notes (Signed)
Cardiac Individual Treatment Plan  Patient Details  Name: [name] MRN: [id number] Date of Birth: [birth year] Referring Provider:   Flowsheet Row CARDIAC REHAB PHASE II EXERCISE from 09/24/2015 in [place]  Referring Provider  Dr. Aundra Dubin      Initial Encounter Date:  Flowsheet Row CARDIAC REHAB PHASE II EXERCISE from 09/24/2015 in [place]  Date  09/24/15  Referring Provider  Dr. Aundra Dubin      Visit Diagnosis: Stented coronary artery  NSTEMI (non-ST elevated myocardial infarction) [facility])  Patient's Home Medications on Admission:  Current Outpatient Prescriptions:  .  allopurinol (ZYLOPRIM) 100 MG tablet, Take 100 mg by mouth 2 (two) times daily. , Disp: , Rfl:  .  aspirin EC 81 MG tablet, Take 81 mg by mouth daily., Disp: , Rfl:  .  atorvastatin (LIPITOR) 80 MG tablet, Take 1 tablet (80 mg total) by mouth daily at 6 PM., Disp: 30 tablet, Rfl: 5 .  carvedilol (COREG) 12.5 MG tablet, Take 1.5 tablets (18.75 mg total) by mouth 2 (two) times daily with a meal., Disp: 90 tablet, Rfl: 5 .  clopidogrel (PLAVIX) 75 MG tablet, Take 1 tablet (75 mg total) by mouth daily., Disp: 90 tablet, Rfl: 3 .  colchicine 0.6 MG tablet, Take 1 tablet (0.6 mg total) by mouth daily as needed (for gout flare)., Disp: 30 tablet, Rfl: 6 .  eplerenone (INSPRA) 25 MG tablet, Take 1 tablet (25 mg total) by mouth every evening., Disp: 30 tablet, Rfl: 6 .  furosemide (LASIX) 40 MG tablet, Take 40 mg by mouth 2 (two) times daily., Disp: , Rfl:  .  Insulin Lispro Prot & Lispro (HUMALOG MIX 50/50 KWIKPEN) (50-50) 100 UNIT/ML Kwikpen, 15 units at breakfast and lunch, 30 units before supper.  Take insulin 10 minutes before eating, Disp: 15 mL, Rfl: 0 .  Insulin Pen Needle (RELION PEN NEEDLES) 32G X 4 MM MISC, Use to inject insulin 3 times per day., Disp: 150 each, Rfl: 2 .  isosorbide mononitrate (IMDUR) 30 MG 24 hr tablet, Take 1 tablet (30 mg total) by mouth at bedtime.,  Disp: 30 tablet, Rfl: 5 .  lisinopril (PRINIVIL,ZESTRIL) 5 MG tablet, Take 1 tablet (5 mg total) by mouth daily., Disp: 30 tablet, Rfl: 3 .  metFORMIN (GLUCOPHAGE) 1000 MG tablet, Take 1 tablet (1,000 mg total) by mouth daily with supper., Disp: 30 tablet, Rfl: 3 .  nitroGLYCERIN (NITROSTAT) 0.4 MG SL tablet, Place 1 tablet (0.4 mg total) under the tongue every 5 (five) minutes x 3 doses as needed. For chest pain. (Patient not taking: Reported on 10/26/2015), Disp: 25 tablet, Rfl: 3 .  ONE TOUCH ULTRA TEST test strip, Use to check blood sugar 3 times per day., Disp: 150 each, Rfl: 2 .  ONETOUCH DELICA LANCETS [id number] MISC, , Disp: , Rfl:  .  pantoprazole (PROTONIX) 40 MG tablet, Take 1 tablet (40 mg total) by mouth daily., Disp: 30 tablet, Rfl: 11 .  potassium chloride SA (K-DUR,KLOR-CON) 20 MEQ tablet, Take 1 tablet (20 mEq total) by mouth daily., Disp: 30 tablet, Rfl: 5 .  ranolazine (RANEXA) 1000 MG SR tablet, Take 500 mg by mouth 2 (two) times daily., Disp: , Rfl:  .  venlafaxine (EFFEXOR-XR) 150 MG 24 hr capsule, Take 150 mg by mouth at bedtime. , Disp: , Rfl:  .  zolpidem (AMBIEN CR) 12.5 MG CR tablet, Take 12.5 mg by mouth at bedtime as needed. For sleep, Disp: , Rfl:   Past Medical  History: Past Medical History:  Diagnosis Date  . Angina   . CAD (coronary artery disease)    s/p CABG 1997, PCI (BMS) of SVG to RCA 3/09, redo CABG 02/2011 with SVG to PDA, SVG to Lcx, s/p cath 2.2013 with ocluded SVT to left circ and patent SVG to RCA.  Cath 07/2013 Recent cath showed ischemic cardiomyopathy with LVEF less than 20%, with progressive LV dysfunction related to bypass graft failure, occlusion of the saphenous vein grafts placed in 2012 to the right coronary and to the circum  . Cardiac defibrillator in situ   . CHF (congestive heart failure) ([place])    EF 30% by cath 05/2011  . Chronic systolic dysfunction of left ventricle    EF 30%  . Complication of anesthesia    ONCE WITH BACK SURGERY DIFFICULT  TO WAKE UP  . Depression   . DJD (degenerative joint disease)   . DM (diabetes mellitus) ([place]) 02/14/2011  . GERD (gastroesophageal reflux disease)   . Gout   . Heart attack ([place])   . HTN (hypertension) 02/14/2011  . Hyperlipemia   . ICD (implantable cardiac defibrillator) in place   . Ischemic cardiomyopathy    s/p ICD Implantation by Dr Leonia Reeves  . Myocardial infarction ([place]) 1997  . Paroxysmal atrial fibrillation ([facility]) 10/22/14   single episode of AF x 3 hours 48 minutes recorded on ICD, chads2vasc score is at least 5     Tobacco Use: History  Smoking Status  . Former Smoker  . Packs/day: 0.50  . Years: 15.00  . Types: Cigarettes  . Quit date: 04/17/1969  Smokeless Tobacco  . Former Systems developer  . Quit date: 05/05/1971    Labs: Recent Review Flowsheet Data    Labs for ITP Cardiac and Pulmonary Rehab Latest Ref Rng & Units 02/17/2014 02/17/2014 06/29/2014 02/17/2015 10/04/2015   Cholestrol 0 - 200 mg/dL - - 116 147 -   LDLCALC 0 - 99 mg/dL - - 46 80 -   HDL >40 mg/dL - - 35.50(L) 32(L) -   Trlycerides <150 mg/dL - - 174.0(H) 176(H) -   Hemoglobin A1c 4.6 - 6.5 % - - - - 9.2(H)   PHART 7.350 - 7.450 - 7.412 - - -   PCO2ART 35.0 - 45.0 mmHg - 40.2 - - -   HCO3 20.0 - 24.0 mEq/L 26.3(H) 25.6(H) - - -   TCO2 0 - 100 mmol/L 28 27 - - -   ACIDBASEDEF 0.0 - 2.0 mmol/L - - - - -   O2SAT % 71.0 98.0 - - -      Capillary Blood Glucose: Lab Results  Component Value Date   GLUCAP 242 (H) 08/08/2015   GLUCAP 148 (H) 08/08/2015   GLUCAP 241 (H) 08/07/2015   GLUCAP 238 (H) 08/07/2015   GLUCAP 205 (H) 08/07/2015     Exercise Target Goals:    Exercise Program Goal: Individual exercise prescription set with THRR, safety & activity barriers. Participant demonstrates ability to understand and report RPE using BORG scale, to self-measure pulse accurately, and to acknowledge the importance of the exercise prescription.  Exercise Prescription Goal: Starting with aerobic activity 30 plus  minutes a day, 3 days per week for initial exercise prescription. Provide home exercise prescription and guidelines that participant acknowledges understanding prior to discharge.  Activity Barriers & Risk Stratification:     Activity Barriers & Cardiac Risk Stratification - 09/24/15 1539      Activity Barriers & Cardiac Risk Stratification   Activity Barriers  None   Cardiac Risk Stratification High      6 Minute Walk:     6 Minute Walk    Row Name 09/24/15 1449         6 Minute Walk   Phase Initial     Distance 1025 feet     Walk Time 6 minutes     # of Rest Breaks 0     MPH 1.94     METS 2.48     RPE 11     Perceived Dyspnea  11     VO2 Peak 8.19     Symptoms No     Resting HR 89 bpm     Resting BP 120/60     Max Ex. HR 114 bpm     Max Ex. BP 156/66     2 Minute Post BP 124/64        Initial Exercise Prescription:     Initial Exercise Prescription - 09/24/15 1400      Date of Initial Exercise RX and Referring Provider   Date 09/24/15   Referring Provider Dr. Aundra Dubin     Treadmill   MPH 1.9   Grade 0   Minutes 15   METs 1.9     NuStep   Level 2   Watts 22   Minutes 15   METs 1.9     Arm Ergometer   Level 2   Watts 23   Minutes 15   METs 2     Prescription Details   Frequency (times per week) 3   Duration Progress to 30 minutes of continuous aerobic without signs/symptoms of physical distress     Intensity   THRR REST +  30   THRR 40-80% of Max Heartrate [phone]   Ratings of Perceived Exertion 11-13   Perceived Dyspnea 0-4     Progression   Progression Continue to progress workloads to maintain intensity without signs/symptoms of physical distress.     Resistance Training   Training Prescription Yes   Weight 1   Reps 10-12      Perform Capillary Blood Glucose checks as needed.  Exercise Prescription Changes:      Exercise Prescription Changes    Row Name 10/11/15 1800 10/13/15 1400 10/18/15 1200 11/03/15 1200        Exercise Review   Progression  - Yes Yes Yes      Response to Exercise   Blood Pressure (Admit) 120/62 130/60 118/60 96/50    Blood Pressure (Exercise) 128/68 136/60 110/70 110/62    Blood Pressure (Exit) 110/60 126/60 120/60 100/56    Heart Rate (Admit) 90 bpm 84 bpm 84 bpm 86 bpm    Heart Rate (Exercise) 99 bpm 101 bpm 103 bpm 95 bpm    Heart Rate (Exit) 88 bpm 92 bpm 93 bpm 86 bpm    Rating of Perceived Exertion (Exercise) 11 11 11 10     Duration Progress to 30 minutes of continuous aerobic without signs/symptoms of physical distress Progress to 30 minutes of continuous aerobic without signs/symptoms of physical distress Progress to 30 minutes of continuous aerobic without signs/symptoms of physical distress Progress to 30 minutes of continuous aerobic without signs/symptoms of physical distress    Intensity Rest + 30 Rest + 30 Rest + 30 Rest + 30      Progression   Progression Continue to progress workloads to maintain intensity without signs/symptoms of physical distress. Continue to progress workloads to maintain intensity without signs/symptoms of physical distress.  Continue to progress workloads to maintain intensity without signs/symptoms of physical distress. Continue to progress workloads to maintain intensity without signs/symptoms of physical distress.      Resistance Training   Training Prescription Yes Yes Yes Yes    Weight 2 2 2 2     Reps 10-12 10-12 10-12 10-12      Treadmill   MPH 1.4 1.5 1.5 1.6    Grade 0 0.5 0 0    Minutes 15 15 15 15     METs 2 2.2 2.1 2.2      NuStep   Level 2 2 2 3     Watts 14 14 12 22     Minutes 20 20 20 20     METs 3.62 3.62 3.62 3.62      Home Exercise Plan   Plans to continue exercise at [place]    Frequency Add 2 additional days to program exercise sessions. Add 2 additional days to program exercise sessions. Add 2 additional days to program exercise sessions. Add 2 additional days to program exercise sessions.        Exercise Comments:      Exercise Comments    Row Name 11/10/15 [id number]           Exercise Comments Patient is proggressing appropriately            Discharge Exercise Prescription (Final Exercise Prescription Changes):     Exercise Prescription Changes - 11/03/15 1200      Exercise Review   Progression Yes     Response to Exercise   Blood Pressure (Admit) 96/50   Blood Pressure (Exercise) 110/62   Blood Pressure (Exit) 100/56   Heart Rate (Admit) 86 bpm   Heart Rate (Exercise) 95 bpm   Heart Rate (Exit) 86 bpm   Rating of Perceived Exertion (Exercise) 10   Duration Progress to 30 minutes of continuous aerobic without signs/symptoms of physical distress   Intensity Rest + 30     Progression   Progression Continue to progress workloads to maintain intensity without signs/symptoms of physical distress.     Resistance Training   Training Prescription Yes   Weight 2   Reps 10-12     Treadmill   MPH 1.6   Grade 0   Minutes 15   METs 2.2     NuStep   Level 3   Watts 22   Minutes 20   METs 3.62     Home Exercise Plan   Plans to continue exercise at Home   Frequency Add 2 additional days to program exercise sessions.      Nutrition:  Target Goals: Understanding of nutrition guidelines, daily intake of sodium 1500mg , cholesterol 200mg , calories 30% from fat and 7% or less from saturated fats, daily to have 5 or more servings of fruits and vegetables.  Biometrics:     Pre Biometrics - 09/24/15 1452      Pre Biometrics   Height 5\' 7"  (1.702 m)   Weight 205 lb 0.4 oz (93 kg)   Waist Circumference 43 inches   Hip Circumference 43 inches   Waist to Hip Ratio 1 %   BMI (Calculated) 32.2   Triceps Skinfold 15 mm   % Body Fat 30.9 %   Grip Strength 70 kg   Flexibility 13.7 in   Single Leg Stand 3 seconds       Nutrition Therapy Plan and Nutrition Goals:   Nutrition Discharge: Rate Your Plate Scores:  Nutrition Assessments - 09/24/15 1546       MEDFICTS Scores   Pre Score 27      Nutrition Goals Re-Evaluation:   Psychosocial: Target Goals: Acknowledge presence or absence of depression, maximize coping skills, provide positive support system. Participant is able to verbalize types and ability to use techniques and skills needed for reducing stress and depression.  Initial Review & Psychosocial Screening:     Initial Psych Review & Screening - 09/24/15 1649      Initial Review   Current issues with --  No abuse or negelect issues.      Family Dynamics   Good Support System? Yes     Barriers   Psychosocial barriers to participate in program There are no identifiable barriers or psychosocial needs.     Screening Interventions   Interventions Encouraged to exercise      Quality of Life Scores:     Quality of Life - 09/24/15 1454      Quality of Life Scores   Health/Function Pre 24.17 %   Socioeconomic Pre 25.5 %   Psych/Spiritual Pre 24.14 %   Family Pre 27.75 %   GLOBAL Pre 24.86 %      PHQ-9: Recent Review Flowsheet Data    Depression screen [facility] 2/9 09/24/2015   Decreased Interest 0   Down, Depressed, Hopeless 1   PHQ - 2 Score 1   Altered sleeping 2   Tired, decreased energy 2   Change in appetite 3   Feeling bad or failure about yourself  1   Trouble concentrating 0   Moving slowly or fidgety/restless 1   Suicidal thoughts 0   PHQ-9 Score 10   Difficult doing work/chores Somewhat difficult      Psychosocial Evaluation and Intervention:     Psychosocial Evaluation - 09/24/15 1650      Psychosocial Evaluation & Interventions   Interventions Encouraged to exercise with the program and follow exercise prescription;Stress management education;Relaxation education   Comments Patient is already seeing an counselor for his depression   Continued Psychosocial Services Needed Yes      Psychosocial Re-Evaluation:     Psychosocial Re-Evaluation    [place] Name 10/11/15 1850 11/11/15 0813            Psychosocial Re-Evaluation   Interventions Encouraged to attend Cardiac Rehabilitation for the exercise  -      Comments  - Patients does have some psychosocial evidenced by his goal of being able to be around other people and his PHQ-9 score was 10. His QOL score was 24.86. He is seeing a Social worker.       Continued Psychosocial Services Needed No Yes  Patient is seeing a Social worker.          Vocational Rehabilitation: Provide vocational rehab assistance to qualifying candidates.   Vocational Rehab Evaluation & Intervention:     Vocational Rehab - 09/24/15 1541      Initial Vocational Rehab Evaluation & Intervention   Assessment shows need for Vocational Rehabilitation No      Education: Education Goals: Education classes will be provided on a weekly basis, covering required topics. Participant will state understanding/return demonstration of topics presented.  Learning Barriers/Preferences:     Learning Barriers/Preferences - 09/24/15 1539      Learning Barriers/Preferences   Learning Barriers None   Learning Preferences Skilled Demonstration      Education Topics: Hypertension, Hypertension Reduction -Define heart disease and high blood pressure. Discus how high blood pressure affects  the body and ways to reduce high blood pressure.   Exercise and Your Heart -Discuss why it is important to exercise, the FITT principles of exercise, normal and abnormal responses to exercise, and how to exercise safely.   Angina -Discuss definition of angina, causes of angina, treatment of angina, and how to decrease risk of having angina.   Cardiac Medications -Review what the following cardiac medications are used for, how they affect the body, and side effects that may occur when taking the medications.  Medications include Aspirin, Beta blockers, calcium channel blockers, ACE Inhibitors, angiotensin receptor blockers, diuretics, digoxin, and  antihyperlipidemics.   Congestive Heart Failure -Discuss the definition of CHF, how to live with CHF, the signs and symptoms of CHF, and how keep track of weight and sodium intake.   Heart Disease and Intimacy -Discus the effect sexual activity has on the heart, how changes occur during intimacy as we age, and safety during sexual activity.   Smoking Cessation / COPD -Discuss different methods to quit smoking, the health benefits of quitting smoking, and the definition of COPD. Flowsheet Row CARDIAC REHAB PHASE II EXERCISE from 11/03/2015 in [place]  Date  09/29/15  Educator  Russella Dar  Instruction Review Code  2- meets goals/outcomes      Nutrition I: Fats -Discuss the types of cholesterol, what cholesterol does to the heart, and how cholesterol levels can be controlled. Flowsheet Row CARDIAC REHAB PHASE II EXERCISE from 11/03/2015 in [place]  Date  10/06/15  Educator  Russella Dar  Instruction Review Code  2- meets goals/outcomes      Nutrition II: Labels -Discuss the different components of food labels and how to read food label [place] from 11/03/2015 in [place]  Date  10/13/15  Educator  Russella Dar  Instruction Review Code  2- meets goals/outcomes      Heart Parts and Heart Disease -Discuss the anatomy of the heart, the pathway of blood circulation through the heart, and these are affected by heart disease.   Stress I: Signs and Symptoms -Discuss the causes of stress, how stress may lead to anxiety and depression, and ways to limit stress.   Stress II: Relaxation -Discuss different types of relaxation techniques to limit stress. Flowsheet Row CARDIAC REHAB PHASE II EXERCISE from 11/03/2015 in [place]  Date  11/03/15  Educator  DJ  Instruction Review Code  2- meets goals/outcomes      Warning Signs of Stroke / TIA -Discuss  definition of a stroke, what the signs and symptoms are of a stroke, and how to identify when someone is having stroke.   Knowledge Questionnaire Score:     Knowledge Questionnaire Score - 09/24/15 1540      Knowledge Questionnaire Score   Pre Score 21/24      Core Components/Risk Factors/Patient Goals at Admission:     Personal Goals and Risk Factors at Admission - 09/24/15 1546      Core Components/Risk Factors/Patient Goals on Admission    Weight Management Weight Maintenance   Increase Strength and Stamina Yes   Intervention Provide advice, education, support and counseling about physical activity/exercise needs.;Develop an individualized exercise prescription for aerobic and resistive training based on initial evaluation findings, risk stratification, comorbidities and participant's personal goals.   Diabetes Yes   Intervention Provide education about signs/symptoms and action to take for hypo/hyperglycemia.   Expected Outcomes Long Term: Attainment of HbA1C <  7%.;Short Term: Participant verbalizes understanding of the signs/symptoms and immediate care of hyper/hypoglycemia, proper foot care and importance of medication, aerobic/resistive exercise and nutrition plan for blood glucose control.   Personal Goal Other Yes   Personal Goal Get stronger, be around the people in the program.    Intervention Exercise 3 times week in program and supplement 2 more days at home.    Expected Outcomes Get stonger and gain new friends.       Core Components/Risk Factors/Patient Goals Review:      Goals and Risk Factor Review    Row Name 10/11/15 1848 11/11/15 0809           Core Components/Risk Factors/Patient Goals Review   Personal Goals Review Increase Strength and Stamina  Wants to be around people. Increase Strength and Stamina;Other  Be around other people.       Review Patient says he is getting stronger, he aslo enjoys being around his classmates.  Patient feels like the  program is helping him be comfortable being around other people. He is social with other patients and staff. His strength and stamina is increasing.       Expected Outcomes Get stronger, continue to enjoy the fellowship Continue to increase strength and stamina and have increased comfort being around other people.          Core Components/Risk Factors/Patient Goals at Discharge (Final Review):      Goals and Risk Factor Review - 11/11/15 0809      Core Components/Risk Factors/Patient Goals Review   Personal Goals Review Increase Strength and Stamina;Other  Be around other people.    Review Patient feels like the program is helping him be comfortable being around other people. He is social with other patients and staff. His strength and stamina is increasing.    Expected Outcomes Continue to increase strength and stamina and have increased comfort being around other people.       ITP Comments:   Comments: Patient doing well with program. Will continue to monitor for progress.

## 2015-11-12 ENCOUNTER — Encounter (HOSPITAL_COMMUNITY)
Admission: RE | Admit: 2015-11-12 | Discharge: 2015-11-12 | Disposition: A | Discharge status: Home / Self Care | Payer: Managed Care, Other (non HMO) | Source: Ambulatory Visit | Attending: Cardiology | Admitting: Cardiology

## 2015-11-12 DIAGNOSIS — I214 Non-ST elevation (NSTEMI) myocardial infarction: Secondary | ICD-10-CM

## 2015-11-12 DIAGNOSIS — Z955 Presence of coronary angioplasty implant and graft: Secondary | ICD-10-CM | POA: Diagnosis not present

## 2015-11-12 NOTE — Progress Notes (Signed)
Daily Session Note  Patient Details  Name: [name] MRN: [id number] Date of Birth: Sep 29, 1940 Referring Provider:   Flowsheet Row CARDIAC REHAB PHASE II EXERCISE from 09/24/2015 in [place]  Referring Provider  Dr. Aundra Dubin      Encounter Date: 11/12/2015  Check In:     Session Check In - 11/12/15 1100      Check-In   Location [facility]   Staff Present Diane Angelina Pih, MS, EP, CHC, Exercise Physiologist;Gregory Luther Parody, BS, EP, Exercise Physiologist;Tiffiany Beadles Wynetta Emery, RN, BSN   Supervising physician immediately available to respond to emergencies See telemetry face sheet for immediately available MD   Medication changes reported     No   Fall or balance concerns reported    No   Warm-up and Cool-down Performed as group-led instruction   Resistance Training Performed Yes   VAD Patient? No     Pain Assessment   Currently in Pain? No/denies   Pain Score 0-No pain   Multiple Pain Sites No      Capillary Blood Glucose: No results found for this or any previous visit (from the past 24 hour(s)).   Goals Met:  Independence with exercise equipment Exercise tolerated well No report of cardiac concerns or symptoms Strength training completed today    Goals Unmet:  Not Applicable  Comments: Patient c/o not sleeping well and feeling a little dizzy. He was not exercised on the treadmill. His b/p remained stable during exercise. He was monitored closely.  His dizziness subsided.  Check out 1200.   Dr. Kate Sable is Medical Director for [place].

## 2015-11-15 ENCOUNTER — Encounter (HOSPITAL_COMMUNITY): Payer: Managed Care, Other (non HMO)

## 2015-11-17 ENCOUNTER — Encounter (HOSPITAL_COMMUNITY)
Admission: RE | Admit: 2015-11-17 | Discharge: 2015-11-17 | Disposition: A | Discharge status: Home / Self Care | Payer: Managed Care, Other (non HMO) | Source: Ambulatory Visit | Attending: Cardiology | Admitting: Cardiology

## 2015-11-17 DIAGNOSIS — M1990 Unspecified osteoarthritis, unspecified site: Secondary | ICD-10-CM | POA: Insufficient documentation

## 2015-11-17 DIAGNOSIS — I2510 Atherosclerotic heart disease of native coronary artery without angina pectoris: Secondary | ICD-10-CM | POA: Diagnosis not present

## 2015-11-17 DIAGNOSIS — Z87891 Personal history of nicotine dependence: Secondary | ICD-10-CM | POA: Diagnosis not present

## 2015-11-17 DIAGNOSIS — I110 Hypertensive heart disease with heart failure: Secondary | ICD-10-CM | POA: Insufficient documentation

## 2015-11-17 DIAGNOSIS — I255 Ischemic cardiomyopathy: Secondary | ICD-10-CM | POA: Diagnosis not present

## 2015-11-17 DIAGNOSIS — K219 Gastro-esophageal reflux disease without esophagitis: Secondary | ICD-10-CM | POA: Diagnosis not present

## 2015-11-17 DIAGNOSIS — Z955 Presence of coronary angioplasty implant and graft: Secondary | ICD-10-CM | POA: Diagnosis present

## 2015-11-17 DIAGNOSIS — Z794 Long term (current) use of insulin: Secondary | ICD-10-CM | POA: Insufficient documentation

## 2015-11-17 DIAGNOSIS — E785 Hyperlipidemia, unspecified: Secondary | ICD-10-CM | POA: Insufficient documentation

## 2015-11-17 DIAGNOSIS — Z79899 Other long term (current) drug therapy: Secondary | ICD-10-CM | POA: Diagnosis not present

## 2015-11-17 DIAGNOSIS — I509 Heart failure, unspecified: Secondary | ICD-10-CM | POA: Insufficient documentation

## 2015-11-17 DIAGNOSIS — I480 Paroxysmal atrial fibrillation: Secondary | ICD-10-CM | POA: Insufficient documentation

## 2015-11-17 DIAGNOSIS — F329 Major depressive disorder, single episode, unspecified: Secondary | ICD-10-CM | POA: Diagnosis not present

## 2015-11-17 DIAGNOSIS — E119 Type 2 diabetes mellitus without complications: Secondary | ICD-10-CM | POA: Insufficient documentation

## 2015-11-17 DIAGNOSIS — Z95810 Presence of automatic (implantable) cardiac defibrillator: Secondary | ICD-10-CM | POA: Insufficient documentation

## 2015-11-17 DIAGNOSIS — Z7982 Long term (current) use of aspirin: Secondary | ICD-10-CM | POA: Diagnosis not present

## 2015-11-17 DIAGNOSIS — I209 Angina pectoris, unspecified: Secondary | ICD-10-CM | POA: Insufficient documentation

## 2015-11-17 DIAGNOSIS — I214 Non-ST elevation (NSTEMI) myocardial infarction: Secondary | ICD-10-CM | POA: Insufficient documentation

## 2015-11-17 DIAGNOSIS — M109 Gout, unspecified: Secondary | ICD-10-CM | POA: Insufficient documentation

## 2015-11-17 NOTE — Progress Notes (Signed)
Daily Session Note  Patient Details  Name: [name] MRN: [id number] Date of Birth: November 04, 1940 Referring Provider:   Flowsheet Row CARDIAC REHAB PHASE II EXERCISE from 09/24/2015 in [place]  Referring Provider  Dr. Aundra Dubin      Encounter Date: 11/17/2015  Check In:     Session Check In - 11/17/15 1100      Check-In   Location [facility]   Staff Present Diane Angelina Pih, MS, EP, CHC, Exercise Physiologist;Gregory Luther Parody, BS, EP, Exercise Physiologist;Beena Catano Wynetta Emery, RN, BSN   Supervising physician immediately available to respond to emergencies See telemetry face sheet for immediately available MD   Medication changes reported     No   Fall or balance concerns reported    No   Warm-up and Cool-down Performed as group-led instruction   Resistance Training Performed Yes   VAD Patient? No     VAD patient   Has back up controller? No     Pain Assessment   Currently in Pain? No/denies   Pain Score 0-No pain   Multiple Pain Sites No      Capillary Blood Glucose: No results found for this or any previous visit (from the past 24 hour(s)).   Goals Met:  Independence with exercise equipment Exercise tolerated well No report of cardiac concerns or symptoms Strength training completed today  Goals Unmet:  Not Applicable  Comments: check out 1100   Dr. Kate Sable is Medical Director for [place] and Pulmonary Rehab.

## 2015-11-19 ENCOUNTER — Encounter (HOSPITAL_COMMUNITY)
Admission: RE | Admit: 2015-11-19 | Discharge: 2015-11-19 | Disposition: A | Discharge status: Home / Self Care | Payer: Managed Care, Other (non HMO) | Source: Ambulatory Visit | Attending: Cardiology | Admitting: Cardiology

## 2015-11-19 DIAGNOSIS — Z955 Presence of coronary angioplasty implant and graft: Secondary | ICD-10-CM | POA: Diagnosis not present

## 2015-11-19 DIAGNOSIS — I214 Non-ST elevation (NSTEMI) myocardial infarction: Secondary | ICD-10-CM

## 2015-11-19 NOTE — Progress Notes (Signed)
Daily Session Note  Patient Details  Name: [name] MRN: [id number] Date of Birth: 05-16-1940 Referring Provider:   Flowsheet Row CARDIAC REHAB PHASE II EXERCISE from 09/24/2015 in [place]  Referring Provider  Dr. Aundra Dubin      Encounter Date: 11/19/2015  Check In:     Session Check In - 11/19/15 1100      Check-In   Location [facility]   Staff Present Diane Angelina Pih, MS, EP, [facility], Exercise Physiologist;Inayah Woodin Wynetta Emery, RN, BSN   Supervising physician immediately available to respond to emergencies See telemetry face sheet for immediately available MD   Medication changes reported     No   Fall or balance concerns reported    No   Warm-up and Cool-down Performed as group-led instruction   Resistance Training Performed Yes   VAD Patient? No     VAD patient   Has back up controller? No     Pain Assessment   Currently in Pain? No/denies   Pain Score 0-No pain   Multiple Pain Sites No      Capillary Blood Glucose: No results found for this or any previous visit (from the past 24 hour(s)).   Goals Met:  Independence with exercise equipment Exercise tolerated well No report of cardiac concerns or symptoms Strength training completed today  Goals Unmet:  Not Applicable  Comments: Check out 1200.   Dr. Kate Sable is Medical Director for [facility] Cardiac and Pulmonary Rehab.

## 2015-11-22 ENCOUNTER — Encounter (HOSPITAL_COMMUNITY)
Admission: RE | Admit: 2015-11-22 | Discharge: 2015-11-22 | Disposition: A | Discharge status: Home / Self Care | Payer: Managed Care, Other (non HMO) | Source: Ambulatory Visit | Attending: Cardiology | Admitting: Cardiology

## 2015-11-22 ENCOUNTER — Other Ambulatory Visit (HOSPITAL_COMMUNITY): Payer: Self-pay | Admitting: *Deleted

## 2015-11-22 DIAGNOSIS — Z955 Presence of coronary angioplasty implant and graft: Secondary | ICD-10-CM

## 2015-11-22 DIAGNOSIS — I214 Non-ST elevation (NSTEMI) myocardial infarction: Secondary | ICD-10-CM

## 2015-11-22 MED ORDER — CARVEDILOL 12.5 MG PO TABS: 18.7500 mg | ORAL_TABLET | Freq: Two times a day (BID) | ORAL | 3 refills | Status: DC

## 2015-11-22 NOTE — Progress Notes (Signed)
Daily Session Note  Patient Details  Name: [name] MRN: [id number] Date of Birth: 1941-04-10 Referring Provider:   Flowsheet Row CARDIAC REHAB PHASE II EXERCISE from 09/24/2015 in [place]  Referring Provider  Dr. Aundra Dubin      Encounter Date: 11/22/2015  Check In:     Session Check In - 11/22/15 1100      Check-In   Location [facility]   Staff Present Diane Angelina Pih, MS, EP, [facility], Exercise Physiologist;Debra Wynetta Emery, RN, BSN;Falon Huesca, BS, EP, Exercise Physiologist   Supervising physician immediately available to respond to emergencies See telemetry face sheet for immediately available MD   Medication changes reported     No   Fall or balance concerns reported    No   Warm-up and Cool-down Performed as group-led instruction   Resistance Training Performed Yes   VAD Patient? No     VAD patient   Has back up controller? No     Pain Assessment   Currently in Pain? No/denies   Pain Score 0-No pain   Multiple Pain Sites No      Capillary Blood Glucose: No results found for this or any previous visit (from the past 24 hour(s)).   Goals Met:  Independence with exercise equipment Exercise tolerated well No report of cardiac concerns or symptoms Strength training completed today  Goals Unmet:  Not Applicable  Comments: Check out 1200   Dr. Kate Sable is Medical Director for [place] and Pulmonary Rehab.

## 2015-11-24 ENCOUNTER — Encounter (HOSPITAL_COMMUNITY)
Admission: RE | Admit: 2015-11-24 | Discharge: 2015-11-24 | Disposition: A | Discharge status: Home / Self Care | Payer: Managed Care, Other (non HMO) | Source: Ambulatory Visit | Attending: Cardiology | Admitting: Cardiology

## 2015-11-24 DIAGNOSIS — Z955 Presence of coronary angioplasty implant and graft: Secondary | ICD-10-CM | POA: Diagnosis not present

## 2015-11-24 DIAGNOSIS — I214 Non-ST elevation (NSTEMI) myocardial infarction: Secondary | ICD-10-CM

## 2015-11-24 NOTE — Progress Notes (Signed)
Daily Session Note  Patient Details  Name: [name] MRN: [id number] Date of Birth: 06/06/1940 Referring Provider:   Flowsheet Row CARDIAC REHAB PHASE II EXERCISE from 09/24/2015 in [place]  Referring Provider  Dr. Aundra Dubin      Encounter Date: 11/24/2015  Check In:     Session Check In - 11/24/15 1100      Check-In   Location [facility]   Staff Present Diane Angelina Pih, MS, EP, [facility], Exercise Physiologist;Gregory Luther Parody, BS, EP, Exercise Physiologist;Collen Vincent Wynetta Emery, RN, BSN   Supervising physician immediately available to respond to emergencies See telemetry face sheet for immediately available MD   Medication changes reported     No   Fall or balance concerns reported    No   Warm-up and Cool-down Performed as group-led instruction   Resistance Training Performed Yes   VAD Patient? No     VAD patient   Has back up controller? No     Pain Assessment   Currently in Pain? No/denies   Pain Score 0-No pain   Multiple Pain Sites No      Capillary Blood Glucose: No results found for this or any previous visit (from the past 24 hour(s)).   Goals Met:  Independence with exercise equipment Exercise tolerated well No report of cardiac concerns or symptoms Strength training completed today  Goals Unmet:  Not Applicable  Comments: Check out 1200.   Dr. Kate Sable is Medical Director for [facility] Cardiac and Pulmonary Rehab.

## 2015-11-26 ENCOUNTER — Encounter (HOSPITAL_COMMUNITY): Payer: Managed Care, Other (non HMO)

## 2015-11-29 ENCOUNTER — Encounter (HOSPITAL_COMMUNITY): Payer: Managed Care, Other (non HMO)

## 2015-12-01 ENCOUNTER — Encounter (HOSPITAL_COMMUNITY): Payer: Managed Care, Other (non HMO)

## 2015-12-01 NOTE — Progress Notes (Signed)
Cardiac Individual Treatment Plan  Patient Details  Name: [name] MRN: [id number] Date of Birth: 21-Feb-1941 Referring Provider:   Flowsheet Row CARDIAC REHAB PHASE II EXERCISE from 09/24/2015 in [place]  Referring Provider  Dr. Aundra Dubin      Initial Encounter Date:  Flowsheet Row CARDIAC REHAB PHASE II EXERCISE from 09/24/2015 in [place]  Date  09/24/15  Referring Provider  Dr. Aundra Dubin      Visit Diagnosis: NSTEMI (non-ST elevated myocardial infarction) [facility])  Patient's Home Medications on Admission:  Current Outpatient Prescriptions:  .  allopurinol (ZYLOPRIM) 100 MG tablet, Take 100 mg by mouth 2 (two) times daily. , Disp: , Rfl:  .  aspirin EC 81 MG tablet, Take 81 mg by mouth daily., Disp: , Rfl:  .  atorvastatin (LIPITOR) 80 MG tablet, Take 1 tablet (80 mg total) by mouth daily at 6 PM., Disp: 30 tablet, Rfl: 5 .  carvedilol (COREG) 12.5 MG tablet, Take 1.5 tablets (18.75 mg total) by mouth 2 (two) times daily with a meal., Disp: 270 tablet, Rfl: 3 .  clopidogrel (PLAVIX) 75 MG tablet, Take 1 tablet (75 mg total) by mouth daily., Disp: 90 tablet, Rfl: 3 .  colchicine 0.6 MG tablet, Take 1 tablet (0.6 mg total) by mouth daily as needed (for gout flare)., Disp: 30 tablet, Rfl: 6 .  eplerenone (INSPRA) 25 MG tablet, Take 1 tablet (25 mg total) by mouth every evening., Disp: 30 tablet, Rfl: 6 .  furosemide (LASIX) 40 MG tablet, Take 40 mg by mouth 2 (two) times daily., Disp: , Rfl:  .  Insulin Lispro Prot & Lispro (HUMALOG MIX 50/50 KWIKPEN) (50-50) 100 UNIT/ML Kwikpen, 15 units at breakfast and lunch, 30 units before supper.  Take insulin 10 minutes before eating, Disp: 15 mL, Rfl: 0 .  Insulin Pen Needle (RELION PEN NEEDLES) 32G X 4 MM MISC, Use to inject insulin 3 times per day., Disp: 150 each, Rfl: 2 .  isosorbide mononitrate (IMDUR) 30 MG 24 hr tablet, Take 1 tablet (30 mg total) by mouth at bedtime., Disp: 30 tablet, Rfl:  5 .  lisinopril (PRINIVIL,ZESTRIL) 5 MG tablet, Take 1 tablet (5 mg total) by mouth daily., Disp: 30 tablet, Rfl: 3 .  metFORMIN (GLUCOPHAGE) 1000 MG tablet, Take 1 tablet (1,000 mg total) by mouth daily with supper., Disp: 30 tablet, Rfl: 3 .  nitroGLYCERIN (NITROSTAT) 0.4 MG SL tablet, Place 1 tablet (0.4 mg total) under the tongue every 5 (five) minutes x 3 doses as needed. For chest pain. (Patient not taking: Reported on 10/26/2015), Disp: 25 tablet, Rfl: 3 .  ONE TOUCH ULTRA TEST test strip, Use to check blood sugar 3 times per day., Disp: 150 each, Rfl: 2 .  ONETOUCH DELICA LANCETS [id number] MISC, , Disp: , Rfl:  .  pantoprazole (PROTONIX) 40 MG tablet, Take 1 tablet (40 mg total) by mouth daily., Disp: 30 tablet, Rfl: 11 .  potassium chloride SA (K-DUR,KLOR-CON) 20 MEQ tablet, Take 1 tablet (20 mEq total) by mouth daily., Disp: 30 tablet, Rfl: 5 .  ranolazine (RANEXA) 1000 MG SR tablet, Take 500 mg by mouth 2 (two) times daily., Disp: , Rfl:  .  venlafaxine (EFFEXOR-XR) 150 MG 24 hr capsule, Take 150 mg by mouth at bedtime. , Disp: , Rfl:  .  zolpidem (AMBIEN CR) 12.5 MG CR tablet, Take 12.5 mg by mouth at bedtime as needed. For sleep, Disp: , Rfl:   Past Medical History: Past Medical History:  Diagnosis Date  . Angina   . CAD (coronary artery disease)    s/p CABG 1997, PCI (BMS) of SVG to RCA 3/09, redo CABG 02/2011 with SVG to PDA, SVG to Lcx, s/p cath 2.2013 with ocluded SVT to left circ and patent SVG to RCA.  Cath 07/2013 Recent cath showed ischemic cardiomyopathy with LVEF less than 20%, with progressive LV dysfunction related to bypass graft failure, occlusion of the saphenous vein grafts placed in 2012 to the right coronary and to the circum  . Cardiac defibrillator in situ   . CHF (congestive heart failure) ([place])    EF 30% by cath 05/2011  . Chronic systolic dysfunction of left ventricle    EF 30%  . Complication of anesthesia    ONCE WITH BACK SURGERY DIFFICULT TO WAKE UP  .  Depression   . DJD (degenerative joint disease)   . DM (diabetes mellitus) ([place]) 02/14/2011  . GERD (gastroesophageal reflux disease)   . Gout   . Heart attack ([place])   . HTN (hypertension) 02/14/2011  . Hyperlipemia   . ICD (implantable cardiac defibrillator) in place   . Ischemic cardiomyopathy    s/p ICD Implantation by Dr Leonia Reeves  . Myocardial infarction ([place]) 1997  . Paroxysmal atrial fibrillation (HCC) 10/22/14   single episode of AF x 3 hours 48 minutes recorded on ICD, chads2vasc score is at least 5     Tobacco Use: History  Smoking Status  . Former Smoker  . Packs/day: 0.50  . Years: 15.00  . Types: Cigarettes  . Quit date: 04/17/1969  Smokeless Tobacco  . Former Systems developer  . Quit date: 05/05/1971    Labs: Recent Review Flowsheet Data    Labs for ITP Cardiac and Pulmonary Rehab Latest Ref Rng & Units 02/17/2014 02/17/2014 06/29/2014 02/17/2015 10/04/2015   Cholestrol 0 - 200 mg/dL - - 116 147 -   LDLCALC 0 - 99 mg/dL - - 46 80 -   HDL >40 mg/dL - - 35.50(L) 32(L) -   Trlycerides <150 mg/dL - - 174.0(H) 176(H) -   Hemoglobin A1c 4.6 - 6.5 % - - - - 9.2(H)   PHART 7.350 - 7.450 - 7.412 - - -   PCO2ART 35.0 - 45.0 mmHg - 40.2 - - -   HCO3 20.0 - 24.0 mEq/L 26.3(H) 25.6(H) - - -   TCO2 0 - 100 mmol/L 28 27 - - -   ACIDBASEDEF 0.0 - 2.0 mmol/L - - - - -   O2SAT % 71.0 98.0 - - -      Capillary Blood Glucose: Lab Results  Component Value Date   GLUCAP 242 (H) 08/08/2015   GLUCAP 148 (H) 08/08/2015   GLUCAP 241 (H) 08/07/2015   GLUCAP 238 (H) 08/07/2015   GLUCAP 205 (H) 08/07/2015     Exercise Target Goals:    Exercise Program Goal: Individual exercise prescription set with THRR, safety & activity barriers. Participant demonstrates ability to understand and report RPE using BORG scale, to self-measure pulse accurately, and to acknowledge the importance of the exercise prescription.  Exercise Prescription Goal: Starting with aerobic activity 30 plus minutes a day, 3  days per week for initial exercise prescription. Provide home exercise prescription and guidelines that participant acknowledges understanding prior to discharge.  Activity Barriers & Risk Stratification:     Activity Barriers & Cardiac Risk Stratification - 09/24/15 1539      Activity Barriers & Cardiac Risk Stratification   Activity Barriers None   Cardiac Risk  Stratification High      6 Minute Walk:     6 Minute Walk    Row Name 09/24/15 1449         6 Minute Walk   Phase Initial     Distance 1025 feet     Walk Time 6 minutes     # of Rest Breaks 0     MPH 1.94     METS 2.48     RPE 11     Perceived Dyspnea  11     VO2 Peak 8.19     Symptoms No     Resting HR 89 bpm     Resting BP 120/60     Max Ex. HR 114 bpm     Max Ex. BP 156/66     2 Minute Post BP 124/64        Initial Exercise Prescription:     Initial Exercise Prescription - 09/24/15 1400      Date of Initial Exercise RX and Referring Provider   Date 09/24/15   Referring Provider Dr. Aundra Dubin     Treadmill   MPH 1.9   Grade 0   Minutes 15   METs 1.9     NuStep   Level 2   Watts 22   Minutes 15   METs 1.9     Arm Ergometer   Level 2   Watts 23   Minutes 15   METs 2     Prescription Details   Frequency (times per week) 3   Duration Progress to 30 minutes of continuous aerobic without signs/symptoms of physical distress     Intensity   THRR REST +  30   THRR 40-80% of Max Heartrate [phone]   Ratings of Perceived Exertion 11-13   Perceived Dyspnea 0-4     Progression   Progression Continue to progress workloads to maintain intensity without signs/symptoms of physical distress.     Resistance Training   Training Prescription Yes   Weight 1   Reps 10-12      Perform Capillary Blood Glucose checks as needed.  Exercise Prescription Changes:      Exercise Prescription Changes    Row Name 10/11/15 1800 10/13/15 1400 10/18/15 1200 11/03/15 1200 11/24/15 1200     Exercise  Review   Progression  - Yes Yes Yes Yes     Response to Exercise   Blood Pressure (Admit) 120/62 130/60 118/60 96/50 132/68   Blood Pressure (Exercise) 128/68 136/60 110/70 110/62 126/80   Blood Pressure (Exit) 110/60 126/60 120/60 100/56 140/80   Heart Rate (Admit) 90 bpm 84 bpm 84 bpm 86 bpm 98 bpm   Heart Rate (Exercise) 99 bpm 101 bpm 103 bpm 95 bpm 106 bpm   Heart Rate (Exit) 88 bpm 92 bpm 93 bpm 86 bpm 99 bpm   Rating of Perceived Exertion (Exercise) 11 11 11 10 11    Duration Progress to 30 minutes of continuous aerobic without signs/symptoms of physical distress Progress to 30 minutes of continuous aerobic without signs/symptoms of physical distress Progress to 30 minutes of continuous aerobic without signs/symptoms of physical distress Progress to 30 minutes of continuous aerobic without signs/symptoms of physical distress Progress to 30 minutes of continuous aerobic without signs/symptoms of physical distress   Intensity Rest + 30 Rest + 30 Rest + 30 Rest + 30 Rest + 30     Progression   Progression Continue to progress workloads to maintain intensity without signs/symptoms of physical distress. Continue  to progress workloads to maintain intensity without signs/symptoms of physical distress. Continue to progress workloads to maintain intensity without signs/symptoms of physical distress. Continue to progress workloads to maintain intensity without signs/symptoms of physical distress. Continue to progress workloads to maintain intensity without signs/symptoms of physical distress.     Resistance Training   Training Prescription Yes Yes Yes Yes Yes   Weight 2 2 2 2 3    Reps 10-12 10-12 10-12 10-12 10-12     Treadmill   MPH 1.4 1.5 1.5 1.6 1.9   Grade 0 0.5 0 0 0   Minutes 15 15 15 15 15    METs 2 2.2 2.1 2.2 2.4     NuStep   Level 2 2 2 3 3    Watts 14 14 12 22 14    Minutes 20 20 20 20 20    METs 3.62 3.62 3.62 3.62 3.63     Home Exercise Plan   Plans to continue exercise at  [place]   Frequency Add 2 additional days to program exercise sessions. Add 2 additional days to program exercise sessions. Add 2 additional days to program exercise sessions. Add 2 additional days to program exercise sessions. Add 2 additional days to program exercise sessions.      Exercise Comments:      Exercise Comments    Row Name 11/10/15 [id number] 11/24/15 1242         Exercise Comments Patient is proggressing appropriately  Patient is progessing appropriately           Discharge Exercise Prescription (Final Exercise Prescription Changes):     Exercise Prescription Changes - 11/24/15 1200      Exercise Review   Progression Yes     Response to Exercise   Blood Pressure (Admit) 132/68   Blood Pressure (Exercise) 126/80   Blood Pressure (Exit) 140/80   Heart Rate (Admit) 98 bpm   Heart Rate (Exercise) 106 bpm   Heart Rate (Exit) 99 bpm   Rating of Perceived Exertion (Exercise) 11   Duration Progress to 30 minutes of continuous aerobic without signs/symptoms of physical distress   Intensity Rest + 30     Progression   Progression Continue to progress workloads to maintain intensity without signs/symptoms of physical distress.     Resistance Training   Training Prescription Yes   Weight 3   Reps 10-12     Treadmill   MPH 1.9   Grade 0   Minutes 15   METs 2.4     NuStep   Level 3   Watts 14   Minutes 20   METs 3.63     Home Exercise Plan   Plans to continue exercise at Home   Frequency Add 2 additional days to program exercise sessions.      Nutrition:  Target Goals: Understanding of nutrition guidelines, daily intake of sodium 1500mg , cholesterol 200mg , calories 30% from fat and 7% or less from saturated fats, daily to have 5 or more servings of fruits and vegetables.  Biometrics:     Pre Biometrics - 09/24/15 1452      Pre Biometrics   Height 5\' 7"  (1.702 m)   Weight 205 lb 0.4 oz (93 kg)   Waist Circumference 43 inches    Hip Circumference 43 inches   Waist to Hip Ratio 1 %   BMI (Calculated) 32.2   Triceps Skinfold 15 mm   % Body Fat 30.9 %   Grip Strength 70 kg  Flexibility 13.7 in   Single Leg Stand 3 seconds       Nutrition Therapy Plan and Nutrition Goals:   Nutrition Discharge: Rate Your Plate Scores:     Nutrition Assessments - 09/24/15 1546      MEDFICTS Scores   Pre Score 27      Nutrition Goals Re-Evaluation:   Psychosocial: Target Goals: Acknowledge presence or absence of depression, maximize coping skills, provide positive support system. Participant is able to verbalize types and ability to use techniques and skills needed for reducing stress and depression.  Initial Review & Psychosocial Screening:     Initial Psych Review & Screening - 09/24/15 1649      Initial Review   Current issues with --  No abuse or negelect issues.      Family Dynamics   Good Support System? Yes     Barriers   Psychosocial barriers to participate in program There are no identifiable barriers or psychosocial needs.     Screening Interventions   Interventions Encouraged to exercise      Quality of Life Scores:     Quality of Life - 09/24/15 1454      Quality of Life Scores   Health/Function Pre 24.17 %   Socioeconomic Pre 25.5 %   Psych/Spiritual Pre 24.14 %   Family Pre 27.75 %   GLOBAL Pre 24.86 %      PHQ-9: Recent Review Flowsheet Data    Depression screen [facility] 2/9 09/24/2015   Decreased Interest 0   Down, Depressed, Hopeless 1   PHQ - 2 Score 1   Altered sleeping 2   Tired, decreased energy 2   Change in appetite 3   Feeling bad or failure about yourself  1   Trouble concentrating 0   Moving slowly or fidgety/restless 1   Suicidal thoughts 0   PHQ-9 Score 10   Difficult doing work/chores Somewhat difficult      Psychosocial Evaluation and Intervention:     Psychosocial Evaluation - 09/24/15 1650      Psychosocial Evaluation & Interventions   Interventions  Encouraged to exercise with the program and follow exercise prescription;Stress management education;Relaxation education   Comments Patient is already seeing an counselor for his depression   Continued Psychosocial Services Needed Yes      Psychosocial Re-Evaluation:     Psychosocial Re-Evaluation    Miranda Name 10/11/15 1850 11/11/15 0813 12/01/15 1447         Psychosocial Re-Evaluation   Interventions Encouraged to attend Cardiac Rehabilitation for the exercise  -  -     Comments  - Patients does have some psychosocial evidenced by his goal of being able to be around other people and his PHQ-9 score was 10. His QOL score was 24.86. He is seeing a Social worker.  Patient's QOL score was 24.86. He has no psychosocial issues and does not need counseling.      Continued Psychosocial Services Needed No Yes  Patient is seeing a Social worker.  No        Vocational Rehabilitation: Provide vocational rehab assistance to qualifying candidates.   Vocational Rehab Evaluation & Intervention:     Vocational Rehab - 09/24/15 1541      Initial Vocational Rehab Evaluation & Intervention   Assessment shows need for Vocational Rehabilitation No      Education: Education Goals: Education classes will be provided on a weekly basis, covering required topics. Participant will state understanding/return demonstration of topics presented.  Learning Barriers/Preferences:  Learning Barriers/Preferences - 09/24/15 1539      Learning Barriers/Preferences   Learning Barriers None   Learning Preferences Skilled Demonstration      Education Topics: Hypertension, Hypertension Reduction -Define heart disease and high blood pressure. Discus how high blood pressure affects the body and ways to reduce high blood pressure. Flowsheet Row CARDIAC REHAB PHASE II EXERCISE from 11/24/2015 in [place]  Date  11/17/15  Educator  DC  Instruction Review Code  2- meets goals/outcomes       Exercise and Your Heart -Discuss why it is important to exercise, the FITT principles of exercise, normal and abnormal responses to exercise, and how to exercise safely. Flowsheet Row CARDIAC REHAB PHASE II EXERCISE from 11/24/2015 in [place]  Date  11/24/15  Educator  DC  Instruction Review Code  2- meets goals/outcomes      Angina -Discuss definition of angina, causes of angina, treatment of angina, and how to decrease risk of having angina.   Cardiac Medications -Review what the following cardiac medications are used for, how they affect the body, and side effects that may occur when taking the medications.  Medications include Aspirin, Beta blockers, calcium channel blockers, ACE Inhibitors, angiotensin receptor blockers, diuretics, digoxin, and antihyperlipidemics.   Congestive Heart Failure -Discuss the definition of CHF, how to live with CHF, the signs and symptoms of CHF, and how keep track of weight and sodium intake.   Heart Disease and Intimacy -Discus the effect sexual activity has on the heart, how changes occur during intimacy as we age, and safety during sexual activity.   Smoking Cessation / COPD -Discuss different methods to quit smoking, the health benefits of quitting smoking, and the definition of COPD. Flowsheet Row CARDIAC REHAB PHASE II EXERCISE from 11/24/2015 in [place]  Date  09/29/15  Educator  Russella Dar  Instruction Review Code  2- meets goals/outcomes      Nutrition I: Fats -Discuss the types of cholesterol, what cholesterol does to the heart, and how cholesterol levels can be controlled. Flowsheet Row CARDIAC REHAB PHASE II EXERCISE from 11/24/2015 in [place]  Date  10/06/15  Educator  Russella Dar  Instruction Review Code  2- meets goals/outcomes      Nutrition II: Labels -Discuss the different components of food labels and how to read food label [place] from 11/24/2015 in [place]  Date  10/13/15  Educator  Russella Dar  Instruction Review Code  2- meets goals/outcomes      Heart Parts and Heart Disease -Discuss the anatomy of the heart, the pathway of blood circulation through the heart, and these are affected by heart disease.   Stress I: Signs and Symptoms -Discuss the causes of stress, how stress may lead to anxiety and depression, and ways to limit stress.   Stress II: Relaxation -Discuss different types of relaxation techniques to limit stress. Flowsheet Row CARDIAC REHAB PHASE II EXERCISE from 11/24/2015 in [place]  Date  11/03/15  Educator  DJ  Instruction Review Code  2- meets goals/outcomes      Warning Signs of Stroke / TIA -Discuss definition of a stroke, what the signs and symptoms are of a stroke, and how to identify when someone is having stroke. Flowsheet Row CARDIAC REHAB PHASE II EXERCISE from 11/24/2015 in [place]  Date  11/10/15  Educator  DJ/DC  Instruction Review Code  2- meets goals/outcomes      Knowledge Questionnaire Score:     Knowledge Questionnaire Score - 09/24/15 1540      Knowledge Questionnaire Score   Pre Score 21/24      Core Components/Risk Factors/Patient Goals at Admission:     Personal Goals and Risk Factors at Admission - 09/24/15 1546      Core Components/Risk Factors/Patient Goals on Admission    Weight Management Weight Maintenance   Increase Strength and Stamina Yes   Intervention Provide advice, education, support and counseling about physical activity/exercise needs.;Develop an individualized exercise prescription for aerobic and resistive training based on initial evaluation findings, risk stratification, comorbidities and participant's personal goals.   Diabetes Yes   Intervention Provide education about signs/symptoms and action to take for hypo/hyperglycemia.    Expected Outcomes Long Term: Attainment of HbA1C < 7%.;Short Term: Participant verbalizes understanding of the signs/symptoms and immediate care of hyper/hypoglycemia, proper foot care and importance of medication, aerobic/resistive exercise and nutrition plan for blood glucose control.   Personal Goal Other Yes   Personal Goal Get stronger, be around the people in the program.    Intervention Exercise 3 times week in program and supplement 2 more days at home.    Expected Outcomes Get stonger and gain new friends.       Core Components/Risk Factors/Patient Goals Review:      Goals and Risk Factor Review    Row Name 10/11/15 1848 11/11/15 0809 12/01/15 1444         Core Components/Risk Factors/Patient Goals Review   Personal Goals Review Increase Strength and Stamina  Wants to be around people. Increase Strength and Stamina;Other  Be around other people.  Weight Management/Obesity;Increase Strength and Stamina     Review Patient says he is getting stronger, he aslo enjoys being around his classmates.  Patient feels like the program is helping him be comfortable being around other people. He is social with other patients and staff. His strength and stamina is increasing.  Patient has lost 1.2 lbs in 22 sessions. He is meeting his goal of increasing his strenght and stamina.      Expected Outcomes Get stronger, continue to enjoy the fellowship Continue to increase strength and stamina and have increased comfort being around other people.  Patient will continue to meet the above stated goals.        Core Components/Risk Factors/Patient Goals at Discharge (Final Review):      Goals and Risk Factor Review - 12/01/15 1444      Core Components/Risk Factors/Patient Goals Review   Personal Goals Review Weight Management/Obesity;Increase Strength and Stamina   Review Patient has lost 1.2 lbs in 22 sessions. He is meeting his goal of increasing his strenght and stamina.    Expected Outcomes  Patient will continue to meet the above stated goals.      ITP Comments:   Comments: Patient doing well with program. Will continue to monitor for progress.

## 2015-12-02 ENCOUNTER — Telehealth: Payer: Self-pay

## 2015-12-02 ENCOUNTER — Ambulatory Visit (INDEPENDENT_AMBULATORY_CARE_PROVIDER_SITE_OTHER): Payer: Managed Care, Other (non HMO)

## 2015-12-02 DIAGNOSIS — Z95810 Presence of automatic (implantable) cardiac defibrillator: Secondary | ICD-10-CM

## 2015-12-02 DIAGNOSIS — I5022 Chronic systolic (congestive) heart failure: Secondary | ICD-10-CM | POA: Diagnosis not present

## 2015-12-02 NOTE — Telephone Encounter (Signed)
Remote ICM transmission received.  Attempted patient call and mail box is full. 

## 2015-12-02 NOTE — Progress Notes (Signed)
EPIC Encounter for ICM Monitoring  Patient Name: [name] is a 75 y.o. male Date: 12/02/2015 Primary Care Physican: Mayra Neer, MD Primary Cardiologist: Aundra Dubin  Electrophysiologist: Allred Dry Weight: unknown Bi-V Pacing:  97.6%       Attempted patient call and unable to reach.  Transmission reviewed.   Thoracic impedance trending toward abnormal on 11/30/2015 suggesting fluid accumulation.  LABS: 10/26/2015 Creatinine 1.35, BUN 22, Potassium 4.0, Sodium 136 10/04/2015 Creatinine 1.67, BUN 28, Potassium 3.8, Sodium 137 08/24/2015 Creatinine 1.29, BUN 19, Potassium 4.2, Sodium 137 08/08/2015 Creatinine 1.15, BUN 20, Potassium 3.4, Sodium 135    Follow-up plan: ICM clinic phone appointment on 01/05/2016.  Copy of ICM check sent to device physician.   ICM trend: 12/02/2015       [name], RN 12/02/2015 8:50 AM

## 2015-12-03 ENCOUNTER — Encounter (HOSPITAL_COMMUNITY): Payer: Managed Care, Other (non HMO)

## 2015-12-06 ENCOUNTER — Ambulatory Visit: Payer: Managed Care, Other (non HMO) | Admitting: Endocrinology

## 2015-12-06 ENCOUNTER — Encounter (HOSPITAL_COMMUNITY): Payer: Managed Care, Other (non HMO)

## 2015-12-08 ENCOUNTER — Encounter (HOSPITAL_COMMUNITY)
Admission: RE | Admit: 2015-12-08 | Discharge: 2015-12-08 | Disposition: A | Discharge status: Home / Self Care | Payer: Managed Care, Other (non HMO) | Source: Ambulatory Visit | Attending: Cardiology | Admitting: Cardiology

## 2015-12-08 DIAGNOSIS — I214 Non-ST elevation (NSTEMI) myocardial infarction: Secondary | ICD-10-CM

## 2015-12-08 DIAGNOSIS — Z955 Presence of coronary angioplasty implant and graft: Secondary | ICD-10-CM | POA: Diagnosis not present

## 2015-12-08 NOTE — Progress Notes (Signed)
Daily Session Note  Patient Details  Name: [name] MRN: [id number] Date of Birth: 10-15-1940 Referring Provider:   Flowsheet Row CARDIAC REHAB PHASE II EXERCISE from 09/24/2015 in [place]  Referring Provider  Dr. Aundra Dubin      Encounter Date: 12/08/2015  Check In:     Session Check In - 12/08/15 1100      Check-In   Location [facility]   Staff Present Diane Angelina Pih, MS, EP, [facility], Exercise Physiologist;Debra Wynetta Emery, RN, BSN;Ladarion Munyon, BS, EP, Exercise Physiologist   Supervising physician immediately available to respond to emergencies See telemetry face sheet for immediately available MD   Medication changes reported     No   Fall or balance concerns reported    No   Warm-up and Cool-down Performed as group-led instruction   Resistance Training Performed Yes   VAD Patient? No     VAD patient   Has back up controller? No     Pain Assessment   Currently in Pain? No/denies   Pain Score 0-No pain   Multiple Pain Sites No      Capillary Blood Glucose: No results found for this or any previous visit (from the past 24 hour(s)).   Goals Met:  Independence with exercise equipment Exercise tolerated well No report of cardiac concerns or symptoms Strength training completed today  Goals Unmet:  Not Applicable  Comments: Check out 1200   Dr. Kate Sable is Medical Director for [place] and Pulmonary Rehab.

## 2015-12-10 ENCOUNTER — Encounter (HOSPITAL_COMMUNITY)
Admission: RE | Admit: 2015-12-10 | Discharge: 2015-12-10 | Disposition: A | Discharge status: Home / Self Care | Payer: Managed Care, Other (non HMO) | Source: Ambulatory Visit | Attending: Cardiology | Admitting: Cardiology

## 2015-12-10 DIAGNOSIS — Z955 Presence of coronary angioplasty implant and graft: Secondary | ICD-10-CM | POA: Diagnosis not present

## 2015-12-10 DIAGNOSIS — I214 Non-ST elevation (NSTEMI) myocardial infarction: Secondary | ICD-10-CM

## 2015-12-10 NOTE — Progress Notes (Signed)
Daily Session Note  Patient Details  Name: GEDEON BRANDOW MRN: [id number] Date of Birth: 1940/05/26 Referring Provider:   Flowsheet Row CARDIAC REHAB PHASE II EXERCISE from 09/24/2015 in [place]  Referring Provider  Dr. Aundra Dubin      Encounter Date: 12/10/2015  Check In:     Session Check In - 12/10/15 1100      Check-In   Location [facility]   Staff Present Diane Angelina Pih, MS, EP, [facility], Exercise Physiologist;Debra Wynetta Emery, RN, BSN;Dionte Blaustein, BS, EP, Exercise Physiologist   Supervising physician immediately available to respond to emergencies See telemetry face sheet for immediately available MD   Medication changes reported     No   Fall or balance concerns reported    No   Warm-up and Cool-down Performed as group-led instruction   Resistance Training Performed Yes   VAD Patient? No     VAD patient   Has back up controller? No     Pain Assessment   Currently in Pain? No/denies   Pain Score 0-No pain   Multiple Pain Sites No      Capillary Blood Glucose: No results found for this or any previous visit (from the past 24 hour(s)).   Goals Met:  Independence with exercise equipment Exercise tolerated well No report of cardiac concerns or symptoms Strength training completed today  Goals Unmet:  Not Applicable  Comments: Check out 1200   Dr. Kate Sable is Medical Director for [place] and Pulmonary Rehab.

## 2015-12-13 ENCOUNTER — Encounter (HOSPITAL_COMMUNITY): Payer: Managed Care, Other (non HMO)

## 2015-12-15 ENCOUNTER — Encounter (HOSPITAL_COMMUNITY): Payer: Managed Care, Other (non HMO)

## 2015-12-17 ENCOUNTER — Encounter (HOSPITAL_COMMUNITY)
Admission: RE | Admit: 2015-12-17 | Discharge: 2015-12-17 | Disposition: A | Discharge status: Home / Self Care | Payer: Managed Care, Other (non HMO) | Source: Ambulatory Visit | Attending: Cardiology | Admitting: Cardiology

## 2015-12-17 DIAGNOSIS — I480 Paroxysmal atrial fibrillation: Secondary | ICD-10-CM | POA: Insufficient documentation

## 2015-12-17 DIAGNOSIS — I509 Heart failure, unspecified: Secondary | ICD-10-CM | POA: Diagnosis not present

## 2015-12-17 DIAGNOSIS — I110 Hypertensive heart disease with heart failure: Secondary | ICD-10-CM | POA: Insufficient documentation

## 2015-12-17 DIAGNOSIS — I2510 Atherosclerotic heart disease of native coronary artery without angina pectoris: Secondary | ICD-10-CM | POA: Diagnosis not present

## 2015-12-17 DIAGNOSIS — Z79899 Other long term (current) drug therapy: Secondary | ICD-10-CM | POA: Insufficient documentation

## 2015-12-17 DIAGNOSIS — Z7982 Long term (current) use of aspirin: Secondary | ICD-10-CM | POA: Diagnosis not present

## 2015-12-17 DIAGNOSIS — Z955 Presence of coronary angioplasty implant and graft: Secondary | ICD-10-CM | POA: Diagnosis not present

## 2015-12-17 DIAGNOSIS — Z794 Long term (current) use of insulin: Secondary | ICD-10-CM | POA: Insufficient documentation

## 2015-12-17 DIAGNOSIS — E119 Type 2 diabetes mellitus without complications: Secondary | ICD-10-CM | POA: Diagnosis not present

## 2015-12-17 DIAGNOSIS — Z95810 Presence of automatic (implantable) cardiac defibrillator: Secondary | ICD-10-CM | POA: Insufficient documentation

## 2015-12-17 DIAGNOSIS — M1990 Unspecified osteoarthritis, unspecified site: Secondary | ICD-10-CM | POA: Insufficient documentation

## 2015-12-17 DIAGNOSIS — I214 Non-ST elevation (NSTEMI) myocardial infarction: Secondary | ICD-10-CM | POA: Insufficient documentation

## 2015-12-17 DIAGNOSIS — K219 Gastro-esophageal reflux disease without esophagitis: Secondary | ICD-10-CM | POA: Insufficient documentation

## 2015-12-17 DIAGNOSIS — I209 Angina pectoris, unspecified: Secondary | ICD-10-CM | POA: Diagnosis not present

## 2015-12-17 DIAGNOSIS — E785 Hyperlipidemia, unspecified: Secondary | ICD-10-CM | POA: Insufficient documentation

## 2015-12-17 DIAGNOSIS — Z87891 Personal history of nicotine dependence: Secondary | ICD-10-CM | POA: Insufficient documentation

## 2015-12-17 DIAGNOSIS — F329 Major depressive disorder, single episode, unspecified: Secondary | ICD-10-CM | POA: Diagnosis not present

## 2015-12-17 DIAGNOSIS — M109 Gout, unspecified: Secondary | ICD-10-CM | POA: Insufficient documentation

## 2015-12-17 DIAGNOSIS — I255 Ischemic cardiomyopathy: Secondary | ICD-10-CM | POA: Diagnosis not present

## 2015-12-17 NOTE — Progress Notes (Signed)
Daily Session Note  Patient Details  Name: [name] MRN: [id number] Date of Birth: 1940/09/27 Referring Provider:   Flowsheet Row CARDIAC REHAB PHASE II EXERCISE from 09/24/2015 in [place]  Referring Provider  Dr. Aundra Dubin      Encounter Date: 12/17/2015  Check In:     Session Check In - 12/17/15 1100      Check-In   Location [facility]   Staff Present Diane Angelina Pih, MS, EP, [facility], Exercise Physiologist;Debra Wynetta Emery, RN, BSN;Tien Aispuro, BS, EP, Exercise Physiologist   Supervising physician immediately available to respond to emergencies See telemetry face sheet for immediately available MD   Medication changes reported     No   Fall or balance concerns reported    No   Warm-up and Cool-down Performed as group-led instruction   Resistance Training Performed Yes   VAD Patient? No     VAD patient   Has back up controller? No     Pain Assessment   Currently in Pain? No/denies   Pain Score 0-No pain   Multiple Pain Sites No      Capillary Blood Glucose: No results found for this or any previous visit (from the past 24 hour(s)).   Goals Met:  Independence with exercise equipment Exercise tolerated well No report of cardiac concerns or symptoms Strength training completed today  Goals Unmet:  Not Applicable  Comments: Check out 1200   Dr. Kate Sable is Medical Director for [place] and Pulmonary Rehab.

## 2015-12-22 ENCOUNTER — Encounter (HOSPITAL_COMMUNITY)
Admission: RE | Admit: 2015-12-22 | Discharge: 2015-12-22 | Disposition: A | Discharge status: Home / Self Care | Payer: Managed Care, Other (non HMO) | Source: Ambulatory Visit | Attending: Cardiology | Admitting: Cardiology

## 2015-12-22 DIAGNOSIS — I214 Non-ST elevation (NSTEMI) myocardial infarction: Secondary | ICD-10-CM

## 2015-12-22 DIAGNOSIS — Z955 Presence of coronary angioplasty implant and graft: Secondary | ICD-10-CM | POA: Diagnosis not present

## 2015-12-22 NOTE — Progress Notes (Signed)
Daily Session Note  Patient Details  Name: [name] MRN: [id number] Date of Birth: December 25, 1940 Referring Provider:   Flowsheet Row CARDIAC REHAB PHASE II EXERCISE from 09/24/2015 in [place]  Referring Provider  Dr. Aundra Dubin      Encounter Date: 12/22/2015  Check In:     Session Check In - 12/22/15 1100      Check-In   Location [facility]   Staff Present Aundra Dubin, RN, BSN;Tonea Leiphart Luther Parody, BS, EP, Exercise Physiologist   Supervising physician immediately available to respond to emergencies See telemetry face sheet for immediately available MD   Medication changes reported     No   Fall or balance concerns reported    No   Warm-up and Cool-down Performed as group-led instruction   Resistance Training Performed Yes   VAD Patient? No     VAD patient   Has back up controller? No     Pain Assessment   Currently in Pain? No/denies   Pain Score 0-No pain   Multiple Pain Sites No      Capillary Blood Glucose: No results found for this or any previous visit (from the past 24 hour(s)).   Goals Met:  Independence with exercise equipment Exercise tolerated well No report of cardiac concerns or symptoms Strength training completed today  Goals Unmet:  Not Applicable  Comments: Check out 1200   Dr. Kate Sable is Medical Director for [place] and Pulmonary Rehab.

## 2015-12-24 ENCOUNTER — Encounter (HOSPITAL_COMMUNITY)
Admission: RE | Admit: 2015-12-24 | Discharge: 2015-12-24 | Disposition: A | Discharge status: Home / Self Care | Payer: Managed Care, Other (non HMO) | Source: Ambulatory Visit | Attending: Cardiology | Admitting: Cardiology

## 2015-12-24 DIAGNOSIS — Z955 Presence of coronary angioplasty implant and graft: Secondary | ICD-10-CM | POA: Diagnosis not present

## 2015-12-24 DIAGNOSIS — I214 Non-ST elevation (NSTEMI) myocardial infarction: Secondary | ICD-10-CM

## 2015-12-24 NOTE — Progress Notes (Signed)
Daily Session Note  Patient Details  Name: [name] MRN: [id number] Date of Birth: 31-Aug-1940 Referring Provider:   Flowsheet Row CARDIAC REHAB PHASE II EXERCISE from 09/24/2015 in [place]  Referring Provider  Dr. Aundra Dubin      Encounter Date: 12/24/2015  Check In:     Session Check In - 12/24/15 1100      Check-In   Location [facility]   Staff Present Aundra Dubin, RN, BSN;Lyden Redner Luther Parody, BS, EP, Exercise Physiologist   Supervising physician immediately available to respond to emergencies See telemetry face sheet for immediately available MD   Medication changes reported     No   Fall or balance concerns reported    No   Warm-up and Cool-down Performed as group-led instruction   Resistance Training Performed Yes   VAD Patient? No     VAD patient   Has back up controller? No     Pain Assessment   Currently in Pain? No/denies   Pain Score 0-No pain   Multiple Pain Sites No      Capillary Blood Glucose: No results found for this or any previous visit (from the past 24 hour(s)).   Goals Met:  Independence with exercise equipment Exercise tolerated well No report of cardiac concerns or symptoms Strength training completed today  Goals Unmet:  Not Applicable  Comments: Check out 1200   Dr. Kate Sable is Medical Director for [place] and Pulmonary Rehab.

## 2015-12-27 ENCOUNTER — Encounter (HOSPITAL_COMMUNITY)
Admission: RE | Admit: 2015-12-27 | Discharge: 2015-12-27 | Disposition: A | Discharge status: Home / Self Care | Payer: Managed Care, Other (non HMO) | Source: Ambulatory Visit | Attending: Cardiology | Admitting: Cardiology

## 2015-12-27 DIAGNOSIS — Z955 Presence of coronary angioplasty implant and graft: Secondary | ICD-10-CM | POA: Diagnosis not present

## 2015-12-27 NOTE — Progress Notes (Signed)
Daily Session Note  Patient Details  Name: [name] MRN: [id number] Date of Birth: 02/27/1941 Referring Provider:   Flowsheet Row CARDIAC REHAB PHASE II EXERCISE from 09/24/2015 in [place]  Referring Provider  Dr. Aundra Dubin      Encounter Date: 12/27/2015  Check In:     Session Check In - 12/27/15 1100      Check-In   Location [facility]   Staff Present Daril Warga Angelina Pih, MS, EP, [facility], Exercise Physiologist;Gregory Luther Parody, BS, EP, Exercise Physiologist   Supervising physician immediately available to respond to emergencies See telemetry face sheet for immediately available MD   Medication changes reported     No   Fall or balance concerns reported    No   Warm-up and Cool-down Performed as group-led instruction   Resistance Training Performed Yes   VAD Patient? No     VAD patient   Has back up controller? No     Pain Assessment   Pain Score 0-No pain   Multiple Pain Sites No      Capillary Blood Glucose: No results found for this or any previous visit (from the past 24 hour(s)).   Goals Met:  Independence with exercise equipment Exercise tolerated well No report of cardiac concerns or symptoms Strength training completed today  Goals Unmet:  Not Applicable  Comments: Check out 1200    Dr. Kate Sable is Medical Director for [place] and Pulmonary Rehab.

## 2015-12-29 ENCOUNTER — Encounter (HOSPITAL_COMMUNITY)
Admission: RE | Admit: 2015-12-29 | Discharge: 2015-12-29 | Disposition: A | Discharge status: Home / Self Care | Payer: Managed Care, Other (non HMO) | Source: Ambulatory Visit | Attending: Cardiology | Admitting: Cardiology

## 2015-12-29 DIAGNOSIS — Z955 Presence of coronary angioplasty implant and graft: Secondary | ICD-10-CM | POA: Diagnosis not present

## 2015-12-29 DIAGNOSIS — I214 Non-ST elevation (NSTEMI) myocardial infarction: Secondary | ICD-10-CM

## 2015-12-29 NOTE — Progress Notes (Signed)
Daily Session Note  Patient Details  Name: [name] MRN: [id number] Date of Birth: November 19, 1940 Referring Provider:   Flowsheet Row CARDIAC REHAB PHASE II EXERCISE from 09/24/2015 in [place]  Referring Provider  Dr. Aundra Dubin      Encounter Date: 12/29/2015  Check In:     Session Check In - 12/29/15 1100      Check-In   Location [facility]   Staff Present Diane Angelina Pih, MS, EP, [facility], Exercise Physiologist;[place]yenne Schumm Wynetta Emery, RN, BSN   Supervising physician immediately available to respond to emergencies See telemetry face sheet for immediately available MD   Medication changes reported     No   Fall or balance concerns reported    No   Warm-up and Cool-down Performed as group-led instruction   Resistance Training Performed Yes   VAD Patient? No     VAD patient   Has back up controller? No     Pain Assessment   Currently in Pain? No/denies   Pain Score 0-No pain   Multiple Pain Sites No      Capillary Blood Glucose: No results found for this or any previous visit (from the past 24 hour(s)).   Goals Met:  Independence with exercise equipment Exercise tolerated well No report of cardiac concerns or symptoms Strength training completed today  Goals Unmet:  Not Applicable  Comments: Check out 1200.   Dr. Kate Sable is Medical Director for [facility] and Pulmonary Rehab.

## 2015-12-30 ENCOUNTER — Telehealth (HOSPITAL_COMMUNITY): Payer: Self-pay | Admitting: Vascular Surgery

## 2015-12-30 NOTE — Telephone Encounter (Signed)
Returned pt wife call to make f/u appt w/ [facility]

## 2015-12-30 NOTE — Progress Notes (Signed)
Cardiac Individual Treatment Plan  Patient Details  Name: [name] MRN: [id number] Date of Birth: 09-08-1940 Referring Provider:   Flowsheet Row CARDIAC REHAB PHASE II EXERCISE from 09/24/2015 in [place]  Referring Provider  Dr. Aundra Dubin      Initial Encounter Date:  Flowsheet Row CARDIAC REHAB PHASE II EXERCISE from 09/24/2015 in [place]  Date  09/24/15  Referring Provider  Dr. Aundra Dubin      Visit Diagnosis: NSTEMI (non-ST elevated myocardial infarction) [facility])  Patient's Home Medications on Admission:  Current Outpatient Prescriptions:  .  allopurinol (ZYLOPRIM) 100 MG tablet, Take 100 mg by mouth 2 (two) times daily. , Disp: , Rfl:  .  aspirin EC 81 MG tablet, Take 81 mg by mouth daily., Disp: , Rfl:  .  atorvastatin (LIPITOR) 80 MG tablet, Take 1 tablet (80 mg total) by mouth daily at 6 PM., Disp: 30 tablet, Rfl: 5 .  carvedilol (COREG) 12.5 MG tablet, Take 1.5 tablets (18.75 mg total) by mouth 2 (two) times daily with a meal., Disp: 270 tablet, Rfl: 3 .  clopidogrel (PLAVIX) 75 MG tablet, Take 1 tablet (75 mg total) by mouth daily., Disp: 90 tablet, Rfl: 3 .  colchicine 0.6 MG tablet, Take 1 tablet (0.6 mg total) by mouth daily as needed (for gout flare)., Disp: 30 tablet, Rfl: 6 .  eplerenone (INSPRA) 25 MG tablet, Take 1 tablet (25 mg total) by mouth every evening., Disp: 30 tablet, Rfl: 6 .  furosemide (LASIX) 40 MG tablet, Take 40 mg by mouth 2 (two) times daily., Disp: , Rfl:  .  Insulin Lispro Prot & Lispro (HUMALOG MIX 50/50 KWIKPEN) (50-50) 100 UNIT/ML Kwikpen, 15 units at breakfast and lunch, 30 units before supper.  Take insulin 10 minutes before eating, Disp: 15 mL, Rfl: 0 .  Insulin Pen Needle (RELION PEN NEEDLES) 32G X 4 MM MISC, Use to inject insulin 3 times per day., Disp: 150 each, Rfl: 2 .  isosorbide mononitrate (IMDUR) 30 MG 24 hr tablet, Take 1 tablet (30 mg total) by mouth at bedtime., Disp: 30 tablet, Rfl:  5 .  lisinopril (PRINIVIL,ZESTRIL) 5 MG tablet, Take 1 tablet (5 mg total) by mouth daily., Disp: 30 tablet, Rfl: 3 .  metFORMIN (GLUCOPHAGE) 1000 MG tablet, Take 1 tablet (1,000 mg total) by mouth daily with supper., Disp: 30 tablet, Rfl: 3 .  nitroGLYCERIN (NITROSTAT) 0.4 MG SL tablet, Place 1 tablet (0.4 mg total) under the tongue every 5 (five) minutes x 3 doses as needed. For chest pain. (Patient not taking: Reported on 10/26/2015), Disp: 25 tablet, Rfl: 3 .  ONE TOUCH ULTRA TEST test strip, Use to check blood sugar 3 times per day., Disp: 150 each, Rfl: 2 .  ONETOUCH DELICA LANCETS [id number] MISC, , Disp: , Rfl:  .  pantoprazole (PROTONIX) 40 MG tablet, Take 1 tablet (40 mg total) by mouth daily., Disp: 30 tablet, Rfl: 11 .  potassium chloride SA (K-DUR,KLOR-CON) 20 MEQ tablet, Take 1 tablet (20 mEq total) by mouth daily., Disp: 30 tablet, Rfl: 5 .  ranolazine (RANEXA) 1000 MG SR tablet, Take 500 mg by mouth 2 (two) times daily., Disp: , Rfl:  .  venlafaxine (EFFEXOR-XR) 150 MG 24 hr capsule, Take 150 mg by mouth at bedtime. , Disp: , Rfl:  .  zolpidem (AMBIEN CR) 12.5 MG CR tablet, Take 12.5 mg by mouth at bedtime as needed. For sleep, Disp: , Rfl:   Past Medical History: Past Medical History:  Diagnosis Date  . Angina   . CAD (coronary artery disease)    s/p CABG 1997, PCI (BMS) of SVG to RCA 3/09, redo CABG 02/2011 with SVG to PDA, SVG to Lcx, s/p cath 2.2013 with ocluded SVT to left circ and patent SVG to RCA.  Cath 07/2013 Recent cath showed ischemic cardiomyopathy with LVEF less than 20%, with progressive LV dysfunction related to bypass graft failure, occlusion of the saphenous vein grafts placed in 2012 to the right coronary and to the circum  . Cardiac defibrillator in situ   . CHF (congestive heart failure) ([place])    EF 30% by cath 05/2011  . Chronic systolic dysfunction of left ventricle    EF 30%  . Complication of anesthesia    ONCE WITH BACK SURGERY DIFFICULT TO WAKE UP  .  Depression   . DJD (degenerative joint disease)   . DM (diabetes mellitus) ([place]) 02/14/2011  . GERD (gastroesophageal reflux disease)   . Gout   . Heart attack ([place])   . HTN (hypertension) 02/14/2011  . Hyperlipemia   . ICD (implantable cardiac defibrillator) in place   . Ischemic cardiomyopathy    s/p ICD Implantation by Dr Leonia Reeves  . Myocardial infarction ([place]) 1997  . Paroxysmal atrial fibrillation ([facility]) 10/22/14   single episode of AF x 3 hours 48 minutes recorded on ICD, chads2vasc score is at least 5     Tobacco Use: History  Smoking Status  . Former Smoker  . Packs/day: 0.50  . Years: 15.00  . Types: Cigarettes  . Quit date: 04/17/1969  Smokeless Tobacco  . Former Systems developer  . Quit date: 05/05/1971    Labs: Recent Review Flowsheet Data    Labs for ITP Cardiac and Pulmonary Rehab Latest Ref Rng & Units 02/17/2014 02/17/2014 06/29/2014 02/17/2015 10/04/2015   Cholestrol 0 - 200 mg/dL - - 116 147 -   LDLCALC 0 - 99 mg/dL - - 46 80 -   HDL >40 mg/dL - - 35.50(L) 32(L) -   Trlycerides <150 mg/dL - - 174.0(H) 176(H) -   Hemoglobin A1c 4.6 - 6.5 % - - - - 9.2(H)   PHART 7.350 - 7.450 - 7.412 - - -   PCO2ART 35.0 - 45.0 mmHg - 40.2 - - -   HCO3 20.0 - 24.0 mEq/L 26.3(H) 25.6(H) - - -   TCO2 0 - 100 mmol/L 28 27 - - -   ACIDBASEDEF 0.0 - 2.0 mmol/L - - - - -   O2SAT % 71.0 98.0 - - -      Capillary Blood Glucose: Lab Results  Component Value Date   GLUCAP 242 (H) 08/08/2015   GLUCAP 148 (H) 08/08/2015   GLUCAP 241 (H) 08/07/2015   GLUCAP 238 (H) 08/07/2015   GLUCAP 205 (H) 08/07/2015     Exercise Target Goals:    Exercise Program Goal: Individual exercise prescription set with THRR, safety & activity barriers. Participant demonstrates ability to understand and report RPE using BORG scale, to self-measure pulse accurately, and to acknowledge the importance of the exercise prescription.  Exercise Prescription Goal: Starting with aerobic activity 30 plus minutes a day, 3  days per week for initial exercise prescription. Provide home exercise prescription and guidelines that participant acknowledges understanding prior to discharge.  Activity Barriers & Risk Stratification:     Activity Barriers & Cardiac Risk Stratification - 09/24/15 1539      Activity Barriers & Cardiac Risk Stratification   Activity Barriers None   Cardiac Risk  Stratification High      6 Minute Walk:     6 Minute Walk    Row Name 09/24/15 1449         6 Minute Walk   Phase Initial     Distance 1025 feet     Walk Time 6 minutes     # of Rest Breaks 0     MPH 1.94     METS 2.48     RPE 11     Perceived Dyspnea  11     VO2 Peak 8.19     Symptoms No     Resting HR 89 bpm     Resting BP 120/60     Max Ex. HR 114 bpm     Max Ex. BP 156/66     2 Minute Post BP 124/64        Initial Exercise Prescription:     Initial Exercise Prescription - 09/24/15 1400      Date of Initial Exercise RX and Referring Provider   Date 09/24/15   Referring Provider Dr. Aundra Dubin     Treadmill   MPH 1.9   Grade 0   Minutes 15   METs 1.9     NuStep   Level 2   Watts 22   Minutes 15   METs 1.9     Arm Ergometer   Level 2   Watts 23   Minutes 15   METs 2     Prescription Details   Frequency (times per week) 3   Duration Progress to 30 minutes of continuous aerobic without signs/symptoms of physical distress     Intensity   THRR REST +  30   THRR 40-80% of Max Heartrate [phone]   Ratings of Perceived Exertion 11-13   Perceived Dyspnea 0-4     Progression   Progression Continue to progress workloads to maintain intensity without signs/symptoms of physical distress.     Resistance Training   Training Prescription Yes   Weight 1   Reps 10-12      Perform Capillary Blood Glucose checks as needed.  Exercise Prescription Changes:      Exercise Prescription Changes    Row Name 10/11/15 1800 10/13/15 1400 10/18/15 1200 11/03/15 1200 11/24/15 1200     Exercise  Review   Progression  - Yes Yes Yes Yes     Response to Exercise   Blood Pressure (Admit) 120/62 130/60 118/60 96/50 132/68   Blood Pressure (Exercise) 128/68 136/60 110/70 110/62 126/80   Blood Pressure (Exit) 110/60 126/60 120/60 100/56 140/80   Heart Rate (Admit) 90 bpm 84 bpm 84 bpm 86 bpm 98 bpm   Heart Rate (Exercise) 99 bpm 101 bpm 103 bpm 95 bpm 106 bpm   Heart Rate (Exit) 88 bpm 92 bpm 93 bpm 86 bpm 99 bpm   Rating of Perceived Exertion (Exercise) 11 11 11 10 11    Duration Progress to 30 minutes of continuous aerobic without signs/symptoms of physical distress Progress to 30 minutes of continuous aerobic without signs/symptoms of physical distress Progress to 30 minutes of continuous aerobic without signs/symptoms of physical distress Progress to 30 minutes of continuous aerobic without signs/symptoms of physical distress Progress to 30 minutes of continuous aerobic without signs/symptoms of physical distress   Intensity Rest + 30 Rest + 30 Rest + 30 Rest + 30 Rest + 30     Progression   Progression Continue to progress workloads to maintain intensity without signs/symptoms of physical distress. Continue  to progress workloads to maintain intensity without signs/symptoms of physical distress. Continue to progress workloads to maintain intensity without signs/symptoms of physical distress. Continue to progress workloads to maintain intensity without signs/symptoms of physical distress. Continue to progress workloads to maintain intensity without signs/symptoms of physical distress.     Resistance Training   Training Prescription Yes Yes Yes Yes Yes   Weight 2 2 2 2 3    Reps 10-12 10-12 10-12 10-12 10-12     Treadmill   MPH 1.4 1.5 1.5 1.6 1.9   Grade 0 0.5 0 0 0   Minutes 15 15 15 15 15    METs 2 2.2 2.1 2.2 2.4     NuStep   Level 2 2 2 3 3    Watts 14 14 12 22 14    Minutes 20 20 20 20 20    METs 3.62 3.62 3.62 3.62 3.63     Home Exercise Plan   Plans to continue exercise at  [place]   Frequency Add 2 additional days to program exercise sessions. Add 2 additional days to program exercise sessions. Add 2 additional days to program exercise sessions. Add 2 additional days to program exercise sessions. Add 2 additional days to program exercise sessions.   Row Name 12/28/15 0800             Exercise Review   Progression Yes         Response to Exercise   Blood Pressure (Admit) 100/52       Blood Pressure (Exercise) 110/68       Blood Pressure (Exit) 110/58       Heart Rate (Admit) 64 bpm       Heart Rate (Exercise) 91 bpm       Heart Rate (Exit) 86 bpm       Rating of Perceived Exertion (Exercise) 10       Duration Progress to 30 minutes of continuous aerobic without signs/symptoms of physical distress       Intensity Rest + 30         Progression   Progression Continue to progress workloads to maintain intensity without signs/symptoms of physical distress.         Resistance Training   Training Prescription Yes       Weight 3       Reps 10-12         Treadmill   MPH 2       Grade 0       Minutes 15       METs 2.5         NuStep   Level 3       Watts 12       Minutes 20       METs 3.63         Home Exercise Plan   Plans to continue exercise at Home       Frequency Add 2 additional days to program exercise sessions.          Exercise Comments:      Exercise Comments    Row Name 11/10/15 [id number] 11/24/15 1242 12/28/15 0839       Exercise Comments Patient is proggressing appropriately  Patient is progessing appropriately  Patient is proggressing appropriately         Discharge Exercise Prescription (Final Exercise Prescription Changes):     Exercise Prescription Changes - 12/28/15 0800      Exercise Review   Progression Yes  Response to Exercise   Blood Pressure (Admit) 100/52   Blood Pressure (Exercise) 110/68   Blood Pressure (Exit) 110/58   Heart Rate (Admit) 64 bpm   Heart Rate (Exercise) 91 bpm    Heart Rate (Exit) 86 bpm   Rating of Perceived Exertion (Exercise) 10   Duration Progress to 30 minutes of continuous aerobic without signs/symptoms of physical distress   Intensity Rest + 30     Progression   Progression Continue to progress workloads to maintain intensity without signs/symptoms of physical distress.     Resistance Training   Training Prescription Yes   Weight 3   Reps 10-12     Treadmill   MPH 2   Grade 0   Minutes 15   METs 2.5     NuStep   Level 3   Watts 12   Minutes 20   METs 3.63     Home Exercise Plan   Plans to continue exercise at Home   Frequency Add 2 additional days to program exercise sessions.      Nutrition:  Target Goals: Understanding of nutrition guidelines, daily intake of sodium 1500mg , cholesterol 200mg , calories 30% from fat and 7% or less from saturated fats, daily to have 5 or more servings of fruits and vegetables.  Biometrics:     Pre Biometrics - 09/24/15 1452      Pre Biometrics   Height 5\' 7"  (1.702 m)   Weight 205 lb 0.4 oz (93 kg)   Waist Circumference 43 inches   Hip Circumference 43 inches   Waist to Hip Ratio 1 %   BMI (Calculated) 32.2   Triceps Skinfold 15 mm   % Body Fat 30.9 %   Grip Strength 70 kg   Flexibility 13.7 in   Single Leg Stand 3 seconds       Nutrition Therapy Plan and Nutrition Goals:   Nutrition Discharge: Rate Your Plate Scores:     Nutrition Assessments - 09/24/15 1546      MEDFICTS Scores   Pre Score 27      Nutrition Goals Re-Evaluation:   Psychosocial: Target Goals: Acknowledge presence or absence of depression, maximize coping skills, provide positive support system. Participant is able to verbalize types and ability to use techniques and skills needed for reducing stress and depression.  Initial Review & Psychosocial Screening:     Initial Psych Review & Screening - 09/24/15 1649      Initial Review   Current issues with --  No abuse or negelect issues.       Family Dynamics   Good Support System? Yes     Barriers   Psychosocial barriers to participate in program There are no identifiable barriers or psychosocial needs.     Screening Interventions   Interventions Encouraged to exercise      Quality of Life Scores:     Quality of Life - 09/24/15 1454      Quality of Life Scores   Health/Function Pre 24.17 %   Socioeconomic Pre 25.5 %   Psych/Spiritual Pre 24.14 %   Family Pre 27.75 %   GLOBAL Pre 24.86 %      PHQ-9: Recent Review Flowsheet Data    Depression screen [facility] 2/9 09/24/2015   Decreased Interest 0   Down, Depressed, Hopeless 1   PHQ - 2 Score 1   Altered sleeping 2   Tired, decreased energy 2   Change in appetite 3   Feeling bad or failure about yourself  1   Trouble concentrating 0   Moving slowly or fidgety/restless 1   Suicidal thoughts 0   PHQ-9 Score 10   Difficult doing work/chores Somewhat difficult      Psychosocial Evaluation and Intervention:     Psychosocial Evaluation - 09/24/15 1650      Psychosocial Evaluation & Interventions   Interventions Encouraged to exercise with the program and follow exercise prescription;Stress management education;Relaxation education   Comments Patient is already seeing an counselor for his depression   Continued Psychosocial Services Needed Yes      Psychosocial Re-Evaluation:     Psychosocial Re-Evaluation    [place] Name 10/11/15 1850 11/11/15 0813 12/01/15 1447 12/30/15 1255       Psychosocial Re-Evaluation   Interventions Encouraged to attend Cardiac Rehabilitation for the exercise  -  - Encouraged to attend Cardiac Rehabilitation for the exercise    Comments  - Patients does have some psychosocial evidenced by his goal of being able to be around other people and his PHQ-9 score was 10. His QOL score was 24.86. He is seeing a Social worker.  Patient's QOL score was 24.86. He has no psychosocial issues and does not need counseling.  Patients does have some  psychosocial issues evidenced by his goal of being able to be around other people and his PHQ-9 score was 10. His QOL score was 24.86. He is doing well in the program and interacts well with staff and other people in his class.     Continued Psychosocial Services Needed No Yes  Patient is seeing a Social worker.  No  -       Vocational Rehabilitation: Provide vocational rehab assistance to qualifying candidates.   Vocational Rehab Evaluation & Intervention:     Vocational Rehab - 09/24/15 1541      Initial Vocational Rehab Evaluation & Intervention   Assessment shows need for Vocational Rehabilitation No      Education: Education Goals: Education classes will be provided on a weekly basis, covering required topics. Participant will state understanding/return demonstration of topics presented.  Learning Barriers/Preferences:     Learning Barriers/Preferences - 09/24/15 1539      Learning Barriers/Preferences   Learning Barriers None   Learning Preferences Skilled Demonstration      Education Topics: Hypertension, Hypertension Reduction -Define heart disease and high blood pressure. Discus how high blood pressure affects the body and ways to reduce high blood pressure. Flowsheet Row CARDIAC REHAB PHASE II EXERCISE from 12/22/2015 in [place]  Date  11/17/15  Educator  DC  Instruction Review Code  2- meets goals/outcomes      Exercise and Your Heart -Discuss why it is important to exercise, the FITT principles of exercise, normal and abnormal responses to exercise, and how to exercise safely. Flowsheet Row CARDIAC REHAB PHASE II EXERCISE from 12/22/2015 in [place]  Date  11/24/15  Educator  DC  Instruction Review Code  2- meets goals/outcomes      Angina -Discuss definition of angina, causes of angina, treatment of angina, and how to decrease risk of having angina.   Cardiac Medications -Review what the following cardiac  medications are used for, how they affect the body, and side effects that may occur when taking the medications.  Medications include Aspirin, Beta blockers, calcium channel blockers, ACE Inhibitors, angiotensin receptor blockers, diuretics, digoxin, and antihyperlipidemics. Flowsheet Row CARDIAC REHAB PHASE II EXERCISE from 12/22/2015 in [place]  Date  12/08/15  Educator  Russella Dar  Instruction Review Code  2- meets goals/outcomes      Congestive Heart Failure -Discuss the definition of CHF, how to live with CHF, the signs and symptoms of CHF, and how keep track of weight and sodium intake.   Heart Disease and Intimacy -Discus the effect sexual activity has on the heart, how changes occur during intimacy as we age, and safety during sexual activity. Flowsheet Row CARDIAC REHAB PHASE II EXERCISE from 12/22/2015 in [place]  Date  12/22/15  Educator  DJ  Instruction Review Code  2- meets goals/outcomes      Smoking Cessation / COPD -Discuss different methods to quit smoking, the health benefits of quitting smoking, and the definition of COPD. Flowsheet Row CARDIAC REHAB PHASE II EXERCISE from 12/22/2015 in [place]  Date  09/29/15  Educator  Russella Dar  Instruction Review Code  2- meets goals/outcomes      Nutrition I: Fats -Discuss the types of cholesterol, what cholesterol does to the heart, and how cholesterol levels can be controlled. Flowsheet Row CARDIAC REHAB PHASE II EXERCISE from 12/22/2015 in [place]  Date  10/06/15  Educator  Russella Dar  Instruction Review Code  2- meets goals/outcomes      Nutrition II: Labels -Discuss the different components of food labels and how to read food label [place] from 12/22/2015 in [place]  Date  10/13/15  Educator  Russella Dar  Instruction Review Code  2- meets goals/outcomes       Heart Parts and Heart Disease -Discuss the anatomy of the heart, the pathway of blood circulation through the heart, and these are affected by heart disease.   Stress I: Signs and Symptoms -Discuss the causes of stress, how stress may lead to anxiety and depression, and ways to limit stress.   Stress II: Relaxation -Discuss different types of relaxation techniques to limit stress. Flowsheet Row CARDIAC REHAB PHASE II EXERCISE from 12/22/2015 in [place]  Date  11/03/15  Educator  DJ  Instruction Review Code  2- meets goals/outcomes      Warning Signs of Stroke / TIA -Discuss definition of a stroke, what the signs and symptoms are of a stroke, and how to identify when someone is having stroke. Flowsheet Row CARDIAC REHAB PHASE II EXERCISE from 12/22/2015 in [place]  Date  11/10/15  Educator  DJ/DC  Instruction Review Code  2- meets goals/outcomes      Knowledge Questionnaire Score:     Knowledge Questionnaire Score - 09/24/15 1540      Knowledge Questionnaire Score   Pre Score 21/24      Core Components/Risk Factors/Patient Goals at Admission:     Personal Goals and Risk Factors at Admission - 09/24/15 1546      Core Components/Risk Factors/Patient Goals on Admission    Weight Management Weight Maintenance   Increase Strength and Stamina Yes   Intervention Provide advice, education, support and counseling about physical activity/exercise needs.;Develop an individualized exercise prescription for aerobic and resistive training based on initial evaluation findings, risk stratification, comorbidities and participant's personal goals.   Diabetes Yes   Intervention Provide education about signs/symptoms and action to take for hypo/hyperglycemia.   Expected Outcomes Long Term: Attainment of HbA1C < 7%.;Short Term: Participant verbalizes understanding of the signs/symptoms and immediate care of hyper/hypoglycemia, proper foot  care and importance of medication, aerobic/resistive exercise and nutrition plan for blood glucose  control.   Personal Goal Other Yes   Personal Goal Get stronger, be around the people in the program.    Intervention Exercise 3 times week in program and supplement 2 more days at home.    Expected Outcomes Get stonger and gain new friends.       Core Components/Risk Factors/Patient Goals Review:      Goals and Risk Factor Review    Row Name 10/11/15 1848 11/11/15 0809 12/01/15 1444 12/30/15 1247       Core Components/Risk Factors/Patient Goals Review   Personal Goals Review Increase Strength and Stamina  Wants to be around people. Increase Strength and Stamina;Other  Be around other people.  Weight Management/Obesity;Increase Strength and Stamina Weight Management/Obesity;Increase Strength and Stamina;Other  Be around other people.     Review Patient says he is getting stronger, he aslo enjoys being around his classmates.  Patient feels like the program is helping him be comfortable being around other people. He is social with other patients and staff. His strength and stamina is increasing.  Patient has lost 1.2 lbs in 22 sessions. He is meeting his goal of increasing his strenght and stamina.  Patient has gained 2.8 lbs in 29 sessions. He is meeting his goal of increasing his strenght and stamina. He is also meeting his goal of being comfortable being around other people. He interacts well with other people in his class and staff. He missed some sessions d/t having some chest pain with activity. He was elvaluated by his MD. No changes or recommendations were made. No c/o chest pain during sessions.      Expected Outcomes Get stronger, continue to enjoy the fellowship Continue to increase strength and stamina and have increased comfort being around other people.  Patient will continue to meet the above stated goals. Patient will continue to attend the program and meet his above stated goals.         Core Components/Risk Factors/Patient Goals at Discharge (Final Review):      Goals and Risk Factor Review - 12/30/15 1247      Core Components/Risk Factors/Patient Goals Review   Personal Goals Review Weight Management/Obesity;Increase Strength and Stamina;Other  Be around other people.    Review Patient has gained 2.8 lbs in 29 sessions. He is meeting his goal of increasing his strenght and stamina. He is also meeting his goal of being comfortable being around other people. He interacts well with other people in his class and staff. He missed some sessions d/t having some chest pain with activity. He was elvaluated by his MD. No changes or recommendations were made. No c/o chest pain during sessions.     Expected Outcomes Patient will continue to attend the program and meet his above stated goals.       ITP Comments:   Comments: Patient doing well with program. Will continue to monitor for progress.

## 2015-12-31 ENCOUNTER — Encounter (HOSPITAL_COMMUNITY): Payer: Managed Care, Other (non HMO)

## 2016-01-03 ENCOUNTER — Other Ambulatory Visit (HOSPITAL_COMMUNITY): Payer: Self-pay | Admitting: *Deleted

## 2016-01-03 ENCOUNTER — Encounter (HOSPITAL_COMMUNITY)
Admission: RE | Admit: 2016-01-03 | Discharge: 2016-01-03 | Disposition: A | Discharge status: Home / Self Care | Payer: Managed Care, Other (non HMO) | Source: Ambulatory Visit | Attending: Cardiology | Admitting: Cardiology

## 2016-01-03 DIAGNOSIS — I214 Non-ST elevation (NSTEMI) myocardial infarction: Secondary | ICD-10-CM

## 2016-01-03 DIAGNOSIS — Z955 Presence of coronary angioplasty implant and graft: Secondary | ICD-10-CM | POA: Diagnosis not present

## 2016-01-03 MED ORDER — NITROGLYCERIN 0.4 MG SL SUBL: 0.4000 mg | SUBLINGUAL_TABLET | SUBLINGUAL | 3 refills | Status: DC | PRN

## 2016-01-03 NOTE — Progress Notes (Signed)
Daily Session Note  Patient Details  Name: [name] MRN: [id number] Date of Birth: Jan 18, 1941 Referring Provider:   Flowsheet Row CARDIAC REHAB PHASE II EXERCISE from 09/24/2015 in [place]  Referring Provider  Dr. Aundra Dubin      Encounter Date: 01/03/2016  Check In:     Session Check In - 01/03/16 1100      Check-In   Location [facility]   Staff Present Diane Angelina Pih, MS, EP, [facility], Exercise Physiologist;Campbell Agramonte Luther Parody, BS, EP, Exercise Physiologist   Supervising physician immediately available to respond to emergencies See telemetry face sheet for immediately available MD   Medication changes reported     No   Fall or balance concerns reported    No   Warm-up and Cool-down Performed as group-led instruction   Resistance Training Performed Yes   VAD Patient? No     VAD patient   Has back up controller? No     Pain Assessment   Currently in Pain? No/denies   Pain Score 0-No pain   Multiple Pain Sites No      Capillary Blood Glucose: No results found for this or any previous visit (from the past 24 hour(s)).   Goals Met:  Independence with exercise equipment Exercise tolerated well No report of cardiac concerns or symptoms Strength training completed today  Goals Unmet:  Not Applicable  Comments: Check out 1200   Dr. Kate Sable is Medical Director for [place] and Pulmonary Rehab.

## 2016-01-04 ENCOUNTER — Encounter: Payer: Self-pay | Admitting: Internal Medicine

## 2016-01-05 ENCOUNTER — Encounter (HOSPITAL_COMMUNITY): Payer: Managed Care, Other (non HMO)

## 2016-01-05 ENCOUNTER — Encounter (HOSPITAL_COMMUNITY)
Admission: RE | Admit: 2016-01-05 | Discharge: 2016-01-05 | Disposition: A | Discharge status: Home / Self Care | Payer: Managed Care, Other (non HMO) | Source: Ambulatory Visit | Attending: Cardiology | Admitting: Cardiology

## 2016-01-05 ENCOUNTER — Encounter: Payer: Self-pay | Admitting: Endocrinology

## 2016-01-05 ENCOUNTER — Ambulatory Visit (INDEPENDENT_AMBULATORY_CARE_PROVIDER_SITE_OTHER): Payer: Managed Care, Other (non HMO)

## 2016-01-05 ENCOUNTER — Other Ambulatory Visit (HOSPITAL_COMMUNITY): Payer: Self-pay | Admitting: Cardiology

## 2016-01-05 ENCOUNTER — Ambulatory Visit (INDEPENDENT_AMBULATORY_CARE_PROVIDER_SITE_OTHER): Payer: Managed Care, Other (non HMO) | Admitting: Endocrinology

## 2016-01-05 VITALS — BP 130/74 | HR 81 | Temp 98.0°F | Resp 16 | Ht 70.0 in | Wt 205.0 lb

## 2016-01-05 DIAGNOSIS — Z794 Long term (current) use of insulin: Secondary | ICD-10-CM | POA: Diagnosis not present

## 2016-01-05 DIAGNOSIS — I5022 Chronic systolic (congestive) heart failure: Secondary | ICD-10-CM | POA: Diagnosis not present

## 2016-01-05 DIAGNOSIS — I255 Ischemic cardiomyopathy: Secondary | ICD-10-CM

## 2016-01-05 DIAGNOSIS — Z955 Presence of coronary angioplasty implant and graft: Secondary | ICD-10-CM | POA: Diagnosis not present

## 2016-01-05 DIAGNOSIS — E119 Type 2 diabetes mellitus without complications: Secondary | ICD-10-CM | POA: Diagnosis not present

## 2016-01-05 DIAGNOSIS — Z95810 Presence of automatic (implantable) cardiac defibrillator: Secondary | ICD-10-CM | POA: Diagnosis not present

## 2016-01-05 DIAGNOSIS — E1165 Type 2 diabetes mellitus with hyperglycemia: Secondary | ICD-10-CM

## 2016-01-05 DIAGNOSIS — I214 Non-ST elevation (NSTEMI) myocardial infarction: Secondary | ICD-10-CM

## 2016-01-05 LAB — POCT GLYCOSYLATED HEMOGLOBIN (HGB A1C): HEMOGLOBIN A1C: 8.2

## 2016-01-05 MED ORDER — INSULIN REGULAR HUMAN (CONC) 500 UNIT/ML ~~LOC~~ SOPN: PEN_INJECTOR | SUBCUTANEOUS | 0 refills | Status: DC

## 2016-01-05 NOTE — Progress Notes (Signed)
EPIC Encounter for ICM Monitoring  Patient Name: [name] is a 75 y.o. male Date: 01/05/2016 Primary Care Physican: Mayra Neer, MD Primary Cardiologist: Aundra Dubin Electrophysiologist: Allred Dry Weight: 205 lb  Bi-V Pacing:  98.7%       Heart Failure questions reviewed, pt asymptomatic   Thoracic impedance abnormal suggesting fluid accumulation.  Recommendations:   Reviewed with Dr Rayann Heman in the office.  Recommended to increase Furosemide 40 mg 2 tablets twice a day x 2 days only and then after 2nd day return to 1 tablet bid.  Increase Potassium 20 mEq to 1 tablet bid x 2 days only and then return to prescribed dosage of 1 tablet every morning.  Call to patient and gave recommendations.  He verbalized understanding.  Advised to call if he has any difficulty taking the extra medication to call.     Follow-up plan: ICM clinic phone appointment on 01/07/2016 to repeat fluid levels.  Patient is going out of town for a week starting 01/09/2016.    Copy of ICM check sent to primary cardiologist and device physician.   ICM trend: 01/05/2016       [name], RN 01/05/2016 3:47 PM

## 2016-01-05 NOTE — Patient Instructions (Addendum)
Reduce cereal and add egg or low fat dairy/protein in am  Humulin R U-500 insulin to be taken 40-60 min before meal  30 units before BFST, 20 at lunch and 35 before supper  Check blood sugars on waking up  3x per week  Also check blood sugars about 2 hours after a meal and do this after different meals by rotation  Recommended blood sugar levels on waking up is 90-130 and about 2 hours after meal is 130-160  Please bring your blood sugar monitor to each visit, thank you  Call sugar readings in 1 week

## 2016-01-05 NOTE — Progress Notes (Signed)
Daily Session Note  Patient Details  Name: [name] MRN: [id number] Date of Birth: September 27, 1940 Referring Provider:   Flowsheet Row CARDIAC REHAB PHASE II EXERCISE from 09/24/2015 in [place]  Referring Provider  Dr. Aundra Dubin      Encounter Date: 01/05/2016  Check In:     Session Check In - 01/05/16 1100      Check-In   Location [facility]   Staff Present Diane Angelina Pih, MS, EP, [facility], Exercise Physiologist;Yama Nielson Luther Parody, BS, EP, Exercise Physiologist   Supervising physician immediately available to respond to emergencies See telemetry face sheet for immediately available MD   Medication changes reported     No   Fall or balance concerns reported    No   Warm-up and Cool-down Performed as group-led instruction   Resistance Training Performed Yes   VAD Patient? No     VAD patient   Has back up controller? No     Pain Assessment   Currently in Pain? No/denies   Pain Score 0-No pain   Multiple Pain Sites No      Capillary Blood Glucose: No results found for this or any previous visit (from the past 24 hour(s)).   Goals Met:  Independence with exercise equipment Exercise tolerated well No report of cardiac concerns or symptoms Strength training completed today  Goals Unmet:  Not Applicable  Comments: Check out 1200   Dr. Kate Sable is Medical Director for [place] and Pulmonary Rehab.

## 2016-01-05 NOTE — Progress Notes (Signed)
Patient ID: [name], male   DOB: Jan 15, 1941, 75 y.o.   MRN: [id number]           Reason for Appointment: Follow-up for Type 2 Diabetes  Referring physician: Brigitte Pulse  History of Present Illness:          Date of diagnosis of type 2 diabetes mellitus: 1997       Background history:  He was initially treated with oral agents and also at some point was given Victoza. Most of his diabetes treatment has been with insulin which he has taken since at least 2005 Although he had improved control with Victoza he stopped this as he started having some low sugars Although he was tried on metformin in 2012 this had been subsequently stopped because of renal dysfunction He was initially treated with 70/30 insulin but subsequently was given Apidra and Lantus He was taking about 90 units of Lantus daily in split doses and 35-40 units Apidra previously with each meal He thinks that about a year ago one of his physicians  In 2015 this was changed to  Humalog 50/50  Recent history:  INSULIN regimen is described as: Humalog 50/50 before meals  20-15-30  His A1c is somewhat better 8.2, previously higher  Current management, blood sugar patterns and problems identified:  His insulin dose was increased somewhat on the last visit and he was told to take his insulin before eating  Even though  he thinks he is eating very little at lunchtime his blood sugars are usually very high at suppertime  Also blood sugars are higher after he is eating cereal in the morning and he still has not changed his breakfast despite reminders  Weight is stable and he is trying to exercise some  He has only occasional fasting readings which are normal including today in the office  Still taking metformin in the evening   Compliance with the medical regimen:  fair  Hypoglycemia:   none, he thinks his lowest blood sugar has been about 84  Glucose monitoring:  not done        Glucometer:  One Touch ultra     Blood  Glucose readings    Mean values apply above for all meters except median for One Touch  PRE-MEAL Fasting Lunch Dinner Bedtime Overall  Glucose range: 111-1 41  216-347  151-367     Mean/median:   250   246   Self-care: The diet that the patient has been following is: None .     Meal times: Breakfast:10 am, Sometimes cereal or fruit Lunch:  not consistent  Dinner: 9 p.m.   Typical meal intake: Breakfast is cereal at 10 am                Dietician visit, most recent: 2013                Exercise:Gardening, treadmill, Walking about 3 days a week  Weight  History:    Wt Readings from Last 3 Encounters:  01/05/16 205 lb (93 kg)  11/05/15 205 lb (93 kg)  10/26/15 202 lb 4 oz (91.7 kg)    Glycemic control:   Lab Results  Component Value Date   HGBA1C 8.2 01/05/2016   HGBA1C 9.2 (H) 10/04/2015   HGBA1C 8.9 (H) 01/05/2012   Lab Results  Component Value Date   LDLCALC 80 02/17/2015   CREATININE 1.35 (H) 10/26/2015    Last Creatinine 2.9, Last microalbumin/creatinine ratio 112 in 3/16  Medication List       Accurate as of 01/05/16  9:26 AM. Always use your most recent med list.          allopurinol 100 MG tablet Commonly known as:  ZYLOPRIM Take 100 mg by mouth 2 (two) times daily.   aspirin EC 81 MG tablet Take 81 mg by mouth daily.   atorvastatin 80 MG tablet Commonly known as:  LIPITOR Take 1 tablet (80 mg total) by mouth daily at 6 PM.   carvedilol 12.5 MG tablet Commonly known as:  COREG Take 1.5 tablets (18.75 mg total) by mouth 2 (two) times daily with a meal.   clopidogrel 75 MG tablet Commonly known as:  PLAVIX Take 1 tablet (75 mg total) by mouth daily.   colchicine 0.6 MG tablet Take 1 tablet (0.6 mg total) by mouth daily as needed (for gout flare).   eplerenone 25 MG tablet Commonly known as:  INSPRA Take 1 tablet (25 mg total) by mouth every evening.   furosemide 40 MG tablet Commonly known as:  LASIX Take 40 mg by mouth 2 (two) times  daily.   Insulin Lispro Prot & Lispro (50-50) 100 UNIT/ML Kwikpen Commonly known as:  HUMALOG MIX 50/50 KWIKPEN 15 units at breakfast and lunch, 30 units before supper.  Take insulin 10 minutes before eating   Insulin Pen Needle 32G X 4 MM Misc Commonly known as:  RELION PEN NEEDLES Use to inject insulin 3 times per day.   insulin regular human CONCENTRATED 500 UNIT/ML kwikpen Commonly known as:  HUMULIN R U-500 KWIKPEN 30 units before BFST, 20 at lunch and 35 before supper, take 30-60 minutes before each meal   isosorbide mononitrate 30 MG 24 hr tablet Commonly known as:  IMDUR Take 1 tablet (30 mg total) by mouth at bedtime.   lisinopril 5 MG tablet Commonly known as:  PRINIVIL,ZESTRIL Take 1 tablet (5 mg total) by mouth daily.   metFORMIN 1000 MG tablet Commonly known as:  GLUCOPHAGE Take 1 tablet (1,000 mg total) by mouth daily with supper.   nitroGLYCERIN 0.4 MG SL tablet Commonly known as:  NITROSTAT Place 1 tablet (0.4 mg total) under the tongue every 5 (five) minutes x 3 doses as needed. For chest pain.   ONE TOUCH ULTRA TEST test strip Generic drug:  glucose blood Use to check blood sugar 3 times per day.   ONETOUCH DELICA LANCETS 99991111 Misc   pantoprazole 40 MG tablet Commonly known as:  PROTONIX Take 1 tablet (40 mg total) by mouth daily.   potassium chloride SA 20 MEQ tablet Commonly known as:  K-DUR,KLOR-CON Take 1 tablet (20 mEq total) by mouth daily.   RANEXA 1000 MG SR tablet Generic drug:  ranolazine Take 500 mg by mouth 2 (two) times daily.   venlafaxine XR 150 MG 24 hr capsule Commonly known as:  EFFEXOR-XR Take 150 mg by mouth at bedtime.   zolpidem 12.5 MG CR tablet Commonly known as:  AMBIEN CR Take 12.5 mg by mouth at bedtime as needed. For sleep       Allergies:  Allergies  Allergen Reactions  . Codeine Nausea And Vomiting  . Latex Rash    Specifies adhesive     Past Medical History:  Diagnosis Date  . Angina   . CAD  (coronary artery disease)    s/p CABG 1997, PCI (BMS) of SVG to RCA 3/09, redo CABG 02/2011 with SVG to PDA, SVG to Lcx, s/p cath 2.2013 with ocluded SVT  to left circ and patent SVG to RCA.  Cath 07/2013 Recent cath showed ischemic cardiomyopathy with LVEF less than 20%, with progressive LV dysfunction related to bypass graft failure, occlusion of the saphenous vein grafts placed in 2012 to the right coronary and to the circum  . Cardiac defibrillator in situ   . CHF (congestive heart failure) ([place])    EF 30% by cath 05/2011  . Chronic systolic dysfunction of left ventricle    EF 30%  . Complication of anesthesia    ONCE WITH BACK SURGERY DIFFICULT TO WAKE UP  . Depression   . DJD (degenerative joint disease)   . DM (diabetes mellitus) ([place]) 02/14/2011  . GERD (gastroesophageal reflux disease)   . Gout   . Heart attack ([place])   . HTN (hypertension) 02/14/2011  . Hyperlipemia   . ICD (implantable cardiac defibrillator) in place   . Ischemic cardiomyopathy    s/p ICD Implantation by Dr Leonia Reeves  . Myocardial infarction ([place]) 1997  . Paroxysmal atrial fibrillation (H[facility]) 10/22/14   single episode of AF x 3 hours 48 minutes recorded on ICD, chads2vasc score is at least 5     Past Surgical History:  Procedure Laterality Date  . BACK SURGERY  2003  . BI-VENTRICULAR IMPLANTABLE CARDIOVERTER DEFIBRILLATOR UPGRADE N/A 08/24/2011   Procedure: BI-VENTRICULAR IMPLANTABLE CARDIOVERTER DEFIBRILLATOR UPGRADE;  Surgeon: Evans Lance, MD;  Location: [facility] CATH LAB;  Service: Cardiovascular;  Laterality: N/A;  . CARDIAC CATHETERIZATION  08/03/2015   Procedure: Left Heart Cath and Cors/Grafts Angiography;  Surgeon: Peter M Martinique, MD;  Location: [facility] CV LAB;  Service: Cardiovascular;;  . CARDIAC CATHETERIZATION N/A 08/06/2015   Procedure: Coronary Stent Intervention w/Impella;  Surgeon: Jettie Booze, MD;  Location: [place] CV LAB;  Service: Cardiovascular;  Laterality: N/A;  . CARDIAC  CATHETERIZATION  08/06/2015   Procedure: Left Heart Cath;  Surgeon: Jettie Booze, MD;  Location: [place] CV LAB;  Service: Cardiovascular;;  . CARDIAC CATHETERIZATION  08/06/2015   Procedure: Coronary Balloon Angioplasty;  Surgeon: Jettie Booze, MD;  Location: [place] CV LAB;  Service: Cardiovascular;;  . CARDIAC DEFIBRILLATOR PLACEMENT  08/2004   initial placement, upgraded to [employer] ICD by Dr Lovena Le 08/24/11 (MDT)  . CARDIAC DEFIBRILLATOR PLACEMENT  2009  . CATARACT EXTRACTION W/ INTRAOCULAR LENS  IMPLANT, BILATERAL  2012  . CHOLECYSTECTOMY  01/05/2012   Procedure: LAPAROSCOPIC CHOLECYSTECTOMY WITH INTRAOPERATIVE CHOLANGIOGRAM;  Surgeon: Joyice Faster. Cornett, MD;  Location: [facility];  Service: General;  Laterality: N/A;  laparoscopic cholecysectoym with intraoperative cholangiogram  . COLONOSCOPY WITH PROPOFOL N/A 11/18/2013   Procedure: COLONOSCOPY WITH PROPOFOL;  Surgeon: Garlan Fair, MD;  Location: [facility] ENDOSCOPY;  Service: Endoscopy;  Laterality: N/A;  . CORONARY ANGIOPLASTY  02/2011  . CORONARY ANGIOPLASTY WITH STENT PLACEMENT  06/2007   BMS to SVG to RCA  . CORONARY ARTERY BYPASS GRAFT  01/1996   CABG x 5 LIMA to LAD SVG to diag1,2,svg to om,svg to RCA  . CORONARY ARTERY BYPASS GRAFT  03/06/2011   CABG X2; Procedure: REDO CORONARY ARTERY BYPASS GRAFTING (CABG);  Surgeon: Grace Isaac, MD;  Location: [facility];  Service: Open Heart Surgery;  Laterality: N/A;  times two grafts using right saphenous vein harvested endoscopically.  Marland Kitchen KNEE ARTHROTOMY  ~ 1978   RIGHT KNEE CARTILAGE REMOVED  . LEFT HEART CATHETERIZATION WITH CORONARY ANGIOGRAM N/A 06/06/2011   Procedure: LEFT HEART CATHETERIZATION WITH CORONARY ANGIOGRAM;  Surgeon: Sueanne Margarita, MD;  Location[facility]  CATH LAB;  Service: Cardiovascular;  Laterality: N/A;  . LEFT HEART CATHETERIZATION WITH CORONARY/GRAFT ANGIOGRAM N/A 08/08/2013   Procedure: LEFT HEART CATHETERIZATION WITH Beatrix Fetters;  Surgeon: Sinclair Grooms, MD;  Location: [facility] CATH LAB;  Service: Cardiovascular;  Laterality: N/A;  . LUMBAR [place] SURGERY  2003  . RIGHT HEART CATHETERIZATION N/A 02/17/2014   Procedure: RIGHT HEART CATH;  Surgeon: Larey Dresser, MD;  Location: [facility] CATH LAB;  Service: Cardiovascular;  Laterality: N/A;  . VENOGRAM N/A 06/08/2011   Procedure: VENOGRAM;  Surgeon: Thompson Grayer, MD;  Location: [facility] CATH LAB;  Service: Cardiovascular;  Laterality: N/A;    Family History  Problem Relation Age of Onset  . Coronary artery disease    . Heart failure Mother   . Heart failure Father   . Heart attack Mother   . Heart attack Father   . Sudden Cardiac Death Brother     in his 24's  . Heart attack Brother   . Cancer Brother     Social History:  reports that he quit smoking about 46 years ago. His smoking use included Cigarettes. He has a 7.50 pack-year smoking history. He quit smokeless tobacco use about 44 years ago. He reports that he drinks about 0.6 oz of alcohol per week . He reports that he does not use drugs.    Review of Systems    Lipid history: He is on Lipitor 80 mg Has history of CAD    Lab Results  Component Value Date   CHOL 147 02/17/2015   HDL 32 (L) 02/17/2015   LDLCALC 80 02/17/2015   TRIG 176 (H) 02/17/2015   CHOLHDL 4.6 02/17/2015           Last foot exam in 07/2015   Physical Examination:  BP 130/74   Pulse 81   Temp 98 F (36.7 C)   Resp 16   Ht 5\' 10"  (1.778 m)   Wt 205 lb (93 kg)   SpO2 97%   BMI 29.41 kg/m       ASSESSMENT:  Diabetes type 2, uncontrolled    See history of present illness for detailed discussion of current diabetes management, blood sugar patterns and problems identified  A1c is slightly better at 8.2 but his blood sugars averaging around 240 at home  His blood sugars are running high during the day and appeared to be near normal fasting He is a good candidate for the V-go pump but he refuses to consider this after discussion on how it will be  used  In needs more insulin during the daytime and also possibly better coverage for his evening meal but since he is on 50/50 insulin cannot increase her suppertime dose and may not benefit from increasing morning dose since he is eating his evening meal about 12 hours after breakfast  PLAN:   For simplicity will change him to Humulin R U-500 insulin 3 times a day with the larger doses at breakfast and suppertime  Discussed how this is different from normal insulin and the formulation in the pen has been adjusted to deliver units similar to the normal insulin  He does need check his blood sugars more consistently fasting after breakfast and lunch and after evening meal also and this was discussed  She will start with 30 units at breakfast, 20 at lunch and 35 before supper  He will call if he has any unusually high or low readings  Also review blood sugars by phone next  week  Continue metformin, will need follow-up renal function  He will get his flu vaccine with PCP  Patient Instructions  Reduce cereal and add egg or low fat dairy/protein in am  Humulin R U-500 insulin to be taken 40-60 min before meal  30 units before BFST, 20 at lunch and 35 before supper  Check blood sugars on waking up  3x per week  Also check blood sugars about 2 hours after a meal and do this after different meals by rotation  Recommended blood sugar levels on waking up is 90-130 and about 2 hours after meal is 130-160  Please bring your blood sugar monitor to each visit, thank you  Call sugar readings in 1 week     Counseling time on subjects discussed above is over 50% of today's 25 minute visit  Tauheedah Bok 01/05/2016, 9:26 AM   Note: This office note was prepared with Estate agent. Any transcriptional errors that result from this process are unintentional.

## 2016-01-07 ENCOUNTER — Encounter (HOSPITAL_COMMUNITY): Payer: Managed Care, Other (non HMO)

## 2016-01-07 ENCOUNTER — Telehealth: Payer: Self-pay | Admitting: Cardiology

## 2016-01-07 NOTE — Telephone Encounter (Signed)
Spoke with pt and reminded pt of remote transmission that is due today. Pt verbalized understanding.   

## 2016-01-10 ENCOUNTER — Encounter (HOSPITAL_COMMUNITY): Payer: Managed Care, Other (non HMO)

## 2016-01-11 NOTE — Progress Notes (Signed)
No repeat ICM remote transmission on 01/07/2016.   Office appointment scheduled with Chanetta Marshall, NP 01/26/2016.   Next ICM remote transmission 02/29/2016.

## 2016-01-12 ENCOUNTER — Encounter (HOSPITAL_COMMUNITY): Payer: Managed Care, Other (non HMO)

## 2016-01-14 ENCOUNTER — Encounter (HOSPITAL_COMMUNITY): Payer: Managed Care, Other (non HMO)

## 2016-01-17 ENCOUNTER — Encounter (HOSPITAL_COMMUNITY): Payer: Managed Care, Other (non HMO)

## 2016-01-17 DIAGNOSIS — R1310 Dysphagia, unspecified: Secondary | ICD-10-CM | POA: Diagnosis not present

## 2016-01-17 DIAGNOSIS — H903 Sensorineural hearing loss, bilateral: Secondary | ICD-10-CM | POA: Diagnosis not present

## 2016-01-17 DIAGNOSIS — J342 Deviated nasal septum: Secondary | ICD-10-CM | POA: Diagnosis not present

## 2016-01-17 DIAGNOSIS — J330 Polyp of nasal cavity: Secondary | ICD-10-CM | POA: Diagnosis not present

## 2016-01-17 DIAGNOSIS — H8143 Vertigo of central origin, bilateral: Secondary | ICD-10-CM | POA: Diagnosis not present

## 2016-01-19 ENCOUNTER — Other Ambulatory Visit (HOSPITAL_COMMUNITY): Payer: Self-pay | Admitting: Otolaryngology

## 2016-01-19 DIAGNOSIS — R1319 Other dysphagia: Secondary | ICD-10-CM

## 2016-01-21 ENCOUNTER — Ambulatory Visit (HOSPITAL_COMMUNITY)
Admission: RE | Admit: 2016-01-21 | Discharge: 2016-01-21 | Disposition: A | Discharge status: Home / Self Care | Payer: Managed Care, Other (non HMO) | Source: Ambulatory Visit | Attending: Otolaryngology | Admitting: Otolaryngology

## 2016-01-21 DIAGNOSIS — R1319 Other dysphagia: Secondary | ICD-10-CM

## 2016-01-21 DIAGNOSIS — E785 Hyperlipidemia, unspecified: Secondary | ICD-10-CM | POA: Insufficient documentation

## 2016-01-21 DIAGNOSIS — F329 Major depressive disorder, single episode, unspecified: Secondary | ICD-10-CM | POA: Insufficient documentation

## 2016-01-21 DIAGNOSIS — K219 Gastro-esophageal reflux disease without esophagitis: Secondary | ICD-10-CM | POA: Diagnosis not present

## 2016-01-21 DIAGNOSIS — I252 Old myocardial infarction: Secondary | ICD-10-CM | POA: Diagnosis not present

## 2016-01-21 DIAGNOSIS — R1310 Dysphagia, unspecified: Secondary | ICD-10-CM | POA: Insufficient documentation

## 2016-01-21 DIAGNOSIS — E119 Type 2 diabetes mellitus without complications: Secondary | ICD-10-CM | POA: Diagnosis not present

## 2016-01-21 DIAGNOSIS — I480 Paroxysmal atrial fibrillation: Secondary | ICD-10-CM | POA: Insufficient documentation

## 2016-01-21 DIAGNOSIS — M109 Gout, unspecified: Secondary | ICD-10-CM | POA: Diagnosis not present

## 2016-01-21 DIAGNOSIS — Z951 Presence of aortocoronary bypass graft: Secondary | ICD-10-CM | POA: Diagnosis not present

## 2016-01-21 DIAGNOSIS — I110 Hypertensive heart disease with heart failure: Secondary | ICD-10-CM | POA: Diagnosis not present

## 2016-01-21 DIAGNOSIS — I255 Ischemic cardiomyopathy: Secondary | ICD-10-CM | POA: Diagnosis not present

## 2016-01-21 DIAGNOSIS — I509 Heart failure, unspecified: Secondary | ICD-10-CM | POA: Insufficient documentation

## 2016-01-21 DIAGNOSIS — Z95810 Presence of automatic (implantable) cardiac defibrillator: Secondary | ICD-10-CM | POA: Insufficient documentation

## 2016-01-21 DIAGNOSIS — I2510 Atherosclerotic heart disease of native coronary artery without angina pectoris: Secondary | ICD-10-CM | POA: Diagnosis not present

## 2016-01-21 NOTE — Progress Notes (Signed)
MBSS complete. Full report located under chart review in imaging section.  Anneta Rounds Paiewonsky, M.A. CCC-SLP [phone]  

## 2016-01-24 NOTE — Progress Notes (Signed)
Discharge Summary  Patient Details  Name: [name] MRN: [id number] Date of Birth: June 22, 1940 Referring Provider:   Flowsheet Row CARDIAC REHAB PHASE II EXERCISE from 09/24/2015 in [place]  Referring Provider  Dr. Aundra Dubin       Number of Visits: 31  Reason for Discharge:  Early Exit:  Patient stopped program after completing 31 sessions. He said he was not going to be able to complete the program d/t too many scheduling conflicts.  Smoking History:  History  Smoking Status  . Former Smoker  . Packs/day: 0.50  . Years: 15.00  . Types: Cigarettes  . Quit date: 04/17/1969  Smokeless Tobacco  . Former Systems developer  . Quit date: 05/05/1971    Diagnosis:  NSTEMI (non-ST elevated myocardial infarction) [facility])  ADL UCSD:   Initial Exercise Prescription:     Initial Exercise Prescription - 09/24/15 1400      Date of Initial Exercise RX and Referring Provider   Date 09/24/15   Referring Provider Dr. Aundra Dubin     Treadmill   MPH 1.9   Grade 0   Minutes 15   METs 1.9     NuStep   Level 2   Watts 22   Minutes 15   METs 1.9     Arm Ergometer   Level 2   Watts 23   Minutes 15   METs 2     Prescription Details   Frequency (times per week) 3   Duration Progress to 30 minutes of continuous aerobic without signs/symptoms of physical distress     Intensity   THRR REST +  30   THRR 40-80% of Max Heartrate [phone]   Ratings of Perceived Exertion 11-13   Perceived Dyspnea 0-4     Progression   Progression Continue to progress workloads to maintain intensity without signs/symptoms of physical distress.     Resistance Training   Training Prescription Yes   Weight 1   Reps 10-12      Discharge Exercise Prescription (Final Exercise Prescription Changes):     Exercise Prescription Changes - 12/28/15 0800      Exercise Review   Progression Yes     Response to Exercise   Blood Pressure (Admit) 100/52   Blood Pressure (Exercise) 110/68   Blood Pressure (Exit) 110/58   Heart Rate (Admit) 64 bpm   Heart Rate (Exercise) 91 bpm   Heart Rate (Exit) 86 bpm   Rating of Perceived Exertion (Exercise) 10   Duration Progress to 30 minutes of continuous aerobic without signs/symptoms of physical distress   Intensity Rest + 30     Progression   Progression Continue to progress workloads to maintain intensity without signs/symptoms of physical distress.     Resistance Training   Training Prescription Yes   Weight 3   Reps 10-12     Treadmill   MPH 2   Grade 0   Minutes 15   METs 2.5     NuStep   Level 3   Watts 12   Minutes 20   METs 3.63     Home Exercise Plan   Plans to continue exercise at Home   Frequency Add 2 additional days to program exercise sessions.      Functional Capacity:     6 Minute Walk    Row Name 09/24/15 1449         6 Minute Walk   Phase Initial     Distance 1025 feet  Walk Time 6 minutes     # of Rest Breaks 0     MPH 1.94     METS 2.48     RPE 11     Perceived Dyspnea  11     VO2 Peak 8.19     Symptoms No     Resting HR 89 bpm     Resting BP 120/60     Max Ex. HR 114 bpm     Max Ex. BP 156/66     2 Minute Post BP 124/64        Psychological, QOL, Others - Outcomes: PHQ 2/9: Depression screen PHQ 2/9 09/24/2015  Decreased Interest 0  Down, Depressed, Hopeless 1  PHQ - 2 Score 1  Altered sleeping 2  Tired, decreased energy 2  Change in appetite 3  Feeling bad or failure about yourself  1  Trouble concentrating 0  Moving slowly or fidgety/restless 1  Suicidal thoughts 0  PHQ-9 Score 10  Difficult doing work/chores Somewhat difficult  Some recent data might be hidden    Quality of Life:     Quality of Life - 09/24/15 1454      Quality of Life Scores   Health/Function Pre 24.17 %   Socioeconomic Pre 25.5 %   Psych/Spiritual Pre 24.14 %   Family Pre 27.75 %   GLOBAL Pre 24.86 %      Personal Goals: Goals established at orientation with interventions  provided to work toward goal.     Personal Goals and Risk Factors at Admission - 09/24/15 1546      Core Components/Risk Factors/Patient Goals on Admission    Weight Management Weight Maintenance   Increase Strength and Stamina Yes   Intervention Provide advice, education, support and counseling about physical activity/exercise needs.;Develop an individualized exercise prescription for aerobic and resistive training based on initial evaluation findings, risk stratification, comorbidities and participant's personal goals.   Diabetes Yes   Intervention Provide education about signs/symptoms and action to take for hypo/hyperglycemia.   Expected Outcomes Long Term: Attainment of HbA1C < 7%.;Short Term: Participant verbalizes understanding of the signs/symptoms and immediate care of hyper/hypoglycemia, proper foot care and importance of medication, aerobic/resistive exercise and nutrition plan for blood glucose control.   Personal Goal Other Yes   Personal Goal Get stronger, be around the people in the program.    Intervention Exercise 3 times week in program and supplement 2 more days at home.    Expected Outcomes Get stonger and gain new friends.        Personal Goals Discharge:     Goals and Risk Factor Review    Row Name 10/11/15 1848 11/11/15 0809 12/01/15 1444 12/30/15 1247       Core Components/Risk Factors/Patient Goals Review   Personal Goals Review Increase Strength and Stamina  Wants to be around people. Increase Strength and Stamina;Other  Be around other people.  Weight Management/Obesity;Increase Strength and Stamina Weight Management/Obesity;Increase Strength and Stamina;Other  Be around other people.     Review Patient says he is getting stronger, he aslo enjoys being around his classmates.  Patient feels like the program is helping him be comfortable being around other people. He is social with other patients and staff. His strength and stamina is increasing.  Patient has  lost 1.2 lbs in 22 sessions. He is meeting his goal of increasing his strenght and stamina.  Patient has gained 2.8 lbs in 29 sessions. He is meeting his goal of increasing his strenght  and stamina. He is also meeting his goal of being comfortable being around other people. He interacts well with other people in his class and staff. He missed some sessions d/t having some chest pain with activity. He was elvaluated by his MD. No changes or recommendations were made. No c/o chest pain during sessions.      Expected Outcomes Get stronger, continue to enjoy the fellowship Continue to increase strength and stamina and have increased comfort being around other people.  Patient will continue to meet the above stated goals. Patient will continue to attend the program and meet his above stated goals.        Nutrition & Weight - Outcomes:     Pre Biometrics - 09/24/15 1452      Pre Biometrics   Height 5\' 7"  (1.702 m)   Weight 205 lb 0.4 oz (93 kg)   Waist Circumference 43 inches   Hip Circumference 43 inches   Waist to Hip Ratio 1 %   BMI (Calculated) 32.2   Triceps Skinfold 15 mm   % Body Fat 30.9 %   Grip Strength 70 kg   Flexibility 13.7 in   Single Leg Stand 3 seconds       Nutrition:   Nutrition Discharge:     Nutrition Assessments - 09/24/15 1546      MEDFICTS Scores   Pre Score 27      Education Questionnaire Score:     Knowledge Questionnaire Score - 09/24/15 1540      Knowledge Questionnaire Score   Pre Score 21/24

## 2016-01-24 NOTE — Addendum Note (Signed)
Encounter addended by: Dwana Melena, RN on: 01/24/2016  2:55 PM<BR>    Actions taken: Visit diagnoses modified, Visit Navigator Flowsheet section accepted, Sign clinical note, Episode resolved

## 2016-01-26 NOTE — Progress Notes (Signed)
Electrophysiology Office Note Date: 01/27/2016  ID:  [name], DOB Sep 26, 1940, MRN [id number]  PCP: Mayra Neer, MD Primary Cardiologist: Aundra Dubin Electrophysiologist: Allred  CC: Routine ICD follow-up  [name] is a 75 y.o. male seen today for Dr Rayann Heman.  He presents today for routine electrophysiology followup.  Since last being seen in our clinic, the patient reports doing very well. He denies chest pain, palpitations, dyspnea, PND, orthopnea, nausea, vomiting, dizziness, syncope, edema, weight gain, or early satiety.  He has not had ICD shocks.   Device History: MDT dual chamber ICD implanted 2011 for ICM; upgrade to CRTD 2013 History of appropriate therapy: No History of AAD therapy: No   Past Medical History:  Diagnosis Date  . CAD (coronary artery disease)    s/p CABG 1997, PCI (BMS) of SVG to RCA 3/09, redo CABG 02/2011 with SVG to PDA, SVG to Lcx, s/p cath 2.2013 with ocluded SVT to left circ and patent SVG to RCA.  Cath 07/2013 Recent cath showed ischemic cardiomyopathy with LVEF less than 20%, with progressive LV dysfunction related to bypass graft failure, occlusion of the saphenous vein grafts placed in 2012 to the right coronary and to the circum  . Chronic systolic dysfunction of left ventricle    EF 30%  . Depression   . DJD (degenerative joint disease)   . DM (diabetes mellitus) ([name]) 02/14/2011  . GERD (gastroesophageal reflux disease)   . Gout   . HTN (hypertension) 02/14/2011  . Hyperlipemia   . Ischemic cardiomyopathy    s/p ICD Implantation by Dr Leonia Reeves  . Paroxysmal atrial fibrillation ([facility]) 10/22/2014   single episode of AF x 3 hours 48 minutes recorded on ICD, chads2vasc score is at least 5    Past Surgical History:  Procedure Laterality Date  . BI-VENTRICULAR IMPLANTABLE CARDIOVERTER DEFIBRILLATOR UPGRADE N/A 08/24/2011   Procedure: BI-VENTRICULAR IMPLANTABLE CARDIOVERTER DEFIBRILLATOR UPGRADE;  Surgeon: Evans Lance, MD;  Location:  [facility] CATH LAB;  Service: Cardiovascular;  Laterality: N/A;  . CARDIAC CATHETERIZATION  08/03/2015   Procedure: Left Heart Cath and Cors/Grafts Angiography;  Surgeon: Peter M Martinique, MD;  Location: [place] CV LAB;  Service: Cardiovascular;;  . CARDIAC CATHETERIZATION N/A 08/06/2015   Procedure: Coronary Stent Intervention w/Impella;  Surgeon: Jettie Booze, MD;  Location: [place] CV LAB;  Service: Cardiovascular;  Laterality: N/A;  . CARDIAC CATHETERIZATION  08/06/2015   Procedure: Left Heart Cath;  Surgeon: Jettie Booze, MD;  Location: [place] CV LAB;  Service: Cardiovascular;;  . CARDIAC CATHETERIZATION  08/06/2015   Procedure: Coronary Balloon Angioplasty;  Surgeon: Jettie Booze, MD;  Location: [place] CV LAB;  Service: Cardiovascular;;  . CARDIAC DEFIBRILLATOR PLACEMENT  08/2004   initial placement, upgraded to [place] ICD by Dr Lovena Le 08/24/11 (MDT)  . CATARACT EXTRACTION W/ INTRAOCULAR LENS  IMPLANT, BILATERAL  2012  . CHOLECYSTECTOMY  01/05/2012   Procedure: LAPAROSCOPIC CHOLECYSTECTOMY WITH INTRAOPERATIVE CHOLANGIOGRAM;  Surgeon: Joyice Faster. Cornett, MD;  Location: [place];  Service: General;  Laterality: N/A;  laparoscopic cholecysectoym with intraoperative cholangiogram  . COLONOSCOPY WITH PROPOFOL N/A 11/18/2013   Procedure: COLONOSCOPY WITH PROPOFOL;  Surgeon: Garlan Fair, MD;  Location: [facility] ENDOSCOPY;  Service: Endoscopy;  Laterality: N/A;  . CORONARY ANGIOPLASTY WITH STENT PLACEMENT  06/2007   BMS to SVG to RCA  . CORONARY ARTERY BYPASS GRAFT  01/1996   CABG x 5 LIMA to LAD SVG to diag1,2,svg to om,svg to RCA  . CORONARY ARTERY BYPASS GRAFT  03/06/2011   CABG X2; Procedure: REDO CORONARY ARTERY BYPASS GRAFTING (CABG);  Surgeon: Grace Isaac, MD;  Location: [place];  Service: Open Heart Surgery;  Laterality: N/A;  times two grafts using right saphenous vein harvested endoscopically.  Marland Kitchen KNEE ARTHROTOMY  ~ 1978   RIGHT KNEE CARTILAGE REMOVED  . LEFT HEART  CATHETERIZATION WITH CORONARY ANGIOGRAM N/A 06/06/2011   Procedure: LEFT HEART CATHETERIZATION WITH CORONARY ANGIOGRAM;  Surgeon: Sueanne Margarita, MD;  Location: [place] CATH LAB;  Service: Cardiovascular;  Laterality: N/A;  . LEFT HEART CATHETERIZATION WITH CORONARY/GRAFT ANGIOGRAM N/A 08/08/2013   Procedure: LEFT HEART CATHETERIZATION WITH Beatrix Fetters;  Surgeon: Sinclair Grooms, MD;  Location: [facility] CATH LAB;  Service: Cardiovascular;  Laterality: N/A;  . LUMBAR [place] [facility]  2003  . RIGHT HEART CATHETERIZATION N/A 02/17/2014   Procedure: RIGHT HEART CATH;  Surgeon: Larey Dresser, MD;  Location: [facility] CATH LAB;  Service: Cardiovascular;  Laterality: N/A;  . VENOGRAM N/A 06/08/2011   Procedure: VENOGRAM;  Surgeon: Thompson Grayer, MD;  Location: [facility] CATH LAB;  Service: Cardiovascular;  Laterality: N/A;    Current Outpatient Prescriptions  Medication Sig Dispense Refill  . allopurinol (ZYLOPRIM) 100 MG tablet Take 100 mg by mouth 2 (two) times daily.     Marland Kitchen aspirin EC 81 MG tablet Take 81 mg by mouth daily.    Marland Kitchen atorvastatin (LIPITOR) 80 MG tablet Take 1 tablet (80 mg total) by mouth daily at 6 PM. 30 tablet 5  . carvedilol (COREG) 12.5 MG tablet Take 1.5 tablets (18.75 mg total) by mouth 2 (two) times daily with a meal. 270 tablet 3  . clopidogrel (PLAVIX) 75 MG tablet Take 1 tablet (75 mg total) by mouth daily. 90 tablet 3  . eplerenone (INSPRA) 25 MG tablet Take 1 tablet (25 mg total) by mouth every evening. 30 tablet 6  . furosemide (LASIX) 40 MG tablet Take 40 mg by mouth 2 (two) times daily.    . Insulin Lispro Prot & Lispro (HUMALOG MIX 50/50 KWIKPEN) (50-50) 100 UNIT/ML Kwikpen 15 units at breakfast and lunch, 30 units before supper.  Take insulin 10 minutes before eating 15 mL 0  . Insulin Pen Needle (RELION PEN NEEDLES) 32G X 4 MM MISC Use to inject insulin 3 times per day. 150 each 2  . insulin regular human CONCENTRATED (HUMULIN R U-500 KWIKPEN) 500 UNIT/ML kwikpen 30 units before BFST,  20 at lunch and 35 before supper, take 30-60 minutes before each meal 2 pen 0  . lisinopril (PRINIVIL,ZESTRIL) 5 MG tablet TAKE ONE TABLET BY MOUTH ONCE DAILY 30 tablet 3  . metFORMIN (GLUCOPHAGE) 1000 MG tablet Take 1 tablet (1,000 mg total) by mouth daily with supper. 30 tablet 3  . nitroGLYCERIN (NITROSTAT) 0.4 MG SL tablet Place 1 tablet (0.4 mg total) under the tongue every 5 (five) minutes x 3 doses as needed. For chest pain. 25 tablet 3  . ONE TOUCH ULTRA TEST test strip Use to check blood sugar 3 times per day. 150 each 2  . ONETOUCH DELICA LANCETS 99991111 MISC     . pantoprazole (PROTONIX) 40 MG tablet Take 1 tablet (40 mg total) by mouth daily. 30 tablet 11  . ranolazine (RANEXA) 1000 MG SR tablet Take 500 mg by mouth 2 (two) times daily.    Marland Kitchen venlafaxine (EFFEXOR-XR) 150 MG 24 hr capsule Take 150 mg by mouth at bedtime.     Marland Kitchen zolpidem (AMBIEN CR) 12.5 MG CR tablet Take 12.5 mg  by mouth at bedtime as needed. For sleep     No current facility-administered medications for this visit.     Allergies:   Codeine and Latex   Social History: Social History   Social History  . Marital status: Married    Spouse name: N/A  . Number of children: N/A  . Years of education: N/A   Occupational History  . Not on file.   Social History Main Topics  . Smoking status: Former Smoker    Packs/day: 0.50    Years: 15.00    Types: Cigarettes    Quit date: 04/17/1969  . Smokeless tobacco: Former Systems developer    Quit date: 05/05/1971  . Alcohol use 0.6 oz/week    1 Cans of beer per week     Comment: occasional  . Drug use: No  . Sexual activity: No   Other Topics Concern  . Not on file   Social History Narrative   Lives in [place], retired Theatre manager for [employer].  He collects antique metal toys    Family History: Family History  Problem Relation Age of Onset  . Heart failure Mother   . Heart attack Mother   . Heart failure Father   . Heart attack Father   . Coronary  artery disease    . Sudden Cardiac Death Brother     in his 50's  . Heart attack Brother   . Cancer Brother     Review of Systems: All other systems reviewed and are otherwise negative except as noted above.   Physical Exam: VS:  BP 100/60   Pulse 63   Ht 5\' 6"  (1.676 m)   Wt 208 lb 9.6 oz (94.6 kg)   SpO2 90%   BMI 33.67 kg/m  , BMI Body mass index is 33.67 kg/m.  GEN- The patient is well appearing, alert and oriented x 3 today.   HEENT: normocephalic, atraumatic; sclera clear, conjunctiva pink; hearing intact; oropharynx clear; neck supple  Lungs- Clear to ausculation bilaterally, normal work of breathing.  No wheezes, rales, rhonchi Heart- Regular rate and rhythm (paced) GI- soft, non-tender, non-distended, bowel sounds present Extremities- no clubbing, cyanosis, or edema  MS- no significant deformity or atrophy Skin- warm and dry, no rash or lesion; ICD pocket well healed Psych- euthymic mood, full affect Neuro- strength and sensation are intact  ICD interrogation- reviewed in detail today,  See PACEART report  EKG:  EKG is not ordered today.  Recent Labs: 08/08/2015: Hemoglobin 11.0; Platelets 276 08/24/2015: B Natriuretic Peptide 146.5 10/04/2015: ALT 16 10/26/2015: BUN 22; Creatinine, Ser 1.35; Potassium 4.0; Sodium 136   Wt Readings from Last 3 Encounters:  01/27/16 208 lb 9.6 oz (94.6 kg)  01/05/16 205 lb (93 kg)  11/05/15 205 lb (93 kg)     Other studies Reviewed: Additional studies/ records that were reviewed today include: Dr Rayann Heman and Dr Claris Gladden office notes  Assessment and Plan:  1.  Chronic systolic dysfunction euvolemic today Stable on an appropriate medical regimen Normal ICD function See [facility] Art report No changes today Previously underwent AV optimization echo  Continue follow up in ICM clinic  2.  HTN Stable No change required today  3.  CAD No recent ischemic symptoms  Continue current therapy  4.  Paroxysmal atrial  fibrillation No recurrence by device interrogation today  CHADS2VASC is at least 5 If he has recurrence, would consider changing ASA/Plavix to [facility] Will follow remotely     Current medicines are reviewed  at length with the patient today.   The patient does not have concerns regarding his medicines.  The following changes were made today:  none  Labs/ tests ordered today include:   Orders Placed This Encounter  Procedures  . Implantable device check  . ECHOCARDIOGRAM COMPLETE     Disposition:   Follow up with Dr Aundra Dubin as scheduled, ICM clinic, Dr Rayann Heman 1 year   Signed, Chanetta Marshall, NP 01/27/2016 10:41 AM  [address] [place] [address] [phone] (office) [phone] (fax

## 2016-01-27 ENCOUNTER — Encounter: Payer: Self-pay | Admitting: Internal Medicine

## 2016-01-27 ENCOUNTER — Encounter: Payer: Self-pay | Admitting: Nurse Practitioner

## 2016-01-27 ENCOUNTER — Ambulatory Visit (INDEPENDENT_AMBULATORY_CARE_PROVIDER_SITE_OTHER): Payer: Managed Care, Other (non HMO) | Admitting: Nurse Practitioner

## 2016-01-27 VITALS — BP 100/60 | HR 63 | Ht 66.0 in | Wt 208.6 lb

## 2016-01-27 DIAGNOSIS — I5022 Chronic systolic (congestive) heart failure: Secondary | ICD-10-CM

## 2016-01-27 DIAGNOSIS — I10 Essential (primary) hypertension: Secondary | ICD-10-CM | POA: Diagnosis not present

## 2016-01-27 DIAGNOSIS — I255 Ischemic cardiomyopathy: Secondary | ICD-10-CM

## 2016-01-27 DIAGNOSIS — I480 Paroxysmal atrial fibrillation: Secondary | ICD-10-CM | POA: Diagnosis not present

## 2016-01-27 LAB — CUP PACEART INCLINIC DEVICE CHECK
Date Time Interrogation Session: 20171012094201
Implantable Lead Implant Date: 20111003
Implantable Lead Implant Date: 20111003
Implantable Lead Implant Date: 20130509
Implantable Lead Location: 753858
Implantable Lead Location: 753860
Implantable Lead Model: 6947
MDC IDC LEAD LOCATION: 753859
MDC IDC LEAD MODEL: 4196

## 2016-01-27 NOTE — Patient Instructions (Addendum)
Medication Instructions:   Your physician recommends that you continue on your current medications as directed. Please refer to the Current Medication list given to you today.   If you need a refill on your cardiac medications before your next appointment, please call your pharmacy.  Labwork:  NONE ORDER TODAY    Testing/Procedures: Your physician has requested that you have an echocardiogram. Echocardiography is a painless test that uses sound waves to create images of your heart. It provides your doctor with information about the size and shape of your heart and how well your heart's chambers and valves are working. This procedure takes approximately one hour. There are no restrictions for this procedure.      Follow-Up: Your physician wants you to follow-up in: [place] will receive a reminder letter in the mail two months in advance. If you don't receive a letter, please call our office to schedule the follow-up appointment.   Remote monitoring is used to monitor your Pacemaker of ICD from home. This monitoring reduces the number of office visits required to check your device to one time per year. It allows [place] to keep an eye on the functioning of your device to ensure it is working properly. You are scheduled for a device check from home on . 04/28/16.You may send your transmission at any time that day. If you have a wireless device, the transmission will be sent automatically. After your physician reviews your transmission, you will receive a postcard with your next transmission date.    Any Other Special Instructions Will Be Listed Below (If Applicable).

## 2016-01-28 ENCOUNTER — Ambulatory Visit (HOSPITAL_COMMUNITY)
Admission: RE | Admit: 2016-01-28 | Discharge: 2016-01-28 | Disposition: A | Discharge status: Home / Self Care | Payer: Managed Care, Other (non HMO) | Source: Ambulatory Visit | Attending: Cardiology | Admitting: Cardiology

## 2016-01-28 VITALS — BP 126/82 | HR 82 | Wt 209.0 lb

## 2016-01-28 DIAGNOSIS — K219 Gastro-esophageal reflux disease without esophagitis: Secondary | ICD-10-CM | POA: Insufficient documentation

## 2016-01-28 DIAGNOSIS — Z87891 Personal history of nicotine dependence: Secondary | ICD-10-CM | POA: Insufficient documentation

## 2016-01-28 DIAGNOSIS — E7849 Other hyperlipidemia: Secondary | ICD-10-CM

## 2016-01-28 DIAGNOSIS — I5022 Chronic systolic (congestive) heart failure: Secondary | ICD-10-CM | POA: Insufficient documentation

## 2016-01-28 DIAGNOSIS — Z95810 Presence of automatic (implantable) cardiac defibrillator: Secondary | ICD-10-CM | POA: Insufficient documentation

## 2016-01-28 DIAGNOSIS — I2510 Atherosclerotic heart disease of native coronary artery without angina pectoris: Secondary | ICD-10-CM | POA: Diagnosis not present

## 2016-01-28 DIAGNOSIS — I209 Angina pectoris, unspecified: Secondary | ICD-10-CM

## 2016-01-28 DIAGNOSIS — N183 Chronic kidney disease, stage 3 (moderate): Secondary | ICD-10-CM | POA: Insufficient documentation

## 2016-01-28 DIAGNOSIS — Z794 Long term (current) use of insulin: Secondary | ICD-10-CM | POA: Diagnosis not present

## 2016-01-28 DIAGNOSIS — E785 Hyperlipidemia, unspecified: Secondary | ICD-10-CM | POA: Diagnosis not present

## 2016-01-28 DIAGNOSIS — Z8249 Family history of ischemic heart disease and other diseases of the circulatory system: Secondary | ICD-10-CM | POA: Insufficient documentation

## 2016-01-28 DIAGNOSIS — I200 Unstable angina: Secondary | ICD-10-CM | POA: Diagnosis not present

## 2016-01-28 DIAGNOSIS — Z955 Presence of coronary angioplasty implant and graft: Secondary | ICD-10-CM | POA: Diagnosis not present

## 2016-01-28 DIAGNOSIS — Z7982 Long term (current) use of aspirin: Secondary | ICD-10-CM | POA: Insufficient documentation

## 2016-01-28 DIAGNOSIS — I255 Ischemic cardiomyopathy: Secondary | ICD-10-CM | POA: Insufficient documentation

## 2016-01-28 DIAGNOSIS — E784 Other hyperlipidemia: Secondary | ICD-10-CM | POA: Diagnosis not present

## 2016-01-28 DIAGNOSIS — F329 Major depressive disorder, single episode, unspecified: Secondary | ICD-10-CM | POA: Diagnosis not present

## 2016-01-28 DIAGNOSIS — Z951 Presence of aortocoronary bypass graft: Secondary | ICD-10-CM | POA: Insufficient documentation

## 2016-01-28 DIAGNOSIS — Z7902 Long term (current) use of antithrombotics/antiplatelets: Secondary | ICD-10-CM | POA: Insufficient documentation

## 2016-01-28 DIAGNOSIS — Z79899 Other long term (current) drug therapy: Secondary | ICD-10-CM | POA: Diagnosis not present

## 2016-01-28 DIAGNOSIS — R079 Chest pain, unspecified: Secondary | ICD-10-CM | POA: Diagnosis not present

## 2016-01-28 DIAGNOSIS — E1122 Type 2 diabetes mellitus with diabetic chronic kidney disease: Secondary | ICD-10-CM | POA: Diagnosis not present

## 2016-01-28 DIAGNOSIS — M109 Gout, unspecified: Secondary | ICD-10-CM | POA: Diagnosis not present

## 2016-01-28 LAB — LIPID PANEL
CHOL/HDL RATIO: 3.5 ratio
CHOLESTEROL: 105 mg/dL (ref 0–200)
HDL: 30 mg/dL — AB (ref >40)
LDL Cholesterol: 45 mg/dL (ref 0–99)
TRIGLYCERIDES: 150 mg/dL — AB (ref <150)
VLDL: 30 mg/dL (ref 0–40)

## 2016-01-28 LAB — BASIC METABOLIC PANEL
ANION GAP: 11 (ref 5–15)
BUN: 19 mg/dL (ref 6–20)
CHLORIDE: 105 mmol/L (ref 101–111)
CO2: 22 mmol/L (ref 22–32)
Calcium: 8.4 mg/dL — ABNORMAL LOW (ref 8.9–10.3)
Creatinine, Ser: 1.31 mg/dL — ABNORMAL HIGH (ref 0.61–1.24)
GFR calc Af Amer: >60 mL/min (ref >60)
GFR, EST NON AFRICAN AMERICAN: 52 mL/min — AB (ref >60)
Glucose, Bld: 179 mg/dL — ABNORMAL HIGH (ref 65–99)
POTASSIUM: 3.9 mmol/L (ref 3.5–5.1)
SODIUM: 138 mmol/L (ref 135–145)

## 2016-01-28 MED ORDER — CARVEDILOL 25 MG PO TABS: 25.0000 mg | ORAL_TABLET | Freq: Two times a day (BID) | ORAL | 3 refills | Status: DC

## 2016-01-28 NOTE — Patient Instructions (Signed)
Increase Carvedilol to 25 mg Twice daily   Labs today  Your physician has requested that you have a lexiscan myoview. For further information please visit [email or web address]. Please follow instruction sheet, as given.  We will contact you in 3-4 months to schedule your next appointment.

## 2016-01-30 NOTE — Progress Notes (Signed)
Advanced Heart Failure Clinic Note   Patient ID: [name], male   DOB: November 30, 1940, 75 y.o.   MRN: [id number] PCP: Dr. Lynnda Child Cardiology: Dr Aundra Dubin  39 yo with history of CAD s/p CABG and redo CABG as well as ischemic cardiomyopathy with CRT-D device presents for CHF clinic evaluation.  He had a Cardiolite in the 4/15 showing lateral wall ischemia.  LHC at that time showed all his vein grafts from both CABG surgeries occluded.  The LIMA-LAD was patent and there was a 90% distal LM stenosis.  The lateral ischemia likely corresponded to LCx territory downstream from the LM stenosis.  PCI of the distal LM was thought to be high risk and characteristics of the lesion were not favorable for PCI.  Last echo showed EF 25-30% with moderate RV systolic dysfunction.  He had RHC in 11/15 with normal filling pressures and relatively preserved cardiac index (2.55).    In 4/17, he was admitted with chest pain concerning for unstable angina.  Angiography showed 85% distal LM with 90% ostial LCx stenosis.  LIMA was patent but proximal LAD, proximal RCA, and all SVGs were totally occluded. High had successful DES to LM and PTCA to ostial LCx with Impella support.  [name] was stopped and he was put back on low dose lisinopril due to symptomatic hypotension.   He presents today for followup. Doing cardiac rehab at [facility] and doing well.  No dypsnea with exertion, active outside.  No problems with a flight of steps.  No orthopnea/PND.  No BRBPR/melena.  He has had occasional chest tightness over the last 2 wks, occurring 1-2 times/day, no trigger (not exertional).   Optivol checked today: Fluid index trending up but below threshold, impedance stabilizing. No AT/AF alerts.   Labs (9/15): LDL particle number 816, LDL 57, LFTs normal Labs (10/15): K 4.4, creatinine 1.4 Labs (11/15): K 4.3, creatinine 1.36 Labs (12/15): K 4.8, creatinine 1.34 Labs (3/16): K 4, creatinine 1.38, LDL 46, HDl 35 Labs (7/16): K  4, creatinine 1.39, BNP 211 Labs (03/03/15): K 4.1, creatinine 1.47, BNP 227, LDL 80, HDL 32 Labs (12/16): K 4.6, creatinine 1.12, LDL 68, HDL 34 Labs (4/17): K 3.4, creatinine 1.15 Labs (6/17): K 3.8, creatinine 1.67 Labs (7/17): K 4, creatinine 1.35  PMH: 1. Gout 2. Hyperlipidemia 3. CAD: CABG 1997 and redo 11/12.   - LHC (4/15) with totally occluded LAD, totally occluded RCA, 80% distal LM, 50% mLCx, 2 SVG-RCA grafts totally occluded, 2 SVG-OM grafts totally occluded, patent LIMA-LAD.  Cardiolite prior to 4/15 cath showed lateral wall ischemia (LCx territory).  PCI to distal LM would be a high risk procedure.   - Cardiolite (8/16) with EF 20%, severe scar in RCA and probably LCx territory, minimal peri-infarct ischemia.   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  - Unstable angina 4/17: 85% distal LM with 90% ostial LCx stenosis.  LIMA was patent but proximal LAD, proximal RCA, and all SVGs were totally occluded. He had successful DES to LM and PTCA to ostial LCx with Impella support 4. Ischemic Cardiomyopathy: Medtronic CRT-D device.   - Echo (6/15) with EF 25-30%, moderate LV dilation, inferior and inferolateral akinesis, moderately decreased RV systolic function, mild MR.   - RHC (11/15) with mean RA 5, PA 23/6, mean PCWP 9, CI 2.55.  - Echo (4/17): EF 25-30% with mild MR.  5. H/o cholecystectomy 6. OA 7. Depression 8. Type II diabetes 9. GERD 10. CKD  SH: Married, prior smoker (many years ago), lives in [place].    FH: CAD  ROS: All systems reviewed and negative except as per HPI.   Current Outpatient Prescriptions  Medication Sig Dispense Refill  . allopurinol (ZYLOPRIM) 100 MG tablet Take 100 mg by mouth 2 (two) times daily.     Marland Kitchen aspirin EC 81 MG tablet Take 81 mg by mouth daily.    Marland Kitchen atorvastatin (LIPITOR) 80 MG tablet Take 1 tablet (80 mg total) by mouth daily at 6 PM. 30 tablet 5  . carvedilol (COREG) 25 MG tablet Take 1 tablet  (25 mg total) by mouth 2 (two) times daily with a meal. 60 tablet 3  . clopidogrel (PLAVIX) 75 MG tablet Take 1 tablet (75 mg total) by mouth daily. 90 tablet 3  . eplerenone (INSPRA) 25 MG tablet Take 1 tablet (25 mg total) by mouth every evening. 30 tablet 6  . furosemide (LASIX) 40 MG tablet Take 40 mg by mouth 2 (two) times daily.    . Insulin Lispro Prot & Lispro (HUMALOG MIX 50/50 KWIKPEN) (50-50) 100 UNIT/ML Kwikpen 15 units at breakfast and lunch, 30 units before supper.  Take insulin 10 minutes before eating 15 mL 0  . Insulin Pen Needle (RELION PEN NEEDLES) 32G X 4 MM MISC Use to inject insulin 3 times per day. 150 each 2  . insulin regular human CONCENTRATED (HUMULIN R U-500 KWIKPEN) 500 UNIT/ML kwikpen 30 units before BFST, 20 at lunch and 35 before supper, take 30-60 minutes before each meal 2 pen 0  . lisinopril (PRINIVIL,ZESTRIL) 5 MG tablet TAKE ONE TABLET BY MOUTH ONCE DAILY 30 tablet 3  . metFORMIN (GLUCOPHAGE) 1000 MG tablet Take 1 tablet (1,000 mg total) by mouth daily with supper. 30 tablet 3  . nitroGLYCERIN (NITROSTAT) 0.4 MG SL tablet Place 1 tablet (0.4 mg total) under the tongue every 5 (five) minutes x 3 doses as needed. For chest pain. 25 tablet 3  . ONE TOUCH ULTRA TEST test strip Use to check blood sugar 3 times per day. 150 each 2  . ONETOUCH DELICA LANCETS 99991111 MISC     . pantoprazole (PROTONIX) 40 MG tablet Take 1 tablet (40 mg total) by mouth daily. 30 tablet 11  . ranolazine (RANEXA) 1000 MG SR tablet Take 500 mg by mouth 2 (two) times daily.    Marland Kitchen venlafaxine (EFFEXOR-XR) 150 MG 24 hr capsule Take 150 mg by mouth at bedtime.     Marland Kitchen zolpidem (AMBIEN CR) 12.5 MG CR tablet Take 12.5 mg by mouth at bedtime as needed. For sleep     No current facility-administered medications for this encounter.    BP 126/82 (BP Location: Left Arm, Patient Position: Sitting, Cuff Size: Normal)   Pulse 82   Wt 209 lb (94.8 kg)   SpO2 95%   BMI 33.73 kg/m    Wt Readings from Last 3  Encounters:  01/28/16 209 lb (94.8 kg)  01/27/16 208 lb 9.6 oz (94.6 kg)  01/05/16 205 lb (93 kg)     General: NAD Neck: JVP flat, no thyromegaly or nodule noted Lungs: CTAB, normal effort CV: Nondisplaced PMI.  Heart regular S1/S2, no S3/S4, no murmur.  No carotid bruit.  Normal pedal pulses.  Abdomen: Soft, NT, ND, no HSM.  Skin: Intact without lesions or rashes.  Neurologic: Alert and oriented x 3.  Psych: Normal affect. Extremities: No clubbing or cyanosis. No edema.   Assessment/Plan: 1. Chronic systolic CHF: NYHA class II symptoms,  stable.  EF 25-30% in 4/17 (stable). He has a Medtronic CRT-D device.  Volume status stable - Continue eplerenone 25 mg daily - Off Entresto with low BP.   - Continue lisinopril 5 mg daily. - Continue Lasix 40 mg bid.  BMET today.  - Increase Coreg 25 mg bid. 2. CAD: s/p CABG and redo. Had DES to distal LM, angioplasty to ostial LCx in 4/17.  He has been having episodes of atypical chest pain. - Continue ASA 81, Plavix, statin.  - Continue Imdur, Coreg, and ranolazine 500 mg bid.  - I will arrange for Lexiscan Cardiolite for risk stratification.  3. CKD stage II-III: BMET today. 4. Hyperlipidemia: Check lipids today.    If Cardiolite is ok, followup in 3-4 weeks.  [name] 01/30/2016

## 2016-02-04 ENCOUNTER — Ambulatory Visit (INDEPENDENT_AMBULATORY_CARE_PROVIDER_SITE_OTHER): Payer: Managed Care, Other (non HMO) | Admitting: Endocrinology

## 2016-02-04 ENCOUNTER — Other Ambulatory Visit: Payer: Self-pay | Admitting: *Deleted

## 2016-02-04 VITALS — BP 124/56 | HR 86 | Temp 97.9°F | Resp 16 | Ht 66.0 in | Wt 210.4 lb

## 2016-02-04 DIAGNOSIS — Z794 Long term (current) use of insulin: Secondary | ICD-10-CM | POA: Diagnosis not present

## 2016-02-04 DIAGNOSIS — I200 Unstable angina: Secondary | ICD-10-CM

## 2016-02-04 DIAGNOSIS — E1165 Type 2 diabetes mellitus with hyperglycemia: Secondary | ICD-10-CM | POA: Diagnosis not present

## 2016-02-04 MED ORDER — ONETOUCH ULTRA BLUE VI STRP: ORAL_STRIP | 3 refills | Status: DC

## 2016-02-04 MED ORDER — INSULIN PEN NEEDLE 32G X 4 MM MISC: 2 refills | Status: DC

## 2016-02-04 MED ORDER — INSULIN REGULAR HUMAN (CONC) 500 UNIT/ML ~~LOC~~ SOPN: PEN_INJECTOR | SUBCUTANEOUS | 3 refills | Status: DC

## 2016-02-04 MED ORDER — ONETOUCH DELICA LANCETS 33G MISC: 3 refills | Status: DC

## 2016-02-04 NOTE — Patient Instructions (Addendum)
40units at breakfast, 30 at lunch and 35 before supper More sugars at bedtime  Call in 2 weeks with sugar reading

## 2016-02-04 NOTE — Progress Notes (Signed)
Patient ID: [name], male   DOB: 06/16/1940, 75 y.o.   MRN: [id number]           Reason for Appointment: Follow-up for Type 2 Diabetes  Referring physician: Brigitte Pulse  History of Present Illness:          Date of diagnosis of type 2 diabetes mellitus: 1997       Background history:  He was initially treated with oral agents and also at some point was given Victoza. Most of his diabetes treatment has been with insulin which he has taken since at least 2005 Although he had improved control with Victoza he stopped this as he started having some low sugars Although he was tried on metformin in 2012 this had been subsequently stopped because of renal dysfunction He was initially treated with 70/30 insulin but subsequently was given Apidra and Lantus He was taking about 90 units of Lantus daily in split doses and 35-40 units Apidra previously with each meal He thinks that about a year ago one of his physicians  In 2015 this was changed to  Humalog 50/50  Recent history:   INSULIN regimen currently: Humulin R U-500: 30 units at breakfast , 20 at lunch/ noon and 35 before supper  His A1c is recently better 8.2, previously higher  Current management, blood sugar patterns and problems identified:  His insulin was changed on his last visit from the 50/50 because of inadequate control and significant hyperglycemia in the afternoons and early evenings  He has taken this usually 30 min before eating as indicated and overall blood sugars appear to be better  However fasting blood sugars are inconsistent, with a couple of good readings also  Blood sugars are not as high as before and less fluctuating, previously averaging 246  Does not do as many readings after evening meal as indicated  He ran out of his insulin 4 days ago and did not ask for refill  No hypoglycemia  Still taking metformin in the evening   Compliance with the medical regimen:  fair  Hypoglycemia:   none  Glucose  monitoring:  up to twice a day         Glucometer:  One Touch ultra     Blood Glucose readings  from download:  Mean values apply above for all meters except median for One Touch  PRE-MEAL Fasting Lunch  8-9 PM  Bedtime Overall  Glucose range: 98-252  172-237  126-301  173, 190    Mean/median:   211   192+/-53    Self-care: The diet that the patient has been following is: None .     Meal times: Breakfast:10 am, Sometimes cereal or fruit Lunch:  not consistent  Dinner: 9 p.m.   Typical meal intake: Breakfast is cereal at 10 am                Dietician visit, most recent: 2013                Exercise:Gardening, treadmill, Walking about 3 days a week  Weight  History:    Wt Readings from Last 3 Encounters:  02/04/16 210 lb 6.4 oz (95.4 kg)  01/28/16 209 lb (94.8 kg)  01/27/16 208 lb 9.6 oz (94.6 kg)    Glycemic control:   Lab Results  Component Value Date   HGBA1C 8.2 01/05/2016   HGBA1C 9.2 (H) 10/04/2015   HGBA1C 8.9 (H) 01/05/2012   Lab Results  Component Value Date  LDLCALC 45 01/28/2016   CREATININE 1.31 (H) 01/28/2016    Last Creatinine 2.9, Last microalbumin/creatinine ratio 112 in 3/16     Medication List       Accurate as of 02/04/16 11:59 PM. Always use your most recent med list.          allopurinol 100 MG tablet Commonly known as:  ZYLOPRIM Take 100 mg by mouth 2 (two) times daily.   aspirin EC 81 MG tablet Take 81 mg by mouth daily.   atorvastatin 80 MG tablet Commonly known as:  LIPITOR Take 1 tablet (80 mg total) by mouth daily at 6 PM.   carvedilol 25 MG tablet Commonly known as:  COREG Take 1 tablet (25 mg total) by mouth 2 (two) times daily with a meal.   clopidogrel 75 MG tablet Commonly known as:  PLAVIX Take 1 tablet (75 mg total) by mouth daily.   eplerenone 25 MG tablet Commonly known as:  INSPRA Take 1 tablet (25 mg total) by mouth every evening.   furosemide 40 MG tablet Commonly known as:  LASIX Take 40 mg by mouth 2  (two) times daily.   Insulin Lispro Prot & Lispro (50-50) 100 UNIT/ML Kwikpen Commonly known as:  HUMALOG MIX 50/50 KWIKPEN 15 units at breakfast and lunch, 30 units before supper.  Take insulin 10 minutes before eating   Insulin Pen Needle 32G X 4 MM Misc Commonly known as:  RELION PEN NEEDLES Use to inject insulin 3 times per day.   insulin regular human CONCENTRATED 500 UNIT/ML kwikpen Commonly known as:  HUMULIN R U-500 KWIKPEN 30 units before BFST, 20 at lunch and 35 before supper, take 30-60 minutes before each meal   lisinopril 5 MG tablet Commonly known as:  PRINIVIL,ZESTRIL TAKE ONE TABLET BY MOUTH ONCE DAILY   metFORMIN 1000 MG tablet Commonly known as:  GLUCOPHAGE Take 1 tablet (1,000 mg total) by mouth daily with supper.   nitroGLYCERIN 0.4 MG SL tablet Commonly known as:  NITROSTAT Place 1 tablet (0.4 mg total) under the tongue every 5 (five) minutes x 3 doses as needed. For chest pain.   ONE TOUCH ULTRA TEST test strip Generic drug:  glucose blood Use to check blood sugar 3 times per day.   ONETOUCH DELICA LANCETS 99991111 Misc Use to check blood sugar 3 times per day dx code E11.9   pantoprazole 40 MG tablet Commonly known as:  PROTONIX Take 1 tablet (40 mg total) by mouth daily.   RANEXA 1000 MG SR tablet Generic drug:  ranolazine Take 500 mg by mouth 2 (two) times daily.   venlafaxine XR 150 MG 24 hr capsule Commonly known as:  EFFEXOR-XR Take 150 mg by mouth at bedtime.   zolpidem 12.5 MG CR tablet Commonly known as:  AMBIEN CR Take 12.5 mg by mouth at bedtime as needed. For sleep       Allergies:  Allergies  Allergen Reactions  . Codeine Nausea And Vomiting  . Latex Rash    Specifies adhesive     Past Medical History:  Diagnosis Date  . CAD (coronary artery disease)    s/p CABG 1997, PCI (BMS) of SVG to RCA 3/09, redo CABG 02/2011 with SVG to PDA, SVG to Lcx, s/p cath 2.2013 with ocluded SVT to left circ and patent SVG to RCA.  Cath 07/2013  Recent cath showed ischemic cardiomyopathy with LVEF less than 20%, with progressive LV dysfunction related to bypass graft failure, occlusion of the saphenous vein grafts  placed in 2012 to the right coronary and to the circum  . Chronic systolic dysfunction of left ventricle    EF 30%  . Depression   . DJD (degenerative joint disease)   . DM (diabetes mellitus) ([place]) 02/14/2011  . GERD (gastroesophageal reflux disease)   . Gout   . HTN (hypertension) 02/14/2011  . Hyperlipemia   . Ischemic cardiomyopathy    s/p ICD Implantation by Dr Leonia Reeves  . Paroxysmal atrial fibrillation (HCC) 10/22/2014   single episode of AF x 3 hours 48 minutes recorded on ICD, chads2vasc score is at least 5     Past Surgical History:  Procedure Laterality Date  . BI-VENTRICULAR IMPLANTABLE CARDIOVERTER DEFIBRILLATOR UPGRADE N/A 08/24/2011   Procedure: BI-VENTRICULAR IMPLANTABLE CARDIOVERTER DEFIBRILLATOR UPGRADE;  Surgeon: Evans Lance, MD;  Location: [facility] CATH LAB;  Service: Cardiovascular;  Laterality: N/A;  . CARDIAC CATHETERIZATION  08/03/2015   Procedure: Left Heart Cath and Cors/Grafts Angiography;  Surgeon: Peter M Martinique, MD;  Location: [place] CV LAB;  Service: Cardiovascular;;  . CARDIAC CATHETERIZATION N/A 08/06/2015   Procedure: Coronary Stent Intervention w/Impella;  Surgeon: Jettie Booze, MD;  Location: [place] CV LAB;  Service: Cardiovascular;  Laterality: N/A;  . CARDIAC CATHETERIZATION  08/06/2015   Procedure: Left Heart Cath;  Surgeon: Jettie Booze, MD;  Location: [place] CV LAB;  Service: Cardiovascular;;  . CARDIAC CATHETERIZATION  08/06/2015   Procedure: Coronary Balloon Angioplasty;  Surgeon: Jettie Booze, MD;  Location: [place] CV LAB;  Service: Cardiovascular;;  . CARDIAC DEFIBRILLATOR PLACEMENT  08/2004   initial placement, upgraded to [place] ICD by Dr Lovena Le 08/24/11 (MDT)  . CATARACT EXTRACTION W/ INTRAOCULAR LENS  IMPLANT, BILATERAL  2012  . CHOLECYSTECTOMY   01/05/2012   Procedure: LAPAROSCOPIC CHOLECYSTECTOMY WITH INTRAOPERATIVE CHOLANGIOGRAM;  Surgeon: Joyice Faster. Cornett, MD;  Location: [place];  Service: General;  Laterality: N/A;  laparoscopic cholecysectoym with intraoperative cholangiogram  . COLONOSCOPY WITH PROPOFOL N/A 11/18/2013   Procedure: COLONOSCOPY WITH PROPOFOL;  Surgeon: Garlan Fair, MD;  Location: [facility] ENDOSCOPY;  Service: Endoscopy;  Laterality: N/A;  . CORONARY ANGIOPLASTY WITH STENT PLACEMENT  06/2007   BMS to SVG to RCA  . CORONARY ARTERY BYPASS GRAFT  01/1996   CABG x 5 LIMA to LAD SVG to diag1,2,svg to om,svg to RCA  . CORONARY ARTERY BYPASS GRAFT  03/06/2011   CABG X2; Procedure: REDO CORONARY ARTERY BYPASS GRAFTING (CABG);  Surgeon: Grace Isaac, MD;  Location: [place];  Service: Open Heart Surgery;  Laterality: N/A;  times two grafts using right saphenous vein harvested endoscopically.  Marland Kitchen KNEE ARTHROTOMY  ~ 1978   RIGHT KNEE CARTILAGE REMOVED  . LEFT HEART CATHETERIZATION WITH CORONARY ANGIOGRAM N/A 06/06/2011   Procedure: LEFT HEART CATHETERIZATION WITH CORONARY ANGIOGRAM;  Surgeon: Sueanne Margarita, MD;  Location: [place] CATH LAB;  Service: Cardiovascular;  Laterality: N/A;  . LEFT HEART CATHETERIZATION WITH CORONARY/GRAFT ANGIOGRAM N/A 08/08/2013   Procedure: LEFT HEART CATHETERIZATION WITH Beatrix Fetters;  Surgeon: Sinclair Grooms, MD;  Location: [facility] CATH LAB;  Service: Cardiovascular;  Laterality: N/A;  . LUMBAR [place] SURGERY  2003  . RIGHT HEART CATHETERIZATION N/A 02/17/2014   Procedure: RIGHT HEART CATH;  Surgeon: Larey Dresser, MD;  Location: [facility] CATH LAB;  Service: Cardiovascular;  Laterality: N/A;  . VENOGRAM N/A 06/08/2011   Procedure: VENOGRAM;  Surgeon: Thompson Grayer, MD;  Location: [facility] CATH LAB;  Service: Cardiovascular;  Laterality: N/A;    Family History  Problem  Relation Age of Onset  . Heart failure Mother   . Heart attack Mother   . Heart failure Father   . Heart attack Father   . Coronary artery  disease    . Sudden Cardiac Death Brother     in his 64's  . Heart attack Brother   . Cancer Brother     Social History:  reports that he quit smoking about 46 years ago. His smoking use included Cigarettes. He has a 7.50 pack-year smoking history. He quit smokeless tobacco use about 44 years ago. He reports that he drinks about 0.6 oz of alcohol per week . He reports that he does not use drugs.    Review of Systems    Lipid history: He is on Lipitor 80 mg, Usually followed by PCP or cardiologist Has history of CAD    Lab Results  Component Value Date   CHOL 105 01/28/2016   HDL 30 (L) 01/28/2016   LDLCALC 45 01/28/2016   TRIG 150 (H) 01/28/2016   CHOLHDL 3.5 01/28/2016           Last foot exam in 07/2015   Physical Examination:  BP (!) 124/56   Pulse 86   Temp 97.9 F (36.6 C)   Resp 16   Ht 5\' 6"  (1.676 m)   Wt 210 lb 6.4 oz (95.4 kg)   SpO2 95%   BMI 33.96 kg/m       ASSESSMENT:  Diabetes type 2, uncontrolled    See history of present illness for detailed discussion of current diabetes management, blood sugar patterns and problems identified  He appears to be doing relatively better with Humulin R compared to Humalog 50/50 Has not had as extreme high sugars as before and average blood sugar is better However still appears to be needing a higher dose of insulin, currently taking a total of 85 units Does need to check more readings postprandially and checking mostly before his meals Again has difficulty losing weight  PLAN:  Increase insulin as follows: 40 before breakfast, 30 before lunch and 35 before supper Start checking readings 2-3 hours after various meals especially after supper Call blood sugar readings for further adjustment in 2 weeks  Patient Instructions   40units at breakfast, 30 at lunch and 35 before supper More sugars at bedtime  Call in 2 weeks with sugar reading   [facility] 02/05/2016, 3:55 PM   Note: This office note was  prepared with Estate agent. Any transcriptional errors that result from this process are unintentional.

## 2016-02-07 DIAGNOSIS — R1310 Dysphagia, unspecified: Secondary | ICD-10-CM | POA: Diagnosis not present

## 2016-02-07 DIAGNOSIS — R490 Dysphonia: Secondary | ICD-10-CM | POA: Diagnosis not present

## 2016-02-07 DIAGNOSIS — J370 Chronic laryngitis: Secondary | ICD-10-CM | POA: Diagnosis not present

## 2016-02-09 ENCOUNTER — Encounter: Payer: Self-pay | Admitting: Internal Medicine

## 2016-02-14 ENCOUNTER — Other Ambulatory Visit: Payer: Self-pay | Admitting: Cardiology

## 2016-02-16 ENCOUNTER — Encounter: Payer: Self-pay | Admitting: Family Medicine

## 2016-02-18 ENCOUNTER — Other Ambulatory Visit: Payer: Self-pay | Admitting: Cardiology

## 2016-02-18 DIAGNOSIS — I5022 Chronic systolic (congestive) heart failure: Secondary | ICD-10-CM

## 2016-02-21 ENCOUNTER — Ambulatory Visit (HOSPITAL_COMMUNITY): Payer: Managed Care, Other (non HMO) | Attending: Cardiovascular Disease

## 2016-02-21 ENCOUNTER — Other Ambulatory Visit: Payer: Self-pay

## 2016-02-21 DIAGNOSIS — I340 Nonrheumatic mitral (valve) insufficiency: Secondary | ICD-10-CM | POA: Insufficient documentation

## 2016-02-21 DIAGNOSIS — I110 Hypertensive heart disease with heart failure: Secondary | ICD-10-CM | POA: Insufficient documentation

## 2016-02-21 DIAGNOSIS — I5022 Chronic systolic (congestive) heart failure: Secondary | ICD-10-CM

## 2016-02-21 DIAGNOSIS — E119 Type 2 diabetes mellitus without complications: Secondary | ICD-10-CM | POA: Insufficient documentation

## 2016-02-21 DIAGNOSIS — I4891 Unspecified atrial fibrillation: Secondary | ICD-10-CM | POA: Diagnosis not present

## 2016-02-21 DIAGNOSIS — I2510 Atherosclerotic heart disease of native coronary artery without angina pectoris: Secondary | ICD-10-CM | POA: Diagnosis not present

## 2016-02-28 ENCOUNTER — Telehealth: Payer: Self-pay | Admitting: Nurse Practitioner

## 2016-02-28 NOTE — Telephone Encounter (Signed)
New message   Pt son verbalized that he is calling for echo results

## 2016-02-29 ENCOUNTER — Telehealth: Payer: Self-pay | Admitting: Nurse Practitioner

## 2016-02-29 ENCOUNTER — Ambulatory Visit (INDEPENDENT_AMBULATORY_CARE_PROVIDER_SITE_OTHER): Payer: Managed Care, Other (non HMO)

## 2016-02-29 ENCOUNTER — Telehealth: Payer: Self-pay | Admitting: Cardiology

## 2016-02-29 DIAGNOSIS — Z95810 Presence of automatic (implantable) cardiac defibrillator: Secondary | ICD-10-CM | POA: Diagnosis not present

## 2016-02-29 DIAGNOSIS — I5022 Chronic systolic (congestive) heart failure: Secondary | ICD-10-CM | POA: Diagnosis not present

## 2016-02-29 NOTE — Telephone Encounter (Signed)
New message  Pt's wife calling for results of echo done on 11/6  Ok to leave msg

## 2016-02-29 NOTE — Telephone Encounter (Signed)
LMOVM reminding pt to send remote transmission.   

## 2016-02-29 NOTE — Telephone Encounter (Signed)
ECHO SENT TO PROVIDER FOR FEEDBACK THEN WILL CONTACT BACK

## 2016-02-29 NOTE — Telephone Encounter (Signed)
SPOKE TO PATIENT ABOUT RESULTS AND VERBALIZED UNDERSTANDING

## 2016-03-06 ENCOUNTER — Other Ambulatory Visit: Payer: Self-pay | Admitting: Cardiology

## 2016-03-06 ENCOUNTER — Telehealth (HOSPITAL_COMMUNITY): Payer: Self-pay | Admitting: *Deleted

## 2016-03-06 NOTE — Telephone Encounter (Signed)
Patient' Wife Khalik Hayley called asking for Echo results.    Spoke with [name] and explained that the Echo results needed to be read by the ordering provider at [facility].  Advised her to call them.  No further questions at this time.

## 2016-03-06 NOTE — Progress Notes (Signed)
EPIC Encounter for ICM Monitoring  Patient Name: [name] is a 74 y.o. male Date: 03/06/2016 Primary Care Physican: Mayra Neer, MD Primary Cardiologist:[place] Electrophysiologist: Allred Dry Weight:    208 lb  Bi-V Pacing:  98.7%         Heart Failure questions reviewed, pt asymptomatic   Thoracic impedance normal   Recommendations: No changes.  Reinforced low salt food choices and limiting fluid intake to < 2 liters per day. Encouraged to call for fluid symptoms.    Follow-up plan: ICM clinic phone appointment on 04/06/2016.  Copy of ICM check sent to device physician.   ICM trend: 03/02/2016       [name], RN 03/06/2016 8:59 AM

## 2016-03-06 NOTE — Telephone Encounter (Signed)
Patient given detailed instructions per Myocardial Perfusion Study Information Sheet for the test on 03/13/16 at 0730. Patient notified to arrive 15 minutes early and that it is imperative to arrive on time for appointment to keep from having the test rescheduled.  If you need to cancel or reschedule your appointment, please call the office within 24 hours of your appointment. Failure to do so may result in a cancellation of your appointment, and a $50 no show fee. Patient verbalized understanding.Courtni Balash, Ranae Palms

## 2016-03-13 ENCOUNTER — Ambulatory Visit (HOSPITAL_COMMUNITY): Payer: Managed Care, Other (non HMO) | Attending: Cardiovascular Disease

## 2016-03-13 ENCOUNTER — Other Ambulatory Visit (HOSPITAL_COMMUNITY): Payer: Self-pay | Admitting: *Deleted

## 2016-03-13 DIAGNOSIS — I2510 Atherosclerotic heart disease of native coronary artery without angina pectoris: Secondary | ICD-10-CM | POA: Insufficient documentation

## 2016-03-13 DIAGNOSIS — E785 Hyperlipidemia, unspecified: Secondary | ICD-10-CM | POA: Insufficient documentation

## 2016-03-13 DIAGNOSIS — I10 Essential (primary) hypertension: Secondary | ICD-10-CM | POA: Diagnosis not present

## 2016-03-13 DIAGNOSIS — I200 Unstable angina: Secondary | ICD-10-CM | POA: Diagnosis not present

## 2016-03-13 DIAGNOSIS — E119 Type 2 diabetes mellitus without complications: Secondary | ICD-10-CM | POA: Diagnosis not present

## 2016-03-13 DIAGNOSIS — I4891 Unspecified atrial fibrillation: Secondary | ICD-10-CM | POA: Insufficient documentation

## 2016-03-13 DIAGNOSIS — R079 Chest pain, unspecified: Secondary | ICD-10-CM | POA: Diagnosis not present

## 2016-03-13 DIAGNOSIS — I252 Old myocardial infarction: Secondary | ICD-10-CM | POA: Insufficient documentation

## 2016-03-13 MED ORDER — TECHNETIUM TC 99M TETROFOSMIN IV KIT
33.0000 | PACK | Freq: Once | INTRAVENOUS | Status: AC | PRN
  Administered 2016-03-13: 33 via INTRAVENOUS
  Filled 2016-03-13: qty 33

## 2016-03-14 ENCOUNTER — Telehealth (HOSPITAL_COMMUNITY): Payer: Self-pay | Admitting: *Deleted

## 2016-03-14 NOTE — Telephone Encounter (Signed)
Patient given detailed instructions per Myocardial Perfusion Study Information Sheet for the test on 03/17/16 at 0900. Patient notified to arrive 15 minutes early and that it is imperative to arrive on time for appointment to keep from having the test rescheduled.  If you need to cancel or reschedule your appointment, please call the office within 24 hours of your appointment. Failure to do so may result in a cancellation of your appointment, and a $50 no show fee. Patient verbalized understanding.Vestal Crandall, Ranae Palms

## 2016-03-17 ENCOUNTER — Ambulatory Visit (INDEPENDENT_AMBULATORY_CARE_PROVIDER_SITE_OTHER): Payer: Managed Care, Other (non HMO) | Admitting: Endocrinology

## 2016-03-17 ENCOUNTER — Encounter: Payer: Self-pay | Admitting: Endocrinology

## 2016-03-17 ENCOUNTER — Ambulatory Visit (HOSPITAL_COMMUNITY): Payer: Managed Care, Other (non HMO) | Attending: Cardiovascular Disease

## 2016-03-17 VITALS — BP 108/60 | HR 81 | Ht 70.0 in | Wt 205.0 lb

## 2016-03-17 DIAGNOSIS — E1165 Type 2 diabetes mellitus with hyperglycemia: Secondary | ICD-10-CM | POA: Diagnosis not present

## 2016-03-17 DIAGNOSIS — I200 Unstable angina: Secondary | ICD-10-CM | POA: Diagnosis not present

## 2016-03-17 DIAGNOSIS — Z794 Long term (current) use of insulin: Secondary | ICD-10-CM

## 2016-03-17 LAB — MYOCARDIAL PERFUSION IMAGING
CHL CUP NUCLEAR SRS: 28
CHL CUP NUCLEAR SSS: 27
LHR: 0.38
LV dias vol: 279 mL (ref 62–150)
LV sys vol: 224 mL
NUC STRESS TID: 1.01
Peak HR: 96 {beats}/min
Rest HR: 96 {beats}/min
SDS: 5

## 2016-03-17 MED ORDER — REGADENOSON 0.4 MG/5ML IV SOLN
0.4000 mg | Freq: Once | INTRAVENOUS | Status: AC
  Administered 2016-03-17: 0.4 mg via INTRAVENOUS

## 2016-03-17 MED ORDER — TECHNETIUM TC 99M TETROFOSMIN IV KIT
32.5000 | PACK | Freq: Once | INTRAVENOUS | Status: AC | PRN
  Administered 2016-03-17: 32.5 via INTRAVENOUS
  Filled 2016-03-17: qty 33

## 2016-03-17 NOTE — Patient Instructions (Addendum)
Take insulin before every meal regardless pf sugar level  35 units at breakfast , 25 at lunch if eating and 45 before supper No insulin at bedtime  Check blood sugars on waking up 4x per week   Also check blood sugars about 2 hours after a meal and do this after different meals by rotation  Recommended blood sugar levels on waking up is 90-130 and about 2 hours after meal is 130-160  Please bring your blood sugar monitor to each visit, thank you

## 2016-03-17 NOTE — Progress Notes (Signed)
Patient ID: [name], male   DOB: [birth year], 75 y.o.   MRN: [id number]           Reason for Appointment: Follow-up for Type 2 Diabetes  Referring physician: Brigitte Pulse  History of Present Illness:          Date of diagnosis of type 2 diabetes mellitus: 1997       Background history:  He was initially treated with oral agents and also at some point was given Victoza. Most of his diabetes treatment has been with insulin which he has taken since at least 2005 Although he had improved control with Victoza he stopped this as he started having some low sugars He was initially treated with 70/30 insulin but subsequently was given Apidra and Lantus He was taking about 90 units of Lantus daily in split doses and 35-40 units Apidra previously with each meal He thinks that about a year ago one of his physicians  In 2015 this was changed to  Humalog 50/50  Recent history:   INSULIN regimen currently: Humulin R U-500: 30 units at breakfast , 20 at lunch/ noon and 40 before supper  His A1c is recently better 8.2, previously higher  Current management, blood sugar patterns and problems identified:  His blood sugars have gone up since his last visit on an average but no labs are available to assess subjectively  He appears to be now inconsistent with his insulin regimen at home  Difficult to know exactly how he is taking his insulin as he may not take it with every meal and is giving conflicting answers about whether he takes the evening insulin before or after eating; most likely is not taking insulin when blood sugars are normal  Although previously he said he's eating a dinner at 9 PM he is now says it is at 5-6 PM.  Most of his blood sugars are after 8 PM at night  Fasting blood sugars usually not being checked in the mornings  He does some blood sugars around midday and not clear if this is truly fasting as he has had readings as high as 338  Blood sugars after 9 PM are mostly high  although occasionally will have good readings  Also not clear if he is consistent with diet  He appears not very motivated to improve his diabetes control  No hypoglycemia, today he says he is feeling weak but his blood sugar in the office is not low  Still taking metformin in the evening   Compliance with the medical regimen:  fair  Hypoglycemia:   none  Glucose monitoring:  up to twice a day         Glucometer:  One Touch ultra     Blood Glucose readings  from download:  Mean values apply above for all meters except median for One Touch  PRE-MEAL Mornings  Midday  6-9 PM  Bedtime Overall  Glucose range: 124-210  140-338  99-353  182-328    Mean/median:  231  200  246  229+/-61    Self-care: The diet that the patient has been following is: None .     Meal times: Breakfast:11 am-1 PM, Sometimes cereal or fruit.  Lunch:  not consistent  Dinner: 6 p.m.   Typical meal intake: Breakfast is cereal at 10 am but variable                Dietician visit, most recent: 2013  Exercise:Gardening, treadmill, Walking about 3 days a week  Weight  History:    Wt Readings from Last 3 Encounters:  03/17/16 205 lb (93 kg)  03/13/16 209 lb (94.8 kg)  02/04/16 210 lb 6.4 oz (95.4 kg)    Glycemic control:   Lab Results  Component Value Date   HGBA1C 8.2 01/05/2016   HGBA1C 9.2 (H) 10/04/2015   HGBA1C 8.9 (H) 01/05/2012   Lab Results  Component Value Date   LDLCALC 45 01/28/2016   CREATININE 1.31 (H) 01/28/2016     Last microalbumin/creatinine ratio 112 in 3/16     Medication List       Accurate as of 03/17/16 12:26 PM. Always use your most recent med list.          allopurinol 100 MG tablet Commonly known as:  ZYLOPRIM Take 100 mg by mouth 2 (two) times daily.   aspirin EC 81 MG tablet Take 81 mg by mouth daily.   atorvastatin 80 MG tablet Commonly known as:  LIPITOR TAKE 1 TABLET BY MOUTH DAILY AT 6:00 PM   carvedilol 25 MG tablet Commonly known as:   COREG Take 1 tablet (25 mg total) by mouth 2 (two) times daily with a meal.   clopidogrel 75 MG tablet Commonly known as:  PLAVIX Take 1 tablet (75 mg total) by mouth daily.   eplerenone 25 MG tablet Commonly known as:  INSPRA Take 1 tablet (25 mg total) by mouth every evening.   furosemide 40 MG tablet Commonly known as:  LASIX Take 40 mg by mouth 2 (two) times daily.   Insulin Lispro Prot & Lispro (50-50) 100 UNIT/ML Kwikpen Commonly known as:  HUMALOG MIX 50/50 KWIKPEN 15 units at breakfast and lunch, 30 units before supper.  Take insulin 10 minutes before eating   Insulin Pen Needle 32G X 4 MM Misc Commonly known as:  RELION PEN NEEDLES Use to inject insulin 3 times per day.   insulin regular human CONCENTRATED 500 UNIT/ML kwikpen Commonly known as:  HUMULIN R U-500 KWIKPEN 30 units before BFST, 20 at lunch and 35 before supper, take 30-60 minutes before each meal   lisinopril 5 MG tablet Commonly known as:  PRINIVIL,ZESTRIL TAKE ONE TABLET BY MOUTH ONCE DAILY   metFORMIN 1000 MG tablet Commonly known as:  GLUCOPHAGE Take 1 tablet (1,000 mg total) by mouth daily with supper.   nitroGLYCERIN 0.4 MG SL tablet Commonly known as:  NITROSTAT Place 1 tablet (0.4 mg total) under the tongue every 5 (five) minutes x 3 doses as needed. For chest pain.   ONE TOUCH ULTRA TEST test strip Generic drug:  glucose blood Use to check blood sugar 3 times per day.   ONETOUCH DELICA LANCETS 99991111 Misc Use to check blood sugar 3 times per day dx code E11.9   pantoprazole 40 MG tablet Commonly known as:  PROTONIX Take 1 tablet (40 mg total) by mouth daily.   RANEXA 1000 MG SR tablet Generic drug:  ranolazine Take 500 mg by mouth 2 (two) times daily.   venlafaxine XR 150 MG 24 hr capsule Commonly known as:  EFFEXOR-XR Take 150 mg by mouth at bedtime.   zolpidem 12.5 MG CR tablet Commonly known as:  AMBIEN CR Take 12.5 mg by mouth at bedtime as needed. For sleep        Allergies:  Allergies  Allergen Reactions  . Codeine Nausea And Vomiting  . Latex Rash    Specifies adhesive     Past  Medical History:  Diagnosis Date  . CAD (coronary artery disease)    s/p CABG 1997, PCI (BMS) of SVG to RCA 3/09, redo CABG 02/2011 with SVG to PDA, SVG to Lcx, s/p cath 2.2013 with ocluded SVT to left circ and patent SVG to RCA.  Cath 07/2013 Recent cath showed ischemic cardiomyopathy with LVEF less than 20%, with progressive LV dysfunction related to bypass graft failure, occlusion of the saphenous vein grafts placed in 2012 to the right coronary and to the circum  . Chronic systolic dysfunction of left ventricle    EF 30%  . Depression   . DJD (degenerative joint disease)   . DM (diabetes mellitus) ([place]) 02/14/2011  . GERD (gastroesophageal reflux disease)   . Gout   . HTN (hypertension) 02/14/2011  . Hyperlipemia   . Ischemic cardiomyopathy    s/p ICD Implantation by Dr Leonia Reeves  . Paroxysmal atrial fibrillation ([facility]) 10/22/2014   single episode of AF x 3 hours 48 minutes recorded on ICD, chads2vasc score is at least 5     Past Surgical History:  Procedure Laterality Date  . BI-VENTRICULAR IMPLANTABLE CARDIOVERTER DEFIBRILLATOR UPGRADE N/A 08/24/2011   Procedure: BI-VENTRICULAR IMPLANTABLE CARDIOVERTER DEFIBRILLATOR UPGRADE;  Surgeon: Evans Lance, MD;  Location: [facility] CATH LAB;  Service: Cardiovascular;  Laterality: N/A;  . CARDIAC CATHETERIZATION  08/03/2015   Procedure: Left Heart Cath and Cors/Grafts Angiography;  Surgeon: Peter M Martinique, MD;  Location: [place] CV LAB;  Service: Cardiovascular;;  . CARDIAC CATHETERIZATION N/A 08/06/2015   Procedure: Coronary Stent Intervention w/Impella;  Surgeon: Jettie Booze, MD;  Location: [place] CV LAB;  Service: Cardiovascular;  Laterality: N/A;  . CARDIAC CATHETERIZATION  08/06/2015   Procedure: Left Heart Cath;  Surgeon: Jettie Booze, MD;  Location: [place] CV LAB;  Service: Cardiovascular;;   . CARDIAC CATHETERIZATION  08/06/2015   Procedure: Coronary Balloon Angioplasty;  Surgeon: Jettie Booze, MD;  Location: [place] CV LAB;  Service: Cardiovascular;;  . CARDIAC DEFIBRILLATOR PLACEMENT  08/2004   initial placement, upgraded to [place] ICD by Dr Lovena Le 08/24/11 (MDT)  . CATARACT EXTRACTION W/ INTRAOCULAR LENS  IMPLANT, BILATERAL  2012  . CHOLECYSTECTOMY  01/05/2012   Procedure: LAPAROSCOPIC CHOLECYSTECTOMY WITH INTRAOPERATIVE CHOLANGIOGRAM;  Surgeon: Joyice Faster. Cornett, MD;  Location: [place];  Service: General;  Laterality: N/A;  laparoscopic cholecysectoym with intraoperative cholangiogram  . COLONOSCOPY WITH PROPOFOL N/A 11/18/2013   Procedure: COLONOSCOPY WITH PROPOFOL;  Surgeon: Garlan Fair, MD;  Location: [facility] ENDOSCOPY;  Service: Endoscopy;  Laterality: N/A;  . CORONARY ANGIOPLASTY WITH STENT PLACEMENT  06/2007   BMS to SVG to RCA  . CORONARY ARTERY BYPASS GRAFT  01/1996   CABG x 5 LIMA to LAD SVG to diag1,2,svg to om,svg to RCA  . CORONARY ARTERY BYPASS GRAFT  03/06/2011   CABG X2; Procedure: REDO CORONARY ARTERY BYPASS GRAFTING (CABG);  Surgeon: Grace Isaac, MD;  Location: [place];  Service: Open Heart Surgery;  Laterality: N/A;  times two grafts using right saphenous vein harvested endoscopically.  Marland Kitchen KNEE ARTHROTOMY  ~ 1978   RIGHT KNEE CARTILAGE REMOVED  . LEFT HEART CATHETERIZATION WITH CORONARY ANGIOGRAM N/A 06/06/2011   Procedure: LEFT HEART CATHETERIZATION WITH CORONARY ANGIOGRAM;  Surgeon: Sueanne Margarita, MD;  Location: [place] CATH LAB;  Service: Cardiovascular;  Laterality: N/A;  . LEFT HEART CATHETERIZATION WITH CORONARY/GRAFT ANGIOGRAM N/A 08/08/2013   Procedure: LEFT HEART CATHETERIZATION WITH Beatrix Fetters;  Surgeon: Sinclair Grooms, MD;  Location: [facility] CATH  LAB;  Service: Cardiovascular;  Laterality: N/A;  . LUMBAR [place] [facility]  2003  . RIGHT HEART CATHETERIZATION N/A 02/17/2014   Procedure: RIGHT HEART CATH;  Surgeon: Larey Dresser, MD;  Location:  [facility] CATH LAB;  Service: Cardiovascular;  Laterality: N/A;  . VENOGRAM N/A 06/08/2011   Procedure: VENOGRAM;  Surgeon: Thompson Grayer, MD;  Location: [facility] CATH LAB;  Service: Cardiovascular;  Laterality: N/A;    Family History  Problem Relation Age of Onset  . Heart failure Mother   . Heart attack Mother   . Heart failure Father   . Heart attack Father   . Coronary artery disease    . Sudden Cardiac Death Brother     in his 50's  . Heart attack Brother   . Cancer Brother     Social History:  reports that he quit smoking about 46 years ago. His smoking use included Cigarettes. He has a 7.50 pack-year smoking history. He quit smokeless tobacco use about 44 years ago. He reports that he drinks about 0.6 oz of alcohol per week . He reports that he does not use drugs.    Review of Systems    Lipid history: He is on Lipitor 80 mg, Usually followed by PCP or cardiologist Has history of CAD    Lab Results  Component Value Date   CHOL 105 01/28/2016   HDL 30 (L) 01/28/2016   LDLCALC 45 01/28/2016   TRIG 150 (H) 01/28/2016   CHOLHDL 3.5 01/28/2016           Last foot exam in 07/2015   Physical Examination:  BP 108/60   Pulse 81   Ht 5\' 10"  (1.778 m)   Wt 205 lb (93 kg)   BMI 29.41 kg/m       ASSESSMENT:  Diabetes type 2, uncontrolled    See history of present illness for detailed discussion of current diabetes management, blood sugar patterns and problems identified  Although he was doing better with Humulin R concentrated on the last visit his blood sugars are more erratic and overall high As discussed above this is likely related to inconsistent compliance with insulin doses, meals and timing of insulin He is not giving medical History regarding his insulin regimen also Again not clear if his sugars are being checked before or after eating as they are at variable times and usually checked not in the mornings Also he is taking less insulin than he was told to take on the  last visit  PLAN:  Increase insulin as follows: 35 before breakfast, 25 before lunch and 45 before supper Start checking readings 2-3 hours after various meals especially after supper Also check blood sugars on waking up He will keep a diary of his blood sugars and insulin doses and review compliance with dietitian and nurse educator    Patient Instructions  Take insulin before every meal regardless pf sugar level  35 units at breakfast , 25 at lunch if eating and 45 before supper No insulin at bedtime  Check blood sugars on waking up 4x per week   Also check blood sugars about 2 hours after a meal and do this after different meals by rotation  Recommended blood sugar levels on waking up is 90-130 and about 2 hours after meal is 130-160  Please bring your blood sugar monitor to each visit, thank you   Counseling time on subjects discussed above is over 50% of today's 25 minute visit  Kiko Ripp 03/17/2016, 12:26 PM  Note: This office note was prepared with Dragon voice recognition system technology. Any transcriptional errors that result from this process are unintentional.  

## 2016-03-26 ENCOUNTER — Emergency Department (HOSPITAL_COMMUNITY)
Admission: EM | Admit: 2016-03-26 | Discharge: 2016-03-27 | Disposition: A | Discharge status: Home / Self Care | Payer: Managed Care, Other (non HMO) | Attending: Emergency Medicine | Admitting: Emergency Medicine

## 2016-03-26 ENCOUNTER — Emergency Department (HOSPITAL_COMMUNITY): Payer: Managed Care, Other (non HMO)

## 2016-03-26 ENCOUNTER — Encounter (HOSPITAL_COMMUNITY): Payer: Self-pay

## 2016-03-26 DIAGNOSIS — Z91040 Latex allergy status: Secondary | ICD-10-CM | POA: Diagnosis not present

## 2016-03-26 DIAGNOSIS — S6991XA Unspecified injury of right wrist, hand and finger(s), initial encounter: Secondary | ICD-10-CM | POA: Diagnosis present

## 2016-03-26 DIAGNOSIS — Z794 Long term (current) use of insulin: Secondary | ICD-10-CM | POA: Diagnosis not present

## 2016-03-26 DIAGNOSIS — Z87891 Personal history of nicotine dependence: Secondary | ICD-10-CM | POA: Insufficient documentation

## 2016-03-26 DIAGNOSIS — Z23 Encounter for immunization: Secondary | ICD-10-CM | POA: Insufficient documentation

## 2016-03-26 DIAGNOSIS — W134XXA Fall from, out of or through window, initial encounter: Secondary | ICD-10-CM | POA: Diagnosis not present

## 2016-03-26 DIAGNOSIS — N183 Chronic kidney disease, stage 3 (moderate): Secondary | ICD-10-CM | POA: Diagnosis not present

## 2016-03-26 DIAGNOSIS — Z7982 Long term (current) use of aspirin: Secondary | ICD-10-CM | POA: Insufficient documentation

## 2016-03-26 DIAGNOSIS — Z95810 Presence of automatic (implantable) cardiac defibrillator: Secondary | ICD-10-CM | POA: Diagnosis not present

## 2016-03-26 DIAGNOSIS — Z951 Presence of aortocoronary bypass graft: Secondary | ICD-10-CM | POA: Diagnosis not present

## 2016-03-26 DIAGNOSIS — S61411A Laceration without foreign body of right hand, initial encounter: Secondary | ICD-10-CM

## 2016-03-26 DIAGNOSIS — I5022 Chronic systolic (congestive) heart failure: Secondary | ICD-10-CM | POA: Insufficient documentation

## 2016-03-26 DIAGNOSIS — Y929 Unspecified place or not applicable: Secondary | ICD-10-CM | POA: Diagnosis not present

## 2016-03-26 DIAGNOSIS — Y939 Activity, unspecified: Secondary | ICD-10-CM | POA: Diagnosis not present

## 2016-03-26 DIAGNOSIS — Y999 Unspecified external cause status: Secondary | ICD-10-CM | POA: Diagnosis not present

## 2016-03-26 DIAGNOSIS — S61214A Laceration without foreign body of right ring finger without damage to nail, initial encounter: Secondary | ICD-10-CM | POA: Insufficient documentation

## 2016-03-26 DIAGNOSIS — E1122 Type 2 diabetes mellitus with diabetic chronic kidney disease: Secondary | ICD-10-CM | POA: Diagnosis not present

## 2016-03-26 DIAGNOSIS — I2510 Atherosclerotic heart disease of native coronary artery without angina pectoris: Secondary | ICD-10-CM | POA: Diagnosis not present

## 2016-03-26 DIAGNOSIS — I252 Old myocardial infarction: Secondary | ICD-10-CM | POA: Diagnosis not present

## 2016-03-26 DIAGNOSIS — I130 Hypertensive heart and chronic kidney disease with heart failure and stage 1 through stage 4 chronic kidney disease, or unspecified chronic kidney disease: Secondary | ICD-10-CM | POA: Diagnosis not present

## 2016-03-26 MED ORDER — LIDOCAINE-EPINEPHRINE (PF) 2 %-1:200000 IJ SOLN
20.0000 mL | Freq: Once | INTRAMUSCULAR | Status: AC
  Administered 2016-03-26: 20 mL via INTRADERMAL
  Filled 2016-03-26: qty 20

## 2016-03-26 MED ORDER — LIDOCAINE-EPINEPHRINE 1 %-1:100000 IJ SOLN
20.0000 mL | Freq: Once | INTRAMUSCULAR | Status: AC
  Administered 2016-03-26: 20 mL via INTRADERMAL
  Filled 2016-03-26: qty 1

## 2016-03-26 MED ORDER — TETANUS-DIPHTH-ACELL PERTUSSIS 5-2.5-18.5 LF-MCG/0.5 IM SUSP
0.5000 mL | Freq: Once | INTRAMUSCULAR | Status: AC
  Administered 2016-03-26: 0.5 mL via INTRAMUSCULAR
  Filled 2016-03-26: qty 0.5

## 2016-03-26 NOTE — ED Provider Notes (Signed)
[facility] DEPT Provider Note   CSN: [id number] Arrival date & time: 03/26/16  2044     History   Chief Complaint Chief Complaint  Patient presents with  . Hand Injury    HPI [name] is a 75 y.o. male.  75 yo M with right hand lac after falling through glass display case.  Denies other injury.  Rapid bleeding, called 911   The history is provided by the patient.  Hand Injury   The incident occurred 1 to 2 hours ago. The incident occurred at home. The injury mechanism was a fall. The pain is present in the right hand. The quality of the pain is described as aching and burning. The pain is at a severity of 7/10. The pain is moderate. The pain has been constant since the incident. Pertinent negatives include no fever. He reports no foreign bodies present. The symptoms are aggravated by movement, use and palpation. He has tried nothing for the symptoms. The treatment provided no relief.    Past Medical History:  Diagnosis Date  . CAD (coronary artery disease)    s/p CABG 1997, PCI (BMS) of SVG to RCA 3/09, redo CABG 02/2011 with SVG to PDA, SVG to Lcx, s/p cath 2.2013 with ocluded SVT to left circ and patent SVG to RCA.  Cath 07/2013 Recent cath showed ischemic cardiomyopathy with LVEF less than 20%, with progressive LV dysfunction related to bypass graft failure, occlusion of the saphenous vein grafts placed in 2012 to the right coronary and to the circum  . Chronic systolic dysfunction of left ventricle    EF 30%  . Depression   . DJD (degenerative joint disease)   . DM (diabetes mellitus) ([place]) 02/14/2011  . GERD (gastroesophageal reflux disease)   . Gout   . HTN (hypertension) 02/14/2011  . Hyperlipemia   . Ischemic cardiomyopathy    s/p ICD Implantation by Dr Leonia Reeves  . Paroxysmal atrial fibrillation (HCC) 10/22/2014   single episode of AF x 3 hours 48 minutes recorded on ICD, chads2vasc score is at least 5     Patient Active Problem List   Diagnosis Date  Noted  . NSTEMI (non-ST elevated myocardial infarction) ([place])   . Unstable angina ([place])   . Chest pain with high risk of acute coronary syndrome 08/01/2015  . CKD (chronic kidney disease) stage 3, GFR 30-59 ml/min 06/14/2015  . Angina pectoris ([place]) 02/17/2015  . Paroxysmal atrial fibrillation ([place]) 01/11/2015  . Hypersomnia 04/09/2013  . Chronic systolic CHF (congestive heart failure) ([place]) 12/12/2012  . ICD-Medtronic 02/27/2012  . Chronic cholecystitis with calculus 01/02/2012  . Nausea vomiting and diarrhea 12/24/2011  . Dizziness 12/24/2011  . Preop cardiovascular exam 12/24/2011  . Acute cholecystitis 12/24/2011  . CAD (coronary artery disease) 02/14/2011  . HTN (hypertension) 02/14/2011  . DM (diabetes mellitus) ([place]) 02/14/2011  . Ischemic cardiomyopathy 10/14/2010  . Hyperlipemia 10/14/2010    Past Surgical History:  Procedure Laterality Date  . BI-VENTRICULAR IMPLANTABLE CARDIOVERTER DEFIBRILLATOR UPGRADE N/A 08/24/2011   Procedure: BI-VENTRICULAR IMPLANTABLE CARDIOVERTER DEFIBRILLATOR UPGRADE;  Surgeon: Evans Lance, MD;  Location: [facility] CATH LAB;  Service: Cardiovascular;  Laterality: N/A;  . CARDIAC CATHETERIZATION  08/03/2015   Procedure: Left Heart Cath and Cors/Grafts Angiography;  Surgeon: Peter M Martinique, MD;  Location: [facility] CV LAB;  Service: Cardiovascular;;  . CARDIAC CATHETERIZATION N/A 08/06/2015   Procedure: Coronary Stent Intervention w/Impella;  Surgeon: Jettie Booze, MD;  Location: [facility] CV LAB;  Service: Cardiovascular;  Laterality:  N/A;  . CARDIAC CATHETERIZATION  08/06/2015   Procedure: Left Heart Cath;  Surgeon: Jettie Booze, MD;  Location: [facility] CV LAB;  Service: Cardiovascular;;  . CARDIAC CATHETERIZATION  08/06/2015   Procedure: Coronary Balloon Angioplasty;  Surgeon: Jettie Booze, MD;  Location: [place] CV LAB;  Service: Cardiovascular;;  . CARDIAC DEFIBRILLATOR PLACEMENT  08/2004   initial placement, upgraded to  [place] ICD by Dr Lovena Le 08/24/11 (MDT)  . CATARACT EXTRACTION W/ INTRAOCULAR LENS  IMPLANT, BILATERAL  2012  . CHOLECYSTECTOMY  01/05/2012   Procedure: LAPAROSCOPIC CHOLECYSTECTOMY WITH INTRAOPERATIVE CHOLANGIOGRAM;  Surgeon: Joyice Faster. Cornett, MD;  Location: [facility];  Service: General;  Laterality: N/A;  laparoscopic cholecysectoym with intraoperative cholangiogram  . COLONOSCOPY WITH PROPOFOL N/A 11/18/2013   Procedure: COLONOSCOPY WITH PROPOFOL;  Surgeon: Garlan Fair, MD;  Location: [facility] ENDOSCOPY;  Service: Endoscopy;  Laterality: N/A;  . CORONARY ANGIOPLASTY WITH STENT PLACEMENT  06/2007   BMS to SVG to RCA  . CORONARY ARTERY BYPASS GRAFT  01/1996   CABG x 5 LIMA to LAD SVG to diag1,2,svg to om,svg to RCA  . CORONARY ARTERY BYPASS GRAFT  03/06/2011   CABG X2; Procedure: REDO CORONARY ARTERY BYPASS GRAFTING (CABG);  Surgeon: Grace Isaac, MD;  Location: [facility];  Service: Open Heart Surgery;  Laterality: N/A;  times two grafts using right saphenous vein harvested endoscopically.  Marland Kitchen KNEE ARTHROTOMY  ~ 1978   RIGHT KNEE CARTILAGE REMOVED  . LEFT HEART CATHETERIZATION WITH CORONARY ANGIOGRAM N/A 06/06/2011   Procedure: LEFT HEART CATHETERIZATION WITH CORONARY ANGIOGRAM;  Surgeon: Sueanne Margarita, MD;  Location: [facility] CATH LAB;  Service: Cardiovascular;  Laterality: N/A;  . LEFT HEART CATHETERIZATION WITH CORONARY/GRAFT ANGIOGRAM N/A 08/08/2013   Procedure: LEFT HEART CATHETERIZATION WITH Beatrix Fetters;  Surgeon: Sinclair Grooms, MD;  Location[facility] CATH LAB;  Service: Cardiovascular;  Laterality: N/A;  . LUMBAR [place] SURGERY  2003  . RIGHT HEART CATHETERIZATION N/A 02/17/2014   Procedure: RIGHT HEART CATH;  Surgeon: Larey Dresser, MD;  Location: [facility] CATH LAB;  Service: Cardiovascular;  Laterality: N/A;  . VENOGRAM N/A 06/08/2011   Procedure: VENOGRAM;  Surgeon: Thompson Grayer, MD;  Location: [facility] CATH LAB;  Service: Cardiovascular;  Laterality: N/A;       Home Medications    Prior to  Admission medications   Medication Sig Start Date End Date Taking? Authorizing Provider  allopurinol (ZYLOPRIM) 100 MG tablet Take 100 mg by mouth 2 (two) times daily.    Yes Historical Provider, MD  aspirin EC 81 MG tablet Take 81 mg by mouth daily.   Yes Historical Provider, MD  atorvastatin (LIPITOR) 80 MG tablet TAKE 1 TABLET BY MOUTH DAILY AT 6:00 PM 02/14/16  Yes Larey Dresser, MD  carvedilol (COREG) 25 MG tablet Take 1 tablet (25 mg total) by mouth 2 (two) times daily with a meal. 01/28/16  Yes Larey Dresser, MD  clopidogrel (PLAVIX) 75 MG tablet Take 1 tablet (75 mg total) by mouth daily. 02/03/14  Yes Thompson Grayer, MD  eplerenone (INSPRA) 25 MG tablet Take 1 tablet (25 mg total) by mouth every evening. 03/17/15  Yes Larey Dresser, MD  furosemide (LASIX) 40 MG tablet Take 40 mg by mouth 2 (two) times daily.   Yes Historical Provider, MD  Insulin Lispro Prot & Lispro (HUMALOG MIX 50/50 KWIKPEN) (50-50) 100 UNIT/ML Kwikpen 15 units at breakfast and lunch, 30 units before supper.  Take insulin 10 minutes before eating 11/05/15  Yes Elayne Snare, MD  insulin regular human CONCENTRATED (HUMULIN R U-500 KWIKPEN) 500 UNIT/ML kwikpen 30 units before BFST, 20 at lunch and 35 before supper, take 30-60 minutes before each meal 02/04/16  Yes Elayne Snare, MD  lisinopril (PRINIVIL,ZESTRIL) 5 MG tablet TAKE ONE TABLET BY MOUTH ONCE DAILY 01/06/16  Yes Larey Dresser, MD  nitroGLYCERIN (NITROSTAT) 0.4 MG SL tablet Place 1 tablet (0.4 mg total) under the tongue every 5 (five) minutes x 3 doses as needed. For chest pain. 01/03/16  Yes Larey Dresser, MD  pantoprazole (PROTONIX) 40 MG tablet Take 1 tablet (40 mg total) by mouth daily. 06/15/15  Yes Jolaine Artist, MD  ranolazine (RANEXA) 1000 MG SR tablet Take 500 mg by mouth 2 (two) times daily.   Yes Historical Provider, MD  venlafaxine (EFFEXOR-XR) 150 MG 24 hr capsule Take 150 mg by mouth at bedtime.    Yes Historical Provider, MD  zolpidem (AMBIEN CR)  12.5 MG CR tablet Take 12.5 mg by mouth at bedtime as needed. For sleep 04/04/11  Yes Historical Provider, MD  Insulin Pen Needle (RELION PEN NEEDLES) 32G X 4 MM MISC Use to inject insulin 3 times per day. 02/04/16   Elayne Snare, MD  metFORMIN (GLUCOPHAGE) 1000 MG tablet Take 1 tablet (1,000 mg total) by mouth daily with supper. Patient not taking: Reported on 03/26/2016 11/05/15   Elayne Snare, MD  ONE TOUCH ULTRA TEST test strip Use to check blood sugar 3 times per day. 02/04/16   Elayne Snare, MD  [facility] DELICA LANCETS [id number] MISC Use to check blood sugar 3 times per day dx code E11.9 02/04/16   Elayne Snare, MD    Family History Family History  Problem Relation Age of Onset  . Heart failure Mother   . Heart attack Mother   . Heart failure Father   . Heart attack Father   . Coronary artery disease    . Sudden Cardiac Death Brother     in his 73's  . Heart attack Brother   . Cancer Brother     Social History Social History  Substance Use Topics  . Smoking status: Former Smoker    Packs/day: 0.50    Years: 15.00    Types: Cigarettes    Quit date: 04/17/1969  . Smokeless tobacco: Former Systems developer    Quit date: 05/05/1971  . Alcohol use 0.6 oz/week    1 Cans of beer per week     Comment: occasional     Allergies   Codeine and Latex   Review of Systems Review of Systems  Constitutional: Negative for chills and fever.  HENT: Negative for congestion and facial swelling.   Eyes: Negative for discharge and visual disturbance.  Respiratory: Negative for shortness of breath.   Cardiovascular: Negative for chest pain and palpitations.  Gastrointestinal: Negative for abdominal pain, diarrhea and vomiting.  Musculoskeletal: Negative for arthralgias and myalgias.  Skin: Positive for wound. Negative for color change and rash.  Neurological: Negative for tremors, syncope and headaches.  Psychiatric/Behavioral: Negative for confusion and dysphoric mood.     Physical Exam Updated Vital  Signs BP 117/67 (BP Location: Left Arm)   Pulse 87   Temp 98.3 F (36.8 C) (Oral)   Resp 16   SpO2 96%   Physical Exam  Constitutional: He is oriented to person, place, and time. He appears well-developed and well-nourished.  HENT:  Head: Normocephalic and atraumatic.  Eyes: EOM are normal. Pupils are equal, round, and reactive  to light.  Neck: Normal range of motion. Neck supple. No JVD present.  Cardiovascular: Normal rate and regular rhythm.  Exam reveals no gallop and no friction rub.   No murmur heard. Pulmonary/Chest: No respiratory distress. He has no wheezes.  Abdominal: He exhibits no distension and no mass. There is no tenderness. There is no rebound and no guarding.  Musculoskeletal: Normal range of motion.       Hands: PMS intact to the right hand  Neurological: He is alert and oriented to person, place, and time.  Skin: No rash noted. No pallor.  Psychiatric: He has a normal mood and affect. His behavior is normal.  Nursing note and vitals reviewed.    ED Treatments / Results  Labs (all labs ordered are listed, but only abnormal results are displayed) Labs Reviewed - No data to display  EKG  EKG Interpretation None       Radiology Dg Hand Complete Right  Result Date: 03/26/2016 CLINICAL DATA:  75 year old male with right hand laceration to the thenar eminence. EXAM: RIGHT HAND - COMPLETE 3+ VIEW COMPARISON:  None. FINDINGS: There is no acute fracture or dislocation. The bones are osteopenic. There is osteoarthritic changes of the base of the thumb. 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 There is vascular atherosclerotic calcification. No soft tissue air or radiopaque foreign object. IMPRESSION: No acute fracture or dislocation. Osteopenia with degenerative changes of the base of the thumb and arthritic changes of the second MCP joint.  Electronically Signed   By: Anner Crete M.D.   On: 03/26/2016 23:07    Procedures .Marland KitchenLaceration Repair Date/Time: 03/26/2016 10:59 PM Performed by: Tyrone Nine Thelda Gagan Authorized by: Deno Etienne   Consent:    Consent obtained:  Verbal   Consent given by:  Patient   Risks discussed:  Infection and pain   Alternatives discussed:  No treatment Anesthesia (see MAR for exact dosages):    Anesthesia method:  Local infiltration   Local anesthetic:  Lidocaine 1% WITH epi Laceration details:    Location:  Hand   Hand location:  R palm   Length (cm):  7 Repair type:    Repair type:  Complex Exploration:    Limited defect created (wound extended): no     Hemostasis achieved with:  Epinephrine and direct pressure   Wound exploration: wound explored through full range of motion and entire depth of wound probed and visualized     Wound extent: areolar tissue violated, fascia violated and vascular damage     Wound extent: no foreign bodies/material noted, no nerve damage noted, no tendon damage noted and no underlying fracture noted     Contaminated: no   Treatment:    Area cleansed with:  Saline   Amount of cleaning:  Standard   Irrigation solution:  Sterile saline   Irrigation method:  Pressure wash   Visualized foreign bodies/material removed: no     Debridement:  Minimal   Undermining:  Minimal   Scar revision: no   Skin repair:    Repair method:  Sutures   Suture size:  4-0   Suture material:  Nylon   Suture technique:  Simple interrupted   Number of sutures:  15 Approximation:    Approximation:  Close   Vermilion border: well-aligned   Post-procedure details:    Dressing:  Bulky dressing   Patient tolerance of procedure:  Tolerated well,  no immediate complications .Marland KitchenLaceration Repair Date/Time: 03/27/2016 12:08 AM Performed by: Tyrone Nine Shterna Laramee Authorized by: Deno Etienne   Consent:    Consent obtained:  Verbal   Consent given by:  Patient   Risks discussed:  Infection, pain, poor  cosmetic result and poor wound healing   Alternatives discussed:  No treatment and delayed treatment Anesthesia (see MAR for exact dosages):    Anesthesia method:  Local infiltration   Local anesthetic:  Lidocaine 1% WITH epi Laceration details:    Location:  Finger   Finger location:  L long finger   Length (cm):  2.7 Repair type:    Repair type:  Intermediate Pre-procedure details:    Preparation:  Patient was prepped and draped in usual sterile fashion Exploration:    Hemostasis achieved with:  Direct pressure   Wound exploration: wound explored through full range of motion and entire depth of wound probed and visualized     Wound extent: vascular damage     Contaminated: no   Treatment:    Area cleansed with:  Saline   Amount of cleaning:  Standard   Irrigation solution:  Sterile saline   Irrigation method:  Pressure wash   Visualized foreign bodies/material removed: no   Skin repair:    Repair method:  Sutures   Suture size:  4-0   Suture material:  Nylon   Suture technique:  Simple interrupted   Number of sutures:  4 Approximation:    Approximation:  Close Post-procedure details:    Dressing:  Bulky dressing   Patient tolerance of procedure:  Tolerated well, no immediate complications   (including critical care time)  Medications Ordered in ED Medications  lidocaine-EPINEPHrine (XYLOCAINE W/EPI) 2 %-1:200000 (PF) injection 20 mL (not administered)  Tdap (BOOSTRIX) injection 0.5 mL (0.5 mLs Intramuscular Given 03/26/16 2355)  lidocaine-EPINEPHrine (XYLOCAINE W/EPI) 1 %-1:100000 (with pres) injection 20 mL (20 mLs Intradermal Given 03/26/16 2357)     Initial Impression / Assessment and Plan / ED Course  I have reviewed the triage vital signs and the nursing notes.  Pertinent labs & imaging results that were available during my care of the patient were reviewed by me and considered in my medical decision making (see chart for details).  Clinical Course     75  yo M With a chief complaint of a right hand injury. Patient had a large rapidly bleeding wound to the thenar eminence of the right hand. This was controlled with direct pressure and lidocaine with epinephrine. This was sutured closed after visual inspection. There is no pulsatile bleeding no foreign bodies. There are no exposed tendons.  Lacs repaired. Hand surgery follow up.   12:10 AM:  I have discussed the diagnosis/risks/treatment options with the patient and family and believe the pt to be eligible for discharge home to follow-up with Hand surgery. We also discussed returning to the ED immediately if new or worsening sx occur. We discussed the sx which are most concerning (e.g., sudden worsening pain, fever, drainage) that necessitate immediate return. Medications administered to the patient during their visit and any new prescriptions provided to the patient are listed below.  Medications given during this visit Medications  lidocaine-EPINEPHrine (XYLOCAINE W/EPI) 2 %-1:200000 (PF) injection 20 mL (not administered)  Tdap (BOOSTRIX) injection 0.5 mL (0.5 mLs Intramuscular Given 03/26/16 2355)  lidocaine-EPINEPHrine (XYLOCAINE W/EPI) 1 %-1:100000 (with pres) injection 20 mL (20 mLs Intradermal Given 03/26/16 2357)     The patient appears reasonably screen and/or stabilized for discharge and  I doubt any other medical condition or other [facility] requiring further screening, evaluation, or treatment in the ED at this time prior to discharge.    Final Clinical Impressions(s) / ED Diagnoses   Final diagnoses:  Laceration of right hand without foreign body, initial encounter    New Prescriptions New Prescriptions   No medications on file     Deno Etienne, DO 03/27/16 0010

## 2016-03-26 NOTE — ED Triage Notes (Signed)
Patient comes by EMS after losing balance and tripping and having hand go through glass window.  Patient is A&Ox4. Bleeding coming through gauze and semi controlled.  MD made aware.

## 2016-03-27 ENCOUNTER — Encounter: Payer: Managed Care, Other (non HMO) | Admitting: Nutrition

## 2016-03-27 NOTE — ED Notes (Signed)
Pt verbalized understanding discharge instructions and denies any further needs or questions at this time. VS stable, ambulatory and steady gait.   

## 2016-04-06 ENCOUNTER — Telehealth: Payer: Self-pay | Admitting: Cardiology

## 2016-04-06 NOTE — Telephone Encounter (Signed)
Spoke with pt and reminded pt of remote transmission that is due today. Pt verbalized understanding.   

## 2016-04-07 NOTE — Progress Notes (Signed)
No ICM remote transmission received on 04/06/2016.   Next ICM transmission scheduled for 04/27/2016.

## 2016-04-19 DIAGNOSIS — S61411D Laceration without foreign body of right hand, subsequent encounter: Secondary | ICD-10-CM | POA: Diagnosis not present

## 2016-04-24 DIAGNOSIS — D519 Vitamin B12 deficiency anemia, unspecified: Secondary | ICD-10-CM | POA: Diagnosis not present

## 2016-04-27 ENCOUNTER — Telehealth: Payer: Self-pay | Admitting: Cardiology

## 2016-04-27 NOTE — Telephone Encounter (Signed)
Spoke with pt and reminded pt of remote transmission that is due today. Pt verbalized understanding.   

## 2016-04-28 ENCOUNTER — Ambulatory Visit: Payer: Managed Care, Other (non HMO) | Admitting: Endocrinology

## 2016-05-01 NOTE — Progress Notes (Signed)
No ICM remote transmission received on 04/27/2016.   Next ICM transmission scheduled for 06/27/2016 due to patient has HF clinic appointment on 05/25/2016.Marland Kitchen

## 2016-05-03 DIAGNOSIS — S61411D Laceration without foreign body of right hand, subsequent encounter: Secondary | ICD-10-CM | POA: Diagnosis not present

## 2016-05-15 ENCOUNTER — Ambulatory Visit: Payer: Managed Care, Other (non HMO) | Admitting: Endocrinology

## 2016-05-17 DIAGNOSIS — S61411D Laceration without foreign body of right hand, subsequent encounter: Secondary | ICD-10-CM | POA: Diagnosis not present

## 2016-05-19 ENCOUNTER — Other Ambulatory Visit (HOSPITAL_COMMUNITY): Payer: Self-pay | Admitting: Cardiology

## 2016-05-19 ENCOUNTER — Other Ambulatory Visit: Payer: Self-pay | Admitting: Cardiology

## 2016-05-19 DIAGNOSIS — I5022 Chronic systolic (congestive) heart failure: Secondary | ICD-10-CM

## 2016-05-25 ENCOUNTER — Ambulatory Visit (HOSPITAL_COMMUNITY)
Admission: RE | Admit: 2016-05-25 | Discharge: 2016-05-25 | Disposition: A | Discharge status: Home / Self Care | Payer: Managed Care, Other (non HMO) | Source: Ambulatory Visit | Attending: Cardiology | Admitting: Cardiology

## 2016-05-25 VITALS — BP 122/64 | HR 77 | Wt 205.0 lb

## 2016-05-25 DIAGNOSIS — D519 Vitamin B12 deficiency anemia, unspecified: Secondary | ICD-10-CM | POA: Diagnosis not present

## 2016-05-25 DIAGNOSIS — Z951 Presence of aortocoronary bypass graft: Secondary | ICD-10-CM | POA: Insufficient documentation

## 2016-05-25 DIAGNOSIS — N183 Chronic kidney disease, stage 3 (moderate): Secondary | ICD-10-CM | POA: Insufficient documentation

## 2016-05-25 DIAGNOSIS — Z794 Long term (current) use of insulin: Secondary | ICD-10-CM | POA: Diagnosis not present

## 2016-05-25 DIAGNOSIS — I2510 Atherosclerotic heart disease of native coronary artery without angina pectoris: Secondary | ICD-10-CM | POA: Insufficient documentation

## 2016-05-25 DIAGNOSIS — Z7982 Long term (current) use of aspirin: Secondary | ICD-10-CM | POA: Diagnosis not present

## 2016-05-25 DIAGNOSIS — I5022 Chronic systolic (congestive) heart failure: Secondary | ICD-10-CM | POA: Diagnosis not present

## 2016-05-25 DIAGNOSIS — I255 Ischemic cardiomyopathy: Secondary | ICD-10-CM | POA: Diagnosis not present

## 2016-05-25 DIAGNOSIS — Z7902 Long term (current) use of antithrombotics/antiplatelets: Secondary | ICD-10-CM | POA: Diagnosis not present

## 2016-05-25 DIAGNOSIS — E785 Hyperlipidemia, unspecified: Secondary | ICD-10-CM | POA: Insufficient documentation

## 2016-05-25 DIAGNOSIS — Z79899 Other long term (current) drug therapy: Secondary | ICD-10-CM | POA: Insufficient documentation

## 2016-05-25 DIAGNOSIS — Z9889 Other specified postprocedural states: Secondary | ICD-10-CM | POA: Insufficient documentation

## 2016-05-25 DIAGNOSIS — E538 Deficiency of other specified B group vitamins: Secondary | ICD-10-CM

## 2016-05-25 DIAGNOSIS — E1122 Type 2 diabetes mellitus with diabetic chronic kidney disease: Secondary | ICD-10-CM | POA: Insufficient documentation

## 2016-05-25 LAB — BASIC METABOLIC PANEL
Anion gap: 12 (ref 5–15)
BUN: 20 mg/dL (ref 6–20)
CALCIUM: 8.3 mg/dL — AB (ref 8.9–10.3)
CHLORIDE: 98 mmol/L — AB (ref 101–111)
CO2: 27 mmol/L (ref 22–32)
CREATININE: 1.39 mg/dL — AB (ref 0.61–1.24)
GFR calc Af Amer: 56 mL/min — ABNORMAL LOW (ref >60)
GFR calc non Af Amer: 49 mL/min — ABNORMAL LOW (ref >60)
GLUCOSE: 319 mg/dL — AB (ref 65–99)
Potassium: 3.7 mmol/L (ref 3.5–5.1)
Sodium: 137 mmol/L (ref 135–145)

## 2016-05-25 LAB — VITAMIN B12: Vitamin B-12: 274 pg/mL (ref 180–914)

## 2016-05-25 LAB — BRAIN NATRIURETIC PEPTIDE: B Natriuretic Peptide: 225.2 pg/mL — ABNORMAL HIGH (ref 0.0–100.0)

## 2016-05-25 MED ORDER — LISINOPRIL 5 MG PO TABS: 5.0000 mg | ORAL_TABLET | Freq: Two times a day (BID) | ORAL | 3 refills | Status: DC

## 2016-05-25 MED ORDER — NITROGLYCERIN 0.4 MG SL SUBL
0.4000 mg | SUBLINGUAL_TABLET | SUBLINGUAL | 3 refills | Status: DC | PRN
Start: 2016-05-25 — End: 2017-07-21

## 2016-05-25 NOTE — Progress Notes (Signed)
Advanced Heart Failure Clinic Note   Patient ID: [name], male   DOB: 1941-01-06, 76 y.o.   MRN: [id number] PCP: Dr. Lynnda Child Cardiology: Dr Aundra Dubin  6 yo with history of CAD s/p CABG and redo CABG as well as ischemic cardiomyopathy with CRT-D device presents for CHF clinic evaluation.  He had a Cardiolite in the 4/15 showing lateral wall ischemia.  LHC at that time showed all his vein grafts from both CABG surgeries occluded.  The LIMA-LAD was patent and there was a 90% distal LM stenosis.  The lateral ischemia likely corresponded to LCx territory downstream from the LM stenosis.  PCI of the distal LM was thought to be high risk and characteristics of the lesion were not favorable for PCI.  Last echo showed EF 25-30% with moderate RV systolic dysfunction.  He had RHC in 11/15 with normal filling pressures and relatively preserved cardiac index (2.55).    In 4/17, he was admitted with chest pain concerning for unstable angina.  Angiography showed 85% distal LM with 90% ostial LCx stenosis.  LIMA was patent but proximal LAD, proximal RCA, and all SVGs were totally occluded. High had successful DES to LM and PTCA to ostial LCx with Impella support.  [name] was stopped and he was put back on low dose lisinopril due to symptomatic hypotension.  He had Cardiolite in 11/17 with scar, no ischemia.   He presents today for followup.  No dypsnea with exertion, active outside when weather is ok.  Has some generalized fatigue.  No problems with a flight of steps.  No orthopnea/PND.  No BRBPR/melena.  He has used NTG x 1 since last appointment, pain was atypical and occurred after a meal.  BP stable, no lightheadedness.  Weight down 4 lbs.   Labs (9/15): LDL particle number 816, LDL 57, LFTs normal Labs (10/15): K 4.4, creatinine 1.4 Labs (11/15): K 4.3, creatinine 1.36 Labs (12/15): K 4.8, creatinine 1.34 Labs (3/16): K 4, creatinine 1.38, LDL 46, HDl 35 Labs (7/16): K 4, creatinine 1.39, BNP 211 Labs  (03/03/15): K 4.1, creatinine 1.47, BNP 227, LDL 80, HDL 32 Labs (12/16): K 4.6, creatinine 1.12, LDL 68, HDL 34 Labs (4/17): K 3.4, creatinine 1.15 Labs (6/17): K 3.8, creatinine 1.67 Labs (7/17): K 4, creatinine 1.35 Labs (10/17): K 3.9, creatinine 1.31, LDL 45, HDL 30  PMH: 1. Gout 2. Hyperlipidemia 3. CAD: CABG 1997 and redo 11/12.   - LHC (4/15) with totally occluded LAD, totally occluded RCA, 80% distal LM, 50% mLCx, 2 SVG-RCA grafts totally occluded, 2 SVG-OM grafts totally occluded, patent LIMA-LAD.  Cardiolite prior to 4/15 cath showed lateral wall ischemia (LCx territory).  PCI to distal LM would be a high risk procedure.   - Cardiolite (8/16) with EF 20%, severe scar in RCA and probably LCx territory, minimal peri-infarct ischemia.   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  - Unstable angina 4/17: 85% distal LM with 90% ostial LCx stenosis.  LIMA was patent but proximal LAD, proximal RCA, and all SVGs were totally occluded. He had successful DES to LM and PTCA to ostial LCx with Impella support  - Cardiolite (11/17): EF 20%, prior MI, no ischemia.  4. Ischemic Cardiomyopathy: Medtronic CRT-D device.   - Echo (6/15) with EF 25-30%, moderate LV dilation, inferior and inferolateral akinesis, moderately decreased RV systolic function, mild MR.   - RHC (11/15) with mean RA 5, PA 23/6, mean PCWP 9, CI 2.55.  -  Echo (4/17): EF 25-30% with mild MR.  5. H/o cholecystectomy 6. OA 7. Depression 8. Type II diabetes 9. GERD 10. CKD  SH: Married, prior smoker (many years ago), lives in [place].    FH: CAD  ROS: All systems reviewed and negative except as per HPI.   Current Outpatient Prescriptions  Medication Sig Dispense Refill  . allopurinol (ZYLOPRIM) 100 MG tablet Take 100 mg by mouth 2 (two) times daily.     Marland Kitchen aspirin EC 81 MG tablet Take 81 mg by mouth daily.    Marland Kitchen atorvastatin (LIPITOR) 80 MG tablet TAKE 1 TABLET BY MOUTH DAILY AT 6:00 PM  90 tablet 0  . carvedilol (COREG) 25 MG tablet Take 1 tablet (25 mg total) by mouth 2 (two) times daily with a meal. 60 tablet 3  . clopidogrel (PLAVIX) 75 MG tablet Take 1 tablet (75 mg total) by mouth daily. 90 tablet 3  . eplerenone (INSPRA) 25 MG tablet Take 1 tablet (25 mg total) by mouth every evening. 30 tablet 6  . furosemide (LASIX) 40 MG tablet Take 1 tablet (40 mg total) by mouth 2 (two) times daily. 180 tablet 2  . Insulin Lispro Prot & Lispro (HUMALOG MIX 50/50 KWIKPEN) (50-50) 100 UNIT/ML Kwikpen 15 units at breakfast and lunch, 30 units before supper.  Take insulin 10 minutes before eating 15 mL 0  . Insulin Pen Needle (RELION PEN NEEDLES) 32G X 4 MM MISC Use to inject insulin 3 times per day. 150 each 2  . insulin regular human CONCENTRATED (HUMULIN R U-500 KWIKPEN) 500 UNIT/ML kwikpen 30 units before BFST, 20 at lunch and 35 before supper, take 30-60 minutes before each meal 2 pen 3  . lisinopril (PRINIVIL,ZESTRIL) 5 MG tablet Take 1 tablet (5 mg total) by mouth 2 (two) times daily. 60 tablet 3  . metFORMIN (GLUCOPHAGE) 1000 MG tablet Take 1 tablet (1,000 mg total) by mouth daily with supper. 30 tablet 3  . nitroGLYCERIN (NITROSTAT) 0.4 MG SL tablet Place 1 tablet (0.4 mg total) under the tongue every 5 (five) minutes x 3 doses as needed. For chest pain. 25 tablet 3  . ONE TOUCH ULTRA TEST test strip Use to check blood sugar 3 times per day. 150 each 3  . ONETOUCH DELICA LANCETS 99991111 MISC Use to check blood sugar 3 times per day dx code E11.9 100 each 3  . pantoprazole (PROTONIX) 40 MG tablet Take 1 tablet (40 mg total) by mouth daily. 30 tablet 11  . ranolazine (RANEXA) 1000 MG SR tablet Take 500 mg by mouth 2 (two) times daily.    Marland Kitchen venlafaxine (EFFEXOR-XR) 150 MG 24 hr capsule Take 150 mg by mouth at bedtime.     Marland Kitchen zolpidem (AMBIEN CR) 12.5 MG CR tablet Take 12.5 mg by mouth at bedtime as needed. For sleep     No current facility-administered medications for this encounter.     BP 122/64 (BP Location: Right Arm, Patient Position: Sitting, Cuff Size: Normal)   Pulse 77   Wt 205 lb (93 kg)   SpO2 93%   BMI 29.41 kg/m    Wt Readings from Last 3 Encounters:  05/25/16 205 lb (93 kg)  03/17/16 205 lb (93 kg)  03/13/16 209 lb (94.8 kg)     General: NAD Neck: JVP flat, no thyromegaly or nodule noted Lungs: CTAB, normal effort CV: Nondisplaced PMI.  Heart regular S1/S2, no S3/S4, no murmur.  No carotid bruit.  Normal pedal pulses.  Abdomen: Soft,  NT, ND, no HSM.  Skin: Intact without lesions or rashes.  Neurologic: Alert and oriented x 3.  Psych: Normal affect. Extremities: No clubbing or cyanosis. No edema.   Assessment/Plan: 1. Chronic systolic CHF: NYHA class II symptoms, stable.  EF 25-30% in 4/17 (stable). He has a Medtronic CRT-D device.  Volume status stable - Continue eplerenone 25 mg daily - BP did not tolerate Entresto.  Increase lisinopril to 5 mg bid.  BMET/BP today and again in 10 days.    - Continue Lasix 40 mg bid.   - Continue Coreg 25 mg bid. 2. CAD: s/p CABG and redo. Had DES to distal LM, angioplasty to ostial LCx in 4/17.  Cardiolite 11/17 with scar, no ischemia.  Rare atypical chest pain.  - Continue ASA 81, Plavix, statin.  - Continue Imdur, Coreg, and ranolazine 500 mg bid.  3. CKD stage II-III: BMET today. 4. Hyperlipidemia: Good lipids 10/17.     Followup in 3 months.   [name] 05/25/2016

## 2016-05-25 NOTE — Patient Instructions (Signed)
Increase Lisinopril to 5 mg Twice daily   Labs today  Labs in 10 days in [place] recommends that you schedule a follow-up appointment in: 3 months

## 2016-05-31 ENCOUNTER — Other Ambulatory Visit: Payer: Self-pay | Admitting: Cardiology

## 2016-06-01 NOTE — Addendum Note (Signed)
Encounter addended by: Shuntell Foody S Sharonlee Nine, MD on: 06/01/2016  7:37 PM<BR>    Actions taken: LOS modified

## 2016-06-14 ENCOUNTER — Other Ambulatory Visit (HOSPITAL_COMMUNITY)
Admission: RE | Admit: 2016-06-14 | Discharge: 2016-06-14 | Disposition: A | Discharge status: Home / Self Care | Payer: Managed Care, Other (non HMO) | Source: Ambulatory Visit | Attending: Cardiology | Admitting: Cardiology

## 2016-06-14 DIAGNOSIS — I5022 Chronic systolic (congestive) heart failure: Secondary | ICD-10-CM | POA: Diagnosis not present

## 2016-06-14 LAB — BASIC METABOLIC PANEL
ANION GAP: 8 (ref 5–15)
BUN: 26 mg/dL — AB (ref 6–20)
CALCIUM: 8.8 mg/dL — AB (ref 8.9–10.3)
CO2: 27 mmol/L (ref 22–32)
Chloride: 96 mmol/L — ABNORMAL LOW (ref 101–111)
Creatinine, Ser: 1.43 mg/dL — ABNORMAL HIGH (ref 0.61–1.24)
GFR calc Af Amer: 55 mL/min — ABNORMAL LOW (ref >60)
GFR, EST NON AFRICAN AMERICAN: 47 mL/min — AB (ref >60)
GLUCOSE: 345 mg/dL — AB (ref 65–99)
Potassium: 4 mmol/L (ref 3.5–5.1)
SODIUM: 131 mmol/L — AB (ref 135–145)

## 2016-06-22 DIAGNOSIS — D519 Vitamin B12 deficiency anemia, unspecified: Secondary | ICD-10-CM | POA: Diagnosis not present

## 2016-06-27 ENCOUNTER — Telehealth: Payer: Self-pay | Admitting: Cardiology

## 2016-06-27 ENCOUNTER — Ambulatory Visit (INDEPENDENT_AMBULATORY_CARE_PROVIDER_SITE_OTHER): Payer: Managed Care, Other (non HMO)

## 2016-06-27 DIAGNOSIS — I5022 Chronic systolic (congestive) heart failure: Secondary | ICD-10-CM

## 2016-06-27 DIAGNOSIS — Z95810 Presence of automatic (implantable) cardiac defibrillator: Secondary | ICD-10-CM

## 2016-06-27 NOTE — Telephone Encounter (Signed)
Spoke with pt and reminded pt of remote transmission that is due today. Pt verbalized understanding.   

## 2016-06-27 NOTE — Progress Notes (Signed)
EPIC Encounter for ICM Monitoring  Patient Name: [name] is a 76 y.o. male Date: 06/27/2016 Primary Care Physican: Mayra Neer, MD Primary Cardiologist:[place] Electrophysiologist: Allred Dry Weight:unknown Bi-V Pacing: 96%           Transmission reviewed.    Thoracic impedance normal since 06/18/2016.  Prescribed dosage: Furosemide 40 mg 1 tablet (40 mg total) by mouth 2 (two) times daily.  Eplerenone 25 mg 1 tablet daily  Recommendations: None  Follow-up plan: ICM clinic phone appointment on 07/31/2016.  Copy of ICM check sent to device physician.   3 month ICM trend: 06/27/2016   1 Year ICM trend:      [name], RN 06/27/2016 4:51 PM

## 2016-07-03 ENCOUNTER — Other Ambulatory Visit (HOSPITAL_COMMUNITY): Payer: Self-pay | Admitting: Internal Medicine

## 2016-07-04 ENCOUNTER — Other Ambulatory Visit (HOSPITAL_COMMUNITY): Payer: Self-pay | Admitting: Cardiology

## 2016-07-10 ENCOUNTER — Telehealth: Payer: Self-pay | Admitting: Internal Medicine

## 2016-07-10 ENCOUNTER — Other Ambulatory Visit (HOSPITAL_COMMUNITY): Payer: Self-pay | Admitting: Internal Medicine

## 2016-07-10 NOTE — Telephone Encounter (Signed)
Spoke with patient and reviewed remote transmission from 3/13. Answered questions and confirmed upcoming appointment for 4/16.

## 2016-07-10 NOTE — Telephone Encounter (Signed)
Follow Up:   Pt wants to know if you received his transmission from 06-30-16,if so was it alright?

## 2016-07-12 ENCOUNTER — Telehealth (HOSPITAL_COMMUNITY): Payer: Self-pay | Admitting: *Deleted

## 2016-07-12 NOTE — Telephone Encounter (Signed)
Patient's wife called in asking for an appt with Dr. Aundra Dubin to discuss stopping her husbands Ambien mediation that he has been on for at least 10 years. .  I called her back and asked if this was something that she could discuss with the ordering physician but she stated she would rather talking with Dr. Aundra Dubin.  She did not discuss any details on why she wanted him to stop medication.  I asked her if she could discuss her concerns at his next appointment on May 15th and she said that would be fine.  She also asked me to make a note for Dr. Aundra Dubin to talk with her in private at the time of appt.  No further questions at this time.

## 2016-07-24 DIAGNOSIS — D519 Vitamin B12 deficiency anemia, unspecified: Secondary | ICD-10-CM | POA: Diagnosis not present

## 2016-07-31 ENCOUNTER — Ambulatory Visit (INDEPENDENT_AMBULATORY_CARE_PROVIDER_SITE_OTHER): Payer: Managed Care, Other (non HMO)

## 2016-07-31 ENCOUNTER — Telehealth: Payer: Self-pay

## 2016-07-31 ENCOUNTER — Ambulatory Visit (INDEPENDENT_AMBULATORY_CARE_PROVIDER_SITE_OTHER): Payer: Managed Care, Other (non HMO) | Admitting: *Deleted

## 2016-07-31 DIAGNOSIS — I5022 Chronic systolic (congestive) heart failure: Secondary | ICD-10-CM | POA: Diagnosis not present

## 2016-07-31 DIAGNOSIS — Z95810 Presence of automatic (implantable) cardiac defibrillator: Secondary | ICD-10-CM | POA: Diagnosis not present

## 2016-07-31 DIAGNOSIS — I255 Ischemic cardiomyopathy: Secondary | ICD-10-CM

## 2016-07-31 NOTE — Telephone Encounter (Signed)
Remote ICM transmission received.  Attempted patient call on cell and voice mail is not set up and attempted home phone but line is busy.

## 2016-07-31 NOTE — Progress Notes (Signed)
EPIC Encounter for ICM Monitoring  Patient Name: [name] is a 76 y.o. male Date: 07/31/2016 Primary Care Physican: Mayra Neer, MD Primary Cardiologist:[place] Electrophysiologist: Allred Dry Weight:202 lbs Bi-V Pacing:  98%  Since 27-Jun-2016: Time in AT/AF <0.1 hr/day (<0.1%) Longest AT/AF 120 seconds       Heart Failure questions reviewed, pt symptomatic with swelling in feet that comes and goes.   Thoracic impedance abnormal suggesting fluid accumulation since 06/30/2016.  Prescribed and confirmed dosages: Furosemide 40 mg 1 tablet (40 mg total) by mouth 2 (two) times daily.  Eplerenone 25 mg 1 tablet daily   Labs: 06/14/2016 Creatinine 1.43, BUN 26, Potassium 4.0, Sodium 131, EGFR 47-55 05/25/2016 Creatinine 1.39, BUN 20, Potassium 3.7, Sodium 137, EGFR 49-56  01/28/2016 Creatinine 1.31, BUN 19, Potassium 3.9, Sodium 138, EGFR 52->60   Recommendations:  Patient thinks he is drinking too much fluids and advised to limit to 2 liters a day.  He does not follow low salt diet.  He says he does not salt his foods and explained he should review food labels to determine how much salt he is eating a day which should be limited to 2000 mg daily.   Encouraged to call for fluid symptoms.  Follow-up plan: ICM clinic phone appointment on 08/08/2016 to recheck fluid levels.  Office appointment scheduled on 08/29/2016 with HF clinic.  Copy of ICM check sent to Dr Aundra Dubin and Dr Rayann Heman for review and recommendations.  Advised will call him back if any recommendations.    3 month ICM trend: 07/31/2016   1 Year ICM trend:      [name], RN 07/31/2016 8:23 AM

## 2016-07-31 NOTE — Progress Notes (Signed)
Remote ICD transmission.   

## 2016-08-01 LAB — CUP PACEART REMOTE DEVICE CHECK
Brady Statistic AP VP Percent: 0.68 %
Brady Statistic AP VS Percent: 0.01 %
Brady Statistic AS VP Percent: 98.82 %
Brady Statistic AS VS Percent: 0.49 %
Brady Statistic RV Percent Paced: 98.04 %
Date Time Interrogation Session: 20180416062707
HIGH POWER IMPEDANCE MEASURED VALUE: 418 Ohm
HighPow Impedance: 40 Ohm
HighPow Impedance: 51 Ohm
Implantable Lead Implant Date: 20111003
Implantable Lead Location: 753858
Implantable Lead Location: 753859
Implantable Lead Location: 753860
Lead Channel Impedance Value: 361 Ohm
Lead Channel Impedance Value: 399 Ohm
Lead Channel Impedance Value: 513 Ohm
Lead Channel Impedance Value: 627 Ohm
Lead Channel Pacing Threshold Pulse Width: 0.4 ms
Lead Channel Sensing Intrinsic Amplitude: 1.25 mV
Lead Channel Sensing Intrinsic Amplitude: 1.25 mV
Lead Channel Setting Pacing Amplitude: 3 V
Lead Channel Setting Pacing Pulse Width: 0.4 ms
MDC IDC LEAD IMPLANT DT: 20111003
MDC IDC LEAD IMPLANT DT: 20130509
MDC IDC MSMT BATTERY VOLTAGE: 2.89 V
MDC IDC MSMT LEADCHNL LV PACING THRESHOLD AMPLITUDE: 0.875 V
MDC IDC MSMT LEADCHNL LV PACING THRESHOLD PULSEWIDTH: 0.4 ms
MDC IDC MSMT LEADCHNL RA IMPEDANCE VALUE: 418 Ohm
MDC IDC MSMT LEADCHNL RA PACING THRESHOLD AMPLITUDE: 1.875 V
MDC IDC MSMT LEADCHNL RV PACING THRESHOLD AMPLITUDE: 0.625 V
MDC IDC MSMT LEADCHNL RV PACING THRESHOLD PULSEWIDTH: 0.4 ms
MDC IDC MSMT LEADCHNL RV SENSING INTR AMPL: 30 mV
MDC IDC MSMT LEADCHNL RV SENSING INTR AMPL: 30 mV
MDC IDC PG IMPLANT DT: 20130509
MDC IDC SET LEADCHNL LV PACING AMPLITUDE: 2 V
MDC IDC SET LEADCHNL LV PACING PULSEWIDTH: 0.4 ms
MDC IDC SET LEADCHNL RV PACING AMPLITUDE: 2.5 V
MDC IDC SET LEADCHNL RV SENSING SENSITIVITY: 0.3 mV
MDC IDC STAT BRADY RA PERCENT PACED: 0.69 %

## 2016-08-01 NOTE — Telephone Encounter (Signed)
Spoke with patient, see ICM note.

## 2016-08-02 ENCOUNTER — Encounter: Payer: Self-pay | Admitting: Cardiology

## 2016-08-04 NOTE — Progress Notes (Signed)
Take Lasix 60 mg bid x 2 days then back to 40 mg bid after that.

## 2016-08-08 ENCOUNTER — Ambulatory Visit (INDEPENDENT_AMBULATORY_CARE_PROVIDER_SITE_OTHER): Payer: Self-pay

## 2016-08-08 DIAGNOSIS — I5022 Chronic systolic (congestive) heart failure: Secondary | ICD-10-CM

## 2016-08-08 DIAGNOSIS — Z95810 Presence of automatic (implantable) cardiac defibrillator: Secondary | ICD-10-CM

## 2016-08-09 ENCOUNTER — Telehealth: Payer: Self-pay | Admitting: Cardiology

## 2016-08-09 NOTE — Telephone Encounter (Signed)
Attempted to confirm remote transmission with pt. No answer and was unable to leave a message.   

## 2016-08-10 NOTE — Progress Notes (Signed)
EPIC Encounter for ICM Monitoring  Patient Name: [name] is a 76 y.o. male Date: 08/10/2016 Primary Care Physican: Mayra Neer, MD Primary Cardiologist:[place] Electrophysiologist: Allred Dry Weight:Last known weight 202 lbs Bi-V Pacing:  97.3%         Heart Failure questions reviewed, pt asymptomatic and could tell he lost the fluid.   Thoracic impedance returned to normal after increased Furosemide x 2 days.   Prescribed and confirmed dosages: Furosemide 40 mg 1 tablet (40 mg total) by mouth 2 (two) times daily. Eplerenone 25 mg 1 tablet daily   Labs: 06/14/2016 Creatinine 1.43, BUN 26, Potassium 4.0, Sodium 131, EGFR 47-55 05/25/2016 Creatinine 1.39, BUN 20, Potassium 3.7, Sodium 137, EGFR 49-56  01/28/2016 Creatinine 1.31, BUN 19, Potassium 3.9, Sodium 138, EGFR 52->60   Recommendations: No changes. Discussed to limit salt intake to 2000 mg/day and fluid intake to < 2 liters/day.  Encouraged to call for fluid symptoms.  Follow-up plan: ICM clinic phone appointment on 09/01/2016.  Office appointment scheduled on 08/29/2016 with HF clinic.  Copy of ICM check sent to device physician.   3 month ICM trend: 08/09/2016   1 Year ICM trend:      [name], RN 08/10/2016 8:15 AM

## 2016-08-26 DIAGNOSIS — Z7982 Long term (current) use of aspirin: Secondary | ICD-10-CM | POA: Diagnosis not present

## 2016-08-26 DIAGNOSIS — S0990XA Unspecified injury of head, initial encounter: Secondary | ICD-10-CM | POA: Diagnosis not present

## 2016-08-26 DIAGNOSIS — S01111A Laceration without foreign body of right eyelid and periocular area, initial encounter: Secondary | ICD-10-CM | POA: Diagnosis not present

## 2016-08-26 DIAGNOSIS — S0993XA Unspecified injury of face, initial encounter: Secondary | ICD-10-CM | POA: Diagnosis not present

## 2016-08-26 DIAGNOSIS — W19XXXA Unspecified fall, initial encounter: Secondary | ICD-10-CM | POA: Diagnosis not present

## 2016-08-26 DIAGNOSIS — S0003XA Contusion of scalp, initial encounter: Secondary | ICD-10-CM | POA: Diagnosis not present

## 2016-08-26 DIAGNOSIS — S0181XA Laceration without foreign body of other part of head, initial encounter: Secondary | ICD-10-CM | POA: Diagnosis not present

## 2016-08-26 DIAGNOSIS — W06XXXA Fall from bed, initial encounter: Secondary | ICD-10-CM | POA: Diagnosis not present

## 2016-08-26 DIAGNOSIS — Z885 Allergy status to narcotic agent status: Secondary | ICD-10-CM | POA: Diagnosis not present

## 2016-08-26 DIAGNOSIS — Z91040 Latex allergy status: Secondary | ICD-10-CM | POA: Diagnosis not present

## 2016-08-29 ENCOUNTER — Ambulatory Visit (HOSPITAL_COMMUNITY)
Admission: RE | Admit: 2016-08-29 | Discharge: 2016-08-29 | Disposition: A | Discharge status: Home / Self Care | Payer: Managed Care, Other (non HMO) | Source: Ambulatory Visit | Attending: Cardiology | Admitting: Cardiology

## 2016-08-29 ENCOUNTER — Encounter (HOSPITAL_COMMUNITY): Payer: Self-pay

## 2016-08-29 ENCOUNTER — Telehealth (HOSPITAL_COMMUNITY): Payer: Self-pay | Admitting: *Deleted

## 2016-08-29 VITALS — BP 132/70 | HR 69 | Wt 206.5 lb

## 2016-08-29 DIAGNOSIS — Z794 Long term (current) use of insulin: Secondary | ICD-10-CM | POA: Insufficient documentation

## 2016-08-29 DIAGNOSIS — Z7902 Long term (current) use of antithrombotics/antiplatelets: Secondary | ICD-10-CM | POA: Insufficient documentation

## 2016-08-29 DIAGNOSIS — F329 Major depressive disorder, single episode, unspecified: Secondary | ICD-10-CM | POA: Diagnosis not present

## 2016-08-29 DIAGNOSIS — G4700 Insomnia, unspecified: Secondary | ICD-10-CM | POA: Insufficient documentation

## 2016-08-29 DIAGNOSIS — K219 Gastro-esophageal reflux disease without esophagitis: Secondary | ICD-10-CM | POA: Insufficient documentation

## 2016-08-29 DIAGNOSIS — N183 Chronic kidney disease, stage 3 (moderate): Secondary | ICD-10-CM | POA: Insufficient documentation

## 2016-08-29 DIAGNOSIS — E785 Hyperlipidemia, unspecified: Secondary | ICD-10-CM | POA: Diagnosis not present

## 2016-08-29 DIAGNOSIS — I5022 Chronic systolic (congestive) heart failure: Secondary | ICD-10-CM

## 2016-08-29 DIAGNOSIS — I255 Ischemic cardiomyopathy: Secondary | ICD-10-CM | POA: Diagnosis not present

## 2016-08-29 DIAGNOSIS — E1122 Type 2 diabetes mellitus with diabetic chronic kidney disease: Secondary | ICD-10-CM | POA: Diagnosis not present

## 2016-08-29 DIAGNOSIS — Z951 Presence of aortocoronary bypass graft: Secondary | ICD-10-CM | POA: Diagnosis not present

## 2016-08-29 DIAGNOSIS — R42 Dizziness and giddiness: Secondary | ICD-10-CM

## 2016-08-29 DIAGNOSIS — M109 Gout, unspecified: Secondary | ICD-10-CM | POA: Diagnosis not present

## 2016-08-29 DIAGNOSIS — I2510 Atherosclerotic heart disease of native coronary artery without angina pectoris: Secondary | ICD-10-CM | POA: Diagnosis not present

## 2016-08-29 DIAGNOSIS — Z95810 Presence of automatic (implantable) cardiac defibrillator: Secondary | ICD-10-CM | POA: Diagnosis not present

## 2016-08-29 DIAGNOSIS — Z7982 Long term (current) use of aspirin: Secondary | ICD-10-CM | POA: Insufficient documentation

## 2016-08-29 DIAGNOSIS — Z79899 Other long term (current) drug therapy: Secondary | ICD-10-CM | POA: Diagnosis not present

## 2016-08-29 LAB — BASIC METABOLIC PANEL
ANION GAP: 10 (ref 5–15)
BUN: 17 mg/dL (ref 6–20)
CALCIUM: 7.9 mg/dL — AB (ref 8.9–10.3)
CHLORIDE: 103 mmol/L (ref 101–111)
CO2: 26 mmol/L (ref 22–32)
Creatinine, Ser: 1.3 mg/dL — ABNORMAL HIGH (ref 0.61–1.24)
GFR calc non Af Amer: 53 mL/min — ABNORMAL LOW (ref >60)
Glucose, Bld: 230 mg/dL — ABNORMAL HIGH (ref 65–99)
Potassium: 2.9 mmol/L — ABNORMAL LOW (ref 3.5–5.1)
SODIUM: 139 mmol/L (ref 135–145)

## 2016-08-29 LAB — BRAIN NATRIURETIC PEPTIDE: B Natriuretic Peptide: 391.5 pg/mL — ABNORMAL HIGH (ref 0.0–100.0)

## 2016-08-29 MED ORDER — FUROSEMIDE 40 MG PO TABS: ORAL_TABLET | ORAL | 2 refills | Status: DC

## 2016-08-29 MED ORDER — POTASSIUM CHLORIDE CRYS ER 20 MEQ PO TBCR: 40.0000 meq | EXTENDED_RELEASE_TABLET | Freq: Every day | ORAL | 3 refills | Status: DC

## 2016-08-29 MED ORDER — TRAZODONE HCL 50 MG PO TABS: 25.0000 mg | ORAL_TABLET | Freq: Every day | ORAL | 3 refills | Status: DC

## 2016-08-29 NOTE — Telephone Encounter (Signed)
-----   Message from Larey Dresser, MD sent at 08/29/2016  4:49 PM EDT ----- Start KCl 40 daily, take dose today. BMET 1 week.

## 2016-08-29 NOTE — Patient Instructions (Addendum)
Increase Furosemide to 60mg  (1 & 1/2 tabs) Twice daily FOR 3 DAYS ONLY, then take 60 mg in AM and 40 mg in PM  Stop Ambien  Start Trazodone 25 mg (1/2 tab) as needed at bedtime  Labs today  Labs and EKG in 2 weeks  You have been referred to [facility]  Your physician recommends that you schedule a follow-up appointment in: 6 weeks

## 2016-08-29 NOTE — Progress Notes (Signed)
Advanced Heart Failure Clinic Note   Patient ID: [name], male   DOB: Jun 17, 1940, 76 y.o.   MRN: [id number] PCP: Dr. Lynnda Child Cardiology: Dr Aundra Dubin  27 yo with history of CAD s/p CABG and redo CABG as well as ischemic cardiomyopathy with CRT-D device presents for CHF clinic evaluation.  He had a Cardiolite in the 4/15 showing lateral wall ischemia.  LHC at that time showed all his vein grafts from both CABG surgeries occluded.  The LIMA-LAD was patent and there was a 90% distal LM stenosis.  The lateral ischemia likely corresponded to LCx territory downstream from the LM stenosis.  PCI of the distal LM was thought to be high risk and characteristics of the lesion were not favorable for PCI.  Last echo showed EF 25-30% with moderate RV systolic dysfunction.  He had RHC in 11/15 with normal filling pressures and relatively preserved cardiac index (2.55).    In 4/17, he was admitted with chest pain concerning for unstable angina.  Angiography showed 85% distal LM with 90% ostial LCx stenosis.  LIMA was patent but proximal LAD, proximal RCA, and all SVGs were totally occluded. High had successful DES to LM and PTCA to ostial LCx with Impella support.  [name] was stopped and he was put back on low dose lisinopril due to symptomatic hypotension.  He had Cardiolite in 11/17 with scar, no ischemia.   He presents today for followup.  No dypsnea with mild to moderate exertion.  Able to do some yardwork.  Climbs stairs slowly.  Has some generalized fatigue.  No orthopnea/PND.  No BRBPR/melena.  No chest pain.  Weight stable.  Main issue recently has been with "dizziness."  This is a spinning sensation that seems to be brought on by changes in head position.  He fell out of bed last weekend while at [facility] on vacation because of dizziness.  He had facial ecchymoses now.  He had a head CT at the hospital in [place], no hematoma.  SBP in 120s at home.  We checked orthostatics today, he is not  orthostatic.  He ate out a lot in [place] and feels like he has some excess fluid.   Wife is concerned about Ambien, thinks that it alters his mood.  However, he needs something to sleep.   Labs (9/15): LDL particle number 816, LDL 57, LFTs normal Labs (10/15): K 4.4, creatinine 1.4 Labs (11/15): K 4.3, creatinine 1.36 Labs (12/15): K 4.8, creatinine 1.34 Labs (3/16): K 4, creatinine 1.38, LDL 46, HDl 35 Labs (7/16): K 4, creatinine 1.39, BNP 211 Labs (03/03/15): K 4.1, creatinine 1.47, BNP 227, LDL 80, HDL 32 Labs (12/16): K 4.6, creatinine 1.12, LDL 68, HDL 34 Labs (4/17): K 3.4, creatinine 1.15 Labs (6/17): K 3.8, creatinine 1.67 Labs (7/17): K 4, creatinine 1.35 Labs (10/17): K 3.9, creatinine 1.31, LDL 45, HDL 30 Labs (2/18): K 4, creatinine 1.43  Optivol: Fluid index < threshold but trending up and impedance trending down.   PMH: 1. Gout 2. Hyperlipidemia 3. CAD: CABG 1997 and redo 11/12.   - LHC (4/15) with totally occluded LAD, totally occluded RCA, 80% distal LM, 50% mLCx, 2 SVG-RCA grafts totally occluded, 2 SVG-OM grafts totally occluded, patent LIMA-LAD.  Cardiolite prior to 4/15 cath showed lateral wall ischemia (LCx territory).  PCI to distal LM would be a high risk procedure.   - Cardiolite (8/16) with EF 20%, severe scar in RCA and probably LCx territory, minimal peri-infarct ischemia.   -  Cardiolite 02/25/15 with primarily scar from prior MI, minimal ischemia.  - Unstable angina 4/17: 85% distal LM with 90% ostial LCx stenosis.  LIMA was patent but proximal LAD, proximal RCA, and all SVGs were totally occluded. He had successful DES to LM and PTCA to ostial LCx with Impella support  - Cardiolite (11/17): EF 20%, prior MI, no ischemia.  4. Ischemic Cardiomyopathy: Medtronic CRT-D device.   - Echo (6/15) with EF 25-30%, moderate LV dilation, inferior and inferolateral akinesis, moderately decreased RV systolic function, mild MR.   - RHC (11/15) with mean RA 5, PA  23/6, mean PCWP 9, CI 2.55.  - Echo (4/17): EF 25-30% with mild MR.  5. H/o cholecystectomy 6. OA 7. Depression 8. Type II diabetes 9. GERD 10. CKD  SH: Married, prior smoker (many years ago), lives in [place].    FH: CAD  ROS: All systems reviewed and negative except as per HPI.   Current Outpatient Prescriptions  Medication Sig Dispense Refill  . allopurinol (ZYLOPRIM) 100 MG tablet Take 100 mg by mouth 2 (two) times daily.     Marland Kitchen aspirin EC 81 MG tablet Take 81 mg by mouth daily.    Marland Kitchen atorvastatin (LIPITOR) 80 MG tablet TAKE 1 TABLET BY MOUTH DAILY AT 6:00 PM 90 tablet 6  . carvedilol (COREG) 25 MG tablet TAKE ONE TABLET BY MOUTH TWICE DAILY WITH A MEAL 180 tablet 3  . clopidogrel (PLAVIX) 75 MG tablet Take 1 tablet (75 mg total) by mouth daily. 90 tablet 3  . eplerenone (INSPRA) 25 MG tablet Take 1 tablet (25 mg total) by mouth every evening. 30 tablet 6  . furosemide (LASIX) 40 MG tablet Take 1.5 tabs in AM and 1 tab in PM 75 tablet 2  . Insulin Lispro Prot & Lispro (HUMALOG MIX 50/50 KWIKPEN) (50-50) 100 UNIT/ML Kwikpen 15 units at breakfast and lunch, 30 units before supper.  Take insulin 10 minutes before eating 15 mL 0  . Insulin Pen Needle (RELION PEN NEEDLES) 32G X 4 MM MISC Use to inject insulin 3 times per day. 150 each 2  . insulin regular human CONCENTRATED (HUMULIN R U-500 KWIKPEN) 500 UNIT/ML kwikpen 30 units before BFST, 20 at lunch and 35 before supper, take 30-60 minutes before each meal 2 pen 3  . lisinopril (PRINIVIL,ZESTRIL) 5 MG tablet Take 1 tablet (5 mg total) by mouth 2 (two) times daily. 60 tablet 3  . metFORMIN (GLUCOPHAGE) 1000 MG tablet Take 1 tablet (1,000 mg total) by mouth daily with supper. 30 tablet 3  . ONE TOUCH ULTRA TEST test strip Use to check blood sugar 3 times per day. 150 each 3  . ONETOUCH DELICA LANCETS 86P MISC Use to check blood sugar 3 times per day dx code E11.9 100 each 3  . pantoprazole (PROTONIX) 40 MG tablet TAKE ONE TABLET BY  MOUTH ONCE DAILY 30 tablet 11  . ranolazine (RANEXA) 1000 MG SR tablet Take 500 mg by mouth 2 (two) times daily.    Marland Kitchen venlafaxine (EFFEXOR-XR) 150 MG 24 hr capsule Take 150 mg by mouth at bedtime.     Marland Kitchen zolpidem (AMBIEN CR) 12.5 MG CR tablet Take 12.5 mg by mouth at bedtime as needed. For sleep    . nitroGLYCERIN (NITROSTAT) 0.4 MG SL tablet Place 1 tablet (0.4 mg total) under the tongue every 5 (five) minutes x 3 doses as needed. For chest pain. (Patient not taking: Reported on 08/29/2016) 25 tablet 3  . traZODone (DESYREL) 50  MG tablet Take 0.5 tablets (25 mg total) by mouth at bedtime. 15 tablet 3   No current facility-administered medications for this encounter.    BP 132/70   Pulse 69   Wt 206 lb 8 oz (93.7 kg)   SpO2 98%   BMI 29.63 kg/m    Wt Readings from Last 3 Encounters:  08/29/16 206 lb 8 oz (93.7 kg)  05/25/16 205 lb (93 kg)  03/17/16 205 lb (93 kg)     General: NAD Neck: JVP 8 cm, no thyromegaly or nodule noted Lungs: CTAB, normal effort CV: Nondisplaced PMI.  Heart regular S1/S2, no S3/S4, no murmur.  No carotid bruit.  Normal pedal pulses.  Abdomen: Soft, NT, ND, no HSM.  Skin: Facial ecchymoses noted.  Neurologic: Alert and oriented x 3.  Psych: Normal affect. Extremities: No clubbing or cyanosis. No edema.   Assessment/Plan: 1. Chronic systolic CHF: NYHA class II symptoms, stable.  EF 25-30% in 4/17 (stable). He has a Medtronic CRT-D device.  Optivol suggests mild volume accumulation, he ate out a lot on vacation.  - Continue eplerenone 25 mg daily - BP did not tolerate Entresto.  Continue lisinopril 5 mg bid. - Increase Lasix to 60 mg bid x 3 days then 60 qam/40 qpm.  BMET/BNP today, repeat BMET in 2 wks.    - Continue Coreg 25 mg bid. 2. CAD: s/p CABG and redo. Had DES to distal LM, angioplasty to ostial LCx in 4/17.  Cardiolite 11/17 with scar, no ischemia.  No recent chest pain.  - Continue ASA 81, Plavix, statin.  - Continue Coreg and ranolazine 500 mg  bid.  3. CKD stage II-III: BMET today. 4. Hyperlipidemia: Good lipids 10/17.    5. "Dizziness" with fall: From description, sounds like vertigo.  He is not orthostatic.  I would like him to see neurology for evaluation, he would probably benefit from vestibular rehab.  6. Insomnia: Wife thinks that Ambien adversely affects his mood.  I will have him stop Ambien and try trazodone, beginning 25 mg daily.  He will need ECG in 2 wks to follow QT interval as he is also on Effexor.   [name] 08/29/2016

## 2016-08-29 NOTE — Telephone Encounter (Signed)
Notes recorded by Scarlette Calico, RN on 08/29/2016 at 5:02 PM EDT Pt aware, agreeable and verbalizes understanding, rx sent in pt already sch for f/u labs

## 2016-09-01 ENCOUNTER — Telehealth: Payer: Self-pay | Admitting: Cardiology

## 2016-09-01 ENCOUNTER — Ambulatory Visit (INDEPENDENT_AMBULATORY_CARE_PROVIDER_SITE_OTHER): Payer: Managed Care, Other (non HMO)

## 2016-09-01 DIAGNOSIS — I5022 Chronic systolic (congestive) heart failure: Secondary | ICD-10-CM

## 2016-09-01 DIAGNOSIS — Z95810 Presence of automatic (implantable) cardiac defibrillator: Secondary | ICD-10-CM | POA: Diagnosis not present

## 2016-09-01 NOTE — Telephone Encounter (Signed)
LMOVM reminding pt to send remote transmission.   

## 2016-09-06 DIAGNOSIS — I130 Hypertensive heart and chronic kidney disease with heart failure and stage 1 through stage 4 chronic kidney disease, or unspecified chronic kidney disease: Secondary | ICD-10-CM | POA: Diagnosis not present

## 2016-09-06 DIAGNOSIS — Z4802 Encounter for removal of sutures: Secondary | ICD-10-CM | POA: Diagnosis not present

## 2016-09-08 NOTE — Progress Notes (Signed)
EPIC Encounter for ICM Monitoring  Patient Name: [name] is a 76 y.o. male Date: 09/08/2016 Primary Care Physican: Mayra Neer, MD Primary [place] Electrophysiologist: Allred Dry Weight:Last known weight 202 lbs Bi-V Pacing: 97.8%       Heart Failure questions reviewed, pt asymptomatic and reported feeling better after getting rid of the fluid in the last week.  He reported being on vacation and eating out a lot.  This was addressed 5/15 at office visit.   Thoracic impedance almost back at normal after Dr Aundra Dubin increased Furosemide x 3 days at last office visit on 08/29/2016.    Prescribed and confirmed dosages: Furosemide 40 mg 1 tablet (40 mg total) by mouth 2 (two) times daily. Eplerenone 25 mg 1 tablet daily.  Started on Potassium 20 mEq 2 tablets daily following 08/29/2016 lab results.   Labs: 08/29/2016 Creatinine 1.30, BUN 17, Potassium 2.9, Sodium 139, EGFR 53->60, BNP 391.5 06/14/2016 Creatinine 1.43, BUN 26, Potassium 4.0, Sodium 131, EGFR 47-55 05/25/2016 Creatinine 1.39, BUN 20, Potassium 3.7, Sodium 137, EGFR 49-56  10/13/2017Creatinine 1.31, BUN 19, Potassium 3.9, Sodium 138, EGFR 52->60   Recommendations:   No changes today but will recheck fluid levels.  Discussed to limit salt intake to 2000 mg/day and fluid intake to < 2 liters/day.  Encouraged to call for fluid symptoms or use local ER for any urgent symptoms.  Follow-up plan: ICM clinic phone appointment on 09/14/2016.  Office appointment scheduled on 10/16/2016 with Dr Aundra Dubin.  Copy of ICM check sent to device physician.   3 month ICM trend: 09/01/2016   1 Year ICM trend:      [name], RN 09/08/2016 3:41 PM

## 2016-09-12 ENCOUNTER — Ambulatory Visit (HOSPITAL_COMMUNITY)
Admission: RE | Admit: 2016-09-12 | Discharge: 2016-09-12 | Disposition: A | Discharge status: Home / Self Care | Payer: Managed Care, Other (non HMO) | Source: Ambulatory Visit | Attending: Internal Medicine | Admitting: Internal Medicine

## 2016-09-12 ENCOUNTER — Inpatient Hospital Stay (HOSPITAL_COMMUNITY): Admission: RE | Admit: 2016-09-12 | Payer: Managed Care, Other (non HMO) | Source: Ambulatory Visit

## 2016-09-12 VITALS — BP 112/58 | HR 79 | Wt 200.2 lb

## 2016-09-12 DIAGNOSIS — I480 Paroxysmal atrial fibrillation: Secondary | ICD-10-CM | POA: Diagnosis not present

## 2016-09-12 DIAGNOSIS — I5022 Chronic systolic (congestive) heart failure: Secondary | ICD-10-CM | POA: Diagnosis not present

## 2016-09-12 LAB — BASIC METABOLIC PANEL
ANION GAP: 11 (ref 5–15)
BUN: 38 mg/dL — AB (ref 6–20)
CHLORIDE: 100 mmol/L — AB (ref 101–111)
CO2: 24 mmol/L (ref 22–32)
Calcium: 8.7 mg/dL — ABNORMAL LOW (ref 8.9–10.3)
Creatinine, Ser: 1.74 mg/dL — ABNORMAL HIGH (ref 0.61–1.24)
GFR calc Af Amer: 43 mL/min — ABNORMAL LOW (ref >60)
GFR calc non Af Amer: 37 mL/min — ABNORMAL LOW (ref >60)
GLUCOSE: 340 mg/dL — AB (ref 65–99)
POTASSIUM: 5 mmol/L (ref 3.5–5.1)
Sodium: 135 mmol/L (ref 135–145)

## 2016-09-12 NOTE — Progress Notes (Signed)
Pt in for EKG and bmet today.  EKG was done to check QT as pt was started on Trazodone which can cause prolongation along with Effexor, however pt states he was having GI SE of med and his pcp changed him back to Ambien.  Dr Aundra Dubin aware.

## 2016-09-12 NOTE — Addendum Note (Signed)
Encounter addended by: Scarlette Calico, RN on: 09/12/2016  9:22 AM<BR>    Actions taken: Vitals modified

## 2016-09-13 ENCOUNTER — Telehealth (HOSPITAL_COMMUNITY): Payer: Self-pay | Admitting: *Deleted

## 2016-09-13 DIAGNOSIS — I5022 Chronic systolic (congestive) heart failure: Secondary | ICD-10-CM

## 2016-09-13 MED ORDER — LISINOPRIL 5 MG PO TABS: ORAL_TABLET | ORAL | 3 refills | Status: DC

## 2016-09-13 MED ORDER — FUROSEMIDE 40 MG PO TABS: 40.0000 mg | ORAL_TABLET | Freq: Two times a day (BID) | ORAL | 2 refills | Status: DC

## 2016-09-13 NOTE — Telephone Encounter (Signed)
-----   Message from Larey Dresser, MD sent at 09/12/2016  4:34 PM EDT ----- Decrease Lasix to 40 mg bid.  Decrease lisinopril to 5 qam/2.5 qpm.  Repeat BMET in 1 week.  Make sure no supplemental K.

## 2016-09-14 DIAGNOSIS — I25119 Atherosclerotic heart disease of native coronary artery with unspecified angina pectoris: Secondary | ICD-10-CM | POA: Diagnosis not present

## 2016-09-14 DIAGNOSIS — Z0000 Encounter for general adult medical examination without abnormal findings: Secondary | ICD-10-CM | POA: Diagnosis not present

## 2016-09-14 DIAGNOSIS — E1121 Type 2 diabetes mellitus with diabetic nephropathy: Secondary | ICD-10-CM | POA: Diagnosis not present

## 2016-09-14 DIAGNOSIS — Z7984 Long term (current) use of oral hypoglycemic drugs: Secondary | ICD-10-CM | POA: Diagnosis not present

## 2016-09-14 DIAGNOSIS — Z95810 Presence of automatic (implantable) cardiac defibrillator: Secondary | ICD-10-CM | POA: Diagnosis not present

## 2016-09-14 DIAGNOSIS — N183 Chronic kidney disease, stage 3 (moderate): Secondary | ICD-10-CM | POA: Diagnosis not present

## 2016-09-14 DIAGNOSIS — E7800 Pure hypercholesterolemia, unspecified: Secondary | ICD-10-CM | POA: Diagnosis not present

## 2016-09-14 DIAGNOSIS — I5022 Chronic systolic (congestive) heart failure: Secondary | ICD-10-CM | POA: Diagnosis not present

## 2016-09-14 DIAGNOSIS — I255 Ischemic cardiomyopathy: Secondary | ICD-10-CM | POA: Diagnosis not present

## 2016-09-14 DIAGNOSIS — I209 Angina pectoris, unspecified: Secondary | ICD-10-CM | POA: Diagnosis not present

## 2016-09-14 DIAGNOSIS — Z23 Encounter for immunization: Secondary | ICD-10-CM | POA: Diagnosis not present

## 2016-09-14 DIAGNOSIS — D519 Vitamin B12 deficiency anemia, unspecified: Secondary | ICD-10-CM | POA: Diagnosis not present

## 2016-09-14 DIAGNOSIS — I130 Hypertensive heart and chronic kidney disease with heart failure and stage 1 through stage 4 chronic kidney disease, or unspecified chronic kidney disease: Secondary | ICD-10-CM | POA: Diagnosis not present

## 2016-09-15 ENCOUNTER — Telehealth (HOSPITAL_COMMUNITY): Payer: Self-pay | Admitting: *Deleted

## 2016-09-15 NOTE — Progress Notes (Signed)
No ICM remote transmission received to recheck fluid levels for 09/14/2016 and next ICM transmission scheduled for 10/09/2016.

## 2016-09-15 NOTE — Telephone Encounter (Signed)
Patient's wife called asking for which medication changes were made due to his lab work earlier this week.  Patient could not remember all the changes she stated.  I called her back and left detailed message as she requested with all medication changes (Lasix 40 mg BID, Lisinopril 5mg  in the AM and 2.5 mg in the PM, and to stop all potassium supplements).  I told her if she still needed further information or had questions to please call me back.

## 2016-09-21 ENCOUNTER — Ambulatory Visit (HOSPITAL_COMMUNITY)
Admission: RE | Admit: 2016-09-21 | Discharge: 2016-09-21 | Disposition: A | Discharge status: Home / Self Care | Payer: Managed Care, Other (non HMO) | Source: Ambulatory Visit | Attending: Cardiology | Admitting: Cardiology

## 2016-09-21 DIAGNOSIS — I5022 Chronic systolic (congestive) heart failure: Secondary | ICD-10-CM | POA: Insufficient documentation

## 2016-09-21 LAB — BASIC METABOLIC PANEL
ANION GAP: 9 (ref 5–15)
BUN: 27 mg/dL — ABNORMAL HIGH (ref 6–20)
CALCIUM: 8.3 mg/dL — AB (ref 8.9–10.3)
CO2: 27 mmol/L (ref 22–32)
Chloride: 99 mmol/L — ABNORMAL LOW (ref 101–111)
Creatinine, Ser: 1.49 mg/dL — ABNORMAL HIGH (ref 0.61–1.24)
GFR, EST AFRICAN AMERICAN: 52 mL/min — AB (ref >60)
GFR, EST NON AFRICAN AMERICAN: 44 mL/min — AB (ref >60)
GLUCOSE: 280 mg/dL — AB (ref 65–99)
Potassium: 4.1 mmol/L (ref 3.5–5.1)
SODIUM: 135 mmol/L (ref 135–145)

## 2016-09-22 ENCOUNTER — Other Ambulatory Visit (HOSPITAL_COMMUNITY): Payer: Self-pay | Admitting: Cardiology

## 2016-09-22 DIAGNOSIS — N179 Acute kidney failure, unspecified: Secondary | ICD-10-CM | POA: Diagnosis not present

## 2016-09-28 ENCOUNTER — Other Ambulatory Visit (HOSPITAL_COMMUNITY): Payer: Self-pay

## 2016-09-28 MED ORDER — LISINOPRIL 5 MG PO TABS: ORAL_TABLET | ORAL | 3 refills | Status: DC

## 2016-10-09 ENCOUNTER — Telehealth: Payer: Self-pay | Admitting: Cardiology

## 2016-10-09 NOTE — Telephone Encounter (Signed)
Spoke with pt and reminded pt of remote transmission that is due today. Pt verbalized understanding.   

## 2016-10-13 DIAGNOSIS — I209 Angina pectoris, unspecified: Secondary | ICD-10-CM | POA: Diagnosis not present

## 2016-10-13 DIAGNOSIS — D519 Vitamin B12 deficiency anemia, unspecified: Secondary | ICD-10-CM | POA: Diagnosis not present

## 2016-10-13 DIAGNOSIS — N179 Acute kidney failure, unspecified: Secondary | ICD-10-CM | POA: Diagnosis not present

## 2016-10-16 ENCOUNTER — Ambulatory Visit (HOSPITAL_COMMUNITY)
Admission: RE | Admit: 2016-10-16 | Discharge: 2016-10-16 | Disposition: A | Discharge status: Home / Self Care | Payer: Managed Care, Other (non HMO) | Source: Ambulatory Visit | Attending: Cardiology | Admitting: Cardiology

## 2016-10-16 ENCOUNTER — Encounter (HOSPITAL_COMMUNITY): Payer: Self-pay | Admitting: Cardiology

## 2016-10-16 VITALS — BP 105/58 | HR 72 | Wt 198.0 lb

## 2016-10-16 DIAGNOSIS — N183 Chronic kidney disease, stage 3 (moderate): Secondary | ICD-10-CM | POA: Diagnosis not present

## 2016-10-16 DIAGNOSIS — R42 Dizziness and giddiness: Secondary | ICD-10-CM | POA: Diagnosis not present

## 2016-10-16 DIAGNOSIS — I5022 Chronic systolic (congestive) heart failure: Secondary | ICD-10-CM | POA: Diagnosis not present

## 2016-10-16 DIAGNOSIS — K219 Gastro-esophageal reflux disease without esophagitis: Secondary | ICD-10-CM | POA: Diagnosis not present

## 2016-10-16 DIAGNOSIS — F329 Major depressive disorder, single episode, unspecified: Secondary | ICD-10-CM | POA: Insufficient documentation

## 2016-10-16 DIAGNOSIS — Z951 Presence of aortocoronary bypass graft: Secondary | ICD-10-CM | POA: Insufficient documentation

## 2016-10-16 DIAGNOSIS — Z87891 Personal history of nicotine dependence: Secondary | ICD-10-CM | POA: Insufficient documentation

## 2016-10-16 DIAGNOSIS — I2510 Atherosclerotic heart disease of native coronary artery without angina pectoris: Secondary | ICD-10-CM | POA: Insufficient documentation

## 2016-10-16 DIAGNOSIS — E1122 Type 2 diabetes mellitus with diabetic chronic kidney disease: Secondary | ICD-10-CM | POA: Diagnosis not present

## 2016-10-16 DIAGNOSIS — I252 Old myocardial infarction: Secondary | ICD-10-CM | POA: Diagnosis not present

## 2016-10-16 DIAGNOSIS — Z794 Long term (current) use of insulin: Secondary | ICD-10-CM | POA: Insufficient documentation

## 2016-10-16 DIAGNOSIS — Z7982 Long term (current) use of aspirin: Secondary | ICD-10-CM | POA: Diagnosis not present

## 2016-10-16 DIAGNOSIS — Z7902 Long term (current) use of antithrombotics/antiplatelets: Secondary | ICD-10-CM | POA: Insufficient documentation

## 2016-10-16 DIAGNOSIS — I255 Ischemic cardiomyopathy: Secondary | ICD-10-CM | POA: Diagnosis not present

## 2016-10-16 DIAGNOSIS — Z79899 Other long term (current) drug therapy: Secondary | ICD-10-CM | POA: Diagnosis not present

## 2016-10-16 DIAGNOSIS — E785 Hyperlipidemia, unspecified: Secondary | ICD-10-CM | POA: Insufficient documentation

## 2016-10-16 DIAGNOSIS — I480 Paroxysmal atrial fibrillation: Secondary | ICD-10-CM

## 2016-10-16 NOTE — Patient Instructions (Signed)
Follow up with Dr. McLean in 3-4 months.   

## 2016-10-17 NOTE — Progress Notes (Signed)
Advanced Heart Failure Clinic Note   Patient ID: [name], male   DOB: 1941/03/10, 76 y.o.   MRN: [id number] PCP: Dr. Lynnda Child Cardiology: Dr Aundra Dubin  72 yo with history of CAD s/p CABG and redo CABG as well as ischemic cardiomyopathy with CRT-D device presents for CHF clinic evaluation.  He had a Cardiolite in the 4/15 showing lateral wall ischemia.  LHC at that time showed all his vein grafts from both CABG surgeries occluded.  The LIMA-LAD was patent and there was a 90% distal LM stenosis.  The lateral ischemia likely corresponded to LCx territory downstream from the LM stenosis.  PCI of the distal LM was thought to be high risk and characteristics of the lesion were not favorable for PCI.  Last echo showed EF 25-30% with moderate RV systolic dysfunction.  He had RHC in 11/15 with normal filling pressures and relatively preserved cardiac index (2.55).    In 4/17, he was admitted with chest pain concerning for unstable angina.  Angiography showed 85% distal LM with 90% ostial LCx stenosis.  LIMA was patent but proximal LAD, proximal RCA, and all SVGs were totally occluded. High had successful DES to LM and PTCA to ostial LCx with Impella support.  [name] was stopped and he was put back on low dose lisinopril due to symptomatic hypotension.  He had Cardiolite in 11/17 with scar, no ischemia.   He returns for followup today.  At last appointment, I had increased Lasix to 60 qam/40 qpm.  He took this dose for a while but now is back to 40 mg bid.  Weight is down 8 lbs.  He denies dyspnea with ADLs, light work.  He can climb a flight of stairs without problems.  No chest pain.  No further lightheadedness.    Labs (9/15): LDL particle number 816, LDL 57, LFTs normal Labs (10/15): K 4.4, creatinine 1.4 Labs (11/15): K 4.3, creatinine 1.36 Labs (12/15): K 4.8, creatinine 1.34 Labs (3/16): K 4, creatinine 1.38, LDL 46, HDl 35 Labs (7/16): K 4, creatinine 1.39, BNP 211 Labs (03/03/15): K 4.1,  creatinine 1.47, BNP 227, LDL 80, HDL 32 Labs (12/16): K 4.6, creatinine 1.12, LDL 68, HDL 34 Labs (4/17): K 3.4, creatinine 1.15 Labs (6/17): K 3.8, creatinine 1.67 Labs (7/17): K 4, creatinine 1.35 Labs (10/17): K 3.9, creatinine 1.31, LDL 45, HDL 30 Labs (2/18): K 4, creatinine 1.43 Labs (6/18): K 4.1, creatinine 1.49  ECG (personally reviewed): NSR, BiV paced, QTc 495  PMH: 1. Gout 2. Hyperlipidemia 3. CAD: CABG 1997 and redo 11/12.   - LHC (4/15) with totally occluded LAD, totally occluded RCA, 80% distal LM, 50% mLCx, 2 SVG-RCA grafts totally occluded, 2 SVG-OM grafts totally occluded, patent LIMA-LAD.  Cardiolite prior to 4/15 cath showed lateral wall ischemia (LCx territory).  PCI to distal LM would be a high risk procedure.   - Cardiolite (8/16) with EF 20%, severe scar in RCA and probably LCx territory, minimal peri-infarct ischemia.   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  - Unstable angina 4/17: 85% distal LM with 90% ostial LCx stenosis.  LIMA was patent but proximal LAD, proximal RCA, and all SVGs were totally occluded. He had successful DES to LM and PTCA to ostial LCx with Impella support  - Cardiolite (11/17): EF 20%, prior MI, no ischemia.  4. Ischemic Cardiomyopathy: Medtronic CRT-D device.   - Echo (6/15) with EF 25-30%, moderate LV dilation, inferior and inferolateral akinesis, moderately  decreased RV systolic function, mild MR.   - RHC (11/15) with mean RA 5, PA 23/6, mean PCWP 9, CI 2.55.  - Echo (4/17): EF 25-30% with mild MR.  5. H/o cholecystectomy 6. OA 7. Depression 8. Type II diabetes 9. GERD 10. CKD  SH: Married, prior smoker (many years ago), lives in [place].    FH: CAD  ROS: All systems reviewed and negative except as per HPI.   Current Outpatient Prescriptions  Medication Sig Dispense Refill  . allopurinol (ZYLOPRIM) 100 MG tablet Take 100 mg by mouth 2 (two) times daily.     Marland Kitchen aspirin EC 81 MG tablet  Take 81 mg by mouth daily.    Marland Kitchen atorvastatin (LIPITOR) 80 MG tablet TAKE 1 TABLET BY MOUTH DAILY AT 6:00 PM 90 tablet 6  . carvedilol (COREG) 25 MG tablet TAKE ONE TABLET BY MOUTH TWICE DAILY WITH A MEAL 180 tablet 3  . clopidogrel (PLAVIX) 75 MG tablet Take 1 tablet (75 mg total) by mouth daily. 90 tablet 3  . eplerenone (INSPRA) 25 MG tablet Take 1 tablet (25 mg total) by mouth every evening. 30 tablet 6  . furosemide (LASIX) 40 MG tablet Take 1 tablet (40 mg total) by mouth 2 (two) times daily. 75 tablet 2  . Insulin Lispro Prot & Lispro (HUMALOG MIX 50/50 KWIKPEN) (50-50) 100 UNIT/ML Kwikpen 15 units at breakfast and lunch, 30 units before supper.  Take insulin 10 minutes before eating 15 mL 0  . Insulin Pen Needle (RELION PEN NEEDLES) 32G X 4 MM MISC Use to inject insulin 3 times per day. 150 each 2  . insulin regular human CONCENTRATED (HUMULIN R U-500 KWIKPEN) 500 UNIT/ML kwikpen 30 units before BFST, 20 at lunch and 35 before supper, take 30-60 minutes before each meal 2 pen 3  . lisinopril (PRINIVIL,ZESTRIL) 5 MG tablet Take 5mg  in the AM and 2.5mg  in the PM 135 tablet 3  . metFORMIN (GLUCOPHAGE) 1000 MG tablet Take 1 tablet (1,000 mg total) by mouth daily with supper. 30 tablet 3  . ONE TOUCH ULTRA TEST test strip Use to check blood sugar 3 times per day. 150 each 3  . ONETOUCH DELICA LANCETS 64Q MISC Use to check blood sugar 3 times per day dx code E11.9 100 each 3  . pantoprazole (PROTONIX) 40 MG tablet TAKE ONE TABLET BY MOUTH ONCE DAILY 30 tablet 11  . ranolazine (RANEXA) 1000 MG SR tablet Take 500 mg by mouth 2 (two) times daily.    Marland Kitchen venlafaxine (EFFEXOR-XR) 150 MG 24 hr capsule Take 150 mg by mouth at bedtime.     Marland Kitchen zolpidem (AMBIEN CR) 12.5 MG CR tablet Take 12.5 mg by mouth at bedtime as needed. For sleep    . nitroGLYCERIN (NITROSTAT) 0.4 MG SL tablet Place 1 tablet (0.4 mg total) under the tongue every 5 (five) minutes x 3 doses as needed. For chest pain. (Patient not taking:  Reported on 10/16/2016) 25 tablet 3   No current facility-administered medications for this encounter.    BP (!) 105/58   Pulse 72   Wt 198 lb (89.8 kg)   SpO2 98%   BMI 28.41 kg/m    Wt Readings from Last 3 Encounters:  10/16/16 198 lb (89.8 kg)  09/12/16 200 lb 4 oz (90.8 kg)  08/29/16 206 lb 8 oz (93.7 kg)     General: NAD Neck: No JVD, no thyromegaly or nodule noted Lungs: CTAB, normal effort CV: Nondisplaced PMI.  Heart regular  S1/S2, no S3/S4, no murmur.  No carotid bruit.  Normal pedal pulses.  Abdomen: Soft, NT, ND, no HSM.  Skin: Facial ecchymoses noted.  Neurologic: Alert and oriented x 3.  Psych: Normal affect. Extremities: No clubbing or cyanosis. No edema.   Assessment/Plan: 1. Chronic systolic CHF: NYHA class II symptoms, stable.  EF 25-30% in 4/17 (stable). He has a Medtronic CRT-D device.  Weight is down today, I think he looks euvolemic.  NYHA class II symptoms.   - Continue eplerenone 25 mg daily - BP did not tolerate Entresto.  Continue lisinopril 5 mg bid. - He can continue Lasix at 40 mg bid.    - Continue Coreg 25 mg bid. 2. CAD: s/p CABG and redo. Had DES to distal LM, angioplasty to ostial LCx in 4/17.  Cardiolite 11/17 with scar, no ischemia.  No recent chest pain.  - Continue ASA 81, Plavix, statin.  - Continue Coreg and ranolazine 500 mg bid.  3. CKD stage II-III: Recent BMET was stable.  4. Hyperlipidemia: Good lipids 10/17.    5. "Dizziness" with fall: From description, sounds like vertigo.  He has not been orthostatic.  This seems to have resolved.   [name] 10/17/2016

## 2016-10-19 NOTE — Progress Notes (Signed)
No ICM remote transmission received for 10/09/2016 and next ICM transmission scheduled for 10/30/2016.

## 2016-10-26 DIAGNOSIS — K5900 Constipation, unspecified: Secondary | ICD-10-CM | POA: Diagnosis not present

## 2016-10-26 DIAGNOSIS — N179 Acute kidney failure, unspecified: Secondary | ICD-10-CM | POA: Diagnosis not present

## 2016-10-30 ENCOUNTER — Telehealth: Payer: Self-pay | Admitting: Cardiology

## 2016-10-30 ENCOUNTER — Encounter: Payer: Managed Care, Other (non HMO) | Admitting: *Deleted

## 2016-10-30 NOTE — Telephone Encounter (Signed)
Spoke with pt and reminded pt of remote transmission that is due today. Pt verbalized understanding.   

## 2016-11-02 ENCOUNTER — Encounter: Payer: Self-pay | Admitting: Cardiology

## 2016-11-03 NOTE — Progress Notes (Signed)
No ICM remote transmission received for 10/30/2016 and next ICM transmission scheduled for 11/21/2016.

## 2016-11-21 ENCOUNTER — Ambulatory Visit (INDEPENDENT_AMBULATORY_CARE_PROVIDER_SITE_OTHER): Payer: Managed Care, Other (non HMO) | Admitting: *Deleted

## 2016-11-21 DIAGNOSIS — Z95810 Presence of automatic (implantable) cardiac defibrillator: Secondary | ICD-10-CM | POA: Diagnosis not present

## 2016-11-21 DIAGNOSIS — I255 Ischemic cardiomyopathy: Secondary | ICD-10-CM | POA: Diagnosis not present

## 2016-11-21 DIAGNOSIS — I5022 Chronic systolic (congestive) heart failure: Secondary | ICD-10-CM

## 2016-11-21 NOTE — Progress Notes (Signed)
ICD Remote transmission received 

## 2016-11-21 NOTE — Progress Notes (Signed)
EPIC Encounter for ICM Monitoring  Patient Name: [name] is a 76 y.o. male Date: 11/21/2016 Primary Care Physican: Shaw, Kimberlee, MD Primary Cardiologist:McLean Electrophysiologist: Allred Dry Weight: 197 lbs Bi-V Pacing: 97.7%       Heart Failure questions reviewed, pt had weight gain and swelling of feet but both are now resolved.     Thoracic impedance just below baseline  Prescribed dosage: Furosemide 40 mg 1 tablet (40 mg total) by mouth 2 (two) times daily.   Labs: 08/29/2016 Creatinine 1.30, BUN 17, Potassium 2.9, Sodium 139, EGFR 53->60, BNP 391.5 06/14/2016 Creatinine 1.43, BUN 26, Potassium 4.0, Sodium 131, EGFR 47-55 05/25/2016 Creatinine 1.39, BUN 20, Potassium 3.7, Sodium 137, EGFR 49-56  10/13/2017Creatinine 1.31, BUN 19, Potassium 3.9, Sodium 138, EGFR 52->60   Recommendations: No changes.  Advised to limit salt intake to 2000 mg/day and fluid intake to < 2 liters/day.  Encouraged to call for fluid symptoms.  Follow-up plan: ICM clinic phone appointment on 12/22/2016.   Advised he is due to make appointments with Dr McLean and Dr Allred in October.  He will call the office to set up.   Copy of ICM check sent to primary cardiologist and device physician.   3 month ICM trend: 11/21/2016   1 Year ICM trend:      Laurie S Short, RN 11/21/2016 4:31 PM    

## 2016-11-22 ENCOUNTER — Encounter: Payer: Self-pay | Admitting: Cardiology

## 2016-12-08 LAB — CUP PACEART REMOTE DEVICE CHECK
Battery Voltage: 2.78 V
Brady Statistic AP VP Percent: 1.21 %
Brady Statistic AP VS Percent: 0.01 %
Brady Statistic AS VP Percent: 98.41 %
Brady Statistic AS VS Percent: 0.38 %
Date Time Interrogation Session: 20180807041703
HIGH POWER IMPEDANCE MEASURED VALUE: 43 Ohm
HIGH POWER IMPEDANCE MEASURED VALUE: 456 Ohm
HighPow Impedance: 56 Ohm
Implantable Lead Implant Date: 20130509
Implantable Lead Location: 753859
Implantable Lead Location: 753860
Implantable Lead Model: 4196
Implantable Lead Model: 6947
Lead Channel Impedance Value: 399 Ohm
Lead Channel Impedance Value: 399 Ohm
Lead Channel Impedance Value: 532 Ohm
Lead Channel Impedance Value: 665 Ohm
Lead Channel Pacing Threshold Amplitude: 0.75 V
Lead Channel Pacing Threshold Amplitude: 2 V
Lead Channel Pacing Threshold Pulse Width: 0.4 ms
Lead Channel Pacing Threshold Pulse Width: 0.4 ms
Lead Channel Sensing Intrinsic Amplitude: 1.375 mV
Lead Channel Sensing Intrinsic Amplitude: 31.625 mV
Lead Channel Setting Pacing Amplitude: 2.5 V
Lead Channel Setting Pacing Pulse Width: 0.4 ms
Lead Channel Setting Sensing Sensitivity: 0.3 mV
MDC IDC LEAD IMPLANT DT: 20111003
MDC IDC LEAD IMPLANT DT: 20111003
MDC IDC LEAD LOCATION: 753858
MDC IDC MSMT LEADCHNL LV PACING THRESHOLD PULSEWIDTH: 0.4 ms
MDC IDC MSMT LEADCHNL RA IMPEDANCE VALUE: 418 Ohm
MDC IDC MSMT LEADCHNL RA SENSING INTR AMPL: 1.375 mV
MDC IDC MSMT LEADCHNL RV PACING THRESHOLD AMPLITUDE: 0.625 V
MDC IDC MSMT LEADCHNL RV SENSING INTR AMPL: 31.625 mV
MDC IDC PG IMPLANT DT: 20130509
MDC IDC SET LEADCHNL LV PACING AMPLITUDE: 1.75 V
MDC IDC SET LEADCHNL LV PACING PULSEWIDTH: 0.4 ms
MDC IDC SET LEADCHNL RA PACING AMPLITUDE: 3 V
MDC IDC STAT BRADY RA PERCENT PACED: 1.2 %
MDC IDC STAT BRADY RV PERCENT PACED: 97.68 %

## 2016-12-22 ENCOUNTER — Ambulatory Visit (INDEPENDENT_AMBULATORY_CARE_PROVIDER_SITE_OTHER): Payer: Managed Care, Other (non HMO)

## 2016-12-22 DIAGNOSIS — Z95810 Presence of automatic (implantable) cardiac defibrillator: Secondary | ICD-10-CM | POA: Diagnosis not present

## 2016-12-22 DIAGNOSIS — I5022 Chronic systolic (congestive) heart failure: Secondary | ICD-10-CM | POA: Diagnosis not present

## 2016-12-22 NOTE — Progress Notes (Signed)
EPIC Encounter for ICM Monitoring  Patient Name: [name] is a 76 y.o. male Date: 12/22/2016 Primary Care Physican: Mayra Neer, MD Primary Cardiologist:McLean Electrophysiologist: Allred Dry Weight: 197 lbs Bi-V Pacing: 96.5%       Heart Failure questions reviewed, pt asymptomatic.   Thoracic impedance normal.  Prescribed dosage: Furosemide 40 mg 1 tablet (40 mg total) by mouth 2 (two) times daily.   Labs: 09/21/2016 Creatinine 1.49, BUN 27, Potassium 4.1, Sodium 135, EGFR 44-52 09/14/2016 Creatinine 1.62, BUN 36, Potassium 4.5, Sodium 135, EGFR 42-51 09/12/2016 Creatinine 1.74, BUN 38, Potassium 5.0, Sodium 135, EGFR 37-43 08/29/2016 Creatinine 1.30, BUN 17, Potassium 2.9, Sodium 139, EGFR 53->60, BNP 391.5 06/14/2016 Creatinine 1.43, BUN 26, Potassium 4.0, Sodium 131, EGFR 47-55 05/25/2016 Creatinine 1.39, BUN 20, Potassium 3.7, Sodium 137, EGFR 49-56  10/13/2017Creatinine 1.31, BUN 19, Potassium 3.9, Sodium 138, EGFR 52->60  Recommendations: No changes.  Encouraged to call for fluid symptoms.  Follow-up plan: ICM clinic phone appointment on 02/26/2017.  Office appointment scheduled 01/16/2017 with Dr. Aundra Dubin and office defib check 01/26/2017 with Dr Rayann Heman.  Copy of ICM check sent to Dr. Rayann Heman.   3 month ICM trend: 12/22/2016   1 Year ICM trend:      [name], RN 12/22/2016 7:35 AM

## 2017-01-09 ENCOUNTER — Encounter (HOSPITAL_COMMUNITY): Payer: Managed Care, Other (non HMO) | Admitting: Cardiology

## 2017-01-16 ENCOUNTER — Ambulatory Visit (HOSPITAL_COMMUNITY)
Admission: RE | Admit: 2017-01-16 | Discharge: 2017-01-16 | Disposition: A | Discharge status: Home / Self Care | Payer: Managed Care, Other (non HMO) | Source: Ambulatory Visit | Attending: Cardiology | Admitting: Cardiology

## 2017-01-16 ENCOUNTER — Encounter (HOSPITAL_COMMUNITY): Payer: Self-pay | Admitting: Cardiology

## 2017-01-16 VITALS — BP 125/61 | HR 72 | Wt 200.5 lb

## 2017-01-16 DIAGNOSIS — E1122 Type 2 diabetes mellitus with diabetic chronic kidney disease: Secondary | ICD-10-CM | POA: Insufficient documentation

## 2017-01-16 DIAGNOSIS — I255 Ischemic cardiomyopathy: Secondary | ICD-10-CM | POA: Insufficient documentation

## 2017-01-16 DIAGNOSIS — Z7982 Long term (current) use of aspirin: Secondary | ICD-10-CM | POA: Insufficient documentation

## 2017-01-16 DIAGNOSIS — Z951 Presence of aortocoronary bypass graft: Secondary | ICD-10-CM | POA: Diagnosis not present

## 2017-01-16 DIAGNOSIS — K219 Gastro-esophageal reflux disease without esophagitis: Secondary | ICD-10-CM | POA: Diagnosis not present

## 2017-01-16 DIAGNOSIS — Z794 Long term (current) use of insulin: Secondary | ICD-10-CM | POA: Diagnosis not present

## 2017-01-16 DIAGNOSIS — I2510 Atherosclerotic heart disease of native coronary artery without angina pectoris: Secondary | ICD-10-CM | POA: Insufficient documentation

## 2017-01-16 DIAGNOSIS — Z7902 Long term (current) use of antithrombotics/antiplatelets: Secondary | ICD-10-CM | POA: Diagnosis not present

## 2017-01-16 DIAGNOSIS — Z87891 Personal history of nicotine dependence: Secondary | ICD-10-CM | POA: Diagnosis not present

## 2017-01-16 DIAGNOSIS — E785 Hyperlipidemia, unspecified: Secondary | ICD-10-CM | POA: Insufficient documentation

## 2017-01-16 DIAGNOSIS — F329 Major depressive disorder, single episode, unspecified: Secondary | ICD-10-CM | POA: Diagnosis not present

## 2017-01-16 DIAGNOSIS — I5022 Chronic systolic (congestive) heart failure: Secondary | ICD-10-CM | POA: Insufficient documentation

## 2017-01-16 DIAGNOSIS — Z95810 Presence of automatic (implantable) cardiac defibrillator: Secondary | ICD-10-CM | POA: Diagnosis not present

## 2017-01-16 DIAGNOSIS — Z79899 Other long term (current) drug therapy: Secondary | ICD-10-CM | POA: Diagnosis not present

## 2017-01-16 DIAGNOSIS — Z9049 Acquired absence of other specified parts of digestive tract: Secondary | ICD-10-CM | POA: Diagnosis not present

## 2017-01-16 DIAGNOSIS — N183 Chronic kidney disease, stage 3 (moderate): Secondary | ICD-10-CM | POA: Diagnosis not present

## 2017-01-16 LAB — BASIC METABOLIC PANEL
Anion gap: 9 (ref 5–15)
BUN: 18 mg/dL (ref 6–20)
CHLORIDE: 102 mmol/L (ref 101–111)
CO2: 26 mmol/L (ref 22–32)
CREATININE: 1.28 mg/dL — AB (ref 0.61–1.24)
Calcium: 8.2 mg/dL — ABNORMAL LOW (ref 8.9–10.3)
GFR calc Af Amer: >60 mL/min (ref >60)
GFR calc non Af Amer: 53 mL/min — ABNORMAL LOW (ref >60)
Glucose, Bld: 281 mg/dL — ABNORMAL HIGH (ref 65–99)
Potassium: 3.5 mmol/L (ref 3.5–5.1)
Sodium: 137 mmol/L (ref 135–145)

## 2017-01-16 LAB — BRAIN NATRIURETIC PEPTIDE: B Natriuretic Peptide: 381.3 pg/mL — ABNORMAL HIGH (ref 0.0–100.0)

## 2017-01-16 MED ORDER — LISINOPRIL 5 MG PO TABS: ORAL_TABLET | ORAL | 3 refills | Status: DC

## 2017-01-16 MED ORDER — FUROSEMIDE 40 MG PO TABS: 40.0000 mg | ORAL_TABLET | Freq: Two times a day (BID) | ORAL | 2 refills | Status: DC

## 2017-01-16 NOTE — Patient Instructions (Signed)
Increase Furosemide 60 mg (1.5 tab), twice a day for 2 days  Then take 40 mg (1 tab), twice a day  Lisinopril 2.5 mg (1/2 tab) in am, and then 5 mg (1 tab) in pm   Labs drawn today (if we do not call you, then your lab work was stable)   You have been referred to Cardiac Rehab at [facility] they will call you   Your physician recommends that you schedule a follow-up appointment in: 3 months

## 2017-01-16 NOTE — Progress Notes (Signed)
Advanced Heart Failure Clinic Note   Patient ID: [name], male   DOB: November 17, 1940, 76 y.o.   MRN: [id number] PCP: Dr. Lynnda Child Cardiology: Dr Aundra Dubin  16 yo with history of CAD s/p CABG and redo CABG as well as ischemic cardiomyopathy with CRT-D device presents for followup of CHF and CAD.  He had a Cardiolite in the 4/15 showing lateral wall ischemia.  LHC at that time showed all his vein grafts from both CABG surgeries occluded.  The LIMA-LAD was patent and there was a 90% distal LM stenosis.  The lateral ischemia likely corresponded to LCx territory downstream from the LM stenosis.  PCI of the distal LM was thought to be high risk and characteristics of the lesion were not favorable for PCI.  Last echo showed EF 25-30% with moderate RV systolic dysfunction.  He had RHC in 11/15 with normal filling pressures and relatively preserved cardiac index (2.55).    In 4/17, he was admitted with chest pain concerning for unstable angina.  Angiography showed 85% distal LM with 90% ostial LCx stenosis.  LIMA was patent but proximal LAD, proximal RCA, and all SVGs were totally occluded. High had successful DES to LM and PTCA to ostial LCx with Impella support.  [name] was stopped and he was put back on low dose lisinopril due to symptomatic hypotension.  He had Cardiolite in 11/17 with scar, no ischemia.   He is stable symptomatically. No dyspnea walking on flat ground or up a flight of steps.  Dyspnea only with heavy exertion.  No recent falls but still is occasionally lightheaded if he stands fast.  Balance is poor.     Labs (9/15): LDL particle number 816, LDL 57, LFTs normal Labs (10/15): K 4.4, creatinine 1.4 Labs (11/15): K 4.3, creatinine 1.36 Labs (12/15): K 4.8, creatinine 1.34 Labs (3/16): K 4, creatinine 1.38, LDL 46, HDl 35 Labs (7/16): K 4, creatinine 1.39, BNP 211 Labs (03/03/15): K 4.1, creatinine 1.47, BNP 227, LDL 80, HDL 32 Labs (12/16): K 4.6, creatinine 1.12, LDL 68, HDL 34 Labs  (4/17): K 3.4, creatinine 1.15 Labs (6/17): K 3.8, creatinine 1.67 Labs (7/17): K 4, creatinine 1.35 Labs (10/17): K 3.9, creatinine 1.31, LDL 45, HDL 30 Labs (2/18): K 4, creatinine 1.43 Labs (5/18): LDL 44, HDL 27 Labs (6/18): K 4.1, creatinine 1.49  Optivol: thoracic impedance decreasing but fluid index still < threshold. > 99% BiV paced.  No VT.   PMH: 1. Gout 2. Hyperlipidemia 3. CAD: CABG 1997 and redo 11/12.   - LHC (4/15) with totally occluded LAD, totally occluded RCA, 80% distal LM, 50% mLCx, 2 SVG-RCA grafts totally occluded, 2 SVG-OM grafts totally occluded, patent LIMA-LAD.  Cardiolite prior to 4/15 cath showed lateral wall ischemia (LCx territory).  PCI to distal LM would be a high risk procedure.   - Cardiolite (8/16) with EF 20%, severe scar in RCA and probably LCx territory, minimal peri-infarct ischemia.   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  - Unstable angina 4/17: 85% distal LM with 90% ostial LCx stenosis.  LIMA was patent but proximal LAD, proximal RCA, and all SVGs were totally occluded. He had successful DES to LM and PTCA to ostial LCx with Impella support  - Cardiolite (11/17): EF 20%, prior MI, no ischemia.  4. Ischemic Cardiomyopathy: Medtronic CRT-D device.   - Echo (6/15) with EF 25-30%, moderate LV dilation, inferior and inferolateral akinesis, moderately decreased RV systolic function, mild MR.   -  RHC (11/15) with mean RA 5, PA 23/6, mean PCWP 9, CI 2.55.  - Echo (4/17): EF 25-30% with mild MR.  - Echo (11/17): EF 25-30%, moderately dilated LV, grade II diastolic dysfunction, mild-moderate MR, mildly decreased RV systolic function.  5. H/o cholecystectomy 6. OA 7. Depression 8. Type II diabetes 9. GERD 10. CKD  SH: Married, prior smoker (many years ago), lives in [place].    FH: CAD  ROS: All systems reviewed and negative except as per HPI.   Current Outpatient Prescriptions  Medication Sig Dispense  Refill  . allopurinol (ZYLOPRIM) 100 MG tablet Take 100 mg by mouth 2 (two) times daily.     Marland Kitchen aspirin EC 81 MG tablet Take 81 mg by mouth daily.    Marland Kitchen atorvastatin (LIPITOR) 80 MG tablet TAKE 1 TABLET BY MOUTH DAILY AT 6:00 PM 90 tablet 6  . carvedilol (COREG) 25 MG tablet TAKE ONE TABLET BY MOUTH TWICE DAILY WITH A MEAL 180 tablet 3  . clopidogrel (PLAVIX) 75 MG tablet Take 1 tablet (75 mg total) by mouth daily. 90 tablet 3  . eplerenone (INSPRA) 25 MG tablet Take 1 tablet (25 mg total) by mouth every evening. 30 tablet 6  . furosemide (LASIX) 40 MG tablet Take 1 tablet (40 mg total) by mouth 2 (two) times daily. For 2 days Take 60 mg (1.5 tab), twice a day 75 tablet 2  . Insulin Lispro Prot & Lispro (HUMALOG MIX 50/50 KWIKPEN) (50-50) 100 UNIT/ML Kwikpen 15 units at breakfast and lunch, 30 units before supper.  Take insulin 10 minutes before eating 15 mL 0  . Insulin Pen Needle (RELION PEN NEEDLES) 32G X 4 MM MISC Use to inject insulin 3 times per day. 150 each 2  . insulin regular human CONCENTRATED (HUMULIN R U-500 KWIKPEN) 500 UNIT/ML kwikpen 30 units before BFST, 20 at lunch and 35 before supper, take 30-60 minutes before each meal 2 pen 3  . lisinopril (PRINIVIL,ZESTRIL) 5 MG tablet Take 2.5 mg in the AM and 5mg  in the PM 45 tablet 3  . metFORMIN (GLUCOPHAGE) 1000 MG tablet Take 1 tablet (1,000 mg total) by mouth daily with supper. 30 tablet 3  . ONE TOUCH ULTRA TEST test strip Use to check blood sugar 3 times per day. 150 each 3  . ONETOUCH DELICA LANCETS 81W MISC Use to check blood sugar 3 times per day dx code E11.9 100 each 3  . pantoprazole (PROTONIX) 40 MG tablet TAKE ONE TABLET BY MOUTH ONCE DAILY 30 tablet 11  . ranolazine (RANEXA) 1000 MG SR tablet Take 500 mg by mouth 2 (two) times daily.    Marland Kitchen venlafaxine (EFFEXOR-XR) 150 MG 24 hr capsule Take 150 mg by mouth at bedtime.     Marland Kitchen zolpidem (AMBIEN CR) 12.5 MG CR tablet Take 12.5 mg by mouth at bedtime as needed. For sleep    .  nitroGLYCERIN (NITROSTAT) 0.4 MG SL tablet Place 1 tablet (0.4 mg total) under the tongue every 5 (five) minutes x 3 doses as needed. For chest pain. (Patient not taking: Reported on 10/16/2016) 25 tablet 3   No current facility-administered medications for this encounter.    BP 125/61   Pulse 72   Wt 200 lb 8 oz (90.9 kg)   SpO2 96%   BMI 28.77 kg/m    Wt Readings from Last 3 Encounters:  01/16/17 200 lb 8 oz (90.9 kg)  10/16/16 198 lb (89.8 kg)  09/12/16 200 lb 4 oz (90.8 kg)  General: NAD Neck: JVP 8 cm, no thyromegaly or thyroid nodule.  Lungs: Clear to auscultation bilaterally with normal respiratory effort. CV: Nondisplaced PMI.  Heart regular S1/S2, no S3/S4, no murmur. 1+ ankle edema.  No carotid bruit.  Normal pedal pulses.  Abdomen: Soft, nontender, no hepatosplenomegaly, no distention.  Skin: Intact without lesions or rashes.  Neurologic: Alert and oriented x 3.  Psych: Normal affect. Extremities: No clubbing or cyanosis.  HEENT: Normal.   Assessment/Plan: 1. Chronic systolic CHF: Ischemic cardiomyopathy.  NYHA class II symptoms, stable.  EF 25-30% in 11/17 (stable). He has a Medtronic CRT-D device, >99% BiV pacing today.  NYHA class II symptoms.  Optivol suggests development of mild volume overload and weight is up 2 lbs.    - Continue eplerenone 25 mg daily - BP did not tolerate Entresto.  Continue lisinopril but take 5 mg dose in pm before bed and 2.5 mg dose in am to limit orthostasis.  - Increase Lasix to 60 mg bid x 2 days then back to 40 mg bid.  - Continue Coreg 25 mg bid. - I will refer him to cardiac rehab at [facility] to try to improve stamina.   2. CAD: s/p CABG and redo. Had DES to distal LM, angioplasty to ostial LCx in 4/17.  Cardiolite 11/17 with scar, no ischemia.  No recent chest pain.  - Continue ASA 81, Plavix, statin.  - Continue Coreg and ranolazine 500 mg bid.  3. CKD stage II-III: BMET today.  4. Hyperlipidemia: Good lipids 5/18.      Followup in 3 months.   [name] 01/16/2017

## 2017-01-26 ENCOUNTER — Encounter: Payer: Self-pay | Admitting: Internal Medicine

## 2017-01-26 ENCOUNTER — Ambulatory Visit (INDEPENDENT_AMBULATORY_CARE_PROVIDER_SITE_OTHER): Payer: Managed Care, Other (non HMO) | Admitting: Internal Medicine

## 2017-01-26 VITALS — BP 108/62 | HR 72 | Ht 70.0 in | Wt 198.2 lb

## 2017-01-26 DIAGNOSIS — I480 Paroxysmal atrial fibrillation: Secondary | ICD-10-CM | POA: Diagnosis not present

## 2017-01-26 DIAGNOSIS — I255 Ischemic cardiomyopathy: Secondary | ICD-10-CM

## 2017-01-26 DIAGNOSIS — I5022 Chronic systolic (congestive) heart failure: Secondary | ICD-10-CM | POA: Diagnosis not present

## 2017-01-26 DIAGNOSIS — I2589 Other forms of chronic ischemic heart disease: Secondary | ICD-10-CM

## 2017-01-26 NOTE — Patient Instructions (Addendum)
Medication Instructions:  Your physician recommends that you continue on your current medications as directed. Please refer to the Current Medication list given to you today.   Labwork: None Ordered   Testing/Procedures: None Ordered  Follow-Up: Your physician wants you to follow-up in: 1 year with Dr. Rayann Heman.  You will receive a reminder letter in the mail two months in advance. If you don't receive a letter, please call our office to schedule the follow-up appointment.  Remote monitoring is used to monitor your Pacemaker of ICD from home. This monitoring reduces the number of office visits required to check your device to one time per year. It allows [place] to keep an eye on the functioning of your device to ensure it is working properly. You are scheduled for a device check from home on 02/26/17 . You may send your transmission at any time that day. If you have a wireless device, the transmission will be sent automatically. After your physician reviews your transmission, you will receive a postcard with your next transmission date.   If you need a refill on your cardiac medications before your next appointment, please call your pharmacy.   Thank you for choosing [facility]! Christen Bame, RN [phone]

## 2017-01-26 NOTE — Progress Notes (Signed)
PCP: Mayra Neer, MD Primary Cardiologist: Primary EP: Dr Burke Keels is a 76 y.o. male who presents today for routine electrophysiology followup.  Since last being seen in our clinic, the patient reports doing reasonably well.  He as fatigue and is "slowing down".  He hasnt played golf in a year.  Today, he denies symptoms of palpitations, chest pain, shortness of breath,  lower extremity edema, dizziness, presyncope, syncope, or ICD shocks.  The patient is otherwise without complaint today.   Past Medical History:  Diagnosis Date  . CAD (coronary artery disease)    s/p CABG 1997, PCI (BMS) of SVG to RCA 3/09, redo CABG 02/2011 with SVG to PDA, SVG to Lcx, s/p cath 2.2013 with ocluded SVT to left circ and patent SVG to RCA.  Cath 07/2013 Recent cath showed ischemic cardiomyopathy with LVEF less than 20%, with progressive LV dysfunction related to bypass graft failure, occlusion of the saphenous vein grafts placed in 2012 to the right coronary and to the circum  . Chronic systolic dysfunction of left ventricle    EF 30%  . Depression   . DJD (degenerative joint disease)   . DM (diabetes mellitus) ([place]) 02/14/2011  . GERD (gastroesophageal reflux disease)   . Gout   . HTN (hypertension) 02/14/2011  . Hyperlipemia   . Ischemic cardiomyopathy    s/p ICD Implantation by Dr Leonia Reeves  . Paroxysmal atrial fibrillation ([facility]) 10/22/2014   single episode of AF x 3 hours 48 minutes recorded on ICD, chads2vasc score is at least 5    Past Surgical History:  Procedure Laterality Date  . BI-VENTRICULAR IMPLANTABLE CARDIOVERTER DEFIBRILLATOR UPGRADE N/A 08/24/2011   Procedure: BI-VENTRICULAR IMPLANTABLE CARDIOVERTER DEFIBRILLATOR UPGRADE;  Surgeon: Evans Lance, MD;  Location: [facility] CATH LAB;  Service: Cardiovascular;  Laterality: N/A;  . CARDIAC CATHETERIZATION  08/03/2015   Procedure: Left Heart Cath and Cors/Grafts Angiography;  Surgeon: Peter M Martinique, MD;  Location: [place] CV LAB;   Service: Cardiovascular;;  . CARDIAC CATHETERIZATION N/A 08/06/2015   Procedure: Coronary Stent Intervention w/Impella;  Surgeon: Jettie Booze, MD;  Location: [place] CV LAB;  Service: Cardiovascular;  Laterality: N/A;  . CARDIAC CATHETERIZATION  08/06/2015   Procedure: Left Heart Cath;  Surgeon: Jettie Booze, MD;  Location: [place] CV LAB;  Service: Cardiovascular;;  . CARDIAC CATHETERIZATION  08/06/2015   Procedure: Coronary Balloon Angioplasty;  Surgeon: Jettie Booze, MD;  Location: [place] CV LAB;  Service: Cardiovascular;;  . CARDIAC DEFIBRILLATOR PLACEMENT  08/2004   initial placement, upgraded to Bowen ICD by Dr Lovena Le 08/24/11 (MDT)  . CATARACT EXTRACTION W/ INTRAOCULAR LENS  IMPLANT, BILATERAL  2012  . CHOLECYSTECTOMY  01/05/2012   Procedure: LAPAROSCOPIC CHOLECYSTECTOMY WITH INTRAOPERATIVE CHOLANGIOGRAM;  Surgeon: Joyice Faster. Cornett, MD;  Location: [place];  Service: General;  Laterality: N/A;  laparoscopic cholecysectoym with intraoperative cholangiogram  . COLONOSCOPY WITH PROPOFOL N/A 11/18/2013   Procedure: COLONOSCOPY WITH PROPOFOL;  Surgeon: Garlan Fair, MD;  Location: [facility] ENDOSCOPY;  Service: Endoscopy;  Laterality: N/A;  . CORONARY ANGIOPLASTY WITH STENT PLACEMENT  06/2007   BMS to SVG to RCA  . CORONARY ARTERY BYPASS GRAFT  01/1996   CABG x 5 LIMA to LAD SVG to diag1,2,svg to om,svg to RCA  . CORONARY ARTERY BYPASS GRAFT  03/06/2011   CABG X2; Procedure: REDO CORONARY ARTERY BYPASS GRAFTING (CABG);  Surgeon: Grace Isaac, MD;  Location: [place];  Service: Open Heart Surgery;  Laterality: N/A;  times two grafts using right saphenous vein harvested endoscopically.  Marland Kitchen KNEE ARTHROTOMY  ~ 1978   RIGHT KNEE CARTILAGE REMOVED  . LEFT HEART CATHETERIZATION WITH CORONARY ANGIOGRAM N/A 06/06/2011   Procedure: LEFT HEART CATHETERIZATION WITH CORONARY ANGIOGRAM;  Surgeon: Sueanne Margarita, MD;  Location: [place] CATH LAB;  Service: Cardiovascular;  Laterality: N/A;  .  LEFT HEART CATHETERIZATION WITH CORONARY/GRAFT ANGIOGRAM N/A 08/08/2013   Procedure: LEFT HEART CATHETERIZATION WITH Beatrix Fetters;  Surgeon: Sinclair Grooms, MD;  Location: [facility] CATH LAB;  Service: Cardiovascular;  Laterality: N/A;  . LUMBAR [place] SURGERY  2003  . RIGHT HEART CATHETERIZATION N/A 02/17/2014   Procedure: RIGHT HEART CATH;  Surgeon: Larey Dresser, MD;  Location: [facility] CATH LAB;  Service: Cardiovascular;  Laterality: N/A;  . VENOGRAM N/A 06/08/2011   Procedure: VENOGRAM;  Surgeon: Thompson Grayer, MD;  Location: [facility] CATH LAB;  Service: Cardiovascular;  Laterality: N/A;    ROS- all systems are reviewed and negative except as per HPI above  Current Outpatient Prescriptions  Medication Sig Dispense Refill  . allopurinol (ZYLOPRIM) 100 MG tablet Take 100 mg by mouth 2 (two) times daily.     Marland Kitchen aspirin EC 81 MG tablet Take 81 mg by mouth daily.    Marland Kitchen atorvastatin (LIPITOR) 80 MG tablet TAKE 1 TABLET BY MOUTH DAILY AT 6:00 PM 90 tablet 6  . carvedilol (COREG) 25 MG tablet TAKE ONE TABLET BY MOUTH TWICE DAILY WITH A MEAL 180 tablet 3  . clopidogrel (PLAVIX) 75 MG tablet Take 1 tablet (75 mg total) by mouth daily. 90 tablet 3  . eplerenone (INSPRA) 25 MG tablet Take 1 tablet (25 mg total) by mouth every evening. 30 tablet 6  . furosemide (LASIX) 40 MG tablet Take 1 tablet (40 mg total) by mouth 2 (two) times daily. For 2 days Take 60 mg (1.5 tab), twice a day 75 tablet 2  . Insulin Lispro Prot & Lispro (HUMALOG MIX 50/50 KWIKPEN) (50-50) 100 UNIT/ML Kwikpen 15 units at breakfast and lunch, 30 units before supper.  Take insulin 10 minutes before eating 15 mL 0  . Insulin Pen Needle (RELION PEN NEEDLES) 32G X 4 MM MISC Use to inject insulin 3 times per day. 150 each 2  . insulin regular human CONCENTRATED (HUMULIN R U-500 KWIKPEN) 500 UNIT/ML kwikpen 30 units before BFST, 20 at lunch and 35 before supper, take 30-60 minutes before each meal 2 pen 3  . lisinopril (PRINIVIL,ZESTRIL) 5 MG  tablet Take 2.5 mg in the AM and 5mg  in the PM 45 tablet 3  . nitroGLYCERIN (NITROSTAT) 0.4 MG SL tablet Place 1 tablet (0.4 mg total) under the tongue every 5 (five) minutes x 3 doses as needed. For chest pain. 25 tablet 3  . ONE TOUCH ULTRA TEST test strip Use to check blood sugar 3 times per day. 150 each 3  . ONETOUCH DELICA LANCETS 20B MISC Use to check blood sugar 3 times per day dx code E11.9 100 each 3  . pantoprazole (PROTONIX) 40 MG tablet TAKE ONE TABLET BY MOUTH ONCE DAILY 30 tablet 11  . ranolazine (RANEXA) 1000 MG SR tablet Take 500 mg by mouth 2 (two) times daily.    Marland Kitchen venlafaxine (EFFEXOR-XR) 150 MG 24 hr capsule Take 150 mg by mouth at bedtime.     Marland Kitchen zolpidem (AMBIEN CR) 12.5 MG CR tablet Take 12.5 mg by mouth at bedtime as needed. For sleep     No current facility-administered medications for this  visit.     Physical Exam: Vitals:   01/26/17 1504  BP: 108/62  Pulse: 72  SpO2: 97%  Weight: 198 lb 3.2 oz (89.9 kg)  Height: 5\' 10"  (1.778 m)    GEN- The patient is well appearing, alert and oriented x 3 today.   Head- normocephalic, atraumatic Eyes-  Sclera clear, conjunctiva pink Ears- hearing intact Oropharynx- clear Lungs- Clear to ausculation bilaterally, normal work of breathing Chest- ICD pocket is well healed Heart- Regular rate and rhythm, no murmurs, rubs or gallops, PMI not laterally displaced GI- soft, NT, ND, + BS Extremities- no clubbing, cyanosis, or edema  ICD interrogation- reviewed in detail today,  See PACEART report   Assessment and Plan:  1.  Chronic systolic dysfunction euvolemic today Stable on an appropriate medical regimen Normal BiV ICD function See Pace Art report No changes today Followed by Sharman Cheek in [facility] device clinic Underwent AV optimization of his device 6/15.  I do not believe that we have any additional optimization options for his device optivol is slightly elevated today 2 gram sodium restiction is advised  Battery  voltage is 2.73.  He has probably 6-12 months to ERI.   Risks, benefits, and alternatives to ICD pulse generator replacement were discussed in detail today.   He can be scheduled for generator change once ERI without an office visit required.  2. HTN Stable No change required today  3. afib Well controlled (burden <0.1%)   carelink Return in 1 year to see me in the [place] office unless he reaches ERI in the interim.  Ok to proceed with generator change if he reaches ERI in the interim  Thompson Grayer MD, [facility] 01/26/2017 3:29 PM

## 2017-02-02 ENCOUNTER — Other Ambulatory Visit (HOSPITAL_COMMUNITY): Payer: Self-pay | Admitting: Cardiology

## 2017-02-06 ENCOUNTER — Other Ambulatory Visit: Payer: Self-pay | Admitting: Internal Medicine

## 2017-02-09 DIAGNOSIS — E1165 Type 2 diabetes mellitus with hyperglycemia: Secondary | ICD-10-CM | POA: Diagnosis not present

## 2017-02-09 DIAGNOSIS — R197 Diarrhea, unspecified: Secondary | ICD-10-CM | POA: Diagnosis not present

## 2017-02-11 ENCOUNTER — Other Ambulatory Visit: Payer: Self-pay | Admitting: Endocrinology

## 2017-02-19 ENCOUNTER — Encounter (HOSPITAL_COMMUNITY): Payer: Managed Care, Other (non HMO)

## 2017-02-26 ENCOUNTER — Ambulatory Visit (INDEPENDENT_AMBULATORY_CARE_PROVIDER_SITE_OTHER): Payer: Managed Care, Other (non HMO) | Admitting: *Deleted

## 2017-02-26 DIAGNOSIS — I255 Ischemic cardiomyopathy: Secondary | ICD-10-CM

## 2017-02-26 DIAGNOSIS — I5022 Chronic systolic (congestive) heart failure: Secondary | ICD-10-CM

## 2017-02-26 DIAGNOSIS — Z95810 Presence of automatic (implantable) cardiac defibrillator: Secondary | ICD-10-CM

## 2017-02-26 NOTE — Progress Notes (Signed)
EPIC Encounter for ICM Monitoring  Patient Name: [name] is a 76 y.o. male Date: 02/26/2017 Primary Care Physican: Mayra Neer, MD Primary Cardiologist:[place] Electrophysiologist: Allred Dry Weight: 192 lbs Bi-V Pacing: 97.3%      Heart Failure questions reviewed, pt reported feeling tired and weak.  He did not have any specific complaints.  Advised to call his physician to make an appointment if needed to check on his symptoms.     Optivol: Thoracic impedance normal.  Prescribed dosage: Furosemide 40 mg 1 tablet (40 mg total) by mouth 2 (two) times daily.   Labs: 09/21/2016 Creatinine 1.49, BUN 27, Potassium 4.1, Sodium 135, EGFR 44-52 09/14/2016 Creatinine 1.62, BUN 36, Potassium 4.5, Sodium 135, EGFR 42-51 09/12/2016 Creatinine 1.74, BUN 38, Potassium 5.0, Sodium 135, EGFR 37-43 08/29/2016 Creatinine 1.30, BUN 17, Potassium 2.9, Sodium 139, EGFR 53->60, BNP 391.5 06/14/2016 Creatinine 1.43, BUN 26, Potassium 4.0, Sodium 131, EGFR 47-55 05/25/2016 Creatinine 1.39, BUN 20, Potassium 3.7, Sodium 137, EGFR 49-56  10/13/2017Creatinine 1.31, BUN 19, Potassium 3.9, Sodium 138, EGFR 52->60  Recommendations: No changes.   Encouraged to call for fluid symptoms.  Follow-up plan: ICM clinic phone appointment on 03/29/2017.  Office appointment scheduled 04/18/2017 with Dr. Aundra Dubin.  Copy of ICM check sent to Dr. Rayann Heman.   3 month ICM trend: 02/26/2017    1 Year ICM trend:       [name], RN 02/26/2017 12:07 PM

## 2017-02-26 NOTE — Progress Notes (Signed)
Remote ICD transmission.   

## 2017-02-27 LAB — CUP PACEART REMOTE DEVICE CHECK
Brady Statistic AS VS Percent: 0.93 %
Brady Statistic RV Percent Paced: 97.29 %
HIGH POWER IMPEDANCE MEASURED VALUE: 44 Ohm
HighPow Impedance: 456 Ohm
HighPow Impedance: 53 Ohm
Implantable Lead Implant Date: 20111003
Implantable Lead Implant Date: 20130509
Implantable Lead Location: 753858
Implantable Lead Location: 753860
Implantable Lead Model: 5076
Implantable Pulse Generator Implant Date: 20130509
Lead Channel Impedance Value: 418 Ohm
Lead Channel Impedance Value: 684 Ohm
Lead Channel Pacing Threshold Amplitude: 0.625 V
Lead Channel Pacing Threshold Amplitude: 2.25 V
Lead Channel Pacing Threshold Pulse Width: 0.4 ms
Lead Channel Sensing Intrinsic Amplitude: 1.5 mV
Lead Channel Setting Pacing Amplitude: 1.75 V
Lead Channel Setting Sensing Sensitivity: 0.3 mV
MDC IDC LEAD IMPLANT DT: 20111003
MDC IDC LEAD LOCATION: 753859
MDC IDC MSMT BATTERY VOLTAGE: 2.68 V
MDC IDC MSMT LEADCHNL LV IMPEDANCE VALUE: 399 Ohm
MDC IDC MSMT LEADCHNL LV IMPEDANCE VALUE: 399 Ohm
MDC IDC MSMT LEADCHNL LV PACING THRESHOLD AMPLITUDE: 0.75 V
MDC IDC MSMT LEADCHNL LV PACING THRESHOLD PULSEWIDTH: 0.4 ms
MDC IDC MSMT LEADCHNL RA PACING THRESHOLD PULSEWIDTH: 0.4 ms
MDC IDC MSMT LEADCHNL RA SENSING INTR AMPL: 1.5 mV
MDC IDC MSMT LEADCHNL RV IMPEDANCE VALUE: 570 Ohm
MDC IDC MSMT LEADCHNL RV SENSING INTR AMPL: 31.625 mV
MDC IDC MSMT LEADCHNL RV SENSING INTR AMPL: 31.625 mV
MDC IDC SESS DTM: 20181112093825
MDC IDC SET LEADCHNL LV PACING PULSEWIDTH: 0.4 ms
MDC IDC SET LEADCHNL RA PACING AMPLITUDE: 3.5 V
MDC IDC SET LEADCHNL RV PACING AMPLITUDE: 2.5 V
MDC IDC SET LEADCHNL RV PACING PULSEWIDTH: 0.4 ms
MDC IDC STAT BRADY AP VP PERCENT: 0.73 %
MDC IDC STAT BRADY AP VS PERCENT: 0.01 %
MDC IDC STAT BRADY AS VP PERCENT: 98.34 %
MDC IDC STAT BRADY RA PERCENT PACED: 0.73 %

## 2017-03-02 ENCOUNTER — Encounter: Payer: Self-pay | Admitting: Cardiology

## 2017-03-05 ENCOUNTER — Telehealth: Payer: Self-pay | Admitting: Endocrinology

## 2017-03-06 NOTE — Telephone Encounter (Signed)
May refill only if he makes follow-up appointment

## 2017-03-06 NOTE — Telephone Encounter (Signed)
Unable to reach patient- no vm

## 2017-03-06 NOTE — Telephone Encounter (Signed)
Last OV was 03/2016. Please advise if refuse or refill.

## 2017-03-12 ENCOUNTER — Telehealth: Payer: Self-pay | Admitting: Cardiology

## 2017-03-12 ENCOUNTER — Telehealth (HOSPITAL_COMMUNITY): Payer: Self-pay | Admitting: Pharmacist

## 2017-03-12 NOTE — Telephone Encounter (Signed)
Pantoprazole PA denied by [place]. With GoodRx coupon, 30 tabs should be $11.75.   Ruta Hinds. Velva Harman, PharmD, BCPS, CPP Clinical Pharmacist Pager: [phone] Phone: [phone] 03/12/2017 9:39 AM

## 2017-03-12 NOTE — Telephone Encounter (Signed)
Patient called and stated that he believes he received therapies from his ICD. Pt stated that he feels fine. Instructed pt to send a remote transmission w/ his home monitor. Once the transmission is received a Chief Operating Officer will review and call him back w/ the results.

## 2017-03-13 ENCOUNTER — Ambulatory Visit (INDEPENDENT_AMBULATORY_CARE_PROVIDER_SITE_OTHER): Payer: Self-pay

## 2017-03-13 DIAGNOSIS — I5022 Chronic systolic (congestive) heart failure: Secondary | ICD-10-CM

## 2017-03-13 DIAGNOSIS — Z95810 Presence of automatic (implantable) cardiac defibrillator: Secondary | ICD-10-CM

## 2017-03-13 NOTE — Telephone Encounter (Signed)
Assisted Mr. Lawry with sending manual transmission.  Transmission received- no episodes, normal device function. Patient aware. He feels fine today, reports he was in his recliner when he felt he got shocked. Advised to call back if he experiences a shock or starts to feel poorly, he verbalizes understanding.

## 2017-03-13 NOTE — Progress Notes (Signed)
EPIC Encounter for ICM Monitoring  Patient Name: [name] is a 76 y.o. male Date: 03/13/2017 Primary Care Physican: Mayra Neer, MD Primary Cardiologist:[place] Electrophysiologist: Allred Dry Weight: 194 lbs Bi-V Pacing: 97.3%         Heart Failure questions reviewed, pt symptomatic with fatigue, bilateral ankle swelling and gain of 3 lbs in last week.  He was 191 lbs last week and 194 lbs today. Patient ate foods over the holiday that were high in salt.    Thoracic impedance abnormal suggesting fluid accumulation since 03/05/2017.  Patient called device clinic 11/26 to report not feeling well and asked to check report for shock and device clinic reviewed.  I reviewed the report sent today for fluid accumulation.  Prescribed dosage: Furosemide 40 mg 1 tablet (40 mg total) by mouth 2 (two) times daily. Confirmed today patient is taking this dose as prescribed.  Labs: 09/21/2016 Creatinine 1.49, BUN 27, Potassium 4.1, Sodium 135, EGFR 44-52 09/14/2016 Creatinine 1.62, BUN 36, Potassium 4.5, Sodium 135, EGFR 42-51 09/12/2016 Creatinine 1.74, BUN 38, Potassium 5.0, Sodium 135, EGFR 37-43 08/29/2016 Creatinine 1.30, BUN 17, Potassium 2.9, Sodium 139, EGFR 53->60, BNP 391.5 06/14/2016 Creatinine 1.43, BUN 26, Potassium 4.0, Sodium 131, EGFR 47-55 05/25/2016 Creatinine 1.39, BUN 20, Potassium 3.7, Sodium 137, EGFR 49-56  10/13/2017Creatinine 1.31, BUN 19, Potassium 3.9, Sodium 138, EGFR 52->60  Recommendations: Copy of ICM check sent to Dr. Rayann Heman and Dr. Aundra Dubin for review and recommendations if needed.     Follow-up plan: ICM clinic phone appointment on 03/16/2018 to recheck fluid levels.  Office appointment scheduled 04/18/2017 with Dr. Aundra Dubin.  3 month ICM trend: 03/13/2017    1 Year ICM trend:       [name], RN 03/13/2017 11:51 AM

## 2017-03-15 NOTE — Progress Notes (Signed)
Attempted call back to patient to check how he is feeling but no answer. Left message to return call.

## 2017-03-16 ENCOUNTER — Ambulatory Visit (INDEPENDENT_AMBULATORY_CARE_PROVIDER_SITE_OTHER): Payer: Self-pay

## 2017-03-16 DIAGNOSIS — Z95810 Presence of automatic (implantable) cardiac defibrillator: Secondary | ICD-10-CM

## 2017-03-16 DIAGNOSIS — I5022 Chronic systolic (congestive) heart failure: Secondary | ICD-10-CM

## 2017-03-16 MED ORDER — FUROSEMIDE 40 MG PO TABS: ORAL_TABLET | ORAL | 3 refills | Status: DC

## 2017-03-16 NOTE — Addendum Note (Signed)
Addended by: [name] on: 03/16/2017 05:05 PM   Modules accepted: Orders

## 2017-03-16 NOTE — Progress Notes (Signed)
EPIC Encounter for ICM Monitoring  Patient Name: [name] is a 76 y.o. male Date: 03/16/2017 Primary Care Physican: Mayra Neer, MD Primary Cardiologist:[place] Electrophysiologist: Allred Dry Weight: 193lbs Bi-V Pacing: 98.6%       Heart Failure questions reviewed, pt reported fluid symptoms fatigue and ankle/leg swelling have improved.  Weight is still up about 3 lbs from baseline last week which was 191 lbs.   Thoracic impedance abnormal suggesting fluid accumulation since 03/05/17 but has improved since 03/12/17 remote transmission.  Prescribed dosage: Furosemide 40 mg 1 tablet (40 mg total) by mouth 2 (two) times daily.   Labs: 09/21/2016 Creatinine 1.49, BUN 27, Potassium 4.1, Sodium 135, EGFR 44-52 09/14/2016 Creatinine 1.62, BUN 36, Potassium 4.5, Sodium 135, EGFR 42-51 09/12/2016 Creatinine 1.74, BUN 38, Potassium 5.0, Sodium 135, EGFR 37-43 08/29/2016 Creatinine 1.30, BUN 17, Potassium 2.9, Sodium 139, EGFR 53->60, BNP 391.5 06/14/2016 Creatinine 1.43, BUN 26, Potassium 4.0, Sodium 131, EGFR 47-55 05/25/2016 Creatinine 1.39, BUN 20, Potassium 3.7, Sodium 137, EGFR 49-56  10/13/2017Creatinine 1.31, BUN 19, Potassium 3.9, Sodium 138, EGFR 52->60  Recommendations: No changes. He reported decreasing fluid and salt intake since Monday.  Reminded him again of restrictions and to continue to decrease salt intake to 2000 mg daily. Encouraged to call if fluid symptoms worsen.  Follow-up plan: ICM clinic phone appointment on 03/29/2017.  Office appointment scheduled 1/2/2019with Dr. Aundra Dubin.  Copy of ICM check sent to Dr. Rayann Heman and Dr. Aundra Dubin for review.   3 month ICM trend: 03/16/2017    1 Year ICM trend:       [name], RN 03/16/2017 9:32 AM

## 2017-03-16 NOTE — Progress Notes (Signed)
Call to patient.  Advised Dr Aundra Dubin increased Lasix to 60 mg twice a day.  BMET scheduled for 03/23/2017 and advised he needs to arrive before 11:00 AM.  He verbalized and repeated instructions.

## 2017-03-16 NOTE — Progress Notes (Signed)
Increase Lasix to 60 mg bid, BMET in 1 week.

## 2017-03-19 NOTE — Progress Notes (Signed)
Dr Aundra Dubin increased Lasix to 60 mg twice a day.  BMET scheduled for 03/23/2017 and patient was called on 03/16/2017 with instructions

## 2017-03-20 ENCOUNTER — Telehealth (HOSPITAL_COMMUNITY): Payer: Self-pay

## 2017-03-20 NOTE — Telephone Encounter (Signed)
Left VM

## 2017-03-20 NOTE — Telephone Encounter (Signed)
      Author: Larey Dresser, MD Service: Heart Failure Author Type: Physician  Filed: 03/16/2017 4:10 PM Encounter Date: 03/13/2017 Status: Signed  Editor: Larey Dresser, MD (Physician)       [] Hide copied text  [] Hover for details   Increase Lasix to 60 mg bid, BMET in 1 week.

## 2017-03-23 ENCOUNTER — Ambulatory Visit (HOSPITAL_COMMUNITY)
Admission: RE | Admit: 2017-03-23 | Discharge: 2017-03-23 | Disposition: A | Discharge status: Home / Self Care | Payer: Managed Care, Other (non HMO) | Source: Ambulatory Visit | Attending: Internal Medicine | Admitting: Internal Medicine

## 2017-03-23 DIAGNOSIS — Z95810 Presence of automatic (implantable) cardiac defibrillator: Secondary | ICD-10-CM

## 2017-03-23 DIAGNOSIS — I5022 Chronic systolic (congestive) heart failure: Secondary | ICD-10-CM | POA: Diagnosis present

## 2017-03-23 LAB — BASIC METABOLIC PANEL
Anion gap: 10 (ref 5–15)
BUN: 21 mg/dL — AB (ref 6–20)
CHLORIDE: 100 mmol/L — AB (ref 101–111)
CO2: 29 mmol/L (ref 22–32)
CREATININE: 1.44 mg/dL — AB (ref 0.61–1.24)
Calcium: 9 mg/dL (ref 8.9–10.3)
GFR calc Af Amer: 54 mL/min — ABNORMAL LOW (ref >60)
GFR calc non Af Amer: 46 mL/min — ABNORMAL LOW (ref >60)
GLUCOSE: 174 mg/dL — AB (ref 65–99)
POTASSIUM: 4.1 mmol/L (ref 3.5–5.1)
SODIUM: 139 mmol/L (ref 135–145)

## 2017-03-23 NOTE — Progress Notes (Signed)
Increase Lasix to 60 mg bid with repeat BMET 1 week.

## 2017-03-29 ENCOUNTER — Ambulatory Visit (INDEPENDENT_AMBULATORY_CARE_PROVIDER_SITE_OTHER): Payer: Managed Care, Other (non HMO)

## 2017-03-29 ENCOUNTER — Telehealth: Payer: Self-pay | Admitting: Cardiology

## 2017-03-29 DIAGNOSIS — Z95810 Presence of automatic (implantable) cardiac defibrillator: Secondary | ICD-10-CM | POA: Diagnosis not present

## 2017-03-29 DIAGNOSIS — I5022 Chronic systolic (congestive) heart failure: Secondary | ICD-10-CM | POA: Diagnosis not present

## 2017-03-29 NOTE — Telephone Encounter (Signed)
Spoke with pt and reminded pt of remote transmission that is due today. Pt verbalized understanding.   

## 2017-03-29 NOTE — Progress Notes (Signed)
EPIC Encounter for ICM Monitoring  Patient Name: [name] is a 76 y.o. male Date: 03/29/2017 Primary Care Physican: Mayra Neer, MD Primary Cardiologist:[place] Electrophysiologist: Allred Dry Weight:195lbs ( baseline 191 -192 lbs) Bi-V Pacing: 98.6%      Heart Failure questions reviewed, pt's weight increased from 194 lbs on 03/13/2017 to 195 lbs.  He reported leg swelling has improved since Furosemide was increased.    Thoracic impedance has improved and trending toward baseline after increasing long term dosage of Furosemide on 03/16/2017.     Prescribed dosage: Furosemide 40 mg 1.5 tablets (60 mg total) by mouth 2 (two) times daily. Confirmed today he is taking this dosage.  Labs: 03/23/2017 Creatinine 1.44, BUN 21, Potassium 4.1, Sodium 139, EGFR 46-54 01/16/2017 Creatinine 1.28, BUN 18, Potassium 3.5, Sodium 137, EGFR 53->60 09/21/2016 Creatinine 1.49, BUN 27, Potassium 4.1, Sodium 135, EGFR 44-52 09/14/2016 Creatinine 1.62, BUN 36, Potassium 4.5, Sodium 135, EGFR 42-51 09/12/2016 Creatinine 1.74, BUN 38, Potassium 5.0, Sodium 135, EGFR 37-43 08/29/2016 Creatinine 1.30, BUN 17, Potassium 2.9, Sodium 139, EGFR 53->60, BNP 391.5 06/14/2016 Creatinine 1.43, BUN 26, Potassium 4.0, Sodium 131, EGFR 47-55 05/25/2016 Creatinine 1.39, BUN 20, Potassium 3.7, Sodium 137, EGFR 49-56  10/13/2017Creatinine 1.31, BUN 19, Potassium 3.9, Sodium 138, EGFR 52->60  Recommendations: Discussed salt limitations and he reported he does review food labels for sodium content.  Encouraged to call for fluid symptoms.  Follow-up plan: ICM clinic phone appointment on 04/30/2017.  Office appointment scheduled 04/18/2017 with Dr. Aundra Dubin.  Copy of ICM check sent to Dr. Rayann Heman and Dr. Aundra Dubin for review and recommendations if needed.   3 month ICM trend: 03/29/2017    1 Year ICM trend:       [name], RN 03/29/2017 3:02 PM

## 2017-04-18 ENCOUNTER — Encounter (HOSPITAL_COMMUNITY): Payer: Self-pay | Admitting: Cardiology

## 2017-04-18 ENCOUNTER — Ambulatory Visit (HOSPITAL_COMMUNITY)
Admission: RE | Admit: 2017-04-18 | Discharge: 2017-04-18 | Disposition: A | Discharge status: Home / Self Care | Payer: Medicare HMO | Source: Ambulatory Visit | Attending: Cardiology | Admitting: Cardiology

## 2017-04-18 VITALS — BP 106/62 | HR 66 | Wt 200.4 lb

## 2017-04-18 DIAGNOSIS — Z7982 Long term (current) use of aspirin: Secondary | ICD-10-CM | POA: Diagnosis not present

## 2017-04-18 DIAGNOSIS — Z794 Long term (current) use of insulin: Secondary | ICD-10-CM | POA: Diagnosis not present

## 2017-04-18 DIAGNOSIS — N183 Chronic kidney disease, stage 3 (moderate): Secondary | ICD-10-CM | POA: Insufficient documentation

## 2017-04-18 DIAGNOSIS — I5022 Chronic systolic (congestive) heart failure: Secondary | ICD-10-CM

## 2017-04-18 DIAGNOSIS — I480 Paroxysmal atrial fibrillation: Secondary | ICD-10-CM

## 2017-04-18 DIAGNOSIS — Z95810 Presence of automatic (implantable) cardiac defibrillator: Secondary | ICD-10-CM

## 2017-04-18 DIAGNOSIS — Z951 Presence of aortocoronary bypass graft: Secondary | ICD-10-CM | POA: Insufficient documentation

## 2017-04-18 DIAGNOSIS — E1122 Type 2 diabetes mellitus with diabetic chronic kidney disease: Secondary | ICD-10-CM | POA: Insufficient documentation

## 2017-04-18 DIAGNOSIS — Z79899 Other long term (current) drug therapy: Secondary | ICD-10-CM | POA: Insufficient documentation

## 2017-04-18 DIAGNOSIS — Z7902 Long term (current) use of antithrombotics/antiplatelets: Secondary | ICD-10-CM | POA: Insufficient documentation

## 2017-04-18 DIAGNOSIS — I2510 Atherosclerotic heart disease of native coronary artery without angina pectoris: Secondary | ICD-10-CM | POA: Diagnosis not present

## 2017-04-18 DIAGNOSIS — I255 Ischemic cardiomyopathy: Secondary | ICD-10-CM | POA: Diagnosis not present

## 2017-04-18 DIAGNOSIS — E785 Hyperlipidemia, unspecified: Secondary | ICD-10-CM | POA: Diagnosis not present

## 2017-04-18 MED ORDER — FUROSEMIDE 40 MG PO TABS: ORAL_TABLET | ORAL | 3 refills | Status: DC

## 2017-04-18 NOTE — Progress Notes (Signed)
Advanced Heart Failure Clinic Note   Patient ID: [name], male   DOB: 12/17/1940, 77 y.o.   MRN: [id number] PCP: Dr. Lynnda Child Cardiology: Dr Aundra Dubin  71 yo with history of CAD s/p CABG and redo CABG as well as ischemic cardiomyopathy with CRT-D device presents for followup of CHF and CAD.  He had a Cardiolite in the 4/15 showing lateral wall ischemia.  LHC at that time showed all his vein grafts from both CABG surgeries occluded.  The LIMA-LAD was patent and there was a 90% distal LM stenosis.  The lateral ischemia likely corresponded to LCx territory downstream from the LM stenosis.  PCI of the distal LM was thought to be high risk and characteristics of the lesion were not favorable for PCI.  Last echo showed EF 25-30% with moderate RV systolic dysfunction.  He had RHC in 11/15 with normal filling pressures and relatively preserved cardiac index (2.55).    In 4/17, he was admitted with chest pain concerning for unstable angina.  Angiography showed 85% distal LM with 90% ostial LCx stenosis.  LIMA was patent but proximal LAD, proximal RCA, and all SVGs were totally occluded. High had successful DES to LM and PTCA to ostial LCx with Impella support.  [name] was stopped and he was put back on low dose lisinopril due to symptomatic hypotension.  He had Cardiolite in 11/17 with scar, no ischemia.   He returns for followup of CHF and CAD.  Stable symptomatically.  Can walk 1/4 mile on flat ground before getting short of breath/fatigued.  No chest pain.  He can climb a flight of steps without problems.  Weight is stable.   Medtronic device interrogation: Fluid index > threshold with lower impedance x 1 month.  Possible episode of atrial fibrillation x 1 but lasted < 10 minutes.    Labs (9/15): LDL particle number 816, LDL 57, LFTs normal Labs (10/15): K 4.4, creatinine 1.4 Labs (11/15): K 4.3, creatinine 1.36 Labs (12/15): K 4.8, creatinine 1.34 Labs (3/16): K 4, creatinine 1.38, LDL 46, HDl  35 Labs (7/16): K 4, creatinine 1.39, BNP 211 Labs (03/03/15): K 4.1, creatinine 1.47, BNP 227, LDL 80, HDL 32 Labs (12/16): K 4.6, creatinine 1.12, LDL 68, HDL 34 Labs (4/17): K 3.4, creatinine 1.15 Labs (6/17): K 3.8, creatinine 1.67 Labs (7/17): K 4, creatinine 1.35 Labs (10/17): K 3.9, creatinine 1.31, LDL 45, HDL 30 Labs (2/18): K 4, creatinine 1.43 Labs (5/18): LDL 44, HDL 27 Labs (6/18): K 4.1, creatinine 1.49 Labs (12/18): K 4.4, creatinine 1.44  PMH: 1. Gout 2. Hyperlipidemia 3. CAD: CABG 1997 and redo 11/12.   - LHC (4/15) with totally occluded LAD, totally occluded RCA, 80% distal LM, 50% mLCx, 2 SVG-RCA grafts totally occluded, 2 SVG-OM grafts totally occluded, patent LIMA-LAD.  Cardiolite prior to 4/15 cath showed lateral wall ischemia (LCx territory).  PCI to distal LM would be a high risk procedure.   - Cardiolite (8/16) with EF 20%, severe scar in RCA and probably LCx territory, minimal peri-infarct ischemia.   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  - Unstable angina 4/17: 85% distal LM with 90% ostial LCx stenosis.  LIMA was patent but proximal LAD, proximal RCA, and all SVGs were totally occluded. He had successful DES to LM and PTCA to ostial LCx with Impella support  - Cardiolite (11/17): EF 20%, prior MI, no ischemia.  4. Ischemic Cardiomyopathy: Medtronic CRT-D device.   - Echo (6/15) with  EF 25-30%, moderate LV dilation, inferior and inferolateral akinesis, moderately decreased RV systolic function, mild MR.   - RHC (11/15) with mean RA 5, PA 23/6, mean PCWP 9, CI 2.55.  - Echo (4/17): EF 25-30% with mild MR.  - Echo (11/17): EF 25-30%, moderately dilated LV, grade II diastolic dysfunction, mild-moderate MR, mildly decreased RV systolic function.  5. H/o cholecystectomy 6. OA 7. Depression 8. Type II diabetes 9. GERD 10. CKD  SH: Married, prior smoker (many years ago), lives in [place].    FH: CAD  ROS: All systems  reviewed and negative except as per HPI.   Current Outpatient Medications  Medication Sig Dispense Refill  . allopurinol (ZYLOPRIM) 100 MG tablet Take 100 mg by mouth 2 (two) times daily.     Marland Kitchen aspirin EC 81 MG tablet Take 81 mg by mouth daily.    Marland Kitchen atorvastatin (LIPITOR) 80 MG tablet TAKE 1 TABLET BY MOUTH DAILY AT 6:00 PM 90 tablet 6  . carvedilol (COREG) 25 MG tablet Take 1 tablet (25 mg total) by mouth 2 (two) times daily with a meal. 60 tablet 5  . clopidogrel (PLAVIX) 75 MG tablet Take 1 tablet (75 mg total) by mouth daily. 90 tablet 3  . eplerenone (INSPRA) 25 MG tablet Take 1 tablet (25 mg total) by mouth every evening. 30 tablet 6  . furosemide (LASIX) 40 MG tablet Take 80 mg (2 tabs) in AM, then 60 mg (1.5 tabs) in PM 105 tablet 3  . Insulin Lispro Prot & Lispro (HUMALOG MIX 50/50 KWIKPEN) (50-50) 100 UNIT/ML Kwikpen 15 units at breakfast and lunch, 30 units before supper.  Take insulin 10 minutes before eating 15 mL 0  . Insulin Pen Needle (RELION PEN NEEDLES) 32G X 4 MM MISC Use to inject insulin 3 times per day. 150 each 2  . insulin regular human CONCENTRATED (HUMULIN R U-500 KWIKPEN) 500 UNIT/ML kwikpen 30 units before BFST, 20 at lunch and 35 before supper, take 30-60 minutes before each meal 2 pen 3  . lisinopril (PRINIVIL,ZESTRIL) 5 MG tablet Take 2.5 mg in the AM and 5mg  in the PM 45 tablet 3  . nitroGLYCERIN (NITROSTAT) 0.4 MG SL tablet Place 1 tablet (0.4 mg total) under the tongue every 5 (five) minutes x 3 doses as needed. For chest pain. 25 tablet 3  . ONE TOUCH ULTRA TEST test strip Use to check blood sugar 3 times per day. 150 each 3  . ONETOUCH DELICA LANCETS 17E MISC Use to check blood sugar 3 times per day dx code E11.9 100 each 3  . pantoprazole (PROTONIX) 40 MG tablet TAKE ONE TABLET BY MOUTH ONCE DAILY 30 tablet 11  . ranolazine (RANEXA) 1000 MG SR tablet Take 500 mg by mouth 2 (two) times daily.    Marland Kitchen venlafaxine (EFFEXOR-XR) 150 MG 24 hr capsule Take 150 mg by  mouth at bedtime.     Marland Kitchen zolpidem (AMBIEN CR) 12.5 MG CR tablet Take 12.5 mg by mouth at bedtime as needed. For sleep     No current facility-administered medications for this encounter.    BP 106/62   Pulse 66   Wt 200 lb 6.4 oz (90.9 kg)   SpO2 96%   BMI 28.75 kg/m    Wt Readings from Last 3 Encounters:  04/18/17 200 lb 6.4 oz (90.9 kg)  01/26/17 198 lb 3.2 oz (89.9 kg)  01/16/17 200 lb 8 oz (90.9 kg)    General: NAD Neck: No JVD, no thyromegaly  or thyroid nodule.  Lungs: Clear to auscultation bilaterally with normal respiratory effort. CV: Nondisplaced PMI.  Heart regular S1/S2, no S3/S4, no murmur.  No peripheral edema.  No carotid bruit.  Normal pedal pulses.  Abdomen: Soft, nontender, no hepatosplenomegaly, no distention.  Skin: Intact without lesions or rashes.  Neurologic: Alert and oriented x 3.  Psych: Normal affect. Extremities: No clubbing or cyanosis.  HEENT: Normal.   Assessment/Plan: 1. Chronic systolic CHF: Ischemic cardiomyopathy.  NYHA class II symptoms, stable.  EF 25-30% in 11/17 (stable). He has a Medtronic CRT-D device, >99% BiV pacing today.  NYHA class II symptoms.  Optivol suggests volume overload over the last month with decreased impedance.  He does not look particularly volume overloaded by exam. - Continue eplerenone 25 mg daily - BP did not tolerate Entresto.  Continue lisinopril but take 5 mg dose in pm before bed and 2.5 mg dose in am to limit orthostasis.  - Increase Lasix to 80 qam/60 qpm. BMET in 10 days.  - Continue Coreg 25 mg bid. - Repeat echo at followup.  2. CAD: s/p CABG and redo. Had DES to distal LM, angioplasty to ostial LCx in 4/17.  Cardiolite 11/17 with scar, no ischemia.  No recent chest pain.  - Continue ASA 81, Plavix, statin.  - Continue Coreg and ranolazine 500 mg bid.  3. CKD stage II-III: BMET today.  4. Hyperlipidemia: Good lipids 5/18.    5. Atrial fibrillation: Possible < 10 minute run afib on device interrogation.   Burden not significant enough to anticoagulate, will continue to follow.   Followup in 4 months with echo   [name] 04/18/2017

## 2017-04-18 NOTE — Patient Instructions (Signed)
Increase Furosemide 80 mg (2 tabs) in AM, then 60 mg (1.5 tabs) in PM   Your physician recommends that you return for lab work in: 10 days  Your physician has requested that you have an echocardiogram. Echocardiography is a painless test that uses sound waves to create images of your heart. It provides your doctor with information about the size and shape of your heart and how well your heart's chambers and valves are working. This procedure takes approximately one hour. There are no restrictions for this procedure.  Your physician recommends that you schedule a follow-up appointment in: 4 months with Dr. Aundra Dubin  an a echocardiogram

## 2017-04-30 ENCOUNTER — Other Ambulatory Visit: Payer: Self-pay | Admitting: Cardiology

## 2017-04-30 ENCOUNTER — Ambulatory Visit (INDEPENDENT_AMBULATORY_CARE_PROVIDER_SITE_OTHER): Payer: Medicare HMO

## 2017-04-30 DIAGNOSIS — I5022 Chronic systolic (congestive) heart failure: Secondary | ICD-10-CM | POA: Diagnosis not present

## 2017-04-30 DIAGNOSIS — Z95810 Presence of automatic (implantable) cardiac defibrillator: Secondary | ICD-10-CM

## 2017-05-01 ENCOUNTER — Emergency Department (HOSPITAL_COMMUNITY): Payer: Medicare HMO

## 2017-05-01 ENCOUNTER — Inpatient Hospital Stay (HOSPITAL_COMMUNITY)
Admission: EM | Admit: 2017-05-01 | Discharge: 2017-05-04 | DRG: 682 | Disposition: A | Discharge status: Home Health | Payer: Medicare HMO | Attending: Internal Medicine | Admitting: Internal Medicine

## 2017-05-01 ENCOUNTER — Ambulatory Visit (HOSPITAL_COMMUNITY)
Admission: RE | Admit: 2017-05-01 | Discharge: 2017-05-01 | Disposition: A | Discharge status: Home / Self Care | Payer: Medicare HMO | Source: Ambulatory Visit | Attending: Cardiology | Admitting: Cardiology

## 2017-05-01 ENCOUNTER — Telehealth: Payer: Self-pay

## 2017-05-01 ENCOUNTER — Other Ambulatory Visit: Payer: Self-pay

## 2017-05-01 DIAGNOSIS — E1122 Type 2 diabetes mellitus with diabetic chronic kidney disease: Secondary | ICD-10-CM

## 2017-05-01 DIAGNOSIS — Z9049 Acquired absence of other specified parts of digestive tract: Secondary | ICD-10-CM

## 2017-05-01 DIAGNOSIS — I959 Hypotension, unspecified: Secondary | ICD-10-CM | POA: Diagnosis present

## 2017-05-01 DIAGNOSIS — M1990 Unspecified osteoarthritis, unspecified site: Secondary | ICD-10-CM | POA: Diagnosis present

## 2017-05-01 DIAGNOSIS — F329 Major depressive disorder, single episode, unspecified: Secondary | ICD-10-CM | POA: Diagnosis present

## 2017-05-01 DIAGNOSIS — I255 Ischemic cardiomyopathy: Secondary | ICD-10-CM | POA: Diagnosis present

## 2017-05-01 DIAGNOSIS — N183 Chronic kidney disease, stage 3 (moderate): Secondary | ICD-10-CM | POA: Diagnosis not present

## 2017-05-01 DIAGNOSIS — G4700 Insomnia, unspecified: Secondary | ICD-10-CM | POA: Diagnosis present

## 2017-05-01 DIAGNOSIS — M109 Gout, unspecified: Secondary | ICD-10-CM | POA: Diagnosis present

## 2017-05-01 DIAGNOSIS — Z885 Allergy status to narcotic agent status: Secondary | ICD-10-CM

## 2017-05-01 DIAGNOSIS — Z95810 Presence of automatic (implantable) cardiac defibrillator: Secondary | ICD-10-CM

## 2017-05-01 DIAGNOSIS — N1330 Unspecified hydronephrosis: Secondary | ICD-10-CM | POA: Diagnosis present

## 2017-05-01 DIAGNOSIS — G9341 Metabolic encephalopathy: Secondary | ICD-10-CM | POA: Diagnosis present

## 2017-05-01 DIAGNOSIS — K219 Gastro-esophageal reflux disease without esophagitis: Secondary | ICD-10-CM | POA: Diagnosis present

## 2017-05-01 DIAGNOSIS — N320 Bladder-neck obstruction: Secondary | ICD-10-CM | POA: Diagnosis present

## 2017-05-01 DIAGNOSIS — E785 Hyperlipidemia, unspecified: Secondary | ICD-10-CM | POA: Diagnosis not present

## 2017-05-01 DIAGNOSIS — N179 Acute kidney failure, unspecified: Principal | ICD-10-CM | POA: Diagnosis present

## 2017-05-01 DIAGNOSIS — Z955 Presence of coronary angioplasty implant and graft: Secondary | ICD-10-CM

## 2017-05-01 DIAGNOSIS — R1111 Vomiting without nausea: Secondary | ICD-10-CM | POA: Diagnosis not present

## 2017-05-01 DIAGNOSIS — I4581 Long QT syndrome: Secondary | ICD-10-CM | POA: Diagnosis present

## 2017-05-01 DIAGNOSIS — I5022 Chronic systolic (congestive) heart failure: Secondary | ICD-10-CM | POA: Diagnosis not present

## 2017-05-01 DIAGNOSIS — T502X5A Adverse effect of carbonic-anhydrase inhibitors, benzothiadiazides and other diuretics, initial encounter: Secondary | ICD-10-CM | POA: Diagnosis present

## 2017-05-01 DIAGNOSIS — I10 Essential (primary) hypertension: Secondary | ICD-10-CM

## 2017-05-01 DIAGNOSIS — I5042 Chronic combined systolic (congestive) and diastolic (congestive) heart failure: Secondary | ICD-10-CM | POA: Diagnosis not present

## 2017-05-01 DIAGNOSIS — R410 Disorientation, unspecified: Secondary | ICD-10-CM

## 2017-05-01 DIAGNOSIS — E11649 Type 2 diabetes mellitus with hypoglycemia without coma: Secondary | ICD-10-CM | POA: Diagnosis present

## 2017-05-01 DIAGNOSIS — I130 Hypertensive heart and chronic kidney disease with heart failure and stage 1 through stage 4 chronic kidney disease, or unspecified chronic kidney disease: Secondary | ICD-10-CM | POA: Diagnosis not present

## 2017-05-01 DIAGNOSIS — Z951 Presence of aortocoronary bypass graft: Secondary | ICD-10-CM

## 2017-05-01 DIAGNOSIS — E861 Hypovolemia: Secondary | ICD-10-CM | POA: Diagnosis present

## 2017-05-01 DIAGNOSIS — I480 Paroxysmal atrial fibrillation: Secondary | ICD-10-CM | POA: Diagnosis not present

## 2017-05-01 DIAGNOSIS — Z66 Do not resuscitate: Secondary | ICD-10-CM | POA: Diagnosis present

## 2017-05-01 DIAGNOSIS — R4182 Altered mental status, unspecified: Secondary | ICD-10-CM | POA: Diagnosis not present

## 2017-05-01 DIAGNOSIS — Z794 Long term (current) use of insulin: Secondary | ICD-10-CM | POA: Diagnosis not present

## 2017-05-01 DIAGNOSIS — Z7982 Long term (current) use of aspirin: Secondary | ICD-10-CM

## 2017-05-01 DIAGNOSIS — Z91040 Latex allergy status: Secondary | ICD-10-CM

## 2017-05-01 DIAGNOSIS — I2510 Atherosclerotic heart disease of native coronary artery without angina pectoris: Secondary | ICD-10-CM | POA: Diagnosis not present

## 2017-05-01 DIAGNOSIS — Z7902 Long term (current) use of antithrombotics/antiplatelets: Secondary | ICD-10-CM

## 2017-05-01 DIAGNOSIS — I252 Old myocardial infarction: Secondary | ICD-10-CM

## 2017-05-01 DIAGNOSIS — E119 Type 2 diabetes mellitus without complications: Secondary | ICD-10-CM

## 2017-05-01 DIAGNOSIS — Z809 Family history of malignant neoplasm, unspecified: Secondary | ICD-10-CM

## 2017-05-01 DIAGNOSIS — Z87891 Personal history of nicotine dependence: Secondary | ICD-10-CM

## 2017-05-01 DIAGNOSIS — Z8249 Family history of ischemic heart disease and other diseases of the circulatory system: Secondary | ICD-10-CM

## 2017-05-01 DIAGNOSIS — I6789 Other cerebrovascular disease: Secondary | ICD-10-CM | POA: Diagnosis not present

## 2017-05-01 DIAGNOSIS — R4781 Slurred speech: Secondary | ICD-10-CM | POA: Diagnosis not present

## 2017-05-01 LAB — CBC WITH DIFFERENTIAL/PLATELET
Basophils Absolute: 0 10*3/uL (ref 0.0–0.1)
Basophils Relative: 0 %
Eosinophils Absolute: 0.2 10*3/uL (ref 0.0–0.7)
Eosinophils Relative: 1 %
HEMATOCRIT: 41.8 % (ref 39.0–52.0)
HEMOGLOBIN: 13.9 g/dL (ref 13.0–17.0)
LYMPHS ABS: 1.7 10*3/uL (ref 0.7–4.0)
LYMPHS PCT: 13 %
MCH: 29.8 pg (ref 26.0–34.0)
MCHC: 33.3 g/dL (ref 30.0–36.0)
MCV: 89.5 fL (ref 78.0–100.0)
Monocytes Absolute: 1 10*3/uL (ref 0.1–1.0)
Monocytes Relative: 7 %
NEUTROS ABS: 10.7 10*3/uL — AB (ref 1.7–7.7)
NEUTROS PCT: 79 %
Platelets: 303 10*3/uL (ref 150–400)
RBC: 4.67 MIL/uL (ref 4.22–5.81)
RDW: 15.8 % — ABNORMAL HIGH (ref 11.5–15.5)
WBC: 13.6 10*3/uL — AB (ref 4.0–10.5)

## 2017-05-01 LAB — COMPREHENSIVE METABOLIC PANEL
ALT: 25 U/L (ref 17–63)
AST: 22 U/L (ref 15–41)
Albumin: 4.3 g/dL (ref 3.5–5.0)
Alkaline Phosphatase: 102 U/L (ref 38–126)
Anion gap: 15 (ref 5–15)
BUN: 91 mg/dL — AB (ref 6–20)
CHLORIDE: 94 mmol/L — AB (ref 101–111)
CO2: 20 mmol/L — ABNORMAL LOW (ref 22–32)
Calcium: 9.7 mg/dL (ref 8.9–10.3)
Creatinine, Ser: 3.11 mg/dL — ABNORMAL HIGH (ref 0.61–1.24)
GFR, EST AFRICAN AMERICAN: 21 mL/min — AB (ref >60)
GFR, EST NON AFRICAN AMERICAN: 18 mL/min — AB (ref >60)
Glucose, Bld: 309 mg/dL — ABNORMAL HIGH (ref 65–99)
POTASSIUM: 5.2 mmol/L — AB (ref 3.5–5.1)
Sodium: 129 mmol/L — ABNORMAL LOW (ref 135–145)
Total Bilirubin: 0.8 mg/dL (ref 0.3–1.2)
Total Protein: 8 g/dL (ref 6.5–8.1)

## 2017-05-01 LAB — URINALYSIS, ROUTINE W REFLEX MICROSCOPIC
Bilirubin Urine: NEGATIVE
GLUCOSE, UA: 50 mg/dL — AB
HGB URINE DIPSTICK: NEGATIVE
Ketones, ur: NEGATIVE mg/dL
Leukocytes, UA: NEGATIVE
Nitrite: NEGATIVE
Protein, ur: NEGATIVE mg/dL
SPECIFIC GRAVITY, URINE: 1.009 (ref 1.005–1.030)
pH: 5 (ref 5.0–8.0)

## 2017-05-01 LAB — I-STAT CHEM 8, ED
BUN: 79 mg/dL — ABNORMAL HIGH (ref 6–20)
CALCIUM ION: 1.13 mmol/L — AB (ref 1.15–1.40)
CHLORIDE: 97 mmol/L — AB (ref 101–111)
Creatinine, Ser: 3 mg/dL — ABNORMAL HIGH (ref 0.61–1.24)
Glucose, Bld: 296 mg/dL — ABNORMAL HIGH (ref 65–99)
HCT: 45 % (ref 39.0–52.0)
HEMOGLOBIN: 15.3 g/dL (ref 13.0–17.0)
POTASSIUM: 5.1 mmol/L (ref 3.5–5.1)
SODIUM: 132 mmol/L — AB (ref 135–145)
TCO2: 22 mmol/L (ref 22–32)

## 2017-05-01 LAB — I-STAT CG4 LACTIC ACID, ED: LACTIC ACID, VENOUS: 1.88 mmol/L (ref 0.5–1.9)

## 2017-05-01 LAB — BRAIN NATRIURETIC PEPTIDE: B NATRIURETIC PEPTIDE 5: 297.2 pg/mL — AB (ref 0.0–100.0)

## 2017-05-01 LAB — BASIC METABOLIC PANEL
Anion gap: 13 (ref 5–15)
BUN: 92 mg/dL — AB (ref 6–20)
CALCIUM: 9.2 mg/dL (ref 8.9–10.3)
CO2: 21 mmol/L — ABNORMAL LOW (ref 22–32)
Chloride: 96 mmol/L — ABNORMAL LOW (ref 101–111)
Creatinine, Ser: 3.2 mg/dL — ABNORMAL HIGH (ref 0.61–1.24)
GFR calc Af Amer: 20 mL/min — ABNORMAL LOW (ref >60)
GFR, EST NON AFRICAN AMERICAN: 18 mL/min — AB (ref >60)
GLUCOSE: 290 mg/dL — AB (ref 65–99)
POTASSIUM: 5.2 mmol/L — AB (ref 3.5–5.1)
Sodium: 130 mmol/L — ABNORMAL LOW (ref 135–145)

## 2017-05-01 LAB — LIPASE, BLOOD: Lipase: 45 U/L (ref 11–51)

## 2017-05-01 LAB — AMMONIA: AMMONIA: 19 umol/L (ref 9–35)

## 2017-05-01 LAB — GLUCOSE, CAPILLARY: GLUCOSE-CAPILLARY: 233 mg/dL — AB (ref 65–99)

## 2017-05-01 LAB — INFLUENZA PANEL BY PCR (TYPE A & B)
Influenza A By PCR: NEGATIVE
Influenza B By PCR: NEGATIVE

## 2017-05-01 LAB — I-STAT TROPONIN, ED: Troponin i, poc: 0.02 ng/mL (ref 0.00–0.08)

## 2017-05-01 MED ORDER — ONDANSETRON HCL 4 MG PO TABS: 4.0000 mg | ORAL_TABLET | Freq: Four times a day (QID) | ORAL | Status: DC | PRN

## 2017-05-01 MED ORDER — RANOLAZINE ER 500 MG PO TB12
500.0000 mg | ORAL_TABLET | Freq: Two times a day (BID) | ORAL | Status: DC
  Administered 2017-05-02 – 2017-05-04 (×6): 500 mg via ORAL
  Filled 2017-05-01 (×7): qty 1

## 2017-05-01 MED ORDER — CARVEDILOL 25 MG PO TABS
25.0000 mg | ORAL_TABLET | Freq: Two times a day (BID) | ORAL | Status: DC
  Administered 2017-05-01 – 2017-05-04 (×6): 25 mg via ORAL
  Filled 2017-05-01 (×6): qty 1

## 2017-05-01 MED ORDER — ACETAMINOPHEN 650 MG RE SUPP: 650.0000 mg | Freq: Four times a day (QID) | RECTAL | Status: DC | PRN

## 2017-05-01 MED ORDER — ASPIRIN EC 81 MG PO TBEC
81.0000 mg | DELAYED_RELEASE_TABLET | Freq: Every day | ORAL | Status: DC
  Administered 2017-05-02 – 2017-05-04 (×3): 81 mg via ORAL
  Filled 2017-05-01 (×3): qty 1

## 2017-05-01 MED ORDER — POLYETHYLENE GLYCOL 3350 17 G PO PACK: 17.0000 g | PACK | Freq: Every day | ORAL | Status: DC | PRN

## 2017-05-01 MED ORDER — INSULIN LISPRO PROT & LISPRO (50-50 MIX) 100 UNIT/ML KWIKPEN
10.0000 [IU] | PEN_INJECTOR | Freq: Three times a day (TID) | SUBCUTANEOUS | Status: DC
Start: 2017-05-02 — End: 2017-05-01

## 2017-05-01 MED ORDER — ATORVASTATIN CALCIUM 80 MG PO TABS
80.0000 mg | ORAL_TABLET | Freq: Every day | ORAL | Status: DC
  Administered 2017-05-01 – 2017-05-03 (×3): 80 mg via ORAL
  Filled 2017-05-01 (×3): qty 1

## 2017-05-01 MED ORDER — ONDANSETRON HCL 4 MG/2ML IJ SOLN: 4.0000 mg | Freq: Four times a day (QID) | INTRAMUSCULAR | Status: DC | PRN

## 2017-05-01 MED ORDER — SODIUM CHLORIDE 0.9 % IV BOLUS (SEPSIS)
500.0000 mL | Freq: Once | INTRAVENOUS | Status: AC
  Administered 2017-05-01: 500 mL via INTRAVENOUS

## 2017-05-01 MED ORDER — INSULIN ASPART PROT & ASPART (70-30 MIX) 100 UNIT/ML ~~LOC~~ SUSP
15.0000 [IU] | Freq: Two times a day (BID) | SUBCUTANEOUS | Status: DC
Start: 2017-05-02 — End: 2017-05-04
  Administered 2017-05-02 – 2017-05-04 (×6): 15 [IU] via SUBCUTANEOUS
  Filled 2017-05-01: qty 10

## 2017-05-01 MED ORDER — ALLOPURINOL 100 MG PO TABS
100.0000 mg | ORAL_TABLET | Freq: Every day | ORAL | Status: DC
  Administered 2017-05-02 – 2017-05-04 (×3): 100 mg via ORAL
  Filled 2017-05-01 (×3): qty 1

## 2017-05-01 MED ORDER — VENLAFAXINE HCL ER 150 MG PO CP24
150.0000 mg | ORAL_CAPSULE | Freq: Every day | ORAL | Status: DC
  Administered 2017-05-02 – 2017-05-03 (×2): 150 mg via ORAL
  Filled 2017-05-01 (×3): qty 1

## 2017-05-01 MED ORDER — INSULIN ASPART 100 UNIT/ML ~~LOC~~ SOLN
0.0000 [IU] | Freq: Three times a day (TID) | SUBCUTANEOUS | Status: DC
Start: 2017-05-02 — End: 2017-05-04
  Administered 2017-05-02: 2 [IU] via SUBCUTANEOUS
  Administered 2017-05-02: 3 [IU] via SUBCUTANEOUS
  Administered 2017-05-03: 2 [IU] via SUBCUTANEOUS
  Administered 2017-05-04: 3 [IU] via SUBCUTANEOUS
  Administered 2017-05-04: 8 [IU] via SUBCUTANEOUS

## 2017-05-01 MED ORDER — ZOLPIDEM TARTRATE 5 MG PO TABS
5.0000 mg | ORAL_TABLET | Freq: Every evening | ORAL | Status: DC | PRN
  Administered 2017-05-02: 5 mg via ORAL
  Filled 2017-05-01: qty 1

## 2017-05-01 MED ORDER — ONDANSETRON HCL 4 MG/2ML IJ SOLN
4.0000 mg | Freq: Once | INTRAMUSCULAR | Status: DC
  Administered 2017-05-01: 4 mg via INTRAVENOUS
  Filled 2017-05-01: qty 2

## 2017-05-01 MED ORDER — HEPARIN SODIUM (PORCINE) 5000 UNIT/ML IJ SOLN
5000.0000 [IU] | Freq: Three times a day (TID) | INTRAMUSCULAR | Status: DC
  Administered 2017-05-02 – 2017-05-04 (×8): 5000 [IU] via SUBCUTANEOUS
  Filled 2017-05-01 (×7): qty 1

## 2017-05-01 MED ORDER — ACETAMINOPHEN 325 MG PO TABS
650.0000 mg | ORAL_TABLET | Freq: Four times a day (QID) | ORAL | Status: DC | PRN
  Administered 2017-05-02: 650 mg via ORAL
  Filled 2017-05-01 (×2): qty 2

## 2017-05-01 MED ORDER — CLOPIDOGREL BISULFATE 75 MG PO TABS
75.0000 mg | ORAL_TABLET | Freq: Every day | ORAL | Status: DC
  Administered 2017-05-02 – 2017-05-04 (×3): 75 mg via ORAL
  Filled 2017-05-01 (×3): qty 1

## 2017-05-01 MED ORDER — PANTOPRAZOLE SODIUM 40 MG PO TBEC
40.0000 mg | DELAYED_RELEASE_TABLET | Freq: Every day | ORAL | Status: DC
  Administered 2017-05-02 – 2017-05-04 (×3): 40 mg via ORAL
  Filled 2017-05-01 (×3): qty 1

## 2017-05-01 MED ORDER — LORAZEPAM 2 MG/ML IJ SOLN
0.5000 mg | Freq: Once | INTRAMUSCULAR | Status: AC
  Administered 2017-05-01: 0.5 mg via INTRAVENOUS
  Filled 2017-05-01: qty 1

## 2017-05-01 MED ORDER — INSULIN ASPART PROT & ASPART (70-30 MIX) 100 UNIT/ML ~~LOC~~ SUSP
30.0000 [IU] | Freq: Every day | SUBCUTANEOUS | Status: DC
  Administered 2017-05-02: 30 [IU] via SUBCUTANEOUS

## 2017-05-01 MED ORDER — SODIUM CHLORIDE 0.9 % IV SOLN
INTRAVENOUS | Status: DC
  Administered 2017-05-01: 19:00:00 via INTRAVENOUS

## 2017-05-01 MED ORDER — FUROSEMIDE 40 MG PO TABS: 40.0000 mg | ORAL_TABLET | Freq: Two times a day (BID) | ORAL | 3 refills | Status: DC

## 2017-05-01 NOTE — Addendum Note (Signed)
Encounter addended by: Darron Doom, RN on: 05/01/2017 11:22 AM  Actions taken: Sign clinical note

## 2017-05-01 NOTE — ED Provider Notes (Signed)
[facility] EMERGENCY DEPARTMENT Provider Note   CSN: [id number] Arrival date & time: 05/01/17  1254     History   Chief Complaint Chief Complaint  Patient presents with  . Weakness    HPI [name] is a 77 y.o. male.  Patient with history of coronary artery disease status post CABG on Plavix, congestive heart failure on Lasix, diabetes, paroxysmal A. Fib --brought into the emergency department today by family with 3 days of progressively worsening confusion.  At baseline patient is typically oriented and walks well.  Over the past several days he has become disoriented, not making sense with his speech.  Symptoms have gradually been worsening.  Was seen at cardiologist office today for routine blood work.  Level 5 caveat due to altered mental status.  No reported fevers at home.  Patient had a fall approximately 10 days ago but did not have any acute sequelae.  Reportedly hypotensive at PCP office today when seen by nurse.      Past Medical History:  Diagnosis Date  . CAD (coronary artery disease)    s/p CABG 1997, PCI (BMS) of SVG to RCA 3/09, redo CABG 02/2011 with SVG to PDA, SVG to Lcx, s/p cath 2.2013 with ocluded SVT to left circ and patent SVG to RCA.  Cath 07/2013 Recent cath showed ischemic cardiomyopathy with LVEF less than 20%, with progressive LV dysfunction related to bypass graft failure, occlusion of the saphenous vein grafts placed in 2012 to the right coronary and to the circum  . Chronic systolic dysfunction of left ventricle    EF 30%  . Depression   . DJD (degenerative joint disease)   . DM (diabetes mellitus) ([place]) 02/14/2011  . GERD (gastroesophageal reflux disease)   . Gout   . HTN (hypertension) 02/14/2011  . Hyperlipemia   . Ischemic cardiomyopathy    s/p ICD Implantation by Dr Leonia Reeves  . Paroxysmal atrial fibrillation (HCC) 10/22/2014   single episode of AF x 3 hours 48 minutes recorded on ICD, chads2vasc score is at least 5       Patient Active Problem List   Diagnosis Date Noted  . NSTEMI (non-ST elevated myocardial infarction) ([place])   . Unstable angina ([place])   . Chest pain with high risk of acute coronary syndrome 08/01/2015  . CKD (chronic kidney disease) stage 3, GFR 30-59 ml/min (HCC) 06/14/2015  . Angina pectoris ([place]) 02/17/2015  . Paroxysmal atrial fibrillation ([place]) 01/11/2015  . Hypersomnia 04/09/2013  . Chronic systolic CHF (congestive heart failure) ([place]) 12/12/2012  . ICD-Medtr[employer] 02/27/2012  . Chronic cholecystitis with calculus 01/02/2012  . Nausea vomiting and diarrhea 12/24/2011  . Dizziness 12/24/2011  . Preop cardiovascular exam 12/24/2011  . Acute cholecystitis 12/24/2011  . CAD (coronary artery disease) 02/14/2011  . HTN (hypertension) 02/14/2011  . DM (diabetes mellitus) ([place]) 02/14/2011  . Ischemic cardiomyopathy 10/14/2010  . Hyperlipemia 10/14/2010    Past Surgical History:  Procedure Laterality Date  . BI-VENTRICULAR IMPLANTABLE CARDIOVERTER DEFIBRILLATOR UPGRADE N/A 08/24/2011   Procedure: BI-VENTRICULAR IMPLANTABLE CARDIOVERTER DEFIBRILLATOR UPGRADE;  Surgeon: Evans Lance, MD;  Location: [facility] CATH LAB;  Service: Cardiovascular;  Laterality: N/A;  . CARDIAC CATHETERIZATION  08/03/2015   Procedure: Left Heart Cath and Cors/Grafts Angiography;  Surgeon: Peter M Martinique, MD;  Location: [facility] CV LAB;  Service: Cardiovascular;;  . CARDIAC CATHETERIZATION N/A 08/06/2015   Procedure: Coronary Stent Intervention w/Impella;  Surgeon: Jettie Booze, MD;  Location: [place] CV LAB;  Service: Cardiovascular;  Laterality: N/A;  . CARDIAC CATHETERIZATION  08/06/2015   Procedure: Left Heart Cath;  Surgeon: Jettie Booze, MD;  Location: [place] CV LAB;  Service: Cardiovascular;;  . CARDIAC CATHETERIZATION  08/06/2015   Procedure: Coronary Balloon Angioplasty;  Surgeon: Jettie Booze, MD;  Location: [place] CV LAB;  Service: Cardiovascular;;  . CARDIAC  DEFIBRILLATOR PLACEMENT  08/2004   initial placement, upgraded to [employer] ICD by Dr Lovena Le 08/24/11 (MDT)  . CATARACT EXTRACTION W/ INTRAOCULAR LENS  IMPLANT, BILATERAL  2012  . CHOLECYSTECTOMY  01/05/2012   Procedure: LAPAROSCOPIC CHOLECYSTECTOMY WITH INTRAOPERATIVE CHOLANGIOGRAM;  Surgeon: Joyice Faster. Cornett, MD;  Location: [place];  Service: General;  Laterality: N/A;  laparoscopic cholecysectoym with intraoperative cholangiogram  . COLONOSCOPY WITH PROPOFOL N/A 11/18/2013   Procedure: COLONOSCOPY WITH PROPOFOL;  Surgeon: Garlan Fair, MD;  Location: [facility] ENDOSCOPY;  Service: Endoscopy;  Laterality: N/A;  . CORONARY ANGIOPLASTY WITH STENT PLACEMENT  06/2007   BMS to SVG to RCA  . CORONARY ARTERY BYPASS GRAFT  01/1996   CABG x 5 LIMA to LAD SVG to diag1,2,svg to om,svg to RCA  . CORONARY ARTERY BYPASS GRAFT  03/06/2011   CABG X2; Procedure: REDO CORONARY ARTERY BYPASS GRAFTING (CABG);  Surgeon: Grace Isaac, MD;  Location: [place];  Service: Open Heart Surgery;  Laterality: N/A;  times two grafts using right saphenous vein harvested endoscopically.  Marland Kitchen KNEE ARTHROTOMY  ~ 1978   RIGHT KNEE CARTILAGE REMOVED  . LEFT HEART CATHETERIZATION WITH CORONARY ANGIOGRAM N/A 06/06/2011   Procedure: LEFT HEART CATHETERIZATION WITH CORONARY ANGIOGRAM;  Surgeon: Sueanne Margarita, MD;  Location: [place] CATH LAB;  Service: Cardiovascular;  Laterality: N/A;  . LEFT HEART CATHETERIZATION WITH CORONARY/GRAFT ANGIOGRAM N/A 08/08/2013   Procedure: LEFT HEART CATHETERIZATION WITH Beatrix Fetters;  Surgeon: Sinclair Grooms, MD;  Location: [facility] CATH LAB;  Service: Cardiovascular;  Laterality: N/A;  . LUMBAR [place] SURGERY  2003  . RIGHT HEART CATHETERIZATION N/A 02/17/2014   Procedure: RIGHT HEART CATH;  Surgeon: Larey Dresser, MD;  Location: [facility] CATH LAB;  Service: Cardiovascular;  Laterality: N/A;  . VENOGRAM N/A 06/08/2011   Procedure: VENOGRAM;  Surgeon: Thompson Grayer, MD;  Location: [facility] CATH LAB;  Service: Cardiovascular;   Laterality: N/A;       Home Medications    Prior to Admission medications   Medication Sig Start Date End Date Taking? Authorizing Provider  allopurinol (ZYLOPRIM) 100 MG tablet Take 100 mg by mouth 2 (two) times daily.     [provider]  aspirin EC 81 MG tablet Take 81 mg by mouth daily.    [provider]  atorvastatin (LIPITOR) 80 MG tablet TAKE 1 TABLET BY MOUTH DAILY AT 6:00 PM 05/31/16   Bensimhon, Shaune Pascal, MD  carvedilol (COREG) 25 MG tablet Take 1 tablet (25 mg total) by mouth 2 (two) times daily with a meal. 02/06/17   Larey Dresser, MD  clopidogrel (PLAVIX) 75 MG tablet Take 1 tablet (75 mg total) by mouth daily. 02/03/14   Allred, Jeneen Rinks, MD  eplerenone (INSPRA) 25 MG tablet Take 1 tablet (25 mg total) by mouth every evening. 03/17/15   Larey Dresser, MD  furosemide (LASIX) 40 MG tablet Take 1 tablet (40 mg total) by mouth 2 (two) times daily. 05/01/17   Larey Dresser, MD  Insulin Lispro Prot & Lispro (HUMALOG MIX 50/50 KWIKPEN) (50-50) 100 UNIT/ML Kwikpen 15 units at breakfast and lunch, 30 units before supper.  Take insulin  10 minutes before eating 11/05/15   Elayne Snare, MD  Insulin Pen Needle (RELION PEN NEEDLES) 32G X 4 MM MISC Use to inject insulin 3 times per day. 02/04/16   Elayne Snare, MD  insulin regular human CONCENTRATED (HUMULIN R U-500 KWIKPEN) 500 UNIT/ML kwikpen 30 units before BFST, 20 at lunch and 35 before supper, take 30-60 minutes before each meal 02/04/16   Elayne Snare, MD  lisinopril (PRINIVIL,ZESTRIL) 5 MG tablet Take 2.5 mg in the AM and 5mg  in the PM 01/16/17   Larey Dresser, MD  nitroGLYCERIN (NITROSTAT) 0.4 MG SL tablet Place 1 tablet (0.4 mg total) under the tongue every 5 (five) minutes x 3 doses as needed. For chest pain. 05/25/16   Larey Dresser, MD  ONE TOUCH ULTRA TEST test strip Use to check blood sugar 3 times per day. 02/04/16   Elayne Snare, MD  [facility] DELICA LANCETS 27P MISC Use to check blood sugar 3 times per day  dx code E11.9 02/04/16   Elayne Snare, MD  pantoprazole (PROTONIX) 40 MG tablet TAKE ONE TABLET BY MOUTH ONCE DAILY 07/11/16   Bensimhon, Shaune Pascal, MD  ranolazine (RANEXA) 1000 MG SR tablet Take 500 mg by mouth 2 (two) times daily.    [provider]  venlafaxine (EFFEXOR-XR) 150 MG 24 hr capsule Take 150 mg by mouth at bedtime.     [provider]  zolpidem (AMBIEN CR) 12.5 MG CR tablet Take 12.5 mg by mouth at bedtime as needed. For sleep 04/04/11   [provider]    Family History Family History  Problem Relation Age of Onset  . Heart failure Mother   . Heart attack Mother   . Heart failure Father   . Heart attack Father   . Coronary artery disease Unknown   . Sudden Cardiac Death Brother        in his 80's  . Heart attack Brother   . Cancer Brother     Social History Social History   Tobacco Use  . Smoking status: Former Smoker    Packs/day: 0.50    Years: 15.00    Pack years: 7.50    Types: Cigarettes    Last attempt to quit: 04/17/1969    Years since quitting: 48.0  . Smokeless tobacco: Former Systems developer    Quit date: 05/05/1971  Substance Use Topics  . Alcohol use: Yes    Alcohol/week: 0.6 oz    Types: 1 Cans of beer per week    Comment: occasional  . Drug use: No     Allergies   Codeine and Latex   Review of Systems Review of Systems  Unable to perform ROS: Mental status change     Physical Exam Updated Vital Signs BP (!) 128/110   Pulse 79   Resp (!) 22   SpO2 100%   Physical Exam  Constitutional: He appears well-developed and well-nourished.  HENT:  Head: Normocephalic and atraumatic.  Mouth/Throat: Oropharynx is clear and moist.  Eyes: Conjunctivae are normal. Right eye exhibits no discharge. Left eye exhibits no discharge.  Neck: Normal range of motion. Neck supple.  Cardiovascular: Normal rate, regular rhythm and normal heart sounds.  No murmur heard. Pulmonary/Chest: Effort normal and breath sounds normal. No stridor.  No respiratory distress. He has no wheezes.  PT states it feels like he cannot breathe. Maintains normal pulse ox.   Abdominal: Soft. There is tenderness. There is no rebound and no guarding.  Pt winces and calls for  wife when I palpate abdomen.   Musculoskeletal:  Patient moves all extremities purposefully.  Patient can tell me his name but is otherwise disoriented.  Is not able to follow commands well.  Patient with clear but inappropriate speech pattern.   Neurological: He is alert.  Patient moves all extremities purposefully.  Patient can tell me his name but is otherwise disoriented.  Is not able to follow commands well.  Patient with clear but inappropriate speech pattern.   Skin: Skin is warm and dry.  Psychiatric: He has a normal mood and affect.  Nursing note and vitals reviewed.    ED Treatments / Results  Labs (all labs ordered are listed, but only abnormal results are displayed) Labs Reviewed  COMPREHENSIVE METABOLIC PANEL - Abnormal; Notable for the following components:      Result Value   Sodium 129 (*)    Potassium 5.2 (*)    Chloride 94 (*)    CO2 20 (*)    Glucose, Bld 309 (*)    BUN 91 (*)    Creatinine, Ser 3.11 (*)    GFR calc non Af Amer 18 (*)    GFR calc Af Amer 21 (*)    All other components within normal limits  CBC WITH DIFFERENTIAL/PLATELET - Abnormal; Notable for the following components:   WBC 13.6 (*)    RDW 15.8 (*)    Neutro Abs 10.7 (*)    All other components within normal limits  URINALYSIS, ROUTINE W REFLEX MICROSCOPIC - Abnormal; Notable for the following components:   Color, Urine STRAW (*)    Glucose, UA 50 (*)    All other components within normal limits  I-STAT CHEM 8, ED - Abnormal; Notable for the following components:   Sodium 132 (*)    Chloride 97 (*)    BUN 79 (*)    Creatinine, Ser 3.00 (*)    Glucose, Bld 296 (*)    Calcium, Ion 1.13 (*)    All other components within normal limits  CULTURE, BLOOD (ROUTINE X 2)    CULTURE, BLOOD (ROUTINE X 2)  URINE CULTURE  LIPASE, BLOOD  AMMONIA  BRAIN NATRIURETIC PEPTIDE  I-STAT CG4 LACTIC ACID, ED  I-STAT TROPONIN, ED  I-STAT CG4 LACTIC ACID, ED    EKG  EKG Interpretation  Date/Time:  Tuesday May 01 2017 13:03:26 EST Ventricular Rate:  82 PR Interval:    QRS Duration: 182 QT Interval:  463 QTC Calculation: 541 R Axis:   61 Text Interpretation:  Accelerated junctional rhythm Left bundle branch block Confirmed by Julianne Rice [phone]) on 05/01/2017 1:10:43 PM       Radiology Ct Head Wo Contrast  Result Date: 05/01/2017 CLINICAL DATA:  Altered mental status. EXAM: CT HEAD WITHOUT CONTRAST TECHNIQUE: Contiguous axial images were obtained from the base of the skull through the vertex without intravenous contrast. COMPARISON:  06/18/2015 FINDINGS: Brain: Ventricles, cisterns and other CSF spaces are within normal. There is no mass, mass effect, shift of midline structures or acute hemorrhage. There is no evidence of acute infarction. Vascular: No hyperdense vessel or unexpected calcification. Skull: Normal. Negative for fracture or focal lesion. Sinuses/Orbits: Orbits are within normal. Paranasal sinuses are well developed and well aerated. Subtle mucosal membrane thickening involving the left sphenoid sinus. Other: None. IMPRESSION: Normal head CT. Subtle chronic inflammatory change of the left maxillary and sphenoid sinuses. Electronically Signed   By: Marin Olp M.D.   On: 05/01/2017 14:20   Dg Chest [facility]  Result Date: 05/01/2017 CLINICAL DATA:  Hypertension, weakness, mental status change, headache. History of coronary artery disease and diabetes EXAM: PORTABLE CHEST 1 VIEW COMPARISON:  Portable chest x-ray of August 01, 2015 FINDINGS: The lungs are reasonably well inflated. There is no focal infiltrate. The cardiac silhouette is enlarged. The pulmonary vascularity is mildly prominent. There is no significant pulmonary edema. The ICD is in  stable position. The sternal wires are intact. There calcification in the wall of the aortic arch. IMPRESSION: Low-grade compensated CHF.  No acute pulmonary edema or pneumonia. Thoracic aortic atherosclerosis.  Previous CABG. Electronically Signed   By: David  Martinique M.D.   On: 05/01/2017 13:58    Procedures Procedures (including critical care time)  Medications Ordered in ED Medications  sodium chloride 0.9 % bolus 500 mL (500 mLs Intravenous New Bag/Given 05/01/17 1544)  ondansetron (ZOFRAN) injection 4 mg (not administered)  LORazepam (ATIVAN) injection 0.5 mg (not administered)     Initial Impression / Assessment and Plan / ED Course  I have reviewed the triage vital signs and the nursing notes.  Pertinent labs & imaging results that were available during my care of the patient were reviewed by me and considered in my medical decision making (see chart for details).     Patient seen and examined. Acute delirium from unclear underlying cause. Work-up initiated. EKG reviewed with Dr. Lita Mains. He will see patient.   Vital signs reviewed and are as follows: BP (!) 128/110   Pulse 79   Resp (!) 22   SpO2 100%   4:26 PM Spoke with Dr. Lonny Prude who will see.   Pt having some heaving, zofran ordered. Also ativan for restlessness.   Given questionable abd pain, CT abd without contrast ordered.   Discussed results with family at bedside.   Final Clinical Impressions(s) / ED Diagnoses   Final diagnoses:  Acute delirium  Acute kidney injury ([place])   Admit. Unclear cause of symptoms based on initial eval.   ED Discharge Orders    None       Carlisle Cater, PA-C 05/01/17 1628    Julianne Rice, MD 05/04/17 [phone]

## 2017-05-01 NOTE — Telephone Encounter (Signed)
Remote ICM transmission received.  Attempted call to patient and voice mail box is not set up

## 2017-05-01 NOTE — Progress Notes (Signed)
Patient was very disoriented at lab appointment.  Family member said this has happened in the past when lasix has been increased.  I spoke with Dr. Aundra Dubin and he advises patient to decrease lasix back to 40 mg Twice daily.  Medication changed and family is aware.

## 2017-05-01 NOTE — ED Notes (Signed)
Called and notified 62M that patient is clean/dry and is now ready to be transported.

## 2017-05-01 NOTE — Patient Instructions (Signed)
DECREASE Lasix to 40 mg (1 Tablet) twice Daily.

## 2017-05-01 NOTE — Progress Notes (Signed)
EPIC Encounter for ICM Monitoring  Patient Name: [name] is a 77 y.o. male Date: 05/01/2017 Primary Care Physican: Mayra Neer, MD Primary Cardiologist:[place] Electrophysiologist: Allred Dry Weight:Previous weight 195lbs ( baseline 191 -192 lbs) Bi-V Pacing: 98.6%      Attempted call to patient and unable to reach.   Transmission reviewed.    Thoracic impedance normal.  Prescribed dosage: Furosemide 40 mg 1.5 tablets (60 mg total) by mouth 2 (two) times daily.  Dr Claris Gladden OV note 04/21/17 ordered Furosemide 40 mg to 2 tablets (80 mg total) in AM, then 1.5 tablets 60 mg total) in PM but not updated in med list  Labs:  Lab scheduled for 05/01/17 03/23/2017 Creatinine 1.44, BUN 21, Potassium 4.1, Sodium 139, EGFR 46-54 01/16/2017 Creatinine 1.28, BUN 18, Potassium 3.5, Sodium 137, EGFR 53->60 09/21/2016 Creatinine 1.49, BUN 27, Potassium 4.1, Sodium 135, EGFR 44-52 09/14/2016 Creatinine 1.62, BUN 36, Potassium 4.5, Sodium 135, EGFR 42-51 09/12/2016 Creatinine 1.74, BUN 38, Potassium 5.0, Sodium 135, EGFR 37-43 08/29/2016 Creatinine 1.30, BUN 17, Potassium 2.9, Sodium 139, EGFR 53->60, BNP 391.5 06/14/2016 Creatinine 1.43, BUN 26, Potassium 4.0, Sodium 131, EGFR 47-55 05/25/2016 Creatinine 1.39, BUN 20, Potassium 3.7, Sodium 137, EGFR 49-56  10/13/2017Creatinine 1.31, BUN 19, Potassium 3.9, Sodium 138, EGFR 52->60  Recommendations: NONE - Unable to reach.  Follow-up plan: ICM clinic phone appointment on 05/31/2017.    Copy of ICM check sent to Dr. Rayann Heman.   3 month ICM trend: 04/30/2017    1 Year ICM trend:       [name], RN 05/01/2017 11:48 AM

## 2017-05-01 NOTE — ED Triage Notes (Signed)
Pt arrived via [facility] from [facility] where he was found to be hypotensive with 80/50s. Pt was c/o weakness while at dr office. Pt wife states he had an acute change from baseline 3 days ago, noting increased confusion. Pt is alert but oriented to his name only. Pt not able to form complete sentences or follow commands from RN. Pt wife states he fell 10 days ago while walking his dog, bruising and skin tears noted to arms and right chest. Unknown if he struck his head when he fell. Pt is on Plavix and ASA 81 daily.

## 2017-05-01 NOTE — ED Notes (Signed)
RN called report, together w/ NT, changed all linen and gown as pt was wet.  Pt was able to cooperate but did get confused at times, always able to redirect.

## 2017-05-01 NOTE — H&P (Signed)
History and Physical    [name] FVC:[id number] DOB: [birth year] DOA: 05/01/2017  PCP: Mayra Neer, MD Patient coming from: Home  Chief Complaint: Confusion  HPI: [name] is a 77 y.o. male with medical history significant of ischemic cardiomyopathy, chronic systolic and diastolic heart failure, diabetes mellitus insulin-dependent, essential hypertension, hyperlipidemia, CAD, chronic kidney disease stage III.  Patient is confused and is unable to provide history.  History is provided by the patient's wife and the patient's daughter.  Patient presented today with 3-day history of worsening confusion.  2 weeks ago, the patient saw his cardiologist who increased his Lasix from 40 mg twice daily to 80 mg in the morning and 60 mg at night.  He was scheduled to see cardiac rehab today for recheck of his labs and subsequently went to his primary care physician for evaluation of his confusion.  While at his PCPs office, he labs were obtained and family was told to present to the emergency department for evaluation and management.   ED Course: Vitals: Afebrile, normal pulse with last BP of 138/81 and 99-100% on room air SpO2 Labs: BUN/CR of 79/3.00, sodium of 132, WBC of 13.6, glucose of 296 Imaging: CT head significant for chronic sinus disease and chest x-ray significant for compensated CHF.  Medications/Course: NS 500 ml bolus  Review of Systems: Review of Systems  Unable to perform ROS: Mental status change  Gastrointestinal: Positive for vomiting.    Past Medical History:  Diagnosis Date  . CAD (coronary artery disease)    s/p CABG 1997, PCI (BMS) of SVG to RCA 3/09, redo CABG 02/2011 with SVG to PDA, SVG to Lcx, s/p cath 2.2013 with ocluded SVT to left circ and patent SVG to RCA.  Cath 07/2013 Recent cath showed ischemic cardiomyopathy with LVEF less than 20%, with progressive LV dysfunction related to bypass graft failure, occlusion of the saphenous vein grafts placed in 2012 to  the right coronary and to the circum  . Chronic systolic dysfunction of left ventricle    EF 30%  . Depression   . DJD (degenerative joint disease)   . DM (diabetes mellitus) ([place]) 02/14/2011  . GERD (gastroesophageal reflux disease)   . Gout   . HTN (hypertension) 02/14/2011  . Hyperlipemia   . Ischemic cardiomyopathy    s/p ICD Implantation by Dr Leonia Reeves  . Paroxysmal atrial fibrillation (HCC) 10/22/2014   single episode of AF x 3 hours 48 minutes recorded on ICD, chads2vasc score is at least 5     Past Surgical History:  Procedure Laterality Date  . BI-VENTRICULAR IMPLANTABLE CARDIOVERTER DEFIBRILLATOR UPGRADE N/A 08/24/2011   Procedure: BI-VENTRICULAR IMPLANTABLE CARDIOVERTER DEFIBRILLATOR UPGRADE;  Surgeon: Evans Lance, MD;  Location: [facility] CATH LAB;  Service: Cardiovascular;  Laterality: N/A;  . CARDIAC CATHETERIZATION  08/03/2015   Procedure: Left Heart Cath and Cors/Grafts Angiography;  Surgeon: Peter M Martinique, MD;  Location: [place] CV LAB;  Service: Cardiovascular;;  . CARDIAC CATHETERIZATION N/A 08/06/2015   Procedure: Coronary Stent Intervention w/Impella;  Surgeon: Jettie Booze, MD;  Location: [place] CV LAB;  Service: Cardiovascular;  Laterality: N/A;  . CARDIAC CATHETERIZATION  08/06/2015   Procedure: Left Heart Cath;  Surgeon: Jettie Booze, MD;  Location: [facility] CV LAB;  Service: Cardiovascular;;  . CARDIAC CATHETERIZATION  08/06/2015   Procedure: Coronary Balloon Angioplasty;  Surgeon: Jettie Booze, MD;  Location: [place] CV LAB;  Service: Cardiovascular;;  . CARDIAC DEFIBRILLATOR PLACEMENT  08/2004   initial  placement, upgraded to [place] ICD by Dr Lovena Le 08/24/11 (MDT)  . CATARACT EXTRACTION W/ INTRAOCULAR LENS  IMPLANT, BILATERAL  2012  . CHOLECYSTECTOMY  01/05/2012   Procedure: LAPAROSCOPIC CHOLECYSTECTOMY WITH INTRAOPERATIVE CHOLANGIOGRAM;  Surgeon: Joyice Faster. Cornett, MD;  Location: [place];  Service: General;  Laterality: N/A;  laparoscopic  cholecysectoym with intraoperative cholangiogram  . COLONOSCOPY WITH PROPOFOL N/A 11/18/2013   Procedure: COLONOSCOPY WITH PROPOFOL;  Surgeon: Garlan Fair, MD;  Location: [facility] ENDOSCOPY;  Service: Endoscopy;  Laterality: N/A;  . CORONARY ANGIOPLASTY WITH STENT PLACEMENT  06/2007   BMS to SVG to RCA  . CORONARY ARTERY BYPASS GRAFT  01/1996   CABG x 5 LIMA to LAD SVG to diag1,2,svg to om,svg to RCA  . CORONARY ARTERY BYPASS GRAFT  03/06/2011   CABG X2; Procedure: REDO CORONARY ARTERY BYPASS GRAFTING (CABG);  Surgeon: Grace Isaac, MD;  Location: [place];  Service: Open Heart Surgery;  Laterality: N/A;  times two grafts using right saphenous vein harvested endoscopically.  Marland Kitchen KNEE ARTHROTOMY  ~ 1978   RIGHT KNEE CARTILAGE REMOVED  . LEFT HEART CATHETERIZATION WITH CORONARY ANGIOGRAM N/A 06/06/2011   Procedure: LEFT HEART CATHETERIZATION WITH CORONARY ANGIOGRAM;  Surgeon: Sueanne Margarita, MD;  Location: [place] [facility] CATH LAB;  Service: Cardiovascular;  Laterality: N/A;  . LEFT HEART CATHETERIZATION WITH CORONARY/GRAFT ANGIOGRAM N/A 08/08/2013   Procedure: LEFT HEART CATHETERIZATION WITH Beatrix Fetters;  Surgeon: Sinclair Grooms, MD;  Location: [facility] CATH LAB;  Service: Cardiovascular;  Laterality: N/A;  . LUMBAR [place] SURGERY  2003  . RIGHT HEART CATHETERIZATION N/A 02/17/2014   Procedure: RIGHT HEART CATH;  Surgeon: Larey Dresser, MD;  Location: [facility] CATH LAB;  Service: Cardiovascular;  Laterality: N/A;  . VENOGRAM N/A 06/08/2011   Procedure: VENOGRAM;  Surgeon: Thompson Grayer, MD;  Location: [facility] CATH LAB;  Service: Cardiovascular;  Laterality: N/A;     reports that he quit smoking about 48 years ago. His smoking use included cigarettes. He has a 7.50 pack-year smoking history. He quit smokeless tobacco use about 46 years ago. He reports that he drinks about 0.6 oz of alcohol per week. He reports that he does not use drugs.  Allergies  Allergen Reactions  . Codeine Nausea And Vomiting  . Latex Rash      Specifies adhesive     Family History  Problem Relation Age of Onset  . Heart failure Mother   . Heart attack Mother   . Heart failure Father   . Heart attack Father   . Coronary artery disease Unknown   . Sudden Cardiac Death Brother        in his 18's  . Heart attack Brother   . Cancer Brother    Prior to Admission medications   Medication Sig Start Date End Date Taking? Authorizing Provider  allopurinol (ZYLOPRIM) 100 MG tablet Take 100 mg by mouth daily.    Yes [provider]  aspirin EC 81 MG tablet Take 81 mg by mouth daily.   Yes [provider]  atorvastatin (LIPITOR) 80 MG tablet TAKE 1 TABLET BY MOUTH DAILY AT 6:00 PM 05/31/16  Yes Bensimhon, Shaune Pascal, MD  carvedilol (COREG) 25 MG tablet Take 1 tablet (25 mg total) by mouth 2 (two) times daily with a meal. 02/06/17  Yes Larey Dresser, MD  clopidogrel (PLAVIX) 75 MG tablet Take 1 tablet (75 mg total) by mouth daily. 02/03/14  Yes Allred, Jeneen Rinks, MD  eplerenone (INSPRA) 25 MG tablet Take 1  tablet (25 mg total) by mouth every evening. 03/17/15  Yes Larey Dresser, MD  furosemide (LASIX) 40 MG tablet Take 1 tablet (40 mg total) by mouth 2 (two) times daily. 05/01/17  Yes Larey Dresser, MD  Insulin Lispro Prot & Lispro (HUMALOG MIX 50/50 KWIKPEN) (50-50) 100 UNIT/ML Kwikpen 15 units at breakfast and lunch, 30 units before supper.  Take insulin 10 minutes before eating 11/05/15  Yes Elayne Snare, MD  insulin regular human CONCENTRATED (HUMULIN R U-500 KWIKPEN) 500 UNIT/ML kwikpen 30 units before BFST, 20 at lunch and 35 before supper, take 30-60 minutes before each meal 02/04/16  Yes Elayne Snare, MD  lisinopril (PRINIVIL,ZESTRIL) 5 MG tablet Take 2.5 mg in the AM and 5mg  in the PM 01/16/17  Yes Larey Dresser, MD  nitroGLYCERIN (NITROSTAT) 0.4 MG SL tablet Place 1 tablet (0.4 mg total) under the tongue every 5 (five) minutes x 3 doses as needed. For chest pain. 05/25/16  Yes Larey Dresser, MD  pantoprazole  (PROTONIX) 40 MG tablet TAKE ONE TABLET BY MOUTH ONCE DAILY 07/11/16  Yes Bensimhon, Shaune Pascal, MD  ranolazine (RANEXA) 1000 MG SR tablet Take 500 mg by mouth 2 (two) times daily.   Yes [provider]  venlafaxine (EFFEXOR-XR) 150 MG 24 hr capsule Take 150 mg by mouth at bedtime.    Yes [provider]  zolpidem (AMBIEN CR) 12.5 MG CR tablet Take 12.5 mg by mouth at bedtime. For sleep 04/04/11  Yes [provider]  Insulin Pen Needle (RELION PEN NEEDLES) 32G X 4 MM MISC Use to inject insulin 3 times per day. 02/04/16   Elayne Snare, MD  ONE TOUCH ULTRA TEST test strip Use to check blood sugar 3 times per day. 02/04/16   Elayne Snare, MD  [facility] DELICA LANCETS 16X MISC Use to check blood sugar 3 times per day dx code E11.9 02/04/16   Elayne Snare, MD    Physical Exam: Vitals:   05/01/17 1425 05/01/17 1444 05/01/17 1500 05/01/17 1530  BP: 112/65  (!) 136/116 138/81  Pulse: 65  87 81  Resp:      Temp:  98.4 F (36.9 C)    TempSrc:  Rectal    SpO2: 100%  100% 99%     Constitutional: NAD, calm, comfortable Eyes: PERRL, lids and conjunctivae normal ENMT: Mucous membranes are dry. Posterior pharynx clear of any exudate or lesions. Neck: normal, supple, no masses, no thyromegaly Respiratory: clear to auscultation bilaterally, no wheezing, no crackles. Normal respiratory effort. No accessory muscle use.  Cardiovascular: Regular rate and rhythm, no murmurs / rubs / gallops. No extremity edema. 2+ pedal pulses. No carotid bruits.  Abdomen: no tenderness, no masses palpated. No hepatosplenomegaly. Bowel sounds positive.  Musculoskeletal: no clubbing / cyanosis. No joint deformity upper and lower extremities. Good ROM, no contractures. Normal muscle tone.  Skin: no rashes, lesions, ulcers. No induration. Poor skin turgor Neurologic: CN 2-12 grossly intact. Sensation intact, DTR normal. Strength 5/5 in all 4. Mild dysarthria (no dentures) Psychiatric: Impaired judgment and  insight. Alert and oriented to person. Slightly agitated mood.    Labs on Admission: I have personally reviewed following labs and imaging studies  CBC: Recent Labs  Lab 05/01/17 1318 05/01/17 1355  WBC 13.6*  --   NEUTROABS 10.7*  --   HGB 13.9 15.3  HCT 41.8 45.0  MCV 89.5  --   PLT 303  --    Basic Metabolic Panel: Recent Labs  Lab 05/01/17  1107 05/01/17 1318 05/01/17 1355  NA 130* 129* 132*  K 5.2* 5.2* 5.1  CL 96* 94* 97*  CO2 21* 20*  --   GLUCOSE 290* 309* 296*  BUN 92* 91* 79*  CREATININE 3.20* 3.11* 3.00*  CALCIUM 9.2 9.7  --    GFR: Estimated Creatinine Clearance: 24.5 mL/min (A) (by C-G formula based on SCr of 3 mg/dL (H)). Liver Function Tests: Recent Labs  Lab 05/01/17 1318  AST 22  ALT 25  ALKPHOS 102  BILITOT 0.8  PROT 8.0  ALBUMIN 4.3   Recent Labs  Lab 05/01/17 1318  LIPASE 45   Recent Labs  Lab 05/01/17 1318  AMMONIA 19   Urine analysis:    Component Value Date/Time   COLORURINE STRAW (A) 05/01/2017 1505   APPEARANCEUR CLEAR 05/01/2017 1505   LABSPEC 1.009 05/01/2017 1505   PHURINE 5.0 05/01/2017 1505   GLUCOSEU 50 (A) 05/01/2017 1505   HGBUR NEGATIVE 05/01/2017 [place] 05/01/2017 [place] 05/01/2017 1505   PROTEINUR NEGATIVE 05/01/2017 1505   UROBILINOGEN 2.0 (H) 12/23/2011 2145   NITRITE NEGATIVE 05/01/2017 1505   LEUKOCYTESUR NEGATIVE 05/01/2017 1505   Sepsis Labs: !!!!!!!!!!!!!!!!!!!!!!!!!!!!!!!!!!!!!!!!!!!! @LABRCNTIP (procalcitonin:4,lacticidven:4) )No results found for this or any previous visit (from the past 240 hour(s)).   Radiological Exams on Admission: Ct Head Wo Contrast  Result Date: 05/01/2017 CLINICAL DATA:  Altered mental status. EXAM: CT HEAD WITHOUT CONTRAST TECHNIQUE: Contiguous axial images were obtained from the base of the skull through the vertex without intravenous contrast. COMPARISON:  06/18/2015 FINDINGS: Brain: Ventricles, cisterns and other CSF spaces are  within normal. There is no mass, mass effect, shift of midline structures or acute hemorrhage. There is no evidence of acute infarction. Vascular: No hyperdense vessel or unexpected calcification. Skull: Normal. Negative for fracture or focal lesion. Sinuses/Orbits: Orbits are within normal. Paranasal sinuses are well developed and well aerated. Subtle mucosal membrane thickening involving the left sphenoid sinus. Other: None. IMPRESSION: Normal head CT. Subtle chronic inflammatory change of the left maxillary and sphenoid sinuses. Electronically Signed   By: Marin Olp M.D.   On: 05/01/2017 14:20   Dg Chest Port 1 View  Result Date: 05/01/2017 CLINICAL DATA:  Hypertension, weakness, mental status change, headache. History of coronary artery disease and diabetes EXAM: PORTABLE CHEST 1 VIEW COMPARISON:  Portable chest x-ray of August 01, 2015 FINDINGS: The lungs are reasonably well inflated. There is no focal infiltrate. The cardiac silhouette is enlarged. The pulmonary vascularity is mildly prominent. There is no significant pulmonary edema. The ICD is in stable position. The sternal wires are intact. There calcification in the wall of the aortic arch. IMPRESSION: Low-grade compensated CHF.  No acute pulmonary edema or pneumonia. Thoracic aortic atherosclerosis.  Previous CABG. Electronically Signed   By: David  Martinique M.D.   On: 05/01/2017 13:58    EKG: Independently reviewed.  Assessment/Plan Principal Problem:   Acute kidney injury ([place]) Active Problems:   Ischemic cardiomyopathy   Hyperlipemia   CAD (coronary artery disease)   HTN (hypertension)   DM (diabetes mellitus) (HCC)   Chronic systolic CHF (congestive heart failure) (HCC)   Confusion   Acute kidney injury on CKD 3 Baseline creatinine of 1.3-1.4.  Up to 3.00 today.  Likely secondary to hypovolemia secondary to overdiuresis.  Patient with recent adjustment to his diuretic regimen.  BUN also elevated at 91 earlier today down to 79.   Patient appears hypovolemic on exam. -IV fluids at 50 ml per  hour -Repeat BMP in the morning -Strict in and out -Watch for respiratory symptoms of pulmonary edema while on IV fluids  Confusion Likely secondary to uremia.  Patient with an elevated WBC.  Urinalysis does not suggest urinary tract infection. CT head and chest x-ray unremarkable. Ammonia wnl. -Influenza panel -Treatment of AKI -Urine culture pending  Ischemic cardiomyopathy Chronic systolic heart failure Appears hypovolemic on exam.  Last EF was 25-30% with grade 2 diastolic dysfunction and severe hypokinesis on echocardiogram from November 2017.  Patient is status post [employer] ICD.  Patient follows with [facility] as an outpatient. -BNP -Hold furosemide, eplerenone, and lisinopril -Strict in and out/daily weights  Hyperlipidemia CAD -Continue Aspirin, Plavix and Lipitor  Diabetes mellitus, insulin-dependent, uncontrolled with hyperglycemia Per chart review, difficult to get a good handle of patient's outpatient regimen.  He was last seen by endocrinology in 2017.  He takes U 100 and U 500 insulin. -Reduced dose of insulin 50/50 to 10 units TID WC -Sliding scale moderate -May need to significantly adjust based on blood sugar overnight  Prolonged QT Manually calculated to be 514. Patient with ICD implanted. -repeat EKG in AM   DVT prophylaxis: Heparin cutaneous Code Status: DNR Family Communication: Wife and daughter at bedside Disposition Plan: Pending medical improvement Consults called: None Admission status: Inpatient telemetry   Cordelia Poche, MD [facility] Pager [phone]  If 7PM-7AM, please contact night-coverage [email or web address] Password [email or web address]  05/01/2017, 4:10 PM

## 2017-05-01 NOTE — Addendum Note (Signed)
Encounter addended by: Darron Doom, RN on: 05/01/2017 11:17 AM  Actions taken: Pharmacy for encounter modified, Order list changed, Diagnosis association updated, Sign clinical note

## 2017-05-02 ENCOUNTER — Other Ambulatory Visit: Payer: Self-pay

## 2017-05-02 LAB — BASIC METABOLIC PANEL
ANION GAP: 10 (ref 5–15)
BUN: 90 mg/dL — ABNORMAL HIGH (ref 6–20)
CHLORIDE: 99 mmol/L — AB (ref 101–111)
CO2: 22 mmol/L (ref 22–32)
Calcium: 8.3 mg/dL — ABNORMAL LOW (ref 8.9–10.3)
Creatinine, Ser: 2.87 mg/dL — ABNORMAL HIGH (ref 0.61–1.24)
GFR calc non Af Amer: 20 mL/min — ABNORMAL LOW (ref >60)
GFR, EST AFRICAN AMERICAN: 23 mL/min — AB (ref >60)
Glucose, Bld: 138 mg/dL — ABNORMAL HIGH (ref 65–99)
POTASSIUM: 4.7 mmol/L (ref 3.5–5.1)
SODIUM: 131 mmol/L — AB (ref 135–145)

## 2017-05-02 LAB — GLUCOSE, CAPILLARY
GLUCOSE-CAPILLARY: 114 mg/dL — AB (ref 65–99)
Glucose-Capillary: 178 mg/dL — ABNORMAL HIGH (ref 65–99)
Glucose-Capillary: 132 mg/dL — ABNORMAL HIGH (ref 65–99)
Glucose-Capillary: 103 mg/dL — ABNORMAL HIGH (ref 65–99)
Glucose-Capillary: 56 mg/dL — ABNORMAL LOW (ref 65–99)

## 2017-05-02 LAB — URINE CULTURE

## 2017-05-02 LAB — CBC
HEMATOCRIT: 32.6 % — AB (ref 39.0–52.0)
HEMOGLOBIN: 11 g/dL — AB (ref 13.0–17.0)
MCH: 30.4 pg (ref 26.0–34.0)
MCHC: 33.7 g/dL (ref 30.0–36.0)
MCV: 90.1 fL (ref 78.0–100.0)
PLATELETS: 230 10*3/uL (ref 150–400)
RBC: 3.62 MIL/uL — AB (ref 4.22–5.81)
RDW: 16.2 % — ABNORMAL HIGH (ref 11.5–15.5)
WBC: 8.7 10*3/uL (ref 4.0–10.5)

## 2017-05-02 MED ORDER — TRAZODONE HCL 50 MG PO TABS
50.0000 mg | ORAL_TABLET | Freq: Every evening | ORAL | Status: DC | PRN
  Administered 2017-05-02: 50 mg via ORAL
  Filled 2017-05-02: qty 1

## 2017-05-02 NOTE — Evaluation (Signed)
Physical Therapy Evaluation Patient Details Name: RICAHRD SCHWAGER MRN: [id number] DOB: [birth year] Today's Date: 05/02/2017   History of Present Illness  Pt is a 26 male admitted secondary AMS and AKI. CT of head was normal and abdominal imaging revealed mild to moderate bladder distention and L renal cyst. PMH includes CAD, ischemic cardiomyopathy, NSTEMI s/p stent and CABG, HTN, DM, CHF, a fib, and s/p cardioverter implant.   Clinical Impression  Pt admitted secondary to problem above with deficits below. Pt demonstrated unsteadiness and decreased safety awareness during gait, requiring min to min guard for mobility with RW. Pt's wife reports pt's AMS improved from admission, however, pt continues to present with cognitive deficits below. Pt's wife able to provide 24/7 assist at home. Will continue to follow acutely to maximize functional mobility independence and safety.     Follow Up Recommendations Home health PT;Supervision/Assistance - 24 hour    Equipment Recommendations  Rolling walker with 5" wheels    Recommendations for Other Services       Precautions / Restrictions Precautions Precautions: Fall Precaution Comments: Reports fall ~2 weeks ago, as pt's dog pulled him down.  Restrictions Weight Bearing Restrictions: No      Mobility  Bed Mobility Overal bed mobility: Needs Assistance Bed Mobility: Supine to Sit;Sit to Supine     Supine to sit: Min assist Sit to supine: Supervision   General bed mobility comments: Min A for trunk elevation. Supervision for safety to return to supine.   Transfers Overall transfer level: Needs assistance Equipment used: Rolling walker (2 wheeled) Transfers: Sit to/from Stand Sit to Stand: Min assist         General transfer comment: Min A for steadying assist upon standing. Required safety cues to wait for PT to get AD and to get lines set up before standing. Required safety cues X 3.   Ambulation/Gait Ambulation/Gait  assistance: Min guard Ambulation Distance (Feet): 125 Feet Assistive device: Rolling walker (2 wheeled) Gait Pattern/deviations: Step-through pattern;Decreased stride length Gait velocity: Decreased Gait velocity interpretation: Below normal speed for age/gender General Gait Details: Slow, slightly unsteady gait. Slight LOB noted with RW, requiring min guard for steadying assist. Verbal cues for safe use of RW and for upright posture throughout. Educated to use at home to increase safety with mobility.   Stairs            Wheelchair Mobility    Modified Rankin (Stroke Patients Only)       Balance Overall balance assessment: Needs assistance Sitting-balance support: No upper extremity supported;Feet supported Sitting balance-Leahy Scale: Good     Standing balance support: Bilateral upper extremity supported;During functional activity Standing balance-Leahy Scale: Poor Standing balance comment: Reliant on BUE support.                              Pertinent Vitals/Pain Pain Assessment: No/denies pain    Home Living Family/patient expects to be discharged to:: Private residence Living Arrangements: Spouse/significant other Available Help at Discharge: Family;Available 24 hours/day Type of Home: House Home Access: Stairs to enter Entrance Stairs-Rails: None Entrance Stairs-Number of Steps: 1 Home Layout: One level Home Equipment: Walker - 4 wheels;Grab bars - tub/shower(shower stool )      Prior Function Level of Independence: Independent               Hand Dominance   Dominant Hand: Left    Extremity/Trunk Assessment   Upper Extremity Assessment Upper Extremity  Assessment: Generalized weakness    Lower Extremity Assessment Lower Extremity Assessment: Generalized weakness    Cervical / Trunk Assessment Cervical / Trunk Assessment: Kyphotic  Communication   Communication: No difficulties  Cognition Arousal/Alertness:  Awake/alert Behavior During Therapy: WFL for tasks assessed/performed Overall Cognitive Status: Impaired/Different from baseline Area of Impairment: Safety/judgement;Orientation;Memory;Following commands;Problem solving                 Orientation Level: Disoriented to;Situation   Memory: Decreased short-term memory Following Commands: Follows one step commands with increased time Safety/Judgement: Decreased awareness of safety;Decreased awareness of deficits   Problem Solving: Slow processing;Decreased initiation;Difficulty sequencing;Requires verbal cues;Requires tactile cues General Comments: Pt's wife reports AMS has improved, however, pt still with some deficits. Required safety cues throughout session to wait for PT to mobilize. Also requried increased time to follow commands during session.       General Comments General comments (skin integrity, edema, etc.): Pt's wife present throughout session. Educated about HHPT and equipment recommendations and pt and family agreeable.     Exercises     Assessment/Plan    PT Assessment Patient needs continued PT services  PT Problem List Decreased strength;Decreased balance;Decreased cognition;Decreased mobility;Decreased safety awareness;Decreased knowledge of use of DME;Decreased knowledge of precautions       PT Treatment Interventions DME instruction;Gait training;Stair training;Therapeutic activities;Therapeutic exercise;Functional mobility training;Balance training;Neuromuscular re-education;Patient/family education;Cognitive remediation    PT Goals (Current goals can be found in the Care Plan section)  Acute Rehab PT Goals Patient Stated Goal: to get better  PT Goal Formulation: With patient/family Time For Goal Achievement: 05/16/17 Potential to Achieve Goals: Good    Frequency Min 3X/week   Barriers to discharge        Co-evaluation               AM-PAC PT "6 Clicks" Daily Activity  Outcome Measure  Difficulty turning over in bed (including adjusting bedclothes, sheets and blankets)?: A Little Difficulty moving from lying on back to sitting on the side of the bed? : Unable Difficulty sitting down on and standing up from a chair with arms (e.g., wheelchair, bedside commode, etc,.)?: Unable Help needed moving to and from a bed to chair (including a wheelchair)?: A Little Help needed walking in hospital room?: A Little Help needed climbing 3-5 steps with a railing? : A Lot 6 Click Score: 13    End of Session Equipment Utilized During Treatment: Gait belt Activity Tolerance: Patient tolerated treatment well Patient left: in bed;with call bell/phone within reach;with family/visitor present Nurse Communication: Mobility status PT Visit Diagnosis: Unsteadiness on feet (R26.81);Muscle weakness (generalized) (M62.81);Other symptoms and signs involving the nervous system (R29.898)    Time: 1027-2536 PT Time Calculation (min) (ACUTE ONLY): 23 min   Charges:   PT Evaluation $PT Eval Moderate Complexity: 1 Mod PT Treatments $Gait Training: 8-22 mins   PT G Codes:        Leighton Ruff, PT, DPT  Acute Rehabilitation Services  Pager: [phone]   Rudean Hitt 05/02/2017, 6:46 PM

## 2017-05-02 NOTE — Progress Notes (Signed)
PROGRESS NOTE    BRYSTON COLOCHO  PYK:[id number] DOB: 1941/01/30 DOA: 05/01/2017 PCP: Mayra Neer, MD     Brief Narrative:  [name] is a 77 y.o. male with medical history significant of ischemic cardiomyopathy, chronic systolic and diastolic heart failure, diabetes mellitus insulin-dependent, essential hypertension, hyperlipidemia, CAD, chronic kidney disease stage III.  Patient presented with 3-day history of worsening confusion.  Two weeks ago, the patient saw his cardiologist who increased his Lasix from 40 mg twice daily to 80 mg in the morning and 60 mg at night.  He was scheduled to see cardiac rehab for recheck of his labs and subsequently went to his primary care physician for evaluation of his confusion.  While at his PCPs office, he labs were obtained and family was told to present to the emergency department for evaluation and management. In the ED, labs revealed AKI with Cr 3.   Assessment & Plan:   Principal Problem:   Acute kidney injury ([place]) Active Problems:   Ischemic cardiomyopathy   Hyperlipemia   CAD (coronary artery disease)   HTN (hypertension)   DM (diabetes mellitus) ([place])   Chronic systolic CHF (congestive heart failure) (HCC)   Confusion   Acute metabolic encephalopathy -Likely secondary to uremia -Improved, back to baseline this morning on examination   Acute kidney injury on CKD 3 -Baseline creatinine of 1.3-1.4 -CT abd/pelvis revealed mild to moderate bladder distention as cannot exclude a degree of bladder outlet obstruction. There is associated mild right-sided hydronephrosis with dilatation of the proximal to mid ureter likely related to the distended bladder. No definite evidence of ureteral obstruction or stone. -Combination of hypovolemia due to overdiuresis as well as obstruction -Continue to monitor BMP  -Place foley to relieve obstruction, gentle IVF   Ischemic cardiomyopathy / Compensated chronic systolic and diastolic heart  failure -EF 25-30% with grade 2 diastolic dysfunction and severe hypokinesis on echocardiogram from November 2017.  Patient is status post Medtr[employer] ICD.  Patient follows with [facility] as an outpatient. -Hold furosemide, eplerenone, and lisinopril in setting of AKI  -Strict in and out/daily weights -Continue coreg   Hyperlipidemia CAD -Continue Aspirin, Plavix, Ranexa and Lipitor  Diabetes mellitus, insulin-dependent, uncontrolled with hyperglycemia -Per chart review, difficult to get a good handle of patient's outpatient regimen.  He was last seen by endocrinology in 2017.  He takes U 100 and U 500 insulin. -Reduced dose of insulin 50/50 to 10 units TID WC -Sliding scale moderate  Prolonged QT -Manually calculated to be 514. Patient with ICD implanted. -Avoid QT prolonging medications   DVT prophylaxis: Subq hep Code Status: DNR Family Communication: Wife at bedside Disposition Plan: Pending improvement   Consultants:   None  Procedures:   None   Antimicrobials:  Anti-infectives (From admission, onward)   None       Subjective: Doing well this morning.  Mentation is back to baseline per wife at bedside.  He has no complaints of chest pain, shortness of breath, nausea or vomiting.  Ate breakfast this morning. He admits to urinary frequency and urgency but not pelvic pressure or feeling that he cannot void.   Objective: Vitals:   05/01/17 1845 05/01/17 2110 05/02/17 0436 05/02/17 0811  BP:  110/69 (!) 102/54 (!) 104/56  Pulse: 95 90 67 70  Resp: 17 16 17 17   Temp:  98.4 F (36.9 C) 98.6 F (37 C) 98.4 F (36.9 C)  TempSrc:  Oral Oral Oral  SpO2: 96% 99% 97% 98%  Intake/Output Summary (Last 24 hours) at 05/02/2017 1203 Last data filed at 05/02/2017 0811 Gross per 24 hour  Intake 740 ml  Output 120 ml  Net 620 ml   There were no vitals filed for this visit.  Examination:  General exam: Appears calm and comfortable  Respiratory system: Clear  to auscultation. Respiratory effort normal. Cardiovascular system: S1 & S2 heard, RRR. No JVD, murmurs, rubs, gallops or clicks. No pedal edema. Gastrointestinal system: Abdomen is nondistended, soft and nontender. No organomegaly or masses felt. Normal bowel sounds heard. Central nervous system: Alert and oriented. No focal neurological deficits. Extremities: Symmetric 5 x 5 power. Skin: No rashes, lesions or ulcers Psychiatry: Judgement and insight appear normal. Mood & affect appropriate.   Data Reviewed: I have personally reviewed following labs and imaging studies  CBC: Recent Labs  Lab 05/01/17 1318 05/01/17 1355  WBC 13.6*  --   NEUTROABS 10.7*  --   HGB 13.9 15.3  HCT 41.8 45.0  MCV 89.5  --   PLT 303  --    Basic Metabolic Panel: Recent Labs  Lab 05/01/17 1107 05/01/17 1318 05/01/17 1355  NA 130* 129* 132*  K 5.2* 5.2* 5.1  CL 96* 94* 97*  CO2 21* 20*  --   GLUCOSE 290* 309* 296*  BUN 92* 91* 79*  CREATININE 3.20* 3.11* 3.00*  CALCIUM 9.2 9.7  --    GFR: CrCl cannot be calculated (Unknown ideal weight.). Liver Function Tests: Recent Labs  Lab 05/01/17 1318  AST 22  ALT 25  ALKPHOS 102  BILITOT 0.8  PROT 8.0  ALBUMIN 4.3   Recent Labs  Lab 05/01/17 1318  LIPASE 45   Recent Labs  Lab 05/01/17 1318  AMMONIA 19   Coagulation Profile: No results for input(s): INR, PROTIME in the last 168 hours. Cardiac Enzymes: No results for input(s): CKTOTAL, CKMB, CKMBINDEX, TROPONINI in the last 168 hours. BNP (last 3 results) No results for input(s): PROBNP in the last 8760 hours. HbA1C: No results for input(s): HGBA1C in the last 72 hours. CBG: Recent Labs  Lab 05/01/17 2107 05/02/17 0747 05/02/17 1137  GLUCAP 233* 178* 132*   Lipid Profile: No results for input(s): CHOL, HDL, LDLCALC, TRIG, CHOLHDL, LDLDIRECT in the last 72 hours. Thyroid Function Tests: No results for input(s): TSH, T4TOTAL, FREET4, T3FREE, THYROIDAB in the last 72  hours. Anemia Panel: No results for input(s): VITAMINB12, FOLATE, FERRITIN, TIBC, IRON, RETICCTPCT in the last 72 hours. Sepsis Labs: Recent Labs  Lab 05/01/17 1355  LATICACIDVEN 1.88    No results found for this or any previous visit (from the past 240 hour(s)).     Radiology Studies: Ct Abdomen Pelvis Wo Contrast  Result Date: 05/01/2017 CLINICAL DATA:  Nausea and vomiting with hypotension and weakness. Altered mental status. EXAM: CT ABDOMEN AND PELVIS WITHOUT CONTRAST TECHNIQUE: Multidetector CT imaging of the abdomen and pelvis was performed following the standard protocol without IV contrast. COMPARISON:  12/24/2011 FINDINGS: Lower chest: Lung bases are within normal. There is moderate cardiomegaly. Cardiac pacer leads are present. Previous median sternotomy. Hepatobiliary: Previous cholecystectomy. Liver and biliary tree are unremarkable. Pancreas: Within normal. Spleen: Within normal. Adrenals/Urinary Tract: Adrenal glands are normal. Kidneys normal size with 1 cm hypodensity over the upper pole cortex of the left kidney compatible with a cyst. No nephrolithiasis. Left ureter is normal. Mild right-sided hydronephrosis with mild dilatation of the proximal to mid right ureter as the distal right ureter is normal. Mild to moderate bladder distention. Stomach/Bowel:  Stomach and small bowel are within normal. Appendix is normal. There is mild diverticulosis of the colon. Vascular/Lymphatic: Mild calcified plaque over the abdominal aorta and iliac arteries. No adenopathy. Reproductive: Within normal. Other: No free fluid or focal inflammatory change. Metallic clip over the right inguinal region. Musculoskeletal: Degenerate change of the spine and hips. IMPRESSION: Mild to moderate bladder distention as cannot exclude a degree of bladder outlet obstruction. There is associated mild right-sided hydronephrosis with dilatation of the proximal to mid ureter likely related to the distended bladder. No  definite evidence of ureteral obstruction or stone. 1 cm left renal cyst. Mild diverticulosis of the colon. Aortic Atherosclerosis (ICD10-I70.0). Cardiomegaly. Electronically Signed   By: Marin Olp M.D.   On: 05/01/2017 16:58   Ct Head Wo Contrast  Result Date: 05/01/2017 CLINICAL DATA:  Altered mental status. EXAM: CT HEAD WITHOUT CONTRAST TECHNIQUE: Contiguous axial images were obtained from the base of the skull through the vertex without intravenous contrast. COMPARISON:  06/18/2015 FINDINGS: Brain: Ventricles, cisterns and other CSF spaces are within normal. There is no mass, mass effect, shift of midline structures or acute hemorrhage. There is no evidence of acute infarction. Vascular: No hyperdense vessel or unexpected calcification. Skull: Normal. Negative for fracture or focal lesion. Sinuses/Orbits: Orbits are within normal. Paranasal sinuses are well developed and well aerated. Subtle mucosal membrane thickening involving the left sphenoid sinus. Other: None. IMPRESSION: Normal head CT. Subtle chronic inflammatory change of the left maxillary and sphenoid sinuses. Electronically Signed   By: Marin Olp M.D.   On: 05/01/2017 14:20   Dg Chest Port 1 View  Result Date: 05/01/2017 CLINICAL DATA:  Hypertension, weakness, mental status change, headache. History of coronary artery disease and diabetes EXAM: PORTABLE CHEST 1 VIEW COMPARISON:  Portable chest x-ray of August 01, 2015 FINDINGS: The lungs are reasonably well inflated. There is no focal infiltrate. The cardiac silhouette is enlarged. The pulmonary vascularity is mildly prominent. There is no significant pulmonary edema. The ICD is in stable position. The sternal wires are intact. There calcification in the wall of the aortic arch. IMPRESSION: Low-grade compensated CHF.  No acute pulmonary edema or pneumonia. Thoracic aortic atherosclerosis.  Previous CABG. Electronically Signed   By: David  Martinique M.D.   On: 05/01/2017 13:58       Scheduled Meds: . allopurinol  100 mg Oral Daily  . aspirin EC  81 mg Oral Daily  . atorvastatin  80 mg Oral q1800  . carvedilol  25 mg Oral BID WC  . clopidogrel  75 mg Oral Daily  . heparin  5,000 Units Subcutaneous Q8H  . insulin aspart  0-15 Units Subcutaneous TID WC  . insulin aspart protamine- aspart  15 Units Subcutaneous BID WC  . insulin aspart protamine- aspart  30 Units Subcutaneous Q supper  . pantoprazole  40 mg Oral Daily  . ranolazine  500 mg Oral BID  . venlafaxine XR  150 mg Oral QHS   Continuous Infusions: . sodium chloride 50 mL/hr at 05/01/17 1901     LOS: 1 day    Time spent: 40 minutes   Dessa Phi, DO [facility] [email or web address] Password [facility] 05/02/2017, 12:03 PM

## 2017-05-03 LAB — GLUCOSE, CAPILLARY
GLUCOSE-CAPILLARY: 135 mg/dL — AB (ref 65–99)
GLUCOSE-CAPILLARY: 52 mg/dL — AB (ref 65–99)
Glucose-Capillary: 106 mg/dL — ABNORMAL HIGH (ref 65–99)
Glucose-Capillary: 87 mg/dL (ref 65–99)
Glucose-Capillary: 56 mg/dL — ABNORMAL LOW (ref 65–99)
Glucose-Capillary: 184 mg/dL — ABNORMAL HIGH (ref 65–99)
Glucose-Capillary: 154 mg/dL — ABNORMAL HIGH (ref 65–99)

## 2017-05-03 LAB — CBC
HEMATOCRIT: 37.3 % — AB (ref 39.0–52.0)
HEMOGLOBIN: 12.1 g/dL — AB (ref 13.0–17.0)
MCH: 29.7 pg (ref 26.0–34.0)
MCHC: 32.4 g/dL (ref 30.0–36.0)
MCV: 91.4 fL (ref 78.0–100.0)
Platelets: 226 10*3/uL (ref 150–400)
RBC: 4.08 MIL/uL — AB (ref 4.22–5.81)
RDW: 16.1 % — ABNORMAL HIGH (ref 11.5–15.5)
WBC: 11 10*3/uL — ABNORMAL HIGH (ref 4.0–10.5)

## 2017-05-03 LAB — BASIC METABOLIC PANEL
Anion gap: 10 (ref 5–15)
BUN: 66 mg/dL — AB (ref 6–20)
CHLORIDE: 107 mmol/L (ref 101–111)
CO2: 19 mmol/L — AB (ref 22–32)
Calcium: 8.7 mg/dL — ABNORMAL LOW (ref 8.9–10.3)
Creatinine, Ser: 1.99 mg/dL — ABNORMAL HIGH (ref 0.61–1.24)
GFR calc Af Amer: 36 mL/min — ABNORMAL LOW (ref >60)
GFR calc non Af Amer: 31 mL/min — ABNORMAL LOW (ref >60)
GLUCOSE: 90 mg/dL (ref 65–99)
POTASSIUM: 4.1 mmol/L (ref 3.5–5.1)
Sodium: 136 mmol/L (ref 135–145)

## 2017-05-03 MED ORDER — INSULIN ASPART PROT & ASPART (70-30 MIX) 100 UNIT/ML ~~LOC~~ SUSP: 15.0000 [IU] | Freq: Every day | SUBCUTANEOUS | Status: DC

## 2017-05-03 MED ORDER — DEXTROSE 50 % IV SOLN
INTRAVENOUS | Status: AC
  Administered 2017-05-03: 50 mL
  Filled 2017-05-03: qty 50

## 2017-05-03 NOTE — Progress Notes (Signed)
Hypoglycemic Event CBG 52, gave snack Follow-up CBG was 56, gave amp D50 Follow-up CBG 184  70/30 decreased

## 2017-05-03 NOTE — Progress Notes (Signed)
PROGRESS NOTE    [name]  OTL:[id number] DOB: 1940/11/02 DOA: 05/01/2017 PCP: Mayra Neer, MD     Brief Narrative:  [name] is a 77 y.o. male with medical history significant of ischemic cardiomyopathy, chronic systolic and diastolic heart failure, diabetes mellitus insulin-dependent, essential hypertension, hyperlipidemia, CAD, chronic kidney disease stage III.  Patient presented with 3-day history of worsening confusion.  Two weeks ago, the patient saw his cardiologist who increased his Lasix from 40 mg twice daily to 80 mg in the morning and 60 mg at night.  He was scheduled to see cardiac rehab for recheck of his labs and subsequently went to his primary care physician for evaluation of his confusion.  While at his PCPs office, he labs were obtained and family was told to present to the emergency department for evaluation and management. In the ED, labs revealed AKI with Cr 3.   Assessment & Plan:   Principal Problem:   Acute kidney injury ([place]) Active Problems:   Ischemic cardiomyopathy   Hyperlipemia   CAD (coronary artery disease)   HTN (hypertension)   DM (diabetes mellitus) ([place])   Chronic systolic CHF (congestive heart failure) (HCC)   Confusion   Acute metabolic encephalopathy -Likely secondary to uremia -Improved, back to baseline  Acute kidney injury on CKD 3 -Baseline creatinine of 1.3-1.4 -CT abd/pelvis revealed mild to moderate bladder distention as cannot exclude a degree of bladder outlet obstruction. There is associated mild right-sided hydronephrosis with dilatation of the proximal to mid ureter likely related to the distended bladder. No definite evidence of ureteral obstruction or stone. -Combination of hypovolemia due to overdiuresis as well as obstruction -Foley placed yesterday, had >2L UOP since. Cr improving. Will trial foley removal and void trial today. Bladder scans ordered  -Stop IVF today   Ischemic cardiomyopathy / Compensated  chronic systolic and diastolic heart failure -EF 25-30% with grade 2 diastolic dysfunction and severe hypokinesis on echocardiogram from November 2017.  Patient is status post Medtr[employer] ICD.  Patient follows with [facility] as an outpatient. -Hold furosemide, eplerenone, and lisinopril in setting of AKI  -Strict in and out/daily weights -Continue coreg   Hyperlipidemia CAD -Continue Aspirin, Plavix, Ranexa and Lipitor  Diabetes mellitus, insulin-dependent, uncontrolled with hyperglycemia -Per chart review, difficult to get a good handle of patient's outpatient regimen.  He was last seen by endocrinology in 2017.  He takes U 100 and U 500 insulin. -Reduced dose of insulin 50/50 to 10 units TID WC -Sliding scale moderate  Prolonged QT -Manually calculated to be 514. Patient with ICD implanted. -Avoid QT prolonging medications  Insomnia -Wife reports that patient has been on ambien for well over 10 years. He is currently receiving CR 12.5mg  daily. She also reports that due to [facility], patient has exhibited paranoid delusions, hallucinations (chronic issue). Discussed need for weaning off this medication in this 77 yo male.    DVT prophylaxis: Subq hep Code Status: DNR Family Communication: Wife at bedside Disposition Plan: Pending improvement   Consultants:   None  Procedures:   None   Antimicrobials:  Anti-infectives (From admission, onward)   None       Subjective: No new complaints. Wife reports that patient did not sleep much overnight.   Objective: Vitals:   05/02/17 2140 05/03/17 0107 05/03/17 0408 05/03/17 0949  BP: (!) 111/59  112/68 (!) 102/59  Pulse: 64  68 65  Resp: 16  16 18   Temp: 97.9 F (36.6 C)  98.1  F (36.7 C) 98 F (36.7 C)  TempSrc: Oral  Oral Oral  SpO2: 97%  97% 98%  Weight:  88.3 kg (194 lb 10.7 oz)      Intake/Output Summary (Last 24 hours) at 05/03/2017 1224 Last data filed at 05/03/2017 1209 Gross per 24 hour  Intake 3369.17  ml  Output 3950 ml  Net -580.83 ml   Filed Weights   05/03/17 0107  Weight: 88.3 kg (194 lb 10.7 oz)    Examination:  General exam: Appears calm and comfortable  Respiratory system: Clear to auscultation. Respiratory effort normal. Cardiovascular system: S1 & S2 heard, RRR. No JVD, murmurs, rubs, gallops or clicks. No pedal edema. Gastrointestinal system: Abdomen is nondistended, soft and nontender. No organomegaly or masses felt. Normal bowel sounds heard. Central nervous system: Alert and oriented. No focal neurological deficits. Extremities: Symmetric 5 x 5 power. Skin: No rashes, lesions or ulcers Psychiatry: Judgement and insight appear normal. Mood & affect appropriate.   Data Reviewed: I have personally reviewed following labs and imaging studies  CBC: Recent Labs  Lab 05/01/17 1318 05/01/17 1355 05/02/17 1211 05/03/17 0749  WBC 13.6*  --  8.7 11.0*  NEUTROABS 10.7*  --   --   --   HGB 13.9 15.3 11.0* 12.1*  HCT 41.8 45.0 32.6* 37.3*  MCV 89.5  --  90.1 91.4  PLT 303  --  230 161   Basic Metabolic Panel: Recent Labs  Lab 05/01/17 1107 05/01/17 1318 05/01/17 1355 05/02/17 1211 05/03/17 0749  NA 130* 129* 132* 131* 136  K 5.2* 5.2* 5.1 4.7 4.1  CL 96* 94* 97* 99* 107  CO2 21* 20*  --  22 19*  GLUCOSE 290* 309* 296* 138* 90  BUN 92* 91* 79* 90* 66*  CREATININE 3.20* 3.11* 3.00* 2.87* 1.99*  CALCIUM 9.2 9.7  --  8.3* 8.7*   GFR: Estimated Creatinine Clearance: 36.4 mL/min (A) (by C-G formula based on SCr of 1.99 mg/dL (H)). Liver Function Tests: Recent Labs  Lab 05/01/17 1318  AST 22  ALT 25  ALKPHOS 102  BILITOT 0.8  PROT 8.0  ALBUMIN 4.3   Recent Labs  Lab 05/01/17 1318  LIPASE 45   Recent Labs  Lab 05/01/17 1318  AMMONIA 19   Coagulation Profile: No results for input(s): INR, PROTIME in the last 168 hours. Cardiac Enzymes: No results for input(s): CKTOTAL, CKMB, CKMBINDEX, TROPONINI in the last 168 hours. BNP (last 3 results) No  results for input(s): PROBNP in the last 8760 hours. HbA1C: No results for input(s): HGBA1C in the last 72 hours. CBG: Recent Labs  Lab 05/02/17 2131 05/02/17 2205 05/03/17 0405 05/03/17 0737 05/03/17 1123  GLUCAP 56* 114* 106* 87 135*   Lipid Profile: No results for input(s): CHOL, HDL, LDLCALC, TRIG, CHOLHDL, LDLDIRECT in the last 72 hours. Thyroid Function Tests: No results for input(s): TSH, T4TOTAL, FREET4, T3FREE, THYROIDAB in the last 72 hours. Anemia Panel: No results for input(s): VITAMINB12, FOLATE, FERRITIN, TIBC, IRON, RETICCTPCT in the last 72 hours. Sepsis Labs: Recent Labs  Lab 05/01/17 1355  LATICACIDVEN 1.88    Recent Results (from the past 240 hour(s))  Blood Culture (routine x 2)     Status: None (Preliminary result)   Collection Time: 05/01/17  1:33 PM  Result Value Ref Range Status   Specimen Description BLOOD RIGHT ANTECUBITAL  Final   Special Requests   Final    BOTTLES DRAWN AEROBIC AND ANAEROBIC Blood Culture results may not be optimal due  to an excessive volume of blood received in culture bottles   Culture NO GROWTH 2 DAYS  Final   Report Status PENDING  Incomplete  Blood Culture (routine x 2)     Status: None (Preliminary result)   Collection Time: 05/01/17  1:40 PM  Result Value Ref Range Status   Specimen Description BLOOD RIGHT HAND  Final   Special Requests   Final    BOTTLES DRAWN AEROBIC AND ANAEROBIC Blood Culture adequate volume   Culture NO GROWTH 2 DAYS  Final   Report Status PENDING  Incomplete  Urine culture     Status: Abnormal   Collection Time: 05/01/17  3:05 PM  Result Value Ref Range Status   Specimen Description URINE, RANDOM  Final   Special Requests NONE  Final   Culture MULTIPLE SPECIES PRESENT, SUGGEST RECOLLECTION (A)  Final   Report Status 05/02/2017 FINAL  Final       Radiology Studies: Ct Abdomen Pelvis Wo Contrast  Result Date: 05/01/2017 CLINICAL DATA:  Nausea and vomiting with hypotension and weakness.  Altered mental status. EXAM: CT ABDOMEN AND PELVIS WITHOUT CONTRAST TECHNIQUE: Multidetector CT imaging of the abdomen and pelvis was performed following the standard protocol without IV contrast. COMPARISON:  12/24/2011 FINDINGS: Lower chest: Lung bases are within normal. There is moderate cardiomegaly. Cardiac pacer leads are present. Previous median sternotomy. Hepatobiliary: Previous cholecystectomy. Liver and biliary tree are unremarkable. Pancreas: Within normal. Spleen: Within normal. Adrenals/Urinary Tract: Adrenal glands are normal. Kidneys normal size with 1 cm hypodensity over the upper pole cortex of the left kidney compatible with a cyst. No nephrolithiasis. Left ureter is normal. Mild right-sided hydronephrosis with mild dilatation of the proximal to mid right ureter as the distal right ureter is normal. Mild to moderate bladder distention. Stomach/Bowel: Stomach and small bowel are within normal. Appendix is normal. There is mild diverticulosis of the colon. Vascular/Lymphatic: Mild calcified plaque over the abdominal aorta and iliac arteries. No adenopathy. Reproductive: Within normal. Other: No free fluid or focal inflammatory change. Metallic clip over the right inguinal region. Musculoskeletal: Degenerate change of the spine and hips. IMPRESSION: Mild to moderate bladder distention as cannot exclude a degree of bladder outlet obstruction. There is associated mild right-sided hydronephrosis with dilatation of the proximal to mid ureter likely related to the distended bladder. No definite evidence of ureteral obstruction or stone. 1 cm left renal cyst. Mild diverticulosis of the colon. Aortic Atherosclerosis (ICD10-I70.0). Cardiomegaly. Electronically Signed   By: Marin Olp M.D.   On: 05/01/2017 16:58   Ct Head Wo Contrast  Result Date: 05/01/2017 CLINICAL DATA:  Altered mental status. EXAM: CT HEAD WITHOUT CONTRAST TECHNIQUE: Contiguous axial images were obtained from the base of the skull  through the vertex without intravenous contrast. COMPARISON:  06/18/2015 FINDINGS: Brain: Ventricles, cisterns and other CSF spaces are within normal. There is no mass, mass effect, shift of midline structures or acute hemorrhage. There is no evidence of acute infarction. Vascular: No hyperdense vessel or unexpected calcification. Skull: Normal. Negative for fracture or focal lesion. Sinuses/Orbits: Orbits are within normal. Paranasal sinuses are well developed and well aerated. Subtle mucosal membrane thickening involving the left sphenoid sinus. Other: None. IMPRESSION: Normal head CT. Subtle chronic inflammatory change of the left maxillary and sphenoid sinuses. Electronically Signed   By: Marin Olp M.D.   On: 05/01/2017 14:20   Dg Chest Port 1 View  Result Date: 05/01/2017 CLINICAL DATA:  Hypertension, weakness, mental status change, headache. History of coronary artery  disease and diabetes EXAM: PORTABLE CHEST 1 VIEW COMPARISON:  Portable chest x-ray of August 01, 2015 FINDINGS: The lungs are reasonably well inflated. There is no focal infiltrate. The cardiac silhouette is enlarged. The pulmonary vascularity is mildly prominent. There is no significant pulmonary edema. The ICD is in stable position. The sternal wires are intact. There calcification in the wall of the aortic arch. IMPRESSION: Low-grade compensated CHF.  No acute pulmonary edema or pneumonia. Thoracic aortic atherosclerosis.  Previous CABG. Electronically Signed   By: David  Martinique M.D.   On: 05/01/2017 13:58      Scheduled Meds: . allopurinol  100 mg Oral Daily  . aspirin EC  81 mg Oral Daily  . atorvastatin  80 mg Oral q1800  . carvedilol  25 mg Oral BID WC  . clopidogrel  75 mg Oral Daily  . heparin  5,000 Units Subcutaneous Q8H  . insulin aspart  0-15 Units Subcutaneous TID WC  . insulin aspart protamine- aspart  15 Units Subcutaneous BID WC  . insulin aspart protamine- aspart  30 Units Subcutaneous Q supper  .  pantoprazole  40 mg Oral Daily  . ranolazine  500 mg Oral BID  . venlafaxine XR  150 mg Oral QHS   Continuous Infusions:    LOS: 2 days    Time spent: 30 minutes   Dessa Phi, DO [facility] [email or web address] Password [facility] 05/03/2017, 12:24 PM

## 2017-05-04 LAB — BASIC METABOLIC PANEL
ANION GAP: 9 (ref 5–15)
BUN: 42 mg/dL — ABNORMAL HIGH (ref 6–20)
CALCIUM: 8.4 mg/dL — AB (ref 8.9–10.3)
CHLORIDE: 103 mmol/L (ref 101–111)
CO2: 19 mmol/L — AB (ref 22–32)
Creatinine, Ser: 1.65 mg/dL — ABNORMAL HIGH (ref 0.61–1.24)
GFR calc Af Amer: 46 mL/min — ABNORMAL LOW (ref >60)
GFR calc non Af Amer: 39 mL/min — ABNORMAL LOW (ref >60)
GLUCOSE: 283 mg/dL — AB (ref 65–99)
Potassium: 4.5 mmol/L (ref 3.5–5.1)
Sodium: 131 mmol/L — ABNORMAL LOW (ref 135–145)

## 2017-05-04 LAB — CBC
HEMATOCRIT: 33 % — AB (ref 39.0–52.0)
Hemoglobin: 10.8 g/dL — ABNORMAL LOW (ref 13.0–17.0)
MCH: 29.8 pg (ref 26.0–34.0)
MCHC: 32.7 g/dL (ref 30.0–36.0)
MCV: 90.9 fL (ref 78.0–100.0)
Platelets: 184 10*3/uL (ref 150–400)
RBC: 3.63 MIL/uL — ABNORMAL LOW (ref 4.22–5.81)
RDW: 16.1 % — AB (ref 11.5–15.5)
WBC: 10.1 10*3/uL (ref 4.0–10.5)

## 2017-05-04 LAB — GLUCOSE, CAPILLARY
Glucose-Capillary: 273 mg/dL — ABNORMAL HIGH (ref 65–99)
Glucose-Capillary: 155 mg/dL — ABNORMAL HIGH (ref 65–99)

## 2017-05-04 NOTE — Care Management Important Message (Signed)
Important Message  Patient Details  Name: [name] MRN: [id number] Date of Birth: January 12, 1941   Medicare Important Message Given:  Yes    Orbie Pyo 05/04/2017, 2:14 PM

## 2017-05-04 NOTE — Progress Notes (Signed)
Patient discharged to home, AVS reviewed with patient and wife, IV removed, patient left floor via wheelchair with staff member

## 2017-05-04 NOTE — Progress Notes (Signed)
Physical Therapy Treatment Patient Details Name: [name] MRN: [id number] DOB: December 27, 1940 Today's Date: 05/04/2017    History of Present Illness Pt is a 77 male admitted secondary AMS and AKI. CT of head was normal and abdominal imaging revealed mild to moderate bladder distention and L renal cyst. PMH includes CAD, ischemic cardiomyopathy, NSTEMI s/p stent and CABG, HTN, DM, CHF, a fib, and s/p cardioverter implant.     PT Comments    Pt making steady progress with functional mobility. He remains impulsive and unsafe, requiring min A to ambulate with RW. Pt would continue to benefit from skilled physical therapy services at this time while admitted and after d/c to address the below listed limitations in order to improve overall safety and independence with functional mobility.    Follow Up Recommendations  Home health PT;Supervision/Assistance - 24 hour     Equipment Recommendations  None recommended by PT;Other (comment)(pt has RW at home)    Recommendations for Other Services       Precautions / Restrictions Precautions Precautions: Fall Precaution Comments: Reports fall ~2 weeks ago, as pt's dog pulled him down.  Restrictions Weight Bearing Restrictions: No    Mobility  Bed Mobility Overal bed mobility: Modified Independent                Transfers Overall transfer level: Needs assistance Equipment used: None Transfers: Sit to/from Stand Sit to Stand: Min guard         General transfer comment: min guard for safety  Ambulation/Gait Ambulation/Gait assistance: Min assist Ambulation Distance (Feet): 150 Feet Assistive device: Rolling walker (2 wheeled) Gait Pattern/deviations: Step-through pattern;Decreased stride length Gait velocity: Decreased Gait velocity interpretation: Below normal speed for age/gender General Gait Details: pt impulsive and unsafe with RW; pt required min A for stability and to maintain a safe distance from [employer] Mobility    Modified Rankin (Stroke Patients Only)       Balance Overall balance assessment: Needs assistance Sitting-balance support: No upper extremity supported;Feet supported Sitting balance-Leahy Scale: Good     Standing balance support: During functional activity;No upper extremity supported Standing balance-Leahy Scale: Fair                              Cognition Arousal/Alertness: Awake/alert Behavior During Therapy: Impulsive Overall Cognitive Status: Impaired/Different from baseline Area of Impairment: Safety/judgement                         Safety/Judgement: Decreased awareness of deficits;Decreased awareness of safety            Exercises      General Comments        Pertinent Vitals/Pain Pain Assessment: No/denies pain    Home Living                      Prior Function            PT Goals (current goals can now be found in the care plan section) Acute Rehab PT Goals PT Goal Formulation: With patient/family Time For Goal Achievement: 05/16/17 Potential to Achieve Goals: Good Progress towards PT goals: Progressing toward goals    Frequency    Min 3X/week      PT Plan Current plan remains appropriate    Co-evaluation  AM-PAC PT "6 Clicks" Daily Activity  Outcome Measure  Difficulty turning over in bed (including adjusting bedclothes, sheets and blankets)?: None Difficulty moving from lying on back to sitting on the side of the bed? : None Difficulty sitting down on and standing up from a chair with arms (e.g., wheelchair, bedside commode, etc,.)?: A Little Help needed moving to and from a bed to chair (including a wheelchair)?: A Little Help needed walking in hospital room?: A Little Help needed climbing 3-5 steps with a railing? : A Lot 6 Click Score: 19    End of Session Equipment Utilized During Treatment: Gait belt Activity Tolerance: Patient tolerated  treatment well Patient left: in bed;with call bell/phone within reach;with family/visitor present Nurse Communication: Mobility status PT Visit Diagnosis: Unsteadiness on feet (R26.81);Muscle weakness (generalized) (M62.81);Other symptoms and signs involving the nervous system (C94.709)     Time: 6283-6629 PT Time Calculation (min) (ACUTE ONLY): 12 min  Charges:  $Gait Training: 8-22 mins                    G Codes:       [place], [place], [place] 05/04/2017, 1:22 PM

## 2017-05-04 NOTE — Care Management Note (Signed)
Case Management Note  Patient Details  Name: [name] MRN: [id number] Date of Birth: 1940/04/22  Subjective/Objective:   Acute metabolic Encephalopathy, AKI, CHF, HTN                 Action/Plan: Spoke to pt and wife at bedside. Offered choice for HH/list provided. Wife requested [facility] for [facility]. Contacted [facility] with new referral. Pt reports having RW at home.   PCP Mayra Neer MD   Expected Discharge Date:  05/04/17               Expected Discharge Plan:  [place]  In-House Referral:  NA  Discharge planning Services  CM Consult  Post Acute Care Choice:  Home Health Choice offered to:  Spouse, Patient  DME Arranged:  N/A DME Agency:  NA  HH Arranged:  PT [place] Agency:  [place]  Status of Service:  Completed, signed off  If discussed at [place] of Stay Meetings, dates discussed:    Additional Comments:  Erenest Rasher, RN 05/04/2017, 1:37 PM

## 2017-05-04 NOTE — Discharge Summary (Signed)
Physician Discharge Summary  [name] ACZ:[id number] DOB: 12-08-1940 DOA: 05/01/2017  PCP: Mayra Neer, MD  Admit date: 05/01/2017 Discharge date: 05/04/2017  Admitted From: Home Disposition:  Home  Recommendations for Outpatient Follow-up:  1. Follow up with PCP in 1 week 2. Follow up with Cardiology in 1 week  3. Please obtain BMP in 1 week to ensure improvement and stability of creatinine function 4. Continue to hold furosemide,eplerenone, and lisinopril in setting of AKI.  Resume these medications as outpatient once creatinine remains stable.  Patient without evidence of fluid overload or acute decompensation. 5. Recommend tapering off ambien. Wife states that it does not help with sleep and he has been on it >10 years. He has had paranoid delusions and hallucinations on this medication.  6. Would recommend outpatient referral for urology due to urinary retention in the hospital.  Discharge Condition: Stable CODE STATUS: DNR Diet recommendation: Heart healthy  Brief/Interim Summary: Brandon Hammerschmidt Joyceis a 77 y.o.malewith medical history significant ofischemic cardiomyopathy, chronic systolic and diastolic heart failure, diabetes mellitus insulin-dependent, essential hypertension, hyperlipidemia, CAD, chronic kidney disease stage III.Patient presented with 3-day history of worsening confusion. Two weeks ago, the patient saw his cardiologist who increased his Lasix from 40 mg twice daily to 80 mg in the morning and 60 mg at night. He was scheduled to see cardiac rehab for recheck of his labs and subsequently went to his primary care physician for evaluation of his confusion. While at his PCPs office, he labs were obtained and family was told to present to the emergency department for evaluation and management. In the ED, labs revealed AKI with Cr 3.   His medications including furosemide,eplerenone, and lisinopril were held in setting of AKI.  He was given IV fluids.  CT abdomen  pelvis revealed moderate bladder distention with right-sided hydronephrosis.  Foley was placed with over 2 L urine output.  Creatinine continued to improve.  Foley was removed and patient was able to void spontaneously.  Postvoid residual prior to discharge was less than 300 mL.  Cr continued to improve prior to discharge.  He is to follow-up with his primary care physician as well as heart failure team.  Discharge Diagnoses:  Principal Problem:   Acute kidney injury ([place]) Active Problems:   Ischemic cardiomyopathy   Hyperlipemia   CAD (coronary artery disease)   HTN (hypertension)   DM (diabetes mellitus) ([place])   Chronic systolic CHF (congestive heart failure) ([place])   Confusion   Acute metabolic encephalopathy -Likely secondary to uremia -Improved, back to baseline  Acute kidney injury on CKD 3 -Baseline creatinine of1.3-1.4 -CT abd/pelvis revealed mild to moderate bladder distention as cannot exclude a degree of bladder outlet obstruction. There is associated mild right-sided hydronephrosis with dilatation of the proximal to mid ureter likely related to the distended bladder. No definite evidence of ureteral obstruction or stone. -Combination of hypovolemia due to overdiuresis as well as obstruction -Foley placed had >2L UOP since. Foley was removed and patient was able to void spontaneously.  Postvoid residual prior to discharge was less than 300 mL.  -Cr improving.   Ischemic cardiomyopathy / Compensated chronic systolic and diastolic heart failure -EF 25-30% with grade 2 diastolic dysfunction and severe hypokinesis on echocardiogram from November 2017. Patient is status post [employer] ICD. Patient follows with [facility] heart care as an outpatient. -Hold furosemide, eplerenone, and lisinopril in setting of AKI  -Strict in and out/daily weights -Continue coreg  -Stable, no evidence of acute decompensation or fluid  overload   Hyperlipidemia CAD -Continue Aspirin, Plavix,  Ranexa and Lipitor  Diabetes mellitus, insulin-dependent, uncontrolledwith hyperglycemia -Per chart review, difficult to get a good handle of patient's outpatient regimen. He was last seen by endocrinology in 2017. He takes U 100 and U 500 insulin. -Reduced dose ofinsulin 50/50 to 10 units TID WC -Sliding scale moderate  Prolonged QT -Manually calculated to be 514. Patient with ICD implanted. -Avoid QT prolonging medications  Insomnia -Wife reports that patient has been on ambien for well over 10 years. He is currently receiving CR 12.5mg  daily. She also reports that due to [facility], patient has exhibited paranoid delusions, hallucinations (chronic issue). Discussed need for weaning off this medication in this 77 yo male.     Discharge Instructions  Discharge Instructions    (HEART FAILURE PATIENTS) Call MD:  Anytime you have any of the following symptoms: 1) 3 pound weight gain in 24 hours or 5 pounds in 1 week 2) shortness of breath, with or without a dry hacking cough 3) swelling in the hands, feet or stomach 4) if you have to sleep on extra pillows at night in order to breathe.   Complete by:  As directed    Call MD for:  difficulty breathing, headache or visual disturbances   Complete by:  As directed    Call MD for:  extreme fatigue   Complete by:  As directed    Call MD for:  persistant dizziness or light-headedness   Complete by:  As directed    Call MD for:  persistant nausea and vomiting   Complete by:  As directed    Call MD for:  severe uncontrolled pain   Complete by:  As directed    Call MD for:  temperature >100.4   Complete by:  As directed    Diet - low sodium heart healthy   Complete by:  As directed    Discharge instructions   Complete by:  As directed    You were cared for by a hospitalist during your hospital stay. If you have any questions about your discharge medications or the care you received while you were in the hospital after you are discharged,  you can call the unit and asked to speak with the hospitalist on call if the hospitalist that took care of you is not available. Once you are discharged, your primary care physician will handle any further medical issues. Please note that NO REFILLS for any discharge medications will be authorized once you are discharged, as it is imperative that you return to your primary care physician (or establish a relationship with a primary care physician if you do not have one) for your aftercare needs so that they can reassess your need for medications and monitor your lab values.   Increase activity slowly   Complete by:  As directed      Allergies as of 05/04/2017      Reactions   Codeine Nausea And Vomiting   Latex Rash   Specifies adhesive       Medication List    STOP taking these medications   eplerenone 25 MG tablet Commonly known as:  INSPRA   furosemide 40 MG tablet Commonly known as:  LASIX   lisinopril 5 MG tablet Commonly known as:  PRINIVIL,ZESTRIL     TAKE these medications   allopurinol 100 MG tablet Commonly known as:  ZYLOPRIM Take 100 mg by mouth daily.   aspirin EC 81 MG tablet Take 81 mg  by mouth daily.   atorvastatin 80 MG tablet Commonly known as:  LIPITOR TAKE 1 TABLET BY MOUTH DAILY AT 6:00 PM   carvedilol 25 MG tablet Commonly known as:  COREG Take 1 tablet (25 mg total) by mouth 2 (two) times daily with a meal.   clopidogrel 75 MG tablet Commonly known as:  PLAVIX Take 1 tablet (75 mg total) by mouth daily.   Insulin Lispro Prot & Lispro (50-50) 100 UNIT/ML Kwikpen Commonly known as:  HUMALOG MIX 50/50 KWIKPEN 15 units at breakfast and lunch, 30 units before supper.  Take insulin 10 minutes before eating   Insulin Pen Needle 32G X 4 MM Misc Commonly known as:  RELION PEN NEEDLES Use to inject insulin 3 times per day.   insulin regular human CONCENTRATED 500 UNIT/ML kwikpen Commonly known as:  HUMULIN R U-500 KWIKPEN 30 units before BFST, 20 at  lunch and 35 before supper, take 30-60 minutes before each meal   nitroGLYCERIN 0.4 MG SL tablet Commonly known as:  NITROSTAT Place 1 tablet (0.4 mg total) under the tongue every 5 (five) minutes x 3 doses as needed. For chest pain.   ONE TOUCH ULTRA TEST test strip Generic drug:  glucose blood Use to check blood sugar 3 times per day.   ONETOUCH DELICA LANCETS 71I Misc Use to check blood sugar 3 times per day dx code E11.9   pantoprazole 40 MG tablet Commonly known as:  PROTONIX TAKE ONE TABLET BY MOUTH ONCE DAILY   RANEXA 1000 MG SR tablet Generic drug:  ranolazine Take 500 mg by mouth 2 (two) times daily.   venlafaxine XR 150 MG 24 hr capsule Commonly known as:  EFFEXOR-XR Take 150 mg by mouth at bedtime.   zolpidem 12.5 MG CR tablet Commonly known as:  AMBIEN CR Take 12.5 mg by mouth at bedtime. For sleep            Durable Medical Equipment  (From admission, onward)        Start     Ordered   05/03/17 1225  For home use only DME Walker rolling  Once    Question:  Patient needs a walker to treat with the following condition  Answer:  Weakness   05/03/17 1225     Follow-up Information    Mayra Neer, MD. Schedule an appointment as soon as possible for a visit in 1 week(s).   Specialty:  Family Medicine Contact information: [address] [place] [address] [phone]        Larey Dresser, MD. Schedule an appointment as soon as possible for a visit in 1 week(s).   Specialty:  Cardiology Contact information: [address] [place] [address] [phone]          Allergies  Allergen Reactions  . Codeine Nausea And Vomiting  . Latex Rash    Specifies adhesive     Consultations:  None   Procedures/Studies: Ct Abdomen Pelvis Wo Contrast  Result Date: 05/01/2017 CLINICAL DATA:  Nausea and vomiting with hypotension and weakness. Altered mental status. EXAM: CT ABDOMEN AND PELVIS WITHOUT  CONTRAST TECHNIQUE: Multidetector CT imaging of the abdomen and pelvis was performed following the standard protocol without IV contrast. COMPARISON:  12/24/2011 FINDINGS: Lower chest: Lung bases are within normal. There is moderate cardiomegaly. Cardiac pacer leads are present. Previous median sternotomy. Hepatobiliary: Previous cholecystectomy. Liver and biliary tree are unremarkable. Pancreas: Within normal. Spleen: Within normal. Adrenals/Urinary Tract: Adrenal glands  are normal. Kidneys normal size with 1 cm hypodensity over the upper pole cortex of the left kidney compatible with a cyst. No nephrolithiasis. Left ureter is normal. Mild right-sided hydronephrosis with mild dilatation of the proximal to mid right ureter as the distal right ureter is normal. Mild to moderate bladder distention. Stomach/Bowel: Stomach and small bowel are within normal. Appendix is normal. There is mild diverticulosis of the colon. Vascular/Lymphatic: Mild calcified plaque over the abdominal aorta and iliac arteries. No adenopathy. Reproductive: Within normal. Other: No free fluid or focal inflammatory change. Metallic clip over the right inguinal region. Musculoskeletal: Degenerate change of the spine and hips. IMPRESSION: Mild to moderate bladder distention as cannot exclude a degree of bladder outlet obstruction. There is associated mild right-sided hydronephrosis with dilatation of the proximal to mid ureter likely related to the distended bladder. No definite evidence of ureteral obstruction or stone. 1 cm left renal cyst. Mild diverticulosis of the colon. Aortic Atherosclerosis (ICD10-I70.0). Cardiomegaly. Electronically Signed   By: Marin Olp M.D.   On: 05/01/2017 16:58   Ct Head Wo Contrast  Result Date: 05/01/2017 CLINICAL DATA:  Altered mental status. EXAM: CT HEAD WITHOUT CONTRAST TECHNIQUE: Contiguous axial images were obtained from the base of the skull through the vertex without intravenous contrast.  COMPARISON:  06/18/2015 FINDINGS: Brain: Ventricles, cisterns and other CSF spaces are within normal. There is no mass, mass effect, shift of midline structures or acute hemorrhage. There is no evidence of acute infarction. Vascular: No hyperdense vessel or unexpected calcification. Skull: Normal. Negative for fracture or focal lesion. Sinuses/Orbits: Orbits are within normal. Paranasal sinuses are well developed and well aerated. Subtle mucosal membrane thickening involving the left sphenoid sinus. Other: None. IMPRESSION: Normal head CT. Subtle chronic inflammatory change of the left maxillary and sphenoid sinuses. Electronically Signed   By: Marin Olp M.D.   On: 05/01/2017 14:20   Dg Chest Port 1 View  Result Date: 05/01/2017 CLINICAL DATA:  Hypertension, weakness, mental status change, headache. History of coronary artery disease and diabetes EXAM: PORTABLE CHEST 1 VIEW COMPARISON:  Portable chest x-ray of August 01, 2015 FINDINGS: The lungs are reasonably well inflated. There is no focal infiltrate. The cardiac silhouette is enlarged. The pulmonary vascularity is mildly prominent. There is no significant pulmonary edema. The ICD is in stable position. The sternal wires are intact. There calcification in the wall of the aortic arch. IMPRESSION: Low-grade compensated CHF.  No acute pulmonary edema or pneumonia. Thoracic aortic atherosclerosis.  Previous CABG. Electronically Signed   By: David  [place] M.D.   On: 05/01/2017 13:58       Discharge Exam: Vitals:   05/04/17 0547 05/04/17 1003  BP: 109/65 (!) 118/58  Pulse: 63 62  Resp: 18 18  Temp: 98 F (36.7 C) 97.6 F (36.4 C)  SpO2: 97% 99%   Vitals:   05/03/17 1747 05/03/17 2019 05/04/17 0547 05/04/17 1003  BP: 136/65 (!) 105/47 109/65 (!) 118/58  Pulse: 71 68 63 62  Resp: 18 17 18 18   Temp: 98 F (36.7 C) 97.8 F (36.6 C) 98 F (36.7 C) 97.6 F (36.4 C)  TempSrc: Oral Oral Oral Oral  SpO2: 98% 97% 97% 99%  Weight:  88.3 kg (194  lb 10.7 oz)      General: Pt is alert, awake, not in acute distress Cardiovascular: RRR, S1/S2 +, no rubs, no gallops Respiratory: CTA bilaterally, no wheezing, no rhonchi Abdominal: Soft, NT, ND, bowel sounds + Extremities: no edema, no cyanosis  The results of significant diagnostics from this hospitalization (including imaging, microbiology, ancillary and laboratory) are listed below for reference.     Microbiology: Recent Results (from the past 240 hour(s))  Blood Culture (routine x 2)     Status: None (Preliminary result)   Collection Time: 05/01/17  1:33 PM  Result Value Ref Range Status   Specimen Description BLOOD RIGHT ANTECUBITAL  Final   Special Requests   Final    BOTTLES DRAWN AEROBIC AND ANAEROBIC Blood Culture results may not be optimal due to an excessive volume of blood received in culture bottles   Culture NO GROWTH 3 DAYS  Final   Report Status PENDING  Incomplete  Blood Culture (routine x 2)     Status: None (Preliminary result)   Collection Time: 05/01/17  1:40 PM  Result Value Ref Range Status   Specimen Description BLOOD RIGHT HAND  Final   Special Requests   Final    BOTTLES DRAWN AEROBIC AND ANAEROBIC Blood Culture adequate volume   Culture NO GROWTH 3 DAYS  Final   Report Status PENDING  Incomplete  Urine culture     Status: Abnormal   Collection Time: 05/01/17  3:05 PM  Result Value Ref Range Status   Specimen Description URINE, RANDOM  Final   Special Requests NONE  Final   Culture MULTIPLE SPECIES PRESENT, SUGGEST RECOLLECTION (A)  Final   Report Status 05/02/2017 FINAL  Final     Labs: BNP (last 3 results) Recent Labs    08/29/16 0914 01/16/17 0905 05/01/17 1318  BNP 391.5* 381.3* 536.6*   Basic Metabolic Panel: Recent Labs  Lab 05/01/17 1107 05/01/17 1318 05/01/17 1355 05/02/17 1211 05/03/17 0749 05/04/17 0858  NA 130* 129* 132* 131* 136 131*  K 5.2* 5.2* 5.1 4.7 4.1 4.5  CL 96* 94* 97* 99* 107 103  CO2 21* 20*  --  22 19*  19*  GLUCOSE 290* 309* 296* 138* 90 283*  BUN 92* 91* 79* 90* 66* 42*  CREATININE 3.20* 3.11* 3.00* 2.87* 1.99* 1.65*  CALCIUM 9.2 9.7  --  8.3* 8.7* 8.4*   Liver Function Tests: Recent Labs  Lab 05/01/17 1318  AST 22  ALT 25  ALKPHOS 102  BILITOT 0.8  PROT 8.0  ALBUMIN 4.3   Recent Labs  Lab 05/01/17 1318  LIPASE 45   Recent Labs  Lab 05/01/17 1318  AMMONIA 19   CBC: Recent Labs  Lab 05/01/17 1318 05/01/17 1355 05/02/17 1211 05/03/17 0749 05/04/17 0858  WBC 13.6*  --  8.7 11.0* 10.1  NEUTROABS 10.7*  --   --   --   --   HGB 13.9 15.3 11.0* 12.1* 10.8*  HCT 41.8 45.0 32.6* 37.3* 33.0*  MCV 89.5  --  90.1 91.4 90.9  PLT 303  --  230 226 184   Cardiac Enzymes: No results for input(s): CKTOTAL, CKMB, CKMBINDEX, TROPONINI in the last 168 hours. BNP: Invalid input(s): POCBNP CBG: Recent Labs  Lab 05/03/17 1623 05/03/17 1641 05/03/17 1743 05/03/17 2152 05/04/17 0818  GLUCAP 52* 56* 184* 154* 273*   D-Dimer No results for input(s): DDIMER in the last 72 hours. Hgb A1c No results for input(s): HGBA1C in the last 72 hours. Lipid Profile No results for input(s): CHOL, HDL, LDLCALC, TRIG, CHOLHDL, LDLDIRECT in the last 72 hours. Thyroid function studies No results for input(s): TSH, T4TOTAL, T3FREE, THYROIDAB in the last 72 hours.  Invalid input(s): FREET3 Anemia work up No results for input(s): VITAMINB12, FOLATE, FERRITIN,  TIBC, IRON, RETICCTPCT in the last 72 hours. Urinalysis    Component Value Date/Time   COLORURINE STRAW (A) 05/01/2017 1505   APPEARANCEUR CLEAR 05/01/2017 1505   LABSPEC 1.009 05/01/2017 1505   PHURINE 5.0 05/01/2017 1505   GLUCOSEU 50 (A) 05/01/2017 1505   HGBUR NEGATIVE 05/01/2017 1505   BILIRUBINUR NEGATIVE 05/01/2017 1505   KETONESUR NEGATIVE 05/01/2017 1505   PROTEINUR NEGATIVE 05/01/2017 1505   UROBILINOGEN 2.0 (H) 12/23/2011 2145   NITRITE NEGATIVE 05/01/2017 1505   LEUKOCYTESUR NEGATIVE 05/01/2017 1505   Sepsis  Labs Invalid input(s): PROCALCITONIN,  WBC,  LACTICIDVEN Microbiology Recent Results (from the past 240 hour(s))  Blood Culture (routine x 2)     Status: None (Preliminary result)   Collection Time: 05/01/17  1:33 PM  Result Value Ref Range Status   Specimen Description BLOOD RIGHT ANTECUBITAL  Final   Special Requests   Final    BOTTLES DRAWN AEROBIC AND ANAEROBIC Blood Culture results may not be optimal due to an excessive volume of blood received in culture bottles   Culture NO GROWTH 3 DAYS  Final   Report Status PENDING  Incomplete  Blood Culture (routine x 2)     Status: None (Preliminary result)   Collection Time: 05/01/17  1:40 PM  Result Value Ref Range Status   Specimen Description BLOOD RIGHT HAND  Final   Special Requests   Final    BOTTLES DRAWN AEROBIC AND ANAEROBIC Blood Culture adequate volume   Culture NO GROWTH 3 DAYS  Final   Report Status PENDING  Incomplete  Urine culture     Status: Abnormal   Collection Time: 05/01/17  3:05 PM  Result Value Ref Range Status   Specimen Description URINE, RANDOM  Final   Special Requests NONE  Final   Culture MULTIPLE SPECIES PRESENT, SUGGEST RECOLLECTION (A)  Final   Report Status 05/02/2017 FINAL  Final     Time coordinating discharge: 30 minutes  SIGNED:  Dessa Phi, DO [facility] Pager [phone]  If 7PM-7AM, please contact night-coverage [email or web address] Password [email or web address] 05/04/2017, 11:29 AM

## 2017-05-04 NOTE — Discharge Instructions (Signed)

## 2017-05-04 NOTE — Care Management Important Message (Signed)
Important Message  Patient Details  Name: ARVILLE POSTLEWAITE MRN: [id number] Date of Birth: 07/13/1940   Medicare Important Message Given:  Yes    Erenest Rasher, RN 05/04/2017, 1:37 PM

## 2017-05-06 LAB — CULTURE, BLOOD (ROUTINE X 2)
Culture: NO GROWTH
Culture: NO GROWTH
Special Requests: ADEQUATE

## 2017-05-07 DIAGNOSIS — I130 Hypertensive heart and chronic kidney disease with heart failure and stage 1 through stage 4 chronic kidney disease, or unspecified chronic kidney disease: Secondary | ICD-10-CM | POA: Diagnosis not present

## 2017-05-07 DIAGNOSIS — N179 Acute kidney failure, unspecified: Secondary | ICD-10-CM | POA: Diagnosis not present

## 2017-05-08 DIAGNOSIS — E1165 Type 2 diabetes mellitus with hyperglycemia: Secondary | ICD-10-CM | POA: Diagnosis not present

## 2017-05-08 DIAGNOSIS — E785 Hyperlipidemia, unspecified: Secondary | ICD-10-CM | POA: Diagnosis not present

## 2017-05-08 DIAGNOSIS — I130 Hypertensive heart and chronic kidney disease with heart failure and stage 1 through stage 4 chronic kidney disease, or unspecified chronic kidney disease: Secondary | ICD-10-CM | POA: Diagnosis not present

## 2017-05-08 DIAGNOSIS — R531 Weakness: Secondary | ICD-10-CM | POA: Diagnosis not present

## 2017-05-08 DIAGNOSIS — E1122 Type 2 diabetes mellitus with diabetic chronic kidney disease: Secondary | ICD-10-CM | POA: Diagnosis not present

## 2017-05-08 DIAGNOSIS — I255 Ischemic cardiomyopathy: Secondary | ICD-10-CM | POA: Diagnosis not present

## 2017-05-08 DIAGNOSIS — N183 Chronic kidney disease, stage 3 (moderate): Secondary | ICD-10-CM | POA: Diagnosis not present

## 2017-05-08 DIAGNOSIS — I2510 Atherosclerotic heart disease of native coronary artery without angina pectoris: Secondary | ICD-10-CM | POA: Diagnosis not present

## 2017-05-08 DIAGNOSIS — Z7982 Long term (current) use of aspirin: Secondary | ICD-10-CM | POA: Diagnosis not present

## 2017-05-08 DIAGNOSIS — I5042 Chronic combined systolic (congestive) and diastolic (congestive) heart failure: Secondary | ICD-10-CM | POA: Diagnosis not present

## 2017-05-11 ENCOUNTER — Encounter (HOSPITAL_COMMUNITY): Payer: Self-pay | Admitting: Cardiology

## 2017-05-11 ENCOUNTER — Ambulatory Visit (HOSPITAL_COMMUNITY)
Admission: RE | Admit: 2017-05-11 | Discharge: 2017-05-11 | Disposition: A | Discharge status: Home / Self Care | Payer: Medicare HMO | Source: Ambulatory Visit | Attending: Cardiology | Admitting: Cardiology

## 2017-05-11 ENCOUNTER — Ambulatory Visit (HOSPITAL_COMMUNITY)
Admit: 2017-05-11 | Discharge: 2017-05-11 | Disposition: A | Discharge status: Home / Self Care | Payer: Medicare HMO | Attending: Cardiology | Admitting: Cardiology

## 2017-05-11 VITALS — BP 119/62 | HR 81 | Temp 99.0°F | Wt 200.8 lb

## 2017-05-11 DIAGNOSIS — R0689 Other abnormalities of breathing: Secondary | ICD-10-CM

## 2017-05-11 DIAGNOSIS — K219 Gastro-esophageal reflux disease without esophagitis: Secondary | ICD-10-CM | POA: Insufficient documentation

## 2017-05-11 DIAGNOSIS — M109 Gout, unspecified: Secondary | ICD-10-CM | POA: Diagnosis not present

## 2017-05-11 DIAGNOSIS — E785 Hyperlipidemia, unspecified: Secondary | ICD-10-CM | POA: Diagnosis not present

## 2017-05-11 DIAGNOSIS — R0602 Shortness of breath: Secondary | ICD-10-CM | POA: Diagnosis not present

## 2017-05-11 DIAGNOSIS — Z9049 Acquired absence of other specified parts of digestive tract: Secondary | ICD-10-CM | POA: Diagnosis not present

## 2017-05-11 DIAGNOSIS — J189 Pneumonia, unspecified organism: Secondary | ICD-10-CM

## 2017-05-11 DIAGNOSIS — N179 Acute kidney failure, unspecified: Secondary | ICD-10-CM | POA: Diagnosis not present

## 2017-05-11 DIAGNOSIS — I2510 Atherosclerotic heart disease of native coronary artery without angina pectoris: Secondary | ICD-10-CM

## 2017-05-11 DIAGNOSIS — Z794 Long term (current) use of insulin: Secondary | ICD-10-CM | POA: Diagnosis not present

## 2017-05-11 DIAGNOSIS — I2582 Chronic total occlusion of coronary artery: Secondary | ICD-10-CM | POA: Insufficient documentation

## 2017-05-11 DIAGNOSIS — I130 Hypertensive heart and chronic kidney disease with heart failure and stage 1 through stage 4 chronic kidney disease, or unspecified chronic kidney disease: Secondary | ICD-10-CM | POA: Diagnosis not present

## 2017-05-11 DIAGNOSIS — Z951 Presence of aortocoronary bypass graft: Secondary | ICD-10-CM | POA: Insufficient documentation

## 2017-05-11 DIAGNOSIS — E1122 Type 2 diabetes mellitus with diabetic chronic kidney disease: Secondary | ICD-10-CM | POA: Diagnosis not present

## 2017-05-11 DIAGNOSIS — R05 Cough: Secondary | ICD-10-CM | POA: Diagnosis not present

## 2017-05-11 DIAGNOSIS — F329 Major depressive disorder, single episode, unspecified: Secondary | ICD-10-CM | POA: Diagnosis not present

## 2017-05-11 DIAGNOSIS — I255 Ischemic cardiomyopathy: Secondary | ICD-10-CM | POA: Diagnosis not present

## 2017-05-11 DIAGNOSIS — Z79899 Other long term (current) drug therapy: Secondary | ICD-10-CM | POA: Insufficient documentation

## 2017-05-11 DIAGNOSIS — I252 Old myocardial infarction: Secondary | ICD-10-CM | POA: Diagnosis not present

## 2017-05-11 DIAGNOSIS — Z8249 Family history of ischemic heart disease and other diseases of the circulatory system: Secondary | ICD-10-CM | POA: Diagnosis not present

## 2017-05-11 DIAGNOSIS — Z87891 Personal history of nicotine dependence: Secondary | ICD-10-CM | POA: Diagnosis not present

## 2017-05-11 DIAGNOSIS — Z7902 Long term (current) use of antithrombotics/antiplatelets: Secondary | ICD-10-CM | POA: Insufficient documentation

## 2017-05-11 DIAGNOSIS — N183 Chronic kidney disease, stage 3 (moderate): Secondary | ICD-10-CM | POA: Diagnosis not present

## 2017-05-11 DIAGNOSIS — Z7982 Long term (current) use of aspirin: Secondary | ICD-10-CM | POA: Insufficient documentation

## 2017-05-11 DIAGNOSIS — I5022 Chronic systolic (congestive) heart failure: Secondary | ICD-10-CM | POA: Diagnosis not present

## 2017-05-11 DIAGNOSIS — Z955 Presence of coronary angioplasty implant and graft: Secondary | ICD-10-CM | POA: Insufficient documentation

## 2017-05-11 LAB — BASIC METABOLIC PANEL
Anion gap: 10 (ref 5–15)
BUN: 23 mg/dL — AB (ref 6–20)
CHLORIDE: 103 mmol/L (ref 101–111)
CO2: 21 mmol/L — AB (ref 22–32)
Calcium: 8.9 mg/dL (ref 8.9–10.3)
Creatinine, Ser: 1.49 mg/dL — ABNORMAL HIGH (ref 0.61–1.24)
GFR calc Af Amer: 52 mL/min — ABNORMAL LOW (ref >60)
GFR calc non Af Amer: 44 mL/min — ABNORMAL LOW (ref >60)
GLUCOSE: 158 mg/dL — AB (ref 65–99)
POTASSIUM: 4.7 mmol/L (ref 3.5–5.1)
SODIUM: 134 mmol/L — AB (ref 135–145)

## 2017-05-11 LAB — CBC
HEMATOCRIT: 34.4 % — AB (ref 39.0–52.0)
Hemoglobin: 11 g/dL — ABNORMAL LOW (ref 13.0–17.0)
MCH: 30 pg (ref 26.0–34.0)
MCHC: 32 g/dL (ref 30.0–36.0)
MCV: 93.7 fL (ref 78.0–100.0)
Platelets: 231 10*3/uL (ref 150–400)
RBC: 3.67 MIL/uL — ABNORMAL LOW (ref 4.22–5.81)
RDW: 16.3 % — ABNORMAL HIGH (ref 11.5–15.5)
WBC: 7.1 10*3/uL (ref 4.0–10.5)

## 2017-05-11 MED ORDER — CARVEDILOL 25 MG PO TABS: 12.5000 mg | ORAL_TABLET | Freq: Two times a day (BID) | ORAL | 3 refills | Status: DC

## 2017-05-11 MED ORDER — EPLERENONE 25 MG PO TABS: 25.0000 mg | ORAL_TABLET | Freq: Every day | ORAL | 3 refills | Status: DC

## 2017-05-11 MED ORDER — ATORVASTATIN CALCIUM 80 MG PO TABS: ORAL_TABLET | ORAL | 6 refills | Status: DC

## 2017-05-11 MED ORDER — FUROSEMIDE 40 MG PO TABS: 40.0000 mg | ORAL_TABLET | Freq: Two times a day (BID) | ORAL | 3 refills | Status: DC

## 2017-05-11 NOTE — Patient Instructions (Signed)
Stop Lisinopril  Start Eplaronone 25 mg (1 tab) daily  Start Furosemide 40 mg (1 tab), twice a day  A chest x-ray takes a picture of the organs and structures inside the chest, including the heart, lungs, and blood vessels. This test can show several things, including, whether the heart is enlarges; whether fluid is building up in the lungs; and whether pacemaker / defibrillator leads are still in place. (today)  Labs drawn today (if we do not call you, then your lab work was stable)   Your physician recommends that you return for lab work in: 7-10 days   Your physician recommends that you schedule a follow-up appointment in: 7-10 day with APP

## 2017-05-12 NOTE — Progress Notes (Signed)
Advanced Heart Failure Clinic Note   Patient ID: FRIEDRICH HARRIOTT, male   DOB: 02/27/1941, 77 y.o.   MRN: [id number] PCP: Dr. Lynnda Child Cardiology: Dr Aundra Dubin  85 yo with history of CAD s/p CABG and redo CABG as well as ischemic cardiomyopathy with CRT-D device presents for followup of CHF and CAD.  He had a Cardiolite in the 4/15 showing lateral wall ischemia.  LHC at that time showed all his vein grafts from both CABG surgeries occluded.  The LIMA-LAD was patent and there was a 90% distal LM stenosis.  The lateral ischemia likely corresponded to LCx territory downstream from the LM stenosis.  PCI of the distal LM was thought to be high risk and characteristics of the lesion were not favorable for PCI.  Last echo showed EF 25-30% with moderate RV systolic dysfunction.  He had RHC in 11/15 with normal filling pressures and relatively preserved cardiac index (2.55).    In 4/17, he was admitted with chest pain concerning for unstable angina.  Angiography showed 85% distal LM with 90% ostial LCx stenosis.  LIMA was patent but proximal LAD, proximal RCA, and all SVGs were totally occluded. High had successful DES to LM and PTCA to ostial LCx with Impella support.  [name] was stopped and he was put back on low dose lisinopril due to symptomatic hypotension.  He had Cardiolite in 11/17 with scar, no ischemia.   He returns for followup of CHF and CAD.  At last appointment, I increased his Lasix due to volume overload.  However, he was then admitted in 1/19 with AKI, creatinine up to 3.  Eplerenone, Lasix, and lisinopril were stopped.  Last creatinine was 1.48.  For the last week, he has been congested and coughing with subjective fever.  Taking Robitussin.  He is short of breath after walking 20 feet.  No orthopnea, PND, or chest pain. He has not taken Lasix since discharge from hospital.  Feeling weak.   Medtronic device interrogation: Fluid index nearing threshold, impedance falling.  No atrial fibrillation, >  99% BiV pacing  Labs (9/15): LDL particle number 816, LDL 57, LFTs normal Labs (10/15): K 4.4, creatinine 1.4 Labs (11/15): K 4.3, creatinine 1.36 Labs (12/15): K 4.8, creatinine 1.34 Labs (3/16): K 4, creatinine 1.38, LDL 46, HDl 35 Labs (7/16): K 4, creatinine 1.39, BNP 211 Labs (03/03/15): K 4.1, creatinine 1.47, BNP 227, LDL 80, HDL 32 Labs (12/16): K 4.6, creatinine 1.12, LDL 68, HDL 34 Labs (4/17): K 3.4, creatinine 1.15 Labs (6/17): K 3.8, creatinine 1.67 Labs (7/17): K 4, creatinine 1.35 Labs (10/17): K 3.9, creatinine 1.31, LDL 45, HDL 30 Labs (2/18): K 4, creatinine 1.43 Labs (5/18): LDL 44, HDL 27 Labs (6/18): K 4.1, creatinine 1.49 Labs (12/18): K 4.4, creatinine 1.44 Labs (1/19): K 4.3, creatinine 1.48  PMH: 1. Gout 2. Hyperlipidemia 3. CAD: CABG 1997 and redo 11/12.   - LHC (4/15) with totally occluded LAD, totally occluded RCA, 80% distal LM, 50% mLCx, 2 SVG-RCA grafts totally occluded, 2 SVG-OM grafts totally occluded, patent LIMA-LAD.  Cardiolite prior to 4/15 cath showed lateral wall ischemia (LCx territory).  PCI to distal LM would be a high risk procedure.   - Cardiolite (8/16) with EF 20%, severe scar in RCA and probably LCx territory, minimal peri-infarct ischemia.   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  - Unstable angina 4/17: 85% distal LM with 90% ostial LCx stenosis.  LIMA was patent but proximal LAD,  proximal RCA, and all SVGs were totally occluded. He had successful DES to LM and PTCA to ostial LCx with Impella support  - Cardiolite (11/17): EF 20%, prior MI, no ischemia.  4. Ischemic Cardiomyopathy: Medtronic CRT-D device.   - Echo (6/15) with EF 25-30%, moderate LV dilation, inferior and inferolateral akinesis, moderately decreased RV systolic function, mild MR.   - RHC (11/15) with mean RA 5, PA 23/6, mean PCWP 9, CI 2.55.  - Echo (4/17): EF 25-30% with mild MR.  - Echo (11/17): EF 25-30%, moderately dilated LV,  grade II diastolic dysfunction, mild-moderate MR, mildly decreased RV systolic function.  5. H/o cholecystectomy 6. OA 7. Depression 8. Type II diabetes 9. GERD 10. CKD  SH: Married, prior smoker (many years ago), lives in [place].    FH: CAD  ROS: All systems reviewed and negative except as per HPI.   Current Outpatient Medications  Medication Sig Dispense Refill  . allopurinol (ZYLOPRIM) 100 MG tablet Take 100 mg by mouth daily.     Marland Kitchen aspirin EC 81 MG tablet Take 81 mg by mouth daily.    Marland Kitchen atorvastatin (LIPITOR) 80 MG tablet TAKE 1 TABLET BY MOUTH DAILY AT 6:00 PM 90 tablet 6  . carvedilol (COREG) 25 MG tablet Take 0.5 tablets (12.5 mg total) by mouth 2 (two) times daily with a meal. 30 tablet 3  . clopidogrel (PLAVIX) 75 MG tablet Take 1 tablet (75 mg total) by mouth daily. 90 tablet 3  . Insulin Lispro Prot & Lispro (HUMALOG MIX 50/50 KWIKPEN) (50-50) 100 UNIT/ML Kwikpen 15 units at breakfast and lunch, 30 units before supper.  Take insulin 10 minutes before eating 15 mL 0  . Insulin Pen Needle (RELION PEN NEEDLES) 32G X 4 MM MISC Use to inject insulin 3 times per day. 150 each 2  . insulin regular human CONCENTRATED (HUMULIN R U-500 KWIKPEN) 500 UNIT/ML kwikpen 30 units before BFST, 20 at lunch and 35 before supper, take 30-60 minutes before each meal 2 pen 3  . nitroGLYCERIN (NITROSTAT) 0.4 MG SL tablet Place 1 tablet (0.4 mg total) under the tongue every 5 (five) minutes x 3 doses as needed. For chest pain. 25 tablet 3  . ONE TOUCH ULTRA TEST test strip Use to check blood sugar 3 times per day. 150 each 3  . ONETOUCH DELICA LANCETS 40N MISC Use to check blood sugar 3 times per day dx code E11.9 100 each 3  . pantoprazole (PROTONIX) 40 MG tablet TAKE ONE TABLET BY MOUTH ONCE DAILY 30 tablet 11  . ranolazine (RANEXA) 1000 MG SR tablet Take 500 mg by mouth 2 (two) times daily.    Marland Kitchen venlafaxine (EFFEXOR-XR) 150 MG 24 hr capsule Take 150 mg by mouth at bedtime.     Marland Kitchen zolpidem  (AMBIEN CR) 12.5 MG CR tablet Take 12.5 mg by mouth at bedtime. For sleep    . eplerenone (INSPRA) 25 MG tablet Take 1 tablet (25 mg total) by mouth daily. 30 tablet 3  . furosemide (LASIX) 40 MG tablet Take 1 tablet (40 mg total) by mouth 2 (two) times daily. 60 tablet 3   No current facility-administered medications for this encounter.    BP 119/62 (BP Location: Left Arm, Patient Position: Sitting, Cuff Size: Normal)   Pulse 81   Temp 99 F (37.2 C) (Oral)   Wt 200 lb 12.8 oz (91.1 kg)   SpO2 97%   BMI 28.81 kg/m    Wt Readings from Last 3 Encounters:  05/11/17 200 lb 12.8 oz (91.1 kg)  05/03/17 194 lb 10.7 oz (88.3 kg)  04/18/17 200 lb 6.4 oz (90.9 kg)    General: NAD Neck: JVP 8 cm + HJR, no thyromegaly or thyroid nodule.  Lungs: Crackles at bases bilaterally.  CV: Nondisplaced PMI.  Heart regular S1/S2, no S3/S4, no murmur.  No peripheral edema.  No carotid bruit.  Normal pedal pulses.  Abdomen: Soft, nontender, no hepatosplenomegaly, no distention.  Skin: Intact without lesions or rashes.  Neurologic: Alert and oriented x 3.  Psych: Normal affect. Extremities: No clubbing or cyanosis.  HEENT: Normal.   Assessment/Plan: 1. Chronic systolic CHF: Ischemic cardiomyopathy.  NYHA class II symptoms, stable.  EF 25-30% in 11/17 (stable). He has a Medtronic CRT-D device, >99% BiV pacing today.  Recently stopped Lasix, lisinopril, and eplerenone due to AKI.  Creatinine came back down to 1.48 when last checked.  NYHA class III symptoms currently, volume overloaded by exam and Optivol, some of his dyspnea/fatigue is likely due to URI/acute bronchitis.  Optivol suggests volume overload over the last month with decreased impedance.   - Restart eplerenone 25 mg daily.  - Restart Lasix at 40 mg bid.  - BMET today and again in 1 week.  - BP did not tolerate Entresto.  Off lisinopril with recent AKI, will likely try to restart low dose at next appt.  - Continue Coreg 12.5 mg bid. - Will  need repeat echo.  2. CAD: s/p CABG and redo. Had DES to distal LM, angioplasty to ostial LCx in 4/17.  Cardiolite 11/17 with scar, no ischemia.  No recent chest pain.  - Continue ASA 81, Plavix, statin.  - Continue Coreg and ranolazine 500 mg bid.  3. CKD stage III: Recent AKI with improvement of creatinine. Restarting Lasix and eplerenone today, will try to restart lisinopril in the future. BMET today.  4. Hyperlipidemia: Good lipids 5/18.    5. Acute bronchitis: Suspected bronchitis with upper respiratory congestion.   - CXR today to rule out PNA.   Followup in 1 week with NP/PA.   [name] 05/12/2017

## 2017-05-17 DIAGNOSIS — J069 Acute upper respiratory infection, unspecified: Secondary | ICD-10-CM | POA: Diagnosis not present

## 2017-05-17 DIAGNOSIS — N183 Chronic kidney disease, stage 3 (moderate): Secondary | ICD-10-CM | POA: Diagnosis not present

## 2017-05-17 DIAGNOSIS — I5022 Chronic systolic (congestive) heart failure: Secondary | ICD-10-CM | POA: Diagnosis not present

## 2017-05-17 DIAGNOSIS — I255 Ischemic cardiomyopathy: Secondary | ICD-10-CM | POA: Diagnosis not present

## 2017-05-17 DIAGNOSIS — N179 Acute kidney failure, unspecified: Secondary | ICD-10-CM | POA: Diagnosis not present

## 2017-05-17 DIAGNOSIS — E1121 Type 2 diabetes mellitus with diabetic nephropathy: Secondary | ICD-10-CM | POA: Diagnosis not present

## 2017-05-17 DIAGNOSIS — I130 Hypertensive heart and chronic kidney disease with heart failure and stage 1 through stage 4 chronic kidney disease, or unspecified chronic kidney disease: Secondary | ICD-10-CM | POA: Diagnosis not present

## 2017-05-17 DIAGNOSIS — R410 Disorientation, unspecified: Secondary | ICD-10-CM | POA: Diagnosis not present

## 2017-05-17 DIAGNOSIS — G4700 Insomnia, unspecified: Secondary | ICD-10-CM | POA: Diagnosis not present

## 2017-05-17 DIAGNOSIS — I25119 Atherosclerotic heart disease of native coronary artery with unspecified angina pectoris: Secondary | ICD-10-CM | POA: Diagnosis not present

## 2017-05-18 DIAGNOSIS — Z7982 Long term (current) use of aspirin: Secondary | ICD-10-CM | POA: Diagnosis not present

## 2017-05-18 DIAGNOSIS — E1165 Type 2 diabetes mellitus with hyperglycemia: Secondary | ICD-10-CM | POA: Diagnosis not present

## 2017-05-18 DIAGNOSIS — R531 Weakness: Secondary | ICD-10-CM | POA: Diagnosis not present

## 2017-05-18 DIAGNOSIS — N183 Chronic kidney disease, stage 3 (moderate): Secondary | ICD-10-CM | POA: Diagnosis not present

## 2017-05-18 DIAGNOSIS — E785 Hyperlipidemia, unspecified: Secondary | ICD-10-CM | POA: Diagnosis not present

## 2017-05-18 DIAGNOSIS — I5042 Chronic combined systolic (congestive) and diastolic (congestive) heart failure: Secondary | ICD-10-CM | POA: Diagnosis not present

## 2017-05-18 DIAGNOSIS — I255 Ischemic cardiomyopathy: Secondary | ICD-10-CM | POA: Diagnosis not present

## 2017-05-18 DIAGNOSIS — I2510 Atherosclerotic heart disease of native coronary artery without angina pectoris: Secondary | ICD-10-CM | POA: Diagnosis not present

## 2017-05-18 DIAGNOSIS — E1122 Type 2 diabetes mellitus with diabetic chronic kidney disease: Secondary | ICD-10-CM | POA: Diagnosis not present

## 2017-05-18 DIAGNOSIS — I130 Hypertensive heart and chronic kidney disease with heart failure and stage 1 through stage 4 chronic kidney disease, or unspecified chronic kidney disease: Secondary | ICD-10-CM | POA: Diagnosis not present

## 2017-05-21 ENCOUNTER — Inpatient Hospital Stay (HOSPITAL_COMMUNITY): Admission: RE | Admit: 2017-05-21 | Payer: Medicare HMO | Source: Ambulatory Visit

## 2017-05-31 ENCOUNTER — Ambulatory Visit (INDEPENDENT_AMBULATORY_CARE_PROVIDER_SITE_OTHER): Payer: Medicare HMO | Admitting: *Deleted

## 2017-05-31 ENCOUNTER — Ambulatory Visit (HOSPITAL_COMMUNITY)
Admission: RE | Admit: 2017-05-31 | Discharge: 2017-05-31 | Disposition: A | Discharge status: Home / Self Care | Payer: Medicare HMO | Source: Ambulatory Visit | Attending: Internal Medicine | Admitting: Internal Medicine

## 2017-05-31 ENCOUNTER — Encounter (HOSPITAL_COMMUNITY): Payer: Self-pay

## 2017-05-31 ENCOUNTER — Encounter: Payer: Self-pay | Admitting: Cardiology

## 2017-05-31 VITALS — BP 120/60 | HR 68 | Wt 191.0 lb

## 2017-05-31 DIAGNOSIS — R002 Palpitations: Secondary | ICD-10-CM | POA: Insufficient documentation

## 2017-05-31 DIAGNOSIS — N183 Chronic kidney disease, stage 3 unspecified: Secondary | ICD-10-CM

## 2017-05-31 DIAGNOSIS — I2510 Atherosclerotic heart disease of native coronary artery without angina pectoris: Secondary | ICD-10-CM | POA: Diagnosis not present

## 2017-05-31 DIAGNOSIS — Z9049 Acquired absence of other specified parts of digestive tract: Secondary | ICD-10-CM | POA: Diagnosis not present

## 2017-05-31 DIAGNOSIS — Z955 Presence of coronary angioplasty implant and graft: Secondary | ICD-10-CM | POA: Diagnosis not present

## 2017-05-31 DIAGNOSIS — I480 Paroxysmal atrial fibrillation: Secondary | ICD-10-CM | POA: Insufficient documentation

## 2017-05-31 DIAGNOSIS — Z951 Presence of aortocoronary bypass graft: Secondary | ICD-10-CM | POA: Insufficient documentation

## 2017-05-31 DIAGNOSIS — Z79899 Other long term (current) drug therapy: Secondary | ICD-10-CM | POA: Diagnosis not present

## 2017-05-31 DIAGNOSIS — K219 Gastro-esophageal reflux disease without esophagitis: Secondary | ICD-10-CM | POA: Insufficient documentation

## 2017-05-31 DIAGNOSIS — Z95810 Presence of automatic (implantable) cardiac defibrillator: Secondary | ICD-10-CM | POA: Diagnosis not present

## 2017-05-31 DIAGNOSIS — F329 Major depressive disorder, single episode, unspecified: Secondary | ICD-10-CM | POA: Insufficient documentation

## 2017-05-31 DIAGNOSIS — M1990 Unspecified osteoarthritis, unspecified site: Secondary | ICD-10-CM | POA: Diagnosis not present

## 2017-05-31 DIAGNOSIS — I5022 Chronic systolic (congestive) heart failure: Secondary | ICD-10-CM

## 2017-05-31 DIAGNOSIS — Z7982 Long term (current) use of aspirin: Secondary | ICD-10-CM | POA: Diagnosis not present

## 2017-05-31 DIAGNOSIS — Z87891 Personal history of nicotine dependence: Secondary | ICD-10-CM | POA: Diagnosis not present

## 2017-05-31 DIAGNOSIS — Z8249 Family history of ischemic heart disease and other diseases of the circulatory system: Secondary | ICD-10-CM | POA: Insufficient documentation

## 2017-05-31 DIAGNOSIS — I255 Ischemic cardiomyopathy: Secondary | ICD-10-CM | POA: Diagnosis not present

## 2017-05-31 DIAGNOSIS — Z794 Long term (current) use of insulin: Secondary | ICD-10-CM | POA: Insufficient documentation

## 2017-05-31 DIAGNOSIS — I252 Old myocardial infarction: Secondary | ICD-10-CM | POA: Insufficient documentation

## 2017-05-31 DIAGNOSIS — E1122 Type 2 diabetes mellitus with diabetic chronic kidney disease: Secondary | ICD-10-CM | POA: Insufficient documentation

## 2017-05-31 DIAGNOSIS — Z7902 Long term (current) use of antithrombotics/antiplatelets: Secondary | ICD-10-CM | POA: Diagnosis not present

## 2017-05-31 DIAGNOSIS — M109 Gout, unspecified: Secondary | ICD-10-CM | POA: Diagnosis not present

## 2017-05-31 DIAGNOSIS — E785 Hyperlipidemia, unspecified: Secondary | ICD-10-CM | POA: Insufficient documentation

## 2017-05-31 LAB — BASIC METABOLIC PANEL
Anion gap: 12 (ref 5–15)
BUN: 33 mg/dL — ABNORMAL HIGH (ref 6–20)
CHLORIDE: 99 mmol/L — AB (ref 101–111)
CO2: 23 mmol/L (ref 22–32)
CREATININE: 1.54 mg/dL — AB (ref 0.61–1.24)
Calcium: 8.9 mg/dL (ref 8.9–10.3)
GFR calc non Af Amer: 43 mL/min — ABNORMAL LOW (ref >60)
GFR, EST AFRICAN AMERICAN: 50 mL/min — AB (ref >60)
Glucose, Bld: 414 mg/dL — ABNORMAL HIGH (ref 65–99)
POTASSIUM: 4.1 mmol/L (ref 3.5–5.1)
SODIUM: 134 mmol/L — AB (ref 135–145)

## 2017-05-31 MED ORDER — LOSARTAN POTASSIUM 25 MG PO TABS: 25.0000 mg | ORAL_TABLET | Freq: Every day | ORAL | 3 refills | Status: DC

## 2017-05-31 NOTE — Progress Notes (Signed)
Remote ICD transmission.   

## 2017-05-31 NOTE — Patient Instructions (Signed)
START Losartan 12.5 mg (1/2 tablet) once daily at bedtime.  Routine lab work today. Will notify you of abnormal results, otherwise no news is good news!  Return in 1-2 weeks for repeat labs.  ________________________________________________________ [name] Code: 9001  Follow up 6 weeks with echocardiogram and appointment with Dr. Aundra Dubin.  ________________________________________________________ [name] Code: 9002  Take all medication as prescribed the day of your appointment. Bring all medications with you to your appointment.  Do the following things EVERYDAY: 1) Weigh yourself in the morning before breakfast. Write it down and keep it in a log. 2) Take your medicines as prescribed 3) Eat low salt foods-Limit salt (sodium) to 2000 mg per day.  4) Stay as active as you can everyday 5) Limit all fluids for the day to less than 2 liters

## 2017-05-31 NOTE — Progress Notes (Signed)
Advanced Heart Failure Clinic Note   Patient ID: [name], male   DOB: 1940-11-22, 77 y.o.   MRN: [id number] PCP: Dr. Lynnda Child Cardiology: Dr Alvia Grove [name] is a 77 y.o. male with history of CAD s/p CABG and redo CABG as well as ischemic cardiomyopathy with CRT-D device presents for followup of CHF and CAD.  He had a Cardiolite in the 4/15 showing lateral wall ischemia.  LHC at that time showed all his vein grafts from both CABG surgeries occluded.  The LIMA-LAD was patent and there was a 90% distal LM stenosis.  The lateral ischemia likely corresponded to LCx territory downstream from the LM stenosis.  PCI of the distal LM was thought to be high risk and characteristics of the lesion were not favorable for PCI.  Last echo showed EF 25-30% with moderate RV systolic dysfunction.  He had RHC in 11/15 with normal filling pressures and relatively preserved cardiac index (2.55).    In 4/17, he was admitted with chest pain concerning for unstable angina.  Angiography showed 85% distal LM with 90% ostial LCx stenosis.  LIMA was patent but proximal LAD, proximal RCA, and all SVGs were totally occluded. High had successful DES to LM and PTCA to ostial LCx with Impella support.  [name] was stopped and he was put back on low dose lisinopril due to symptomatic hypotension.  He had Cardiolite in 11/17 with scar, no ischemia.   He presents today for regular follow up. At last visit restarted on eplerenone and lasix after hospitalization with recent AKI. Feeling better since last visit. He denies SOB doing his ADLs. No orthopnea, PND, or CP. He is taking lasix 40 mg BID. Has intermittent swelling in his legs.  He had mild lightheadedness about once a week, but not marked or limiting. Denies dizziness or lightheadedness today. He states he coughed while he was on lisinopril, and hasn't been coughing since he's been off of it. Has occasional palpitations that last 30-45 seconds, not related to activity.    Medtronic device interrogation: Thoracic impedence above threshold. Pt activity 1-2 hrs daily. No AF, VT/VF.   Labs (9/15): LDL particle number 816, LDL 57, LFTs normal Labs (10/15): K 4.4, creatinine 1.4 Labs (11/15): K 4.3, creatinine 1.36 Labs (12/15): K 4.8, creatinine 1.34 Labs (3/16): K 4, creatinine 1.38, LDL 46, HDl 35 Labs (7/16): K 4, creatinine 1.39, BNP 211 Labs (03/03/15): K 4.1, creatinine 1.47, BNP 227, LDL 80, HDL 32 Labs (12/16): K 4.6, creatinine 1.12, LDL 68, HDL 34 Labs (4/17): K 3.4, creatinine 1.15 Labs (6/17): K 3.8, creatinine 1.67 Labs (7/17): K 4, creatinine 1.35 Labs (10/17): K 3.9, creatinine 1.31, LDL 45, HDL 30 Labs (2/18): K 4, creatinine 1.43 Labs (5/18): LDL 44, HDL 27 Labs (6/18): K 4.1, creatinine 1.49 Labs (12/18): K 4.4, creatinine 1.44 Labs (1/19): K 4.3, creatinine 1.48  PMH: 1. Gout 2. Hyperlipidemia 3. CAD: CABG 1997 and redo 11/12.   - LHC (4/15) with totally occluded LAD, totally occluded RCA, 80% distal LM, 50% mLCx, 2 SVG-RCA grafts totally occluded, 2 SVG-OM grafts totally occluded, patent LIMA-LAD.  Cardiolite prior to 4/15 cath showed lateral wall ischemia (LCx territory).  PCI to distal LM would be a high risk procedure.   - Cardiolite (8/16) with EF 20%, severe scar in RCA and probably LCx territory, minimal peri-infarct ischemia.   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  - Unstable angina 4/17: 85% distal LM with  90% ostial LCx stenosis.  LIMA was patent but proximal LAD, proximal RCA, and all SVGs were totally occluded. He had successful DES to LM and PTCA to ostial LCx with Impella support  - Cardiolite (11/17): EF 20%, prior MI, no ischemia.  4. Ischemic Cardiomyopathy: Medtronic CRT-D device.   - Echo (6/15) with EF 25-30%, moderate LV dilation, inferior and inferolateral akinesis, moderately decreased RV systolic function, mild MR.   - RHC (11/15) with mean RA 5, PA 23/6, mean PCWP 9, CI  2.55.  - Echo (4/17): EF 25-30% with mild MR.  - Echo (11/17): EF 25-30%, moderately dilated LV, grade II diastolic dysfunction, mild-moderate MR, mildly decreased RV systolic function.  5. H/o cholecystectomy 6. OA 7. Depression 8. Type II diabetes 9. GERD 10. CKD 11. Paroxsymal A Fib - Noted in 2016 for 1 episode of 3 hrs. Not thought to be significant burden to anticoagulate  SH: Married, prior smoker (many years ago), lives in [place].    FH: CAD  Review of systems complete and found to be negative unless listed in HPI.    Current Outpatient Medications  Medication Sig Dispense Refill  . allopurinol (ZYLOPRIM) 100 MG tablet Take 100 mg by mouth daily.     Marland Kitchen aspirin EC 81 MG tablet Take 81 mg by mouth daily.    Marland Kitchen atorvastatin (LIPITOR) 80 MG tablet TAKE 1 TABLET BY MOUTH DAILY AT 6:00 PM 90 tablet 6  . carvedilol (COREG) 25 MG tablet Take 0.5 tablets (12.5 mg total) by mouth 2 (two) times daily with a meal. 30 tablet 3  . clopidogrel (PLAVIX) 75 MG tablet Take 1 tablet (75 mg total) by mouth daily. 90 tablet 3  . eplerenone (INSPRA) 25 MG tablet Take 1 tablet (25 mg total) by mouth daily. 30 tablet 3  . furosemide (LASIX) 40 MG tablet Take 1 tablet (40 mg total) by mouth 2 (two) times daily. 60 tablet 3  . Insulin Lispro Prot & Lispro (HUMALOG MIX 50/50 KWIKPEN) (50-50) 100 UNIT/ML Kwikpen 15 units at breakfast and lunch, 30 units before supper.  Take insulin 10 minutes before eating 15 mL 0  . Insulin Pen Needle (RELION PEN NEEDLES) 32G X 4 MM MISC Use to inject insulin 3 times per day. 150 each 2  . insulin regular human CONCENTRATED (HUMULIN R U-500 KWIKPEN) 500 UNIT/ML kwikpen 30 units before BFST, 20 at lunch and 35 before supper, take 30-60 minutes before each meal 2 pen 3  . nitroGLYCERIN (NITROSTAT) 0.4 MG SL tablet Place 1 tablet (0.4 mg total) under the tongue every 5 (five) minutes x 3 doses as needed. For chest pain. 25 tablet 3  . ONE TOUCH ULTRA TEST test strip Use  to check blood sugar 3 times per day. 150 each 3  . ONETOUCH DELICA LANCETS 85I MISC Use to check blood sugar 3 times per day dx code E11.9 100 each 3  . pantoprazole (PROTONIX) 40 MG tablet TAKE ONE TABLET BY MOUTH ONCE DAILY 30 tablet 11  . ranolazine (RANEXA) 1000 MG SR tablet Take 500 mg by mouth 2 (two) times daily.    Marland Kitchen venlafaxine (EFFEXOR-XR) 150 MG 24 hr capsule Take 150 mg by mouth at bedtime.     Marland Kitchen zolpidem (AMBIEN CR) 12.5 MG CR tablet Take 12.5 mg by mouth at bedtime. For sleep     No current facility-administered medications for this encounter.    There were no vitals taken for this visit.   Wt Readings from Last  3 Encounters:  05/11/17 200 lb 12.8 oz (91.1 kg)  05/03/17 194 lb 10.7 oz (88.3 kg)  04/18/17 200 lb 6.4 oz (90.9 kg)    General: Well appearing. No resp difficulty. HEENT: Normal Neck: Supple. JVP 5-6. Carotids 2+ bilat; no bruits. No thyromegaly or nodule noted. Cor: PMI nondisplaced. RRR, No M/G/R noted Lungs: CTAB, normal effort. Abdomen: Soft, non-tender, non-distended, no HSM. No bruits or masses. +BS  Extremities: No cyanosis, clubbing, or rash. R and LLE no edema.  Neuro: Alert & orientedx3, cranial nerves grossly intact. moves all 4 extremities w/o difficulty. Affect pleasant   Assessment/Plan: 1. Chronic systolic CHF: Ischemic cardiomyopathy.  NYHA class II symptoms, stable.  EF 25-30% in 11/17 (stable). He has a Medtronic CRT-D device, >99% BiV pacing today.  Recently stopped Lasix, lisinopril, and eplerenone due to AKI.  Creatinine came back down to 1.48 when last checked.  NYHA class III symptoms currently, volume overloaded by exam and Optivol, some of his dyspnea/fatigue is likely due to URI/acute bronchitis.   - NYHA II symptoms - Volume status stable on exam and optivol.  - Continue eplerenone 25 mg daily.  - Continue Lasix at 40 mg BID.  - BP did not tolerate Entresto.  Off lisinopril with recent AKI, with cough that patient thinks is from  lisinopril, will start back on Losartan at 12.5 mg qhs.  - Continue Coreg 12.5 mg bid. - Repeat echo at next visit.  2. CAD: s/p CABG and redo. Had DES to distal LM, angioplasty to ostial LCx in 4/17.  Cardiolite 11/17 with scar, no ischemia.   - No s/s of ischemia.    - Continue ASA 81, Plavix, statin.  - Continue Coreg and ranolazine 500 mg bid.  3. CKD stage III: Recent AKI with improvement of creatinine.  - Starting back on ACE/ARB today. BMET today and 2 weeks.  4. Hyperlipidemia: Good lipids 5/18.    5. Palpitations- - Unclear source. No atrial arrythmias on CRT interrogation.  - Has distant history of PAF, but as above no occurrence and not thought to be large enough burden for [facility]. - If worsens could consider event monitor.   Meds and labs as above. Repeat BMET 2 weeks. RTC 4-6 weeks with Echo.   Shirley Friar, PA-C  05/31/2017  Greater than 50% of the 25 minute visit was spent in counseling/coordination of care regarding disease state education, salt/fluid restriction, sliding scale diuretics, and medication compliance.

## 2017-06-01 ENCOUNTER — Telehealth: Payer: Self-pay

## 2017-06-01 NOTE — Telephone Encounter (Signed)
Patient returning call.

## 2017-06-01 NOTE — Telephone Encounter (Signed)
Completed call back. See ICM note 05/31/2017

## 2017-06-01 NOTE — Telephone Encounter (Signed)
Remote ICM transmission received.  Attempted call to patient and no answer   

## 2017-06-01 NOTE — Progress Notes (Addendum)
Spoke with wife after receiving verbal permission from patient.  Advised remote transmission was normal.  She said he is feeling much better.  Advised to fill out DPR form at next visit with cardiologist and she will do that at next visit. No changes today. Confirmed he is taking Lasix as prescribed. Provided my direct ICM number.

## 2017-06-01 NOTE — Progress Notes (Addendum)
EPIC Encounter for ICM Monitoring  Patient Name: [name] is a 77 y.o. male Date: 06/01/2017 Primary Care Physican: Mayra Neer, MD Primary Cardiologist:[place] Electrophysiologist: Allred Dry Weight:Previous weight 195lbs ( baseline 191 -192 lbs) Bi-V Pacing: 97.7%             Attempted call to patient and unable to reach.  Transmission reviewed.   ER visit on 05/01/2017 and Lasix stopped due to increase in Creatinine.  Back on Lasix bid as of 05/11/2017.   Thoracic impedance normal.  Prescribed dosage: Furosemide 40 mg 1 tablet(40 mg total) by mouth 2 (two) times daily.    Labs: 05/31/2017 Creatinine 1.54, BUN 33, Potassium 4.1, Sodium 134, EGFR 43-50 05/11/2017 Creatinine 1.49, BUN 23, Potassium 4.7, Sodium 134, EGFR 44-52  05/04/2017 Creatinine 1.65, BUN 42, Potassium 4.5, Sodium 131, EGFR 39-46  05/03/2017 Creatinine 1.99, BUN 66, Potassium 4.1, Sodium 136, EGFR 31-36  05/02/2017 Creatinine 2.87, BUN 90, Potassium 4.7, Sodium 131, EGFR 20-23  05/01/2017 Creatinine 3.00, BUN 79, Potassium 5.2, Sodium 132  03/23/2017 Creatinine 1.44, BUN 21, Potassium 4.1, Sodium 139, EGFR 46-54 01/16/2017 Creatinine 1.28, BUN 18, Potassium 3.5, Sodium 137, EGFR 53->60 09/21/2016 Creatinine 1.49, BUN 27, Potassium 4.1, Sodium 135, EGFR 44-52 09/14/2016 Creatinine 1.62, BUN 36, Potassium 4.5, Sodium 135, EGFR 42-51 09/12/2016 Creatinine 1.74, BUN 38, Potassium 5.0, Sodium 135, EGFR 37-43 08/29/2016 Creatinine 1.30, BUN 17, Potassium 2.9, Sodium 139, EGFR 53->60, BNP 391.5 06/14/2016 Creatinine 1.43, BUN 26, Potassium 4.0, Sodium 131, EGFR 47-55 05/25/2016 Creatinine 1.39, BUN 20, Potassium 3.7, Sodium 137, EGFR 49-56  10/13/2017Creatinine 1.31, BUN 19, Potassium 3.9, Sodium 138, EGFR 52->60  Recommendations: NONE - Unable to reach.  Follow-up plan: ICM clinic phone appointment on 07/02/2017.   Copy of ICM check sent to Dr. Rayann Heman.   3 month ICM trend: 06/01/2017    1 Year ICM  trend:       [name], RN 06/01/2017 1:26 PM

## 2017-06-06 ENCOUNTER — Encounter: Payer: Self-pay | Admitting: Cardiology

## 2017-06-08 ENCOUNTER — Ambulatory Visit (HOSPITAL_COMMUNITY)
Admission: RE | Admit: 2017-06-08 | Discharge: 2017-06-08 | Disposition: A | Discharge status: Home / Self Care | Payer: Medicare HMO | Source: Ambulatory Visit | Attending: Cardiology | Admitting: Cardiology

## 2017-06-08 DIAGNOSIS — I5022 Chronic systolic (congestive) heart failure: Secondary | ICD-10-CM | POA: Insufficient documentation

## 2017-06-08 LAB — BASIC METABOLIC PANEL
ANION GAP: 11 (ref 5–15)
BUN: 28 mg/dL — ABNORMAL HIGH (ref 6–20)
CHLORIDE: 100 mmol/L — AB (ref 101–111)
CO2: 24 mmol/L (ref 22–32)
Calcium: 9 mg/dL (ref 8.9–10.3)
Creatinine, Ser: 1.6 mg/dL — ABNORMAL HIGH (ref 0.61–1.24)
GFR calc non Af Amer: 41 mL/min — ABNORMAL LOW (ref >60)
GFR, EST AFRICAN AMERICAN: 47 mL/min — AB (ref >60)
Glucose, Bld: 258 mg/dL — ABNORMAL HIGH (ref 65–99)
Potassium: 3.7 mmol/L (ref 3.5–5.1)
SODIUM: 135 mmol/L (ref 135–145)

## 2017-06-15 LAB — CUP PACEART REMOTE DEVICE CHECK
Battery Voltage: 2.64 V
Brady Statistic AP VS Percent: 0.01 %
Brady Statistic RA Percent Paced: 1.3 %
Date Time Interrogation Session: 20190212165103
HIGH POWER IMPEDANCE MEASURED VALUE: 44 Ohm
HIGH POWER IMPEDANCE MEASURED VALUE: 55 Ohm
HighPow Impedance: 456 Ohm
Implantable Lead Implant Date: 20111003
Implantable Lead Implant Date: 20130509
Implantable Lead Location: 753858
Implantable Lead Location: 753859
Implantable Lead Model: 4196
Implantable Lead Model: 5076
Implantable Lead Model: 6947
Implantable Pulse Generator Implant Date: 20130509
Lead Channel Impedance Value: 418 Ohm
Lead Channel Pacing Threshold Amplitude: 0.875 V
Lead Channel Pacing Threshold Pulse Width: 0.4 ms
Lead Channel Pacing Threshold Pulse Width: 0.4 ms
Lead Channel Sensing Intrinsic Amplitude: 31.625 mV
Lead Channel Sensing Intrinsic Amplitude: 31.625 mV
Lead Channel Setting Pacing Amplitude: 2 V
Lead Channel Setting Pacing Amplitude: 2.5 V
Lead Channel Setting Pacing Amplitude: 5 V
Lead Channel Setting Pacing Pulse Width: 0.4 ms
Lead Channel Setting Sensing Sensitivity: 0.3 mV
MDC IDC LEAD IMPLANT DT: 20111003
MDC IDC LEAD LOCATION: 753860
MDC IDC MSMT LEADCHNL LV IMPEDANCE VALUE: 399 Ohm
MDC IDC MSMT LEADCHNL LV IMPEDANCE VALUE: 399 Ohm
MDC IDC MSMT LEADCHNL LV IMPEDANCE VALUE: 665 Ohm
MDC IDC MSMT LEADCHNL RA PACING THRESHOLD AMPLITUDE: 2.375 V
MDC IDC MSMT LEADCHNL RA PACING THRESHOLD PULSEWIDTH: 0.4 ms
MDC IDC MSMT LEADCHNL RA SENSING INTR AMPL: 1.375 mV
MDC IDC MSMT LEADCHNL RA SENSING INTR AMPL: 1.375 mV
MDC IDC MSMT LEADCHNL RV IMPEDANCE VALUE: 532 Ohm
MDC IDC MSMT LEADCHNL RV PACING THRESHOLD AMPLITUDE: 0.625 V
MDC IDC SET LEADCHNL RV PACING PULSEWIDTH: 0.4 ms
MDC IDC STAT BRADY AP VP PERCENT: 1.35 %
MDC IDC STAT BRADY AS VP PERCENT: 97.32 %
MDC IDC STAT BRADY AS VS PERCENT: 1.32 %
MDC IDC STAT BRADY RV PERCENT PACED: 93.26 %

## 2017-06-18 DIAGNOSIS — G4700 Insomnia, unspecified: Secondary | ICD-10-CM | POA: Diagnosis not present

## 2017-06-18 DIAGNOSIS — R69 Illness, unspecified: Secondary | ICD-10-CM | POA: Diagnosis not present

## 2017-06-18 DIAGNOSIS — I130 Hypertensive heart and chronic kidney disease with heart failure and stage 1 through stage 4 chronic kidney disease, or unspecified chronic kidney disease: Secondary | ICD-10-CM | POA: Diagnosis not present

## 2017-06-18 DIAGNOSIS — M1990 Unspecified osteoarthritis, unspecified site: Secondary | ICD-10-CM | POA: Diagnosis not present

## 2017-06-18 DIAGNOSIS — Z7984 Long term (current) use of oral hypoglycemic drugs: Secondary | ICD-10-CM | POA: Diagnosis not present

## 2017-06-18 DIAGNOSIS — E7800 Pure hypercholesterolemia, unspecified: Secondary | ICD-10-CM | POA: Diagnosis not present

## 2017-06-18 DIAGNOSIS — I209 Angina pectoris, unspecified: Secondary | ICD-10-CM | POA: Diagnosis not present

## 2017-06-18 DIAGNOSIS — N183 Chronic kidney disease, stage 3 (moderate): Secondary | ICD-10-CM | POA: Diagnosis not present

## 2017-06-18 DIAGNOSIS — E1121 Type 2 diabetes mellitus with diabetic nephropathy: Secondary | ICD-10-CM | POA: Diagnosis not present

## 2017-06-18 DIAGNOSIS — I5022 Chronic systolic (congestive) heart failure: Secondary | ICD-10-CM | POA: Diagnosis not present

## 2017-06-26 ENCOUNTER — Other Ambulatory Visit: Payer: Self-pay | Admitting: Internal Medicine

## 2017-06-26 DIAGNOSIS — I252 Old myocardial infarction: Secondary | ICD-10-CM | POA: Diagnosis not present

## 2017-06-26 DIAGNOSIS — G4700 Insomnia, unspecified: Secondary | ICD-10-CM | POA: Diagnosis not present

## 2017-06-26 DIAGNOSIS — I509 Heart failure, unspecified: Secondary | ICD-10-CM | POA: Diagnosis not present

## 2017-06-26 DIAGNOSIS — Z794 Long term (current) use of insulin: Secondary | ICD-10-CM | POA: Diagnosis not present

## 2017-06-26 DIAGNOSIS — E119 Type 2 diabetes mellitus without complications: Secondary | ICD-10-CM | POA: Diagnosis not present

## 2017-06-26 DIAGNOSIS — E785 Hyperlipidemia, unspecified: Secondary | ICD-10-CM | POA: Diagnosis not present

## 2017-06-26 DIAGNOSIS — R69 Illness, unspecified: Secondary | ICD-10-CM | POA: Diagnosis not present

## 2017-06-26 DIAGNOSIS — I499 Cardiac arrhythmia, unspecified: Secondary | ICD-10-CM | POA: Diagnosis not present

## 2017-06-26 DIAGNOSIS — I110 Hypertensive heart disease with heart failure: Secondary | ICD-10-CM | POA: Diagnosis not present

## 2017-06-26 DIAGNOSIS — I2510 Atherosclerotic heart disease of native coronary artery without angina pectoris: Secondary | ICD-10-CM | POA: Diagnosis not present

## 2017-07-02 ENCOUNTER — Ambulatory Visit (INDEPENDENT_AMBULATORY_CARE_PROVIDER_SITE_OTHER): Payer: Medicare HMO

## 2017-07-02 DIAGNOSIS — I5022 Chronic systolic (congestive) heart failure: Secondary | ICD-10-CM

## 2017-07-02 DIAGNOSIS — Z95810 Presence of automatic (implantable) cardiac defibrillator: Secondary | ICD-10-CM

## 2017-07-02 NOTE — Progress Notes (Signed)
EPIC Encounter for ICM Monitoring  Patient Name: [name] is a 77 y.o. male Date: 07/02/2017 Primary Care Physican: Mayra Neer, MD Primary Cardiologist:[place] Electrophysiologist: Allred Dry Weight:195lbs (baseline 191 -192 lbs) Bi-V Pacing: 97.6%      Heart Failure questions reviewed, pt symptomatic with ankle swelling but has almost resolved.   Thoracic impedance abnormal suggesting fluid accumulation since 05/31/2017 and Fluid index > threshold.  Prescribed dosage: Furosemide 40 mg 1 tablet(40 mg total) by mouth 2 (two) times daily. Confirmed today he is taking as prescribed.   Labs: 06/08/2017 Creatinine 1.60, BUN 28, Potassium 3.7, Sodium 135, EGFR 41-47 05/31/2017 Creatinine 1.54, BUN 33, Potassium 4.1, Sodium 134, EGFR 43-50 05/11/2017 Creatinine 1.49, BUN 23, Potassium 4.7, Sodium 134, EGFR 44-52  05/04/2017 Creatinine 1.65, BUN 42, Potassium 4.5, Sodium 131, EGFR 39-46  05/03/2017 Creatinine 1.99, BUN 66, Potassium 4.1, Sodium 136, EGFR 31-36  05/02/2017 Creatinine 2.87, BUN 90, Potassium 4.7, Sodium 131, EGFR 20-23  05/01/2017 Creatinine 3.00, BUN 79, Potassium 5.2, Sodium 132  03/23/2017 Creatinine 1.44, BUN 21, Potassium 4.1, Sodium 139, EGFR 46-54 01/16/2017 Creatinine 1.28, BUN 18, Potassium 3.5, Sodium 137, EGFR 53->60 09/21/2016 Creatinine 1.49, BUN 27, Potassium 4.1, Sodium 135, EGFR 44-52 09/14/2016 Creatinine 1.62, BUN 36, Potassium 4.5, Sodium 135, EGFR 42-51 09/12/2016 Creatinine 1.74, BUN 38, Potassium 5.0, Sodium 135, EGFR 37-43 08/29/2016 Creatinine 1.30, BUN 17, Potassium 2.9, Sodium 139, EGFR 53->60, BNP 391.5 06/14/2016 Creatinine 1.43, BUN 26, Potassium 4.0, Sodium 131, EGFR 47-55 05/25/2016 Creatinine 1.39, BUN 20, Potassium 3.7, Sodium 137, EGFR 49-56  10/13/2017Creatinine 1.31, BUN 19, Potassium 3.9, Sodium 138, EGFR 52->60  Recommendations: No changes.  Encouraged to call for fluid symptoms.  Follow-up plan: ICM clinic phone  appointment on 07/19/2017 since he has an office appointment scheduled 07/11/2017 with Dr. Aundra Dubin.  Copy of ICM check sent to Dr. Rayann Heman and Dr. Aundra Dubin.   3 month ICM trend: 07/02/2017    1 Year ICM trend:       [name], RN 07/02/2017 3:45 PM

## 2017-07-04 NOTE — Progress Notes (Signed)
Increase Lasix to 60 mg po bid, BMET in 1 week.

## 2017-07-05 MED ORDER — FUROSEMIDE 40 MG PO TABS
ORAL_TABLET | ORAL | 3 refills | Status: DC
Start: 2017-07-05 — End: 2017-07-20

## 2017-07-05 NOTE — Progress Notes (Signed)
Call to patient.  Advised Dr Aundra Dubin ordered to increase Lasix 40 mg to 1.5 tablets (60 mg total) twice a day.  He will need labs drawn in a week and will place the order for the same day as office visit with Dr Aundra Dubin on 07/11/2017.  He verbalized understanding. He has enough Lasix on hand for the increased dosage.  Advised him will send script sent to pharmacy with note not to fill until patient calls. He verbalized understanding of change.

## 2017-07-06 ENCOUNTER — Other Ambulatory Visit (HOSPITAL_COMMUNITY): Payer: Self-pay

## 2017-07-06 DIAGNOSIS — I5022 Chronic systolic (congestive) heart failure: Secondary | ICD-10-CM

## 2017-07-11 ENCOUNTER — Other Ambulatory Visit (HOSPITAL_COMMUNITY): Payer: Medicare HMO

## 2017-07-11 ENCOUNTER — Ambulatory Visit (HOSPITAL_BASED_OUTPATIENT_CLINIC_OR_DEPARTMENT_OTHER)
Admission: RE | Admit: 2017-07-11 | Discharge: 2017-07-11 | Disposition: A | Discharge status: Home / Self Care | Payer: Medicare HMO | Source: Ambulatory Visit | Attending: Cardiology | Admitting: Cardiology

## 2017-07-11 ENCOUNTER — Encounter (HOSPITAL_COMMUNITY): Payer: Self-pay | Admitting: Cardiology

## 2017-07-11 ENCOUNTER — Ambulatory Visit (HOSPITAL_COMMUNITY)
Admission: RE | Admit: 2017-07-11 | Discharge: 2017-07-11 | Disposition: A | Discharge status: Home / Self Care | Payer: Medicare HMO | Source: Ambulatory Visit | Attending: Family Medicine | Admitting: Family Medicine

## 2017-07-11 VITALS — BP 90/55 | HR 82 | Wt 195.4 lb

## 2017-07-11 DIAGNOSIS — I5022 Chronic systolic (congestive) heart failure: Secondary | ICD-10-CM

## 2017-07-11 DIAGNOSIS — N183 Chronic kidney disease, stage 3 unspecified: Secondary | ICD-10-CM

## 2017-07-11 DIAGNOSIS — Z7982 Long term (current) use of aspirin: Secondary | ICD-10-CM | POA: Diagnosis not present

## 2017-07-11 DIAGNOSIS — K219 Gastro-esophageal reflux disease without esophagitis: Secondary | ICD-10-CM | POA: Diagnosis not present

## 2017-07-11 DIAGNOSIS — E119 Type 2 diabetes mellitus without complications: Secondary | ICD-10-CM | POA: Diagnosis not present

## 2017-07-11 DIAGNOSIS — Z79899 Other long term (current) drug therapy: Secondary | ICD-10-CM | POA: Insufficient documentation

## 2017-07-11 DIAGNOSIS — Z955 Presence of coronary angioplasty implant and graft: Secondary | ICD-10-CM | POA: Diagnosis not present

## 2017-07-11 DIAGNOSIS — Z9049 Acquired absence of other specified parts of digestive tract: Secondary | ICD-10-CM | POA: Diagnosis not present

## 2017-07-11 DIAGNOSIS — Z8249 Family history of ischemic heart disease and other diseases of the circulatory system: Secondary | ICD-10-CM | POA: Diagnosis not present

## 2017-07-11 DIAGNOSIS — N179 Acute kidney failure, unspecified: Secondary | ICD-10-CM | POA: Diagnosis not present

## 2017-07-11 DIAGNOSIS — I255 Ischemic cardiomyopathy: Secondary | ICD-10-CM | POA: Diagnosis not present

## 2017-07-11 DIAGNOSIS — Z951 Presence of aortocoronary bypass graft: Secondary | ICD-10-CM | POA: Insufficient documentation

## 2017-07-11 DIAGNOSIS — I2582 Chronic total occlusion of coronary artery: Secondary | ICD-10-CM | POA: Insufficient documentation

## 2017-07-11 DIAGNOSIS — I110 Hypertensive heart disease with heart failure: Secondary | ICD-10-CM | POA: Insufficient documentation

## 2017-07-11 DIAGNOSIS — Z7902 Long term (current) use of antithrombotics/antiplatelets: Secondary | ICD-10-CM | POA: Diagnosis not present

## 2017-07-11 DIAGNOSIS — I2510 Atherosclerotic heart disease of native coronary artery without angina pectoris: Secondary | ICD-10-CM | POA: Diagnosis not present

## 2017-07-11 DIAGNOSIS — F329 Major depressive disorder, single episode, unspecified: Secondary | ICD-10-CM | POA: Diagnosis not present

## 2017-07-11 DIAGNOSIS — I252 Old myocardial infarction: Secondary | ICD-10-CM | POA: Insufficient documentation

## 2017-07-11 DIAGNOSIS — E785 Hyperlipidemia, unspecified: Secondary | ICD-10-CM | POA: Diagnosis not present

## 2017-07-11 DIAGNOSIS — R42 Dizziness and giddiness: Secondary | ICD-10-CM | POA: Insufficient documentation

## 2017-07-11 DIAGNOSIS — M109 Gout, unspecified: Secondary | ICD-10-CM | POA: Diagnosis not present

## 2017-07-11 DIAGNOSIS — Z87891 Personal history of nicotine dependence: Secondary | ICD-10-CM | POA: Insufficient documentation

## 2017-07-11 DIAGNOSIS — Z794 Long term (current) use of insulin: Secondary | ICD-10-CM | POA: Insufficient documentation

## 2017-07-11 LAB — BASIC METABOLIC PANEL
Anion gap: 11 (ref 5–15)
BUN: 20 mg/dL (ref 6–20)
CHLORIDE: 103 mmol/L (ref 101–111)
CO2: 26 mmol/L (ref 22–32)
Calcium: 9.5 mg/dL (ref 8.9–10.3)
Creatinine, Ser: 1.34 mg/dL — ABNORMAL HIGH (ref 0.61–1.24)
GFR calc Af Amer: 59 mL/min — ABNORMAL LOW (ref >60)
GFR, EST NON AFRICAN AMERICAN: 51 mL/min — AB (ref >60)
Glucose, Bld: 175 mg/dL — ABNORMAL HIGH (ref 65–99)
POTASSIUM: 4 mmol/L (ref 3.5–5.1)
SODIUM: 140 mmol/L (ref 135–145)

## 2017-07-11 LAB — LIPID PANEL
CHOL/HDL RATIO: 3.6 ratio
CHOLESTEROL: 104 mg/dL (ref 0–200)
HDL: 29 mg/dL — AB (ref >40)
LDL Cholesterol: 49 mg/dL (ref 0–99)
Triglycerides: 131 mg/dL (ref <150)
VLDL: 26 mg/dL (ref 0–40)

## 2017-07-11 MED ORDER — LOSARTAN POTASSIUM 25 MG PO TABS: 12.5000 mg | ORAL_TABLET | Freq: Every day | ORAL | 3 refills | Status: DC

## 2017-07-11 NOTE — Progress Notes (Signed)
Advanced Heart Failure Clinic Note   Patient ID: [name], male   DOB: January 25, 1941, 77 y.o.   MRN: [id number] PCP: Dr. Lynnda Child Cardiology: Dr Aundra Dubin  56 yo with history of CAD s/p CABG and redo CABG as well as ischemic cardiomyopathy with CRT-D device presents for followup of CHF and CAD.  He had a Cardiolite in the 4/15 showing lateral wall ischemia.  LHC at that time showed all his vein grafts from both CABG surgeries occluded.  The LIMA-LAD was patent and there was a 90% distal LM stenosis.  The lateral ischemia likely corresponded to LCx territory downstream from the LM stenosis.  PCI of the distal LM was thought to be high risk and characteristics of the lesion were not favorable for PCI.  Last echo showed EF 25-30% with moderate RV systolic dysfunction.  He had RHC in 11/15 with normal filling pressures and relatively preserved cardiac index (2.55).    In 4/17, he was admitted with chest pain concerning for unstable angina.  Angiography showed 85% distal LM with 90% ostial LCx stenosis.  LIMA was patent but proximal LAD, proximal RCA, and all SVGs were totally occluded. High had successful DES to LM and PTCA to ostial LCx with Impella support.  [name] was stopped and he was put back on low dose lisinopril due to symptomatic hypotension.  He had Cardiolite in 11/17 with scar, no ischemia.   He was admitted in 1/19 with AKI, creatinine up to 3. Meds held then restarted.   He returns for followup of CHF and CAD.  He has been feeling good.  Weight is up 4 lbs.  He works out in the yard, gets some fatigue with yardwork. No chest pain.  No orthopnea/PND.  No dyspnea walking on flat ground.  OK with 1 flight steps.  He has occasional lightheadedness with stand, but this is rare now and less than in the past.  He lost his balance and fell in the bathroom about a week ago.   Echo today showed EF 30-35%, normal RV size with decreased systolic function.   Medtronic device interrogation: Fluid  index > threshold, impedance down.  No atrial fibrillation, > 99% BiV pacing  Labs (9/15): LDL particle number 816, LDL 57, LFTs normal Labs (10/15): K 4.4, creatinine 1.4 Labs (11/15): K 4.3, creatinine 1.36 Labs (12/15): K 4.8, creatinine 1.34 Labs (3/16): K 4, creatinine 1.38, LDL 46, HDl 35 Labs (7/16): K 4, creatinine 1.39, BNP 211 Labs (03/03/15): K 4.1, creatinine 1.47, BNP 227, LDL 80, HDL 32 Labs (12/16): K 4.6, creatinine 1.12, LDL 68, HDL 34 Labs (4/17): K 3.4, creatinine 1.15 Labs (6/17): K 3.8, creatinine 1.67 Labs (7/17): K 4, creatinine 1.35 Labs (10/17): K 3.9, creatinine 1.31, LDL 45, HDL 30 Labs (2/18): K 4, creatinine 1.43 Labs (5/18): LDL 44, HDL 27 Labs (6/18): K 4.1, creatinine 1.49 Labs (12/18): K 4.4, creatinine 1.44 Labs (1/19): K 4.3, creatinine 1.48 Labs (2/19): K 3.7, creatinine 1.6  ECG (personally reviewed): NSR, BiV pacing  PMH: 1. Gout 2. Hyperlipidemia 3. CAD: CABG 1997 and redo 11/12.   - LHC (4/15) with totally occluded LAD, totally occluded RCA, 80% distal LM, 50% mLCx, 2 SVG-RCA grafts totally occluded, 2 SVG-OM grafts totally occluded, patent LIMA-LAD.  Cardiolite prior to 4/15 cath showed lateral wall ischemia (LCx territory).  PCI to distal LM would be a high risk procedure.   - Cardiolite (8/16) with EF 20%, severe scar in RCA and probably LCx territory, minimal  peri-infarct ischemia.   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  - Unstable angina 4/17: 85% distal LM with 90% ostial LCx stenosis.  LIMA was patent but proximal LAD, proximal RCA, and all SVGs were totally occluded. He had successful DES to LM and PTCA to ostial LCx with Impella support  - Cardiolite (11/17): EF 20%, prior MI, no ischemia.  4. Ischemic Cardiomyopathy: Medtronic CRT-D device.   - Echo (6/15) with EF 25-30%, moderate LV dilation, inferior and inferolateral akinesis, moderately decreased RV systolic function, mild MR.   - RHC (11/15)  with mean RA 5, PA 23/6, mean PCWP 9, CI 2.55.  - Echo (4/17): EF 25-30% with mild MR.  - Echo (11/17): EF 25-30%, moderately dilated LV, grade II diastolic dysfunction, mild-moderate MR, mildly decreased RV systolic function.  - Echo (3/19): EF 30-35%, inferior/inferolateral akinesis, normal RV size with mildly decreased systolic function.  5. H/o cholecystectomy 6. OA 7. Depression 8. Type II diabetes 9. GERD 10. CKD  SH: Married, prior smoker (many years ago), lives in [place].    FH: CAD  ROS: All systems reviewed and negative except as per HPI.   Current Outpatient Medications  Medication Sig Dispense Refill  . allopurinol (ZYLOPRIM) 100 MG tablet Take 100 mg by mouth daily.     Marland Kitchen aspirin EC 81 MG tablet Take 81 mg by mouth daily.    Marland Kitchen atorvastatin (LIPITOR) 80 MG tablet TAKE 1 TABLET BY MOUTH DAILY AT 6:00 PM 90 tablet 6  . carvedilol (COREG) 25 MG tablet Take 0.5 tablets (12.5 mg total) by mouth 2 (two) times daily with a meal. 30 tablet 3  . clopidogrel (PLAVIX) 75 MG tablet Take 1 tablet (75 mg total) by mouth daily. 90 tablet 3  . eplerenone (INSPRA) 25 MG tablet Take 1 tablet (25 mg total) by mouth daily. 30 tablet 3  . furosemide (LASIX) 40 MG tablet Take 1.5 tablets (60 mg total) by mouth twice a day. 135 tablet 3  . Insulin Lispro Prot & Lispro (HUMALOG MIX 50/50 KWIKPEN) (50-50) 100 UNIT/ML Kwikpen 15 units at breakfast and lunch, 30 units before supper.  Take insulin 10 minutes before eating 15 mL 0  . Insulin Pen Needle (RELION PEN NEEDLES) 32G X 4 MM MISC Use to inject insulin 3 times per day. 150 each 2  . insulin regular human CONCENTRATED (HUMULIN R U-500 KWIKPEN) 500 UNIT/ML kwikpen 30 units before BFST, 20 at lunch and 35 before supper, take 30-60 minutes before each meal 2 pen 3  . losartan (COZAAR) 25 MG tablet Take 0.5 tablets (12.5 mg total) by mouth daily. 15 tablet 3  . nitroGLYCERIN (NITROSTAT) 0.4 MG SL tablet Place 1 tablet (0.4 mg total) under the  tongue every 5 (five) minutes x 3 doses as needed. For chest pain. 25 tablet 3  . ONE TOUCH ULTRA TEST test strip Use to check blood sugar 3 times per day. 150 each 3  . ONETOUCH DELICA LANCETS 44R MISC Use to check blood sugar 3 times per day dx code E11.9 100 each 3  . pantoprazole (PROTONIX) 40 MG tablet TAKE ONE TABLET BY MOUTH ONCE DAILY 30 tablet 11  . ranolazine (RANEXA) 1000 MG SR tablet Take 500 mg by mouth 2 (two) times daily.    Marland Kitchen venlafaxine (EFFEXOR-XR) 150 MG 24 hr capsule Take 150 mg by mouth at bedtime.      No current facility-administered medications for this encounter.    BP Marland Kitchen)  90/55   Pulse 82   Wt 195 lb 6.4 oz (88.6 kg)   SpO2 97%   BMI 28.04 kg/m    Wt Readings from Last 3 Encounters:  07/11/17 195 lb 6.4 oz (88.6 kg)  05/31/17 191 lb (86.6 kg)  05/11/17 200 lb 12.8 oz (91.1 kg)    General: NAD Neck: No JVD, no thyromegaly or thyroid nodule.  Lungs: Clear to auscultation bilaterally with normal respiratory effort. CV: Nondisplaced PMI.  Heart regular S1/S2, no S3/S4, no murmur.  No peripheral edema.  No carotid bruit.  Normal pedal pulses.  Abdomen: Soft, nontender, no hepatosplenomegaly, no distention.  Skin: Intact without lesions or rashes.  Neurologic: Alert and oriented x 3.  Psych: Normal affect. Extremities: No clubbing or cyanosis.  HEENT: Normal.   Assessment/Plan: 1. Chronic systolic CHF: Ischemic cardiomyopathy.  NYHA class II symptoms, stable.  EF 30-35% on echo done today (personally reviewed). He has a Medtronic CRT-D device, >99% BiV pacing today.  NYHA class II symptoms.  His weight is up and thoracic impedance is lower, but on exam, he does not look volume overloaded.  - I will continue Lasix at 60 mg bid. BMET today.   - Continue eplerenone 25 mg daily.  - BP did not tolerate Entresto.  Had ACEI cough.  He is on losartan 25 mg daily with soft BP and occasional lightheadedness.  I will have him decrease losartan to 12.5 mg daily.  -  Continue Coreg 12.5 mg bid. 2. CAD: s/p CABG and redo. Had DES to distal LM, angioplasty to ostial LCx in 4/17.  Cardiolite 11/17 with scar, no ischemia.  No recent chest pain.  - Continue ASA 81, Plavix, statin.  - Continue Coreg and ranolazine 500 mg bid.  3. CKD stage III: Recent AKI with improvement of creatinine. BMET today.  4. Hyperlipidemia: Check lipids today.      Followup in 3 months.   [name] 07/11/2017

## 2017-07-11 NOTE — Patient Instructions (Signed)
Decrease Losartan 12.5 mg (0.5 tab) daily  Labs drawn today (if we do not call you, then your lab work was stable)   Your physician recommends that you schedule a follow-up appointment in: 3 months with Dr. Aundra Dubin

## 2017-07-11 NOTE — Progress Notes (Signed)
  Echocardiogram 2D Echocardiogram has been performed.  Jannett Celestine 07/11/2017, 10:05 AM

## 2017-07-17 ENCOUNTER — Encounter (HOSPITAL_COMMUNITY): Payer: Self-pay

## 2017-07-19 ENCOUNTER — Telehealth: Payer: Self-pay

## 2017-07-19 ENCOUNTER — Ambulatory Visit (INDEPENDENT_AMBULATORY_CARE_PROVIDER_SITE_OTHER): Payer: Self-pay

## 2017-07-19 DIAGNOSIS — I5022 Chronic systolic (congestive) heart failure: Secondary | ICD-10-CM

## 2017-07-19 DIAGNOSIS — Z95810 Presence of automatic (implantable) cardiac defibrillator: Secondary | ICD-10-CM

## 2017-07-19 NOTE — Progress Notes (Signed)
EPIC Encounter for ICM Monitoring  Patient Name: [name] is a 77 y.o. male Date: 07/19/2017 Primary Care Physican: Mayra Neer, MD Primary Cardiologist:[place] Electrophysiologist: Allred Dry Weight:Previous weight 195lbs (baseline 191 -192 lbs) Bi-V Pacing: 97.5%      Attempted call to patient and unable to reach.   Transmission reviewed.    Thoracic impedance abnormal suggesting fluid accumulation but has improved since last ICM transmission 07/02/2017 and Furosemide was increased on 07/02/2017.    Fluid index > threshold  Prescribed dosage: Furosemide 40 mg 1.5 tablets(60 mg total) by mouth 2 (two) times daily.  Labs: 07/11/2017 Creatinine 1.34, BUN 20, Potassium 4.0, Sodium 140, EGFR 51-59 06/08/2017 Creatinine 1.60, BUN 28, Potassium 3.7, Sodium 135, EGFR 41-47 05/31/2017 Creatinine1.54, BUN33, Potassium4.1, Sodium134, EGFR43-50 01/25/2019Creatinine 1.49, BUN23, Potassium4.7, Sodium134, [id number]  01/18/2019Creatinine 1.65, BUN42, Potassium4.5, Sodium131, [id number]  01/17/2019Creatinine1.99, BUN66, Potassium4.1, Sodium136, [id number]  01/16/2019Creatinine 2.87, BUN90, Potassium4.7, Sodium131, [id number]  01/15/2019Creatinine 3.00, BUN79, Potassium5.2, Sodium132 03/23/2017 Creatinine 1.44, BUN 21, Potassium 4.1, Sodium 139, EGFR 46-54 01/16/2017 Creatinine 1.28, BUN 18, Potassium 3.5, Sodium 137, EGFR 53->60 09/21/2016 Creatinine 1.49, BUN 27, Potassium 4.1, Sodium 135, EGFR 44-52 09/14/2016 Creatinine 1.62, BUN 36, Potassium 4.5, Sodium 135, EGFR 42-51 09/12/2016 Creatinine 1.74, BUN 38, Potassium 5.0, Sodium 135, EGFR 37-43 08/29/2016 Creatinine 1.30, BUN 17, Potassium 2.9, Sodium 139, EGFR 53->60, BNP 391.5 06/14/2016 Creatinine 1.43, BUN 26, Potassium 4.0, Sodium 131, EGFR 47-55 05/25/2016 Creatinine 1.39, BUN 20, Potassium 3.7, Sodium 137, EGFR 49-56  10/13/2017Creatinine 1.31, BUN 19, Potassium 3.9, Sodium 138, EGFR  52->60  Recommendations:  NONE - Unable to reach.  Follow-up plan: ICM clinic phone appointment on 08/02/2017.   Copy of ICM check sent to Dr. Rayann Heman and Dr. Aundra Dubin for review.   3 month ICM trend: 07/19/2017    1 Year ICM trend:        [name], RN 07/19/2017 11:42 AM

## 2017-07-19 NOTE — Telephone Encounter (Signed)
Remote ICM transmission received.  Attempted call to patient and mail box has not been set up yet.

## 2017-07-20 MED ORDER — FUROSEMIDE 40 MG PO TABS: ORAL_TABLET | ORAL | 3 refills | Status: DC

## 2017-07-20 NOTE — Progress Notes (Signed)
Increase Lasix to 80 mg bid x 3 days then 80 qam/60 qpm after that.

## 2017-07-20 NOTE — Addendum Note (Signed)
Addended by: [name] on: 07/20/2017 03:03 PM   Modules accepted: Orders

## 2017-07-20 NOTE — Progress Notes (Addendum)
Spoke with patient and wife.  Patient's weight today is 186 lbs and yesterday was 192 lbs.  No swelling of lower extremities and no SOB.  Advised Dr Aundra Dubin ordered to increase Lasix to 80 mg bid x 3 days then 80 qam/60 qpm after that.  She verbalized understanding and wrote down instructions.  She has enough of Lasix on hand and advised a new script will be on file when mediation is needed.  Advised to call if patient has any problems taking increased dosage. New date for ICM recheck is 07/26/2017

## 2017-07-21 ENCOUNTER — Other Ambulatory Visit (HOSPITAL_COMMUNITY): Payer: Self-pay | Admitting: Cardiology

## 2017-07-26 ENCOUNTER — Ambulatory Visit (INDEPENDENT_AMBULATORY_CARE_PROVIDER_SITE_OTHER): Payer: Self-pay

## 2017-07-26 DIAGNOSIS — Z95810 Presence of automatic (implantable) cardiac defibrillator: Secondary | ICD-10-CM

## 2017-07-26 DIAGNOSIS — I5022 Chronic systolic (congestive) heart failure: Secondary | ICD-10-CM

## 2017-07-27 NOTE — Progress Notes (Signed)
EPIC Encounter for ICM Monitoring  Patient Name: [name] is a 77 y.o. male Date: 07/27/2017 Primary Care Physican: Mayra Neer, MD Primary Cardiologist:[place] Electrophysiologist: Allred Dry Weight:189 lbs (baseline 191 -192 lbs) Bi-V Pacing: 95.4%       Heart Failure questions reviewed, pt asymptomatic.  Weight dropped from 192 lbs to 189 lbs         Thoracic impedance returned to baseline normal after Dr Aundra Dubin increased Lasix to 80 mg bid x 3 days and changed long term dosage to then 80 qam/60 qpm.  Prescribed dosage: Furosemide 40 mg 2 tablets(80 mg total) by mouth every morning and 1.5 tablets (60 mg total) every evening.  Labs: 07/11/2017 Creatinine 1.34, BUN 20, Potassium 4.0, Sodium 140, EGFR 51-59 06/08/2017 Creatinine 1.60, BUN 28, Potassium 3.7, Sodium 135, EGFR 41-47 05/31/2017 Creatinine1.54, BUN33, Potassium4.1, Sodium134, EGFR43-50 01/25/2019Creatinine 1.49, BUN23, Potassium4.7, Sodium134, [id number]  01/18/2019Creatinine 1.65, BUN42, Potassium4.5, Sodium131, [id number]  01/17/2019Creatinine1.99, BUN66, Potassium4.1, Sodium136, [id number]  01/16/2019Creatinine 2.87, BUN90, Potassium4.7, Sodium131, [id number]  01/15/2019Creatinine 3.00, BUN79, Potassium5.2, Sodium132 03/23/2017 Creatinine 1.44, BUN 21, Potassium 4.1, Sodium 139, EGFR 46-54 01/16/2017 Creatinine 1.28, BUN 18, Potassium 3.5, Sodium 137, EGFR 53->60 09/21/2016 Creatinine 1.49, BUN 27, Potassium 4.1, Sodium 135, EGFR 44-52 09/14/2016 Creatinine 1.62, BUN 36, Potassium 4.5, Sodium 135, EGFR 42-51 09/12/2016 Creatinine 1.74, BUN 38, Potassium 5.0, Sodium 135, EGFR 37-43 08/29/2016 Creatinine 1.30, BUN 17, Potassium 2.9, Sodium 139, EGFR 53->60, BNP 391.5 06/14/2016 Creatinine 1.43, BUN 26, Potassium 4.0, Sodium 131, EGFR 47-55 05/25/2016 Creatinine 1.39, BUN 20, Potassium 3.7, Sodium 137, EGFR 49-56  10/13/2017Creatinine 1.31, BUN 19, Potassium 3.9, Sodium 138,  EGFR 52->60  Recommendations: No changes.  Encouraged to call for fluid symptoms.  Follow-up plan: ICM clinic phone appointment on 08/30/2017.    Copy of ICM check sent to Dr. Rayann Heman and Dr Aundra Dubin.   3 month ICM trend: 07/26/2017    1 Year ICM trend:       [name], RN 07/27/2017 1:46 PM

## 2017-08-21 ENCOUNTER — Other Ambulatory Visit: Payer: Self-pay | Admitting: Cardiology

## 2017-08-30 ENCOUNTER — Ambulatory Visit (INDEPENDENT_AMBULATORY_CARE_PROVIDER_SITE_OTHER): Payer: Medicare HMO | Admitting: *Deleted

## 2017-08-30 ENCOUNTER — Telehealth: Payer: Self-pay

## 2017-08-30 ENCOUNTER — Ambulatory Visit (INDEPENDENT_AMBULATORY_CARE_PROVIDER_SITE_OTHER): Payer: Medicare HMO

## 2017-08-30 DIAGNOSIS — Z95810 Presence of automatic (implantable) cardiac defibrillator: Secondary | ICD-10-CM

## 2017-08-30 DIAGNOSIS — I5022 Chronic systolic (congestive) heart failure: Secondary | ICD-10-CM | POA: Diagnosis not present

## 2017-08-30 DIAGNOSIS — I255 Ischemic cardiomyopathy: Secondary | ICD-10-CM

## 2017-08-30 NOTE — Progress Notes (Signed)
Remote ICD transmission.   

## 2017-08-30 NOTE — Progress Notes (Signed)
EPIC Encounter for ICM Monitoring  Patient Name: [name] is a 77 y.o. male Date: 08/30/2017 Primary Care Physican: Mayra Neer, MD Primary Cardiologist:[place] Electrophysiologist: Allred Dry Weight:191 lbs (baseline 191 -192 lbs) Bi-V Pacing: 98.6%       Heart Failure questions reviewed, pt reported he feels like he has fluid due to some shortness of breath.    Thoracic impedance abnormal suggesting fluid accumulation.  Prescribed dosage: Furosemide 40 mg 2tablets(80 mg total) by mouth every morning and 1.5 tablets (60 mg total) every evening.  Labs: 07/11/2017 Creatinine 1.34, BUN 20, Potassium 4.0, Sodium 140, EGFR 51-59 06/08/2017 Creatinine 1.60, BUN 28, Potassium 3.7, Sodium 135, EGFR 41-47 05/31/2017 Creatinine1.54, BUN33, Potassium4.1, Sodium134, EGFR43-50 01/25/2019Creatinine 1.49, BUN23, Potassium4.7, Sodium134, [id number]  01/18/2019Creatinine 1.65, BUN42, Potassium4.5, Sodium131, [id number]  01/17/2019Creatinine1.99, BUN66, Potassium4.1, Sodium136, [id number]  01/16/2019Creatinine 2.87, BUN90, Potassium4.7, Sodium131, [id number]  01/15/2019Creatinine 3.00, BUN79, Potassium5.2, Sodium132 03/23/2017 Creatinine 1.44, BUN 21, Potassium 4.1, Sodium 139, EGFR 46-54 01/16/2017 Creatinine 1.28, BUN 18, Potassium 3.5, Sodium 137, EGFR 53->60 09/21/2016 Creatinine 1.49, BUN 27, Potassium 4.1, Sodium 135, EGFR 44-52 09/14/2016 Creatinine 1.62, BUN 36, Potassium 4.5, Sodium 135, EGFR 42-51 09/12/2016 Creatinine 1.74, BUN 38, Potassium 5.0, Sodium 135, EGFR 37-43 08/29/2016 Creatinine 1.30, BUN 17, Potassium 2.9, Sodium 139, EGFR 53->60, BNP 391.5 06/14/2016 Creatinine 1.43, BUN 26, Potassium 4.0, Sodium 131, EGFR 47-55 05/25/2016 Creatinine 1.39, BUN 20, Potassium 3.7, Sodium 137, EGFR 49-56   Recommendations:  Patient said he self adjusted Furosemide to 80 mg twice today and will take twice tomorrow.    Follow-up plan: ICM clinic phone  appointment on 09/06/2017 to recheck fluid levels.  Office appointment scheduled 10/04/2017 with Dr. Aundra Dubin.  Copy of ICM check sent to Dr. Aundra Dubin and Dr. Rayann Heman for review and recommendations if needed.   3 month ICM trend: 08/30/2017    1 Year ICM trend:       [name], RN 08/30/2017 1:27 PM

## 2017-08-30 NOTE — Telephone Encounter (Signed)
Remote ICM transmission received.  Attempted call to patient and wife stated he was not home but should return in the next hour.

## 2017-08-31 ENCOUNTER — Encounter: Payer: Self-pay | Admitting: Cardiology

## 2017-08-31 ENCOUNTER — Telehealth: Payer: Self-pay | Admitting: Internal Medicine

## 2017-08-31 LAB — CUP PACEART REMOTE DEVICE CHECK
Brady Statistic AP VP Percent: 1.62 %
Brady Statistic AP VS Percent: 0.01 %
Brady Statistic AS VP Percent: 97.7 %
Brady Statistic AS VS Percent: 0.67 %
Brady Statistic RV Percent Paced: 97.73 %
Date Time Interrogation Session: 20190514134618
HIGH POWER IMPEDANCE MEASURED VALUE: 399 Ohm
HIGH POWER IMPEDANCE MEASURED VALUE: 50 Ohm
HighPow Impedance: 38 Ohm
Implantable Lead Implant Date: 20111003
Implantable Lead Implant Date: 20130509
Implantable Lead Location: 753858
Implantable Lead Model: 6947
Implantable Pulse Generator Implant Date: 20130509
Lead Channel Impedance Value: 361 Ohm
Lead Channel Impedance Value: 361 Ohm
Lead Channel Impedance Value: 475 Ohm
Lead Channel Impedance Value: 627 Ohm
Lead Channel Pacing Threshold Amplitude: 0.875 V
Lead Channel Pacing Threshold Pulse Width: 0.4 ms
Lead Channel Pacing Threshold Pulse Width: 0.4 ms
Lead Channel Sensing Intrinsic Amplitude: 31.625 mV
Lead Channel Sensing Intrinsic Amplitude: 31.625 mV
Lead Channel Setting Pacing Amplitude: 2 V
Lead Channel Setting Pacing Amplitude: 2.5 V
Lead Channel Setting Pacing Amplitude: 3 V
Lead Channel Setting Pacing Pulse Width: 0.4 ms
Lead Channel Setting Pacing Pulse Width: 0.4 ms
MDC IDC LEAD IMPLANT DT: 20111003
MDC IDC LEAD LOCATION: 753859
MDC IDC LEAD LOCATION: 753860
MDC IDC MSMT BATTERY VOLTAGE: 2.63 V
MDC IDC MSMT LEADCHNL RA IMPEDANCE VALUE: 456 Ohm
MDC IDC MSMT LEADCHNL RA PACING THRESHOLD AMPLITUDE: 2 V
MDC IDC MSMT LEADCHNL RA SENSING INTR AMPL: 1.25 mV
MDC IDC MSMT LEADCHNL RA SENSING INTR AMPL: 1.25 mV
MDC IDC MSMT LEADCHNL RV PACING THRESHOLD AMPLITUDE: 0.625 V
MDC IDC MSMT LEADCHNL RV PACING THRESHOLD PULSEWIDTH: 0.4 ms
MDC IDC SET LEADCHNL RV SENSING SENSITIVITY: 0.3 mV
MDC IDC STAT BRADY RA PERCENT PACED: 1.61 %

## 2017-08-31 NOTE — Telephone Encounter (Signed)
New message     1. Has your device fired? NO  2. Is you device beeping? NO  3. Are you experiencing draining or swelling at device site? NO  4. Are you calling to see if we received your device transmission? Patient returning call    5. Have you passed out? NO    Please route to [place]

## 2017-09-02 NOTE — Progress Notes (Signed)
Increase Lasix to 80 mg bid. BMET 1 week.

## 2017-09-03 MED ORDER — FUROSEMIDE 40 MG PO TABS: ORAL_TABLET | ORAL | 3 refills | Status: DC

## 2017-09-03 NOTE — Progress Notes (Signed)
Call to patient and wife.  Advised Dr Aundra Dubin ordered him to take Furosemide 40 mg2tablets(80 mg total) by mouthtwo times a day.  Also need to have BMET drawn on 09/11/2017. He will have drawn at [facility]. He verbalized understanding.  He has enough Furosemide tablets on hand for the increased dosage.  Advised to call the pharmacy when ready for the new script to be filled.

## 2017-09-03 NOTE — Progress Notes (Signed)
Attempted call to patient to advise of change in Lasix dosage and his wife stated he was outside on the tractor.  Advise will call back this afternoon.

## 2017-09-06 ENCOUNTER — Ambulatory Visit (INDEPENDENT_AMBULATORY_CARE_PROVIDER_SITE_OTHER): Payer: Self-pay

## 2017-09-06 DIAGNOSIS — I5022 Chronic systolic (congestive) heart failure: Secondary | ICD-10-CM

## 2017-09-06 DIAGNOSIS — Z95810 Presence of automatic (implantable) cardiac defibrillator: Secondary | ICD-10-CM

## 2017-09-07 NOTE — Progress Notes (Signed)
EPIC Encounter for ICM Monitoring  Patient Name: TSUNEO FAISON is a 77 y.o. male Date: 09/07/2017 Primary Care Physican: Mayra Neer, MD Primary Cardiologist:[place] Electrophysiologist: Allred Dry Weight:192lbs (baseline 191 -192 lbs) Bi-V Pacing: 97.6%       Heart Failure questions reviewed, pt asymptomatic.  BMET to be drawn on 09/11/2017 at [place]   Thoracic impedance returned close to baseline since increase in Furosemide maintenance dose to 80 mg bid.  Prescribed dosage: Furosemide 40 mg2tablets(80 mg total) by mouthtwo times a day.  Labs: 07/11/2017 Creatinine 1.34, BUN 20, Potassium 4.0, Sodium 140, EGFR 51-59 06/08/2017 Creatinine 1.60, BUN 28, Potassium 3.7, Sodium 135, EGFR 41-47 05/31/2017 Creatinine1.54, BUN33, Potassium4.1, Sodium134, EGFR43-50 01/25/2019Creatinine 1.49, BUN23, Potassium4.7, Sodium134, [id number]  01/18/2019Creatinine 1.65, BUN42, Potassium4.5, Sodium131, [id number]  01/17/2019Creatinine1.99, BUN66, Potassium4.1, Sodium136, [id number]  01/16/2019Creatinine 2.87, BUN90, Potassium4.7, Sodium131, [id number]  01/15/2019Creatinine 3.00, BUN79, Potassium5.2, Sodium132 03/23/2017 Creatinine 1.44, BUN 21, Potassium 4.1, Sodium 139, EGFR 46-54 01/16/2017 Creatinine 1.28, BUN 18, Potassium 3.5, Sodium 137, EGFR 53->60 09/21/2016 Creatinine 1.49, BUN 27, Potassium 4.1, Sodium 135, EGFR 44-52 09/14/2016 Creatinine 1.62, BUN 36, Potassium 4.5, Sodium 135, EGFR 42-51 09/12/2016 Creatinine 1.74, BUN 38, Potassium 5.0, Sodium 135, EGFR 37-43 08/29/2016 Creatinine 1.30, BUN 17, Potassium 2.9, Sodium 139, EGFR 53->60, BNP 391.5 06/14/2016 Creatinine 1.43, BUN 26, Potassium 4.0, Sodium 131, EGFR 47-55 05/25/2016 Creatinine 1.39, BUN 20, Potassium 3.7, Sodium 137, EGFR 49-56   Recommendations: No changes.    Encouraged to call for fluid symptoms.  Follow-up plan: ICM clinic phone appointment on 10/01/2017.   Office appointment scheduled 10/04/2017 with Dr. Aundra Dubin.  Copy of ICM check sent to Dr. Aundra Dubin and Dr. Rayann Heman.   3 month ICM trend: 09/06/2017    1 Year ICM trend:       [name], RN 09/07/2017 9:06 AM

## 2017-09-11 ENCOUNTER — Other Ambulatory Visit (HOSPITAL_COMMUNITY)
Admission: RE | Admit: 2017-09-11 | Discharge: 2017-09-11 | Disposition: A | Discharge status: Home / Self Care | Payer: Medicare HMO | Source: Ambulatory Visit | Attending: Cardiology | Admitting: Cardiology

## 2017-09-11 DIAGNOSIS — I5022 Chronic systolic (congestive) heart failure: Secondary | ICD-10-CM | POA: Diagnosis not present

## 2017-09-11 LAB — BASIC METABOLIC PANEL
ANION GAP: 11 (ref 5–15)
BUN: 33 mg/dL — ABNORMAL HIGH (ref 6–20)
CALCIUM: 9 mg/dL (ref 8.9–10.3)
CO2: 29 mmol/L (ref 22–32)
CREATININE: 1.64 mg/dL — AB (ref 0.61–1.24)
Chloride: 95 mmol/L — ABNORMAL LOW (ref 101–111)
GFR calc Af Amer: 46 mL/min — ABNORMAL LOW (ref >60)
GFR calc non Af Amer: 39 mL/min — ABNORMAL LOW (ref >60)
GLUCOSE: 264 mg/dL — AB (ref 65–99)
Potassium: 4 mmol/L (ref 3.5–5.1)
Sodium: 135 mmol/L (ref 135–145)

## 2017-09-20 ENCOUNTER — Other Ambulatory Visit (HOSPITAL_COMMUNITY): Payer: Self-pay | Admitting: Cardiology

## 2017-09-20 DIAGNOSIS — N183 Chronic kidney disease, stage 3 (moderate): Secondary | ICD-10-CM | POA: Diagnosis not present

## 2017-09-20 DIAGNOSIS — E669 Obesity, unspecified: Secondary | ICD-10-CM | POA: Diagnosis not present

## 2017-09-20 DIAGNOSIS — R69 Illness, unspecified: Secondary | ICD-10-CM | POA: Diagnosis not present

## 2017-09-20 DIAGNOSIS — I25119 Atherosclerotic heart disease of native coronary artery with unspecified angina pectoris: Secondary | ICD-10-CM | POA: Diagnosis not present

## 2017-09-20 DIAGNOSIS — I209 Angina pectoris, unspecified: Secondary | ICD-10-CM | POA: Diagnosis not present

## 2017-09-20 DIAGNOSIS — I130 Hypertensive heart and chronic kidney disease with heart failure and stage 1 through stage 4 chronic kidney disease, or unspecified chronic kidney disease: Secondary | ICD-10-CM | POA: Diagnosis not present

## 2017-09-20 DIAGNOSIS — Z0000 Encounter for general adult medical examination without abnormal findings: Secondary | ICD-10-CM | POA: Diagnosis not present

## 2017-09-20 DIAGNOSIS — E1121 Type 2 diabetes mellitus with diabetic nephropathy: Secondary | ICD-10-CM | POA: Diagnosis not present

## 2017-09-20 DIAGNOSIS — I255 Ischemic cardiomyopathy: Secondary | ICD-10-CM | POA: Diagnosis not present

## 2017-09-20 DIAGNOSIS — I5022 Chronic systolic (congestive) heart failure: Secondary | ICD-10-CM | POA: Diagnosis not present

## 2017-09-24 ENCOUNTER — Observation Stay (HOSPITAL_COMMUNITY)
Admission: EM | Admit: 2017-09-24 | Discharge: 2017-09-25 | Disposition: A | Discharge status: Home / Self Care | Payer: Medicare HMO | Attending: Internal Medicine | Admitting: Internal Medicine

## 2017-09-24 ENCOUNTER — Other Ambulatory Visit: Payer: Self-pay

## 2017-09-24 ENCOUNTER — Emergency Department (HOSPITAL_COMMUNITY): Payer: Medicare HMO

## 2017-09-24 ENCOUNTER — Encounter (HOSPITAL_COMMUNITY): Payer: Self-pay | Admitting: Emergency Medicine

## 2017-09-24 ENCOUNTER — Observation Stay (HOSPITAL_COMMUNITY): Payer: Medicare HMO

## 2017-09-24 DIAGNOSIS — F329 Major depressive disorder, single episode, unspecified: Secondary | ICD-10-CM | POA: Diagnosis not present

## 2017-09-24 DIAGNOSIS — I10 Essential (primary) hypertension: Secondary | ICD-10-CM | POA: Diagnosis present

## 2017-09-24 DIAGNOSIS — N183 Chronic kidney disease, stage 3 unspecified: Secondary | ICD-10-CM

## 2017-09-24 DIAGNOSIS — E785 Hyperlipidemia, unspecified: Secondary | ICD-10-CM | POA: Diagnosis not present

## 2017-09-24 DIAGNOSIS — Z8249 Family history of ischemic heart disease and other diseases of the circulatory system: Secondary | ICD-10-CM | POA: Insufficient documentation

## 2017-09-24 DIAGNOSIS — N179 Acute kidney failure, unspecified: Secondary | ICD-10-CM

## 2017-09-24 DIAGNOSIS — I2510 Atherosclerotic heart disease of native coronary artery without angina pectoris: Secondary | ICD-10-CM | POA: Insufficient documentation

## 2017-09-24 DIAGNOSIS — Z951 Presence of aortocoronary bypass graft: Secondary | ICD-10-CM | POA: Diagnosis not present

## 2017-09-24 DIAGNOSIS — G3189 Other specified degenerative diseases of nervous system: Secondary | ICD-10-CM | POA: Diagnosis not present

## 2017-09-24 DIAGNOSIS — T502X5A Adverse effect of carbonic-anhydrase inhibitors, benzothiadiazides and other diuretics, initial encounter: Secondary | ICD-10-CM | POA: Insufficient documentation

## 2017-09-24 DIAGNOSIS — K219 Gastro-esophageal reflux disease without esophagitis: Secondary | ICD-10-CM | POA: Insufficient documentation

## 2017-09-24 DIAGNOSIS — I6782 Cerebral ischemia: Secondary | ICD-10-CM | POA: Diagnosis not present

## 2017-09-24 DIAGNOSIS — I959 Hypotension, unspecified: Secondary | ICD-10-CM | POA: Insufficient documentation

## 2017-09-24 DIAGNOSIS — Z885 Allergy status to narcotic agent status: Secondary | ICD-10-CM | POA: Insufficient documentation

## 2017-09-24 DIAGNOSIS — R55 Syncope and collapse: Principal | ICD-10-CM | POA: Insufficient documentation

## 2017-09-24 DIAGNOSIS — E782 Mixed hyperlipidemia: Secondary | ICD-10-CM

## 2017-09-24 DIAGNOSIS — E1122 Type 2 diabetes mellitus with diabetic chronic kidney disease: Secondary | ICD-10-CM | POA: Insufficient documentation

## 2017-09-24 DIAGNOSIS — Z91040 Latex allergy status: Secondary | ICD-10-CM | POA: Insufficient documentation

## 2017-09-24 DIAGNOSIS — Z888 Allergy status to other drugs, medicaments and biological substances status: Secondary | ICD-10-CM | POA: Insufficient documentation

## 2017-09-24 DIAGNOSIS — M109 Gout, unspecified: Secondary | ICD-10-CM | POA: Diagnosis not present

## 2017-09-24 DIAGNOSIS — Z7902 Long term (current) use of antithrombotics/antiplatelets: Secondary | ICD-10-CM | POA: Insufficient documentation

## 2017-09-24 DIAGNOSIS — E1165 Type 2 diabetes mellitus with hyperglycemia: Secondary | ICD-10-CM | POA: Diagnosis not present

## 2017-09-24 DIAGNOSIS — Z7982 Long term (current) use of aspirin: Secondary | ICD-10-CM | POA: Insufficient documentation

## 2017-09-24 DIAGNOSIS — I130 Hypertensive heart and chronic kidney disease with heart failure and stage 1 through stage 4 chronic kidney disease, or unspecified chronic kidney disease: Secondary | ICD-10-CM | POA: Insufficient documentation

## 2017-09-24 DIAGNOSIS — I5022 Chronic systolic (congestive) heart failure: Secondary | ICD-10-CM

## 2017-09-24 DIAGNOSIS — I480 Paroxysmal atrial fibrillation: Secondary | ICD-10-CM | POA: Insufficient documentation

## 2017-09-24 DIAGNOSIS — I5042 Chronic combined systolic (congestive) and diastolic (congestive) heart failure: Secondary | ICD-10-CM | POA: Diagnosis not present

## 2017-09-24 DIAGNOSIS — Z95810 Presence of automatic (implantable) cardiac defibrillator: Secondary | ICD-10-CM | POA: Diagnosis not present

## 2017-09-24 DIAGNOSIS — I255 Ischemic cardiomyopathy: Secondary | ICD-10-CM | POA: Insufficient documentation

## 2017-09-24 DIAGNOSIS — Z794 Long term (current) use of insulin: Secondary | ICD-10-CM | POA: Insufficient documentation

## 2017-09-24 DIAGNOSIS — R42 Dizziness and giddiness: Secondary | ICD-10-CM | POA: Diagnosis not present

## 2017-09-24 DIAGNOSIS — M1990 Unspecified osteoarthritis, unspecified site: Secondary | ICD-10-CM | POA: Insufficient documentation

## 2017-09-24 DIAGNOSIS — R402441 Other coma, without documented Glasgow coma scale score, or with partial score reported, in the field [EMT or ambulance]: Secondary | ICD-10-CM | POA: Diagnosis not present

## 2017-09-24 DIAGNOSIS — R0689 Other abnormalities of breathing: Secondary | ICD-10-CM | POA: Diagnosis not present

## 2017-09-24 DIAGNOSIS — Z87891 Personal history of nicotine dependence: Secondary | ICD-10-CM | POA: Insufficient documentation

## 2017-09-24 DIAGNOSIS — Z79899 Other long term (current) drug therapy: Secondary | ICD-10-CM | POA: Insufficient documentation

## 2017-09-24 DIAGNOSIS — E119 Type 2 diabetes mellitus without complications: Secondary | ICD-10-CM

## 2017-09-24 DIAGNOSIS — R918 Other nonspecific abnormal finding of lung field: Secondary | ICD-10-CM | POA: Diagnosis not present

## 2017-09-24 DIAGNOSIS — R0789 Other chest pain: Secondary | ICD-10-CM | POA: Diagnosis not present

## 2017-09-24 HISTORY — DX: Disorder of kidney and ureter, unspecified: N28.9

## 2017-09-24 LAB — I-STAT CHEM 8, ED
BUN: 41 mg/dL — AB (ref 6–20)
CREATININE: 2 mg/dL — AB (ref 0.61–1.24)
Calcium, Ion: 1.1 mmol/L — ABNORMAL LOW (ref 1.15–1.40)
Chloride: 95 mmol/L — ABNORMAL LOW (ref 101–111)
Glucose, Bld: 292 mg/dL — ABNORMAL HIGH (ref 65–99)
HCT: 34 % — ABNORMAL LOW (ref 39.0–52.0)
Hemoglobin: 11.6 g/dL — ABNORMAL LOW (ref 13.0–17.0)
Potassium: 4 mmol/L (ref 3.5–5.1)
SODIUM: 133 mmol/L — AB (ref 135–145)
TCO2: 24 mmol/L (ref 22–32)

## 2017-09-24 LAB — CBC
HEMATOCRIT: 34.4 % — AB (ref 39.0–52.0)
HEMOGLOBIN: 11.1 g/dL — AB (ref 13.0–17.0)
MCH: 30.2 pg (ref 26.0–34.0)
MCHC: 32.3 g/dL (ref 30.0–36.0)
MCV: 93.5 fL (ref 78.0–100.0)
Platelets: 228 10*3/uL (ref 150–400)
RBC: 3.68 MIL/uL — AB (ref 4.22–5.81)
RDW: 14.6 % (ref 11.5–15.5)
WBC: 9.3 10*3/uL (ref 4.0–10.5)

## 2017-09-24 LAB — CBC WITH DIFFERENTIAL/PLATELET
Basophils Absolute: 0 10*3/uL (ref 0.0–0.1)
Basophils Relative: 0 %
Eosinophils Absolute: 0.2 10*3/uL (ref 0.0–0.7)
Eosinophils Relative: 3 %
HEMATOCRIT: 35.4 % — AB (ref 39.0–52.0)
Hemoglobin: 11.4 g/dL — ABNORMAL LOW (ref 13.0–17.0)
LYMPHS PCT: 20 %
Lymphs Abs: 1.7 10*3/uL (ref 0.7–4.0)
MCH: 30 pg (ref 26.0–34.0)
MCHC: 32.2 g/dL (ref 30.0–36.0)
MCV: 93.2 fL (ref 78.0–100.0)
MONO ABS: 0.8 10*3/uL (ref 0.1–1.0)
Monocytes Relative: 9 %
NEUTROS ABS: 5.9 10*3/uL (ref 1.7–7.7)
Neutrophils Relative %: 68 %
Platelets: 235 10*3/uL (ref 150–400)
RBC: 3.8 MIL/uL — ABNORMAL LOW (ref 4.22–5.81)
RDW: 14.4 % (ref 11.5–15.5)
WBC: 8.7 10*3/uL (ref 4.0–10.5)

## 2017-09-24 LAB — TYPE AND SCREEN
ABO/RH(D): A POS
ANTIBODY SCREEN: NEGATIVE

## 2017-09-24 LAB — I-STAT TROPONIN, ED: Troponin i, poc: 0.02 ng/mL (ref 0.00–0.08)

## 2017-09-24 LAB — COMPREHENSIVE METABOLIC PANEL
ALT: 19 U/L (ref 17–63)
ANION GAP: 12 (ref 5–15)
AST: 19 U/L (ref 15–41)
Albumin: 3.4 g/dL — ABNORMAL LOW (ref 3.5–5.0)
Alkaline Phosphatase: 80 U/L (ref 38–126)
BILIRUBIN TOTAL: 0.6 mg/dL (ref 0.3–1.2)
BUN: 49 mg/dL — ABNORMAL HIGH (ref 6–20)
CO2: 26 mmol/L (ref 22–32)
Calcium: 9.2 mg/dL (ref 8.9–10.3)
Chloride: 97 mmol/L — ABNORMAL LOW (ref 101–111)
Creatinine, Ser: 2.15 mg/dL — ABNORMAL HIGH (ref 0.61–1.24)
GFR calc Af Amer: 33 mL/min — ABNORMAL LOW (ref >60)
GFR, EST NON AFRICAN AMERICAN: 28 mL/min — AB (ref >60)
Glucose, Bld: 304 mg/dL — ABNORMAL HIGH (ref 65–99)
POTASSIUM: 4.2 mmol/L (ref 3.5–5.1)
Sodium: 135 mmol/L (ref 135–145)
TOTAL PROTEIN: 7 g/dL (ref 6.5–8.1)

## 2017-09-24 LAB — TROPONIN I
Troponin I: <0.03 ng/mL (ref <0.03)
Troponin I: <0.03 ng/mL (ref <0.03)
Troponin I: <0.03 ng/mL (ref <0.03)

## 2017-09-24 LAB — URINALYSIS, COMPLETE (UACMP) WITH MICROSCOPIC
Bacteria, UA: NONE SEEN
Bilirubin Urine: NEGATIVE
GLUCOSE, UA: NEGATIVE mg/dL
Hgb urine dipstick: NEGATIVE
Ketones, ur: NEGATIVE mg/dL
Nitrite: NEGATIVE
PROTEIN: NEGATIVE mg/dL
SPECIFIC GRAVITY, URINE: 1.012 (ref 1.005–1.030)
pH: 5 (ref 5.0–8.0)

## 2017-09-24 LAB — I-STAT CG4 LACTIC ACID, ED
LACTIC ACID, VENOUS: 1.31 mmol/L (ref 0.5–1.9)
Lactic Acid, Venous: 1.94 mmol/L — ABNORMAL HIGH (ref 0.5–1.9)

## 2017-09-24 LAB — CBG MONITORING, ED
GLUCOSE-CAPILLARY: 233 mg/dL — AB (ref 65–99)
GLUCOSE-CAPILLARY: 214 mg/dL — AB (ref 65–99)

## 2017-09-24 LAB — PROTIME-INR
INR: 1.09
Prothrombin Time: 14 seconds (ref 11.4–15.2)

## 2017-09-24 LAB — MRSA PCR SCREENING: MRSA BY PCR: NEGATIVE

## 2017-09-24 LAB — GLUCOSE, CAPILLARY
GLUCOSE-CAPILLARY: 177 mg/dL — AB (ref 65–99)
Glucose-Capillary: 160 mg/dL — ABNORMAL HIGH (ref 65–99)

## 2017-09-24 LAB — HEMOGLOBIN A1C
HEMOGLOBIN A1C: 10.2 % — AB (ref 4.8–5.6)
Mean Plasma Glucose: 246.04 mg/dL

## 2017-09-24 LAB — POC OCCULT BLOOD, ED: Fecal Occult Bld: NEGATIVE

## 2017-09-24 LAB — BRAIN NATRIURETIC PEPTIDE: B Natriuretic Peptide: 93 pg/mL (ref 0.0–100.0)

## 2017-09-24 MED ORDER — RANOLAZINE ER 500 MG PO TB12
500.0000 mg | ORAL_TABLET | Freq: Two times a day (BID) | ORAL | Status: DC
  Administered 2017-09-24 – 2017-09-25 (×2): 500 mg via ORAL
  Filled 2017-09-24 (×2): qty 1

## 2017-09-24 MED ORDER — ASPIRIN EC 81 MG PO TBEC
81.0000 mg | DELAYED_RELEASE_TABLET | Freq: Every day | ORAL | Status: DC
  Administered 2017-09-24 – 2017-09-25 (×2): 81 mg via ORAL
  Filled 2017-09-24 (×2): qty 1

## 2017-09-24 MED ORDER — ONDANSETRON HCL 4 MG/2ML IJ SOLN: 4.0000 mg | Freq: Four times a day (QID) | INTRAMUSCULAR | Status: DC | PRN

## 2017-09-24 MED ORDER — INSULIN ASPART 100 UNIT/ML ~~LOC~~ SOLN
0.0000 [IU] | Freq: Three times a day (TID) | SUBCUTANEOUS | Status: DC
  Administered 2017-09-24 (×2): 5 [IU] via SUBCUTANEOUS
  Administered 2017-09-24 – 2017-09-25 (×2): 3 [IU] via SUBCUTANEOUS
  Filled 2017-09-24 (×2): qty 1

## 2017-09-24 MED ORDER — CLOPIDOGREL BISULFATE 75 MG PO TABS
75.0000 mg | ORAL_TABLET | Freq: Every day | ORAL | Status: DC
  Administered 2017-09-24 – 2017-09-25 (×2): 75 mg via ORAL
  Filled 2017-09-24 (×2): qty 1

## 2017-09-24 MED ORDER — ACETAMINOPHEN 325 MG PO TABS
650.0000 mg | ORAL_TABLET | Freq: Four times a day (QID) | ORAL | Status: DC | PRN
  Administered 2017-09-25: 650 mg via ORAL
  Filled 2017-09-24: qty 2

## 2017-09-24 MED ORDER — ALLOPURINOL 100 MG PO TABS
100.0000 mg | ORAL_TABLET | Freq: Every day | ORAL | Status: DC
  Administered 2017-09-24 – 2017-09-25 (×2): 100 mg via ORAL
  Filled 2017-09-24 (×2): qty 1

## 2017-09-24 MED ORDER — ENOXAPARIN SODIUM 30 MG/0.3ML ~~LOC~~ SOLN
30.0000 mg | SUBCUTANEOUS | Status: DC
  Administered 2017-09-24: 30 mg via SUBCUTANEOUS
  Filled 2017-09-24: qty 0.3

## 2017-09-24 MED ORDER — ACETAMINOPHEN 650 MG RE SUPP: 650.0000 mg | Freq: Four times a day (QID) | RECTAL | Status: DC | PRN

## 2017-09-24 MED ORDER — TECHNETIUM TO 99M ALBUMIN AGGREGATED
4.0000 | Freq: Once | INTRAVENOUS | Status: AC | PRN
  Administered 2017-09-24: 4.4 via INTRAVENOUS

## 2017-09-24 MED ORDER — SODIUM CHLORIDE 0.9 % IV BOLUS
500.0000 mL | Freq: Once | INTRAVENOUS | Status: AC
  Administered 2017-09-24: 500 mL via INTRAVENOUS

## 2017-09-24 MED ORDER — TECHNETIUM TC 99M DIETHYLENETRIAME-PENTAACETIC ACID
30.0000 | Freq: Once | INTRAVENOUS | Status: AC | PRN
  Administered 2017-09-24: 31 via RESPIRATORY_TRACT

## 2017-09-24 MED ORDER — INSULIN GLARGINE 100 UNIT/ML ~~LOC~~ SOLN
30.0000 [IU] | Freq: Every day | SUBCUTANEOUS | Status: DC
  Administered 2017-09-24 – 2017-09-25 (×2): 30 [IU] via SUBCUTANEOUS
  Filled 2017-09-24 (×3): qty 0.3

## 2017-09-24 MED ORDER — SODIUM CHLORIDE 0.9 % IV SOLN
INTRAVENOUS | Status: DC
  Administered 2017-09-24: 04:00:00 via INTRAVENOUS

## 2017-09-24 MED ORDER — VENLAFAXINE HCL ER 75 MG PO CP24
150.0000 mg | ORAL_CAPSULE | Freq: Every day | ORAL | Status: DC
  Administered 2017-09-24: 150 mg via ORAL
  Filled 2017-09-24: qty 2

## 2017-09-24 MED ORDER — SODIUM CHLORIDE 0.9% FLUSH
3.0000 mL | Freq: Two times a day (BID) | INTRAVENOUS | Status: DC
  Administered 2017-09-25: 3 mL via INTRAVENOUS

## 2017-09-24 MED ORDER — ATORVASTATIN CALCIUM 40 MG PO TABS
80.0000 mg | ORAL_TABLET | Freq: Every day | ORAL | Status: DC
  Administered 2017-09-24: 80 mg via ORAL
  Filled 2017-09-24: qty 2

## 2017-09-24 MED ORDER — ONDANSETRON HCL 4 MG PO TABS: 4.0000 mg | ORAL_TABLET | Freq: Four times a day (QID) | ORAL | Status: DC | PRN

## 2017-09-24 MED ORDER — SODIUM CHLORIDE 0.9 % IV BOLUS
1000.0000 mL | Freq: Once | INTRAVENOUS | Status: AC
  Administered 2017-09-24: 1000 mL via INTRAVENOUS

## 2017-09-24 MED ORDER — SODIUM CHLORIDE 0.9 % IV SOLN
1.0000 g | INTRAVENOUS | Status: DC
  Administered 2017-09-24: 1 g via INTRAVENOUS
  Filled 2017-09-24 (×3): qty 10

## 2017-09-24 NOTE — ED Notes (Signed)
Pt placed on hospital bed

## 2017-09-24 NOTE — ED Notes (Signed)
Pt transported to xray 

## 2017-09-24 NOTE — ED Triage Notes (Signed)
Pt c/o chest pain and wife gave pt one nitro. His pressure was low when ems arrived.

## 2017-09-24 NOTE — Progress Notes (Signed)
PROGRESS NOTE  [name] ZOX:[id number] DOB: November 30, 1940 DOA: 09/24/2017 PCP: Mayra Neer, MD  Brief History:  77 year old male with a history of coronary artery disease status post CABG, ischemic cardia myopathy, chronic systolic and diastolic CHF, diabetes mellitus, hyperlipidemia, paroxysmal atrial fibrillation, hypertension, CKD stage III presenting with a presyncopal episode.  The patient was getting up to go to the bathroom on the evening of 09/23/2017 when he experienced dizziness.  The patient fell to the ground, but did not completely lose consciousness.  His wife came to help him up and EMS was activated.  According to the patient, he has been feeling some generalized weakness and malaise for the past 2 days prior to this admission.  His wife has been taking his blood pressure, and she states that his systolic pressure has been running in the 80s at home.  The patient states that he has increased his furosemide to 80 mg twice daily.  Review of the medical record shows that his cardiologist, Dr. Algernon Huxley instructed the patient to increase his furosemide on 08/30/2017.  The patient denied any chest discomfort, shortness breath, nausea, vomiting, diarrhea, abdominal pain, dysuria, hematuria, headache, neck pain.  He states that he has been eating well otherwise.  His CBGs have been under good control.  He denies any AICD shocks.  In the emergency department, the patient was afebrile with soft blood pressures in the upper 80s and low 90s.  He was saturating 9690% on room air.  The patient was admitted for further evaluation and observation.  Notably, the patient received 1500 cc of fluid in the emergency department.  Assessment/Plan: Presyncope/dizziness -Likely secondary to hypotension, likely related to his recent increase in furosemide dosing -07/11/2017 echo EF 30-35%, grade 1 DD, mild MR, inferolateral AK -Check orthostatics -UA and urine culture -Cardiology  evaluation  Chronic systolic CHF/ischemic cardia myopathy -07/11/2017 Echo--as discussed above -The patient appears clinically euvolemic -Daily weights -Holding furosemide dosing for today -Continue carvedilol at lower dose secondary to hypotension -Holding losartan secondary to hypotension -Continue aspirin, Lipitor, Plavix, Ranexa -Holding eplerenone -d/c IVF  Diabetes mellitus type 2 -Check hemoglobin A1c -Reduced dose Lantus -NovoLog sliding scale  Acute on chronic renal failure --CKD stage III -Secondary to volume depletion -Baseline creatinine 1.3-1.6 -Presenting creatinine 2.15 -Hold losartan -Hold furosemide and eplerenone  Coronary artery disease with history of CABG -No chest pain presently -Personally reviewed EKG--paced rhythm -Chest x-ray--low volumes, increased interstitial markings  Hyperlipidemia -Continue statin    Disposition Plan:   Home 6/11 if cleared by cardiology Family Communication:  Spouse updated at bedside --Total time spent 35 minutes.  Greater than 50% spent face to face counseling and coordinating care. 0730 to 0805   Consultants:  cardiology  Code Status:  FUL  DVT Prophylaxis:  [place] Lovenox   Procedures: As Listed in Progress Note Above  Antibiotics: None    Subjective: Patient denies fevers, chills, headache, chest pain, dyspnea, nausea, vomiting, diarrhea, abdominal pain, dysuria, hematuria, hematochezia, and melena.   Objective: Vitals:   09/24/17 0600 09/24/17 0630 09/24/17 0700 09/24/17 0730  BP: (!) 93/50 (!) 97/58 (!) 95/52 (!) 97/52  Pulse: 66 67 68 65  Resp: 11 19 13 18   Temp:      TempSrc:      SpO2: 96% 94% 94% 95%  Weight:        Intake/Output Summary (Last 24 hours) at 09/24/2017 0756 Last data filed at 09/24/2017 0343 Gross per 24  hour  Intake 1500 ml  Output -  Net 1500 ml   Weight change:  Exam:   General:  Pt is alert, follows commands appropriately, not in acute distress  HEENT: No  icterus, No thrush, No neck mass, [place]/AT  Cardiovascular: RRR, S1/S2, no rubs, no gallops  Respiratory: CTA bilaterally, no wheezing, no crackles, no rhonchi  Abdomen: Soft/+BS, non tender, non distended, no guarding  Extremities: No edema, No lymphangitis, No petechiae, No rashes, no synovitis   Data Reviewed: I have personally reviewed following labs and imaging studies Basic Metabolic Panel: Recent Labs  Lab 09/24/17 0156 09/24/17 0157  NA 133* 135  K 4.0 4.2  CL 95* 97*  CO2  --  26  GLUCOSE 292* 304*  BUN 41* 49*  CREATININE 2.00* 2.15*  CALCIUM  --  9.2   Liver Function Tests: Recent Labs  Lab 09/24/17 0157  AST 19  ALT 19  ALKPHOS 80  BILITOT 0.6  PROT 7.0  ALBUMIN 3.4*   No results for input(s): LIPASE, AMYLASE in the last 168 hours. No results for input(s): AMMONIA in the last 168 hours. Coagulation Profile: Recent Labs  Lab 09/24/17 0157  INR 1.09   CBC: Recent Labs  Lab 09/24/17 0156 09/24/17 0157  WBC  --  8.7  NEUTROABS  --  5.9  HGB 11.6* 11.4*  HCT 34.0* 35.4*  MCV  --  93.2  PLT  --  235   Cardiac Enzymes: Recent Labs  Lab 09/24/17 0157  TROPONINI <0.03   BNP: Invalid input(s): POCBNP CBG: No results for input(s): GLUCAP in the last 168 hours. HbA1C: No results for input(s): HGBA1C in the last 72 hours. Urine analysis:    Component Value Date/Time   COLORURINE STRAW (A) 05/01/2017 1505   APPEARANCEUR CLEAR 05/01/2017 1505   LABSPEC 1.009 05/01/2017 1505   PHURINE 5.0 05/01/2017 1505   GLUCOSEU 50 (A) 05/01/2017 1505   HGBUR NEGATIVE 05/01/2017 1505   BILIRUBINUR NEGATIVE 05/01/2017 1505   KETONESUR NEGATIVE 05/01/2017 1505   PROTEINUR NEGATIVE 05/01/2017 1505   UROBILINOGEN 2.0 (H) 12/23/2011 2145   NITRITE NEGATIVE 05/01/2017 1505   LEUKOCYTESUR NEGATIVE 05/01/2017 1505   Sepsis Labs: @LABRCNTIP (procalcitonin:4,lacticidven:4) )No results found for this or any previous visit (from the past 240 hour(s)).    Scheduled Meds: . enoxaparin (LOVENOX) injection  30 mg Subcutaneous Q24H  . insulin aspart  0-15 Units Subcutaneous TID WC  . sodium chloride flush  3 mL Intravenous Q12H   Continuous Infusions: . sodium chloride 100 mL/hr at 09/24/17 0416    Procedures/Studies: Ct Head Wo Contrast  Result Date: 09/24/2017 CLINICAL DATA:  Altered level consciousness. Patient reports syncope. EXAM: CT HEAD WITHOUT CONTRAST TECHNIQUE: Contiguous axial images were obtained from the base of the skull through the vertex without intravenous contrast. COMPARISON:  Head CT 05/01/2017 FINDINGS: Brain: Stable degree of atrophy and chronic small vessel ischemia. No intracranial hemorrhage, mass effect, or midline shift. No hydrocephalus. The basilar cisterns are patent. No evidence of territorial infarct or acute ischemia. No extra-axial or intracranial fluid collection. Vascular: Atherosclerosis of skullbase vasculature without hyperdense vessel or abnormal calcification. Skull: No fracture or focal lesion. Sinuses/Orbits: Prior mucosal thickening of left sphenoid sinus has resolved. Minimal opacification of lower right mastoid air cells. Bilateral cataract resection. Other: None. IMPRESSION: 1.  No acute intracranial abnormality. 2. Atrophy and chronic small vessel ischemia, unchanged from prior. Electronically Signed   By: Jeb Levering M.D.   On: 09/24/2017 03:19   Dg  Chest Portable 1 View  Result Date: 09/24/2017 CLINICAL DATA:  Syncope. EXAM: PORTABLE CHEST 1 VIEW COMPARISON:  05/11/2017 FINDINGS: Lung volumes are low. Left-sided pacemaker in place. Patient is post median sternotomy. Cardiomegaly is similar to prior exam. Bronchovascular crowding versus vascular congestion. Limited assessment of the left lung base due to soft tissue attenuation in the lung volumes. No right pleural effusion. No pneumothorax. IMPRESSION: Hypoventilatory chest. Similar cardiomegaly. Vascular congestion versus bronchovascular  crowding. Electronically Signed   By: Jeb Levering M.D.   On: 09/24/2017 02:27    Orson Eva, DO  [facility] Pager [phone]  If 7PM-7AM, please contact night-coverage [email or web address] Password [email or web address] 09/24/2017, 7:56 AM   LOS: 0 days

## 2017-09-24 NOTE — ED Notes (Signed)
Pt assisted on bedpan but had no results

## 2017-09-24 NOTE — ED Notes (Signed)
Hospitalist at bedside 

## 2017-09-24 NOTE — Care Management Note (Signed)
Case Management Note  Patient Details  Name: [name] MRN: [id number] Date of Birth: 1941-04-14  Subjective/Objective:    Syncope. From home with wife, ind. No Home health. Does not use any assistive devices. Has PCP, still drives. Has insurance with prescription coverage.                Action/Plan: DC home.   Expected Discharge Date:    09/25/2017              Expected Discharge Plan:     In-House Referral:     Discharge planning Services     Post Acute Care Choice:    Choice offered to:     DME Arranged:    DME Agency:     HH Arranged:    HH Agency:     Status of Service:     If discussed at [employer], dates discussed:    Additional Comments:  Nisa Decaire, Chauncey Reading, RN 09/24/2017, 3:16 PM

## 2017-09-24 NOTE — ED Notes (Signed)
Per pt he had syncopal episode at home in bathroom with wife.

## 2017-09-24 NOTE — ED Provider Notes (Signed)
[facility] EMERGENCY DEPARTMENT Provider Note   CSN: [id number] Arrival date & time: 09/24/17  0138     History   Chief Complaint Chief Complaint  Patient presents with  . Chest Pain    HPI [name] is a 77 y.o. male.  Patient with history of CAD status post CABG and ischemic heart myopathy as well as diabetes presenting with syncopal episode by EMS.  Patient states he was doing well and went up to go to the bathroom from a sitting position when he began to feel dizzy and lightheaded and passed out landing on his side.  Denies hitting his head or losing consciousness.  After passing out, he states he developed chest pressure for which his wife gave him one nitroglycerin.  EMS was called.  EMS reports he has been awake and alert but his initial blood pressure was 50 systolic.  He was given 350 cc of fluid by EMS.  He denies any further chest pressure.  Denies any head, neck, back, chest or abdominal pain.  No recent illness, vomiting or diarrhea.  Denies any blood thinner use but does take Plavix.  Denies any AICD shocks but states the battery has been low.  He denies any chest pain or shortness of breath currently.  Initial blood pressure on arrival is 99 systolic.  He has never passed out before.  The history is provided by the patient.  Chest Pain   Associated symptoms include dizziness, shortness of breath and weakness. Pertinent negatives include no abdominal pain, no fever, no nausea and no vomiting.  Pertinent negatives for past medical history include no seizures.    Past Medical History:  Diagnosis Date  . CAD (coronary artery disease)    s/p CABG 1997, PCI (BMS) of SVG to RCA 3/09, redo CABG 02/2011 with SVG to PDA, SVG to Lcx, s/p cath 2.2013 with ocluded SVT to left circ and patent SVG to RCA.  Cath 07/2013 Recent cath showed ischemic cardiomyopathy with LVEF less than 20%, with progressive LV dysfunction related to bypass graft failure, occlusion of the saphenous vein  grafts placed in 2012 to the right coronary and to the circum  . CHF (congestive heart failure) ([place])   . Chronic systolic dysfunction of left ventricle    EF 30%  . Depression   . DJD (degenerative joint disease)   . DM (diabetes mellitus) ([place]) 02/14/2011  . GERD (gastroesophageal reflux disease)   . Gout   . HTN (hypertension) 02/14/2011  . Hyperlipemia   . Ischemic cardiomyopathy    s/p ICD Implantation by Dr Leonia Reeves  . Paroxysmal atrial fibrillation ([place]) 10/22/2014   single episode of AF x 3 hours 48 minutes recorded on ICD, chads2vasc score is at least 5   . Renal disorder     Patient Active Problem List   Diagnosis Date Noted  . Acute kidney injury ([place]) 05/01/2017  . Confusion 05/01/2017  . NSTEMI (non-ST elevated myocardial infarction) ([place])   . Unstable angina ([place])   . Chest pain with high risk of acute coronary syndrome 08/01/2015  . CKD (chronic kidney disease) stage 3, GFR 30-59 ml/min (HCC) 06/14/2015  . Angina pectoris ([place]) 02/17/2015  . Paroxysmal atrial fibrillation ([place]) 01/11/2015  . Hypersomnia 04/09/2013  . Chronic systolic CHF (congestive heart failure) ([place]) 12/12/2012  . ICD-Medtronic 02/27/2012  . Chronic cholecystitis with calculus 01/02/2012  . Nausea vomiting and diarrhea 12/24/2011  . Dizziness 12/24/2011  . Preop cardiovascular exam 12/24/2011  . CAD (coronary artery  disease) 02/14/2011  . HTN (hypertension) 02/14/2011  . DM (diabetes mellitus) ([place]) 02/14/2011  . Ischemic cardiomyopathy 10/14/2010  . Hyperlipemia 10/14/2010    Past Surgical History:  Procedure Laterality Date  . BI-VENTRICULAR IMPLANTABLE CARDIOVERTER DEFIBRILLATOR UPGRADE N/A 08/24/2011   Procedure: BI-VENTRICULAR IMPLANTABLE CARDIOVERTER DEFIBRILLATOR UPGRADE;  Surgeon: Evans Lance, MD;  Location: [facility] CATH LAB;  Service: Cardiovascular;  Laterality: N/A;  . CARDIAC CATHETERIZATION  08/03/2015   Procedure: Left Heart Cath and Cors/Grafts Angiography;  Surgeon: Peter M  Martinique, MD;  Location: [place] CV LAB;  Service: Cardiovascular;;  . CARDIAC CATHETERIZATION N/A 08/06/2015   Procedure: Coronary Stent Intervention w/Impella;  Surgeon: Jettie Booze, MD;  Location: [place] CV LAB;  Service: Cardiovascular;  Laterality: N/A;  . CARDIAC CATHETERIZATION  08/06/2015   Procedure: Left Heart Cath;  Surgeon: Jettie Booze, MD;  Location: [place] CV LAB;  Service: Cardiovascular;;  . CARDIAC CATHETERIZATION  08/06/2015   Procedure: Coronary Balloon Angioplasty;  Surgeon: Jettie Booze, MD;  Location: [place] CV LAB;  Service: Cardiovascular;;  . CARDIAC DEFIBRILLATOR PLACEMENT  08/2004   initial placement, upgraded to [place] ICD by Dr Lovena Le 08/24/11 (MDT)  . CATARACT EXTRACTION W/ INTRAOCULAR LENS  IMPLANT, BILATERAL  2012  . CHOLECYSTECTOMY  01/05/2012   Procedure: LAPAROSCOPIC CHOLECYSTECTOMY WITH INTRAOPERATIVE CHOLANGIOGRAM;  Surgeon: Joyice Faster. Cornett, MD;  Location: [place];  Service: General;  Laterality: N/A;  laparoscopic cholecysectoym with intraoperative cholangiogram  . COLONOSCOPY WITH PROPOFOL N/A 11/18/2013   Procedure: COLONOSCOPY WITH PROPOFOL;  Surgeon: Garlan Fair, MD;  Location: [facility] ENDOSCOPY;  Service: Endoscopy;  Laterality: N/A;  . CORONARY ANGIOPLASTY WITH STENT PLACEMENT  06/2007   BMS to SVG to RCA  . CORONARY ARTERY BYPASS GRAFT  01/1996   CABG x 5 LIMA to LAD SVG to diag1,2,svg to om,svg to RCA  . CORONARY ARTERY BYPASS GRAFT  03/06/2011   CABG X2; Procedure: REDO CORONARY ARTERY BYPASS GRAFTING (CABG);  Surgeon: Grace Isaac, MD;  Location: [place];  Service: Open Heart Surgery;  Laterality: N/A;  times two grafts using right saphenous vein harvested endoscopically.  Marland Kitchen KNEE ARTHROTOMY  ~ 1978   RIGHT KNEE CARTILAGE REMOVED  . LEFT HEART CATHETERIZATION WITH CORONARY ANGIOGRAM N/A 06/06/2011   Procedure: LEFT HEART CATHETERIZATION WITH CORONARY ANGIOGRAM;  Surgeon: Sueanne Margarita, MD;  Location: [place] CATH LAB;   Service: Cardiovascular;  Laterality: N/A;  . LEFT HEART CATHETERIZATION WITH CORONARY/GRAFT ANGIOGRAM N/A 08/08/2013   Procedure: LEFT HEART CATHETERIZATION WITH Beatrix Fetters;  Surgeon: Sinclair Grooms, MD;  Location: [facility] CATH LAB;  Service: Cardiovascular;  Laterality: N/A;  . LUMBAR Enigma SURGERY  2003  . RIGHT HEART CATHETERIZATION N/A 02/17/2014   Procedure: RIGHT HEART CATH;  Surgeon: Larey Dresser, MD;  Location: [facility] CATH LAB;  Service: Cardiovascular;  Laterality: N/A;  . VENOGRAM N/A 06/08/2011   Procedure: VENOGRAM;  Surgeon: Thompson Grayer, MD;  Location: [facility] CATH LAB;  Service: Cardiovascular;  Laterality: N/A;        Home Medications    Prior to Admission medications   Medication Sig Start Date End Date Taking? Authorizing Provider  allopurinol (ZYLOPRIM) 100 MG tablet Take 100 mg by mouth daily.    Yes [provider]  aspirin EC 81 MG tablet Take 81 mg by mouth daily.   Yes [provider]  atorvastatin (LIPITOR) 80 MG tablet TAKE 1 TABLET BY MOUTH DAILY AT 6:00 PM 05/11/17  Yes Larey Dresser, MD  carvedilol (COREG) 25 MG tablet TAKE 1/2 (ONE-HALF) TABLET BY MOUTH TWICE DAILY WITH A MEAL 09/20/17  Yes Larey Dresser, MD  clopidogrel (PLAVIX) 75 MG tablet Take 1 tablet (75 mg total) by mouth daily. 02/03/14  Yes Allred, Jeneen Rinks, MD  eplerenone (INSPRA) 25 MG tablet Take 1 tablet (25 mg total) by mouth daily. 05/11/17  Yes Larey Dresser, MD  furosemide (LASIX) 40 MG tablet Take 2 tablets (80 mg total) by mouth two times a day. 09/03/17  Yes Larey Dresser, MD  Insulin Glargine (TOUJEO SOLOSTAR) 300 UNIT/ML SOPN Inject 30 Units into the skin daily.   Yes [provider]  insulin regular human CONCENTRATED (HUMULIN R U-500 KWIKPEN) 500 UNIT/ML kwikpen 30 units before BFST, 20 at lunch and 35 before supper, take 30-60 minutes before each meal 02/04/16  Yes Elayne Snare, MD  losartan (COZAAR) 25 MG tablet Take 0.5 tablets (12.5 mg total) by mouth  daily. 07/11/17 10/09/17 Yes Larey Dresser, MD  nitroGLYCERIN (NITROSTAT) 0.4 MG SL tablet DISSOLVE ONE TABLET UNDER THE TONGUE EVERY 5 MINUTES AS NEEDED FOR CHEST PAIN.  DO NOT EXCEED A TOTAL OF 3 DOSES IN 15 MINUTES 07/24/17  Yes Larey Dresser, MD  ONE TOUCH ULTRA TEST test strip Use to check blood sugar 3 times per day. 02/04/16  Yes Elayne Snare, MD  [facility] DELICA LANCETS 47W MISC Use to check blood sugar 3 times per day dx code E11.9 02/04/16  Yes Elayne Snare, MD  ranolazine (RANEXA) 1000 MG SR tablet Take 500 mg by mouth 2 (two) times daily.   Yes [provider]  venlafaxine (EFFEXOR-XR) 150 MG 24 hr capsule Take 150 mg by mouth at bedtime.    Yes [provider]  Insulin Lispro Prot & Lispro (HUMALOG MIX 50/50 KWIKPEN) (50-50) 100 UNIT/ML Kwikpen 15 units at breakfast and lunch, 30 units before supper.  Take insulin 10 minutes before eating 11/05/15   Elayne Snare, MD  Insulin Pen Needle (RELION PEN NEEDLES) 32G X 4 MM MISC Use to inject insulin 3 times per day. 02/04/16   Elayne Snare, MD  pantoprazole (PROTONIX) 40 MG tablet TAKE ONE TABLET BY MOUTH ONCE DAILY 07/11/16   Bensimhon, Shaune Pascal, MD    Family History Family History  Problem Relation Age of Onset  . Heart failure Mother   . Heart attack Mother   . Heart failure Father   . Heart attack Father   . Coronary artery disease Unknown   . Sudden Cardiac Death Brother        in his 19's  . Heart attack Brother   . Cancer Brother     Social History Social History   Tobacco Use  . Smoking status: Former Smoker    Packs/day: 0.50    Years: 15.00    Pack years: 7.50    Types: Cigarettes    Last attempt to quit: 04/17/1969    Years since quitting: 48.4  . Smokeless tobacco: Former Systems developer    Quit date: 05/05/1971  Substance Use Topics  . Alcohol use: Yes    Alcohol/week: 0.6 oz    Types: 1 Cans of beer per week    Comment: occasional  . Drug use: No     Allergies   Codeine and Latex   Review of  Systems Review of Systems  Constitutional: Negative for activity change, appetite change and fever.  HENT: Negative for congestion and rhinorrhea.   Respiratory: Positive for chest tightness and shortness of  breath.   Cardiovascular: Positive for chest pain.  Gastrointestinal: Negative for abdominal pain, nausea and vomiting.  Genitourinary: Negative for dysuria and hematuria.  Musculoskeletal: Negative for arthralgias and myalgias.  Skin: Negative for rash.  Neurological: Positive for dizziness, syncope, weakness and light-headedness. Negative for seizures.   all other systems are negative except as noted in the HPI and PMH.     Physical Exam Updated Vital Signs BP (!) 85/53   Pulse 68   Resp 16   Wt 88.5 kg (195 lb)   SpO2 95%   BMI 27.98 kg/m   Physical Exam  Constitutional: He is oriented to person, place, and time. He appears well-developed and well-nourished. No distress.  Awake and alert  HENT:  Head: Normocephalic and atraumatic.  Mouth/Throat: Oropharynx is clear and moist. No oropharyngeal exudate.  Eyes: Pupils are equal, round, and reactive to light. Conjunctivae and EOM are normal.  Neck: Normal range of motion. Neck supple.  No meningismus.  Cardiovascular: Normal rate, regular rhythm, normal heart sounds and intact distal pulses.  No murmur heard. Pulmonary/Chest: Effort normal and breath sounds normal. No respiratory distress. He exhibits no tenderness.  AICD L chest  Abdominal: Soft. There is no tenderness. There is no rebound and no guarding.  Genitourinary:  Genitourinary Comments: Brown stool, no gross blood  Musculoskeletal: Normal range of motion. He exhibits no edema or tenderness.  Neurological: He is alert and oriented to person, place, and time. No cranial nerve deficit. He exhibits normal muscle tone. Coordination normal.  No ataxia on finger to nose bilaterally. No pronator drift. 5/5 strength throughout. CN 2-12 intact.Equal grip strength.  Sensation intact.   Skin: Skin is warm. Capillary refill takes less than 2 seconds. No rash noted.  Psychiatric: He has a normal mood and affect. His behavior is normal.  Nursing note and vitals reviewed.    ED Treatments / Results  Labs (all labs ordered are listed, but only abnormal results are displayed) Labs Reviewed  CBC WITH DIFFERENTIAL/PLATELET - Abnormal; Notable for the following components:      Result Value   RBC 3.80 (*)    Hemoglobin 11.4 (*)    HCT 35.4 (*)    All other components within normal limits  COMPREHENSIVE METABOLIC PANEL - Abnormal; Notable for the following components:   Chloride 97 (*)    Glucose, Bld 304 (*)    BUN 49 (*)    Creatinine, Ser 2.15 (*)    Albumin 3.4 (*)    GFR calc non Af Amer 28 (*)    GFR calc Af Amer 33 (*)    All other components within normal limits  I-STAT CG4 LACTIC ACID, ED - Abnormal; Notable for the following components:   Lactic Acid, Venous 1.94 (*)    All other components within normal limits  I-STAT CHEM 8, ED - Abnormal; Notable for the following components:   Sodium 133 (*)    Chloride 95 (*)    BUN 41 (*)    Creatinine, Ser 2.00 (*)    Glucose, Bld 292 (*)    Calcium, Ion 1.10 (*)    Hemoglobin 11.6 (*)    HCT 34.0 (*)    All other components within normal limits  TROPONIN I  BRAIN NATRIURETIC PEPTIDE  PROTIME-INR  I-STAT TROPONIN, ED  POC OCCULT BLOOD, ED  I-STAT CG4 LACTIC ACID, ED  TYPE AND SCREEN    EKG EKG Interpretation  Date/Time:  Monday September 24 2017 01:46:49 EDT Ventricular Rate:  75 PR Interval:    QRS Duration: 143 QT Interval:  476 QTC Calculation: 532 R Axis:   -148 Text Interpretation:  Ventricular-paced rhythm No further analysis attempted due to paced rhythm No significant change was found Confirmed by Ezequiel Essex [phone]) on 09/24/2017 1:59:46 AM   Radiology Ct Head Wo Contrast  Result Date: 09/24/2017 CLINICAL DATA:  Altered level consciousness. Patient reports syncope.  EXAM: CT HEAD WITHOUT CONTRAST TECHNIQUE: Contiguous axial images were obtained from the base of the skull through the vertex without intravenous contrast. COMPARISON:  Head CT 05/01/2017 FINDINGS: Brain: Stable degree of atrophy and chronic small vessel ischemia. No intracranial hemorrhage, mass effect, or midline shift. No hydrocephalus. The basilar cisterns are patent. No evidence of territorial infarct or acute ischemia. No extra-axial or intracranial fluid collection. Vascular: Atherosclerosis of skullbase vasculature without hyperdense vessel or abnormal calcification. Skull: No fracture or focal lesion. Sinuses/Orbits: Prior mucosal thickening of left sphenoid sinus has resolved. Minimal opacification of lower right mastoid air cells. Bilateral cataract resection. Other: None. IMPRESSION: 1.  No acute intracranial abnormality. 2. Atrophy and chronic small vessel ischemia, unchanged from prior. Electronically Signed   By: Jeb Levering M.D.   On: 09/24/2017 03:19   Dg Chest Portable 1 View  Result Date: 09/24/2017 CLINICAL DATA:  Syncope. EXAM: PORTABLE CHEST 1 VIEW COMPARISON:  05/11/2017 FINDINGS: Lung volumes are low. Left-sided pacemaker in place. Patient is post median sternotomy. Cardiomegaly is similar to prior exam. Bronchovascular crowding versus vascular congestion. Limited assessment of the left lung base due to soft tissue attenuation in the lung volumes. No right pleural effusion. No pneumothorax. IMPRESSION: Hypoventilatory chest. Similar cardiomegaly. Vascular congestion versus bronchovascular crowding. Electronically Signed   By: Jeb Levering M.D.   On: 09/24/2017 02:27    Procedures Procedures (including critical care time)  Medications Ordered in ED Medications  sodium chloride 0.9 % bolus 1,000 mL (has no administration in time range)     Initial Impression / Assessment and Plan / ED Course  I have reviewed the triage vital signs and the nursing notes.  Pertinent  labs & imaging results that were available during my care of the patient were reviewed by me and considered in my medical decision making (see chart for details).    Patient with syncopal episode followed by chest pain.  Developed hypotension after receiving nitroglycerin.  He denies chest pressure currently.  He is neurologically intact.  EKG is paced without acute ST changes.  EF is 30 to 35%.  Patient will be gently hydrated.  Bedside echocardiogram does not show any obvious right heart strain.  He denies chest pain or shortness of breath  AICD interrogated.  No device discharges.  Patient has had no arrhythmias since May 8 when he had a brief episode of atrial tachycardia.  No recent arrhythmias since May 8.  Creatinine elevated at 2 which precludes CT angiogram of chest.  Patient remains hypotensive in the 80s after 1 L of fluid.  EF is 30 to 35%.  Will gently hydrate further.  Patient did have his Lasix increased to 80 mg twice daily on May 16.  He states his blood pressure has been running in the 469 systolic range since.  Patient did have a prodrome today of dizziness and lightheadedness before his passing out episode.  Hemoglobin is stable.  Hemoccult is negative.  Patient continues to rest comfortably with soft blood pressures in the 62-95 systolic range.  He denies any chest pain or shortness of breath.  He will be given additional 500 cc of fluid.  Suspect he is overly diuresed due to his large Lasix dose.  Pulmonary embolism still considered but cannot pursue CT angiogram of chest due to renal function.  Discussed with Dr. Olevia Bowens of the hospitalist service will arrange for hospital admission.  Will need further monitoring of blood pressure and kidney function.  Consider VQ scan in the morning.    EMERGENCY DEPARTMENT [place] CARDIAC EXAM "Study: Limited Ultrasound of the Heart and Pericardium"  INDICATIONS:Dyspnea Multiple views of the heart and pericardium were obtained in real-time with  a multi-frequency probe.  PERFORMED FY:[id number] IMAGES ARCHIVED?: Yes LIMITATIONS:  Body habitus and Emergent procedure VIEWS USED: Subcostal 4 chamber and Apical 4 chamber  INTERPRETATION: Cardiac activity present, Pericardial effusioin absent, Cardiac tamponade absent and Decreased contractility No R heart strain, no pericardial effusion  CRITICAL CARE Performed by: Ezequiel Essex Total critical care time: 35 minutes Critical care time was exclusive of separately billable procedures and treating other patients. Critical care was necessary to treat or prevent imminent or life-threatening deterioration. Critical care was time spent personally by me on the following activities: development of treatment plan with patient and/or surrogate as well as nursing, discussions with consultants, evaluation of patient's response to treatment, examination of patient, obtaining history from patient or surrogate, ordering and performing treatments and interventions, ordering and review of laboratory studies, ordering and review of radiographic studies, pulse oximetry and re-evaluation of patient's condition.   Final Clinical Impressions(s) / ED Diagnoses   Final diagnoses:  Syncope, unspecified syncope type  AKI (acute kidney injury) [facility])    ED Discharge Orders    None       Ezequiel Essex, MD 09/24/17 [phone]

## 2017-09-24 NOTE — Care Management Obs Status (Signed)
[place] NOTIFICATION   Patient Details  Name: [name] MRN: [id number] Date of Birth: 1940/06/13   Medicare Observation Status Notification Given:  Yes    Dericka Ostenson, Chauncey Reading, RN 09/24/2017, 3:16 PM

## 2017-09-24 NOTE — H&P (Signed)
History and Physical    [name] MCN:[id number] DOB: [birth year] DOA: 09/24/2017  PCP: Mayra Neer, MD   Patient coming from: Home.  I have personally briefly reviewed patient's old medical records in [place]  Chief Complaint: Loss of consciousness and chest pain.  HPI: [name] is a 77 y.o. male with medical history significant of CAD, CABG in 19, CABG in 2012 (see below for further details), chronic systolic heart failure, depression, GERD, type 2 diabetes mellitus, hypertension, hyperlipidemia, gout who is brought to the emergency department via EMS after passing out at home.  Per patient and his wife, he has been feeling progressively more lightheaded and less strength for the past few days.  Yesterday evening, he got up to go to the bathroom, felt dizzy and passed out.  He was assisted by his wife after this and woke up complaining of chest pain.  His wife gave him a sublingual nitroglycerin, which helped the pain, but EMS reports that it decreased his systolic blood pressure to 71mmHg.  Last month he had an increase in furosemide from 40 twice daily to 80 mg twice daily.  No fever, no chills, sore throat, dyspnea, productive cough, palpitations, (recent PND, orthopnea or pitting edema lower extremities).  No abdominal pain, nausea, emesis, diarrhea, constipation.  No melena or hematochezia.  No dysuria, frequency or hematuria.  No polyuria, polydipsia or polyphagia.  No heat or cold intolerance.  No pruritus or skin rashes.  ED Course: Initial vital signs temperature 98.4 F, pulse 72, respirations 20, blood pressure 99/48 mmHg and O2 sat 95% on room air.  EKG showed a ventricular paced rhythm.  First troponin was negative.  BNP was 93.0 pg/mL White count 8.7 with a normal differential, hemoglobin 11.4 g/dL and platelets 235.  PT and INR were normal.  Electrolytes are normal except for mildly decreased chloride at 97 mmol/L.  Glucose 304, BUN 49 and creatinine 2.15  mg/dL.  Albumin was 3.4 g/dL, but all other hepatic function tests were normal.  Imaging: Chest radiograph shows low inspiration with a stable cardiomegaly.  There is vascular congestion versus uncal vascular crowding.  CT head showed atrophy, but no acute intracranial pathology.  Please see images and full radiology report for further detail.  Review of Systems: As per HPI otherwise 10 point review of systems negative.    Past Medical History:  Diagnosis Date  . CAD (coronary artery disease)    s/p CABG 1997, PCI (BMS) of SVG to RCA 3/09, redo CABG 02/2011 with SVG to PDA, SVG to Lcx, s/p cath 2.2013 with ocluded SVT to left circ and patent SVG to RCA.  Cath 07/2013 Recent cath showed ischemic cardiomyopathy with LVEF less than 20%, with progressive LV dysfunction related to bypass graft failure, occlusion of the saphenous vein grafts placed in 2012 to the right coronary and to the circum  . CHF (congestive heart failure) ([place])   . Chronic systolic dysfunction of left ventricle    EF 30%  . Depression   . DJD (degenerative joint disease)   . DM (diabetes mellitus) ([place]) 02/14/2011  . GERD (gastroesophageal reflux disease)   . Gout   . HTN (hypertension) 02/14/2011  . Hyperlipemia   . Ischemic cardiomyopathy    s/p ICD Implantation by Dr Leonia Reeves  . Paroxysmal atrial fibrillation ([place]) 10/22/2014   single episode of AF x 3 hours 48 minutes recorded on ICD, chads2vasc score is at least 5   . Renal disorder  Past Surgical History:  Procedure Laterality Date  . BI-VENTRICULAR IMPLANTABLE CARDIOVERTER DEFIBRILLATOR UPGRADE N/A 08/24/2011   Procedure: BI-VENTRICULAR IMPLANTABLE CARDIOVERTER DEFIBRILLATOR UPGRADE;  Surgeon: Evans Lance, MD;  Location: [facility] CATH LAB;  Service: Cardiovascular;  Laterality: N/A;  . CARDIAC CATHETERIZATION  08/03/2015   Procedure: Left Heart Cath and Cors/Grafts Angiography;  Surgeon: Peter M Martinique, MD;  Location: [place] CV LAB;  Service: Cardiovascular;;   . CARDIAC CATHETERIZATION N/A 08/06/2015   Procedure: Coronary Stent Intervention w/Impella;  Surgeon: Jettie Booze, MD;  Location: [place] CV LAB;  Service: Cardiovascular;  Laterality: N/A;  . CARDIAC CATHETERIZATION  08/06/2015   Procedure: Left Heart Cath;  Surgeon: Jettie Booze, MD;  Location: [place] CV LAB;  Service: Cardiovascular;;  . CARDIAC CATHETERIZATION  08/06/2015   Procedure: Coronary Balloon Angioplasty;  Surgeon: Jettie Booze, MD;  Location: [place] CV LAB;  Service: Cardiovascular;;  . CARDIAC DEFIBRILLATOR PLACEMENT  08/2004   initial placement, upgraded to [place] ICD by Dr Lovena Le 08/24/11 (MDT)  . CATARACT EXTRACTION W/ INTRAOCULAR LENS  IMPLANT, BILATERAL  2012  . CHOLECYSTECTOMY  01/05/2012   Procedure: LAPAROSCOPIC CHOLECYSTECTOMY WITH INTRAOPERATIVE CHOLANGIOGRAM;  Surgeon: Joyice Faster. Cornett, MD;  Location: [place];  Service: General;  Laterality: N/A;  laparoscopic cholecysectoym with intraoperative cholangiogram  . COLONOSCOPY WITH PROPOFOL N/A 11/18/2013   Procedure: COLONOSCOPY WITH PROPOFOL;  Surgeon: Garlan Fair, MD;  Location: [facility] ENDOSCOPY;  Service: Endoscopy;  Laterality: N/A;  . CORONARY ANGIOPLASTY WITH STENT PLACEMENT  06/2007   BMS to SVG to RCA  . CORONARY ARTERY BYPASS GRAFT  01/1996   CABG x 5 LIMA to LAD SVG to diag1,2,svg to om,svg to RCA  . CORONARY ARTERY BYPASS GRAFT  03/06/2011   CABG X2; Procedure: REDO CORONARY ARTERY BYPASS GRAFTING (CABG);  Surgeon: Grace Isaac, MD;  Location: [place];  Service: Open Heart Surgery;  Laterality: N/A;  times two grafts using right saphenous vein harvested endoscopically.  Marland Kitchen KNEE ARTHROTOMY  ~ 1978   RIGHT KNEE CARTILAGE REMOVED  . LEFT HEART CATHETERIZATION WITH CORONARY ANGIOGRAM N/A 06/06/2011   Procedure: LEFT HEART CATHETERIZATION WITH CORONARY ANGIOGRAM;  Surgeon: Sueanne Margarita, MD;  Location: [place] CATH LAB;  Service: Cardiovascular;  Laterality: N/A;  . LEFT HEART CATHETERIZATION  WITH CORONARY/GRAFT ANGIOGRAM N/A 08/08/2013   Procedure: LEFT HEART CATHETERIZATION WITH Beatrix Fetters;  Surgeon: Sinclair Grooms, MD;  Location: [facility] CATH LAB;  Service: Cardiovascular;  Laterality: N/A;  . LUMBAR [place] SURGERY  2003  . RIGHT HEART CATHETERIZATION N/A 02/17/2014   Procedure: RIGHT HEART CATH;  Surgeon: Larey Dresser, MD;  Location: [facility] CATH LAB;  Service: Cardiovascular;  Laterality: N/A;  . VENOGRAM N/A 06/08/2011   Procedure: VENOGRAM;  Surgeon: Thompson Grayer, MD;  Location: [facility] CATH LAB;  Service: Cardiovascular;  Laterality: N/A;     reports that he quit smoking about 48 years ago. His smoking use included cigarettes. He has a 7.50 pack-year smoking history. He quit smokeless tobacco use about 46 years ago. He reports that he drinks about 0.6 oz of alcohol per week. He reports that he does not use drugs.  Allergies  Allergen Reactions  . Codeine Nausea And Vomiting  . Latex Rash    Specifies adhesive     Family History  Problem Relation Age of Onset  . Heart failure Mother   . Heart attack Mother   . Heart failure Father   . Heart attack Father   .  Coronary artery disease Unknown   . Sudden Cardiac Death Brother        in his 2's  . Heart attack Brother   . Cancer Brother     Prior to Admission medications   Medication Sig Start Date End Date Taking? Authorizing Provider  allopurinol (ZYLOPRIM) 100 MG tablet Take 100 mg by mouth daily.    Yes [provider]  aspirin EC 81 MG tablet Take 81 mg by mouth daily.   Yes [provider]  atorvastatin (LIPITOR) 80 MG tablet TAKE 1 TABLET BY MOUTH DAILY AT 6:00 PM 05/11/17  Yes Larey Dresser, MD  carvedilol (COREG) 25 MG tablet TAKE 1/2 (ONE-HALF) TABLET BY MOUTH TWICE DAILY WITH A MEAL 09/20/17  Yes Larey Dresser, MD  clopidogrel (PLAVIX) 75 MG tablet Take 1 tablet (75 mg total) by mouth daily. 02/03/14  Yes Allred, Jeneen Rinks, MD  eplerenone (INSPRA) 25 MG tablet Take 1 tablet (25 mg  total) by mouth daily. 05/11/17  Yes Larey Dresser, MD  furosemide (LASIX) 40 MG tablet Take 2 tablets (80 mg total) by mouth two times a day. 09/03/17  Yes Larey Dresser, MD  Insulin Glargine (TOUJEO SOLOSTAR) 300 UNIT/ML SOPN Inject 30 Units into the skin daily.   Yes [provider]  insulin regular human CONCENTRATED (HUMULIN R U-500 KWIKPEN) 500 UNIT/ML kwikpen 30 units before BFST, 20 at lunch and 35 before supper, take 30-60 minutes before each meal 02/04/16  Yes Elayne Snare, MD  losartan (COZAAR) 25 MG tablet Take 0.5 tablets (12.5 mg total) by mouth daily. 07/11/17 10/09/17 Yes Larey Dresser, MD  nitroGLYCERIN (NITROSTAT) 0.4 MG SL tablet DISSOLVE ONE TABLET UNDER THE TONGUE EVERY 5 MINUTES AS NEEDED FOR CHEST PAIN.  DO NOT EXCEED A TOTAL OF 3 DOSES IN 15 MINUTES 07/24/17  Yes Larey Dresser, MD  ONE TOUCH ULTRA TEST test strip Use to check blood sugar 3 times per day. 02/04/16  Yes Elayne Snare, MD  [facility] DELICA LANCETS 91Y MISC Use to check blood sugar 3 times per day dx code E11.9 02/04/16  Yes Elayne Snare, MD  ranolazine (RANEXA) 1000 MG SR tablet Take 500 mg by mouth 2 (two) times daily.   Yes [provider]  venlafaxine (EFFEXOR-XR) 150 MG 24 hr capsule Take 150 mg by mouth at bedtime.    Yes [provider]  Insulin Lispro Prot & Lispro (HUMALOG MIX 50/50 KWIKPEN) (50-50) 100 UNIT/ML Kwikpen 15 units at breakfast and lunch, 30 units before supper.  Take insulin 10 minutes before eating 11/05/15   Elayne Snare, MD  Insulin Pen Needle (RELION PEN NEEDLES) 32G X 4 MM MISC Use to inject insulin 3 times per day. 02/04/16   Elayne Snare, MD  pantoprazole (PROTONIX) 40 MG tablet TAKE ONE TABLET BY MOUTH ONCE DAILY 07/11/16   Bensimhon, Shaune Pascal, MD    Physical Exam: Vitals:   09/24/17 0230 09/24/17 0300 09/24/17 0330 09/24/17 0400  BP: (!) 83/46 (!) 90/55 (!) 85/52 (!) 84/46  Pulse: 64 67 65 62  Resp: 14 19 14 12   Temp:      TempSrc:      SpO2: 96% 95%  93% 98%  Weight:        Constitutional: NAD, calm, comfortable Eyes: PERRL, lids and conjunctivae normal ENMT: Mucous membranes are moist. Posterior pharynx clear of any exudate or lesions.  Neck: normal, supple, no masses, no thyromegaly Respiratory: clear to auscultation bilaterally, no wheezing, no crackles. Normal  respiratory effort. No accessory muscle use.  Chest: Left AICD present. Cardiovascular: Regular rate and rhythm, no murmurs / rubs / gallops. No extremity edema. 2+ pedal pulses. No carotid bruits.  Abdomen: Soft, no tenderness, no masses palpated. No hepatosplenomegaly. Bowel sounds positive.  Musculoskeletal: no clubbing / cyanosis. Good ROM, no contractures. Normal muscle tone.  Skin: Small areas of ecchymosis on extremities.  No rashes, lesions, ulcers on very limited skin examination. Neurologic: CN 2-12 grossly intact. Sensation intact, DTR normal. Strength 5/5 in all 4.  Psychiatric: Normal judgment and insight. Alert and oriented x 3. Normal mood.    Labs on Admission: I have personally reviewed following labs and imaging studies  CBC: Recent Labs  Lab 09/24/17 0156 09/24/17 0157  WBC  --  8.7  NEUTROABS  --  5.9  HGB 11.6* 11.4*  HCT 34.0* 35.4*  MCV  --  93.2  PLT  --  357   Basic Metabolic Panel: Recent Labs  Lab 09/24/17 0156 09/24/17 0157  NA 133* 135  K 4.0 4.2  CL 95* 97*  CO2  --  26  GLUCOSE 292* 304*  BUN 41* 49*  CREATININE 2.00* 2.15*  CALCIUM  --  9.2   GFR: CrCl cannot be calculated (Unknown ideal weight.). Liver Function Tests: Recent Labs  Lab 09/24/17 0157  AST 19  ALT 19  ALKPHOS 80  BILITOT 0.6  PROT 7.0  ALBUMIN 3.4*   No results for input(s): LIPASE, AMYLASE in the last 168 hours. No results for input(s): AMMONIA in the last 168 hours. Coagulation Profile: Recent Labs  Lab 09/24/17 0157  INR 1.09   Cardiac Enzymes: Recent Labs  Lab 09/24/17 0157  TROPONINI <0.03   BNP (last 3 results) No results for  input(s): PROBNP in the last 8760 hours. HbA1C: No results for input(s): HGBA1C in the last 72 hours. CBG: No results for input(s): GLUCAP in the last 168 hours. Lipid Profile: No results for input(s): CHOL, HDL, LDLCALC, TRIG, CHOLHDL, LDLDIRECT in the last 72 hours. Thyroid Function Tests: No results for input(s): TSH, T4TOTAL, FREET4, T3FREE, THYROIDAB in the last 72 hours. Anemia Panel: No results for input(s): VITAMINB12, FOLATE, FERRITIN, TIBC, IRON, RETICCTPCT in the last 72 hours. Urine analysis:    Component Value Date/Time   COLORURINE STRAW (A) 05/01/2017 1505   APPEARANCEUR CLEAR 05/01/2017 1505   LABSPEC 1.009 05/01/2017 1505   PHURINE 5.0 05/01/2017 1505   GLUCOSEU 50 (A) 05/01/2017 1505   HGBUR NEGATIVE 05/01/2017 1505   BILIRUBINUR NEGATIVE 05/01/2017 1505   KETONESUR NEGATIVE 05/01/2017 1505   PROTEINUR NEGATIVE 05/01/2017 1505   UROBILINOGEN 2.0 (H) 12/23/2011 2145   NITRITE NEGATIVE 05/01/2017 1505   LEUKOCYTESUR NEGATIVE 05/01/2017 1505    Radiological Exams on Admission: Ct Head Wo Contrast  Result Date: 09/24/2017 CLINICAL DATA:  Altered level consciousness. Patient reports syncope. EXAM: CT HEAD WITHOUT CONTRAST TECHNIQUE: Contiguous axial images were obtained from the base of the skull through the vertex without intravenous contrast. COMPARISON:  Head CT 05/01/2017 FINDINGS: Brain: Stable degree of atrophy and chronic small vessel ischemia. No intracranial hemorrhage, mass effect, or midline shift. No hydrocephalus. The basilar cisterns are patent. No evidence of territorial infarct or acute ischemia. No extra-axial or intracranial fluid collection. Vascular: Atherosclerosis of skullbase vasculature without hyperdense vessel or abnormal calcification. Skull: No fracture or focal lesion. Sinuses/Orbits: Prior mucosal thickening of left sphenoid sinus has resolved. Minimal opacification of lower right mastoid air cells. Bilateral cataract resection. Other: None.  IMPRESSION: 1.  No acute intracranial abnormality. 2. Atrophy and chronic small vessel ischemia, unchanged from prior. Electronically Signed   By: Jeb Levering M.D.   On: 09/24/2017 03:19   Dg Chest Portable 1 View  Result Date: 09/24/2017 CLINICAL DATA:  Syncope. EXAM: PORTABLE CHEST 1 VIEW COMPARISON:  05/11/2017 FINDINGS: Lung volumes are low. Left-sided pacemaker in place. Patient is post median sternotomy. Cardiomegaly is similar to prior exam. Bronchovascular crowding versus vascular congestion. Limited assessment of the left lung base due to soft tissue attenuation in the lung volumes. No right pleural effusion. No pneumothorax. IMPRESSION: Hypoventilatory chest. Similar cardiomegaly. Vascular congestion versus bronchovascular crowding. Electronically Signed   By: Jeb Levering M.D.   On: 09/24/2017 02:27   07/11/2017 echocardiogram complete ------------------------------------------------------------------- LV EF: 30% -   35%  ------------------------------------------------------------------- Indications:      CHF - 428.0.  ------------------------------------------------------------------- History:   PMH:  Ischemic Cardiomyopathy.  Coronary artery disease.  Congestive heart failure.  Risk factors:  Hypertension. Diabetes mellitus. Dyslipidemia.  ------------------------------------------------------------------- Study Conclusions  - Left ventricle: The cavity size was mildly dilated. Wall   thickness was increased in a pattern of mild LVH. Systolic   function was moderately to severely reduced. The estimated   ejection fraction was in the range of 30% to 35%. Inferior and   inferolateral akinesis. Doppler parameters are consistent with   abnormal left ventricular relaxation (grade 1 diastolic   dysfunction). - Aortic valve: There was no stenosis. - Aorta: Mildly dilated aortic root. Aortic root dimension: 40 mm   (ED). - Mitral valve: Mildly calcified annulus.  Normal thickness leaflets   . There was mild regurgitation. - Left atrium: The atrium was mildly dilated. - Right ventricle: The cavity size was normal. Pacer wire or   catheter noted in right ventricle. Systolic function was mildly   reduced. - Pulmonary arteries: No complete TR doppler jet so unable to   estimate PA systolic pressure. - Inferior vena cava: The vessel was normal in size. The   respirophasic diameter changes were in the normal range (>= 50%),   consistent with normal central venous pressure.  Impressions:  - Mildly dilated LV with mild LV hypertrophy. EF 30-35%, inferior   and inferolateral akinesis. Normal RV size with mildly decreased   systolic function. Mild mitral regurgitation.  EKG: Independently reviewed.  Vent. rate 75 BPM PR interval * ms QRS duration 143 ms QT/QTc 476/532 ms P-R-T axes 61 212 83 Ventricular-paced rhythm No further analysis attempted due to paced rhythm Not significantly changed from previous.  Assessment/Plan Principal Problem:   Syncope and collapse Likely due to volume depletion. Volume depletion likely secondary to increase furosemide dose. Hold diuretics and antihypertensives. Continue gentle and time-limited IV fluids. Trend troponin level. He had a recent echo. CT head was negative and there are no neuro deficits. Although most likely will be low probability, consider VQ scan to rule out pulmonary embolism.  Active Problems:   CKD (chronic kidney disease) stage 3, GFR 30-59 ml/min (HCC)   Acute kidney injury ([place]) Secondary to overdiuresis. Continue gentle and time-limited IV fluids. Monitor intake and output and daily weights. I discussed with the patient and his wife the need to have daily weights and I&Os at home. I briefly explained to them how they can do this daily for better controlled. Also to monitor for lightheadedness, decreased urination and urine concentration.    Hyperlipemia Continue Lipitor 80 mg  p.o. daily.    CAD (coronary artery disease) Continue Lipitor and antiplatelet  agents. Hold beta-blocker while hypotensive.    HTN (hypertension) Hold antihypertensive agents at this time. Monitor blood pressure. Resume antihypertensive therapy after hypotension resolved.    Chronic systolic CHF (congestive heart failure) (HCC) Hypovolemic at this time.  No signs of decompensation.    Paroxysmal atrial fibrillation (HCC) CHA?DS?-VASc Score of at least 6.   Not on anticoagulation. Resume beta-blocker when appropriate.    GERD (gastroesophageal reflux disease) Protonix 40 mg p.o. daily.    Type 2 diabetes mellitus (HCC) Carbohydrate modified diet. Continue Lantus 30 units SQ daily. CBG monitoring regular insulin sliding scale.    DVT prophylaxis: Heparin SQ. Code Status: Full code. Family Communication: His wife was present in the ED room. Disposition Plan: Observation for 24 to 48 hours for gentle rehydration and syncope work-up. Consults called:  Admission status: Observation/stepdown.   Reubin Milan MD [facility] Pager [phone]  If 7PM-7AM, please contact night-coverage [email or web address] Password [email or web address]  09/24/2017, 4:08 AM

## 2017-09-25 ENCOUNTER — Encounter (HOSPITAL_COMMUNITY): Payer: Self-pay | Admitting: Student

## 2017-09-25 DIAGNOSIS — I25708 Atherosclerosis of coronary artery bypass graft(s), unspecified, with other forms of angina pectoris: Secondary | ICD-10-CM

## 2017-09-25 DIAGNOSIS — I480 Paroxysmal atrial fibrillation: Secondary | ICD-10-CM

## 2017-09-25 DIAGNOSIS — N183 Chronic kidney disease, stage 3 (moderate): Secondary | ICD-10-CM | POA: Diagnosis not present

## 2017-09-25 DIAGNOSIS — Z95810 Presence of automatic (implantable) cardiac defibrillator: Secondary | ICD-10-CM | POA: Diagnosis not present

## 2017-09-25 DIAGNOSIS — R55 Syncope and collapse: Secondary | ICD-10-CM | POA: Diagnosis not present

## 2017-09-25 DIAGNOSIS — I10 Essential (primary) hypertension: Secondary | ICD-10-CM

## 2017-09-25 DIAGNOSIS — N179 Acute kidney failure, unspecified: Secondary | ICD-10-CM | POA: Diagnosis not present

## 2017-09-25 DIAGNOSIS — I5042 Chronic combined systolic (congestive) and diastolic (congestive) heart failure: Secondary | ICD-10-CM | POA: Diagnosis not present

## 2017-09-25 DIAGNOSIS — I255 Ischemic cardiomyopathy: Secondary | ICD-10-CM | POA: Diagnosis not present

## 2017-09-25 DIAGNOSIS — E782 Mixed hyperlipidemia: Secondary | ICD-10-CM | POA: Diagnosis not present

## 2017-09-25 LAB — GLUCOSE, CAPILLARY
GLUCOSE-CAPILLARY: 111 mg/dL — AB (ref 65–99)
Glucose-Capillary: 160 mg/dL — ABNORMAL HIGH (ref 65–99)

## 2017-09-25 LAB — BASIC METABOLIC PANEL
Anion gap: 6 (ref 5–15)
BUN: 40 mg/dL — AB (ref 6–20)
CALCIUM: 8.6 mg/dL — AB (ref 8.9–10.3)
CHLORIDE: 107 mmol/L (ref 101–111)
CO2: 26 mmol/L (ref 22–32)
CREATININE: 1.5 mg/dL — AB (ref 0.61–1.24)
GFR calc non Af Amer: 44 mL/min — ABNORMAL LOW (ref >60)
GFR, EST AFRICAN AMERICAN: 51 mL/min — AB (ref >60)
Glucose, Bld: 121 mg/dL — ABNORMAL HIGH (ref 65–99)
Potassium: 4 mmol/L (ref 3.5–5.1)
SODIUM: 139 mmol/L (ref 135–145)

## 2017-09-25 LAB — MAGNESIUM: MAGNESIUM: 1.6 mg/dL — AB (ref 1.7–2.4)

## 2017-09-25 MED ORDER — CEFUROXIME AXETIL 250 MG PO TABS: 250.0000 mg | ORAL_TABLET | Freq: Two times a day (BID) | ORAL | 0 refills | Status: DC

## 2017-09-25 MED ORDER — MAGNESIUM OXIDE 400 (241.3 MG) MG PO TABS
400.0000 mg | ORAL_TABLET | Freq: Every day | ORAL | Status: DC
  Administered 2017-09-25: 400 mg via ORAL
  Filled 2017-09-25: qty 1

## 2017-09-25 MED ORDER — FUROSEMIDE 20 MG PO TABS: 60.0000 mg | ORAL_TABLET | Freq: Two times a day (BID) | ORAL | 0 refills | Status: DC

## 2017-09-25 NOTE — Discharge Summary (Signed)
Physician Discharge Summary  [name] KNL:[id number] DOB: August 15, 1940 DOA: 09/24/2017  PCP: Mayra Neer, MD  Admit date: 09/24/2017 Discharge date: 09/25/2017  Admitted From: Home Disposition:  Home   Recommendations for Outpatient Follow-up:  1. Follow up with PCP in 1-2 weeks 2. Please obtain BMP/CBC in one week    Discharge Condition: Stable CODE STATUS: FULL Diet recommendation: Heart Healthy / Carb Modified   Brief/Interim Summary: 77 year old male with a history of coronary artery disease status post CABG, ischemic cardiomyopathy, chronic systolic and diastolic CHF, diabetes mellitus, hyperlipidemia, paroxysmal atrial fibrillation, hypertension, CKD stage III presenting with a presyncopal episode.  The patient was getting up to go to the bathroom on the evening of 09/23/2017 when he experienced dizziness.  The patient fell to the ground, but did not completely lose consciousness.  His wife came to help him up and EMS was activated.  According to the patient, he has been feeling some generalized weakness and malaise for the past 2 days prior to this admission.  His wife has been taking his blood pressure, and she states that his systolic pressure has been running in the 80s at home.  The patient states that he has increased his furosemide to 80 mg twice daily.  Review of the medical record shows that his cardiologist, Dr. Algernon Huxley instructed the patient to increase his furosemide on 08/30/2017.  The patient denied any chest discomfort, shortness breath, nausea, vomiting, diarrhea, abdominal pain, dysuria, hematuria, headache, neck pain.  He states that he has been eating well otherwise.  His CBGs have been under good control.  He denies any AICD shocks.  In the emergency department, the patient was afebrile with soft blood pressures in the upper 80s and low 90s.  He was saturating 9690% on room air.  The patient was admitted for further evaluation and observation.  Notably, the patient  received 1500 cc of fluid in the emergency department.    Discharge Diagnoses:  Presyncope/dizziness -Likely secondary to hypotension, likely related to his recent increase in furosemide dosing -07/11/2017 echo EF 30-35%, grade 1 DD, mild MR, inferolateral AK -Check orthostatics--neg but SBP in 80s initially -UA 21-50 WBC -started empiric ceftriaxone--culture pending at time of d/c -home with cefuroxime bid x 4 more days -Cardiology evaluation appreciated--ok to d/c with lasix 60 mg bid -AICD interrogated--no dysrhythmias. -hypotension improved with IVF -symptomatically improved after IVF  Chronic systolic and diastolic CHF/ischemic cardiomyopathy -07/11/2017 Echo--as discussed above -The patient appears clinically euvolemic -Daily weights-discharge weight 197 -Holding furosemide dosing for 6/10--restart lasix 60 mg bid per cardiology after d/c -restart coreg home dose -Holding losartan initially secondary to hypotension-->restart after d/c -Continue aspirin, Lipitor, Plavix, Ranexa -Holding eplerenone--restart after d/c -d/c IVF  Diabetes mellitus type 2, uncontrolled with hyperglycemia -Check hemoglobin A1c--10.2 -Lantus during hospitalization -NovoLog sliding scale -pt endorsed dietary indiscretion -resume home dose Toujeo after d/c  Acute on chronic renal failure --CKD stage III -Secondary to volume depletion -Baseline creatinine 1.3-1.6 -Presenting creatinine 2.15 -Hold losartan--restart after d/c -Hold furosemide and eplerenone--restart after d/c -serum creatinine 1.50 on day of d/c  Coronary artery disease with history of CABG -No chest pain presently -Personally reviewed EKG--paced rhythm -Chest x-ray--low volumes, increased interstitial markings  Hyperlipidemia -Continue statin      Discharge Instructions   Allergies as of 09/25/2017      Reactions   Codeine Nausea And Vomiting   Latex Rash   Specifies adhesive       Medication List      STOP  taking these medications   Insulin Lispro Prot & Lispro (50-50) 100 UNIT/ML Kwikpen Commonly known as:  HUMALOG MIX 50/50 KWIKPEN     TAKE these medications   allopurinol 100 MG tablet Commonly known as:  ZYLOPRIM Take 100 mg by mouth daily.   aspirin EC 81 MG tablet Take 81 mg by mouth daily.   atorvastatin 80 MG tablet Commonly known as:  LIPITOR TAKE 1 TABLET BY MOUTH DAILY AT 6:00 PM   carvedilol 25 MG tablet Commonly known as:  COREG TAKE 1/2 (ONE-HALF) TABLET BY MOUTH TWICE DAILY WITH A MEAL   cefUROXime 250 MG tablet Commonly known as:  CEFTIN Take 1 tablet (250 mg total) by mouth 2 (two) times daily with a meal.   clopidogrel 75 MG tablet Commonly known as:  PLAVIX Take 1 tablet (75 mg total) by mouth daily.   eplerenone 25 MG tablet Commonly known as:  INSPRA Take 1 tablet (25 mg total) by mouth daily.   furosemide 20 MG tablet Commonly known as:  LASIX Take 3 tablets (60 mg total) by mouth 2 (two) times daily. What changed:    medication strength  how much to take  how to take this  when to take this  additional instructions   insulin regular human CONCENTRATED 500 UNIT/ML kwikpen Commonly known as:  HUMULIN R U-500 KWIKPEN 30 units before BFST, 20 at lunch and 35 before supper, take 30-60 minutes before each meal   losartan 25 MG tablet Commonly known as:  COZAAR Take 0.5 tablets (12.5 mg total) by mouth daily.   nitroGLYCERIN 0.4 MG SL tablet Commonly known as:  NITROSTAT DISSOLVE ONE TABLET UNDER THE TONGUE EVERY 5 MINUTES AS NEEDED FOR CHEST PAIN.  DO NOT EXCEED A TOTAL OF 3 DOSES IN 15 MINUTES   pantoprazole 40 MG tablet Commonly known as:  PROTONIX TAKE ONE TABLET BY MOUTH ONCE DAILY   QUEtiapine 100 MG tablet Commonly known as:  SEROQUEL Take 1 tablet by mouth daily.   RANEXA 1000 MG SR tablet Generic drug:  ranolazine Take 500 mg by mouth 2 (two) times daily.   TOUJEO SOLOSTAR 300 UNIT/ML Sopn Generic drug:  Insulin  Glargine Inject 30 Units into the skin daily.   venlafaxine XR 150 MG 24 hr capsule Commonly known as:  EFFEXOR-XR Take 150 mg by mouth at bedtime.      Follow-up Information    Larey Dresser, MD Follow up on 10/04/2017.   Specialty:  Cardiology Why:  Cardiology Follow-Up on 10/04/2017 at 11:40AM.  Contact information: [address] [place]  [address] [phone]          Allergies  Allergen Reactions  . Codeine Nausea And Vomiting  . Latex Rash    Specifies adhesive     Consultations:  cardiology   Procedures/Studies: Ct Head Wo Contrast  Result Date: 09/24/2017 CLINICAL DATA:  Altered level consciousness. Patient reports syncope. EXAM: CT HEAD WITHOUT CONTRAST TECHNIQUE: Contiguous axial images were obtained from the base of the skull through the vertex without intravenous contrast. COMPARISON:  Head CT 05/01/2017 FINDINGS: Brain: Stable degree of atrophy and chronic small vessel ischemia. No intracranial hemorrhage, mass effect, or midline shift. No hydrocephalus. The basilar cisterns are patent. No evidence of territorial infarct or acute ischemia. No extra-axial or intracranial fluid collection. Vascular: Atherosclerosis of skullbase vasculature without hyperdense vessel or abnormal calcification. Skull: No fracture or focal lesion. Sinuses/Orbits: Prior mucosal thickening of left sphenoid sinus has resolved. Minimal opacification of lower right  mastoid air cells. Bilateral cataract resection. Other: None. IMPRESSION: 1.  No acute intracranial abnormality. 2. Atrophy and chronic small vessel ischemia, unchanged from prior. Electronically Signed   By: Jeb Levering M.D.   On: 09/24/2017 03:19   Nm Pulmonary Vent And Perf (v/q Scan)  Result Date: 09/24/2017 CLINICAL DATA:  77 year old male with uncertain history EXAM: NUCLEAR MEDICINE VENTILATION - PERFUSION LUNG SCAN TECHNIQUE: Ventilation images were obtained in multiple projections using inhaled aerosol  Tc-8m DTPA. Perfusion images were obtained in multiple projections after intravenous injection of Tc-74m-MAA. RADIOPHARMACEUTICALS:  Thirty-one mCi of Tc-36m DTPA aerosol inhalation and 4.4 mCi Tc56m-MAA IV COMPARISON:  Chest x-ray 09/24/2017 FINDINGS: Ventilation: No focal ventilation defect. Perfusion: Attenuation artifact present on the LPO images and right lateral images,, as well as from the pacer artifact on the left chest with no defects identified. IMPRESSION: V/Q scan negative for evidence of pulmonary embolism. Electronically Signed   By: Corrie Mckusick D.O.   On: 09/24/2017 12:22   Dg Chest Portable 1 View  Result Date: 09/24/2017 CLINICAL DATA:  Syncope. EXAM: PORTABLE CHEST 1 VIEW COMPARISON:  05/11/2017 FINDINGS: Lung volumes are low. Left-sided pacemaker in place. Patient is post median sternotomy. Cardiomegaly is similar to prior exam. Bronchovascular crowding versus vascular congestion. Limited assessment of the left lung base due to soft tissue attenuation in the lung volumes. No right pleural effusion. No pneumothorax. IMPRESSION: Hypoventilatory chest. Similar cardiomegaly. Vascular congestion versus bronchovascular crowding. Electronically Signed   By: Jeb Levering M.D.   On: 09/24/2017 02:27        Discharge Exam: Vitals:   09/25/17 0811 09/25/17 0900  BP:  126/70  Pulse:  71  Resp:  14  Temp: 97.7 F (36.5 C)   SpO2:     Vitals:   09/25/17 0700 09/25/17 0800 09/25/17 0811 09/25/17 0900  BP: 134/63 130/71  126/70  Pulse: 68 68  71  Resp: 12 15  14   Temp:   97.7 F (36.5 C)   TempSrc:   Oral   SpO2:      Weight:      Height:        General: Pt is alert, awake, not in acute distress Cardiovascular: RRR, S1/S2 +, no rubs, no gallops Respiratory: CTA bilaterally, no wheezing, no rhonchi Abdominal: Soft, NT, ND, bowel sounds + Extremities: no edema, no cyanosis   The results of significant diagnostics from this hospitalization (including imaging,  microbiology, ancillary and laboratory) are listed below for reference.    Significant Diagnostic Studies: Ct Head Wo Contrast  Result Date: 09/24/2017 CLINICAL DATA:  Altered level consciousness. Patient reports syncope. EXAM: CT HEAD WITHOUT CONTRAST TECHNIQUE: Contiguous axial images were obtained from the base of the skull through the vertex without intravenous contrast. COMPARISON:  Head CT 05/01/2017 FINDINGS: Brain: Stable degree of atrophy and chronic small vessel ischemia. No intracranial hemorrhage, mass effect, or midline shift. No hydrocephalus. The basilar cisterns are patent. No evidence of territorial infarct or acute ischemia. No extra-axial or intracranial fluid collection. Vascular: Atherosclerosis of skullbase vasculature without hyperdense vessel or abnormal calcification. Skull: No fracture or focal lesion. Sinuses/Orbits: Prior mucosal thickening of left sphenoid sinus has resolved. Minimal opacification of lower right mastoid air cells. Bilateral cataract resection. Other: None. IMPRESSION: 1.  No acute intracranial abnormality. 2. Atrophy and chronic small vessel ischemia, unchanged from prior. Electronically Signed   By: Jeb Levering M.D.   On: 09/24/2017 03:19   Nm Pulmonary Vent And Perf (v/q Scan)  Result  Date: 09/24/2017 CLINICAL DATA:  77 year old male with uncertain history EXAM: NUCLEAR MEDICINE VENTILATION - PERFUSION LUNG SCAN TECHNIQUE: Ventilation images were obtained in multiple projections using inhaled aerosol Tc-68m DTPA. Perfusion images were obtained in multiple projections after intravenous injection of Tc-25m-MAA. RADIOPHARMACEUTICALS:  Thirty-one mCi of Tc-34m DTPA aerosol inhalation and 4.4 mCi Tc55m-MAA IV COMPARISON:  Chest x-ray 09/24/2017 FINDINGS: Ventilation: No focal ventilation defect. Perfusion: Attenuation artifact present on the LPO images and right lateral images,, as well as from the pacer artifact on the left chest with no defects identified.  IMPRESSION: V/Q scan negative for evidence of pulmonary embolism. Electronically Signed   By: Corrie Mckusick D.O.   On: 09/24/2017 12:22   Dg Chest Portable 1 View  Result Date: 09/24/2017 CLINICAL DATA:  Syncope. EXAM: PORTABLE CHEST 1 VIEW COMPARISON:  05/11/2017 FINDINGS: Lung volumes are low. Left-sided pacemaker in place. Patient is post median sternotomy. Cardiomegaly is similar to prior exam. Bronchovascular crowding versus vascular congestion. Limited assessment of the left lung base due to soft tissue attenuation in the lung volumes. No right pleural effusion. No pneumothorax. IMPRESSION: Hypoventilatory chest. Similar cardiomegaly. Vascular congestion versus bronchovascular crowding. Electronically Signed   By: Jeb Levering M.D.   On: 09/24/2017 02:27     Microbiology: Recent Results (from the past 240 hour(s))  MRSA PCR Screening     Status: None   Collection Time: 09/24/17  2:36 PM  Result Value Ref Range Status   MRSA by PCR NEGATIVE NEGATIVE Final    Comment:        The GeneXpert MRSA Assay (FDA approved for NASAL specimens only), is one component of a comprehensive MRSA colonization surveillance program. It is not intended to diagnose MRSA infection nor to guide or monitor treatment for MRSA infections. Performed at [facility], [address] [place], [place] [address]      Labs: Basic Metabolic Panel: Recent Labs  Lab 09/24/17 0156 09/24/17 0157 09/25/17 0600  NA 133* 135 139  K 4.0 4.2 4.0  CL 95* 97* 107  CO2  --  26 26  GLUCOSE 292* 304* 121*  BUN 41* 49* 40*  CREATININE 2.00* 2.15* 1.50*  CALCIUM  --  9.2 8.6*  MG  --   --  1.6*   Liver Function Tests: Recent Labs  Lab 09/24/17 0157  AST 19  ALT 19  ALKPHOS 80  BILITOT 0.6  PROT 7.0  ALBUMIN 3.4*   No results for input(s): LIPASE, AMYLASE in the last 168 hours. No results for input(s): AMMONIA in the last 168 hours. CBC: Recent Labs  Lab 09/24/17 0156 09/24/17 0157 09/24/17 0836    WBC  --  8.7 9.3  NEUTROABS  --  5.9  --   HGB 11.6* 11.4* 11.1*  HCT 34.0* 35.4* 34.4*  MCV  --  93.2 93.5  PLT  --  235 228   Cardiac Enzymes: Recent Labs  Lab 09/24/17 0157 09/24/17 0801 09/24/17 1333  TROPONINI <0.03 <0.03 <0.03   BNP: Invalid input(s): POCBNP CBG: Recent Labs  Lab 09/24/17 0805 09/24/17 1219 09/24/17 1701 09/24/17 2133 09/25/17 0749  GLUCAP 233* 214* 160* 177* 111*    Time coordinating discharge:  36 minutes  Signed:  Orson Eva, DO [facility] Pager: [phone] 09/25/2017, 11:22 AM

## 2017-09-25 NOTE — Consult Note (Addendum)
Cardiology Consult    Patient ID: [name]; [id number]; 10/12/1940   Admit date: 09/24/2017 Date of Consult: 09/25/2017  Primary Care Provider: Mayra Neer, MD Primary Cardiologist: Dr. Aundra Dubin (CHF) EP: Dr. Rayann Heman  Patient Profile    [name] is a 77 y.o. male with past medical history of CAD (s/p CABG in 1997 with re-do CABG in 2012, occluded SVG's by cath in 2015, stenting of LM in 07/2015 with PCI of LCx performed as well), chronic combined systolic and diastolic CHF (EF 84-69% by echo in 06/2017), ischemic cardiomyopathy (s/p Medtronic CRT-D), HTN, HLD, Type 2 DM, and Stage 3 CKD who is being seen today for the evaluation of syncope at the request of Dr. Carles Collet.    History of Present Illness    [name] is followed by the Advanced Heart Failure clinic and was last evaluated by Dr. Aundra Dubin on 07/11/2017 and reported some dyspnea on exertion and fatigue but denied any associated chest discomfort. Weight was stable at 195 lbs and [name] was continued on Lasix 60mg  BID along with Eplerenone 25mg  daily (previously intolerant to Entresto), Coreg, and Losartan.    Recent device interrogations in the interim have shown worsening fluid accumulation, therefore Lasix was titrated to 80 mg twice daily on 08/30/2017.  Repeat labs on 09/11/2017 showed creatinine was overall stable at 1.64.  The patient presented to [facility] ED on 09/24/2017 for evaluation of a syncopal event. In talking with the patient and his wife today, [name] reports having worsening fatigue over the past several days leading to admission. Says breathing had overall been at baseline but [name] noted decreased energy.  Weight was overall stable on his home scales with no associated orthopnea, PND, or lower extremity edema. [name] stood up from the couch to walk to the restroom and reports [name] developed new onset dizziness and [name] told his wife [name] was going to faint. [name] did lose consciousness for a few seconds and she was unsure what to do,  therefore she administered sublingual nitroglycerin. [name] denies any actual chest pain at that time. Upon EMS arrival, [name] was noted to be significantly hypotensive.   His device was interrogated while in the ED and showed no recent discharges or significant arrhythmias. Initial labs show WBC 8.7, Hgb 11.4, platelets 235, Na+ 135, K+ 4.2, creatinine 2.15 (previous baseline 1.5 - 1.6). Initial and cyclic troponin values have been negative. CXR shows cardiomegaly with vascular congestion. CT Head with no acute intracranial abnormalities. VQ Scan negative for PE. EKG shows V-paced rhythm, HR 75.  Was started on IVF at 100 mL at the time of admission with Lasix and Eplerenone being held. Repeat labs this morning shows creatinine has significantly improved back to 1.50. Mg low at 1.6.   Past Medical History:  Diagnosis Date  . CAD (coronary artery disease)    a. s/p CABG 1997 b. PCI (BMS) of SVG to RCA 3/09 c. redo CABG 02/2011 with SVG to PDA, SVG to Lcx d. Cath 07/2013: ischemic cardiomyopathy with LVEF less than 20%, occlusion of the SVG's placed in 2012 e.stenting of LM in 07/2015 with PCI of LCx performed as well  . CHF (congestive heart failure) ([place])   . Chronic systolic dysfunction of left ventricle    EF 30%  . Depression   . DJD (degenerative joint disease)   . DM (diabetes mellitus) ([place]) 02/14/2011  . GERD (gastroesophageal reflux disease)   . Gout   . HTN (hypertension) 02/14/2011  .  Hyperlipemia   . Ischemic cardiomyopathy    s/p ICD Implantation by Dr Leonia Reeves  . Paroxysmal atrial fibrillation ([place]) 10/22/2014   single episode of AF x 3 hours 48 minutes recorded on ICD, [id number] score is at least 5   . Renal disorder     Past Surgical History:  Procedure Laterality Date  . BI-VENTRICULAR IMPLANTABLE CARDIOVERTER DEFIBRILLATOR UPGRADE N/A 08/24/2011   Procedure: BI-VENTRICULAR IMPLANTABLE CARDIOVERTER DEFIBRILLATOR UPGRADE;  Surgeon: Evans Lance, MD;  Location: [facility] CATH LAB;   Service: Cardiovascular;  Laterality: N/A;  . CARDIAC CATHETERIZATION  08/03/2015   Procedure: Left Heart Cath and Cors/Grafts Angiography;  Surgeon: Peter M Martinique, MD;  Location: [place] CV LAB;  Service: Cardiovascular;;  . CARDIAC CATHETERIZATION N/A 08/06/2015   Procedure: Coronary Stent Intervention w/Impella;  Surgeon: Jettie Booze, MD;  Location: [place] CV LAB;  Service: Cardiovascular;  Laterality: N/A;  . CARDIAC CATHETERIZATION  08/06/2015   Procedure: Left Heart Cath;  Surgeon: Jettie Booze, MD;  Location: [place] CV LAB;  Service: Cardiovascular;;  . CARDIAC CATHETERIZATION  08/06/2015   Procedure: Coronary Balloon Angioplasty;  Surgeon: Jettie Booze, MD;  Location: [place] CV LAB;  Service: Cardiovascular;;  . CARDIAC DEFIBRILLATOR PLACEMENT  08/2004   initial placement, upgraded to Williams ICD by Dr Lovena Le 08/24/11 (MDT)  . CATARACT EXTRACTION W/ INTRAOCULAR LENS  IMPLANT, BILATERAL  2012  . CHOLECYSTECTOMY  01/05/2012   Procedure: LAPAROSCOPIC CHOLECYSTECTOMY WITH INTRAOPERATIVE CHOLANGIOGRAM;  Surgeon: Joyice Faster. Cornett, MD;  Location: [place];  Service: General;  Laterality: N/A;  laparoscopic cholecysectoym with intraoperative cholangiogram  . COLONOSCOPY WITH PROPOFOL N/A 11/18/2013   Procedure: COLONOSCOPY WITH PROPOFOL;  Surgeon: Garlan Fair, MD;  Location: [facility] ENDOSCOPY;  Service: Endoscopy;  Laterality: N/A;  . CORONARY ANGIOPLASTY WITH STENT PLACEMENT  06/2007   BMS to SVG to RCA  . CORONARY ARTERY BYPASS GRAFT  01/1996   CABG x 5 LIMA to LAD SVG to diag1,2,svg to om,svg to RCA  . CORONARY ARTERY BYPASS GRAFT  03/06/2011   CABG X2; Procedure: REDO CORONARY ARTERY BYPASS GRAFTING (CABG);  Surgeon: Grace Isaac, MD;  Location: [place];  Service: Open Heart Surgery;  Laterality: N/A;  times two grafts using right saphenous vein harvested endoscopically.  Marland Kitchen KNEE ARTHROTOMY  ~ 1978   RIGHT KNEE CARTILAGE REMOVED  . LEFT HEART CATHETERIZATION  WITH CORONARY ANGIOGRAM N/A 06/06/2011   Procedure: LEFT HEART CATHETERIZATION WITH CORONARY ANGIOGRAM;  Surgeon: Sueanne Margarita, MD;  Location: [place] CATH LAB;  Service: Cardiovascular;  Laterality: N/A;  . LEFT HEART CATHETERIZATION WITH CORONARY/GRAFT ANGIOGRAM N/A 08/08/2013   Procedure: LEFT HEART CATHETERIZATION WITH Beatrix Fetters;  Surgeon: Sinclair Grooms, MD;  Location: [facility] CATH LAB;  Service: Cardiovascular;  Laterality: N/A;  . LUMBAR [place] SURGERY  2003  . RIGHT HEART CATHETERIZATION N/A 02/17/2014   Procedure: RIGHT HEART CATH;  Surgeon: Larey Dresser, MD;  Location: [facility] CATH LAB;  Service: Cardiovascular;  Laterality: N/A;  . VENOGRAM N/A 06/08/2011   Procedure: VENOGRAM;  Surgeon: Thompson Grayer, MD;  Location: [facility] CATH LAB;  Service: Cardiovascular;  Laterality: N/A;     Home Medications:  Prior to Admission medications   Medication Sig Start Date End Date Taking? Authorizing Provider  allopurinol (ZYLOPRIM) 100 MG tablet Take 100 mg by mouth daily.    Yes [provider]  aspirin EC 81 MG tablet Take 81 mg by mouth daily.   Yes [provider]  atorvastatin (  LIPITOR) 80 MG tablet TAKE 1 TABLET BY MOUTH DAILY AT 6:00 PM 05/11/17  Yes Larey Dresser, MD  carvedilol (COREG) 25 MG tablet TAKE 1/2 (ONE-HALF) TABLET BY MOUTH TWICE DAILY WITH A MEAL 09/20/17  Yes Larey Dresser, MD  clopidogrel (PLAVIX) 75 MG tablet Take 1 tablet (75 mg total) by mouth daily. 02/03/14  Yes Allred, Jeneen Rinks, MD  eplerenone (INSPRA) 25 MG tablet Take 1 tablet (25 mg total) by mouth daily. 05/11/17  Yes Larey Dresser, MD  furosemide (LASIX) 40 MG tablet Take 2 tablets (80 mg total) by mouth two times a day. Patient taking differently: Take 40 mg by mouth 2 (two) times daily. Take 2 tablets (80 mg total) by mouth two times a day. 09/03/17  Yes Larey Dresser, MD  Insulin Glargine (TOUJEO SOLOSTAR) 300 UNIT/ML SOPN Inject 30 Units into the skin daily.   Yes [provider]    insulin regular human CONCENTRATED (HUMULIN R U-500 KWIKPEN) 500 UNIT/ML kwikpen 30 units before BFST, 20 at lunch and 35 before supper, take 30-60 minutes before each meal 02/04/16  Yes Elayne Snare, MD  losartan (COZAAR) 25 MG tablet Take 0.5 tablets (12.5 mg total) by mouth daily. 07/11/17 10/09/17 Yes Larey Dresser, MD  nitroGLYCERIN (NITROSTAT) 0.4 MG SL tablet DISSOLVE ONE TABLET UNDER THE TONGUE EVERY 5 MINUTES AS NEEDED FOR CHEST PAIN.  DO NOT EXCEED A TOTAL OF 3 DOSES IN 15 MINUTES 07/24/17  Yes Larey Dresser, MD  QUEtiapine (SEROQUEL) 100 MG tablet Take 1 tablet by mouth daily. 08/28/17  Yes [provider]  ranolazine (RANEXA) 1000 MG SR tablet Take 500 mg by mouth 2 (two) times daily.   Yes [provider]  venlafaxine (EFFEXOR-XR) 150 MG 24 hr capsule Take 150 mg by mouth at bedtime.    Yes [provider]  Insulin Lispro Prot & Lispro (HUMALOG MIX 50/50 KWIKPEN) (50-50) 100 UNIT/ML Kwikpen 15 units at breakfast and lunch, 30 units before supper.  Take insulin 10 minutes before eating 11/05/15   Elayne Snare, MD  pantoprazole (PROTONIX) 40 MG tablet TAKE ONE TABLET BY MOUTH ONCE DAILY 07/11/16   Bensimhon, Shaune Pascal, MD    Inpatient Medications: Scheduled Meds: . allopurinol  100 mg Oral Daily  . aspirin EC  81 mg Oral Daily  . atorvastatin  80 mg Oral q1800  . clopidogrel  75 mg Oral Daily  . enoxaparin (LOVENOX) injection  30 mg Subcutaneous Q24H  . insulin aspart  0-15 Units Subcutaneous TID WC  . insulin glargine  30 Units Subcutaneous Daily  . ranolazine  500 mg Oral BID  . sodium chloride flush  3 mL Intravenous Q12H  . venlafaxine XR  150 mg Oral QHS   Continuous Infusions: . cefTRIAXone (ROCEPHIN)  IV Stopped (09/24/17 1856)   PRN Meds: acetaminophen **OR** acetaminophen, ondansetron **OR** ondansetron (ZOFRAN) IV  Allergies:    Allergies  Allergen Reactions  . Codeine Nausea And Vomiting  . Latex Rash    Specifies adhesive     Social  History:   Social History   Socioeconomic History  . Marital status: Married    Spouse name: Not on file  . Number of children: Not on file  . Years of education: Not on file  . Highest education level: Not on file  Occupational History  . Not on file  Social Needs  . Financial resource strain: Not on file  . Food insecurity:    Worry: Not on file  Inability: Not on file  . Transportation needs:    Medical: Not on file    Non-medical: Not on file  Tobacco Use  . Smoking status: Former Smoker    Packs/day: 0.50    Years: 15.00    Pack years: 7.50    Types: Cigarettes    Last attempt to quit: 04/17/1969    Years since quitting: 48.4  . Smokeless tobacco: Former Systems developer    Quit date: 05/05/1971  Substance and Sexual Activity  . Alcohol use: Yes    Alcohol/week: 0.6 oz    Types: 1 Cans of beer per week    Comment: occasional  . Drug use: No  . Sexual activity: Never  Lifestyle  . Physical activity:    Days per week: Not on file    Minutes per session: Not on file  . Stress: Not on file  Relationships  . Social connections:    Talks on phone: Not on file    Gets together: Not on file    Attends religious service: Not on file    Active member of club or organization: Not on file    Attends meetings of clubs or organizations: Not on file    Relationship status: Not on file  . Intimate partner violence:    Fear of current or ex partner: Not on file    Emotionally abused: Not on file    Physically abused: Not on file    Forced sexual activity: Not on file  Other Topics Concern  . Not on file  Social History Narrative   Lives in [place], retired Theatre manager for [employer].  [name] collects antique metal toys     Family History:    Family History  Problem Relation Age of Onset  . Heart failure Mother   . Heart attack Mother   . Heart failure Father   . Heart attack Father   . Coronary artery disease Unknown   . Sudden Cardiac Death Brother        in  his 70's  . Heart attack Brother   . Cancer Brother       Review of Systems    General:  No chills, fever, night sweats or weight changes. Positive for fatigue and syncope.  Cardiovascular:  No chest pain, dyspnea on exertion, edema, orthopnea, palpitations, paroxysmal nocturnal dyspnea. Dermatological: No rash, lesions/masses Respiratory: No cough, dyspnea Urologic: No hematuria, dysuria Abdominal:   No nausea, vomiting, diarrhea, bright red blood per rectum, melena, or hematemesis Neurologic:  No visual changes, wkns, changes in mental status.  All other systems reviewed and are otherwise negative except as noted above.  Physical Exam/Data    Vitals:   09/25/17 0400 09/25/17 0500 09/25/17 0600 09/25/17 0811  BP: 138/64 127/60 124/70   Pulse: 70 68 72   Resp: 14 15 18    Temp: 97.7 F (36.5 C)   97.7 F (36.5 C)  TempSrc: Oral   Oral  SpO2: 94% 98% 95%   Weight:  197 lb 5 oz (89.5 kg)    Height:        Intake/Output Summary (Last 24 hours) at 09/25/2017 0853 Last data filed at 09/25/2017 0700 Gross per 24 hour  Intake 100 ml  Output 650 ml  Net -550 ml   Filed Weights   09/24/17 0140 09/24/17 1702 09/25/17 0500  Weight: 195 lb (88.5 kg) 194 lb 15.9 oz (88.5 kg) 197 lb 5 oz (89.5 kg)   Body mass index is 31.85 kg/m.  General: Pleasant Caucasian male appearing in NAD Psych: Normal affect. Neuro: Alert and oriented X 3. Moves all extremities spontaneously. HEENT: Normal  Neck: Supple without bruits or JVD. Lungs:  Resp regular and unlabored, CTA without wheezing or rales. Heart: RRR no s3, s4, or murmurs. Abdomen: Soft, non-tender, non-distended, BS + x 4.  Extremities: No clubbing, cyanosis or edema. DP/PT/Radials 2+ and equal bilaterally.  EKG:  The EKG was personally reviewed and demonstrates:  V-paced rhythm, HR 75.   Labs/Studies     Relevant CV Studies:  Coronary Stent Intervention: 07/2015  LM lesion, 85% stenosed. Post intervention with a 4.0 x  12 Synergy drug eluting stent, there is a 0% residual stenosis.  Ost Cx to Prox Cx lesion, 90% stenosed. Post intervention with balloon angioplasty (2.5 x 20 balloon), there is a 20% residual stenosis.  PCI performed with Impella hemodynamic support.   Continue dual antiplatelet therapy ideally for at least a year. [name]'ll be watched in the stepdown unit. Anticoagulation has been stopped. His groins will have to be monitored carefully. Continued management from the heart failure team.  Echocardiogram: 07/11/2017 Study Conclusions  - Left ventricle: The cavity size was mildly dilated. Wall   thickness was increased in a pattern of mild LVH. Systolic   function was moderately to severely reduced. The estimated   ejection fraction was in the range of 30% to 35%. Inferior and   inferolateral akinesis. Doppler parameters are consistent with   abnormal left ventricular relaxation (grade 1 diastolic   dysfunction). - Aortic valve: There was no stenosis. - Aorta: Mildly dilated aortic root. Aortic root dimension: 40 mm   (ED). - Mitral valve: Mildly calcified annulus. Normal thickness leaflets   . There was mild regurgitation. - Left atrium: The atrium was mildly dilated. - Right ventricle: The cavity size was normal. Pacer wire or   catheter noted in right ventricle. Systolic function was mildly   reduced. - Pulmonary arteries: No complete TR doppler jet so unable to   estimate PA systolic pressure. - Inferior vena cava: The vessel was normal in size. The   respirophasic diameter changes were in the normal range (>= 50%),   consistent with normal central venous pressure.  Impressions:  - Mildly dilated LV with mild LV hypertrophy. EF 30-35%, inferior   and inferolateral akinesis. Normal RV size with mildly decreased   systolic function. Mild mitral regurgitation.   Laboratory Data:  Chemistry Recent Labs  Lab 09/24/17 0156 09/24/17 0157 09/25/17 0600  NA 133* 135 139  K  4.0 4.2 4.0  CL 95* 97* 107  CO2  --  26 26  GLUCOSE 292* 304* 121*  BUN 41* 49* 40*  CREATININE 2.00* 2.15* 1.50*  CALCIUM  --  9.2 8.6*  GFRNONAA  --  28* 44*  GFRAA  --  33* 51*  ANIONGAP  --  12 6    Recent Labs  Lab 09/24/17 0157  PROT 7.0  ALBUMIN 3.4*  AST 19  ALT 19  ALKPHOS 80  BILITOT 0.6   Hematology Recent Labs  Lab 09/24/17 0156 09/24/17 0157 09/24/17 0836  WBC  --  8.7 9.3  RBC  --  3.80* 3.68*  HGB 11.6* 11.4* 11.1*  HCT 34.0* 35.4* 34.4*  MCV  --  93.2 93.5  MCH  --  30.0 30.2  MCHC  --  32.2 32.3  RDW  --  14.4 14.6  PLT  --  235 228   Cardiac Enzymes Recent Labs  Lab 09/24/17 0157 09/24/17 0801 09/24/17 1333  TROPONINI <0.03 <0.03 <0.03    Recent Labs  Lab 09/24/17 0155  TROPIPOC 0.02    BNP Recent Labs  Lab 09/24/17 0157  BNP 93.0    DDimer No results for input(s): DDIMER in the last 168 hours.  Radiology/Studies:  Ct Head Wo Contrast  Result Date: 09/24/2017 CLINICAL DATA:  Altered level consciousness. Patient reports syncope. EXAM: CT HEAD WITHOUT CONTRAST TECHNIQUE: Contiguous axial images were obtained from the base of the skull through the vertex without intravenous contrast. COMPARISON:  Head CT 05/01/2017 FINDINGS: Brain: Stable degree of atrophy and chronic small vessel ischemia. No intracranial hemorrhage, mass effect, or midline shift. No hydrocephalus. The basilar cisterns are patent. No evidence of territorial infarct or acute ischemia. No extra-axial or intracranial fluid collection. Vascular: Atherosclerosis of skullbase vasculature without hyperdense vessel or abnormal calcification. Skull: No fracture or focal lesion. Sinuses/Orbits: Prior mucosal thickening of left sphenoid sinus has resolved. Minimal opacification of lower right mastoid air cells. Bilateral cataract resection. Other: None. IMPRESSION: 1.  No acute intracranial abnormality. 2. Atrophy and chronic small vessel ischemia, unchanged from prior.  Electronically Signed   By: Jeb Levering M.D.   On: 09/24/2017 03:19   Nm Pulmonary Vent And Perf (v/q Scan)  Result Date: 09/24/2017 CLINICAL DATA:  77 year old male with uncertain history EXAM: NUCLEAR MEDICINE VENTILATION - PERFUSION LUNG SCAN TECHNIQUE: Ventilation images were obtained in multiple projections using inhaled aerosol Tc-2m DTPA. Perfusion images were obtained in multiple projections after intravenous injection of Tc-71m-MAA. RADIOPHARMACEUTICALS:  Thirty-one mCi of Tc-48m DTPA aerosol inhalation and 4.4 mCi Tc69m-MAA IV COMPARISON:  Chest x-ray 09/24/2017 FINDINGS: Ventilation: No focal ventilation defect. Perfusion: Attenuation artifact present on the LPO images and right lateral images,, as well as from the pacer artifact on the left chest with no defects identified. IMPRESSION: V/Q scan negative for evidence of pulmonary embolism. Electronically Signed   By: Corrie Mckusick D.O.   On: 09/24/2017 12:22   Dg Chest Portable 1 View  Result Date: 09/24/2017 CLINICAL DATA:  Syncope. EXAM: PORTABLE CHEST 1 VIEW COMPARISON:  05/11/2017 FINDINGS: Lung volumes are low. Left-sided pacemaker in place. Patient is post median sternotomy. Cardiomegaly is similar to prior exam. Bronchovascular crowding versus vascular congestion. Limited assessment of the left lung base due to soft tissue attenuation in the lung volumes. No right pleural effusion. No pneumothorax. IMPRESSION: Hypoventilatory chest. Similar cardiomegaly. Vascular congestion versus bronchovascular crowding. Electronically Signed   By: Jeb Levering M.D.   On: 09/24/2017 02:27     Assessment & Plan    1. Syncope - The patient reports having a syncopal event when going from sitting to standing while at home.  Denies any associated palpitations or chest discomfort at that time but his wife did administer SL NTG which led to a further reduction in his BP. Was not orthostatic upon admission but BP was soft at 74/59 with sitting  and 81/70 with standing. Device interrogated while in the ED and showed no significant arrhythmias or device firings.  - Creatinine was elevated to 2.15 on admission but has improved to 1.50 with administration of IV fluids. Patient now feels back to baseline. His wife reports this previously happened when Lasix was titrated several months ago. - would recommend reducing PO Lasix back to his previous baseline dosing of 60mg  BID. May take an additional 20mg  tablet for weight gain greater than 3 lbs overnight or greater than 5 lbs in one week. Has close follow-up  with the CHF Clinic scheduled next week and will need a repeat BMET at that time.  - continue Eplerenone, Coreg, and Losartan at PTA dosing.   2. CAD - s/p CABG in 1997 with re-do CABG in 2012, occluded SVG's by cath in 2015 with stenting of LM in 07/2015 with PCI of LCx performed as well -Cyclic troponin values have remained negative and EKG shows no acute ischemic changes.  - continue ASA, Plavix, Ranexa, BB, and statin therapy.   3. Chronic combined systolic and diastolic CHF/ Ischemic Cardiomyopathy - EF 30-35% by echo in 06/2017 and [name] is s/p Medtronic CRT-D which is followed by Dr. Rayann Heman. - appears euvolemic on examination today. IVF have been discontinued and would plan to restart Lasix 60mg  BID tomorrow.   4. Stage 3 CKD - creatinine elevated to 2.15 on admission (previous baseline 1.5 - 1.6). Improved back to 1.50 this morning.  - will need a repeat BMET next week following resumption of Lasix.    For questions or updates, please contact [facility] Please consult [email or web address] for contact info under Cardiology/STEMI.  Signed, Erma Heritage, PA-C 09/25/2017, 8:53 AM Pager: [phone]  The patient was seen and examined, and I agree with the history, physical exam, assessment and plan as documented above, with modifications as noted below. I have also personally reviewed all relevant documentation, old records,  labs, and both radiographic and cardiovascular studies. I have also independently interpreted old and new ECG's.  Briefly, this is a 77 yr old male with CAD, CABG (with redo), chronic combined CHF, CRT-D, and CKD stage III hospitalized for syncope. It appears Lasix was recently increased to 80 mg bid (from 60 mg bid) due to fluid accumulation. [name] has had progressive fatigue and generalized weakness over the last several days PTA. [name] weighs daily.  [name] was also given SL nitro by his wife as she was confused about what she should do.  Device interrogation showed no significant arrhythmias. Creatinine found to be elevated at 2.15 (baseline closer to 1.5 range). Troponins have been normal. Radiographic imaging was unremarkable as detailed above (head CT, VQ scan, chest xray all reviewed).  [name] was started on IV fluids and is already feeling much better. Lasix and Eplerenone have been on hold.  Creatinine down to 1.5 today, BUN down to 40 (49 yesterday).  Recommendations: Syncope appears to have been due to hypotension due to volume depletion. I would recommend reducing Lasix back to 60 mg bid (starting 09/26/17) and instructed to take an extra 20 mg for 3 lb weight gain in 24 hours/5 lbs in one week.  Replace Mg (low at 1.6). Has follow up next week with Dr. Aundra Dubin. Otherwise, continue Eplerenone, Coreg, and losartan at present outpatient dosages.  [name] is stable with respect to ischemic heart disease. Continue current outpatient medication regimen.  Can be discharged later today from my standpoint.   Kate Sable, MD, [facility]  09/25/2017 10:43 AM

## 2017-09-27 LAB — URINE CULTURE

## 2017-10-01 ENCOUNTER — Ambulatory Visit (INDEPENDENT_AMBULATORY_CARE_PROVIDER_SITE_OTHER): Payer: Medicare HMO

## 2017-10-01 DIAGNOSIS — Z95810 Presence of automatic (implantable) cardiac defibrillator: Secondary | ICD-10-CM

## 2017-10-01 DIAGNOSIS — I5022 Chronic systolic (congestive) heart failure: Secondary | ICD-10-CM

## 2017-10-01 NOTE — Progress Notes (Signed)
EPIC Encounter for ICM Monitoring  Patient Name: [name] is a 77 y.o. male Date: 10/01/2017 Primary Care Physican: Mayra Neer, MD Primary Cardiologist:McLean Electrophysiologist: Allred Dry Weight:191lbs (baseline 191 -192 lbs) Bi-V Pacing: 97.8%       Spoke with wife and patient.  Advised to complete DPR form at next office appointment.  Heart Failure questions reviewed, pt asymptomatic. Wife stated they have made changes to their diet and doing much better at limiting salt, sugar and fluid intake.  They realize the importance after this last hospitalization.  Weight stable since hospital discharge.   Hospitalization discharge 6-11 after being dx with hypotension that caused patient to fall at home.    Thoracic impedance close to normal but was abnormal suggesting fluid accumulation at time of hospitalization 09/24/2017.  Prescribed dosage: Furosemide 20 mg3tablets(60 mg total) by mouthtwo times a day.(changed at discharge 09/25/17)  Labs: 09/25/2017 Creatinine 1.50, BUN 40, Potassium 4.0, Sodium 139, EGFR 44-51 09/24/2017 Creatinine 2.15, BUN 49, Potassium 4.2, Sodium 135, EGFR 28-33 @ 1:57AM  09/24/2017 Creatinine 1.00, BUN 41, Potassium 4.0, Sodium 133 @ 1:56 AM  09/11/2017 Creatinine 1.64, BUN 33, Potassium 4.0, Sodium 135, EGFR 39-46  07/11/2017 Creatinine 1.34, BUN 20, Potassium 4.0, Sodium 140, EGFR 51-59 06/08/2017 Creatinine 1.60, BUN 28, Potassium 3.7, Sodium 135, EGFR 41-47 05/31/2017 Creatinine1.54, BUN33, Potassium4.1, Sodium134, EGFR43-50 01/25/2019Creatinine 1.49, BUN23, Potassium4.7, Sodium134, MGNO03-70  01/18/2019Creatinine 1.65, BUN42, Potassium4.5, Sodium131, [id number]  01/17/2019Creatinine1.99, BUN66, Potassium4.1, Sodium136, [id number]  01/16/2019Creatinine 2.87, BUN90, Potassium4.7, Sodium131, [id number]  01/15/2019Creatinine 3.00, BUN79, Potassium5.2, Sodium132 03/23/2017 Creatinine 1.44, BUN 21, Potassium 4.1,  Sodium 139, EGFR 46-54 01/16/2017 Creatinine 1.28, BUN 18, Potassium 3.5, Sodium 137, EGFR 53->60 09/21/2016 Creatinine 1.49, BUN 27, Potassium 4.1, Sodium 135, EGFR 44-52 09/14/2016 Creatinine 1.62, BUN 36, Potassium 4.5, Sodium 135, EGFR 42-51 09/12/2016 Creatinine 1.74, BUN 38, Potassium 5.0, Sodium 135, EGFR 37-43 08/29/2016 Creatinine 1.30, BUN 17, Potassium 2.9, Sodium 139, EGFR 53->60, BNP 391.5 06/14/2016 Creatinine 1.43, BUN 26, Potassium 4.0, Sodium 131, EGFR 47-55 05/25/2016 Creatinine 1.39, BUN 20, Potassium 3.7, Sodium 137, EGFR 49-56  Recommendations:  Provided positive reinforcement for change Reinforced fluid restriction to < 2 L daily and sodium restriction to less than 2000 mg daily.  Encouraged to call for fluid symptoms.  Follow-up plan: ICM clinic phone appointment on 11/01/2017.  Office appointment scheduled 10/04/2017 with Dr. Aundra Dubin.  Copy of ICM check sent to Dr. Rayann Heman.   3 month ICM trend: 10/01/2017    1 Year ICM trend:       [name], RN 10/01/2017 1:55 PM

## 2017-10-04 ENCOUNTER — Ambulatory Visit (HOSPITAL_COMMUNITY)
Admission: RE | Admit: 2017-10-04 | Discharge: 2017-10-04 | Disposition: A | Discharge status: Home / Self Care | Payer: Medicare HMO | Source: Ambulatory Visit | Attending: Cardiology | Admitting: Cardiology

## 2017-10-04 VITALS — BP 118/70 | HR 68 | Wt 192.8 lb

## 2017-10-04 DIAGNOSIS — F329 Major depressive disorder, single episode, unspecified: Secondary | ICD-10-CM | POA: Insufficient documentation

## 2017-10-04 DIAGNOSIS — Z7901 Long term (current) use of anticoagulants: Secondary | ICD-10-CM | POA: Insufficient documentation

## 2017-10-04 DIAGNOSIS — Z79899 Other long term (current) drug therapy: Secondary | ICD-10-CM | POA: Insufficient documentation

## 2017-10-04 DIAGNOSIS — E785 Hyperlipidemia, unspecified: Secondary | ICD-10-CM | POA: Insufficient documentation

## 2017-10-04 DIAGNOSIS — I255 Ischemic cardiomyopathy: Secondary | ICD-10-CM | POA: Insufficient documentation

## 2017-10-04 DIAGNOSIS — Z8249 Family history of ischemic heart disease and other diseases of the circulatory system: Secondary | ICD-10-CM | POA: Diagnosis not present

## 2017-10-04 DIAGNOSIS — Z794 Long term (current) use of insulin: Secondary | ICD-10-CM | POA: Diagnosis not present

## 2017-10-04 DIAGNOSIS — Z951 Presence of aortocoronary bypass graft: Secondary | ICD-10-CM | POA: Insufficient documentation

## 2017-10-04 DIAGNOSIS — I2582 Chronic total occlusion of coronary artery: Secondary | ICD-10-CM | POA: Diagnosis not present

## 2017-10-04 DIAGNOSIS — I5022 Chronic systolic (congestive) heart failure: Secondary | ICD-10-CM | POA: Diagnosis not present

## 2017-10-04 DIAGNOSIS — K219 Gastro-esophageal reflux disease without esophagitis: Secondary | ICD-10-CM | POA: Insufficient documentation

## 2017-10-04 DIAGNOSIS — I252 Old myocardial infarction: Secondary | ICD-10-CM | POA: Insufficient documentation

## 2017-10-04 DIAGNOSIS — I2510 Atherosclerotic heart disease of native coronary artery without angina pectoris: Secondary | ICD-10-CM | POA: Insufficient documentation

## 2017-10-04 DIAGNOSIS — Z7902 Long term (current) use of antithrombotics/antiplatelets: Secondary | ICD-10-CM | POA: Diagnosis not present

## 2017-10-04 DIAGNOSIS — I5042 Chronic combined systolic (congestive) and diastolic (congestive) heart failure: Secondary | ICD-10-CM | POA: Diagnosis not present

## 2017-10-04 DIAGNOSIS — Z7982 Long term (current) use of aspirin: Secondary | ICD-10-CM | POA: Insufficient documentation

## 2017-10-04 DIAGNOSIS — Z87891 Personal history of nicotine dependence: Secondary | ICD-10-CM | POA: Insufficient documentation

## 2017-10-04 DIAGNOSIS — N183 Chronic kidney disease, stage 3 (moderate): Secondary | ICD-10-CM | POA: Diagnosis not present

## 2017-10-04 DIAGNOSIS — M109 Gout, unspecified: Secondary | ICD-10-CM | POA: Diagnosis not present

## 2017-10-04 DIAGNOSIS — Z9049 Acquired absence of other specified parts of digestive tract: Secondary | ICD-10-CM | POA: Insufficient documentation

## 2017-10-04 DIAGNOSIS — E1122 Type 2 diabetes mellitus with diabetic chronic kidney disease: Secondary | ICD-10-CM | POA: Insufficient documentation

## 2017-10-04 LAB — BASIC METABOLIC PANEL
ANION GAP: 10 (ref 5–15)
BUN: 40 mg/dL — ABNORMAL HIGH (ref 6–20)
CO2: 27 mmol/L (ref 22–32)
Calcium: 9.6 mg/dL (ref 8.9–10.3)
Chloride: 101 mmol/L (ref 101–111)
Creatinine, Ser: 1.7 mg/dL — ABNORMAL HIGH (ref 0.61–1.24)
GFR calc Af Amer: 44 mL/min — ABNORMAL LOW (ref >60)
GFR, EST NON AFRICAN AMERICAN: 38 mL/min — AB (ref >60)
GLUCOSE: 94 mg/dL (ref 65–99)
POTASSIUM: 3.9 mmol/L (ref 3.5–5.1)
Sodium: 138 mmol/L (ref 135–145)

## 2017-10-04 NOTE — Progress Notes (Signed)
ReDS Vest - 10/04/17 1200      ReDS Vest   MR   No    Estimated volume prior to reading  Low    Fitting Posture  Standing    Height Marker  Tall    Ruler Value  [place]    ReDS Value  24

## 2017-10-04 NOTE — Patient Instructions (Signed)
Continue to stay OFF the Losartan  Your physician recommends that you schedule a follow-up appointment in: 3 months

## 2017-10-04 NOTE — Progress Notes (Signed)
Advanced Heart Failure Clinic Note   Patient ID: [name], male   DOB: 03/20/1941, 77 y.o.   MRN: [id number] PCP: Dr. Lynnda Child Cardiology: Dr Aundra Dubin  59 y.o. with history of CAD s/p CABG and redo CABG as well as ischemic cardiomyopathy with CRT-D device presents for followup of CHF and CAD.  He had a Cardiolite in the 4/15 showing lateral wall ischemia.  LHC at that time showed all his vein grafts from both CABG surgeries occluded.  The LIMA-LAD was patent and there was a 90% distal LM stenosis.  The lateral ischemia likely corresponded to LCx territory downstream from the LM stenosis.  PCI of the distal LM was thought to be high risk and characteristics of the lesion were not favorable for PCI.  Last echo showed EF 25-30% with moderate RV systolic dysfunction.  He had RHC in 11/15 with normal filling pressures and relatively preserved cardiac index (2.55).    In 4/17, he was admitted with chest pain concerning for unstable angina.  Angiography showed 85% distal LM with 90% ostial LCx stenosis.  LIMA was patent but proximal LAD, proximal RCA, and all SVGs were totally occluded. High had successful DES to LM and PTCA to ostial LCx with Impella support.  [name] was stopped and he was put back on low dose lisinopril due to symptomatic hypotension.  He had Cardiolite in 11/17 with scar, no ischemia.   He was admitted in 1/19 with AKI, creatinine up to 3. Meds held then restarted.   Echo 3/19 with EF 30-35%.   He was admitted with syncope and AKI in 6/19.  He was thought to be dehydrated and orthostatic. Losartan stopped and he remains off it.   He returns for followup of CHF and CAD.  He seems to be doing well.  Weight down 3 lbs.  No exertional dyspnea or chest pain.  He mows his yard.  No lightheadedness or syncope.   REDS vest reading 24%  Medtronic device interrogation: Fluid index < threshold, impedance high.  Possible rare short atrial fibrillation, > 99% BiV pacing  Labs (9/15): LDL  particle number [id number], LDL 57, LFTs normal Labs (10/15): K 4.4, creatinine 1.4 Labs (11/15): K 4.3, creatinine 1.36 Labs (12/15): K 4.8, creatinine 1.34 Labs (3/16): K 4, creatinine 1.38, LDL 46, HDl 35 Labs (7/16): K 4, creatinine 1.39, BNP 211 Labs (03/03/15): K 4.1, creatinine 1.47, BNP 227, LDL 80, HDL 32 Labs (12/16): K 4.6, creatinine 1.12, LDL 68, HDL 34 Labs (4/17): K 3.4, creatinine 1.15 Labs (6/17): K 3.8, creatinine 1.67 Labs (7/17): K 4, creatinine 1.35 Labs (10/17): K 3.9, creatinine 1.31, LDL 45, HDL 30 Labs (2/18): K 4, creatinine 1.43 Labs (5/18): LDL 44, HDL 27 Labs (6/18): K 4.1, creatinine 1.49 Labs (12/18): K 4.4, creatinine 1.44 Labs (1/19): K 4.3, creatinine 1.48 Labs (2/19): K 3.7, creatinine 1.6 Labs (6/19): K 4, creatinine 1.5  PMH: 1. Gout 2. Hyperlipidemia 3. CAD: CABG 1997 and redo 11/12.   - LHC (4/15) with totally occluded LAD, totally occluded RCA, 80% distal LM, 50% mLCx, 2 SVG-RCA grafts totally occluded, 2 SVG-OM grafts totally occluded, patent LIMA-LAD.  Cardiolite prior to 4/15 cath showed lateral wall ischemia (LCx territory).  PCI to distal LM would be a high risk procedure.   - Cardiolite (8/16) with EF 20%, severe scar in RCA and probably LCx territory, minimal peri-infarct ischemia.   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  - Unstable angina 4/17:  85% distal LM with 90% ostial LCx stenosis.  LIMA was patent but proximal LAD, proximal RCA, and all SVGs were totally occluded. He had successful DES to LM and PTCA to ostial LCx with Impella support  - Cardiolite (11/17): EF 20%, prior MI, no ischemia.  4. Ischemic Cardiomyopathy: Medtronic CRT-D device.   - Echo (6/15) with EF 25-30%, moderate LV dilation, inferior and inferolateral akinesis, moderately decreased RV systolic function, mild MR.   - RHC (11/15) with mean RA 5, PA 23/6, mean PCWP 9, CI 2.55.  - Echo (4/17): EF 25-30% with mild MR.  - Echo (11/17): EF  25-30%, moderately dilated LV, grade II diastolic dysfunction, mild-moderate MR, mildly decreased RV systolic function.  - Echo (3/19): EF 30-35%, inferior/inferolateral akinesis, normal RV size with mildly decreased systolic function.  5. H/o cholecystectomy 6. OA 7. Depression 8. Type II diabetes 9. GERD 10. CKD  SH: Married, prior smoker (many years ago), lives in [place].    FH: CAD  ROS: All systems reviewed and negative except as per HPI.   Current Outpatient Medications  Medication Sig Dispense Refill  . allopurinol (ZYLOPRIM) 100 MG tablet Take 100 mg by mouth daily.     Marland Kitchen aspirin EC 81 MG tablet Take 81 mg by mouth daily.    Marland Kitchen atorvastatin (LIPITOR) 80 MG tablet TAKE 1 TABLET BY MOUTH DAILY AT 6:00 PM 90 tablet 6  . carvedilol (COREG) 25 MG tablet TAKE 1/2 (ONE-HALF) TABLET BY MOUTH TWICE DAILY WITH A MEAL 30 tablet 3  . clopidogrel (PLAVIX) 75 MG tablet Take 1 tablet (75 mg total) by mouth daily. 90 tablet 3  . eplerenone (INSPRA) 25 MG tablet Take 1 tablet (25 mg total) by mouth daily. 30 tablet 3  . furosemide (LASIX) 20 MG tablet Take 3 tablets (60 mg total) by mouth 2 (two) times daily. 180 tablet 0  . Insulin Glargine (TOUJEO SOLOSTAR) 300 UNIT/ML SOPN Inject 30 Units into the skin daily.    . insulin regular human CONCENTRATED (HUMULIN R U-500 KWIKPEN) 500 UNIT/ML kwikpen 30 units before BFST, 20 at lunch and 35 before supper, take 30-60 minutes before each meal 2 pen 3  . nitroGLYCERIN (NITROSTAT) 0.4 MG SL tablet DISSOLVE ONE TABLET UNDER THE TONGUE EVERY 5 MINUTES AS NEEDED FOR CHEST PAIN.  DO NOT EXCEED A TOTAL OF 3 DOSES IN 15 MINUTES 25 tablet 3  . QUEtiapine (SEROQUEL) 100 MG tablet Take 1 tablet by mouth daily.  3  . ranolazine (RANEXA) 1000 MG SR tablet Take 500 mg by mouth 2 (two) times daily.    Marland Kitchen venlafaxine (EFFEXOR-XR) 150 MG 24 hr capsule Take 150 mg by mouth at bedtime.     Marland Kitchen losartan (COZAAR) 25 MG tablet Take 0.5 tablets (12.5 mg total) by mouth  daily. (Patient not taking: Reported on 10/04/2017) 15 tablet 3  . pantoprazole (PROTONIX) 40 MG tablet TAKE ONE TABLET BY MOUTH ONCE DAILY (Patient not taking: Reported on 10/04/2017) 30 tablet 11   No current facility-administered medications for this encounter.    BP 118/70   Pulse 68   Wt 192 lb 12.8 oz (87.5 kg)   SpO2 95%   BMI 31.12 kg/m    Wt Readings from Last 3 Encounters:  10/04/17 192 lb 12.8 oz (87.5 kg)  09/25/17 197 lb 5 oz (89.5 kg)  07/11/17 195 lb 6.4 oz (88.6 kg)    General: NAD Neck: No JVD, no thyromegaly or thyroid nodule.  Lungs: Clear to auscultation bilaterally  with normal respiratory effort. CV: Nondisplaced PMI.  Heart regular S1/S2, no S3/S4, no murmur.  No peripheral edema.  No carotid bruit.  Normal pedal pulses.  Abdomen: Soft, nontender, no hepatosplenomegaly, no distention.  Skin: Intact without lesions or rashes.  Neurologic: Alert and oriented x 3.  Psych: Normal affect. Extremities: No clubbing or cyanosis.  HEENT: Normal.   Assessment/Plan: 1. Chronic systolic CHF: Ischemic cardiomyopathy.  NYHA class II symptoms, stable.  EF 30-35% on echo 3/19. He has a Medtronic CRT-D device, >99% BiV pacing today.  NYHA class II symptoms.  He does not look volume overloaded by exam, REDS vest, or Optivol. - I will continue Lasix at 60 mg bid. BMET today.   - Continue eplerenone 25 mg daily.  - BP did not tolerate Entresto.  Had ACEI cough.  Stay off losartan for now with recent orthostasis and AKI.  - Continue Coreg 12.5 mg bid. 2. CAD: s/p CABG and redo. Had DES to distal LM, angioplasty to ostial LCx in 4/17. Cardiolite 11/17 with scar, no ischemia.  No recent chest pain.  - Continue ASA 81, Plavix, statin.  - Continue Coreg and ranolazine 500 mg bid.  3. CKD stage III: Recent AKI with improvement of creatinine. BMET today.  4. Hyperlipidemia: Continue statin.      Followup in 3 months.   [name] 10/04/2017

## 2017-10-15 DIAGNOSIS — N179 Acute kidney failure, unspecified: Secondary | ICD-10-CM | POA: Diagnosis not present

## 2017-10-24 ENCOUNTER — Other Ambulatory Visit (HOSPITAL_COMMUNITY): Payer: Self-pay | Admitting: Cardiology

## 2017-10-25 ENCOUNTER — Other Ambulatory Visit: Payer: Self-pay | Admitting: Cardiology

## 2017-11-01 ENCOUNTER — Ambulatory Visit (INDEPENDENT_AMBULATORY_CARE_PROVIDER_SITE_OTHER): Payer: Medicare HMO

## 2017-11-01 DIAGNOSIS — I5042 Chronic combined systolic (congestive) and diastolic (congestive) heart failure: Secondary | ICD-10-CM | POA: Diagnosis not present

## 2017-11-01 DIAGNOSIS — Z95810 Presence of automatic (implantable) cardiac defibrillator: Secondary | ICD-10-CM

## 2017-11-01 NOTE — Progress Notes (Signed)
EPIC Encounter for ICM Monitoring  Patient Name: [name] is a 77 y.o. male Date: 11/01/2017 Primary Care Physican: Mayra Neer, MD Primary Cardiologist:McLean Electrophysiologist: Allred Dry Weight:192lbs (baseline 191 -192 lbs) Bi-V Pacing: 97.8%  Battery: 2.62 V       Heart Failure questions reviewed, pt asymptomatic.  Explained to patient and wife what to expect when battery replacement alert occurs.  Mailing DPR form.   Thoracic impedance normal.  Prescribed dosage: Furosemide 20 mg3tablets(60 mg total) by mouthtwo times a day.  Labs: 10/04/2017 Creatinine 1.70, BUN 40, Potassium 3.9, Sodium 138, EGFR 38-44 09/25/2017 Creatinine 1.50, BUN 40, Potassium 4.0, Sodium 139, EGFR 44-51 09/24/2017 Creatinine 2.15, BUN 49, Potassium 4.2, Sodium 135, EGFR 28-33 @ 1:57AM  09/24/2017 Creatinine 1.00, BUN 41, Potassium 4.0, Sodium 133 @ 1:56 AM  09/11/2017 Creatinine 1.64, BUN 33, Potassium 4.0, Sodium 135, EGFR 39-46  07/11/2017 Creatinine 1.34, BUN 20, Potassium 4.0, Sodium 140, EGFR 51-59 06/08/2017 Creatinine 1.60, BUN 28, Potassium 3.7, Sodium 135, EGFR 41-47 05/31/2017 Creatinine1.54, BUN33, Potassium4.1, Sodium134, EGFR43-50 01/25/2019Creatinine 1.49, BUN23, Potassium4.7, Sodium134, [id number]  01/18/2019Creatinine 1.65, BUN42, Potassium4.5, Sodium131, VQOH00-97  01/17/2019Creatinine1.99, BUN66, Potassium4.1, Sodium136, [id number]  01/16/2019Creatinine 2.87, BUN90, Potassium4.7, Sodium131, UVHA68-93   Recommendations: No changes.  Encouraged to call for fluid symptoms.  Follow-up plan: ICM clinic phone appointment on 12/03/2017.    Copy of ICM check sent to Dr. Rayann Heman.   3 month ICM trend: 11/01/2017    1 Year ICM trend:       [name], RN 11/01/2017 10:31 AM

## 2017-11-02 DIAGNOSIS — I130 Hypertensive heart and chronic kidney disease with heart failure and stage 1 through stage 4 chronic kidney disease, or unspecified chronic kidney disease: Secondary | ICD-10-CM | POA: Diagnosis not present

## 2017-11-02 DIAGNOSIS — N179 Acute kidney failure, unspecified: Secondary | ICD-10-CM | POA: Diagnosis not present

## 2017-11-09 DIAGNOSIS — H524 Presbyopia: Secondary | ICD-10-CM | POA: Diagnosis not present

## 2017-12-03 ENCOUNTER — Ambulatory Visit (INDEPENDENT_AMBULATORY_CARE_PROVIDER_SITE_OTHER): Payer: Medicare HMO | Admitting: *Deleted

## 2017-12-03 ENCOUNTER — Ambulatory Visit (INDEPENDENT_AMBULATORY_CARE_PROVIDER_SITE_OTHER): Payer: Medicare HMO

## 2017-12-03 DIAGNOSIS — I5042 Chronic combined systolic (congestive) and diastolic (congestive) heart failure: Secondary | ICD-10-CM

## 2017-12-03 DIAGNOSIS — Z95810 Presence of automatic (implantable) cardiac defibrillator: Secondary | ICD-10-CM | POA: Diagnosis not present

## 2017-12-03 DIAGNOSIS — I255 Ischemic cardiomyopathy: Secondary | ICD-10-CM

## 2017-12-03 NOTE — Progress Notes (Signed)
EPIC Encounter for ICM Monitoring  Patient Name: [name] is a 77 y.o. male Date: 12/03/2017 Primary Care Physican: Shaw, Kimberlee, MD Primary Cardiologist:McLean Electrophysiologist: Allred Dry Weight:194lbs (baseline 191 -192 lbs) Bi-V Pacing: 98.6%  Battery: 2.62 V      Heart Failure questions reviewed, pt asymptomatic.   Thoracic impedance trending just under baseline normal.  Prescribed dosage:  Furosemide20 mg3tablets(60 mg total) by mouthtwo times a day.  Labs: 10/04/2017 Creatinine 1.70, BUN 40, Potassium 3.9, Sodium 138, EGFR 38-44 09/25/2017 Creatinine1.50, BUN40, Potassium4.0, Sodium139, EGFR44-51 09/24/2017 Creatinine2.15, BUN49, Potassium4.2,Sodium 135, EGFR 28-33 @ 1:57AM  09/24/2017 Creatinine1.00, BUN41, Potassium4.0, Sodium133 @ 1:56 AM  09/11/2017 Creatinine1.64, BUN33, Potassium4.0, Sodium135, EGFR39-46 07/11/2017 Creatinine 1.34, BUN 20, Potassium 4.0, Sodium 140, EGFR 51-59 06/08/2017 Creatinine 1.60, BUN 28, Potassium 3.7, Sodium 135, EGFR 41-47 05/31/2017 Creatinine1.54, BUN33, Potassium4.1, Sodium134, EGFR43-50 01/25/2019Creatinine 1.49, BUN23, Potassium4.7, Sodium134, EGFR44-52  01/18/2019Creatinine 1.65, BUN42, Potassium4.5, Sodium131, EGFR39-46  01/17/2019Creatinine1.99, BUN66, Potassium4.1, Sodium136, EGFR31-36  01/16/2019Creatinine 2.87, BUN90, Potassium4.7, Sodium131, EGFR20-23   Recommendations: No changes.  Encouraged to call for fluid symptoms.  Follow-up plan: ICM clinic phone appointment on 01/17/2018.   Office appointment scheduled 01/08/2018 with Dr. McLean.    Copy of ICM check sent to Dr. Allred.   3 month ICM trend: 12/03/2017    1 Year ICM trend:       Laurie S Short, RN 12/03/2017 12:07 PM   

## 2017-12-04 NOTE — Progress Notes (Signed)
Remote ICD transmission.   

## 2017-12-25 ENCOUNTER — Telehealth: Payer: Self-pay

## 2017-12-25 ENCOUNTER — Telehealth: Payer: Self-pay | Admitting: Cardiology

## 2017-12-25 NOTE — Telephone Encounter (Signed)
Patient wife called and stated that pt heard an alert tone from his device. Pt sent a remote transmission. Pt device reached RRT on 12-23-17. I instructed pt wife that pt will get a call from a scheduler to schedule an appt w/ MD / NP / PA. At this appt they will discuss the procedure and the procedure will be scheduled for another day. Pt wife verbalized understanding.

## 2017-12-25 NOTE — Telephone Encounter (Signed)
Spoke with pt's wife informed her that the tone they heard this morning will continue to alarm until pt is seen in office. Pts wife stated that if the tone starts to bother the [name] they will call back. Ms. [name] is aware of the apt on 01/28/2018 with Dr. Rayann Heman.

## 2017-12-28 LAB — CUP PACEART REMOTE DEVICE CHECK
Battery Voltage: 2.62 V
Brady Statistic AP VP Percent: 1.67 %
Brady Statistic AS VP Percent: 97.88 %
Brady Statistic RA Percent Paced: 1.67 %
Date Time Interrogation Session: 20190819052203
HIGH POWER IMPEDANCE MEASURED VALUE: 40 Ohm
HighPow Impedance: 418 Ohm
HighPow Impedance: 53 Ohm
Implantable Lead Implant Date: 20111003
Implantable Lead Location: 753859
Implantable Lead Location: 753860
Implantable Lead Model: 4196
Implantable Lead Model: 5076
Implantable Pulse Generator Implant Date: 20130509
Lead Channel Impedance Value: 399 Ohm
Lead Channel Impedance Value: 456 Ohm
Lead Channel Impedance Value: 665 Ohm
Lead Channel Pacing Threshold Amplitude: 1.875 V
Lead Channel Pacing Threshold Pulse Width: 0.4 ms
Lead Channel Pacing Threshold Pulse Width: 0.4 ms
Lead Channel Pacing Threshold Pulse Width: 0.4 ms
Lead Channel Sensing Intrinsic Amplitude: 1.5 mV
Lead Channel Sensing Intrinsic Amplitude: 31.625 mV
Lead Channel Setting Pacing Amplitude: 2.5 V
Lead Channel Setting Pacing Amplitude: 2.75 V
Lead Channel Setting Pacing Pulse Width: 0.4 ms
Lead Channel Setting Sensing Sensitivity: 0.3 mV
MDC IDC LEAD IMPLANT DT: 20111003
MDC IDC LEAD IMPLANT DT: 20130509
MDC IDC LEAD LOCATION: 753858
MDC IDC MSMT LEADCHNL LV IMPEDANCE VALUE: 399 Ohm
MDC IDC MSMT LEADCHNL LV PACING THRESHOLD AMPLITUDE: 0.875 V
MDC IDC MSMT LEADCHNL RA SENSING INTR AMPL: 1.5 mV
MDC IDC MSMT LEADCHNL RV IMPEDANCE VALUE: 513 Ohm
MDC IDC MSMT LEADCHNL RV PACING THRESHOLD AMPLITUDE: 0.625 V
MDC IDC MSMT LEADCHNL RV SENSING INTR AMPL: 31.625 mV
MDC IDC SET LEADCHNL LV PACING AMPLITUDE: 2 V
MDC IDC SET LEADCHNL RV PACING PULSEWIDTH: 0.4 ms
MDC IDC STAT BRADY AP VS PERCENT: 0.01 %
MDC IDC STAT BRADY AS VS PERCENT: 0.44 %
MDC IDC STAT BRADY RV PERCENT PACED: 98.62 %

## 2018-01-06 ENCOUNTER — Emergency Department (HOSPITAL_COMMUNITY)
Admission: EM | Admit: 2018-01-06 | Discharge: 2018-01-06 | Disposition: A | Discharge status: Home / Self Care | Payer: Medicare HMO | Attending: Emergency Medicine | Admitting: Emergency Medicine

## 2018-01-06 ENCOUNTER — Emergency Department (HOSPITAL_COMMUNITY): Payer: Medicare HMO

## 2018-01-06 ENCOUNTER — Other Ambulatory Visit: Payer: Self-pay

## 2018-01-06 ENCOUNTER — Telehealth: Payer: Self-pay | Admitting: Cardiology

## 2018-01-06 ENCOUNTER — Encounter (HOSPITAL_COMMUNITY): Payer: Self-pay | Admitting: *Deleted

## 2018-01-06 DIAGNOSIS — Z7982 Long term (current) use of aspirin: Secondary | ICD-10-CM | POA: Insufficient documentation

## 2018-01-06 DIAGNOSIS — I5042 Chronic combined systolic (congestive) and diastolic (congestive) heart failure: Secondary | ICD-10-CM | POA: Diagnosis not present

## 2018-01-06 DIAGNOSIS — T82198A Other mechanical complication of other cardiac electronic device, initial encounter: Secondary | ICD-10-CM | POA: Insufficient documentation

## 2018-01-06 DIAGNOSIS — Z79899 Other long term (current) drug therapy: Secondary | ICD-10-CM | POA: Insufficient documentation

## 2018-01-06 DIAGNOSIS — R0602 Shortness of breath: Secondary | ICD-10-CM | POA: Insufficient documentation

## 2018-01-06 DIAGNOSIS — I130 Hypertensive heart and chronic kidney disease with heart failure and stage 1 through stage 4 chronic kidney disease, or unspecified chronic kidney disease: Secondary | ICD-10-CM | POA: Diagnosis not present

## 2018-01-06 DIAGNOSIS — E1122 Type 2 diabetes mellitus with diabetic chronic kidney disease: Secondary | ICD-10-CM | POA: Insufficient documentation

## 2018-01-06 DIAGNOSIS — Y712 Prosthetic and other implants, materials and accessory cardiovascular devices associated with adverse incidents: Secondary | ICD-10-CM | POA: Diagnosis not present

## 2018-01-06 DIAGNOSIS — N183 Chronic kidney disease, stage 3 (moderate): Secondary | ICD-10-CM | POA: Insufficient documentation

## 2018-01-06 DIAGNOSIS — Z87891 Personal history of nicotine dependence: Secondary | ICD-10-CM | POA: Insufficient documentation

## 2018-01-06 DIAGNOSIS — I2510 Atherosclerotic heart disease of native coronary artery without angina pectoris: Secondary | ICD-10-CM | POA: Diagnosis not present

## 2018-01-06 DIAGNOSIS — Z91040 Latex allergy status: Secondary | ICD-10-CM | POA: Diagnosis not present

## 2018-01-06 DIAGNOSIS — Z794 Long term (current) use of insulin: Secondary | ICD-10-CM | POA: Diagnosis not present

## 2018-01-06 DIAGNOSIS — Z7902 Long term (current) use of antithrombotics/antiplatelets: Secondary | ICD-10-CM | POA: Diagnosis not present

## 2018-01-06 DIAGNOSIS — T829XXA Unspecified complication of cardiac and vascular prosthetic device, implant and graft, initial encounter: Secondary | ICD-10-CM

## 2018-01-06 DIAGNOSIS — T82128A Displacement of other cardiac electronic device, initial encounter: Secondary | ICD-10-CM | POA: Diagnosis not present

## 2018-01-06 LAB — CBC WITH DIFFERENTIAL/PLATELET
BASOS PCT: 0 %
Basophils Absolute: 0 10*3/uL (ref 0.0–0.1)
Eosinophils Absolute: 0.2 10*3/uL (ref 0.0–0.7)
Eosinophils Relative: 3 %
HEMATOCRIT: 41 % (ref 39.0–52.0)
HEMOGLOBIN: 13.4 g/dL (ref 13.0–17.0)
Lymphocytes Relative: 20 %
Lymphs Abs: 1.5 10*3/uL (ref 0.7–4.0)
MCH: 30.9 pg (ref 26.0–34.0)
MCHC: 32.7 g/dL (ref 30.0–36.0)
MCV: 94.5 fL (ref 78.0–100.0)
Monocytes Absolute: 0.8 10*3/uL (ref 0.1–1.0)
Monocytes Relative: 11 %
NEUTROS ABS: 4.7 10*3/uL (ref 1.7–7.7)
NEUTROS PCT: 66 %
Platelets: 234 10*3/uL (ref 150–400)
RBC: 4.34 MIL/uL (ref 4.22–5.81)
RDW: 15.1 % (ref 11.5–15.5)
WBC: 7.2 10*3/uL (ref 4.0–10.5)

## 2018-01-06 LAB — COMPREHENSIVE METABOLIC PANEL
ALBUMIN: 3.6 g/dL (ref 3.5–5.0)
ALK PHOS: 107 U/L (ref 38–126)
ALT: 19 U/L (ref 0–44)
ANION GAP: 11 (ref 5–15)
AST: 18 U/L (ref 15–41)
BILIRUBIN TOTAL: 0.8 mg/dL (ref 0.3–1.2)
BUN: 30 mg/dL — ABNORMAL HIGH (ref 8–23)
CALCIUM: 9.1 mg/dL (ref 8.9–10.3)
CO2: 28 mmol/L (ref 22–32)
Chloride: 101 mmol/L (ref 98–111)
Creatinine, Ser: 1.6 mg/dL — ABNORMAL HIGH (ref 0.61–1.24)
GFR calc non Af Amer: 41 mL/min — ABNORMAL LOW (ref >60)
GFR, EST AFRICAN AMERICAN: 47 mL/min — AB (ref >60)
Glucose, Bld: 240 mg/dL — ABNORMAL HIGH (ref 70–99)
Potassium: 4.4 mmol/L (ref 3.5–5.1)
SODIUM: 140 mmol/L (ref 135–145)
TOTAL PROTEIN: 7.3 g/dL (ref 6.5–8.1)

## 2018-01-06 LAB — MAGNESIUM: Magnesium: 1.8 mg/dL (ref 1.7–2.4)

## 2018-01-06 LAB — TROPONIN I: Troponin I: <0.03 ng/mL (ref <0.03)

## 2018-01-06 NOTE — ED Notes (Signed)
Medtronic transmission sent

## 2018-01-06 NOTE — Telephone Encounter (Signed)
Pt's wife called in reporting his ICD was making a beeping noise around 8am this morning. They know that the battery is low. Then reports about an hour later at 9am his ICD fired and shocked him. Patient stated it knocked him down. Now is feeling short of breath, and not at his baseline line. No chest pain. I advised that they seek treatment at the closest ER given the ICD shock and shortness of breath. Wife was agreeable and voiced understanding.   Reino Bellis NP

## 2018-01-06 NOTE — Discharge Instructions (Signed)
Your testing has been unremarkable and shows no signs of heart attack You MUST follow up this week and pursue a battery change in your pacemaker It did NOT fire today - if it does you will likely feel severe pain in your chest like being "kicked" - if that occurs return to the ER

## 2018-01-06 NOTE — ED Triage Notes (Signed)
Pt states his defibrillator fired off and shocked him once

## 2018-01-06 NOTE — ED Provider Notes (Signed)
[facility] EMERGENCY DEPARTMENT Provider Note   CSN: [id number] Arrival date & time: 01/06/18  8546     History   Chief Complaint Chief Complaint  Patient presents with  . Pacemaker Problem  . Shortness of Breath    HPI [name] is a 77 y.o. male.  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  He has known paroxysmal atrial fibrillation, he is on aspirin, Plavix, Cozaar, insulin, Lasix, Ranexa as well as other medications for other comorbid conditions.  He reports that he was awakened this morning with some increased shortness of breath than usual.  He was out laying on the couch and when he tried to get up off the couch he felt acute onset of a sudden pain in his chest which was very brief, acute in onset and he states he felt like it was his defibrillator firing.  He has been hearing some sounds that the defibrillator has been making recently indicating that his battery is low, they have made their physician aware of this and they are planning treatment of this coming up in October.  He has had no comp occasions of this since implantation, he has never had it fire in the past.    Past Medical History:  Diagnosis Date  . CAD (coronary artery disease)    a. s/p CABG 1997 b. PCI (BMS) of SVG to RCA 3/09 c. redo CABG 02/2011 with SVG to PDA, SVG to Lcx d. Cath 07/2013: ischemic cardiomyopathy with LVEF less than 20%, occlusion of the SVG's placed in 2012 e.stenting of LM in 07/2015 with PCI of LCx performed as well  . CHF (congestive heart failure) ([place])   . Chronic systolic dysfunction of left ventricle    EF 30%  . Depression   . DJD (degenerative joint disease)   . DM (diabetes mellitus) ([place]) 02/14/2011  . GERD  (gastroesophageal reflux disease)   . Gout   . HTN (hypertension) 02/14/2011  . Hyperlipemia   . Ischemic cardiomyopathy    s/p ICD Implantation by Dr Leonia Reeves  . Paroxysmal atrial fibrillation ([place]) 10/22/2014   single episode of AF x 3 hours 48 minutes recorded on ICD, chads2vasc score is at least 5   . Renal disorder     Patient Active Problem List   Diagnosis Date Noted  . Syncope and collapse 09/24/2017  . GERD (gastroesophageal reflux disease) 09/24/2017  . Type 2 diabetes mellitus ([place]) 09/24/2017  . Chronic combined systolic and diastolic CHF (congestive heart failure) ([place]) 09/24/2017  . Acute renal failure superimposed on stage 3 chronic kidney disease ([place]) 09/24/2017  . Syncope   . Acute kidney injury ([place]) 05/01/2017  . Confusion 05/01/2017  . NSTEMI (non-ST elevated myocardial infarction) ([place])   . Unstable angina ([place])   . Chest pain with high risk of acute coronary syndrome 08/01/2015  . CKD (chronic kidney disease) stage 3, GFR 30-59 ml/min (HCC) 06/14/2015  . Angina pectoris ([place]) 02/17/2015  . Paroxysmal atrial fibrillation ([place]) 01/11/2015  . Hypersomnia 04/09/2013  . Chronic systolic CHF (congestive heart failure) ([place]) 12/12/2012  . 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 Chronic cholecystitis with calculus 01/02/2012  . Nausea vomiting and diarrhea 12/24/2011  . Dizziness 12/24/2011  . Preop cardiovascular exam 12/24/2011  .  CAD (coronary artery disease) 02/14/2011  . HTN (hypertension) 02/14/2011  . DM (diabetes mellitus) ([place]) 02/14/2011  . Ischemic cardiomyopathy 10/14/2010  . Hyperlipemia 10/14/2010    Past Surgical History:  Procedure Laterality Date  . BI-VENTRICULAR IMPLANTABLE CARDIOVERTER DEFIBRILLATOR UPGRADE N/A 08/24/2011   Procedure: BI-VENTRICULAR IMPLANTABLE CARDIOVERTER DEFIBRILLATOR UPGRADE;  Surgeon: Evans Lance, MD;  Location: [facility] CATH LAB;  Service: Cardiovascular;  Laterality: N/A;  . CARDIAC  CATHETERIZATION  08/03/2015   Procedure: Left Heart Cath and Cors/Grafts Angiography;  Surgeon: Peter M Martinique, MD;  Location: [place] CV LAB;  Service: Cardiovascular;;  . CARDIAC CATHETERIZATION N/A 08/06/2015   Procedure: Coronary Stent Intervention w/Impella;  Surgeon: Jettie Booze, MD;  Location: [place] CV LAB;  Service: Cardiovascular;  Laterality: N/A;  . CARDIAC CATHETERIZATION  08/06/2015   Procedure: Left Heart Cath;  Surgeon: Jettie Booze, MD;  Location: [place] CV LAB;  Service: Cardiovascular;;  . CARDIAC CATHETERIZATION  08/06/2015   Procedure: Coronary Balloon Angioplasty;  Surgeon: Jettie Booze, MD;  Location: [place] CV LAB;  Service: Cardiovascular;;  . CARDIAC DEFIBRILLATOR PLACEMENT  08/2004   initial placement, upgraded to Dexter ICD by Dr Lovena Le 08/24/11 (MDT)  . CATARACT EXTRACTION W/ INTRAOCULAR LENS  IMPLANT, BILATERAL  2012  . CHOLECYSTECTOMY  01/05/2012   Procedure: LAPAROSCOPIC CHOLECYSTECTOMY WITH INTRAOPERATIVE CHOLANGIOGRAM;  Surgeon: Joyice Faster. Cornett, MD;  Location: [place];  Service: General;  Laterality: N/A;  laparoscopic cholecysectoym with intraoperative cholangiogram  . COLONOSCOPY WITH PROPOFOL N/A 11/18/2013   Procedure: COLONOSCOPY WITH PROPOFOL;  Surgeon: Garlan Fair, MD;  Location: [facility] ENDOSCOPY;  Service: Endoscopy;  Laterality: N/A;  . CORONARY ANGIOPLASTY WITH STENT PLACEMENT  06/2007   BMS to SVG to RCA  . CORONARY ARTERY BYPASS GRAFT  01/1996   CABG x 5 LIMA to LAD SVG to diag1,2,svg to om,svg to RCA  . CORONARY ARTERY BYPASS GRAFT  03/06/2011   CABG X2; Procedure: REDO CORONARY ARTERY BYPASS GRAFTING (CABG);  Surgeon: Grace Isaac, MD;  Location: [place];  Service: Open Heart Surgery;  Laterality: N/A;  times two grafts using right saphenous vein harvested endoscopically.  Marland Kitchen KNEE ARTHROTOMY  ~ 1978   RIGHT KNEE CARTILAGE REMOVED  . LEFT HEART CATHETERIZATION WITH CORONARY ANGIOGRAM N/A 06/06/2011   Procedure: LEFT  HEART CATHETERIZATION WITH CORONARY ANGIOGRAM;  Surgeon: Sueanne Margarita, MD;  Location: [place] CATH LAB;  Service: Cardiovascular;  Laterality: N/A;  . LEFT HEART CATHETERIZATION WITH CORONARY/GRAFT ANGIOGRAM N/A 08/08/2013   Procedure: LEFT HEART CATHETERIZATION WITH Beatrix Fetters;  Surgeon: Sinclair Grooms, MD;  Location: [facility] CATH LAB;  Service: Cardiovascular;  Laterality: N/A;  . LUMBAR [place] SURGERY  2003  . RIGHT HEART CATHETERIZATION N/A 02/17/2014   Procedure: RIGHT HEART CATH;  Surgeon: Larey Dresser, MD;  Location: [facility] CATH LAB;  Service: Cardiovascular;  Laterality: N/A;  . VENOGRAM N/A 06/08/2011   Procedure: VENOGRAM;  Surgeon: Thompson Grayer, MD;  Location: [facility] CATH LAB;  Service: Cardiovascular;  Laterality: N/A;        Home Medications    Prior to Admission medications   Medication Sig Start Date End Date Taking? Authorizing Provider  allopurinol (ZYLOPRIM) 100 MG tablet Take 100 mg by mouth daily.    Yes [provider]  aspirin EC 81 MG tablet Take 81 mg by mouth daily.   Yes [provider]  atorvastatin (LIPITOR) 80 MG tablet TAKE 1 TABLET BY MOUTH DAILY AT 6:00 PM 05/11/17  Yes Aundra Dubin,  Elby Showers, MD  carvedilol (COREG) 25 MG tablet TAKE 1/2 (ONE-HALF) TABLET BY MOUTH TWICE DAILY WITH A MEAL 09/20/17  Yes Larey Dresser, MD  citalopram (CELEXA) 40 MG tablet Take 40 mg by mouth daily.   Yes [provider]  clopidogrel (PLAVIX) 75 MG tablet Take 1 tablet (75 mg total) by mouth daily. 02/03/14  Yes Allred, Jeneen Rinks, MD  denosumab (PROLIA) 60 MG/ML SOSY injection Inject 60 mg into the skin every 6 (six) months.   Yes [provider]  eplerenone (INSPRA) 25 MG tablet TAKE 1 TABLET BY MOUTH ONCE DAILY 10/24/17  Yes Larey Dresser, MD  furosemide (LASIX) 20 MG tablet Take 3 tablets (60 mg total) by mouth 2 (two) times daily. 09/25/17  Yes Tat, Shanon Brow, MD  Insulin Glargine (TOUJEO SOLOSTAR) 300 UNIT/ML SOPN Inject 30 Units into the skin daily.    Yes [provider]  insulin regular human CONCENTRATED (HUMULIN R U-500 KWIKPEN) 500 UNIT/ML kwikpen 30 units before BFST, 20 at lunch and 35 before supper, take 30-60 minutes before each meal 02/04/16  Yes Elayne Snare, MD  losartan (COZAAR) 25 MG tablet Take 25 mg by mouth daily.   Yes [provider]  nitroGLYCERIN (NITROSTAT) 0.4 MG SL tablet DISSOLVE ONE TABLET UNDER THE TONGUE EVERY 5 MINUTES AS NEEDED FOR CHEST PAIN.  DO NOT EXCEED A TOTAL OF 3 DOSES IN 15 MINUTES 07/24/17  Yes Larey Dresser, MD  QUEtiapine (SEROQUEL) 100 MG tablet Take 1 tablet by mouth daily. 08/28/17  Yes [provider]  ranolazine (RANEXA) 1000 MG SR tablet Take 500 mg by mouth 2 (two) times daily.   Yes [provider]  venlafaxine (EFFEXOR-XR) 150 MG 24 hr capsule Take 150 mg by mouth at bedtime.    Yes [provider]    Family History Family History  Problem Relation Age of Onset  . Heart failure Mother   . Heart attack Mother   . Heart failure Father   . Heart attack Father   . Coronary artery disease Unknown   . Sudden Cardiac Death Brother        in his 33's  . Heart attack Brother   . Cancer Brother     Social History Social History   Tobacco Use  . Smoking status: Former Smoker    Packs/day: 0.50    Years: 15.00    Pack years: 7.50    Types: Cigarettes    Last attempt to quit: 04/17/1969    Years since quitting: 48.7  . Smokeless tobacco: Former Systems developer    Quit date: 05/05/1971  Substance Use Topics  . Alcohol use: Yes    Alcohol/week: 1.0 standard drinks    Types: 1 Cans of beer per week    Comment: occasional  . Drug use: No     Allergies   Codeine and Latex   Review of Systems Review of Systems  All other systems reviewed and are negative.    Physical Exam Updated Vital Signs BP 117/62   Pulse 62   Temp 98 F (36.7 C) (Oral)   Resp 11   Ht 1.778 m (5\' 10" )   Wt 87.5 kg   SpO2 95%   BMI 27.69 kg/m   Physical Exam    Constitutional: He appears well-developed and well-nourished. No distress.  HENT:  Head: Normocephalic and atraumatic.  Mouth/Throat: Oropharynx is clear and moist. No oropharyngeal exudate.  Eyes: Pupils are equal, round, and reactive to light. Conjunctivae and EOM  are normal. Right eye exhibits no discharge. Left eye exhibits no discharge. No scleral icterus.  Neck: Normal range of motion. Neck supple. No JVD present. No thyromegaly present.  Cardiovascular: Normal rate, regular rhythm, normal heart sounds and intact distal pulses. Exam reveals no gallop and no friction rub.  No murmur heard. Pulmonary/Chest: Effort normal and breath sounds normal. No respiratory distress. He has no wheezes. He has no rales.  Abdominal: Soft. Bowel sounds are normal. He exhibits no distension and no mass. There is no tenderness.  Musculoskeletal: Normal range of motion. He exhibits no edema or tenderness.  Lymphadenopathy:    He has no cervical adenopathy.  Neurological: He is alert. Coordination normal.  Skin: Skin is warm and dry. No rash noted. No erythema.  Psychiatric: He has a normal mood and affect. His behavior is normal.  Nursing note and vitals reviewed.    ED Treatments / Results  Labs (all labs ordered are listed, but only abnormal results are displayed) Labs Reviewed  COMPREHENSIVE METABOLIC PANEL - Abnormal; Notable for the following components:      Result Value   Glucose, Bld 240 (*)    BUN 30 (*)    Creatinine, Ser 1.60 (*)    GFR calc non Af Amer 41 (*)    GFR calc Af Amer 47 (*)    All other components within normal limits  CBC WITH DIFFERENTIAL/PLATELET  TROPONIN I  MAGNESIUM  TROPONIN I    EKG EKG Interpretation  Date/Time:  Sunday January 06 2018 10:07:12 EDT Ventricular Rate:  63 PR Interval:    QRS Duration: 147 QT Interval:  462 QTC Calculation: 473 R Axis:   -110 Text Interpretation:  A-V dual-paced rhythm with some inhibition No further analysis  attempted due to paced rhythm d/w 6/19, no changes Confirmed by Noemi Chapel [phone]) on 01/06/2018 10:16:43 AM   Radiology Dg Chest Port 1 View  Result Date: 01/06/2018 CLINICAL DATA:  Shortness of breath. Patient believes defibrillator fired. EXAM: PORTABLE CHEST 1 VIEW COMPARISON:  09/24/2017 and 05/11/2017. FINDINGS: 1031 hours. The left subclavian defibrillator leads appear unchanged. There are 2 ventricular leads. There is stable cardiomegaly status post median sternotomy and CABG. The overall pulmonary aeration has improved. There is no edema, confluent airspace opacity, pleural effusion or pneumothorax. No acute osseous findings are seen. IMPRESSION: No evidence of acute cardiopulmonary process. Stable cardiomegaly post CABG and defibrillator placement. Electronically Signed   By: Richardean Sale M.D.   On: 01/06/2018 11:02    Procedures Procedures (including critical care time)  Medications Ordered in ED Medications - No data to display   Initial Impression / Assessment and Plan / ED Course  I have reviewed the triage vital signs and the nursing notes.  Pertinent labs & imaging results that were available during my care of the patient were reviewed by me and considered in my medical decision making (see chart for details).  Clinical Course as of Jan 07 1423  Sun Jan 06, 2018  1106 Labs reviewed, creatinine is at baseline compared to prior labs.  Prior medical record reviewed.  Troponin is negative, blood counts negative, magnesium of 1.8   [BM]  1149 Care discussed with the wrap, there is not appear to be any shockable rhythms, the tracings were reviewed from the Medtronic express service, there was no shockable VF or VT episodes.  The patient has not had any further symptoms here.  His first troponin is negative, will obtain a second troponin.   [BM]  [place] negative - stable for d/c.   [BM]    Clinical Course User Index [BM] Noemi Chapel, MD    The patient's  exam is fairly unremarkable at this time, he is not apparently dyspneic, not objectively in any distress and his paced rhythm on the monitor seems to be regular at approximately 60 bpm.  Will obtain a chest x-ray, interrogate his Medtronic pacer and discuss with cardiology regarding the next step.    Final Clinical Impressions(s) / ED Diagnoses   Final diagnoses:  SOB (shortness of breath)  Pacemaker complications, initial encounter    ED Discharge Orders    None       Noemi Chapel, MD 01/06/18 1424

## 2018-01-08 ENCOUNTER — Ambulatory Visit (HOSPITAL_COMMUNITY)
Admission: RE | Admit: 2018-01-08 | Discharge: 2018-01-08 | Disposition: A | Discharge status: Home / Self Care | Payer: Medicare HMO | Source: Ambulatory Visit | Attending: Cardiology | Admitting: Cardiology

## 2018-01-08 VITALS — BP 100/58 | HR 73 | Wt 197.2 lb

## 2018-01-08 DIAGNOSIS — Z955 Presence of coronary angioplasty implant and graft: Secondary | ICD-10-CM | POA: Insufficient documentation

## 2018-01-08 DIAGNOSIS — N183 Chronic kidney disease, stage 3 unspecified: Secondary | ICD-10-CM

## 2018-01-08 DIAGNOSIS — Z79899 Other long term (current) drug therapy: Secondary | ICD-10-CM | POA: Diagnosis not present

## 2018-01-08 DIAGNOSIS — I2510 Atherosclerotic heart disease of native coronary artery without angina pectoris: Secondary | ICD-10-CM | POA: Insufficient documentation

## 2018-01-08 DIAGNOSIS — I5042 Chronic combined systolic (congestive) and diastolic (congestive) heart failure: Secondary | ICD-10-CM

## 2018-01-08 DIAGNOSIS — E785 Hyperlipidemia, unspecified: Secondary | ICD-10-CM | POA: Insufficient documentation

## 2018-01-08 DIAGNOSIS — I255 Ischemic cardiomyopathy: Secondary | ICD-10-CM | POA: Diagnosis not present

## 2018-01-08 DIAGNOSIS — K219 Gastro-esophageal reflux disease without esophagitis: Secondary | ICD-10-CM | POA: Insufficient documentation

## 2018-01-08 DIAGNOSIS — I252 Old myocardial infarction: Secondary | ICD-10-CM | POA: Insufficient documentation

## 2018-01-08 DIAGNOSIS — Z7982 Long term (current) use of aspirin: Secondary | ICD-10-CM | POA: Diagnosis not present

## 2018-01-08 DIAGNOSIS — Z951 Presence of aortocoronary bypass graft: Secondary | ICD-10-CM | POA: Insufficient documentation

## 2018-01-08 DIAGNOSIS — I5022 Chronic systolic (congestive) heart failure: Secondary | ICD-10-CM | POA: Diagnosis not present

## 2018-01-08 DIAGNOSIS — Z8249 Family history of ischemic heart disease and other diseases of the circulatory system: Secondary | ICD-10-CM | POA: Diagnosis not present

## 2018-01-08 DIAGNOSIS — Z87891 Personal history of nicotine dependence: Secondary | ICD-10-CM | POA: Insufficient documentation

## 2018-01-08 DIAGNOSIS — Z794 Long term (current) use of insulin: Secondary | ICD-10-CM | POA: Insufficient documentation

## 2018-01-08 DIAGNOSIS — Z7902 Long term (current) use of antithrombotics/antiplatelets: Secondary | ICD-10-CM | POA: Insufficient documentation

## 2018-01-08 DIAGNOSIS — E1122 Type 2 diabetes mellitus with diabetic chronic kidney disease: Secondary | ICD-10-CM | POA: Insufficient documentation

## 2018-01-08 DIAGNOSIS — M109 Gout, unspecified: Secondary | ICD-10-CM | POA: Diagnosis not present

## 2018-01-08 DIAGNOSIS — F329 Major depressive disorder, single episode, unspecified: Secondary | ICD-10-CM | POA: Diagnosis not present

## 2018-01-08 DIAGNOSIS — Z9049 Acquired absence of other specified parts of digestive tract: Secondary | ICD-10-CM | POA: Insufficient documentation

## 2018-01-08 NOTE — Patient Instructions (Signed)
Follow up in 3 months

## 2018-01-09 NOTE — Progress Notes (Signed)
Advanced Heart Failure Clinic Note   Patient ID: [name], male   DOB: 18-Sep-1940, 77 y.o.   MRN: [id number] PCP: Dr. Lynnda Child Cardiology: Dr Aundra Dubin  88 y.o. with history of CAD s/p CABG and redo CABG as well as ischemic cardiomyopathy with CRT-D device presents for followup of CHF and CAD.  He had a Cardiolite in the 4/15 showing lateral wall ischemia.  LHC at that time showed all his vein grafts from both CABG surgeries occluded.  The LIMA-LAD was patent and there was a 90% distal LM stenosis.  The lateral ischemia likely corresponded to LCx territory downstream from the LM stenosis.  PCI of the distal LM was thought to be high risk and characteristics of the lesion were not favorable for PCI.  Last echo showed EF 25-30% with moderate RV systolic dysfunction.  He had RHC in 11/15 with normal filling pressures and relatively preserved cardiac index (2.55).    In 4/17, he was admitted with chest pain concerning for unstable angina.  Angiography showed 85% distal LM with 90% ostial LCx stenosis.  LIMA was patent but proximal LAD, proximal RCA, and all SVGs were totally occluded. High had successful DES to LM and PTCA to ostial LCx with Impella support.  [name] was stopped and he was put back on low dose lisinopril due to symptomatic hypotension.  He had Cardiolite in 11/17 with scar, no ischemia.   He was admitted in 1/19 with AKI, creatinine up to 3. Meds held then restarted.   Echo 3/19 with EF 30-35%.   He was admitted with syncope and AKI in 6/19.  He was thought to be dehydrated and orthostatic. Losartan stopped but eventually restarted.   He returns for followup of CHF and CAD.  His ICD has been beeping, nearing ERI and will see Dr.  Rayann Heman on 10/14.  He thought that he was shocked by the ICD but interrogation showed no discharge and no VT, suspect the device vibrated as battery has been getting low.  He has been having trouble with his memory per his wife.  He has had no exertion chest  pain.  He has had some atypical chest pain that tends to occur when he is at rest.  He is able to walk about 1/2 mile without dyspnea.  No orthopnea/PND. No lightheadedness or falls. Weight is up but wife says he has been eating more.   Medtronic device interrogation: Fluid index < threshold, no VT, >99% BiV pacing.   Labs (9/15): LDL particle number 816, LDL 57, LFTs normal Labs (10/15): K 4.4, creatinine 1.4 Labs (11/15): K 4.3, creatinine 1.36 Labs (12/15): K 4.8, creatinine 1.34 Labs (3/16): K 4, creatinine 1.38, LDL 46, HDl 35 Labs (7/16): K 4, creatinine 1.39, BNP 211 Labs (03/03/15): K 4.1, creatinine 1.47, BNP 227, LDL 80, HDL 32 Labs (12/16): K 4.6, creatinine 1.12, LDL 68, HDL 34 Labs (4/17): K 3.4, creatinine 1.15 Labs (6/17): K 3.8, creatinine 1.67 Labs (7/17): K 4, creatinine 1.35 Labs (10/17): K 3.9, creatinine 1.31, LDL 45, HDL 30 Labs (2/18): K 4, creatinine 1.43 Labs (5/18): LDL 44, HDL 27 Labs (6/18): K 4.1, creatinine 1.49 Labs (12/18): K 4.4, creatinine 1.44 Labs (1/19): K 4.3, creatinine 1.48 Labs (2/19): K 3.7, creatinine 1.6 Labs (6/19): K 4, creatinine 1.5 Labs (9/19): K 4.4, creatinine 1.6, hgb 13.4  PMH: 1. Gout 2. Hyperlipidemia 3. CAD: CABG 1997 and redo 11/12.   - LHC (4/15) with totally occluded LAD, totally occluded RCA, 80%  distal LM, 50% mLCx, 2 SVG-RCA grafts totally occluded, 2 SVG-OM grafts totally occluded, patent LIMA-LAD.  Cardiolite prior to 4/15 cath showed lateral wall ischemia (LCx territory).  PCI to distal LM would be a high risk procedure.   - Cardiolite (8/16) with EF 20%, severe scar in RCA and probably LCx territory, minimal peri-infarct ischemia.   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  - Unstable angina 4/17: 85% distal LM with 90% ostial LCx stenosis.  LIMA was patent but proximal LAD, proximal RCA, and all SVGs were totally occluded. He had successful DES to LM and PTCA to ostial LCx with Impella  support  - Cardiolite (11/17): EF 20%, prior MI, no ischemia.  4. Ischemic Cardiomyopathy: Medtronic CRT-D device.   - Echo (6/15) with EF 25-30%, moderate LV dilation, inferior and inferolateral akinesis, moderately decreased RV systolic function, mild MR.   - RHC (11/15) with mean RA 5, PA 23/6, mean PCWP 9, CI 2.55.  - Echo (4/17): EF 25-30% with mild MR.  - Echo (11/17): EF 25-30%, moderately dilated LV, grade II diastolic dysfunction, mild-moderate MR, mildly decreased RV systolic function.  - Echo (3/19): EF 30-35%, inferior/inferolateral akinesis, normal RV size with mildly decreased systolic function.  5. H/o cholecystectomy 6. OA 7. Depression 8. Type II diabetes 9. GERD 10. CKD stage 3 11. ?Early dementia  SH: Married, prior smoker (many years ago), lives in [place].    FH: CAD  ROS: All systems reviewed and negative except as per HPI.   Current Outpatient Medications  Medication Sig Dispense Refill  . allopurinol (ZYLOPRIM) 100 MG tablet Take 100 mg by mouth daily.     Marland Kitchen aspirin EC 81 MG tablet Take 81 mg by mouth daily.    Marland Kitchen atorvastatin (LIPITOR) 80 MG tablet TAKE 1 TABLET BY MOUTH DAILY AT 6:00 PM 90 tablet 6  . carvedilol (COREG) 25 MG tablet TAKE 1/2 (ONE-HALF) TABLET BY MOUTH TWICE DAILY WITH A MEAL 30 tablet 3  . clopidogrel (PLAVIX) 75 MG tablet Take 1 tablet (75 mg total) by mouth daily. 90 tablet 3  . eplerenone (INSPRA) 25 MG tablet TAKE 1 TABLET BY MOUTH ONCE DAILY 30 tablet 3  . furosemide (LASIX) 20 MG tablet Take 3 tablets (60 mg total) by mouth 2 (two) times daily. 180 tablet 0  . Insulin Glargine (TOUJEO SOLOSTAR) 300 UNIT/ML SOPN Inject 30 Units into the skin daily.    . insulin regular human CONCENTRATED (HUMULIN R U-500 KWIKPEN) 500 UNIT/ML kwikpen 30 units before BFST, 20 at lunch and 35 before supper, take 30-60 minutes before each meal 2 pen 3  . losartan (COZAAR) 25 MG tablet Take 25 mg by mouth daily.    . nitroGLYCERIN (NITROSTAT) 0.4 MG SL  tablet DISSOLVE ONE TABLET UNDER THE TONGUE EVERY 5 MINUTES AS NEEDED FOR CHEST PAIN.  DO NOT EXCEED A TOTAL OF 3 DOSES IN 15 MINUTES 25 tablet 3  . QUEtiapine (SEROQUEL) 100 MG tablet Take 1 tablet by mouth daily.  3  . ranolazine (RANEXA) 1000 MG SR tablet Take 500 mg by mouth 2 (two) times daily.    Marland Kitchen venlafaxine (EFFEXOR-XR) 150 MG 24 hr capsule Take 150 mg by mouth at bedtime.      No current facility-administered medications for this encounter.    BP (!) 100/58   Pulse 73   Wt 89.4 kg (197 lb 3.2 oz)   SpO2 96%   BMI 28.30 kg/m    Wt Readings  from Last 3 Encounters:  01/08/18 89.4 kg (197 lb 3.2 oz)  01/06/18 87.5 kg (193 lb)  10/04/17 87.5 kg (192 lb 12.8 oz)    General: NAD Neck: No JVD, no thyromegaly or thyroid nodule.  Lungs: Clear to auscultation bilaterally with normal respiratory effort. CV: Nondisplaced PMI.  Heart regular S1/S2, no S3/S4, no murmur.  No peripheral edema.  No carotid bruit.  Normal pedal pulses.  Abdomen: Soft, nontender, no hepatosplenomegaly, no distention.  Skin: Intact without lesions or rashes.  Neurologic: Alert and oriented x 3.  Psych: Normal affect. Extremities: No clubbing or cyanosis.  HEENT: Normal.   Assessment/Plan: 1. Chronic systolic CHF: Ischemic cardiomyopathy.  NYHA class II symptoms, stable.  EF 30-35% on echo 3/19. He has a Medtronic CRT-D device, >99% BiV pacing today.  NYHA class II symptoms.  He does not look volume overloaded by exam or Optivol. - I will continue Lasix at 60 mg bid. BMET today.   - Continue eplerenone 25 mg daily.  - BP did not tolerate Entresto.  Had ACEI cough.  Continue losartan 25 mg daily.  BP soft, will not increase. - Continue Coreg 12.5 mg bid. - Device is at [facility], to see Dr. Rayann Heman on 10/14 regarding upgrade.  Of note, he did not have an ICD discharge (suspect that the device vibrated).  2. CAD: s/p CABG and redo. Had DES to distal LM, angioplasty to ostial LCx in 4/17. Cardiolite 11/17 with scar,  no ischemia.  No exertional chest pain.   - Continue ASA 81, Plavix, statin.  - Continue Coreg and ranolazine 500 mg bid.  3. CKD stage III: BMET today.   4. Hyperlipidemia: Continue statin.      Followup in 2-3 months.   [name] 01/09/2018

## 2018-01-10 ENCOUNTER — Encounter: Payer: Self-pay | Admitting: Internal Medicine

## 2018-01-17 ENCOUNTER — Ambulatory Visit: Payer: Medicare HMO

## 2018-01-17 ENCOUNTER — Other Ambulatory Visit: Payer: Self-pay | Admitting: Cardiology

## 2018-01-17 DIAGNOSIS — Z95810 Presence of automatic (implantable) cardiac defibrillator: Secondary | ICD-10-CM

## 2018-01-17 DIAGNOSIS — I5042 Chronic combined systolic (congestive) and diastolic (congestive) heart failure: Secondary | ICD-10-CM

## 2018-01-17 NOTE — Progress Notes (Signed)
EPIC Encounter for ICM Monitoring  Patient Name: [name] is a 77 y.o. male Date: 01/17/2018 Primary Care Physican: Shaw, Kimberlee, MD Primary Cardiologist:[place] Electrophysiologist: Allred Dry Weight:Previous weight 194lbs (baseline 191 -192 lbs) Bi-V Pacing: 96.4%      Attempted call to patient and unable to reach.  Left message to return call.  Transmission reviewed.    Thoracic impedance normal.  Prescribed: Furosemide20 mg3tablets(60 mg total) by mouthtwo times a day.  Labs: 01/06/2018 Creatinine 1.60, BUN 30, Potassium 4.4, Sodium 140, EGFR 41-47 10/04/2017 Creatinine 1.70, BUN 40, Potassium 3.9, Sodium 138, EGFR 38-44 09/25/2017 Creatinine1.50, BUN40, Potassium4.0, Sodium139, EGFR44-51 09/24/2017 Creatinine2.15, BUN49, Potassium4.2,Sodium 135, EGFR 28-33 @ 1:57AM  09/24/2017 Creatinine1.00, BUN41, Potassium4.0, Sodium133 @ 1:56 AM  09/11/2017 Creatinine1.64, BUN33, Potassium4.0, Sodium135, EGFR39-46 07/11/2017 Creatinine 1.34, BUN 20, Potassium 4.0, Sodium 140, EGFR 51-59 06/08/2017 Creatinine 1.60, BUN 28, Potassium 3.7, Sodium 135, EGFR 41-47 05/31/2017 Creatinine1.54, BUN33, Potassium4.1, Sodium134, EGFR43-50 01/25/2019Creatinine 1.49, BUN23, Potassium4.7, Sodium134, EGFR44-52  01/18/2019Creatinine 1.65, BUN42, Potassium4.5, Sodium131, EGFR39-46  01/17/2019Creatinine1.99, BUN66, Potassium4.1, Sodium136, EGFR31-36  01/16/2019Creatinine 2.87, BUN90, Potassium4.7, Sodium131, EGFR20-23  Recommendations: Unable to reach.  Follow-up plan: ICM clinic phone appointment on 03/04/2018.   Office appointment scheduled 01/28/2018 with Dr. Allred to discuss gen change.    Copy of ICM check sent to Dr. Allred.   3 month ICM trend: 01/17/2018    1 Year ICM trend:        S , RN 01/17/2018 9:38 AM   

## 2018-01-18 ENCOUNTER — Telehealth: Payer: Self-pay

## 2018-01-18 DIAGNOSIS — Z23 Encounter for immunization: Secondary | ICD-10-CM | POA: Diagnosis not present

## 2018-01-18 NOTE — Telephone Encounter (Signed)
Remote ICM transmission received.  Attempted call to patient and left message, per DPR, to return call.  

## 2018-01-28 ENCOUNTER — Encounter: Payer: Self-pay | Admitting: Internal Medicine

## 2018-01-28 ENCOUNTER — Ambulatory Visit: Payer: Medicare HMO | Admitting: Internal Medicine

## 2018-01-28 ENCOUNTER — Encounter

## 2018-01-28 VITALS — BP 106/64 | HR 82 | Ht 70.0 in | Wt 198.8 lb

## 2018-01-28 DIAGNOSIS — I480 Paroxysmal atrial fibrillation: Secondary | ICD-10-CM

## 2018-01-28 DIAGNOSIS — I255 Ischemic cardiomyopathy: Secondary | ICD-10-CM

## 2018-01-28 DIAGNOSIS — I5022 Chronic systolic (congestive) heart failure: Secondary | ICD-10-CM | POA: Diagnosis not present

## 2018-01-28 DIAGNOSIS — Z95810 Presence of automatic (implantable) cardiac defibrillator: Secondary | ICD-10-CM | POA: Diagnosis not present

## 2018-01-28 DIAGNOSIS — I5042 Chronic combined systolic (congestive) and diastolic (congestive) heart failure: Secondary | ICD-10-CM

## 2018-01-28 DIAGNOSIS — I10 Essential (primary) hypertension: Secondary | ICD-10-CM | POA: Diagnosis not present

## 2018-01-28 NOTE — Progress Notes (Signed)
PCP: Mayra Neer, MD Primary Cardiologist: Dr Radford Pax Primary EP: Dr Mariane Duval [name] is a 77 y.o. male who presents today for routine electrophysiology followup.  Since last being seen in our clinic, the patient reports doing very well.  Today, he denies symptoms of palpitations, chest pain, shortness of breath,  lower extremity edema, dizziness, presyncope, syncope, or ICD shocks.  The patient is otherwise without complaint today.   Past Medical History:  Diagnosis Date  . CAD (coronary artery disease)    a. s/p CABG 1997 b. PCI (BMS) of SVG to RCA 3/09 c. redo CABG 02/2011 with SVG to PDA, SVG to Lcx d. Cath 07/2013: ischemic cardiomyopathy with LVEF less than 20%, occlusion of the SVG's placed in 2012 e.stenting of LM in 07/2015 with PCI of LCx performed as well  . CHF (congestive heart failure) ([place])   . Chronic systolic dysfunction of left ventricle    EF 30%  . Depression   . DJD (degenerative joint disease)   . DM (diabetes mellitus) ([place]) 02/14/2011  . GERD (gastroesophageal reflux disease)   . Gout   . HTN (hypertension) 02/14/2011  . Hyperlipemia   . Ischemic cardiomyopathy    s/p ICD Implantation by Dr Leonia Reeves  . Paroxysmal atrial fibrillation ([place]) 10/22/2014   single episode of AF x 3 hours 48 minutes recorded on ICD, chads2vasc score is at least 5   . Renal disorder    Past Surgical History:  Procedure Laterality Date  . BI-VENTRICULAR IMPLANTABLE CARDIOVERTER DEFIBRILLATOR UPGRADE N/A 08/24/2011   Procedure: BI-VENTRICULAR IMPLANTABLE CARDIOVERTER DEFIBRILLATOR UPGRADE;  Surgeon: Evans Lance, MD;  Location: [facility] C[facility] LAB;  Service: Cardiovascular;  Laterality: N/A;  . CARDIAC CATHETERIZATION  08/03/2015   Procedure: Left Heart Cath and Cors/Grafts Angiography;  Surgeon: Peter M Martinique, MD;  Location: [place] CV LAB;  Service: Cardiovascular;;  . CARDIAC CATHETERIZATION N/A 08/06/2015   Procedure: Coronary Stent Intervention w/Impella;  Surgeon: Jettie Booze, MD;  Location: [place] CV LAB;  Service: Cardiovascular;  Laterality: N/A;  . CARDIAC CATHETERIZATION  08/06/2015   Procedure: Left Heart Cath;  Surgeon: Jettie Booze, MD;  Location: [place] CV LAB;  Service: Cardiovascular;;  . CARDIAC CATHETERIZATION  08/06/2015   Procedure: Coronary Balloon Angioplasty;  Surgeon: Jettie Booze, MD;  Location: [place] CV LAB;  Service: Cardiovascular;;  . CARDIAC DEFIBRILLATOR PLACEMENT  08/2004   initial placement, upgraded to [place] ICD by Dr Lovena Le 08/24/11 (MDT)  . CATARACT EXTRACTION W/ INTRAOCULAR LENS  IMPLANT, BILATERAL  2012  . CHOLECYSTECTOMY  01/05/2012   Procedure: LAPAROSCOPIC CHOLECYSTECTOMY WITH INTRAOPERATIVE CHOLANGIOGRAM;  Surgeon: Joyice Faster. Cornett, MD;  Location: [place];  Service: General;  Laterality: N/A;  laparoscopic cholecysectoym with intraoperative cholangiogram  . COLONOSCOPY WITH PROPOFOL N/A 11/18/2013   Procedure: COLONOSCOPY WITH PROPOFOL;  Surgeon: Garlan Fair, MD;  Location: [facility] ENDOSCOPY;  Service: Endoscopy;  Laterality: N/A;  . CORONARY ANGIOPLASTY WITH STENT PLACEMENT  06/2007   BMS to SVG to RCA  . CORONARY ARTERY BYPASS GRAFT  01/1996   CABG x 5 LIMA to LAD SVG to diag1,2,svg to om,svg to RCA  . CORONARY ARTERY BYPASS GRAFT  03/06/2011   CABG X2; Procedure: REDO CORONARY ARTERY BYPASS GRAFTING (CABG);  Surgeon: Grace Isaac, MD;  Location: [place];  Service: Open Heart Surgery;  Laterality: N/A;  times two grafts using right saphenous vein harvested endoscopically.  Marland Kitchen KNEE ARTHROTOMY  ~ 1978   RIGHT KNEE CARTILAGE  REMOVED  . LEFT HEART CATHETERIZATION WITH CORONARY ANGIOGRAM N/A 06/06/2011   Procedure: LEFT HEART CATHETERIZATION WITH CORONARY ANGIOGRAM;  Surgeon: Sueanne Margarita, MD;  Location: [place] CATH LAB;  Service: Cardiovascular;  Laterality: N/A;  . LEFT HEART CATHETERIZATION WITH CORONARY/GRAFT ANGIOGRAM N/A 08/08/2013   Procedure: LEFT HEART CATHETERIZATION WITH Beatrix Fetters;   Surgeon: Sinclair Grooms, MD;  Location: [facility] CATH LAB;  Service: Cardiovascular;  Laterality: N/A;  . LUMBAR [place] SURGERY  2003  . RIGHT HEART CATHETERIZATION N/A 02/17/2014   Procedure: RIGHT HEART CATH;  Surgeon: Larey Dresser, MD;  Location: [facility] CATH LAB;  Service: Cardiovascular;  Laterality: N/A;  . VENOGRAM N/A 06/08/2011   Procedure: VENOGRAM;  Surgeon: Thompson Grayer, MD;  Location: [facility] CATH LAB;  Service: Cardiovascular;  Laterality: N/A;    ROS- all systems are reviewed and negative except as per HPI above  Current Outpatient Medications  Medication Sig Dispense Refill  . allopurinol (ZYLOPRIM) 100 MG tablet Take 100 mg by mouth daily.     Marland Kitchen aspirin EC 81 MG tablet Take 81 mg by mouth daily.    Marland Kitchen atorvastatin (LIPITOR) 80 MG tablet TAKE 1 TABLET BY MOUTH DAILY AT 6:00 PM 90 tablet 6  . carvedilol (COREG) 25 MG tablet TAKE 1/2 (ONE-HALF) TABLET BY MOUTH TWICE DAILY WITH A MEAL 30 tablet 3  . clopidogrel (PLAVIX) 75 MG tablet Take 1 tablet (75 mg total) by mouth daily. 90 tablet 3  . eplerenone (INSPRA) 25 MG tablet TAKE 1 TABLET BY MOUTH ONCE DAILY 30 tablet 3  . furosemide (LASIX) 20 MG tablet Take 3 tablets (60 mg total) by mouth 2 (two) times daily. 180 tablet 0  . Insulin Glargine (TOUJEO SOLOSTAR) 300 UNIT/ML SOPN Inject 30 Units into the skin daily.    . insulin regular human CONCENTRATED (HUMULIN R U-500 KWIKPEN) 500 UNIT/ML kwikpen 30 units before BFST, 20 at lunch and 35 before supper, take 30-60 minutes before each meal 2 pen 3  . losartan (COZAAR) 25 MG tablet Take 25 mg by mouth daily.    . nitroGLYCERIN (NITROSTAT) 0.4 MG SL tablet DISSOLVE ONE TABLET UNDER THE TONGUE EVERY 5 MINUTES AS NEEDED FOR CHEST PAIN.  DO NOT EXCEED A TOTAL OF 3 DOSES IN 15 MINUTES 25 tablet 3  . QUEtiapine (SEROQUEL) 100 MG tablet Take 1 tablet by mouth daily.  3  . ranolazine (RANEXA) 1000 MG SR tablet Take 500 mg by mouth 2 (two) times daily.    Marland Kitchen venlafaxine (EFFEXOR-XR) 150 MG 24 hr capsule  Take 150 mg by mouth at bedtime.      No current facility-administered medications for this visit.     Physical Exam: Vitals:   01/28/18 1215  BP: 106/64  Pulse: 82  SpO2: 97%  Weight: 198 lb 12.8 oz (90.2 kg)  Height: 5\' 10"  (1.778 m)    GEN- The patient is well appearing, alert and oriented x 3 today.   Head- normocephalic, atraumatic Eyes-  Sclera clear, conjunctiva pink Ears- hearing intact Oropharynx- clear Lungs- Clear to ausculation bilaterally, normal work of breathing Chest- ICD pocket is well healed Heart- Regular rate and rhythm, no murmurs, rubs or gallops, PMI not laterally displaced GI- soft, NT, ND, + BS Extremities- no clubbing, cyanosis, or edema  ICD interrogation- reviewed in detail today,  See PACEART report  ekg tracing ordered today is personally reviewed and shows sinus wit BiV pacing, PVCs  Wt Readings from Last 3 Encounters:  01/28/18 198 lb 12.8  oz (90.2 kg)  01/08/18 197 lb 3.2 oz (89.4 kg)  01/06/18 193 lb (87.5 kg)    Assessment and Plan:  1.  Chronic systolic dysfunction/ ischemic CM/ CAD euvolemic today No ischemic symptoms Echo 07/11/17 reveals EF 30% Stable on an appropriate medical regimen BiV ICD has reached ERI See Pace Art report Risks, benefits, and alternatives to BiV ICD pulse generator replacement were discussed in detail today.  The patient understands that risks include but are not limited to bleeding, infection, pneumothorax, perforation, tamponade, vascular damage, renal failure, MI, stroke, death, inappropriate shocks, damage to his existing leads, and lead dislodgement and wishes to proceed.  We will therefore schedule the procedure at the next available time. Hold plavix 7 days prior to the procedure.  Cath note 08/06/15 is reviewed.  Dr Irish Lack advises DAPT for at least a year.  I think we can safely hold plavix for this procedure.  2. Paroxysmal atrial fibrillation afib burden is 0% (well controlled) Not currently on  anticoagulation  2. HTN Stable No change required today  Thompson Grayer MD, [facility] 01/28/2018 12:26 PM

## 2018-01-28 NOTE — Patient Instructions (Addendum)
Medication Instructions:  Your physician recommends that you continue on your current medications as directed. Please refer to the Current Medication list given to you today.  Labwork: You will get lab work today:  BMP and CBC.  Testing/Procedures: Your physician has recommended that you have your generator to your device replaced.  Follow-Up: You will follow up with device clinic 10-14 days after your procedure for a wound check.  You will follow up with Dr. Rayann Heman 91 days after your procedure.  Any Other Special Instructions Will Be Listed Below (If Applicable).  Please arrive at the [facility] main entrance of [place] [facility] at:  9:00 am on February 13, 2018 Use the CHG surgical scrub as directed Do not eat or drink after midnight prior to procedure Do not take any medications the morning of the procedure Do NOT TAKE your PLAVIX for 7 days prior to your procedure You will be discharged after your procedure You will need someone to drive you home at discharge  If you need a refill on your cardiac medications before your next appointment, please call your pharmacy.

## 2018-01-28 NOTE — H&P (View-Only) (Signed)
PCP: Mayra Neer, MD Primary Cardiologist: Dr Radford Pax Primary EP: Dr Mariane Duval LATOYA DISKIN is a 77 y.o. male who presents today for routine electrophysiology followup.  Since last being seen in our clinic, the patient reports doing very well.  Today, he denies symptoms of palpitations, chest pain, shortness of breath,  lower extremity edema, dizziness, presyncope, syncope, or ICD shocks.  The patient is otherwise without complaint today.   Past Medical History:  Diagnosis Date  . CAD (coronary artery disease)    a. s/p CABG 1997 b. PCI (BMS) of SVG to RCA 3/09 c. redo CABG 02/2011 with SVG to PDA, SVG to Lcx d. Cath 07/2013: ischemic cardiomyopathy with LVEF less than 20%, occlusion of the SVG's placed in 2012 e.stenting of LM in 07/2015 with PCI of LCx performed as well  . CHF (congestive heart failure) ([place])   . Chronic systolic dysfunction of left ventricle    EF 30%  . Depression   . DJD (degenerative joint disease)   . DM (diabetes mellitus) ([place]) 02/14/2011  . GERD (gastroesophageal reflux disease)   . Gout   . HTN (hypertension) 02/14/2011  . Hyperlipemia   . Ischemic cardiomyopathy    s/p ICD Implantation by Dr Leonia Reeves  . Paroxysmal atrial fibrillation ([place]) 10/22/2014   single episode of AF x 3 hours 48 minutes recorded on ICD, chads2vasc score is at least 5   . Renal disorder    Past Surgical History:  Procedure Laterality Date  . BI-VENTRICULAR IMPLANTABLE CARDIOVERTER DEFIBRILLATOR UPGRADE N/A 08/24/2011   Procedure: BI-VENTRICULAR IMPLANTABLE CARDIOVERTER DEFIBRILLATOR UPGRADE;  Surgeon: Evans Lance, MD;  Location: [facility] CATH LAB;  Service: Cardiovascular;  Laterality: N/A;  . CARDIAC CATHETERIZATION  08/03/2015   Procedure: Left Heart Cath and Cors/Grafts Angiography;  Surgeon: Peter M Martinique, MD;  Location: [place] CV LAB;  Service: Cardiovascular;;  . CARDIAC CATHETERIZATION N/A 08/06/2015   Procedure: Coronary Stent Intervention w/Impella;  Surgeon: Jettie Booze, MD;  Location: [place] CV LAB;  Service: Cardiovascular;  Laterality: N/A;  . CARDIAC CATHETERIZATION  08/06/2015   Procedure: Left Heart Cath;  Surgeon: Jettie Booze, MD;  Location: [place] CV LAB;  Service: Cardiovascular;;  . CARDIAC CATHETERIZATION  08/06/2015   Procedure: Coronary Balloon Angioplasty;  Surgeon: Jettie Booze, MD;  Location: [place] CV LAB;  Service: Cardiovascular;;  . CARDIAC DEFIBRILLATOR PLACEMENT  08/2004   initial placement, upgraded to [place] ICD by Dr Lovena Le 08/24/11 (MDT)  . CATARACT EXTRACTION W/ INTRAOCULAR LENS  IMPLANT, BILATERAL  2012  . CHOLECYSTECTOMY  01/05/2012   Procedure: LAPAROSCOPIC CHOLECYSTECTOMY WITH INTRAOPERATIVE CHOLANGIOGRAM;  Surgeon: Joyice Faster. Cornett, MD;  Location: [place];  Service: General;  Laterality: N/A;  laparoscopic cholecysectoym with intraoperative cholangiogram  . COLONOSCOPY WITH PROPOFOL N/A 11/18/2013   Procedure: COLONOSCOPY WITH PROPOFOL;  Surgeon: Garlan Fair, MD;  Location: [facility];  Service: Endoscopy;  Laterality: N/A;  . CORONARY ANGIOPLASTY WITH STENT PLACEMENT  06/2007   BMS to SVG to RCA  . CORONARY ARTERY BYPASS GRAFT  01/1996   CABG x 5 LIMA to LAD SVG to diag1,2,svg to om,svg to RCA  . CORONARY ARTERY BYPASS GRAFT  03/06/2011   CABG X2; Procedure: REDO CORONARY ARTERY BYPASS GRAFTING (CABG);  Surgeon: Grace Isaac, MD;  Location: [place];  Service: Open Heart Surgery;  Laterality: N/A;  times two grafts using right saphenous vein harvested endoscopically.  Marland Kitchen KNEE ARTHROTOMY  ~ 1978   RIGHT KNEE CARTILAGE  REMOVED  . LEFT HEART CATHETERIZATION WITH CORONARY ANGIOGRAM N/A 06/06/2011   Procedure: LEFT HEART CATHETERIZATION WITH CORONARY ANGIOGRAM;  Surgeon: Sueanne Margarita, MD;  Location: [place] CATH LAB;  Service: Cardiovascular;  Laterality: N/A;  . LEFT HEART CATHETERIZATION WITH CORONARY/GRAFT ANGIOGRAM N/A 08/08/2013   Procedure: LEFT HEART CATHETERIZATION WITH Beatrix Fetters;   Surgeon: Sinclair Grooms, MD;  Location: [facility] CATH LAB;  Service: Cardiovascular;  Laterality: N/A;  . LUMBAR [place] SURGERY  2003  . RIGHT HEART CATHETERIZATION N/A 02/17/2014   Procedure: RIGHT HEART CATH;  Surgeon: Larey Dresser, MD;  Location: [facility] CATH LAB;  Service: Cardiovascular;  Laterality: N/A;  . VENOGRAM N/A 06/08/2011   Procedure: VENOGRAM;  Surgeon: Thompson Grayer, MD;  Location: [facility] CATH LAB;  Service: Cardiovascular;  Laterality: N/A;    ROS- all systems are reviewed and negative except as per HPI above  Current Outpatient Medications  Medication Sig Dispense Refill  . allopurinol (ZYLOPRIM) 100 MG tablet Take 100 mg by mouth daily.     Marland Kitchen aspirin EC 81 MG tablet Take 81 mg by mouth daily.    Marland Kitchen atorvastatin (LIPITOR) 80 MG tablet TAKE 1 TABLET BY MOUTH DAILY AT 6:00 PM 90 tablet 6  . carvedilol (COREG) 25 MG tablet TAKE 1/2 (ONE-HALF) TABLET BY MOUTH TWICE DAILY WITH A MEAL 30 tablet 3  . clopidogrel (PLAVIX) 75 MG tablet Take 1 tablet (75 mg total) by mouth daily. 90 tablet 3  . eplerenone (INSPRA) 25 MG tablet TAKE 1 TABLET BY MOUTH ONCE DAILY 30 tablet 3  . furosemide (LASIX) 20 MG tablet Take 3 tablets (60 mg total) by mouth 2 (two) times daily. 180 tablet 0  . Insulin Glargine (TOUJEO SOLOSTAR) 300 UNIT/ML SOPN Inject 30 Units into the skin daily.    . insulin regular human CONCENTRATED (HUMULIN R U-500 KWIKPEN) 500 UNIT/ML kwikpen 30 units before BFST, 20 at lunch and 35 before supper, take 30-60 minutes before each meal 2 pen 3  . losartan (COZAAR) 25 MG tablet Take 25 mg by mouth daily.    . nitroGLYCERIN (NITROSTAT) 0.4 MG SL tablet DISSOLVE ONE TABLET UNDER THE TONGUE EVERY 5 MINUTES AS NEEDED FOR CHEST PAIN.  DO NOT EXCEED A TOTAL OF 3 DOSES IN 15 MINUTES 25 tablet 3  . QUEtiapine (SEROQUEL) 100 MG tablet Take 1 tablet by mouth daily.  3  . ranolazine (RANEXA) 1000 MG SR tablet Take 500 mg by mouth 2 (two) times daily.    Marland Kitchen venlafaxine (EFFEXOR-XR) 150 MG 24 hr capsule  Take 150 mg by mouth at bedtime.      No current facility-administered medications for this visit.     Physical Exam: Vitals:   01/28/18 1215  BP: 106/64  Pulse: 82  SpO2: 97%  Weight: 198 lb 12.8 oz (90.2 kg)  Height: 5\' 10"  (1.778 m)    GEN- The patient is well appearing, alert and oriented x 3 today.   Head- normocephalic, atraumatic Eyes-  Sclera clear, conjunctiva pink Ears- hearing intact Oropharynx- clear Lungs- Clear to ausculation bilaterally, normal work of breathing Chest- ICD pocket is well healed Heart- Regular rate and rhythm, no murmurs, rubs or gallops, PMI not laterally displaced GI- soft, NT, ND, + BS Extremities- no clubbing, cyanosis, or edema  ICD interrogation- reviewed in detail today,  See PACEART report  ekg tracing ordered today is personally reviewed and shows sinus wit BiV pacing, PVCs  Wt Readings from Last 3 Encounters:  01/28/18 198 lb 12.8  oz (90.2 kg)  01/08/18 197 lb 3.2 oz (89.4 kg)  01/06/18 193 lb (87.5 kg)    Assessment and Plan:  1.  Chronic systolic dysfunction/ ischemic CM/ CAD euvolemic today No ischemic symptoms Echo 07/11/17 reveals EF 30% Stable on an appropriate medical regimen BiV ICD has reached ERI See Pace Art report Risks, benefits, and alternatives to BiV ICD pulse generator replacement were discussed in detail today.  The patient understands that risks include but are not limited to bleeding, infection, pneumothorax, perforation, tamponade, vascular damage, renal failure, MI, stroke, death, inappropriate shocks, damage to his existing leads, and lead dislodgement and wishes to proceed.  We will therefore schedule the procedure at the next available time. Hold plavix 7 days prior to the procedure.  Cath note 08/06/15 is reviewed.  Dr Irish Lack advises DAPT for at least a year.  I think we can safely hold plavix for this procedure.  2. Paroxysmal atrial fibrillation afib burden is 0% (well controlled) Not currently on  anticoagulation  2. HTN Stable No change required today  Thompson Grayer MD, [facility] 01/28/2018 12:26 PM

## 2018-01-29 LAB — CUP PACEART INCLINIC DEVICE CHECK
Brady Statistic AP VS Percent: 0.02 %
Brady Statistic AS VP Percent: 95.22 %
Brady Statistic AS VS Percent: 1.32 %
Brady Statistic RV Percent Paced: 95.64 %
Date Time Interrogation Session: 20191014162508
HIGH POWER IMPEDANCE MEASURED VALUE: 41 Ohm
HIGH POWER IMPEDANCE MEASURED VALUE: 418 Ohm
HIGH POWER IMPEDANCE MEASURED VALUE: 50 Ohm
Implantable Lead Implant Date: 20111003
Implantable Lead Implant Date: 20111003
Implantable Lead Location: 753858
Implantable Lead Location: 753859
Implantable Lead Location: 753860
Implantable Pulse Generator Implant Date: 20130509
Lead Channel Impedance Value: 361 Ohm
Lead Channel Impedance Value: 399 Ohm
Lead Channel Impedance Value: 513 Ohm
Lead Channel Impedance Value: 665 Ohm
Lead Channel Pacing Threshold Amplitude: 1 V
Lead Channel Pacing Threshold Amplitude: 1.625 V
Lead Channel Pacing Threshold Pulse Width: 0.4 ms
Lead Channel Pacing Threshold Pulse Width: 0.4 ms
Lead Channel Sensing Intrinsic Amplitude: 31.625 mV
Lead Channel Sensing Intrinsic Amplitude: 31.625 mV
Lead Channel Setting Pacing Amplitude: 2.5 V
Lead Channel Setting Pacing Amplitude: 3 V
Lead Channel Setting Pacing Pulse Width: 0.4 ms
Lead Channel Setting Pacing Pulse Width: 0.4 ms
MDC IDC LEAD IMPLANT DT: 20130509
MDC IDC MSMT BATTERY VOLTAGE: 2.61 V
MDC IDC MSMT LEADCHNL RA IMPEDANCE VALUE: 456 Ohm
MDC IDC MSMT LEADCHNL RA SENSING INTR AMPL: 1.25 mV
MDC IDC MSMT LEADCHNL RA SENSING INTR AMPL: 1.5 mV
MDC IDC MSMT LEADCHNL RV PACING THRESHOLD AMPLITUDE: 0.625 V
MDC IDC MSMT LEADCHNL RV PACING THRESHOLD PULSEWIDTH: 0.4 ms
MDC IDC SET LEADCHNL LV PACING AMPLITUDE: 2 V
MDC IDC SET LEADCHNL RV SENSING SENSITIVITY: 0.3 mV
MDC IDC STAT BRADY AP VP PERCENT: 3.44 %
MDC IDC STAT BRADY RA PERCENT PACED: 3.38 %

## 2018-01-29 LAB — BASIC METABOLIC PANEL
BUN/Creatinine Ratio: 24 (ref 10–24)
BUN: 35 mg/dL — ABNORMAL HIGH (ref 8–27)
CALCIUM: 9.7 mg/dL (ref 8.6–10.2)
CHLORIDE: 97 mmol/L (ref 96–106)
CO2: 25 mmol/L (ref 20–29)
CREATININE: 1.44 mg/dL — AB (ref 0.76–1.27)
GFR calc Af Amer: 55 mL/min/{1.73_m2} — ABNORMAL LOW (ref >59)
GFR calc non Af Amer: 47 mL/min/{1.73_m2} — ABNORMAL LOW (ref >59)
GLUCOSE: 192 mg/dL — AB (ref 65–99)
Potassium: 4.3 mmol/L (ref 3.5–5.2)
Sodium: 138 mmol/L (ref 134–144)

## 2018-01-29 LAB — CBC WITH DIFFERENTIAL/PLATELET
BASOS ABS: 0 10*3/uL (ref 0.0–0.2)
Basos: 0 %
EOS (ABSOLUTE): 0.2 10*3/uL (ref 0.0–0.4)
EOS: 2 %
HEMATOCRIT: 39.5 % (ref 37.5–51.0)
HEMOGLOBIN: 13.4 g/dL (ref 13.0–17.7)
IMMATURE GRANULOCYTES: 1 %
Immature Grans (Abs): 0.1 10*3/uL (ref 0.0–0.1)
Lymphocytes Absolute: 1.5 10*3/uL (ref 0.7–3.1)
Lymphs: 14 %
MCH: 30.3 pg (ref 26.6–33.0)
MCHC: 33.9 g/dL (ref 31.5–35.7)
MCV: 89 fL (ref 79–97)
Monocytes Absolute: 0.7 10*3/uL (ref 0.1–0.9)
Monocytes: 7 %
NEUTROS PCT: 76 %
Neutrophils Absolute: 8.3 10*3/uL — ABNORMAL HIGH (ref 1.4–7.0)
Platelets: 261 10*3/uL (ref 150–450)
RBC: 4.42 x10E6/uL (ref 4.14–5.80)
RDW: 14 % (ref 12.3–15.4)
WBC: 10.8 10*3/uL (ref 3.4–10.8)

## 2018-01-31 ENCOUNTER — Other Ambulatory Visit (HOSPITAL_COMMUNITY): Payer: Self-pay | Admitting: Cardiology

## 2018-02-11 IMAGING — CT CT HEAD W/O CM
1 series · 16 of 30 positions shown, 20 images · non-contrast
Comparison: 12/23/2011

CLINICAL DATA: Confusion, dizziness for 2-3 months

EXAM:
CT HEAD WITHOUT CONTRAST
TECHNIQUE: Contiguous axial images were obtained from the base of the skull
through the vertex without intravenous contrast.

[Series 2: head 5.0 h30s · axial · 0.45mm/px · z∈[-124,+21]mm · 16 of 33 slices shown, 20 images]
[im 2/33  brain]
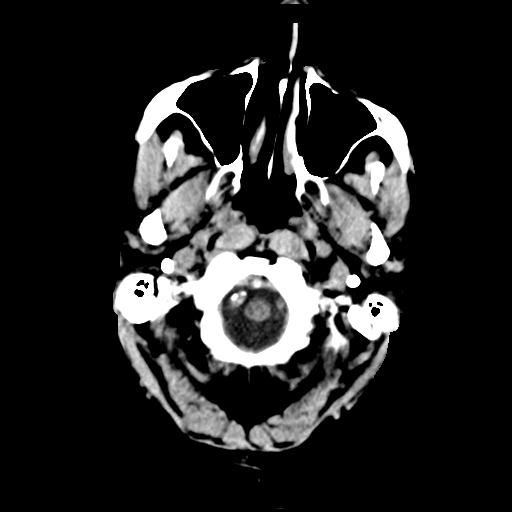
[im 2/33  bone]
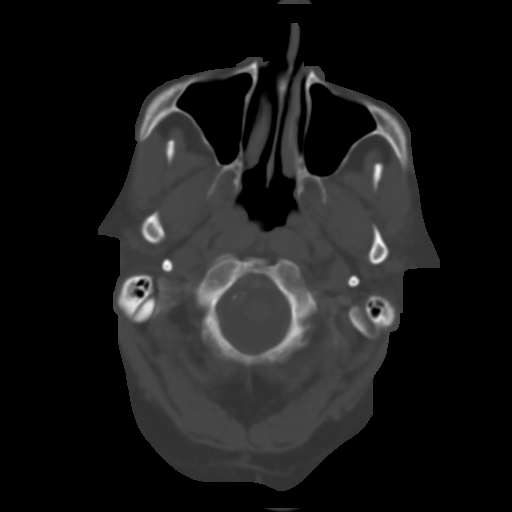
[im 4/33  brain]
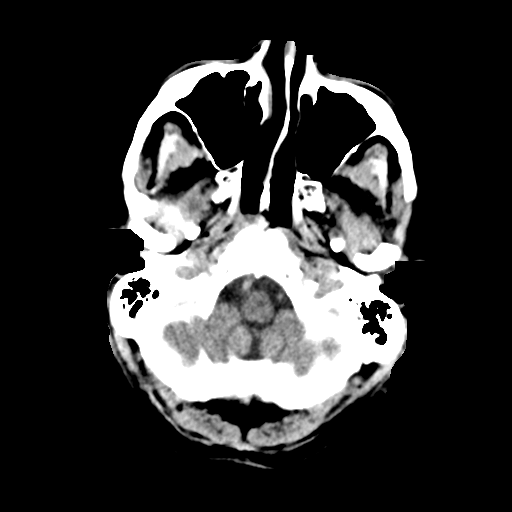
[im 6/33  brain]
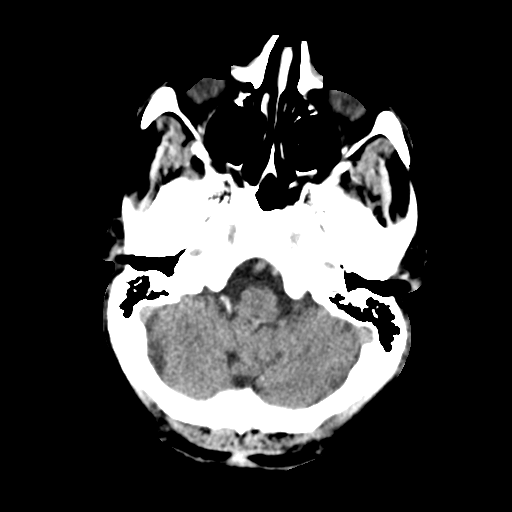
[im 8/33  brain]
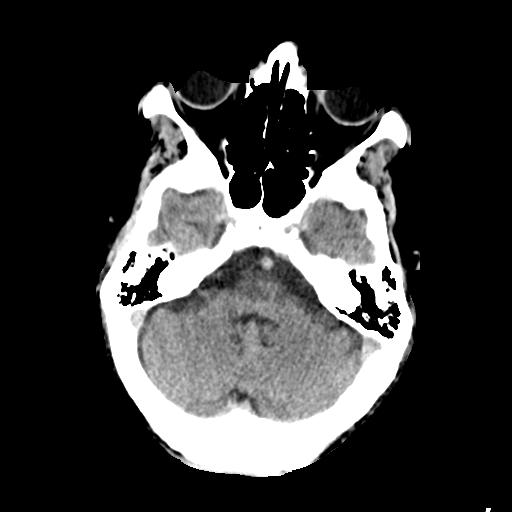
[im 9/33  brain]
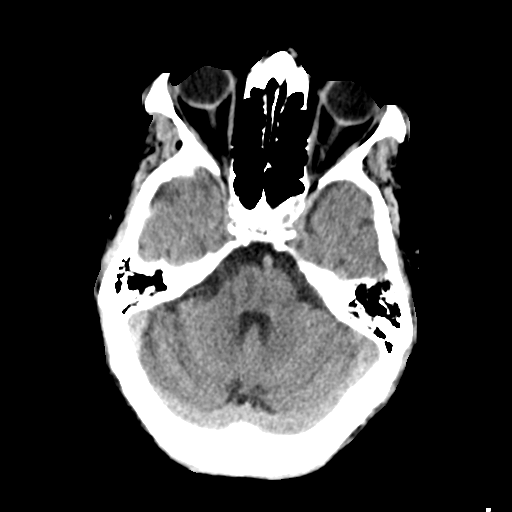
[im 9/33  bone]
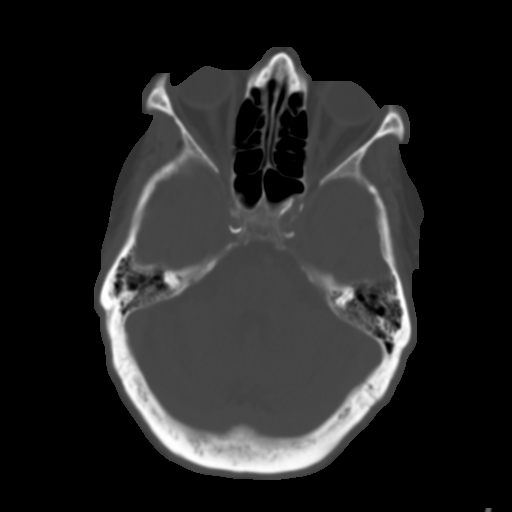
[im 12/33  brain]
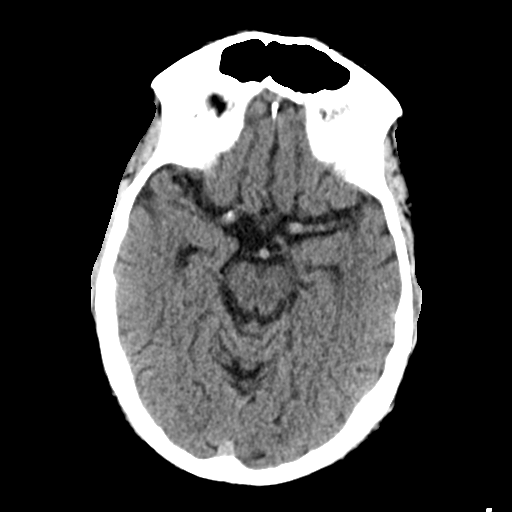
[im 14/33  brain]
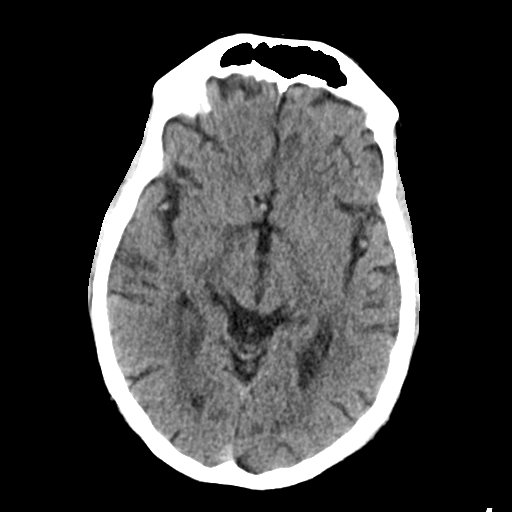
[im 16/33  brain]
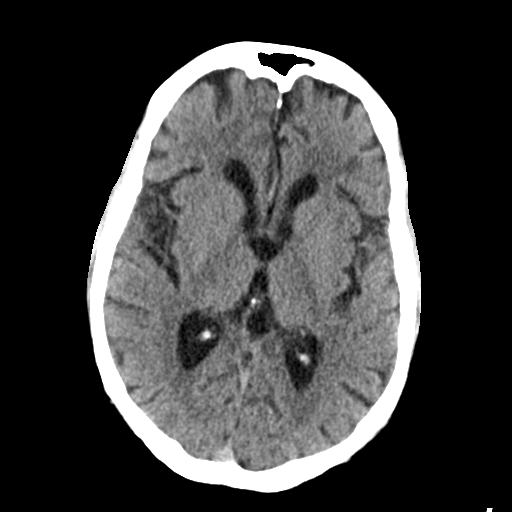
[im 17/33  brain]
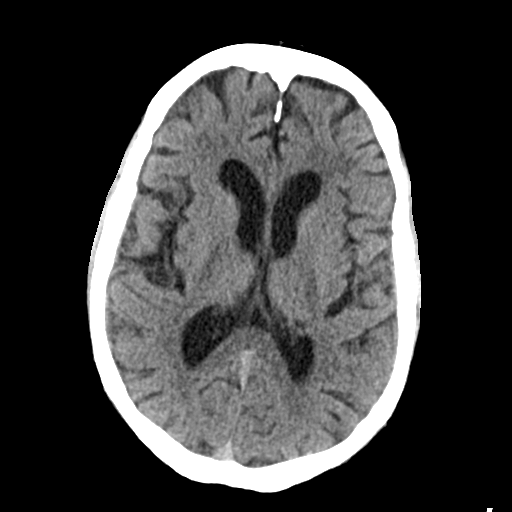
[im 17/33  bone]
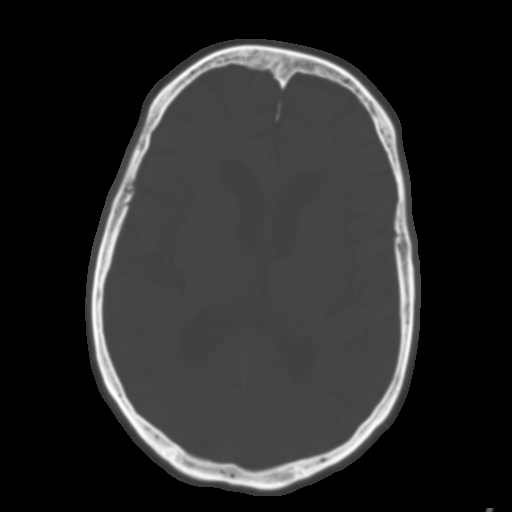
[im 19/33  brain]
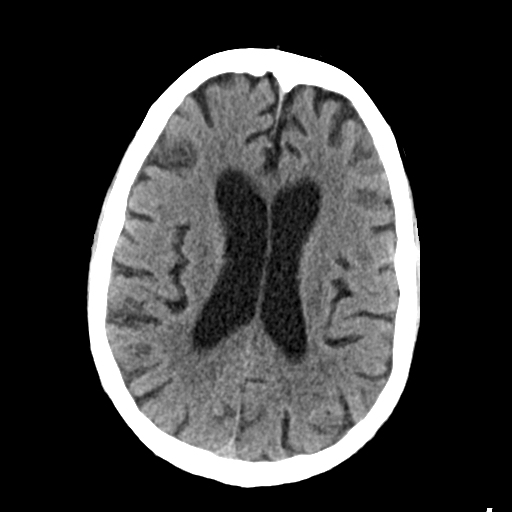
[im 21/33  brain]
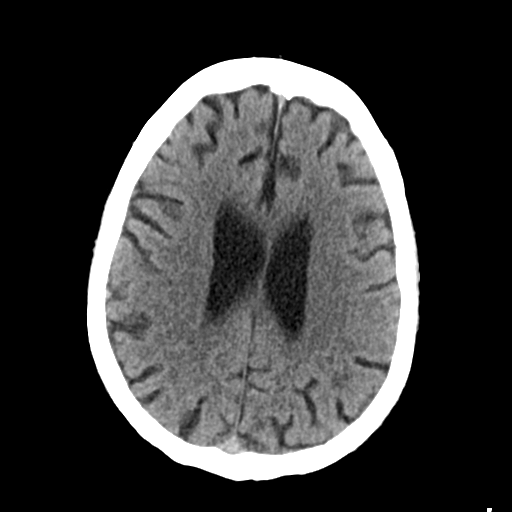
[im 24/33  brain]
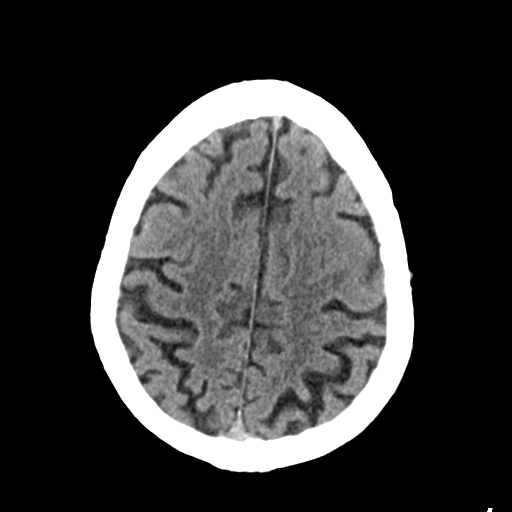
[im 25/33  brain]
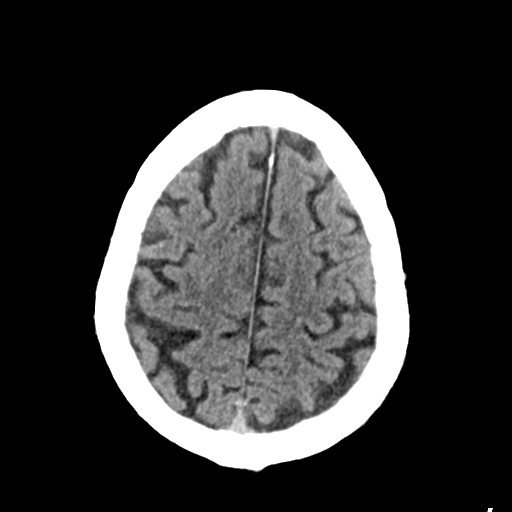
[im 25/33  bone]
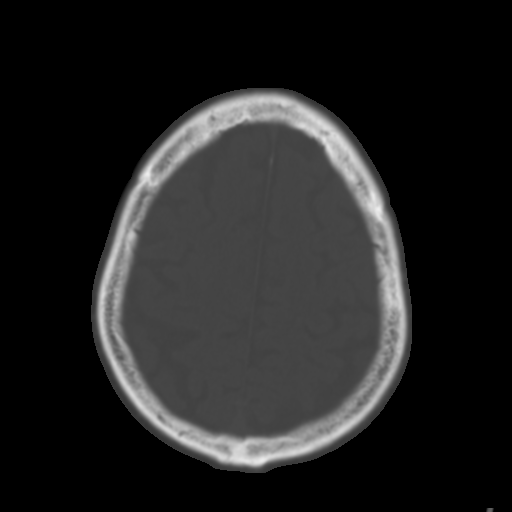
[im 27/33  brain]
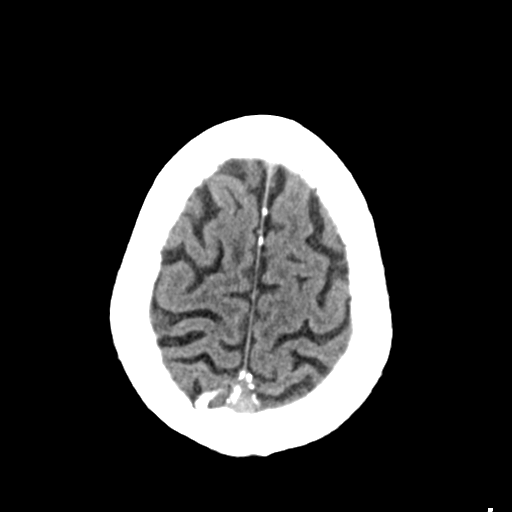
[im 29/33  brain]
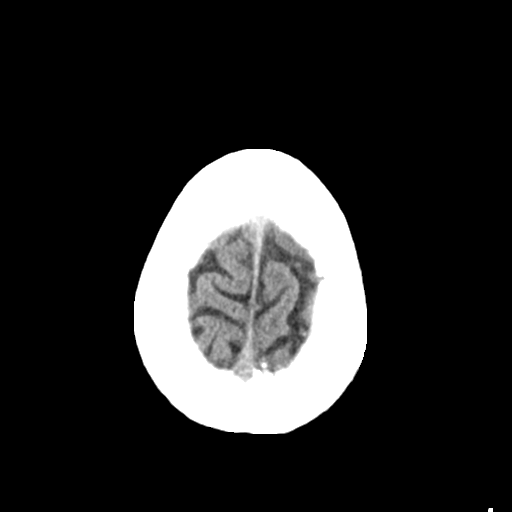
[im 31/33  brain]
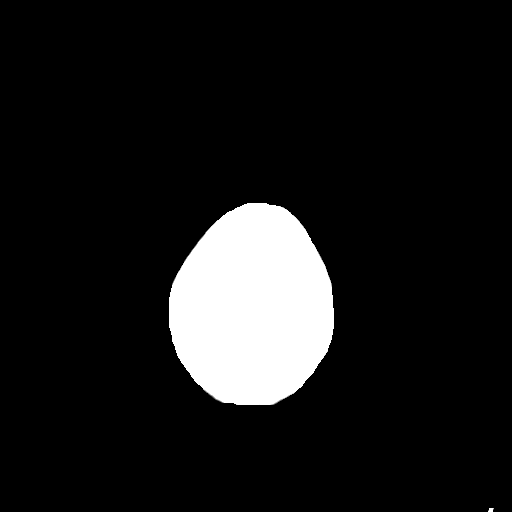

[16 of 30 positions shown; findings below may reference images not displayed]

FINDINGS: Mild age related volume loss. No acute intracranial abnormality.
Specifically, no hemorrhage, hydrocephalus, mass lesion, acute
infarction, or significant intracranial injury. No acute calvarial
abnormality. Visualized paranasal sinuses and mastoids clear.
Orbital soft tissues unremarkable.
IMPRESSION: No acute intracranial abnormality.

## 2018-02-13 ENCOUNTER — Encounter (HOSPITAL_COMMUNITY)
Admission: RE | Disposition: A | Discharge status: Home / Self Care | Payer: Self-pay | Source: Ambulatory Visit | Attending: Internal Medicine

## 2018-02-13 ENCOUNTER — Other Ambulatory Visit: Payer: Self-pay

## 2018-02-13 ENCOUNTER — Ambulatory Visit (HOSPITAL_COMMUNITY)
Admission: RE | Admit: 2018-02-13 | Discharge: 2018-02-13 | Disposition: A | Discharge status: Home / Self Care | Payer: Medicare HMO | Source: Ambulatory Visit | Attending: Internal Medicine | Admitting: Internal Medicine

## 2018-02-13 DIAGNOSIS — F329 Major depressive disorder, single episode, unspecified: Secondary | ICD-10-CM | POA: Insufficient documentation

## 2018-02-13 DIAGNOSIS — I110 Hypertensive heart disease with heart failure: Secondary | ICD-10-CM | POA: Diagnosis not present

## 2018-02-13 DIAGNOSIS — I447 Left bundle-branch block, unspecified: Secondary | ICD-10-CM | POA: Diagnosis not present

## 2018-02-13 DIAGNOSIS — I2510 Atherosclerotic heart disease of native coronary artery without angina pectoris: Secondary | ICD-10-CM | POA: Insufficient documentation

## 2018-02-13 DIAGNOSIS — Z9842 Cataract extraction status, left eye: Secondary | ICD-10-CM | POA: Diagnosis not present

## 2018-02-13 DIAGNOSIS — Z79899 Other long term (current) drug therapy: Secondary | ICD-10-CM | POA: Diagnosis not present

## 2018-02-13 DIAGNOSIS — Z955 Presence of coronary angioplasty implant and graft: Secondary | ICD-10-CM | POA: Insufficient documentation

## 2018-02-13 DIAGNOSIS — Z7982 Long term (current) use of aspirin: Secondary | ICD-10-CM | POA: Diagnosis not present

## 2018-02-13 DIAGNOSIS — Z9889 Other specified postprocedural states: Secondary | ICD-10-CM | POA: Diagnosis not present

## 2018-02-13 DIAGNOSIS — M109 Gout, unspecified: Secondary | ICD-10-CM | POA: Insufficient documentation

## 2018-02-13 DIAGNOSIS — K219 Gastro-esophageal reflux disease without esophagitis: Secondary | ICD-10-CM | POA: Insufficient documentation

## 2018-02-13 DIAGNOSIS — I5022 Chronic systolic (congestive) heart failure: Secondary | ICD-10-CM | POA: Insufficient documentation

## 2018-02-13 DIAGNOSIS — Z006 Encounter for examination for normal comparison and control in clinical research program: Secondary | ICD-10-CM | POA: Diagnosis not present

## 2018-02-13 DIAGNOSIS — Z9049 Acquired absence of other specified parts of digestive tract: Secondary | ICD-10-CM | POA: Diagnosis not present

## 2018-02-13 DIAGNOSIS — E119 Type 2 diabetes mellitus without complications: Secondary | ICD-10-CM | POA: Insufficient documentation

## 2018-02-13 DIAGNOSIS — Z951 Presence of aortocoronary bypass graft: Secondary | ICD-10-CM | POA: Insufficient documentation

## 2018-02-13 DIAGNOSIS — M1990 Unspecified osteoarthritis, unspecified site: Secondary | ICD-10-CM | POA: Diagnosis not present

## 2018-02-13 DIAGNOSIS — Z794 Long term (current) use of insulin: Secondary | ICD-10-CM | POA: Diagnosis not present

## 2018-02-13 DIAGNOSIS — Z7902 Long term (current) use of antithrombotics/antiplatelets: Secondary | ICD-10-CM | POA: Insufficient documentation

## 2018-02-13 DIAGNOSIS — E785 Hyperlipidemia, unspecified: Secondary | ICD-10-CM | POA: Insufficient documentation

## 2018-02-13 DIAGNOSIS — Z9841 Cataract extraction status, right eye: Secondary | ICD-10-CM | POA: Diagnosis not present

## 2018-02-13 DIAGNOSIS — I255 Ischemic cardiomyopathy: Secondary | ICD-10-CM | POA: Diagnosis not present

## 2018-02-13 DIAGNOSIS — I480 Paroxysmal atrial fibrillation: Secondary | ICD-10-CM | POA: Diagnosis not present

## 2018-02-13 DIAGNOSIS — Z4502 Encounter for adjustment and management of automatic implantable cardiac defibrillator: Secondary | ICD-10-CM | POA: Diagnosis not present

## 2018-02-13 HISTORY — PX: BIV ICD GENERATOR CHANGEOUT: EP1194

## 2018-02-13 LAB — GLUCOSE, CAPILLARY
GLUCOSE-CAPILLARY: 243 mg/dL — AB (ref 70–99)
Glucose-Capillary: 247 mg/dL — ABNORMAL HIGH (ref 70–99)

## 2018-02-13 LAB — SURGICAL PCR SCREEN
MRSA, PCR: NEGATIVE
STAPHYLOCOCCUS AUREUS: NEGATIVE

## 2018-02-13 SURGERY — BIV ICD GENERATOR CHANGEOUT
Anesthesia: LOCAL

## 2018-02-13 MED ORDER — SODIUM CHLORIDE 0.9 % IV SOLN
80.0000 mg | INTRAVENOUS | Status: AC
  Administered 2018-02-13: 80 mg

## 2018-02-13 MED ORDER — ONDANSETRON HCL 4 MG/2ML IJ SOLN: 4.0000 mg | Freq: Four times a day (QID) | INTRAMUSCULAR | Status: DC | PRN

## 2018-02-13 MED ORDER — SODIUM CHLORIDE 0.9 % IV SOLN: 250.0000 mL | INTRAVENOUS | Status: DC | PRN

## 2018-02-13 MED ORDER — SODIUM CHLORIDE 0.9% FLUSH: 3.0000 mL | Freq: Two times a day (BID) | INTRAVENOUS | Status: DC

## 2018-02-13 MED ORDER — LIDOCAINE HCL (PF) 1 % IJ SOLN
INTRAMUSCULAR | Status: DC | PRN
  Administered 2018-02-13: 45 mL

## 2018-02-13 MED ORDER — CEFAZOLIN SODIUM-DEXTROSE 2-4 GM/100ML-% IV SOLN
2.0000 g | INTRAVENOUS | Status: AC
  Administered 2018-02-13: 2 g via INTRAVENOUS

## 2018-02-13 MED ORDER — SODIUM CHLORIDE 0.9% FLUSH: 3.0000 mL | INTRAVENOUS | Status: DC | PRN

## 2018-02-13 MED ORDER — CHLORHEXIDINE GLUCONATE 4 % EX LIQD: 60.0000 mL | Freq: Once | CUTANEOUS | Status: DC

## 2018-02-13 MED ORDER — CEFAZOLIN SODIUM-DEXTROSE 2-4 GM/100ML-% IV SOLN
INTRAVENOUS | Status: AC
  Filled 2018-02-13: qty 100

## 2018-02-13 MED ORDER — ACETAMINOPHEN 325 MG PO TABS
325.0000 mg | ORAL_TABLET | ORAL | Status: DC | PRN
  Filled 2018-02-13: qty 2

## 2018-02-13 MED ORDER — MUPIROCIN 2 % EX OINT
TOPICAL_OINTMENT | CUTANEOUS | Status: AC
  Administered 2018-02-13: 10:00:00
  Filled 2018-02-13: qty 22

## 2018-02-13 MED ORDER — SODIUM CHLORIDE 0.9 % IV SOLN
INTRAVENOUS | Status: DC
  Administered 2018-02-13: 11:00:00 via INTRAVENOUS

## 2018-02-13 MED ORDER — SODIUM CHLORIDE 0.9 % IV SOLN
INTRAVENOUS | Status: AC
  Filled 2018-02-13: qty 2

## 2018-02-13 SURGICAL SUPPLY — 4 items
CABLE SURGICAL S-101-97-12 (CABLE) ×2 IMPLANT
ICD CLARIA MRI DTMA1D1 (ICD Generator) ×1 IMPLANT
PAD PRO RADIOLUCENT 2001M-C (PAD) ×2 IMPLANT
TRAY PACEMAKER INSERTION (PACKS) ×2 IMPLANT

## 2018-02-13 NOTE — Discharge Instructions (Signed)
RESUME PLAVIX SUNDAY     Pacemaker Battery Change, Care After This sheet gives you information about how to care for yourself after your procedure. Your health care provider may also give you more specific instructions. If you have problems or questions, contact your health care provider. What can I expect after the procedure? After your procedure, it is common to have:  Pain or soreness at the site where the pacemaker was inserted.  Swelling at the site where the pacemaker was inserted.  Follow these instructions at home: Incision care  Keep the incision clean and dry. Do no get wet for one week ? Do not take baths, swim, or use a hot tub until your health care provider approves.  ? Pat the area dry with a clean towel. Do not rub the area. This may cause bleeding.  Follow instructions from your health care provider about how to take care of your incision. Make sure you: ? Wash your hands with soap and water before you change your bandage (dressing). If soap and water are not available, use hand sanitizer. ? Change your dressing as told by your health care provider. ? Leave stitches (sutures), skin glue, or adhesive strips in place. These skin closures may need to stay in place for 2 weeks or longer. If adhesive strip edges start to loosen and curl up, you may trim the loose edges. Do not remove adhesive strips completely unless your health care provider tells you to do that.  Check your incision area every day for signs of infection. Check for: ? More redness, swelling, or pain. ? More fluid or blood. ? Warmth. ? Pus or a bad smell. Activity  Do not lift anything that is heavier than 10 lb (4.5 kg) until your health care provider says it is okay to do so.  For the first 2 weeks, or as long as told by your health care provider: ? Avoid lifting your left arm higher than your shoulder. ? Be gentle when you move your arms over your head. It is okay to raise your arm to comb your  hair. ? Avoid strenuous exercise.  Ask your health care provider when it is okay to: ? Resume your normal activities. ? Return to work or school. ? Resume sexual activity. Eating and drinking  Eat a heart-healthy diet. This should include plenty of fresh fruits and vegetables, whole grains, low-fat dairy products, and lean protein like chicken and fish.  Limit alcohol intake to no more than 1 drink a day for non-pregnant women and 2 drinks a day for men. One drink equals 12 oz of beer, 5 oz of wine, or 1 oz of hard liquor.  Check ingredients and nutrition facts on packaged foods and beverages. Avoid the following types of food: ? Food that is high in salt (sodium). ? Food that is high in saturated fat, like full-fat dairy or red meat. ? Food that is high in trans fat, like fried food. ? Food and drinks that are high in sugar. Lifestyle  Do not use any products that contain nicotine or tobacco, such as cigarettes and e-cigarettes. If you need help quitting, ask your health care provider.  Take steps to manage and control your weight.  Get regular exercise. Aim for 150 minutes of moderate-intensity exercise (such as walking or yoga) or 75 minutes of vigorous exercise (such as running or swimming) each week.  Manage other health problems, such as diabetes or high blood pressure. Ask your health care provider  how you can manage these conditions. General instructions  Do not drive for 24 hours after your procedure if you were given a medicine to help you relax (sedative).  Take over-the-counter and prescription medicines only as told by your health care provider.  Avoid putting pressure on the area where the pacemaker was placed.  If you need an MRI after your pacemaker has been placed, be sure to tell the health care provider who orders the MRI that you have a pacemaker.  Avoid close and prolonged exposure to electrical devices that have strong magnetic fields. These  include: ? Cell phones. Avoid keeping them in a pocket near the pacemaker, and try using the ear opposite the pacemaker. ? MP3 players. ? Household appliances, like microwaves. ? Metal detectors. ? Electric generators. ? High-tension wires.  Keep all follow-up visits as directed by your health care provider. This is important. Contact a health care provider if:  You have pain at the incision site that is not relieved by over-the-counter or prescription medicines.  You have any of these around your incision site or coming from it: ? More redness, swelling, or pain. ? Fluid or blood. ? Warmth to the touch. ? Pus or a bad smell.  You have a fever.  You feel brief, occasional palpitations, light-headedness, or any symptoms that you think might be related to your heart. Get help right away if:  You experience chest pain that is different from the pain at the pacemaker site.  You develop a red streak that extends above or below the incision site.  You experience shortness of breath.  You have palpitations or an irregular heartbeat.  You have light-headedness that does not go away quickly.  You faint or have dizzy spells.  Your pulse suddenly drops or increases rapidly and does not return to normal.  You begin to gain weight and your legs and ankles swell. Summary  After your procedure, it is common to have pain, soreness, and some swelling where the pacemaker was inserted.  Make sure to keep your incision clean and dry. Follow instructions from your health care provider about how to take care of your incision.  Check your incision every day for signs of infection, such as more pain or swelling, pus or a bad smell, warmth, or leaking fluid and blood.  Avoid strenuous exercise and lifting your left arm higher than your shoulder for 2 weeks, or as long as told by your health care provider. This information is not intended to replace advice given to you by your health care  provider. Make sure you discuss any questions you have with your health care provider. Document Released: 01/22/2013 Document Revised: 02/24/2016 Document Reviewed: 02/24/2016 Elsevier Interactive Patient Education  2017 [employer].

## 2018-02-13 NOTE — Interval H&P Note (Signed)
History and Physical Interval Note:  02/13/2018 11:16 AM  [name]  has presented today for surgery, with the diagnosis of ERI  The various methods of treatment have been discussed with the patient and family. After consideration of risks, benefits and other options for treatment, the patient has consented to  Procedure(s): BIV ICD [place] (N/A) as a surgical intervention .  The patient's history has been reviewed, patient examined, no change in status, stable for surgery.  I have reviewed the patient's chart and labs.  Questions were answered to the patient's satisfaction.    ICD Criteria  Current LVEF:30%. Within 12 months prior to implant: Yes   Heart failure history: Yes, Class III  Cardiomyopathy history: Yes, Ischemic Cardiomyopathy - Prior MI.  Atrial Fibrillation/Atrial Flutter: Yes, Paroxysmal.  Ventricular tachycardia history: No.  Cardiac arrest history: No.  History of syndromes with risk of sudden death:   Previous ICD: Yes, Reason for ICD:  Primary prevention.  Current ICD indication: Primary  PPM indication: Yes, BiV ICD (LBBB)  Beta Blocker therapy for 3 or more months: Yes, prescribed.   Ace Inhibitor/ARB therapy for 3 or more months: Yes, prescribed.    I have seen [name] is a 77 y.o. male  Who presents today for BiV ICD generator change for primary prevention of sudden death.  The patient's chart has been reviewed and they meet criteria for the procedure.  I have had a thorough discussion with the patient and wife reviewing options.  The patient and their wife have had opportunities to ask questions and have them answered. The patient and I have decided together through the [place] Decision Support Tool to proceed with BiV ICD generator change at this time.  Risks, benefits, alternatives to BiV ICD generator change were discussed in detail with the patient today. The patient  understands that the risks include but are not  limited to bleeding, infection, pneumothorax, perforation, tamponade, vascular damage, renal failure, MI, stroke, death, inappropriate shocks, and lead dislodgement and wishes to proceed.    Thompson Grayer MD, [facility] 02/13/2018 11:18 AM

## 2018-02-14 ENCOUNTER — Encounter (HOSPITAL_COMMUNITY): Payer: Self-pay | Admitting: Internal Medicine

## 2018-02-25 ENCOUNTER — Ambulatory Visit (INDEPENDENT_AMBULATORY_CARE_PROVIDER_SITE_OTHER): Payer: Medicare HMO | Admitting: *Deleted

## 2018-02-25 DIAGNOSIS — Z95810 Presence of automatic (implantable) cardiac defibrillator: Secondary | ICD-10-CM

## 2018-02-25 DIAGNOSIS — I255 Ischemic cardiomyopathy: Secondary | ICD-10-CM | POA: Diagnosis not present

## 2018-02-25 DIAGNOSIS — I5022 Chronic systolic (congestive) heart failure: Secondary | ICD-10-CM | POA: Diagnosis not present

## 2018-02-25 LAB — CUP PACEART INCLINIC DEVICE CHECK
Battery Remaining Longevity: 95 mo
Brady Statistic AP VP Percent: 4.32 %
Brady Statistic AP VS Percent: 0.01 %
Brady Statistic AS VS Percent: 0.54 %
Brady Statistic RV Percent Paced: 95.84 %
Date Time Interrogation Session: 20191111173022
HIGH POWER IMPEDANCE MEASURED VALUE: 37 Ohm
HighPow Impedance: 54 Ohm
Implantable Lead Implant Date: 20111003
Implantable Lead Implant Date: 20130509
Implantable Lead Location: 753858
Implantable Lead Location: 753859
Implantable Lead Model: 4196
Implantable Pulse Generator Implant Date: 20191030
Lead Channel Impedance Value: 380 Ohm
Lead Channel Impedance Value: 437 Ohm
Lead Channel Impedance Value: 513 Ohm
Lead Channel Pacing Threshold Amplitude: 0.5 V
Lead Channel Pacing Threshold Amplitude: 1 V
Lead Channel Pacing Threshold Pulse Width: 0.4 ms
Lead Channel Pacing Threshold Pulse Width: 0.6 ms
Lead Channel Sensing Intrinsic Amplitude: 31.625 mV
Lead Channel Setting Pacing Amplitude: 2 V
Lead Channel Setting Pacing Amplitude: 2.5 V
Lead Channel Setting Pacing Pulse Width: 0.4 ms
Lead Channel Setting Pacing Pulse Width: 0.4 ms
Lead Channel Setting Sensing Sensitivity: 0.3 mV
MDC IDC LEAD IMPLANT DT: 20111003
MDC IDC LEAD LOCATION: 753860
MDC IDC MSMT BATTERY VOLTAGE: 3.02 V
MDC IDC MSMT LEADCHNL LV IMPEDANCE VALUE: 399 Ohm
MDC IDC MSMT LEADCHNL LV IMPEDANCE VALUE: 665 Ohm
MDC IDC MSMT LEADCHNL RA IMPEDANCE VALUE: 456 Ohm
MDC IDC MSMT LEADCHNL RA PACING THRESHOLD AMPLITUDE: 1.25 V
MDC IDC MSMT LEADCHNL RA SENSING INTR AMPL: 1.375 mV
MDC IDC MSMT LEADCHNL RV PACING THRESHOLD PULSEWIDTH: 0.4 ms
MDC IDC SET LEADCHNL LV PACING AMPLITUDE: 2.5 V
MDC IDC STAT BRADY AS VP PERCENT: 95.12 %
MDC IDC STAT BRADY RA PERCENT PACED: 4.22 %

## 2018-02-25 NOTE — Progress Notes (Signed)
Wound check appointment. Steri-strips removed. Wound without redness or edema. Medial incision not yet completely healed, though incision edges fully approximated. Dr. Rayann Heman assessed site--advised safe to start washing site while bathing. Patient and wife educated about signs/symptoms of infection and are aware to call if any noted. Normal device function. Thresholds, sensing, and impedances consistent with implant measurements. Device programmed at chronic outputs s/p generator replacement, RA output reprogrammed to 2.5V @ 0.25ms. Histogram distribution appropriate for patient and level of activity. BiV pacing 95.8%. No mode switches noted. 1 VT-NS episode, 13 beats duration. Patient educated about wound care, arm mobility, shock plan, and Carelink monitor. ROV with JA on 05/15/18.

## 2018-03-03 ENCOUNTER — Other Ambulatory Visit (HOSPITAL_COMMUNITY): Payer: Self-pay | Admitting: Cardiology

## 2018-03-22 DIAGNOSIS — Z7984 Long term (current) use of oral hypoglycemic drugs: Secondary | ICD-10-CM | POA: Diagnosis not present

## 2018-03-22 DIAGNOSIS — I130 Hypertensive heart and chronic kidney disease with heart failure and stage 1 through stage 4 chronic kidney disease, or unspecified chronic kidney disease: Secondary | ICD-10-CM | POA: Diagnosis not present

## 2018-03-22 DIAGNOSIS — E1121 Type 2 diabetes mellitus with diabetic nephropathy: Secondary | ICD-10-CM | POA: Diagnosis not present

## 2018-03-22 DIAGNOSIS — E7800 Pure hypercholesterolemia, unspecified: Secondary | ICD-10-CM | POA: Diagnosis not present

## 2018-03-22 DIAGNOSIS — R69 Illness, unspecified: Secondary | ICD-10-CM | POA: Diagnosis not present

## 2018-03-22 DIAGNOSIS — I5022 Chronic systolic (congestive) heart failure: Secondary | ICD-10-CM | POA: Diagnosis not present

## 2018-03-22 DIAGNOSIS — N183 Chronic kidney disease, stage 3 (moderate): Secondary | ICD-10-CM | POA: Diagnosis not present

## 2018-03-22 DIAGNOSIS — Z6831 Body mass index (BMI) 31.0-31.9, adult: Secondary | ICD-10-CM | POA: Diagnosis not present

## 2018-03-22 DIAGNOSIS — E669 Obesity, unspecified: Secondary | ICD-10-CM | POA: Diagnosis not present

## 2018-04-01 ENCOUNTER — Ambulatory Visit (INDEPENDENT_AMBULATORY_CARE_PROVIDER_SITE_OTHER): Payer: Medicare HMO

## 2018-04-01 DIAGNOSIS — Z95810 Presence of automatic (implantable) cardiac defibrillator: Secondary | ICD-10-CM | POA: Diagnosis not present

## 2018-04-01 DIAGNOSIS — I5022 Chronic systolic (congestive) heart failure: Secondary | ICD-10-CM

## 2018-04-02 NOTE — Progress Notes (Signed)
EPIC Encounter for ICM Monitoring  Patient Name: [name] is a 77 y.o. male Date: 04/02/2018 Primary Care Physican: Mayra Neer, MD Primary Cardiologist:McLean Electrophysiologist: Allred Bi-V Pacing: 96.4%    Last weight: 196 lbs Today's weight: 196 lbs                                                Spoke with wife.  She reported weight was 202 lbs earlier this week but has dropped to baseline of 196 lbs.  Wife admitted to dietary salt indiscretion and patient has eaten country ham biscuits for last 2 days. Advised to limit salt intake and eating at restaurants since foods are high in sodium.    Thoracic impedance abnormal suggesting fluid accumulation since 03/22/2018 but trending back toward baseline.  Prescribed: Furosemide20 mg3tablets(60 mg total) by mouthtwo times a day.  Labs: 01/06/2018 Creatinine 1.60, BUN 30, Potassium 4.4, Sodium 140, EGFR 41-47 10/04/2017 Creatinine 1.70, BUN 40, Potassium 3.9, Sodium 138, EGFR 38-44 09/25/2017 Creatinine1.50, BUN40, Potassium4.0, Sodium139, [id number] 09/24/2017 Creatinine2.15, BUN49, Potassium4.2,Sodium 135, EGFR 28-33 @ 1:57AM  09/24/2017 Creatinine1.00, BUN41, Potassium4.0, Sodium133 @ 1:56 AM  09/11/2017 Creatinine1.64, BUN33, Potassium4.0, Sodium135, [id number] 07/11/2017 Creatinine 1.34, BUN 20, Potassium 4.0, Sodium 140, EGFR 51-59 06/08/2017 Creatinine 1.60, BUN 28, Potassium 3.7, Sodium 135, EGFR 41-47 05/31/2017 Creatinine1.54, BUN33, Potassium4.1, Sodium134, EGFR43-50 01/25/2019Creatinine 1.49, BUN23, Potassium4.7, Sodium134, [id number]  01/18/2019Creatinine 1.65, BUN42, Potassium4.5, Sodium131, [id number]  01/17/2019Creatinine1.99, BUN66, Potassium4.1, Sodium136, [id number]  01/16/2019Creatinine 2.87, BUN90, Potassium4.7, Sodium131, [id number]  Recommendations:  Advised to limit salt intake.  Will send copy to Dr Aundra Dubin for review and if any recommendations will  call back.  Follow-up plan: ICM clinic phone appointment on 04/08/2018 to recheck fluid levels.           Copy of ICM check sent to Dr. Rayann Heman and Dr Aundra Dubin.    3 month ICM trend: 04/01/2018    1 Year ICM trend:       [name], RN 04/02/2018 11:07 AM

## 2018-04-08 ENCOUNTER — Ambulatory Visit (INDEPENDENT_AMBULATORY_CARE_PROVIDER_SITE_OTHER): Payer: Medicare HMO

## 2018-04-08 DIAGNOSIS — I5022 Chronic systolic (congestive) heart failure: Secondary | ICD-10-CM

## 2018-04-08 DIAGNOSIS — Z95810 Presence of automatic (implantable) cardiac defibrillator: Secondary | ICD-10-CM

## 2018-04-08 NOTE — Progress Notes (Signed)
EPIC Encounter for ICM Monitoring  Patient Name: [name] is a 77 y.o. male Date: 04/08/2018 Primary Care Physican: Shaw, Kimberlee, MD Primary Cardiologist:McLean Electrophysiologist: Allred Bi-V Pacing: 95.9% Baseline weight: 196 lbs Today's Weight: 203 lbs        Spoke with wife.  Weight has increased as high as 205 lbs on 04/07/2018 but today is 203 lbs which is still approximately 7 lbs above baseline of 194-196 lbs.  Non adherence to low salt diet and frequently dines at [employer] caf[employer]ia.  Recommended to have family member look online at nutrition [employer] nutrition information.      Thoracic impedance abnormal suggesting fluid accumulation starting 03/20/2018.   Prescribed: Furosemide20 mg3tablets(60 mg total) by mouthtwo times a day.  Labs: 01/06/2018 Creatinine 1.60, BUN 30, Potassium 4.4, Sodium 140, EGFR 41-47 10/04/2017 Creatinine 1.70, BUN 40, Potassium 3.9, Sodium 138, EGFR 38-44 09/25/2017 Creatinine1.50, BUN40, Potassium4.0, Sodium139, EGFR44-51 09/24/2017 Creatinine2.15, BUN49, Potassium4.2,Sodium 135, EGFR 28-33 @ 1:57AM  09/24/2017 Creatinine1.00, BUN41, Potassium4.0, Sodium133 @ 1:56 AM  09/11/2017 Creatinine1.64, BUN33, Potassium4.0, Sodium135, EGFR39-46  Recommendations:  Medication adjustment is outside the ICM standing protocol orders and recommendations will be made by physician.    Follow-up plan: ICM clinic phone appointment on 04/22/2018 to recheck fluid levels since he has an office appointment scheduled 04/16/2018 with Dr. McLean.    Copy of ICM check sent to Dr. McLean and Dr Allred.   3 month ICM trend: 04/08/2018    1 Year ICM trend:        S , RN 04/08/2018 12:14 PM   

## 2018-04-08 NOTE — Progress Notes (Signed)
Increase lasix to 80 mg bid for the time being with BMET 1 week.

## 2018-04-09 NOTE — Progress Notes (Signed)
Attempted call to wife/patient and left message to return call regarding physician recommendations.  Dr Aundra Dubin recommended to increase lasix to 80 mg bid for the time being with BMET 1 week.  Patient has office appointment with Dr Aundra Dubin in a week 04/16/2018 and will make a note to have BMET drawn at office visit.

## 2018-04-09 NOTE — Progress Notes (Signed)
Called wifes cell phone and spoke with her.  Advised Dr Aundra Dubin recommended to increase lasix to 80 mg bid for the time being with BMET 1 week. Patient has office appointment with Dr Aundra Dubin in a week 04/16/2018 and will make a note to have BMET drawn at office visit.  She verbalized understanding of increased Lasix dosage.  Weight decreased to 201 lbs today. Advised to call back for any changes.

## 2018-04-16 ENCOUNTER — Ambulatory Visit (HOSPITAL_COMMUNITY)
Admission: RE | Admit: 2018-04-16 | Discharge: 2018-04-16 | Disposition: A | Discharge status: Home / Self Care | Payer: Medicare HMO | Source: Ambulatory Visit | Attending: Cardiology | Admitting: Cardiology

## 2018-04-16 ENCOUNTER — Encounter (HOSPITAL_COMMUNITY): Payer: Self-pay | Admitting: Cardiology

## 2018-04-16 VITALS — BP 110/62 | HR 68 | Wt 201.4 lb

## 2018-04-16 DIAGNOSIS — Z7902 Long term (current) use of antithrombotics/antiplatelets: Secondary | ICD-10-CM | POA: Diagnosis not present

## 2018-04-16 DIAGNOSIS — Z7982 Long term (current) use of aspirin: Secondary | ICD-10-CM | POA: Insufficient documentation

## 2018-04-16 DIAGNOSIS — M109 Gout, unspecified: Secondary | ICD-10-CM | POA: Insufficient documentation

## 2018-04-16 DIAGNOSIS — E1122 Type 2 diabetes mellitus with diabetic chronic kidney disease: Secondary | ICD-10-CM | POA: Diagnosis not present

## 2018-04-16 DIAGNOSIS — F329 Major depressive disorder, single episode, unspecified: Secondary | ICD-10-CM | POA: Diagnosis not present

## 2018-04-16 DIAGNOSIS — I2510 Atherosclerotic heart disease of native coronary artery without angina pectoris: Secondary | ICD-10-CM | POA: Insufficient documentation

## 2018-04-16 DIAGNOSIS — I252 Old myocardial infarction: Secondary | ICD-10-CM | POA: Insufficient documentation

## 2018-04-16 DIAGNOSIS — M1990 Unspecified osteoarthritis, unspecified site: Secondary | ICD-10-CM | POA: Diagnosis not present

## 2018-04-16 DIAGNOSIS — I5022 Chronic systolic (congestive) heart failure: Secondary | ICD-10-CM | POA: Diagnosis present

## 2018-04-16 DIAGNOSIS — Z87891 Personal history of nicotine dependence: Secondary | ICD-10-CM | POA: Diagnosis not present

## 2018-04-16 DIAGNOSIS — I255 Ischemic cardiomyopathy: Secondary | ICD-10-CM | POA: Diagnosis not present

## 2018-04-16 DIAGNOSIS — Z79899 Other long term (current) drug therapy: Secondary | ICD-10-CM | POA: Diagnosis not present

## 2018-04-16 DIAGNOSIS — Z9049 Acquired absence of other specified parts of digestive tract: Secondary | ICD-10-CM | POA: Insufficient documentation

## 2018-04-16 DIAGNOSIS — E785 Hyperlipidemia, unspecified: Secondary | ICD-10-CM | POA: Insufficient documentation

## 2018-04-16 DIAGNOSIS — Z951 Presence of aortocoronary bypass graft: Secondary | ICD-10-CM | POA: Diagnosis not present

## 2018-04-16 DIAGNOSIS — Z8249 Family history of ischemic heart disease and other diseases of the circulatory system: Secondary | ICD-10-CM | POA: Insufficient documentation

## 2018-04-16 DIAGNOSIS — Z955 Presence of coronary angioplasty implant and graft: Secondary | ICD-10-CM | POA: Insufficient documentation

## 2018-04-16 DIAGNOSIS — Z794 Long term (current) use of insulin: Secondary | ICD-10-CM | POA: Diagnosis not present

## 2018-04-16 DIAGNOSIS — I5042 Chronic combined systolic (congestive) and diastolic (congestive) heart failure: Secondary | ICD-10-CM | POA: Diagnosis not present

## 2018-04-16 DIAGNOSIS — N183 Chronic kidney disease, stage 3 (moderate): Secondary | ICD-10-CM | POA: Insufficient documentation

## 2018-04-16 LAB — BASIC METABOLIC PANEL
ANION GAP: 12 (ref 5–15)
BUN: 30 mg/dL — ABNORMAL HIGH (ref 8–23)
CALCIUM: 9.2 mg/dL (ref 8.9–10.3)
CO2: 27 mmol/L (ref 22–32)
Chloride: 100 mmol/L (ref 98–111)
Creatinine, Ser: 1.6 mg/dL — ABNORMAL HIGH (ref 0.61–1.24)
GFR calc non Af Amer: 42 mL/min — ABNORMAL LOW (ref >60)
GFR, EST AFRICAN AMERICAN: 48 mL/min — AB (ref >60)
Glucose, Bld: 206 mg/dL — ABNORMAL HIGH (ref 70–99)
Potassium: 3.9 mmol/L (ref 3.5–5.1)
Sodium: 139 mmol/L (ref 135–145)

## 2018-04-16 MED ORDER — EPLERENONE 50 MG PO TABS: 50.0000 mg | ORAL_TABLET | Freq: Every day | ORAL | 6 refills | Status: DC

## 2018-04-16 NOTE — Patient Instructions (Signed)
INCREASE Eplerenone to 50mg  (1 tab) daily  Labs today We will only contact you if something comes back abnormal or we need to make some changes. Otherwise no news is good news!  Labs to be repeated in 10 days  Your physician recommends that you schedule a follow-up appointment in: 3 months with an ECHO and visit with Dr. Aundra Dubin  Your physician has requested that you have an echocardiogram. Echocardiography is a painless test that uses sound waves to create images of your heart. It provides your doctor with information about the size and shape of your heart and how well your heart's chambers and valves are working. This procedure takes approximately one hour. There are no restrictions for this procedure.

## 2018-04-17 NOTE — Progress Notes (Signed)
Advanced Heart Failure Clinic Note   Patient ID: [name], male   DOB: 12-10-1940, 78 y.o.   MRN: [id number] PCP: Dr. Lynnda Child Cardiology: Dr Aundra Dubin  39 y.o. with history of CAD s/p CABG and redo CABG as well as ischemic cardiomyopathy with CRT-D device presents for followup of CHF and CAD.  He had a Cardiolite in the 4/15 showing lateral wall ischemia.  LHC at that time showed all his vein grafts from both CABG surgeries occluded.  The LIMA-LAD was patent and there was a 90% distal LM stenosis.  The lateral ischemia likely corresponded to LCx territory downstream from the LM stenosis.  PCI of the distal LM was thought to be high risk and characteristics of the lesion were not favorable for PCI.  Last echo showed EF 25-30% with moderate RV systolic dysfunction.  He had RHC in 11/15 with normal filling pressures and relatively preserved cardiac index (2.55).    In 4/17, he was admitted with chest pain concerning for unstable angina.  Angiography showed 85% distal LM with 90% ostial LCx stenosis.  LIMA was patent but proximal LAD, proximal RCA, and all SVGs were totally occluded. High had successful DES to LM and PTCA to ostial LCx with Impella support.  [name] was stopped and he was put back on low dose lisinopril due to symptomatic hypotension.  He had Cardiolite in 11/17 with scar, no ischemia.   He was admitted in 1/19 with AKI, creatinine up to 3. Meds held then restarted.   Echo 3/19 with EF 30-35%.   He was admitted with syncope and AKI in 6/19.  He was thought to be dehydrated and orthostatic. Losartan stopped but eventually restarted.   He returns for followup of CHF and CAD.  He has been doing well recently.  He had his device generator changed in 10/19.  No dyspnea walking on flat ground or walking up a flight of steps.  Same atypical chest pain as in past with no change to pattern.  No palpitations.  No orthopnea/PND.  Lasix was increased based on Optivol reading about a week ago,  weight is coming down.   Labs (9/15): LDL particle number 816, LDL 57, LFTs normal Labs (10/15): K 4.4, creatinine 1.4 Labs (11/15): K 4.3, creatinine 1.36 Labs (12/15): K 4.8, creatinine 1.34 Labs (3/16): K 4, creatinine 1.38, LDL 46, HDl 35 Labs (7/16): K 4, creatinine 1.39, BNP 211 Labs (03/03/15): K 4.1, creatinine 1.47, BNP 227, LDL 80, HDL 32 Labs (12/16): K 4.6, creatinine 1.12, LDL 68, HDL 34 Labs (4/17): K 3.4, creatinine 1.15 Labs (6/17): K 3.8, creatinine 1.67 Labs (7/17): K 4, creatinine 1.35 Labs (10/17): K 3.9, creatinine 1.31, LDL 45, HDL 30 Labs (2/18): K 4, creatinine 1.43 Labs (5/18): LDL 44, HDL 27 Labs (6/18): K 4.1, creatinine 1.49 Labs (12/18): K 4.4, creatinine 1.44 Labs (1/19): K 4.3, creatinine 1.48 Labs (2/19): K 3.7, creatinine 1.6 Labs (6/19): K 4, creatinine 1.5 Labs (9/19): K 4.4, creatinine 1.6, hgb 13.4 Labs (10/19): K 4.3, creatinine 1.44  PMH: 1. Gout 2. Hyperlipidemia 3. CAD: CABG 1997 and redo 11/12.   - LHC (4/15) with totally occluded LAD, totally occluded RCA, 80% distal LM, 50% mLCx, 2 SVG-RCA grafts totally occluded, 2 SVG-OM grafts totally occluded, patent LIMA-LAD.  Cardiolite prior to 4/15 cath showed lateral wall ischemia (LCx territory).  PCI to distal LM would be a high risk procedure.   - Cardiolite (8/16) with EF 20%, severe scar in RCA and probably  LCx territory, minimal peri-infarct ischemia.   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  - Unstable angina 4/17: 85% distal LM with 90% ostial LCx stenosis.  LIMA was patent but proximal LAD, proximal RCA, and all SVGs were totally occluded. He had successful DES to LM and PTCA to ostial LCx with Impella support  - Cardiolite (11/17): EF 20%, prior MI, no ischemia.  4. Ischemic Cardiomyopathy: Medtronic CRT-D device.   - Echo (6/15) with EF 25-30%, moderate LV dilation, inferior and inferolateral akinesis, moderately decreased RV systolic function, mild  MR.   - RHC (11/15) with mean RA 5, PA 23/6, mean PCWP 9, CI 2.55.  - Echo (4/17): EF 25-30% with mild MR.  - Echo (11/17): EF 25-30%, moderately dilated LV, grade II diastolic dysfunction, mild-moderate MR, mildly decreased RV systolic function.  - Echo (3/19): EF 30-35%, inferior/inferolateral akinesis, normal RV size with mildly decreased systolic function.  5. H/o cholecystectomy 6. OA 7. Depression 8. Type II diabetes 9. GERD 10. CKD stage 3 11. ?Early dementia  SH: Married, prior smoker (many years ago), lives in [place].    FH: CAD  ROS: All systems reviewed and negative except as per HPI.   Current Outpatient Medications  Medication Sig Dispense Refill  . allopurinol (ZYLOPRIM) 100 MG tablet Take 100 mg by mouth 2 (two) times daily.     Marland Kitchen aspirin EC 81 MG tablet Take 81 mg by mouth at bedtime.     Marland Kitchen atorvastatin (LIPITOR) 80 MG tablet TAKE 1 TABLET BY MOUTH DAILY AT 6:00 PM (Patient taking differently: Take 80 mg by mouth every evening. ) 90 tablet 6  . carvedilol (COREG) 25 MG tablet TAKE 1/2 (ONE-HALF) TABLET BY MOUTH TWICE DAILY WITH A MEAL (Patient taking differently: Take 12.5 mg by mouth 2 (two) times daily. ) 30 tablet 5  . clopidogrel (PLAVIX) 75 MG tablet Take 1 tablet (75 mg total) by mouth daily. 90 tablet 3  . furosemide (LASIX) 40 MG tablet Take 80 mg by mouth 2 (two) times daily.     . Insulin Glargine (TOUJEO SOLOSTAR) 300 UNIT/ML SOPN Inject 20 Units into the skin daily.     . insulin regular human CONCENTRATED (HUMULIN R U-500 KWIKPEN) 500 UNIT/ML kwikpen 30 units before BFST, 20 at lunch and 35 before supper, take 30-60 minutes before each meal (Patient taking differently: Inject 20-25 Units into the skin See admin instructions. 20 units in the morning, 25 at lunch, 20 at night., take 30-60 minutes before each meal) 2 pen 3  . losartan (COZAAR) 25 MG tablet Take 25 mg by mouth at bedtime.     Vladimir Faster Glycol-Propyl Glycol (LUBRICANT EYE DROPS) 0.4-0.3 %  SOLN Place 1 drop into both eyes 3 (three) times daily as needed (for dry/irritated eyes.).    Marland Kitchen QUEtiapine (SEROQUEL) 100 MG tablet Take 100 mg by mouth at bedtime.   3  . ranolazine (RANEXA) 1000 MG SR tablet Take 500 mg by mouth 2 (two) times daily.    Marland Kitchen triamcinolone cream (KENALOG) 0.1 % Apply 1 application topically 2 (two) times daily as needed (for dry/irritated skin.).    Marland Kitchen venlafaxine (EFFEXOR-XR) 150 MG 24 hr capsule Take 150 mg by mouth daily with breakfast.     . eplerenone (INSPRA) 50 MG tablet Take 1 tablet (50 mg total) by mouth daily. 30 tablet 6  . nitroGLYCERIN (NITROSTAT) 0.4 MG SL tablet DISSOLVE ONE TABLET UNDER THE TONGUE EVERY 5 MINUTES AS NEEDED  FOR CHEST PAIN.  DO NOT EXCEED A TOTAL OF 3 DOSES IN 15 MINUTES (Patient not taking: No sig reported) 25 tablet 3   No current facility-administered medications for this encounter.    BP 110/62   Pulse 68   Wt 91.4 kg (201 lb 6.4 oz)   SpO2 96%   BMI 28.90 kg/m    Wt Readings from Last 3 Encounters:  04/16/18 91.4 kg (201 lb 6.4 oz)  02/13/18 89.4 kg (197 lb)  01/28/18 90.2 kg (198 lb 12.8 oz)    General: NAD General: NAD Neck: No JVD, no thyromegaly or thyroid nodule.  Lungs: Clear to auscultation bilaterally with normal respiratory effort. CV: Nondisplaced PMI.  Heart regular S1/S2, no S3/S4, no murmur.  No peripheral edema.  No carotid bruit.  Normal pedal pulses.  Abdomen: Soft, nontender, no hepatosplenomegaly, no distention.  Skin: Intact without lesions or rashes.  Neurologic: Alert and oriented x 3.  Psych: Normal affect. Extremities: No clubbing or cyanosis.  HEENT: Normal.   Assessment/Plan: 1. Chronic systolic CHF: Ischemic cardiomyopathy.  NYHA class II symptoms, stable.  EF 30-35% on echo 3/19. He has a Medtronic CRT-D device.  NYHA class II symptoms.  He does not look volume overloaded by exam today. - I will continue Lasix at 80 mg bid. BMET today.   - Increase eplerenone to goal dose 50 mg daily.   BMET 10 days.  - BP did not tolerate Entresto.  Had ACEI cough.  Continue losartan 25 mg daily.   - Continue Coreg 12.5 mg bid. - Repeat echo at followup in 3 months.  2. CAD: s/p CABG and redo. Had DES to distal LM, angioplasty to ostial LCx in 4/17. Cardiolite 11/17 with scar, no ischemia.  No exertional chest pain, stable atypical chest pain pattern.    - Continue ASA 81, Plavix, statin.  - Continue Coreg and ranolazine 500 mg bid.  3. CKD stage III: BMET today.   4. Hyperlipidemia: Continue statin.      Followup in 3 months.   [name] 04/17/2018

## 2018-04-22 ENCOUNTER — Ambulatory Visit (INDEPENDENT_AMBULATORY_CARE_PROVIDER_SITE_OTHER): Payer: Medicare HMO

## 2018-04-22 DIAGNOSIS — Z95810 Presence of automatic (implantable) cardiac defibrillator: Secondary | ICD-10-CM

## 2018-04-22 DIAGNOSIS — I5042 Chronic combined systolic (congestive) and diastolic (congestive) heart failure: Secondary | ICD-10-CM

## 2018-04-23 DIAGNOSIS — Z794 Long term (current) use of insulin: Secondary | ICD-10-CM | POA: Diagnosis not present

## 2018-04-23 DIAGNOSIS — E1121 Type 2 diabetes mellitus with diabetic nephropathy: Secondary | ICD-10-CM | POA: Diagnosis not present

## 2018-04-23 NOTE — Progress Notes (Addendum)
EPIC Encounter for ICM Monitoring  Patient Name: [name] is a 78 y.o. male Date: 04/23/2018 Primary Care Physican: Mayra Neer, MD Primary Cardiologist:[place] Electrophysiologist: Allred Bi-V Pacing: 96.4% Baseline weight: 196 lbs Last Weight: 201 lbs (04/16/2018 HF clinic office visit) Today's Weight: unknown      Transmission reviewed.  Report: Thoracic impedance normal since long term Furosemide dosage was increased to 80 mg twice a day 04/08/2018.   Prescribed: Furosemide40 mg2 tablets(80 mg total) by mouthtwo times a day.  Labs: 04/16/2018 Creatinine 1.60, BUN 30, Potassium 3.9, Sodium 139, eGFR 42-48 01/28/2018 Creatinine 1.44, BUN 35, Potassium 4.3, Sodium 138, eGFR 47-55 01/06/2018 Creatinine 1.60, BUN 30, Potassium 4.4, Sodium 140, EGFR 41-47 10/04/2017 Creatinine 1.70, BUN 40, Potassium 3.9, Sodium 138, EGFR 38-44 09/25/2017 Creatinine1.50, BUN40, Potassium4.0, Sodium139, [id number] 09/24/2017 Creatinine2.15, BUN49, Potassium4.2,Sodium 135, EGFR 28-33 @ 1:57AM  09/24/2017 Creatinine1.00, BUN41, Potassium4.0, Sodium133 @ 1:56 AM  09/11/2017 Creatinine1.64, BUN33, Potassium4.0, Sodium135, [id number]  Recommendations: None.  Follow-up plan: ICM clinic phone appointment on 06/18/2018.  Office visit with Dr Rayann Heman 05/15/2018  Copy of ICM check sent to Dr. Rayann Heman.   3 month ICM trend: 04/22/2018    1 Year ICM trend:       [name], RN 04/23/2018 10:29 AM

## 2018-04-26 ENCOUNTER — Ambulatory Visit (HOSPITAL_COMMUNITY)
Admission: RE | Admit: 2018-04-26 | Discharge: 2018-04-26 | Disposition: A | Discharge status: Home / Self Care | Payer: Medicare HMO | Source: Ambulatory Visit | Attending: Cardiology | Admitting: Cardiology

## 2018-04-26 ENCOUNTER — Telehealth (HOSPITAL_COMMUNITY): Payer: Self-pay

## 2018-04-26 DIAGNOSIS — I5042 Chronic combined systolic (congestive) and diastolic (congestive) heart failure: Secondary | ICD-10-CM | POA: Insufficient documentation

## 2018-04-26 LAB — BASIC METABOLIC PANEL
Anion gap: 10 (ref 5–15)
BUN: 39 mg/dL — ABNORMAL HIGH (ref 8–23)
CHLORIDE: 99 mmol/L (ref 98–111)
CO2: 26 mmol/L (ref 22–32)
Calcium: 9.2 mg/dL (ref 8.9–10.3)
Creatinine, Ser: 1.91 mg/dL — ABNORMAL HIGH (ref 0.61–1.24)
GFR calc non Af Amer: 34 mL/min — ABNORMAL LOW (ref >60)
GFR, EST AFRICAN AMERICAN: 39 mL/min — AB (ref >60)
GLUCOSE: 261 mg/dL — AB (ref 70–99)
Potassium: 3.9 mmol/L (ref 3.5–5.1)
Sodium: 135 mmol/L (ref 135–145)

## 2018-04-26 NOTE — Telephone Encounter (Signed)
Pt called given results verbalized understanding medication change lab work scheduled for 05/03/2018

## 2018-05-03 ENCOUNTER — Ambulatory Visit (HOSPITAL_COMMUNITY)
Admission: RE | Admit: 2018-05-03 | Discharge: 2018-05-03 | Disposition: A | Discharge status: Home / Self Care | Payer: Medicare HMO | Source: Ambulatory Visit | Attending: Internal Medicine | Admitting: Internal Medicine

## 2018-05-03 DIAGNOSIS — I5042 Chronic combined systolic (congestive) and diastolic (congestive) heart failure: Secondary | ICD-10-CM | POA: Insufficient documentation

## 2018-05-03 LAB — BASIC METABOLIC PANEL
ANION GAP: 9 (ref 5–15)
BUN: 27 mg/dL — AB (ref 8–23)
CO2: 27 mmol/L (ref 22–32)
CREATININE: 1.65 mg/dL — AB (ref 0.61–1.24)
Calcium: 9.1 mg/dL (ref 8.9–10.3)
Chloride: 104 mmol/L (ref 98–111)
GFR calc non Af Amer: 40 mL/min — ABNORMAL LOW (ref >60)
GFR, EST AFRICAN AMERICAN: 46 mL/min — AB (ref >60)
Glucose, Bld: 160 mg/dL — ABNORMAL HIGH (ref 70–99)
POTASSIUM: 4 mmol/L (ref 3.5–5.1)
Sodium: 140 mmol/L (ref 135–145)

## 2018-05-15 ENCOUNTER — Encounter: Payer: Self-pay | Admitting: Internal Medicine

## 2018-05-15 ENCOUNTER — Ambulatory Visit: Payer: Medicare HMO | Admitting: Internal Medicine

## 2018-05-15 VITALS — BP 88/50 | HR 75 | Ht 70.0 in | Wt 200.2 lb

## 2018-05-15 DIAGNOSIS — Z95810 Presence of automatic (implantable) cardiac defibrillator: Secondary | ICD-10-CM

## 2018-05-15 DIAGNOSIS — I5022 Chronic systolic (congestive) heart failure: Secondary | ICD-10-CM | POA: Diagnosis not present

## 2018-05-15 DIAGNOSIS — I10 Essential (primary) hypertension: Secondary | ICD-10-CM | POA: Diagnosis not present

## 2018-05-15 DIAGNOSIS — I255 Ischemic cardiomyopathy: Secondary | ICD-10-CM | POA: Diagnosis not present

## 2018-05-15 DIAGNOSIS — I480 Paroxysmal atrial fibrillation: Secondary | ICD-10-CM

## 2018-05-15 NOTE — Progress Notes (Signed)
PCP: Mayra Neer, MD Primary Cardiologist: Dr Radford Pax Primary EP: Dr Mariane Duval [name] is a 78 y.o. male who presents today for routine electrophysiology followup.  Since last being seen in our clinic, the patient reports doing very well.  Today, he denies symptoms of palpitations, chest pain, shortness of breath,  lower extremity edema, dizziness, presyncope, syncope, or ICD shocks.  The patient is otherwise without complaint today.   Past Medical History:  Diagnosis Date  . CAD (coronary artery disease)    a. s/p CABG 1997 b. PCI (BMS) of SVG to RCA 3/09 c. redo CABG 02/2011 with SVG to PDA, SVG to Lcx d. Cath 07/2013: ischemic cardiomyopathy with LVEF less than 20%, occlusion of the SVG's placed in 2012 e.stenting of LM in 07/2015 with PCI of LCx performed as well  . CHF (congestive heart failure) ([place])   . Chronic systolic dysfunction of left ventricle    EF 30%  . Depression   . DJD (degenerative joint disease)   . DM (diabetes mellitus) ([place]) 02/14/2011  . GERD (gastroesophageal reflux disease)   . Gout   . HTN (hypertension) 02/14/2011  . Hyperlipemia   . Ischemic cardiomyopathy    s/p ICD Implantation by Dr Leonia Reeves  . Paroxysmal atrial fibrillation ([place]) 10/22/2014   single episode of AF x 3 hours 48 minutes recorded on ICD, chads2vasc score is at least 5   . Renal disorder    Past Surgical History:  Procedure Laterality Date  . BI-VENTRICULAR IMPLANTABLE CARDIOVERTER DEFIBRILLATOR UPGRADE N/A 08/24/2011   Procedure: BI-VENTRICULAR IMPLANTABLE CARDIOVERTER DEFIBRILLATOR UPGRADE;  Surgeon: Evans Lance, MD;  Location: [facility] CATH LAB;  Service: Cardiovascular;  Laterality: N/A;  . BIV ICD GENERATOR CHANGEOUT N/A 02/13/2018   Procedure: BIV ICD GENERATOR CHANGEOUT;  Surgeon: Thompson Grayer, MD;  Location: [facility] CV LAB;  Service: Cardiovascular;  Laterality: N/A;  . CARDIAC CATHETERIZATION  08/03/2015   Procedure: Left Heart Cath and Cors/Grafts Angiography;  Surgeon:  Peter M Martinique, MD;  Location: [place] CV LAB;  Service: Cardiovascular;;  . CARDIAC CATHETERIZATION N/A 08/06/2015   Procedure: Coronary Stent Intervention w/Impella;  Surgeon: Jettie Booze, MD;  Location: [place] CV LAB;  Service: Cardiovascular;  Laterality: N/A;  . CARDIAC CATHETERIZATION  08/06/2015   Procedure: Left Heart Cath;  Surgeon: Jettie Booze, MD;  Location: [place] CV LAB;  Service: Cardiovascular;;  . CARDIAC CATHETERIZATION  08/06/2015   Procedure: Coronary Balloon Angioplasty;  Surgeon: Jettie Booze, MD;  Location: [place] CV LAB;  Service: Cardiovascular;;  . CARDIAC DEFIBRILLATOR PLACEMENT  08/2004   initial placement, upgraded to [place] ICD by Dr Lovena Le 08/24/11 (MDT)  . CATARACT EXTRACTION W/ INTRAOCULAR LENS  IMPLANT, BILATERAL  2012  . CHOLECYSTECTOMY  01/05/2012   Procedure: LAPAROSCOPIC CHOLECYSTECTOMY WITH INTRAOPERATIVE CHOLANGIOGRAM;  Surgeon: Joyice Faster. Cornett, MD;  Location: [place];  Service: General;  Laterality: N/A;  laparoscopic cholecysectoym with intraoperative cholangiogram  . COLONOSCOPY WITH PROPOFOL N/A 11/18/2013   Procedure: COLONOSCOPY WITH PROPOFOL;  Surgeon: Garlan Fair, MD;  Location: [facility] ENDOSCOPY;  Service: Endoscopy;  Laterality: N/A;  . CORONARY ANGIOPLASTY WITH STENT PLACEMENT  06/2007   BMS to SVG to RCA  . CORONARY ARTERY BYPASS GRAFT  01/1996   CABG x 5 LIMA to LAD SVG to diag1,2,svg to om,svg to RCA  . CORONARY ARTERY BYPASS GRAFT  03/06/2011   CABG X2; Procedure: REDO CORONARY ARTERY BYPASS GRAFTING (CABG);  Surgeon: Grace Isaac, MD;  Location:  [facility] OR;  Service: Open Heart Surgery;  Laterality: N/A;  times two grafts using right saphenous vein harvested endoscopically.  Marland Kitchen KNEE ARTHROTOMY  ~ 1978   RIGHT KNEE CARTILAGE REMOVED  . LEFT HEART CATHETERIZATION WITH CORONARY ANGIOGRAM N/A 06/06/2011   Procedure: LEFT HEART CATHETERIZATION WITH CORONARY ANGIOGRAM;  Surgeon: Sueanne Margarita, MD;  Location: [place] CATH  LAB;  Service: Cardiovascular;  Laterality: N/A;  . LEFT HEART CATHETERIZATION WITH CORONARY/GRAFT ANGIOGRAM N/A 08/08/2013   Procedure: LEFT HEART CATHETERIZATION WITH Beatrix Fetters;  Surgeon: Sinclair Grooms, MD;  Location: [facility] CATH LAB;  Service: Cardiovascular;  Laterality: N/A;  . LUMBAR [place] SURGERY  2003  . RIGHT HEART CATHETERIZATION N/A 02/17/2014   Procedure: RIGHT HEART CATH;  Surgeon: Larey Dresser, MD;  Location: [facility] CATH LAB;  Service: Cardiovascular;  Laterality: N/A;  . VENOGRAM N/A 06/08/2011   Procedure: VENOGRAM;  Surgeon: Thompson Grayer, MD;  Location: [facility] CATH LAB;  Service: Cardiovascular;  Laterality: N/A;    ROS- all systems are reviewed and negative except as per HPI above  Current Outpatient Medications  Medication Sig Dispense Refill  . allopurinol (ZYLOPRIM) 100 MG tablet Take 100 mg by mouth 2 (two) times daily.     Marland Kitchen aspirin EC 81 MG tablet Take 81 mg by mouth at bedtime.     Marland Kitchen atorvastatin (LIPITOR) 80 MG tablet TAKE 1 TABLET BY MOUTH DAILY AT 6:00 PM (Patient taking differently: Take 80 mg by mouth every evening. ) 90 tablet 6  . carvedilol (COREG) 25 MG tablet TAKE 1/2 (ONE-HALF) TABLET BY MOUTH TWICE DAILY WITH A MEAL (Patient taking differently: Take 12.5 mg by mouth 2 (two) times daily. ) 30 tablet 5  . clopidogrel (PLAVIX) 75 MG tablet Take 1 tablet (75 mg total) by mouth daily. 90 tablet 3  . eplerenone (INSPRA) 50 MG tablet Take 1 tablet (50 mg total) by mouth daily. 30 tablet 6  . furosemide (LASIX) 40 MG tablet Take 40 mg by mouth daily. Use as directed    . Insulin Glargine (TOUJEO SOLOSTAR) 300 UNIT/ML SOPN Inject 20 Units into the skin daily.     . insulin regular human CONCENTRATED (HUMULIN R U-500 KWIKPEN) 500 UNIT/ML kwikpen 30 units before BFST, 20 at lunch and 35 before supper, take 30-60 minutes before each meal (Patient taking differently: Inject 20-25 Units into the skin See admin instructions. 20 units in the morning, 25 at lunch, 20  at night., take 30-60 minutes before each meal) 2 pen 3  . losartan (COZAAR) 25 MG tablet Take 25 mg by mouth at bedtime.     . nitroGLYCERIN (NITROSTAT) 0.4 MG SL tablet DISSOLVE ONE TABLET UNDER THE TONGUE EVERY 5 MINUTES AS NEEDED FOR CHEST PAIN.  DO NOT EXCEED A TOTAL OF 3 DOSES IN 15 MINUTES 25 tablet 3  . Polyethyl Glycol-Propyl Glycol (LUBRICANT EYE DROPS) 0.4-0.3 % SOLN Place 1 drop into both eyes 3 (three) times daily as needed (for dry/irritated eyes.).    Marland Kitchen QUEtiapine (SEROQUEL) 100 MG tablet Take 100 mg by mouth at bedtime.   3  . ranolazine (RANEXA) 1000 MG SR tablet Take 500 mg by mouth 2 (two) times daily.    Marland Kitchen triamcinolone cream (KENALOG) 0.1 % Apply 1 application topically 2 (two) times daily as needed (for dry/irritated skin.).    Marland Kitchen venlafaxine (EFFEXOR-XR) 150 MG 24 hr capsule Take 150 mg by mouth daily with breakfast.      No current facility-administered medications for this visit.  Physical Exam: Vitals:   05/15/18 1641  BP: (!) 88/50  Pulse: 75  SpO2: 98%  Weight: 200 lb 3.2 oz (90.8 kg)  Height: 5\' 10"  (1.778 m)    GEN- The patient is well appearing, alert and oriented x 3 today.   Head- normocephalic, atraumatic Eyes-  Sclera clear, conjunctiva pink Ears- hearing intact Oropharynx- clear Lungs- Clear to ausculation bilaterally, normal work of breathing Chest- ICD pocket is well healed Heart- Regular rate and rhythm, no murmurs, rubs or gallops, PMI not laterally displaced GI- soft, NT, ND, + BS Extremities- no clubbing, cyanosis, or edema  ICD interrogation- reviewed in detail today,  See PACEART report  ekg tracing ordered today is personally reviewed and shows sinus with BiV pacing  Wt Readings from Last 3 Encounters:  05/15/18 200 lb 3.2 oz (90.8 kg)  04/16/18 201 lb 6.4 oz (91.4 kg)  02/13/18 197 lb (89.4 kg)    Assessment and Plan:  1.  Chronic systolic dysfunction/ ischemic CM/ CAD euvolemic today Stable on an appropriate medical  regimen Normal ICD function See Pace Art report No changes today  followed in ICM device clinic  2. HTN Stable No change required today  3. Paroxysmal atrial fibrillaton AF burden is 0% Avoid anticoagulation unless AF burden increases significantly  carelink Return to se EP NP in a year Follow-up with Dr Radford Pax as scheduled  Thompson Grayer MD, [facility] 05/15/2018 5:00 PM

## 2018-05-15 NOTE — Patient Instructions (Signed)
Medication Instructions:  Your physician recommends that you continue on your current medications as directed. Please refer to the Current Medication list given to you today.  Labwork: None ordered.  Testing/Procedures: None ordered.  Follow-Up: Your physician wants you to follow-up in: one year with Chanetta Marshall, NP.   You will receive a reminder letter in the mail two months in advance. If you don't receive a letter, please call our office to schedule the follow-up appointment.  Remote monitoring is used to monitor your ICD from home. This monitoring reduces the number of office visits required to check your device to one time per year. It allows [place] to keep an eye on the functioning of your device to ensure it is working properly. You are scheduled for a device check from home on 08/14/2018. You may send your transmission at any time that day. If you have a wireless device, the transmission will be sent automatically. After your physician reviews your transmission, you will receive a postcard with your next transmission date.  Any Other Special Instructions Will Be Listed Below (If Applicable).  If you need a refill on your cardiac medications before your next appointment, please call your pharmacy.

## 2018-05-16 LAB — CUP PACEART INCLINIC DEVICE CHECK
Battery Remaining Longevity: 100 mo
Battery Voltage: 3 V
Brady Statistic AP VP Percent: 3.82 %
Brady Statistic AS VP Percent: 95.56 %
Brady Statistic RA Percent Paced: 3.76 %
Brady Statistic RV Percent Paced: 96.03 %
Date Time Interrogation Session: 20200129225011
HighPow Impedance: 45 Ohm
HighPow Impedance: 61 Ohm
Implantable Lead Implant Date: 20111003
Implantable Lead Location: 753859
Implantable Lead Location: 753860
Implantable Lead Model: 4196
Implantable Lead Model: 5076
Implantable Lead Model: 6947
Lead Channel Impedance Value: 399 Ohm
Lead Channel Impedance Value: 399 Ohm
Lead Channel Impedance Value: 608 Ohm
Lead Channel Impedance Value: 703 Ohm
Lead Channel Pacing Threshold Amplitude: 0.5 V
Lead Channel Pacing Threshold Amplitude: 1.875 V
Lead Channel Sensing Intrinsic Amplitude: 1.375 mV
Lead Channel Sensing Intrinsic Amplitude: 1.625 mV
Lead Channel Sensing Intrinsic Amplitude: 31.625 mV
Lead Channel Setting Pacing Amplitude: 1.25 V
Lead Channel Setting Pacing Amplitude: 2 V
Lead Channel Setting Pacing Amplitude: 3.5 V
Lead Channel Setting Pacing Pulse Width: 0.4 ms
Lead Channel Setting Sensing Sensitivity: 0.3 mV
MDC IDC LEAD IMPLANT DT: 20111003
MDC IDC LEAD IMPLANT DT: 20130509
MDC IDC LEAD LOCATION: 753858
MDC IDC MSMT LEADCHNL LV PACING THRESHOLD AMPLITUDE: 0.75 V
MDC IDC MSMT LEADCHNL LV PACING THRESHOLD PULSEWIDTH: 0.4 ms
MDC IDC MSMT LEADCHNL RA IMPEDANCE VALUE: 456 Ohm
MDC IDC MSMT LEADCHNL RA PACING THRESHOLD PULSEWIDTH: 0.4 ms
MDC IDC MSMT LEADCHNL RV IMPEDANCE VALUE: 494 Ohm
MDC IDC MSMT LEADCHNL RV PACING THRESHOLD PULSEWIDTH: 0.4 ms
MDC IDC MSMT LEADCHNL RV SENSING INTR AMPL: 31.625 mV
MDC IDC PG IMPLANT DT: 20191030
MDC IDC SET LEADCHNL LV PACING PULSEWIDTH: 0.4 ms
MDC IDC STAT BRADY AP VS PERCENT: 0.02 %
MDC IDC STAT BRADY AS VS PERCENT: 0.6 %

## 2018-05-26 ENCOUNTER — Other Ambulatory Visit (HOSPITAL_COMMUNITY): Payer: Self-pay | Admitting: Cardiology

## 2018-05-28 DIAGNOSIS — Z7984 Long term (current) use of oral hypoglycemic drugs: Secondary | ICD-10-CM | POA: Diagnosis not present

## 2018-05-28 DIAGNOSIS — N179 Acute kidney failure, unspecified: Secondary | ICD-10-CM | POA: Diagnosis not present

## 2018-05-28 DIAGNOSIS — E1121 Type 2 diabetes mellitus with diabetic nephropathy: Secondary | ICD-10-CM | POA: Diagnosis not present

## 2018-06-10 DIAGNOSIS — N179 Acute kidney failure, unspecified: Secondary | ICD-10-CM | POA: Diagnosis not present

## 2018-06-18 ENCOUNTER — Ambulatory Visit (INDEPENDENT_AMBULATORY_CARE_PROVIDER_SITE_OTHER): Payer: Medicare HMO

## 2018-06-18 DIAGNOSIS — I5022 Chronic systolic (congestive) heart failure: Secondary | ICD-10-CM | POA: Diagnosis not present

## 2018-06-18 DIAGNOSIS — Z95810 Presence of automatic (implantable) cardiac defibrillator: Secondary | ICD-10-CM

## 2018-06-19 NOTE — Progress Notes (Signed)
EPIC Encounter for ICM Monitoring  Patient Name: [name] is a 78 y.o. male Date: 06/19/2018 Primary Care Physican: Mayra Neer, MD Primary Cardiologist:[place] Electrophysiologist: Allred Bi-V Pacing: 96.2% Baselineweight: 196 lbs Last Weight:200lbs (05/15/2018 office visit) Today's Weight: 198                                                          Heart failure questions reviewed with wife and patient.  Patient asymptomatic.  BP today 127/74.   Wife reported Dr Brigitte Pulse (PCP) stopped Losartan 4 weeks ago [name] to kidney function.  Advised would inform Dr Aundra Dubin of change.    Report: Thoracic impedance normal.  Prescribed: Furosemide40 mg1 tablet(40 mg total) by mouthdaily.  Labs: 05/03/2018 Creatinine 1.65, BUN 27, Potassium 4.0, Sodium 140, GFR 40-46 04/26/2018 Creatinine 1.91, BUN 39, Potassium 3.9, Sodium 135, GFR 34-39  04/16/2018 Creatinine 1.60, BUN 30, Potassium 3.9, Sodium 139, GFR 42-48 01/28/2018 Creatinine 1.44, BUN 35, Potassium 4.3, Sodium 138, GFR 47-55 01/06/2018 Creatinine 1.60, BUN 30, Potassium 4.4, Sodium 140, GFR 41-47 10/04/2017 Creatinine 1.70, BUN 40, Potassium 3.9, Sodium 138, GFR 38-44 09/25/2017 Creatinine1.50, BUN40, Potassium4.0, Sodium139, GFR44-51 09/24/2017 Creatinine2.15, BUN49, Potassium4.2,Sodium 135, GFR 28-33 @ 1:57AM  09/24/2017 Creatinine1.00, BUN41, Potassium4.0, Sodium133 @ 1:56 AM  09/11/2017 Creatinine1.64, BUN33, Potassium4.0, Sodium135, GSU11-03  Recommendations:  No changes and encouraged to call for fluid symptoms.  Follow-up plan: ICM clinic phone appointment on 07/22/2018.  Office visit with Dr Aundra Dubin 07/10/2018.  Copy of ICM check sent to Dr. Rayann Heman and Dr Aundra Dubin to inform Losartan has been stopped by PCP for past month.   3 month ICM trend: 06/18/2018    1 Year ICM trend:       [name], RN 06/19/2018 11:52 AM

## 2018-06-20 DIAGNOSIS — I130 Hypertensive heart and chronic kidney disease with heart failure and stage 1 through stage 4 chronic kidney disease, or unspecified chronic kidney disease: Secondary | ICD-10-CM | POA: Diagnosis not present

## 2018-06-20 DIAGNOSIS — N179 Acute kidney failure, unspecified: Secondary | ICD-10-CM | POA: Diagnosis not present

## 2018-06-20 DIAGNOSIS — I25119 Atherosclerotic heart disease of native coronary artery with unspecified angina pectoris: Secondary | ICD-10-CM | POA: Diagnosis not present

## 2018-06-27 DIAGNOSIS — N183 Chronic kidney disease, stage 3 (moderate): Secondary | ICD-10-CM | POA: Diagnosis not present

## 2018-06-27 DIAGNOSIS — Z7984 Long term (current) use of oral hypoglycemic drugs: Secondary | ICD-10-CM | POA: Diagnosis not present

## 2018-06-27 DIAGNOSIS — E7800 Pure hypercholesterolemia, unspecified: Secondary | ICD-10-CM | POA: Diagnosis not present

## 2018-06-27 DIAGNOSIS — L409 Psoriasis, unspecified: Secondary | ICD-10-CM | POA: Diagnosis not present

## 2018-06-27 DIAGNOSIS — E1121 Type 2 diabetes mellitus with diabetic nephropathy: Secondary | ICD-10-CM | POA: Diagnosis not present

## 2018-06-27 DIAGNOSIS — I130 Hypertensive heart and chronic kidney disease with heart failure and stage 1 through stage 4 chronic kidney disease, or unspecified chronic kidney disease: Secondary | ICD-10-CM | POA: Diagnosis not present

## 2018-06-27 DIAGNOSIS — I5022 Chronic systolic (congestive) heart failure: Secondary | ICD-10-CM | POA: Diagnosis not present

## 2018-06-27 DIAGNOSIS — I25119 Atherosclerotic heart disease of native coronary artery with unspecified angina pectoris: Secondary | ICD-10-CM | POA: Diagnosis not present

## 2018-07-03 ENCOUNTER — Telehealth (HOSPITAL_COMMUNITY): Payer: Self-pay

## 2018-07-03 NOTE — Telephone Encounter (Signed)
Called patient to reschedule appointments currently on 3/25 d/t current state of emergency with [place] virus.  Spoke with patients wife and notes and reports pt is feeling well with no exposure to anyone with a fever or cough.  No recent travel.  Pt denies cardiac symptoms currently.   Amendable to rescheduling appt from next week.  Pt encouraged to call office if any concerns at home ie change in status, refills etc. Appointment made for June for ECHO and MD appointment and appointment card mailed. Verbalized understanding.

## 2018-07-10 ENCOUNTER — Telehealth: Payer: Self-pay | Admitting: *Deleted

## 2018-07-10 ENCOUNTER — Encounter (HOSPITAL_COMMUNITY): Payer: Medicare HMO | Admitting: Cardiology

## 2018-07-10 ENCOUNTER — Other Ambulatory Visit (HOSPITAL_COMMUNITY): Payer: Medicare HMO

## 2018-07-10 ENCOUNTER — Encounter: Payer: Self-pay | Admitting: Internal Medicine

## 2018-07-10 NOTE — Progress Notes (Addendum)
Updated transmission received 07/09/28 at 16:28. AF episode ongoing since 07/08/18 at 18:44.     Patient to start anticoagulation and then have follow-up virtual visit with me next week. Thompson Grayer MD, [place] 07/13/2018 4:47 PM

## 2018-07-10 NOTE — Telephone Encounter (Signed)
Spoke with patient and wife regarding AF episode on 07/09/18. Pt denies symptoms with episode. Agrees to send a transmission to see if AF episode is ongoing. Will plan to send to Dr. Rayann Heman for review and recommendations (if any). Pt and wife agreeable to this plan.

## 2018-07-10 NOTE — Telephone Encounter (Signed)
Spoke w/ pt and requested that he send a manual transmission. Dr. Brigitte Pulse discontinued Losartan 25 MG QD about 6 weeks ago. Furosemide was changed to 40 mg daily about a week ago and that is around the time this started.

## 2018-07-10 NOTE — Progress Notes (Signed)
Received triage alert for AF burden >6hrs/24hrs. Presenting rhythm as of 07/09/18 at 0600 showed AF/BiVp. Onset 07/08/18 @ 0998, ongoing at time of transmission. BiV paced 95.5% (effective 95.4%). Rates controlled. Normal device function. No ventricular arrhythmias. History of PAF, not currently on [place] due to low AF burden per Dr. Jackalyn Lombard 05/15/18 OV note.   Spoke with patient and wife. Pt not symptomatic with episode. Sending manual transmission 07/10/18 to reassess rhythm. Routed to Dr. Rayann Heman for review and recommendations.

## 2018-07-11 ENCOUNTER — Other Ambulatory Visit: Payer: Self-pay | Admitting: Internal Medicine

## 2018-07-11 NOTE — Telephone Encounter (Signed)
Transmission data routed to Dr. Rayann Heman for review via documentation encounter.

## 2018-07-12 ENCOUNTER — Telehealth: Payer: Self-pay | Admitting: Internal Medicine

## 2018-07-12 DIAGNOSIS — I480 Paroxysmal atrial fibrillation: Secondary | ICD-10-CM

## 2018-07-12 MED ORDER — APIXABAN 5 MG PO TABS: 5.0000 mg | ORAL_TABLET | Freq: Two times a day (BID) | ORAL | 11 refills | Status: DC

## 2018-07-12 NOTE — Telephone Encounter (Signed)
See phone note form 07/12/18--started on Eliquis 5mg  BID, ASA d/c. Plan for f/u on Monday, 07/15/18.

## 2018-07-12 NOTE — Telephone Encounter (Signed)
Spoke with patient's wife to update her regarding plan. She verbalizes understanding of transmission findings and Dr. Jackalyn Lombard instructions to STOP ASA, CONTINUE Plavix, and START Eliquis 5mg  BID. Prescription sent electronically to preferred local pharmacy as 30 day supply. Discussed signs/symptoms of bleeding and instructed to call for any issues with this medication. Plan for virtual visit on Monday with Dr. Rayann Heman (telephone only--no access to smart technology). Pt's wife denies additional questions or concerns at this time.

## 2018-07-12 NOTE — Telephone Encounter (Signed)
-----   Message from Thompson Grayer, MD sent at 07/12/2018  3:42 PM EDT ----- Given ongoing afib, we should start anticoagulation.  Please start eliquis 5mg  BID today and schedule virtual visit with me on Monday. Stop ASA in the meantime.  ----- Message ----- From: Patsey Berthold, NP Sent: 07/11/2018   3:50 PM EDT To: Thompson Grayer, MD  Received triage alert for AF burden >6hrs/24hrs. Presenting rhythm as of 07/09/18 at 0600 showed AF/BiVp. Onset 07/08/18 @ 4403, ongoing at time of transmission. BiV paced 95.5% (effective 95.4%). Rates controlled. Normal device function. No ventricular arrhythmias. History of PAF, not currently on [place] due to low AF burden per Dr. Jackalyn Lombard 05/15/18 OV note.   Spoke with patient and wife. Pt not symptomatic with episode. Sending manual transmission 07/10/18 to reassess rhythm. Routed to Dr. Rayann Heman for review and recommendations.  Updated transmission 3/26, AF episode ongoing.  Chanetta Marshall, NP 07/11/2018 3:50 PM

## 2018-07-12 NOTE — Telephone Encounter (Signed)
Discussed briefly with Dr. Allred--currently awaiting plan.

## 2018-07-12 NOTE — Telephone Encounter (Signed)
New Message   Patient's wife states he wants to make sure transmission went through since they never received a phone call.

## 2018-07-15 ENCOUNTER — Telehealth (INDEPENDENT_AMBULATORY_CARE_PROVIDER_SITE_OTHER): Payer: Medicare HMO | Admitting: Internal Medicine

## 2018-07-15 ENCOUNTER — Encounter: Payer: Self-pay | Admitting: Internal Medicine

## 2018-07-15 DIAGNOSIS — N183 Chronic kidney disease, stage 3 unspecified: Secondary | ICD-10-CM

## 2018-07-15 DIAGNOSIS — I255 Ischemic cardiomyopathy: Secondary | ICD-10-CM | POA: Diagnosis not present

## 2018-07-15 DIAGNOSIS — I4819 Other persistent atrial fibrillation: Secondary | ICD-10-CM

## 2018-07-15 NOTE — Progress Notes (Signed)
Electrophysiology TeleHealth Note   Due to national recommendations of social distancing due to [place] 19, an audio telehealth visit is felt to be most appropriate for this patient at this time.  Verbal consent was obtained for the visit today.  Date:  07/15/2018   ID:  [name], DOB 1941/01/24, MRN [id number]  Location: patient's home  Provider location: [address] [place]  Evaluation Performed: Follow-up visit  PCP:  Mayra Neer, MD  Cardiologist:  Dr Radford Pax Electrophysiologist:  Dr Rayann Heman  Chief Complaint:  afib  History of Present Illness:    [name] is a 78 y.o. male who presents via audio conferencing for a telehealth visit today.  Since last being seen in our clinic, the patient reports doing very well.  He states "I feel good".  He is outdoors working.  His wife interjects that he has been "very tired" for a couple days.  He had some shortness of breath last week, none today. Today, he denies symptoms of palpitations, chest pain,  lower extremity edema, dizziness, presyncope, or syncope.  The patient is otherwise without complaint today.  The patient denies symptoms of fevers, chills, cough, or new SOB worrisome for COVID 19.  Past Medical History:  Diagnosis Date  . CAD (coronary artery disease)    a. s/p CABG 1997 b. PCI (BMS) of SVG to RCA 3/09 c. redo CABG 02/2011 with SVG to PDA, SVG to Lcx d. Cath 07/2013: ischemic cardiomyopathy with LVEF less than 20%, occlusion of the SVG's placed in 2012 e.stenting of LM in 07/2015 with PCI of LCx performed as well  . CHF (congestive heart failure) ([place])   . Chronic systolic dysfunction of left ventricle    EF 30%  . Depression   . DJD (degenerative joint disease)   . DM (diabetes mellitus) ([place]) 02/14/2011  . GERD (gastroesophageal reflux disease)   . Gout   . HTN (hypertension) 02/14/2011  . Hyperlipemia   . Ischemic cardiomyopathy    s/p ICD Implantation by Dr Leonia Reeves  . Paroxysmal atrial  fibrillation ([place]) 10/22/2014   single episode of AF x 3 hours 48 minutes recorded on ICD, chads2vasc score is at least 5   . Renal disorder     Past Surgical History:  Procedure Laterality Date  . BI-VENTRICULAR IMPLANTABLE CARDIOVERTER DEFIBRILLATOR UPGRADE N/A 08/24/2011   Procedure: BI-VENTRICULAR IMPLANTABLE CARDIOVERTER DEFIBRILLATOR UPGRADE;  Surgeon: Evans Lance, MD;  Location: [facility] CATH LAB;  Service: Cardiovascular;  Laterality: N/A;  . BIV ICD GENERATOR CHANGEOUT N/A 02/13/2018   Procedure: BIV ICD GENERATOR CHANGEOUT;  Surgeon: Thompson Grayer, MD;  Location: [place] CV LAB;  Service: Cardiovascular;  Laterality: N/A;  . CARDIAC CATHETERIZATION  08/03/2015   Procedure: Left Heart Cath and Cors/Grafts Angiography;  Surgeon: Peter M Martinique, MD;  Location: [place] CV LAB;  Service: Cardiovascular;;  . CARDIAC CATHETERIZATION N/A 08/06/2015   Procedure: Coronary Stent Intervention w/Impella;  Surgeon: Jettie Booze, MD;  Location: [facility] CV LAB;  Service: Cardiovascular;  Laterality: N/A;  . CARDIAC CATHETERIZATION  08/06/2015   Procedure: Left Heart Cath;  Surgeon: Jettie Booze, MD;  Location: [place] CV LAB;  Service: Cardiovascular;;  . CARDIAC CATHETERIZATION  08/06/2015   Procedure: Coronary Balloon Angioplasty;  Surgeon: Jettie Booze, MD;  Location: [place] CV LAB;  Service: Cardiovascular;;  . CARDIAC DEFIBRILLATOR PLACEMENT  08/2004   initial placement, upgraded to [place] ICD by Dr Lovena Le 08/24/11 (MDT)  .  CATARACT EXTRACTION W/ INTRAOCULAR LENS  IMPLANT, BILATERAL  2012  . CHOLECYSTECTOMY  01/05/2012   Procedure: LAPAROSCOPIC CHOLECYSTECTOMY WITH INTRAOPERATIVE CHOLANGIOGRAM;  Surgeon: Joyice Faster. Cornett, MD;  Location: [place];  Service: General;  Laterality: N/A;  laparoscopic cholecysectoym with intraoperative cholangiogram  . COLONOSCOPY WITH PROPOFOL N/A 11/18/2013   Procedure: COLONOSCOPY WITH PROPOFOL;  Surgeon: Garlan Fair, MD;  Location:  [facility];  Service: Endoscopy;  Laterality: N/A;  . CORONARY ANGIOPLASTY WITH STENT PLACEMENT  06/2007   BMS to SVG to RCA  . CORONARY ARTERY BYPASS GRAFT  01/1996   CABG x 5 LIMA to LAD SVG to diag1,2,svg to om,svg to RCA  . CORONARY ARTERY BYPASS GRAFT  03/06/2011   CABG X2; Procedure: REDO CORONARY ARTERY BYPASS GRAFTING (CABG);  Surgeon: Grace Isaac, MD;  Location: [facility];  Service: Open Heart Surgery;  Laterality: N/A;  times two grafts using right saphenous vein harvested endoscopically.  Marland Kitchen KNEE ARTHROTOMY  ~ 1978   RIGHT KNEE CARTILAGE REMOVED  . LEFT HEART CATHETERIZATION WITH CORONARY ANGIOGRAM N/A 06/06/2011   Procedure: LEFT HEART CATHETERIZATION WITH CORONARY ANGIOGRAM;  Surgeon: Sueanne Margarita, MD;  Location: [place] CATH LAB;  Service: Cardiovascular;  Laterality: N/A;  . LEFT HEART CATHETERIZATION WITH CORONARY/GRAFT ANGIOGRAM N/A 08/08/2013   Procedure: LEFT HEART CATHETERIZATION WITH Beatrix Fetters;  Surgeon: Sinclair Grooms, MD;  Location: [facility] C[facility] LAB;  Service: Cardiovascular;  Laterality: N/A;  . LUMBAR [place] SURGERY  2003  . RIGHT HEART CATHETERIZATION N/A 02/17/2014   Procedure: RIGHT HEART CATH;  Surgeon: Larey Dresser, MD;  Location: [facility] CATH LAB;  Service: Cardiovascular;  Laterality: N/A;  . VENOGRAM N/A 06/08/2011   Procedure: VENOGRAM;  Surgeon: Thompson Grayer, MD;  Location: [facility] CATH LAB;  Service: Cardiovascular;  Laterality: N/A;    Current Outpatient Medications  Medication Sig Dispense Refill  . allopurinol (ZYLOPRIM) 100 MG tablet Take 100 mg by mouth 2 (two) times daily.     Marland Kitchen apixaban (ELIQUIS) 5 MG TABS tablet Take 1 tablet (5 mg total) by mouth 2 (two) times daily. 60 tablet 11  . atorvastatin (LIPITOR) 80 MG tablet TAKE 1 TABLET BY MOUTH ONCE DAILY AT  6  PM 90 tablet 0  . carvedilol (COREG) 25 MG tablet TAKE 1/2 (ONE-HALF) TABLET BY MOUTH TWICE DAILY WITH A MEAL (Patient taking differently: Take 12.5 mg by mouth 2 (two) times daily. ) 30 tablet  5  . eplerenone (INSPRA) 50 MG tablet Take 1 tablet (50 mg total) by mouth daily. 30 tablet 6  . furosemide (LASIX) 40 MG tablet Take 40 mg by mouth 2 (two) times daily. Use as directed     . Insulin Glargine (TOUJEO SOLOSTAR) 300 UNIT/ML SOPN Inject 20 Units into the skin daily.     . insulin regular human CONCENTRATED (HUMULIN R U-500 KWIKPEN) 500 UNIT/ML kwikpen 30 units before BFST, 20 at lunch and 35 before supper, take 30-60 minutes before each meal (Patient taking differently: Inject 20-25 Units into the skin See admin instructions. 20 units in the morning, 25 at lunch, 20 at night., take 30-60 minutes before each meal) 2 pen 3  . nitroGLYCERIN (NITROSTAT) 0.4 MG SL tablet DISSOLVE ONE TABLET UNDER THE TONGUE EVERY 5 MINUTES AS NEEDED FOR CHEST PAIN.  DO NOT EXCEED A TOTAL OF 3 DOSES IN 15 MINUTES 25 tablet 3  . Polyethyl Glycol-Propyl Glycol (LUBRICANT EYE DROPS) 0.4-0.3 % SOLN Place 1 drop into both eyes 3 (three) times daily as  needed (for dry/irritated eyes.).    Marland Kitchen QUEtiapine (SEROQUEL) 100 MG tablet Take 100 mg by mouth at bedtime.   3  . ranolazine (RANEXA) 1000 MG SR tablet Take 500 mg by mouth 2 (two) times daily.    Marland Kitchen triamcinolone cream (KENALOG) 0.1 % Apply 1 application topically 2 (two) times daily as needed (for dry/irritated skin.).    Marland Kitchen venlafaxine (EFFEXOR-XR) 150 MG 24 hr capsule Take 150 mg by mouth daily with breakfast.      No current facility-administered medications for this visit.     Allergies:   Codeine and Latex   Social History:  The patient  reports that he quit smoking about 49 years ago. His smoking use included cigarettes. He has a 7.50 pack-year smoking history. He quit smokeless tobacco use about 47 years ago. He reports current alcohol use of about 1.0 standard drinks of alcohol per week. He reports that he does not use drugs.   Family History:  The patient's family history includes Cancer in his brother; Coronary artery disease in his unknown relative;  Heart attack in his brother, father, and mother; Heart failure in his father and mother; Sudden Cardiac Death in his brother.   ROS:  Please see the history of present illness.   All other systems are personally reviewed and negative.    Exam:    Vital Signs:  BP 105/62   Pulse 66   Wt 194 lb (88 kg)   BMI 27.84 kg/m   Well sounding, not tachypneic   Labs/Other Tests and Data Reviewed:    Recent Labs: 09/24/2017: B Natriuretic Peptide 93.0 01/06/2018: ALT 19; Magnesium 1.8 01/28/2018: Hemoglobin 13.4; Platelets 261 05/03/2018: BUN 27; Creatinine, Ser 1.65; Potassium 4.0; Sodium 140   Wt Readings from Last 3 Encounters:  07/15/18 194 lb (88 kg)  05/15/18 200 lb 3.2 oz (90.8 kg)  04/16/18 201 lb 6.4 oz (91.4 kg)     Other studies personally reviewed: Additional studies/ records that were reviewed today include: my prior office note, cath 08/03/15  Review of the above records today demonstrates: as above   Last device remote is reviewed from [place] PDF dated 07/10/2018 which reveals normal device function, presenting rhythm was afib   ASSESSMENT & PLAN:    1.  Persistent afib His afib appears to have progressed to persistent Though I would advise cardioversion, his symptoms are low.  I would therefore advise that we wait until after COVID 19 has passed.  At that time, if still in AF, he will need to come to the AF clinic to discuss cardioversion further. He is on eliquis and tolerating this well.  2. CAD/ ischemic CM/ chronic systolic dysfunction He feels that volume is stable. Norma ICD function by recent remote Stop plavix as we are initiating eliquis  3. HTN Stable No change required today  4. COVID 19 screen The patient denies symptoms of COVID 19 at this time.  The importance of social distancing was discussed today.  5. Chronic renal insufficiency (stage III) Followed by Dr Brigitte Pulse  Follow-up:  AF clinic in 2 months (after COVID 19) Next remote: 09/2018, follows  in [facility]  Current medicines are reviewed at length with the patient today.   The patient does not have concerns regarding his medicines.  The following changes were made today:  none  Labs/ tests ordered today include:  No orders of the defined types were placed in this encounter.  Patient Risk:  after full review of  this patients clinical status, I feel that they are at high risk at this time.  Today, I have spent 22 minutes with the patient with telehealth technology discussing new onset afib .    SignedThompson Grayer, MD  07/15/2018 2:41 PM     [place] [address] [phone] (office) [phone] (fax)

## 2018-07-22 ENCOUNTER — Ambulatory Visit (INDEPENDENT_AMBULATORY_CARE_PROVIDER_SITE_OTHER): Payer: Medicare HMO

## 2018-07-22 ENCOUNTER — Other Ambulatory Visit: Payer: Self-pay

## 2018-07-22 DIAGNOSIS — I5022 Chronic systolic (congestive) heart failure: Secondary | ICD-10-CM | POA: Diagnosis not present

## 2018-07-22 DIAGNOSIS — Z95810 Presence of automatic (implantable) cardiac defibrillator: Secondary | ICD-10-CM | POA: Diagnosis not present

## 2018-07-23 ENCOUNTER — Telehealth: Payer: Self-pay

## 2018-07-23 NOTE — Telephone Encounter (Signed)
Remote ICM transmission received.  Attempted call to patient regarding ICM remote transmission and left detailed message, per DPR, with next ICM remote transmission date of 08/26/2018.  Advised to return call for any fluid symptoms or questions.

## 2018-07-23 NOTE — Progress Notes (Signed)
EPIC Encounter for ICM Monitoring  Patient Name: [name] is a 78 y.o. male Date: 07/23/2018 Primary Care Physican: Mayra Neer, MD Primary Cardiologist:[place] Electrophysiologist: Allred Bi-V Pacing: 96.4% Baselineweight: 196 lbs LastWeight:200lbs(05/15/2018 office visit)  Observations (2) (10-Jul-2018 to 22-Jul-2018)  Alert: AT/AF >= 6 hr for 6 days.  Patient Activity less than 1 hr/day for 1 weeks  Clinical Status (10-Jul-2018 to 22-Jul-2018)  AF       3 episodes  Time in AT/AF 10.3 hr/day (43.1%)  Longest AT/AF 5 days  Attempted call to patient and unable to reach.  Left detailed message per DPR regarding transmission. Transmission reviewed.   Report: Thoracic impedance normal.  Prescribed:Furosemide40 mg1tablet(40 mg total) by mouthdaily.  Labs: 05/03/2018 Creatinine 1.65, BUN 27, Potassium 4.0, Sodium 140, GFR 40-46 04/26/2018 Creatinine 1.91, BUN 39, Potassium 3.9, Sodium 135, GFR 34-39  04/16/2018 Creatinine 1.60, BUN 30, Potassium 3.9, Sodium 139, GFR 42-48 01/28/2018 Creatinine 1.44, BUN 35, Potassium 4.3, Sodium 138, GFR 47-55 01/06/2018 Creatinine 1.60, BUN 30, Potassium 4.4, Sodium 140, GFR 41-47 10/04/2017 Creatinine 1.70, BUN 40, Potassium 3.9, Sodium 138, GFR 38-44 09/25/2017 Creatinine1.50, BUN40, Potassium4.0, Sodium139, GFR44-51 09/24/2017 Creatinine2.15, BUN49, Potassium4.2,Sodium 135, GFR 28-33 @ 1:57AM  09/24/2017 Creatinine1.00, BUN41, Potassium4.0, Sodium133 @ 1:56 AM  09/11/2017 Creatinine1.64, BUN33, Potassium4.0, Sodium135, [id number]  Recommendations: No changes and encouraged to call for fluid symptoms.  Follow-up plan: ICM clinic phone appointment on5/02/2019  Copy of ICM check sent to Dr.Allred.  AT/AF   3 month ICM trend: 07/22/2018    1 Year ICM trend:       [name], RN 07/23/2018 1:32 PM

## 2018-08-12 ENCOUNTER — Other Ambulatory Visit (HOSPITAL_COMMUNITY): Payer: Self-pay | Admitting: Cardiology

## 2018-08-13 ENCOUNTER — Other Ambulatory Visit (HOSPITAL_COMMUNITY): Payer: Self-pay | Admitting: Cardiology

## 2018-08-14 ENCOUNTER — Ambulatory Visit (INDEPENDENT_AMBULATORY_CARE_PROVIDER_SITE_OTHER): Payer: Medicare HMO | Admitting: *Deleted

## 2018-08-14 ENCOUNTER — Other Ambulatory Visit: Payer: Self-pay

## 2018-08-14 DIAGNOSIS — I5022 Chronic systolic (congestive) heart failure: Secondary | ICD-10-CM | POA: Diagnosis not present

## 2018-08-14 DIAGNOSIS — I4819 Other persistent atrial fibrillation: Secondary | ICD-10-CM

## 2018-08-14 LAB — CUP PACEART REMOTE DEVICE CHECK
Battery Remaining Longevity: 94 mo
Battery Voltage: 3 V
Brady Statistic AP VP Percent: 2.36 %
Brady Statistic AP VS Percent: 0.01 %
Brady Statistic AS VP Percent: 97.07 %
Brady Statistic AS VS Percent: 0.56 %
Brady Statistic RA Percent Paced: 2.33 %
Brady Statistic RV Percent Paced: 96.78 %
Date Time Interrogation Session: 20200429084223
HighPow Impedance: 41 Ohm
HighPow Impedance: 53 Ohm
Implantable Lead Implant Date: 20111003
Implantable Lead Implant Date: 20111003
Implantable Lead Implant Date: 20130509
Implantable Lead Location: 753858
Implantable Lead Location: 753859
Implantable Lead Location: 753860
Implantable Lead Model: 4196
Implantable Lead Model: 5076
Implantable Lead Model: 6947
Implantable Pulse Generator Implant Date: 20191030
Lead Channel Impedance Value: 342 Ohm
Lead Channel Impedance Value: 380 Ohm
Lead Channel Impedance Value: 399 Ohm
Lead Channel Impedance Value: 437 Ohm
Lead Channel Impedance Value: 513 Ohm
Lead Channel Impedance Value: 646 Ohm
Lead Channel Pacing Threshold Amplitude: 0.625 V
Lead Channel Pacing Threshold Amplitude: 1 V
Lead Channel Pacing Threshold Amplitude: 1.75 V
Lead Channel Pacing Threshold Pulse Width: 0.4 ms
Lead Channel Pacing Threshold Pulse Width: 0.4 ms
Lead Channel Pacing Threshold Pulse Width: 0.4 ms
Lead Channel Sensing Intrinsic Amplitude: 1.5 mV
Lead Channel Sensing Intrinsic Amplitude: 1.5 mV
Lead Channel Sensing Intrinsic Amplitude: 31.625 mV
Lead Channel Sensing Intrinsic Amplitude: 31.625 mV
Lead Channel Setting Pacing Amplitude: 1.5 V
Lead Channel Setting Pacing Amplitude: 2 V
Lead Channel Setting Pacing Amplitude: 3.5 V
Lead Channel Setting Pacing Pulse Width: 0.4 ms
Lead Channel Setting Pacing Pulse Width: 0.4 ms
Lead Channel Setting Sensing Sensitivity: 0.3 mV

## 2018-08-20 ENCOUNTER — Other Ambulatory Visit (HOSPITAL_COMMUNITY): Payer: Self-pay | Admitting: Cardiology

## 2018-08-23 NOTE — Progress Notes (Signed)
Remote ICD transmission.   

## 2018-08-26 ENCOUNTER — Other Ambulatory Visit: Payer: Self-pay

## 2018-08-26 ENCOUNTER — Ambulatory Visit (INDEPENDENT_AMBULATORY_CARE_PROVIDER_SITE_OTHER): Payer: Medicare HMO

## 2018-08-26 DIAGNOSIS — Z95810 Presence of automatic (implantable) cardiac defibrillator: Secondary | ICD-10-CM

## 2018-08-26 DIAGNOSIS — I5022 Chronic systolic (congestive) heart failure: Secondary | ICD-10-CM

## 2018-08-28 ENCOUNTER — Telehealth: Payer: Self-pay

## 2018-08-28 NOTE — Telephone Encounter (Signed)
Remote ICM transmission received.  Attempted call to patient regarding ICM remote transmission and left detailed message, per DPR, with next ICM remote transmission date of 09/30/2018.  Advised to return call for any fluid symptoms or questions.   

## 2018-08-28 NOTE — Progress Notes (Signed)
EPIC Encounter for ICM Monitoring  Patient Name: [name] is a 78 y.o. male Date: 08/28/2018 Primary Care Physican: Mayra Neer, MD Primary Cardiologist:[place] Electrophysiologist: Allred Bi-V Pacing: 96.1% Baselineweight: 196 lbs LastWeight:200lbs(1/29/2020office visit)   Attempted call to patient and unable to reach.  Left detailed message per DPR regarding transmission. Transmission reviewed.   OptiVol Thoracic impedance normal.  Prescribed:Furosemide40 mg1tablet(40 mg total) by mouthdaily.  Labs: 05/03/2018 Creatinine1.65Glory Buff, Potassium4.0, [id number], [id number] 04/26/2018 Creatinine1.91, BUN39, Potassium3.9, [id number], [id number] 04/16/2018 Creatinine 1.60, BUN 30, Potassium 3.9, Sodium 139, GFR 42-48 01/28/2018 Creatinine 1.44, BUN 35, Potassium 4.3, Sodium 138, GFR 47-55 01/06/2018 Creatinine 1.60, BUN 30, Potassium 4.4, Sodium 140, GFR 41-47 10/04/2017 Creatinine 1.70, BUN 40, Potassium 3.9, Sodium 138, GFR 38-44 09/25/2017 Creatinine1.50, BUN40, Potassium4.0, Sodium139, GFR44-51 09/24/2017 Creatinine2.15, BUN49, Potassium4.2,Sodium 135, GFR 28-33 @ 1:57AM  09/24/2017 Creatinine1.00, BUN41, Potassium4.0, Sodium133 @ 1:56 AM  09/11/2017 Creatinine1.64, BUN33, Potassium4.0, Sodium135, [id number]  Recommendations:Left voice mail with ICM number and encouraged to call if experiencing any fluid symptoms.  Follow-up plan: ICM clinic phone appointment on6/15/2020.  Office visit with Dr Aundra Dubin 09/24/2018.  Copy of ICM check sent to Dr.Allred.  3 month ICM trend: 08/26/2018    1 Year ICM trend:       [name], RN 08/28/2018 11:54 AM

## 2018-09-24 ENCOUNTER — Other Ambulatory Visit: Payer: Self-pay

## 2018-09-24 ENCOUNTER — Ambulatory Visit (HOSPITAL_BASED_OUTPATIENT_CLINIC_OR_DEPARTMENT_OTHER)
Admission: RE | Admit: 2018-09-24 | Discharge: 2018-09-24 | Disposition: A | Discharge status: Home / Self Care | Payer: Medicare HMO | Source: Ambulatory Visit | Attending: Cardiology | Admitting: Cardiology

## 2018-09-24 ENCOUNTER — Encounter (HOSPITAL_COMMUNITY): Payer: Self-pay | Admitting: Cardiology

## 2018-09-24 ENCOUNTER — Ambulatory Visit (HOSPITAL_COMMUNITY)
Admission: RE | Admit: 2018-09-24 | Discharge: 2018-09-24 | Disposition: A | Discharge status: Home / Self Care | Payer: Medicare HMO | Source: Ambulatory Visit | Attending: Cardiology | Admitting: Cardiology

## 2018-09-24 VITALS — BP 112/68 | HR 68 | Wt 189.8 lb

## 2018-09-24 DIAGNOSIS — I480 Paroxysmal atrial fibrillation: Secondary | ICD-10-CM | POA: Diagnosis not present

## 2018-09-24 DIAGNOSIS — E785 Hyperlipidemia, unspecified: Secondary | ICD-10-CM | POA: Insufficient documentation

## 2018-09-24 DIAGNOSIS — Z794 Long term (current) use of insulin: Secondary | ICD-10-CM | POA: Diagnosis not present

## 2018-09-24 DIAGNOSIS — Z7901 Long term (current) use of anticoagulants: Secondary | ICD-10-CM | POA: Insufficient documentation

## 2018-09-24 DIAGNOSIS — Z951 Presence of aortocoronary bypass graft: Secondary | ICD-10-CM | POA: Insufficient documentation

## 2018-09-24 DIAGNOSIS — I5022 Chronic systolic (congestive) heart failure: Secondary | ICD-10-CM | POA: Diagnosis not present

## 2018-09-24 DIAGNOSIS — E1122 Type 2 diabetes mellitus with diabetic chronic kidney disease: Secondary | ICD-10-CM | POA: Insufficient documentation

## 2018-09-24 DIAGNOSIS — F329 Major depressive disorder, single episode, unspecified: Secondary | ICD-10-CM | POA: Insufficient documentation

## 2018-09-24 DIAGNOSIS — I5042 Chronic combined systolic (congestive) and diastolic (congestive) heart failure: Secondary | ICD-10-CM | POA: Diagnosis not present

## 2018-09-24 DIAGNOSIS — R9431 Abnormal electrocardiogram [ECG] [EKG]: Secondary | ICD-10-CM | POA: Insufficient documentation

## 2018-09-24 DIAGNOSIS — N183 Chronic kidney disease, stage 3 (moderate): Secondary | ICD-10-CM | POA: Insufficient documentation

## 2018-09-24 DIAGNOSIS — Z950 Presence of cardiac pacemaker: Secondary | ICD-10-CM | POA: Diagnosis not present

## 2018-09-24 DIAGNOSIS — Z7902 Long term (current) use of antithrombotics/antiplatelets: Secondary | ICD-10-CM | POA: Diagnosis not present

## 2018-09-24 DIAGNOSIS — Z8249 Family history of ischemic heart disease and other diseases of the circulatory system: Secondary | ICD-10-CM | POA: Insufficient documentation

## 2018-09-24 DIAGNOSIS — I25810 Atherosclerosis of coronary artery bypass graft(s) without angina pectoris: Secondary | ICD-10-CM | POA: Diagnosis not present

## 2018-09-24 DIAGNOSIS — Z87891 Personal history of nicotine dependence: Secondary | ICD-10-CM | POA: Diagnosis not present

## 2018-09-24 DIAGNOSIS — I340 Nonrheumatic mitral (valve) insufficiency: Secondary | ICD-10-CM | POA: Insufficient documentation

## 2018-09-24 DIAGNOSIS — I255 Ischemic cardiomyopathy: Secondary | ICD-10-CM | POA: Diagnosis not present

## 2018-09-24 DIAGNOSIS — M109 Gout, unspecified: Secondary | ICD-10-CM | POA: Diagnosis not present

## 2018-09-24 DIAGNOSIS — Z79899 Other long term (current) drug therapy: Secondary | ICD-10-CM | POA: Insufficient documentation

## 2018-09-24 DIAGNOSIS — I130 Hypertensive heart and chronic kidney disease with heart failure and stage 1 through stage 4 chronic kidney disease, or unspecified chronic kidney disease: Secondary | ICD-10-CM | POA: Diagnosis not present

## 2018-09-24 DIAGNOSIS — I252 Old myocardial infarction: Secondary | ICD-10-CM | POA: Insufficient documentation

## 2018-09-24 DIAGNOSIS — Z9049 Acquired absence of other specified parts of digestive tract: Secondary | ICD-10-CM | POA: Diagnosis not present

## 2018-09-24 LAB — CBC
HCT: 42.1 % (ref 39.0–52.0)
Hemoglobin: 13.3 g/dL (ref 13.0–17.0)
MCH: 30 pg (ref 26.0–34.0)
MCHC: 31.6 g/dL (ref 30.0–36.0)
MCV: 94.8 fL (ref 80.0–100.0)
Platelets: 283 10*3/uL (ref 150–400)
RBC: 4.44 MIL/uL (ref 4.22–5.81)
RDW: 15.1 % (ref 11.5–15.5)
WBC: 9.4 10*3/uL (ref 4.0–10.5)
nRBC: 0 % (ref 0.0–0.2)

## 2018-09-24 LAB — LIPID PANEL
Cholesterol: 122 mg/dL (ref 0–200)
HDL: 31 mg/dL — ABNORMAL LOW (ref >40)
LDL Cholesterol: 58 mg/dL (ref 0–99)
Total CHOL/HDL Ratio: 3.9 RATIO
Triglycerides: 165 mg/dL — ABNORMAL HIGH (ref <150)
VLDL: 33 mg/dL (ref 0–40)

## 2018-09-24 LAB — BASIC METABOLIC PANEL
Anion gap: 12 (ref 5–15)
BUN: 23 mg/dL (ref 8–23)
CO2: 25 mmol/L (ref 22–32)
Calcium: 8.9 mg/dL (ref 8.9–10.3)
Chloride: 103 mmol/L (ref 98–111)
Creatinine, Ser: 1.47 mg/dL — ABNORMAL HIGH (ref 0.61–1.24)
GFR calc Af Amer: 53 mL/min — ABNORMAL LOW (ref >60)
GFR calc non Af Amer: 46 mL/min — ABNORMAL LOW (ref >60)
Glucose, Bld: 141 mg/dL — ABNORMAL HIGH (ref 70–99)
Potassium: 4 mmol/L (ref 3.5–5.1)
Sodium: 140 mmol/L (ref 135–145)

## 2018-09-24 MED ORDER — FUROSEMIDE 40 MG PO TABS: 60.0000 mg | ORAL_TABLET | Freq: Two times a day (BID) | ORAL | 3 refills | Status: DC

## 2018-09-24 MED ORDER — LOSARTAN POTASSIUM 25 MG PO TABS: 12.5000 mg | ORAL_TABLET | Freq: Every day | ORAL | 3 refills | Status: DC

## 2018-09-24 MED ORDER — FUROSEMIDE 40 MG PO TABS: ORAL_TABLET | ORAL | 3 refills | Status: DC

## 2018-09-24 NOTE — Progress Notes (Signed)
  Echocardiogram 2D Echocardiogram has been performed.  [name] 09/24/2018, 8:43 AM

## 2018-09-24 NOTE — Addendum Note (Signed)
Encounter addended by: Larey Dresser, MD on: 09/24/2018 11:19 PM  Actions taken: Clinical Note Signed, Visit diagnoses modified, LOS modified

## 2018-09-24 NOTE — Addendum Note (Signed)
Encounter addended by: Larey Dresser, MD on: 09/24/2018 11:48 PM  Actions taken: Clinical Note Signed

## 2018-09-24 NOTE — Progress Notes (Addendum)
Advanced Heart Failure Clinic Note   Patient ID: [name], male   DOB: Oct 12, 1940, 78 y.o.   MRN: [id number] PCP: Dr. Lynnda Child Cardiology: Dr Aundra Dubin  19 y.o. with history of CAD s/p CABG and redo CABG as well as ischemic cardiomyopathy with CRT-D device presents for followup of CHF and CAD.  He had a Cardiolite in the 4/15 showing lateral wall ischemia.  LHC at that time showed all his vein grafts from both CABG surgeries occluded.  The LIMA-LAD was patent and there was a 90% distal LM stenosis.  The lateral ischemia likely corresponded to LCx territory downstream from the LM stenosis.  PCI of the distal LM was thought to be high risk and characteristics of the lesion were not favorable for PCI.  Last echo showed EF 25-30% with moderate RV systolic dysfunction.  He had RHC in 11/15 with normal filling pressures and relatively preserved cardiac index (2.55).    In 4/17, he was admitted with chest pain concerning for unstable angina.  Angiography showed 85% distal LM with 90% ostial LCx stenosis.  LIMA was patent but proximal LAD, proximal RCA, and all SVGs were totally occluded. High had successful DES to LM and PTCA to ostial LCx with Impella support.  [name] was stopped and he was put back on low dose lisinopril due to symptomatic hypotension.  He had Cardiolite in 11/17 with scar, no ischemia.   He was admitted in 1/19 with AKI, creatinine up to 3. Meds held then restarted.   Echo 3/19 with EF 30-35%.   He was admitted with syncope and AKI in 6/19.  He was thought to be dehydrated and orthostatic. Losartan stopped but eventually restarted.   He was noted to have prolonged atrial fibrillation by device check in 3/20, now on Eliquis.   Echo was done today and reviewed, showing EF 25-30% with inferior/inferolateral AK, moderate LV dilation, mild LVH, mild MR, severe LAE.   He returns for followup of CHF and CAD.  Weight has been trending down but appetite is poor.  PCP stopped losartan due  to rise in creatinine.  He has become more symptomatic over the last few weeks.  Still mows the grass but very short of breath walking out to the mailbox and doing other moderate activity that in the past did not bother him.  His wife says that he has become "extra slow."  No orthopnea/PND. Rare atypical chest pain.   REDS clip: 30%  Medtronic device interrogation: AF in 3/20 but none since then, Optivol showed fluid index > threshold with decreased impedance.   Labs (9/15): LDL particle number 816, LDL 57, LFTs normal Labs (10/15): K 4.4, creatinine 1.4 Labs (11/15): K 4.3, creatinine 1.36 Labs (12/15): K 4.8, creatinine 1.34 Labs (3/16): K 4, creatinine 1.38, LDL 46, HDl 35 Labs (7/16): K 4, creatinine 1.39, BNP 211 Labs (03/03/15): K 4.1, creatinine 1.47, BNP 227, LDL 80, HDL 32 Labs (12/16): K 4.6, creatinine 1.12, LDL 68, HDL 34 Labs (4/17): K 3.4, creatinine 1.15 Labs (6/17): K 3.8, creatinine 1.67 Labs (7/17): K 4, creatinine 1.35 Labs (10/17): K 3.9, creatinine 1.31, LDL 45, HDL 30 Labs (2/18): K 4, creatinine 1.43 Labs (5/18): LDL 44, HDL 27 Labs (6/18): K 4.1, creatinine 1.49 Labs (12/18): K 4.4, creatinine 1.44 Labs (1/19): K 4.3, creatinine 1.48 Labs (2/19): K 3.7, creatinine 1.6 Labs (6/19): K 4, creatinine 1.5 Labs (9/19): K 4.4, creatinine 1.6, hgb 13.4 Labs (10/19): K 4.3, creatinine 1.44 Labs (1/20): K  4, creatinine 1.65  PMH: 1. Gout 2. Hyperlipidemia 3. CAD: CABG 1997 and redo 11/12.   - LHC (4/15) with totally occluded LAD, totally occluded RCA, 80% distal LM, 50% mLCx, 2 SVG-RCA grafts totally occluded, 2 SVG-OM grafts totally occluded, patent LIMA-LAD.  Cardiolite prior to 4/15 cath showed lateral wall ischemia (LCx territory).  PCI to distal LM would be a high risk procedure.   - Cardiolite (8/16) with EF 20%, severe scar in RCA and probably LCx territory, minimal peri-infarct ischemia.   - Cardiolite 02/25/15 with primarily scar from prior MI, minimal  ischemia.  - Unstable angina 4/17: 85% distal LM with 90% ostial LCx stenosis.  LIMA was patent but proximal LAD, proximal RCA, and all SVGs were totally occluded. He had successful DES to LM and PTCA to ostial LCx with Impella support  - Cardiolite (11/17): EF 20%, prior MI, no ischemia.  4. Ischemic Cardiomyopathy: Medtronic CRT-D device.   - Echo (6/15) with EF 25-30%, moderate LV dilation, inferior and inferolateral akinesis, moderately decreased RV systolic function, mild MR.   - RHC (11/15) with mean RA 5, PA 23/6, mean PCWP 9, CI 2.55.  - Echo (4/17): EF 25-30% with mild MR.  - Echo (11/17): EF 25-30%, moderately dilated LV, grade II diastolic dysfunction, mild-moderate MR, mildly decreased RV systolic function.  - Echo (3/19): EF 30-35%, inferior/inferolateral akinesis, normal RV size with mildly decreased systolic function.  - Echo (6/20): EF 25-30% with inferior/inferolateral AK, moderate LV dilation, mild LVH, mild MR, severe LAE. 5. H/o cholecystectomy 6. OA 7. Depression 8. Type II diabetes 9. GERD 10. CKD stage 3 11. ?Early dementia 12. Atrial fibrillation: Paroxysmal.   SH: Married, prior smoker (many years ago), lives in [place].    FH: CAD  ROS: All systems reviewed and negative except as per HPI.   Current Outpatient Medications  Medication Sig Dispense Refill  . allopurinol (ZYLOPRIM) 100 MG tablet Take 100 mg by mouth 2 (two) times daily.     Marland Kitchen apixaban (ELIQUIS) 5 MG TABS tablet Take 1 tablet (5 mg total) by mouth 2 (two) times daily. 60 tablet 11  . atorvastatin (LIPITOR) 80 MG tablet TAKE 1 TABLET BY MOUTH ONCE DAILY AT 6PM 90 tablet 0  . carvedilol (COREG) 25 MG tablet TAKE 1/2 (ONE-HALF) TABLET BY MOUTH TWICE DAILY WITH A MEAL 90 tablet 0  . eplerenone (INSPRA) 50 MG tablet Take 1 tablet (50 mg total) by mouth daily. 30 tablet 6  . furosemide (LASIX) 40 MG tablet Take 1.5 tablets (60 mg total) by mouth 2 (two) times daily. Use as directed 270 tablet 3  .  Insulin Glargine (BASAGLAR KWIKPEN) 100 UNIT/ML SOPN Inject into the skin daily. 30 before breakfast    . insulin regular human CONCENTRATED (HUMULIN R U-500 KWIKPEN) 500 UNIT/ML kwikpen 30 units before BFST, 20 at lunch and 35 before supper, take 30-60 minutes before each meal (Patient taking differently: Inject 20-25 Units into the skin See admin instructions. 20 units in the morning, 25 at lunch, 20 at night., take 30-60 minutes before each meal) 2 pen 3  . nitroGLYCERIN (NITROSTAT) 0.4 MG SL tablet DISSOLVE ONE TABLET UNDER THE TONGUE EVERY 5 MINUTES AS NEEDED FOR CHEST PAIN.  DO NOT EXCEED A TOTAL OF 3 DOSES IN 15 MINUTES 25 tablet 0  . Polyethyl Glycol-Propyl Glycol (LUBRICANT EYE DROPS) 0.4-0.3 % SOLN Place 1 drop into both eyes 3 (three) times daily as needed (for dry/irritated eyes.).    Marland Kitchen QUEtiapine (SEROQUEL)  100 MG tablet Take 100 mg by mouth at bedtime.   3  . ranolazine (RANEXA) 1000 MG SR tablet Take 500 mg by mouth 2 (two) times daily.    Marland Kitchen triamcinolone cream (KENALOG) 0.1 % Apply 1 application topically 2 (two) times daily as needed (for dry/irritated skin.).    Marland Kitchen venlafaxine (EFFEXOR-XR) 150 MG 24 hr capsule Take 150 mg by mouth daily with breakfast.     . losartan (COZAAR) 25 MG tablet Take 0.5 tablets (12.5 mg total) by mouth daily. 45 tablet 3   No current facility-administered medications for this encounter.    BP 112/68   Pulse 68   Wt 86.1 kg (189 lb 12.8 oz)   SpO2 96%   BMI 27.23 kg/m    Wt Readings from Last 3 Encounters:  09/24/18 86.1 kg (189 lb 12.8 oz)  07/15/18 88 kg (194 lb)  05/15/18 90.8 kg (200 lb 3.2 oz)    General: NAD Neck: JVP 8-9 cm with HJR, no thyromegaly or thyroid nodule.  Lungs: Clear to auscultation bilaterally with normal respiratory effort. CV: Nondisplaced PMI.  Heart regular S1/S2, no S3/S4, no murmur.  No peripheral edema.  No carotid bruit.  Normal pedal pulses.  Abdomen: Soft, nontender, no hepatosplenomegaly, no distention.  Skin:  Intact without lesions or rashes.  Neurologic: Alert and oriented x 3.  Psych: Normal affect. Extremities: No clubbing or cyanosis.  HEENT: Normal.    Assessment/Plan: 1. Chronic systolic CHF: Ischemic cardiomyopathy.  Echo was done today and reviewed, EF 25-30%. He has a Medtronic CRT-D device.  Worsening symptoms, NYHA class III.  Though REDS clip does not suggest significant volume overload, he does appear to have some volume overload on exam and Optivol suggests volume overload.  - Increase Lasix to 60 mg bid.  BMET today and again in 10 days.    - Continue eplerenone 50 mg daily.   - BP did not tolerate Entresto.  Had ACEI cough. Restart losartan at 12.5 mg daily.  - Continue Coreg 12.5 mg bid. 2. CAD: s/p CABG and redo. Had DES to distal LM, angioplasty to ostial LCx in 4/17. Cardiolite 11/17 with scar, no ischemia.  No exertional chest pain, stable atypical chest pain pattern.    - Continue statin.  - He is off ASA/Plavix and taking apixaban 5 mg bid.  - Continue Coreg and ranolazine 500 mg bid.  3. CKD stage III: BMET today.   4. Hyperlipidemia: Continue statin.  Check lipids today.    5. Atrial fibrillation: Paroxysmal.  NSR today.  Had several days of atrial fibrillation in 3/20 by device check.  - Continue apixaban.  Check CBC.   Followup in 6 wks.    [name] 09/24/2018

## 2018-09-24 NOTE — Progress Notes (Signed)
ReDS Vest - 09/24/18 0952      ReDS Vest   Estimated volume prior to reading  Low    Fitting Posture  Sitting    Height Marker  Tall    Ruler Value  29    Center Strip  Aligned    ReDS Value  30

## 2018-09-24 NOTE — Patient Instructions (Addendum)
START Losartan 12.5mg  (1/2 tab) daily  INCREASE Furosemide to 60mg  (1.5 tabs_) twice a day  Labs today We will only contact you if something comes back abnormal or we need to make some changes. Otherwise no news is good news!  REPEAT labs in 10 days. June 18, 9:30a  Your physician recommends that you schedule a follow-up appointment in: 6 weeks with Dr Aundra Dubin July 27, 10am

## 2018-09-30 ENCOUNTER — Ambulatory Visit (INDEPENDENT_AMBULATORY_CARE_PROVIDER_SITE_OTHER): Payer: Medicare HMO

## 2018-09-30 DIAGNOSIS — I5022 Chronic systolic (congestive) heart failure: Secondary | ICD-10-CM

## 2018-09-30 DIAGNOSIS — Z95810 Presence of automatic (implantable) cardiac defibrillator: Secondary | ICD-10-CM

## 2018-09-30 NOTE — Progress Notes (Signed)
EPIC Encounter for ICM Monitoring  Patient Name: [name] is a 78 y.o. male Date: 09/30/2018 Primary Care Physican: Mayra Neer, MD Primary Cardiologist:[facility] Electrophysiologist: Allred Bi-V Pacing: 94.9% 09/26/2018 Weight: 188 lbs 6/15/2020Weight:190lbs   Spoke with wife.  Patient drinks a lot of water and recently added Gatorade to his intake.  Discussed limiting fluid intake to 64 oz daily and to avoid Gatorade because it contains salt and unless does not need the electrolytes unless he has severe vomiting and/or diarrhea.  Advised to read food labels for salt content and to limit to 2000 mg daily.    OptiVol Thoracic impedance suggest ongoing possible fluid accumulation starting 08/27/2018. Fluid index>threshold since 5/27.  No improvement since OV with Dr Aundra Dubin 6/9 when Furosemide was increased.   Prescribed:Furosemide40 mg1.5tablets(60 mg total) by mouthbid. (Furosemide was increased on 6/9 by Dr Aundra Dubin)  Labs: 05/03/2018 Creatinine1.65, BUN27, Potassium4.0, [id number], [id number] 04/26/2018 Creatinine1.91, BUN39, Potassium3.9, [id number], [id number] 04/16/2018 Creatinine 1.60, BUN 30, Potassium 3.9, Sodium 139, GFR 42-48 01/28/2018 Creatinine 1.44, BUN 35, Potassium 4.3, Sodium 138, GFR 47-55 01/06/2018 Creatinine 1.60, BUN 30, Potassium 4.4, Sodium 140, GFR 41-47 10/04/2017 Creatinine 1.70, BUN 40, Potassium 3.9, Sodium 138, GFR 38-44 09/25/2017 Creatinine1.50, BUN40, Potassium4.0, Sodium139, GFR44-51 09/24/2017 Creatinine2.15, BUN49, Potassium4.2,Sodium 135, GFR 28-33 @ 1:57AM  09/24/2017 Creatinine1.00, BUN41, Potassium4.0, Sodium133 @ 1:56 AM  09/11/2017 Creatinine1.64, BUN33, Potassium4.0, Sodium135, [id number]  Recommendations:Left voice mail with ICM number and encouraged to call if experiencing any fluid symptoms.  Follow-up plan: ICM clinic phone appointment on6/22/2020 (manual send) to recheck fluid levels.   Copy of ICM check sent to Dr.Allred, Dr Aundra Dubin and Kevan Rosebush, RN.  3 month ICM trend: 09/30/2018    1 Year ICM trend:       [name], RN 09/30/2018 2:10 PM

## 2018-10-03 ENCOUNTER — Ambulatory Visit (HOSPITAL_COMMUNITY)
Admission: RE | Admit: 2018-10-03 | Discharge: 2018-10-03 | Disposition: A | Discharge status: Home / Self Care | Payer: Medicare HMO | Source: Ambulatory Visit | Attending: Internal Medicine | Admitting: Internal Medicine

## 2018-10-03 ENCOUNTER — Other Ambulatory Visit: Payer: Self-pay

## 2018-10-03 DIAGNOSIS — I5022 Chronic systolic (congestive) heart failure: Secondary | ICD-10-CM | POA: Insufficient documentation

## 2018-10-03 LAB — BASIC METABOLIC PANEL
Anion gap: 11 (ref 5–15)
BUN: 28 mg/dL — ABNORMAL HIGH (ref 8–23)
CO2: 27 mmol/L (ref 22–32)
Calcium: 9.2 mg/dL (ref 8.9–10.3)
Chloride: 100 mmol/L (ref 98–111)
Creatinine, Ser: 1.58 mg/dL — ABNORMAL HIGH (ref 0.61–1.24)
GFR calc Af Amer: 49 mL/min — ABNORMAL LOW (ref >60)
GFR calc non Af Amer: 42 mL/min — ABNORMAL LOW (ref >60)
Glucose, Bld: 173 mg/dL — ABNORMAL HIGH (ref 70–99)
Potassium: 3.9 mmol/L (ref 3.5–5.1)
Sodium: 138 mmol/L (ref 135–145)

## 2018-10-04 ENCOUNTER — Ambulatory Visit (INDEPENDENT_AMBULATORY_CARE_PROVIDER_SITE_OTHER): Payer: Medicare HMO

## 2018-10-04 DIAGNOSIS — I5022 Chronic systolic (congestive) heart failure: Secondary | ICD-10-CM

## 2018-10-04 DIAGNOSIS — Z95810 Presence of automatic (implantable) cardiac defibrillator: Secondary | ICD-10-CM

## 2018-10-04 NOTE — Progress Notes (Signed)
EPIC Encounter for ICM Monitoring  Patient Name: [name] is a 78 y.o. male Date: 10/04/2018 Primary Care Physican: Mayra Neer, MD Primary Cardiologist:[place] Electrophysiologist: Allred Bi-V Pacing: 93.5% 09/26/2018 Weight: 188 lbs 6/15/2020Weight:190lbs 10/03/2018 Weight: 189 lbs   Spoke with wife.  She said patient is feeling fine.  His lowest weight this month was 185 lbs on 6/5.      OptiVolThoracic impedance suggest ongoing possible fluid accumulation starting 08/27/2018. Fluid index>threshold since 5/27.  No improvement since OV with Dr Aundra Dubin 6/9 when Furosemide was increased.   Prescribed:Furosemide40 mg1.5tablets(60 mg total) by mouthbid. (Furosemide was increased on 6/9 by Dr Aundra Dubin)  Labs: 05/03/2018 Creatinine1.65, BUN27, Potassium4.0, [id number], [id number] 04/26/2018 Creatinine1.91, BUN39, Potassium3.9, [id number], [id number] 04/16/2018 Creatinine 1.60, BUN 30, Potassium 3.9, Sodium 139, GFR 42-48 01/28/2018 Creatinine 1.44, BUN 35, Potassium 4.3, Sodium 138, GFR 47-55 01/06/2018 Creatinine 1.60, BUN 30, Potassium 4.4, Sodium 140, GFR 41-47 10/04/2017 Creatinine 1.70, BUN 40, Potassium 3.9, Sodium 138, GFR 38-44 09/25/2017 Creatinine1.50, BUN40, Potassium4.0, Sodium139, GFR44-51 09/24/2017 Creatinine2.15, BUN49, Potassium4.2,Sodium 135, GFR 28-33 @ 1:57AM  09/24/2017 Creatinine1.00, BUN41, Potassium4.0, Sodium133 @ 1:56 AM  09/11/2017 Creatinine1.64, BUN33, Potassium4.0, Sodium135, [id number]  Recommendations:Advised will send to Dr Aundra Dubin for review (was unable to see 5/15 copied report)  Follow-up plan: ICM clinic phone appointment on6/22/2020 (manual send) to recheck fluid levels.  Copy of ICM check sent to Dr.Allred and Dr Aundra Dubin.  3 month ICM trend: 10/04/2018    1 Year ICM trend:       [name], RN 10/04/2018 12:40 PM

## 2018-10-04 NOTE — Progress Notes (Signed)
Spoke with wife. Requested a remote transmission be sent today for review.  His weight 6/18 was 189 lbs.  She said the lowest weight this month was 185 lbs on 6/5.  She said he is feeling okay.  Will review report that will be sent today.

## 2018-10-05 NOTE — Progress Notes (Signed)
Increase Lasix to 80 mg bid x 3 days then 80 qam/60 qpm.   BMET 7 days.

## 2018-10-07 MED ORDER — FUROSEMIDE 40 MG PO TABS: ORAL_TABLET | ORAL | 3 refills | Status: DC

## 2018-10-07 NOTE — Progress Notes (Signed)
Spoke with wife.  Advised Dr Aundra Dubin recommended to take Furosemide 2 tablets (80 mg total) twice a day x 3 days. After 3rd day take 2 tablets (80 mg total) every AM and 1.5 tablets (60 mg total) every PM.  BMET scheduled 10/15/2018 at HF clinic. Today's weight 188 lbs.  She said he feels fine today.  Advised if condition changes to call back.  She has enough Furosemide on hand for change in dosage and will call pharmacy when refill is needed. Next ICM transmission scheduled 10/11/2018 to recheck fluid levels.

## 2018-10-10 DIAGNOSIS — E7800 Pure hypercholesterolemia, unspecified: Secondary | ICD-10-CM | POA: Diagnosis not present

## 2018-10-10 DIAGNOSIS — N183 Chronic kidney disease, stage 3 (moderate): Secondary | ICD-10-CM | POA: Diagnosis not present

## 2018-10-10 DIAGNOSIS — Z794 Long term (current) use of insulin: Secondary | ICD-10-CM | POA: Diagnosis not present

## 2018-10-10 DIAGNOSIS — E1165 Type 2 diabetes mellitus with hyperglycemia: Secondary | ICD-10-CM | POA: Diagnosis not present

## 2018-10-11 ENCOUNTER — Ambulatory Visit (INDEPENDENT_AMBULATORY_CARE_PROVIDER_SITE_OTHER): Payer: Medicare HMO

## 2018-10-11 DIAGNOSIS — I5022 Chronic systolic (congestive) heart failure: Secondary | ICD-10-CM

## 2018-10-11 DIAGNOSIS — Z95810 Presence of automatic (implantable) cardiac defibrillator: Secondary | ICD-10-CM

## 2018-10-11 DIAGNOSIS — N183 Chronic kidney disease, stage 3 (moderate): Secondary | ICD-10-CM | POA: Diagnosis not present

## 2018-10-11 NOTE — Progress Notes (Signed)
IC Encounter for ICM Monitoring  Patient Name: [name] is a 78 y.o. male Date: 10/11/2018 Primary Care Physican: Mayra Neer, MD Primary Cardiologist:[place] Electrophysiologist: Allred Bi-V Pacing: 94.4% 10/03/2018 Weight: 189 lbs 10/07/2018 Weight: 188 lbs 10/14/2018 Weight: 189 lbs   Spoke with wife. She said patient is feeling fine and right leg has some swelling.     OptiVolThoracic impedanceimproved after completing Furosemide 80 mg bid today but remains suggestive of fluid accumulation.  He will start Furosemide 80 mg AM/60 PM tomorrow, 6/27.  Prescribed:Furosemide40 mgtake 2 tablets (80 mg total) every morning and 1.5tablets(60 mg total) every afternoon.  Labs: BMET Scheduled 6/30 10/03/2018 Creatinine 1.58, BUN 28, Potassium 3.9, Sodium 138, GFR 42-49 09/24/2018 Creatinine 1.47, BUN 23, Potassium 4.0, Sodium 140, GFR 46-53  05/03/2018 Creatinine1.65, BUN27, Potassium4.0, Sodium140, [id number] 04/26/2018 Creatinine1.91, BUN39, Potassium3.9, Sodium135, [id number] A complete set of results can be found in Results Review.  Recommendations:Advised will send to Dr Aundra Dubin for review.  Follow-up plan: ICM clinic phone appointment on7/04/2018 (manual send) to recheck fluid levels.  Copy of ICM check sent to Dr.Allred and Dr Aundra Dubin.   3 month ICM trend: 10/11/2018    1 Year ICM trend:       [name], RN 10/11/2018 5:20 PM

## 2018-10-15 ENCOUNTER — Other Ambulatory Visit (HOSPITAL_COMMUNITY): Payer: Medicare HMO

## 2018-10-15 DIAGNOSIS — I255 Ischemic cardiomyopathy: Secondary | ICD-10-CM | POA: Diagnosis not present

## 2018-10-15 DIAGNOSIS — E1165 Type 2 diabetes mellitus with hyperglycemia: Secondary | ICD-10-CM | POA: Diagnosis not present

## 2018-10-15 DIAGNOSIS — I25119 Atherosclerotic heart disease of native coronary artery with unspecified angina pectoris: Secondary | ICD-10-CM | POA: Diagnosis not present

## 2018-10-15 DIAGNOSIS — N183 Chronic kidney disease, stage 3 (moderate): Secondary | ICD-10-CM | POA: Diagnosis not present

## 2018-10-15 DIAGNOSIS — Z0000 Encounter for general adult medical examination without abnormal findings: Secondary | ICD-10-CM | POA: Diagnosis not present

## 2018-10-15 DIAGNOSIS — I5022 Chronic systolic (congestive) heart failure: Secondary | ICD-10-CM | POA: Diagnosis not present

## 2018-10-15 DIAGNOSIS — I209 Angina pectoris, unspecified: Secondary | ICD-10-CM | POA: Diagnosis not present

## 2018-10-15 DIAGNOSIS — E7800 Pure hypercholesterolemia, unspecified: Secondary | ICD-10-CM | POA: Diagnosis not present

## 2018-10-15 DIAGNOSIS — I130 Hypertensive heart and chronic kidney disease with heart failure and stage 1 through stage 4 chronic kidney disease, or unspecified chronic kidney disease: Secondary | ICD-10-CM | POA: Diagnosis not present

## 2018-10-15 DIAGNOSIS — R69 Illness, unspecified: Secondary | ICD-10-CM | POA: Diagnosis not present

## 2018-10-16 ENCOUNTER — Telehealth: Payer: Self-pay

## 2018-10-16 NOTE — Progress Notes (Signed)
No ICM remote transmission received for 10/16/2018 and next ICM transmission scheduled for 11/12/2018.

## 2018-10-16 NOTE — Telephone Encounter (Signed)
Left message for patient to remind of missed remote transmission.  

## 2018-10-25 ENCOUNTER — Other Ambulatory Visit: Payer: Self-pay | Admitting: Cardiology

## 2018-11-11 ENCOUNTER — Ambulatory Visit (HOSPITAL_COMMUNITY)
Admission: RE | Admit: 2018-11-11 | Discharge: 2018-11-11 | Disposition: A | Discharge status: Home / Self Care | Payer: Medicare HMO | Source: Ambulatory Visit | Attending: Cardiology | Admitting: Cardiology

## 2018-11-11 ENCOUNTER — Encounter (HOSPITAL_COMMUNITY): Payer: Self-pay | Admitting: Cardiology

## 2018-11-11 ENCOUNTER — Other Ambulatory Visit: Payer: Self-pay

## 2018-11-11 VITALS — BP 92/52 | HR 66 | Wt 189.0 lb

## 2018-11-11 DIAGNOSIS — I25810 Atherosclerosis of coronary artery bypass graft(s) without angina pectoris: Secondary | ICD-10-CM | POA: Insufficient documentation

## 2018-11-11 DIAGNOSIS — I252 Old myocardial infarction: Secondary | ICD-10-CM | POA: Diagnosis not present

## 2018-11-11 DIAGNOSIS — Z7901 Long term (current) use of anticoagulants: Secondary | ICD-10-CM | POA: Diagnosis not present

## 2018-11-11 DIAGNOSIS — Z8249 Family history of ischemic heart disease and other diseases of the circulatory system: Secondary | ICD-10-CM | POA: Diagnosis not present

## 2018-11-11 DIAGNOSIS — E1122 Type 2 diabetes mellitus with diabetic chronic kidney disease: Secondary | ICD-10-CM | POA: Insufficient documentation

## 2018-11-11 DIAGNOSIS — I255 Ischemic cardiomyopathy: Secondary | ICD-10-CM | POA: Insufficient documentation

## 2018-11-11 DIAGNOSIS — Z955 Presence of coronary angioplasty implant and graft: Secondary | ICD-10-CM | POA: Insufficient documentation

## 2018-11-11 DIAGNOSIS — Z79899 Other long term (current) drug therapy: Secondary | ICD-10-CM | POA: Diagnosis not present

## 2018-11-11 DIAGNOSIS — M109 Gout, unspecified: Secondary | ICD-10-CM | POA: Diagnosis not present

## 2018-11-11 DIAGNOSIS — I480 Paroxysmal atrial fibrillation: Secondary | ICD-10-CM | POA: Insufficient documentation

## 2018-11-11 DIAGNOSIS — Z9049 Acquired absence of other specified parts of digestive tract: Secondary | ICD-10-CM | POA: Diagnosis not present

## 2018-11-11 DIAGNOSIS — Z951 Presence of aortocoronary bypass graft: Secondary | ICD-10-CM | POA: Insufficient documentation

## 2018-11-11 DIAGNOSIS — Z7902 Long term (current) use of antithrombotics/antiplatelets: Secondary | ICD-10-CM | POA: Insufficient documentation

## 2018-11-11 DIAGNOSIS — I5022 Chronic systolic (congestive) heart failure: Secondary | ICD-10-CM | POA: Diagnosis not present

## 2018-11-11 DIAGNOSIS — Z794 Long term (current) use of insulin: Secondary | ICD-10-CM | POA: Insufficient documentation

## 2018-11-11 DIAGNOSIS — N183 Chronic kidney disease, stage 3 (moderate): Secondary | ICD-10-CM | POA: Diagnosis not present

## 2018-11-11 DIAGNOSIS — E785 Hyperlipidemia, unspecified: Secondary | ICD-10-CM | POA: Diagnosis not present

## 2018-11-11 DIAGNOSIS — Z87891 Personal history of nicotine dependence: Secondary | ICD-10-CM | POA: Insufficient documentation

## 2018-11-11 DIAGNOSIS — R9431 Abnormal electrocardiogram [ECG] [EKG]: Secondary | ICD-10-CM | POA: Diagnosis not present

## 2018-11-11 DIAGNOSIS — F329 Major depressive disorder, single episode, unspecified: Secondary | ICD-10-CM | POA: Insufficient documentation

## 2018-11-11 DIAGNOSIS — Z950 Presence of cardiac pacemaker: Secondary | ICD-10-CM | POA: Diagnosis not present

## 2018-11-11 LAB — BASIC METABOLIC PANEL
Anion gap: 12 (ref 5–15)
BUN: 28 mg/dL — ABNORMAL HIGH (ref 8–23)
CO2: 25 mmol/L (ref 22–32)
Calcium: 8.9 mg/dL (ref 8.9–10.3)
Chloride: 99 mmol/L (ref 98–111)
Creatinine, Ser: 1.52 mg/dL — ABNORMAL HIGH (ref 0.61–1.24)
GFR calc Af Amer: 51 mL/min — ABNORMAL LOW (ref >60)
GFR calc non Af Amer: 44 mL/min — ABNORMAL LOW (ref >60)
Glucose, Bld: 222 mg/dL — ABNORMAL HIGH (ref 70–99)
Potassium: 3.9 mmol/L (ref 3.5–5.1)
Sodium: 136 mmol/L (ref 135–145)

## 2018-11-11 NOTE — Patient Instructions (Addendum)
NO medication changes today.  Labs today We will only contact you if something comes back abnormal or we need to make some changes. Otherwise no news is good news!  Your physician recommends that you schedule a follow-up appointment in: 3 months with Dr Aundra Dubin   At the [facility], you and your health needs are our priority. As part of our continuing mission to provide you with exceptional heart care, we have created designated Provider Care Teams. These Care Teams include your primary Cardiologist (physician) and Advanced Practice Providers (APPs- Physician Assistants and Nurse Practitioners) who all work together to provide you with the care you need, when you need it.   You may see any of the following providers on your designated Care Team at your next follow up: Marland Kitchen Dr Glori Bickers . Dr [name] . Darrick Grinder, NP

## 2018-11-11 NOTE — Progress Notes (Signed)
Advanced Heart Failure Clinic Note   Patient ID: [name], male   DOB: 1940-10-28, 78 y.o.   MRN: [id number] PCP: Dr. Lynnda Child Cardiology: Dr Aundra Dubin  34 y.o. with history of CAD s/p CABG and redo CABG as well as ischemic cardiomyopathy with CRT-D device presents for followup of CHF and CAD.  He had a Cardiolite in the 4/15 showing lateral wall ischemia.  LHC at that time showed all his vein grafts from both CABG surgeries occluded.  The LIMA-LAD was patent and there was a 90% distal LM stenosis.  The lateral ischemia likely corresponded to LCx territory downstream from the LM stenosis.  PCI of the distal LM was thought to be high risk and characteristics of the lesion were not favorable for PCI.  Last echo showed EF 25-30% with moderate RV systolic dysfunction.  He had RHC in 11/15 with normal filling pressures and relatively preserved cardiac index (2.55).    In 4/17, he was admitted with chest pain concerning for unstable angina.  Angiography showed 85% distal LM with 90% ostial LCx stenosis.  LIMA was patent but proximal LAD, proximal RCA, and all SVGs were totally occluded. High had successful DES to LM and PTCA to ostial LCx with Impella support.  [name] was stopped and he was put back on low dose lisinopril due to symptomatic hypotension.  He had Cardiolite in 11/17 with scar, no ischemia.   He was admitted in 1/19 with AKI, creatinine up to 3. Meds held then restarted.   Echo 3/19 with EF 30-35%.   He was admitted with syncope and AKI in 6/19.  He was thought to be dehydrated and orthostatic. Losartan stopped but eventually restarted.   He was noted to have prolonged atrial fibrillation by device check in 3/20, now on Eliquis.   Echo was done in 6/20, showing EF 25-30% with inferior/inferolateral AK, moderate LV dilation, mild LVH, mild MR, severe LAE.   He returns for followup of CHF and CAD.  He has been doing well on higher dose of Lasix.  BP on the lower side today, but he  denies significant dizziness.  He has occasional mild lightheadedness right when he stands up.  He feels like his volume is now better controlled.  No significant exertional dyspnea walking on flat ground.  No chest pain.  No orthopnea/PND.    ECG (personally reviewed): NSR with BiV pacing  Medtronic device interrogation: No atrial fibrillation, fluid index < threshold.    Labs (9/15): LDL particle number [id number], LDL 57, LFTs normal Labs (10/15): K 4.4, creatinine 1.4 Labs (11/15): K 4.3, creatinine 1.36 Labs (12/15): K 4.8, creatinine 1.34 Labs (3/16): K 4, creatinine 1.38, LDL 46, HDl 35 Labs (7/16): K 4, creatinine 1.39, BNP 211 Labs (03/03/15): K 4.1, creatinine 1.47, BNP 227, LDL 80, HDL 32 Labs (12/16): K 4.6, creatinine 1.12, LDL 68, HDL 34 Labs (4/17): K 3.4, creatinine 1.15 Labs (6/17): K 3.8, creatinine 1.67 Labs (7/17): K 4, creatinine 1.35 Labs (10/17): K 3.9, creatinine 1.31, LDL 45, HDL 30 Labs (2/18): K 4, creatinine 1.43 Labs (5/18): LDL 44, HDL 27 Labs (6/18): K 4.1, creatinine 1.49 Labs (12/18): K 4.4, creatinine 1.44 Labs (1/19): K 4.3, creatinine 1.48 Labs (2/19): K 3.7, creatinine 1.6 Labs (6/19): K 4, creatinine 1.5 Labs (9/19): K 4.4, creatinine 1.6, hgb 13.4 Labs (10/19): K 4.3, creatinine 1.44 Labs (1/20): K 4, creatinine 1.65 Labs (6/20): K 3.9, creatinine 1.58, LDL 58, HDL 31, hgb 13.3  PMH: 1. Gout  2. Hyperlipidemia 3. CAD: CABG 1997 and redo 11/12.   - LHC (4/15) with totally occluded LAD, totally occluded RCA, 80% distal LM, 50% mLCx, 2 SVG-RCA grafts totally occluded, 2 SVG-OM grafts totally occluded, patent LIMA-LAD.  Cardiolite prior to 4/15 cath showed lateral wall ischemia (LCx territory).  PCI to distal LM would be a high risk procedure.   - Cardiolite (8/16) with EF 20%, severe scar in RCA and probably LCx territory, minimal peri-infarct ischemia.   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  - Unstable angina  4/17: 85% distal LM with 90% ostial LCx stenosis.  LIMA was patent but proximal LAD, proximal RCA, and all SVGs were totally occluded. He had successful DES to LM and PTCA to ostial LCx with Impella support  - Cardiolite (11/17): EF 20%, prior MI, no ischemia.  4. Ischemic Cardiomyopathy: Medtronic CRT-D device.   - Echo (6/15) with EF 25-30%, moderate LV dilation, inferior and inferolateral akinesis, moderately decreased RV systolic function, mild MR.   - RHC (11/15) with mean RA 5, PA 23/6, mean PCWP 9, CI 2.55.  - Echo (4/17): EF 25-30% with mild MR.  - Echo (11/17): EF 25-30%, moderately dilated LV, grade II diastolic dysfunction, mild-moderate MR, mildly decreased RV systolic function.  - Echo (3/19): EF 30-35%, inferior/inferolateral akinesis, normal RV size with mildly decreased systolic function.  - Echo (6/20): EF 25-30% with inferior/inferolateral AK, moderate LV dilation, mild LVH, mild MR, severe LAE. 5. H/o cholecystectomy 6. OA 7. Depression 8. Type II diabetes 9. GERD 10. CKD stage 3 11. ?Early dementia 12. Atrial fibrillation: Paroxysmal.   SH: Married, prior smoker (many years ago), lives in [place].    FH: CAD  ROS: All systems reviewed and negative except as per HPI.   Current Outpatient Medications  Medication Sig Dispense Refill  . allopurinol (ZYLOPRIM) 100 MG tablet Take 100 mg by mouth 2 (two) times daily.     Marland Kitchen apixaban (ELIQUIS) 5 MG TABS tablet Take 1 tablet (5 mg total) by mouth 2 (two) times daily. 60 tablet 11  . atorvastatin (LIPITOR) 80 MG tablet TAKE 1 TABLET BY MOUTH ONCE DAILY AT 6PM 90 tablet 0  . carvedilol (COREG) 25 MG tablet TAKE 1/2 (ONE-HALF) TABLET BY MOUTH TWICE DAILY WITH A MEAL 90 tablet 0  . eplerenone (INSPRA) 50 MG tablet Take 1 tablet (50 mg total) by mouth daily. 30 tablet 6  . furosemide (LASIX) 40 MG tablet Take 2 tablets (80 mg total) every morning and 1.5 tablets (60 mg total) every evening. 270 tablet 3  . Insulin Glargine  (BASAGLAR KWIKPEN) 100 UNIT/ML SOPN Inject into the skin daily. 30 before breakfast    . insulin regular human CONCENTRATED (HUMULIN R U-500 KWIKPEN) 500 UNIT/ML kwikpen 30 units before BFST, 20 at lunch and 35 before supper, take 30-60 minutes before each meal (Patient taking differently: Inject 20-25 Units into the skin See admin instructions. 20 units in the morning, 25 at lunch, 20 at night., take 30-60 minutes before each meal) 2 pen 3  . losartan (COZAAR) 25 MG tablet Take 0.5 tablets (12.5 mg total) by mouth daily. 45 tablet 3  . nitroGLYCERIN (NITROSTAT) 0.4 MG SL tablet DISSOLVE ONE TABLET UNDER THE TONGUE EVERY 5 MINUTES AS NEEDED FOR CHEST PAIN.  DO NOT EXCEED A TOTAL OF 3 DOSES IN 15 MINUTES 25 tablet 0  . Polyethyl Glycol-Propyl Glycol (LUBRICANT EYE DROPS) 0.4-0.3 % SOLN Place 1 drop into both eyes 3 (three)  times daily as needed (for dry/irritated eyes.).    Marland Kitchen QUEtiapine (SEROQUEL) 100 MG tablet Take 100 mg by mouth at bedtime.   3  . ranolazine (RANEXA) 1000 MG SR tablet Take 500 mg by mouth 2 (two) times daily.    Marland Kitchen triamcinolone cream (KENALOG) 0.1 % Apply 1 application topically 2 (two) times daily as needed (for dry/irritated skin.).    Marland Kitchen venlafaxine (EFFEXOR-XR) 150 MG 24 hr capsule Take 150 mg by mouth daily with breakfast.     . Continuous Blood Gluc Sensor (FREESTYLE LIBRE 14 DAY SENSOR) MISC USE TO CHECK GLUCOSE THREE TIMES DAILY AS DIRECTED     No current facility-administered medications for this encounter.    BP (!) 92/52   Pulse 66   Wt 85.7 kg (189 lb)   SpO2 96%   BMI 27.12 kg/m    Wt Readings from Last 3 Encounters:  11/11/18 85.7 kg (189 lb)  09/24/18 86.1 kg (189 lb 12.8 oz)  07/15/18 88 kg (194 lb)    General: NAD Neck: No JVD, no thyromegaly or thyroid nodule.  Lungs: Clear to auscultation bilaterally with normal respiratory effort. CV: Nondisplaced PMI.  Heart regular S1/S2, no S3/S4, no murmur.  No peripheral edema.  No carotid bruit.  Normal pedal  pulses.  Abdomen: Soft, nontender, no hepatosplenomegaly, no distention.  Skin: Intact without lesions or rashes.  Neurologic: Alert and oriented x 3.  Psych: Normal affect. Extremities: No clubbing or cyanosis.  HEENT: Normal.   Assessment/Plan: 1. Chronic systolic CHF: Ischemic cardiomyopathy.  Echo in 6/20 showed EF 25-30%. He has a Medtronic CRT-D device.  Doing better symptomatically recently, NYHA class II.  He is not volume overloaded by exam or Optivol.   - Continue Lasix 80 qam/60 qpm.  BMET today.     - Continue eplerenone 50 mg daily.   - BP did not tolerate Entresto.  Had ACEI cough. Continue losartan 12.5 mg daily, no BP room to titrate up.   - Continue Coreg 12.5 mg bid. 2. CAD: s/p CABG and redo. Had DES to distal LM, angioplasty to ostial LCx in 4/17. Cardiolite 11/17 with scar, no ischemia.  No recent chest pain.  - Continue statin.  - He is off ASA/Plavix and taking apixaban 5 mg bid.  - Continue Coreg and ranolazine 500 mg bid.  3. CKD stage III: BMET today.   4. Hyperlipidemia: Continue statin.  Good lipids in 6/20.    5. Atrial fibrillation: Paroxysmal. No recent atrial fibrillation on device check today.   - Continue apixaban.     Followup in 3 months    [name] 11/11/2018

## 2018-11-12 ENCOUNTER — Ambulatory Visit (INDEPENDENT_AMBULATORY_CARE_PROVIDER_SITE_OTHER): Payer: Medicare HMO

## 2018-11-12 DIAGNOSIS — I5022 Chronic systolic (congestive) heart failure: Secondary | ICD-10-CM | POA: Diagnosis not present

## 2018-11-12 DIAGNOSIS — Z95810 Presence of automatic (implantable) cardiac defibrillator: Secondary | ICD-10-CM

## 2018-11-13 ENCOUNTER — Ambulatory Visit (INDEPENDENT_AMBULATORY_CARE_PROVIDER_SITE_OTHER): Payer: Medicare HMO | Admitting: *Deleted

## 2018-11-13 DIAGNOSIS — I480 Paroxysmal atrial fibrillation: Secondary | ICD-10-CM

## 2018-11-13 DIAGNOSIS — I255 Ischemic cardiomyopathy: Secondary | ICD-10-CM

## 2018-11-13 LAB — CUP PACEART REMOTE DEVICE CHECK
Battery Remaining Longevity: 90 mo
Battery Voltage: 2.99 V
Brady Statistic AP VP Percent: 8.42 %
Brady Statistic AP VS Percent: 0.04 %
Brady Statistic AS VP Percent: 91.25 %
Brady Statistic AS VS Percent: 0.29 %
Brady Statistic RA Percent Paced: 8.11 %
Brady Statistic RV Percent Paced: 95.81 %
Date Time Interrogation Session: 20200729094347
HighPow Impedance: 38 Ohm
HighPow Impedance: 48 Ohm
Implantable Lead Implant Date: 20111003
Implantable Lead Implant Date: 20111003
Implantable Lead Implant Date: 20130509
Implantable Lead Location: 753858
Implantable Lead Location: 753859
Implantable Lead Location: 753860
Implantable Lead Model: 4196
Implantable Lead Model: 5076
Implantable Lead Model: 6947
Implantable Pulse Generator Implant Date: 20191030
Lead Channel Impedance Value: 342 Ohm
Lead Channel Impedance Value: 380 Ohm
Lead Channel Impedance Value: 380 Ohm
Lead Channel Impedance Value: 456 Ohm
Lead Channel Impedance Value: 494 Ohm
Lead Channel Impedance Value: 646 Ohm
Lead Channel Pacing Threshold Amplitude: 0.625 V
Lead Channel Pacing Threshold Amplitude: 0.875 V
Lead Channel Pacing Threshold Amplitude: 2.25 V
Lead Channel Pacing Threshold Pulse Width: 0.4 ms
Lead Channel Pacing Threshold Pulse Width: 0.4 ms
Lead Channel Pacing Threshold Pulse Width: 0.4 ms
Lead Channel Sensing Intrinsic Amplitude: 1.125 mV
Lead Channel Sensing Intrinsic Amplitude: 1.125 mV
Lead Channel Sensing Intrinsic Amplitude: 31.625 mV
Lead Channel Sensing Intrinsic Amplitude: 31.625 mV
Lead Channel Setting Pacing Amplitude: 1.5 V
Lead Channel Setting Pacing Amplitude: 2 V
Lead Channel Setting Pacing Amplitude: 4.75 V
Lead Channel Setting Pacing Pulse Width: 0.4 ms
Lead Channel Setting Pacing Pulse Width: 0.4 ms
Lead Channel Setting Sensing Sensitivity: 0.3 mV

## 2018-11-15 NOTE — Progress Notes (Signed)
EPIC Encounter for ICM Monitoring  Patient Name: [name] is a 78 y.o. male Date: 11/15/2018 Primary Care Physican: Mayra Neer, MD Primary Cardiologist:[place] Electrophysiologist: Allred Bi-V Pacing: 94.6% 10/14/2018 Weight: 189 lbs   Transmission Reviewed.Patient seen in office by Dr Aundra Dubin 11/11/2018.  OptiVolThoracic impedance normal.  Prescribed:Furosemide40 mgtake 2 tablets (80 mg total) every morning and 1.5tablets(60 mg total) every afternoon.  Labs:  11/11/2018 Creatinine 1.52, BUN 28, Potassium 3.9, Sodium 136, GFR 44-51 10/03/2018 Creatinine 1.58, BUN 28, Potassium 3.9, Sodium 138, GFR 42-49 09/24/2018 Creatinine 1.47, BUN 23, Potassium 4.0, Sodium 140, GFR 46-53  05/03/2018 Creatinine1.65, BUN27, Potassium4.0, Sodium140, [id number] 04/26/2018 Creatinine1.91, BUN39, Potassium3.9, Sodium135, [id number] A complete set of results can be found in Results Review.  Recommendations:None  Follow-up plan: ICM clinic phone appointment on8/31/2020.  Copy of ICM check sent to Dr.Allred.  3 month ICM trend: 11/12/2018    1 Year ICM trend:       [name], RN 11/15/2018 11:30 AM

## 2018-11-19 ENCOUNTER — Other Ambulatory Visit (HOSPITAL_COMMUNITY): Payer: Self-pay | Admitting: Cardiology

## 2018-11-22 NOTE — Progress Notes (Signed)
Remote ICD transmission.   

## 2018-12-15 ENCOUNTER — Other Ambulatory Visit (HOSPITAL_COMMUNITY): Payer: Self-pay | Admitting: Cardiology

## 2018-12-16 ENCOUNTER — Ambulatory Visit (INDEPENDENT_AMBULATORY_CARE_PROVIDER_SITE_OTHER): Payer: Medicare HMO

## 2018-12-16 DIAGNOSIS — I5022 Chronic systolic (congestive) heart failure: Secondary | ICD-10-CM | POA: Diagnosis not present

## 2018-12-16 DIAGNOSIS — Z95810 Presence of automatic (implantable) cardiac defibrillator: Secondary | ICD-10-CM

## 2018-12-18 NOTE — Progress Notes (Signed)
EPIC Encounter for ICM Monitoring  Patient Name: [name] is a 78 y.o. male Date: 12/18/2018 Primary Care Physican: Mayra Neer, MD Primary Cardiologist:[place] Electrophysiologist: Allred Bi-V Pacing: 94% 12/18/2018 Weight: 186-189 lbs   Spoke with wife. Patient is doing fine and denies any fluid symptoms.   OptiVolThoracic impedance normal.  Prescribed:Furosemide40 mgtake 2 tablets (80 mg total) every morning and1.5tablets(60 mg total)every afternoon.  Labs: 11/11/2018 Creatinine 1.52, BUN 28, Potassium 3.9, Sodium 136, GFR 44-51 10/03/2018 Creatinine1.58, BUN28, Potassium3.9, [id number], [id number] 09/24/2018 Creatinine1.47, BUN23, Potassium4.0, Sodium140, [id number] 05/03/2018 Creatinine1.65, BUN27, Potassium4.0, Sodium140, [id number] 04/26/2018 Creatinine1.91, BUN39, Potassium3.9, Sodium135, [id number] Acomplete set of results can be found in Results Review.  Recommendations: Reinforced limiting salt intake to < 2000 mg daily and fluid intake to 64 oz daily.  Encouraged to call if experiencing fluid symptoms.  Follow-up plan: ICM clinic phone appointment on 02/03/2019.   91 day device clinic remote transmission 02/13/2019.  Office appt 02/11/2019 with Dr. Aundra Dubin.    Copy of ICM check sent to Dr. Rayann Heman.   3 month ICM trend: 12/16/2018    1 Year ICM trend:       [name], RN 12/18/2018 8:14 AM

## 2019-01-10 DIAGNOSIS — Z794 Long term (current) use of insulin: Secondary | ICD-10-CM | POA: Diagnosis not present

## 2019-01-10 DIAGNOSIS — I130 Hypertensive heart and chronic kidney disease with heart failure and stage 1 through stage 4 chronic kidney disease, or unspecified chronic kidney disease: Secondary | ICD-10-CM | POA: Diagnosis not present

## 2019-01-10 DIAGNOSIS — N183 Chronic kidney disease, stage 3 (moderate): Secondary | ICD-10-CM | POA: Diagnosis not present

## 2019-01-10 DIAGNOSIS — D519 Vitamin B12 deficiency anemia, unspecified: Secondary | ICD-10-CM | POA: Diagnosis not present

## 2019-01-10 DIAGNOSIS — E7800 Pure hypercholesterolemia, unspecified: Secondary | ICD-10-CM | POA: Diagnosis not present

## 2019-01-10 DIAGNOSIS — E1121 Type 2 diabetes mellitus with diabetic nephropathy: Secondary | ICD-10-CM | POA: Diagnosis not present

## 2019-01-15 DIAGNOSIS — E7800 Pure hypercholesterolemia, unspecified: Secondary | ICD-10-CM | POA: Diagnosis not present

## 2019-01-15 DIAGNOSIS — I130 Hypertensive heart and chronic kidney disease with heart failure and stage 1 through stage 4 chronic kidney disease, or unspecified chronic kidney disease: Secondary | ICD-10-CM | POA: Diagnosis not present

## 2019-01-15 DIAGNOSIS — R69 Illness, unspecified: Secondary | ICD-10-CM | POA: Diagnosis not present

## 2019-01-15 DIAGNOSIS — I5022 Chronic systolic (congestive) heart failure: Secondary | ICD-10-CM | POA: Diagnosis not present

## 2019-01-15 DIAGNOSIS — E1121 Type 2 diabetes mellitus with diabetic nephropathy: Secondary | ICD-10-CM | POA: Diagnosis not present

## 2019-01-15 DIAGNOSIS — D519 Vitamin B12 deficiency anemia, unspecified: Secondary | ICD-10-CM | POA: Diagnosis not present

## 2019-01-15 DIAGNOSIS — L409 Psoriasis, unspecified: Secondary | ICD-10-CM | POA: Diagnosis not present

## 2019-01-15 DIAGNOSIS — R413 Other amnesia: Secondary | ICD-10-CM | POA: Diagnosis not present

## 2019-01-15 DIAGNOSIS — I4891 Unspecified atrial fibrillation: Secondary | ICD-10-CM | POA: Diagnosis not present

## 2019-01-15 DIAGNOSIS — N183 Chronic kidney disease, stage 3 (moderate): Secondary | ICD-10-CM | POA: Diagnosis not present

## 2019-01-18 DIAGNOSIS — Z23 Encounter for immunization: Secondary | ICD-10-CM | POA: Diagnosis not present

## 2019-02-03 ENCOUNTER — Ambulatory Visit: Payer: Medicare HMO

## 2019-02-03 DIAGNOSIS — Z95810 Presence of automatic (implantable) cardiac defibrillator: Secondary | ICD-10-CM

## 2019-02-03 DIAGNOSIS — I5022 Chronic systolic (congestive) heart failure: Secondary | ICD-10-CM

## 2019-02-04 NOTE — Progress Notes (Signed)
EPIC Encounter for ICM Monitoring  Patient Name: [name] is a 78 y.o. male Date: 02/04/2019 Primary Care Physican: Mayra Neer, MD Primary Cardiologist:[place] Electrophysiologist: Allred Bi-V Pacing: 96.6% 02/04/2019 Weight: 186-188 lbs   Spoke with wife. Patient is doing fine and denies any fluid symptoms.   [place] injuries.  OptiVolThoracicimpedance normal.  Prescribed:Furosemide40 mgtake 2 tablets (80 mg total) every morning and1.5tablets(60 mg total)every afternoon.  Labs: 11/11/2018 Creatinine 1.52, BUN 28, Potassium 3.9, Sodium 136, GFR 44-51 10/03/2018 Creatinine1.58, BUN28, Potassium3.9, [id number], [id number] 09/24/2018 Creatinine1.47, BUN23, Potassium4.0, Sodium140, [id number] 05/03/2018 Creatinine1.65, BUN27, Potassium4.0, Sodium140, [id number] 04/26/2018 Creatinine1.91, BUN39, Potassium3.9, Sodium135, [id number] Acomplete set of results can be found in Results Review.  Recommendations:  Reinforced limiting salt intake to < 2000 mg daily and fluid intake to 64 oz daily.  Encouraged to call if experiencing fluid symptoms.  Follow-up plan: ICM clinic phone appointment on 03/17/2019.   91 day device clinic remote transmission 02/13/2019.  Office appt 02/11/2019 with Dr. Aundra Dubin.    Copy of ICM check sent to Dr. Rayann Heman.   3 month ICM trend: 02/03/2019    1 Year ICM trend:       [name], RN 02/04/2019 4:44 PM

## 2019-02-11 ENCOUNTER — Ambulatory Visit (HOSPITAL_COMMUNITY)
Admission: RE | Admit: 2019-02-11 | Discharge: 2019-02-11 | Disposition: A | Discharge status: Home / Self Care | Payer: Medicare HMO | Source: Ambulatory Visit | Attending: Cardiology | Admitting: Cardiology

## 2019-02-11 ENCOUNTER — Encounter (HOSPITAL_COMMUNITY): Payer: Self-pay | Admitting: Cardiology

## 2019-02-11 ENCOUNTER — Other Ambulatory Visit: Payer: Self-pay

## 2019-02-11 VITALS — BP 110/68 | HR 67 | Wt 191.6 lb

## 2019-02-11 DIAGNOSIS — Z79899 Other long term (current) drug therapy: Secondary | ICD-10-CM | POA: Insufficient documentation

## 2019-02-11 DIAGNOSIS — I255 Ischemic cardiomyopathy: Secondary | ICD-10-CM | POA: Diagnosis not present

## 2019-02-11 DIAGNOSIS — F329 Major depressive disorder, single episode, unspecified: Secondary | ICD-10-CM | POA: Diagnosis not present

## 2019-02-11 DIAGNOSIS — M109 Gout, unspecified: Secondary | ICD-10-CM | POA: Insufficient documentation

## 2019-02-11 DIAGNOSIS — Z955 Presence of coronary angioplasty implant and graft: Secondary | ICD-10-CM | POA: Insufficient documentation

## 2019-02-11 DIAGNOSIS — Z7902 Long term (current) use of antithrombotics/antiplatelets: Secondary | ICD-10-CM | POA: Insufficient documentation

## 2019-02-11 DIAGNOSIS — I252 Old myocardial infarction: Secondary | ICD-10-CM | POA: Diagnosis not present

## 2019-02-11 DIAGNOSIS — E1122 Type 2 diabetes mellitus with diabetic chronic kidney disease: Secondary | ICD-10-CM | POA: Diagnosis not present

## 2019-02-11 DIAGNOSIS — E785 Hyperlipidemia, unspecified: Secondary | ICD-10-CM | POA: Insufficient documentation

## 2019-02-11 DIAGNOSIS — I5022 Chronic systolic (congestive) heart failure: Secondary | ICD-10-CM | POA: Insufficient documentation

## 2019-02-11 DIAGNOSIS — Z8249 Family history of ischemic heart disease and other diseases of the circulatory system: Secondary | ICD-10-CM | POA: Insufficient documentation

## 2019-02-11 DIAGNOSIS — I480 Paroxysmal atrial fibrillation: Secondary | ICD-10-CM | POA: Diagnosis not present

## 2019-02-11 DIAGNOSIS — Z7901 Long term (current) use of anticoagulants: Secondary | ICD-10-CM | POA: Diagnosis not present

## 2019-02-11 DIAGNOSIS — Z87891 Personal history of nicotine dependence: Secondary | ICD-10-CM | POA: Insufficient documentation

## 2019-02-11 DIAGNOSIS — N183 Chronic kidney disease, stage 3 unspecified: Secondary | ICD-10-CM | POA: Insufficient documentation

## 2019-02-11 DIAGNOSIS — Z951 Presence of aortocoronary bypass graft: Secondary | ICD-10-CM | POA: Insufficient documentation

## 2019-02-11 DIAGNOSIS — I2510 Atherosclerotic heart disease of native coronary artery without angina pectoris: Secondary | ICD-10-CM | POA: Diagnosis not present

## 2019-02-11 DIAGNOSIS — Z794 Long term (current) use of insulin: Secondary | ICD-10-CM | POA: Diagnosis not present

## 2019-02-11 DIAGNOSIS — R69 Illness, unspecified: Secondary | ICD-10-CM | POA: Diagnosis not present

## 2019-02-11 LAB — BASIC METABOLIC PANEL
Anion gap: 11 (ref 5–15)
BUN: 26 mg/dL — ABNORMAL HIGH (ref 8–23)
CO2: 27 mmol/L (ref 22–32)
Calcium: 8.8 mg/dL — ABNORMAL LOW (ref 8.9–10.3)
Chloride: 101 mmol/L (ref 98–111)
Creatinine, Ser: 1.7 mg/dL — ABNORMAL HIGH (ref 0.61–1.24)
GFR calc Af Amer: 44 mL/min — ABNORMAL LOW (ref >60)
GFR calc non Af Amer: 38 mL/min — ABNORMAL LOW (ref >60)
Glucose, Bld: 125 mg/dL — ABNORMAL HIGH (ref 70–99)
Potassium: 4 mmol/L (ref 3.5–5.1)
Sodium: 139 mmol/L (ref 135–145)

## 2019-02-11 MED ORDER — CARVEDILOL 6.25 MG PO TABS: 6.2500 mg | ORAL_TABLET | Freq: Two times a day (BID) | ORAL | 1 refills | Status: DC

## 2019-02-11 NOTE — Patient Instructions (Signed)
DECREASE Coreg 6.25mg  (1 tab) twice a day  Labs today We will only contact you if something comes back abnormal or we need to make some changes. Otherwise no news is good news!  Your physician recommends that you schedule a follow-up appointment in: 1 month with Dr Aundra Dubin  Your physician has recommended that you have a cardiopulmonary stress test (CPX). CPX testing is a non-invasive measurement of heart and lung function. It replaces a traditional treadmill stress test. This type of test provides a tremendous amount of information that relates not only to your present condition but also for future outcomes. This test combines measurements of you ventilation, respiratory gas exchange in the lungs, electrocardiogram (EKG), blood pressure and physical response before, during, and following an exercise protocol.  At the [facility], you and your health needs are our priority. As part of our continuing mission to provide you with exceptional heart care, we have created designated Provider Care Teams. These Care Teams include your primary Cardiologist (physician) and Advanced Practice Providers (APPs- Physician Assistants and Nurse Practitioners) who all work together to provide you with the care you need, when you need it.   You may see any of the following providers on your designated Care Team at your next follow up: Marland Kitchen Dr Glori Bickers . Dr [name] . Darrick Grinder, NP . Lyda Jester, PA   Please be sure to bring in all your medications bottles to every appointment.

## 2019-02-11 NOTE — Progress Notes (Signed)
Advanced Heart Failure Clinic Note   Patient ID: [name], male   DOB: 1940/06/28, 78 y.o.   MRN: [id number] PCP: Dr. Lynnda Child Cardiology: Dr Aundra Dubin  67 y.o. with history of CAD s/p CABG and redo CABG as well as ischemic cardiomyopathy with CRT-D device presents for followup of CHF and CAD.  He had a Cardiolite in the 4/15 showing lateral wall ischemia.  LHC at that time showed all his vein grafts from both CABG surgeries occluded.  The LIMA-LAD was patent and there was a 90% distal LM stenosis.  The lateral ischemia likely corresponded to LCx territory downstream from the LM stenosis.  PCI of the distal LM was thought to be high risk and characteristics of the lesion were not favorable for PCI.  Last echo showed EF 25-30% with moderate RV systolic dysfunction.  He had RHC in 11/15 with normal filling pressures and relatively preserved cardiac index (2.55).    In 4/17, he was admitted with chest pain concerning for unstable angina.  Angiography showed 85% distal LM with 90% ostial LCx stenosis.  LIMA was patent but proximal LAD, proximal RCA, and all SVGs were totally occluded. High had successful DES to LM and PTCA to ostial LCx with Impella support.  [name] was stopped and he was put back on low dose lisinopril due to symptomatic hypotension.  He had Cardiolite in 11/17 with scar, no ischemia.   He was admitted in 1/19 with AKI, creatinine up to 3. Meds held then restarted.   Echo 3/19 with EF 30-35%.   He was admitted with syncope and AKI in 6/19.  He was thought to be dehydrated and orthostatic. Losartan stopped but eventually restarted.   He was noted to have prolonged atrial fibrillation by device check in 3/20, now on Eliquis.   Echo was done in 6/20, showing EF 25-30% with inferior/inferolateral AK, moderate LV dilation, mild LVH, mild MR, severe LAE.   He returns for followup of CHF and CAD.  He has lightheadedness with standing at times. [place], no significant injury. He was lightheaded prior to tripping.  He was not orthostatic when checked today but SBP low around 100.  No dyspnea walking on flat ground but he is short of breath walking up an incline.  He has been more fatigued in general recently.  He is able to work in his yard about 15-20 minutes then gets tired and has to take a break.  He is able to wash his truck but takes frequent breaks. No chest pain. Weight is up 2 lbs.   ECG (personally reviewed): NSR with BiV pacing  Medtronic device interrogation: No atrial fibrillation, fluid index < threshold with stable thoracic impedance.   Labs (9/15): LDL particle number 816, LDL 57, LFTs normal Labs (10/15): K 4.4, creatinine 1.4 Labs (11/15): K 4.3, creatinine 1.36 Labs (12/15): K 4.8, creatinine 1.34 Labs (3/16): K 4, creatinine 1.38, LDL 46, HDl 35 Labs (7/16): K 4, creatinine 1.39, BNP 211 Labs (03/03/15): K 4.1, creatinine 1.47, BNP 227, LDL 80, HDL 32 Labs (12/16): K 4.6, creatinine 1.12, LDL 68, HDL 34 Labs (4/17): K 3.4, creatinine 1.15 Labs (6/17): K 3.8, creatinine 1.67 Labs (7/17): K 4, creatinine 1.35 Labs (10/17): K 3.9, creatinine 1.31, LDL 45, HDL 30 Labs (2/18): K 4, creatinine 1.43 Labs (5/18): LDL 44, HDL 27 Labs (6/18): K 4.1, creatinine 1.49 Labs (12/18): K 4.4, creatinine 1.44 Labs (1/19): K 4.3,  creatinine 1.48 Labs (2/19): K 3.7, creatinine 1.6 Labs (6/19): K 4, creatinine 1.5 Labs (9/19): K 4.4, creatinine 1.6, hgb 13.4 Labs (10/19): K 4.3, creatinine 1.44 Labs (1/20): K 4, creatinine 1.65 Labs (6/20): K 3.9, creatinine 1.58, LDL 58, HDL 31, hgb 13.3 Labs (7/20): K 3.9, creatinine 1.5  PMH: 1. Gout 2. Hyperlipidemia 3. CAD: CABG 1997 and redo 11/12.   - LHC (4/15) with totally occluded LAD, totally occluded RCA, 80% distal LM, 50% mLCx, 2 SVG-RCA grafts totally occluded, 2 SVG-OM grafts totally occluded, patent LIMA-LAD.  Cardiolite prior to 4/15 cath showed lateral wall  ischemia (LCx territory).  PCI to distal LM would be a high risk procedure.   - Cardiolite (8/16) with EF 20%, severe scar in RCA and probably LCx territory, minimal peri-infarct ischemia.   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  - Unstable angina 4/17: 85% distal LM with 90% ostial LCx stenosis.  LIMA was patent but proximal LAD, proximal RCA, and all SVGs were totally occluded. He had successful DES to LM and PTCA to ostial LCx with Impella support  - Cardiolite (11/17): EF 20%, prior MI, no ischemia.  4. Ischemic Cardiomyopathy: Medtronic CRT-D device.   - Echo (6/15) with EF 25-30%, moderate LV dilation, inferior and inferolateral akinesis, moderately decreased RV systolic function, mild MR.   - RHC (11/15) with mean RA 5, PA 23/6, mean PCWP 9, CI 2.55.  - Echo (4/17): EF 25-30% with mild MR.  - Echo (11/17): EF 25-30%, moderately dilated LV, grade II diastolic dysfunction, mild-moderate MR, mildly decreased RV systolic function.  - Echo (3/19): EF 30-35%, inferior/inferolateral akinesis, normal RV size with mildly decreased systolic function.  - Echo (6/20): EF 25-30% with inferior/inferolateral AK, moderate LV dilation, mild LVH, mild MR, severe LAE. 5. H/o cholecystectomy 6. OA 7. Depression 8. Type II diabetes 9. GERD 10. CKD stage 3 11. ?Early dementia 12. Atrial fibrillation: Paroxysmal.  13. B12 deficiency.   SH: Married, prior smoker (many years ago), lives in [place].    FH: CAD  ROS: All systems reviewed and negative except as per HPI.   Current Outpatient Medications  Medication Sig Dispense Refill   allopurinol (ZYLOPRIM) 100 MG tablet Take 100 mg by mouth 2 (two) times daily.      apixaban (ELIQUIS) 5 MG TABS tablet Take 1 tablet (5 mg total) by mouth 2 (two) times daily. 60 tablet 11   atorvastatin (LIPITOR) 80 MG tablet TAKE 1 TABLET BY MOUTH ONCE DAILY AT  6  PM 90 tablet 1   Continuous Blood Gluc Sensor (FREESTYLE LIBRE  14 DAY SENSOR) MISC USE TO CHECK GLUCOSE THREE TIMES DAILY AS DIRECTED     eplerenone (INSPRA) 50 MG tablet Take 1 tablet by mouth once daily 30 tablet 5   furosemide (LASIX) 40 MG tablet Take 2 tablets (80 mg total) every morning and 1.5 tablets (60 mg total) every evening. 270 tablet 3   Insulin Glargine (BASAGLAR KWIKPEN) 100 UNIT/ML SOPN Inject into the skin daily. 30 before breakfast     insulin regular human CONCENTRATED (HUMULIN R U-500 KWIKPEN) 500 UNIT/ML kwikpen 30 units before BFST, 20 at lunch and 35 before supper, take 30-60 minutes before each meal (Patient taking differently: Inject 20-25 Units into the skin See admin instructions. 25 at lunch, 20 at super.,if snack after dinner take an additional 10u at night take 30-60 minutes before each meal) 2 pen 3   losartan (COZAAR) 25 MG tablet Take  0.5 tablets (12.5 mg total) by mouth daily. 45 tablet 3   nitroGLYCERIN (NITROSTAT) 0.4 MG SL tablet DISSOLVE ONE TABLET UNDER THE TONGUE EVERY 5 MINUTES AS NEEDED FOR CHEST PAIN.  DO NOT EXCEED A TOTAL OF 3 DOSES IN 15 MINUTES 25 tablet 0   Polyethyl Glycol-Propyl Glycol (LUBRICANT EYE DROPS) 0.4-0.3 % SOLN Place 1 drop into both eyes 3 (three) times daily as needed (for dry/irritated eyes.).     QUEtiapine (SEROQUEL) 100 MG tablet Take 100 mg by mouth at bedtime.   3   ranolazine (RANEXA) 1000 MG SR tablet Take 500 mg by mouth 2 (two) times daily.     triamcinolone cream (KENALOG) 0.1 % Apply 1 application topically 2 (two) times daily as needed (for dry/irritated skin.).     venlafaxine (EFFEXOR-XR) 150 MG 24 hr capsule Take 150 mg by mouth daily with breakfast.      vitamin B-12 (CYANOCOBALAMIN) 1000 MCG tablet Take 1,000 mcg by mouth daily.     carvedilol (COREG) 6.25 MG tablet Take 1 tablet (6.25 mg total) by mouth 2 (two) times daily. 180 tablet 1   No current facility-administered medications for this encounter.    BP 110/68    Pulse 67    Wt 86.9 kg (191 lb 9.6 oz)    SpO2  96%    BMI 27.49 kg/m    Wt Readings from Last 3 Encounters:  02/11/19 86.9 kg (191 lb 9.6 oz)  11/11/18 85.7 kg (189 lb)  09/24/18 86.1 kg (189 lb 12.8 oz)    General: NAD Neck: No JVD, no thyromegaly or thyroid nodule.  Lungs: Clear to auscultation bilaterally with normal respiratory effort. CV: Nondisplaced PMI.  Heart regular S1/S2, no S3/S4, no murmur.  No peripheral edema.  No carotid bruit.  Normal pedal pulses.  Abdomen: Soft, nontender, no hepatosplenomegaly, no distention.  Skin: Intact without lesions or rashes.  Neurologic: Alert and oriented x 3.  Psych: Normal affect. Extremities: No clubbing or cyanosis.  HEENT: Normal.   Assessment/Plan: 1. Chronic systolic CHF: Ischemic cardiomyopathy.  Echo in 6/20 showed EF 25-30%. He has a Medtronic CRT-D device.  NYHA class II-III symptoms.  I am concerned about him with increasing fatigue, worsening creatinine, and orthostatic-type symptoms. It is possible that he is developing low output symptom.  - I will have him decrease Coreg to 6.25 mg bid with lightheadedness/fall.  - Continue Lasix 80 qam/60 qpm.  BMET today.     - Continue eplerenone 50 mg daily.   - BP did not tolerate Entresto.  Had ACEI cough. Continue losartan 12.5 mg daily, no BP room to titrate up.   - I will arrange for CPX to assess functional capacity.  If this shows severe dysfunction due to HF, will need to talk to him about RHC.  2. CAD: s/p CABG and redo. Had DES to distal LM, angioplasty to ostial LCx in 4/17. Cardiolite 11/17 with scar, no ischemia.  No recent chest pain.  - Continue statin.  - He is off ASA/Plavix and taking apixaban 5 mg bid.  - Continue Coreg and ranolazine 500 mg bid.  3. CKD stage III: BMET today.   4. Hyperlipidemia: Continue statin.  Good lipids in 6/20.    5. Atrial fibrillation: Paroxysmal. No recent atrial fibrillation on device check today.   - Continue apixaban.     Followup in 1 month    [name] 02/11/2019

## 2019-02-13 ENCOUNTER — Ambulatory Visit (INDEPENDENT_AMBULATORY_CARE_PROVIDER_SITE_OTHER): Payer: Medicare HMO | Admitting: *Deleted

## 2019-02-13 DIAGNOSIS — I480 Paroxysmal atrial fibrillation: Secondary | ICD-10-CM

## 2019-02-13 DIAGNOSIS — R55 Syncope and collapse: Secondary | ICD-10-CM | POA: Diagnosis not present

## 2019-02-13 LAB — CUP PACEART REMOTE DEVICE CHECK
Battery Remaining Longevity: 87 mo
Battery Voltage: 2.98 V
Brady Statistic AP VP Percent: 4.33 %
Brady Statistic AP VS Percent: 0.02 %
Brady Statistic AS VP Percent: 95.32 %
Brady Statistic AS VS Percent: 0.33 %
Brady Statistic RA Percent Paced: 4.25 %
Brady Statistic RV Percent Paced: 96.83 %
Date Time Interrogation Session: 20201029041602
HighPow Impedance: 40 Ohm
HighPow Impedance: 53 Ohm
Implantable Lead Implant Date: 20111003
Implantable Lead Implant Date: 20111003
Implantable Lead Implant Date: 20130509
Implantable Lead Location: 753858
Implantable Lead Location: 753859
Implantable Lead Location: 753860
Implantable Lead Model: 4196
Implantable Lead Model: 5076
Implantable Lead Model: 6947
Implantable Pulse Generator Implant Date: 20191030
Lead Channel Impedance Value: 342 Ohm
Lead Channel Impedance Value: 380 Ohm
Lead Channel Impedance Value: 399 Ohm
Lead Channel Impedance Value: 456 Ohm
Lead Channel Impedance Value: 494 Ohm
Lead Channel Impedance Value: 646 Ohm
Lead Channel Pacing Threshold Amplitude: 0.625 V
Lead Channel Pacing Threshold Amplitude: 0.875 V
Lead Channel Pacing Threshold Amplitude: 2.125 V
Lead Channel Pacing Threshold Pulse Width: 0.4 ms
Lead Channel Pacing Threshold Pulse Width: 0.4 ms
Lead Channel Pacing Threshold Pulse Width: 0.4 ms
Lead Channel Sensing Intrinsic Amplitude: 1.25 mV
Lead Channel Sensing Intrinsic Amplitude: 31.625 mV
Lead Channel Setting Pacing Amplitude: 1.5 V
Lead Channel Setting Pacing Amplitude: 2 V
Lead Channel Setting Pacing Amplitude: 4.5 V
Lead Channel Setting Pacing Pulse Width: 0.4 ms
Lead Channel Setting Pacing Pulse Width: 0.4 ms
Lead Channel Setting Sensing Sensitivity: 0.3 mV

## 2019-02-20 ENCOUNTER — Other Ambulatory Visit: Payer: Self-pay

## 2019-02-21 ENCOUNTER — Other Ambulatory Visit: Payer: Self-pay

## 2019-02-21 ENCOUNTER — Other Ambulatory Visit (HOSPITAL_COMMUNITY)
Admission: RE | Admit: 2019-02-21 | Discharge: 2019-02-21 | Disposition: A | Discharge status: Home / Self Care | Payer: Medicare HMO | Source: Ambulatory Visit | Attending: Cardiology | Admitting: Cardiology

## 2019-02-21 DIAGNOSIS — Z20828 Contact with and (suspected) exposure to other viral communicable diseases: Secondary | ICD-10-CM | POA: Insufficient documentation

## 2019-02-21 DIAGNOSIS — Z20822 Contact with and (suspected) exposure to covid-19: Secondary | ICD-10-CM

## 2019-02-21 DIAGNOSIS — Z01812 Encounter for preprocedural laboratory examination: Secondary | ICD-10-CM | POA: Insufficient documentation

## 2019-02-21 LAB — SARS CORONAVIRUS 2 (TAT 6-24 HRS): SARS Coronavirus 2: NEGATIVE

## 2019-02-21 NOTE — Addendum Note (Signed)
Addended byDanielle Dess on: 02/21/2019 09:16 AM   Modules accepted: Orders

## 2019-02-24 ENCOUNTER — Encounter (HOSPITAL_COMMUNITY): Payer: Self-pay | Admitting: *Deleted

## 2019-02-24 ENCOUNTER — Ambulatory Visit (HOSPITAL_COMMUNITY): Payer: Medicare HMO | Attending: Cardiology

## 2019-02-24 ENCOUNTER — Other Ambulatory Visit (HOSPITAL_COMMUNITY): Payer: Self-pay | Admitting: *Deleted

## 2019-02-24 ENCOUNTER — Other Ambulatory Visit: Payer: Self-pay

## 2019-02-24 DIAGNOSIS — I5022 Chronic systolic (congestive) heart failure: Secondary | ICD-10-CM

## 2019-03-05 ENCOUNTER — Other Ambulatory Visit: Payer: Self-pay | Admitting: Internal Medicine

## 2019-03-07 NOTE — Progress Notes (Signed)
Remote ICD transmission.   

## 2019-03-17 ENCOUNTER — Ambulatory Visit (INDEPENDENT_AMBULATORY_CARE_PROVIDER_SITE_OTHER): Payer: Medicare HMO

## 2019-03-17 DIAGNOSIS — I5022 Chronic systolic (congestive) heart failure: Secondary | ICD-10-CM

## 2019-03-17 DIAGNOSIS — Z95810 Presence of automatic (implantable) cardiac defibrillator: Secondary | ICD-10-CM | POA: Diagnosis not present

## 2019-03-19 NOTE — Progress Notes (Signed)
EPIC Encounter for ICM Monitoring  Patient Name: [name] is a 78 y.o. male Date: 03/19/2019 Primary Care Physican: Mayra Neer, MD Primary Cardiologist:[facility] Electrophysiologist: Allred Bi-V Pacing: 95.6% 02/04/2019 Weight:186-188 lbs   Transmission reviewed.   OptiVolThoracicimpedance normal.  Prescribed:Furosemide40 mgtake 2 tablets (80 mg total) every morning and1.5tablets(60 mg total)every afternoon.  Labs: 11/11/2018 Creatinine 1.52, BUN 28, Potassium 3.9, Sodium 136, GFR 44-51 10/03/2018 Creatinine1.58, BUN28, Potassium3.9, [id number], [id number] 09/24/2018 Creatinine1.47, BUN23, Potassium4.0, Sodium140, [id number] 05/03/2018 Creatinine1.65, BUN27, Potassium4.0, Sodium140, [id number] 04/26/2018 Creatinine1.91, BUN39, Potassium3.9, Sodium135, [id number] Acomplete set of results can be found in Results Review.  Recommendations: None  Follow-up plan: ICM clinic phone appointment on 04/21/2019.   91 day device clinic remote transmission 05/15/2019.  Office appt 03/20/2019 with Dr. Aundra Dubin.    Copy of ICM check sent to Dr. Rayann Heman.   3 month ICM trend: 03/17/2019    1 Year ICM trend:       [name], RN 03/19/2019 11:54 AM

## 2019-03-20 ENCOUNTER — Ambulatory Visit (HOSPITAL_COMMUNITY)
Admission: RE | Admit: 2019-03-20 | Discharge: 2019-03-20 | Disposition: A | Discharge status: Home / Self Care | Payer: Medicare HMO | Source: Ambulatory Visit | Attending: Cardiology | Admitting: Cardiology

## 2019-03-20 ENCOUNTER — Other Ambulatory Visit: Payer: Self-pay

## 2019-03-20 VITALS — BP 120/72 | HR 80 | Wt 191.0 lb

## 2019-03-20 DIAGNOSIS — I480 Paroxysmal atrial fibrillation: Secondary | ICD-10-CM

## 2019-03-20 DIAGNOSIS — Z79899 Other long term (current) drug therapy: Secondary | ICD-10-CM | POA: Insufficient documentation

## 2019-03-20 DIAGNOSIS — Z794 Long term (current) use of insulin: Secondary | ICD-10-CM | POA: Insufficient documentation

## 2019-03-20 DIAGNOSIS — Z9049 Acquired absence of other specified parts of digestive tract: Secondary | ICD-10-CM | POA: Diagnosis not present

## 2019-03-20 DIAGNOSIS — I5042 Chronic combined systolic (congestive) and diastolic (congestive) heart failure: Secondary | ICD-10-CM

## 2019-03-20 DIAGNOSIS — F329 Major depressive disorder, single episode, unspecified: Secondary | ICD-10-CM | POA: Diagnosis not present

## 2019-03-20 DIAGNOSIS — I252 Old myocardial infarction: Secondary | ICD-10-CM | POA: Diagnosis not present

## 2019-03-20 DIAGNOSIS — E785 Hyperlipidemia, unspecified: Secondary | ICD-10-CM | POA: Insufficient documentation

## 2019-03-20 DIAGNOSIS — Z7901 Long term (current) use of anticoagulants: Secondary | ICD-10-CM | POA: Diagnosis not present

## 2019-03-20 DIAGNOSIS — N183 Chronic kidney disease, stage 3 unspecified: Secondary | ICD-10-CM | POA: Insufficient documentation

## 2019-03-20 DIAGNOSIS — Z7902 Long term (current) use of antithrombotics/antiplatelets: Secondary | ICD-10-CM | POA: Insufficient documentation

## 2019-03-20 DIAGNOSIS — Z951 Presence of aortocoronary bypass graft: Secondary | ICD-10-CM | POA: Diagnosis not present

## 2019-03-20 DIAGNOSIS — E1122 Type 2 diabetes mellitus with diabetic chronic kidney disease: Secondary | ICD-10-CM | POA: Diagnosis not present

## 2019-03-20 DIAGNOSIS — I2510 Atherosclerotic heart disease of native coronary artery without angina pectoris: Secondary | ICD-10-CM | POA: Diagnosis not present

## 2019-03-20 DIAGNOSIS — Z8249 Family history of ischemic heart disease and other diseases of the circulatory system: Secondary | ICD-10-CM | POA: Insufficient documentation

## 2019-03-20 DIAGNOSIS — Z87891 Personal history of nicotine dependence: Secondary | ICD-10-CM | POA: Insufficient documentation

## 2019-03-20 DIAGNOSIS — E538 Deficiency of other specified B group vitamins: Secondary | ICD-10-CM | POA: Insufficient documentation

## 2019-03-20 DIAGNOSIS — I5022 Chronic systolic (congestive) heart failure: Secondary | ICD-10-CM

## 2019-03-20 DIAGNOSIS — I255 Ischemic cardiomyopathy: Secondary | ICD-10-CM | POA: Insufficient documentation

## 2019-03-20 DIAGNOSIS — M109 Gout, unspecified: Secondary | ICD-10-CM | POA: Diagnosis not present

## 2019-03-20 DIAGNOSIS — Z955 Presence of coronary angioplasty implant and graft: Secondary | ICD-10-CM | POA: Insufficient documentation

## 2019-03-20 DIAGNOSIS — I25810 Atherosclerosis of coronary artery bypass graft(s) without angina pectoris: Secondary | ICD-10-CM | POA: Diagnosis not present

## 2019-03-20 LAB — BASIC METABOLIC PANEL
Anion gap: 10 (ref 5–15)
BUN: 23 mg/dL (ref 8–23)
CO2: 28 mmol/L (ref 22–32)
Calcium: 9 mg/dL (ref 8.9–10.3)
Chloride: 97 mmol/L — ABNORMAL LOW (ref 98–111)
Creatinine, Ser: 1.56 mg/dL — ABNORMAL HIGH (ref 0.61–1.24)
GFR calc Af Amer: 49 mL/min — ABNORMAL LOW (ref >60)
GFR calc non Af Amer: 43 mL/min — ABNORMAL LOW (ref >60)
Glucose, Bld: 198 mg/dL — ABNORMAL HIGH (ref 70–99)
Potassium: 3.9 mmol/L (ref 3.5–5.1)
Sodium: 135 mmol/L (ref 135–145)

## 2019-03-20 NOTE — Patient Instructions (Signed)
Labs done today, we will notify you for abnormal results  Your physician recommends that you schedule a follow-up appointment in: 3 months

## 2019-03-21 NOTE — Progress Notes (Signed)
Advanced Heart Failure Clinic Note   Patient ID: [name], male   DOB: 15-Jun-1940, 78 y.o.   MRN: [id number] PCP: Dr. Lynnda Child Cardiology: Dr Aundra Dubin  69 y.o. with history of CAD s/p CABG and redo CABG as well as ischemic cardiomyopathy with CRT-D device presents for followup of CHF and CAD.  He had a Cardiolite in the 4/15 showing lateral wall ischemia.  LHC at that time showed all his vein grafts from both CABG surgeries occluded.  The LIMA-LAD was patent and there was a 90% distal LM stenosis.  The lateral ischemia likely corresponded to LCx territory downstream from the LM stenosis.  PCI of the distal LM was thought to be high risk and characteristics of the lesion were not favorable for PCI.  Last echo showed EF 25-30% with moderate RV systolic dysfunction.  He had RHC in 11/15 with normal filling pressures and relatively preserved cardiac index (2.55).    In 4/17, he was admitted with chest pain concerning for unstable angina.  Angiography showed 85% distal LM with 90% ostial LCx stenosis.  LIMA was patent but proximal LAD, proximal RCA, and all SVGs were totally occluded. High had successful DES to LM and PTCA to ostial LCx with Impella support.  [name] was stopped and he was put back on low dose lisinopril due to symptomatic hypotension.  He had Cardiolite in 11/17 with scar, no ischemia.   He was admitted in 1/19 with AKI, creatinine up to 3. Meds held then restarted.   Echo 3/19 with EF 30-35%.   He was admitted with syncope and AKI in 6/19.  He was thought to be dehydrated and orthostatic. Losartan stopped but eventually restarted.   He was noted to have prolonged atrial fibrillation by device check in 3/20, now on Eliquis.   Echo was done in 6/20, showing EF 25-30% with inferior/inferolateral AK, moderate LV dilation, mild LVH, mild MR, severe LAE.    CPX in 11/20 showed moderate functional limitation due to HF.   He returns for followup of CHF and CAD.  Weight is stable.   Since cutting back on Coreg, BP has been running higher.  He still has episodes of lightheadedness with standing but not as profound and no falls.  No dyspnea walking on flat ground.  He gets short of breath with moderate exertion in his yard.  No chest pain.  No orthopnea/PND.    Medtronic device interrogation: Run of atrial fibrillation in early 11/20, fluid index < threshold with stable thoracic impedance.   Labs (9/15): LDL particle number 816, LDL 57, LFTs normal Labs (10/15): K 4.4, creatinine 1.4 Labs (11/15): K 4.3, creatinine 1.36 Labs (12/15): K 4.8, creatinine 1.34 Labs (3/16): K 4, creatinine 1.38, LDL 46, HDl 35 Labs (7/16): K 4, creatinine 1.39, BNP 211 Labs (03/03/15): K 4.1, creatinine 1.47, BNP 227, LDL 80, HDL 32 Labs (12/16): K 4.6, creatinine 1.12, LDL 68, HDL 34 Labs (4/17): K 3.4, creatinine 1.15 Labs (6/17): K 3.8, creatinine 1.67 Labs (7/17): K 4, creatinine 1.35 Labs (10/17): K 3.9, creatinine 1.31, LDL 45, HDL 30 Labs (2/18): K 4, creatinine 1.43 Labs (5/18): LDL 44, HDL 27 Labs (6/18): K 4.1, creatinine 1.49 Labs (12/18): K 4.4, creatinine 1.44 Labs (1/19): K 4.3, creatinine 1.48 Labs (2/19): K 3.7, creatinine 1.6 Labs (6/19): K 4, creatinine 1.5 Labs (9/19): K 4.4, creatinine 1.6, hgb 13.4 Labs (10/19): K 4.3, creatinine 1.44 Labs (1/20): K 4, creatinine 1.65 Labs (6/20): K 3.9, creatinine 1.58, LDL  58, HDL 31, hgb 13.3 Labs (7/20): K 3.9, creatinine 1.5 Labs (10/20): K 4, creatinine 1.7  PMH: 1. Gout 2. Hyperlipidemia 3. CAD: CABG 1997 and redo 11/12.   - LHC (4/15) with totally occluded LAD, totally occluded RCA, 80% distal LM, 50% mLCx, 2 SVG-RCA grafts totally occluded, 2 SVG-OM grafts totally occluded, patent LIMA-LAD.  Cardiolite prior to 4/15 cath showed lateral wall ischemia (LCx territory).  PCI to distal LM would be a high risk procedure.   - Cardiolite (8/16) with EF 20%, severe scar in RCA and probably LCx territory, minimal peri-infarct  ischemia.   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  - Unstable angina 4/17: 85% distal LM with 90% ostial LCx stenosis.  LIMA was patent but proximal LAD, proximal RCA, and all SVGs were totally occluded. He had successful DES to LM and PTCA to ostial LCx with Impella support  - Cardiolite (11/17): EF 20%, prior MI, no ischemia.  4. Ischemic Cardiomyopathy: Medtronic CRT-D device.   - Echo (6/15) with EF 25-30%, moderate LV dilation, inferior and inferolateral akinesis, moderately decreased RV systolic function, mild MR.   - RHC (11/15) with mean RA 5, PA 23/6, mean PCWP 9, CI 2.55.  - Echo (4/17): EF 25-30% with mild MR.  - Echo (11/17): EF 25-30%, moderately dilated LV, grade II diastolic dysfunction, mild-moderate MR, mildly decreased RV systolic function.  - Echo (3/19): EF 30-35%, inferior/inferolateral akinesis, normal RV size with mildly decreased systolic function.  - Echo (6/20): EF 25-30% with inferior/inferolateral AK, moderate LV dilation, mild LVH, mild MR, severe LAE. - CPX (11/20): VO2 max 14.9 (60% predicted), VE/VCO2 slope 34, RER 1.05.  Moderate functional deficit due to HF.  5. H/o cholecystectomy 6. OA 7. Depression 8. Type II diabetes 9. GERD 10. CKD stage 3 11. ?Early dementia 12. Atrial fibrillation: Paroxysmal.  13. B12 deficiency.   SH: Married, prior smoker (many years ago), lives in [place].    FH: CAD  ROS: All systems reviewed and negative except as per HPI.   Current Outpatient Medications  Medication Sig Dispense Refill  . allopurinol (ZYLOPRIM) 100 MG tablet Take 100 mg by mouth 2 (two) times daily.     Marland Kitchen apixaban (ELIQUIS) 5 MG TABS tablet Take 1 tablet (5 mg total) by mouth 2 (two) times daily. 60 tablet 11  . atorvastatin (LIPITOR) 80 MG tablet TAKE 1 TABLET BY MOUTH ONCE DAILY AT  6  PM 90 tablet 1  . carvedilol (COREG) 6.25 MG tablet Take 1 tablet (6.25 mg total) by mouth 2 (two) times daily. 180 tablet 1  .  Continuous Blood Gluc Sensor (FREESTYLE LIBRE 14 DAY SENSOR) MISC USE TO CHECK GLUCOSE THREE TIMES DAILY AS DIRECTED    . eplerenone (INSPRA) 50 MG tablet Take 1 tablet by mouth once daily 30 tablet 5  . furosemide (LASIX) 40 MG tablet Take 2 tablets (80 mg total) every morning and 1.5 tablets (60 mg total) every evening. 270 tablet 3  . Insulin Glargine (BASAGLAR KWIKPEN) 100 UNIT/ML SOPN Inject into the skin daily. 30 before breakfast    . insulin regular human CONCENTRATED (HUMULIN R U-500 KWIKPEN) 500 UNIT/ML kwikpen 30 units before BFST, 20 at lunch and 35 before supper, take 30-60 minutes before each meal (Patient taking differently: Inject 20-25 Units into the skin See admin instructions. 25 at lunch, 10 at super.,if snack after dinner take an additional 10u at night take 30-60 minutes before each meal) 2 pen 3  .  losartan (COZAAR) 25 MG tablet Take 0.5 tablets (12.5 mg total) by mouth daily. 45 tablet 3  . nitroGLYCERIN (NITROSTAT) 0.4 MG SL tablet DISSOLVE ONE TABLET UNDER THE TONGUE EVERY 5 MINUTES AS NEEDED FOR CHEST PAIN.  DO NOT EXCEED A TOTAL OF 3 DOSES IN 15 MINUTES 25 tablet 0  . Polyethyl Glycol-Propyl Glycol (LUBRICANT EYE DROPS) 0.4-0.3 % SOLN Place 1 drop into both eyes 3 (three) times daily as needed (for dry/irritated eyes.).    Marland Kitchen QUEtiapine (SEROQUEL) 100 MG tablet Take 100 mg by mouth at bedtime.   3  . ranolazine (RANEXA) 1000 MG SR tablet Take 500 mg by mouth 2 (two) times daily.    Marland Kitchen triamcinolone cream (KENALOG) 0.1 % Apply 1 application topically 2 (two) times daily as needed (for dry/irritated skin.).    Marland Kitchen venlafaxine (EFFEXOR-XR) 150 MG 24 hr capsule Take 150 mg by mouth daily with breakfast.     . vitamin B-12 (CYANOCOBALAMIN) 1000 MCG tablet Take 1,000 mcg by mouth daily.     No current facility-administered medications for this encounter.    BP 120/72   Pulse 80   Wt 86.6 kg (191 lb)   SpO2 97%   BMI 27.41 kg/m    Wt Readings from Last 3 Encounters:  03/20/19  86.6 kg (191 lb)  02/11/19 86.9 kg (191 lb 9.6 oz)  11/11/18 85.7 kg (189 lb)    General: NAD Neck: No JVD, no thyromegaly or thyroid nodule.  Lungs: Clear to auscultation bilaterally with normal respiratory effort. CV: Nondisplaced PMI.  Heart regular S1/S2, no S3/S4, no murmur.  No peripheral edema.  No carotid bruit.  Normal pedal pulses.  Abdomen: Soft, nontender, no hepatosplenomegaly, no distention.  Skin: Intact without lesions or rashes.  Neurologic: Alert and oriented x 3.  Psych: Normal affect. Extremities: No clubbing or cyanosis.  HEENT: Normal.   Assessment/Plan: 1. Chronic systolic CHF: Ischemic cardiomyopathy.  Echo in 6/20 showed EF 25-30%. He has a Medtronic CRT-D device.  NYHA class II-III symptoms.  CPX in 11/20 showed moderate functional limitation due to CHF.  BP is better on lower Coreg with less lightheadedness (though not totally resolved). He is not volume overloaded by exam or Optivol.  - Continue lower dose of Coreg, 6.25 mg bid.  - Continue Lasix 80 qam/60 qpm.  BMET today.     - Continue eplerenone 50 mg daily.   - BP did not tolerate Entresto.  Had ACEI cough. Continue losartan 12.5 mg daily, no BP room to titrate up.   - With orthostatic symptoms, I will not be able to titrate up his current cardiomyopathy meds.  I think that he would be a good candidate for dapagliflozin.  However, he is on insulin and has had episodes of hypoglycemia.  Therefore, I would like to coordinate this with his PCP who treats his diabetes.  He may need to decrease his insulin dosing.  2. CAD: s/p CABG and redo. Had DES to distal LM, angioplasty to ostial LCx in 4/17. Cardiolite 11/17 with scar, no ischemia.  No recent chest pain.  - Continue statin.  - He is off ASA/Plavix and taking apixaban 5 mg bid.  - Continue Coreg and ranolazine 500 mg bid.  3. CKD stage III: BMET today.   4. Hyperlipidemia: Continue statin.  Good lipids in 6/20.    5. Atrial fibrillation: Paroxysmal. Brief  atrial fibrillation episode in 11/20 on device interrogation.    - Continue apixaban.    6. CKD:  Stage 3, probably diabetic nephropathy.   Followup in 3 months.  Would like to start dapagliflozin in concert with his PCP who manages his insulin.     [name] 03/21/2019

## 2019-04-09 ENCOUNTER — Other Ambulatory Visit: Payer: Self-pay

## 2019-04-21 ENCOUNTER — Ambulatory Visit (INDEPENDENT_AMBULATORY_CARE_PROVIDER_SITE_OTHER): Payer: Medicare HMO

## 2019-04-21 DIAGNOSIS — Z95810 Presence of automatic (implantable) cardiac defibrillator: Secondary | ICD-10-CM

## 2019-04-21 DIAGNOSIS — E1121 Type 2 diabetes mellitus with diabetic nephropathy: Secondary | ICD-10-CM | POA: Diagnosis not present

## 2019-04-21 DIAGNOSIS — I5042 Chronic combined systolic (congestive) and diastolic (congestive) heart failure: Secondary | ICD-10-CM | POA: Diagnosis not present

## 2019-04-21 DIAGNOSIS — I4891 Unspecified atrial fibrillation: Secondary | ICD-10-CM | POA: Diagnosis not present

## 2019-04-21 DIAGNOSIS — E7800 Pure hypercholesterolemia, unspecified: Secondary | ICD-10-CM | POA: Diagnosis not present

## 2019-04-21 DIAGNOSIS — D519 Vitamin B12 deficiency anemia, unspecified: Secondary | ICD-10-CM | POA: Diagnosis not present

## 2019-04-21 DIAGNOSIS — I130 Hypertensive heart and chronic kidney disease with heart failure and stage 1 through stage 4 chronic kidney disease, or unspecified chronic kidney disease: Secondary | ICD-10-CM | POA: Diagnosis not present

## 2019-04-21 DIAGNOSIS — Z794 Long term (current) use of insulin: Secondary | ICD-10-CM | POA: Diagnosis not present

## 2019-04-21 DIAGNOSIS — I5022 Chronic systolic (congestive) heart failure: Secondary | ICD-10-CM | POA: Diagnosis not present

## 2019-04-25 NOTE — Progress Notes (Signed)
EPIC Encounter for ICM Monitoring  Patient Name: [name] is a 79 y.o. male Date: 04/25/2019 Primary Care Physican: Mayra Neer, MD Primary Cardiologist:[facility] Electrophysiologist: Allred Bi-V Pacing: 94.4% 04/25/2019 Weight:183lbs   Spoke with wife and patient is doing well.   OptiVolThoracicimpedance normal.  Prescribed:Furosemide40 mgtake 2 tablets (80 mg total) every morning and1.5tablets(60 mg total)every afternoon.  Labs: 03/20/2019 Creatinine 1.56, BUN 23, Potassium 3.9, Sodium 135, GFR 43-49 02/11/2019 Creatinine 1.70, BUN 26, Potassium 4.0, Sodium 139, GFR 38-44 11/11/2018 Creatinine 1.52, BUN 28, Potassium 3.9, Sodium 136, GFR 44-51 Acomplete set of results can be found in Results Review.  Recommendations: No changes and encouraged to call if experiencing any fluid symptoms.  Follow-up plan: ICM clinic phone appointment on 05/26/2019.   91 day device clinic remote transmission 05/15/2019.  Office appt 06/18/2019 with Dr. Aundra Dubin.    Copy of ICM check sent to Dr. Rayann Heman.    3 month ICM trend: 04/21/2019    1 Year ICM trend:       [name], RN 04/25/2019 12:41 PM

## 2019-04-30 DIAGNOSIS — I4891 Unspecified atrial fibrillation: Secondary | ICD-10-CM | POA: Diagnosis not present

## 2019-04-30 DIAGNOSIS — Z008 Encounter for other general examination: Secondary | ICD-10-CM | POA: Diagnosis not present

## 2019-04-30 DIAGNOSIS — R69 Illness, unspecified: Secondary | ICD-10-CM | POA: Diagnosis not present

## 2019-04-30 DIAGNOSIS — E785 Hyperlipidemia, unspecified: Secondary | ICD-10-CM | POA: Diagnosis not present

## 2019-04-30 DIAGNOSIS — Z794 Long term (current) use of insulin: Secondary | ICD-10-CM | POA: Diagnosis not present

## 2019-04-30 DIAGNOSIS — E669 Obesity, unspecified: Secondary | ICD-10-CM | POA: Diagnosis not present

## 2019-04-30 DIAGNOSIS — I509 Heart failure, unspecified: Secondary | ICD-10-CM | POA: Diagnosis not present

## 2019-04-30 DIAGNOSIS — D6869 Other thrombophilia: Secondary | ICD-10-CM | POA: Diagnosis not present

## 2019-04-30 DIAGNOSIS — E1151 Type 2 diabetes mellitus with diabetic peripheral angiopathy without gangrene: Secondary | ICD-10-CM | POA: Diagnosis not present

## 2019-04-30 DIAGNOSIS — E11620 Type 2 diabetes mellitus with diabetic dermatitis: Secondary | ICD-10-CM | POA: Diagnosis not present

## 2019-04-30 DIAGNOSIS — I110 Hypertensive heart disease with heart failure: Secondary | ICD-10-CM | POA: Diagnosis not present

## 2019-05-13 ENCOUNTER — Other Ambulatory Visit (HOSPITAL_COMMUNITY): Payer: Self-pay | Admitting: Cardiology

## 2019-05-15 ENCOUNTER — Ambulatory Visit (INDEPENDENT_AMBULATORY_CARE_PROVIDER_SITE_OTHER): Payer: Medicare HMO | Admitting: *Deleted

## 2019-05-15 DIAGNOSIS — R55 Syncope and collapse: Secondary | ICD-10-CM | POA: Diagnosis not present

## 2019-05-15 LAB — CUP PACEART REMOTE DEVICE CHECK
Battery Remaining Longevity: 83 mo
Battery Voltage: 2.98 V
Brady Statistic AP VP Percent: 4.73 %
Brady Statistic AP VS Percent: 0.02 %
Brady Statistic AS VP Percent: 94.52 %
Brady Statistic AS VS Percent: 0.73 %
Brady Statistic RA Percent Paced: 4.64 %
Brady Statistic RV Percent Paced: 95.92 %
Date Time Interrogation Session: 20210128001803
HighPow Impedance: 46 Ohm
HighPow Impedance: 56 Ohm
Implantable Lead Implant Date: 20111003
Implantable Lead Implant Date: 20111003
Implantable Lead Implant Date: 20130509
Implantable Lead Location: 753858
Implantable Lead Location: 753859
Implantable Lead Location: 753860
Implantable Lead Model: 4196
Implantable Lead Model: 5076
Implantable Lead Model: 6947
Implantable Pulse Generator Implant Date: 20191030
Lead Channel Impedance Value: 399 Ohm
Lead Channel Impedance Value: 399 Ohm
Lead Channel Impedance Value: 437 Ohm
Lead Channel Impedance Value: 513 Ohm
Lead Channel Impedance Value: 513 Ohm
Lead Channel Impedance Value: 703 Ohm
Lead Channel Pacing Threshold Amplitude: 0.625 V
Lead Channel Pacing Threshold Amplitude: 0.875 V
Lead Channel Pacing Threshold Amplitude: 2 V
Lead Channel Pacing Threshold Pulse Width: 0.4 ms
Lead Channel Pacing Threshold Pulse Width: 0.4 ms
Lead Channel Pacing Threshold Pulse Width: 0.4 ms
Lead Channel Sensing Intrinsic Amplitude: 1.375 mV
Lead Channel Sensing Intrinsic Amplitude: 1.375 mV
Lead Channel Sensing Intrinsic Amplitude: 31.625 mV
Lead Channel Sensing Intrinsic Amplitude: 31.625 mV
Lead Channel Setting Pacing Amplitude: 1.5 V
Lead Channel Setting Pacing Amplitude: 2 V
Lead Channel Setting Pacing Amplitude: 4.5 V
Lead Channel Setting Pacing Pulse Width: 0.4 ms
Lead Channel Setting Pacing Pulse Width: 0.4 ms
Lead Channel Setting Sensing Sensitivity: 0.3 mV

## 2019-05-16 NOTE — Progress Notes (Signed)
ICD Remote  

## 2019-05-26 ENCOUNTER — Ambulatory Visit (INDEPENDENT_AMBULATORY_CARE_PROVIDER_SITE_OTHER): Payer: Medicare HMO

## 2019-05-26 DIAGNOSIS — Z95810 Presence of automatic (implantable) cardiac defibrillator: Secondary | ICD-10-CM | POA: Diagnosis not present

## 2019-05-26 DIAGNOSIS — I5042 Chronic combined systolic (congestive) and diastolic (congestive) heart failure: Secondary | ICD-10-CM | POA: Diagnosis not present

## 2019-05-28 NOTE — Progress Notes (Signed)
EPIC Encounter for ICM Monitoring  Patient Name: [name] is a 79 y.o. male Date: 05/28/2019 Primary Care Physican: Mayra Neer, MD Primary Cardiologist:[facility] Electrophysiologist: Allred Bi-V Pacing: 95.7% 04/25/2019 Weight:183lbs   Spoke with wife and patient is doing well.   OptiVolThoracicimpedance normal.  Prescribed:Furosemide40 mgtake 2 tablets (80 mg total) every morning and1.5tablets(60 mg total)every afternoon.  Labs: 03/20/2019 Creatinine 1.56, BUN 23, Potassium 3.9, Sodium 135, GFR 43-49 02/11/2019 Creatinine 1.70, BUN 26, Potassium 4.0, Sodium 139, GFR 38-44 11/11/2018 Creatinine 1.52, BUN 28, Potassium 3.9, Sodium 136, GFR 44-51 Acomplete set of results can be found in Results Review.  Recommendations:No changes and encouraged to call if experiencing any fluid symptoms.  Follow-up plan: ICM clinic phone appointment on3/15/2021. 91 day device clinic remote transmission 08/14/2019. Office appt 3/3/2021with Dr. Aundra Dubin.   Copy of ICM check sent to Dr.Allred.     3 month ICM trend: 05/26/2019    1 Year ICM trend:       [name], RN 05/28/2019 4:12 PM

## 2019-06-02 DIAGNOSIS — I25119 Atherosclerotic heart disease of native coronary artery with unspecified angina pectoris: Secondary | ICD-10-CM | POA: Diagnosis not present

## 2019-06-02 DIAGNOSIS — E7800 Pure hypercholesterolemia, unspecified: Secondary | ICD-10-CM | POA: Diagnosis not present

## 2019-06-02 DIAGNOSIS — I130 Hypertensive heart and chronic kidney disease with heart failure and stage 1 through stage 4 chronic kidney disease, or unspecified chronic kidney disease: Secondary | ICD-10-CM | POA: Diagnosis not present

## 2019-06-02 DIAGNOSIS — I4891 Unspecified atrial fibrillation: Secondary | ICD-10-CM | POA: Diagnosis not present

## 2019-06-02 DIAGNOSIS — E1121 Type 2 diabetes mellitus with diabetic nephropathy: Secondary | ICD-10-CM | POA: Diagnosis not present

## 2019-06-02 DIAGNOSIS — I5022 Chronic systolic (congestive) heart failure: Secondary | ICD-10-CM | POA: Diagnosis not present

## 2019-06-02 DIAGNOSIS — Z7189 Other specified counseling: Secondary | ICD-10-CM | POA: Diagnosis not present

## 2019-06-02 DIAGNOSIS — R197 Diarrhea, unspecified: Secondary | ICD-10-CM | POA: Diagnosis not present

## 2019-06-02 DIAGNOSIS — D519 Vitamin B12 deficiency anemia, unspecified: Secondary | ICD-10-CM | POA: Diagnosis not present

## 2019-06-03 DIAGNOSIS — E1121 Type 2 diabetes mellitus with diabetic nephropathy: Secondary | ICD-10-CM | POA: Diagnosis not present

## 2019-06-03 DIAGNOSIS — E7800 Pure hypercholesterolemia, unspecified: Secondary | ICD-10-CM | POA: Diagnosis not present

## 2019-06-03 DIAGNOSIS — D519 Vitamin B12 deficiency anemia, unspecified: Secondary | ICD-10-CM | POA: Diagnosis not present

## 2019-06-14 ENCOUNTER — Other Ambulatory Visit (HOSPITAL_COMMUNITY): Payer: Self-pay | Admitting: Cardiology

## 2019-06-18 ENCOUNTER — Encounter (HOSPITAL_COMMUNITY): Payer: Self-pay | Admitting: Cardiology

## 2019-06-18 ENCOUNTER — Ambulatory Visit (HOSPITAL_COMMUNITY)
Admission: RE | Admit: 2019-06-18 | Discharge: 2019-06-18 | Disposition: A | Discharge status: Home / Self Care | Payer: Medicare HMO | Source: Ambulatory Visit | Attending: Cardiology | Admitting: Cardiology

## 2019-06-18 ENCOUNTER — Other Ambulatory Visit: Payer: Self-pay

## 2019-06-18 VITALS — BP 119/69 | HR 87 | Wt 183.5 lb

## 2019-06-18 DIAGNOSIS — Z8249 Family history of ischemic heart disease and other diseases of the circulatory system: Secondary | ICD-10-CM | POA: Diagnosis not present

## 2019-06-18 DIAGNOSIS — I252 Old myocardial infarction: Secondary | ICD-10-CM | POA: Diagnosis not present

## 2019-06-18 DIAGNOSIS — Z87891 Personal history of nicotine dependence: Secondary | ICD-10-CM | POA: Diagnosis not present

## 2019-06-18 DIAGNOSIS — Z955 Presence of coronary angioplasty implant and graft: Secondary | ICD-10-CM | POA: Insufficient documentation

## 2019-06-18 DIAGNOSIS — K219 Gastro-esophageal reflux disease without esophagitis: Secondary | ICD-10-CM | POA: Insufficient documentation

## 2019-06-18 DIAGNOSIS — Z951 Presence of aortocoronary bypass graft: Secondary | ICD-10-CM | POA: Diagnosis not present

## 2019-06-18 DIAGNOSIS — N183 Chronic kidney disease, stage 3 unspecified: Secondary | ICD-10-CM | POA: Diagnosis not present

## 2019-06-18 DIAGNOSIS — Z79899 Other long term (current) drug therapy: Secondary | ICD-10-CM | POA: Insufficient documentation

## 2019-06-18 DIAGNOSIS — Z7901 Long term (current) use of anticoagulants: Secondary | ICD-10-CM | POA: Diagnosis not present

## 2019-06-18 DIAGNOSIS — M109 Gout, unspecified: Secondary | ICD-10-CM | POA: Diagnosis not present

## 2019-06-18 DIAGNOSIS — I5022 Chronic systolic (congestive) heart failure: Secondary | ICD-10-CM | POA: Insufficient documentation

## 2019-06-18 DIAGNOSIS — Z794 Long term (current) use of insulin: Secondary | ICD-10-CM | POA: Insufficient documentation

## 2019-06-18 DIAGNOSIS — I255 Ischemic cardiomyopathy: Secondary | ICD-10-CM | POA: Insufficient documentation

## 2019-06-18 DIAGNOSIS — E785 Hyperlipidemia, unspecified: Secondary | ICD-10-CM | POA: Diagnosis not present

## 2019-06-18 DIAGNOSIS — F329 Major depressive disorder, single episode, unspecified: Secondary | ICD-10-CM | POA: Insufficient documentation

## 2019-06-18 DIAGNOSIS — I5042 Chronic combined systolic (congestive) and diastolic (congestive) heart failure: Secondary | ICD-10-CM

## 2019-06-18 DIAGNOSIS — I2510 Atherosclerotic heart disease of native coronary artery without angina pectoris: Secondary | ICD-10-CM | POA: Insufficient documentation

## 2019-06-18 DIAGNOSIS — I480 Paroxysmal atrial fibrillation: Secondary | ICD-10-CM | POA: Diagnosis not present

## 2019-06-18 DIAGNOSIS — E1122 Type 2 diabetes mellitus with diabetic chronic kidney disease: Secondary | ICD-10-CM | POA: Insufficient documentation

## 2019-06-18 DIAGNOSIS — R69 Illness, unspecified: Secondary | ICD-10-CM | POA: Diagnosis not present

## 2019-06-18 DIAGNOSIS — Z9049 Acquired absence of other specified parts of digestive tract: Secondary | ICD-10-CM | POA: Diagnosis not present

## 2019-06-18 LAB — BASIC METABOLIC PANEL
Anion gap: 12 (ref 5–15)
BUN: 26 mg/dL — ABNORMAL HIGH (ref 8–23)
CO2: 26 mmol/L (ref 22–32)
Calcium: 9.1 mg/dL (ref 8.9–10.3)
Chloride: 99 mmol/L (ref 98–111)
Creatinine, Ser: 1.63 mg/dL — ABNORMAL HIGH (ref 0.61–1.24)
GFR calc Af Amer: 47 mL/min — ABNORMAL LOW (ref >60)
GFR calc non Af Amer: 40 mL/min — ABNORMAL LOW (ref >60)
Glucose, Bld: 177 mg/dL — ABNORMAL HIGH (ref 70–99)
Potassium: 3.8 mmol/L (ref 3.5–5.1)
Sodium: 137 mmol/L (ref 135–145)

## 2019-06-18 MED ORDER — LOSARTAN POTASSIUM 25 MG PO TABS: 12.5000 mg | ORAL_TABLET | Freq: Every day | ORAL | 3 refills | Status: DC

## 2019-06-18 MED ORDER — EPLERENONE 50 MG PO TABS: 50.0000 mg | ORAL_TABLET | Freq: Every day | ORAL | 5 refills | Status: DC

## 2019-06-18 NOTE — Patient Instructions (Signed)
CHANGE the times in which you take Eplerenone and Losartan.  Take both medications at night before bed.   Labs today We will only contact you if something comes back abnormal or we need to make some changes. Otherwise no news is good news!   Your physician recommends that you schedule a follow-up appointment in: 3 months with Dr Aundra Dubin.    Please call office at [phone] option 2 if you have any questions or concerns.    At the [facility], you and your health needs are our priority. As part of our continuing mission to provide you with exceptional heart care, we have created designated Provider Care Teams. These Care Teams include your primary Cardiologist (physician) and Advanced Practice Providers (APPs- Physician Assistants and Nurse Practitioners) who all work together to provide you with the care you need, when you need it.   You may see any of the following providers on your designated Care Team at your next follow up: Marland Kitchen Dr Glori Bickers . Dr [name] . Darrick Grinder, NP . Lyda Jester, PA . Audry Riles, PharmD   Please be sure to bring in all your medications bottles to every appointment.

## 2019-06-18 NOTE — Progress Notes (Signed)
Advanced Heart Failure Clinic Note   Patient ID: [name], male   DOB: 24-Aug-1940, 79 y.o.   MRN: [id number] PCP: Dr. Lynnda Child Cardiology: Dr Aundra Dubin  15 y.o. with history of CAD s/p CABG and redo CABG as well as ischemic cardiomyopathy with CRT-D device presents for followup of CHF and CAD.  He had a Cardiolite in the 4/15 showing lateral wall ischemia.  LHC at that time showed all his vein grafts from both CABG surgeries occluded.  The LIMA-LAD was patent and there was a 90% distal LM stenosis.  The lateral ischemia likely corresponded to LCx territory downstream from the LM stenosis.  PCI of the distal LM was thought to be high risk and characteristics of the lesion were not favorable for PCI.  Last echo showed EF 25-30% with moderate RV systolic dysfunction.  He had RHC in 11/15 with normal filling pressures and relatively preserved cardiac index (2.55).    In 4/17, he was admitted with chest pain concerning for unstable angina.  Angiography showed 85% distal LM with 90% ostial LCx stenosis.  LIMA was patent but proximal LAD, proximal RCA, and all SVGs were totally occluded. High had successful DES to LM and PTCA to ostial LCx with Impella support.  [name] was stopped and he was put back on low dose lisinopril due to symptomatic hypotension.  He had Cardiolite in 11/17 with scar, no ischemia.   He was admitted in 1/19 with AKI, creatinine up to 3. Meds held then restarted.   Echo 3/19 with EF 30-35%.   He was admitted with syncope and AKI in 6/19.  He was thought to be dehydrated and orthostatic. Losartan stopped but eventually restarted.   He was noted to have prolonged atrial fibrillation by device check in 3/20, now on Eliquis.   Echo was done in 6/20, showing EF 25-30% with inferior/inferolateral AK, moderate LV dilation, mild LVH, mild MR, severe LAE.    CPX in 11/20 showed moderate functional limitation due to HF.   He returns for followup of CHF and CAD.  Weight is down 8 lbs.   Occasional episodes of mild atypical chest pain. No dyspnea walking on flat ground.  No orthopnea/PND.  Still with episodes of lightheadedness, he had a fall about 6 wks ago in a store.   Medtronic device interrogation: couple of short AF bursts, no VT, stable thoracic impedance.   Labs (9/15): LDL particle number 816, LDL 57, LFTs normal Labs (10/15): K 4.4, creatinine 1.4 Labs (11/15): K 4.3, creatinine 1.36 Labs (12/15): K 4.8, creatinine 1.34 Labs (3/16): K 4, creatinine 1.38, LDL 46, HDl 35 Labs (7/16): K 4, creatinine 1.39, BNP 211 Labs (03/03/15): K 4.1, creatinine 1.47, BNP 227, LDL 80, HDL 32 Labs (12/16): K 4.6, creatinine 1.12, LDL 68, HDL 34 Labs (4/17): K 3.4, creatinine 1.15 Labs (6/17): K 3.8, creatinine 1.67 Labs (7/17): K 4, creatinine 1.35 Labs (10/17): K 3.9, creatinine 1.31, LDL 45, HDL 30 Labs (2/18): K 4, creatinine 1.43 Labs (5/18): LDL 44, HDL 27 Labs (6/18): K 4.1, creatinine 1.49 Labs (12/18): K 4.4, creatinine 1.44 Labs (1/19): K 4.3, creatinine 1.48 Labs (2/19): K 3.7, creatinine 1.6 Labs (6/19): K 4, creatinine 1.5 Labs (9/19): K 4.4, creatinine 1.6, hgb 13.4 Labs (10/19): K 4.3, creatinine 1.44 Labs (1/20): K 4, creatinine 1.65 Labs (6/20): K 3.9, creatinine 1.58, LDL 58, HDL 31, hgb 13.3 Labs (7/20): K 3.9, creatinine 1.5 Labs (10/20): K 4, creatinine 1.7 Labs (12/20): K 3.9, creatinine 1.56  PMH: 1. Gout 2. Hyperlipidemia 3. CAD: CABG 1997 and redo 11/12.   - LHC (4/15) with totally occluded LAD, totally occluded RCA, 80% distal LM, 50% mLCx, 2 SVG-RCA grafts totally occluded, 2 SVG-OM grafts totally occluded, patent LIMA-LAD.  Cardiolite prior to 4/15 cath showed lateral wall ischemia (LCx territory).  PCI to distal LM would be a high risk procedure.   - Cardiolite (8/16) with EF 20%, severe scar in RCA and probably LCx territory, minimal peri-infarct ischemia.   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  -  Unstable angina 4/17: 85% distal LM with 90% ostial LCx stenosis.  LIMA was patent but proximal LAD, proximal RCA, and all SVGs were totally occluded. He had successful DES to LM and PTCA to ostial LCx with Impella support  - Cardiolite (11/17): EF 20%, prior MI, no ischemia.  4. Ischemic Cardiomyopathy: Medtronic CRT-D device.   - Echo (6/15) with EF 25-30%, moderate LV dilation, inferior and inferolateral akinesis, moderately decreased RV systolic function, mild MR.   - RHC (11/15) with mean RA 5, PA 23/6, mean PCWP 9, CI 2.55.  - Echo (4/17): EF 25-30% with mild MR.  - Echo (11/17): EF 25-30%, moderately dilated LV, grade II diastolic dysfunction, mild-moderate MR, mildly decreased RV systolic function.  - Echo (3/19): EF 30-35%, inferior/inferolateral akinesis, normal RV size with mildly decreased systolic function.  - Echo (6/20): EF 25-30% with inferior/inferolateral AK, moderate LV dilation, mild LVH, mild MR, severe LAE. - CPX (11/20): VO2 max 14.9 (60% predicted), VE/VCO2 slope 34, RER 1.05.  Moderate functional deficit due to HF.  5. H/o cholecystectomy 6. OA 7. Depression 8. Type II diabetes 9. GERD 10. CKD stage 3 11. ?Early dementia 12. Atrial fibrillation: Paroxysmal.  13. B12 deficiency.   SH: Married, prior smoker (many years ago), lives in [place].    FH: CAD  ROS: All systems reviewed and negative except as per HPI.   Current Outpatient Medications  Medication Sig Dispense Refill  . allopurinol (ZYLOPRIM) 100 MG tablet Take 100 mg by mouth 2 (two) times daily.     Marland Kitchen apixaban (ELIQUIS) 5 MG TABS tablet Take 1 tablet (5 mg total) by mouth 2 (two) times daily. 60 tablet 11  . atorvastatin (LIPITOR) 80 MG tablet TAKE 1 TABLET BY MOUTH ONCE DAILY AT 6PM IN THE EVENING 90 tablet 0  . carvedilol (COREG) 6.25 MG tablet Take 1 tablet (6.25 mg total) by mouth 2 (two) times daily. 180 tablet 1  . Continuous Blood Gluc Sensor (FREESTYLE LIBRE 14 DAY SENSOR) MISC USE TO CHECK  GLUCOSE THREE TIMES DAILY AS DIRECTED    . eplerenone (INSPRA) 50 MG tablet Take 1 tablet (50 mg total) by mouth at bedtime. 30 tablet 5  . furosemide (LASIX) 40 MG tablet Take 2 tablets (80 mg total) every morning and 1.5 tablets (60 mg total) every evening. 270 tablet 3  . Insulin Isophane & Regular Human (NOVOLIN 70/30 FLEXPEN) (70-30) 100 UNIT/ML PEN Inject into the skin.    Marland Kitchen losartan (COZAAR) 25 MG tablet Take 0.5 tablets (12.5 mg total) by mouth at bedtime. 45 tablet 3  . nitroGLYCERIN (NITROSTAT) 0.4 MG SL tablet DISSOLVE ONE TABLET UNDER THE TONGUE EVERY 5 MINUTES AS NEEDED FOR CHEST PAIN.  DO NOT EXCEED A TOTAL OF 3 DOSES IN 15 MINUTES 25 tablet 0  . Polyethyl Glycol-Propyl Glycol (LUBRICANT EYE DROPS) 0.4-0.3 % SOLN Place 1 drop into both eyes 3 (three) times daily as needed (  for dry/irritated eyes.).    Marland Kitchen QUEtiapine (SEROQUEL) 100 MG tablet Take 100 mg by mouth at bedtime.   3  . ranolazine (RANEXA) 1000 MG SR tablet Take 500 mg by mouth 2 (two) times daily.    Marland Kitchen triamcinolone cream (KENALOG) 0.1 % Apply 1 application topically 2 (two) times daily as needed (for dry/irritated skin.).    Marland Kitchen venlafaxine (EFFEXOR-XR) 150 MG 24 hr capsule Take 150 mg by mouth daily with breakfast.     . vitamin B-12 (CYANOCOBALAMIN) 1000 MCG tablet Take 1,000 mcg by mouth daily.     No current facility-administered medications for this encounter.   BP 119/69   Pulse 87   Wt 83.2 kg (183 lb 8 oz)   SpO2 100%   BMI 26.33 kg/m    Wt Readings from Last 3 Encounters:  06/18/19 83.2 kg (183 lb 8 oz)  03/20/19 86.6 kg (191 lb)  02/11/19 86.9 kg (191 lb 9.6 oz)    General: NAD Neck: No JVD, no thyromegaly or thyroid nodule.  Lungs: Clear to auscultation bilaterally with normal respiratory effort. CV: Nondisplaced PMI.  Heart regular S1/S2, no S3/S4, no murmur.  No peripheral edema.  No carotid bruit.  Normal pedal pulses.  Abdomen: Soft, nontender, no hepatosplenomegaly, no distention.  Skin: Intact  without lesions or rashes.  Neurologic: Alert and oriented x 3.  Psych: Normal affect. Extremities: No clubbing or cyanosis.  HEENT: Normal.   Assessment/Plan: 1. Chronic systolic CHF: Ischemic cardiomyopathy.  Echo in 6/20 showed EF 25-30%. He has a Medtronic CRT-D device.  NYHA class II-III symptoms.  CPX in 11/20 showed moderate functional limitation due to CHF.  He is not volume overloaded by exam or Optivol. Still with occasional lightheaded spells.  - Continue lower dose of Coreg, 6.25 mg bid.  - Continue Lasix 80 qam/60 qpm.  BMET today.     - Continue eplerenone 50 mg daily but move to evening to limit effect on BP during the day.   - BP did not tolerate Entresto.  Had ACEI cough. Continue losartan 12.5 mg daily, move to evening dosing to limit effect on BP during the day.   - With orthostatic symptoms, I will not be able to titrate up his current cardiomyopathy meds.  I think that he would be a good candidate for dapagliflozin.  However, he is on insulin and has had episodes of hypoglycemia.  Therefore, I would like to coordinate this with his PCP who treats his diabetes.  He may need to decrease his insulin dosing.  2. CAD: s/p CABG and redo. Had DES to distal LM, angioplasty to ostial LCx in 4/17. Cardiolite 11/17 with scar, no ischemia.  Rare atypical chest pain.  - Continue statin.  - He is off ASA/Plavix and taking apixaban 5 mg bid.  - Continue Coreg and ranolazine 500 mg bid.  3. CKD stage III: BMET today.   4. Hyperlipidemia: Continue statin.  Good lipids in 6/20.    5. Atrial fibrillation: Paroxysmal. Brief short AF episodes on device interrogation.    - Continue apixaban.    6. CKD: Stage 3, probably diabetic nephropathy.   [name] 06/18/2019

## 2019-06-30 ENCOUNTER — Ambulatory Visit (INDEPENDENT_AMBULATORY_CARE_PROVIDER_SITE_OTHER): Payer: Medicare HMO

## 2019-06-30 DIAGNOSIS — I5042 Chronic combined systolic (congestive) and diastolic (congestive) heart failure: Secondary | ICD-10-CM

## 2019-06-30 DIAGNOSIS — Z95810 Presence of automatic (implantable) cardiac defibrillator: Secondary | ICD-10-CM

## 2019-07-04 NOTE — Progress Notes (Signed)
EPIC Encounter for ICM Monitoring  Patient Name: [name] is a 79 y.o. male Date: 07/04/2019 Primary Care Physican: Mayra Neer, MD Primary Cardiologist:[place] Electrophysiologist: Allred Bi-V Pacing: 96.9% 3/19/2021Weight:185.2lbs   Spoke with wife and patient had fluid symptoms during decreased impedance when they were at the beach.  Slight leg swelling at times.  Patient normally eats restaurant foods for lunch and small meal at night like frozen chicken wings.  OptiVolThoracicimpedance normal.  Prescribed:Furosemide40 mgtake 2 tablets (80 mg total) every morning and1.5tablets(60 mg total)every afternoon.  Labs: 06/18/2019 Creatinine 1.63, BUN 26, Potassium 3.8, Sodium 137, GFR 40-47 03/20/2019 Creatinine 1.56, BUN 23, Potassium 3.9, Sodium 135, GFR 43-49 02/11/2019 Creatinine 1.70, BUN 26, Potassium 4.0, Sodium 139, GFR 38-44 11/11/2018 Creatinine 1.52, BUN 28, Potassium 3.9, Sodium 136, GFR 44-51 Acomplete set of results can be found in Results Review.  Recommendations: Advised to avoid restaurant foods and review salt content on food labels at him.  Explained restaurant foods are typically high in salt.  No changes and encouraged to call if experiencing any fluid symptoms.  Follow-up plan: ICM clinic phone appointment on4/19/2021. 91 day device clinic remote transmission 08/14/2019.   Copy of ICM check sent to Dr.Allred.   3 month ICM trend: 06/30/2019    1 Year ICM trend:       [name], RN 07/04/2019 12:53 PM

## 2019-08-01 ENCOUNTER — Other Ambulatory Visit: Payer: Self-pay | Admitting: Cardiology

## 2019-08-03 ENCOUNTER — Other Ambulatory Visit: Payer: Self-pay | Admitting: Internal Medicine

## 2019-08-03 DIAGNOSIS — I480 Paroxysmal atrial fibrillation: Secondary | ICD-10-CM

## 2019-08-04 ENCOUNTER — Ambulatory Visit (INDEPENDENT_AMBULATORY_CARE_PROVIDER_SITE_OTHER): Payer: Medicare HMO

## 2019-08-04 DIAGNOSIS — Z95810 Presence of automatic (implantable) cardiac defibrillator: Secondary | ICD-10-CM | POA: Diagnosis not present

## 2019-08-04 DIAGNOSIS — I5042 Chronic combined systolic (congestive) and diastolic (congestive) heart failure: Secondary | ICD-10-CM

## 2019-08-04 NOTE — Telephone Encounter (Signed)
Prescription refill request for Eliquis received.  Last office visit: [place], 06/18/2019 Scr: 1.63, 06/18/2019 Age: 79 y.o. Weight: 83.2 kg   Prescription refill sent.

## 2019-08-05 ENCOUNTER — Other Ambulatory Visit: Payer: Self-pay | Admitting: Cardiology

## 2019-08-05 NOTE — Progress Notes (Signed)
EPIC Encounter for ICM Monitoring  Patient Name: [name] is a 79 y.o. male Date: 08/05/2019 Primary Care Physican: Mayra Neer, MD Primary [place] Electrophysiologist: Allred Bi-V Pacing: 97.1% 3/19/2021Weight:185.2lbs  Clinical Status (30-Jun-2019 to 01-Aug-2019)   AT/AF              7 episodes    Time in AT/AF  0.3 hr/day (1.1%)   Longest AT/AF  19 hours   Clinical Status (01-Aug-2019 to 04-Aug-2019)  AT/AF              1 Episode  Time in AT/AF  8.1 hr/day (33.8%)  Longest AT/AF  19 hours   Spoke with wife and reports patient feeling well at this time.  Denies fluid symptoms.    OptiVolThoracicimpedance normal.  Prescribed:Furosemide40 mgtake 2 tablets (80 mg total) every morning and1.5tablets(60 mg total)every afternoon.  Labs: 06/18/2019 Creatinine 1.63, BUN 26, Potassium 3.8, Sodium 137, GFR 40-47 03/20/2019 Creatinine 1.56, BUN 23, Potassium 3.9, Sodium 135, GFR 43-49 02/11/2019 Creatinine 1.70, BUN 26, Potassium 4.0, Sodium 139, GFR 38-44 11/11/2018 Creatinine 1.52, BUN 28, Potassium 3.9, Sodium 136, GFR 44-51 Acomplete set of results can be found in Results Review.  Recommendations: No changes and encouraged to call if experiencing any fluid symptoms.  Follow-up plan: ICM clinic phone appointment on5/24/2021. 91 day device clinic remote transmission4/29/2021. Office visit with Dr Aundra Dubin on 09/18/2019.  Copy of ICM check sent to Dr.Allred.  3 month ICM trend: 08/04/2019     AT/AF    1 Year ICM trend:       [name], RN 08/05/2019 12:26 PM

## 2019-08-13 ENCOUNTER — Other Ambulatory Visit (HOSPITAL_COMMUNITY): Payer: Self-pay | Admitting: Cardiology

## 2019-08-14 ENCOUNTER — Ambulatory Visit (INDEPENDENT_AMBULATORY_CARE_PROVIDER_SITE_OTHER): Payer: Medicare HMO | Admitting: *Deleted

## 2019-08-14 DIAGNOSIS — R55 Syncope and collapse: Secondary | ICD-10-CM | POA: Diagnosis not present

## 2019-08-14 LAB — CUP PACEART REMOTE DEVICE CHECK
Battery Remaining Longevity: 74 mo
Battery Voltage: 2.97 V
Brady Statistic AP VP Percent: 17.52 %
Brady Statistic AP VS Percent: 0.08 %
Brady Statistic AS VP Percent: 82.05 %
Brady Statistic AS VS Percent: 0.35 %
Brady Statistic RA Percent Paced: 16.54 %
Brady Statistic RV Percent Paced: 97.49 %
Date Time Interrogation Session: 20210429001804
HighPow Impedance: 45 Ohm
HighPow Impedance: 56 Ohm
Implantable Lead Implant Date: 20111003
Implantable Lead Implant Date: 20111003
Implantable Lead Implant Date: 20130509
Implantable Lead Location: 753858
Implantable Lead Location: 753859
Implantable Lead Location: 753860
Implantable Lead Model: 4196
Implantable Lead Model: 5076
Implantable Lead Model: 6947
Implantable Pulse Generator Implant Date: 20191030
Lead Channel Impedance Value: 399 Ohm
Lead Channel Impedance Value: 399 Ohm
Lead Channel Impedance Value: 399 Ohm
Lead Channel Impedance Value: 513 Ohm
Lead Channel Impedance Value: 551 Ohm
Lead Channel Impedance Value: 703 Ohm
Lead Channel Pacing Threshold Amplitude: 0.625 V
Lead Channel Pacing Threshold Amplitude: 0.875 V
Lead Channel Pacing Threshold Amplitude: 2.125 V
Lead Channel Pacing Threshold Pulse Width: 0.4 ms
Lead Channel Pacing Threshold Pulse Width: 0.4 ms
Lead Channel Pacing Threshold Pulse Width: 0.4 ms
Lead Channel Sensing Intrinsic Amplitude: 1.5 mV
Lead Channel Sensing Intrinsic Amplitude: 1.5 mV
Lead Channel Sensing Intrinsic Amplitude: 31.625 mV
Lead Channel Sensing Intrinsic Amplitude: 31.625 mV
Lead Channel Setting Pacing Amplitude: 1.5 V
Lead Channel Setting Pacing Amplitude: 2 V
Lead Channel Setting Pacing Amplitude: 4.5 V
Lead Channel Setting Pacing Pulse Width: 0.4 ms
Lead Channel Setting Pacing Pulse Width: 0.4 ms
Lead Channel Setting Sensing Sensitivity: 0.3 mV

## 2019-08-15 NOTE — Progress Notes (Signed)
ICD Remote  

## 2019-09-08 ENCOUNTER — Ambulatory Visit (INDEPENDENT_AMBULATORY_CARE_PROVIDER_SITE_OTHER): Payer: Medicare HMO

## 2019-09-08 DIAGNOSIS — E7800 Pure hypercholesterolemia, unspecified: Secondary | ICD-10-CM | POA: Diagnosis not present

## 2019-09-08 DIAGNOSIS — R197 Diarrhea, unspecified: Secondary | ICD-10-CM | POA: Diagnosis not present

## 2019-09-08 DIAGNOSIS — I4891 Unspecified atrial fibrillation: Secondary | ICD-10-CM | POA: Diagnosis not present

## 2019-09-08 DIAGNOSIS — I130 Hypertensive heart and chronic kidney disease with heart failure and stage 1 through stage 4 chronic kidney disease, or unspecified chronic kidney disease: Secondary | ICD-10-CM | POA: Diagnosis not present

## 2019-09-08 DIAGNOSIS — E1121 Type 2 diabetes mellitus with diabetic nephropathy: Secondary | ICD-10-CM | POA: Diagnosis not present

## 2019-09-08 DIAGNOSIS — Z95810 Presence of automatic (implantable) cardiac defibrillator: Secondary | ICD-10-CM

## 2019-09-08 DIAGNOSIS — I5042 Chronic combined systolic (congestive) and diastolic (congestive) heart failure: Secondary | ICD-10-CM

## 2019-09-08 DIAGNOSIS — I25119 Atherosclerotic heart disease of native coronary artery with unspecified angina pectoris: Secondary | ICD-10-CM | POA: Diagnosis not present

## 2019-09-08 DIAGNOSIS — I5022 Chronic systolic (congestive) heart failure: Secondary | ICD-10-CM | POA: Diagnosis not present

## 2019-09-09 ENCOUNTER — Other Ambulatory Visit: Payer: Self-pay | Admitting: Cardiology

## 2019-09-10 ENCOUNTER — Telehealth: Payer: Self-pay | Admitting: Internal Medicine

## 2019-09-10 NOTE — Telephone Encounter (Signed)
CareAlert received for atrial flutter.  Pt in atrial flutter since 5/23 with controlled V rates. Spoke with patient. He is feeling "about the same".  No worsening HF symptoms. He has an appt with Dr Aundra Dubin next week.  Advised to call if symptoms worsen between now and then.  If still in flutter at that visit, could consider attempted pace termination of flutter vs DCCV  Chanetta Marshall, NP 09/10/2019 8:09 AM

## 2019-09-10 NOTE — Progress Notes (Signed)
EPIC Encounter for ICM Monitoring  Patient Name: [name] is a 79 y.o. male Date: 09/10/2019 Primary Care Physican: Mayra Neer, MD Primary Cardiologist:[place] Electrophysiologist: Allred Bi-V Pacing: 97.1% 3/19/2021Weight:185.2lbs 09/10/2019 Weight: 180 lbs  Clinical Status (08-Sep-2019 to 10-Sep-2019)  AT/AF              1 episode   Time in AT/AF 24.0 hr/day (100.0%)  Cell [phone]     Spoke with wife and reports patient feeling well at this time.  Denies fluid symptoms.  Patient had food poisoning from raw oysters earlier this month.       Patient notified 09/10/19 by Chanetta Marshall, NP that he has been in [place] since 09/07/19.    OptiVolThoracicimpedance slightly below baseline normal.  Prescribed:Furosemide40 mgtake 2 tablets (80 mg total) every morning and1.5tablets(60 mg total)every afternoon.  Labs: 09/08/2019 Creatinine 1.71, BUN 27, Potassium 4.2, Sodium 139, GFR 39-47  06/18/2019 Creatinine 1.63, BUN 26, Potassium 3.8, Sodium 137, GFR 40-47 03/20/2019 Creatinine 1.56, BUN 23, Potassium 3.9, Sodium 135, GFR 43-49 02/11/2019 Creatinine 1.70, BUN 26, Potassium 4.0, Sodium 139, GFR 38-44 11/11/2018 Creatinine 1.52, BUN 28, Potassium 3.9, Sodium 136, GFR 44-51 Acomplete set of results can be found in Results Review.  Recommendations: No changes and encouraged to call if experiencing any fluid symptoms.  Follow-up plan: ICM clinic phone appointment on6/28/2021 (transmission will be checked by Dr Aundra Dubin on 6/3). 91 day device clinic remote transmission7/29/2021. Office visit with Dr Aundra Dubin on 09/18/2019.  Copy of ICM check sent to Dr.Allred.  3 month ICM trend: 09/10/2019    1 Year ICM trend:       [name], RN 09/10/2019 1:16 PM

## 2019-09-18 ENCOUNTER — Other Ambulatory Visit (HOSPITAL_COMMUNITY): Payer: Self-pay

## 2019-09-18 ENCOUNTER — Ambulatory Visit (HOSPITAL_COMMUNITY)
Admission: RE | Admit: 2019-09-18 | Discharge: 2019-09-18 | Disposition: A | Discharge status: Home / Self Care | Payer: Medicare HMO | Source: Ambulatory Visit | Attending: Cardiology | Admitting: Cardiology

## 2019-09-18 ENCOUNTER — Encounter (HOSPITAL_COMMUNITY): Payer: Self-pay | Admitting: Cardiology

## 2019-09-18 ENCOUNTER — Other Ambulatory Visit: Payer: Self-pay

## 2019-09-18 VITALS — BP 112/56 | HR 70 | Wt 182.2 lb

## 2019-09-18 DIAGNOSIS — Z794 Long term (current) use of insulin: Secondary | ICD-10-CM | POA: Diagnosis not present

## 2019-09-18 DIAGNOSIS — Z955 Presence of coronary angioplasty implant and graft: Secondary | ICD-10-CM | POA: Diagnosis not present

## 2019-09-18 DIAGNOSIS — Z8249 Family history of ischemic heart disease and other diseases of the circulatory system: Secondary | ICD-10-CM | POA: Diagnosis not present

## 2019-09-18 DIAGNOSIS — F329 Major depressive disorder, single episode, unspecified: Secondary | ICD-10-CM | POA: Insufficient documentation

## 2019-09-18 DIAGNOSIS — I5022 Chronic systolic (congestive) heart failure: Secondary | ICD-10-CM | POA: Diagnosis not present

## 2019-09-18 DIAGNOSIS — I2510 Atherosclerotic heart disease of native coronary artery without angina pectoris: Secondary | ICD-10-CM

## 2019-09-18 DIAGNOSIS — I255 Ischemic cardiomyopathy: Secondary | ICD-10-CM | POA: Diagnosis not present

## 2019-09-18 DIAGNOSIS — Z951 Presence of aortocoronary bypass graft: Secondary | ICD-10-CM | POA: Insufficient documentation

## 2019-09-18 DIAGNOSIS — Z79899 Other long term (current) drug therapy: Secondary | ICD-10-CM | POA: Insufficient documentation

## 2019-09-18 DIAGNOSIS — Z87891 Personal history of nicotine dependence: Secondary | ICD-10-CM | POA: Diagnosis not present

## 2019-09-18 DIAGNOSIS — M1990 Unspecified osteoarthritis, unspecified site: Secondary | ICD-10-CM | POA: Diagnosis not present

## 2019-09-18 DIAGNOSIS — R0789 Other chest pain: Secondary | ICD-10-CM | POA: Diagnosis not present

## 2019-09-18 DIAGNOSIS — Z7901 Long term (current) use of anticoagulants: Secondary | ICD-10-CM | POA: Diagnosis not present

## 2019-09-18 DIAGNOSIS — N183 Chronic kidney disease, stage 3 unspecified: Secondary | ICD-10-CM | POA: Diagnosis not present

## 2019-09-18 DIAGNOSIS — I5042 Chronic combined systolic (congestive) and diastolic (congestive) heart failure: Secondary | ICD-10-CM | POA: Diagnosis not present

## 2019-09-18 DIAGNOSIS — K219 Gastro-esophageal reflux disease without esophagitis: Secondary | ICD-10-CM | POA: Insufficient documentation

## 2019-09-18 DIAGNOSIS — Z9049 Acquired absence of other specified parts of digestive tract: Secondary | ICD-10-CM | POA: Diagnosis not present

## 2019-09-18 DIAGNOSIS — M109 Gout, unspecified: Secondary | ICD-10-CM | POA: Diagnosis not present

## 2019-09-18 DIAGNOSIS — E785 Hyperlipidemia, unspecified: Secondary | ICD-10-CM | POA: Insufficient documentation

## 2019-09-18 DIAGNOSIS — E1122 Type 2 diabetes mellitus with diabetic chronic kidney disease: Secondary | ICD-10-CM | POA: Diagnosis not present

## 2019-09-18 DIAGNOSIS — I4819 Other persistent atrial fibrillation: Secondary | ICD-10-CM | POA: Insufficient documentation

## 2019-09-18 DIAGNOSIS — I25110 Atherosclerotic heart disease of native coronary artery with unstable angina pectoris: Secondary | ICD-10-CM | POA: Diagnosis not present

## 2019-09-18 DIAGNOSIS — I252 Old myocardial infarction: Secondary | ICD-10-CM | POA: Insufficient documentation

## 2019-09-18 LAB — BASIC METABOLIC PANEL
Anion gap: 11 (ref 5–15)
BUN: 39 mg/dL — ABNORMAL HIGH (ref 8–23)
CO2: 26 mmol/L (ref 22–32)
Calcium: 8.8 mg/dL — ABNORMAL LOW (ref 8.9–10.3)
Chloride: 99 mmol/L (ref 98–111)
Creatinine, Ser: 1.99 mg/dL — ABNORMAL HIGH (ref 0.61–1.24)
GFR calc Af Amer: 36 mL/min — ABNORMAL LOW (ref >60)
GFR calc non Af Amer: 31 mL/min — ABNORMAL LOW (ref >60)
Glucose, Bld: 83 mg/dL (ref 70–99)
Potassium: 3.9 mmol/L (ref 3.5–5.1)
Sodium: 136 mmol/L (ref 135–145)

## 2019-09-18 LAB — CBC
HCT: 43.3 % (ref 39.0–52.0)
Hemoglobin: 13.7 g/dL (ref 13.0–17.0)
MCH: 29.9 pg (ref 26.0–34.0)
MCHC: 31.6 g/dL (ref 30.0–36.0)
MCV: 94.5 fL (ref 80.0–100.0)
Platelets: 289 10*3/uL (ref 150–400)
RBC: 4.58 MIL/uL (ref 4.22–5.81)
RDW: 15.2 % (ref 11.5–15.5)
WBC: 7.5 10*3/uL (ref 4.0–10.5)
nRBC: 0 % (ref 0.0–0.2)

## 2019-09-18 LAB — LIPID PANEL
Cholesterol: 104 mg/dL (ref 0–200)
HDL: 36 mg/dL — ABNORMAL LOW (ref >40)
LDL Cholesterol: 51 mg/dL (ref 0–99)
Total CHOL/HDL Ratio: 2.9 RATIO
Triglycerides: 85 mg/dL (ref <150)
VLDL: 17 mg/dL (ref 0–40)

## 2019-09-18 NOTE — Patient Instructions (Addendum)
Labs done today. We will contact you only if your labs are abnormal.   No medication changes were made. Please continue all current medications as prescribed.   Your physician recommends that you schedule a follow-up appointment in: 2 weeks after cath procedure    Dear [name] are scheduled for a TEE Cardioversion on Friday June 11th, 2021 with Dr. Aundra Dubin.  Please arrive at the [facility] (Main Entrance A) at [facility]: [address] [place], [place] [address] at 10:30 am   DIET: Nothing to eat or drink after midnight except a sip of water with medications (see medication instructions below)  Medication Instructions: Hold Insulin and Farxiga on the morning of your procedure.   Hold Lasix on the morning of your procedure  Continue your anticoagulant: Eliquis You will need to continue your anticoagulant after your procedure until you  are told by your  Provider that it is safe to stop   Labs: done today in office  You will need a pre procedure COVID test    WHEN:  Wednesday June 9th anytime between 9am-3pm WHERE: [facility]  [place] [address]  This is a drive thru testing site, you will remain in your car. Be sure to get in the line FOR PROCEDURES Once you have been swabbed you will need to remain home in quarantine until you return for your procedure.  You must have a responsible person to drive you home and stay in the waiting area during your procedure. Failure to do so could result in cancellation.  Bring your insurance cards.  *Special Note: Every effort is made to have your procedure done on time. Occasionally there are emergencies that occur at the hospital that may cause delays. Please be patient if a delay does occur.

## 2019-09-19 ENCOUNTER — Telehealth (HOSPITAL_COMMUNITY): Payer: Self-pay

## 2019-09-19 DIAGNOSIS — I5042 Chronic combined systolic (congestive) and diastolic (congestive) heart failure: Secondary | ICD-10-CM

## 2019-09-19 MED ORDER — FUROSEMIDE 40 MG PO TABS: ORAL_TABLET | ORAL | 3 refills | Status: DC

## 2019-09-19 NOTE — Progress Notes (Signed)
Advanced Heart Failure Clinic Note   Patient ID: [name], male   DOB: 12/24/1940, 79 y.o.   MRN: [id number] PCP: Dr. Lynnda Child Cardiology: Dr Aundra Dubin  79 y.o. with history of CAD s/p CABG and redo CABG as well as ischemic cardiomyopathy with CRT-D device presents for followup of CHF and CAD.  He had a Cardiolite in the 4/15 showing lateral wall ischemia.  LHC at that time showed all his vein grafts from both CABG surgeries occluded.  The LIMA-LAD was patent and there was a 90% distal LM stenosis.  The lateral ischemia likely corresponded to LCx territory downstream from the LM stenosis.  PCI of the distal LM was thought to be high risk and characteristics of the lesion were not favorable for PCI.  Last echo showed EF 25-30% with moderate RV systolic dysfunction.  He had RHC in 11/15 with normal filling pressures and relatively preserved cardiac index (2.55).    In 4/17, he was admitted with chest pain concerning for unstable angina.  Angiography showed 85% distal LM with 90% ostial LCx stenosis.  LIMA was patent but proximal LAD, proximal RCA, and all SVGs were totally occluded. High had successful DES to LM and PTCA to ostial LCx with Impella support.  [name] was stopped and he was put back on low dose lisinopril due to symptomatic hypotension.  He had Cardiolite in 11/17 with scar, no ischemia.   He was admitted in 1/19 with AKI, creatinine up to 3. Meds held then restarted.   Echo 3/19 with EF 30-35%.   He was admitted with syncope and AKI in 6/19.  He was thought to be dehydrated and orthostatic. Losartan stopped but eventually restarted.   He was noted to have prolonged atrial fibrillation by device check in 3/20, now on Eliquis.   Echo was done in 6/20, showing EF 25-30% with inferior/inferolateral AK, moderate LV dilation, mild LVH, mild MR, severe LAE.    CPX in 11/20 showed moderate functional limitation due to HF.   He returns for followup of CHF and CAD.  He is in atrial  fibrillation today, and device interrogation suggests atrial fibrillation for about 2 wks.  He has felt more fatigued for the last couple of weeks as well.  No dyspnea walking on flat ground.  Mild dyspnea with stairs.  Occasional atypical chest pain, this is a chronic pattern.  Weight down 1 lb.  No orthopnea/PND.  He was out of Eliquis for 4-5 days last week.  Still mild lightheadedness with standing but better since moving losartan and eplerenone to qhs.   Medtronic device interrogation: Atrial fibrillation persistently since 5/21, no VT, stable thoracic impedance. 99% BiV pacing.   ECG (personally reviewed): Atrial fibrillation, BiV pacing  Labs (9/15): LDL particle number 816, LDL 57, LFTs normal Labs (10/15): K 4.4, creatinine 1.4 Labs (11/15): K 4.3, creatinine 1.36 Labs (12/15): K 4.8, creatinine 1.34 Labs (3/16): K 4, creatinine 1.38, LDL 46, HDl 35 Labs (7/16): K 4, creatinine 1.39, BNP 211 Labs (03/03/15): K 4.1, creatinine 1.47, BNP 227, LDL 80, HDL 32 Labs (12/16): K 4.6, creatinine 1.12, LDL 68, HDL 34 Labs (4/17): K 3.4, creatinine 1.15 Labs (6/17): K 3.8, creatinine 1.67 Labs (7/17): K 4, creatinine 1.35 Labs (10/17): K 3.9, creatinine 1.31, LDL 45, HDL 30 Labs (2/18): K 4, creatinine 1.43 Labs (5/18): LDL 44, HDL 27 Labs (6/18): K 4.1, creatinine 1.49 Labs (12/18): K 4.4, creatinine 1.44 Labs (1/19): K 4.3, creatinine 1.48 Labs (2/19): K 3.7,  creatinine 1.6 Labs (6/19): K 4, creatinine 1.5 Labs (9/19): K 4.4, creatinine 1.6, hgb 13.4 Labs (10/19): K 4.3, creatinine 1.44 Labs (1/20): K 4, creatinine 1.65 Labs (6/20): K 3.9, creatinine 1.58, LDL 58, HDL 31, hgb 13.3 Labs (7/20): K 3.9, creatinine 1.5 Labs (10/20): K 4, creatinine 1.7 Labs (12/20): K 3.9, creatinine 1.56 Labs (5/21): K 4.1, creatinine 1.7  PMH: 1. Gout 2. Hyperlipidemia 3. CAD: CABG 1997 and redo 11/12.   - LHC (4/15) with totally occluded LAD, totally occluded RCA, 80% distal LM, 50% mLCx, 2  SVG-RCA grafts totally occluded, 2 SVG-OM grafts totally occluded, patent LIMA-LAD.  Cardiolite prior to 4/15 cath showed lateral wall ischemia (LCx territory).  PCI to distal LM would be a high risk procedure.   - Cardiolite (8/16) with EF 20%, severe scar in RCA and probably LCx territory, minimal peri-infarct ischemia.   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  - Unstable angina 4/17: 85% distal LM with 90% ostial LCx stenosis.  LIMA was patent but proximal LAD, proximal RCA, and all SVGs were totally occluded. He had successful DES to LM and PTCA to ostial LCx with Impella support  - Cardiolite (11/17): EF 20%, prior MI, no ischemia.  4. Ischemic Cardiomyopathy: Medtronic CRT-D device.   - Echo (6/15) with EF 25-30%, moderate LV dilation, inferior and inferolateral akinesis, moderately decreased RV systolic function, mild MR.   - RHC (11/15) with mean RA 5, PA 23/6, mean PCWP 9, CI 2.55.  - Echo (4/17): EF 25-30% with mild MR.  - Echo (11/17): EF 25-30%, moderately dilated LV, grade II diastolic dysfunction, mild-moderate MR, mildly decreased RV systolic function.  - Echo (3/19): EF 30-35%, inferior/inferolateral akinesis, normal RV size with mildly decreased systolic function.  - Echo (6/20): EF 25-30% with inferior/inferolateral AK, moderate LV dilation, mild LVH, mild MR, severe LAE. - CPX (11/20): VO2 max 14.9 (60% predicted), VE/VCO2 slope 34, RER 1.05.  Moderate functional deficit due to HF.  5. H/o cholecystectomy 6. OA 7. Depression 8. Type II diabetes 9. GERD 10. CKD stage 3 11. ?Early dementia 12. Atrial fibrillation: Paroxysmal.  13. B12 deficiency.   SH: Married, prior smoker (many years ago), lives in [place].    FH: CAD  ROS: All systems reviewed and negative except as per HPI.   Current Outpatient Medications  Medication Sig Dispense Refill  . allopurinol (ZYLOPRIM) 100 MG tablet Take 100 mg by mouth 2 (two) times daily.     Marland Kitchen  atorvastatin (LIPITOR) 80 MG tablet TAKE 1 TABLET BY MOUTH ONCE DAILY AT  6PM  IN  THE  EVENING 90 tablet 3  . carvedilol (COREG) 6.25 MG tablet Take 1 tablet (6.25 mg total) by mouth 2 (two) times daily. 180 tablet 1  . Continuous Blood Gluc Sensor (FREESTYLE LIBRE 14 DAY SENSOR) MISC USE TO CHECK GLUCOSE THREE TIMES DAILY AS DIRECTED    . ELIQUIS 5 MG TABS tablet Take 1 tablet by mouth twice daily 60 tablet 5  . eplerenone (INSPRA) 50 MG tablet Take 1 tablet (50 mg total) by mouth at bedtime. 30 tablet 5  . FARXIGA 5 MG TABS tablet Take 5 mg by mouth daily.    . furosemide (LASIX) 40 MG tablet Take 2 tablets (80 mg total) by mouth every morning AND 1.5 tablets (60 mg total) every evening. TAKE 2 TABLETS (80 MG)BY MOUTH EVERY MORNING AND 1 & 1/2 (ONE & ONE-HALF) (60 MG) EVERY EVENING. 315 tablet 0  .  losartan (COZAAR) 25 MG tablet Take 0.5 tablets (12.5 mg total) by mouth at bedtime. 45 tablet 3  . nitroGLYCERIN (NITROSTAT) 0.4 MG SL tablet DISSOLVE ONE TABLET UNDER THE TONGUE EVERY 5 MINUTES AS NEEDED FOR CHEST PAIN.  DO NOT EXCEED A TOTAL OF 3 DOSES IN 15 MINUTES 25 tablet 0  . Polyethyl Glycol-Propyl Glycol (LUBRICANT EYE DROPS) 0.4-0.3 % SOLN Place 1 drop into both eyes 3 (three) times daily as needed (for dry/irritated eyes.).    Marland Kitchen QUEtiapine (SEROQUEL) 100 MG tablet Take 100 mg by mouth at bedtime.   3  . Insulin Isophane & Regular Human (NOVOLIN 70/30 FLEXPEN) (70-30) 100 UNIT/ML PEN Inject into the skin. 40 after lunch    . ranolazine (RANEXA) 1000 MG SR tablet Take 500 mg by mouth 2 (two) times daily.    Marland Kitchen triamcinolone cream (KENALOG) 0.1 % Apply 1 application topically 2 (two) times daily as needed (for dry/irritated skin.).    Marland Kitchen venlafaxine (EFFEXOR-XR) 150 MG 24 hr capsule Take 150 mg by mouth daily with breakfast.     . vitamin B-12 (CYANOCOBALAMIN) 1000 MCG tablet Take 1,000 mcg by mouth daily.     No current facility-administered medications for this encounter.   BP (!) 112/56    Pulse 70   Wt 82.6 kg (182 lb 3.2 oz)   SpO2 96%   BMI 26.14 kg/m    Wt Readings from Last 3 Encounters:  09/18/19 82.6 kg (182 lb 3.2 oz)  06/18/19 83.2 kg (183 lb 8 oz)  03/20/19 86.6 kg (191 lb)    General: NAD Neck: No JVD, no thyromegaly or thyroid nodule.  Lungs: Clear to auscultation bilaterally with normal respiratory effort. CV: Nondisplaced PMI.  Heart regular S1/S2, no S3/S4, no murmur.  No peripheral edema.  No carotid bruit.  Normal pedal pulses.  Abdomen: Soft, nontender, no hepatosplenomegaly, no distention.  Skin: Intact without lesions or rashes.  Neurologic: Alert and oriented x 3.  Psych: Normal affect. Extremities: No clubbing or cyanosis.  HEENT: Normal.    Assessment/Plan: 1. Chronic systolic CHF: Ischemic cardiomyopathy.  Echo in 6/20 showed EF 25-30%. He has a Medtronic CRT-D device.  NYHA class II-III symptoms.  CPX in 11/20 showed moderate functional limitation due to CHF.  He is not volume overloaded by exam or Optivol.  Increased fatigue recently corresponds to development of persistent atrial fibrillation.  - Continue lower dose of Coreg, 6.25 mg bid.  - Continue Lasix 80 qam/60 qpm.  BMET today.     - Continue eplerenone 50 mg daily.   - BP did not tolerate Entresto.  Had ACEI cough. Continue losartan 12.5 mg daily.   - Continue dapagliflozin 5 mg daily. - With orthostatic symptoms, I will not be able to titrate up his current cardiomyopathy meds.  2. CAD: s/p CABG and redo. Had DES to distal LM, angioplasty to ostial LCx in 4/17. Cardiolite 11/17 with scar, no ischemia.  Rare atypical chest pain, no change to pattern.  - Continue statin.  - He is off ASA/Plavix and taking apixaban 5 mg bid.  - Continue Coreg and ranolazine 500 mg bid.  3. CKD stage III: BMET today.   4. Hyperlipidemia: Continue statin. Check lipids today.    5. Atrial fibrillation: Persistent atrial fibrillation since 5/21.  He is symptomatic with increased fatigue.  He missed 4-5  days of Eliquis last week. I think that we should get him back in NSR.     - Continue apixaban.    -  I will plan DCCV for next week, he will need TEE given missed Eliquis.  I will not start an antiarrhythmic.  This is his first recent episode of persistent atrial fibrillation. We discussed risks/benefits of procedure and he agrees.  6. CKD: Stage 3, probably diabetic nephropathy. BMET today.   [name] 09/19/2019

## 2019-09-19 NOTE — H&P (View-Only) (Signed)
Advanced Heart Failure Clinic Note   Patient ID: CRIAG WICKLUND, male   DOB: 17-Aug-1940, 79 y.o.   MRN: [id number] PCP: Dr. Lynnda Child Cardiology: Dr Aundra Dubin  64 y.o. with history of CAD s/p CABG and redo CABG as well as ischemic cardiomyopathy with CRT-D device presents for followup of CHF and CAD.  He had a Cardiolite in the 4/15 showing lateral wall ischemia.  LHC at that time showed all his vein grafts from both CABG surgeries occluded.  The LIMA-LAD was patent and there was a 90% distal LM stenosis.  The lateral ischemia likely corresponded to LCx territory downstream from the LM stenosis.  PCI of the distal LM was thought to be high risk and characteristics of the lesion were not favorable for PCI.  Last echo showed EF 25-30% with moderate RV systolic dysfunction.  He had RHC in 11/15 with normal filling pressures and relatively preserved cardiac index (2.55).    In 4/17, he was admitted with chest pain concerning for unstable angina.  Angiography showed 85% distal LM with 90% ostial LCx stenosis.  LIMA was patent but proximal LAD, proximal RCA, and all SVGs were totally occluded. High had successful DES to LM and PTCA to ostial LCx with Impella support.  [name] was stopped and he was put back on low dose lisinopril due to symptomatic hypotension.  He had Cardiolite in 11/17 with scar, no ischemia.   He was admitted in 1/19 with AKI, creatinine up to 3. Meds held then restarted.   Echo 3/19 with EF 30-35%.   He was admitted with syncope and AKI in 6/19.  He was thought to be dehydrated and orthostatic. Losartan stopped but eventually restarted.   He was noted to have prolonged atrial fibrillation by device check in 3/20, now on Eliquis.   Echo was done in 6/20, showing EF 25-30% with inferior/inferolateral AK, moderate LV dilation, mild LVH, mild MR, severe LAE.    CPX in 11/20 showed moderate functional limitation due to HF.   He returns for followup of CHF and CAD.  He is in atrial  fibrillation today, and device interrogation suggests atrial fibrillation for about 2 wks.  He has felt more fatigued for the last couple of weeks as well.  No dyspnea walking on flat ground.  Mild dyspnea with stairs.  Occasional atypical chest pain, this is a chronic pattern.  Weight down 1 lb.  No orthopnea/PND.  He was out of Eliquis for 4-5 days last week.  Still mild lightheadedness with standing but better since moving losartan and eplerenone to qhs.   Medtronic device interrogation: Atrial fibrillation persistently since 5/21, no VT, stable thoracic impedance. 99% BiV pacing.   ECG (personally reviewed): Atrial fibrillation, BiV pacing  Labs (9/15): LDL particle number 816, LDL 57, LFTs normal Labs (10/15): K 4.4, creatinine 1.4 Labs (11/15): K 4.3, creatinine 1.36 Labs (12/15): K 4.8, creatinine 1.34 Labs (3/16): K 4, creatinine 1.38, LDL 46, HDl 35 Labs (7/16): K 4, creatinine 1.39, BNP 211 Labs (03/03/15): K 4.1, creatinine 1.47, BNP 227, LDL 80, HDL 32 Labs (12/16): K 4.6, creatinine 1.12, LDL 68, HDL 34 Labs (4/17): K 3.4, creatinine 1.15 Labs (6/17): K 3.8, creatinine 1.67 Labs (7/17): K 4, creatinine 1.35 Labs (10/17): K 3.9, creatinine 1.31, LDL 45, HDL 30 Labs (2/18): K 4, creatinine 1.43 Labs (5/18): LDL 44, HDL 27 Labs (6/18): K 4.1, creatinine 1.49 Labs (12/18): K 4.4, creatinine 1.44 Labs (1/19): K 4.3, creatinine 1.48 Labs (2/19): K 3.7,  creatinine 1.6 Labs (6/19): K 4, creatinine 1.5 Labs (9/19): K 4.4, creatinine 1.6, hgb 13.4 Labs (10/19): K 4.3, creatinine 1.44 Labs (1/20): K 4, creatinine 1.65 Labs (6/20): K 3.9, creatinine 1.58, LDL 58, HDL 31, hgb 13.3 Labs (7/20): K 3.9, creatinine 1.5 Labs (10/20): K 4, creatinine 1.7 Labs (12/20): K 3.9, creatinine 1.56 Labs (5/21): K 4.1, creatinine 1.7  PMH: 1. Gout 2. Hyperlipidemia 3. CAD: CABG 1997 and redo 11/12.   - LHC (4/15) with totally occluded LAD, totally occluded RCA, 80% distal LM, 50% mLCx, 2  SVG-RCA grafts totally occluded, 2 SVG-OM grafts totally occluded, patent LIMA-LAD.  Cardiolite prior to 4/15 cath showed lateral wall ischemia (LCx territory).  PCI to distal LM would be a high risk procedure.   - Cardiolite (8/16) with EF 20%, severe scar in RCA and probably LCx territory, minimal peri-infarct ischemia.   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  - Unstable angina 4/17: 85% distal LM with 90% ostial LCx stenosis.  LIMA was patent but proximal LAD, proximal RCA, and all SVGs were totally occluded. He had successful DES to LM and PTCA to ostial LCx with Impella support  - Cardiolite (11/17): EF 20%, prior MI, no ischemia.  4. Ischemic Cardiomyopathy: Medtronic CRT-D device.   - Echo (6/15) with EF 25-30%, moderate LV dilation, inferior and inferolateral akinesis, moderately decreased RV systolic function, mild MR.   - RHC (11/15) with mean RA 5, PA 23/6, mean PCWP 9, CI 2.55.  - Echo (4/17): EF 25-30% with mild MR.  - Echo (11/17): EF 25-30%, moderately dilated LV, grade II diastolic dysfunction, mild-moderate MR, mildly decreased RV systolic function.  - Echo (3/19): EF 30-35%, inferior/inferolateral akinesis, normal RV size with mildly decreased systolic function.  - Echo (6/20): EF 25-30% with inferior/inferolateral AK, moderate LV dilation, mild LVH, mild MR, severe LAE. - CPX (11/20): VO2 max 14.9 (60% predicted), VE/VCO2 slope 34, RER 1.05.  Moderate functional deficit due to HF.  5. H/o cholecystectomy 6. OA 7. Depression 8. Type II diabetes 9. GERD 10. CKD stage 3 11. ?Early dementia 12. Atrial fibrillation: Paroxysmal.  13. B12 deficiency.   SH: Married, prior smoker (many years ago), lives in [place].    FH: CAD  ROS: All systems reviewed and negative except as per HPI.   Current Outpatient Medications  Medication Sig Dispense Refill   allopurinol (ZYLOPRIM) 100 MG tablet Take 100 mg by mouth 2 (two) times daily.       atorvastatin (LIPITOR) 80 MG tablet TAKE 1 TABLET BY MOUTH ONCE DAILY AT  6PM  IN  THE  EVENING 90 tablet 3   carvedilol (COREG) 6.25 MG tablet Take 1 tablet (6.25 mg total) by mouth 2 (two) times daily. 180 tablet 1   Continuous Blood Gluc Sensor (FREESTYLE LIBRE 14 DAY SENSOR) MISC USE TO CHECK GLUCOSE THREE TIMES DAILY AS DIRECTED     ELIQUIS 5 MG TABS tablet Take 1 tablet by mouth twice daily 60 tablet 5   eplerenone (INSPRA) 50 MG tablet Take 1 tablet (50 mg total) by mouth at bedtime. 30 tablet 5   FARXIGA 5 MG TABS tablet Take 5 mg by mouth daily.     furosemide (LASIX) 40 MG tablet Take 2 tablets (80 mg total) by mouth every morning AND 1.5 tablets (60 mg total) every evening. TAKE 2 TABLETS (80 MG)BY MOUTH EVERY MORNING AND 1 & 1/2 (ONE & ONE-HALF) (60 MG) EVERY EVENING. 315 tablet 0  losartan (COZAAR) 25 MG tablet Take 0.5 tablets (12.5 mg total) by mouth at bedtime. 45 tablet 3   nitroGLYCERIN (NITROSTAT) 0.4 MG SL tablet DISSOLVE ONE TABLET UNDER THE TONGUE EVERY 5 MINUTES AS NEEDED FOR CHEST PAIN.  DO NOT EXCEED A TOTAL OF 3 DOSES IN 15 MINUTES 25 tablet 0   Polyethyl Glycol-Propyl Glycol (LUBRICANT EYE DROPS) 0.4-0.3 % SOLN Place 1 drop into both eyes 3 (three) times daily as needed (for dry/irritated eyes.).     QUEtiapine (SEROQUEL) 100 MG tablet Take 100 mg by mouth at bedtime.   3   Insulin Isophane & Regular Human (NOVOLIN 70/30 FLEXPEN) (70-30) 100 UNIT/ML PEN Inject into the skin. 40 after lunch     ranolazine (RANEXA) 1000 MG SR tablet Take 500 mg by mouth 2 (two) times daily.     triamcinolone cream (KENALOG) 0.1 % Apply 1 application topically 2 (two) times daily as needed (for dry/irritated skin.).     venlafaxine (EFFEXOR-XR) 150 MG 24 hr capsule Take 150 mg by mouth daily with breakfast.      vitamin B-12 (CYANOCOBALAMIN) 1000 MCG tablet Take 1,000 mcg by mouth daily.     No current facility-administered medications for this encounter.   BP (!) 112/56     Pulse 70    Wt 82.6 kg (182 lb 3.2 oz)    SpO2 96%    BMI 26.14 kg/m    Wt Readings from Last 3 Encounters:  09/18/19 82.6 kg (182 lb 3.2 oz)  06/18/19 83.2 kg (183 lb 8 oz)  03/20/19 86.6 kg (191 lb)    General: NAD Neck: No JVD, no thyromegaly or thyroid nodule.  Lungs: Clear to auscultation bilaterally with normal respiratory effort. CV: Nondisplaced PMI.  Heart regular S1/S2, no S3/S4, no murmur.  No peripheral edema.  No carotid bruit.  Normal pedal pulses.  Abdomen: Soft, nontender, no hepatosplenomegaly, no distention.  Skin: Intact without lesions or rashes.  Neurologic: Alert and oriented x 3.  Psych: Normal affect. Extremities: No clubbing or cyanosis.  HEENT: Normal.    Assessment/Plan: 1. Chronic systolic CHF: Ischemic cardiomyopathy.  Echo in 6/20 showed EF 25-30%. He has a Medtronic CRT-D device.  NYHA class II-III symptoms.  CPX in 11/20 showed moderate functional limitation due to CHF.  He is not volume overloaded by exam or Optivol.  Increased fatigue recently corresponds to development of persistent atrial fibrillation.  - Continue lower dose of Coreg, 6.25 mg bid.  - Continue Lasix 80 qam/60 qpm.  BMET today.     - Continue eplerenone 50 mg daily.   - BP did not tolerate Entresto.  Had ACEI cough. Continue losartan 12.5 mg daily.   - Continue dapagliflozin 5 mg daily. - With orthostatic symptoms, I will not be able to titrate up his current cardiomyopathy meds.  2. CAD: s/p CABG and redo. Had DES to distal LM, angioplasty to ostial LCx in 4/17. Cardiolite 11/17 with scar, no ischemia.  Rare atypical chest pain, no change to pattern.  - Continue statin.  - He is off ASA/Plavix and taking apixaban 5 mg bid.  - Continue Coreg and ranolazine 500 mg bid.  3. CKD stage III: BMET today.   4. Hyperlipidemia: Continue statin. Check lipids today.    5. Atrial fibrillation: Persistent atrial fibrillation since 5/21.  He is symptomatic with increased fatigue.  He missed 4-5  days of Eliquis last week. I think that we should get him back in NSR.     -  Continue apixaban.    - I will plan DCCV for next week, he will need TEE given missed Eliquis.  I will not start an antiarrhythmic.  This is his first recent episode of persistent atrial fibrillation. We discussed risks/benefits of procedure and he agrees.  6. CKD: Stage 3, probably diabetic nephropathy. BMET today.   [name] 09/19/2019

## 2019-09-19 NOTE — Telephone Encounter (Signed)
Patients wife advised and verbalized understanding. Med list updated to reflect changes. Lab order placed. Lab appt scheduled

## 2019-09-19 NOTE — Telephone Encounter (Signed)
-----   Message from Larey Dresser, MD sent at 09/18/2019 10:15 PM EDT ----- Decrease Lasix to 80 qam/40 qpm.  BMET 2 wks.

## 2019-09-21 ENCOUNTER — Other Ambulatory Visit (HOSPITAL_COMMUNITY): Payer: Self-pay | Admitting: Cardiology

## 2019-09-24 ENCOUNTER — Other Ambulatory Visit (HOSPITAL_COMMUNITY)
Admission: RE | Admit: 2019-09-24 | Discharge: 2019-09-24 | Disposition: A | Discharge status: Home / Self Care | Payer: Medicare HMO | Source: Ambulatory Visit | Attending: Cardiology | Admitting: Cardiology

## 2019-09-24 DIAGNOSIS — Z01812 Encounter for preprocedural laboratory examination: Secondary | ICD-10-CM | POA: Insufficient documentation

## 2019-09-24 DIAGNOSIS — Z20822 Contact with and (suspected) exposure to covid-19: Secondary | ICD-10-CM | POA: Diagnosis not present

## 2019-09-24 LAB — SARS CORONAVIRUS 2 (TAT 6-24 HRS): SARS Coronavirus 2: NEGATIVE

## 2019-09-26 ENCOUNTER — Ambulatory Visit (HOSPITAL_COMMUNITY)
Admission: RE | Admit: 2019-09-26 | Discharge: 2019-09-26 | Disposition: A | Discharge status: Home / Self Care | Payer: Medicare HMO | Source: Ambulatory Visit | Attending: Cardiology | Admitting: Cardiology

## 2019-09-26 ENCOUNTER — Encounter (HOSPITAL_COMMUNITY): Payer: Self-pay | Admitting: Cardiology

## 2019-09-26 ENCOUNTER — Ambulatory Visit (HOSPITAL_COMMUNITY): Payer: Medicare HMO | Admitting: Certified Registered Nurse Anesthetist

## 2019-09-26 ENCOUNTER — Other Ambulatory Visit: Payer: Self-pay

## 2019-09-26 ENCOUNTER — Encounter (HOSPITAL_COMMUNITY)
Admission: RE | Disposition: A | Discharge status: Home / Self Care | Payer: Self-pay | Source: Ambulatory Visit | Attending: Cardiology

## 2019-09-26 ENCOUNTER — Ambulatory Visit (HOSPITAL_BASED_OUTPATIENT_CLINIC_OR_DEPARTMENT_OTHER)
Admission: RE | Admit: 2019-09-26 | Discharge: 2019-09-26 | Disposition: A | Discharge status: Home / Self Care | Payer: Medicare HMO | Source: Ambulatory Visit | Attending: Cardiology | Admitting: Cardiology

## 2019-09-26 DIAGNOSIS — I2510 Atherosclerotic heart disease of native coronary artery without angina pectoris: Secondary | ICD-10-CM | POA: Insufficient documentation

## 2019-09-26 DIAGNOSIS — Z794 Long term (current) use of insulin: Secondary | ICD-10-CM | POA: Diagnosis not present

## 2019-09-26 DIAGNOSIS — Z7901 Long term (current) use of anticoagulants: Secondary | ICD-10-CM | POA: Diagnosis not present

## 2019-09-26 DIAGNOSIS — I25110 Atherosclerotic heart disease of native coronary artery with unstable angina pectoris: Secondary | ICD-10-CM | POA: Diagnosis not present

## 2019-09-26 DIAGNOSIS — Z8249 Family history of ischemic heart disease and other diseases of the circulatory system: Secondary | ICD-10-CM | POA: Diagnosis not present

## 2019-09-26 DIAGNOSIS — Z87891 Personal history of nicotine dependence: Secondary | ICD-10-CM | POA: Diagnosis not present

## 2019-09-26 DIAGNOSIS — F329 Major depressive disorder, single episode, unspecified: Secondary | ICD-10-CM | POA: Insufficient documentation

## 2019-09-26 DIAGNOSIS — K219 Gastro-esophageal reflux disease without esophagitis: Secondary | ICD-10-CM | POA: Insufficient documentation

## 2019-09-26 DIAGNOSIS — I080 Rheumatic disorders of both mitral and aortic valves: Secondary | ICD-10-CM | POA: Insufficient documentation

## 2019-09-26 DIAGNOSIS — E1122 Type 2 diabetes mellitus with diabetic chronic kidney disease: Secondary | ICD-10-CM | POA: Diagnosis not present

## 2019-09-26 DIAGNOSIS — F0390 Unspecified dementia without behavioral disturbance: Secondary | ICD-10-CM | POA: Insufficient documentation

## 2019-09-26 DIAGNOSIS — I5042 Chronic combined systolic (congestive) and diastolic (congestive) heart failure: Secondary | ICD-10-CM

## 2019-09-26 DIAGNOSIS — Z951 Presence of aortocoronary bypass graft: Secondary | ICD-10-CM | POA: Insufficient documentation

## 2019-09-26 DIAGNOSIS — N183 Chronic kidney disease, stage 3 unspecified: Secondary | ICD-10-CM | POA: Insufficient documentation

## 2019-09-26 DIAGNOSIS — E785 Hyperlipidemia, unspecified: Secondary | ICD-10-CM | POA: Insufficient documentation

## 2019-09-26 DIAGNOSIS — Z955 Presence of coronary angioplasty implant and graft: Secondary | ICD-10-CM | POA: Diagnosis not present

## 2019-09-26 DIAGNOSIS — I255 Ischemic cardiomyopathy: Secondary | ICD-10-CM | POA: Insufficient documentation

## 2019-09-26 DIAGNOSIS — M109 Gout, unspecified: Secondary | ICD-10-CM | POA: Insufficient documentation

## 2019-09-26 DIAGNOSIS — I480 Paroxysmal atrial fibrillation: Secondary | ICD-10-CM | POA: Diagnosis not present

## 2019-09-26 DIAGNOSIS — I340 Nonrheumatic mitral (valve) insufficiency: Secondary | ICD-10-CM

## 2019-09-26 DIAGNOSIS — I4819 Other persistent atrial fibrillation: Secondary | ICD-10-CM | POA: Insufficient documentation

## 2019-09-26 DIAGNOSIS — I420 Dilated cardiomyopathy: Secondary | ICD-10-CM | POA: Insufficient documentation

## 2019-09-26 DIAGNOSIS — I252 Old myocardial infarction: Secondary | ICD-10-CM | POA: Insufficient documentation

## 2019-09-26 DIAGNOSIS — Z79899 Other long term (current) drug therapy: Secondary | ICD-10-CM | POA: Diagnosis not present

## 2019-09-26 DIAGNOSIS — I5022 Chronic systolic (congestive) heart failure: Secondary | ICD-10-CM | POA: Insufficient documentation

## 2019-09-26 DIAGNOSIS — R69 Illness, unspecified: Secondary | ICD-10-CM | POA: Diagnosis not present

## 2019-09-26 HISTORY — PX: TEE WITHOUT CARDIOVERSION: SHX5443

## 2019-09-26 HISTORY — PX: CARDIOVERSION: SHX1299

## 2019-09-26 LAB — GLUCOSE, CAPILLARY: Glucose-Capillary: 187 mg/dL — ABNORMAL HIGH (ref 70–99)

## 2019-09-26 SURGERY — ECHOCARDIOGRAM, TRANSESOPHAGEAL
Anesthesia: Monitor Anesthesia Care

## 2019-09-26 MED ORDER — SODIUM CHLORIDE 0.9 % IV SOLN: INTRAVENOUS | Status: DC

## 2019-09-26 MED ORDER — APIXABAN 5 MG PO TABS: 5.0000 mg | ORAL_TABLET | ORAL | Status: DC

## 2019-09-26 MED ORDER — PROPOFOL 500 MG/50ML IV EMUL
INTRAVENOUS | Status: DC | PRN
  Administered 2019-09-26: 50 ug/kg/min via INTRAVENOUS

## 2019-09-26 MED ORDER — SODIUM CHLORIDE 0.9 % IV SOLN: INTRAVENOUS | Status: DC | PRN

## 2019-09-26 MED ORDER — BUTAMBEN-TETRACAINE-BENZOCAINE 2-2-14 % EX AERO
INHALATION_SPRAY | CUTANEOUS | Status: DC | PRN
  Administered 2019-09-26: 2 via TOPICAL

## 2019-09-26 MED ORDER — PROPOFOL 10 MG/ML IV BOLUS
INTRAVENOUS | Status: DC | PRN
  Administered 2019-09-26 (×4): 10 mg via INTRAVENOUS

## 2019-09-26 NOTE — Interval H&P Note (Signed)
History and Physical Interval Note:  09/26/2019 12:05 PM  [name]  has presented today for surgery, with the diagnosis of AFIB.  The various methods of treatment have been discussed with the patient and family. After consideration of risks, benefits and other options for treatment, the patient has consented to  Procedure(s): TRANSESOPHAGEAL ECHOCARDIOGRAM (TEE) (N/A) CARDIOVERSION (N/A) as a surgical intervention.  The patient's history has been reviewed, patient examined, no change in status, stable for surgery.  I have reviewed the patient's chart and labs.  Questions were answered to the patient's satisfaction.     Saadia Dewitt [employer]

## 2019-09-26 NOTE — Discharge Instructions (Signed)

## 2019-09-26 NOTE — Anesthesia Postprocedure Evaluation (Signed)
Anesthesia Post Note  Patient: [name]  Procedure(s) Performed: TRANSESOPHAGEAL ECHOCARDIOGRAM (TEE) (N/A ) CARDIOVERSION (N/A )     Patient location during evaluation: Endoscopy Anesthesia Type: MAC Level of consciousness: awake and alert Pain management: pain level controlled Vital Signs Assessment: post-procedure vital signs reviewed and stable Respiratory status: spontaneous breathing, nonlabored ventilation, respiratory function stable and patient connected to nasal cannula oxygen Cardiovascular status: blood pressure returned to baseline and stable Postop Assessment: no apparent nausea or vomiting Anesthetic complications: no   No complications documented.  Last Vitals:  Vitals:   09/26/19 1058 09/26/19 1240  BP: 114/63 (!) 103/51  Pulse: (!) 52 62  Resp: 12 16  Temp: 36.6 C 36.7 C  SpO2: 97% 96%    Last Pain:  Vitals:   09/26/19 1240  TempSrc: Oral  PainSc: 0-No pain                 Barnet Glasgow

## 2019-09-26 NOTE — Anesthesia Preprocedure Evaluation (Signed)
Anesthesia Evaluation  Patient identified by MRN, date of birth, ID band Patient awake    Reviewed: Allergy & Precautions, NPO status , Patient's Chart, lab work & pertinent test results  Airway Mallampati: II  TM Distance: >3 FB Neck ROM: Full    Dental no notable dental hx. (+) Teeth Intact   Pulmonary former smoker,    Pulmonary exam normal breath sounds clear to auscultation       Cardiovascular hypertension, Pt. on medications and Pt. on home beta blockers + CAD, + Past MI and +CHF  Normal cardiovascular exam+ Cardiac Defibrillator  Rhythm:Regular Rate:Normal  09/24/18 Echo Left Ventricle: The left ventricle has severely reduced systolic  function, with an ejection fraction of 25-30%. The cavity size was  moderately dilated. There is mild concentric left ventricular hypertrophy.  Left ventricular diastolic Doppler parameters  are consistent with pseudonormalization. Elevated mean left atrial  pressure Mild akinesis of the left ventricular, entire inferoseptal wall,  inferior wall and inferolateral wall.    Neuro/Psych PSYCHIATRIC DISORDERS negative neurological ROS     GI/Hepatic Neg liver ROS, GERD  ,  Endo/Other  diabetes, Type 1, Insulin Dependent  Renal/GU CRFRenal disease     Musculoskeletal  (+) Arthritis ,   Abdominal   Peds  Hematology Lab Results      Component                Value               Date                      WBC                      7.5                 09/18/2019                HGB                      13.7                09/18/2019                HCT                      43.3                09/18/2019                MCV                      94.5                09/18/2019                PLT                      289                 09/18/2019             Anesthesia Other Findings   Reproductive/Obstetrics negative OB ROS                             Anesthesia  Physical Anesthesia Plan  ASA: IV  Anesthesia Plan: MAC   Post-op  Pain Management:    Induction: Intravenous  PONV Risk Score and Plan: 3 and Treatment may vary due to age or medical condition, Dexamethasone and Ondansetron  Airway Management Planned: Nasal Cannula and Natural Airway  Additional Equipment: None  Intra-op Plan:   Post-operative Plan:   Informed Consent: I have reviewed the patients History and Physical, chart, labs and discussed the procedure including the risks, benefits and alternatives for the proposed anesthesia with the patient or authorized representative who has indicated his/her understanding and acceptance.     Dental advisory given  Plan Discussed with: CRNA  Anesthesia Plan Comments:         Anesthesia Quick Evaluation

## 2019-09-26 NOTE — CV Procedure (Signed)
Procedure: TEE  Indication: Atrial fibrillation  Sedation: Per anesthesiology  Findings: Please see echo section for full report.  Moderately dilated LV with mild LV hypertrophy.  EF 25-30% with inferolateral and inferior akinesis, anterolateral severe hypokinesis.  Normal RV size and systolic function, device leads in RV.  Moderate left atrial enlargement, no LA appendage thrombus.  Mildly dilated right atrium.  Trivial TR, unable to estimate RV-RA gradient.  Mild mitral regurgitation.  Mildly calcified, trileaflet aortic valve with no stenosis or regurgitation.  Normal caliber thoracic aorta with mild plaque.   No LA appendage thrombus, may proceed to DCCV.   [name] 09/26/2019 12:32 PM

## 2019-09-26 NOTE — Procedures (Addendum)
Electrical Cardioversion Procedure Note [name] [id number] September 28, 1940  Procedure: Electrical Cardioversion Indications:  Atrial Fibrillation  Procedure Details Consent: Risks of procedure as well as the alternatives and risks of each were explained to the (patient/caregiver).  Consent for procedure obtained. Time Out: Verified patient identification, verified procedure, site/side was marked, verified correct patient position, special equipment/implants available, medications/allergies/relevent history reviewed, required imaging and test results available.  Performed  Patient placed on cardiac monitor, pulse oximetry, supplemental oxygen as necessary.  Sedation given: Per anesthesiology Pacer pads placed anterior and posterior chest.  Cardioverted 1 time(s).  Cardioverted at [place].  Evaluation Findings: Post procedure EKG shows: NSR Complications: None Patient did tolerate procedure well.   [name] 09/26/2019, 12:32 PM

## 2019-09-26 NOTE — Anesthesia Procedure Notes (Signed)
Procedure Name: MAC Date/Time: 09/26/2019 12:10 PM Performed by: Harden Mo, CRNA Pre-anesthesia Checklist: Patient identified, Emergency Drugs available, Suction available and Patient being monitored Patient Re-evaluated:Patient Re-evaluated prior to induction Oxygen Delivery Method: Nasal cannula Preoxygenation: Pre-oxygenation with 100% oxygen Induction Type: IV induction Placement Confirmation: positive ETCO2 and breath sounds checked- equal and bilateral Dental Injury: Teeth and Oropharynx as per pre-operative assessment

## 2019-09-26 NOTE — Progress Notes (Signed)
  Echocardiogram Echocardiogram Transesophageal has been performed.  Randa Lynn Damariz Paganelli 09/26/2019, 12:55 PM

## 2019-09-26 NOTE — Transfer of Care (Signed)
Immediate Anesthesia Transfer of Care Note  Patient: [name]  Procedure(s) Performed: TRANSESOPHAGEAL ECHOCARDIOGRAM (TEE) (N/A ) CARDIOVERSION (N/A )  Patient Location: Endoscopy Unit  Anesthesia Type:General  Level of Consciousness: awake and drowsy  Airway & Oxygen Therapy: Patient Spontanous Breathing  Post-op Assessment: Report given to RN, Post -op Vital signs reviewed and stable and Patient moving all extremities X 4  Post vital signs: Reviewed and stable  Last Vitals:  Vitals Value Taken Time  BP    Temp    Pulse    Resp    SpO2      Last Pain:  Vitals:   09/26/19 1058  TempSrc: Oral  PainSc: 0-No pain         Complications: No complications documented.

## 2019-09-28 ENCOUNTER — Encounter (HOSPITAL_COMMUNITY): Payer: Self-pay | Admitting: Cardiology

## 2019-10-01 ENCOUNTER — Telehealth (HOSPITAL_COMMUNITY): Payer: Self-pay | Admitting: Pharmacist

## 2019-10-01 ENCOUNTER — Other Ambulatory Visit (HOSPITAL_COMMUNITY): Payer: Self-pay | Admitting: Cardiology

## 2019-10-01 MED ORDER — NITROGLYCERIN 0.4 MG SL SUBL: 0.4000 mg | SUBLINGUAL_TABLET | SUBLINGUAL | 6 refills | Status: DC | PRN

## 2019-10-01 MED ORDER — NITROGLYCERIN 0.4 MG SL SUBL: SUBLINGUAL_TABLET | SUBLINGUAL | 0 refills | Status: DC

## 2019-10-01 NOTE — Telephone Encounter (Signed)
Nitroglycerin refill sent to [facility].  Audry Riles, PharmD, BCPS, BCCP, CPP Heart Failure Clinic Pharmacist [phone]

## 2019-10-02 ENCOUNTER — Ambulatory Visit (HOSPITAL_COMMUNITY)
Admission: RE | Admit: 2019-10-02 | Discharge: 2019-10-02 | Disposition: A | Discharge status: Home / Self Care | Payer: Medicare HMO | Source: Ambulatory Visit | Attending: Internal Medicine | Admitting: Internal Medicine

## 2019-10-02 ENCOUNTER — Other Ambulatory Visit: Payer: Self-pay

## 2019-10-02 DIAGNOSIS — I5042 Chronic combined systolic (congestive) and diastolic (congestive) heart failure: Secondary | ICD-10-CM

## 2019-10-02 LAB — BASIC METABOLIC PANEL
Anion gap: 10 (ref 5–15)
BUN: 33 mg/dL — ABNORMAL HIGH (ref 8–23)
CO2: 26 mmol/L (ref 22–32)
Calcium: 9 mg/dL (ref 8.9–10.3)
Chloride: 101 mmol/L (ref 98–111)
Creatinine, Ser: 1.66 mg/dL — ABNORMAL HIGH (ref 0.61–1.24)
GFR calc Af Amer: 45 mL/min — ABNORMAL LOW (ref >60)
GFR calc non Af Amer: 39 mL/min — ABNORMAL LOW (ref >60)
Glucose, Bld: 230 mg/dL — ABNORMAL HIGH (ref 70–99)
Potassium: 4.3 mmol/L (ref 3.5–5.1)
Sodium: 137 mmol/L (ref 135–145)

## 2019-10-13 ENCOUNTER — Ambulatory Visit (INDEPENDENT_AMBULATORY_CARE_PROVIDER_SITE_OTHER): Payer: Medicare HMO

## 2019-10-13 DIAGNOSIS — I5042 Chronic combined systolic (congestive) and diastolic (congestive) heart failure: Secondary | ICD-10-CM | POA: Diagnosis not present

## 2019-10-13 DIAGNOSIS — Z95810 Presence of automatic (implantable) cardiac defibrillator: Secondary | ICD-10-CM

## 2019-10-13 NOTE — Progress Notes (Addendum)
Advanced Heart Failure Clinic Note   Patient ID: [name], male   DOB: [birth year], 79 y.o.   MRN: [id number] PCP: Dr. Lynnda Child Cardiology: Dr Aundra Dubin  51 y.o. with history of CAD s/p CABG and redo CABG as well as ischemic cardiomyopathy with CRT-D device presents for followup of CHF and CAD.  He had a Cardiolite in the 4/15 showing lateral wall ischemia.  LHC at that time showed all his vein grafts from both CABG surgeries occluded.  The LIMA-LAD was patent and there was a 90% distal LM stenosis.  The lateral ischemia likely corresponded to LCx territory downstream from the LM stenosis.  PCI of the distal LM was thought to be high risk and characteristics of the lesion were not favorable for PCI.  Last echo showed EF 25-30% with moderate RV systolic dysfunction.  He had RHC in 11/15 with normal filling pressures and relatively preserved cardiac index (2.55).    In 4/17, he was admitted with chest pain concerning for unstable angina.  Angiography showed 85% distal LM with 90% ostial LCx stenosis.  LIMA was patent but proximal LAD, proximal RCA, and all SVGs were totally occluded. High had successful DES to LM and PTCA to ostial LCx with Impella support.  [name] was stopped and he was put back on low dose lisinopril due to symptomatic hypotension.  He had Cardiolite in 11/17 with scar, no ischemia.   He was admitted in 1/19 with AKI, creatinine up to 3. Meds held then restarted.   Echo 3/19 with EF 30-35%.   He was admitted with syncope and AKI in 6/19.  He was thought to be dehydrated and orthostatic. Losartan stopped but eventually restarted.   He was noted to have prolonged atrial fibrillation by device check in 3/20, now on Eliquis.   Echo was done in 6/20, showing EF 25-30% with inferior/inferolateral AK, moderate LV dilation, mild LVH, mild MR, severe LAE.    CPX in 11/20 showed moderate functional limitation due to HF.   09/26/19 TEE/S/P TEE DC-CV --> NSR  Today he returns for HF  follow up with his wife. Overall feeling a better after cardioversion.  Complaining of dizziness when standing. Says he has been active in his garden. SOB with inclines.  Denies PND/Orthopnea. Appetite ok. No fever or chills. Weight at home trending down 183>179 pounds. Taking all medications  Medtronic device interrogation: < 1 hour per day. No afib.   Labs (9/15): LDL particle number 816, LDL 57, LFTs normal Labs (10/15): K 4.4, creatinine 1.4 Labs (11/15): K 4.3, creatinine 1.36 Labs (12/15): K 4.8, creatinine 1.34 Labs (3/16): K 4, creatinine 1.38, LDL 46, HDl 35 Labs (7/16): K 4, creatinine 1.39, BNP 211 Labs (03/03/15): K 4.1, creatinine 1.47, BNP 227, LDL 80, HDL 32 Labs (12/16): K 4.6, creatinine 1.12, LDL 68, HDL 34 Labs (4/17): K 3.4, creatinine 1.15 Labs (6/17): K 3.8, creatinine 1.67 Labs (7/17): K 4, creatinine 1.35 Labs (10/17): K 3.9, creatinine 1.31, LDL 45, HDL 30 Labs (2/18): K 4, creatinine 1.43 Labs (5/18): LDL 44, HDL 27 Labs (6/18): K 4.1, creatinine 1.49 Labs (12/18): K 4.4, creatinine 1.44 Labs (1/19): K 4.3, creatinine 1.48 Labs (2/19): K 3.7, creatinine 1.6 Labs (6/19): K 4, creatinine 1.5 Labs (9/19): K 4.4, creatinine 1.6, hgb 13.4 Labs (10/19): K 4.3, creatinine 1.44 Labs (1/20): K 4, creatinine 1.65 Labs (6/20): K 3.9, creatinine 1.58, LDL 58, HDL 31, hgb 13.3 Labs (7/20): K 3.9, creatinine 1.5 Labs (10/20): K 4, creatinine  1.7 Labs (12/20): K 3.9, creatinine 1.56 Labs (5/21): K 4.1, creatinine 1.7  PMH: 1. Gout 2. Hyperlipidemia 3. CAD: CABG 1997 and redo 11/12.   - LHC (4/15) with totally occluded LAD, totally occluded RCA, 80% distal LM, 50% mLCx, 2 SVG-RCA grafts totally occluded, 2 SVG-OM grafts totally occluded, patent LIMA-LAD.  Cardiolite prior to 4/15 cath showed lateral wall ischemia (LCx territory).  PCI to distal LM would be a high risk procedure.   - Cardiolite (8/16) with EF 20%, severe scar in RCA and probably LCx territory, minimal  peri-infarct ischemia.   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  - Unstable angina 4/17: 85% distal LM with 90% ostial LCx stenosis.  LIMA was patent but proximal LAD, proximal RCA, and all SVGs were totally occluded. He had successful DES to LM and PTCA to ostial LCx with Impella support  - Cardiolite (11/17): EF 20%, prior MI, no ischemia.  4. Ischemic Cardiomyopathy: Medtronic CRT-D device.   - Echo (6/15) with EF 25-30%, moderate LV dilation, inferior and inferolateral akinesis, moderately decreased RV systolic function, mild MR.   - RHC (11/15) with mean RA 5, PA 23/6, mean PCWP 9, CI 2.55.  - Echo (4/17): EF 25-30% with mild MR.  - Echo (11/17): EF 25-30%, moderately dilated LV, grade II diastolic dysfunction, mild-moderate MR, mildly decreased RV systolic function.  - Echo (3/19): EF 30-35%, inferior/inferolateral akinesis, normal RV size with mildly decreased systolic function.  - Echo (6/20): EF 25-30% with inferior/inferolateral AK, moderate LV dilation, mild LVH, mild MR, severe LAE. - CPX (11/20): VO2 max 14.9 (60% predicted), VE/VCO2 slope 34, RER 1.05.  Moderate functional deficit due to HF.  5. H/o cholecystectomy 6. OA 7. Depression 8. Type II diabetes 9. GERD 10. CKD stage 3 11. ?Early dementia 12. Atrial fibrillation: Paroxysmal.  13. B12 deficiency.   SH: Married, prior smoker (many years ago), lives in [place].    FH: CAD  ROS: All systems reviewed and negative except as per HPI.   Current Outpatient Medications  Medication Sig Dispense Refill  . acetaminophen (TYLENOL) 500 MG tablet Take 1,000 mg by mouth every 6 (six) hours as needed for moderate pain.    Marland Kitchen allopurinol (ZYLOPRIM) 100 MG tablet Take 200 mg by mouth daily.     . Aromatic Inhalants (VICKS VAPOINHALER) INHA Inhale 1 puff into the lungs daily as needed (congestion).    Marland Kitchen atorvastatin (LIPITOR) 80 MG tablet TAKE 1 TABLET BY MOUTH ONCE DAILY AT  6PM  IN  THE   EVENING (Patient taking differently: Take 80 mg by mouth daily at 6 PM. ) 90 tablet 3  . carvedilol (COREG) 6.25 MG tablet Take 1 tablet by mouth twice daily 180 tablet 3  . Continuous Blood Gluc Sensor (FREESTYLE LIBRE 14 DAY SENSOR) MISC USE TO CHECK GLUCOSE THREE TIMES DAILY AS DIRECTED    . ELIQUIS 5 MG TABS tablet Take 1 tablet by mouth twice daily (Patient taking differently: Take 5 mg by mouth 2 (two) times daily. ) 60 tablet 5  . eplerenone (INSPRA) 50 MG tablet Take 1 tablet (50 mg total) by mouth at bedtime. 30 tablet 5  . FARXIGA 5 MG TABS tablet Take 5 mg by mouth daily.    . furosemide (LASIX) 40 MG tablet Take 2 tablets (80 mg total) by mouth every morning AND 1 tablet (40 mg total) every evening. 270 tablet 3  . Homeopathic Products [facility] ALLERGY EYE RELIEF OP) Place 1  drop into both eyes daily as needed (irritation).    . Insulin Isophane & Regular Human (NOVOLIN 70/30 FLEXPEN) (70-30) 100 UNIT/ML PEN Inject 40 Units into the skin daily before lunch.     . loperamide (IMODIUM A-D) 2 MG tablet Take 2 mg by mouth 4 (four) times daily as needed for diarrhea or loose stools.    Marland Kitchen losartan (COZAAR) 25 MG tablet Take 0.5 tablets (12.5 mg total) by mouth at bedtime. 45 tablet 3  . QUEtiapine (SEROQUEL) 100 MG tablet Take 50-100 mg by mouth at bedtime.   3  . ranolazine (RANEXA) 1000 MG SR tablet Take 1,000 mg by mouth at bedtime.     . triamcinolone (KENALOG) 0.147 MG/GM topical spray Apply 1 spray topically See admin instructions. Use after each shower    . triamcinolone cream (KENALOG) 0.1 % Apply 1 application topically 2 (two) times daily as needed (for dry/irritated skin.).    Marland Kitchen venlafaxine (EFFEXOR-XR) 150 MG 24 hr capsule Take 150 mg by mouth daily with breakfast.     . vitamin B-12 (CYANOCOBALAMIN) 1000 MCG tablet Take 1,000 mcg by mouth daily.    . nitroGLYCERIN (NITROSTAT) 0.4 MG SL tablet Place 1 tablet (0.4 mg total) under the tongue every 5 (five) minutes as needed for  chest pain. DO NOT EXCEED A TOTAL OF 3 DOSES IN 15 MINUTES (Patient not taking: Reported on 10/14/2019) 25 tablet 6   No current facility-administered medications for this encounter.   BP (!) 104/58   Pulse 88   Wt 81.5 kg (179 lb 9.6 oz)   SpO2 (!) 89%   BMI 25.77 kg/m   Sitting 84/ 50 Standing 74/50    Wt Readings from Last 3 Encounters:  10/14/19 81.5 kg (179 lb 9.6 oz)  09/18/19 82.6 kg (182 lb 3.2 oz)  06/18/19 83.2 kg (183 lb 8 oz)    General:  Well appearing. No resp difficulty HEENT: normal Neck: supple. no JVD. Carotids 2+ bilat; no bruits. No lymphadenopathy or thryomegaly appreciated. Cor: PMI nondisplaced. Regular rate & rhythm. No rubs, gallops or murmurs. Lungs: clear Abdomen: soft, nontender, nondistended. No hepatosplenomegaly. No bruits or masses. Good bowel sounds. Extremities: no cyanosis, clubbing, rash, edema Neuro: alert & orientedx3, cranial nerves grossly intact. moves all 4 extremities w/o difficulty. Affect pleasant  EKG: AV Paced. QRS 162 ms.   Assessment/Plan: 1. Chronic systolic CHF: Ischemic cardiomyopathy.  Echo in 6/20 showed EF 25-30%. He has a Medtronic CRT-D device.   CPX in 11/20 showed moderate functional limitation due to CHF.   -NYHA II-III. Activity < 1 hour.  - Continue lower dose of Coreg, 6.25 mg bid.  - Appears dry today. I personally checked orthostatic blood pressure ---> +  Suspect he is sweating a lot working in his garden.  Hold lasix today. Tomorrow he will resume lasix 80 mg in am and stop lasix in pm. Discussed taking extra 40 mg of lasix if needed.    - Continue eplerenone 50 mg daily.   - BP did not tolerate Entresto.  Had ACEI cough. - Continue losartan 12.5 mg daily.   - Continue dapagliflozin 5 mg daily.  2. CAD: s/p CABG and redo. Had DES to distal LM, angioplasty to ostial LCx in 4/17. Cardiolite 11/17 with scar, no ischemia.   - No chest pain.  - Continue statin.  - He is off ASA/Plavix and taking apixaban 5 mg bid.    - Continue Coreg and ranolazine 1000 mg daily   3.  CKD stage III: I reviewed BMET from 10/02/19 stable renal function.  4. Hyperlipidemia: Continue statin. Recent lipids ok.     5. Atrial fibrillation: Persistent atrial fibrillation since 5/21.    - Continue apixaban.   No bleeding issues.  - S/P DCCV on 09/26/19 with restoration of NSR. Continue current dose bb.  - In SR today.  6. Orthostatic Hypotension Holding lasix today as noted above.   Follow up in 8 weeks. Repeat BMET and EKG at that time.   Wave Calzada NP-C  10/14/2019

## 2019-10-14 ENCOUNTER — Encounter (HOSPITAL_COMMUNITY): Payer: Self-pay

## 2019-10-14 ENCOUNTER — Other Ambulatory Visit: Payer: Self-pay

## 2019-10-14 ENCOUNTER — Ambulatory Visit (HOSPITAL_COMMUNITY)
Admission: RE | Admit: 2019-10-14 | Discharge: 2019-10-14 | Disposition: A | Discharge status: Home / Self Care | Payer: Medicare HMO | Source: Ambulatory Visit | Attending: Adult Health | Admitting: Adult Health

## 2019-10-14 VITALS — BP 104/58 | HR 88 | Wt 179.6 lb

## 2019-10-14 DIAGNOSIS — I4819 Other persistent atrial fibrillation: Secondary | ICD-10-CM | POA: Diagnosis not present

## 2019-10-14 DIAGNOSIS — Z7901 Long term (current) use of anticoagulants: Secondary | ICD-10-CM | POA: Diagnosis not present

## 2019-10-14 DIAGNOSIS — Z794 Long term (current) use of insulin: Secondary | ICD-10-CM | POA: Diagnosis not present

## 2019-10-14 DIAGNOSIS — E1122 Type 2 diabetes mellitus with diabetic chronic kidney disease: Secondary | ICD-10-CM | POA: Insufficient documentation

## 2019-10-14 DIAGNOSIS — I255 Ischemic cardiomyopathy: Secondary | ICD-10-CM | POA: Diagnosis not present

## 2019-10-14 DIAGNOSIS — N183 Chronic kidney disease, stage 3 unspecified: Secondary | ICD-10-CM | POA: Insufficient documentation

## 2019-10-14 DIAGNOSIS — F329 Major depressive disorder, single episode, unspecified: Secondary | ICD-10-CM | POA: Insufficient documentation

## 2019-10-14 DIAGNOSIS — Z79899 Other long term (current) drug therapy: Secondary | ICD-10-CM | POA: Insufficient documentation

## 2019-10-14 DIAGNOSIS — M109 Gout, unspecified: Secondary | ICD-10-CM | POA: Diagnosis not present

## 2019-10-14 DIAGNOSIS — I5022 Chronic systolic (congestive) heart failure: Secondary | ICD-10-CM | POA: Diagnosis not present

## 2019-10-14 DIAGNOSIS — Z9049 Acquired absence of other specified parts of digestive tract: Secondary | ICD-10-CM | POA: Insufficient documentation

## 2019-10-14 DIAGNOSIS — I5042 Chronic combined systolic (congestive) and diastolic (congestive) heart failure: Secondary | ICD-10-CM

## 2019-10-14 DIAGNOSIS — Z951 Presence of aortocoronary bypass graft: Secondary | ICD-10-CM | POA: Insufficient documentation

## 2019-10-14 DIAGNOSIS — I252 Old myocardial infarction: Secondary | ICD-10-CM | POA: Diagnosis not present

## 2019-10-14 DIAGNOSIS — I480 Paroxysmal atrial fibrillation: Secondary | ICD-10-CM | POA: Diagnosis not present

## 2019-10-14 DIAGNOSIS — Z955 Presence of coronary angioplasty implant and graft: Secondary | ICD-10-CM | POA: Diagnosis not present

## 2019-10-14 DIAGNOSIS — Z87891 Personal history of nicotine dependence: Secondary | ICD-10-CM | POA: Diagnosis not present

## 2019-10-14 DIAGNOSIS — I951 Orthostatic hypotension: Secondary | ICD-10-CM

## 2019-10-14 DIAGNOSIS — Z95810 Presence of automatic (implantable) cardiac defibrillator: Secondary | ICD-10-CM | POA: Insufficient documentation

## 2019-10-14 DIAGNOSIS — I2510 Atherosclerotic heart disease of native coronary artery without angina pectoris: Secondary | ICD-10-CM | POA: Diagnosis not present

## 2019-10-14 DIAGNOSIS — E785 Hyperlipidemia, unspecified: Secondary | ICD-10-CM | POA: Insufficient documentation

## 2019-10-14 DIAGNOSIS — K219 Gastro-esophageal reflux disease without esophagitis: Secondary | ICD-10-CM | POA: Diagnosis not present

## 2019-10-14 DIAGNOSIS — Z8249 Family history of ischemic heart disease and other diseases of the circulatory system: Secondary | ICD-10-CM | POA: Insufficient documentation

## 2019-10-14 MED ORDER — FUROSEMIDE 40 MG PO TABS: 80.0000 mg | ORAL_TABLET | Freq: Every day | ORAL | 3 refills | Status: DC

## 2019-10-14 NOTE — Patient Instructions (Signed)
HOLD Lasix today 10/14/2019 then, DECREASE Lasix to 80 mg in the AM  Your physician recommends that you schedule a follow-up appointment in: 6-8 weeks with Dr Aundra Dubin  Do the following things EVERYDAY: 1) Weigh yourself in the morning before breakfast. Write it down and keep it in a log. 2) Take your medicines as prescribed 3) Eat low salt foods--Limit salt (sodium) to 2000 mg per day.  4) Stay as active as you can everyday 5) Limit all fluids for the day to less than 2 liters  At the [facility], you and your health needs are our priority. As part of our continuing mission to provide you with exceptional heart care, we have created designated Provider Care Teams. These Care Teams include your primary Cardiologist (physician) and Advanced Practice Providers (APPs- Physician Assistants and Nurse Practitioners) who all work together to provide you with the care you need, when you need it.   You may see any of the following providers on your designated Care Team at your next follow up: Marland Kitchen Dr Glori Bickers . Dr [name] . Darrick Grinder, NP . Lyda Jester, PA . Audry Riles, PharmD   Please be sure to bring in all your medications bottles to every appointment.

## 2019-10-15 NOTE — Progress Notes (Signed)
EPIC Encounter for ICM Monitoring  Patient Name: [name] is a 79 y.o. male Date: 10/15/2019 Primary Care Physican: Mayra Neer, MD Primary Cardiologist:[place] Electrophysiologist: Allred Bi-V Pacing: 98.0% 09/10/2019 Weight: 180 lbs  Clinical Status (26-Sep-2019 to 13-Oct-2019)  Time in AT/AF <0.1 hr/day (<0.1%)  Longest AT/AF 19 days       Patient seen by HF clinic on 10/14/2019. Cardioversion procedure 09/26/19      OptiVolThoracicimpedance normal.  Prescribed:Furosemide40 mgtake 2 tablets (80 mg total) daily.  Labs: 10/02/2019 Creatinine 1.66, BUN 33, Potassium 4.3, Sodium 137, GFR 39-45 09/18/2019 Creatinine 1.99, BUN 39, Potassium 3.9, Sodium 136, GFR 31-36  06/18/2019 Creatinine 1.63, BUN 26, Potassium 3.8, Sodium 137, GFR 40-47  A complete set of results can be found in Results Review.  Recommendations: None.  Follow-up plan: ICM clinic phone appointment on7/30/2021. 91 day device clinic remote transmission7/29/2021.   EP/Cardiology Office Visits: 12/12/2019 with Dr. Aundra Dubin.  Last EP visit was 07/15/2018 with Dr Rayann Heman and message sent to scheduler to contact patient.  Copy of ICM check sent to Dr. Rayann Heman.   3 month ICM trend: 10/13/2019    1 Year ICM trend:       [name], RN 10/15/2019 11:24 AM

## 2019-10-29 ENCOUNTER — Emergency Department (HOSPITAL_COMMUNITY)
Admission: EM | Admit: 2019-10-29 | Discharge: 2019-10-29 | Disposition: A | Discharge status: Home / Self Care | Payer: Medicare HMO | Attending: Emergency Medicine | Admitting: Emergency Medicine

## 2019-10-29 ENCOUNTER — Emergency Department (HOSPITAL_COMMUNITY): Payer: Medicare HMO

## 2019-10-29 ENCOUNTER — Encounter (HOSPITAL_COMMUNITY): Payer: Self-pay | Admitting: Emergency Medicine

## 2019-10-29 ENCOUNTER — Other Ambulatory Visit: Payer: Self-pay

## 2019-10-29 DIAGNOSIS — S3991XA Unspecified injury of abdomen, initial encounter: Secondary | ICD-10-CM | POA: Diagnosis not present

## 2019-10-29 DIAGNOSIS — Y939 Activity, unspecified: Secondary | ICD-10-CM | POA: Diagnosis not present

## 2019-10-29 DIAGNOSIS — M533 Sacrococcygeal disorders, not elsewhere classified: Secondary | ICD-10-CM | POA: Diagnosis not present

## 2019-10-29 DIAGNOSIS — E119 Type 2 diabetes mellitus without complications: Secondary | ICD-10-CM | POA: Insufficient documentation

## 2019-10-29 DIAGNOSIS — Z951 Presence of aortocoronary bypass graft: Secondary | ICD-10-CM | POA: Diagnosis not present

## 2019-10-29 DIAGNOSIS — S0990XA Unspecified injury of head, initial encounter: Secondary | ICD-10-CM | POA: Diagnosis not present

## 2019-10-29 DIAGNOSIS — M160 Bilateral primary osteoarthritis of hip: Secondary | ICD-10-CM | POA: Diagnosis not present

## 2019-10-29 DIAGNOSIS — I130 Hypertensive heart and chronic kidney disease with heart failure and stage 1 through stage 4 chronic kidney disease, or unspecified chronic kidney disease: Secondary | ICD-10-CM | POA: Insufficient documentation

## 2019-10-29 DIAGNOSIS — I5042 Chronic combined systolic (congestive) and diastolic (congestive) heart failure: Secondary | ICD-10-CM | POA: Diagnosis not present

## 2019-10-29 DIAGNOSIS — F17210 Nicotine dependence, cigarettes, uncomplicated: Secondary | ICD-10-CM | POA: Diagnosis not present

## 2019-10-29 DIAGNOSIS — S299XXA Unspecified injury of thorax, initial encounter: Secondary | ICD-10-CM | POA: Diagnosis not present

## 2019-10-29 DIAGNOSIS — I700 Atherosclerosis of aorta: Secondary | ICD-10-CM | POA: Diagnosis not present

## 2019-10-29 DIAGNOSIS — R519 Headache, unspecified: Secondary | ICD-10-CM | POA: Insufficient documentation

## 2019-10-29 DIAGNOSIS — Z79899 Other long term (current) drug therapy: Secondary | ICD-10-CM | POA: Insufficient documentation

## 2019-10-29 DIAGNOSIS — I2510 Atherosclerotic heart disease of native coronary artery without angina pectoris: Secondary | ICD-10-CM | POA: Diagnosis not present

## 2019-10-29 DIAGNOSIS — Y92009 Unspecified place in unspecified non-institutional (private) residence as the place of occurrence of the external cause: Secondary | ICD-10-CM | POA: Diagnosis not present

## 2019-10-29 DIAGNOSIS — M1711 Unilateral primary osteoarthritis, right knee: Secondary | ICD-10-CM | POA: Diagnosis not present

## 2019-10-29 DIAGNOSIS — Z794 Long term (current) use of insulin: Secondary | ICD-10-CM | POA: Diagnosis not present

## 2019-10-29 DIAGNOSIS — I6523 Occlusion and stenosis of bilateral carotid arteries: Secondary | ICD-10-CM | POA: Diagnosis not present

## 2019-10-29 DIAGNOSIS — S59911A Unspecified injury of right forearm, initial encounter: Secondary | ICD-10-CM | POA: Diagnosis not present

## 2019-10-29 DIAGNOSIS — R69 Illness, unspecified: Secondary | ICD-10-CM | POA: Diagnosis not present

## 2019-10-29 DIAGNOSIS — S51811A Laceration without foreign body of right forearm, initial encounter: Secondary | ICD-10-CM

## 2019-10-29 DIAGNOSIS — Y998 Other external cause status: Secondary | ICD-10-CM | POA: Insufficient documentation

## 2019-10-29 DIAGNOSIS — M25561 Pain in right knee: Secondary | ICD-10-CM | POA: Insufficient documentation

## 2019-10-29 DIAGNOSIS — S3993XA Unspecified injury of pelvis, initial encounter: Secondary | ICD-10-CM | POA: Diagnosis not present

## 2019-10-29 DIAGNOSIS — N1831 Chronic kidney disease, stage 3a: Secondary | ICD-10-CM | POA: Diagnosis not present

## 2019-10-29 DIAGNOSIS — M7989 Other specified soft tissue disorders: Secondary | ICD-10-CM | POA: Diagnosis not present

## 2019-10-29 DIAGNOSIS — G44309 Post-traumatic headache, unspecified, not intractable: Secondary | ICD-10-CM | POA: Diagnosis not present

## 2019-10-29 DIAGNOSIS — Z9049 Acquired absence of other specified parts of digestive tract: Secondary | ICD-10-CM | POA: Diagnosis not present

## 2019-10-29 DIAGNOSIS — S8991XA Unspecified injury of right lower leg, initial encounter: Secondary | ICD-10-CM | POA: Diagnosis not present

## 2019-10-29 DIAGNOSIS — S199XXA Unspecified injury of neck, initial encounter: Secondary | ICD-10-CM | POA: Diagnosis not present

## 2019-10-29 LAB — COMPREHENSIVE METABOLIC PANEL
ALT: 20 U/L (ref 0–44)
AST: 22 U/L (ref 15–41)
Albumin: 3.5 g/dL (ref 3.5–5.0)
Alkaline Phosphatase: 108 U/L (ref 38–126)
Anion gap: 10 (ref 5–15)
BUN: 32 mg/dL — ABNORMAL HIGH (ref 8–23)
CO2: 27 mmol/L (ref 22–32)
Calcium: 8.6 mg/dL — ABNORMAL LOW (ref 8.9–10.3)
Chloride: 96 mmol/L — ABNORMAL LOW (ref 98–111)
Creatinine, Ser: 1.75 mg/dL — ABNORMAL HIGH (ref 0.61–1.24)
GFR calc Af Amer: 43 mL/min — ABNORMAL LOW (ref >60)
GFR calc non Af Amer: 37 mL/min — ABNORMAL LOW (ref >60)
Glucose, Bld: 285 mg/dL — ABNORMAL HIGH (ref 70–99)
Potassium: 4.1 mmol/L (ref 3.5–5.1)
Sodium: 133 mmol/L — ABNORMAL LOW (ref 135–145)
Total Bilirubin: 0.6 mg/dL (ref 0.3–1.2)
Total Protein: 6.9 g/dL (ref 6.5–8.1)

## 2019-10-29 LAB — I-STAT CHEM 8, ED
BUN: 31 mg/dL — ABNORMAL HIGH (ref 8–23)
Calcium, Ion: 1.14 mmol/L — ABNORMAL LOW (ref 1.15–1.40)
Chloride: 95 mmol/L — ABNORMAL LOW (ref 98–111)
Creatinine, Ser: 1.8 mg/dL — ABNORMAL HIGH (ref 0.61–1.24)
Glucose, Bld: 279 mg/dL — ABNORMAL HIGH (ref 70–99)
HCT: 42 % (ref 39.0–52.0)
Hemoglobin: 14.3 g/dL (ref 13.0–17.0)
Potassium: 4 mmol/L (ref 3.5–5.1)
Sodium: 136 mmol/L (ref 135–145)
TCO2: 31 mmol/L (ref 22–32)

## 2019-10-29 LAB — CBC
HCT: 42.1 % (ref 39.0–52.0)
Hemoglobin: 13.7 g/dL (ref 13.0–17.0)
MCH: 30.6 pg (ref 26.0–34.0)
MCHC: 32.5 g/dL (ref 30.0–36.0)
MCV: 94 fL (ref 80.0–100.0)
Platelets: 222 10*3/uL (ref 150–400)
RBC: 4.48 MIL/uL (ref 4.22–5.81)
RDW: 15.9 % — ABNORMAL HIGH (ref 11.5–15.5)
WBC: 8 10*3/uL (ref 4.0–10.5)
nRBC: 0 % (ref 0.0–0.2)

## 2019-10-29 LAB — LACTIC ACID, PLASMA: Lactic Acid, Venous: 1.4 mmol/L (ref 0.5–1.9)

## 2019-10-29 LAB — PROTIME-INR
INR: 1.6 — ABNORMAL HIGH (ref 0.8–1.2)
Prothrombin Time: 18.6 seconds — ABNORMAL HIGH (ref 11.4–15.2)

## 2019-10-29 MED ORDER — SODIUM CHLORIDE 0.9 % IV BOLUS: 1000.0000 mL | Freq: Once | INTRAVENOUS | Status: DC

## 2019-10-29 MED ORDER — IOHEXOL 300 MG/ML  SOLN
75.0000 mL | Freq: Once | INTRAMUSCULAR | Status: AC | PRN
  Administered 2019-10-29: 75 mL via INTRAVENOUS

## 2019-10-29 MED ORDER — SODIUM CHLORIDE 0.9 % IV BOLUS
500.0000 mL | Freq: Once | INTRAVENOUS | Status: AC
  Administered 2019-10-29: 500 mL via INTRAVENOUS

## 2019-10-29 MED ORDER — BACITRACIN ZINC 500 UNIT/GM EX OINT
TOPICAL_OINTMENT | Freq: Once | CUTANEOUS | Status: AC
  Administered 2019-10-29: 1 via TOPICAL
  Filled 2019-10-29: qty 0.9
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
## 2019-10-29 NOTE — ED Notes (Signed)
Pt states that he has a knee brace and crutches at home and doesn't need another, pa notified, states that this is fine and for pt to use his knee brace and crutches from home
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
## 2019-10-29 NOTE — Discharge Instructions (Signed)
At this time there does not appear to be the presence of an emergent medical condition, however there is always the potential for conditions to change. Please read and follow the below instructions.  Please return to the Emergency Department immediately for any new or worsening symptoms. Please be sure to follow up with your Primary Care Provider within one week regarding your visit today; please call their office to schedule an appointment even if you are feeling better for a follow-up visit. As we discussed your imaging today showed multiple incidental findings among these include osteoarthritis, atherosclerosis, degeneration of your thumb joint, generalized atrophy of the brain, spinal stenosis, foraminal stenosis, bilateral renal cortical scarring, diverticulosis, lytic lesion of the tibia bone, knee effusion.  Please discuss these incidental findings with your primary care provider at your follow-up visit.  You may review all of your results on your MyChart account, discuss each of them with your PCP at your follow-up visit. Please use rest ice and elevation to help with your right knee pain.  You may use the crutches provided to help with getting around and keep weight off of your leg.  You may call the orthopedic specialist Dr. Aline Brochure on your discharge paperwork to schedule follow-up appointment for further evaluation. Be sure to keep your wound clean and covered with sterile bandages.  You may use antibiotic ointment to help avoid infection.  Please have this area rechecked by your primary care provider in 2-3 days to ensure proper healing.  Return to the ER if you develop signs of infection.   Get help right away if: You have: Loss of feeling (numbness), tingling, or weakness in your arms or legs. Very bad neck pain, especially tenderness in the middle of the back of your neck. A change in your ability to control your pee or poop (stool). More pain in any area of your body. Swelling in any  area of your body, especially your legs. Shortness of breath or light-headedness. Chest pain. Blood in your pee, poop, or vomit. Very bad pain in your belly (abdomen) or your back. Very bad headaches or headaches that are getting worse. Sudden vision loss or double vision. Your eye suddenly turns red. Your knee swells, and the swelling gets worse. You cannot move your knee. You have very bad knee pain. The black center of your eye (pupil) is an odd shape or size. You have any new/concerning or worsening of symptoms  Please read the additional information packets attached to your discharge summary.  Do not take your medicine if  develop an itchy rash, swelling in your mouth or lips, or difficulty breathing; call 911 and seek immediate emergency medical attention if this occurs.  You may review your lab tests and imaging results in their entirety on your MyChart account.  Please discuss all results of fully with your primary care provider and other specialist at your follow-up visit.  Note: Portions of this text may have been transcribed using voice recognition software. Every effort was made to ensure accuracy; however, inadvertent computerized transcription errors may still be present.

## 2019-11-13 ENCOUNTER — Ambulatory Visit (INDEPENDENT_AMBULATORY_CARE_PROVIDER_SITE_OTHER): Payer: Medicare HMO | Admitting: *Deleted

## 2019-11-13 DIAGNOSIS — I255 Ischemic cardiomyopathy: Secondary | ICD-10-CM

## 2019-11-13 DIAGNOSIS — I5022 Chronic systolic (congestive) heart failure: Secondary | ICD-10-CM

## 2019-11-13 LAB — CUP PACEART REMOTE DEVICE CHECK
Battery Remaining Longevity: 61 mo
Battery Voltage: 2.97 V
Brady Statistic AP VP Percent: 48.45 %
Brady Statistic AP VS Percent: 0.04 %
Brady Statistic AS VP Percent: 51.05 %
Brady Statistic AS VS Percent: 0.46 %
Brady Statistic RA Percent Paced: 45.03 %
Brady Statistic RV Percent Paced: 97.82 %
Date Time Interrogation Session: 20210729033623
HighPow Impedance: 42 Ohm
HighPow Impedance: 53 Ohm
Implantable Lead Implant Date: 20111003
Implantable Lead Implant Date: 20111003
Implantable Lead Implant Date: 20130509
Implantable Lead Location: 753858
Implantable Lead Location: 753859
Implantable Lead Location: 753860
Implantable Lead Model: 4196
Implantable Lead Model: 5076
Implantable Lead Model: 6947
Implantable Pulse Generator Implant Date: 20191030
Lead Channel Impedance Value: 380 Ohm
Lead Channel Impedance Value: 380 Ohm
Lead Channel Impedance Value: 437 Ohm
Lead Channel Impedance Value: 513 Ohm
Lead Channel Impedance Value: 570 Ohm
Lead Channel Impedance Value: 646 Ohm
Lead Channel Pacing Threshold Amplitude: 0.5 V
Lead Channel Pacing Threshold Amplitude: 1 V
Lead Channel Pacing Threshold Amplitude: 2 V
Lead Channel Pacing Threshold Pulse Width: 0.4 ms
Lead Channel Pacing Threshold Pulse Width: 0.4 ms
Lead Channel Pacing Threshold Pulse Width: 0.4 ms
Lead Channel Sensing Intrinsic Amplitude: 1.5 mV
Lead Channel Sensing Intrinsic Amplitude: 1.5 mV
Lead Channel Sensing Intrinsic Amplitude: 19 mV
Lead Channel Sensing Intrinsic Amplitude: 19 mV
Lead Channel Setting Pacing Amplitude: 1.5 V
Lead Channel Setting Pacing Amplitude: 2 V
Lead Channel Setting Pacing Amplitude: 4.5 V
Lead Channel Setting Pacing Pulse Width: 0.4 ms
Lead Channel Setting Pacing Pulse Width: 0.4 ms
Lead Channel Setting Sensing Sensitivity: 0.3 mV

## 2019-11-17 ENCOUNTER — Ambulatory Visit (INDEPENDENT_AMBULATORY_CARE_PROVIDER_SITE_OTHER): Payer: Medicare HMO

## 2019-11-17 DIAGNOSIS — Z95810 Presence of automatic (implantable) cardiac defibrillator: Secondary | ICD-10-CM

## 2019-11-17 DIAGNOSIS — I5022 Chronic systolic (congestive) heart failure: Secondary | ICD-10-CM | POA: Diagnosis not present

## 2019-11-17 NOTE — Progress Notes (Signed)
Remote ICD transmission.   

## 2019-11-19 ENCOUNTER — Telehealth: Payer: Self-pay

## 2019-11-19 NOTE — Progress Notes (Signed)
EPIC Encounter for ICM Monitoring  Patient Name: [name] is a 79 y.o. male Date: 11/19/2019 Primary Care Physican: Mayra Neer, MD Primary Cardiologist:[place] Electrophysiologist: Allred Bi-V Pacing: 97.8% 09/10/2019 Weight: 180 lbs  Clinical Status (14-Oct-2019 to 13-Nov-2019)  Time in AT/AF <0.1 hr/day (<0.1%)  Longest AT/AF             2 mintues    Attempted call to patient and unable to reach.  Left message to return call regarding transmission. Transmission reviewed.   OptiVolThoracicimpedance normal.  Prescribed:Furosemide40 mgtake 2 tablets (80 mg total) daily.  Labs: 10/02/2019 Creatinine 1.66, BUN 33, Potassium 4.3, Sodium 137, GFR 39-45 09/18/2019 Creatinine 1.99, BUN 39, Potassium 3.9, Sodium 136, GFR 31-36  06/18/2019 Creatinine 1.63, BUN 26, Potassium 3.8, Sodium 137, GFR 40-47  A complete set of results can be found in Results Review.  Recommendations: Left voice mail with ICM number and encouraged to call if experiencing any fluid symptoms.  Follow-up plan: ICM clinic phone appointment on8/12/2019 (manual send) to recheck fluid levels. 91 day device clinic remote transmission7/29/2021.   EP/Cardiology Office Visits:  12/04/2019 with Dr Rayann Heman and 12/12/2019 with Dr. Aundra Dubin.    Copy of ICM check sent to Dr. Rayann Heman and Dr Aundra Dubin.    3 month ICM trend: 11/13/2019    1 Year ICM trend:       [name], RN 11/19/2019 2:13 PM

## 2019-11-19 NOTE — Telephone Encounter (Signed)
Remote ICM transmission received.  Attempted call to patient regarding ICM remote transmission and left detailed message per DPR to return call.  Advised to return call for any fluid symptoms or questions.      

## 2019-11-26 ENCOUNTER — Telehealth: Payer: Self-pay

## 2019-11-26 NOTE — Telephone Encounter (Signed)
I spoke with the pt wife and asked her to get the pt to send a manual transmission with his home monitor. She agreed.

## 2019-12-01 NOTE — Progress Notes (Signed)
No ICM remote transmission received for 11/24/2019 and next ICM transmission scheduled for 12/15/2019.

## 2019-12-03 ENCOUNTER — Other Ambulatory Visit: Payer: Self-pay | Admitting: Family Medicine

## 2019-12-03 DIAGNOSIS — E1165 Type 2 diabetes mellitus with hyperglycemia: Secondary | ICD-10-CM | POA: Diagnosis not present

## 2019-12-03 DIAGNOSIS — I5022 Chronic systolic (congestive) heart failure: Secondary | ICD-10-CM | POA: Diagnosis not present

## 2019-12-03 DIAGNOSIS — I209 Angina pectoris, unspecified: Secondary | ICD-10-CM | POA: Diagnosis not present

## 2019-12-03 DIAGNOSIS — N1831 Chronic kidney disease, stage 3a: Secondary | ICD-10-CM | POA: Diagnosis not present

## 2019-12-03 DIAGNOSIS — C44519 Basal cell carcinoma of skin of other part of trunk: Secondary | ICD-10-CM | POA: Diagnosis not present

## 2019-12-03 DIAGNOSIS — I130 Hypertensive heart and chronic kidney disease with heart failure and stage 1 through stage 4 chronic kidney disease, or unspecified chronic kidney disease: Secondary | ICD-10-CM | POA: Diagnosis not present

## 2019-12-03 DIAGNOSIS — I25119 Atherosclerotic heart disease of native coronary artery with unspecified angina pectoris: Secondary | ICD-10-CM | POA: Diagnosis not present

## 2019-12-03 DIAGNOSIS — E7800 Pure hypercholesterolemia, unspecified: Secondary | ICD-10-CM | POA: Diagnosis not present

## 2019-12-03 DIAGNOSIS — I255 Ischemic cardiomyopathy: Secondary | ICD-10-CM | POA: Diagnosis not present

## 2019-12-03 DIAGNOSIS — R69 Illness, unspecified: Secondary | ICD-10-CM | POA: Diagnosis not present

## 2019-12-03 DIAGNOSIS — Z0000 Encounter for general adult medical examination without abnormal findings: Secondary | ICD-10-CM | POA: Diagnosis not present

## 2019-12-04 ENCOUNTER — Ambulatory Visit: Payer: Medicare HMO | Admitting: Internal Medicine

## 2019-12-04 ENCOUNTER — Encounter: Payer: Self-pay | Admitting: Internal Medicine

## 2019-12-04 ENCOUNTER — Other Ambulatory Visit: Payer: Self-pay

## 2019-12-04 VITALS — BP 130/60 | HR 72 | Ht 69.0 in | Wt 185.0 lb

## 2019-12-04 DIAGNOSIS — I5022 Chronic systolic (congestive) heart failure: Secondary | ICD-10-CM | POA: Diagnosis not present

## 2019-12-04 DIAGNOSIS — I255 Ischemic cardiomyopathy: Secondary | ICD-10-CM | POA: Diagnosis not present

## 2019-12-04 DIAGNOSIS — Z95810 Presence of automatic (implantable) cardiac defibrillator: Secondary | ICD-10-CM

## 2019-12-04 DIAGNOSIS — I480 Paroxysmal atrial fibrillation: Secondary | ICD-10-CM | POA: Diagnosis not present

## 2019-12-04 DIAGNOSIS — I10 Essential (primary) hypertension: Secondary | ICD-10-CM

## 2019-12-04 LAB — CUP PACEART INCLINIC DEVICE CHECK
Battery Remaining Longevity: 60 mo
Battery Voltage: 2.96 V
Brady Statistic AP VP Percent: 39.11 %
Brady Statistic AP VS Percent: 0.04 %
Brady Statistic AS VP Percent: 60.29 %
Brady Statistic AS VS Percent: 0.56 %
Brady Statistic RA Percent Paced: 36.92 %
Brady Statistic RV Percent Paced: 97.8 %
Date Time Interrogation Session: 20210819171108
HighPow Impedance: 45 Ohm
HighPow Impedance: 59 Ohm
Implantable Lead Implant Date: 20111003
Implantable Lead Implant Date: 20111003
Implantable Lead Implant Date: 20130509
Implantable Lead Location: 753858
Implantable Lead Location: 753859
Implantable Lead Location: 753860
Implantable Lead Model: 4196
Implantable Lead Model: 5076
Implantable Lead Model: 6947
Implantable Pulse Generator Implant Date: 20191030
Lead Channel Impedance Value: 380 Ohm
Lead Channel Impedance Value: 399 Ohm
Lead Channel Impedance Value: 456 Ohm
Lead Channel Impedance Value: 570 Ohm
Lead Channel Impedance Value: 608 Ohm
Lead Channel Impedance Value: 703 Ohm
Lead Channel Pacing Threshold Amplitude: 0.625 V
Lead Channel Pacing Threshold Amplitude: 1 V
Lead Channel Pacing Threshold Amplitude: 2 V
Lead Channel Pacing Threshold Pulse Width: 0.4 ms
Lead Channel Pacing Threshold Pulse Width: 0.4 ms
Lead Channel Pacing Threshold Pulse Width: 0.4 ms
Lead Channel Sensing Intrinsic Amplitude: 0.875 mV
Lead Channel Sensing Intrinsic Amplitude: 1.5 mV
Lead Channel Sensing Intrinsic Amplitude: 31.625 mV
Lead Channel Sensing Intrinsic Amplitude: 31.625 mV
Lead Channel Setting Pacing Amplitude: 1.5 V
Lead Channel Setting Pacing Amplitude: 2 V
Lead Channel Setting Pacing Amplitude: 2.5 V
Lead Channel Setting Pacing Pulse Width: 0.4 ms
Lead Channel Setting Pacing Pulse Width: 0.4 ms
Lead Channel Setting Sensing Sensitivity: 0.3 mV

## 2019-12-04 NOTE — Progress Notes (Signed)
PCP: Mayra Neer, MD Primary Cardiologist: Dr Aundra Dubin Primary EP: Dr Mariane Duval [name] is a 79 y.o. male who presents today for routine electrophysiology followup.  Since last being seen in our clinic, the patient reports doing very well.  Today, he denies symptoms of palpitations, chest pain, shortness of breath,  lower extremity edema, dizziness, presyncope, syncope, or ICD shocks.  The patient is otherwise without complaint today.   Past Medical History:  Diagnosis Date  . CAD (coronary artery disease)    a. s/p CABG 1997 b. PCI (BMS) of SVG to RCA 3/09 c. redo CABG 02/2011 with SVG to PDA, SVG to Lcx d. Cath 07/2013: ischemic cardiomyopathy with LVEF less than 20%, occlusion of the SVG's placed in 2012 e.stenting of LM in 07/2015 with PCI of LCx performed as well  . CHF (congestive heart failure) ([place])   . Chronic systolic dysfunction of left ventricle    EF 30%  . Depression   . DJD (degenerative joint disease)   . DM (diabetes mellitus) ([place]) 02/14/2011  . GERD (gastroesophageal reflux disease)   . Gout   . HTN (hypertension) 02/14/2011  . Hyperlipemia   . Ischemic cardiomyopathy    s/p ICD Implantation by Dr Leonia Reeves  . Paroxysmal atrial fibrillation ([place]) 10/22/2014   single episode of AF x 3 hours 48 minutes recorded on ICD, chads2vasc score is at least 5   . Renal disorder    Past Surgical History:  Procedure Laterality Date  . BI-VENTRICULAR IMPLANTABLE CARDIOVERTER DEFIBRILLATOR UPGRADE N/A 08/24/2011   Procedure: BI-VENTRICULAR IMPLANTABLE CARDIOVERTER DEFIBRILLATOR UPGRADE;  Surgeon: Evans Lance, MD;  Location: [facility] CATH LAB;  Service: Cardiovascular;  Laterality: N/A;  . BIV ICD GENERATOR CHANGEOUT N/A 02/13/2018   Procedure: BIV ICD GENERATOR CHANGEOUT;  Surgeon: Thompson Grayer, MD;  Location: [place] CV LAB;  Service: Cardiovascular;  Laterality: N/A;  . CARDIAC CATHETERIZATION  08/03/2015   Procedure: Left Heart Cath and Cors/Grafts Angiography;  Surgeon:  Peter M Martinique, MD;  Location: [place] CV LAB;  Service: Cardiovascular;;  . CARDIAC CATHETERIZATION N/A 08/06/2015   Procedure: Coronary Stent Intervention w/Impella;  Surgeon: Jettie Booze, MD;  Location: [place] CV LAB;  Service: Cardiovascular;  Laterality: N/A;  . CARDIAC CATHETERIZATION  08/06/2015   Procedure: Left Heart Cath;  Surgeon: Jettie Booze, MD;  Location: [place] CV LAB;  Service: Cardiovascular;;  . CARDIAC CATHETERIZATION  08/06/2015   Procedure: Coronary Balloon Angioplasty;  Surgeon: Jettie Booze, MD;  Location: [place] CV LAB;  Service: Cardiovascular;;  . CARDIAC DEFIBRILLATOR PLACEMENT  08/2004   initial placement, upgraded to [place] ICD by Dr Lovena Le 08/24/11 (MDT)  . CARDIOVERSION N/A 09/26/2019   Procedure: CARDIOVERSION;  Surgeon: Larey Dresser, MD;  Location: [facility] ENDOSCOPY;  Service: Cardiovascular;  Laterality: N/A;  . CATARACT EXTRACTION W/ INTRAOCULAR LENS  IMPLANT, BILATERAL  2012  . CHOLECYSTECTOMY  01/05/2012   Procedure: LAPAROSCOPIC CHOLECYSTECTOMY WITH INTRAOPERATIVE CHOLANGIOGRAM;  Surgeon: Joyice Faster. Cornett, MD;  Location: [place];  Service: General;  Laterality: N/A;  laparoscopic cholecysectoym with intraoperative cholangiogram  . COLONOSCOPY WITH PROPOFOL N/A 11/18/2013   Procedure: COLONOSCOPY WITH PROPOFOL;  Surgeon: Garlan Fair, MD;  Location: [facility] ENDOSCOPY;  Service: Endoscopy;  Laterality: N/A;  . CORONARY ANGIOPLASTY WITH STENT PLACEMENT  06/2007   BMS to SVG to RCA  . CORONARY ARTERY BYPASS GRAFT  01/1996   CABG x 5 LIMA to LAD SVG to diag1,2,svg to om,svg to RCA  .  CORONARY ARTERY BYPASS GRAFT  03/06/2011   CABG X2; Procedure: REDO CORONARY ARTERY BYPASS GRAFTING (CABG);  Surgeon: Grace Isaac, MD;  Location: [place];  Service: Open Heart Surgery;  Laterality: N/A;  times two grafts using right saphenous vein harvested endoscopically.  Marland Kitchen KNEE ARTHROTOMY  ~ 1978   RIGHT KNEE CARTILAGE REMOVED  . LEFT HEART  CATHETERIZATION WITH CORONARY ANGIOGRAM N/A 06/06/2011   Procedure: LEFT HEART CATHETERIZATION WITH CORONARY ANGIOGRAM;  Surgeon: Sueanne Margarita, MD;  Location: [place] CATH LAB;  Service: Cardiovascular;  Laterality: N/A;  . LEFT HEART CATHETERIZATION WITH CORONARY/GRAFT ANGIOGRAM N/A 08/08/2013   Procedure: LEFT HEART CATHETERIZATION WITH Beatrix Fetters;  Surgeon: Sinclair Grooms, MD;  Location: [facility] CATH LAB;  Service: Cardiovascular;  Laterality: N/A;  . LUMBAR [place] SURGERY  2003  . RIGHT HEART CATHETERIZATION N/A 02/17/2014   Procedure: RIGHT HEART CATH;  Surgeon: Larey Dresser, MD;  [facility] LAB;  Service: Cardiovascular;  Laterality: N/A;  . TEE WITHOUT CARDIOVERSION N/A 09/26/2019   Procedure: TRANSESOPHAGEAL ECHOCARDIOGRAM (TEE);  Surgeon: Larey Dresser, MD;  Location: [facility] ENDOSCOPY;  Service: Cardiovascular;  Laterality: N/A;  . VENOGRAM N/A 06/08/2011   Procedure: VENOGRAM;  Surgeon: Thompson Grayer, MD;  Location: [facility] CATH LAB;  Service: Cardiovascular;  Laterality: N/A;    ROS- all systems are reviewed and negative except as per HPI above  Current Outpatient Medications  Medication Sig Dispense Refill  . acetaminophen (TYLENOL) 500 MG tablet Take 1,000 mg by mouth every 6 (six) hours as needed for moderate pain.    Marland Kitchen allopurinol (ZYLOPRIM) 100 MG tablet Take 200 mg by mouth daily.     . Aromatic Inhalants (VICKS VAPOINHALER) INHA Inhale 1 puff into the lungs daily as needed (congestion).    Marland Kitchen atorvastatin (LIPITOR) 80 MG tablet TAKE 1 TABLET BY MOUTH ONCE DAILY AT  6PM  IN  THE  EVENING 90 tablet 3  . carvedilol (COREG) 6.25 MG tablet Take 1 tablet by mouth twice daily 180 tablet 3  . Continuous Blood Gluc Sensor (FREESTYLE LIBRE 14 DAY SENSOR) MISC USE TO CHECK GLUCOSE THREE TIMES DAILY AS DIRECTED    . ELIQUIS 5 MG TABS tablet Take 1 tablet by mouth twice daily 60 tablet 5  . eplerenone (INSPRA) 50 MG tablet Take 1 tablet (50 mg total) by mouth at bedtime. 30 tablet 5    . FARXIGA 5 MG TABS tablet Take 5 mg by mouth daily.    . furosemide (LASIX) 40 MG tablet Take 2 tablets (80 mg total) by mouth daily. 270 tablet 3  . Homeopathic Products [facility] ALLERGY EYE RELIEF OP) Place 1 drop into both eyes daily as needed (irritation).    . Insulin Isophane & Regular Human (NOVOLIN 70/30 FLEXPEN) (70-30) 100 UNIT/ML PEN Inject 40 Units into the skin daily before lunch.     . loperamide (IMODIUM A-D) 2 MG tablet Take 2 mg by mouth 4 (four) times daily as needed for diarrhea or loose stools.    Marland Kitchen losartan (COZAAR) 25 MG tablet Take 0.5 tablets (12.5 mg total) by mouth at bedtime. 45 tablet 3  . nitroGLYCERIN (NITROSTAT) 0.4 MG SL tablet Place 1 tablet (0.4 mg total) under the tongue every 5 (five) minutes as needed for chest pain. DO NOT EXCEED A TOTAL OF 3 DOSES IN 15 MINUTES 25 tablet 6  . QUEtiapine (SEROQUEL) 100 MG tablet Take 50-100 mg by mouth at bedtime.   3  . ranolazine (RANEXA) 1000 MG  SR tablet Take 1,000 mg by mouth at bedtime.     . triamcinolone (KENALOG) 0.147 MG/GM topical spray Apply 1 spray topically See admin instructions. Use after each shower    . triamcinolone cream (KENALOG) 0.1 % Apply 1 application topically 2 (two) times daily as needed (for dry/irritated skin.).    Marland Kitchen venlafaxine (EFFEXOR-XR) 150 MG 24 hr capsule Take 150 mg by mouth daily with breakfast.     . vitamin B-12 (CYANOCOBALAMIN) 1000 MCG tablet Take 1,000 mcg by mouth daily.     No current facility-administered medications for this visit.    Physical Exam: Vitals:   12/04/19 1530  BP: 130/60  Pulse: 72  SpO2: 94%  Weight: 185 lb (83.9 kg)  Height: 5\' 9"  (1.753 m)    GEN- The patient is well appearing, alert and oriented x 3 today.   Head- normocephalic, atraumatic Eyes-  Sclera clear, conjunctiva pink Ears- hearing intact Oropharynx- clear Lungs-  normal work of breathing Chest- ICD pocket is well healed Heart- Regular rate and rhythm  GI- soft  Extremities- no  clubbing, cyanosis, or edema  ICD interrogation- reviewed in detail today,  See PACEART report  ekg tracing ordered today is personally reviewed and shows sinus, BiV paced  Wt Readings from Last 3 Encounters:  12/04/19 185 lb (83.9 kg)  10/29/19 179 lb 9.6 oz (81.5 kg)  10/14/19 179 lb 9.6 oz (81.5 kg)    Assessment and Plan:  1.  Chronic systolic dysfunction/ CAD/ ischemic CM euvolemic today Stable on an appropriate medical regimen Normal ICD function See [place] report No changes today he is not device dependant today  followed in ICM device clinic  2. Persistent atrial fibrillation afib burden is <0.1% He did have persistent afib in May He is on eliquis  3. HTN Stable No change required today  4. CRI, stage IIIb Stable No change required today  5. HL Continue statin  Return in a year to see EP PA  Risks, benefits and potential toxicities for medications prescribed and/or refilled reviewed with patient today.   Thompson Grayer MD, [facility] 12/04/2019 3:58 PM

## 2019-12-04 NOTE — Patient Instructions (Addendum)
Medication Instructions:  Your physician recommends that you continue on your current medications as directed. Please refer to the Current Medication list given to you today.  Labwork: None ordered.  Testing/Procedures: None ordered.  Follow-Up: Your physician wants you to follow-up in: one year with [name], PA.   You will receive a reminder letter in the mail two months in advance. If you don't receive a letter, please call our office to schedule the follow-up appointment.  Remote monitoring is used to monitor your ICD from home. This monitoring reduces the number of office visits required to check your device to one time per year. It allows [place] to keep an eye on the functioning of your device to ensure it is working properly. You are scheduled for a device check from home on 12/15/2019. You may send your transmission at any time that day. If you have a wireless device, the transmission will be sent automatically. After your physician reviews your transmission, you will receive a postcard with your next transmission date.  Any Other Special Instructions Will Be Listed Below (If Applicable).  If you need a refill on your cardiac medications before your next appointment, please call your pharmacy.

## 2019-12-12 ENCOUNTER — Other Ambulatory Visit: Payer: Self-pay

## 2019-12-12 ENCOUNTER — Encounter (HOSPITAL_COMMUNITY): Payer: Self-pay | Admitting: Cardiology

## 2019-12-12 ENCOUNTER — Ambulatory Visit (HOSPITAL_COMMUNITY)
Admission: RE | Admit: 2019-12-12 | Discharge: 2019-12-12 | Disposition: A | Discharge status: Home / Self Care | Payer: Medicare HMO | Source: Ambulatory Visit | Attending: Cardiology | Admitting: Cardiology

## 2019-12-12 VITALS — BP 108/60 | HR 70 | Wt 182.0 lb

## 2019-12-12 DIAGNOSIS — E1122 Type 2 diabetes mellitus with diabetic chronic kidney disease: Secondary | ICD-10-CM | POA: Diagnosis not present

## 2019-12-12 DIAGNOSIS — Z87891 Personal history of nicotine dependence: Secondary | ICD-10-CM | POA: Insufficient documentation

## 2019-12-12 DIAGNOSIS — E785 Hyperlipidemia, unspecified: Secondary | ICD-10-CM | POA: Diagnosis not present

## 2019-12-12 DIAGNOSIS — Z955 Presence of coronary angioplasty implant and graft: Secondary | ICD-10-CM | POA: Diagnosis not present

## 2019-12-12 DIAGNOSIS — F329 Major depressive disorder, single episode, unspecified: Secondary | ICD-10-CM | POA: Diagnosis not present

## 2019-12-12 DIAGNOSIS — M1990 Unspecified osteoarthritis, unspecified site: Secondary | ICD-10-CM | POA: Diagnosis not present

## 2019-12-12 DIAGNOSIS — I480 Paroxysmal atrial fibrillation: Secondary | ICD-10-CM | POA: Diagnosis not present

## 2019-12-12 DIAGNOSIS — Z79899 Other long term (current) drug therapy: Secondary | ICD-10-CM | POA: Diagnosis not present

## 2019-12-12 DIAGNOSIS — Z794 Long term (current) use of insulin: Secondary | ICD-10-CM | POA: Insufficient documentation

## 2019-12-12 DIAGNOSIS — Z8249 Family history of ischemic heart disease and other diseases of the circulatory system: Secondary | ICD-10-CM | POA: Insufficient documentation

## 2019-12-12 DIAGNOSIS — Z9049 Acquired absence of other specified parts of digestive tract: Secondary | ICD-10-CM | POA: Insufficient documentation

## 2019-12-12 DIAGNOSIS — N183 Chronic kidney disease, stage 3 unspecified: Secondary | ICD-10-CM | POA: Diagnosis not present

## 2019-12-12 DIAGNOSIS — Z95810 Presence of automatic (implantable) cardiac defibrillator: Secondary | ICD-10-CM | POA: Insufficient documentation

## 2019-12-12 DIAGNOSIS — K219 Gastro-esophageal reflux disease without esophagitis: Secondary | ICD-10-CM | POA: Diagnosis not present

## 2019-12-12 DIAGNOSIS — I2510 Atherosclerotic heart disease of native coronary artery without angina pectoris: Secondary | ICD-10-CM | POA: Diagnosis not present

## 2019-12-12 DIAGNOSIS — I252 Old myocardial infarction: Secondary | ICD-10-CM | POA: Insufficient documentation

## 2019-12-12 DIAGNOSIS — Z951 Presence of aortocoronary bypass graft: Secondary | ICD-10-CM | POA: Diagnosis not present

## 2019-12-12 DIAGNOSIS — M109 Gout, unspecified: Secondary | ICD-10-CM | POA: Insufficient documentation

## 2019-12-12 DIAGNOSIS — Z7902 Long term (current) use of antithrombotics/antiplatelets: Secondary | ICD-10-CM | POA: Diagnosis not present

## 2019-12-12 DIAGNOSIS — I5022 Chronic systolic (congestive) heart failure: Secondary | ICD-10-CM | POA: Insufficient documentation

## 2019-12-12 DIAGNOSIS — I255 Ischemic cardiomyopathy: Secondary | ICD-10-CM | POA: Insufficient documentation

## 2019-12-12 DIAGNOSIS — Z7901 Long term (current) use of anticoagulants: Secondary | ICD-10-CM | POA: Insufficient documentation

## 2019-12-12 NOTE — Patient Instructions (Signed)
Follow up in 3 months

## 2019-12-13 NOTE — Progress Notes (Signed)
Advanced Heart Failure Clinic Note   Patient ID: [name], male   DOB: [birth year], 79 y.o.   MRN: [id number] PCP: Dr. Lynnda Child Cardiology: Dr Aundra Dubin  89 y.o. with history of CAD s/p CABG and redo CABG as well as ischemic cardiomyopathy with CRT-D device presents for followup of CHF and CAD.  He had a Cardiolite in the 4/15 showing lateral wall ischemia.  LHC at that time showed all his vein grafts from both CABG surgeries occluded.  The LIMA-LAD was patent and there was a 90% distal LM stenosis.  The lateral ischemia likely corresponded to LCx territory downstream from the LM stenosis.  PCI of the distal LM was thought to be high risk and characteristics of the lesion were not favorable for PCI.  Last echo showed EF 25-30% with moderate RV systolic dysfunction.  He had RHC in 11/15 with normal filling pressures and relatively preserved cardiac index (2.55).    In 4/17, he was admitted with chest pain concerning for unstable angina.  Angiography showed 85% distal LM with 90% ostial LCx stenosis.  LIMA was patent but proximal LAD, proximal RCA, and all SVGs were totally occluded. High had successful DES to LM and PTCA to ostial LCx with Impella support.  [name] was stopped and he was put back on low dose lisinopril due to symptomatic hypotension.  He had Cardiolite in 11/17 with scar, no ischemia.   He was admitted in 1/19 with AKI, creatinine up to 3. Meds held then restarted.   Echo 3/19 with EF 30-35%.   He was admitted with syncope and AKI in 6/19.  He was thought to be dehydrated and orthostatic. Losartan stopped but eventually restarted.   He was noted to have prolonged atrial fibrillation by device check in 3/20, now on Eliquis.   Echo was done in 6/20, showing EF 25-30% with inferior/inferolateral AK, moderate LV dilation, mild LVH, mild MR, severe LAE.    CPX in 11/20 showed moderate functional limitation due to HF.   He went into atrial fibrillation in 6/21, had TEE-guided DCCV  in 6/21 back to NSR.  TEE showed EF 25-30%, moderately dilated LV, normal RV.   He returns for followup of CHF and CAD.  He is in NSR today.  No palpitations.  Lasix was decreased to 80 mg daily, and he feels less dizzy. No longer lightheaded with standing.  Weight up 3 lbs.  No chest pain.  No dyspnea walking on flat ground.  No orthopnea/PND.   ECG (personally reviewed): NSR, BiV pacing  Labs (9/15): LDL particle number 816, LDL 57, LFTs normal Labs (10/15): K 4.4, creatinine 1.4 Labs (11/15): K 4.3, creatinine 1.36 Labs (12/15): K 4.8, creatinine 1.34 Labs (3/16): K 4, creatinine 1.38, LDL 46, HDl 35 Labs (7/16): K 4, creatinine 1.39, BNP 211 Labs (03/03/15): K 4.1, creatinine 1.47, BNP 227, LDL 80, HDL 32 Labs (12/16): K 4.6, creatinine 1.12, LDL 68, HDL 34 Labs (4/17): K 3.4, creatinine 1.15 Labs (6/17): K 3.8, creatinine 1.67 Labs (7/17): K 4, creatinine 1.35 Labs (10/17): K 3.9, creatinine 1.31, LDL 45, HDL 30 Labs (2/18): K 4, creatinine 1.43 Labs (5/18): LDL 44, HDL 27 Labs (6/18): K 4.1, creatinine 1.49 Labs (12/18): K 4.4, creatinine 1.44 Labs (1/19): K 4.3, creatinine 1.48 Labs (2/19): K 3.7, creatinine 1.6 Labs (6/19): K 4, creatinine 1.5 Labs (9/19): K 4.4, creatinine 1.6, hgb 13.4 Labs (10/19): K 4.3, creatinine 1.44 Labs (1/20): K 4, creatinine 1.65 Labs (6/20): K 3.9, creatinine  1.58, LDL 58, HDL 31, hgb 13.3 Labs (7/20): K 3.9, creatinine 1.5 Labs (10/20): K 4, creatinine 1.7 Labs (12/20): K 3.9, creatinine 1.56 Labs (5/21): K 4.1, creatinine 1.7 Labs (8/21): K 4.4, creatinine 1.66, LDL 57  PMH: 1. Gout 2. Hyperlipidemia 3. CAD: CABG 1997 and redo 11/12.   - LHC (4/15) with totally occluded LAD, totally occluded RCA, 80% distal LM, 50% mLCx, 2 SVG-RCA grafts totally occluded, 2 SVG-OM grafts totally occluded, patent LIMA-LAD.  Cardiolite prior to 4/15 cath showed lateral wall ischemia (LCx territory).  PCI to distal LM would be a high risk procedure.   -  Cardiolite (8/16) with EF 20%, severe scar in RCA and probably LCx territory, minimal peri-infarct ischemia.   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  - Unstable angina 4/17: 85% distal LM with 90% ostial LCx stenosis.  LIMA was patent but proximal LAD, proximal RCA, and all SVGs were totally occluded. He had successful DES to LM and PTCA to ostial LCx with Impella support  - Cardiolite (11/17): EF 20%, prior MI, no ischemia.  4. Ischemic Cardiomyopathy: Medtronic CRT-D device.   - Echo (6/15) with EF 25-30%, moderate LV dilation, inferior and inferolateral akinesis, moderately decreased RV systolic function, mild MR.   - RHC (11/15) with mean RA 5, PA 23/6, mean PCWP 9, CI 2.55.  - Echo (4/17): EF 25-30% with mild MR.  - Echo (11/17): EF 25-30%, moderately dilated LV, grade II diastolic dysfunction, mild-moderate MR, mildly decreased RV systolic function.  - Echo (3/19): EF 30-35%, inferior/inferolateral akinesis, normal RV size with mildly decreased systolic function.  - Echo (6/20): EF 25-30% with inferior/inferolateral AK, moderate LV dilation, mild LVH, mild MR, severe LAE. - CPX (11/20): VO2 max 14.9 (60% predicted), VE/VCO2 slope 34, RER 1.05.  Moderate functional deficit due to HF.  - TEE (6/21): EF 25-30%, moderately dilated LV, normal RV. 5. H/o cholecystectomy 6. OA 7. Depression 8. Type II diabetes 9. GERD 10. CKD stage 3 11. ?Early dementia 12. Atrial fibrillation: Paroxysmal.  - DCCV to NSR in 6/21.  13. B12 deficiency.   SH: Married, prior smoker (many years ago), lives in [place].    FH: CAD  ROS: All systems reviewed and negative except as per HPI.   Current Outpatient Medications  Medication Sig Dispense Refill  . acetaminophen (TYLENOL) 500 MG tablet Take 1,000 mg by mouth every 6 (six) hours as needed for moderate pain.    Marland Kitchen allopurinol (ZYLOPRIM) 100 MG tablet Take 200 mg by mouth daily.     . Aromatic Inhalants (VICKS  VAPOINHALER) INHA Inhale 1 puff into the lungs daily as needed (congestion).    Marland Kitchen atorvastatin (LIPITOR) 80 MG tablet TAKE 1 TABLET BY MOUTH ONCE DAILY AT  6PM  IN  THE  EVENING 90 tablet 3  . carvedilol (COREG) 6.25 MG tablet Take 1 tablet by mouth twice daily 180 tablet 3  . Continuous Blood Gluc Sensor (FREESTYLE LIBRE 14 DAY SENSOR) MISC USE TO CHECK GLUCOSE THREE TIMES DAILY AS DIRECTED    . ELIQUIS 5 MG TABS tablet Take 1 tablet by mouth twice daily 60 tablet 5  . eplerenone (INSPRA) 50 MG tablet Take 1 tablet (50 mg total) by mouth at bedtime. 30 tablet 5  . FARXIGA 5 MG TABS tablet Take 5 mg by mouth daily.    . furosemide (LASIX) 40 MG tablet Take 2 tablets (80 mg total) by mouth daily. 270 tablet 3  . Homeopathic  Products [facility] ALLERGY EYE RELIEF OP) Place 1 drop into both eyes daily as needed (irritation).    . Insulin Isophane & Regular Human (NOVOLIN 70/30 FLEXPEN) (70-30) 100 UNIT/ML PEN Inject 40 Units into the skin daily before lunch.     . loperamide (IMODIUM A-D) 2 MG tablet Take 2 mg by mouth 4 (four) times daily as needed for diarrhea or loose stools.    Marland Kitchen losartan (COZAAR) 25 MG tablet Take 0.5 tablets (12.5 mg total) by mouth at bedtime. 45 tablet 3  . nitroGLYCERIN (NITROSTAT) 0.4 MG SL tablet Place 1 tablet (0.4 mg total) under the tongue every 5 (five) minutes as needed for chest pain. DO NOT EXCEED A TOTAL OF 3 DOSES IN 15 MINUTES 25 tablet 6  . QUEtiapine (SEROQUEL) 100 MG tablet Take 50-100 mg by mouth at bedtime.   3  . ranolazine (RANEXA) 1000 MG SR tablet Take 1,000 mg by mouth at bedtime.     . triamcinolone (KENALOG) 0.147 MG/GM topical spray Apply 1 spray topically See admin instructions. Use after each shower    . triamcinolone cream (KENALOG) 0.1 % Apply 1 application topically 2 (two) times daily as needed (for dry/irritated skin.).    Marland Kitchen venlafaxine (EFFEXOR-XR) 150 MG 24 hr capsule Take 150 mg by mouth daily with breakfast.     . vitamin B-12  (CYANOCOBALAMIN) 1000 MCG tablet Take 1,000 mcg by mouth daily.     No current facility-administered medications for this encounter.   BP 108/60   Pulse 70   Wt 82.6 kg (182 lb)   SpO2 95%   BMI 26.88 kg/m    Wt Readings from Last 3 Encounters:  12/12/19 82.6 kg (182 lb)  12/04/19 83.9 kg (185 lb)  10/29/19 81.5 kg (179 lb 9.6 oz)    General: NAD Neck: No JVD, no thyromegaly or thyroid nodule.  Lungs: Clear to auscultation bilaterally with normal respiratory effort. CV: Nondisplaced PMI.  Heart regular S1/S2, no S3/S4, no murmur.  No peripheral edema.  No carotid bruit.  Normal pedal pulses.  Abdomen: Soft, nontender, no hepatosplenomegaly, no distention.  Skin: Intact without lesions or rashes.  Neurologic: Alert and oriented x 3.  Psych: Normal affect. Extremities: No clubbing or cyanosis.  HEENT: Normal.   Assessment/Plan: 1. Chronic systolic CHF: Ischemic cardiomyopathy.  Echo in 6/20 showed EF 25-30%. He has a Medtronic CRT-D device.  CPX in 11/20 showed moderate functional limitation due to CHF.  TEE in 6/21 with EF 25-30%, normal RV.  He is not volume overloaded on exam. NYHA class II-III symptoms.  - Continue Coreg 6.25 mg bid.  - Continue Lasix 80 mg daily. Recent BMET stable.      - Continue eplerenone 50 mg daily.   - BP did not tolerate Entresto.  Had ACEI cough. Continue losartan 12.5 mg daily.   - Continue dapagliflozin 5 mg daily. - With history of orthostatic symptoms, I will not be able to titrate up his current cardiomyopathy meds.  2. CAD: s/p CABG and redo. Had DES to distal LM, angioplasty to ostial LCx in 4/17. Cardiolite 11/17 with scar, no ischemia.  No recent chest pain. - Continue statin.  - He is off ASA/Plavix and taking apixaban 5 mg bid.  - Continue Coreg and ranolazine 1000 mg bid.  3. CKD stage III: Creatinine stable on recent BMET.   4. Hyperlipidemia: Continue statin. Good lipids in 8/21.  5. Atrial fibrillation: Paroxysmal atrial  fibrillation.  - Continue apixaban.  Followup in 3 months.   [name] 12/13/2019

## 2019-12-15 ENCOUNTER — Ambulatory Visit (INDEPENDENT_AMBULATORY_CARE_PROVIDER_SITE_OTHER): Payer: Medicare HMO

## 2019-12-15 DIAGNOSIS — Z95810 Presence of automatic (implantable) cardiac defibrillator: Secondary | ICD-10-CM

## 2019-12-15 DIAGNOSIS — I5022 Chronic systolic (congestive) heart failure: Secondary | ICD-10-CM

## 2019-12-17 NOTE — Progress Notes (Signed)
EPIC Encounter for ICM Monitoring  Patient Name: [name] is a 79 y.o. male Date: 12/17/2019 Primary Care Physican: Mayra Neer, MD Primary Cardiologist:McLean Electrophysiologist: Allred Bi-V Pacing: 97.8% 12/12/2019 Office Weight: 182 lbs  Time in AT/AF 0.0 hr/day (0.0%) (taking Eliquis)   Transmission reviewed.  OptiVolThoracicimpedance normal.  Prescribed:Furosemide40 mgtake 2 tablets (80 mg total)daily.  Labs: 12/03/2019 Creatinine 1.66, BUN 30, Potassium 4.4, Sodium 139, GFR 40-49 10/29/2019 Creatinine 1.75, BUN 32, Potassium 4.1, Sodium 133, GFR 37-43 10/02/2019 Creatinine1.66, BUN33, Potassium4.3, [id number], [id number] 09/18/2019 Creatinine1.99, BUN39, Potassium3.9, Sodium136, [id number]  06/18/2019 Creatinine1.63, BUN26, Potassium3.8, [id number], [id number] A complete set of results can be found in Results Review.  Recommendations: No changes.  Follow-up plan: ICM clinic phone appointment on9/13/2021. 91 day device clinic remote transmission10/28/2021.   EP/Cardiology Office Visits: 03/16/2020 with Dr.McLean.12/02/2020 with [name], PA.  Copy of ICM check sent to Dr.Allred.    3 month ICM trend: 12/15/2019    1 Year ICM trend:       [name], RN 12/17/2019 8:46 AM

## 2019-12-25 DIAGNOSIS — Z08 Encounter for follow-up examination after completed treatment for malignant neoplasm: Secondary | ICD-10-CM | POA: Diagnosis not present

## 2019-12-25 DIAGNOSIS — L820 Inflamed seborrheic keratosis: Secondary | ICD-10-CM | POA: Diagnosis not present

## 2019-12-25 DIAGNOSIS — D225 Melanocytic nevi of trunk: Secondary | ICD-10-CM | POA: Diagnosis not present

## 2019-12-25 DIAGNOSIS — Z1283 Encounter for screening for malignant neoplasm of skin: Secondary | ICD-10-CM | POA: Diagnosis not present

## 2019-12-25 DIAGNOSIS — Z85828 Personal history of other malignant neoplasm of skin: Secondary | ICD-10-CM | POA: Diagnosis not present

## 2019-12-29 ENCOUNTER — Other Ambulatory Visit (HOSPITAL_COMMUNITY): Payer: Self-pay | Admitting: Cardiology

## 2020-01-02 ENCOUNTER — Telehealth: Payer: Self-pay

## 2020-01-02 ENCOUNTER — Ambulatory Visit (INDEPENDENT_AMBULATORY_CARE_PROVIDER_SITE_OTHER): Payer: Medicare HMO

## 2020-01-02 DIAGNOSIS — I5022 Chronic systolic (congestive) heart failure: Secondary | ICD-10-CM

## 2020-01-02 DIAGNOSIS — Z95810 Presence of automatic (implantable) cardiac defibrillator: Secondary | ICD-10-CM

## 2020-01-02 NOTE — Telephone Encounter (Signed)
Remote ICM transmission received.  Attempted call to wife/patient regarding ICM remote transmission and no answer.  

## 2020-01-02 NOTE — Progress Notes (Signed)
EPIC Encounter for ICM Monitoring  Patient Name: [name] is a 79 y.o. male Date: 01/02/2020 Primary Care Physican: Mayra Neer, MD Primary Cardiologist:McLean Electrophysiologist: Allred Bi-V Pacing: 97.7% 12/12/2019 Office Weight: 182 lbs  Time in AT/AF 0.0 hr/day (0.0%) (taking Eliquis)   Attempted call to patient and unable to reach.  Transmission reviewed.   OptiVolThoracicimpedance normal.  Prescribed:Furosemide40 mgtake 2 tablets (80 mg total)daily.  Labs: 12/03/2019 Creatinine 1.66, BUN 30, Potassium 4.4, Sodium 139, GFR 40-49 10/29/2019 Creatinine 1.75, BUN 32, Potassium 4.1, Sodium 133, GFR 37-43 10/02/2019 Creatinine1.66, BUN33, Potassium4.3, [id number], [id number] 09/18/2019 Creatinine1.99, BUN39, Potassium3.9, Sodium136, ELM76-15  06/18/2019 Creatinine1.63, BUN26, Potassium3.8, [id number], [id number] A complete set of results can be found in Results Review.  Recommendations: Unable to reach.    Follow-up plan: ICM clinic phone appointment on10/18/2021. 91 day device clinic remote transmission10/28/2021.   EP/Cardiology Office Visits:03/16/2020 with Dr.McLean.12/02/2020 with [name], PA.  Copy of ICM check sent to Dr.Allred.  3 month ICM trend: 01/02/2020    1 Year ICM trend:       [name], RN 01/02/2020 4:46 PM

## 2020-01-05 IMAGING — CR DG CHEST 2V
2 series · 2 of 2 positions shown · non-contrast
Comparison: 05/01/2017, 08/01/2015 and earlier.

CLINICAL DATA: One-week history of productive cough, shortness of
breath, chest congestion. Former smoker quit 40 years ago. Current
history of hypertension and diabetes. Indwelling pacing
biventricular defibrillator.

EXAM:
CHEST  2 VIEW

[chest pa]
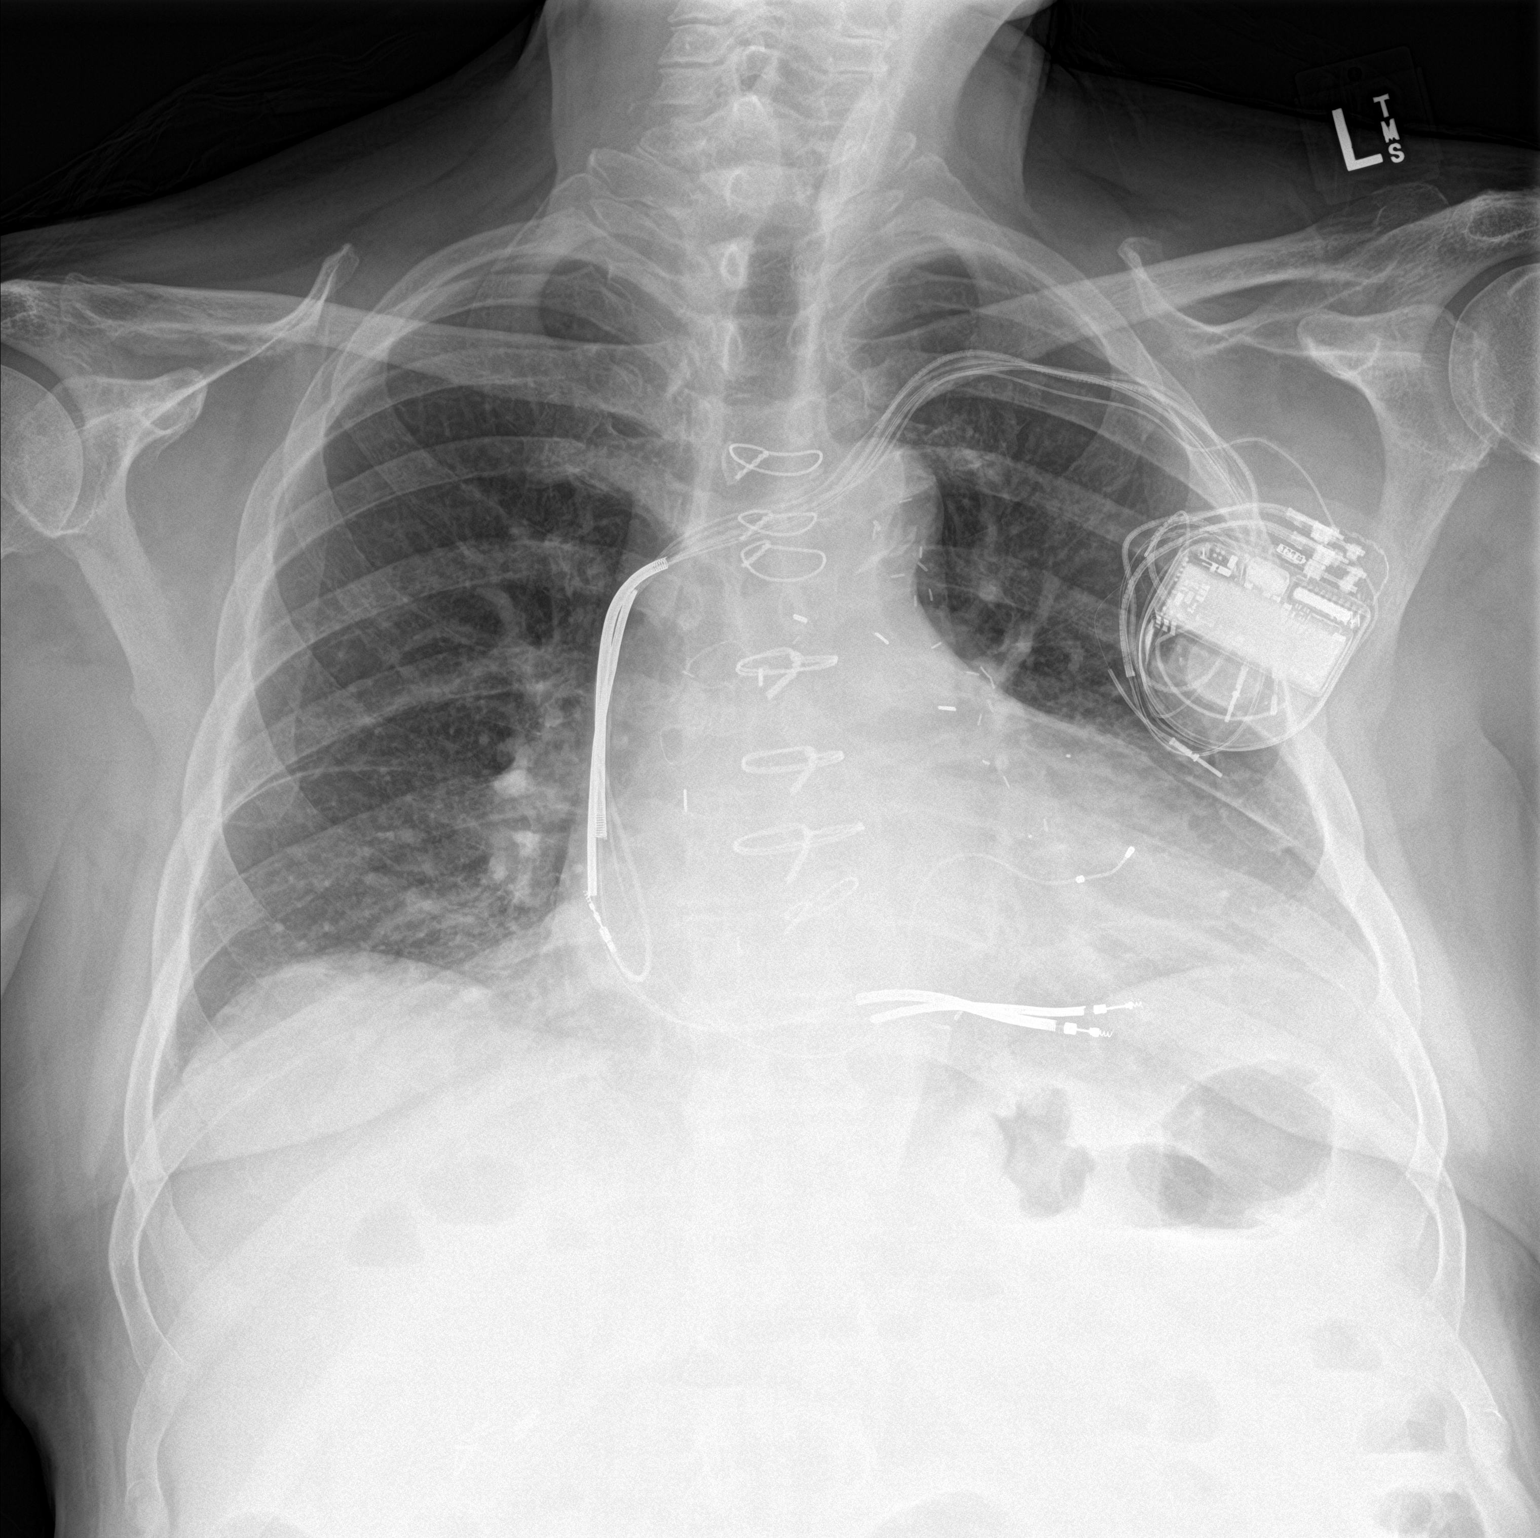

[chest lat]
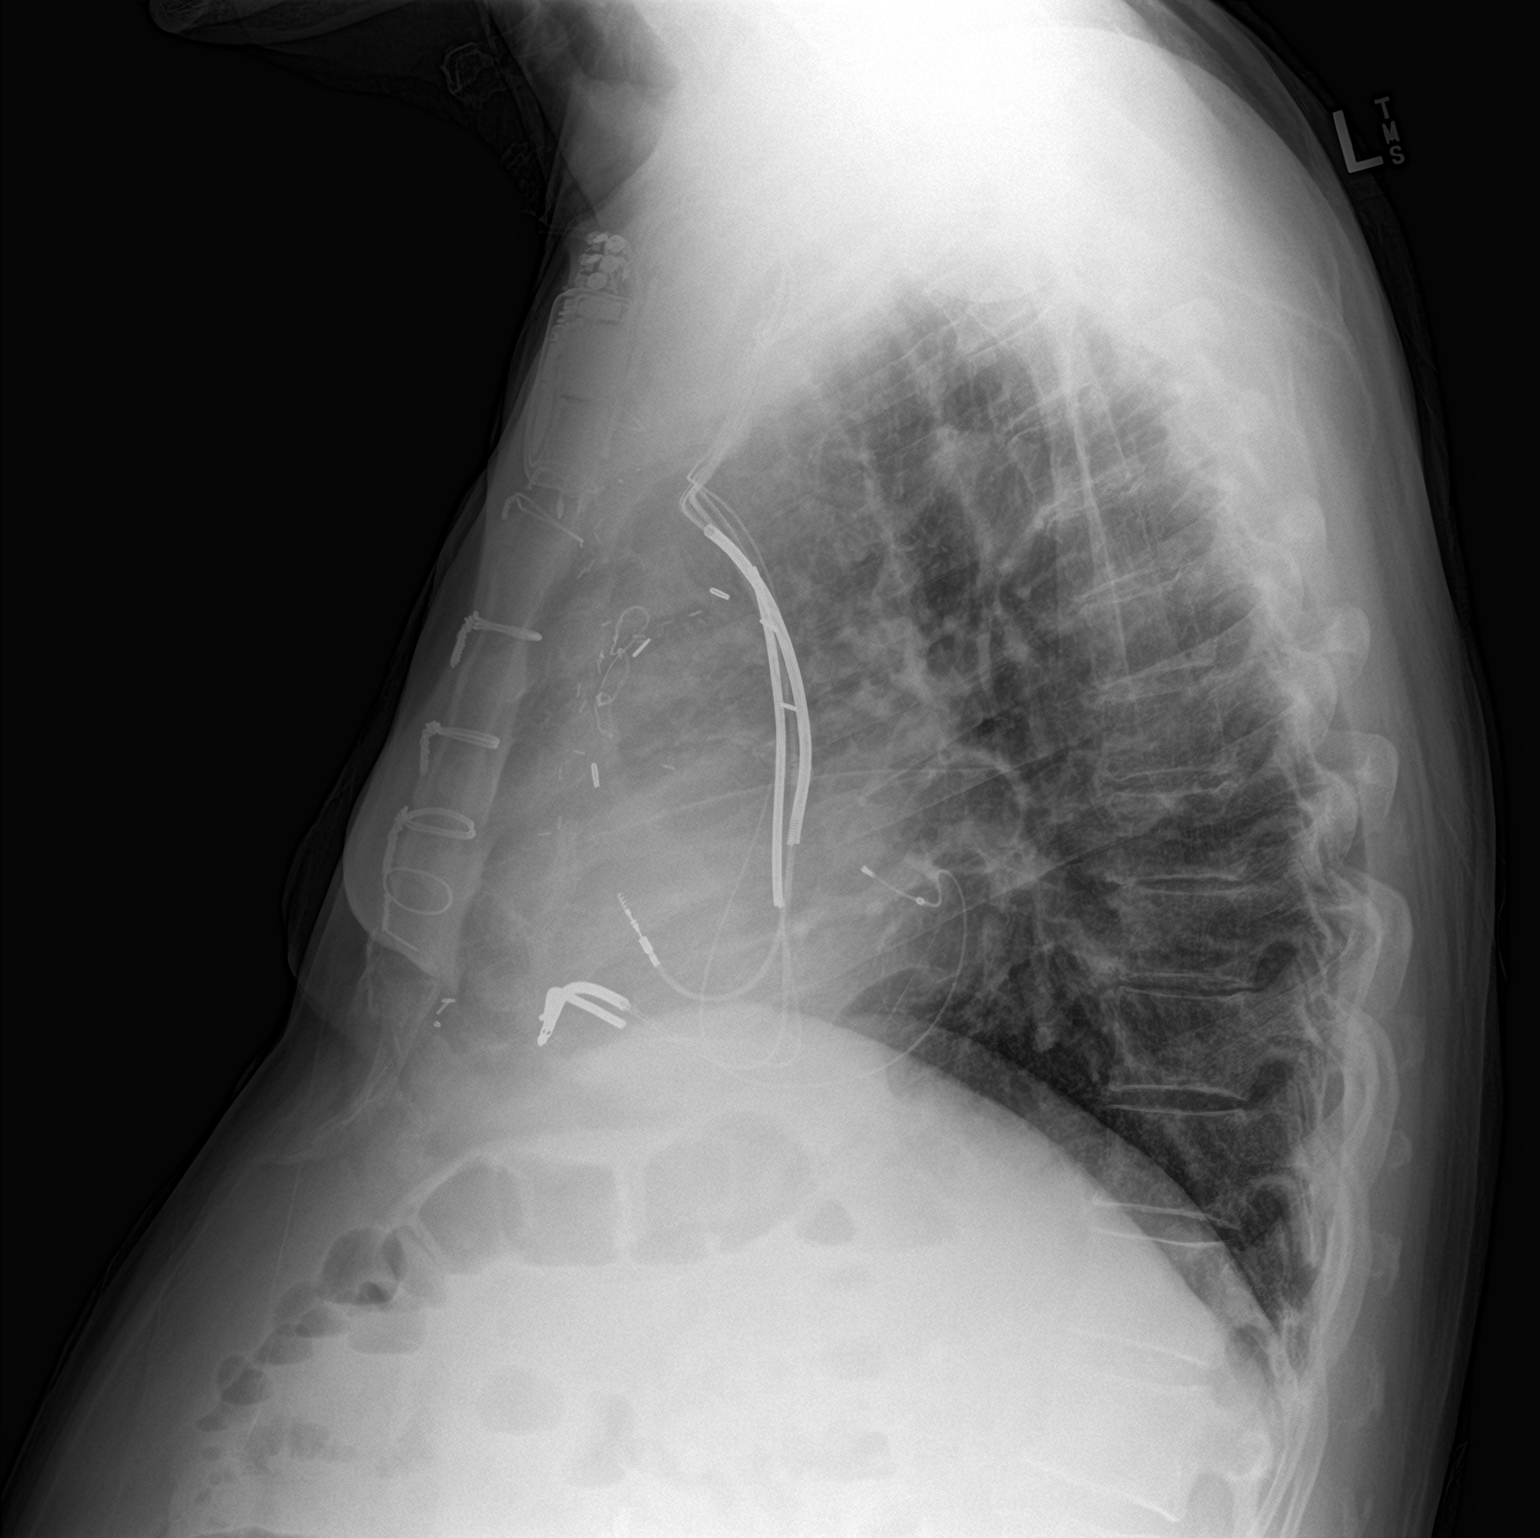

[2 of 2 positions shown; findings below may reference images not displayed]

FINDINGS: Prior sternotomy for CABG left subclavian biventricular pacing
defibrillator unchanged. Cardiac silhouette moderately enlarged,
unchanged. Thoracic aorta mildly atherosclerotic, unchanged. Hilar
and mediastinal contours otherwise unremarkable. Mild linear
atelectasis in the lower lobes related to a less than optimal
inspiration. Lungs otherwise clear. No confluent air space
consolidation. No pleural effusions. Normal pulmonary vascularity.
Minimal to mild degenerative changes involving the thoracic spine.
IMPRESSION: 1. Suboptimal inspiration accounts for mild bibasilar atelectasis.
No acute cardiopulmonary disease otherwise.
2. Stable cardiomegaly without evidence of pulmonary edema.
3.  Aortic Atherosclerosis ([id number]-170.0)

## 2020-01-06 ENCOUNTER — Other Ambulatory Visit: Payer: Self-pay | Admitting: Cardiology

## 2020-01-12 ENCOUNTER — Other Ambulatory Visit: Payer: Self-pay | Admitting: Cardiology

## 2020-02-02 ENCOUNTER — Ambulatory Visit (INDEPENDENT_AMBULATORY_CARE_PROVIDER_SITE_OTHER): Payer: Medicare HMO

## 2020-02-02 DIAGNOSIS — I5022 Chronic systolic (congestive) heart failure: Secondary | ICD-10-CM | POA: Diagnosis not present

## 2020-02-02 DIAGNOSIS — Z95810 Presence of automatic (implantable) cardiac defibrillator: Secondary | ICD-10-CM

## 2020-02-04 NOTE — Progress Notes (Signed)
EPIC Encounter for ICM Monitoring  Patient Name: [name] is a 79 y.o. male Date: 02/04/2020 Primary Care Physican: Mayra Neer, MD Primary Cardiologist:McLean Electrophysiologist: Allred Bi-V Pacing: 94.2% 8/27/2021OfficeWeight: 182lbs  Time in AT/AF   6.8 hr/day (28.3%) (taking Eliquis) Longest AT/AF     24 hours  Transmission reviewed.   OptiVolThoracicimpedance normal.  Prescribed:Furosemide40 mgtake 2 tablets (80 mg total)every morning and 1.5 tablets (60 mg total) every afternoon.  Labs: 12/03/2019 Creatinine 1.66, BUN 30, Potassium 4.4, Sodium 139, GFR 40-49 10/29/2019 Creatinine 1.75, BUN 32, Potassium 4.1, Sodium 133, GFR 37-43 10/02/2019 Creatinine1.66, BUN33, Potassium4.3, [id number], [id number] 09/18/2019 Creatinine1.99, BUN39, Potassium3.9, Sodium136, [id number]  06/18/2019 Creatinine1.63, BUN26, Potassium3.8, [id number], [id number] A complete set of results can be found in Results Review.  Recommendations:No changes.  Follow-up plan: ICM clinic phone appointment on11/22/2021. 91 day device clinic remote transmission10/28/2021.   EP/Cardiology Office Visits:03/16/2020 with Dr.McLean.12/02/2020 with Brandon Kilts, PA.  Copy of ICM check sent to Dr.Allred.  3 month ICM trend: 02/02/2020    1 Year ICM trend:       Brandon Billings, RN 02/04/2020 8:16 AM

## 2020-02-12 ENCOUNTER — Ambulatory Visit (INDEPENDENT_AMBULATORY_CARE_PROVIDER_SITE_OTHER): Payer: Medicare HMO

## 2020-02-12 DIAGNOSIS — I255 Ischemic cardiomyopathy: Secondary | ICD-10-CM | POA: Diagnosis not present

## 2020-02-12 LAB — CUP PACEART REMOTE DEVICE CHECK
Battery Remaining Longevity: 50 mo
Battery Voltage: 2.96 V
Brady Statistic AP VP Percent: 36.29 %
Brady Statistic AP VS Percent: 0.13 %
Brady Statistic AS VP Percent: 62.28 %
Brady Statistic AS VS Percent: 1.3 %
Brady Statistic RA Percent Paced: 34.15 %
Brady Statistic RV Percent Paced: 95.88 %
Date Time Interrogation Session: 20211028043624
HighPow Impedance: 43 Ohm
HighPow Impedance: 54 Ohm
Implantable Lead Implant Date: 20111003
Implantable Lead Implant Date: 20111003
Implantable Lead Implant Date: 20130509
Implantable Lead Location: 753858
Implantable Lead Location: 753859
Implantable Lead Location: 753860
Implantable Lead Model: 4196
Implantable Lead Model: 5076
Implantable Lead Model: 6947
Implantable Pulse Generator Implant Date: 20191030
Lead Channel Impedance Value: 380 Ohm
Lead Channel Impedance Value: 380 Ohm
Lead Channel Impedance Value: 437 Ohm
Lead Channel Impedance Value: 513 Ohm
Lead Channel Impedance Value: 608 Ohm
Lead Channel Impedance Value: 665 Ohm
Lead Channel Pacing Threshold Amplitude: 0.625 V
Lead Channel Pacing Threshold Amplitude: 1.125 V
Lead Channel Pacing Threshold Amplitude: 2.125 V
Lead Channel Pacing Threshold Pulse Width: 0.4 ms
Lead Channel Pacing Threshold Pulse Width: 0.4 ms
Lead Channel Pacing Threshold Pulse Width: 0.4 ms
Lead Channel Sensing Intrinsic Amplitude: 1.125 mV
Lead Channel Sensing Intrinsic Amplitude: 1.125 mV
Lead Channel Sensing Intrinsic Amplitude: 31.625 mV
Lead Channel Sensing Intrinsic Amplitude: 31.625 mV
Lead Channel Setting Pacing Amplitude: 1.75 V
Lead Channel Setting Pacing Amplitude: 2 V
Lead Channel Setting Pacing Amplitude: 4.5 V
Lead Channel Setting Pacing Pulse Width: 0.4 ms
Lead Channel Setting Pacing Pulse Width: 0.4 ms
Lead Channel Setting Sensing Sensitivity: 0.3 mV

## 2020-02-17 NOTE — Progress Notes (Signed)
Remote ICD transmission.   

## 2020-02-20 ENCOUNTER — Other Ambulatory Visit: Payer: Self-pay | Admitting: Internal Medicine

## 2020-02-20 DIAGNOSIS — I480 Paroxysmal atrial fibrillation: Secondary | ICD-10-CM

## 2020-02-20 NOTE — Telephone Encounter (Addendum)
Prescription refill request for Eliquis received.  Last office visit: Mclean, 12/12/2019 Scr: 1.66 12/03/2019 Age: 79 yo Weight: 82.6 kg   Prescription refill sent.

## 2020-02-26 DIAGNOSIS — R69 Illness, unspecified: Secondary | ICD-10-CM | POA: Diagnosis not present

## 2020-03-08 ENCOUNTER — Ambulatory Visit (INDEPENDENT_AMBULATORY_CARE_PROVIDER_SITE_OTHER): Payer: Medicare HMO

## 2020-03-08 DIAGNOSIS — I5022 Chronic systolic (congestive) heart failure: Secondary | ICD-10-CM

## 2020-03-08 DIAGNOSIS — Z95810 Presence of automatic (implantable) cardiac defibrillator: Secondary | ICD-10-CM | POA: Diagnosis not present

## 2020-03-09 ENCOUNTER — Telehealth: Payer: Self-pay

## 2020-03-09 NOTE — Telephone Encounter (Signed)
Remote ICM transmission received.  Attempted call to patient regarding ICM remote transmission and left detailed message per DPR.  Advised to return call for any fluid symptoms or questions. Next ICM remote transmission scheduled 04/12/2020.

## 2020-03-09 NOTE — Progress Notes (Signed)
EPIC Encounter for ICM Monitoring  Patient Name: [name] is a 79 y.o. male Date: 03/09/2020 Primary Care Physican: Mayra Neer, MD Primary Cardiologist:McLean Electrophysiologist: Allred Bi-V Pacing: 95.1% 8/27/2021OfficeWeight: 182lbs  Time in AT/AF <0.1 hr/day (<0.1%) Longest AT/AF 25 minutes  Attempted call to patient and unable to reach.  Left detailed message per DPR regarding transmission. Transmission reviewed.   OptiVolThoracicimpedance normal.  Prescribed:Furosemide40 mgtake 2 tablets (80 mg total)every morning and 1.5 tablets (60 mg total) every afternoon.  Labs: 12/03/2019 Creatinine 1.66, BUN 30, Potassium 4.4, Sodium 139, GFR 40-49 10/29/2019 Creatinine 1.75, BUN 32, Potassium 4.1, Sodium 133, GFR 37-43 10/02/2019 Creatinine1.66, BUN33, Potassium4.3, [id number], [id number] 09/18/2019 Creatinine1.99, BUN39, Potassium3.9, Sodium136, [id number]  06/18/2019 Creatinine1.63, BUN26, Potassium3.8, [id number], ZTI45-80 A complete set of results can be found in Results Review.  Recommendations:Left voice mail with ICM number and encouraged to call if experiencing any fluid symptoms.  Follow-up plan: ICM clinic phone appointment on12/27/2021. 91 day device clinic remote transmission1/27/2022.   EP/Cardiology Office Visits:03/16/2020 with Dr.McLean.12/02/2020 with [name], PA.  Copy of ICM check sent to Dr.Allred.  3 month ICM trend: 03/08/2020    1 Year ICM trend:       [name], RN 03/09/2020 12:59 PM

## 2020-03-16 ENCOUNTER — Other Ambulatory Visit: Payer: Self-pay

## 2020-03-16 ENCOUNTER — Ambulatory Visit (HOSPITAL_COMMUNITY)
Admission: RE | Admit: 2020-03-16 | Discharge: 2020-03-16 | Disposition: A | Discharge status: Home / Self Care | Payer: Medicare HMO | Source: Ambulatory Visit | Attending: Cardiology | Admitting: Cardiology

## 2020-03-16 ENCOUNTER — Encounter (HOSPITAL_COMMUNITY): Payer: Self-pay | Admitting: Cardiology

## 2020-03-16 VITALS — BP 138/90 | HR 79 | Wt 182.0 lb

## 2020-03-16 DIAGNOSIS — Z87891 Personal history of nicotine dependence: Secondary | ICD-10-CM | POA: Insufficient documentation

## 2020-03-16 DIAGNOSIS — Z951 Presence of aortocoronary bypass graft: Secondary | ICD-10-CM | POA: Insufficient documentation

## 2020-03-16 DIAGNOSIS — I252 Old myocardial infarction: Secondary | ICD-10-CM | POA: Diagnosis not present

## 2020-03-16 DIAGNOSIS — N183 Chronic kidney disease, stage 3 unspecified: Secondary | ICD-10-CM | POA: Insufficient documentation

## 2020-03-16 DIAGNOSIS — I5042 Chronic combined systolic (congestive) and diastolic (congestive) heart failure: Secondary | ICD-10-CM

## 2020-03-16 DIAGNOSIS — Z79899 Other long term (current) drug therapy: Secondary | ICD-10-CM | POA: Insufficient documentation

## 2020-03-16 DIAGNOSIS — E785 Hyperlipidemia, unspecified: Secondary | ICD-10-CM | POA: Insufficient documentation

## 2020-03-16 DIAGNOSIS — I5022 Chronic systolic (congestive) heart failure: Secondary | ICD-10-CM | POA: Diagnosis not present

## 2020-03-16 DIAGNOSIS — I25810 Atherosclerosis of coronary artery bypass graft(s) without angina pectoris: Secondary | ICD-10-CM | POA: Insufficient documentation

## 2020-03-16 DIAGNOSIS — I480 Paroxysmal atrial fibrillation: Secondary | ICD-10-CM | POA: Diagnosis not present

## 2020-03-16 DIAGNOSIS — Z7901 Long term (current) use of anticoagulants: Secondary | ICD-10-CM | POA: Insufficient documentation

## 2020-03-16 DIAGNOSIS — Z8249 Family history of ischemic heart disease and other diseases of the circulatory system: Secondary | ICD-10-CM | POA: Insufficient documentation

## 2020-03-16 DIAGNOSIS — Z794 Long term (current) use of insulin: Secondary | ICD-10-CM | POA: Diagnosis not present

## 2020-03-16 DIAGNOSIS — Z955 Presence of coronary angioplasty implant and graft: Secondary | ICD-10-CM | POA: Diagnosis not present

## 2020-03-16 DIAGNOSIS — I2510 Atherosclerotic heart disease of native coronary artery without angina pectoris: Secondary | ICD-10-CM | POA: Diagnosis not present

## 2020-03-16 DIAGNOSIS — E1122 Type 2 diabetes mellitus with diabetic chronic kidney disease: Secondary | ICD-10-CM | POA: Insufficient documentation

## 2020-03-16 DIAGNOSIS — I255 Ischemic cardiomyopathy: Secondary | ICD-10-CM | POA: Diagnosis not present

## 2020-03-16 LAB — CBC
HCT: 44 % (ref 39.0–52.0)
Hemoglobin: 14 g/dL (ref 13.0–17.0)
MCH: 30.5 pg (ref 26.0–34.0)
MCHC: 31.8 g/dL (ref 30.0–36.0)
MCV: 95.9 fL (ref 80.0–100.0)
Platelets: 244 10*3/uL (ref 150–400)
RBC: 4.59 MIL/uL (ref 4.22–5.81)
RDW: 15.2 % (ref 11.5–15.5)
WBC: 9 10*3/uL (ref 4.0–10.5)
nRBC: 0 % (ref 0.0–0.2)

## 2020-03-16 LAB — BASIC METABOLIC PANEL
Anion gap: 11 (ref 5–15)
BUN: 22 mg/dL (ref 8–23)
CO2: 25 mmol/L (ref 22–32)
Calcium: 8.6 mg/dL — ABNORMAL LOW (ref 8.9–10.3)
Chloride: 99 mmol/L (ref 98–111)
Creatinine, Ser: 1.62 mg/dL — ABNORMAL HIGH (ref 0.61–1.24)
GFR, Estimated: 43 mL/min — ABNORMAL LOW (ref >60)
Glucose, Bld: 136 mg/dL — ABNORMAL HIGH (ref 70–99)
Potassium: 3.5 mmol/L (ref 3.5–5.1)
Sodium: 135 mmol/L (ref 135–145)

## 2020-03-16 MED ORDER — RANOLAZINE ER 1000 MG PO TB12
1000.0000 mg | ORAL_TABLET | Freq: Two times a day (BID) | ORAL | 3 refills | Status: DC
Start: 2020-03-16 — End: 2020-04-27

## 2020-03-16 MED ORDER — DAPAGLIFLOZIN PROPANEDIOL 10 MG PO TABS: 10.0000 mg | ORAL_TABLET | Freq: Every day | ORAL | 3 refills | Status: DC

## 2020-03-16 NOTE — Patient Instructions (Addendum)
Labs done today. We will contact you only if your labs are abnormal.  INCREASE Ranexa 1000mg (1 tablet) by mouth 2 times daily.  INCREASE Farxiga 10mg  (1 tablet) by mouth daily.  No other medication changes were made. Please continue all other current medications as prescribed.   Your physician recommends that you schedule a follow-up appointment in: 3 months.  If you have any questions or concerns before your next appointment please send [place] a message through [place] or call our office at [phone].    TO LEAVE A MESSAGE FOR THE NURSE SELECT OPTION 2, PLEASE LEAVE A MESSAGE INCLUDING: . YOUR NAME . DATE OF BIRTH . CALL BACK NUMBER . REASON FOR CALL**this is important as we prioritize the call backs  YOU WILL RECEIVE A CALL BACK THE SAME DAY AS LONG AS YOU CALL BEFORE 4:00 PM   Do the following things EVERYDAY: 1) Weigh yourself in the morning before breakfast. Write it down and keep it in a log. 2) Take your medicines as prescribed 3) Eat low salt foods--Limit salt (sodium) to 2000 mg per day.  4) Stay as active as you can everyday 5) Limit all fluids for the day to less than 2 liters   At the [facility], you and your health needs are our priority. As part of our continuing mission to provide you with exceptional heart care, we have created designated Provider Care Teams. These Care Teams include your primary Cardiologist (physician) and Advanced Practice Providers (APPs- Physician Assistants and Nurse Practitioners) who all work together to provide you with the care you need, when you need it.   You may see any of the following providers on your designated Care Team at your next follow up: Marland Kitchen Dr Glori Bickers . Dr [name] . Darrick Grinder, NP . Lyda Jester, PA . Audry Riles, PharmD   Please be sure to bring in all your medications bottles to every appointment.

## 2020-03-16 NOTE — Progress Notes (Signed)
Advanced Heart Failure Clinic Note   Patient ID: [name], male   DOB: [birth year], 79 y.o.   MRN: [id number] PCP: Dr. Lynnda Child Cardiology: Dr Aundra Dubin  79 y.o. with history of CAD s/p CABG and redo CABG as well as ischemic cardiomyopathy with CRT-D device presents for followup of CHF and CAD.  He had a Cardiolite in the 4/15 showing lateral wall ischemia.  LHC at that time showed all his vein grafts from both CABG surgeries occluded.  The LIMA-LAD was patent and there was a 90% distal LM stenosis.  The lateral ischemia likely corresponded to LCx territory downstream from the LM stenosis.  PCI of the distal LM was thought to be high risk and characteristics of the lesion were not favorable for PCI.  Last echo showed EF 25-30% with moderate RV systolic dysfunction.  He had RHC in 11/15 with normal filling pressures and relatively preserved cardiac index (2.55).    In 4/17, he was admitted with chest pain concerning for unstable angina.  Angiography showed 85% distal LM with 90% ostial LCx stenosis.  LIMA was patent but proximal LAD, proximal RCA, and all SVGs were totally occluded. High had successful DES to LM and PTCA to ostial LCx with Impella support.  [name] was stopped and he was put back on low dose lisinopril due to symptomatic hypotension.  He had Cardiolite in 11/17 with scar, no ischemia.   He was admitted in 1/19 with AKI, creatinine up to 3. Meds held then restarted.   Echo 3/19 with EF 30-35%.   He was admitted with syncope and AKI in 6/19.  He was thought to be dehydrated and orthostatic. Losartan stopped but eventually restarted.   He was noted to have prolonged atrial fibrillation by device check in 3/20, now on Eliquis.   Echo was done in 6/20, showing EF 25-30% with inferior/inferolateral AK, moderate LV dilation, mild LVH, mild MR, severe LAE.    CPX in 11/20 showed moderate functional limitation due to HF.   He went into atrial fibrillation in 6/21, had TEE-guided DCCV  in 6/21 back to NSR.  TEE showed EF 25-30%, moderately dilated LV, normal RV.   He returns for followup of CHF and CAD.  He is in NSR today. No dyspnea walking on flat ground.  He still gets lightheaded with standing at times.  He has had several falls in the last few months. He is short of breath pulling his trashcan [place] his driveway ([place]).  No orthopnea/PND.  No BRBPR/melena.  Weight is stable.   ECG (personally reviewed): NSR, BiV pacing, paced QTc 503 msec  Medtronic device interrogation: Thoracic impedance stable, rare short AF runs.   Labs (9/15): LDL particle number 816, LDL 57, LFTs normal Labs (10/15): K 4.4, creatinine 1.4 Labs (11/15): K 4.3, creatinine 1.36 Labs (12/15): K 4.8, creatinine 1.34 Labs (3/16): K 4, creatinine 1.38, LDL 46, HDl 35 Labs (7/16): K 4, creatinine 1.39, BNP 211 Labs (03/03/15): K 4.1, creatinine 1.47, BNP 227, LDL 80, HDL 32 Labs (12/16): K 4.6, creatinine 1.12, LDL 68, HDL 34 Labs (4/17): K 3.4, creatinine 1.15 Labs (6/17): K 3.8, creatinine 1.67 Labs (7/17): K 4, creatinine 1.35 Labs (10/17): K 3.9, creatinine 1.31, LDL 45, HDL 30 Labs (2/18): K 4, creatinine 1.43 Labs (5/18): LDL 44, HDL 27 Labs (6/18): K 4.1, creatinine 1.49 Labs (12/18): K 4.4, creatinine 1.44 Labs (1/19): K 4.3, creatinine 1.48 Labs (2/19): K 3.7, creatinine 1.6 Labs (6/19): K 4, creatinine 1.5 Labs (  9/19): K 4.4, creatinine 1.6, hgb 13.4 Labs (10/19): K 4.3, creatinine 1.44 Labs (1/20): K 4, creatinine 1.65 Labs (6/20): K 3.9, creatinine 1.58, LDL 58, HDL 31, hgb 13.3 Labs (7/20): K 3.9, creatinine 1.5 Labs (10/20): K 4, creatinine 1.7 Labs (12/20): K 3.9, creatinine 1.56 Labs (5/21): K 4.1, creatinine 1.7 Labs (8/21): K 4.4, creatinine 1.66, LDL 57  PMH: 1. Gout 2. Hyperlipidemia 3. CAD: CABG 1997 and redo 11/12.   - LHC (4/15) with totally occluded LAD, totally occluded RCA, 80% distal LM, 50% mLCx, 2 SVG-RCA grafts totally occluded, 2 SVG-OM grafts totally  occluded, patent LIMA-LAD.  Cardiolite prior to 4/15 cath showed lateral wall ischemia (LCx territory).  PCI to distal LM would be a high risk procedure.   - Cardiolite (8/16) with EF 20%, severe scar in RCA and probably LCx territory, minimal peri-infarct ischemia.   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  - Unstable angina 4/17: 85% distal LM with 90% ostial LCx stenosis.  LIMA was patent but proximal LAD, proximal RCA, and all SVGs were totally occluded. He had successful DES to LM and PTCA to ostial LCx with Impella support  - Cardiolite (11/17): EF 20%, prior MI, no ischemia.  4. Ischemic Cardiomyopathy: Medtronic CRT-D device.   - Echo (6/15) with EF 25-30%, moderate LV dilation, inferior and inferolateral akinesis, moderately decreased RV systolic function, mild MR.   - RHC (11/15) with mean RA 5, PA 23/6, mean PCWP 9, CI 2.55.  - Echo (4/17): EF 25-30% with mild MR.  - Echo (11/17): EF 25-30%, moderately dilated LV, grade II diastolic dysfunction, mild-moderate MR, mildly decreased RV systolic function.  - Echo (3/19): EF 30-35%, inferior/inferolateral akinesis, normal RV size with mildly decreased systolic function.  - Echo (6/20): EF 25-30% with inferior/inferolateral AK, moderate LV dilation, mild LVH, mild MR, severe LAE. - CPX (11/20): VO2 max 14.9 (60% predicted), VE/VCO2 slope 34, RER 1.05.  Moderate functional deficit due to HF.  - TEE (6/21): EF 25-30%, moderately dilated LV, normal RV. 5. H/o cholecystectomy 6. OA 7. Depression 8. Type II diabetes 9. GERD 10. CKD stage 3 11. ?Early dementia 12. Atrial fibrillation: Paroxysmal.  - DCCV to NSR in 6/21.  13. B12 deficiency.   SH: Married, prior smoker (many years ago), lives in [place].    FH: CAD  ROS: All systems reviewed and negative except as per HPI.   Current Outpatient Medications  Medication Sig Dispense Refill  . acetaminophen (TYLENOL) 500 MG tablet Take 1,000 mg by  mouth every 6 (six) hours as needed for moderate pain.    Marland Kitchen allopurinol (ZYLOPRIM) 100 MG tablet Take 200 mg by mouth daily.     . Aromatic Inhalants (VICKS VAPOINHALER) INHA Inhale 1 puff into the lungs daily as needed (congestion).    Marland Kitchen atorvastatin (LIPITOR) 80 MG tablet TAKE 1 TABLET BY MOUTH ONCE DAILY AT  6PM  IN  THE  EVENING 90 tablet 3  . carvedilol (COREG) 6.25 MG tablet Take 1 tablet by mouth twice daily 180 tablet 3  . Continuous Blood Gluc Sensor (FREESTYLE LIBRE 14 DAY SENSOR) MISC USE TO CHECK GLUCOSE THREE TIMES DAILY AS DIRECTED    . ELIQUIS 5 MG TABS tablet Take 1 tablet by mouth twice daily 60 tablet 5  . eplerenone (INSPRA) 50 MG tablet TAKE 1 TABLET BY MOUTH AT BEDTIME 30 tablet 11  . furosemide (LASIX) 40 MG tablet TAKE 2 TABLETS BY MOUTH EVERY MORNING AND 1 &  1/2 (ONE & ONE-HALF) TABLET EVERY EVENING 315 tablet 0  . Homeopathic Products [facility] ALLERGY EYE RELIEF OP) Place 1 drop into both eyes daily as needed (irritation).    . Insulin Isophane & Regular Human (NOVOLIN 70/30 FLEXPEN) (70-30) 100 UNIT/ML PEN Inject 40 Units into the skin daily before lunch.     . loperamide (IMODIUM A-D) 2 MG tablet Take 2 mg by mouth 4 (four) times daily as needed for diarrhea or loose stools.    Marland Kitchen losartan (COZAAR) 25 MG tablet Take 0.5 tablets (12.5 mg total) by mouth at bedtime. 45 tablet 3  . QUEtiapine (SEROQUEL) 100 MG tablet Take 50-100 mg by mouth at bedtime.   3  . ranolazine (RANEXA) 1000 MG SR tablet Take 1 tablet (1,000 mg total) by mouth 2 (two) times daily. 180 tablet 3  . triamcinolone (KENALOG) 0.147 MG/GM topical spray Apply 1 spray topically See admin instructions. Use after each shower    . triamcinolone cream (KENALOG) 0.1 % Apply 1 application topically 2 (two) times daily as needed (for dry/irritated skin.).    Marland Kitchen venlafaxine (EFFEXOR-XR) 150 MG 24 hr capsule Take 150 mg by mouth daily with breakfast.     . vitamin B-12 (CYANOCOBALAMIN) 1000 MCG tablet Take 1,000 mcg  by mouth daily.    . dapagliflozin propanediol (FARXIGA) 10 MG TABS tablet Take 1 tablet (10 mg total) by mouth daily before breakfast. 90 tablet 3  . nitroGLYCERIN (NITROSTAT) 0.4 MG SL tablet Place 1 tablet (0.4 mg total) under the tongue every 5 (five) minutes as needed for chest pain. DO NOT EXCEED A TOTAL OF 3 DOSES IN 15 MINUTES (Patient not taking: Reported on 03/16/2020) 25 tablet 6   No current facility-administered medications for this encounter.   BP 138/90   Pulse 79   Wt 82.6 kg (182 lb)   SpO2 97%   BMI 26.88 kg/m    Wt Readings from Last 3 Encounters:  03/16/20 82.6 kg (182 lb)  12/12/19 82.6 kg (182 lb)  12/04/19 83.9 kg (185 lb)    General: NAD Neck: No JVD, no thyromegaly or thyroid nodule.  Lungs: Clear to auscultation bilaterally with normal respiratory effort. CV: Nondisplaced PMI.  Heart regular S1/S2, no S3/S4, no murmur.  No peripheral edema.  No carotid bruit.  Normal pedal pulses.  Abdomen: Soft, nontender, no hepatosplenomegaly, no distention.  Skin: Intact without lesions or rashes.  Neurologic: Alert and oriented x 3.  Psych: Normal affect. Extremities: No clubbing or cyanosis.  HEENT: Normal.   Assessment/Plan: 1. Chronic systolic CHF: Ischemic cardiomyopathy.  Echo in 6/20 showed EF 25-30%. He has a Medtronic CRT-D device.  CPX in 11/20 showed moderate functional limitation due to CHF.  TEE in 6/21 with EF 25-30%, normal RV.  He is not volume overloaded on exam or by Optivol. NYHA class II symptoms.  - Continue Coreg 6.25 mg bid. Will not titrate due to orthostatic symptoms (chronically).  - Continue Lasix 80 qam/40 qpm. BMET today.       - Continue eplerenone 50 mg daily.   - BP did not tolerate Entresto.  Had ACEI cough. Continue losartan 12.5 mg daily, will not increase with orthostatic symptoms. .   - Increase dapagliflozin to 10 mg daily.  - Would be reasonable to consider vagal nerve stimulation via ANTHEM trial.  2. CAD: s/p CABG and redo.  Had DES to distal LM, angioplasty to ostial LCx in 4/17. Cardiolite 11/17 with scar, no ischemia.  No recent chest  pain. - Continue statin.  - He is off ASA/Plavix and taking apixaban 5 mg bid.  - Continue Coreg and ranolazine 1000 mg bid.  3. CKD stage III: Creatinine stable on recent BMET.   4. Hyperlipidemia: Continue statin. Good lipids in 8/21.  5. Atrial fibrillation: Paroxysmal atrial fibrillation.  - Continue apixaban.  CBC today.   Followup in 3 months.   [name] 03/16/2020

## 2020-03-18 ENCOUNTER — Telehealth: Payer: Self-pay

## 2020-03-18 NOTE — Telephone Encounter (Signed)
Carelink alert received 03/16/20 for 55 AHR, AT/AF burden 1.4%. Appears AT based on rates w/ controlled VR's. Longest in duration 2.25 hours.  Per Med list Coreg 6.25 mg BID, Eliquis 5 mg BID, Lasix 40 mg (2 tablets in AM and 1 1/2 tablets in PM).  Called patient to discuss symptoms and medication compliance. No answer, LMOVM.

## 2020-03-19 NOTE — Telephone Encounter (Signed)
Patient's wife returned phone call.  It appears the majority of episodes were during nocturnal hours.  Spouse reports pt does have diagnosed Sleep Apnea for which he is not being treated.     She reports pt did have an appt with Dr. Aundra Dubin on 11/30, changes made in [place] and Ranexa dosages.    No changes in plan at this time, will continue to monitor.

## 2020-04-07 ENCOUNTER — Other Ambulatory Visit (HOSPITAL_COMMUNITY): Payer: Self-pay | Admitting: Cardiology

## 2020-04-09 ENCOUNTER — Other Ambulatory Visit (HOSPITAL_COMMUNITY): Payer: Self-pay | Admitting: Cardiology

## 2020-04-12 ENCOUNTER — Ambulatory Visit (INDEPENDENT_AMBULATORY_CARE_PROVIDER_SITE_OTHER): Payer: Medicare HMO

## 2020-04-12 DIAGNOSIS — I5042 Chronic combined systolic (congestive) and diastolic (congestive) heart failure: Secondary | ICD-10-CM | POA: Diagnosis not present

## 2020-04-12 DIAGNOSIS — Z95810 Presence of automatic (implantable) cardiac defibrillator: Secondary | ICD-10-CM | POA: Diagnosis not present

## 2020-04-14 NOTE — Progress Notes (Signed)
EPIC Encounter for ICM Monitoring  Patient Name: [name] is a 79 y.o. male Date: 04/14/2020 Primary Care Physican: Lupita Raider, MD Primary Cardiologist:McLean Electrophysiologist: Allred Bi-V Pacing: 96.4% 11/30/2021OfficeWeight: 182lbs  Time in AT/AF  <0.1 hr/day (0.3%) Longest AT/AF 25 minutes  Transmission reviewed.   OptiVolThoracicimpedance normal.  Prescribed:  Furosemide40 mgtake 2 tablets (80 mg total)every morning and 1.5 tablets (60 mg total) every afternoon.  Labs: 03/16/2020 Creatinine 1.62. BUN 22, Potassium 3.5, Sodium 135, GFR 43 12/03/2019 Creatinine 1.66, BUN 30, Potassium 4.4, Sodium 139, GFR 40-49 10/29/2019 Creatinine 1.75, BUN 32, Potassium 4.1, Sodium 133, GFR 37-43 10/02/2019 Creatinine1.66, BUN33, Potassium4.3, Sodium137, GFR39-45 09/18/2019 Creatinine1.99, BUN39, Potassium3.9, Sodium136, GFR31-36  06/18/2019 Creatinine1.63, BUN26, Potassium3.8, [id number], [id number] A complete set of results can be found in Results Review.  Recommendations: No changes.  Follow-up plan: ICM clinic phone appointment on2/10/2020. 91 day device clinic remote transmission1/27/2022.   EP/Cardiology Office Visits:06/15/2020 with Dr.McLean.12/02/2020 with Otilio Saber, PA.  Copy of ICM check sent to Dr.Allred.   3 month ICM trend: 04/12/2020    1 Year ICM trend:       Karie Soda, RN 04/14/2020 1:16 PM

## 2020-04-15 DIAGNOSIS — E7800 Pure hypercholesterolemia, unspecified: Secondary | ICD-10-CM | POA: Diagnosis not present

## 2020-04-15 DIAGNOSIS — I5022 Chronic systolic (congestive) heart failure: Secondary | ICD-10-CM | POA: Diagnosis not present

## 2020-04-15 DIAGNOSIS — I130 Hypertensive heart and chronic kidney disease with heart failure and stage 1 through stage 4 chronic kidney disease, or unspecified chronic kidney disease: Secondary | ICD-10-CM | POA: Diagnosis not present

## 2020-04-15 DIAGNOSIS — E1121 Type 2 diabetes mellitus with diabetic nephropathy: Secondary | ICD-10-CM | POA: Diagnosis not present

## 2020-04-26 NOTE — Progress Notes (Signed)
Advanced Heart Failure Clinic Note   Patient ID: [name], male   DOB: [birth year], 80 y.o.   MRN: [id number] PCP: Dr. Lynnda Child Cardiology: Dr Aundra Dubin  80 y.o. with history of CAD s/p CABG and redo CABG as well as ischemic cardiomyopathy with CRT-D device presents for followup of CHF and CAD.  He had a Cardiolite in the 4/15 showing lateral wall ischemia.  LHC at that time showed all his vein grafts from both CABG surgeries occluded.  The LIMA-LAD was patent and there was a 90% distal LM stenosis.  The lateral ischemia likely corresponded to LCx territory downstream from the LM stenosis.  PCI of the distal LM was thought to be high risk and characteristics of the lesion were not favorable for PCI.  Last echo showed EF 25-30% with moderate RV systolic dysfunction.  He had RHC in 11/15 with normal filling pressures and relatively preserved cardiac index (2.55).    In 4/17, he was admitted with chest pain concerning for unstable angina.  Angiography showed 85% distal LM with 90% ostial LCx stenosis.  LIMA was patent but proximal LAD, proximal RCA, and all SVGs were totally occluded. High had successful DES to LM and PTCA to ostial LCx with Impella support.  [name] was stopped and he was put back on low dose lisinopril due to symptomatic hypotension.  He had Cardiolite in 11/17 with scar, no ischemia.   He was admitted in 1/19 with AKI, creatinine up to 3. Meds held then restarted.   Echo 3/19 with EF 30-35%.   He was admitted with syncope and AKI in 6/19.  He was thought to be dehydrated and orthostatic. Losartan stopped but eventually restarted.   He was noted to have prolonged atrial fibrillation by device check in 3/20, now on Eliquis.   Echo was done in 6/20, showing EF 25-30% with inferior/inferolateral AK, moderate LV dilation, mild LVH, mild MR, severe LAE.    CPX in 11/20 showed moderate functional limitation due to HF.   He went into atrial fibrillation in 6/21, had TEE-guided DCCV  in 6/21 back to NSR.  TEE showed EF 25-30%, moderately dilated LV, normal RV.   Saw PCP on 04/21/20 and lasix + farxiga stopped due to othostatic  hypotension. SBP dropped to the 70s when standing. Also ranexa was cut back from 1000-->500 mg.   His wife has been checking orthostatics pressures. Todays reading--> Lying 123/64 Sitting 128/65 Standing 101/58   Today he returns for HF follow up with his wife. Having issue with low blood pressure and dizziness when standing. Overall feeling ok but having some dizziness. Denies SOB/PND/Orthopnea. Not very active at home.  Appetite ok. No fever or chills. Weight at home 176-178  pounds. Taking all medications.   Medtronic device interrogation:Fluid index low. Impedance trending down. Activity < 1 hour  Labs (9/15): LDL particle number 816, LDL 57, LFTs normal Labs (10/15): K 4.4, creatinine 1.4 Labs (11/15): K 4.3, creatinine 1.36 Labs (12/15): K 4.8, creatinine 1.34 Labs (3/16): K 4, creatinine 1.38, LDL 46, HDl 35 Labs (7/16): K 4, creatinine 1.39, BNP 211 Labs (03/03/15): K 4.1, creatinine 1.47, BNP 227, LDL 80, HDL 32 Labs (12/16): K 4.6, creatinine 1.12, LDL 68, HDL 34 Labs (4/17): K 3.4, creatinine 1.15 Labs (6/17): K 3.8, creatinine 1.67 Labs (7/17): K 4, creatinine 1.35 Labs (10/17): K 3.9, creatinine 1.31, LDL 45, HDL 30 Labs (2/18): K 4, creatinine 1.43 Labs (5/18): LDL 44, HDL 27 Labs (6/18): K 4.1, creatinine  1.49 Labs (12/18): K 4.4, creatinine 1.44 Labs (1/19): K 4.3, creatinine 1.48 Labs (2/19): K 3.7, creatinine 1.6 Labs (6/19): K 4, creatinine 1.5 Labs (9/19): K 4.4, creatinine 1.6, hgb 13.4 Labs (10/19): K 4.3, creatinine 1.44 Labs (1/20): K 4, creatinine 1.65 Labs (6/20): K 3.9, creatinine 1.58, LDL 58, HDL 31, hgb 13.3 Labs (7/20): K 3.9, creatinine 1.5 Labs (10/20): K 4, creatinine 1.7 Labs (12/20): K 3.9, creatinine 1.56 Labs (5/21): K 4.1, creatinine 1.7 Labs (8/21): K 4.4, creatinine 1.66, LDL 57  PMH: 1.  Gout 2. Hyperlipidemia 3. CAD: CABG 1997 and redo 11/12.   - LHC (4/15) with totally occluded LAD, totally occluded RCA, 80% distal LM, 50% mLCx, 2 SVG-RCA grafts totally occluded, 2 SVG-OM grafts totally occluded, patent LIMA-LAD.  Cardiolite prior to 4/15 cath showed lateral wall ischemia (LCx territory).  PCI to distal LM would be a high risk procedure.   - Cardiolite (8/16) with EF 20%, severe scar in RCA and probably LCx territory, minimal peri-infarct ischemia.   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  - Unstable angina 4/17: 85% distal LM with 90% ostial LCx stenosis.  LIMA was patent but proximal LAD, proximal RCA, and all SVGs were totally occluded. He had successful DES to LM and PTCA to ostial LCx with Impella support  - Cardiolite (11/17): EF 20%, prior MI, no ischemia.  4. Ischemic Cardiomyopathy: Medtronic CRT-D device.   - Echo (6/15) with EF 25-30%, moderate LV dilation, inferior and inferolateral akinesis, moderately decreased RV systolic function, mild MR.   - RHC (11/15) with mean RA 5, PA 23/6, mean PCWP 9, CI 2.55.  - Echo (4/17): EF 25-30% with mild MR.  - Echo (11/17): EF 25-30%, moderately dilated LV, grade II diastolic dysfunction, mild-moderate MR, mildly decreased RV systolic function.  - Echo (3/19): EF 30-35%, inferior/inferolateral akinesis, normal RV size with mildly decreased systolic function.  - Echo (6/20): EF 25-30% with inferior/inferolateral AK, moderate LV dilation, mild LVH, mild MR, severe LAE. - CPX (11/20): VO2 max 14.9 (60% predicted), VE/VCO2 slope 34, RER 1.05.  Moderate functional deficit due to HF.  - TEE (6/21): EF 25-30%, moderately dilated LV, normal RV. 5. H/o cholecystectomy 6. OA 7. Depression 8. Type II diabetes 9. GERD 10. CKD stage 3 11. ?Early dementia 12. Atrial fibrillation: Paroxysmal.  - DCCV to NSR in 6/21.  13. B12 deficiency.   SH: Married, prior smoker (many years ago), lives in  [place].    FH: CAD  ROS: All systems reviewed and negative except as per HPI.   Current Outpatient Medications  Medication Sig Dispense Refill  . acetaminophen (TYLENOL) 500 MG tablet Take 1,000 mg by mouth every 6 (six) hours as needed for moderate pain.    Marland Kitchen allopurinol (ZYLOPRIM) 100 MG tablet Take 200 mg by mouth daily.    Marland Kitchen atorvastatin (LIPITOR) 80 MG tablet TAKE 1 TABLET BY MOUTH ONCE DAILY AT  6PM  IN  THE  EVENING 90 tablet 3  . carvedilol (COREG) 6.25 MG tablet Take 1 tablet by mouth twice daily 180 tablet 3  . Continuous Blood Gluc Sensor (FREESTYLE LIBRE 14 DAY SENSOR) MISC USE TO CHECK GLUCOSE THREE TIMES DAILY AS DIRECTED    . ELIQUIS 5 MG TABS tablet Take 1 tablet by mouth twice daily 60 tablet 5  . eplerenone (INSPRA) 50 MG tablet TAKE 1 TABLET BY MOUTH AT BEDTIME 30 tablet 11  . Homeopathic Products [facility] ALLERGY EYE RELIEF OP) Place  1 drop into both eyes daily as needed (irritation).    . Insulin Isophane & Regular Human (NOVOLIN 70/30 FLEXPEN) (70-30) 100 UNIT/ML PEN Inject 40 Units into the skin daily before lunch.     . loperamide (IMODIUM A-D) 2 MG tablet Take 2 mg by mouth 4 (four) times daily as needed for diarrhea or loose stools.    Marland Kitchen losartan (COZAAR) 25 MG tablet TAKE 1/2 (ONE-HALF) TABLET BY MOUTH ONCE DAILY AT BEDTIME 45 tablet 2  . nitroGLYCERIN (NITROSTAT) 0.4 MG SL tablet Place 1 tablet (0.4 mg total) under the tongue every 5 (five) minutes as needed for chest pain. DO NOT EXCEED A TOTAL OF 3 DOSES IN 15 MINUTES 25 tablet 6  . QUEtiapine (SEROQUEL) 100 MG tablet Take 100 mg by mouth at bedtime. Taking 0.5 tablet at bedtime    . ranolazine (RANEXA) 500 MG 12 hr tablet Take 500 mg by mouth 2 (two) times daily.    Marland Kitchen triamcinolone (KENALOG) 0.147 MG/GM topical spray Apply 1 spray topically See admin instructions. Use after each shower    . triamcinolone cream (KENALOG) 0.1 % Apply 1 application topically 2 (two) times daily as needed (for dry/irritated  skin.).    Marland Kitchen venlafaxine (EFFEXOR-XR) 150 MG 24 hr capsule Take 150 mg by mouth daily with breakfast.    . vitamin B-12 (CYANOCOBALAMIN) 1000 MCG tablet Take 1,000 mcg by mouth daily.     No current facility-administered medications for this encounter.   BP 112/68   Pulse 71   Wt 82.6 kg (182 lb 3.2 oz)   SpO2 95%   BMI 26.91 kg/m    Wt Readings from Last 3 Encounters:  04/27/20 82.6 kg (182 lb 3.2 oz)  03/16/20 82.6 kg (182 lb)  12/12/19 82.6 kg (182 lb)  General:  Well appearing. No resp difficulty. Walked in the clinic.  HEENT: normal Neck: supple. no JVD. Carotids 2+ bilat; no bruits. No lymphadenopathy or thryomegaly appreciated. Cor: PMI nondisplaced. Regular rate & rhythm. No rubs, gallops or murmurs. Lungs: clear Abdomen: soft, nontender, nondistended. No hepatosplenomegaly. No bruits or masses. Good bowel sounds. Extremities: no cyanosis, clubbing, rash, edema Neuro: alert & orientedx3, cranial nerves grossly intact. moves all 4 extremities w/o difficulty. Affect pleasant  Assessment/Plan: 1. Chronic systolic CHF: Ischemic cardiomyopathy.  Echo in 6/20 showed EF 25-30%. He has a Medtronic CRT-D device.  CPX in 11/20 showed moderate functional limitation due to CHF.  TEE in 6/21 with EF 25-30%, normal RV.   - Optivol fluid index low. Not suggestive overload.   - NYHA II. Volume status ok off lasix. Keep off lasix. For now. Having ongoing orthostatic hypotension.  -Continue Coreg 6.25 mg bid. Will not titrate due to orthostatic symptoms (chronically).    - Cut back eplerenone 25 mg daily and move to bedtime.    - BP did not tolerate Entresto.  Had ACEI cough.  - Continue losartan at bed time.  -Farxiga stopped by PCP on 04/22/19. Eventually will need to try and restart.  - Check BMET today.  2. CAD: s/p CABG and redo. Had DES to distal LM, angioplasty to ostial LCx in 4/17. Cardiolite 11/17 with scar, no ischemia.   - No chest pain.  - Continue statin.  - He is off  ASA/Plavix and taking apixaban 5 mg bid.  - Continue Coreg and ranolazine 500 mg bid.  3. CKD stage IIIa: Check BMET 4. Hyperlipidemia: Continue statin. Good lipids in 8/21.  5. Atrial fibrillation: Paroxysmal atrial fibrillation.  No A fib on device interrogation.  - Continue apixaban.   6. Orthostatic BP Improving with medications changes. See above.   Follow up in 4 weeks. I have asked his wife to continue monitoring orthostatic bps daily/weights. Eventually try to restart farxiga.    Daniell Mancinas NP-C  04/27/2020

## 2020-04-27 ENCOUNTER — Encounter (HOSPITAL_COMMUNITY): Payer: Self-pay

## 2020-04-27 ENCOUNTER — Other Ambulatory Visit: Payer: Self-pay

## 2020-04-27 ENCOUNTER — Ambulatory Visit (HOSPITAL_COMMUNITY)
Admission: RE | Admit: 2020-04-27 | Discharge: 2020-04-27 | Disposition: A | Discharge status: Home / Self Care | Payer: Medicare HMO | Source: Ambulatory Visit | Attending: Cardiology | Admitting: Cardiology

## 2020-04-27 ENCOUNTER — Other Ambulatory Visit (HOSPITAL_COMMUNITY): Payer: Self-pay

## 2020-04-27 VITALS — BP 112/68 | HR 71 | Wt 182.2 lb

## 2020-04-27 DIAGNOSIS — I130 Hypertensive heart and chronic kidney disease with heart failure and stage 1 through stage 4 chronic kidney disease, or unspecified chronic kidney disease: Secondary | ICD-10-CM | POA: Insufficient documentation

## 2020-04-27 DIAGNOSIS — Z794 Long term (current) use of insulin: Secondary | ICD-10-CM | POA: Diagnosis not present

## 2020-04-27 DIAGNOSIS — Z950 Presence of cardiac pacemaker: Secondary | ICD-10-CM | POA: Diagnosis not present

## 2020-04-27 DIAGNOSIS — I480 Paroxysmal atrial fibrillation: Secondary | ICD-10-CM | POA: Diagnosis not present

## 2020-04-27 DIAGNOSIS — I2510 Atherosclerotic heart disease of native coronary artery without angina pectoris: Secondary | ICD-10-CM | POA: Diagnosis not present

## 2020-04-27 DIAGNOSIS — I5022 Chronic systolic (congestive) heart failure: Secondary | ICD-10-CM | POA: Insufficient documentation

## 2020-04-27 DIAGNOSIS — I252 Old myocardial infarction: Secondary | ICD-10-CM | POA: Diagnosis not present

## 2020-04-27 DIAGNOSIS — Z87891 Personal history of nicotine dependence: Secondary | ICD-10-CM | POA: Insufficient documentation

## 2020-04-27 DIAGNOSIS — Z9049 Acquired absence of other specified parts of digestive tract: Secondary | ICD-10-CM | POA: Insufficient documentation

## 2020-04-27 DIAGNOSIS — Z955 Presence of coronary angioplasty implant and graft: Secondary | ICD-10-CM | POA: Insufficient documentation

## 2020-04-27 DIAGNOSIS — I255 Ischemic cardiomyopathy: Secondary | ICD-10-CM | POA: Insufficient documentation

## 2020-04-27 DIAGNOSIS — Z7901 Long term (current) use of anticoagulants: Secondary | ICD-10-CM | POA: Insufficient documentation

## 2020-04-27 DIAGNOSIS — E1122 Type 2 diabetes mellitus with diabetic chronic kidney disease: Secondary | ICD-10-CM | POA: Diagnosis not present

## 2020-04-27 DIAGNOSIS — E785 Hyperlipidemia, unspecified: Secondary | ICD-10-CM | POA: Diagnosis not present

## 2020-04-27 DIAGNOSIS — I951 Orthostatic hypotension: Secondary | ICD-10-CM | POA: Diagnosis not present

## 2020-04-27 DIAGNOSIS — N1831 Chronic kidney disease, stage 3a: Secondary | ICD-10-CM | POA: Insufficient documentation

## 2020-04-27 DIAGNOSIS — Z7902 Long term (current) use of antithrombotics/antiplatelets: Secondary | ICD-10-CM | POA: Diagnosis not present

## 2020-04-27 DIAGNOSIS — Z79899 Other long term (current) drug therapy: Secondary | ICD-10-CM | POA: Insufficient documentation

## 2020-04-27 DIAGNOSIS — Z8249 Family history of ischemic heart disease and other diseases of the circulatory system: Secondary | ICD-10-CM | POA: Diagnosis not present

## 2020-04-27 DIAGNOSIS — Z951 Presence of aortocoronary bypass graft: Secondary | ICD-10-CM | POA: Insufficient documentation

## 2020-04-27 LAB — BASIC METABOLIC PANEL
Anion gap: 7 (ref 5–15)
BUN: 21 mg/dL (ref 8–23)
CO2: 23 mmol/L (ref 22–32)
Calcium: 9 mg/dL (ref 8.9–10.3)
Chloride: 103 mmol/L (ref 98–111)
Creatinine, Ser: 1.28 mg/dL — ABNORMAL HIGH (ref 0.61–1.24)
GFR, Estimated: 58 mL/min — ABNORMAL LOW (ref >60)
Glucose, Bld: 114 mg/dL — ABNORMAL HIGH (ref 70–99)
Potassium: 5.1 mmol/L (ref 3.5–5.1)
Sodium: 133 mmol/L — ABNORMAL LOW (ref 135–145)

## 2020-04-27 MED ORDER — EPLERENONE 25 MG PO TABS
25.0000 mg | ORAL_TABLET | Freq: Every day | ORAL | 3 refills | Status: DC
Start: 2020-04-27 — End: 2020-08-10

## 2020-04-27 NOTE — Patient Instructions (Addendum)
DECREASE Eplerenone to 25 mg, one tab daily at bedtime   Labs today We will only contact you if something comes back abnormal or we need to make some changes. Otherwise no news is good news!  Your physician recommends that you schedule a follow-up appointment in: 4 weeks  If you have any questions or concerns before your next appointment please send [place] a message through [place] or call our office at [phone].    TO LEAVE A MESSAGE FOR THE NURSE SELECT OPTION 2, PLEASE LEAVE A MESSAGE INCLUDING: . YOUR NAME . DATE OF BIRTH . CALL BACK NUMBER . REASON FOR CALL**this is important as we prioritize the call backs  YOU WILL RECEIVE A CALL BACK THE SAME DAY AS LONG AS YOU CALL BEFORE 4:00 PM

## 2020-05-05 DIAGNOSIS — N179 Acute kidney failure, unspecified: Secondary | ICD-10-CM | POA: Diagnosis not present

## 2020-05-13 ENCOUNTER — Ambulatory Visit (INDEPENDENT_AMBULATORY_CARE_PROVIDER_SITE_OTHER): Payer: Medicare HMO

## 2020-05-13 DIAGNOSIS — I255 Ischemic cardiomyopathy: Secondary | ICD-10-CM

## 2020-05-13 LAB — CUP PACEART REMOTE DEVICE CHECK
Battery Remaining Longevity: 43 mo
Battery Voltage: 2.96 V
Brady Statistic AP VP Percent: 46.68 %
Brady Statistic AP VS Percent: 0.06 %
Brady Statistic AS VP Percent: 52.45 %
Brady Statistic AS VS Percent: 0.8 %
Brady Statistic RA Percent Paced: 45.07 %
Brady Statistic RV Percent Paced: 97.41 %
Date Time Interrogation Session: 20220127053324
HighPow Impedance: 39 Ohm
HighPow Impedance: 52 Ohm
Implantable Lead Implant Date: 20111003
Implantable Lead Implant Date: 20111003
Implantable Lead Implant Date: 20130509
Implantable Lead Location: 753858
Implantable Lead Location: 753859
Implantable Lead Location: 753860
Implantable Lead Model: 4196
Implantable Lead Model: 5076
Implantable Lead Model: 6947
Implantable Pulse Generator Implant Date: 20191030
Lead Channel Impedance Value: 342 Ohm
Lead Channel Impedance Value: 342 Ohm
Lead Channel Impedance Value: 399 Ohm
Lead Channel Impedance Value: 513 Ohm
Lead Channel Impedance Value: 646 Ohm
Lead Channel Impedance Value: 665 Ohm
Lead Channel Pacing Threshold Amplitude: 0.625 V
Lead Channel Pacing Threshold Amplitude: 0.875 V
Lead Channel Pacing Threshold Amplitude: 2 V
Lead Channel Pacing Threshold Pulse Width: 0.4 ms
Lead Channel Pacing Threshold Pulse Width: 0.4 ms
Lead Channel Pacing Threshold Pulse Width: 0.4 ms
Lead Channel Sensing Intrinsic Amplitude: 1.125 mV
Lead Channel Sensing Intrinsic Amplitude: 1.125 mV
Lead Channel Sensing Intrinsic Amplitude: 31.625 mV
Lead Channel Sensing Intrinsic Amplitude: 31.625 mV
Lead Channel Setting Pacing Amplitude: 1.5 V
Lead Channel Setting Pacing Amplitude: 2 V
Lead Channel Setting Pacing Amplitude: 4 V
Lead Channel Setting Pacing Pulse Width: 0.4 ms
Lead Channel Setting Pacing Pulse Width: 0.4 ms
Lead Channel Setting Sensing Sensitivity: 0.3 mV

## 2020-05-24 ENCOUNTER — Ambulatory Visit (INDEPENDENT_AMBULATORY_CARE_PROVIDER_SITE_OTHER): Payer: Medicare HMO

## 2020-05-24 DIAGNOSIS — I5022 Chronic systolic (congestive) heart failure: Secondary | ICD-10-CM

## 2020-05-24 DIAGNOSIS — Z95810 Presence of automatic (implantable) cardiac defibrillator: Secondary | ICD-10-CM | POA: Diagnosis not present

## 2020-05-24 NOTE — Progress Notes (Signed)
Remote ICD transmission.   

## 2020-05-25 NOTE — Progress Notes (Signed)
EPIC Encounter for ICM Monitoring  Patient Name: [name] is a 80 y.o. male Date: 05/25/2020 Primary Care Physican: Brandon Neer, MD Primary Cardiologist:[name] Electrophysiologist: [name] Bi-V Pacing: 97.3% 2/8/2022OfficeWeight: 179lbs  Time in AT/AF0.0 hr/day (0.0%)  Spoke with wife and patient.  Pt has stomach bloating that comes and goes, slight ankle swelling and wheezing for past 2 weeks.  He has dizziness and orthostatic hypotension.          As instructed by Dr [name], they are doing orthostatic BPs at home.        Dizziness and orthostatic continue after Dr [name] stopped [name] and following BP readings since stopping:      05/03/2020 BP Sitting 132/72 HR 70:  Standing 82/56 HR 72       05/20/2020 BP sitting 129/70 HR 69:  Standing 96/62 HR 77          05/23/2020 BP sitting 140/80 HR 72:  Standing 119/67 HR 75  OptiVolThoracicimpedance suggesting possible fluid accumulation starting 04/22/2020 which correlates with Furosemide being stopped on 04/21/2020.  Prescribed:  No longer prescribed Furosemide as of 04/21/2020  Labs: 04/27/2020 Creatinine 1.28, BUN 21, Potassium 5.1, Sodium 133, GFR 58 03/16/2020 Creatinine 1.62. BUN 22, Potassium 3.5, Sodium 135, GFR 43 12/03/2019 Creatinine 1.66, BUN 30, Potassium 4.4, Sodium 139, GFR 40-49 10/29/2019 Creatinine 1.75, BUN 32, Potassium 4.1, Sodium 133, GFR 37-43 10/02/2019 Creatinine1.66, BUN33, Potassium4.3, [id number], [id number] 09/18/2019 Creatinine1.99, BUN39, Potassium3.9, Sodium136, GFR31-36  06/18/2019 Creatinine1.63, BUN26, Potassium3.8, [id number], [id number] A complete set of results can be found in Results Review.  Recommendations: Per wife, Dr [name] stopped Furosemide and Farxiga on 04/21/20 due to kidney function and dizziness.    Ranexa dosage decreased to 500 mg bid.    Follow-up plan: ICM clinic phone appointment on2/14/2022 to recheck fluid levels. 91 day  device clinic remote transmission4/28/2022.   EP/Cardiology Office Visits:06/01/2020 with HF clinic PA/NP.  06/15/2020 with Dr.McLean.Recall 12/02/2020 with Brandon Kilts, PA.  Copy of ICM check sent to Dr.Allred and Dr [name] for review and recommendations if needed.  3 month ICM trend: 05/24/2020.    1 Year ICM trend:       Brandon Billings, RN 05/25/2020 12:41 PM

## 2020-05-26 MED ORDER — CARVEDILOL 3.125 MG PO TABS
3.1250 mg | ORAL_TABLET | Freq: Two times a day (BID) | ORAL | 2 refills | Status: DC
Start: 2020-05-26 — End: 2020-06-29

## 2020-05-26 MED ORDER — FUROSEMIDE 40 MG PO TABS: ORAL_TABLET | ORAL | 3 refills | Status: DC

## 2020-05-26 NOTE — Progress Notes (Signed)
Laurie: Decrease Coreg to 3.125 mg bid.  Restart Lasix at prior dosing which was 80 qam/40 qpm.  BMET 1 week.   Heather/Philicia: Needs followup in clinic with me or APP within the next 2 weeks

## 2020-05-26 NOTE — Progress Notes (Signed)
Attempted call to wife and patient.  Patient stated wife was not home.  Will call back later today to provide recommendations.

## 2020-05-26 NOTE — Progress Notes (Signed)
Spoke with wife.  Advised Dr Aundra Dubin recommended to restarting Furosemide at previous dosage of 80 mg every AM and 40 mg every PM.  Also to decrease Coreg to 3.125 mg 1 tablet twice a day. Advised will send in new prescriptions for both.  He should stop the current coreg dosage of 6.25 mg.  He can have the BMET drawn next week at 2/15 HF clinic appointment.   She verbalized understanding and agreed to plan.

## 2020-05-31 NOTE — Progress Notes (Signed)
Advanced Heart Failure Clinic Note   Patient ID: [name], male   DOB: 11/27/1940, 80 y.o.   MRN: [id number] PCP: Dr. Lynnda Child Cardiology: Dr. Aundra Dubin  3 y.o. with history of CAD s/p CABG and redo CABG as well as ischemic cardiomyopathy with CRT-D device presents for followup of CHF and CAD.  He had a Cardiolite in the 4/15 showing lateral wall ischemia.  LHC at that time showed all his vein grafts from both CABG surgeries occluded.  The LIMA-LAD was patent and there was a 90% distal LM stenosis.  The lateral ischemia likely corresponded to LCx territory downstream from the LM stenosis.  PCI of the distal LM was thought to be high risk and characteristics of the lesion were not favorable for PCI.  Last echo showed EF 25-30% with moderate RV systolic dysfunction.  He had RHC in 11/15 with normal filling pressures and relatively preserved cardiac index (2.55).    In 4/17, he was admitted with chest pain concerning for unstable angina.  Angiography showed 85% distal LM with 90% ostial LCx stenosis.  LIMA was patent but proximal LAD, proximal RCA, and all SVGs were totally occluded. High had successful DES to LM and PTCA to ostial LCx with Impella support.  [name] was stopped and he was put back on low dose lisinopril due to symptomatic hypotension.  He had Cardiolite in 11/17 with scar, no ischemia.   He was admitted in 1/19 with AKI, creatinine up to 3. Meds held then restarted.   Echo 3/19 with EF 30-35%.   He was admitted with syncope and AKI in 6/19.  He was thought to be dehydrated and orthostatic. Losartan stopped but eventually restarted.   He was noted to have prolonged atrial fibrillation by device check in 3/20, now on Eliquis.   Echo was done in 6/20, showing EF 25-30% with inferior/inferolateral AK, moderate LV dilation, mild LVH, mild MR, severe LAE.    CPX in 11/20 showed moderate functional limitation due to HF.   He went into atrial fibrillation in 6/21, had TEE-guided DCCV  in 6/21 back to NSR.  TEE showed EF 25-30%, moderately dilated LV, normal RV.   Saw PCP on 04/21/20 and lasix + farxiga stopped due to othostatic  hypotension. SBP dropped to the 70s when standing. Also ranexa was cut back from 1000-->500 mg. His wife has been checking orthostatics pressures. Todays reading--> Lying 123/64 Sitting 128/65 Standing 101/58   At clinic follow up on 1/22 having issue with low blood pressure and dizziness when standing. Not very active at home.  Weight at home 176-178  pounds. Eplerenone decreased to 25 mg and scheduled at bedtime.  Today he returns for HF follow up. Around 05/24/20 OptiVol was trending up, Coreg decreased to 3.125 mg bid and lasix added back 80 mg q AM/40 mg q PM. [place] last month, did not hit head. Overall feeling fine. SOB with steps or walking up stairs, says this is his baseline. Wife has been checking orthostatics at home, BP drops when standing today--> 137/81 HR 82 sitting, 94/69 HR 84 standing. Denies increasing SOB, CP, edema, or PND/Orthopnea. Appetite ok. No fever or chills. Weight at home ~179-180 pounds. Taking all medications. Wife says patient gets weak and will drink a coke for the sugar spike.   Medtronic device interrogation: Fluid index high. Impedance below threshold. >99% V-pacing, 50-65% A-pacing, Activity < 1 hour/day, no VT/VF, one episode of afib around 05/11/20 lasting less than 1 hour (personally reviewed).  Labs (9/15): LDL particle number 816, LDL 57, LFTs normal Labs (10/15): K 4.4, creatinine 1.4 Labs (11/15): K 4.3, creatinine 1.36 Labs (12/15): K 4.8, creatinine 1.34 Labs (3/16): K 4, creatinine 1.38, LDL 46, HDl 35 Labs (7/16): K 4, creatinine 1.39, BNP 211 Labs (03/03/15): K 4.1, creatinine 1.47, BNP 227, LDL 80, HDL 32 Labs (12/16): K 4.6, creatinine 1.12, LDL 68, HDL 34 Labs (4/17): K 3.4, creatinine 1.15 Labs (6/17): K 3.8, creatinine 1.67 Labs (7/17): K 4, creatinine 1.35 Labs (10/17): K 3.9, creatinine 1.31, LDL 45,  HDL 30 Labs (2/18): K 4, creatinine 1.43 Labs (5/18): LDL 44, HDL 27 Labs (6/18): K 4.1, creatinine 1.49 Labs (12/18): K 4.4, creatinine 1.44 Labs (1/19): K 4.3, creatinine 1.48 Labs (2/19): K 3.7, creatinine 1.6 Labs (6/19): K 4, creatinine 1.5 Labs (9/19): K 4.4, creatinine 1.6, hgb 13.4 Labs (10/19): K 4.3, creatinine 1.44 Labs (1/20): K 4, creatinine 1.65 Labs (6/20): K 3.9, creatinine 1.58, LDL 58, HDL 31, hgb 13.3 Labs (7/20): K 3.9, creatinine 1.5 Labs (10/20): K 4, creatinine 1.7 Labs (12/20): K 3.9, creatinine 1.56 Labs (5/21): K 4.1, creatinine 1.7 Labs (6/21): HDL 36, LDL 51 Labs (8/21): K 4.4, creatinine 1.66, LDL 57 Labs (1/22): K 5.1, creatinine 1.28 Labs (2/22): K 3.9, creatinine 1.55, bnp 128  PMH: 1. Gout 2. Hyperlipidemia 3. CAD: CABG 1997 and redo 11/12.   - LHC (4/15) with totally occluded LAD, totally occluded RCA, 80% distal LM, 50% mLCx, 2 SVG-RCA grafts totally occluded, 2 SVG-OM grafts totally occluded, patent LIMA-LAD.  Cardiolite prior to 4/15 cath showed lateral wall ischemia (LCx territory).  PCI to distal LM would be a high risk procedure.   - Cardiolite (8/16) with EF 20%, severe scar in RCA and probably LCx territory, minimal peri-infarct ischemia.   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  - Unstable angina 4/17: 85% distal LM with 90% ostial LCx stenosis.  LIMA was patent but proximal LAD, proximal RCA, and all SVGs were totally occluded. He had successful DES to LM and PTCA to ostial LCx with Impella support  - Cardiolite (11/17): EF 20%, prior MI, no ischemia.  4. Ischemic Cardiomyopathy: Medtronic CRT-D device.   - Echo (6/15) with EF 25-30%, moderate LV dilation, inferior and inferolateral akinesis, moderately decreased RV systolic function, mild MR.   - RHC (11/15) with mean RA 5, PA 23/6, mean PCWP 9, CI 2.55.  - Echo (4/17): EF 25-30% with mild MR.  - Echo (11/17): EF 25-30%, moderately dilated LV, grade II  diastolic dysfunction, mild-moderate MR, mildly decreased RV systolic function.  - Echo (3/19): EF 30-35%, inferior/inferolateral akinesis, normal RV size with mildly decreased systolic function.  - Echo (6/20): EF 25-30% with inferior/inferolateral AK, moderate LV dilation, mild LVH, mild MR, severe LAE. - CPX (11/20): VO2 max 14.9 (60% predicted), VE/VCO2 slope 34, RER 1.05.  Moderate functional deficit due to HF.  - TEE (6/21): EF 25-30%, moderately dilated LV, normal RV. 5. H/o cholecystectomy 6. OA 7. Depression 8. Type II diabetes 9. GERD 10. CKD stage 3 11. ? Early dementia 12. Atrial fibrillation: Paroxysmal.  - DCCV to NSR in 6/21.  13. B12 deficiency.   SH: Married, prior smoker (many years ago), lives in [place].    FH: CAD  ROS: All systems reviewed and negative except as per HPI.   Current Outpatient Medications  Medication Sig Dispense Refill  . acetaminophen (TYLENOL) 500 MG tablet Take 1,000 mg by mouth every 6 (  six) hours as needed for moderate pain.    Marland Kitchen allopurinol (ZYLOPRIM) 100 MG tablet Take 200 mg by mouth daily.    Marland Kitchen atorvastatin (LIPITOR) 80 MG tablet TAKE 1 TABLET BY MOUTH ONCE DAILY AT  6PM  IN  THE  EVENING 90 tablet 3  . carvedilol (COREG) 3.125 MG tablet Take 1 tablet (3.125 mg total) by mouth 2 (two) times daily. 180 tablet 2  . Continuous Blood Gluc Sensor (FREESTYLE LIBRE 14 DAY SENSOR) MISC USE TO CHECK GLUCOSE THREE TIMES DAILY AS DIRECTED    . ELIQUIS 5 MG TABS tablet Take 1 tablet by mouth twice daily 60 tablet 5  . eplerenone (INSPRA) 25 MG tablet Take 1 tablet (25 mg total) by mouth at bedtime. 30 tablet 3  . furosemide (LASIX) 40 MG tablet Take 2 tablets (80 mg total) by mouth every morning AND 1 tablet (40 mg total) every evening. 540 tablet 3  . Homeopathic Products [facility] ALLERGY EYE RELIEF OP) Place 1 drop into both eyes daily as needed (irritation).    . Insulin Isophane & Regular Human (NOVOLIN 70/30 FLEXPEN) (70-30) 100 UNIT/ML  PEN Inject 40 Units into the skin daily before lunch.     . loperamide (IMODIUM A-D) 2 MG tablet Take 2 mg by mouth 4 (four) times daily as needed for diarrhea or loose stools.    Marland Kitchen losartan (COZAAR) 25 MG tablet TAKE 1/2 (ONE-HALF) TABLET BY MOUTH ONCE DAILY AT BEDTIME 45 tablet 2  . nitroGLYCERIN (NITROSTAT) 0.4 MG SL tablet Place 1 tablet (0.4 mg total) under the tongue every 5 (five) minutes as needed for chest pain. DO NOT EXCEED A TOTAL OF 3 DOSES IN 15 MINUTES 25 tablet 6  . QUEtiapine (SEROQUEL) 100 MG tablet Take 100 mg by mouth at bedtime. Taking 0.5 tablet at bedtime    . ranolazine (RANEXA) 500 MG 12 hr tablet Take 500 mg by mouth 2 (two) times daily.    Marland Kitchen triamcinolone (KENALOG) 0.147 MG/GM topical spray Apply 1 spray topically See admin instructions. Use after each shower    . triamcinolone cream (KENALOG) 0.1 % Apply 1 application topically 2 (two) times daily as needed (for dry/irritated skin.).    Marland Kitchen venlafaxine (EFFEXOR-XR) 150 MG 24 hr capsule Take 150 mg by mouth daily with breakfast.    . vitamin B-12 (CYANOCOBALAMIN) 1000 MCG tablet Take 1,000 mcg by mouth daily.     No current facility-administered medications for this encounter.   BP 119/78   Pulse 84   Wt 81 kg   SpO2 96%   BMI 26.37 kg/m    Wt Readings from Last 3 Encounters:  06/01/20 81 kg  04/27/20 82.6 kg  03/16/20 82.6 kg   Physical Exam: General:  NAD. No resp difficulty, elderly. HEENT: HOH Neck: Supple. JVP 6-7. Carotids 2+ bilat; no bruits. No lymphadenopathy or thryomegaly appreciated. Cor: PMI nondisplaced. Regular rate & rhythm. No rubs, gallops or murmurs. Lungs: Clear Abdomen: Soft, nontender, nondistended. No hepatosplenomegaly. No bruits or masses. Good bowel sounds. Extremities: No cyanosis, clubbing, rash, edema Neuro: alert & oriented x 3, cranial nerves grossly intact. Moves all 4 extremities w/o difficulty. Affect pleasant.  Assessment/Plan: 1. Chronic systolic CHF: Ischemic  cardiomyopathy.  Echo in 6/20 showed EF 25-30%. He has a Medtronic CRT-D device.  CPX in 11/20 showed moderate functional limitation due to CHF.  TEE in 6/21 with EF 25-30%, normal RV.  - NYHA II-III. Volume status ok on exam, however OptiVol suggests fluid still up.  Will do another device transmission in 1-2 weeks to follow trend. - Continue lasix 80 mg AM/40 mg PM. - Continue Coreg 3.125 mg bid. Will not titrate due to orthostatic symptoms (chronically).    - Continue eplerenone 25 mg at bedtime. May need to back down to 12.5 if orthostatic symptoms continue.   - Continue losartan 12.5 mg at bedtime.  - BP did not tolerate Entresto.  Had ACEI cough.  [name] stopped by PCP on 04/22/19. Eventually will need to try and restart. Will hold off today.  - Check BMET, BNP today.  2. CAD: s/p CABG and redo. Had DES to distal LM, angioplasty to ostial LCx in 4/17. Cardiolite 11/17 with scar, no ischemia.   - No chest pain.  - Continue statin.  - He is off ASA/Plavix and taking apixaban 5 mg bid.  - Continue carvedilol and ranolazine 500 mg bid.  3. CKD stage IIIa: Check BMET. 4. Hyperlipidemia: Continue statin. Good lipids in 8/21. Will re-check at next visit. 5. Atrial fibrillation: Paroxysmal atrial fibrillation. Regular on exam. No significant A-fib on device interrogation.  - Continue apixaban.  No bleeding issues. 6. Orthostatic BP - Seems to be improving. Asked wife to continue to check orthostatics and add an additional check after he has been standing for 2-3 minutes.  - ? If blood sugar spikes and dips are contributing to symptoms, or post-prandial hypotension. - Hold off starting [place]. 7. DMII: on insulin 70/30.  A1C (6/19) 10.2. Check A1C today.  Follow up in 4 weeks. I have asked his wife to continue monitoring orthostatic bps daily/weights. Eventually try to restart farxiga.   Maricela Bo [facility] FNP-BC  06/01/2020

## 2020-06-01 ENCOUNTER — Other Ambulatory Visit: Payer: Self-pay

## 2020-06-01 ENCOUNTER — Ambulatory Visit (HOSPITAL_COMMUNITY)
Admission: RE | Admit: 2020-06-01 | Discharge: 2020-06-01 | Disposition: A | Discharge status: Home / Self Care | Payer: Medicare HMO | Source: Ambulatory Visit | Attending: Cardiology | Admitting: Cardiology

## 2020-06-01 ENCOUNTER — Encounter (HOSPITAL_COMMUNITY): Payer: Self-pay

## 2020-06-01 VITALS — BP 119/78 | HR 84 | Wt 178.6 lb

## 2020-06-01 DIAGNOSIS — Z7901 Long term (current) use of anticoagulants: Secondary | ICD-10-CM | POA: Diagnosis not present

## 2020-06-01 DIAGNOSIS — I951 Orthostatic hypotension: Secondary | ICD-10-CM | POA: Diagnosis not present

## 2020-06-01 DIAGNOSIS — R0602 Shortness of breath: Secondary | ICD-10-CM | POA: Insufficient documentation

## 2020-06-01 DIAGNOSIS — I255 Ischemic cardiomyopathy: Secondary | ICD-10-CM | POA: Diagnosis not present

## 2020-06-01 DIAGNOSIS — Z8249 Family history of ischemic heart disease and other diseases of the circulatory system: Secondary | ICD-10-CM | POA: Diagnosis not present

## 2020-06-01 DIAGNOSIS — I5042 Chronic combined systolic (congestive) and diastolic (congestive) heart failure: Secondary | ICD-10-CM

## 2020-06-01 DIAGNOSIS — N1831 Chronic kidney disease, stage 3a: Secondary | ICD-10-CM | POA: Diagnosis not present

## 2020-06-01 DIAGNOSIS — E785 Hyperlipidemia, unspecified: Secondary | ICD-10-CM | POA: Diagnosis not present

## 2020-06-01 DIAGNOSIS — Z87891 Personal history of nicotine dependence: Secondary | ICD-10-CM | POA: Insufficient documentation

## 2020-06-01 DIAGNOSIS — R059 Cough, unspecified: Secondary | ICD-10-CM | POA: Insufficient documentation

## 2020-06-01 DIAGNOSIS — Z794 Long term (current) use of insulin: Secondary | ICD-10-CM | POA: Insufficient documentation

## 2020-06-01 DIAGNOSIS — E1122 Type 2 diabetes mellitus with diabetic chronic kidney disease: Secondary | ICD-10-CM

## 2020-06-01 DIAGNOSIS — Z7902 Long term (current) use of antithrombotics/antiplatelets: Secondary | ICD-10-CM | POA: Insufficient documentation

## 2020-06-01 DIAGNOSIS — Z79899 Other long term (current) drug therapy: Secondary | ICD-10-CM | POA: Diagnosis not present

## 2020-06-01 DIAGNOSIS — I480 Paroxysmal atrial fibrillation: Secondary | ICD-10-CM | POA: Diagnosis not present

## 2020-06-01 DIAGNOSIS — I2510 Atherosclerotic heart disease of native coronary artery without angina pectoris: Secondary | ICD-10-CM | POA: Diagnosis not present

## 2020-06-01 DIAGNOSIS — Z951 Presence of aortocoronary bypass graft: Secondary | ICD-10-CM | POA: Diagnosis not present

## 2020-06-01 DIAGNOSIS — Z955 Presence of coronary angioplasty implant and graft: Secondary | ICD-10-CM | POA: Insufficient documentation

## 2020-06-01 DIAGNOSIS — E1169 Type 2 diabetes mellitus with other specified complication: Secondary | ICD-10-CM

## 2020-06-01 LAB — BASIC METABOLIC PANEL
Anion gap: 11 (ref 5–15)
BUN: 24 mg/dL — ABNORMAL HIGH (ref 8–23)
CO2: 27 mmol/L (ref 22–32)
Calcium: 8.6 mg/dL — ABNORMAL LOW (ref 8.9–10.3)
Chloride: 101 mmol/L (ref 98–111)
Creatinine, Ser: 1.55 mg/dL — ABNORMAL HIGH (ref 0.61–1.24)
GFR, Estimated: 46 mL/min — ABNORMAL LOW (ref >60)
Glucose, Bld: 168 mg/dL — ABNORMAL HIGH (ref 70–99)
Potassium: 3.9 mmol/L (ref 3.5–5.1)
Sodium: 139 mmol/L (ref 135–145)

## 2020-06-01 LAB — BRAIN NATRIURETIC PEPTIDE: B Natriuretic Peptide: 128.4 pg/mL — ABNORMAL HIGH (ref 0.0–100.0)

## 2020-06-01 NOTE — Patient Instructions (Signed)
It was great to see you today! No medication changes are needed at this time.  Labs today We will only contact you if something comes back abnormal or we need to make some changes. Otherwise no news is good news!  Your physician recommends that you schedule a follow-up appointment in: 4 weeks  in the Advanced Practitioners (PA/NP) Clinic and in 3-4 months with Dr Aundra Dubin  Do the following things EVERYDAY: 1) Weigh yourself in the morning before breakfast. Write it down and keep it in a log. 2) Take your medicines as prescribed 3) Eat low salt foods--Limit salt (sodium) to 2000 mg per day.  4) Stay as active as you can everyday 5) Limit all fluids for the day to less than 2 liters  At the [facility], you and your health needs are our priority. As part of our continuing mission to provide you with exceptional heart care, we have created designated Provider Care Teams. These Care Teams include your primary Cardiologist (physician) and Advanced Practice Providers (APPs- Physician Assistants and Nurse Practitioners) who all work together to provide you with the care you need, when you need it.   You may see any of the following providers on your designated Care Team at your next follow up: Marland Kitchen Dr Glori Bickers . Dr [name] . Darrick Grinder, NP . Lyda Jester, [place] . Audry Riles, PharmD   Please be sure to bring in all your medications bottles to every appointment.

## 2020-06-01 NOTE — Progress Notes (Signed)
Received following message 06/01/2020 from Allena Katz, NP at advanced HF clinic:  Received: Today [place], Maricela Bo, FNP  Clementina Mareno Panda, RN Hi Margarita Grizzle,  I saw Kainen today in clinic and on exam his fluid looks great. Is OptiVol is still up, wondering if it is lagging a day or two behind. Would you mind doing another transmission in 7-10 days to see if it starts trending back down? I am keeping him on his lasix, but ideally would like to back off a little bit because he is orthostatic.  Thanks!  Allena Katz, FNP     ICM remote transmission scheduled for 06/14/2020 for fluid level recheck.

## 2020-06-14 ENCOUNTER — Ambulatory Visit (INDEPENDENT_AMBULATORY_CARE_PROVIDER_SITE_OTHER): Payer: Medicare HMO

## 2020-06-14 ENCOUNTER — Telehealth: Payer: Self-pay

## 2020-06-14 DIAGNOSIS — I5042 Chronic combined systolic (congestive) and diastolic (congestive) heart failure: Secondary | ICD-10-CM | POA: Diagnosis not present

## 2020-06-14 DIAGNOSIS — Z95810 Presence of automatic (implantable) cardiac defibrillator: Secondary | ICD-10-CM

## 2020-06-14 NOTE — Progress Notes (Signed)
EPIC Encounter for ICM Monitoring  Patient Name: [name] is a 80 y.o. male Date: 06/14/2020 Primary Care Physican: Mayra Neer, MD Primary Cardiologist:McLean Electrophysiologist: Allred Bi-V Pacing: 94.5% 2/8/2022OfficeWeight: 179lbs  Since 01-Jun-2020 Time in AT/AF <0.1 hr/day (<0.1%) Longest AT/AF 86 seconds  Attempted call toe and unable to reach.  Transmission reviewed.   OptiVolThoracicimpedance suggesting improvement since 2/10 but still trending below baseline normal.  Prescribed:  Furosemide 40 mg 2 tablets (80 mg total) every AM and 1 tablet (40 mg total).  Labs: 06/01/2020 Creatinine 1.55, BUN 24, Potassium 3.9, Sodium 139, GFR 46 04/27/2020 Creatinine 1.28, BUN 21, Potassium 5.1, Sodium 133, GFR 58 03/16/2020 Creatinine 1.62. BUN 22, Potassium 3.5, Sodium 135, GFR 43 12/03/2019 Creatinine 1.66, BUN 30, Potassium 4.4, Sodium 139, GFR 40-49 10/29/2019 Creatinine 1.75, BUN 32, Potassium 4.1, Sodium 133, GFR 37-43 A complete set of results can be found in Results Review.  Recommendations:  Unable to reach.  Patient OV scheduled visit on 06/15/2020 with Dr Aundra Dubin and recommendations will be given at that time.  Follow-up plan: ICM clinic phone appointment on3/15/2022. 91 day device clinic remote transmission4/28/2022.   EP/Cardiology Office Visits:06/15/2020 with HF clinic.  06/29/2020 with HF clinic PA/NP.  Recall 12/02/2020 with [name], PA.  Copy of ICM check sent to Dr.Allred.   3 month ICM trend: 06/14/2020.    1 Year ICM trend:       [name], RN 06/14/2020 12:14 PM

## 2020-06-14 NOTE — Telephone Encounter (Signed)
Remote ICM transmission received.  Attempted call to wife regarding ICM remote transmission and no answer.

## 2020-06-15 ENCOUNTER — Encounter (HOSPITAL_COMMUNITY): Payer: Medicare HMO | Admitting: Cardiology

## 2020-06-29 ENCOUNTER — Ambulatory Visit (HOSPITAL_COMMUNITY)
Admission: RE | Admit: 2020-06-29 | Discharge: 2020-06-29 | Disposition: A | Discharge status: Home / Self Care | Payer: Medicare HMO | Source: Ambulatory Visit | Attending: Cardiology | Admitting: Cardiology

## 2020-06-29 ENCOUNTER — Encounter (HOSPITAL_COMMUNITY): Payer: Self-pay

## 2020-06-29 ENCOUNTER — Ambulatory Visit (INDEPENDENT_AMBULATORY_CARE_PROVIDER_SITE_OTHER): Payer: Medicare HMO

## 2020-06-29 ENCOUNTER — Other Ambulatory Visit: Payer: Self-pay

## 2020-06-29 VITALS — BP 122/68 | HR 75 | Wt 179.8 lb

## 2020-06-29 DIAGNOSIS — Z87891 Personal history of nicotine dependence: Secondary | ICD-10-CM | POA: Insufficient documentation

## 2020-06-29 DIAGNOSIS — Z955 Presence of coronary angioplasty implant and graft: Secondary | ICD-10-CM | POA: Diagnosis not present

## 2020-06-29 DIAGNOSIS — I2510 Atherosclerotic heart disease of native coronary artery without angina pectoris: Secondary | ICD-10-CM | POA: Diagnosis not present

## 2020-06-29 DIAGNOSIS — I951 Orthostatic hypotension: Secondary | ICD-10-CM | POA: Diagnosis not present

## 2020-06-29 DIAGNOSIS — I255 Ischemic cardiomyopathy: Secondary | ICD-10-CM | POA: Diagnosis not present

## 2020-06-29 DIAGNOSIS — I5042 Chronic combined systolic (congestive) and diastolic (congestive) heart failure: Secondary | ICD-10-CM

## 2020-06-29 DIAGNOSIS — Z95810 Presence of automatic (implantable) cardiac defibrillator: Secondary | ICD-10-CM | POA: Diagnosis not present

## 2020-06-29 DIAGNOSIS — Z8249 Family history of ischemic heart disease and other diseases of the circulatory system: Secondary | ICD-10-CM | POA: Insufficient documentation

## 2020-06-29 DIAGNOSIS — E1122 Type 2 diabetes mellitus with diabetic chronic kidney disease: Secondary | ICD-10-CM | POA: Diagnosis not present

## 2020-06-29 DIAGNOSIS — Z951 Presence of aortocoronary bypass graft: Secondary | ICD-10-CM | POA: Insufficient documentation

## 2020-06-29 DIAGNOSIS — I480 Paroxysmal atrial fibrillation: Secondary | ICD-10-CM | POA: Diagnosis not present

## 2020-06-29 DIAGNOSIS — I5022 Chronic systolic (congestive) heart failure: Secondary | ICD-10-CM | POA: Diagnosis not present

## 2020-06-29 DIAGNOSIS — Z794 Long term (current) use of insulin: Secondary | ICD-10-CM | POA: Diagnosis not present

## 2020-06-29 DIAGNOSIS — Z7901 Long term (current) use of anticoagulants: Secondary | ICD-10-CM | POA: Diagnosis not present

## 2020-06-29 DIAGNOSIS — E785 Hyperlipidemia, unspecified: Secondary | ICD-10-CM | POA: Insufficient documentation

## 2020-06-29 DIAGNOSIS — Z79899 Other long term (current) drug therapy: Secondary | ICD-10-CM | POA: Diagnosis not present

## 2020-06-29 DIAGNOSIS — N1831 Chronic kidney disease, stage 3a: Secondary | ICD-10-CM | POA: Insufficient documentation

## 2020-06-29 LAB — HEMOGLOBIN A1C
Hgb A1c MFr Bld: 7.8 % — ABNORMAL HIGH (ref 4.8–5.6)
Mean Plasma Glucose: 177.16 mg/dL

## 2020-06-29 LAB — BASIC METABOLIC PANEL
Anion gap: 4 — ABNORMAL LOW (ref 5–15)
BUN: 35 mg/dL — ABNORMAL HIGH (ref 8–23)
CO2: 30 mmol/L (ref 22–32)
Calcium: 9.6 mg/dL (ref 8.9–10.3)
Chloride: 102 mmol/L (ref 98–111)
Creatinine, Ser: 1.67 mg/dL — ABNORMAL HIGH (ref 0.61–1.24)
GFR, Estimated: 42 mL/min — ABNORMAL LOW (ref >60)
Glucose, Bld: 156 mg/dL — ABNORMAL HIGH (ref 70–99)
Potassium: 4.3 mmol/L (ref 3.5–5.1)
Sodium: 136 mmol/L (ref 135–145)

## 2020-06-29 LAB — CBC
HCT: 41.7 % (ref 39.0–52.0)
Hemoglobin: 14.2 g/dL (ref 13.0–17.0)
MCH: 31.6 pg (ref 26.0–34.0)
MCHC: 34.1 g/dL (ref 30.0–36.0)
MCV: 92.9 fL (ref 80.0–100.0)
Platelets: 245 10*3/uL (ref 150–400)
RBC: 4.49 MIL/uL (ref 4.22–5.81)
RDW: 14.3 % (ref 11.5–15.5)
WBC: 8.1 10*3/uL (ref 4.0–10.5)
nRBC: 0 % (ref 0.0–0.2)

## 2020-06-29 LAB — BRAIN NATRIURETIC PEPTIDE: B Natriuretic Peptide: 160.6 pg/mL — ABNORMAL HIGH (ref 0.0–100.0)

## 2020-06-29 LAB — LIPID PANEL
Cholesterol: 102 mg/dL (ref 0–200)
HDL: 27 mg/dL — ABNORMAL LOW (ref >40)
LDL Cholesterol: 45 mg/dL (ref 0–99)
Total CHOL/HDL Ratio: 3.8 RATIO
Triglycerides: 149 mg/dL (ref <150)
VLDL: 30 mg/dL (ref 0–40)

## 2020-06-29 MED ORDER — DAPAGLIFLOZIN PROPANEDIOL 10 MG PO TABS: 10.0000 mg | ORAL_TABLET | Freq: Every day | ORAL | 11 refills | Status: DC

## 2020-06-29 MED ORDER — METOPROLOL SUCCINATE ER 25 MG PO TB24: 12.5000 mg | ORAL_TABLET | Freq: Every day | ORAL | 11 refills | Status: DC

## 2020-06-29 NOTE — Progress Notes (Signed)
Advanced Heart Failure Clinic Note   Patient ID: [name], male   DOB: April 12, 1941, 80 y.o.   MRN: [id number] PCP: Dr. Lynnda [name] Cardiology: Dr. Aundra [name]  Reason for Visit: f/u for chronic systolic heart failure   80 y.o. with history of CAD s/p CABG and redo CABG as well as ischemic cardiomyopathy with CRT-D device presents for followup of CHF and CAD.  He had a Cardiolite in the 4/15 showing lateral wall ischemia.  LHC at that time showed all his vein grafts from both CABG surgeries occluded.  The LIMA-LAD was patent and there was a 90% distal LM stenosis.  The lateral ischemia likely corresponded to LCx territory downstream from the LM stenosis.  PCI of the distal LM was thought to be high risk and characteristics of the lesion were not favorable for PCI.  Last echo showed EF 25-30% with moderate RV systolic dysfunction.  He had RHC in 11/15 with normal filling pressures and relatively preserved cardiac index (2.55).    In 4/17, he was admitted with chest pain concerning for unstable angina.  Angiography showed 85% distal LM with 90% ostial LCx stenosis.  LIMA was patent but proximal LAD, proximal RCA, and all SVGs were totally occluded. High had successful DES to LM and PTCA to ostial LCx with Impella support.  [name] was stopped and he was put back on low dose lisinopril due to symptomatic hypotension.  He had Cardiolite in 11/17 with scar, no ischemia.   He was admitted in 1/19 with AKI, creatinine up to 3. Meds held then restarted.   Echo 3/19 with EF 30-35%.   He was admitted with syncope and AKI in 6/19.  He was thought to be dehydrated and orthostatic. Losartan stopped but eventually restarted.   He was noted to have prolonged atrial fibrillation by device check in 3/20, now on Eliquis.   Echo was done in 6/20, showing EF 25-30% with inferior/inferolateral AK, moderate LV dilation, mild LVH, mild MR, severe LAE.    CPX in 11/20 showed moderate functional limitation due to HF.    He went into atrial fibrillation in 6/21, had TEE-guided DCCV in 6/21 back to NSR.  TEE showed EF 25-30%, moderately dilated LV, normal RV.   Saw PCP on 04/21/20 and lasix + farxiga stopped due to othostatic hypotension. SBP dropped to the 70s when standing. Also Ranexa was cut back from 1000-->500 mg.   At clinic follow up on 1/22 was having issue with low blood pressure and dizziness when standing. Not very active at home.  Weight at home 176-178  pounds. Eplerenone decreased to 25 mg and scheduled at bedtime.  Around 05/24/20 OptiVol was trending up, Coreg decreased to 3.125 mg bid and lasix added back 80 mg q AM/40 mg q PM.   He presents to clinic today for f/u. Here w/ wife. Continues to struggle w/ low BP today. Baseline SBP 90s-low 100s. Had low reading earlier today in the 80s. Was mildly dizzy momentarily but no syncope/near syncope or fall. He remains fluid overloaded on exam and by Optivol. Fluid index is starting to trend down but remains above threshold. Impedence below reference curve but trending up. No AF on device interrogation. BiV pacing ~99%. Not very active. Activity level < 1 hr/day.   Medtronic device interrogation: Fluid index is starting to trend down but remains above threshold. Impedence below reference curve but trending up. No AF on device interrogation. BiV pacing ~99%. Not very active. Activity level < 1 hr/day. (  personally reviewed).  Labs (9/15): LDL particle number 816, LDL 57, LFTs normal Labs (10/15): K 4.4, creatinine 1.4 Labs (11/15): K 4.3, creatinine 1.36 Labs (12/15): K 4.8, creatinine 1.34 Labs (3/16): K 4, creatinine 1.38, LDL 46, HDl 35 Labs (7/16): K 4, creatinine 1.39, BNP 211 Labs (03/03/15): K 4.1, creatinine 1.47, BNP 227, LDL 80, HDL 32 Labs (12/16): K 4.6, creatinine 1.12, LDL 68, HDL 34 Labs (4/17): K 3.4, creatinine 1.15 Labs (6/17): K 3.8, creatinine 1.67 Labs (7/17): K 4, creatinine 1.35 Labs (10/17): K 3.9, creatinine 1.31, LDL 45, HDL  30 Labs (2/18): K 4, creatinine 1.43 Labs (5/18): LDL 44, HDL 27 Labs (6/18): K 4.1, creatinine 1.49 Labs (12/18): K 4.4, creatinine 1.44 Labs (1/19): K 4.3, creatinine 1.48 Labs (2/19): K 3.7, creatinine 1.6 Labs (6/19): K 4, creatinine 1.5 Labs (9/19): K 4.4, creatinine 1.6, hgb 13.4 Labs (10/19): K 4.3, creatinine 1.44 Labs (1/20): K 4, creatinine 1.65 Labs (6/20): K 3.9, creatinine 1.58, LDL 58, HDL 31, hgb 13.3 Labs (7/20): K 3.9, creatinine 1.5 Labs (10/20): K 4, creatinine 1.7 Labs (12/20): K 3.9, creatinine 1.56 Labs (5/21): K 4.1, creatinine 1.7 Labs (6/21): HDL 36, LDL 51 Labs (8/21): K 4.4, creatinine 1.66, LDL 57 Labs (1/22): K 5.1, creatinine 1.28 Labs (2/22): K 3.9, creatinine 1.55, bnp 128  PMH: 1. Gout 2. Hyperlipidemia 3. CAD: CABG 1997 and redo 11/12.   - LHC (4/15) with totally occluded LAD, totally occluded RCA, 80% distal LM, 50% mLCx, 2 SVG-RCA grafts totally occluded, 2 SVG-OM grafts totally occluded, patent LIMA-LAD.  Cardiolite prior to 4/15 cath showed lateral wall ischemia (LCx territory).  PCI to distal LM would be a high risk procedure.   - Cardiolite (8/16) with EF 20%, severe scar in RCA and probably LCx territory, minimal peri-infarct ischemia.   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  - Unstable angina 4/17: 85% distal LM with 90% ostial LCx stenosis.  LIMA was patent but proximal LAD, proximal RCA, and all SVGs were totally occluded. He had successful DES to LM and PTCA to ostial LCx with Impella support  - Cardiolite (11/17): EF 20%, prior MI, no ischemia.  4. Ischemic Cardiomyopathy: Medtronic CRT-D device.   - Echo (6/15) with EF 25-30%, moderate LV dilation, inferior and inferolateral akinesis, moderately decreased RV systolic function, mild MR.   - RHC (11/15) with mean RA 5, PA 23/6, mean PCWP 9, CI 2.55.  - Echo (4/17): EF 25-30% with mild MR.  - Echo (11/17): EF 25-30%, moderately dilated LV, grade II  diastolic dysfunction, mild-moderate MR, mildly decreased RV systolic function.  - Echo (3/19): EF 30-35%, inferior/inferolateral akinesis, normal RV size with mildly decreased systolic function.  - Echo (6/20): EF 25-30% with inferior/inferolateral AK, moderate LV dilation, mild LVH, mild MR, severe LAE. - CPX (11/20): VO2 max 14.9 (60% predicted), VE/VCO2 slope 34, RER 1.05.  Moderate functional deficit due to HF.  - TEE (6/21): EF 25-30%, moderately dilated LV, normal RV. 5. H/o cholecystectomy 6. OA 7. Depression 8. Type II diabetes 9. GERD 10. CKD stage 3 11. ? Early dementia 12. Atrial fibrillation: Paroxysmal.  - DCCV to NSR in 6/21.  13. B12 deficiency.   SH: Married, prior smoker (many years ago), lives in [place].    FH: CAD  ROS: All systems reviewed and negative except as per HPI.   Current Outpatient Medications  Medication Sig Dispense Refill  . acetaminophen (TYLENOL) 500 MG tablet Take 1,000 mg by  mouth every 6 (six) hours as needed for moderate pain.    Marland Kitchen allopurinol (ZYLOPRIM) 100 MG tablet Take 200 mg by mouth daily.    Marland Kitchen atorvastatin (LIPITOR) 80 MG tablet TAKE 1 TABLET BY MOUTH ONCE DAILY AT  6PM  IN  THE  EVENING 90 tablet 3  . carvedilol (COREG) 3.125 MG tablet Take 1 tablet (3.125 mg total) by mouth 2 (two) times daily. 180 tablet 2  . Continuous Blood Gluc Sensor (FREESTYLE LIBRE 14 DAY SENSOR) MISC USE TO CHECK GLUCOSE THREE TIMES DAILY AS DIRECTED    . ELIQUIS 5 MG TABS tablet Take 1 tablet by mouth twice daily 60 tablet 5  . eplerenone (INSPRA) 25 MG tablet Take 1 tablet (25 mg total) by mouth at bedtime. 30 tablet 3  . furosemide (LASIX) 40 MG tablet Take 2 tablets (80 mg total) by mouth every morning AND 1 tablet (40 mg total) every evening. 540 tablet 3  . Homeopathic Products [facility] ALLERGY EYE RELIEF OP) Place 1 drop into both eyes daily as needed (irritation).    . Insulin Isophane & Regular Human (NOVOLIN 70/30 FLEXPEN) (70-30) 100 UNIT/ML  PEN Inject 40 Units into the skin daily before lunch.     . loperamide (IMODIUM A-D) 2 MG tablet Take 2 mg by mouth 4 (four) times daily as needed for diarrhea or loose stools.    Marland Kitchen losartan (COZAAR) 25 MG tablet TAKE 1/2 (ONE-HALF) TABLET BY MOUTH ONCE DAILY AT BEDTIME 45 tablet 2  . nitroGLYCERIN (NITROSTAT) 0.4 MG SL tablet Place 1 tablet (0.4 mg total) under the tongue every 5 (five) minutes as needed for chest pain. DO NOT EXCEED A TOTAL OF 3 DOSES IN 15 MINUTES 25 tablet 6  . QUEtiapine (SEROQUEL) 100 MG tablet Take 100 mg by mouth at bedtime. Taking 0.5 tablet at bedtime    . ranolazine (RANEXA) 500 MG 12 hr tablet Take 500 mg by mouth 2 (two) times daily.    Marland Kitchen triamcinolone cream (KENALOG) 0.1 % Apply 1 application topically 2 (two) times daily as needed (for dry/irritated skin.).    Marland Kitchen venlafaxine (EFFEXOR-XR) 150 MG 24 hr capsule Take 150 mg by mouth daily with breakfast.    . vitamin B-12 (CYANOCOBALAMIN) 1000 MCG tablet Take 1,000 mcg by mouth daily.     No current facility-administered medications for this encounter.   BP 122/68   Pulse 75   Wt 81.6 kg (179 lb 12.8 oz)   SpO2 96%   BMI 26.55 kg/m    Wt Readings from Last 3 Encounters:  06/29/20 81.6 kg (179 lb 12.8 oz)  06/01/20 81 kg (178 lb 9.6 oz)  04/27/20 82.6 kg (182 lb 3.2 oz)   PHYSICAL EXAM: General:  Well appearing, elderly. No respiratory difficulty HEENT: normal Neck: supple. JVD elevated to jaw + HJR. Carotids 2+ bilat; no bruits. No lymphadenopathy or thyromegaly appreciated. Cor: PMI nondisplaced. Regular rate & rhythm. No rubs, gallops or murmurs. Lungs: clear Abdomen: soft, nontender, nondistended. No hepatosplenomegaly. No bruits or masses. Good bowel sounds. Extremities: no cyanosis, clubbing, rash, edema Neuro: alert & oriented x 3, cranial nerves grossly intact. moves all 4 extremities w/o difficulty. Affect pleasant.   Assessment/Plan: 1. Chronic systolic CHF: Ischemic cardiomyopathy.  Echo in  6/20 showed EF 25-30%. He has a Medtronic CRT-D device.  CPX in 11/20 showed moderate functional limitation due to CHF.  TEE in 6/21 with EF 25-30%, normal RV.  - NYHA II-III. Mildly fluid overloaded on exam and by  Optivol. Fluid index is starting to trend down but remains above threshold. Impedence below reference curve but trending up. No AF on device interrogation. BiV pacing ~99%. - Will cautiously add back Farxiga 10 mg daily. Wife will closely monitor BP and will alert [place] if more frequent or worsening orthostatic symptoms/ low BP - Continue lasix 80 mg AM/40 mg PM. - Stop Coreg and switch to Toprol XL (less alpha blockade) 12.5 mg qhs.   - Continue eplerenone 25 mg qhs - Continue losartan 12.5 mg qhs - BP did not tolerate Entresto.  Had ACEI cough.  - encouraged routine use of TED hoses and abdominal binder to reduce orthostatic hypotension to allow continuation of HF meds  2. CAD: s/p CABG and redo. Had DES to distal LM, angioplasty to ostial LCx in 4/17. Cardiolite 11/17 with scar, no ischemia.   - No chest pain.  - Continue statin.  - He is off ASA/Plavix and taking apixaban 5 mg bid.  - Continue ranolazine 500 mg bid.  - Stop Coreg per above, start Toprol XL 12.5 mg qhs  3. CKD stage IIIa: Check BMET. 4. Hyperlipidemia: Continue statin. Good lipids in 8/21. Repeat lipid panel today  5. Atrial fibrillation: Paroxysmal atrial fibrillation. Regular on exam. No significant A-fib on device interrogation.  - Continue apixaban.  No bleeding issues. 6. Orthostatic Hypotension - persists but no syncope/ near syncope  - encouraged routine use of TED hoses and abdominal binder to reduce orthostatic hypotension to allow continuation of HF meds  - Change  blocker from Coreg>> Toprol XL (less alpha blockade)  - Wife will closely monitor BP and will alert [place] if more frequent or worsening orthostatic symptoms/ low BP - He denies abnormal bleeding but on Eliquis. Last CBC was 12/21. Will repeat to  r/o anemia 7. DMII: on insulin 70/30.  Follow by PCP  - add back Farxiga, check Hgb A1c  F/u in 2 weeks   Prapti Grussing PA-C 06/29/2020

## 2020-06-29 NOTE — Patient Instructions (Signed)
START Toprol XL 12.5 mg, one tab daily START Farxiga 10 mg, one tab daily STOP Coreg  Labs today We will only contact you if something comes back abnormal or we need to make some changes. Otherwise no news is good news!  Your physician recommends that you schedule a follow-up appointment in: 1-4 weeks  in the Advanced Practitioners (PA/NP) Clinic    Do the following things EVERYDAY: 1) Weigh yourself in the morning before breakfast. Write it down and keep it in a log. 2) Take your medicines as prescribed 3) Eat low salt foods--Limit salt (sodium) to 2000 mg per day.  4) Stay as active as you can everyday 5) Limit all fluids for the day to less than 2 liters  At the [facility], you and your health needs are our priority. As part of our continuing mission to provide you with exceptional heart care, we have created designated Provider Care Teams. These Care Teams include your primary Cardiologist (physician) and Advanced Practice Providers (APPs- Physician Assistants and Nurse Practitioners) who all work together to provide you with the care you need, when you need it.   You may see any of the following providers on your designated Care Team at your next follow up: Marland Kitchen Dr Glori Bickers . Dr [name] . Dr Vickki Muff . Darrick Grinder, NP . Lyda Jester, [place] . Audry Riles, PharmD   Please be sure to bring in all your medications bottles to every appointment.    If you have any questions or concerns before your next appointment please send [place] a message through [place] or call our office at [phone].    TO LEAVE A MESSAGE FOR THE NURSE SELECT OPTION 2, PLEASE LEAVE A MESSAGE INCLUDING: . YOUR NAME . DATE OF BIRTH . CALL BACK NUMBER . REASON FOR CALL**this is important as we prioritize the call backs  YOU WILL RECEIVE A CALL BACK THE SAME DAY AS LONG AS YOU CALL BEFORE 4:00 PM  Please see our updated No Show and Same Day  Appointment Cancellation Policy attached to your AVS.

## 2020-07-02 NOTE — Progress Notes (Signed)
EPIC Encounter for ICM Monitoring  Patient Name: [name] is a 80 y.o. male Date: 07/02/2020 Primary Care Physican: Mayra Neer, MD Primary Cardiologist:[place] Electrophysiologist: Allred Bi-V Pacing: 94% 3/15/2022OfficeWeight: 179lbs  Since 01-Jun-2020 Time in AT/AF  <0.1 hr/day (<0.1%) Longest AT/AF 86 seconds  Pt seen in HF clinic office on 3/15.  OptiVolThoracicimpedancesuggesting fluid levels returned to baseline.  Prescribed:  Furosemide 40 mg 2 tablets (80 mg total) every AM and 1 tablet (40 mg total).  Labs: 06/29/2020 Creatinine 1.67, BUN 35, Potassium 4.3, Sodium 136, GFR 42, BNP 160.6 06/01/2020 Creatinine 1.55, BUN 24, Potassium 3.9, Sodium 139, GFR 46, BNP 128.4 04/27/2020 Creatinine 1.28, BUN 21, Potassium 5.1, Sodium 133, GFR 58 03/16/2020 Creatinine 1.62. BUN 22, Potassium 3.5, Sodium 135, GFR 43 12/03/2019 Creatinine 1.66, BUN 30, Potassium 4.4, Sodium 139, GFR 40-49 10/29/2019 Creatinine 1.75, BUN 32, Potassium 4.1, Sodium 133, GFR 37-43 A complete set of results can be found in Results Review.  Recommendations: Recommendations given at 06/29/2020 HF clinic office visit.  Follow-up plan: ICM clinic phone appointment on4/18/2022. 91 day device clinic remote transmission4/28/2022.   EP/Cardiology Office Visits:07/27/2020 with HF clinic PA/NP.Recall8/18/2022 with [name], PA.  Copy of ICM check sent to Dr.Allred.   3 month ICM trend: 06/29/2020.    1 Year ICM trend:       [name], RN 07/02/2020 9:08 AM

## 2020-07-23 ENCOUNTER — Emergency Department (HOSPITAL_COMMUNITY): Payer: Medicare HMO

## 2020-07-23 ENCOUNTER — Inpatient Hospital Stay (HOSPITAL_COMMUNITY)
Admission: EM | Admit: 2020-07-23 | Discharge: 2020-07-27 | DRG: 287 | Disposition: A | Discharge status: Home Health | Payer: Medicare HMO | Attending: Cardiology | Admitting: Cardiology

## 2020-07-23 DIAGNOSIS — Z9049 Acquired absence of other specified parts of digestive tract: Secondary | ICD-10-CM

## 2020-07-23 DIAGNOSIS — Z951 Presence of aortocoronary bypass graft: Secondary | ICD-10-CM | POA: Diagnosis not present

## 2020-07-23 DIAGNOSIS — Z888 Allergy status to other drugs, medicaments and biological substances status: Secondary | ICD-10-CM

## 2020-07-23 DIAGNOSIS — I447 Left bundle-branch block, unspecified: Secondary | ICD-10-CM | POA: Diagnosis present

## 2020-07-23 DIAGNOSIS — I2510 Atherosclerotic heart disease of native coronary artery without angina pectoris: Secondary | ICD-10-CM | POA: Diagnosis present

## 2020-07-23 DIAGNOSIS — R9431 Abnormal electrocardiogram [ECG] [EKG]: Secondary | ICD-10-CM | POA: Diagnosis not present

## 2020-07-23 DIAGNOSIS — I480 Paroxysmal atrial fibrillation: Secondary | ICD-10-CM | POA: Diagnosis present

## 2020-07-23 DIAGNOSIS — F32A Depression, unspecified: Secondary | ICD-10-CM | POA: Diagnosis present

## 2020-07-23 DIAGNOSIS — I517 Cardiomegaly: Secondary | ICD-10-CM | POA: Diagnosis not present

## 2020-07-23 DIAGNOSIS — I472 Ventricular tachycardia, unspecified: Secondary | ICD-10-CM

## 2020-07-23 DIAGNOSIS — Z9842 Cataract extraction status, left eye: Secondary | ICD-10-CM

## 2020-07-23 DIAGNOSIS — R918 Other nonspecific abnormal finding of lung field: Secondary | ICD-10-CM | POA: Diagnosis not present

## 2020-07-23 DIAGNOSIS — E1165 Type 2 diabetes mellitus with hyperglycemia: Secondary | ICD-10-CM | POA: Diagnosis not present

## 2020-07-23 DIAGNOSIS — M47814 Spondylosis without myelopathy or radiculopathy, thoracic region: Secondary | ICD-10-CM | POA: Diagnosis not present

## 2020-07-23 DIAGNOSIS — R4020 Unspecified coma: Secondary | ICD-10-CM | POA: Diagnosis not present

## 2020-07-23 DIAGNOSIS — Z95810 Presence of automatic (implantable) cardiac defibrillator: Secondary | ICD-10-CM

## 2020-07-23 DIAGNOSIS — E860 Dehydration: Secondary | ICD-10-CM | POA: Diagnosis present

## 2020-07-23 DIAGNOSIS — I951 Orthostatic hypotension: Secondary | ICD-10-CM | POA: Diagnosis present

## 2020-07-23 DIAGNOSIS — I4901 Ventricular fibrillation: Secondary | ICD-10-CM | POA: Diagnosis not present

## 2020-07-23 DIAGNOSIS — Z7901 Long term (current) use of anticoagulants: Secondary | ICD-10-CM

## 2020-07-23 DIAGNOSIS — N1831 Chronic kidney disease, stage 3a: Secondary | ICD-10-CM | POA: Diagnosis present

## 2020-07-23 DIAGNOSIS — Z4502 Encounter for adjustment and management of automatic implantable cardiac defibrillator: Secondary | ICD-10-CM

## 2020-07-23 DIAGNOSIS — Z20822 Contact with and (suspected) exposure to covid-19: Secondary | ICD-10-CM | POA: Diagnosis present

## 2020-07-23 DIAGNOSIS — Z8249 Family history of ischemic heart disease and other diseases of the circulatory system: Secondary | ICD-10-CM

## 2020-07-23 DIAGNOSIS — S069X9A Unspecified intracranial injury with loss of consciousness of unspecified duration, initial encounter: Secondary | ICD-10-CM | POA: Diagnosis not present

## 2020-07-23 DIAGNOSIS — Z8241 Family history of sudden cardiac death: Secondary | ICD-10-CM

## 2020-07-23 DIAGNOSIS — Z794 Long term (current) use of insulin: Secondary | ICD-10-CM

## 2020-07-23 DIAGNOSIS — R404 Transient alteration of awareness: Secondary | ICD-10-CM | POA: Diagnosis not present

## 2020-07-23 DIAGNOSIS — I255 Ischemic cardiomyopathy: Secondary | ICD-10-CM | POA: Diagnosis present

## 2020-07-23 DIAGNOSIS — Z885 Allergy status to narcotic agent status: Secondary | ICD-10-CM

## 2020-07-23 DIAGNOSIS — I130 Hypertensive heart and chronic kidney disease with heart failure and stage 1 through stage 4 chronic kidney disease, or unspecified chronic kidney disease: Secondary | ICD-10-CM | POA: Diagnosis present

## 2020-07-23 DIAGNOSIS — Z961 Presence of intraocular lens: Secondary | ICD-10-CM | POA: Diagnosis present

## 2020-07-23 DIAGNOSIS — I5022 Chronic systolic (congestive) heart failure: Secondary | ICD-10-CM | POA: Diagnosis present

## 2020-07-23 DIAGNOSIS — Z9841 Cataract extraction status, right eye: Secondary | ICD-10-CM

## 2020-07-23 DIAGNOSIS — E1122 Type 2 diabetes mellitus with diabetic chronic kidney disease: Secondary | ICD-10-CM | POA: Diagnosis present

## 2020-07-23 DIAGNOSIS — Z79899 Other long term (current) drug therapy: Secondary | ICD-10-CM

## 2020-07-23 DIAGNOSIS — R569 Unspecified convulsions: Secondary | ICD-10-CM | POA: Diagnosis not present

## 2020-07-23 DIAGNOSIS — Z955 Presence of coronary angioplasty implant and graft: Secondary | ICD-10-CM

## 2020-07-23 DIAGNOSIS — Z743 Need for continuous supervision: Secondary | ICD-10-CM | POA: Diagnosis not present

## 2020-07-23 DIAGNOSIS — I493 Ventricular premature depolarization: Secondary | ICD-10-CM | POA: Diagnosis present

## 2020-07-23 DIAGNOSIS — Z87891 Personal history of nicotine dependence: Secondary | ICD-10-CM

## 2020-07-23 DIAGNOSIS — Z809 Family history of malignant neoplasm, unspecified: Secondary | ICD-10-CM

## 2020-07-23 DIAGNOSIS — E785 Hyperlipidemia, unspecified: Secondary | ICD-10-CM | POA: Diagnosis present

## 2020-07-23 HISTORY — DX: Ventricular tachycardia: I47.2

## 2020-07-23 HISTORY — DX: Ventricular tachycardia, unspecified: I47.20

## 2020-07-23 LAB — CBC WITH DIFFERENTIAL/PLATELET
Abs Immature Granulocytes: 0.05 10*3/uL (ref 0.00–0.07)
Basophils Absolute: 0 10*3/uL (ref 0.0–0.1)
Basophils Relative: 1 %
Eosinophils Absolute: 0.1 10*3/uL (ref 0.0–0.5)
Eosinophils Relative: 1 %
HCT: 42.7 % (ref 39.0–52.0)
Hemoglobin: 13.7 g/dL (ref 13.0–17.0)
Immature Granulocytes: 1 %
Lymphocytes Relative: 14 %
Lymphs Abs: 1.2 10*3/uL (ref 0.7–4.0)
MCH: 31.5 pg (ref 26.0–34.0)
MCHC: 32.1 g/dL (ref 30.0–36.0)
MCV: 98.2 fL (ref 80.0–100.0)
Monocytes Absolute: 0.7 10*3/uL (ref 0.1–1.0)
Monocytes Relative: 9 %
Neutro Abs: 6.4 10*3/uL (ref 1.7–7.7)
Neutrophils Relative %: 74 %
Platelets: 258 10*3/uL (ref 150–400)
RBC: 4.35 MIL/uL (ref 4.22–5.81)
RDW: 14.9 % (ref 11.5–15.5)
WBC: 8.5 10*3/uL (ref 4.0–10.5)
nRBC: 0 % (ref 0.0–0.2)

## 2020-07-23 LAB — BASIC METABOLIC PANEL
Anion gap: 10 (ref 5–15)
BUN: 23 mg/dL (ref 8–23)
CO2: 26 mmol/L (ref 22–32)
Calcium: 8.4 mg/dL — ABNORMAL LOW (ref 8.9–10.3)
Chloride: 100 mmol/L (ref 98–111)
Creatinine, Ser: 1.65 mg/dL — ABNORMAL HIGH (ref 0.61–1.24)
GFR, Estimated: 43 mL/min — ABNORMAL LOW (ref >60)
Glucose, Bld: 209 mg/dL — ABNORMAL HIGH (ref 70–99)
Potassium: 3.7 mmol/L (ref 3.5–5.1)
Sodium: 136 mmol/L (ref 135–145)

## 2020-07-23 LAB — TROPONIN I (HIGH SENSITIVITY): Troponin I (High Sensitivity): 33 ng/L — ABNORMAL HIGH (ref <18)

## 2020-07-23 LAB — MAGNESIUM: Magnesium: 2.1 mg/dL (ref 1.7–2.4)

## 2020-07-23 MED ORDER — ACETAMINOPHEN 325 MG PO TABS
650.0000 mg | ORAL_TABLET | ORAL | Status: DC | PRN
  Administered 2020-07-25: 650 mg via ORAL
  Filled 2020-07-23: qty 2

## 2020-07-23 MED ORDER — ONDANSETRON HCL 4 MG/2ML IJ SOLN: 4.0000 mg | Freq: Four times a day (QID) | INTRAMUSCULAR | Status: DC | PRN

## 2020-07-23 MED ORDER — QUETIAPINE FUMARATE 100 MG PO TABS
100.0000 mg | ORAL_TABLET | Freq: Every day | ORAL | Status: DC
  Administered 2020-07-24 – 2020-07-26 (×4): 100 mg via ORAL
  Filled 2020-07-23 (×4): qty 1

## 2020-07-23 MED ORDER — LOSARTAN POTASSIUM 25 MG PO TABS
12.5000 mg | ORAL_TABLET | Freq: Every day | ORAL | Status: DC
  Administered 2020-07-25: 12.5 mg via ORAL
  Filled 2020-07-23 (×2): qty 1

## 2020-07-23 MED ORDER — ACETAMINOPHEN 325 MG PO TABS: 650.0000 mg | ORAL_TABLET | ORAL | Status: DC | PRN

## 2020-07-23 MED ORDER — INSULIN ASPART 100 UNIT/ML ~~LOC~~ SOLN
4.0000 [IU] | Freq: Three times a day (TID) | SUBCUTANEOUS | Status: DC
  Administered 2020-07-24 – 2020-07-27 (×3): 4 [IU] via SUBCUTANEOUS

## 2020-07-23 MED ORDER — VENLAFAXINE HCL ER 150 MG PO CP24
150.0000 mg | ORAL_CAPSULE | Freq: Every day | ORAL | Status: DC
  Administered 2020-07-25 – 2020-07-27 (×3): 150 mg via ORAL
  Filled 2020-07-23 (×4): qty 1

## 2020-07-23 MED ORDER — SODIUM CHLORIDE 0.9% FLUSH: 3.0000 mL | INTRAVENOUS | Status: DC | PRN

## 2020-07-23 MED ORDER — APIXABAN 5 MG PO TABS
5.0000 mg | ORAL_TABLET | Freq: Two times a day (BID) | ORAL | Status: DC
  Administered 2020-07-24: 5 mg via ORAL
  Filled 2020-07-23: qty 1

## 2020-07-23 MED ORDER — SODIUM CHLORIDE 0.9% FLUSH
3.0000 mL | Freq: Two times a day (BID) | INTRAVENOUS | Status: DC
  Administered 2020-07-24 – 2020-07-27 (×7): 3 mL via INTRAVENOUS

## 2020-07-23 MED ORDER — ATORVASTATIN CALCIUM 80 MG PO TABS
80.0000 mg | ORAL_TABLET | Freq: Every day | ORAL | Status: DC
  Administered 2020-07-24 – 2020-07-27 (×5): 80 mg via ORAL
  Filled 2020-07-23: qty 2
  Filled 2020-07-23 (×4): qty 1

## 2020-07-23 MED ORDER — ALLOPURINOL 100 MG PO TABS
200.0000 mg | ORAL_TABLET | Freq: Every day | ORAL | Status: DC
  Administered 2020-07-24 – 2020-07-27 (×4): 200 mg via ORAL
  Filled 2020-07-23 (×4): qty 2

## 2020-07-23 MED ORDER — DAPAGLIFLOZIN PROPANEDIOL 10 MG PO TABS
10.0000 mg | ORAL_TABLET | Freq: Every day | ORAL | Status: DC
  Administered 2020-07-25 – 2020-07-27 (×3): 10 mg via ORAL
  Filled 2020-07-23 (×4): qty 1

## 2020-07-23 MED ORDER — SODIUM CHLORIDE 0.9 % IV SOLN: 250.0000 mL | INTRAVENOUS | Status: DC | PRN

## 2020-07-23 MED ORDER — INSULIN ASPART 100 UNIT/ML ~~LOC~~ SOLN: 0.0000 [IU] | Freq: Every day | SUBCUTANEOUS | Status: DC

## 2020-07-23 MED ORDER — INSULIN ASPART 100 UNIT/ML ~~LOC~~ SOLN
0.0000 [IU] | Freq: Three times a day (TID) | SUBCUTANEOUS | Status: DC
  Administered 2020-07-24: 5 [IU] via SUBCUTANEOUS
  Administered 2020-07-24 – 2020-07-25 (×2): 2 [IU] via SUBCUTANEOUS
  Administered 2020-07-25 (×2): 3 [IU] via SUBCUTANEOUS
  Administered 2020-07-27: 5 [IU] via SUBCUTANEOUS

## 2020-07-23 MED ORDER — SPIRONOLACTONE 25 MG PO TABS
25.0000 mg | ORAL_TABLET | Freq: Every day | ORAL | Status: DC
  Administered 2020-07-24 – 2020-07-25 (×2): 25 mg via ORAL
  Filled 2020-07-23 (×2): qty 1

## 2020-07-23 MED ORDER — METOPROLOL SUCCINATE ER 25 MG PO TB24
12.5000 mg | ORAL_TABLET | Freq: Every day | ORAL | Status: DC
  Administered 2020-07-25: 12.5 mg via ORAL
  Filled 2020-07-23 (×4): qty 1

## 2020-07-23 MED ORDER — RANOLAZINE ER 500 MG PO TB12
500.0000 mg | ORAL_TABLET | Freq: Two times a day (BID) | ORAL | Status: DC
  Administered 2020-07-24 – 2020-07-27 (×8): 500 mg via ORAL
  Filled 2020-07-23 (×8): qty 1

## 2020-07-23 MED ORDER — FUROSEMIDE 40 MG PO TABS: 40.0000 mg | ORAL_TABLET | Freq: Two times a day (BID) | ORAL | Status: DC
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
## 2020-07-23 NOTE — ED Provider Notes (Signed)
[facility] EMERGENCY DEPARTMENT Provider Note   CSN: [id number] Arrival date & time: 07/23/20  2018     History Chief Complaint  Patient presents with  . Loss of Consciousness  . Pacemaker Problem    Defib firing      [name] is a 80 y.o. male with history of coronary disease, congestive heart failure with an EF of 30%, status post Medtronic pacemaker, A fib on eliquis, presenting to emergency department with episode of syncope.  Patient presents with his wife.  She reports that she was with him today at a restaurant.  He was eating cheesecake.  Suddenly his right hand began shaking, and the patient chin slumped forward.  He appeared to lose consciousness for about 7 to 8 seconds.  She said afterwards he appeared more awake, although drowsy and confused.  EMS reports there may have been a second defibrillation episode in route to the hospital.  The patient currently reports that he is asymptomatic and feels fine.  He reports feeling fatigued.  He denies chest pain or lightheadedness or shortness of breath.  He states his defibrillator is never gone off before.  He reports he was in his usual state of health this week.  He does have congestive heart failure.  Cardiologist Dr Marigene Ehlers and Dr Rayann Heman  HPI     Past Medical History:  Diagnosis Date  . CAD (coronary artery disease)    a. s/p CABG 1997 b. PCI (BMS) of SVG to RCA 3/09 c. redo CABG 02/2011 with SVG to PDA, SVG to Lcx d. Cath 07/2013: ischemic cardiomyopathy with LVEF less than 20%, occlusion of the SVG's placed in 2012 e.stenting of LM in 07/2015 with PCI of LCx performed as well  . CHF (congestive heart failure) ([place])   . Chronic systolic dysfunction of left ventricle    EF 30%  . Depression   . DJD (degenerative joint disease)   . DM (diabetes mellitus) ([place]) 02/14/2011  . GERD (gastroesophageal reflux disease)   . Gout   . HTN (hypertension) 02/14/2011  . Hyperlipemia   . Ischemic  cardiomyopathy    s/p ICD Implantation by Dr Leonia Reeves  . Paroxysmal atrial fibrillation ([place]) 10/22/2014   single episode of AF x 3 hours 48 minutes recorded on ICD, [id number] score is at least 5   . Renal disorder     Patient Active Problem List   Diagnosis Date Noted  . Ventricular fibrillation ([place]) 07/23/2020  . Syncope and collapse 09/24/2017  . GERD (gastroesophageal reflux disease) 09/24/2017  . Type 2 diabetes mellitus ([place]) 09/24/2017  . Chronic combined systolic and diastolic CHF (congestive heart failure) ([place]) 09/24/2017  . Acute renal failure superimposed on stage 3 chronic kidney disease ([place]) 09/24/2017  . Syncope   . Acute kidney injury ([place]) 05/01/2017  . Confusion 05/01/2017  . NSTEMI (non-ST elevated myocardial infarction) ([place])   . Unstable angina ([place])   . Chest pain with high risk of acute coronary syndrome 08/01/2015  . CKD (chronic kidney disease) stage 3, GFR 30-59 ml/min (HCC) 06/14/2015  . Angina pectoris ([place]) 02/17/2015  . Paroxysmal atrial fibrillation ([place]) 01/11/2015  . Hypersomnia 04/09/2013  . Chronic systolic CHF (congestive heart failure) ([place]) 12/12/2012  . 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 Chronic cholecystitis with calculus 01/02/2012  . Nausea vomiting and diarrhea 12/24/2011  . Dizziness 12/24/2011  . Preop cardiovascular exam 12/24/2011  . CAD (coronary artery disease) 02/14/2011  . HTN (hypertension) 02/14/2011  .  DM (diabetes mellitus) ([place]) 02/14/2011  . Ischemic cardiomyopathy 10/14/2010  . Hyperlipemia 10/14/2010    Past Surgical History:  Procedure Laterality Date  . BI-VENTRICULAR IMPLANTABLE CARDIOVERTER DEFIBRILLATOR UPGRADE N/A 08/24/2011   Procedure: BI-VENTRICULAR IMPLANTABLE CARDIOVERTER DEFIBRILLATOR UPGRADE;  Surgeon: Evans Lance, MD;  Location: [facility] CATH LAB;  Service: Cardiovascular;  Laterality: N/A;  . BIV ICD GENERATOR CHANGEOUT N/A 02/13/2018   Procedure: BIV ICD GENERATOR  CHANGEOUT;  Surgeon: Thompson Grayer, MD;  Location: [place] CV LAB;  Service: Cardiovascular;  Laterality: N/A;  . CARDIAC CATHETERIZATION  08/03/2015   Procedure: Left Heart Cath and Cors/Grafts Angiography;  Surgeon: Peter M Martinique, MD;  Location: [place] CV LAB;  Service: Cardiovascular;;  . CARDIAC CATHETERIZATION N/A 08/06/2015   Procedure: Coronary Stent Intervention w/Impella;  Surgeon: Jettie Booze, MD;  Location: [place] CV LAB;  Service: Cardiovascular;  Laterality: N/A;  . CARDIAC CATHETERIZATION  08/06/2015   Procedure: Left Heart Cath;  Surgeon: Jettie Booze, MD;  Location: [place] CV LAB;  Service: Cardiovascular;;  . CARDIAC CATHETERIZATION  08/06/2015   Procedure: Coronary Balloon Angioplasty;  Surgeon: Jettie Booze, MD;  Location: [place] CV LAB;  Service: Cardiovascular;;  . CARDIAC DEFIBRILLATOR PLACEMENT  08/2004   initial placement, upgraded to Moore ICD by Dr Lovena Le 08/24/11 (MDT)  . CARDIOVERSION N/A 09/26/2019   Procedure: CARDIOVERSION;  Surgeon: Larey Dresser, MD;  Location: [facility] ENDOSCOPY;  Service: Cardiovascular;  Laterality: N/A;  . CATARACT EXTRACTION W/ INTRAOCULAR LENS  IMPLANT, BILATERAL  2012  . CHOLECYSTECTOMY  01/05/2012   Procedure: LAPAROSCOPIC CHOLECYSTECTOMY WITH INTRAOPERATIVE CHOLANGIOGRAM;  Surgeon: Joyice Faster. Cornett, MD;  Location: [place];  Service: General;  Laterality: N/A;  laparoscopic cholecysectoym with intraoperative cholangiogram  . COLONOSCOPY WITH PROPOFOL N/A 11/18/2013   Procedure: COLONOSCOPY WITH PROPOFOL;  Surgeon: Garlan Fair, MD;  Location: [facility] ENDOSCOPY;  Service: Endoscopy;  Laterality: N/A;  . CORONARY ANGIOPLASTY WITH STENT PLACEMENT  06/2007   BMS to SVG to RCA  . CORONARY ARTERY BYPASS GRAFT  01/1996   CABG x 5 LIMA to LAD SVG to diag1,2,svg to om,svg to RCA  . CORONARY ARTERY BYPASS GRAFT  03/06/2011   CABG X2; Procedure: REDO CORONARY ARTERY BYPASS GRAFTING (CABG);  Surgeon: Grace Isaac,  MD;  Location: [place];  Service: Open Heart Surgery;  Laterality: N/A;  times two grafts using right saphenous vein harvested endoscopically.  Marland Kitchen KNEE ARTHROTOMY  ~ 1978   RIGHT KNEE CARTILAGE REMOVED  . LEFT HEART CATHETERIZATION WITH CORONARY ANGIOGRAM N/A 06/06/2011   Procedure: LEFT HEART CATHETERIZATION WITH CORONARY ANGIOGRAM;  Surgeon: Sueanne Margarita, MD;  Location: [place] CATH LAB;  Service: Cardiovascular;  Laterality: N/A;  . LEFT HEART CATHETERIZATION WITH CORONARY/GRAFT ANGIOGRAM N/A 08/08/2013   Procedure: LEFT HEART CATHETERIZATION WITH Beatrix Fetters;  Surgeon: Sinclair Grooms, MD;  Location: [facility] CATH LAB;  Service: Cardiovascular;  Laterality: N/A;  . LUMBAR [place] SURGERY  2003  . RIGHT HEART CATHETERIZATION N/A 02/17/2014   Procedure: RIGHT HEART CATH;  Surgeon: Larey Dresser, MD;  Location: [facility] CATH LAB;  Service: Cardiovascular;  Laterality: N/A;  . TEE WITHOUT CARDIOVERSION N/A 09/26/2019   Procedure: TRANSESOPHAGEAL ECHOCARDIOGRAM (TEE);  Surgeon: Larey Dresser, MD;  Location: [facility] ENDOSCOPY;  Service: Cardiovascular;  Laterality: N/A;  . VENOGRAM N/A 06/08/2011   Procedure: VENOGRAM;  Surgeon: Thompson Grayer, MD;  Location: [facility] CATH LAB;  Service: Cardiovascular;  Laterality: N/A;       Family History  Problem Relation Age of Onset  . Heart failure Mother   . Heart attack Mother   . Heart failure Father   . Heart attack Father   . Coronary artery disease Other   . Sudden Cardiac Death Brother        in his 84's  . Heart attack Brother   . Cancer Brother     Social History   Tobacco Use  . Smoking status: Former Smoker    Packs/day: 0.50    Years: 15.00    Pack years: 7.50    Types: Cigarettes    Quit date: 04/17/1969    Years since quitting: 51.3  . Smokeless tobacco: Former Systems developer    Quit date: 05/05/1971  Vaping Use  . Vaping Use: Never used  Substance Use Topics  . Alcohol use: Yes    Alcohol/week: 1.0 standard drink    Types: 1 Cans of beer per week     Comment: occasional  . Drug use: No    Home Medications Prior to Admission medications   Medication Sig Start Date End Date Taking? Authorizing Provider  acetaminophen (TYLENOL) 500 MG tablet Take 1,000 mg by mouth every 6 (six) hours as needed for moderate pain.   Yes [provider]  allopurinol (ZYLOPRIM) 100 MG tablet Take 200 mg by mouth daily.   Yes [provider]  atorvastatin (LIPITOR) 80 MG tablet TAKE 1 TABLET BY MOUTH ONCE DAILY AT  6PM  IN  THE  EVENING 08/13/19  Yes Larey Dresser, MD  Continuous Blood Gluc Sensor (FREESTYLE LIBRE 14 DAY SENSOR) MISC USE TO CHECK GLUCOSE THREE TIMES DAILY AS DIRECTED 11/06/18  Yes [provider]  dapagliflozin propanediol (FARXIGA) 10 MG TABS tablet Take 1 tablet (10 mg total) by mouth daily before breakfast. 06/29/20  Yes Rosita Fire, Brittainy M, PA-C  ELIQUIS 5 MG TABS tablet Take 1 tablet by mouth twice daily 02/20/20  Yes Allred, Jeneen Rinks, MD  eplerenone (INSPRA) 25 MG tablet Take 1 tablet (25 mg total) by mouth at bedtime. 04/27/20  Yes Clegg, Amy D, NP  furosemide (LASIX) 40 MG tablet Take 2 tablets (80 mg total) by mouth every morning AND 1 tablet (40 mg total) every evening. 05/26/20 08/24/20 Yes Larey Dresser, MD  Insulin Isophane & Regular Human (NOVOLIN 70/30 FLEXPEN) (70-30) 100 UNIT/ML PEN Inject 40 Units into the skin daily before lunch.    Yes [provider]  loperamide (IMODIUM A-D) 2 MG tablet Take 2 mg by mouth 4 (four) times daily as needed for diarrhea or loose stools.   Yes [provider]  losartan (COZAAR) 25 MG tablet TAKE 1/2 (ONE-HALF) TABLET BY MOUTH ONCE DAILY AT BEDTIME 04/13/20  Yes Larey Dresser, MD  metoprolol succinate (TOPROL XL) 25 MG 24 hr tablet Take 0.5 tablets (12.5 mg total) by mouth daily. 06/29/20 06/29/21 Yes Simmons, Brittainy M, PA-C  QUEtiapine (SEROQUEL) 100 MG tablet Take 100 mg by mouth at bedtime. Taking 0.5 tablet at bedtime   Yes [provider]   ranolazine (RANEXA) 500 MG 12 hr tablet Take 500 mg by mouth 2 (two) times daily.   Yes [provider]  triamcinolone cream (KENALOG) 0.1 % Apply 1 application topically 2 (two) times daily as needed (for dry/irritated skin.).   Yes [provider]  venlafaxine (EFFEXOR-XR) 150 MG 24 hr capsule Take 150 mg by mouth daily with breakfast.   Yes [provider]  vitamin B-12 (CYANOCOBALAMIN) 1000 MCG tablet Take 1,000 mcg  by mouth daily.   Yes [provider]  Homeopathic Products [facility] ALLERGY EYE RELIEF OP) Place 1 drop into both eyes daily as needed (irritation).    [provider]  nitroGLYCERIN (NITROSTAT) 0.4 MG SL tablet Place 1 tablet (0.4 mg total) under the tongue every 5 (five) minutes as needed for chest pain. DO NOT EXCEED A TOTAL OF 3 DOSES IN 15 MINUTES 10/01/19   Larey Dresser, MD    Allergies    Codeine, Metformin hcl, Other, Trazodone, and Adhesive [tape]  Review of Systems   Review of Systems  Constitutional: Positive for fatigue. Negative for chills and fever.  HENT: Negative for ear pain and sore throat.   Eyes: Negative for pain and visual disturbance.  Respiratory: Negative for cough and shortness of breath.   Cardiovascular: Negative for chest pain and palpitations.  Gastrointestinal: Negative for abdominal pain and vomiting.  Genitourinary: Negative for dysuria and hematuria.  Musculoskeletal: Negative for arthralgias and back pain.  Skin: Negative for color change and rash.  Neurological: Positive for syncope and light-headedness.  All other systems reviewed and are negative.   Physical Exam Updated Vital Signs BP 123/66   Pulse 84   Temp 98.4 F (36.9 C) (Oral)   Resp 19   Ht 5\' 9"  (1.753 m)   Wt 80.3 kg   SpO2 96%   BMI 26.14 kg/m   Physical Exam Constitutional:      General: He is not in acute distress. HENT:     Head: Normocephalic and atraumatic.  Eyes:     Conjunctiva/sclera: Conjunctivae  normal.     Pupils: Pupils are equal, round, and reactive to light.  Cardiovascular:     Rate and Rhythm: Normal rate and regular rhythm.     Comments: HR 60, paced on telemetry Pulmonary:     Effort: Pulmonary effort is normal. No respiratory distress.  Abdominal:     General: There is no distension.     Tenderness: There is no abdominal tenderness.  Skin:    General: Skin is warm and dry.  Neurological:     General: No focal deficit present.     Mental Status: He is alert and oriented to person, place, and time. Mental status is at baseline.  Psychiatric:        Mood and Affect: Mood normal.        Behavior: Behavior normal.     ED Results / Procedures / Treatments   Labs (all labs ordered are listed, but only abnormal results are displayed) Labs Reviewed  BASIC METABOLIC PANEL - Abnormal; Notable for the following components:      Result Value   Glucose, Bld 209 (*)    Creatinine, Ser 1.65 (*)    Calcium 8.4 (*)    GFR, Estimated 43 (*)    All other components within normal limits  TROPONIN I (HIGH SENSITIVITY) - Abnormal; Notable for the following components:   Troponin I (High Sensitivity) 33 (*)    All other components within normal limits  TROPONIN I (HIGH SENSITIVITY) - Abnormal; Notable for the following components:   Troponin I (High Sensitivity) 102 (*)    All other components within normal limits  RESP PANEL BY RT-PCR (FLU A&B, COVID) ARPGX2  CBC WITH DIFFERENTIAL/PLATELET  MAGNESIUM  BASIC METABOLIC PANEL  BRAIN NATRIURETIC PEPTIDE  TSH    EKG EKG Interpretation  Date/Time:  Friday July 23 2020 20:29:31 EDT Ventricular Rate:  89 PR Interval:  182 QRS Duration: 145  QT Interval:  411 QTC Calculation: 501 R Axis:   246 Text Interpretation: V paced rhythm Right bundle branch block Anterolateral infarct, old No sig change from Jab 11 2022 ecg Confirmed by Octaviano Glow [phone]) on 07/23/2020 9:38:18 PM   Radiology DG Chest Port 1 View  Result  Date: 07/24/2020 CLINICAL DATA:  Defer burr later discharge LOC EXAM: PORTABLE CHEST 1 VIEW COMPARISON:  07/23/2020, 10/29/2019 FINDINGS: Post sternotomy changes. Left-sided pacing device with multiple intracardiac leads. Mildly diminished lung volumes. Stable cardiomediastinal silhouette. No acute airspace disease or pneumothorax. IMPRESSION: Low lung volumes. No active disease. Electronically Signed   By: Donavan Foil M.D.   On: 07/24/2020 00:40   DG Chest Portable 1 View  Result Date: 07/23/2020 CLINICAL DATA:  Infection evaluation. EXAM: PORTABLE CHEST 1 VIEW COMPARISON:  October 29, 2019 FINDINGS: Multiple sternal wires and vascular clips are seen. A multi lead AICD is noted. There are decreased lung volumes which is likely secondary to the degree of patient inspiration. There is no evidence of acute infiltrate, pleural effusion or pneumothorax. The cardiac silhouette is mildly enlarged. Degenerative changes seen throughout the thoracic spine. IMPRESSION: 1. Evidence of prior median sternotomy/CABG. 2. No acute or active cardiopulmonary disease. Electronically Signed   By: Virgina Norfolk M.D.   On: 07/23/2020 20:53    Procedures Procedures   Medications Ordered in ED Medications  allopurinol (ZYLOPRIM) tablet 200 mg (has no administration in time range)  atorvastatin (LIPITOR) tablet 80 mg (has no administration in time range)  spironolactone (ALDACTONE) tablet 25 mg (has no administration in time range)  furosemide (LASIX) tablet 40 mg (has no administration in time range)  losartan (COZAAR) tablet 12.5 mg (has no administration in time range)  metoprolol succinate (TOPROL-XL) 24 hr tablet 12.5 mg (has no administration in time range)  ranolazine (RANEXA) 12 hr tablet 500 mg (has no administration in time range)  QUEtiapine (SEROQUEL) tablet 100 mg (has no administration in time range)  venlafaxine XR (EFFEXOR-XR) 24 hr capsule 150 mg (has no administration in time range)  dapagliflozin  propanediol (FARXIGA) tablet 10 mg (has no administration in time range)  apixaban (ELIQUIS) tablet 5 mg (has no administration in time range)  sodium chloride flush (NS) 0.9 % injection 3 mL (has no administration in time range)  sodium chloride flush (NS) 0.9 % injection 3 mL (has no administration in time range)  0.9 %  sodium chloride infusion (has no administration in time range)  acetaminophen (TYLENOL) tablet 650 mg (has no administration in time range)  ondansetron (ZOFRAN) injection 4 mg (has no administration in time range)  insulin aspart (novoLOG) injection 0-15 Units (has no administration in time range)  insulin aspart (novoLOG) injection 0-5 Units (has no administration in time range)  insulin aspart (novoLOG) injection 4 Units (has no administration in time range)    ED Course  I have reviewed the triage vital signs and the nursing notes.  Pertinent labs & imaging results that were available during my care of the patient were reviewed by me and considered in my medical decision making (see chart for details).  80 yo male here with syncope while eating, reportedly had defibrillation via pacemaker while in company of EMS.  He is asymptomatic in the ED aside from some fatigue.  No active chest pain.  Medtronic interrogated with V Fib episodes and shocks reported this evening.  Less likely seizure - no prior episodes.  Less likely orthostattic hypotension as it occurred while sitting.  Less  likely sepsis or infection per clinical exam.  Trop 33 -> 102, consistent with shock.  Less likely acute MI. ECG personally reviewed showing chronic V paced rhythm.  Minimal PVC's on telmetry. BMP and CBC within recent baseline.  K 3.7.  Mg 2.1. DG chest reviewed - no acute disease process noted. Additional hx obtained from patient's wife  Clinical Course as of 07/24/20 0050  Fri Jul 23, 2020  2142 Nurse interrogated medtronic device and we are awaiting fax report.  Patient remains  asymptomatic. [MT]  2217 Report reviewed, multiple V Fib episodes today x3 with 2 shocks administered. Cards paged. [MT]  2219 I spoke to cardiology fellow who will come evaluate pt. [MT]  2335 Pt signed out to cardiology [MT]    Clinical Course User Index [MT] Webster Patrone, Carola Rhine, MD    Final Clinical Impression(s) / ED Diagnoses Final diagnoses:  Defibrillator discharge    Rx / DC Orders ED Discharge Orders    None       Wyvonnia Dusky, MD 07/24/20 [phone]

## 2020-07-24 ENCOUNTER — Encounter (HOSPITAL_COMMUNITY): Payer: Self-pay | Admitting: Student

## 2020-07-24 ENCOUNTER — Observation Stay (HOSPITAL_COMMUNITY): Payer: Medicare HMO

## 2020-07-24 DIAGNOSIS — I951 Orthostatic hypotension: Secondary | ICD-10-CM | POA: Diagnosis not present

## 2020-07-24 DIAGNOSIS — I130 Hypertensive heart and chronic kidney disease with heart failure and stage 1 through stage 4 chronic kidney disease, or unspecified chronic kidney disease: Secondary | ICD-10-CM | POA: Diagnosis not present

## 2020-07-24 DIAGNOSIS — Z9842 Cataract extraction status, left eye: Secondary | ICD-10-CM | POA: Diagnosis not present

## 2020-07-24 DIAGNOSIS — Z809 Family history of malignant neoplasm, unspecified: Secondary | ICD-10-CM | POA: Diagnosis not present

## 2020-07-24 DIAGNOSIS — N1831 Chronic kidney disease, stage 3a: Secondary | ICD-10-CM | POA: Diagnosis not present

## 2020-07-24 DIAGNOSIS — I255 Ischemic cardiomyopathy: Secondary | ICD-10-CM | POA: Diagnosis not present

## 2020-07-24 DIAGNOSIS — I519 Heart disease, unspecified: Secondary | ICD-10-CM | POA: Diagnosis not present

## 2020-07-24 DIAGNOSIS — I480 Paroxysmal atrial fibrillation: Secondary | ICD-10-CM | POA: Diagnosis not present

## 2020-07-24 DIAGNOSIS — I472 Ventricular tachycardia: Principal | ICD-10-CM

## 2020-07-24 DIAGNOSIS — Z7901 Long term (current) use of anticoagulants: Secondary | ICD-10-CM | POA: Diagnosis not present

## 2020-07-24 DIAGNOSIS — Z885 Allergy status to narcotic agent status: Secondary | ICD-10-CM | POA: Diagnosis not present

## 2020-07-24 DIAGNOSIS — J984 Other disorders of lung: Secondary | ICD-10-CM | POA: Diagnosis not present

## 2020-07-24 DIAGNOSIS — Z79899 Other long term (current) drug therapy: Secondary | ICD-10-CM | POA: Diagnosis not present

## 2020-07-24 DIAGNOSIS — Z87891 Personal history of nicotine dependence: Secondary | ICD-10-CM | POA: Diagnosis not present

## 2020-07-24 DIAGNOSIS — I5042 Chronic combined systolic (congestive) and diastolic (congestive) heart failure: Secondary | ICD-10-CM | POA: Diagnosis not present

## 2020-07-24 DIAGNOSIS — I2510 Atherosclerotic heart disease of native coronary artery without angina pectoris: Secondary | ICD-10-CM | POA: Diagnosis not present

## 2020-07-24 DIAGNOSIS — Z794 Long term (current) use of insulin: Secondary | ICD-10-CM | POA: Diagnosis not present

## 2020-07-24 DIAGNOSIS — Z951 Presence of aortocoronary bypass graft: Secondary | ICD-10-CM | POA: Diagnosis not present

## 2020-07-24 DIAGNOSIS — Z8249 Family history of ischemic heart disease and other diseases of the circulatory system: Secondary | ICD-10-CM | POA: Diagnosis not present

## 2020-07-24 DIAGNOSIS — E119 Type 2 diabetes mellitus without complications: Secondary | ICD-10-CM | POA: Diagnosis not present

## 2020-07-24 DIAGNOSIS — E785 Hyperlipidemia, unspecified: Secondary | ICD-10-CM | POA: Diagnosis not present

## 2020-07-24 DIAGNOSIS — Z9049 Acquired absence of other specified parts of digestive tract: Secondary | ICD-10-CM | POA: Diagnosis not present

## 2020-07-24 DIAGNOSIS — E1122 Type 2 diabetes mellitus with diabetic chronic kidney disease: Secondary | ICD-10-CM | POA: Diagnosis present

## 2020-07-24 DIAGNOSIS — Z9841 Cataract extraction status, right eye: Secondary | ICD-10-CM | POA: Diagnosis not present

## 2020-07-24 DIAGNOSIS — Z20822 Contact with and (suspected) exposure to covid-19: Secondary | ICD-10-CM | POA: Diagnosis not present

## 2020-07-24 DIAGNOSIS — Z888 Allergy status to other drugs, medicaments and biological substances status: Secondary | ICD-10-CM | POA: Diagnosis not present

## 2020-07-24 DIAGNOSIS — Z8241 Family history of sudden cardiac death: Secondary | ICD-10-CM | POA: Diagnosis not present

## 2020-07-24 DIAGNOSIS — I5022 Chronic systolic (congestive) heart failure: Secondary | ICD-10-CM | POA: Diagnosis not present

## 2020-07-24 DIAGNOSIS — Z961 Presence of intraocular lens: Secondary | ICD-10-CM | POA: Diagnosis present

## 2020-07-24 DIAGNOSIS — I4901 Ventricular fibrillation: Secondary | ICD-10-CM | POA: Diagnosis not present

## 2020-07-24 LAB — BASIC METABOLIC PANEL
Anion gap: 9 (ref 5–15)
BUN: 26 mg/dL — ABNORMAL HIGH (ref 8–23)
CO2: 24 mmol/L (ref 22–32)
Calcium: 8.3 mg/dL — ABNORMAL LOW (ref 8.9–10.3)
Chloride: 102 mmol/L (ref 98–111)
Creatinine, Ser: 1.53 mg/dL — ABNORMAL HIGH (ref 0.61–1.24)
GFR, Estimated: 47 mL/min — ABNORMAL LOW (ref >60)
Glucose, Bld: 118 mg/dL — ABNORMAL HIGH (ref 70–99)
Potassium: 3.1 mmol/L — ABNORMAL LOW (ref 3.5–5.1)
Sodium: 135 mmol/L (ref 135–145)

## 2020-07-24 LAB — BRAIN NATRIURETIC PEPTIDE: B Natriuretic Peptide: 163.7 pg/mL — ABNORMAL HIGH (ref 0.0–100.0)

## 2020-07-24 LAB — TSH: TSH: 1.028 u[IU]/mL (ref 0.350–4.500)

## 2020-07-24 LAB — CBG MONITORING, ED: Glucose-Capillary: 146 mg/dL — ABNORMAL HIGH (ref 70–99)

## 2020-07-24 LAB — GLUCOSE, CAPILLARY
Glucose-Capillary: 105 mg/dL — ABNORMAL HIGH (ref 70–99)
Glucose-Capillary: 139 mg/dL — ABNORMAL HIGH (ref 70–99)
Glucose-Capillary: 203 mg/dL — ABNORMAL HIGH (ref 70–99)
Glucose-Capillary: 115 mg/dL — ABNORMAL HIGH (ref 70–99)

## 2020-07-24 LAB — RESP PANEL BY RT-PCR (FLU A&B, COVID) ARPGX2
Influenza A by PCR: NEGATIVE
Influenza B by PCR: NEGATIVE
SARS Coronavirus 2 by RT PCR: NEGATIVE

## 2020-07-24 LAB — TROPONIN I (HIGH SENSITIVITY): Troponin I (High Sensitivity): 102 ng/L (ref <18)

## 2020-07-24 LAB — MRSA PCR SCREENING: MRSA by PCR: NEGATIVE

## 2020-07-24 MED ORDER — AMIODARONE HCL 200 MG PO TABS
200.0000 mg | ORAL_TABLET | Freq: Two times a day (BID) | ORAL | Status: DC
  Administered 2020-07-24 – 2020-07-27 (×6): 200 mg via ORAL
  Filled 2020-07-24 (×7): qty 1

## 2020-07-24 NOTE — Consult Note (Signed)
ELECTROPHYSIOLOGY CONSULT NOTE    Primary Care Physician: Mayra Neer, MD Referring Physician:  Dr McLean/  Dr Langston Masker  Admit Date: 07/23/2020  Reason for consultation:  ICD shock  [name] is a 80 y.o. male with a h/o ischemic CM/ CAD and HTN who is well known to EP and the CHF team.  He is admitted with ICD shocks.   He has had substantial decline over the past 6 months.  He has fatigue and poor energy.  He has frequent hypotension and weakness.  He was seen last in the CHF clinic in March.  Medical therapy has been limited by hypotension.  The patient had syncope yesterday while in a restaurant.   ICD interrogation reveals that he had appropriate ICD therapy for VT.  Today, he denies symptoms of palpitations, chest pain, shortness of breath, or neurologic sequela. The patient is tolerating medications without difficulties and is otherwise without complaint today.   Past Medical History:  Diagnosis Date  . CAD (coronary artery disease)    a. s/p CABG 1997 b. PCI (BMS) of SVG to RCA 3/09 c. redo CABG 02/2011 with SVG to PDA, SVG to Lcx d. Cath 07/2013: ischemic cardiomyopathy with LVEF less than 20%, occlusion of the SVG's placed in 2012 e.stenting of LM in 07/2015 with PCI of LCx performed as well  . CHF (congestive heart failure) ([place])   . Chronic systolic dysfunction of left ventricle    EF 30%  . Depression   . DJD (degenerative joint disease)   . DM (diabetes mellitus) ([place]) 02/14/2011  . GERD (gastroesophageal reflux disease)   . Gout   . HTN (hypertension) 02/14/2011  . Hyperlipemia   . Ischemic cardiomyopathy    s/p ICD Implantation by Dr Leonia Reeves  . Paroxysmal atrial fibrillation ([place]) 10/22/2014   single episode of AF x 3 hours 48 minutes recorded on ICD, chads2vasc score is at least 5   . Renal disorder    Past Surgical History:  Procedure Laterality Date  . BI-VENTRICULAR IMPLANTABLE CARDIOVERTER DEFIBRILLATOR UPGRADE N/A 08/24/2011   Procedure: BI-VENTRICULAR  IMPLANTABLE CARDIOVERTER DEFIBRILLATOR UPGRADE;  Surgeon: Evans Lance, MD;  Location: [facility] CATH LAB;  Service: Cardiovascular;  Laterality: N/A;  . BIV ICD GENERATOR CHANGEOUT N/A 02/13/2018   Procedure: BIV ICD GENERATOR CHANGEOUT;  Surgeon: Thompson Grayer, MD;  Location: [place] CV LAB;  Service: Cardiovascular;  Laterality: N/A;  . CARDIAC CATHETERIZATION  08/03/2015   Procedure: Left Heart Cath and Cors/Grafts Angiography;  Surgeon: Peter M Martinique, MD;  Location: [place] CV LAB;  Service: Cardiovascular;;  . CARDIAC CATHETERIZATION N/A 08/06/2015   Procedure: Coronary Stent Intervention w/Impella;  Surgeon: Jettie Booze, MD;  Location: [place] CV LAB;  Service: Cardiovascular;  Laterality: N/A;  . CARDIAC CATHETERIZATION  08/06/2015   Procedure: Left Heart Cath;  Surgeon: Jettie Booze, MD;  Location: [place] CV LAB;  Service: Cardiovascular;;  . CARDIAC CATHETERIZATION  08/06/2015   Procedure: Coronary Balloon Angioplasty;  Surgeon: Jettie Booze, MD;  Location: [place] CV LAB;  Service: Cardiovascular;;  . CARDIAC DEFIBRILLATOR PLACEMENT  08/2004   initial placement, upgraded to [place] ICD by Dr Lovena Le 08/24/11 (MDT)  . CARDIOVERSION N/A 09/26/2019   Procedure: CARDIOVERSION;  Surgeon: Larey Dresser, MD;  Location: [facility] ENDOSCOPY;  Service: Cardiovascular;  Laterality: N/A;  . CATARACT EXTRACTION W/ INTRAOCULAR LENS  IMPLANT, BILATERAL  2012  . CHOLECYSTECTOMY  01/05/2012   Procedure: LAPAROSCOPIC CHOLECYSTECTOMY WITH INTRAOPERATIVE CHOLANGIOGRAM;  Surgeon: Joyice Faster.  Cornett, MD;  Location: [place];  Service: General;  Laterality: N/A;  laparoscopic cholecysectoym with intraoperative cholangiogram  . COLONOSCOPY WITH PROPOFOL N/A 11/18/2013   Procedure: COLONOSCOPY WITH PROPOFOL;  Surgeon: Garlan Fair, MD;  Location: [facility] ENDOSCOPY;  Service: Endoscopy;  Laterality: N/A;  . CORONARY ANGIOPLASTY WITH STENT PLACEMENT  06/2007   BMS to SVG to RCA  . CORONARY ARTERY  BYPASS GRAFT  01/1996   CABG x 5 LIMA to LAD SVG to diag1,2,svg to om,svg to RCA  . CORONARY ARTERY BYPASS GRAFT  03/06/2011   CABG X2; Procedure: REDO CORONARY ARTERY BYPASS GRAFTING (CABG);  Surgeon: Grace Isaac, MD;  Location: [place];  Service: Open Heart Surgery;  Laterality: N/A;  times two grafts using right saphenous vein harvested endoscopically.  Marland Kitchen KNEE ARTHROTOMY  ~ 1978   RIGHT KNEE CARTILAGE REMOVED  . LEFT HEART CATHETERIZATION WITH CORONARY ANGIOGRAM N/A 06/06/2011   Procedure: LEFT HEART CATHETERIZATION WITH CORONARY ANGIOGRAM;  Surgeon: Sueanne Margarita, MD;  Location: [place] CATH LAB;  Service: Cardiovascular;  Laterality: N/A;  . LEFT HEART CATHETERIZATION WITH CORONARY/GRAFT ANGIOGRAM N/A 08/08/2013   Procedure: LEFT HEART CATHETERIZATION WITH Beatrix Fetters;  Surgeon: Sinclair Grooms, MD;  Location: [facility] CATH LAB;  Service: Cardiovascular;  Laterality: N/A;  . LUMBAR [place] SURGERY  2003  . RIGHT HEART CATHETERIZATION N/A 02/17/2014   Procedure: RIGHT HEART CATH;  Surgeon: Larey Dresser, MD;  Location: [facility] CATH LAB;  Service: Cardiovascular;  Laterality: N/A;  . TEE WITHOUT CARDIOVERSION N/A 09/26/2019   Procedure: TRANSESOPHAGEAL ECHOCARDIOGRAM (TEE);  Surgeon: Larey Dresser, MD;  Location: [facility] ENDOSCOPY;  Service: Cardiovascular;  Laterality: N/A;  . VENOGRAM N/A 06/08/2011   Procedure: VENOGRAM;  Surgeon: Thompson Grayer, MD;  Location[facility] CATH LAB;  Service: Cardiovascular;  Laterality: N/A;    . allopurinol  200 mg Oral Daily  . apixaban  5 mg Oral BID  . atorvastatin  80 mg Oral Daily  . dapagliflozin propanediol  10 mg Oral QAC breakfast  . furosemide  40 mg Oral BID  . insulin aspart  0-15 Units Subcutaneous TID WC  . insulin aspart  0-5 Units Subcutaneous QHS  . insulin aspart  4 Units Subcutaneous TID WC  . losartan  12.5 mg Oral Daily  . metoprolol succinate  12.5 mg Oral Daily  . QUEtiapine  100 mg Oral QHS  . ranolazine  500 mg Oral BID  . sodium  chloride flush  3 mL Intravenous Q12H  . spironolactone  25 mg Oral Daily  . venlafaxine XR  150 mg Oral Q breakfast   . sodium chloride      Allergies  Allergen Reactions  . Codeine Nausea And Vomiting  . Metformin Hcl Other (See Comments)  . Other Other (See Comments)  . Trazodone Other (See Comments)  . Adhesive [Tape] Rash    Social History   Socioeconomic History  . Marital status: Married    Spouse name: Not on file  . Number of children: Not on file  . Years of education: Not on file  . Highest education level: Not on file  Occupational History  . Not on file  Tobacco Use  . Smoking status: Former Smoker    Packs/day: 0.50    Years: 15.00    Pack years: 7.50    Types: Cigarettes    Quit date: 04/17/1969    Years since quitting: 51.3  . Smokeless tobacco: Former Systems developer    Quit date: 05/05/1971  Vaping Use  . Vaping Use: Never used  Substance and Sexual Activity  . Alcohol use: Yes    Alcohol/week: 1.0 standard drink    Types: 1 Cans of beer per week    Comment: occasional  . Drug use: No  . Sexual activity: Never  Other Topics Concern  . Not on file  Social History Narrative   Lives in [place], retired Theatre manager for [employer].  He collects antique metal toys   Social Determinants of Health   Financial Resource Strain: Not on file  Food Insecurity: Not on file  Transportation Needs: Not on file  Physical Activity: Not on file  Stress: Not on file  Social Connections: Not on file  Intimate Partner Violence: Not on file    Family History  Problem Relation Age of Onset  . Heart failure Mother   . Heart attack Mother   . Heart failure Father   . Heart attack Father   . Coronary artery disease Other   . Sudden Cardiac Death Brother        in his 59's  . Heart attack Brother   . Cancer Brother     ROS- All systems are reviewed and negative except as per the HPI above  Physical Exam: Telemetry:  Sinus rhythm with BiV  pacing Vitals:   07/24/20 0130 07/24/20 0200 07/24/20 0300 07/24/20 0700  BP: 119/68 130/69 (!) 101/59 (!) 103/58  Pulse: 85 84 77 73  Resp: 19 20 13 13   Temp: 98.1 F (36.7 C)  97.6 F (36.4 C) 97.8 F (36.6 C)  TempSrc: Oral   Oral  SpO2: 94% 93% 92% 95%  Weight:  79.1 kg    Height:  5\' 9"  (1.753 m)      GEN- The patient is chronically ill appearing, alert and oriented x 3 today.   Head- normocephalic, atraumatic Eyes-  Sclera clear, conjunctiva pink Ears- hearing intact Oropharynx- clear Neck- supple, no JVP Lungs- Clear to ausculation bilaterally, normal work of breathing Heart- Regular rate and rhythm, no murmurs, rubs or gallops, PMI not laterally displaced GI- soft, NT, ND, + BS Extremities- no clubbing, cyanosis, or edema MS- no significant deformity or atrophy Skin- no rash or lesion Psych- euthymic mood, full affect Neuro- strength and sensation are intact  EKG-  Sinus with BiV pacing  Labs:   Lab Results  Component Value Date   WBC 8.5 07/23/2020   HGB 13.7 07/23/2020   HCT 42.7 07/23/2020   MCV 98.2 07/23/2020   PLT 258 07/23/2020    Recent Labs  Lab 07/24/20 0414  NA 135  K 3.1*  CL 102  CO2 24  BUN 26*  CREATININE 1.53*  CALCIUM 8.3*  GLUCOSE 118*   Lab Results  Component Value Date   CKTOTAL 95 12/25/2011   CKMB 2.3 12/25/2011   TROPONINI <0.03 01/06/2018    Lab Results  Component Value Date   CHOL 102 06/29/2020   CHOL 104 09/18/2019   CHOL 122 09/24/2018   Lab Results  Component Value Date   HDL 27 (L) 06/29/2020   HDL 36 (L) 09/18/2019   HDL 31 (L) 09/24/2018   Lab Results  Component Value Date   LDLCALC 45 06/29/2020   LDLCALC 51 09/18/2019   [place] 58 09/24/2018   ICD interrogation- personally reviewed.  Normal MDT BiV ICD function.  Appropriate therapy delivered for monomorphic VT.  optivol suggests that he may be dry  ASSESSMENT AND PLAN:   1. VT The  patient is admitted with appropriate ICD therapy for VT and  syncope.  He has had several episodes.  I suspect that his VT is due to his overall general decline.  I would not advise ablation at this time.  I have spoken with Dr Aundra Dubin.  We agree that further evaluation of his overall CHF status is appropriate.    I discussed options for treatment of VT at length with patient and his wife this am.  We discussed risks and benefits to Amiodarone including but not limited to occular, liver, thyroid, and lung toxicty at length today.  They wish to proceed. Start amiodarone 200mg  BID.  Transition to amiodarone 200mg  daily at discharge.  Proceed with cath to evaluate coronary status on Monday.  2. Chronic systolic dysfunction/ ischemic CM/ CAD As above optivol shows that he was quite wet in September but is now very dry. Hold lasix RHC/ LHC on Monday. Dr Aundra Dubin and I have spoken at length about his worrisome trajectory over the past year.  Would consider BAT therapy as an option vs more advanced therapies bending results of RHC/LHC on Monday. Hold lasix for now  3. Paroxysmal atrial fibrillation Well controlled by device interrogation Hold eliquis prior to cath.  4. Hypotension/ postural hypotension Dry currently Hold lasix Chronically hypotensive due to poor cardiac function  5. Stage IIIB CRI Complicates medical therapy   Thompson Grayer, MD 07/24/2020  8:25 AM

## 2020-07-24 NOTE — H&P (Signed)
Cardiology Admission History and Physical:   Patient ID: [name] MRN: [id number]; DOB: [birth year]   Admission date: 07/23/2020  PCP:  Mayra Neer, [place]  Cardiologist:  [name], MD Advanced Practice Provider:  Lyda Jester Electrophysiologist:  Thompson Grayer, MD  Chief Complaint:  ICD Discharge  Patient Profile:   [name] is a 80 y.o. male with ischemic cardiomyopathy s/p MDT CRT-D with HFrEF, 25-30%, two prior CABG most recently 2012 and subsequent LM PCI/LCx in 2017, CKD3b/4, pAF on apixaban who presents after his ICD discharged today.   History of Present Illness:   [name] is a 80 y.o. male with ischemic cardiomyopathy s/p MDT CRT-D with HFrEF, 25-30%, two prior CABG most recently 2012 and subsequent LM PCI/LCx in 2017, CKD3b/4, pAF on apixaban who presents after his ICD discharged today.   He reports that he's felt weak for the last week or so but no significant worsening of shortness of breath, orthopnea, or PND.  His swelling has improved with recent diuretic increase.  No chest pain, fevers, chills, nausea, vomiting, cough, sick contacts.  He has been strictly adherent to medications from what I can surmise.  He had been seen last in mid March in heart failure clinic at which time his furosemide was continued at a recently increased dose of 80/40 mg BID, carvedilol was stopped, and low dose metoprolol was started given issues with blood pressure and orthostasis.  His edema and shortness of breath improved in the interim.  For the last week he has been feeling fatigued with low energy and poor appetite with minimal activity.  Additionally, he is only tolerating low dose ARB.  I interrogated the device in the ED.  He had 4 episodes in VF zone, one in late March, another at midnight yesterday which were terminated with ATP.  He had two treated events with shocks this evening.  EGM reviewed with Cycle length ~ 280 ms,  monomorphic, and stable with atrial discordance.  Sensing, thresholds, and impedances are stable.  Remains programmed single VT zone 171-->VF 200.  His optivol has gone way down since late March, activity has been very low at <1 hr/day, and he has been having brief paroxysms of AF since late march as well.  BiV pacing 96%.  No programming changes made and confirmed prior to session end.  Significant history per Ms. Rosita Fire recent clinic note:  In 4/17, he was admitted with chest pain concerning for unstable angina.  Angiography showed 85% distal LM with 90% ostial LCx stenosis.  LIMA was patent but proximal LAD, proximal RCA, and all SVGs were totally occluded. High had successful DES to LM and PTCA to ostial LCx with Impella support.  [name] was stopped and he was put back on low dose lisinopril due to symptomatic hypotension.  He had Cardiolite in 11/17 with scar, no ischemia.   He was admitted in 1/19 with AKI, creatinine up to 3. Meds held then restarted.   Echo 3/19 with EF 30-35%.   He was admitted with syncope and AKI in 6/19.  He was thought to be dehydrated and orthostatic. Losartan stopped but eventually restarted.   He was noted to have prolonged atrial fibrillation by device check in 3/20, now on Eliquis.   Echo was done in 6/20, showing EF 25-30% with inferior/inferolateral AK, moderate LV dilation, mild LVH, mild MR, severe LAE.    CPX in 11/20 showed moderate functional limitation due to HF.  He went into atrial fibrillation in 6/21, had TEE-guided DCCV in 6/21 back to NSR.  TEE showed EF 25-30%, moderately dilated LV, normal RV.   Saw PCP on 04/21/20 and lasix + farxiga stopped due to othostatic hypotension. SBP dropped to the 70s when standing. Also Ranexa was cut back from 1000-->500 mg.   At clinic follow up on 1/22 was having issue with low blood pressure and dizziness when standing. Not very active at home.  Weight at home 176-178  pounds. Eplerenone decreased to  25 mg and scheduled at bedtime.  Around 05/24/20 OptiVol was trending up, Coreg decreased to 3.125 mg bid and lasix added back 80 mg q AM/40 mg q PM.   Past Medical History:  Diagnosis Date  . CAD (coronary artery disease)    a. s/p CABG 1997 b. PCI (BMS) of SVG to RCA 3/09 c. redo CABG 02/2011 with SVG to PDA, SVG to Lcx d. Cath 07/2013: ischemic cardiomyopathy with LVEF less than 20%, occlusion of the SVG's placed in 2012 e.stenting of LM in 07/2015 with PCI of LCx performed as well  . CHF (congestive heart failure) ([place])   . Chronic systolic dysfunction of left ventricle    EF 30%  . Depression   . DJD (degenerative joint disease)   . DM (diabetes mellitus) ([place]) 02/14/2011  . GERD (gastroesophageal reflux disease)   . Gout   . HTN (hypertension) 02/14/2011  . Hyperlipemia   . Ischemic cardiomyopathy    s/p ICD Implantation by Dr Leonia Reeves  . Paroxysmal atrial fibrillation ([place]) 10/22/2014   single episode of AF x 3 hours 48 minutes recorded on ICD, chads2vasc score is at least 5   . Renal disorder     Past Surgical History:  Procedure Laterality Date  . BI-VENTRICULAR IMPLANTABLE CARDIOVERTER DEFIBRILLATOR UPGRADE N/A 08/24/2011   Procedure: BI-VENTRICULAR IMPLANTABLE CARDIOVERTER DEFIBRILLATOR UPGRADE;  Surgeon: Evans Lance, MD;  Location: [facility] CATH LAB;  Service: Cardiovascular;  Laterality: N/A;  . BIV ICD GENERATOR CHANGEOUT N/A 02/13/2018   Procedure: BIV ICD GENERATOR CHANGEOUT;  Surgeon: Thompson Grayer, MD;  Location: [place] CV LAB;  Service: Cardiovascular;  Laterality: N/A;  . CARDIAC CATHETERIZATION  08/03/2015   Procedure: Left Heart Cath and Cors/Grafts Angiography;  Surgeon: Peter M Martinique, MD;  Location: [place] CV LAB;  Service: Cardiovascular;;  . CARDIAC CATHETERIZATION N/A 08/06/2015   Procedure: Coronary Stent Intervention w/Impella;  Surgeon: Jettie Booze, MD;  Location: [place] CV LAB;  Service: Cardiovascular;  Laterality: N/A;  . CARDIAC  CATHETERIZATION  08/06/2015   Procedure: Left Heart Cath;  Surgeon: Jettie Booze, MD;  Location: [place] CV LAB;  Service: Cardiovascular;;  . CARDIAC CATHETERIZATION  08/06/2015   Procedure: Coronary Balloon Angioplasty;  Surgeon: Jettie Booze, MD;  Location: [place] CV LAB;  Service: Cardiovascular;;  . CARDIAC DEFIBRILLATOR PLACEMENT  08/2004   initial placement, upgraded to [place] ICD by Dr Lovena Le 08/24/11 (MDT)  . CARDIOVERSION N/A 09/26/2019   Procedure: CARDIOVERSION;  Surgeon: Larey Dresser, MD;  Location: [facility] ENDOSCOPY;  Service: Cardiovascular;  Laterality: N/A;  . CATARACT EXTRACTION W/ INTRAOCULAR LENS  IMPLANT, BILATERAL  2012  . CHOLECYSTECTOMY  01/05/2012   Procedure: LAPAROSCOPIC CHOLECYSTECTOMY WITH INTRAOPERATIVE CHOLANGIOGRAM;  Surgeon: Joyice Faster. Cornett, MD;  Location: [place];  Service: General;  Laterality: N/A;  laparoscopic cholecysectoym with intraoperative cholangiogram  . COLONOSCOPY WITH PROPOFOL N/A 11/18/2013   Procedure: COLONOSCOPY WITH PROPOFOL;  Surgeon: Garlan Fair, MD;  Location: [facility]  ENDOS[facility];  Service: Endoscopy;  Laterality: N/A;  . CORONARY ANGIOPLASTY WITH STENT PLACEMENT  06/2007   BMS to SVG to RCA  . CORONARY ARTERY BYPASS GRAFT  01/1996   CABG x 5 LIMA to LAD SVG to diag1,2,svg to om,svg to RCA  . CORONARY ARTERY BYPASS GRAFT  03/06/2011   CABG X2; Procedure: REDO CORONARY ARTERY BYPASS GRAFTING (CABG);  Surgeon: Grace Isaac, MD;  Location: [place];  Service: Open Heart Surgery;  Laterality: N/A;  times two grafts using right saphenous vein harvested endoscopically.  Marland Kitchen KNEE ARTHROTOMY  ~ 1978   RIGHT KNEE CARTILAGE REMOVED  . LEFT HEART CATHETERIZATION WITH CORONARY ANGIOGRAM N/A 06/06/2011   Procedure: LEFT HEART CATHETERIZATION WITH CORONARY ANGIOGRAM;  Surgeon: Sueanne Margarita, MD;  Location: [place] CATH LAB;  Service: Cardiovascular;  Laterality: N/A;  . LEFT HEART CATHETERIZATION WITH CORONARY/GRAFT ANGIOGRAM N/A 08/08/2013    Procedure: LEFT HEART CATHETERIZATION WITH Beatrix Fetters;  Surgeon: Sinclair Grooms, MD;  Location: [facility] CATH LAB;  Service: Cardiovascular;  Laterality: N/A;  . LUMBAR [place] SURGERY  2003  . RIGHT HEART CATHETERIZATION N/A 02/17/2014   Procedure: RIGHT HEART CATH;  Surgeon: Larey Dresser, MD;  Location: [facility] CATH LAB;  Service: Cardiovascular;  Laterality: N/A;  . TEE WITHOUT CARDIOVERSION N/A 09/26/2019   Procedure: TRANSESOPHAGEAL ECHOCARDIOGRAM (TEE);  Surgeon: Larey Dresser, MD;  Location: [facility] ENDOSCOPY;  Service: Cardiovascular;  Laterality: N/A;  . VENOGRAM N/A 06/08/2011   Procedure: VENOGRAM;  Surgeon: Thompson Grayer, MD;  Location: [facility] CATH LAB;  Service: Cardiovascular;  Laterality: N/A;     Medications Prior to Admission: Prior to Admission medications   Medication Sig Start Date End Date Taking? Authorizing Provider  acetaminophen (TYLENOL) 500 MG tablet Take 1,000 mg by mouth every 6 (six) hours as needed for moderate pain.   Yes [provider]  allopurinol (ZYLOPRIM) 100 MG tablet Take 200 mg by mouth daily.   Yes [provider]  atorvastatin (LIPITOR) 80 MG tablet TAKE 1 TABLET BY MOUTH ONCE DAILY AT  6PM  IN  THE  EVENING 08/13/19  Yes Larey Dresser, MD  Continuous Blood Gluc Sensor (FREESTYLE LIBRE 14 DAY SENSOR) MISC USE TO CHECK GLUCOSE THREE TIMES DAILY AS DIRECTED 11/06/18  Yes [provider]  dapagliflozin propanediol (FARXIGA) 10 MG TABS tablet Take 1 tablet (10 mg total) by mouth daily before breakfast. 06/29/20  Yes Rosita Fire, Brittainy M, PA-C  ELIQUIS 5 MG TABS tablet Take 1 tablet by mouth twice daily 02/20/20  Yes Allred, Jeneen Rinks, MD  eplerenone (INSPRA) 25 MG tablet Take 1 tablet (25 mg total) by mouth at bedtime. 04/27/20  Yes Clegg, Amy D, NP  furosemide (LASIX) 40 MG tablet Take 2 tablets (80 mg total) by mouth every morning AND 1 tablet (40 mg total) every evening. 05/26/20 08/24/20 Yes Larey Dresser, MD  Insulin Isophane & Regular  Human (NOVOLIN 70/30 FLEXPEN) (70-30) 100 UNIT/ML PEN Inject 40 Units into the skin daily before lunch.    Yes [provider]  loperamide (IMODIUM A-D) 2 MG tablet Take 2 mg by mouth 4 (four) times daily as needed for diarrhea or loose stools.   Yes [provider]  losartan (COZAAR) 25 MG tablet TAKE 1/2 (ONE-HALF) TABLET BY MOUTH ONCE DAILY AT BEDTIME 04/13/20  Yes Larey Dresser, MD  metoprolol succinate (TOPROL XL) 25 MG 24 hr tablet Take 0.5 tablets (12.5 mg total) by mouth daily. 06/29/20 06/29/21 Yes Consuelo Pandy, PA-C  QUEtiapine (SEROQUEL) 100 MG tablet Take 100 mg by mouth at bedtime. Taking 0.5 tablet at bedtime   Yes [provider]  ranolazine (RANEXA) 500 MG 12 hr tablet Take 500 mg by mouth 2 (two) times daily.   Yes [provider]  triamcinolone cream (KENALOG) 0.1 % Apply 1 application topically 2 (two) times daily as needed (for dry/irritated skin.).   Yes [provider]  venlafaxine (EFFEXOR-XR) 150 MG 24 hr capsule Take 150 mg by mouth daily with breakfast.   Yes [provider]  vitamin B-12 (CYANOCOBALAMIN) 1000 MCG tablet Take 1,000 mcg by mouth daily.   Yes [provider]  Homeopathic Products [facility] ALLERGY EYE RELIEF OP) Place 1 drop into both eyes daily as needed (irritation).    [provider]  nitroGLYCERIN (NITROSTAT) 0.4 MG SL tablet Place 1 tablet (0.4 mg total) under the tongue every 5 (five) minutes as needed for chest pain. DO NOT EXCEED A TOTAL OF 3 DOSES IN 15 MINUTES 10/01/19   Larey Dresser, MD     Allergies:    Allergies  Allergen Reactions  . Codeine Nausea And Vomiting  . Metformin Hcl Other (See Comments)  . Other Other (See Comments)  . Trazodone Other (See Comments)  . Adhesive [Tape] Rash    Social History:   Social History   Socioeconomic History  . Marital status: Married    Spouse name: Not on file  . Number of children: Not on file  . Years of  education: Not on file  . Highest education level: Not on file  Occupational History  . Not on file  Tobacco Use  . Smoking status: Former Smoker    Packs/day: 0.50    Years: 15.00    Pack years: 7.50    Types: Cigarettes    Quit date: 04/17/1969    Years since quitting: 51.3  . Smokeless tobacco: Former Systems developer    Quit date: 05/05/1971  Vaping Use  . Vaping Use: Never used  Substance and Sexual Activity  . Alcohol use: Yes    Alcohol/week: 1.0 standard drink    Types: 1 Cans of beer per week    Comment: occasional  . Drug use: No  . Sexual activity: Never  Other Topics Concern  . Not on file  Social History Narrative   Lives in [place], retired Theatre manager for [employer].  He collects antique metal toys   Social Determinants of Health   Financial Resource Strain: Not on file  Food Insecurity: Not on file  Transportation Needs: Not on file  Physical Activity: Not on file  Stress: Not on file  Social Connections: Not on file  Intimate Partner Violence: Not on file    Family History:  Reviewed and noncontributory. The patient's family history includes Cancer in his brother; Coronary artery disease in an other family member; Heart attack in his brother, father, and mother; Heart failure in his father and mother; Sudden Cardiac Death in his brother.    ROS:  Please see the history of present illness.  All other ROS reviewed and negative.     Physical Exam/Data:   Vitals:   07/23/20 2130 07/23/20 2200 07/23/20 2245 07/23/20 2315  BP: 103/61 120/64 118/65 123/66  Pulse: 84 82 81 84  Resp: 17 16 15 19   Temp:      TempSrc:      SpO2: 93% 95% 94% 96%  Weight:      Height:  No intake or output data in the 24 hours ending 07/24/20 0120 Last 3 Weights 07/23/2020 06/29/2020 06/01/2020  Weight (lbs) 177 lb 179 lb 12.8 oz 178 lb 9.6 oz  Weight (kg) 80.287 kg 81.557 kg 81.012 kg     Body mass index is 26.14 kg/m.  General:  Well nourished, well developed,  in no acute distress HEENT: normal Lymph: no adenopathy Neck: no significant JVD Endocrine:  No thryomegaly Vascular: No carotid bruits; FA pulses 2+ bilaterally without bruits  Cardiac:  normal S1, S2; RRR; no murmur Lungs:  clear to auscultation bilaterally, no wheezing, rhonchi or rales  Abd: soft, nontender, no hepatomegaly  Ext: no edema Musculoskeletal:  No deformities, BUE and BLE strength normal and equal Skin: warm and dry  Neuro:  CNs 2-12 intact, no focal abnormalities noted Psych:  Normal affect   EKG:  The ECG that was done sinus rhythm with biventricular pacing.  Relevant CV Studies: TTE 08/14/19 1. Scarring and akinesis of the left ventricular inferior wall and  inferolateral wall, consistent with infarction in the right coronary  territory, possibly also the left circumflex coronary artery territory.  There is moderate hypokinesis of the  inferoseptal wall.  2. The left ventricle has severely reduced systolic function, with an  ejection fraction of 25-30%. Calculated LV EF is 29% by the biplane  modified Simpson's method. The cavity size was moderately dilated. There  is mild concentric left ventricular  hypertrophy. Left ventricular diastolic Doppler parameters are consistent  with pseudonormalization, but the pattern changes to impaired relaxation  with breathing. Mildly elevated mean left atrial pressure.  3. The right ventricle has normal systolic function. The cavity was  normal. There is no increase in right ventricular wall thickness. Right  ventricular systolic pressure could not be assessed.  4. Left atrial size was severely dilated.  5. There is mild mitral annular calcification present. The MR jet is  centrally-directed.  6. Mild thickening of the aortic valve. No stenosis of the aortic valve.  7. The aortic root is normal in size and structure.   Vasodilator myocardial perfusion imaging 03/17/16  Nuclear stress EF: 20%. Severe hypokinesis of  the inferolateral wall segments.  Defect 1: There is a large defect of severe severity present in the basal inferior, basal inferolateral, mid inferior, mid inferolateral and apical inferior location.  There was no ST segment deviation noted during stress.  Findings consistent with prior myocardial infarction.  This is an intermediate risk study. Markedly reduced ejection fraction with inferolateral infarct. No ischemia. Ischemic cardiomyopathy.  PCI 07/2015  LM lesion, 85% stenosed. Post intervention with a 4.0 x 12 Synergy drug eluting stent, there is a 0% residual stenosis.  Ost Cx to Prox Cx lesion, 90% stenosed. Post intervention with balloon angioplasty (2.5 x 20 balloon), there is a 20% residual stenosis.  PCI performed with Impella hemodynamic support.   Laboratory Data:  High Sensitivity Troponin:   Recent Labs  Lab 07/23/20 2034 07/23/20 2334  TROPONINIHS 33* 102*      Chemistry Recent Labs  Lab 07/23/20 2034  NA 136  K 3.7  CL 100  CO2 26  GLUCOSE 209*  BUN 23  CREATININE 1.65*  CALCIUM 8.4*  GFRNONAA 43*  ANIONGAP 10    No results for input(s): PROT, ALBUMIN, AST, ALT, ALKPHOS, BILITOT in the last 168 hours. Hematology Recent Labs  Lab 07/23/20 2034  WBC 8.5  RBC 4.35  HGB 13.7  HCT 42.7  MCV 98.2  MCH 31.5  MCHC 32.1  RDW 14.9  PLT 258   BNPNo results for input(s): BNP, PROBNP in the last 168 hours.  DDimer No results for input(s): DDIMER in the last 168 hours.   Radiology/Studies:  DG Chest Port 1 View  Result Date: 07/24/2020 CLINICAL DATA:  Defer burr later discharge LOC EXAM: PORTABLE CHEST 1 VIEW COMPARISON:  07/23/2020, 10/29/2019 FINDINGS: Post sternotomy changes. Left-sided pacing device with multiple intracardiac leads. Mildly diminished lung volumes. Stable cardiomediastinal silhouette. No acute airspace disease or pneumothorax. IMPRESSION: Low lung volumes. No active disease. Electronically Signed   By: Donavan Foil M.D.   On:  07/24/2020 00:40   DG Chest Portable 1 View  Result Date: 07/23/2020 CLINICAL DATA:  Infection evaluation. EXAM: PORTABLE CHEST 1 VIEW COMPARISON:  October 29, 2019 FINDINGS: Multiple sternal wires and vascular clips are seen. A multi lead AICD is noted. There are decreased lung volumes which is likely secondary to the degree of patient inspiration. There is no evidence of acute infiltrate, pleural effusion or pneumothorax. The cardiac silhouette is mildly enlarged. Degenerative changes seen throughout the thoracic spine. IMPRESSION: 1. Evidence of prior median sternotomy/CABG. 2. No acute or active cardiopulmonary disease. Electronically Signed   By: Virgina Norfolk M.D.   On: 07/23/2020 20:53    Assessment and Plan:   [name] is a 80 y.o. male with ischemic cardiomyopathy s/p MDT CRT-D with HFrEF, 25-30%, two prior CABG most recently 2012 and subsequent LM PCI/LCx in 2017, CKD3b/4, pAF on apixaban who presents after his ICD discharged today.  He received two appropriate shocks on interrogation.  No overt signs of acute ischemia, electrolytes are relatively stable, and he does not appear to be in acutely decompensated heart failure (actually quite euvolemic).  He has large inferior and inferolateral scar extending to the apex so nidus for monomorphic VT is clearly present.  Will defer advanced antiarrhythmic choice and necessity of cMRI for possible ablation to electrophysiology.  I've made no changes to his device.  Plan #Appropriate ICD shock - Fast monomorphic VT on review of EGM in VF zone (>200) - First event - No clear precipitant; suspect natural history of his ischemic cardiomyopathy with extensive inferolateral scar. - Discuss with EP in morning regarding antiarrhythmic, candidacy for ablation, device parameters  #Chronic systolic and diastolic heart failure due to ischemic cardiomyopathy.  NHYAIII/IV.  Proflie A. S/p CRT-D with LBBB for now secondary prevention. - Continue  metoprolol succinate 12.5 mg daily - Substitute spironolactone 25 mg daily for home eplerenone - Continue losartan 12.5 mg daily - Continue dapagliflozin - Dose reduce to 40 mg BID furosemide; appears euvolemic at presentation here.  #Ischemic cardiomyopathy with prior CABG x 2 - Continue apixaban - Continue atorvastatin 80 mg daily  #Diabetes - Sliding scale with scheduled mealtime coverage  #pAF - Low dose beta blocker - Apixaban 5 mg BID  #Depression - Continue effexor  Full code Regular diet Monitor on telemetry  Risk Assessment/Risk Scores:   Severity of Illness: The appropriate patient status for this patient is OBSERVATION. Observation status is judged to be reasonable and necessary in order to provide the required intensity of service to ensure the patient's safety. The patient's presenting symptoms, physical exam findings, and initial radiographic and laboratory data in the context of their medical condition is felt to place them at decreased risk for further clinical deterioration. Furthermore, it is anticipated that the patient will be medically stable for discharge from the hospital within 2 midnights of admission. The following  factors support the patient status of observation.   " The patient's presenting symptoms include appropriate ICD discharge. " The physical exam findings include well appearing. " The initial radiographic and laboratory data are unremarkable but device interrogation shows recurrent VT during last 6 hours.  For questions or updates, please contact [place] Please consult [email or web address] for contact info under     Signed, Tacarra Justo T Tae Robak, MD  07/24/2020 1:20 AM

## 2020-07-25 ENCOUNTER — Encounter (HOSPITAL_COMMUNITY): Payer: Self-pay | Admitting: Student

## 2020-07-25 ENCOUNTER — Other Ambulatory Visit: Payer: Self-pay

## 2020-07-25 DIAGNOSIS — I255 Ischemic cardiomyopathy: Secondary | ICD-10-CM

## 2020-07-25 DIAGNOSIS — I472 Ventricular tachycardia: Secondary | ICD-10-CM | POA: Diagnosis not present

## 2020-07-25 LAB — BASIC METABOLIC PANEL
Anion gap: 8 (ref 5–15)
BUN: 25 mg/dL — ABNORMAL HIGH (ref 8–23)
CO2: 24 mmol/L (ref 22–32)
Calcium: 8.3 mg/dL — ABNORMAL LOW (ref 8.9–10.3)
Chloride: 102 mmol/L (ref 98–111)
Creatinine, Ser: 1.7 mg/dL — ABNORMAL HIGH (ref 0.61–1.24)
GFR, Estimated: 41 mL/min — ABNORMAL LOW (ref >60)
Glucose, Bld: 161 mg/dL — ABNORMAL HIGH (ref 70–99)
Potassium: 3.6 mmol/L (ref 3.5–5.1)
Sodium: 134 mmol/L — ABNORMAL LOW (ref 135–145)

## 2020-07-25 LAB — GLUCOSE, CAPILLARY
Glucose-Capillary: 160 mg/dL — ABNORMAL HIGH (ref 70–99)
Glucose-Capillary: 148 mg/dL — ABNORMAL HIGH (ref 70–99)
Glucose-Capillary: 157 mg/dL — ABNORMAL HIGH (ref 70–99)
Glucose-Capillary: 130 mg/dL — ABNORMAL HIGH (ref 70–99)

## 2020-07-25 MED ORDER — POTASSIUM CHLORIDE CRYS ER 20 MEQ PO TBCR
40.0000 meq | EXTENDED_RELEASE_TABLET | Freq: Once | ORAL | Status: AC
  Administered 2020-07-25: 40 meq via ORAL
  Filled 2020-07-25: qty 2

## 2020-07-25 NOTE — Evaluation (Signed)
Physical Therapy Evaluation Patient Details Name: [name] MRN: [id number] DOB: 12-21-1940 Today's Date: 07/25/2020   History of Present Illness  80 y.o. male presents to [facility] ED on 07/23/2020 after becoming unresponsive at dinner, ICD was found to have discharged. Interrogation of ICD found multiple episodes of Vfib. PMH includes ischemic cardiomyopathy s/p MDT CRT-D with HFrEF, 25-30%, two prior CABG most recently 2012 and subsequent LM PCI/LCx in 2017, CKD3b/4, pAF.  Clinical Impression  Pt reports to have been independent prior to hospital admission, but wife reported that pt's lower extremity strength has declined recently. Pt demonstrated increased global weakness and decreased activity tolerance when attempting to roll on to side, requiring max verbal cues. Physical assistance was required to come to sitting and when asked to stand, pt was unsuccessful in attempts to do so, requiring 2 person assist to power to stand. Further mobility limited by symptomatic hypotension. Pt will benefit from acute PT to address limitations in lower extremity strength and tolerance to activity to improve functional mobility and independence in the home, and to decrease caregiver burden.     Follow Up Recommendations SNF    Equipment Recommendations  Wheelchair (measurements PT);Wheelchair cushion (measurements PT);Hospital bed;Other (comment);3in1 (PT) (mechanical lift)    Recommendations for Other Services       Precautions / Restrictions Precautions Precautions: Fall Restrictions Weight Bearing Restrictions: No      Mobility  Bed Mobility Overal bed mobility: Needs Assistance Bed Mobility: Rolling;Sidelying to Sit;Sit to Supine Rolling: Min assist (Pt required multiple verbal and manual cues for rolling.) Sidelying to sit: Mod assist (Pt required multiple verbal cues, mod assist was given to accomplish.)   Sit to supine: Mod assist (Pt leaned back in sitting indicating he was ready to lie  down prior to verbalizing.)   General bed mobility comments: Pt requires verbal and physical cuing, but is unsuccessful without physical asssitance.    Transfers Overall transfer level: Needs assistance Equipment used: 2 person hand held assist (Pt required assistance to power up to stand.) Transfers: Sit to/from Stand Sit to Stand: +2 physical assistance;Mod assist         General transfer comment: Pt's ability to transfer relies upon phyiscal assistance as verbal cues/instructions are insufficient d/t generalized weakness in LE Pt had difficulties in extending hips, with success when verball cued to extend chest out.  Ambulation/Gait                Stairs            Wheelchair Mobility    Modified Rankin (Stroke Patients Only)       Balance Overall balance assessment: Needs assistance Sitting-balance support: Single extremity supported;Feet supported Sitting balance-Leahy Scale: Poor   Postural control: Posterior lean;Right lateral lean Standing balance support: Bilateral upper extremity supported Standing balance-Leahy Scale: Poor Standing balance comment: Pt requires 2 person hand held assist during standing.                             Pertinent Vitals/Pain Pain Assessment: Faces Faces Pain Scale: Hurts little more Pain Location: L knee Pain Descriptors / Indicators: Grimacing Pain Intervention(s): Limited activity within patient's tolerance;Monitored during session;Repositioned    Home Living Family/patient expects to be discharged to:: Private residence Living Arrangements: Spouse/significant other Available Help at Discharge: Family;Available 24 hours/day Type of Home: House Home Access: Stairs to enter Entrance Stairs-Rails: None Entrance Stairs-Number of Steps: 1 Home Layout: Two level;Laundry or work  area in basement Home Equipment: [place] - 4 wheels;Cane - single point      Prior Function Level of Independence: Independent          Comments: Wife indicates a decline in mobility status over the last few weeks.     Hand Dominance        Extremity/Trunk Assessment   Upper Extremity Assessment Upper Extremity Assessment: Defer to OT evaluation    Lower Extremity Assessment Lower Extremity Assessment: Generalized weakness (Pt's wife reports his legs have gotten weaker)       Communication   Communication: HOH  Cognition Arousal/Alertness: Awake/alert Behavior During Therapy: WFL for tasks assessed/performed Overall Cognitive Status: Impaired/Different from baseline Area of Impairment: Problem solving                             Problem Solving: Requires verbal cues;Slow processing        General Comments General comments (skin integrity, edema, etc.): Pt demonstrates decreased tolerance to activity secondary to deconditioning and general LE weakness.Pt reported feeling dizzy upon standing and requested to sit back down. BP was taken once sitting and dropped to 70/50. Pt requested to perform foot taps in attempt to elevate BP, pt asked to lie back down.    Exercises     Assessment/Plan    PT Assessment Patient needs continued PT services  PT Problem List Decreased strength;Decreased activity tolerance;Decreased balance;Decreased mobility;Decreased knowledge of use of DME;Pain       PT Treatment Interventions DME instruction;Gait training;Stair training;Functional mobility training;Therapeutic activities;Therapeutic exercise;Balance training;Neuromuscular re-education;Patient/family education    PT Goals (Current goals can be found in the Care Plan section)  Acute Rehab PT Goals Patient Stated Goal: to get stronger and go home PT Goal Formulation: With patient Time For Goal Achievement: 08/08/20 Potential to Achieve Goals: Fair    Frequency Min 2X/week (See as often as possible for potential progression to home)   Barriers to discharge        Co-evaluation                AM-PAC PT "6 Clicks" Mobility  Outcome Measure Help needed turning from your back to your side while in a flat bed without using bedrails?: A Little Help needed moving from lying on your back to sitting on the side of a flat bed without using bedrails?: A Lot Help needed moving to and from a bed to a chair (including a wheelchair)?: A Lot Help needed standing up from a chair using your arms (e.g., wheelchair or bedside chair)?: A Lot Help needed to walk in hospital room?: A Lot Help needed climbing 3-5 steps with a railing? : A Lot 6 Click Score: 13    End of Session Equipment Utilized During Treatment: Gait belt Activity Tolerance: Patient limited by fatigue;Patient limited by pain Patient left: in bed;with call bell/phone within reach;with family/visitor present Nurse Communication: Mobility status;Need for lift equipment Charlaine Dalton) PT Visit Diagnosis: Unsteadiness on feet (R26.81);Other abnormalities of gait and mobility (R26.89);Muscle weakness (generalized) (M62.81);Difficulty in walking, not elsewhere classified (R26.2);Pain Pain - Right/Left: Left Pain - part of body: Knee    Time: 1510-1540 PT Time Calculation (min) (ACUTE ONLY): 30 min   Charges:   PT Evaluation $PT Eval Low Complexity: 1 Low PT Treatments $Therapeutic Activity: 8-22 mins        Acute Rehab  Pager: [phone]   Garwin Brothers, SPT  07/25/2020, 5:36 PM

## 2020-07-25 NOTE — H&P (View-Only) (Signed)
Progress Note   Subjective   Doing well today, the patient denies CP or SOB.  No new concerns  Inpatient Medications    Scheduled Meds: . allopurinol  200 mg Oral Daily  . amiodarone  200 mg Oral BID  . atorvastatin  80 mg Oral Daily  . dapagliflozin propanediol  10 mg Oral QAC breakfast  . insulin aspart  0-15 Units Subcutaneous TID WC  . insulin aspart  0-5 Units Subcutaneous QHS  . insulin aspart  4 Units Subcutaneous TID WC  . losartan  12.5 mg Oral Daily  . metoprolol succinate  12.5 mg Oral Daily  . QUEtiapine  100 mg Oral QHS  . ranolazine  500 mg Oral BID  . sodium chloride flush  3 mL Intravenous Q12H  . spironolactone  25 mg Oral Daily  . venlafaxine XR  150 mg Oral Q breakfast   Continuous Infusions: . sodium chloride     PRN Meds: sodium chloride, acetaminophen, ondansetron (ZOFRAN) IV, sodium chloride flush   Vital Signs    Vitals:   07/25/20 0324 07/25/20 0400 07/25/20 0600 07/25/20 0700  BP: (!) 117/59 117/60 118/65 (!) 115/59  Pulse: (!) 111 93 87 86  Resp: (!) 23 (!) 22 (!) 27 16  Temp: (!) 97.5 F (36.4 C)   98.6 F (37 C)  TempSrc: Oral   Oral  SpO2: 92% 95% 96% 95%  Weight:      Height:        Intake/Output Summary (Last 24 hours) at 07/25/2020 0933 Last data filed at 07/25/2020 0730 Gross per 24 hour  Intake 720 ml  Output 500 ml  Net 220 ml   Filed Weights   07/23/20 2029 07/24/20 0200 07/25/20 0300  Weight: 80.3 kg 79.1 kg 80.2 kg    Telemetry    Sinus with BiV pacing, PVCs - Personally Reviewed  Physical Exam   GEN- The patient is chronically ill appearing, alert    Head- normocephalic, atraumatic Eyes-  Sclera clear, conjunctiva pink Ears- hearing intact Oropharynx- clear Neck- supple, Lungs-  normal work of breathing Heart- Regular rate and rhythm  GI- soft  Extremities- no clubbing, cyanosis, or edema  MS- no significant deformity or atrophy Skin- no rash or lesion Psych- euthymic mood, full affect Neuro-  strength and sensation are intact   Labs    Chemistry Recent Labs  Lab 07/23/20 2034 07/24/20 0414  NA 136 135  K 3.7 3.1*  CL 100 102  CO2 26 24  GLUCOSE 209* 118*  BUN 23 26*  CREATININE 1.65* 1.53*  CALCIUM 8.4* 8.3*  GFRNONAA 43* 47*  ANIONGAP 10 9     Hematology Recent Labs  Lab 07/23/20 2034  WBC 8.5  RBC 4.35  HGB 13.7  HCT 42.7  MCV 98.2  MCH 31.5  MCHC 32.1  RDW 14.9  PLT 258     Patient ID  [name] is a 80 y.o. male with a h/o ischemic CM/ CAD and HTN who is well known to EP and the CHF team.  He is admitted with ICD shocks for monomorphic VT.   He has had substantial decline over the past 6 months.  He has fatigue and poor energy.  He has frequent hypotension and weakness.  He was seen last in the CHF clinic in March.  Medical therapy has been limited by hypotension.  Assessment & Plan    1.  VT Currently doing well with amiodarone 200mg  BID.  Transition to amiodarone  200mg  daily at discharge. Keep K >3.9, Mg >1.9 Labs pending for this am No driving x 6 months (pt  Aware) Follow-up with me in 4 weeks  2. Chronic systolic dysfunction/ ischemic CM/ CAD Discussed with Dr Aundra Dubin yesterday We agree that RHC/LHC would be beneficial. Hypotension has limited medical therapy recently Holding lasix due to dehydration  3. Paroxysmal atrial fibrillation Well controlled by device interrogation Holding eliquis for cath  4. Stage IIIB renal failure Stable No change required today Complicates medical therapy  5. Recent postural hypotension Lasix on hold Chronically hypotensive due to poor cardiac function  Long term prognosis is limited by worsening cognitive function, renal function, and cardiac function.  Ultimately, goals of care conversations may be of value.  Thompson Grayer MD, [facility] 07/25/2020 9:33 AM

## 2020-07-25 NOTE — Plan of Care (Signed)
  Problem: Education: Goal: Knowledge of General Education information will improve Description: Including pain rating scale, medication(s)/side effects and non-pharmacologic comfort measures Outcome: Progressing   Problem: Clinical Measurements: Goal: Cardiovascular complication will be avoided Outcome: Progressing   Problem: Activity: Goal: Risk for activity intolerance will decrease Outcome: Progressing   Problem: Nutrition: Goal: Adequate nutrition will be maintained Outcome: Progressing   Problem: Elimination: Goal: Will not experience complications related to urinary retention Outcome: Progressing   Problem: Safety: Goal: Ability to remain free from injury will improve Outcome: Progressing   Problem: Skin Integrity: Goal: Risk for impaired skin integrity will decrease Outcome: Progressing

## 2020-07-25 NOTE — Progress Notes (Signed)
Progress Note   Subjective   Doing well today, the patient denies CP or SOB.  No new concerns  Inpatient Medications    Scheduled Meds: . allopurinol  200 mg Oral Daily  . amiodarone  200 mg Oral BID  . atorvastatin  80 mg Oral Daily  . dapagliflozin propanediol  10 mg Oral QAC breakfast  . insulin aspart  0-15 Units Subcutaneous TID WC  . insulin aspart  0-5 Units Subcutaneous QHS  . insulin aspart  4 Units Subcutaneous TID WC  . losartan  12.5 mg Oral Daily  . metoprolol succinate  12.5 mg Oral Daily  . QUEtiapine  100 mg Oral QHS  . ranolazine  500 mg Oral BID  . sodium chloride flush  3 mL Intravenous Q12H  . spironolactone  25 mg Oral Daily  . venlafaxine XR  150 mg Oral Q breakfast   Continuous Infusions: . sodium chloride     PRN Meds: sodium chloride, acetaminophen, ondansetron (ZOFRAN) IV, sodium chloride flush   Vital Signs    Vitals:   07/25/20 0324 07/25/20 0400 07/25/20 0600 07/25/20 0700  BP: (!) 117/59 117/60 118/65 (!) 115/59  Pulse: (!) 111 93 87 86  Resp: (!) 23 (!) 22 (!) 27 16  Temp: (!) 97.5 F (36.4 C)   98.6 F (37 C)  TempSrc: Oral   Oral  SpO2: 92% 95% 96% 95%  Weight:      Height:        Intake/Output Summary (Last 24 hours) at 07/25/2020 0933 Last data filed at 07/25/2020 0730 Gross per 24 hour  Intake 720 ml  Output 500 ml  Net 220 ml   Filed Weights   07/23/20 2029 07/24/20 0200 07/25/20 0300  Weight: 80.3 kg 79.1 kg 80.2 kg    Telemetry    Sinus with BiV pacing, PVCs - Personally Reviewed  Physical Exam   GEN- The patient is chronically ill appearing, alert    Head- normocephalic, atraumatic Eyes-  Sclera clear, conjunctiva pink Ears- hearing intact Oropharynx- clear Neck- supple, Lungs-  normal work of breathing Heart- Regular rate and rhythm  GI- soft  Extremities- no clubbing, cyanosis, or edema  MS- no significant deformity or atrophy Skin- no rash or lesion Psych- euthymic mood, full affect Neuro-  strength and sensation are intact   Labs    Chemistry Recent Labs  Lab 07/23/20 2034 07/24/20 0414  NA 136 135  K 3.7 3.1*  CL 100 102  CO2 26 24  GLUCOSE 209* 118*  BUN 23 26*  CREATININE 1.65* 1.53*  CALCIUM 8.4* 8.3*  GFRNONAA 43* 47*  ANIONGAP 10 9     Hematology Recent Labs  Lab 07/23/20 2034  WBC 8.5  RBC 4.35  HGB 13.7  HCT 42.7  MCV 98.2  MCH 31.5  MCHC 32.1  RDW 14.9  PLT 258     Patient ID  [name] is a 80 y.o. male with a h/o ischemic CM/ CAD and HTN who is well known to EP and the CHF team.  He is admitted with ICD shocks for monomorphic VT.   He has had substantial decline over the past 6 months.  He has fatigue and poor energy.  He has frequent hypotension and weakness.  He was seen last in the CHF clinic in March.  Medical therapy has been limited by hypotension.  Assessment & Plan    1.  VT Currently doing well with amiodarone 200mg  BID.  Transition to amiodarone  200mg  daily at discharge. Keep K >3.9, Mg >1.9 Labs pending for this am No driving x 6 months (pt  Aware) Follow-up with me in 4 weeks  2. Chronic systolic dysfunction/ ischemic CM/ CAD Discussed with Dr Aundra Dubin yesterday We agree that RHC/LHC would be beneficial. Hypotension has limited medical therapy recently Holding lasix due to dehydration  3. Paroxysmal atrial fibrillation Well controlled by device interrogation Holding eliquis for cath  4. Stage IIIB renal failure Stable No change required today Complicates medical therapy  5. Recent postural hypotension Lasix on hold Chronically hypotensive due to poor cardiac function  Long term prognosis is limited by worsening cognitive function, renal function, and cardiac function.  Ultimately, goals of care conversations may be of value.  Thompson Grayer MD, [facility] 07/25/2020 9:33 AM

## 2020-07-26 ENCOUNTER — Encounter (HOSPITAL_COMMUNITY): Payer: Self-pay | Admitting: Cardiology

## 2020-07-26 ENCOUNTER — Encounter (HOSPITAL_COMMUNITY)
Admission: EM | Disposition: A | Discharge status: Home Health | Payer: Self-pay | Source: Home / Self Care | Attending: Cardiology

## 2020-07-26 DIAGNOSIS — N1831 Chronic kidney disease, stage 3a: Secondary | ICD-10-CM

## 2020-07-26 DIAGNOSIS — I5022 Chronic systolic (congestive) heart failure: Secondary | ICD-10-CM

## 2020-07-26 DIAGNOSIS — I2510 Atherosclerotic heart disease of native coronary artery without angina pectoris: Secondary | ICD-10-CM

## 2020-07-26 DIAGNOSIS — I472 Ventricular tachycardia: Secondary | ICD-10-CM | POA: Diagnosis not present

## 2020-07-26 HISTORY — PX: RIGHT/LEFT HEART CATH AND CORONARY/GRAFT ANGIOGRAPHY: CATH118267

## 2020-07-26 LAB — GLUCOSE, CAPILLARY
Glucose-Capillary: 95 mg/dL (ref 70–99)
Glucose-Capillary: 80 mg/dL (ref 70–99)
Glucose-Capillary: 93 mg/dL (ref 70–99)
Glucose-Capillary: 137 mg/dL — ABNORMAL HIGH (ref 70–99)

## 2020-07-26 LAB — CBC
HCT: 39.1 % (ref 39.0–52.0)
Hemoglobin: 13 g/dL (ref 13.0–17.0)
MCH: 32 pg (ref 26.0–34.0)
MCHC: 33.2 g/dL (ref 30.0–36.0)
MCV: 96.3 fL (ref 80.0–100.0)
Platelets: 197 10*3/uL (ref 150–400)
RBC: 4.06 MIL/uL — ABNORMAL LOW (ref 4.22–5.81)
RDW: 15.2 % (ref 11.5–15.5)
WBC: 9.5 10*3/uL (ref 4.0–10.5)
nRBC: 0 % (ref 0.0–0.2)

## 2020-07-26 LAB — BASIC METABOLIC PANEL
Anion gap: 11 (ref 5–15)
BUN: 31 mg/dL — ABNORMAL HIGH (ref 8–23)
CO2: 24 mmol/L (ref 22–32)
Calcium: 8.9 mg/dL (ref 8.9–10.3)
Chloride: 101 mmol/L (ref 98–111)
Creatinine, Ser: 1.82 mg/dL — ABNORMAL HIGH (ref 0.61–1.24)
GFR, Estimated: 38 mL/min — ABNORMAL LOW (ref >60)
Glucose, Bld: 91 mg/dL (ref 70–99)
Potassium: 3.7 mmol/L (ref 3.5–5.1)
Sodium: 136 mmol/L (ref 135–145)

## 2020-07-26 LAB — POCT I-STAT EG7
Acid-base deficit: 1 mmol/L (ref 0.0–2.0)
Acid-base deficit: 2 mmol/L (ref 0.0–2.0)
Bicarbonate: 24 mmol/L (ref 20.0–28.0)
Bicarbonate: 24.6 mmol/L (ref 20.0–28.0)
Calcium, Ion: 1.17 mmol/L (ref 1.15–1.40)
Calcium, Ion: 1.19 mmol/L (ref 1.15–1.40)
HCT: 35 % — ABNORMAL LOW (ref 39.0–52.0)
HCT: 35 % — ABNORMAL LOW (ref 39.0–52.0)
Hemoglobin: 11.9 g/dL — ABNORMAL LOW (ref 13.0–17.0)
Hemoglobin: 11.9 g/dL — ABNORMAL LOW (ref 13.0–17.0)
O2 Saturation: 71 %
O2 Saturation: 74 %
Potassium: 3.3 mmol/L — ABNORMAL LOW (ref 3.5–5.1)
Potassium: 3.3 mmol/L — ABNORMAL LOW (ref 3.5–5.1)
Sodium: 138 mmol/L (ref 135–145)
Sodium: 138 mmol/L (ref 135–145)
TCO2: 25 mmol/L (ref 22–32)
TCO2: 26 mmol/L (ref 22–32)
pCO2, Ven: 42.9 mmHg — ABNORMAL LOW (ref 44.0–60.0)
pCO2, Ven: 43.6 mmHg — ABNORMAL LOW (ref 44.0–60.0)
pH, Ven: 7.355 (ref 7.250–7.430)
pH, Ven: 7.36 (ref 7.250–7.430)
pO2, Ven: 39 mmHg (ref 32.0–45.0)
pO2, Ven: 41 mmHg (ref 32.0–45.0)

## 2020-07-26 SURGERY — RIGHT/LEFT HEART CATH AND CORONARY/GRAFT ANGIOGRAPHY
Anesthesia: LOCAL

## 2020-07-26 MED ORDER — SODIUM CHLORIDE 0.9 % IV SOLN: 250.0000 mL | INTRAVENOUS | Status: DC | PRN

## 2020-07-26 MED ORDER — FENTANYL CITRATE (PF) 100 MCG/2ML IJ SOLN
INTRAMUSCULAR | Status: AC
  Filled 2020-07-26: qty 2

## 2020-07-26 MED ORDER — AMIODARONE HCL 200 MG PO TABS: 200.0000 mg | ORAL_TABLET | Freq: Two times a day (BID) | ORAL | 6 refills | Status: DC

## 2020-07-26 MED ORDER — SODIUM CHLORIDE 0.9% FLUSH
3.0000 mL | Freq: Two times a day (BID) | INTRAVENOUS | Status: DC
  Administered 2020-07-26 – 2020-07-27 (×3): 3 mL via INTRAVENOUS

## 2020-07-26 MED ORDER — ONDANSETRON HCL 4 MG/2ML IJ SOLN: 4.0000 mg | Freq: Four times a day (QID) | INTRAMUSCULAR | Status: DC | PRN

## 2020-07-26 MED ORDER — LOPERAMIDE HCL 2 MG PO CAPS
2.0000 mg | ORAL_CAPSULE | ORAL | Status: DC | PRN
  Administered 2020-07-26: 2 mg via ORAL
  Filled 2020-07-26: qty 1

## 2020-07-26 MED ORDER — FENTANYL CITRATE (PF) 100 MCG/2ML IJ SOLN
INTRAMUSCULAR | Status: DC | PRN
  Administered 2020-07-26: 25 ug via INTRAVENOUS

## 2020-07-26 MED ORDER — APIXABAN 5 MG PO TABS
5.0000 mg | ORAL_TABLET | Freq: Two times a day (BID) | ORAL | Status: DC
  Administered 2020-07-26 – 2020-07-27 (×2): 5 mg via ORAL
  Filled 2020-07-26 (×2): qty 1

## 2020-07-26 MED ORDER — SODIUM CHLORIDE 0.9 % IV SOLN: INTRAVENOUS | Status: AC

## 2020-07-26 MED ORDER — VERAPAMIL HCL 2.5 MG/ML IV SOLN
INTRAVENOUS | Status: AC
  Filled 2020-07-26: qty 2

## 2020-07-26 MED ORDER — HEPARIN SODIUM (PORCINE) 1000 UNIT/ML IJ SOLN
INTRAMUSCULAR | Status: AC
  Filled 2020-07-26: qty 1

## 2020-07-26 MED ORDER — IOHEXOL 350 MG/ML SOLN
INTRAVENOUS | Status: AC
  Filled 2020-07-26: qty 1

## 2020-07-26 MED ORDER — METOPROLOL SUCCINATE ER 25 MG PO TB24: 12.5000 mg | ORAL_TABLET | Freq: Every day | ORAL | 11 refills | Status: DC

## 2020-07-26 MED ORDER — HEPARIN (PORCINE) IN NACL 1000-0.9 UT/500ML-% IV SOLN
INTRAVENOUS | Status: AC
  Filled 2020-07-26: qty 1000

## 2020-07-26 MED ORDER — VERAPAMIL HCL 2.5 MG/ML IV SOLN
INTRAVENOUS | Status: DC | PRN
  Administered 2020-07-26: 10 mL via INTRA_ARTERIAL

## 2020-07-26 MED ORDER — SPIRONOLACTONE 12.5 MG HALF TABLET
12.5000 mg | ORAL_TABLET | Freq: Every day | ORAL | Status: DC
  Administered 2020-07-26 – 2020-07-27 (×2): 12.5 mg via ORAL
  Filled 2020-07-26 (×2): qty 1

## 2020-07-26 MED ORDER — HEPARIN SODIUM (PORCINE) 1000 UNIT/ML IJ SOLN
INTRAMUSCULAR | Status: DC | PRN
  Administered 2020-07-26: 4000 [IU] via INTRAVENOUS

## 2020-07-26 MED ORDER — LABETALOL HCL 5 MG/ML IV SOLN: 10.0000 mg | INTRAVENOUS | Status: DC | PRN

## 2020-07-26 MED ORDER — LIDOCAINE HCL (PF) 1 % IJ SOLN
INTRAMUSCULAR | Status: AC
  Filled 2020-07-26: qty 30

## 2020-07-26 MED ORDER — LIDOCAINE HCL (PF) 1 % IJ SOLN
INTRAMUSCULAR | Status: DC | PRN
  Administered 2020-07-26 (×2): 2 mL

## 2020-07-26 MED ORDER — SODIUM CHLORIDE 0.9% FLUSH: 3.0000 mL | INTRAVENOUS | Status: DC | PRN

## 2020-07-26 MED ORDER — MIDAZOLAM HCL 2 MG/2ML IJ SOLN
INTRAMUSCULAR | Status: DC | PRN
  Administered 2020-07-26: 0.5 mg via INTRAVENOUS

## 2020-07-26 MED ORDER — MIDAZOLAM HCL 2 MG/2ML IJ SOLN
INTRAMUSCULAR | Status: AC
  Filled 2020-07-26: qty 2

## 2020-07-26 MED ORDER — SODIUM CHLORIDE 0.9% FLUSH: 3.0000 mL | Freq: Two times a day (BID) | INTRAVENOUS | Status: DC

## 2020-07-26 MED ORDER — ASPIRIN 81 MG PO CHEW
81.0000 mg | CHEWABLE_TABLET | ORAL | Status: AC
  Administered 2020-07-26: 81 mg via ORAL
  Filled 2020-07-26: qty 1

## 2020-07-26 MED ORDER — HEPARIN (PORCINE) IN NACL 1000-0.9 UT/500ML-% IV SOLN
INTRAVENOUS | Status: DC | PRN
  Administered 2020-07-26 (×2): 500 mL

## 2020-07-26 MED ORDER — SODIUM CHLORIDE 0.9 % IV BOLUS
250.0000 mL | Freq: Once | INTRAVENOUS | Status: AC
  Administered 2020-07-26: 250 mL via INTRAVENOUS

## 2020-07-26 MED ORDER — HYDRALAZINE HCL 20 MG/ML IJ SOLN: 10.0000 mg | INTRAMUSCULAR | Status: DC | PRN

## 2020-07-26 MED ORDER — FUROSEMIDE 40 MG PO TABS: 80.0000 mg | ORAL_TABLET | Freq: Every day | ORAL | 3 refills | Status: DC

## 2020-07-26 MED ORDER — ACETAMINOPHEN 325 MG PO TABS
650.0000 mg | ORAL_TABLET | ORAL | Status: DC | PRN
  Administered 2020-07-26 (×2): 650 mg via ORAL
  Filled 2020-07-26 (×2): qty 2

## 2020-07-26 MED ORDER — AMIODARONE HCL 200 MG PO TABS: 200.0000 mg | ORAL_TABLET | Freq: Every day | ORAL | 6 refills | Status: DC

## 2020-07-26 MED ORDER — SODIUM CHLORIDE 0.9 % IV SOLN: INTRAVENOUS | Status: DC

## 2020-07-26 MED ORDER — IOHEXOL 350 MG/ML SOLN
INTRAVENOUS | Status: DC | PRN
  Administered 2020-07-26: 30 mL

## 2020-07-26 SURGICAL SUPPLY — 12 items
CATH INFINITI 5FR MULTPACK ANG (CATHETERS) ×1 IMPLANT
CATH SWAN GANZ 7F STRAIGHT (CATHETERS) ×1 IMPLANT
DEVICE RAD COMP TR BAND LRG (VASCULAR PRODUCTS) ×1 IMPLANT
GLIDESHEATH SLEND SS 6F .021 (SHEATH) ×1 IMPLANT
GLIDESHEATH SLENDER 7FR .021G (SHEATH) ×1 IMPLANT
GUIDEWIRE INQWIRE 1.5J.035X260 (WIRE) IMPLANT
INQWIRE 1.5J .035X260CM (WIRE) ×2
KIT HEART LEFT (KITS) ×2 IMPLANT
MAT PREVALON FULL STRYKER (MISCELLANEOUS) ×1 IMPLANT
PACK CARDIAC CATHETERIZATION (CUSTOM PROCEDURE TRAY) ×2 IMPLANT
SHEATH PROBE COVER 6X72 (BAG) ×1 IMPLANT
TRANSDUCER W/STOPCOCK (MISCELLANEOUS) ×2 IMPLANT

## 2020-07-26 NOTE — NC FL2 (Signed)
[place] LEVEL OF CARE SCREENING TOOL     IDENTIFICATION  Patient Name: [name] Birthdate: 31-Jul-1940 Sex: male Admission Date (Current Location): 07/23/2020  [facility] and [place] Number:  Herbalist and Address:  [facility] [place], [place] [address] [place], [place] [address]      Provider Number: [id number]  Attending Physician Name and Address:  Larey Dresser, MD  Relative Name and Phone Number:  Archer, Moist (Spouse)   [phone]    Current Level of Care: SNF Recommended Level of Care: [place] Prior Approval Number:    Date Approved/Denied:   PASRR Number: [id number]  Discharge Plan: SNF    Current Diagnoses: Patient Active Problem List   Diagnosis Date Noted  . Ventricular fibrillation ([place]) 07/23/2020  . Syncope and collapse 09/24/2017  . GERD (gastroesophageal reflux disease) 09/24/2017  . Type 2 diabetes mellitus ([place]) 09/24/2017  . Chronic combined systolic and diastolic CHF (congestive heart failure) ([place]) 09/24/2017  . Acute renal failure superimposed on stage 3 chronic kidney disease ([place]) 09/24/2017  . Syncope   . Acute kidney injury ([place]) 05/01/2017  . Confusion 05/01/2017  . NSTEMI (non-ST elevated myocardial infarction) ([place])   . Unstable angina ([place])   . Chest pain with high risk of acute coronary syndrome 08/01/2015  . CKD (chronic kidney disease) stage 3, GFR 30-59 ml/min (HCC) 06/14/2015  . Angina pectoris ([place]) 02/17/2015  . Paroxysmal atrial fibrillation ([place]) 01/11/2015  . Hypersomnia 04/09/2013  . Chronic systolic CHF (congestive heart failure) ([place]) 12/12/2012  . 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 Chronic cholecystitis with calculus 01/02/2012  . Nausea vomiting and diarrhea 12/24/2011  . Dizziness 12/24/2011  . Preop cardiovascular exam 12/24/2011  . CAD (coronary artery disease) 02/14/2011  . HTN (hypertension) 02/14/2011  . DM  (diabetes mellitus) ([place]) 02/14/2011  . Ischemic cardiomyopathy 10/14/2010  . Hyperlipemia 10/14/2010    Orientation RESPIRATION BLADDER Height & Weight     Self,Time,Situation,Place  Normal Continent Weight: 178 lb 5.6 oz (80.9 kg) Height:  5\' 9"  (175.3 cm)  BEHAVIORAL SYMPTOMS/MOOD NEUROLOGICAL BOWEL NUTRITION STATUS      Continent Diet (See DC Summary)  AMBULATORY STATUS COMMUNICATION OF NEEDS Skin   Limited Assist Verbally Normal                       Personal Care Assistance Level of Assistance  Bathing,Dressing,Feeding Bathing Assistance: Maximum assistance Feeding assistance: Independent Dressing Assistance: Maximum assistance     Functional Limitations Info  Sight,Hearing,Speech Sight Info: Adequate Hearing Info: Adequate Speech Info: Adequate    SPECIAL CARE FACTORS FREQUENCY  PT (By licensed PT),OT (By licensed OT)     PT Frequency: 5x a week OT Frequency: 5x a week            Contractures Contractures Info: Not present    Additional Factors Info  Code Status,Allergies Code Status Info: Full Allergies Info: Codeine   Metformin Hcl   Other   Trazodone   Adhesive (Tape )           Current Medications (07/26/2020):  This is the current hospital active medication list Current Facility-Administered Medications  Medication Dose Route Frequency Provider Last Rate Last Admin  . 0.9 %  sodium chloride infusion  250 mL Intravenous PRN Allred, James, MD      . 0.9 %  sodium chloride infusion   Intravenous Continuous Larey Dresser, MD 75 mL/hr at 07/26/20 1159  New Bag at 07/26/20 1159  . 0.9 %  sodium chloride infusion  250 mL Intravenous PRN Larey Dresser, MD      . acetaminophen (TYLENOL) tablet 650 mg  650 mg Oral Q4H PRN Larey Dresser, MD   650 mg at 07/26/20 1211  . allopurinol (ZYLOPRIM) tablet 200 mg  200 mg Oral Daily Thompson Grayer, MD   200 mg at 07/26/20 1202  . amiodarone (PACERONE) tablet 200 mg  200 mg Oral BID Allred, Jeneen Rinks, MD   200  mg at 07/25/20 2113  . apixaban (ELIQUIS) tablet 5 mg  5 mg Oral BID Larey Dresser, MD      . atorvastatin (LIPITOR) tablet 80 mg  80 mg Oral Daily Thompson Grayer, MD   80 mg at 07/26/20 1203  . dapagliflozin propanediol (FARXIGA) tablet 10 mg  10 mg Oral QAC breakfast Thompson Grayer, MD   10 mg at 07/26/20 1204  . hydrALAZINE (APRESOLINE) injection 10 mg  10 mg Intravenous Q20 Min PRN Larey Dresser, MD      . insulin aspart (novoLOG) injection 0-15 Units  0-15 Units Subcutaneous TID WC Thompson Grayer, MD   3 Units at 07/25/20 1708  . insulin aspart (novoLOG) injection 0-5 Units  0-5 Units Subcutaneous QHS Allred, James, MD      . insulin aspart (novoLOG) injection 4 Units  4 Units Subcutaneous TID WC Thompson Grayer, MD   4 Units at 07/25/20 [phone]  . labetalol (NORMODYNE) injection 10 mg  10 mg Intravenous Q10 min PRN Larey Dresser, MD      . metoprolol succinate (TOPROL-XL) 24 hr tablet 12.5 mg  12.5 mg Oral Daily Allred, Jeneen Rinks, MD   12.5 mg at 07/25/20 0915  . ondansetron (ZOFRAN) injection 4 mg  4 mg Intravenous Q6H PRN Larey Dresser, MD      . QUEtiapine (SEROQUEL) tablet 100 mg  100 mg Oral QHS Thompson Grayer, MD   100 mg at 07/25/20 2113  . ranolazine (RANEXA) 12 hr tablet 500 mg  500 mg Oral BID Allred, Jeneen Rinks, MD   500 mg at 07/26/20 1159  . sodium chloride flush (NS) 0.9 % injection 3 mL  3 mL Intravenous Q12H Thompson Grayer, MD   3 mL at 07/25/20 2115  . sodium chloride flush (NS) 0.9 % injection 3 mL  3 mL Intravenous PRN Allred, James, MD      . sodium chloride flush (NS) 0.9 % injection 3 mL  3 mL Intravenous Q12H Larey Dresser, MD      . sodium chloride flush (NS) 0.9 % injection 3 mL  3 mL Intravenous PRN Larey Dresser, MD      . spironolactone (ALDACTONE) tablet 12.5 mg  12.5 mg Oral Daily Larey Dresser, MD   12.5 mg at 07/26/20 1201  . venlafaxine XR (EFFEXOR-XR) 24 hr capsule 150 mg  150 mg Oral Q breakfast Allred, Jeneen Rinks, MD   150 mg at 07/26/20 0740     Discharge  Medications: Please see discharge summary for a list of discharge medications.  Relevant Imaging Results:  Relevant Lab Results:   Additional Information SSN [id number]  Reece Agar, [place]

## 2020-07-26 NOTE — Progress Notes (Signed)
OT Cancellation Note  Patient Details Name: EUAL LINDSTROM MRN: [id number] DOB: July 02, 1940   Cancelled Treatment:    Reason Eval/Treat Not Completed: Patient at procedure or test/ unavailable (Pt in cath lab.)  Malka So 07/26/2020, 9:32 AM  Nestor Lewandowsky, OTR/L Acute Rehabilitation Services Pager: [phone] Office: [phone]

## 2020-07-26 NOTE — Interval H&P Note (Signed)
History and Physical Interval Note:  07/26/2020 9:15 AM  [name]  has presented today for surgery, with the diagnosis of NSTEMI.  The various methods of treatment have been discussed with the patient and family. After consideration of risks, benefits and other options for treatment, the patient has consented to  Procedure(s): RIGHT/LEFT HEART CATH AND CORONARY/GRAFT ANGIOGRAPHY (N/A) as a surgical intervention.  The patient's history has been reviewed, patient examined, no change in status, stable for surgery.  I have reviewed the patient's chart and labs.  Questions were answered to the patient's satisfaction.     [name]

## 2020-07-26 NOTE — Progress Notes (Signed)
Patient ID: [name], male   DOB: 09/04/1940, 80 y.o.   MRN: [id number]     Advanced Heart Failure Rounding Note  PCP-Cardiologist: No primary care provider on file.   Subjective:    No complaints today.  No further VT.  SBP low, 80s-100s range but he denies lightheadedness.  Lasix is on hold.  No dyspnea.  Creatinine 1.8.   RHC/LHC done today: 1. Optimized filling pressures and preserved cardiac output.  2. All SVGs are occluded (not injected, known from prior cath.  3. Proximal RCA and proximal LAD are occluded.  4. Patent LIMA-LAD with collaterals to RCA territory.  5. The left main stent is patent and there is good flow into the LCx with collaterals from LCx to RCA territory.     Objective:   Weight Range: 80.9 kg Body mass index is 26.34 kg/m.   Vital Signs:   Temp:  [97.8 F (36.6 C)-99.4 F (37.4 C)] 98.4 F (36.9 C) (04/11 0741) Pulse Rate:  [68-85] 81 (04/11 1031) Resp:  [12-24] 15 (04/11 1031) BP: (84-119)/(32-67) 86/32 (04/11 1031) SpO2:  [91 %-98 %] 97 % (04/11 1031) Weight:  [80.9 kg] 80.9 kg (04/11 0443)    Weight change: Filed Weights   07/24/20 0200 07/25/20 0300 07/26/20 0443  Weight: 79.1 kg 80.2 kg 80.9 kg    Intake/Output:   Intake/Output Summary (Last 24 hours) at 07/26/2020 1057 Last data filed at 07/26/2020 0645 Gross per 24 hour  Intake 300 ml  Output 700 ml  Net -400 ml      Physical Exam    General:  Well appearing. No resp difficulty HEENT: Normal Neck: Supple. JVP not elevated. Carotids 2+ bilat; no bruits. No lymphadenopathy or thyromegaly appreciated. Cor: PMI nondisplaced. Regular rate & rhythm. No rubs, gallops or murmurs. Lungs: Clear Abdomen: Soft, nontender, nondistended. No hepatosplenomegaly. No bruits or masses. Good bowel sounds. Extremities: No cyanosis, clubbing, rash, edema Neuro: Alert & orientedx3, cranial nerves grossly intact. moves all 4 extremities w/o difficulty. Affect pleasant   Telemetry   NSR  with BiV pacing (personally reviewed)   Labs    CBC Recent Labs    07/23/20 2034 07/26/20 0708  WBC 8.5 9.5  NEUTROABS 6.4  --   HGB 13.7 13.0  HCT 42.7 39.1  MCV 98.2 96.3  PLT 258 009   Basic Metabolic Panel Recent Labs    07/23/20 2034 07/24/20 0414 07/25/20 1048 07/26/20 0708  NA 136   < > 134* 136  K 3.7   < > 3.6 3.7  CL 100   < > 102 101  CO2 26   < > 24 24  GLUCOSE 209*   < > 161* 91  BUN 23   < > 25* 31*  CREATININE 1.65*   < > 1.70* 1.82*  CALCIUM 8.4*   < > 8.3* 8.9  MG 2.1  --   --   --    < > = values in this interval not displayed.   Liver Function Tests No results for input(s): AST, ALT, ALKPHOS, BILITOT, PROT, ALBUMIN in the last 72 hours. No results for input(s): LIPASE, AMYLASE in the last 72 hours. Cardiac Enzymes No results for input(s): CKTOTAL, CKMB, CKMBINDEX, TROPONINI in the last 72 hours.  BNP: BNP (last 3 results) Recent Labs    06/01/20 0924 06/29/20 1407 07/24/20 0414  BNP 128.4* 160.6* 163.7*    ProBNP (last 3 results) No results for input(s): PROBNP in the last 8760 hours.  D-Dimer No results for input(s): DDIMER in the last 72 hours. Hemoglobin A1C No results for input(s): HGBA1C in the last 72 hours. Fasting Lipid Panel No results for input(s): CHOL, HDL, LDLCALC, TRIG, CHOLHDL, LDLDIRECT in the last 72 hours. Thyroid Function Tests Recent Labs    07/24/20 0414  TSH 1.028    Other results:   Imaging    CARDIAC CATHETERIZATION  Result Date: 07/26/2020  Ost LAD to Prox LAD lesion is 100% stenosed.  Ost Cx to Prox Cx lesion is 20% stenosed.  Ost RCA to Dist RCA lesion is 100% stenosed.  LIMA and is normal in caliber.  LM lesion is 20% stenosed.  1. Optimized filling pressures and preserved cardiac output. 2. All SVGs are occluded (not injected, known from prior cath. 3. Proximal RCA and proximal LAD are occluded. 4. Patent LIMA-LAD with collaterals to RCA territory. 5. The left main stent is patent and  there is good flow into the LCx with collaterals from LCx to RCA territory.      Medications:     Scheduled Medications: . [MAR Hold] allopurinol  200 mg Oral Daily  . [MAR Hold] amiodarone  200 mg Oral BID  . apixaban  5 mg Oral BID  . [MAR Hold] atorvastatin  80 mg Oral Daily  . [MAR Hold] dapagliflozin propanediol  10 mg Oral QAC breakfast  . [MAR Hold] insulin aspart  0-15 Units Subcutaneous TID WC  . [MAR Hold] insulin aspart  0-5 Units Subcutaneous QHS  . [MAR Hold] insulin aspart  4 Units Subcutaneous TID WC  . [MAR Hold] metoprolol succinate  12.5 mg Oral Daily  . [MAR Hold] QUEtiapine  100 mg Oral QHS  . [MAR Hold] ranolazine  500 mg Oral BID  . [MAR Hold] sodium chloride flush  3 mL Intravenous Q12H  . sodium chloride flush  3 mL Intravenous Q12H  . [MAR Hold] spironolactone  12.5 mg Oral Daily  . [MAR Hold] venlafaxine XR  150 mg Oral Q breakfast     Infusions: . [MAR Hold] sodium chloride    . sodium chloride    . sodium chloride 75 mL/hr at 07/26/20 0736     PRN Medications:  [MAR Hold] sodium chloride, sodium chloride, [MAR Hold] acetaminophen, [MAR Hold] ondansetron (ZOFRAN) IV, [MAR Hold] sodium chloride flush, sodium chloride flush   Assessment/Plan   1. VT: Suspect this was scar-mediated.  LHC done today, coronary/graft anatomy is unchanged.  Cardiac output is preserved and filling pressures optimized.  - Amiodarone 200 mg bid, decrease to 200 mg daily at discharge.  - no driving x 6 months.  2. Chronic systolic CHF: Ischemic cardiomyopathy.  Echo in 6/20 showed EF 25-30%. He has a Medtronic CRT-D device. CPX in 11/20 showed moderate functional limitation due to CHF.  TEE in 6/21 with EF 25-30%, normal RV.  He is not volume overloaded, in fact Lasix has been held.  On RHC today, filling pressures optimized and good cardiac output. BP has been soft.  - I will have him stop losartan for now.  - He can resume eplerenone 25 mg qhs when he goes home.  -  Continue Farxiga 10 mg daily.  - Restart Lasix 80 mg daily tomorrow morning (lower dose).  - Continue Toprol XL 12.5 mg qhs.   - Encouraged routine use of TED hoses and abdominal binder to reduce orthostatic hypotension to allow continuation of HF meds  3. CAD: s/p CABG and redo. Had DES to distal LM, angioplasty to  ostial LCx in 4/17. Cardiolite 11/17 with scar, no ischemia.  Coronary angiography today showed no change, patent LIMA-LAD and patent left main stent with good flow to LCx.  SVGs, native RCA, and native LAD chronically occluded.  - Continue statin.  - He is off ASA/Plavix and taking apixaban 5 mg bid.  - Continue ranolazine 500 mg bid.  4. CKD stage IIIa: Creatinine 1.8, he only got 30 cc contrast with angiography and is getting gentle IVF post-procedure.  5. Hyperlipidemia: Continue statin.  6. Atrial fibrillation: Paroxysmal atrial fibrillation. NSR today.  - Restart apixaban tonight.  7. Disposition: Think he can go home today.  Followup with me and with EP.  Meds for discharge: amiodarone 200 mg daily, eplerenone 25 mg qhs, Toprol XL 12.5 mg qhs, Farxiga 10 mg daily, atorvastatin 80 mg daily, apixaban 5 mg bid, Lasix 80 mg daily (can start tomorrow), ranolazine 500 mg bid.  Needs BMET in 1 week.   Length of Stay: 2  [name], MD  07/26/2020, 10:57 AM  Advanced Heart Failure Team Pager [phone] (M-F; 7a - 5p)  Please contact [facility] for night-coverage after hours (5p -7a ) and weekends on [email or web address]

## 2020-07-26 NOTE — Progress Notes (Signed)
Telemetry reviewed SR/ Vpaced No VT D/w Dr. Rayann Heman, continue amiodarone Will make EP follow up.  Planned for cath today C/w AHF team   Tommye Standard, PA-C

## 2020-07-26 NOTE — Discharge Summary (Addendum)
Advanced Heart Failure Team  Discharge Summary   Patient ID: [name] MRN: [id number], DOB/AGE: [birth year] 80 y.o. Admit date: 07/23/2020 D/C date:     07/27/2020   Primary Discharge Diagnoses:  1. VT: Suspect this was scar-mediated.  2. Chronic systolic CHF: Ischemic cardiomyopathy.  3. CAD: s/p CABG and redo. Had DES to distal LM, angioplasty to ostial LCx in 4/17. Cardiolite 11/17 with scar, no ischemia. Coronary angiography today showed no change, patent LIMA-LAD and patent left main stent with good flow to LCx.  SVGs, native RCA, and native LAD chronically occluded.  4. CKD stage IIIa 5. Hyperlipidemia 6. Atrial fibrillation: Paroxysmal atrial fibrillation.    Hospital Course:  [name] is a 80 y.o. with history of CAD s/p CABG and redo CABG as well as ischemic cardiomyopathy with CRT-D device presents for followup of CHF and CAD.  He had a Cardiolite in the 4/15 showing lateral wall ischemia.  LHC at that time showed all his vein grafts from both CABG surgeries occluded.  The LIMA-LAD was patent and there was a 90% distal LM stenosis.  The lateral ischemia likely corresponded to LCx territory downstream from the LM stenosis.  PCI of the distal LM was thought to be high risk and characteristics of the lesion were not favorable for PCI.  Last echo showed EF 25-30% with moderate RV systolic dysfunction.   Admitted with ICD shocks. ICD showed VT. EP consulted. Started on amiodarone. Had cath with stable coronary disease. Suspect VT in the setting of scar. Had hyptension post cath and was given IV fluids with improvement.   D/C home today with his wife. Dr Aundra Dubin discussed cath results with his wife.     1. VT: Suspect this was scar-mediated.  LHC done today, coronary/graft anatomy is unchanged.  Cardiac output is preserved and filling pressures optimized.  - Amiodarone 200 mg bid, decrease to 200 mg daily at discharge.  - no driving x 6 months.  2. Chronic systolic CHF: Ischemic  cardiomyopathy. Echo in 6/20 showed EF 25-30%. He has a Medtronic CRT-D device. CPX in 11/20 showed moderate functional limitation due to CHF. TEE in 6/21 with EF 25-30%, normal RV.  He is not volume overloaded, in fact Lasix has been held.  On RHC today, filling pressures optimized and good cardiac output. BP has been soft.  -Off losartan for now.  - He can resume eplerenone 25 mg qhs when he goes home.  - Continue Farxiga 10 mg daily.  - Restart Lasix 80 mg daily tomorrow morning (lower dose).  -Continue Toprol XL 12.5 mg qhs. - Encouraged routine use of TED hoses and abdominal binder to reduce orthostatic hypotension to allow continuation of HF meds 3. CAD: s/p CABG and redo. Had DES to distal LM, angioplasty to ostial LCx in 4/17. Cardiolite 11/17 with scar, no ischemia. Coronary angiography today showed no change, patent LIMA-LAD and patent left main stent with good flow to LCx.  SVGs, native RCA, and native LAD chronically occluded.  - Continue statin.  - He is off ASA/Plavix and taking apixaban 5 mg bid.  - Continue ranolazine 500 mg bid. 4. CKD stage IIIa: Creatinine 1.8, he only got 30 cc contrast with angiography and is getting gentle IVF post-procedure.  5. Hyperlipidemia: Continue statin.  6. Atrial fibrillation: Paroxysmal atrial fibrillation. NSR today.  - Restart apixaban tonight.   7. Disposition: T  Followup with me and with EP.  Meds for discharge: amiodarone 200 mg daily, eplerenone 25 mg  qhs, Toprol XL 12.5 mg qhs, Farxiga 10 mg daily, atorvastatin 80 mg daily, apixaban 5 mg bid, Lasix 80 mg daily (can start tomorrow), ranolazine 500 mg bid.  Needs BMET in 1 week.    Discharge Vitals: Blood pressure (!) 127/57, pulse 80, temperature 98 F (36.7 C), temperature source Oral, resp. rate 16, height 5\' 9"  (1.753 m), weight 80.9 kg, SpO2 96 %.  Labs: Lab Results  Component Value Date   WBC 8.2 07/27/2020   HGB 12.3 (L) 07/27/2020   HCT 37.8 (L) 07/27/2020   MCV 95.7  07/27/2020   PLT 208 07/27/2020    Recent Labs  Lab 07/27/20 0756  NA 137  K 3.1*  CL 102  CO2 25  BUN 29*  CREATININE 1.58*  CALCIUM 8.7*  GLUCOSE 164*   Lab Results  Component Value Date   CHOL 102 06/29/2020   HDL 27 (L) 06/29/2020   LDLCALC 45 06/29/2020   TRIG 149 06/29/2020   BNP (last 3 results) Recent Labs    06/01/20 0924 06/29/20 1407 07/24/20 0414  BNP 128.4* 160.6* 163.7*    ProBNP (last 3 results) No results for input(s): PROBNP in the last 8760 hours.   Diagnostic Studies/Procedures   CARDIAC CATHETERIZATION  Result Date: 07/26/2020  Ost LAD to Prox LAD lesion is 100% stenosed.  Ost Cx to Prox Cx lesion is 20% stenosed.  Ost RCA to Dist RCA lesion is 100% stenosed.  LIMA and is normal in caliber.  LM lesion is 20% stenosed.  1. Optimized filling pressures and preserved cardiac output. 2. All SVGs are occluded (not injected, known from prior cath. 3. Proximal RCA and proximal LAD are occluded. 4. Patent LIMA-LAD with collaterals to RCA territory. 5. The left main stent is patent and there is good flow into the LCx with collaterals from LCx to RCA territory.    Discharge Medications   Allergies as of 07/27/2020      Reactions   Codeine Nausea And Vomiting   Metformin Hcl Other (See Comments)   Other Other (See Comments)   Trazodone Other (See Comments)   Adhesive [tape] Rash      Medication List    STOP taking these medications   losartan 25 MG tablet Commonly known as: COZAAR     TAKE these medications   acetaminophen 500 MG tablet Commonly known as: TYLENOL Take 1,000 mg by mouth every 6 (six) hours as needed for moderate pain.   allopurinol 100 MG tablet Commonly known as: ZYLOPRIM Take 200 mg by mouth daily.   amiodarone 200 MG tablet Commonly known as: PACERONE Take 1 tablet (200 mg total) by mouth daily.   atorvastatin 80 MG tablet Commonly known as: LIPITOR TAKE 1 TABLET BY MOUTH ONCE DAILY AT  6PM  IN  THE  EVENING    dapagliflozin propanediol 10 MG Tabs tablet Commonly known as: Farxiga Take 1 tablet (10 mg total) by mouth daily before breakfast.   Eliquis 5 MG Tabs tablet Generic drug: apixaban Take 1 tablet by mouth twice daily   eplerenone 25 MG tablet Commonly known as: INSPRA Take 1 tablet (25 mg total) by mouth at bedtime.   FreeStyle Libre 14 Day Sensor Misc USE TO CHECK GLUCOSE THREE TIMES DAILY AS DIRECTED   furosemide 40 MG tablet Commonly known as: LASIX Take 2 tablets (80 mg total) by mouth daily. Start taking on: July 28, 2020 What changed: See the new instructions.   loperamide 2 MG tablet Commonly known as: IMODIUM  A-D Take 2 mg by mouth 4 (four) times daily as needed for diarrhea or loose stools.   metoprolol succinate 25 MG 24 hr tablet Commonly known as: Toprol XL Take 0.5 tablets (12.5 mg total) by mouth at bedtime. What changed: when to take this   nitroGLYCERIN 0.4 MG SL tablet Commonly known as: NITROSTAT Place 1 tablet (0.4 mg total) under the tongue every 5 (five) minutes as needed for chest pain. DO NOT EXCEED A TOTAL OF 3 DOSES IN 15 MINUTES   NovoLIN 70/30 FlexPen (70-30) 100 UNIT/ML KwikPen Generic drug: insulin isophane & regular human Inject 40 Units into the skin daily before lunch.   potassium chloride SA 20 MEQ tablet Commonly known as: KLOR-CON Take 2 tablets (40 mEq total) by mouth daily.   QUEtiapine 100 MG tablet Commonly known as: SEROQUEL Take 100 mg by mouth at bedtime. Taking 0.5 tablet at bedtime   ranolazine 500 MG 12 hr tablet Commonly known as: RANEXA Take 500 mg by mouth 2 (two) times daily.   SIMILASAN ALLERGY EYE RELIEF OP Place 1 drop into both eyes daily as needed (irritation).   triamcinolone cream 0.1 % Commonly known as: KENALOG Apply 1 application topically 2 (two) times daily as needed (for dry/irritated skin.).   venlafaxine XR 150 MG 24 hr capsule Commonly known as: EFFEXOR-XR Take 150 mg by mouth daily with  breakfast.   vitamin B-12 1000 MCG tablet Commonly known as: CYANOCOBALAMIN Take 1,000 mcg by mouth daily.       Disposition   The patient will be discharged in stable condition to home. Discharge Instructions    (HEART FAILURE PATIENTS) Call MD:  Anytime you have any of the following symptoms: 1) 3 pound weight gain in 24 hours or 5 pounds in 1 week 2) shortness of breath, with or without a dry hacking cough 3) swelling in the hands, feet or stomach 4) if you have to sleep on extra pillows at night in order to breathe.   Complete by: As directed    (HEART FAILURE PATIENTS) Call MD:  Anytime you have any of the following symptoms: 1) 3 pound weight gain in 24 hours or 5 pounds in 1 week 2) shortness of breath, with or without a dry hacking cough 3) swelling in the hands, feet or stomach 4) if you have to sleep on extra pillows at night in order to breathe.   Complete by: As directed    Diet - low sodium heart healthy   Complete by: As directed    Heart Failure patients record your daily weight using the same scale at the same time of day   Complete by: As directed    Increase activity slowly   Complete by: As directed    Increase activity slowly   Complete by: As directed       Follow-up Information    [facility] Follow up on 08/10/2020.   Specialty: Cardiology Why: at 8:40 with Dr Aundra Dubin. Garage Code [id number].  Contact information: [address] [id number] [place]       Thompson Grayer, MD Follow up.   Specialty: Cardiology Why: 08/27/20 @ 8:45AM Contact information: [place] [address] [phone]                 Duration of Discharge Encounter: Greater than 35 minutes   Signed, Caden Fatica NP-C  07/27/2020, 10:04 AM

## 2020-07-26 NOTE — Progress Notes (Signed)
  Called by nursing staff regarding hypotension.   SBP 80-90s. Small amount of bleeding under TR band.   Will given 250 cc NS now. Discussed with his wife she is concerned about discharge.   She would like to pursue short term SNF.   I have reached out to SW.   Azaria Stegman NP-C  4:10 PM

## 2020-07-26 NOTE — Progress Notes (Signed)
Patient's blood pressure has been running on the low side. Discharge home orders were written around 1300 today. Patient went to cath lab this a.m. for right and left heart cath with return to the floor at 11 a.m.  At 0700 when receiving the patient blood pressures were 100s-110s/40-502. Patient's metoprolol was held this a.m., and he did not get am Pacerone, as he was in cath lab during this time. Soon after coming back to the floor from cath lab, blood pressures began to drop again, between 23-30 systolic/40s.   During this time, PT had rounded several times, trying assess patient per his readiness to go home and ability to be able to get into his private vehicle. However, due to blood pressure and TR band, level 1, they were unable to assess at the time after several attempts.  Page sent to PA Amy C. She came to bedside. Bolus ordered, and discharge will be prolonged until tomorrow. Medication adjustments were also made, per orders.  Wife is wondering again, if STR prior to home would be the best plan.

## 2020-07-26 NOTE — Progress Notes (Signed)
OT Cancellation Note  Patient Details Name: [name] MRN: [id number] DOB: March 28, 1941   Cancelled Treatment:    Reason Eval/Treat Not Completed:  R wrist cath site continues to ooze, pt with soft BP. RN aware and contacting MD. OT will continue to follow to assess safety for return home.   Malka So 07/26/2020, 3:36 PM  Nestor Lewandowsky, OTR/L Acute Rehabilitation Services Pager: [phone] Office: [phone]

## 2020-07-26 NOTE — Progress Notes (Signed)
PT Cancellation Note  Patient Details Name: [name] MRN: [id number] DOB: 03/05/1941   Cancelled Treatment:    Reason Eval/Treat Not Completed: Medical issues which prohibited therapy. PT attempted to see patient 3 times today for treatment. Pt remained with TR band in place and unable to mobilize after cath. PT also notes hypotension upon last attempt with BP of 87/40 and MAP in 50s. PT will attempt to follow up as time allows and once pt is able to mobilize after band removal.   Zenaida Niece 07/26/2020, 3:38 PM

## 2020-07-26 NOTE — TOC Initial Note (Signed)
Transition of Care [facility]) - Initial/Assessment Note    Patient Details  Name: [name] MRN: [id number] Date of Birth: [birth year]  Transition of Care [facility]) CM/SW Contact:    Tresa Endo Phone Number: 07/26/2020, 2:36 PM  Clinical Narrative:                 2:25pm- CSW offered [email or web address] list to pt wife (in room) she declined list and SNF. Pt wife stated that she was taking her husband home and would not like any services at thins time, she also declined [place]. She said if she feels she need [place] services later she will set it up. CSW signed off on this pt.  Expected Discharge Plan: Skilled Nursing Facility Barriers to Discharge: Continued Medical Work up   Patient Goals and CMS Choice Patient states their goals for this hospitalization and ongoing recovery are:: Rehab CMS Medicare.gov Compare Post Acute Care list provided to:: Patient Choice offered to / list presented to : [facility]  Expected Discharge Plan and Services Expected Discharge Plan: [place] In-house Referral: Clinical Social Work Discharge Planning Services: Other - See comment (CSW) Post Acute Care Choice: [place] arrangements for the past 2 months: Single Family Home Expected Discharge Date: 07/26/20                                    Prior Living Arrangements/Services Living arrangements for the past 2 months: Single Family Home Lives with:: Self,Spouse Patient language and need for interpreter reviewed:: No Do you feel safe going back to the place where you live?: Yes      Need for Family Participation in Patient Care: Yes (Comment) Care giver support system in place?: Yes (comment)   Criminal Activity/Legal Involvement Pertinent to Current Situation/Hospitalization: No - Comment as needed  Activities of Daily Living Home Assistive Devices/Equipment: Eyeglasses ADL Screening (condition at time of admission) Patient's cognitive ability  adequate to safely complete daily activities?: Yes Is the patient deaf or have difficulty hearing?: Yes Does the patient have difficulty seeing, even when wearing glasses/contacts?: No Does the patient have difficulty concentrating, remembering, or making decisions?: Yes Patient able to express need for assistance with ADLs?: Yes Does the patient have difficulty dressing or bathing?: Yes Independently performs ADLs?: No Communication: Independent Dressing (OT): Needs assistance Is this a change from baseline?: Pre-admission baseline Grooming: Needs assistance Is this a change from baseline?: Pre-admission baseline Feeding: Independent Bathing: Dependent Is this a change from baseline?: Pre-admission baseline Toileting: Needs assistance Is this a change from baseline?: Pre-admission baseline In/Out Bed: Needs assistance Is this a change from baseline?: Pre-admission baseline Walks in Home: Independent Does the patient have difficulty walking or climbing stairs?: Yes Weakness of Legs: Both Weakness of Arms/Hands: None  Permission Sought/Granted Permission sought to share information with : Family Supports Permission granted to share information with : Yes, Verbal Permission Granted  Share Information with NAME: Aking Klabunde     Permission granted to share info w Relationship: Wife  Permission granted to share info w Contact Information: [phone]  Emotional Assessment Appearance:: Appears stated age Attitude/Demeanor/Rapport: Unable to Assess Affect (typically observed): Unable to Assess Orientation: : Oriented to Self,Oriented to Place,Oriented to  Time,Oriented to Situation Alcohol / Substance Use: Not Applicable Psych Involvement: No (comment)  Admission diagnosis:  Ventricular fibrillation [facility]) [I49.01] Defibrillator discharge [Z45.02] Patient Active Problem List   Diagnosis Date Noted  .  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 Syncope and collapse 09/24/2017   . GERD (gastroesophageal reflux disease) 09/24/2017  . Type 2 diabetes mellitus ([place]) 09/24/2017  . Chronic combined systolic and diastolic CHF (congestive heart failure) ([place]) 09/24/2017  . Acute renal failure superimposed on stage 3 chronic kidney disease ([place]) 09/24/2017  . Syncope   . Acute kidney injury ([place]) 05/01/2017  . Confusion 05/01/2017  . NSTEMI (non-ST elevated myocardial infarction) ([place])   . Unstable angina ([place])   . Chest pain with high risk of acute coronary syndrome 08/01/2015  . CKD (chronic kidney disease) stage 3, GFR 30-59 ml/min (HCC) 06/14/2015  . Angina pectoris ([place]) 02/17/2015  . Paroxysmal atrial fibrillation ([place]) 01/11/2015  . Hypersomnia 04/09/2013  . Chronic systolic CHF (congestive heart failure) ([place]) 12/12/2012  . 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 Chronic cholecystitis with calculus 01/02/2012  . Nausea vomiting and diarrhea 12/24/2011  . Dizziness 12/24/2011  . Preop cardiovascular exam 12/24/2011  . CAD (coronary artery disease) 02/14/2011  . HTN (hypertension) 02/14/2011  . DM (diabetes mellitus) ([place]) 02/14/2011  . Ischemic cardiomyopathy 10/14/2010  . Hyperlipemia 10/14/2010   PCP:  Mayra Neer, MD Pharmacy:   [place], [place] - [place] [address] [place] [address] [place] [address] Phone: [phone] Fax: [phone]  [facility] [place], [place] [address] Phone: [phone] Fax: [phone]  [place], [address] [place] [address] Phone: [phone] Fax: [phone]     Social Determinants of Health (SDOH) Interventions    Readmission Risk Interventions No flowsheet data found.

## 2020-07-26 NOTE — Plan of Care (Signed)
  Problem: Education: Goal: Knowledge of General Education information will improve Description: Including pain rating scale, medication(s)/side effects and non-pharmacologic comfort measures Outcome: Progressing   Problem: Clinical Measurements: Goal: Ability to maintain clinical measurements within normal limits will improve Outcome: Progressing Goal: Diagnostic test results will improve Outcome: Progressing Goal: Cardiovascular complication will be avoided Outcome: Progressing   Problem: Activity: Goal: Risk for activity intolerance will decrease Outcome: Progressing   Problem: Elimination: Goal: Will not experience complications related to bowel motility Outcome: Progressing Goal: Will not experience complications related to urinary retention Outcome: Progressing   Problem: Pain Managment: Goal: General experience of comfort will improve Outcome: Progressing

## 2020-07-27 ENCOUNTER — Encounter (HOSPITAL_COMMUNITY): Payer: Medicare HMO

## 2020-07-27 DIAGNOSIS — I472 Ventricular tachycardia: Secondary | ICD-10-CM | POA: Diagnosis not present

## 2020-07-27 LAB — CBC
HCT: 37.8 % — ABNORMAL LOW (ref 39.0–52.0)
Hemoglobin: 12.3 g/dL — ABNORMAL LOW (ref 13.0–17.0)
MCH: 31.1 pg (ref 26.0–34.0)
MCHC: 32.5 g/dL (ref 30.0–36.0)
MCV: 95.7 fL (ref 80.0–100.0)
Platelets: 208 10*3/uL (ref 150–400)
RBC: 3.95 MIL/uL — ABNORMAL LOW (ref 4.22–5.81)
RDW: 14.9 % (ref 11.5–15.5)
WBC: 8.2 10*3/uL (ref 4.0–10.5)
nRBC: 0 % (ref 0.0–0.2)

## 2020-07-27 LAB — BASIC METABOLIC PANEL
Anion gap: 10 (ref 5–15)
BUN: 29 mg/dL — ABNORMAL HIGH (ref 8–23)
CO2: 25 mmol/L (ref 22–32)
Calcium: 8.7 mg/dL — ABNORMAL LOW (ref 8.9–10.3)
Chloride: 102 mmol/L (ref 98–111)
Creatinine, Ser: 1.58 mg/dL — ABNORMAL HIGH (ref 0.61–1.24)
GFR, Estimated: 45 mL/min — ABNORMAL LOW (ref >60)
Glucose, Bld: 164 mg/dL — ABNORMAL HIGH (ref 70–99)
Potassium: 3.1 mmol/L — ABNORMAL LOW (ref 3.5–5.1)
Sodium: 137 mmol/L (ref 135–145)

## 2020-07-27 LAB — GLUCOSE, CAPILLARY
Glucose-Capillary: 89 mg/dL (ref 70–99)
Glucose-Capillary: 203 mg/dL — ABNORMAL HIGH (ref 70–99)

## 2020-07-27 MED ORDER — POTASSIUM CHLORIDE CRYS ER 20 MEQ PO TBCR
40.0000 meq | EXTENDED_RELEASE_TABLET | Freq: Every day | ORAL | Status: DC
  Administered 2020-07-27: 40 meq via ORAL
  Filled 2020-07-27: qty 2

## 2020-07-27 MED ORDER — FUROSEMIDE 40 MG PO TABS: 80.0000 mg | ORAL_TABLET | Freq: Every day | ORAL | 3 refills | Status: DC

## 2020-07-27 MED ORDER — POTASSIUM CHLORIDE CRYS ER 20 MEQ PO TBCR: 40.0000 meq | EXTENDED_RELEASE_TABLET | Freq: Every day | ORAL | 6 refills | Status: DC

## 2020-07-27 MED ORDER — POTASSIUM CHLORIDE CRYS ER 20 MEQ PO TBCR: 40.0000 meq | EXTENDED_RELEASE_TABLET | Freq: Once | ORAL | Status: DC

## 2020-07-27 NOTE — Care Management Important Message (Signed)
Important Message  Patient Details  Name: [name] MRN: [id number] Date of Birth: January 18, 1941   Medicare Important Message Given:  Yes     Orbie Pyo 07/27/2020, 2:28 PM

## 2020-07-27 NOTE — Progress Notes (Addendum)
Patient ID: [name], male   DOB: April 16, 1941, 80 y.o.   MRN: [id number]     Advanced Heart Failure Rounding Note  PCP-Cardiologist: No primary care provider on file.   Subjective:    No further VT.   RHC/LHC done yesterday: 1. Optimized filling pressures and preserved cardiac output.  2. All SVGs are occluded (not injected, known from prior cath).  3. Proximal RCA and proximal LAD are occluded.  4. Patent LIMA-LAD with collaterals to RCA territory.  5. The left main stent is patent and there is good flow into the LCx with collaterals from LCx to RCA territory.   Became hypotensive post cath. No bleeding.  Discharge postponed. 250 cc bolus IVFs given. No labs drawn this morning.   BP better this morning, 409W systolic. Wt up 2 lb. No dyspnea.    Objective:   Weight Range: 80.9 kg Body mass index is 26.34 kg/m.   Vital Signs:   Temp:  [97.8 F (36.6 C)-98.6 F (37 C)] 97.9 F (36.6 C) (04/12 0506) Pulse Rate:  [61-85] 61 (04/12 0506) Resp:  [11-24] 13 (04/12 0506) BP: (84-120)/(32-67) 120/58 (04/12 0506) SpO2:  [91 %-98 %] 96 % (04/12 0506) Last BM Date: 07/26/20  Weight change: Filed Weights   07/24/20 0200 07/25/20 0300 07/26/20 0443  Weight: 79.1 kg 80.2 kg 80.9 kg    Intake/Output:   Intake/Output Summary (Last 24 hours) at 07/27/2020 0729 Last data filed at 07/27/2020 0600 Gross per 24 hour  Intake 480 ml  Output --  Net 480 ml      Physical Exam    General:  Well appearing, laying in bed. No resp difficulty HEENT: Normal Neck: Supple. JVP 9 cm. Carotids 2+ bilat; no bruits. No lymphadenopathy or thyromegaly appreciated. Cor: PMI nondisplaced. Regular rate & rhythm. No rubs, gallops or murmurs. Lungs: Clear Abdomen: Soft, nontender, nondistended. No hepatosplenomegaly. No bruits or masses. Good bowel sounds. Extremities: No cyanosis, clubbing, rash, edema Neuro: Alert & orientedx3, cranial nerves grossly intact. moves all 4 extremities w/o  difficulty. Affect pleasant   Telemetry   NSR with BiV pacing (personally reviewed)   Labs    CBC Recent Labs    07/26/20 0708 07/26/20 1007  WBC 9.5  --   HGB 13.0 11.9*  11.9*  HCT 39.1 35.0*  35.0*  MCV 96.3  --   PLT 197  --    Basic Metabolic Panel Recent Labs    07/25/20 1048 07/26/20 0708 07/26/20 1007  NA 134* 136 138  138  K 3.6 3.7 3.3*  3.3*  CL 102 101  --   CO2 24 24  --   GLUCOSE 161* 91  --   BUN 25* 31*  --   CREATININE 1.70* 1.82*  --   CALCIUM 8.3* 8.9  --    Liver Function Tests No results for input(s): AST, ALT, ALKPHOS, BILITOT, PROT, ALBUMIN in the last 72 hours. No results for input(s): LIPASE, AMYLASE in the last 72 hours. Cardiac Enzymes No results for input(s): CKTOTAL, CKMB, CKMBINDEX, TROPONINI in the last 72 hours.  BNP: BNP (last 3 results) Recent Labs    06/01/20 0924 06/29/20 1407 07/24/20 0414  BNP 128.4* 160.6* 163.7*    ProBNP (last 3 results) No results for input(s): PROBNP in the last 8760 hours.   D-Dimer No results for input(s): DDIMER in the last 72 hours. Hemoglobin A1C No results for input(s): HGBA1C in the last 72 hours. Fasting Lipid Panel No results for input(s):  CHOL, HDL, LDLCALC, TRIG, CHOLHDL, LDLDIRECT in the last 72 hours. Thyroid Function Tests No results for input(s): TSH, T4TOTAL, T3FREE, THYROIDAB in the last 72 hours.  Invalid input(s): FREET3  Other results:   Imaging    CARDIAC CATHETERIZATION  Result Date: 07/26/2020  Ost LAD to Prox LAD lesion is 100% stenosed.  Ost Cx to Prox Cx lesion is 20% stenosed.  Ost RCA to Dist RCA lesion is 100% stenosed.  LIMA and is normal in caliber.  LM lesion is 20% stenosed.  1. Optimized filling pressures and preserved cardiac output. 2. All SVGs are occluded (not injected, known from prior cath. 3. Proximal RCA and proximal LAD are occluded. 4. Patent LIMA-LAD with collaterals to RCA territory. 5. The left main stent is patent and there is  good flow into the LCx with collaterals from LCx to RCA territory.     Medications:     Scheduled Medications: . allopurinol  200 mg Oral Daily  . amiodarone  200 mg Oral BID  . apixaban  5 mg Oral BID  . atorvastatin  80 mg Oral Daily  . dapagliflozin propanediol  10 mg Oral QAC breakfast  . insulin aspart  0-15 Units Subcutaneous TID WC  . insulin aspart  0-5 Units Subcutaneous QHS  . insulin aspart  4 Units Subcutaneous TID WC  . metoprolol succinate  12.5 mg Oral Daily  . QUEtiapine  100 mg Oral QHS  . ranolazine  500 mg Oral BID  . sodium chloride flush  3 mL Intravenous Q12H  . sodium chloride flush  3 mL Intravenous Q12H  . spironolactone  12.5 mg Oral Daily  . venlafaxine XR  150 mg Oral Q breakfast    Infusions: . sodium chloride    . sodium chloride      PRN Medications: sodium chloride, sodium chloride, acetaminophen, loperamide, ondansetron (ZOFRAN) IV, sodium chloride flush, sodium chloride flush   Assessment/Plan   1. VT: Suspect this was scar-mediated.  LHC done yesterday, coronary/graft anatomy is unchanged.  Cardiac output is preserved and filling pressures optimized.  - Amiodarone 200 mg bid, decrease to 200 mg daily at discharge.  - no driving x 6 months.  2. Chronic systolic CHF: Ischemic cardiomyopathy.  Echo in 6/20 showed EF 25-30%. He has a [employer]onic CRT-D device. CPX in 11/20 showed moderate functional limitation due to CHF.  TEE in 6/21 with EF 25-30%, normal RV. On RHC, filling pressures were optimized and good cardiac output. BP has been soft. Improved today w/ fluid bolus.  - hold losartan for now.  - He can resume eplerenone 25 mg qhs when he goes home.  - Continue Farxiga 10 mg daily.  - Restart Lasix 80 mg daily. BMP pending - Continue Toprol XL 12.5 mg qhs.   - Encouraged routine use of TED hoses and abdominal binder to reduce orthostatic hypotension to allow continuation of HF meds  3. CAD: s/p CABG and redo. Had DES to distal LM,  angioplasty to ostial LCx in 4/17. Cardiolite 11/17 with scar, no ischemia.  Coronary angiography showed no change, patent LIMA-LAD and patent left main stent with good flow to LCx.  SVGs, native RCA, and native LAD chronically occluded.  - Continue statin.  - He is off ASA/Plavix and taking apixaban 5 mg bid.  - Continue ranolazine 500 mg bid.  4. CKD stage IIIa: Pre cath Creatinine 1.8, he only got 30 cc contrast with angiography and is getting gentle IVF post-procedure.  - Repeat BMP pending  5. Hyperlipidemia: Continue statin.  6. Atrial fibrillation: Paroxysmal atrial fibrillation. NSR today.  - Apixaban restarted, 5 mg bid  7. Deconditioning: wife thinks he needs more rehab, asking about SNF. Ambulate w/ PT today to determine need for SNF vs HH PT.    Length of Stay: [address] [place] Gene  07/27/2020, 7:29 AM  Advanced Heart Failure Team Pager [phone] (M-F; 7a - 5p)  Please contact [facility] for night-coverage after hours (5p -7a ) and weekends on [email or web address]  Patient seen with PA, agree with the above note.   Discharge delayed yesterday due to weakness/low BP, ?need for SNF.  This morning, patient walked with PT and wife thinks she can take him home.  SBP around 120.    Up in chair eating breakfast.   General: NAD Neck: No JVD, no thyromegaly or thyroid nodule.  Lungs: Clear to auscultation bilaterally with normal respiratory effort. CV: Nondisplaced PMI.  Heart regular S1/S2, no S3/S4, no murmur.  No peripheral edema.   Abdomen: Soft, nontender, no hepatosplenomegaly, no distention.  Skin: Intact without lesions or rashes.  Neurologic: Alert and oriented x 3.  Psych: Normal affect. Extremities: No clubbing or cyanosis.  HEENT: Normal.   If labs stable, can go home today.   Followup with me and with EP.  Meds for discharge: amiodarone 200 mg daily, eplerenone 25 mg qhs, Toprol XL 12.5 mg qhs, Farxiga 10 mg daily, atorvastatin 80 mg daily, apixaban 5 mg bid,  Lasix 80 mg daily (can start tomorrow), ranolazine 500 mg bid.  Needs BMET in 1 week.   [name] 07/27/2020 8:48 AM

## 2020-07-27 NOTE — Discharge Instructions (Signed)

## 2020-07-27 NOTE — Evaluation (Signed)
Occupational Therapy Evaluation Patient Details Name: [name] MRN: [id number] DOB: 06/14/1940 Today's Date: 07/27/2020    History of Present Illness 79 y.o. male presents to [facility] ED on 07/23/2020 after becoming unresponsive at dinner, ICD was found to have discharged. Interrogation of ICD found multiple episodes of Vfib.Pt with progressive weakness and hypotension. Radial cardiac cath 4/11. PMH includes ischemic cardiomyopathy s/p MDT CRT-D with HFrEF, 25-30%, two prior CABG most recently 2012 and subsequent LM PCI/LCx in 2017, CKD, pAF.   Clinical Impression   Pt typically independent in ADL and mobility (although leading up wife has assisted with LB dressing). Today Pt is overall min A to min guard (supervision in seated position) for ADL. Able to perform figure 4 to access LB. Pt able to perform multiple sit<>stand from recliner for activity tolerance at min guard level with good carryover from previous PT session. Wife present and very supportive. Pt would like to get back on his tractor/mower as he enjoys gardening/outside work. OT will follow acutely and plan for HHOT to maximize safety and independence in ADL AND IADL in home setting. Next session to focus on energy conservation.     Follow Up Recommendations  Home health OT;Supervision - Intermittent    Equipment Recommendations  None recommended by OT (Pt has appropriate DME)    Recommendations for Other Services       Precautions / Restrictions Precautions Precautions: Fall;Other (comment) Precaution Comments: radial cath- no lifting 1 week, incontinent wears briefs Restrictions Other Position/Activity Restrictions: radial cath- no lifting 1 week, incontinent wears briefs      Mobility Bed Mobility Overal bed mobility: Needs Assistance Bed Mobility: Supine to Sit     Supine to sit: Min assist     General bed mobility comments: OOB in the recliner at beginning and end of session    Transfers Overall transfer  level: Needs assistance Equipment used: None Transfers: Sit to/from Stand Sit to Stand: Min guard         General transfer comment: min guard for safety, good hand placement this session    Balance Overall balance assessment: Independent Sitting-balance support: No upper extremity supported Sitting balance-Leahy Scale: Good Sitting balance - Comments: dynamic seated balance demonstrated during LB ADL in recliner   Standing balance support: Bilateral upper extremity supported Standing balance-Leahy Scale: Fair Standing balance comment: able to static stand from recliner without DME                           ADL either performed or assessed with clinical judgement   ADL Overall ADL's : Needs assistance/impaired Eating/Feeding: Sitting;Modified independent   Grooming: Min guard;Standing Grooming Details (indicate cue type and reason): decreased activity tolerance for standing activity Upper Body Bathing: Min guard;Sitting   Lower Body Bathing: Min guard;Sitting/lateral leans   Upper Body Dressing : Min guard;Sitting   Lower Body Dressing: Min guard;Sit to/from stand Lower Body Dressing Details (indicate cue type and reason): able to don/doff socks Toilet Transfer: Min guard;Minimal assistance;Ambulation   Toileting- Clothing Manipulation and Hygiene: Min guard;Sitting/lateral lean;Sit to/from stand   Tub/ Shower Transfer: Min guard;Ambulation;Grab bars   Functional mobility during ADLs: Min guard;Rolling walker General ADL Comments: decreased activity tolerance, decreased ROM for RUE (limited time post-op)     Vision Baseline Vision/History: Wears glasses Wears Glasses: At all times Patient Visual Report: No change from baseline       Perception     Praxis  Pertinent Vitals/Pain Pain Assessment: No/denies pain Pain Intervention(s): Monitored during session     Hand Dominance Left   Extremity/Trunk Assessment Upper Extremity Assessment Upper  Extremity Assessment: RUE deficits/detail RUE Deficits / Details: limited due to radial approach cath   Lower Extremity Assessment Lower Extremity Assessment: Defer to PT evaluation   Cervical / Trunk Assessment Cervical / Trunk Assessment: Normal   Communication Communication Communication: HOH   Cognition Arousal/Alertness: Awake/alert Behavior During Therapy: Flat affect Overall Cognitive Status: Impaired/Different from baseline Area of Impairment: Memory;Safety/judgement                         Safety/Judgement: Decreased awareness of safety;Decreased awareness of deficits   Problem Solving: Slow processing General Comments: slow processing with inaccurate recall of PLOF with wife present to clarify   General Comments  Wife [name] present throughout session    Exercises General Exercises - Lower Extremity Long Arc Quad: AROM;Both;Seated;15 reps Hip Flexion/Marching: AROM;Both;15 reps;Seated   Shoulder Instructions      Home Living Family/patient expects to be discharged to:: Private residence Living Arrangements: Spouse/significant other Available Help at Discharge: Family;Available 24 hours/day Type of Home: House Home Access: Stairs to enter [employer] of Steps: 1 Entrance Stairs-Rails: None Home Layout: Two level;Laundry or work area in basement     [employer]: Occupational psychologist: Handicapped height Bathroom Accessibility: Yes How Accessible: Accessible via [place]: Environmental consultant - 4 wheels;Cane - single point;Shower seat - built in;Grab bars - toilet;Grab bars - tub/shower;Hand held shower head;Bedside commode          Prior Functioning/Environment Level of Independence: Independent        Comments: Wif has helped leading up to hospitalization, but Pt typically independent, drives, loves to garden and mow grass.        OT Problem List: Decreased activity tolerance;Impaired balance (sitting and/or  standing);Decreased safety awareness;Decreased knowledge of use of DME or AE;Cardiopulmonary status limiting activity;Impaired UE functional use      OT Treatment/Interventions: Self-care/ADL training;Energy conservation;DME and/or AE instruction;Therapeutic activities;Patient/family education;Balance training    OT Goals(Current goals can be found in the care plan section) Acute Rehab OT Goals Patient Stated Goal: to get stronger and go home OT Goal Formulation: With patient/family Time For Goal Achievement: 08/10/20 Potential to Achieve Goals: Good ADL Goals Pt Will Perform Grooming: with modified independence;standing Pt Will Perform Upper Body Dressing: with modified independence;sitting Pt Will Perform Lower Body Dressing: with modified independence;sit to/from stand Pt Will Transfer to Toilet: with modified independence;ambulating Pt Will Perform Toileting - Clothing Manipulation and hygiene: with modified independence;sit to/from stand Additional ADL Goal #1: Pt will verbalize 3 ways of conserving energy during ADL with zero cues  OT Frequency: Min 2X/week   Barriers to D/C:            Co-evaluation              AM-PAC OT "6 Clicks" Daily Activity     Outcome Measure Help from another person eating meals?: None Help from another person taking care of personal grooming?: A Little Help from another person toileting, which includes using toliet, bedpan, or urinal?: A Little Help from another person bathing (including washing, rinsing, drying)?: A Little Help from another person to put on and taking off regular upper body clothing?: A Little Help from another person to put on and taking off regular lower body clothing?: A Little 6 Click Score: 19   End  of Session Equipment Utilized During Treatment: Gait belt;Rolling walker Nurse Communication: Mobility status;Precautions  Activity Tolerance: Patient tolerated treatment well Patient left: in chair;with chair alarm  set;with family/visitor present (wife [place])  OT Visit Diagnosis: Other abnormalities of gait and mobility (R26.89);Muscle weakness (generalized) (M62.81)                Time: 3354-5625 OT Time Calculation (min): 32 min Charges:  OT General Charges $OT Visit: 1 Visit OT Evaluation $OT Eval Moderate Complexity: 1 Mod OT Treatments $Self Care/Home Management : 8-22 mins  Jesse Sans OTR/L Acute Rehabilitation Services Pager: [phone] Office: [place] 07/27/2020, 12:47 PM

## 2020-07-27 NOTE — TOC Transition Note (Addendum)
Transition of Care [facility]) - CM/SW Discharge Note   Patient Details  Name: [name] MRN: [id number] Date of Birth: Jan 09, 1941  Transition of Care [facility]) CM/SW Contact:  Zenon Mayo, RN Phone Number: 07/27/2020, 12:51 PM   Clinical Narrative:    NCM spoke with patient wife , offered choice, she states they do not have a preference of the agency.  NCM made referral to [facility] with [place].  Awaiting call back to see if she can take referral. Per Stacie they are able to take referral for HHPT and will add HHRN if needed.  Soc will begin 24 to 48 hrs post dc.  Final next level of care: [place] Barriers to Discharge: No Barriers Identified   Patient Goals and CMS Choice Patient states their goals for this hospitalization and ongoing recovery are:: home with [facility] CMS Medicare.gov Compare Post Acute Care list provided to:: Patient Represenative (must comment) Choice offered to / list presented to : Spouse  Discharge Placement                       Discharge Plan and Services In-house Referral: Clinical Social Work Discharge Planning Services: Other - See comment (CSW) Post Acute Care Choice: [place]            DME Agency: NA       HH Arranged: PT HH Agency:  Ileene Hutchinson) Date HH Agency Contacted: 07/27/20 Time [place]: [id number] Representative spoke with at [place]: [place] ([place]) Interventions     Readmission Risk Interventions No flowsheet data found.

## 2020-07-27 NOTE — Progress Notes (Signed)
Physical Therapy Treatment Patient Details Name: [name] MRN: [id number] DOB: 1940-07-22 Today's Date: 07/27/2020    History of Present Illness 79 y.o. male presents to [facility] ED on 07/23/2020 after becoming unresponsive at dinner, ICD was found to have discharged. Interrogation of ICD found multiple episodes of Vfib.Pt with progressive weakness and hypotension. Radial cardiac cath 4/11. PMH includes ischemic cardiomyopathy s/p MDT CRT-D with HFrEF, 25-30%, two prior CABG most recently 2012 and subsequent LM PCI/LCx in 2017, CKD, pAF.    PT Comments    Pt pleasant and reports he likes to listen to [place] playing throughout session. PT with significant improvement in mobility able to walk in hall with RW and one assist. Wife present throughout session and reports she feels she can maintain this level of assist at home with the help of HHPT and pt dedication to getting stronger. Pt educated for progression and restrictions. Will continue to follow acutely and encouraged increased mobility OOB to chair and bathroom with nursing staff.   HR 75-93 97% on RA Pre gait 110/55 (73) Post 118/67 (82)    Follow Up Recommendations  Home health PT;Supervision for mobility/OOB     Equipment Recommendations  3in1 (PT)    Recommendations for Other Services       Precautions / Restrictions Precautions Precautions: Fall;Other (comment) Precaution Comments: radial cath- no lifting 1 week, incontinent wears briefs    Mobility  Bed Mobility Overal bed mobility: Needs Assistance Bed Mobility: Supine to Sit     Supine to sit: Min assist     General bed mobility comments: HOB 10 degrees with increased time and pt able to pivot to left side of bed with min assist to fully elevate trunk    Transfers Overall transfer level: Needs assistance   Transfers: Sit to/from Stand Sit to Stand: Min guard         General transfer comment: minguard to stand from bed x 1,  from chair x 6 and from [facility] x 1. cues for hand placement and safety. assist in standing for pericare  Ambulation/Gait Ambulation/Gait assistance: Min guard Gait Distance (Feet): 300 Feet Assistive device: Rolling walker (2 wheeled) Gait Pattern/deviations: Step-through pattern;Trunk flexed   Gait velocity interpretation: 1.31 - 2.62 ft/sec, indicative of limited community ambulator General Gait Details: cues for proximity to RW and direction with pt able to self direct distance. Chair to follow but not needed   Stairs             Wheelchair Mobility    Modified Rankin (Stroke Patients Only)       Balance Overall balance assessment: Needs assistance Sitting-balance support: No upper extremity supported Sitting balance-Leahy Scale: Good Sitting balance - Comments: able to sit EOB and at toilet without UE support   Standing balance support: Bilateral upper extremity supported Standing balance-Leahy Scale: Poor Standing balance comment: bil UE support on RW for gait and standing                            Cognition Arousal/Alertness: Awake/alert Behavior During Therapy: Flat affect Overall Cognitive Status: Impaired/Different from baseline Area of Impairment: Memory;Safety/judgement                         Safety/Judgement: Decreased awareness of safety;Decreased awareness of deficits   Problem Solving: Slow processing General Comments: slow processing with inaccurate recall of PLOF with wife present  to clarify      Exercises General Exercises - Lower Extremity Long Arc Quad: AROM;Both;Seated;15 reps Hip Flexion/Marching: AROM;Both;15 reps;Seated    General Comments        Pertinent Vitals/Pain Pain Assessment: No/denies pain    Home Living                      Prior Function            PT Goals (current goals can now be found in the care plan section) Progress towards PT goals: Progressing toward goals     Frequency    Min 3X/week      PT Plan Discharge plan needs to be updated    Co-evaluation              AM-PAC PT "6 Clicks" Mobility   Outcome Measure  Help needed turning from your back to your side while in a flat bed without using bedrails?: A Little Help needed moving from lying on your back to sitting on the side of a flat bed without using bedrails?: A Little Help needed moving to and from a bed to a chair (including a wheelchair)?: A Little Help needed standing up from a chair using your arms (e.g., wheelchair or bedside chair)?: A Little Help needed to walk in hospital room?: A Little Help needed climbing 3-5 steps with a railing? : A Lot 6 Click Score: 17    End of Session Equipment Utilized During Treatment: Gait belt Activity Tolerance: Patient tolerated treatment well Patient left: with call bell/phone within reach;in chair;with family/visitor present Nurse Communication: Mobility status PT Visit Diagnosis: Other abnormalities of gait and mobility (R26.89);Muscle weakness (generalized) (M62.81);Difficulty in walking, not elsewhere classified (R26.2)     Time: 5631-4970 PT Time Calculation (min) (ACUTE ONLY): 27 min  Charges:  $Gait Training: 8-22 mins $Therapeutic Activity: 8-22 mins                     Adaia Matthies P, PT Acute Rehabilitation Services Pager: [phone] Office: [place] 07/27/2020, 9:33 AM

## 2020-07-29 DIAGNOSIS — E1151 Type 2 diabetes mellitus with diabetic peripheral angiopathy without gangrene: Secondary | ICD-10-CM | POA: Diagnosis not present

## 2020-07-29 DIAGNOSIS — I25119 Atherosclerotic heart disease of native coronary artery with unspecified angina pectoris: Secondary | ICD-10-CM | POA: Diagnosis not present

## 2020-07-29 DIAGNOSIS — Z008 Encounter for other general examination: Secondary | ICD-10-CM | POA: Diagnosis not present

## 2020-07-29 DIAGNOSIS — E261 Secondary hyperaldosteronism: Secondary | ICD-10-CM | POA: Diagnosis not present

## 2020-07-29 DIAGNOSIS — I509 Heart failure, unspecified: Secondary | ICD-10-CM | POA: Diagnosis not present

## 2020-07-29 DIAGNOSIS — E1143 Type 2 diabetes mellitus with diabetic autonomic (poly)neuropathy: Secondary | ICD-10-CM | POA: Diagnosis not present

## 2020-07-29 DIAGNOSIS — Z794 Long term (current) use of insulin: Secondary | ICD-10-CM | POA: Diagnosis not present

## 2020-07-29 DIAGNOSIS — I4891 Unspecified atrial fibrillation: Secondary | ICD-10-CM | POA: Diagnosis not present

## 2020-07-29 DIAGNOSIS — I429 Cardiomyopathy, unspecified: Secondary | ICD-10-CM | POA: Diagnosis not present

## 2020-07-29 DIAGNOSIS — I110 Hypertensive heart disease with heart failure: Secondary | ICD-10-CM | POA: Diagnosis not present

## 2020-07-29 DIAGNOSIS — D6869 Other thrombophilia: Secondary | ICD-10-CM | POA: Diagnosis not present

## 2020-07-30 DIAGNOSIS — H9193 Unspecified hearing loss, bilateral: Secondary | ICD-10-CM | POA: Diagnosis not present

## 2020-07-30 DIAGNOSIS — I255 Ischemic cardiomyopathy: Secondary | ICD-10-CM | POA: Diagnosis not present

## 2020-07-30 DIAGNOSIS — Z7901 Long term (current) use of anticoagulants: Secondary | ICD-10-CM | POA: Diagnosis not present

## 2020-07-30 DIAGNOSIS — I130 Hypertensive heart and chronic kidney disease with heart failure and stage 1 through stage 4 chronic kidney disease, or unspecified chronic kidney disease: Secondary | ICD-10-CM | POA: Diagnosis not present

## 2020-07-30 DIAGNOSIS — I959 Hypotension, unspecified: Secondary | ICD-10-CM | POA: Diagnosis not present

## 2020-07-30 DIAGNOSIS — I5042 Chronic combined systolic (congestive) and diastolic (congestive) heart failure: Secondary | ICD-10-CM | POA: Diagnosis not present

## 2020-07-30 DIAGNOSIS — Z9181 History of falling: Secondary | ICD-10-CM | POA: Diagnosis not present

## 2020-07-30 DIAGNOSIS — K219 Gastro-esophageal reflux disease without esophagitis: Secondary | ICD-10-CM | POA: Diagnosis not present

## 2020-07-30 DIAGNOSIS — Z7984 Long term (current) use of oral hypoglycemic drugs: Secondary | ICD-10-CM | POA: Diagnosis not present

## 2020-07-30 DIAGNOSIS — I252 Old myocardial infarction: Secondary | ICD-10-CM | POA: Diagnosis not present

## 2020-07-30 DIAGNOSIS — I480 Paroxysmal atrial fibrillation: Secondary | ICD-10-CM | POA: Diagnosis not present

## 2020-07-30 DIAGNOSIS — H9311 Tinnitus, right ear: Secondary | ICD-10-CM | POA: Diagnosis not present

## 2020-07-30 DIAGNOSIS — N1831 Chronic kidney disease, stage 3a: Secondary | ICD-10-CM | POA: Diagnosis not present

## 2020-07-30 DIAGNOSIS — E785 Hyperlipidemia, unspecified: Secondary | ICD-10-CM | POA: Diagnosis not present

## 2020-07-30 DIAGNOSIS — Z794 Long term (current) use of insulin: Secondary | ICD-10-CM | POA: Diagnosis not present

## 2020-07-30 DIAGNOSIS — Z95810 Presence of automatic (implantable) cardiac defibrillator: Secondary | ICD-10-CM | POA: Diagnosis not present

## 2020-07-30 DIAGNOSIS — I25110 Atherosclerotic heart disease of native coronary artery with unstable angina pectoris: Secondary | ICD-10-CM | POA: Diagnosis not present

## 2020-07-30 DIAGNOSIS — E1122 Type 2 diabetes mellitus with diabetic chronic kidney disease: Secondary | ICD-10-CM | POA: Diagnosis not present

## 2020-07-30 DIAGNOSIS — K811 Chronic cholecystitis: Secondary | ICD-10-CM | POA: Diagnosis not present

## 2020-07-30 DIAGNOSIS — I5022 Chronic systolic (congestive) heart failure: Secondary | ICD-10-CM | POA: Diagnosis not present

## 2020-08-02 ENCOUNTER — Ambulatory Visit (INDEPENDENT_AMBULATORY_CARE_PROVIDER_SITE_OTHER): Payer: Medicare HMO

## 2020-08-02 DIAGNOSIS — I5042 Chronic combined systolic (congestive) and diastolic (congestive) heart failure: Secondary | ICD-10-CM

## 2020-08-02 DIAGNOSIS — Z95810 Presence of automatic (implantable) cardiac defibrillator: Secondary | ICD-10-CM

## 2020-08-03 ENCOUNTER — Ambulatory Visit (HOSPITAL_COMMUNITY)
Admission: RE | Admit: 2020-08-03 | Discharge: 2020-08-03 | Disposition: A | Discharge status: Home / Self Care | Payer: Medicare HMO | Source: Ambulatory Visit | Attending: Cardiology | Admitting: Cardiology

## 2020-08-03 ENCOUNTER — Other Ambulatory Visit: Payer: Self-pay

## 2020-08-03 DIAGNOSIS — I5022 Chronic systolic (congestive) heart failure: Secondary | ICD-10-CM | POA: Insufficient documentation

## 2020-08-03 LAB — BASIC METABOLIC PANEL
Anion gap: 9 (ref 5–15)
BUN: 32 mg/dL — ABNORMAL HIGH (ref 8–23)
CO2: 28 mmol/L (ref 22–32)
Calcium: 9.6 mg/dL (ref 8.9–10.3)
Chloride: 99 mmol/L (ref 98–111)
Creatinine, Ser: 2.24 mg/dL — ABNORMAL HIGH (ref 0.61–1.24)
GFR, Estimated: 29 mL/min — ABNORMAL LOW (ref >60)
Glucose, Bld: 175 mg/dL — ABNORMAL HIGH (ref 70–99)
Potassium: 4.4 mmol/L (ref 3.5–5.1)
Sodium: 136 mmol/L (ref 135–145)

## 2020-08-04 ENCOUNTER — Telehealth (HOSPITAL_COMMUNITY): Payer: Self-pay

## 2020-08-04 DIAGNOSIS — I255 Ischemic cardiomyopathy: Secondary | ICD-10-CM | POA: Diagnosis not present

## 2020-08-04 DIAGNOSIS — Z794 Long term (current) use of insulin: Secondary | ICD-10-CM | POA: Diagnosis not present

## 2020-08-04 DIAGNOSIS — E1122 Type 2 diabetes mellitus with diabetic chronic kidney disease: Secondary | ICD-10-CM | POA: Diagnosis not present

## 2020-08-04 DIAGNOSIS — K219 Gastro-esophageal reflux disease without esophagitis: Secondary | ICD-10-CM | POA: Diagnosis not present

## 2020-08-04 DIAGNOSIS — I130 Hypertensive heart and chronic kidney disease with heart failure and stage 1 through stage 4 chronic kidney disease, or unspecified chronic kidney disease: Secondary | ICD-10-CM | POA: Diagnosis not present

## 2020-08-04 DIAGNOSIS — I5042 Chronic combined systolic (congestive) and diastolic (congestive) heart failure: Secondary | ICD-10-CM | POA: Diagnosis not present

## 2020-08-04 DIAGNOSIS — Z7984 Long term (current) use of oral hypoglycemic drugs: Secondary | ICD-10-CM | POA: Diagnosis not present

## 2020-08-04 DIAGNOSIS — N1831 Chronic kidney disease, stage 3a: Secondary | ICD-10-CM | POA: Diagnosis not present

## 2020-08-04 DIAGNOSIS — I480 Paroxysmal atrial fibrillation: Secondary | ICD-10-CM | POA: Diagnosis not present

## 2020-08-04 DIAGNOSIS — E785 Hyperlipidemia, unspecified: Secondary | ICD-10-CM | POA: Diagnosis not present

## 2020-08-04 DIAGNOSIS — Z95810 Presence of automatic (implantable) cardiac defibrillator: Secondary | ICD-10-CM | POA: Diagnosis not present

## 2020-08-04 DIAGNOSIS — H9311 Tinnitus, right ear: Secondary | ICD-10-CM | POA: Diagnosis not present

## 2020-08-04 DIAGNOSIS — I959 Hypotension, unspecified: Secondary | ICD-10-CM | POA: Diagnosis not present

## 2020-08-04 DIAGNOSIS — Z9181 History of falling: Secondary | ICD-10-CM | POA: Diagnosis not present

## 2020-08-04 DIAGNOSIS — Z7901 Long term (current) use of anticoagulants: Secondary | ICD-10-CM | POA: Diagnosis not present

## 2020-08-04 DIAGNOSIS — I25110 Atherosclerotic heart disease of native coronary artery with unstable angina pectoris: Secondary | ICD-10-CM | POA: Diagnosis not present

## 2020-08-04 DIAGNOSIS — K811 Chronic cholecystitis: Secondary | ICD-10-CM | POA: Diagnosis not present

## 2020-08-04 DIAGNOSIS — I252 Old myocardial infarction: Secondary | ICD-10-CM | POA: Diagnosis not present

## 2020-08-04 DIAGNOSIS — H9193 Unspecified hearing loss, bilateral: Secondary | ICD-10-CM | POA: Diagnosis not present

## 2020-08-04 MED ORDER — FUROSEMIDE 40 MG PO TABS: 60.0000 mg | ORAL_TABLET | Freq: Every day | ORAL | 3 refills | Status: DC

## 2020-08-04 NOTE — Progress Notes (Signed)
EPIC Encounter for ICM Monitoring  Patient Name: [name] is a 80 y.o. male Date: 08/04/2020 Primary Care Physican: Mayra Neer, MD Primary Cardiologist:[place] Electrophysiologist: Allred Bi-V Pacing: 96.7% 3/15/2022OfficeWeight: 179lbs  Since 23-Jul-2020 Time in AT/AF <0.1 hr/day (<0.1%) Longest AT/AF 51 seconds   Spoke with patient and reports feeling well at this time.  Denies fluid symptoms.    OptiVolThoracicimpedancesuggesting normal fluid levels.  Prescribed:  Furosemide40 mg 1.5 tablets (60 mg total) by mouth daily. (decreased 4/20 per lab result note)  Potassium 20 mEq take 2 tablets by mouth daily   Labs: 08/03/2020 Creatinine 2.24, BUN 32, Potassium 4.4, Sodium 136, GFR 29 07/27/2020 Creatinine 1.58, BUN 29, Potassium 3.1, Sodium 137, GFR 45  07/26/2020 Creatinine 1.82, BUN 31, Potassium 3.7, Sodium 136, GFR 38  07/25/2020 Creatinine 1.70, BUN 25, Potassium 3.6, Sodium 134, GFR 41  07/24/2020 Creatinine 1.53, BUN 26, Potassium 3.1, Sodium 135, GFR 47  A complete set of results can be found in Results Review.  Recommendations:   No changes and encouraged to call if experiencing any fluid symptoms.  Follow-up plan: ICM clinic phone appointment on6/09/2020. 91 day device clinic remote transmission4/28/2022.   EP/Cardiology Office Visits:08/10/2020 with Dr Aundra Dubin.08/27/2020 with Dr Rayann Heman.  Copy of ICM check sent to Dr.Allred.   3 month ICM trend: 08/02/2020.    1 Year ICM trend:       [name], RN 08/04/2020 3:09 PM

## 2020-08-04 NOTE — Telephone Encounter (Signed)
-----   Message from Larey Dresser, MD sent at 08/03/2020  3:43 PM EDT ----- Hold Lasix for a day and decrease to 60 mg daily.  BMET 1 week.

## 2020-08-04 NOTE — Telephone Encounter (Signed)
Patient & spouse both advised and verbalized understanding. Med list updated to reflect changes, patient has pending appt next Tuesday at 8:40am   Meds ordered this encounter  Medications  . furosemide (LASIX) 40 MG tablet    Sig: Take 1.5 tablets (60 mg total) by mouth daily.    Dispense:  135 tablet    Refill:  3

## 2020-08-05 ENCOUNTER — Other Ambulatory Visit (HOSPITAL_COMMUNITY): Payer: Self-pay | Admitting: Cardiology

## 2020-08-06 DIAGNOSIS — Z7901 Long term (current) use of anticoagulants: Secondary | ICD-10-CM | POA: Diagnosis not present

## 2020-08-06 DIAGNOSIS — I130 Hypertensive heart and chronic kidney disease with heart failure and stage 1 through stage 4 chronic kidney disease, or unspecified chronic kidney disease: Secondary | ICD-10-CM | POA: Diagnosis not present

## 2020-08-06 DIAGNOSIS — Z95810 Presence of automatic (implantable) cardiac defibrillator: Secondary | ICD-10-CM | POA: Diagnosis not present

## 2020-08-06 DIAGNOSIS — Z794 Long term (current) use of insulin: Secondary | ICD-10-CM | POA: Diagnosis not present

## 2020-08-06 DIAGNOSIS — I480 Paroxysmal atrial fibrillation: Secondary | ICD-10-CM | POA: Diagnosis not present

## 2020-08-06 DIAGNOSIS — I959 Hypotension, unspecified: Secondary | ICD-10-CM | POA: Diagnosis not present

## 2020-08-06 DIAGNOSIS — I25110 Atherosclerotic heart disease of native coronary artery with unstable angina pectoris: Secondary | ICD-10-CM | POA: Diagnosis not present

## 2020-08-06 DIAGNOSIS — Z7984 Long term (current) use of oral hypoglycemic drugs: Secondary | ICD-10-CM | POA: Diagnosis not present

## 2020-08-06 DIAGNOSIS — N1831 Chronic kidney disease, stage 3a: Secondary | ICD-10-CM | POA: Diagnosis not present

## 2020-08-06 DIAGNOSIS — K811 Chronic cholecystitis: Secondary | ICD-10-CM | POA: Diagnosis not present

## 2020-08-06 DIAGNOSIS — Z9181 History of falling: Secondary | ICD-10-CM | POA: Diagnosis not present

## 2020-08-06 DIAGNOSIS — E1122 Type 2 diabetes mellitus with diabetic chronic kidney disease: Secondary | ICD-10-CM | POA: Diagnosis not present

## 2020-08-06 DIAGNOSIS — H9193 Unspecified hearing loss, bilateral: Secondary | ICD-10-CM | POA: Diagnosis not present

## 2020-08-06 DIAGNOSIS — K219 Gastro-esophageal reflux disease without esophagitis: Secondary | ICD-10-CM | POA: Diagnosis not present

## 2020-08-06 DIAGNOSIS — I255 Ischemic cardiomyopathy: Secondary | ICD-10-CM | POA: Diagnosis not present

## 2020-08-06 DIAGNOSIS — E785 Hyperlipidemia, unspecified: Secondary | ICD-10-CM | POA: Diagnosis not present

## 2020-08-06 DIAGNOSIS — H9311 Tinnitus, right ear: Secondary | ICD-10-CM | POA: Diagnosis not present

## 2020-08-06 DIAGNOSIS — I5042 Chronic combined systolic (congestive) and diastolic (congestive) heart failure: Secondary | ICD-10-CM | POA: Diagnosis not present

## 2020-08-06 DIAGNOSIS — I252 Old myocardial infarction: Secondary | ICD-10-CM | POA: Diagnosis not present

## 2020-08-10 ENCOUNTER — Other Ambulatory Visit: Payer: Self-pay

## 2020-08-10 ENCOUNTER — Encounter (HOSPITAL_COMMUNITY): Payer: Self-pay | Admitting: Cardiology

## 2020-08-10 ENCOUNTER — Ambulatory Visit (HOSPITAL_COMMUNITY)
Admit: 2020-08-10 | Discharge: 2020-08-10 | Disposition: A | Discharge status: Home / Self Care | Payer: Medicare HMO | Source: Ambulatory Visit | Attending: Cardiology | Admitting: Cardiology

## 2020-08-10 VITALS — Wt 171.4 lb

## 2020-08-10 DIAGNOSIS — Z79899 Other long term (current) drug therapy: Secondary | ICD-10-CM | POA: Diagnosis not present

## 2020-08-10 DIAGNOSIS — E1122 Type 2 diabetes mellitus with diabetic chronic kidney disease: Secondary | ICD-10-CM | POA: Insufficient documentation

## 2020-08-10 DIAGNOSIS — E785 Hyperlipidemia, unspecified: Secondary | ICD-10-CM | POA: Insufficient documentation

## 2020-08-10 DIAGNOSIS — I5022 Chronic systolic (congestive) heart failure: Secondary | ICD-10-CM | POA: Diagnosis not present

## 2020-08-10 DIAGNOSIS — Z8249 Family history of ischemic heart disease and other diseases of the circulatory system: Secondary | ICD-10-CM | POA: Diagnosis not present

## 2020-08-10 DIAGNOSIS — Z951 Presence of aortocoronary bypass graft: Secondary | ICD-10-CM | POA: Diagnosis not present

## 2020-08-10 DIAGNOSIS — R42 Dizziness and giddiness: Secondary | ICD-10-CM | POA: Diagnosis not present

## 2020-08-10 DIAGNOSIS — Z7902 Long term (current) use of antithrombotics/antiplatelets: Secondary | ICD-10-CM | POA: Insufficient documentation

## 2020-08-10 DIAGNOSIS — R531 Weakness: Secondary | ICD-10-CM | POA: Diagnosis not present

## 2020-08-10 DIAGNOSIS — I255 Ischemic cardiomyopathy: Secondary | ICD-10-CM | POA: Insufficient documentation

## 2020-08-10 DIAGNOSIS — N183 Chronic kidney disease, stage 3 unspecified: Secondary | ICD-10-CM | POA: Diagnosis not present

## 2020-08-10 DIAGNOSIS — Z87891 Personal history of nicotine dependence: Secondary | ICD-10-CM | POA: Insufficient documentation

## 2020-08-10 DIAGNOSIS — I5042 Chronic combined systolic (congestive) and diastolic (congestive) heart failure: Secondary | ICD-10-CM | POA: Diagnosis not present

## 2020-08-10 DIAGNOSIS — Z955 Presence of coronary angioplasty implant and graft: Secondary | ICD-10-CM | POA: Insufficient documentation

## 2020-08-10 DIAGNOSIS — I472 Ventricular tachycardia: Secondary | ICD-10-CM | POA: Diagnosis not present

## 2020-08-10 DIAGNOSIS — Z7901 Long term (current) use of anticoagulants: Secondary | ICD-10-CM | POA: Diagnosis not present

## 2020-08-10 DIAGNOSIS — I480 Paroxysmal atrial fibrillation: Secondary | ICD-10-CM | POA: Diagnosis not present

## 2020-08-10 DIAGNOSIS — Z794 Long term (current) use of insulin: Secondary | ICD-10-CM | POA: Diagnosis not present

## 2020-08-10 LAB — COMPREHENSIVE METABOLIC PANEL
ALT: 20 U/L (ref 0–44)
AST: 21 U/L (ref 15–41)
Albumin: 3.7 g/dL (ref 3.5–5.0)
Alkaline Phosphatase: 82 U/L (ref 38–126)
Anion gap: 10 (ref 5–15)
BUN: 22 mg/dL (ref 8–23)
CO2: 31 mmol/L (ref 22–32)
Calcium: 9.4 mg/dL (ref 8.9–10.3)
Chloride: 97 mmol/L — ABNORMAL LOW (ref 98–111)
Creatinine, Ser: 1.91 mg/dL — ABNORMAL HIGH (ref 0.61–1.24)
GFR, Estimated: 36 mL/min — ABNORMAL LOW (ref >60)
Glucose, Bld: 171 mg/dL — ABNORMAL HIGH (ref 70–99)
Potassium: 4.3 mmol/L (ref 3.5–5.1)
Sodium: 138 mmol/L (ref 135–145)
Total Bilirubin: 0.8 mg/dL (ref 0.3–1.2)
Total Protein: 6.9 g/dL (ref 6.5–8.1)

## 2020-08-10 LAB — TSH: TSH: 3.251 u[IU]/mL (ref 0.350–4.500)

## 2020-08-10 MED ORDER — MIDODRINE HCL 5 MG PO TABS: 5.0000 mg | ORAL_TABLET | Freq: Three times a day (TID) | ORAL | 11 refills | Status: DC

## 2020-08-10 NOTE — Progress Notes (Signed)
Advanced Heart Failure Clinic Note   Patient ID: [name], male   DOB: 14-Dec-1940, 80 y.o.   MRN: [id number] PCP: Dr. Lynnda Child Cardiology: Dr Aundra Dubin  40 y.o. with history of CAD s/p CABG and redo CABG as well as ischemic cardiomyopathy with CRT-D device presents for followup of CHF and CAD.  He had a Cardiolite in the 4/15 showing lateral wall ischemia.  LHC at that time showed all his vein grafts from both CABG surgeries occluded.  The LIMA-LAD was patent and there was a 90% distal LM stenosis.  The lateral ischemia likely corresponded to LCx territory downstream from the LM stenosis.  PCI of the distal LM was thought to be high risk and characteristics of the lesion were not favorable for PCI.  Last echo showed EF 25-30% with moderate RV systolic dysfunction.  He had RHC in 11/15 with normal filling pressures and relatively preserved cardiac index (2.55).    In 4/17, he was admitted with chest pain concerning for unstable angina.  Angiography showed 85% distal LM with 90% ostial LCx stenosis.  LIMA was patent but proximal LAD, proximal RCA, and all SVGs were totally occluded. High had successful DES to LM and PTCA to ostial LCx with Impella support.  [name] was stopped and he was put back on low dose lisinopril due to symptomatic hypotension.  He had Cardiolite in 11/17 with scar, no ischemia.   He was admitted in 1/19 with AKI, creatinine up to 3. Meds held then restarted.   Echo 3/19 with EF 30-35%.   He was admitted with syncope and AKI in 6/19.  He was thought to be dehydrated and orthostatic. Losartan stopped but eventually restarted.   He was noted to have prolonged atrial fibrillation by device check in 3/20, now on Eliquis.   Echo was done in 6/20, showing EF 25-30% with inferior/inferolateral AK, moderate LV dilation, mild LVH, mild MR, severe LAE.    CPX in 11/20 showed moderate functional limitation due to HF.   He went into atrial fibrillation in 6/21, had TEE-guided DCCV  in 6/21 back to NSR.  TEE showed EF 25-30%, moderately dilated LV, normal RV.   He was admitted in 4/22 with VT and ICD discharge.  RHC/LHC showed totally occluded SVGs, totally occluded RCA and LAD; the LIMA-LAD was patent and there was a patent LM sent with good flow into the LCx (no changes).  Filling pressure and cardiac output were normal. VT was thought to be scar-mediated.  Amiodarone was begun.  BP was soft in the hospital, meds were cut back.    He returns for followup of CHF and CAD.  He is in NSR today.  Poor appetite, more confused with worse memory since last admission. He has PT coming to the house.  Uses walker around the house.  Feeling weak. BP is low today.  He is lightheaded with standing.  No dyspnea though not very active.  He is tired and weak generally. No syncope though he has had 1 fall.   ECG (personally reviewed): NSR, BiV pacing  Medtronic device interrogation: Thoracic impedance stable, >98% BiV pacing.   Labs (9/15): LDL particle number 816, LDL 57, LFTs normal Labs (10/15): K 4.4, creatinine 1.4 Labs (11/15): K 4.3, creatinine 1.36 Labs (12/15): K 4.8, creatinine 1.34 Labs (3/16): K 4, creatinine 1.38, LDL 46, HDl 35 Labs (7/16): K 4, creatinine 1.39, BNP 211 Labs (03/03/15): K 4.1, creatinine 1.47, BNP 227, LDL 80, HDL 32 Labs (12/16): K  4.6, creatinine 1.12, LDL 68, HDL 34 Labs (4/17): K 3.4, creatinine 1.15 Labs (6/17): K 3.8, creatinine 1.67 Labs (7/17): K 4, creatinine 1.35 Labs (10/17): K 3.9, creatinine 1.31, LDL 45, HDL 30 Labs (2/18): K 4, creatinine 1.43 Labs (5/18): LDL 44, HDL 27 Labs (6/18): K 4.1, creatinine 1.49 Labs (12/18): K 4.4, creatinine 1.44 Labs (1/19): K 4.3, creatinine 1.48 Labs (2/19): K 3.7, creatinine 1.6 Labs (6/19): K 4, creatinine 1.5 Labs (9/19): K 4.4, creatinine 1.6, hgb 13.4 Labs (10/19): K 4.3, creatinine 1.44 Labs (1/20): K 4, creatinine 1.65 Labs (6/20): K 3.9, creatinine 1.58, LDL 58, HDL 31, hgb 13.3 Labs (7/20):  K 3.9, creatinine 1.5 Labs (10/20): K 4, creatinine 1.7 Labs (12/20): K 3.9, creatinine 1.56 Labs (5/21): K 4.1, creatinine 1.7 Labs (8/21): K 4.4, creatinine 1.66, LDL 57 Labs (4/22): creatinine 1.58, hgb 12.3  PMH: 1. Gout 2. Hyperlipidemia 3. CAD: CABG 1997 and redo 11/12.   - LHC (4/15) with totally occluded LAD, totally occluded RCA, 80% distal LM, 50% mLCx, 2 SVG-RCA grafts totally occluded, 2 SVG-OM grafts totally occluded, patent LIMA-LAD.  Cardiolite prior to 4/15 cath showed lateral wall ischemia (LCx territory).  PCI to distal LM would be a high risk procedure.   - Cardiolite (8/16) with EF 20%, severe scar in RCA and probably LCx territory, minimal peri-infarct ischemia.   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  - Unstable angina 4/17: 85% distal LM with 90% ostial LCx stenosis.  LIMA was patent but proximal LAD, proximal RCA, and all SVGs were totally occluded. He had successful DES to LM and PTCA to ostial LCx with Impella support  - Cardiolite (11/17): EF 20%, prior MI, no ischemia.  - LHC (4/22): totally occluded SVGs, totally occluded RCA and LAD; the LIMA-LAD was patent and there was a patent LM sent with good flow into the LCx (no changes) 4. Ischemic Cardiomyopathy: Medtronic CRT-D device.   - Echo (6/15) with EF 25-30%, moderate LV dilation, inferior and inferolateral akinesis, moderately decreased RV systolic function, mild MR.   - RHC (11/15) with mean RA 5, PA 23/6, mean PCWP 9, CI 2.55.  - Echo (4/17): EF 25-30% with mild MR.  - Echo (11/17): EF 25-30%, moderately dilated LV, grade II diastolic dysfunction, mild-moderate MR, mildly decreased RV systolic function.  - Echo (3/19): EF 30-35%, inferior/inferolateral akinesis, normal RV size with mildly decreased systolic function.  - Echo (6/20): EF 25-30% with inferior/inferolateral AK, moderate LV dilation, mild LVH, mild MR, severe LAE. - CPX (11/20): VO2 max 14.9 (60% predicted),  VE/VCO2 slope 34, RER 1.05.  Moderate functional deficit due to HF.  - TEE (6/21): EF 25-30%, moderately dilated LV, normal RV. - RHC (4/22): mean RA 3, PA 23/3, mean PCWP 4, CI 3.13 Fick/3.25 thermo 5. H/o cholecystectomy 6. OA 7. Depression 8. Type II diabetes 9. GERD 10. CKD stage 3 11. ?Early dementia 12. Atrial fibrillation: Paroxysmal.  - DCCV to NSR in 6/21.  13. B12 deficiency.  14. VT in 4/22: Amiodarone started.   SH: Married, prior smoker (many years ago), lives in [place].    FH: CAD  ROS: All systems reviewed and negative except as per HPI.   Current Outpatient Medications  Medication Sig Dispense Refill  . acetaminophen (TYLENOL) 500 MG tablet Take 1,000 mg by mouth every 6 (six) hours as needed for moderate pain.    Marland Kitchen allopurinol (ZYLOPRIM) 100 MG tablet Take 200 mg by mouth daily.    Marland Kitchen  amiodarone (PACERONE) 200 MG tablet Take 1 tablet (200 mg total) by mouth daily. 30 tablet 6  . atorvastatin (LIPITOR) 80 MG tablet TAKE 1 TABLET BY MOUTH ONCE DAILY AT  6PM  IN  THE  EVENING 90 tablet 0  . Continuous Blood Gluc Sensor (FREESTYLE LIBRE 14 DAY SENSOR) MISC USE TO CHECK GLUCOSE THREE TIMES DAILY AS DIRECTED    . ELIQUIS 5 MG TABS tablet Take 1 tablet by mouth twice daily 60 tablet 5  . furosemide (LASIX) 40 MG tablet Take 1.5 tablets (60 mg total) by mouth daily. 135 tablet 3  . Homeopathic Products [facility] ALLERGY EYE RELIEF OP) Place 1 drop into both eyes daily as needed (irritation).    . Insulin Isophane & Regular Human (NOVOLIN 70/30 FLEXPEN) (70-30) 100 UNIT/ML PEN Inject 40 Units into the skin daily before lunch.     . loperamide (IMODIUM A-D) 2 MG tablet Take 2 mg by mouth 4 (four) times daily as needed for diarrhea or loose stools.    . metoprolol succinate (TOPROL XL) 25 MG 24 hr tablet Take 0.5 tablets (12.5 mg total) by mouth at bedtime. 15 tablet 11  . midodrine (PROAMATINE) 5 MG tablet Take 1 tablet (5 mg total) by mouth 3 (three) times daily with  meals. 90 tablet 11  . nitroGLYCERIN (NITROSTAT) 0.4 MG SL tablet Place 1 tablet (0.4 mg total) under the tongue every 5 (five) minutes as needed for chest pain. DO NOT EXCEED A TOTAL OF 3 DOSES IN 15 MINUTES 25 tablet 6  . potassium chloride SA (KLOR-CON) 20 MEQ tablet Take 2 tablets (40 mEq total) by mouth daily. 60 tablet 6  . QUEtiapine (SEROQUEL) 100 MG tablet Take 100 mg by mouth at bedtime. Taking 0.5 tablet at bedtime    . ranolazine (RANEXA) 500 MG 12 hr tablet Take 500 mg by mouth 2 (two) times daily.    Marland Kitchen triamcinolone cream (KENALOG) 0.1 % Apply 1 application topically 2 (two) times daily as needed (for dry/irritated skin.).    Marland Kitchen venlafaxine (EFFEXOR-XR) 150 MG 24 hr capsule Take 150 mg by mouth daily with breakfast.    . vitamin B-12 (CYANOCOBALAMIN) 1000 MCG tablet Take 1,000 mcg by mouth daily.     No current facility-administered medications for this encounter.   Wt 77.7 kg (171 lb 6.4 oz)   SpO2 93%   BMI 25.31 kg/m    Wt Readings from Last 3 Encounters:  08/10/20 77.7 kg (171 lb 6.4 oz)  07/26/20 80.9 kg (178 lb 5.6 oz)  06/29/20 81.6 kg (179 lb 12.8 oz)    General: NAD, frail Neck: No JVD, no thyromegaly or thyroid nodule.  Lungs: Clear to auscultation bilaterally with normal respiratory effort. CV: Nondisplaced PMI.  Heart regular S1/S2, no S3/S4, no murmur.  No peripheral edema.  No carotid bruit.  Normal pedal pulses.  Abdomen: Soft, nontender, no hepatosplenomegaly, no distention.  Skin: Intact without lesions or rashes.  Neurologic: Alert and oriented x 3.  Psych: Normal affect. Extremities: No clubbing or cyanosis.  HEENT: Normal.   Assessment/Plan: 1. Chronic systolic CHF: Ischemic cardiomyopathy. He has a Medtronic CRT-D device.  CPX in 11/20 showed moderate functional limitation due to CHF.  TEE in 6/21 with EF 25-30%, normal RV.  RHC in 4/22 showed normal filling pressures and normal cardiac output.  He is not volume overloaded on exam or by Optivol. NYHA  class III symptoms, primarily due to weakness/fatigue.  BP is very low.   - Stop  eplerenone and Farxiga.   - Hold Lasix today, restart tomorrow.        - Wear graded compression stockings and abdominal binder.  - Start midodrine 5 mg tid.  2. CAD: s/p CABG and redo. Had DES to distal LM, angioplasty to ostial LCx in 4/17. Cardiolite 11/17 with scar, no ischemia. LHC in 4/22 showed patent LIMA-LAD and patent LM stent with good flow down LCx, no interventional target.  - Continue statin.  - He is off ASA/Plavix and taking apixaban 5 mg bid.  - Continue ranolazine 1000 mg bid.  3. CKD stage III: BMET today.    4. Hyperlipidemia: Continue statin. Good lipids in 3/22.  5. Atrial fibrillation: Paroxysmal atrial fibrillation.  - Continue apixaban.   - Continue amiodarone 200 mg daily, check LFTs and TSH today.  Will need regular eye exam.  6. VT: In 4/22, unchanged coronaries/grafts and optimized filling pressures/cardiac output.  Think scar-mediated.  Amiodarone begun.  - Continue amiodarone 200 mg daily.   Followup with NP/PA in 2 wks.   [name] 08/10/2020

## 2020-08-10 NOTE — Patient Instructions (Addendum)
Labs done today. We will contact you only if your labs are abnormal.  HOLD Lasix for 1 day then restart.   STOP taking Inspra   STOP taking Farxiga  START Midodrine 5mg  (1 tablet) by mouth 3 times daily   No other medication changes were made. Please continue all current medications as prescribed.  Please wear your compression hose daily, place them on as soon as you get up in the morning and remove before you go to bed at night.  Drink Boost or Ensure 3 times daily.  Please start wearing an Abdominal binder daily. A prescription was giving to you today in office. You try getting this at your local pharmacy or at [facility] [address] [place], [place] [id number]  Your physician recommends that you schedule a follow-up appointment in: 2 weeks with our APP Clinic here in office.   If you have any questions or concerns before your next appointment please send [place] a message through [place] or call our office at [phone].    TO LEAVE A MESSAGE FOR THE NURSE SELECT OPTION 2, PLEASE LEAVE A MESSAGE INCLUDING: . YOUR NAME . DATE OF BIRTH . CALL BACK NUMBER . REASON FOR CALL**this is important as we prioritize the call backs  YOU WILL RECEIVE A CALL BACK THE SAME DAY AS LONG AS YOU CALL BEFORE 4:00 PM   Do the following things EVERYDAY: 1) Weigh yourself in the morning before breakfast. Write it down and keep it in a log. 2) Take your medicines as prescribed 3) Eat low salt foods--Limit salt (sodium) to 2000 mg per day.  4) Stay as active as you can everyday 5) Limit all fluids for the day to less than 2 liters   At the [facility], you and your health needs are our priority. As part of our continuing mission to provide you with exceptional heart care, we have created designated Provider Care Teams. These Care Teams include your primary Cardiologist (physician) and Advanced Practice Providers (APPs- Physician Assistants and Nurse Practitioners) who all  work together to provide you with the care you need, when you need it.   You may see any of the following providers on your designated Care Team at your next follow up: Marland Kitchen Dr Glori Bickers . Dr [name] . Darrick Grinder, NP . Lyda Jester, PA . Audry Riles, PharmD   Please be sure to bring in all your medications bottles to every appointment.

## 2020-08-11 DIAGNOSIS — K219 Gastro-esophageal reflux disease without esophagitis: Secondary | ICD-10-CM | POA: Diagnosis not present

## 2020-08-11 DIAGNOSIS — I5042 Chronic combined systolic (congestive) and diastolic (congestive) heart failure: Secondary | ICD-10-CM | POA: Diagnosis not present

## 2020-08-11 DIAGNOSIS — I255 Ischemic cardiomyopathy: Secondary | ICD-10-CM | POA: Diagnosis not present

## 2020-08-11 DIAGNOSIS — E1122 Type 2 diabetes mellitus with diabetic chronic kidney disease: Secondary | ICD-10-CM | POA: Diagnosis not present

## 2020-08-11 DIAGNOSIS — Z9181 History of falling: Secondary | ICD-10-CM | POA: Diagnosis not present

## 2020-08-11 DIAGNOSIS — I130 Hypertensive heart and chronic kidney disease with heart failure and stage 1 through stage 4 chronic kidney disease, or unspecified chronic kidney disease: Secondary | ICD-10-CM | POA: Diagnosis not present

## 2020-08-11 DIAGNOSIS — Z95810 Presence of automatic (implantable) cardiac defibrillator: Secondary | ICD-10-CM | POA: Diagnosis not present

## 2020-08-11 DIAGNOSIS — N1831 Chronic kidney disease, stage 3a: Secondary | ICD-10-CM | POA: Diagnosis not present

## 2020-08-11 DIAGNOSIS — I959 Hypotension, unspecified: Secondary | ICD-10-CM | POA: Diagnosis not present

## 2020-08-11 DIAGNOSIS — Z794 Long term (current) use of insulin: Secondary | ICD-10-CM | POA: Diagnosis not present

## 2020-08-11 DIAGNOSIS — I480 Paroxysmal atrial fibrillation: Secondary | ICD-10-CM | POA: Diagnosis not present

## 2020-08-11 DIAGNOSIS — I252 Old myocardial infarction: Secondary | ICD-10-CM | POA: Diagnosis not present

## 2020-08-11 DIAGNOSIS — Z7901 Long term (current) use of anticoagulants: Secondary | ICD-10-CM | POA: Diagnosis not present

## 2020-08-11 DIAGNOSIS — K811 Chronic cholecystitis: Secondary | ICD-10-CM | POA: Diagnosis not present

## 2020-08-11 DIAGNOSIS — E785 Hyperlipidemia, unspecified: Secondary | ICD-10-CM | POA: Diagnosis not present

## 2020-08-11 DIAGNOSIS — I25110 Atherosclerotic heart disease of native coronary artery with unstable angina pectoris: Secondary | ICD-10-CM | POA: Diagnosis not present

## 2020-08-11 DIAGNOSIS — Z7984 Long term (current) use of oral hypoglycemic drugs: Secondary | ICD-10-CM | POA: Diagnosis not present

## 2020-08-11 DIAGNOSIS — H9311 Tinnitus, right ear: Secondary | ICD-10-CM | POA: Diagnosis not present

## 2020-08-11 DIAGNOSIS — H9193 Unspecified hearing loss, bilateral: Secondary | ICD-10-CM | POA: Diagnosis not present

## 2020-08-12 ENCOUNTER — Ambulatory Visit (INDEPENDENT_AMBULATORY_CARE_PROVIDER_SITE_OTHER): Payer: Medicare HMO

## 2020-08-12 DIAGNOSIS — I255 Ischemic cardiomyopathy: Secondary | ICD-10-CM

## 2020-08-12 LAB — CUP PACEART REMOTE DEVICE CHECK
Battery Remaining Longevity: 31 mo
Battery Voltage: 2.94 V
Brady Statistic AP VP Percent: 46.57 %
Brady Statistic AP VS Percent: 0.03 %
Brady Statistic AS VP Percent: 53.28 %
Brady Statistic AS VS Percent: 0.13 %
Brady Statistic RA Percent Paced: 46.07 %
Brady Statistic RV Percent Paced: 99.56 %
Date Time Interrogation Session: 20220428012405
HighPow Impedance: 44 Ohm
HighPow Impedance: 55 Ohm
Implantable Lead Implant Date: 20111003
Implantable Lead Implant Date: 20111003
Implantable Lead Implant Date: 20130509
Implantable Lead Location: 753858
Implantable Lead Location: 753859
Implantable Lead Location: 753860
Implantable Lead Model: 4196
Implantable Lead Model: 5076
Implantable Lead Model: 6947
Implantable Pulse Generator Implant Date: 20191030
Lead Channel Impedance Value: 380 Ohm
Lead Channel Impedance Value: 399 Ohm
Lead Channel Impedance Value: 456 Ohm
Lead Channel Impedance Value: 551 Ohm
Lead Channel Impedance Value: 665 Ohm
Lead Channel Impedance Value: 722 Ohm
Lead Channel Pacing Threshold Amplitude: 0.625 V
Lead Channel Pacing Threshold Amplitude: 0.875 V
Lead Channel Pacing Threshold Amplitude: 2.5 V
Lead Channel Pacing Threshold Pulse Width: 0.4 ms
Lead Channel Pacing Threshold Pulse Width: 0.4 ms
Lead Channel Pacing Threshold Pulse Width: 0.4 ms
Lead Channel Sensing Intrinsic Amplitude: 1.25 mV
Lead Channel Sensing Intrinsic Amplitude: 1.25 mV
Lead Channel Sensing Intrinsic Amplitude: 31.625 mV
Lead Channel Sensing Intrinsic Amplitude: 31.625 mV
Lead Channel Setting Pacing Amplitude: 1.5 V
Lead Channel Setting Pacing Amplitude: 2 V
Lead Channel Setting Pacing Amplitude: 5 V
Lead Channel Setting Pacing Pulse Width: 0.4 ms
Lead Channel Setting Pacing Pulse Width: 0.4 ms
Lead Channel Setting Sensing Sensitivity: 0.3 mV

## 2020-08-13 DIAGNOSIS — Z7901 Long term (current) use of anticoagulants: Secondary | ICD-10-CM | POA: Diagnosis not present

## 2020-08-13 DIAGNOSIS — I255 Ischemic cardiomyopathy: Secondary | ICD-10-CM | POA: Diagnosis not present

## 2020-08-13 DIAGNOSIS — I252 Old myocardial infarction: Secondary | ICD-10-CM | POA: Diagnosis not present

## 2020-08-13 DIAGNOSIS — I959 Hypotension, unspecified: Secondary | ICD-10-CM | POA: Diagnosis not present

## 2020-08-13 DIAGNOSIS — N1831 Chronic kidney disease, stage 3a: Secondary | ICD-10-CM | POA: Diagnosis not present

## 2020-08-13 DIAGNOSIS — Z7984 Long term (current) use of oral hypoglycemic drugs: Secondary | ICD-10-CM | POA: Diagnosis not present

## 2020-08-13 DIAGNOSIS — I480 Paroxysmal atrial fibrillation: Secondary | ICD-10-CM | POA: Diagnosis not present

## 2020-08-13 DIAGNOSIS — K219 Gastro-esophageal reflux disease without esophagitis: Secondary | ICD-10-CM | POA: Diagnosis not present

## 2020-08-13 DIAGNOSIS — Z95810 Presence of automatic (implantable) cardiac defibrillator: Secondary | ICD-10-CM | POA: Diagnosis not present

## 2020-08-13 DIAGNOSIS — Z794 Long term (current) use of insulin: Secondary | ICD-10-CM | POA: Diagnosis not present

## 2020-08-13 DIAGNOSIS — K811 Chronic cholecystitis: Secondary | ICD-10-CM | POA: Diagnosis not present

## 2020-08-13 DIAGNOSIS — Z9181 History of falling: Secondary | ICD-10-CM | POA: Diagnosis not present

## 2020-08-13 DIAGNOSIS — I5042 Chronic combined systolic (congestive) and diastolic (congestive) heart failure: Secondary | ICD-10-CM | POA: Diagnosis not present

## 2020-08-13 DIAGNOSIS — I25110 Atherosclerotic heart disease of native coronary artery with unstable angina pectoris: Secondary | ICD-10-CM | POA: Diagnosis not present

## 2020-08-13 DIAGNOSIS — I130 Hypertensive heart and chronic kidney disease with heart failure and stage 1 through stage 4 chronic kidney disease, or unspecified chronic kidney disease: Secondary | ICD-10-CM | POA: Diagnosis not present

## 2020-08-13 DIAGNOSIS — H9193 Unspecified hearing loss, bilateral: Secondary | ICD-10-CM | POA: Diagnosis not present

## 2020-08-13 DIAGNOSIS — E785 Hyperlipidemia, unspecified: Secondary | ICD-10-CM | POA: Diagnosis not present

## 2020-08-13 DIAGNOSIS — E1122 Type 2 diabetes mellitus with diabetic chronic kidney disease: Secondary | ICD-10-CM | POA: Diagnosis not present

## 2020-08-13 DIAGNOSIS — H9311 Tinnitus, right ear: Secondary | ICD-10-CM | POA: Diagnosis not present

## 2020-08-17 DIAGNOSIS — I25110 Atherosclerotic heart disease of native coronary artery with unstable angina pectoris: Secondary | ICD-10-CM | POA: Diagnosis not present

## 2020-08-17 DIAGNOSIS — K811 Chronic cholecystitis: Secondary | ICD-10-CM | POA: Diagnosis not present

## 2020-08-17 DIAGNOSIS — E1122 Type 2 diabetes mellitus with diabetic chronic kidney disease: Secondary | ICD-10-CM | POA: Diagnosis not present

## 2020-08-17 DIAGNOSIS — Z9181 History of falling: Secondary | ICD-10-CM | POA: Diagnosis not present

## 2020-08-17 DIAGNOSIS — I255 Ischemic cardiomyopathy: Secondary | ICD-10-CM | POA: Diagnosis not present

## 2020-08-17 DIAGNOSIS — H9311 Tinnitus, right ear: Secondary | ICD-10-CM | POA: Diagnosis not present

## 2020-08-17 DIAGNOSIS — E785 Hyperlipidemia, unspecified: Secondary | ICD-10-CM | POA: Diagnosis not present

## 2020-08-17 DIAGNOSIS — H9193 Unspecified hearing loss, bilateral: Secondary | ICD-10-CM | POA: Diagnosis not present

## 2020-08-17 DIAGNOSIS — N1831 Chronic kidney disease, stage 3a: Secondary | ICD-10-CM | POA: Diagnosis not present

## 2020-08-17 DIAGNOSIS — I252 Old myocardial infarction: Secondary | ICD-10-CM | POA: Diagnosis not present

## 2020-08-17 DIAGNOSIS — K219 Gastro-esophageal reflux disease without esophagitis: Secondary | ICD-10-CM | POA: Diagnosis not present

## 2020-08-17 DIAGNOSIS — I959 Hypotension, unspecified: Secondary | ICD-10-CM | POA: Diagnosis not present

## 2020-08-17 DIAGNOSIS — I130 Hypertensive heart and chronic kidney disease with heart failure and stage 1 through stage 4 chronic kidney disease, or unspecified chronic kidney disease: Secondary | ICD-10-CM | POA: Diagnosis not present

## 2020-08-17 DIAGNOSIS — I5042 Chronic combined systolic (congestive) and diastolic (congestive) heart failure: Secondary | ICD-10-CM | POA: Diagnosis not present

## 2020-08-17 DIAGNOSIS — Z95810 Presence of automatic (implantable) cardiac defibrillator: Secondary | ICD-10-CM | POA: Diagnosis not present

## 2020-08-17 DIAGNOSIS — Z794 Long term (current) use of insulin: Secondary | ICD-10-CM | POA: Diagnosis not present

## 2020-08-17 DIAGNOSIS — Z7901 Long term (current) use of anticoagulants: Secondary | ICD-10-CM | POA: Diagnosis not present

## 2020-08-17 DIAGNOSIS — Z7984 Long term (current) use of oral hypoglycemic drugs: Secondary | ICD-10-CM | POA: Diagnosis not present

## 2020-08-17 DIAGNOSIS — I480 Paroxysmal atrial fibrillation: Secondary | ICD-10-CM | POA: Diagnosis not present

## 2020-08-19 DIAGNOSIS — Z7901 Long term (current) use of anticoagulants: Secondary | ICD-10-CM | POA: Diagnosis not present

## 2020-08-19 DIAGNOSIS — Z9181 History of falling: Secondary | ICD-10-CM | POA: Diagnosis not present

## 2020-08-19 DIAGNOSIS — N1831 Chronic kidney disease, stage 3a: Secondary | ICD-10-CM | POA: Diagnosis not present

## 2020-08-19 DIAGNOSIS — I480 Paroxysmal atrial fibrillation: Secondary | ICD-10-CM | POA: Diagnosis not present

## 2020-08-19 DIAGNOSIS — I959 Hypotension, unspecified: Secondary | ICD-10-CM | POA: Diagnosis not present

## 2020-08-19 DIAGNOSIS — Z794 Long term (current) use of insulin: Secondary | ICD-10-CM | POA: Diagnosis not present

## 2020-08-19 DIAGNOSIS — I5042 Chronic combined systolic (congestive) and diastolic (congestive) heart failure: Secondary | ICD-10-CM | POA: Diagnosis not present

## 2020-08-19 DIAGNOSIS — Z7984 Long term (current) use of oral hypoglycemic drugs: Secondary | ICD-10-CM | POA: Diagnosis not present

## 2020-08-19 DIAGNOSIS — E785 Hyperlipidemia, unspecified: Secondary | ICD-10-CM | POA: Diagnosis not present

## 2020-08-19 DIAGNOSIS — H9193 Unspecified hearing loss, bilateral: Secondary | ICD-10-CM | POA: Diagnosis not present

## 2020-08-19 DIAGNOSIS — K219 Gastro-esophageal reflux disease without esophagitis: Secondary | ICD-10-CM | POA: Diagnosis not present

## 2020-08-19 DIAGNOSIS — K811 Chronic cholecystitis: Secondary | ICD-10-CM | POA: Diagnosis not present

## 2020-08-19 DIAGNOSIS — Z95810 Presence of automatic (implantable) cardiac defibrillator: Secondary | ICD-10-CM | POA: Diagnosis not present

## 2020-08-19 DIAGNOSIS — I255 Ischemic cardiomyopathy: Secondary | ICD-10-CM | POA: Diagnosis not present

## 2020-08-19 DIAGNOSIS — H9311 Tinnitus, right ear: Secondary | ICD-10-CM | POA: Diagnosis not present

## 2020-08-19 DIAGNOSIS — I130 Hypertensive heart and chronic kidney disease with heart failure and stage 1 through stage 4 chronic kidney disease, or unspecified chronic kidney disease: Secondary | ICD-10-CM | POA: Diagnosis not present

## 2020-08-19 DIAGNOSIS — I25110 Atherosclerotic heart disease of native coronary artery with unstable angina pectoris: Secondary | ICD-10-CM | POA: Diagnosis not present

## 2020-08-19 DIAGNOSIS — I252 Old myocardial infarction: Secondary | ICD-10-CM | POA: Diagnosis not present

## 2020-08-19 DIAGNOSIS — E1122 Type 2 diabetes mellitus with diabetic chronic kidney disease: Secondary | ICD-10-CM | POA: Diagnosis not present

## 2020-08-24 ENCOUNTER — Encounter (HOSPITAL_COMMUNITY): Payer: Self-pay

## 2020-08-24 ENCOUNTER — Other Ambulatory Visit: Payer: Self-pay

## 2020-08-24 ENCOUNTER — Ambulatory Visit (HOSPITAL_COMMUNITY)
Admission: RE | Admit: 2020-08-24 | Discharge: 2020-08-24 | Disposition: A | Discharge status: Home / Self Care | Payer: Medicare HMO | Source: Ambulatory Visit | Attending: Cardiology | Admitting: Cardiology

## 2020-08-24 VITALS — BP 106/64 | HR 80 | Wt 176.6 lb

## 2020-08-24 DIAGNOSIS — Z8249 Family history of ischemic heart disease and other diseases of the circulatory system: Secondary | ICD-10-CM | POA: Insufficient documentation

## 2020-08-24 DIAGNOSIS — Z7901 Long term (current) use of anticoagulants: Secondary | ICD-10-CM | POA: Diagnosis not present

## 2020-08-24 DIAGNOSIS — I252 Old myocardial infarction: Secondary | ICD-10-CM | POA: Diagnosis not present

## 2020-08-24 DIAGNOSIS — Z7902 Long term (current) use of antithrombotics/antiplatelets: Secondary | ICD-10-CM | POA: Diagnosis not present

## 2020-08-24 DIAGNOSIS — Z79899 Other long term (current) drug therapy: Secondary | ICD-10-CM | POA: Insufficient documentation

## 2020-08-24 DIAGNOSIS — I951 Orthostatic hypotension: Secondary | ICD-10-CM | POA: Diagnosis not present

## 2020-08-24 DIAGNOSIS — Z794 Long term (current) use of insulin: Secondary | ICD-10-CM | POA: Diagnosis not present

## 2020-08-24 DIAGNOSIS — I5042 Chronic combined systolic (congestive) and diastolic (congestive) heart failure: Secondary | ICD-10-CM | POA: Insufficient documentation

## 2020-08-24 DIAGNOSIS — N183 Chronic kidney disease, stage 3 unspecified: Secondary | ICD-10-CM | POA: Diagnosis not present

## 2020-08-24 DIAGNOSIS — I2510 Atherosclerotic heart disease of native coronary artery without angina pectoris: Secondary | ICD-10-CM | POA: Insufficient documentation

## 2020-08-24 DIAGNOSIS — Z87891 Personal history of nicotine dependence: Secondary | ICD-10-CM | POA: Insufficient documentation

## 2020-08-24 DIAGNOSIS — Z955 Presence of coronary angioplasty implant and graft: Secondary | ICD-10-CM | POA: Diagnosis not present

## 2020-08-24 DIAGNOSIS — Z951 Presence of aortocoronary bypass graft: Secondary | ICD-10-CM | POA: Diagnosis not present

## 2020-08-24 DIAGNOSIS — E1122 Type 2 diabetes mellitus with diabetic chronic kidney disease: Secondary | ICD-10-CM | POA: Diagnosis not present

## 2020-08-24 DIAGNOSIS — E785 Hyperlipidemia, unspecified: Secondary | ICD-10-CM | POA: Diagnosis not present

## 2020-08-24 DIAGNOSIS — I255 Ischemic cardiomyopathy: Secondary | ICD-10-CM | POA: Diagnosis not present

## 2020-08-24 DIAGNOSIS — I480 Paroxysmal atrial fibrillation: Secondary | ICD-10-CM | POA: Diagnosis not present

## 2020-08-24 LAB — BASIC METABOLIC PANEL
Anion gap: 8 (ref 5–15)
BUN: 29 mg/dL — ABNORMAL HIGH (ref 8–23)
CO2: 29 mmol/L (ref 22–32)
Calcium: 9.1 mg/dL (ref 8.9–10.3)
Chloride: 101 mmol/L (ref 98–111)
Creatinine, Ser: 1.74 mg/dL — ABNORMAL HIGH (ref 0.61–1.24)
GFR, Estimated: 40 mL/min — ABNORMAL LOW (ref >60)
Glucose, Bld: 146 mg/dL — ABNORMAL HIGH (ref 70–99)
Potassium: 4.1 mmol/L (ref 3.5–5.1)
Sodium: 138 mmol/L (ref 135–145)

## 2020-08-24 MED ORDER — MIDODRINE HCL 10 MG PO TABS: 10.0000 mg | ORAL_TABLET | Freq: Three times a day (TID) | ORAL | 3 refills | Status: DC

## 2020-08-24 NOTE — Patient Instructions (Signed)
INCREASE Midodrine to 10 mg, one tab three times per day  Labs today We will only contact you if something comes back abnormal or we need to make some changes. Otherwise no news is good news!  Your physician recommends that you schedule a follow-up appointment in: 1-2 weeks with nurse for b/p check  Keep follow up as scheduled with Dr Aundra Dubin in June 2022  Do the following things EVERYDAY: 1) Weigh yourself in the morning before breakfast. Write it down and keep it in a log. 2) Take your medicines as prescribed 3) Eat low salt foods--Limit salt (sodium) to 2000 mg per day.  4) Stay as active as you can everyday 5) Limit all fluids for the day to less than 2 liters  At the [facility], you and your health needs are our priority. As part of our continuing mission to provide you with exceptional heart care, we have created designated Provider Care Teams. These Care Teams include your primary Cardiologist (physician) and Advanced Practice Providers (APPs- Physician Assistants and Nurse Practitioners) who all work together to provide you with the care you need, when you need it.   You may see any of the following providers on your designated Care Team at your next follow up: Marland Kitchen Dr Glori Bickers . Dr [name] . Dr Vickki Muff . Darrick Grinder, NP . Lyda Jester, [place] . Audry Riles, PharmD   Please be sure to bring in all your medications bottles to every appointment.   If you have any questions or concerns before your next appointment please send [place] a message through [place] or call our office at [phone].    TO LEAVE A MESSAGE FOR THE NURSE SELECT OPTION 2, PLEASE LEAVE A MESSAGE INCLUDING: . YOUR NAME . DATE OF BIRTH . CALL BACK NUMBER . REASON FOR CALL**this is important as we prioritize the call backs  YOU WILL RECEIVE A CALL BACK THE SAME DAY AS LONG AS YOU CALL BEFORE 4:00 PM

## 2020-08-24 NOTE — Progress Notes (Signed)
Advanced Heart Failure Clinic Note   Patient ID: [name], male   DOB: 05-09-1940, 80 y.o.   MRN: [id number] PCP: Dr. Lynnda Child Cardiology: Dr Aundra Dubin  87 y.o. with history of CAD s/p CABG and redo CABG as well as ischemic cardiomyopathy with CRT-D device presents for followup of CHF and CAD.  He had a Cardiolite in the 4/15 showing lateral wall ischemia.  LHC at that time showed all his vein grafts from both CABG surgeries occluded.  The LIMA-LAD was patent and there was a 90% distal LM stenosis.  The lateral ischemia likely corresponded to LCx territory downstream from the LM stenosis.  PCI of the distal LM was thought to be high risk and characteristics of the lesion were not favorable for PCI.  Last echo showed EF 25-30% with moderate RV systolic dysfunction.  He had RHC in 11/15 with normal filling pressures and relatively preserved cardiac index (2.55).    In 4/17, he was admitted with chest pain concerning for unstable angina.  Angiography showed 85% distal LM with 90% ostial LCx stenosis.  LIMA was patent but proximal LAD, proximal RCA, and all SVGs were totally occluded. High had successful DES to LM and PTCA to ostial LCx with Impella support.  [name] was stopped and he was put back on low dose lisinopril due to symptomatic hypotension.  He had Cardiolite in 11/17 with scar, no ischemia.   He was admitted in 1/19 with AKI, creatinine up to 3. Meds held then restarted.   Echo 3/19 with EF 30-35%.   He was admitted with syncope and AKI in 6/19.  He was thought to be dehydrated and orthostatic. Losartan stopped but eventually restarted.   He was noted to have prolonged atrial fibrillation by device check in 3/20, now on Eliquis.   Echo was done in 6/20, showing EF 25-30% with inferior/inferolateral AK, moderate LV dilation, mild LVH, mild MR, severe LAE.    CPX in 11/20 showed moderate functional limitation due to HF.   He went into atrial fibrillation in 6/21, had TEE-guided DCCV  in 6/21 back to NSR.  TEE showed EF 25-30%, moderately dilated LV, normal RV.   He was admitted in 4/22 with VT and ICD discharge.  RHC/LHC showed totally occluded SVGs, totally occluded RCA and LAD; the LIMA-LAD was patent and there was a patent LM sent with good flow into the LCx (no changes).  Filling pressure and cardiac output were normal. VT was thought to be scar-mediated.  Amiodarone was begun.  BP was soft in the hospital, meds were cut back.    Since being discharged, he has struggled w/ low BP/ othostasis and weakness/ fatigue. Meds cut back. Most recent f/u appt w/ Dr. Aundra Dubin 08/10/20, he was still hypotensive. Eplerenone and Farxiga discontinued. Instructed to hold lasix x 1 day, then resume. Midodrine 5 tid was added, advised to wear TED hoses and abdominal binder. Ordered to f/u in 2 weeks.    He presents back today for f/u. Here w/ his wife. BP still low and has had hypotension at home. Wife monitors BP. Has had SBPs as low as 70s. Continues w/ positional dizziness. Continues w/ weakness and fatigue. Compliant w/ midodrine but intermittent compliance w/ TED hoses and abdominal binders. Wears at home but does not like to wear in public. Volume status ok on exam and on optivol. Impedence/fuild index ok. No significant dyspnea. No further VT on device interrogation. No AF.    ECG (personally reviewed): not performed  Medtronic device interrogation: Thoracic impedance stable, >98% BiV pacing. No further VT. No AF   Labs (9/15): LDL particle number 816, LDL 57, LFTs normal Labs (10/15): K 4.4, creatinine 1.4 Labs (11/15): K 4.3, creatinine 1.36 Labs (12/15): K 4.8, creatinine 1.34 Labs (3/16): K 4, creatinine 1.38, LDL 46, HDl 35 Labs (7/16): K 4, creatinine 1.39, BNP 211 Labs (03/03/15): K 4.1, creatinine 1.47, BNP 227, LDL 80, HDL 32 Labs (12/16): K 4.6, creatinine 1.12, LDL 68, HDL 34 Labs (4/17): K 3.4, creatinine 1.15 Labs (6/17): K 3.8, creatinine 1.67 Labs (7/17): K 4,  creatinine 1.35 Labs (10/17): K 3.9, creatinine 1.31, LDL 45, HDL 30 Labs (2/18): K 4, creatinine 1.43 Labs (5/18): LDL 44, HDL 27 Labs (6/18): K 4.1, creatinine 1.49 Labs (12/18): K 4.4, creatinine 1.44 Labs (1/19): K 4.3, creatinine 1.48 Labs (2/19): K 3.7, creatinine 1.6 Labs (6/19): K 4, creatinine 1.5 Labs (9/19): K 4.4, creatinine 1.6, hgb 13.4 Labs (10/19): K 4.3, creatinine 1.44 Labs (1/20): K 4, creatinine 1.65 Labs (6/20): K 3.9, creatinine 1.58, LDL 58, HDL 31, hgb 13.3 Labs (7/20): K 3.9, creatinine 1.5 Labs (10/20): K 4, creatinine 1.7 Labs (12/20): K 3.9, creatinine 1.56 Labs (5/21): K 4.1, creatinine 1.7 Labs (8/21): K 4.4, creatinine 1.66, LDL 57 Labs (4/22): creatinine 1.58, hgb 12.3  PMH: 1. Gout 2. Hyperlipidemia 3. CAD: CABG 1997 and redo 11/12.   - LHC (4/15) with totally occluded LAD, totally occluded RCA, 80% distal LM, 50% mLCx, 2 SVG-RCA grafts totally occluded, 2 SVG-OM grafts totally occluded, patent LIMA-LAD.  Cardiolite prior to 4/15 cath showed lateral wall ischemia (LCx territory).  PCI to distal LM would be a high risk procedure.   - Cardiolite (8/16) with EF 20%, severe scar in RCA and probably LCx territory, minimal peri-infarct ischemia.   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  - Unstable angina 4/17: 85% distal LM with 90% ostial LCx stenosis.  LIMA was patent but proximal LAD, proximal RCA, and all SVGs were totally occluded. He had successful DES to LM and PTCA to ostial LCx with Impella support  - Cardiolite (11/17): EF 20%, prior MI, no ischemia.  - LHC (4/22): totally occluded SVGs, totally occluded RCA and LAD; the LIMA-LAD was patent and there was a patent LM sent with good flow into the LCx (no changes) 4. Ischemic Cardiomyopathy: Medtronic CRT-D device.   - Echo (6/15) with EF 25-30%, moderate LV dilation, inferior and inferolateral akinesis, moderately decreased RV systolic function, mild MR.   - RHC  (11/15) with mean RA 5, PA 23/6, mean PCWP 9, CI 2.55.  - Echo (4/17): EF 25-30% with mild MR.  - Echo (11/17): EF 25-30%, moderately dilated LV, grade II diastolic dysfunction, mild-moderate MR, mildly decreased RV systolic function.  - Echo (3/19): EF 30-35%, inferior/inferolateral akinesis, normal RV size with mildly decreased systolic function.  - Echo (6/20): EF 25-30% with inferior/inferolateral AK, moderate LV dilation, mild LVH, mild MR, severe LAE. - CPX (11/20): VO2 max 14.9 (60% predicted), VE/VCO2 slope 34, RER 1.05.  Moderate functional deficit due to HF.  - TEE (6/21): EF 25-30%, moderately dilated LV, normal RV. - RHC (4/22): mean RA 3, PA 23/3, mean PCWP 4, CI 3.13 Fick/3.25 thermo 5. H/o cholecystectomy 6. OA 7. Depression 8. Type II diabetes 9. GERD 10. CKD stage 3 11. ?Early dementia 12. Atrial fibrillation: Paroxysmal.  - DCCV to NSR in 6/21.  13. B12 deficiency.  14. VT in 4/22: Amiodarone  started.   SH: Married, prior smoker (many years ago), lives in [place].    FH: CAD  ROS: All systems reviewed and negative except as per HPI.   Current Outpatient Medications  Medication Sig Dispense Refill  . acetaminophen (TYLENOL) 500 MG tablet Take 1,000 mg by mouth every 6 (six) hours as needed for moderate pain.    Marland Kitchen allopurinol (ZYLOPRIM) 100 MG tablet Take 200 mg by mouth daily.    Marland Kitchen amiodarone (PACERONE) 200 MG tablet Take 1 tablet (200 mg total) by mouth daily. 30 tablet 6  . atorvastatin (LIPITOR) 80 MG tablet TAKE 1 TABLET BY MOUTH ONCE DAILY AT  6PM  IN  THE  EVENING 90 tablet 0  . Continuous Blood Gluc Sensor (FREESTYLE LIBRE 14 DAY SENSOR) MISC USE TO CHECK GLUCOSE THREE TIMES DAILY AS DIRECTED    . ELIQUIS 5 MG TABS tablet Take 1 tablet by mouth twice daily 60 tablet 5  . furosemide (LASIX) 40 MG tablet Take 1.5 tablets (60 mg total) by mouth daily. 135 tablet 3  . Homeopathic Products [facility] ALLERGY EYE RELIEF OP) Place 1 drop into both eyes daily as  needed (irritation).    . Insulin Isophane & Regular Human (NOVOLIN 70/30 FLEXPEN) (70-30) 100 UNIT/ML PEN Inject 40 Units into the skin daily before lunch.     . loperamide (IMODIUM A-D) 2 MG tablet Take 2 mg by mouth 4 (four) times daily as needed for diarrhea or loose stools.    . metoprolol succinate (TOPROL XL) 25 MG 24 hr tablet Take 0.5 tablets (12.5 mg total) by mouth at bedtime. 15 tablet 11  . midodrine (PROAMATINE) 5 MG tablet Take 1 tablet (5 mg total) by mouth 3 (three) times daily with meals. 90 tablet 11  . nitroGLYCERIN (NITROSTAT) 0.4 MG SL tablet Place 1 tablet (0.4 mg total) under the tongue every 5 (five) minutes as needed for chest pain. DO NOT EXCEED A TOTAL OF 3 DOSES IN 15 MINUTES 25 tablet 6  . potassium chloride SA (KLOR-CON) 20 MEQ tablet Take 2 tablets (40 mEq total) by mouth daily. 60 tablet 6  . QUEtiapine (SEROQUEL) 100 MG tablet Take 100 mg by mouth at bedtime. Taking 0.5 tablet at bedtime    . ranolazine (RANEXA) 500 MG 12 hr tablet Take 500 mg by mouth 2 (two) times daily.    Marland Kitchen triamcinolone cream (KENALOG) 0.1 % Apply 1 application topically 2 (two) times daily as needed (for dry/irritated skin.).    Marland Kitchen venlafaxine (EFFEXOR-XR) 150 MG 24 hr capsule Take 150 mg by mouth daily with breakfast.    . vitamin B-12 (CYANOCOBALAMIN) 1000 MCG tablet Take 1,000 mcg by mouth daily.     No current facility-administered medications for this visit.   There were no vitals taken for this visit.   Wt Readings from Last 3 Encounters:  08/10/20 77.7 kg (171 lb 6.4 oz)  07/26/20 80.9 kg (178 lb 5.6 oz)  06/29/20 81.6 kg (179 lb 12.8 oz)    PHYSICAL EXAM: General:  Well appearing, elderly male. No respiratory difficulty HEENT: normal Neck: supple. no JVD. Carotids 2+ bilat; no bruits. No lymphadenopathy or thyromegaly appreciated. Cor: PMI nondisplaced. Regular rate & rhythm. No rubs, gallops or murmurs. Lungs: clear Abdomen: soft, nontender, nondistended. No  hepatosplenomegaly. No bruits or masses. Good bowel sounds. Extremities: no cyanosis, clubbing, rash, edema Neuro: alert & oriented x 3, cranial nerves grossly intact. moves all 4 extremities w/o difficulty. Affect pleasant.   Assessment/Plan: 1. Chronic  systolic CHF: Ischemic cardiomyopathy. He has a Medtronic CRT-D device.  CPX in 11/20 showed moderate functional limitation due to CHF.  TEE in 6/21 with EF 25-30%, normal RV.  RHC in 4/22 showed normal filling pressures and normal cardiac output.  He is not volume overloaded on exam or by Optivol. NYHA class III symptoms, primarily due to weakness/fatigue.  BP remains low. Continues w/ orthostatic symptoms - Continue off eplerenone and [place].   - Continue lasix for volume control tomorrow.  Check BMP today. If SCr/BUN suggestive of dehydration, will reduce to alternating 60 mg one day and 40 mg the next     - Wear graded compression stockings and abdominal binder. Encouraged to improve compliance.  - Increase midodrine to 10 mg tid.  2. CAD: s/p CABG and redo. Had DES to distal LM, angioplasty to ostial LCx in 4/17. Cardiolite 11/17 with scar, no ischemia. LHC in 4/22 showed patent LIMA-LAD and patent LM stent with good flow down LCx, no interventional target.  - Continue statin.  - He is off ASA/Plavix and taking apixaban 5 mg bid.  - Continue ranolazine 1000 mg bid.  3. CKD stage III: last SCr 1.9 - check BMET today.    4. Hyperlipidemia: Continue statin. Good lipids in 3/22.  5. Atrial fibrillation: Paroxysmal atrial fibrillation. No detection on device interrogation today  - Continue apixaban.   - Continue amiodarone 200 mg daily. LFTs and TSH WNL 4/22.  Will need regular eye exam.  6. VT: In 4/22, unchanged coronaries/grafts and optimized filling pressures/cardiac output.  Think scar-mediated.  Amiodarone begun.  No further recurrence on device interrogation today  - Continue amiodarone 200 mg daily.                                 Followup w/ RN in 1 week for repeat BP check (to include orthostatic VS). May need further titration of midodrine to 15 mg tid if orthostatic hypotension continues. Keep f/u w/ Dr. Aundra Dubin next month.    Lyda Jester, PA-C  08/24/2020

## 2020-08-25 DIAGNOSIS — I252 Old myocardial infarction: Secondary | ICD-10-CM | POA: Diagnosis not present

## 2020-08-25 DIAGNOSIS — Z7984 Long term (current) use of oral hypoglycemic drugs: Secondary | ICD-10-CM | POA: Diagnosis not present

## 2020-08-25 DIAGNOSIS — H9311 Tinnitus, right ear: Secondary | ICD-10-CM | POA: Diagnosis not present

## 2020-08-25 DIAGNOSIS — E1122 Type 2 diabetes mellitus with diabetic chronic kidney disease: Secondary | ICD-10-CM | POA: Diagnosis not present

## 2020-08-25 DIAGNOSIS — Z95810 Presence of automatic (implantable) cardiac defibrillator: Secondary | ICD-10-CM | POA: Diagnosis not present

## 2020-08-25 DIAGNOSIS — H9193 Unspecified hearing loss, bilateral: Secondary | ICD-10-CM | POA: Diagnosis not present

## 2020-08-25 DIAGNOSIS — Z7901 Long term (current) use of anticoagulants: Secondary | ICD-10-CM | POA: Diagnosis not present

## 2020-08-25 DIAGNOSIS — I959 Hypotension, unspecified: Secondary | ICD-10-CM | POA: Diagnosis not present

## 2020-08-25 DIAGNOSIS — I25110 Atherosclerotic heart disease of native coronary artery with unstable angina pectoris: Secondary | ICD-10-CM | POA: Diagnosis not present

## 2020-08-25 DIAGNOSIS — Z9181 History of falling: Secondary | ICD-10-CM | POA: Diagnosis not present

## 2020-08-25 DIAGNOSIS — I130 Hypertensive heart and chronic kidney disease with heart failure and stage 1 through stage 4 chronic kidney disease, or unspecified chronic kidney disease: Secondary | ICD-10-CM | POA: Diagnosis not present

## 2020-08-25 DIAGNOSIS — I255 Ischemic cardiomyopathy: Secondary | ICD-10-CM | POA: Diagnosis not present

## 2020-08-25 DIAGNOSIS — I480 Paroxysmal atrial fibrillation: Secondary | ICD-10-CM | POA: Diagnosis not present

## 2020-08-25 DIAGNOSIS — N1831 Chronic kidney disease, stage 3a: Secondary | ICD-10-CM | POA: Diagnosis not present

## 2020-08-25 DIAGNOSIS — K811 Chronic cholecystitis: Secondary | ICD-10-CM | POA: Diagnosis not present

## 2020-08-25 DIAGNOSIS — E785 Hyperlipidemia, unspecified: Secondary | ICD-10-CM | POA: Diagnosis not present

## 2020-08-25 DIAGNOSIS — K219 Gastro-esophageal reflux disease without esophagitis: Secondary | ICD-10-CM | POA: Diagnosis not present

## 2020-08-25 DIAGNOSIS — I5042 Chronic combined systolic (congestive) and diastolic (congestive) heart failure: Secondary | ICD-10-CM | POA: Diagnosis not present

## 2020-08-25 DIAGNOSIS — Z794 Long term (current) use of insulin: Secondary | ICD-10-CM | POA: Diagnosis not present

## 2020-08-27 ENCOUNTER — Encounter: Payer: Self-pay | Admitting: Internal Medicine

## 2020-08-27 ENCOUNTER — Other Ambulatory Visit: Payer: Self-pay

## 2020-08-27 ENCOUNTER — Ambulatory Visit: Payer: Medicare HMO | Admitting: Internal Medicine

## 2020-08-27 VITALS — BP 124/60 | HR 74 | Ht 69.0 in | Wt 178.6 lb

## 2020-08-27 DIAGNOSIS — I5022 Chronic systolic (congestive) heart failure: Secondary | ICD-10-CM | POA: Diagnosis not present

## 2020-08-27 DIAGNOSIS — I472 Ventricular tachycardia, unspecified: Secondary | ICD-10-CM

## 2020-08-27 DIAGNOSIS — E7800 Pure hypercholesterolemia, unspecified: Secondary | ICD-10-CM | POA: Diagnosis not present

## 2020-08-27 DIAGNOSIS — I2510 Atherosclerotic heart disease of native coronary artery without angina pectoris: Secondary | ICD-10-CM

## 2020-08-27 DIAGNOSIS — I255 Ischemic cardiomyopathy: Secondary | ICD-10-CM

## 2020-08-27 DIAGNOSIS — G4700 Insomnia, unspecified: Secondary | ICD-10-CM | POA: Diagnosis not present

## 2020-08-27 DIAGNOSIS — I700 Atherosclerosis of aorta: Secondary | ICD-10-CM | POA: Diagnosis not present

## 2020-08-27 DIAGNOSIS — I480 Paroxysmal atrial fibrillation: Secondary | ICD-10-CM

## 2020-08-27 DIAGNOSIS — E1121 Type 2 diabetes mellitus with diabetic nephropathy: Secondary | ICD-10-CM | POA: Diagnosis not present

## 2020-08-27 DIAGNOSIS — I951 Orthostatic hypotension: Secondary | ICD-10-CM | POA: Diagnosis not present

## 2020-08-27 NOTE — Patient Instructions (Addendum)
Medication Instructions:  Your physician recommends that you continue on your current medications as directed. Please refer to the Current Medication list given to you today.  Labwork: None ordered.  Testing/Procedures: None ordered.  Follow-Up:  Your physician wants you to follow-up in: 11/29/20 at 10:20 am  Legrand Como "Jonni Sanger" Tillery, PA-C   You will receive a reminder letter in the mail two months in advance. If you don't receive a letter, please call our office to schedule the follow-up appointment.  Remote monitoring is used to monitor your Pacemaker of ICD from home. This monitoring reduces the number of office visits required to check your device to one time per year. It allows [place] to keep an eye on the functioning of your device to ensure it is working properly. You are scheduled for a device check from home on 09/20/20. You may send your transmission at any time that day. If you have a wireless device, the transmission will be sent automatically. After your physician reviews your transmission, you will receive a postcard with your next transmission date.  Any Other Special Instructions Will Be Listed Below (If Applicable).  If you need a refill on your cardiac medications before your next appointment, please call your pharmacy.

## 2020-08-27 NOTE — Progress Notes (Signed)
PCP: Mayra Neer, MD Primary Cardiologist: Dr Aundra Dubin Primary EP: Dr Mariane Duval [name] is a 80 y.o. male who presents today for routine electrophysiology followup.  Since I saw him in the hospital in April, the patient reports doing reasonably well.  No further arrhythmias.   He is tolerating amiodarone well.  He has ongoing issues with dizziness, especially with standing.  This was present prior to addition of amiodarone.  He has SOB with activity.  Today, he denies symptoms of palpitations, chest pain,  lower extremity edema, syncope, or further ICD shocks.  The patient is otherwise without complaint today.   Past Medical History:  Diagnosis Date  . CAD (coronary artery disease)    a. s/p CABG 1997 b. PCI (BMS) of SVG to RCA 3/09 c. redo CABG 02/2011 with SVG to PDA, SVG to Lcx d. Cath 07/2013: ischemic cardiomyopathy with LVEF less than 20%, occlusion of the SVG's placed in 2012 e.stenting of LM in 07/2015 with PCI of LCx performed as well  . CHF (congestive heart failure) ([place])   . Chronic systolic dysfunction of left ventricle    EF 30%  . Depression   . DJD (degenerative joint disease)   . DM (diabetes mellitus) ([place]) 02/14/2011  . GERD (gastroesophageal reflux disease)   . Gout   . HTN (hypertension) 02/14/2011  . Hyperlipemia   . Ischemic cardiomyopathy    s/p ICD Implantation by Dr Leonia Reeves  . Paroxysmal atrial fibrillation ([place]) 10/22/2014   single episode of AF x 3 hours 48 minutes recorded on ICD, [id number] score is at least 5   . Renal disorder   . Ventricular tachycardia ([place]) 07/23/2020   appropriate ICD therapy delivered for VT at 222 bpm   Past Surgical History:  Procedure Laterality Date  . BI-VENTRICULAR IMPLANTABLE CARDIOVERTER DEFIBRILLATOR UPGRADE N/A 08/24/2011   Procedure: BI-VENTRICULAR IMPLANTABLE CARDIOVERTER DEFIBRILLATOR UPGRADE;  Surgeon: Evans Lance, MD;  Location: [facility] CATH LAB;  Service: Cardiovascular;  Laterality: N/A;  . BIV ICD GENERATOR  CHANGEOUT N/A 02/13/2018   Procedure: BIV ICD GENERATOR CHANGEOUT;  Surgeon: Thompson Grayer, MD;  Location: [place] CV LAB;  Service: Cardiovascular;  Laterality: N/A;  . CARDIAC CATHETERIZATION  08/03/2015   Procedure: Left Heart Cath and Cors/Grafts Angiography;  Surgeon: Peter M Martinique, MD;  Location: [place] CV LAB;  Service: Cardiovascular;;  . CARDIAC CATHETERIZATION N/A 08/06/2015   Procedure: Coronary Stent Intervention w/Impella;  Surgeon: Jettie Booze, MD;  Location: [place] CV LAB;  Service: Cardiovascular;  Laterality: N/A;  . CARDIAC CATHETERIZATION  08/06/2015   Procedure: Left Heart Cath;  Surgeon: Jettie Booze, MD;  Location: [place] CV LAB;  Service: Cardiovascular;;  . CARDIAC CATHETERIZATION  08/06/2015   Procedure: Coronary Balloon Angioplasty;  Surgeon: Jettie Booze, MD;  Location: [place] CV LAB;  Service: Cardiovascular;;  . CARDIAC DEFIBRILLATOR PLACEMENT  08/2004   initial placement, upgraded to [place] ICD by Dr Lovena Le 08/24/11 (MDT)  . CARDIOVERSION N/A 09/26/2019   Procedure: CARDIOVERSION;  Surgeon: Larey Dresser, MD;  Location: [facility] ENDOSCOPY;  Service: Cardiovascular;  Laterality: N/A;  . CATARACT EXTRACTION W/ INTRAOCULAR LENS  IMPLANT, BILATERAL  2012  . CHOLECYSTECTOMY  01/05/2012   Procedure: LAPAROSCOPIC CHOLECYSTECTOMY WITH INTRAOPERATIVE CHOLANGIOGRAM;  Surgeon: Joyice Faster. Cornett, MD;  Location: [place];  Service: General;  Laterality: N/A;  laparoscopic cholecysectoym with intraoperative cholangiogram  . COLONOSCOPY WITH PROPOFOL N/A 11/18/2013   Procedure: COLONOSCOPY WITH PROPOFOL;  Surgeon: Garlan Fair, MD;  Location: [facility] ENDOSCOPY;  Service: Endoscopy;  Laterality: N/A;  . CORONARY ANGIOPLASTY WITH STENT PLACEMENT  06/2007   BMS to SVG to RCA  . CORONARY ARTERY BYPASS GRAFT  01/1996   CABG x 5 LIMA to LAD SVG to diag1,2,svg to om,svg to RCA  . CORONARY ARTERY BYPASS GRAFT  03/06/2011   CABG X2; Procedure: REDO CORONARY  ARTERY BYPASS GRAFTING (CABG);  Surgeon: Grace Isaac, MD;  Location: [place];  Service: Open Heart Surgery;  Laterality: N/A;  times two grafts using right saphenous vein harvested endoscopically.  Marland Kitchen KNEE ARTHROTOMY  ~ 1978   RIGHT KNEE CARTILAGE REMOVED  . LEFT HEART CATHETERIZATION WITH CORONARY ANGIOGRAM N/A 06/06/2011   Procedure: LEFT HEART CATHETERIZATION WITH CORONARY ANGIOGRAM;  Surgeon: Sueanne Margarita, MD;  Location: [place] CATH LAB;  Service: Cardiovascular;  Laterality: N/A;  . LEFT HEART CATHETERIZATION WITH CORONARY/GRAFT ANGIOGRAM N/A 08/08/2013   Procedure: LEFT HEART CATHETERIZATION WITH Beatrix Fetters;  Surgeon: Sinclair Grooms, MD;  Location[facility] C[facility] LAB;  Service: Cardiovascular;  Laterality: N/A;  . LUMBAR [place] SURGERY  2003  . RIGHT HEART CATHETERIZATION N/A 02/17/2014   Procedure: RIGHT HEART CATH;  Surgeon: Larey Dresser, MD;  Location: [facility] CATH LAB;  Service: Cardiovascular;  Laterality: N/A;  . RIGHT/LEFT HEART CATH AND CORONARY/GRAFT ANGIOGRAPHY N/A 07/26/2020   Procedure: RIGHT/LEFT HEART CATH AND CORONARY/GRAFT ANGIOGRAPHY;  Surgeon: Larey Dresser, MD;  Location: [place] CV LAB;  Service: Cardiovascular;  Laterality: N/A;  . TEE WITHOUT CARDIOVERSION N/A 09/26/2019   Procedure: TRANSESOPHAGEAL ECHOCARDIOGRAM (TEE);  Surgeon: Larey Dresser, MD;  Location: [facility] ENDOSCOPY;  Service: Cardiovascular;  Laterality: N/A;  . VENOGRAM N/A 06/08/2011   Procedure: VENOGRAM;  Surgeon: Thompson Grayer, MD;  Location[facility] CATH LAB;  Service: Cardiovascular;  Laterality: N/A;    ROS- all systems are reviewed and negative except as per HPI above  Current Outpatient Medications  Medication Sig Dispense Refill  . acetaminophen (TYLENOL) 500 MG tablet Take 1,000 mg by mouth every 6 (six) hours as needed for moderate pain.    Marland Kitchen allopurinol (ZYLOPRIM) 100 MG tablet Take 200 mg by mouth daily.    Marland Kitchen amiodarone (PACERONE) 200 MG tablet Take 1 tablet (200 mg total) by mouth  daily. 30 tablet 6  . atorvastatin (LIPITOR) 80 MG tablet TAKE 1 TABLET BY MOUTH ONCE DAILY AT  6PM  IN  THE  EVENING 90 tablet 0  . Continuous Blood Gluc Sensor (FREESTYLE LIBRE 14 DAY SENSOR) MISC USE TO CHECK GLUCOSE THREE TIMES DAILY AS DIRECTED    . ELIQUIS 5 MG TABS tablet Take 1 tablet by mouth twice daily 60 tablet 5  . furosemide (LASIX) 40 MG tablet Take 1.5 tablets (60 mg total) by mouth daily. 135 tablet 3  . Homeopathic Products [facility] ALLERGY EYE RELIEF OP) Place 1 drop into both eyes daily as needed (irritation).    . Insulin Isophane & Regular Human (NOVOLIN 70/30 FLEXPEN) (70-30) 100 UNIT/ML PEN Inject 40 Units into the skin daily before lunch.     . loperamide (IMODIUM A-D) 2 MG tablet Take 2 mg by mouth 4 (four) times daily as needed for diarrhea or loose stools.    . metoprolol succinate (TOPROL XL) 25 MG 24 hr tablet Take 0.5 tablets (12.5 mg total) by mouth at bedtime. 15 tablet 11  . midodrine (PROAMATINE) 10 MG tablet Take 1 tablet (10 mg total) by mouth 3 (three) times daily with meals. 90 tablet 3  .  nitroGLYCERIN (NITROSTAT) 0.4 MG SL tablet Place 1 tablet (0.4 mg total) under the tongue every 5 (five) minutes as needed for chest pain. DO NOT EXCEED A TOTAL OF 3 DOSES IN 15 MINUTES 25 tablet 6  . potassium chloride SA (KLOR-CON) 20 MEQ tablet Take 2 tablets (40 mEq total) by mouth daily. 60 tablet 6  . QUEtiapine (SEROQUEL) 100 MG tablet Take 100 mg by mouth at bedtime. Taking 0.5 tablet at bedtime    . ranolazine (RANEXA) 500 MG 12 hr tablet Take 500 mg by mouth 2 (two) times daily.    Marland Kitchen triamcinolone cream (KENALOG) 0.1 % Apply 1 application topically 2 (two) times daily as needed (for dry/irritated skin.).    Marland Kitchen venlafaxine (EFFEXOR-XR) 150 MG 24 hr capsule Take 150 mg by mouth daily with breakfast.    . vitamin B-12 (CYANOCOBALAMIN) 1000 MCG tablet Take 1,000 mcg by mouth daily.     No current facility-administered medications for this visit.    Physical  Exam: Vitals:   08/27/20 0857  BP: 124/60  Pulse: 74  SpO2: 99%  Weight: 178 lb 9.6 oz (81 kg)  Height: 5\' 9"  (1.753 m)    GEN- The patient is well appearing, alert and oriented x 3 today.   Head- normocephalic, atraumatic Eyes-  Sclera clear, conjunctiva pink Ears- hearing intact Oropharynx- clear Lungs- Clear to ausculation bilaterally, normal work of breathing Chest- ICD pocket is well healed Heart- Regular rate and rhythm, no murmurs, rubs or gallops, PMI not laterally displaced GI- soft, NT, ND, + BS Extremities- no clubbing, cyanosis, or edema  ICD interrogation- reviewed in detail today,  See PACEART report  ekg tracing ordered today is personally reviewed and shows sinus with BiV pacing  Wt Readings from Last 3 Encounters:  08/27/20 178 lb 9.6 oz (81 kg)  08/24/20 176 lb 9.6 oz (80.1 kg)  08/10/20 171 lb 6.4 oz (77.7 kg)    Assessment and Plan:  1.  VT  recently had VT and was admitted 4/22 Started on amiodarone Doing well Continue amiodarone 200mg  daily We will need to follow him closely on this medicine to avoid toxicity TFTs and LFts reviewed from 08/10/20 Normal ICD function See Claudia Desanctis Art report  2. Chronic systolic dysfunction/ CAD/ ischemic CM euvolemic today Recent cath reviewed Stable on an appropriate medical regimen Normal BiV ICD function See Pace Art report Atrial threshold is chronically increased (2V @1  msec). He A paces 50%.  I have reduced lower pacing rate today to 50 bpm he is not device dependant today followed in ICM device clinic  3. Persistent afib [place] <1% Continue eliquis  4. HTN Stable No change required today  5. Stage IIIb renal failure Stable No change required today   Risks, benefits and potential toxicities for medications prescribed and/or refilled reviewed with patient today.   Follow-up with CHF team as scheduled Return to see EP PA in 3 months  Thompson Grayer MD, [facility] 08/27/2020 9:02 AM

## 2020-09-02 DIAGNOSIS — I130 Hypertensive heart and chronic kidney disease with heart failure and stage 1 through stage 4 chronic kidney disease, or unspecified chronic kidney disease: Secondary | ICD-10-CM | POA: Diagnosis not present

## 2020-09-02 DIAGNOSIS — N1831 Chronic kidney disease, stage 3a: Secondary | ICD-10-CM | POA: Diagnosis not present

## 2020-09-02 DIAGNOSIS — H9193 Unspecified hearing loss, bilateral: Secondary | ICD-10-CM | POA: Diagnosis not present

## 2020-09-02 DIAGNOSIS — H9311 Tinnitus, right ear: Secondary | ICD-10-CM | POA: Diagnosis not present

## 2020-09-02 DIAGNOSIS — I959 Hypotension, unspecified: Secondary | ICD-10-CM | POA: Diagnosis not present

## 2020-09-02 DIAGNOSIS — E1122 Type 2 diabetes mellitus with diabetic chronic kidney disease: Secondary | ICD-10-CM | POA: Diagnosis not present

## 2020-09-02 DIAGNOSIS — K219 Gastro-esophageal reflux disease without esophagitis: Secondary | ICD-10-CM | POA: Diagnosis not present

## 2020-09-02 DIAGNOSIS — I480 Paroxysmal atrial fibrillation: Secondary | ICD-10-CM | POA: Diagnosis not present

## 2020-09-02 DIAGNOSIS — I5042 Chronic combined systolic (congestive) and diastolic (congestive) heart failure: Secondary | ICD-10-CM | POA: Diagnosis not present

## 2020-09-02 DIAGNOSIS — I25110 Atherosclerotic heart disease of native coronary artery with unstable angina pectoris: Secondary | ICD-10-CM | POA: Diagnosis not present

## 2020-09-02 NOTE — Progress Notes (Signed)
Remote ICD transmission.   

## 2020-09-07 ENCOUNTER — Other Ambulatory Visit: Payer: Self-pay

## 2020-09-07 ENCOUNTER — Ambulatory Visit (HOSPITAL_COMMUNITY)
Admission: RE | Admit: 2020-09-07 | Discharge: 2020-09-07 | Disposition: A | Discharge status: Home / Self Care | Payer: Medicare HMO | Source: Ambulatory Visit | Attending: Cardiology | Admitting: Cardiology

## 2020-09-07 DIAGNOSIS — Z0130 Encounter for examination of blood pressure without abnormal findings: Secondary | ICD-10-CM | POA: Diagnosis not present

## 2020-09-08 ENCOUNTER — Telehealth (HOSPITAL_COMMUNITY): Payer: Self-pay | Admitting: *Deleted

## 2020-09-08 DIAGNOSIS — I25110 Atherosclerotic heart disease of native coronary artery with unstable angina pectoris: Secondary | ICD-10-CM | POA: Diagnosis not present

## 2020-09-08 DIAGNOSIS — I130 Hypertensive heart and chronic kidney disease with heart failure and stage 1 through stage 4 chronic kidney disease, or unspecified chronic kidney disease: Secondary | ICD-10-CM | POA: Diagnosis not present

## 2020-09-08 DIAGNOSIS — I959 Hypotension, unspecified: Secondary | ICD-10-CM | POA: Diagnosis not present

## 2020-09-08 DIAGNOSIS — N1831 Chronic kidney disease, stage 3a: Secondary | ICD-10-CM | POA: Diagnosis not present

## 2020-09-08 DIAGNOSIS — H9193 Unspecified hearing loss, bilateral: Secondary | ICD-10-CM | POA: Diagnosis not present

## 2020-09-08 DIAGNOSIS — I5042 Chronic combined systolic (congestive) and diastolic (congestive) heart failure: Secondary | ICD-10-CM | POA: Diagnosis not present

## 2020-09-08 DIAGNOSIS — H9311 Tinnitus, right ear: Secondary | ICD-10-CM | POA: Diagnosis not present

## 2020-09-08 DIAGNOSIS — K219 Gastro-esophageal reflux disease without esophagitis: Secondary | ICD-10-CM | POA: Diagnosis not present

## 2020-09-08 DIAGNOSIS — E1122 Type 2 diabetes mellitus with diabetic chronic kidney disease: Secondary | ICD-10-CM | POA: Diagnosis not present

## 2020-09-08 DIAGNOSIS — I480 Paroxysmal atrial fibrillation: Secondary | ICD-10-CM | POA: Diagnosis not present

## 2020-09-08 MED ORDER — MIDODRINE HCL 5 MG PO TABS: 12.5000 mg | ORAL_TABLET | Freq: Three times a day (TID) | ORAL | 3 refills | Status: DC

## 2020-09-08 NOTE — Telephone Encounter (Signed)
Pts wife aware and agreeable with plan.  

## 2020-09-08 NOTE — Telephone Encounter (Signed)
Increase midodrine to 12.5 tid  Encourage regular use of abdominal binder and compression socks   Continue to monitor BP at home. Inform us if orthostatic symptoms persists.   Keep scheduled f/u visit.

## 2020-09-08 NOTE — Telephone Encounter (Signed)
Pt had RN visit yesterday and was orthostatic.  pts wife called back with bp log  5/22 sitting-121/67 heart rate 56     Standing 100/61 heart rate 62  5/23 sitting 110/60  Heart rate 68     Standing 100/60 heart rate 70  5/25 sitting 107/60 heart rate 77     Standing 80/58  Heart rate 68  Routed to [facility] for advice

## 2020-09-09 DIAGNOSIS — Z0100 Encounter for examination of eyes and vision without abnormal findings: Secondary | ICD-10-CM | POA: Diagnosis not present

## 2020-09-09 DIAGNOSIS — H5212 Myopia, left eye: Secondary | ICD-10-CM | POA: Diagnosis not present

## 2020-09-12 ENCOUNTER — Other Ambulatory Visit: Payer: Self-pay | Admitting: Internal Medicine

## 2020-09-12 DIAGNOSIS — I480 Paroxysmal atrial fibrillation: Secondary | ICD-10-CM

## 2020-09-14 NOTE — Telephone Encounter (Signed)
Increase midodrine to 15 mg tid. Get him a followup in APP clinic.

## 2020-09-14 NOTE — Telephone Encounter (Signed)
Pt's age 80, wt 79.9 kg, SCr 1.74, CrCl 39.54, last ov w/ JA 08/27/20.

## 2020-09-14 NOTE — Telephone Encounter (Signed)
pts wife called to report pt is still very weak. Pt couldn't move his legs yesterday and needed wife and son for assistance getting out of the car.Pts wife recorded bp today sitting 122/66 and 83/54 standing.  Wife increased midodrine to 12.5mg  tid as directed by [employer]. Wife asked if we could make med changes or she could contact pts pcp.  Routed to [place]

## 2020-09-14 NOTE — Telephone Encounter (Signed)
Spoke with pts wife she is aware and appt scheduled.

## 2020-09-17 ENCOUNTER — Telehealth: Payer: Self-pay | Admitting: Internal Medicine

## 2020-09-17 NOTE — Telephone Encounter (Signed)
Increase Lasix back to 60 mg daily. BMET 1 week.

## 2020-09-17 NOTE — Telephone Encounter (Signed)
Pt c/o swelling: STAT is pt has developed SOB within 24 hours  1. If swelling, where is the swelling located? Both lower legs  2. How much weight have you gained and in what time span? Not sure  3. Have you gained 3 pounds in a day or 5 pounds in a week? Not sure  4. Do you have a log of your daily weights (if so, list)? no  5. Are you currently taking a fluid pill? yes  6. Are you currently SOB? Not currently with the patient  7. Have you traveled recently? no   [name] from [facility] states a physical therapist has been working with the patient. She states he was making progress and they were going to discharge him but he has had edema and SOB setting him back. She states his swelling is in both lower legs and they would be happy to add nursing in for the patient if given verbal orders. She states the patient is currently taking lasix 40 mg 1 and a half tablets daily and midodrine 10 mg 3x a day. She states to call her back at: [phone], and it is okay to leave a voice mail.

## 2020-09-20 ENCOUNTER — Ambulatory Visit (INDEPENDENT_AMBULATORY_CARE_PROVIDER_SITE_OTHER): Payer: Medicare HMO

## 2020-09-20 DIAGNOSIS — I5022 Chronic systolic (congestive) heart failure: Secondary | ICD-10-CM

## 2020-09-20 DIAGNOSIS — Z95810 Presence of automatic (implantable) cardiac defibrillator: Secondary | ICD-10-CM | POA: Diagnosis not present

## 2020-09-20 NOTE — Telephone Encounter (Signed)
Attempted to call pt and Left message to call back   

## 2020-09-20 NOTE — Telephone Encounter (Signed)
Patients wife, [name] advised and verbalized understanding. Patient has pending appt 6/15 will recheck labs at that time. Med list updated to reflect changes

## 2020-09-21 NOTE — Progress Notes (Signed)
EPIC Encounter for ICM Monitoring  Patient Name: [name] is a 80 y.o. male Date: 09/21/2020 Primary Care Physican: Mayra Neer, MD Primary Cardiologist:[place] Electrophysiologist: Allred Bi-V Pacing: 99.3% 6/7/2022Weight: 176lbs  Time in AT/AF  0.0 hr/day (0.0%)   Spoke with wife and reports swelling in his legs have improved since Dr Aundra Dubin increased Furosemide dosage on 6/3 back to 60 mg daily.  Weight stable.  OptiVolThoracicimpedancesuggesting fluid levels have returned close to baseline.  normal fluid levels.  Prescribed:  Furosemide40 mg 1.5 tablets (60 mg total) by mouth daily.   Potassium 20 mEq take 2 tablets by mouth daily   Labs: 08/24/2020 Creatinine 1.74, BUN 29, Potassium 3.8, Sodium 138, GFR 40 08/10/2020 Creatinine 1.91, BUN 22, Potassium 4.3, Sodium 138, GFR 36 08/03/2020 Creatinine 2.24, BUN 32, Potassium 4.4, Sodium 136, GFR 29 07/27/2020 Creatinine 1.58, BUN 29, Potassium 3.1, Sodium 137, GFR 45  07/26/2020 Creatinine 1.82, BUN 31, Potassium 3.7, Sodium 136, GFR 38  07/25/2020 Creatinine 1.70, BUN 25, Potassium 3.6, Sodium 134, GFR 41  07/24/2020 Creatinine 1.53, BUN 26, Potassium 3.1, Sodium 135, GFR 47  A complete set of results can be found in Results Review.  Recommendations:   No changes and encouraged to call if experiencing any fluid symptoms.  Follow-up plan: ICM clinic phone appointment on6/20/2022 to recheck fluid levels. 91 day device clinic remote transmission7/28/2022.   EP/Cardiology Office Visits:09/29/2020 with Dr Aundra Dubin.11/29/2020 with [name], PA.  Copy of ICM check sent to Dr.Allred.   3 month ICM trend: 09/21/2020.    1 Year ICM trend:       [name], RN 09/21/2020 4:20 PM

## 2020-09-22 DIAGNOSIS — I130 Hypertensive heart and chronic kidney disease with heart failure and stage 1 through stage 4 chronic kidney disease, or unspecified chronic kidney disease: Secondary | ICD-10-CM | POA: Diagnosis not present

## 2020-09-22 DIAGNOSIS — H9193 Unspecified hearing loss, bilateral: Secondary | ICD-10-CM | POA: Diagnosis not present

## 2020-09-22 DIAGNOSIS — I480 Paroxysmal atrial fibrillation: Secondary | ICD-10-CM | POA: Diagnosis not present

## 2020-09-22 DIAGNOSIS — E1122 Type 2 diabetes mellitus with diabetic chronic kidney disease: Secondary | ICD-10-CM | POA: Diagnosis not present

## 2020-09-22 DIAGNOSIS — H9311 Tinnitus, right ear: Secondary | ICD-10-CM | POA: Diagnosis not present

## 2020-09-22 DIAGNOSIS — I5042 Chronic combined systolic (congestive) and diastolic (congestive) heart failure: Secondary | ICD-10-CM | POA: Diagnosis not present

## 2020-09-22 DIAGNOSIS — K219 Gastro-esophageal reflux disease without esophagitis: Secondary | ICD-10-CM | POA: Diagnosis not present

## 2020-09-22 DIAGNOSIS — I959 Hypotension, unspecified: Secondary | ICD-10-CM | POA: Diagnosis not present

## 2020-09-22 DIAGNOSIS — I25110 Atherosclerotic heart disease of native coronary artery with unstable angina pectoris: Secondary | ICD-10-CM | POA: Diagnosis not present

## 2020-09-22 DIAGNOSIS — N1831 Chronic kidney disease, stage 3a: Secondary | ICD-10-CM | POA: Diagnosis not present

## 2020-09-29 ENCOUNTER — Encounter (HOSPITAL_COMMUNITY): Payer: Self-pay | Admitting: Cardiology

## 2020-09-29 ENCOUNTER — Ambulatory Visit (HOSPITAL_COMMUNITY)
Admission: RE | Admit: 2020-09-29 | Discharge: 2020-09-29 | Disposition: A | Discharge status: Home / Self Care | Payer: Medicare HMO | Source: Ambulatory Visit | Attending: Cardiology | Admitting: Cardiology

## 2020-09-29 ENCOUNTER — Other Ambulatory Visit: Payer: Self-pay

## 2020-09-29 VITALS — BP 92/50 | HR 68 | Wt 182.0 lb

## 2020-09-29 DIAGNOSIS — I2510 Atherosclerotic heart disease of native coronary artery without angina pectoris: Secondary | ICD-10-CM | POA: Diagnosis not present

## 2020-09-29 DIAGNOSIS — I252 Old myocardial infarction: Secondary | ICD-10-CM | POA: Insufficient documentation

## 2020-09-29 DIAGNOSIS — Z7901 Long term (current) use of anticoagulants: Secondary | ICD-10-CM | POA: Diagnosis not present

## 2020-09-29 DIAGNOSIS — I480 Paroxysmal atrial fibrillation: Secondary | ICD-10-CM | POA: Insufficient documentation

## 2020-09-29 DIAGNOSIS — I2584 Coronary atherosclerosis due to calcified coronary lesion: Secondary | ICD-10-CM | POA: Diagnosis not present

## 2020-09-29 DIAGNOSIS — Z951 Presence of aortocoronary bypass graft: Secondary | ICD-10-CM | POA: Diagnosis not present

## 2020-09-29 DIAGNOSIS — I2582 Chronic total occlusion of coronary artery: Secondary | ICD-10-CM | POA: Insufficient documentation

## 2020-09-29 DIAGNOSIS — Z87891 Personal history of nicotine dependence: Secondary | ICD-10-CM | POA: Diagnosis not present

## 2020-09-29 DIAGNOSIS — I25810 Atherosclerosis of coronary artery bypass graft(s) without angina pectoris: Secondary | ICD-10-CM | POA: Diagnosis not present

## 2020-09-29 DIAGNOSIS — Z79899 Other long term (current) drug therapy: Secondary | ICD-10-CM | POA: Insufficient documentation

## 2020-09-29 DIAGNOSIS — I255 Ischemic cardiomyopathy: Secondary | ICD-10-CM | POA: Diagnosis not present

## 2020-09-29 DIAGNOSIS — Z8249 Family history of ischemic heart disease and other diseases of the circulatory system: Secondary | ICD-10-CM | POA: Insufficient documentation

## 2020-09-29 DIAGNOSIS — N183 Chronic kidney disease, stage 3 unspecified: Secondary | ICD-10-CM | POA: Insufficient documentation

## 2020-09-29 DIAGNOSIS — I5042 Chronic combined systolic (congestive) and diastolic (congestive) heart failure: Secondary | ICD-10-CM | POA: Diagnosis not present

## 2020-09-29 LAB — COMPREHENSIVE METABOLIC PANEL
ALT: 19 U/L (ref 0–44)
AST: 18 U/L (ref 15–41)
Albumin: 3.5 g/dL (ref 3.5–5.0)
Alkaline Phosphatase: 78 U/L (ref 38–126)
Anion gap: 6 (ref 5–15)
BUN: 23 mg/dL (ref 8–23)
CO2: 30 mmol/L (ref 22–32)
Calcium: 9.1 mg/dL (ref 8.9–10.3)
Chloride: 101 mmol/L (ref 98–111)
Creatinine, Ser: 1.4 mg/dL — ABNORMAL HIGH (ref 0.61–1.24)
GFR, Estimated: 51 mL/min — ABNORMAL LOW (ref >60)
Glucose, Bld: 266 mg/dL — ABNORMAL HIGH (ref 70–99)
Potassium: 4.5 mmol/L (ref 3.5–5.1)
Sodium: 137 mmol/L (ref 135–145)
Total Bilirubin: 0.6 mg/dL (ref 0.3–1.2)
Total Protein: 6.7 g/dL (ref 6.5–8.1)

## 2020-09-29 LAB — TSH: TSH: 1.883 u[IU]/mL (ref 0.350–4.500)

## 2020-09-29 MED ORDER — FUROSEMIDE 40 MG PO TABS: 80.0000 mg | ORAL_TABLET | Freq: Every day | ORAL | 3 refills | Status: DC

## 2020-09-29 NOTE — Progress Notes (Signed)
Advanced Heart Failure Clinic Note   Patient ID: [name], male   DOB: Jan 14, 1941, 80 y.o.   MRN: [id number] PCP: Dr. Lynnda Child Cardiology: Dr Aundra Dubin  12 y.o. with history of CAD s/p CABG and redo CABG as well as ischemic cardiomyopathy with CRT-D device presents for followup of CHF and CAD.  He had a Cardiolite in the 4/15 showing lateral wall ischemia.  LHC at that time showed all his vein grafts from both CABG surgeries occluded.  The LIMA-LAD was patent and there was a 90% distal LM stenosis.  The lateral ischemia likely corresponded to LCx territory downstream from the LM stenosis.  PCI of the distal LM was thought to be high risk and characteristics of the lesion were not favorable for PCI.  Last echo showed EF 25-30% with moderate RV systolic dysfunction.  He had RHC in 11/15 with normal filling pressures and relatively preserved cardiac index (2.55).    In 4/17, he was admitted with chest pain concerning for unstable angina.  Angiography showed 85% distal LM with 90% ostial LCx stenosis.  LIMA was patent but proximal LAD, proximal RCA, and all SVGs were totally occluded. High had successful DES to LM and PTCA to ostial LCx with Impella support.  [name] was stopped and he was put back on low dose lisinopril due to symptomatic hypotension.  He had Cardiolite in 11/17 with scar, no ischemia.   He was admitted in 1/19 with AKI, creatinine up to 3. Meds held then restarted.   Echo 3/19 with EF 30-35%.   He was admitted with syncope and AKI in 6/19.  He was thought to be dehydrated and orthostatic. Losartan stopped but eventually restarted.   He was noted to have prolonged atrial fibrillation by device check in 3/20, now on Eliquis.   Echo was done in 6/20, showing EF 25-30% with inferior/inferolateral AK, moderate LV dilation, mild LVH, mild MR, severe LAE.    CPX in 11/20 showed moderate functional limitation due to HF.   He went into atrial fibrillation in 6/21, had TEE-guided DCCV  in 6/21 back to NSR.  TEE showed EF 25-30%, moderately dilated LV, normal RV.   He was admitted in 4/22 with VT and ICD discharge.  RHC/LHC showed totally occluded SVGs, totally occluded RCA and LAD; the LIMA-LAD was patent and there was a patent LM sent with good flow into the LCx (no changes).  Filling pressure and cardiac output were normal. VT was thought to be scar-mediated.  Amiodarone was begun.  BP was soft in the hospital, meds were cut back.    He returns for followup of CHF and CAD.  He is in NSR today.  BP higher but still has occasional lightheaded spells (less prominent than in the past).  He is now taking midodrine 15 mg tid.  Breathing is "ok."  He is short of breath after walking about 50 feet, generally does ok walking around the house.  No orthopnea/PND.  No chest pain.  His wife reports episodes where he will "freeze up" and get stiff.  He is slow to move.  No obvious tremor. Weight is up about 6 lbs.   ECG (personally reviewed): NSR, BiV pacing  Medtronic device interrogation: Fluid index > threshold with decreased thoracic impedance, no AF, >99% BiV pacing.   Labs (9/15): LDL particle number 816, LDL 57, LFTs normal Labs (10/15): K 4.4, creatinine 1.4 Labs (11/15): K 4.3, creatinine 1.36 Labs (12/15): K 4.8, creatinine 1.34 Labs (3/16): K  4, creatinine 1.38, LDL 46, HDl 35 Labs (7/16): K 4, creatinine 1.39, BNP 211 Labs (03/03/15): K 4.1, creatinine 1.47, BNP 227, LDL 80, HDL 32 Labs (12/16): K 4.6, creatinine 1.12, LDL 68, HDL 34 Labs (4/17): K 3.4, creatinine 1.15 Labs (6/17): K 3.8, creatinine 1.67 Labs (7/17): K 4, creatinine 1.35 Labs (10/17): K 3.9, creatinine 1.31, LDL 45, HDL 30 Labs (2/18): K 4, creatinine 1.43 Labs (5/18): LDL 44, HDL 27 Labs (6/18): K 4.1, creatinine 1.49 Labs (12/18): K 4.4, creatinine 1.44 Labs (1/19): K 4.3, creatinine 1.48 Labs (2/19): K 3.7, creatinine 1.6 Labs (6/19): K 4, creatinine 1.5 Labs (9/19): K 4.4, creatinine 1.6, hgb  13.4 Labs (10/19): K 4.3, creatinine 1.44 Labs (1/20): K 4, creatinine 1.65 Labs (6/20): K 3.9, creatinine 1.58, LDL 58, HDL 31, hgb 13.3 Labs (7/20): K 3.9, creatinine 1.5 Labs (10/20): K 4, creatinine 1.7 Labs (12/20): K 3.9, creatinine 1.56 Labs (5/21): K 4.1, creatinine 1.7 Labs (8/21): K 4.4, creatinine 1.66, LDL 57 Labs (4/22): creatinine 1.58, hgb 12.3, LFTs normal, TSH normal Labs (5/22): K 4.1, creatinine 1.74  PMH: 1. Gout 2. Hyperlipidemia 3. CAD: CABG 1997 and redo 11/12.   - LHC (4/15) with totally occluded LAD, totally occluded RCA, 80% distal LM, 50% mLCx, 2 SVG-RCA grafts totally occluded, 2 SVG-OM grafts totally occluded, patent LIMA-LAD.  Cardiolite prior to 4/15 cath showed lateral wall ischemia (LCx territory).  PCI to distal LM would be a high risk procedure.   - Cardiolite (8/16) with EF 20%, severe scar in RCA and probably LCx territory, minimal peri-infarct ischemia.   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  - Unstable angina 4/17: 85% distal LM with 90% ostial LCx stenosis.  LIMA was patent but proximal LAD, proximal RCA, and all SVGs were totally occluded. He had successful DES to LM and PTCA to ostial LCx with Impella support  - Cardiolite (11/17): EF 20%, prior MI, no ischemia.  - LHC (4/22): totally occluded SVGs, totally occluded RCA and LAD; the LIMA-LAD was patent and there was a patent LM sent with good flow into the LCx (no changes) 4. Ischemic Cardiomyopathy: Medtronic CRT-D device.   - Echo (6/15) with EF 25-30%, moderate LV dilation, inferior and inferolateral akinesis, moderately decreased RV systolic function, mild MR.   - RHC (11/15) with mean RA 5, PA 23/6, mean PCWP 9, CI 2.55.  - Echo (4/17): EF 25-30% with mild MR.  - Echo (11/17): EF 25-30%, moderately dilated LV, grade II diastolic dysfunction, mild-moderate MR, mildly decreased RV systolic function.  - Echo (3/19): EF 30-35%, inferior/inferolateral akinesis,  normal RV size with mildly decreased systolic function.  - Echo (6/20): EF 25-30% with inferior/inferolateral AK, moderate LV dilation, mild LVH, mild MR, severe LAE. - CPX (11/20): VO2 max 14.9 (60% predicted), VE/VCO2 slope 34, RER 1.05.  Moderate functional deficit due to HF.  - TEE (6/21): EF 25-30%, moderately dilated LV, normal RV. - RHC (4/22): mean RA 3, PA 23/3, mean PCWP 4, CI 3.13 Fick/3.25 thermo 5. H/o cholecystectomy 6. OA 7. Depression 8. Type II diabetes 9. GERD 10. CKD stage 3 11. ?Early dementia 12. Atrial fibrillation: Paroxysmal.  - DCCV to NSR in 6/21.  13. B12 deficiency.  14. VT in 4/22: Amiodarone started.   SH: Married, prior smoker (many years ago), lives in [place].    FH: CAD  ROS: All systems reviewed and negative except as per HPI.   Current Outpatient Medications  Medication Sig  Dispense Refill   acetaminophen (TYLENOL) 500 MG tablet Take 1,000 mg by mouth every 6 (six) hours as needed for moderate pain.     allopurinol (ZYLOPRIM) 100 MG tablet Take 200 mg by mouth daily.     amiodarone (PACERONE) 200 MG tablet Take 1 tablet (200 mg total) by mouth daily. 30 tablet 6   atorvastatin (LIPITOR) 80 MG tablet TAKE 1 TABLET BY MOUTH ONCE DAILY AT  6PM  IN  THE  EVENING 90 tablet 0   Continuous Blood Gluc Sensor (FREESTYLE LIBRE 14 DAY SENSOR) MISC USE TO CHECK GLUCOSE THREE TIMES DAILY AS DIRECTED     ELIQUIS 5 MG TABS tablet Take 1 tablet by mouth twice daily 60 tablet 6   insulin aspart protamine- aspart (NOVOLOG MIX 70/30) (70-30) 100 UNIT/ML injection Inject into the skin daily as needed. According to blood sugar     loperamide (IMODIUM A-D) 2 MG tablet Take 2 mg by mouth 4 (four) times daily as needed for diarrhea or loose stools.     metoprolol succinate (TOPROL XL) 25 MG 24 hr tablet Take 0.5 tablets (12.5 mg total) by mouth at bedtime. 15 tablet 11   midodrine (PROAMATINE) 5 MG tablet Take 15 mg by mouth 3 (three) times daily with meals.      nitroGLYCERIN (NITROSTAT) 0.4 MG SL tablet Place 1 tablet (0.4 mg total) under the tongue every 5 (five) minutes as needed for chest pain. DO NOT EXCEED A TOTAL OF 3 DOSES IN 15 MINUTES 25 tablet 6   potassium chloride SA (KLOR-CON) 20 MEQ tablet Take 2 tablets (40 mEq total) by mouth daily. 60 tablet 6   QUEtiapine (SEROQUEL) 100 MG tablet Take 100 mg by mouth at bedtime. Taking 0.5 tablet at bedtime     ranolazine (RANEXA) 500 MG 12 hr tablet Take 500 mg by mouth 2 (two) times daily.     triamcinolone cream (KENALOG) 0.1 % Apply 1 application topically 2 (two) times daily as needed (for dry/irritated skin.).     venlafaxine (EFFEXOR-XR) 150 MG 24 hr capsule Take 150 mg by mouth daily with breakfast.     vitamin B-12 (CYANOCOBALAMIN) 1000 MCG tablet Take 1,000 mcg by mouth daily.     furosemide (LASIX) 40 MG tablet Take 2 tablets (80 mg total) by mouth daily. 180 tablet 3   No current facility-administered medications for this encounter.   BP (!) 92/50   Pulse 68   Wt 82.6 kg (182 lb)   SpO2 97%   BMI 26.88 kg/m    Wt Readings from Last 3 Encounters:  09/29/20 82.6 kg (182 lb)  09/07/20 79.9 kg (176 lb 4 oz)  08/27/20 81 kg (178 lb 9.6 oz)    General: NAD Neck: JVP 8 cm, no thyromegaly or thyroid nodule.  Lungs: Clear to auscultation bilaterally with normal respiratory effort. CV: Nondisplaced PMI.  Heart regular S1/S2, no S3/S4, no murmur.  No peripheral edema.  No carotid bruit.  Normal pedal pulses.  Abdomen: Soft, nontender, no hepatosplenomegaly, no distention.  Skin: Intact without lesions or rashes.  Neurologic: Alert and oriented x 3.  Psych: Normal affect. Extremities: No clubbing or cyanosis.  HEENT: Normal.   Assessment/Plan: 1. Chronic systolic CHF: Ischemic cardiomyopathy. He has a Medtronic CRT-D device.  CPX in 11/20 showed moderate functional limitation due to CHF.  TEE in 6/21 with EF 25-30%, normal RV.  RHC in 4/22 showed normal filling pressures and normal  cardiac output.  Weight is up, mild volume  overload by exam and Optivol.  NYHA class III symptoms.  Orthostatic symptoms improved after cutting back on BP-active meds and starting midodrine.  - Increase Lasix to 80 mg daily. BMET today and in 10 days.         - Wear graded compression stockings and abdominal binder as much as possible.  - Continue midodrine 15 mg tid.  - He can continue Toprol XL 12.5 mg qhs.  2. CAD: s/p CABG and redo. Had DES to distal LM, angioplasty to ostial LCx in 4/17. Cardiolite 11/17 with scar, no ischemia. LHC in 4/22 showed patent LIMA-LAD and patent LM stent with good flow down LCx, no interventional target.  - Continue statin.  - He is off ASA/Plavix and taking apixaban 5 mg bid.  - Continue ranolazine 500 mg bid.  3. CKD stage III: BMET today.    4. Hyperlipidemia: Continue statin. Good lipids in 3/22.  5. Atrial fibrillation: Paroxysmal atrial fibrillation.  NSR today.  - Continue apixaban.   - Continue amiodarone 200 mg daily, check LFTs and TSH today.  Will need regular eye exam.  6. VT: In 4/22, unchanged coronaries/grafts and optimized filling pressures/cardiac output.  Think scar-mediated.  Amiodarone begun.  - Continue amiodarone 200 mg daily.  7. 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  He "freezes up." Slow movement.  No obvious tremor.  - I recommended that he talk to his PCP about a neurology consultation.   Followup 6 wks with APP.    [name] 09/29/2020

## 2020-09-29 NOTE — Patient Instructions (Signed)
Labs done today. We will contact you only if your labs are abnormal.  INCREASE Lasix to 80mg  (2 tablets) by mouth daily.   No other medication changes were made. Please continue all current medications as prescribed.  Your physician recommends that you schedule a follow-up appointment in: 10 days for a lab only appointment and in 6 weeks with our APP Clinic here in our office.  If you have any questions or concerns before your next appointment please send [place] a message through [place] or call our office at [phone].    TO LEAVE A MESSAGE FOR THE NURSE SELECT OPTION 2, PLEASE LEAVE A MESSAGE INCLUDING: YOUR NAME DATE OF BIRTH CALL BACK NUMBER REASON FOR CALL**this is important as we prioritize the call backs  YOU WILL RECEIVE A CALL BACK THE SAME DAY AS LONG AS YOU CALL BEFORE 4:00 PM   Do the following things EVERYDAY: Weigh yourself in the morning before breakfast. Write it down and keep it in a log. Take your medicines as prescribed Eat low salt foods--Limit salt (sodium) to 2000 mg per day.  Stay as active as you can everyday Limit all fluids for the day to less than 2 liters   At the [facility], you and your health needs are our priority. As part of our continuing mission to provide you with exceptional heart care, we have created designated Provider Care Teams. These Care Teams include your primary Cardiologist (physician) and Advanced Practice Providers (APPs- Physician Assistants and Nurse Practitioners) who all work together to provide you with the care you need, when you need it.   You may see any of the following providers on your designated Care Team at your next follow up: Dr Glori Bickers Dr Haynes Kerns, NP Lyda Jester, [place] Audry Riles, PharmD   Please be sure to bring in all your medications bottles to every appointment.

## 2020-10-04 ENCOUNTER — Ambulatory Visit (INDEPENDENT_AMBULATORY_CARE_PROVIDER_SITE_OTHER): Payer: Medicare HMO

## 2020-10-04 DIAGNOSIS — I5042 Chronic combined systolic (congestive) and diastolic (congestive) heart failure: Secondary | ICD-10-CM

## 2020-10-04 DIAGNOSIS — Z95810 Presence of automatic (implantable) cardiac defibrillator: Secondary | ICD-10-CM

## 2020-10-04 NOTE — Progress Notes (Signed)
EPIC Encounter for ICM Monitoring  Patient Name: [name] is a 80 y.o. male Date: 10/04/2020 Primary Care Physican: Mayra Neer, MD Primary Cardiologist: Aundra Dubin Electrophysiologist: Allred Bi-V Pacing:  99.4%    09/21/2020 Weight: 176 lbs   Time in AT/AF  0.0 hr/day (0.0%)         Spoke with wife.  She states patient is doing relatively well and no changes in condition since he had 6/15 OV with Dr Aundra Dubin.       OptiVol Thoracic impedance suggesting possible ongoing fluid accumulation starting 08/16/2020.  Fluid index > threshold starting 08/31/2020.   Prescribed:  Furosemide 40 mg 2 tablets (80 mg total) by mouth daily (increased 09/29/2020) Potassium 20 mEq take 2 tablets by mouth daily    Labs: BMET scheduled for 10/11/2020 09/29/2020 Creatinine 1.40, BUN 23, Potassium 4.5, sodium 137, GFR 51 08/24/2020 Creatinine 1.74, BUN 29, Potassium 3.8, Sodium 138, GFR 40 08/10/2020 Creatinine 1.91, BUN 22, Potassium 4.3, Sodium 138, GFR 36 08/03/2020 Creatinine 2.24, BUN 32, Potassium 4.4, Sodium 136, GFR 29 07/27/2020 Creatinine 1.58, BUN 29, Potassium 3.1, Sodium 137, GFR 45 07/26/2020 Creatinine 1.82, BUN 31, Potassium 3.7, Sodium 136, GFR 38 07/25/2020 Creatinine 1.70, BUN 25, Potassium 3.6, Sodium 134, GFR 41 07/24/2020 Creatinine 1.53, BUN 26, Potassium 3.1, Sodium 135, GFR 47  A complete set of results can be found in Results Review.   Recommendations:   Copy sent to Dr Aundra Dubin for review and recommendations if needed.   Follow-up plan: ICM clinic phone appointment on 10/08/2020 to recheck fluid levels.   91 day device clinic remote transmission 11/11/2020.     EP/Cardiology Office Visits:  11/10/2020 with Dr Aundra Dubin.  11/29/2020 with [name], PA.   Copy of ICM check sent to Dr. Rayann Heman.    3 month ICM trend: 10/04/2020.    1 Year ICM trend:       [name], RN 10/04/2020 12:09 PM

## 2020-10-04 NOTE — Progress Notes (Signed)
He can increase his Lasix to 80 qam/40 qpm x 3 days, then take the pm Lasix every other day.  BMET 1 week.

## 2020-10-05 ENCOUNTER — Other Ambulatory Visit (HOSPITAL_COMMUNITY): Payer: Self-pay | Admitting: Cardiology

## 2020-10-05 MED ORDER — FUROSEMIDE 40 MG PO TABS: ORAL_TABLET | ORAL | 3 refills | Status: DC

## 2020-10-05 NOTE — Progress Notes (Signed)
Spoke with wife per DPR.  Advised Dr Aundra Dubin recommended for patient to take Lasix to 80 qam/40 qpm x 3 days, then take the pm Lasix every other day.  BMET 1 week and and already scheduled for 6/27 by HF clinic.  Wife verbalized understanding and repeated instructions back correctly.  Pt has Furosemide supply on hand and will call for refill when needed.   Next ICM remote transmission date changed from 6/24 to 6/27.

## 2020-10-11 ENCOUNTER — Other Ambulatory Visit: Payer: Self-pay

## 2020-10-11 ENCOUNTER — Ambulatory Visit (HOSPITAL_COMMUNITY)
Admission: RE | Admit: 2020-10-11 | Discharge: 2020-10-11 | Disposition: A | Discharge status: Home / Self Care | Payer: Medicare HMO | Source: Ambulatory Visit | Attending: Internal Medicine | Admitting: Internal Medicine

## 2020-10-11 ENCOUNTER — Ambulatory Visit (INDEPENDENT_AMBULATORY_CARE_PROVIDER_SITE_OTHER): Payer: Medicare HMO

## 2020-10-11 DIAGNOSIS — I5042 Chronic combined systolic (congestive) and diastolic (congestive) heart failure: Secondary | ICD-10-CM

## 2020-10-11 DIAGNOSIS — Z95810 Presence of automatic (implantable) cardiac defibrillator: Secondary | ICD-10-CM

## 2020-10-11 LAB — BASIC METABOLIC PANEL
Anion gap: 9 (ref 5–15)
BUN: 30 mg/dL — ABNORMAL HIGH (ref 8–23)
CO2: 30 mmol/L (ref 22–32)
Calcium: 9.1 mg/dL (ref 8.9–10.3)
Chloride: 99 mmol/L (ref 98–111)
Creatinine, Ser: 1.75 mg/dL — ABNORMAL HIGH (ref 0.61–1.24)
GFR, Estimated: 39 mL/min — ABNORMAL LOW (ref >60)
Glucose, Bld: 181 mg/dL — ABNORMAL HIGH (ref 70–99)
Potassium: 4.2 mmol/L (ref 3.5–5.1)
Sodium: 138 mmol/L (ref 135–145)

## 2020-10-12 ENCOUNTER — Telehealth: Payer: Self-pay

## 2020-10-12 NOTE — Telephone Encounter (Signed)
Remote ICM transmission received.  Attempted call to wife regarding ICM remote transmission and left detailed message per DPR.  Advised to return call for any fluid symptoms or questions. Next ICM remote transmission scheduled 10/25/2020.

## 2020-10-12 NOTE — Progress Notes (Signed)
EPIC Encounter for ICM Monitoring  Patient Name: WILLIARD KELLER is a 80 y.o. male Date: 10/12/2020 Primary Care Physican: Mayra Neer, MD Primary Cardiologist: Aundra Dubin Electrophysiologist: Allred Bi-V Pacing:  99.4%    09/21/2020 Weight: 176 lbs   Time in AT/AF  0.0 hr/day (0.0%)         Attempted call to wife and unable to reach.  Left detailed message per DPR regarding transmission. Transmission reviewed.        OptiVol Thoracic impedance suggesting fluid levels returned to normal after Fursoemide increase but starting to trend down again.  Fluid index > threshold starting 08/31/2020.   Prescribed:  Furosemide 40 mg Take 2 tablets (80 mg total) every morning and 1 tablet (40 mg total) every other evening. Potassium 20 mEq take 2 tablets by mouth daily    Labs:  10/11/2020 Creatinine 1.75, BUN 30, Potassium 4.2, Sodium 138, GFR 39 09/29/2020 Creatinine 1.40, BUN 23, Potassium 4.5, sodium 137, GFR 51 08/24/2020 Creatinine 1.74, BUN 29, Potassium 3.8, Sodium 138, GFR 40 08/10/2020 Creatinine 1.91, BUN 22, Potassium 4.3, Sodium 138, GFR 36 08/03/2020 Creatinine 2.24, BUN 32, Potassium 4.4, Sodium 136, GFR 29 07/27/2020 Creatinine 1.58, BUN 29, Potassium 3.1, Sodium 137, GFR 45 07/26/2020 Creatinine 1.82, BUN 31, Potassium 3.7, Sodium 136, GFR 38 07/25/2020 Creatinine 1.70, BUN 25, Potassium 3.6, Sodium 134, GFR 41 07/24/2020 Creatinine 1.53, BUN 26, Potassium 3.1, Sodium 135, GFR 47  A complete set of results can be found in Results Review.   Recommendations:   Left voice mail with ICM number and encouraged to call if experiencing any fluid symptoms.   Follow-up plan: ICM clinic phone appointment on 10/25/2020.   91 day device clinic remote transmission 11/11/2020.     EP/Cardiology Office Visits:  11/10/2020 with Dr Aundra Dubin.  11/29/2020 with [name], PA.   Copy of ICM check sent to Dr. Rayann Heman.    3 month ICM trend: 10/11/2020.    1 Year ICM trend:       [name],  RN 10/12/2020 3:52 PM

## 2020-10-13 DIAGNOSIS — R2689 Other abnormalities of gait and mobility: Secondary | ICD-10-CM | POA: Diagnosis not present

## 2020-10-13 DIAGNOSIS — R270 Ataxia, unspecified: Secondary | ICD-10-CM | POA: Diagnosis not present

## 2020-10-13 DIAGNOSIS — R296 Repeated falls: Secondary | ICD-10-CM | POA: Diagnosis not present

## 2020-10-13 DIAGNOSIS — R4789 Other speech disturbances: Secondary | ICD-10-CM | POA: Diagnosis not present

## 2020-10-15 ENCOUNTER — Encounter: Payer: Self-pay | Admitting: Physical Therapy

## 2020-10-15 ENCOUNTER — Other Ambulatory Visit: Payer: Self-pay

## 2020-10-15 ENCOUNTER — Ambulatory Visit: Payer: Medicare HMO | Attending: Family Medicine | Admitting: Physical Therapy

## 2020-10-15 VITALS — BP 130/68 | HR 63

## 2020-10-15 DIAGNOSIS — M6281 Muscle weakness (generalized): Secondary | ICD-10-CM | POA: Insufficient documentation

## 2020-10-15 DIAGNOSIS — Z9181 History of falling: Secondary | ICD-10-CM | POA: Diagnosis not present

## 2020-10-15 DIAGNOSIS — R2689 Other abnormalities of gait and mobility: Secondary | ICD-10-CM | POA: Diagnosis not present

## 2020-10-15 DIAGNOSIS — R2681 Unsteadiness on feet: Secondary | ICD-10-CM

## 2020-10-15 NOTE — Therapy (Addendum)
[facility] [address] [place], [place], [address] Phone: [phone]   Fax:  [phone]  Physical Therapy Evaluation  Patient Details  Name: [name] MRN: [id number] Date of Birth: 08-Jan-1941 Referring Provider (PT): Mayra Neer, MD   Encounter Date: 10/15/2020   PT End of Session - 10/15/20 1638     Visit Number 1    Number of Visits 9    Date for PT Re-Evaluation 01/13/21    Authorization Type [employer]- $35 co pay    PT Start Time 1231    PT Stop Time 1315    PT Time Calculation (min) 44 min    Equipment Utilized During Treatment Gait belt    Activity Tolerance Patient tolerated treatment well    Behavior During Therapy [facility] for tasks assessed/performed             Past Medical History:  Diagnosis Date   CAD (coronary artery disease)    a. s/p CABG 1997 b. PCI (BMS) of SVG to RCA 3/09 c. redo CABG 02/2011 with SVG to PDA, SVG to Lcx d. Cath 07/2013: ischemic cardiomyopathy with LVEF less than 20%, occlusion of the SVG's placed in 2012 e.stenting of LM in 07/2015 with PCI of LCx performed as well   CHF (congestive heart failure) (HCC)    Chronic systolic dysfunction of left ventricle    EF 30%   Depression    DJD (degenerative joint disease)    DM (diabetes mellitus) ([place]) 02/14/2011   GERD (gastroesophageal reflux disease)    Gout    HTN (hypertension) 02/14/2011   Hyperlipemia    Ischemic cardiomyopathy    s/p ICD Implantation by Dr Leonia Reeves   Paroxysmal atrial fibrillation ([place]) 10/22/2014   single episode of AF x 3 hours 48 minutes recorded on ICD, [email or web address] score is at least 5    Renal disorder    Ventricular tachycardia ([place]) 07/23/2020   appropriate ICD therapy delivered for VT at 222 bpm    Past Surgical History:  Procedure Laterality Date   BI-VENTRICULAR IMPLANTABLE CARDIOVERTER DEFIBRILLATOR UPGRADE N/A 08/24/2011   Procedure: BI-VENTRICULAR IMPLANTABLE CARDIOVERTER  DEFIBRILLATOR UPGRADE;  Surgeon: Evans Lance, MD;  Location: [facility] CATH LAB;  Service: Cardiovascular;  Laterality: N/A;   BIV ICD GENERATOR CHANGEOUT N/A 02/13/2018   Procedure: BIV ICD GENERATOR CHANGEOUT;  Surgeon: Thompson Grayer, MD;  Location: [facility] CV LAB;  Service: Cardiovascular;  Laterality: N/A;   CARDIAC CATHETERIZATION  08/03/2015   Procedure: Left Heart Cath and Cors/Grafts Angiography;  Surgeon: Peter M Martinique, MD;  Location: [facility] [place] CV LAB;  Service: Cardiovascular;;   CARDIAC CATHETERIZATION N/A 08/06/2015   Procedure: Coronary Stent Intervention w/Impella;  Surgeon: Jettie Booze, MD;  Location: [place] CV LAB;  Service: Cardiovascular;  Laterality: N/A;   CARDIAC CATHETERIZATION  08/06/2015   Procedure: Left Heart Cath;  Surgeon: Jettie Booze, MD;  Location: [facility] CV LAB;  Service: Cardiovascular;;   CARDIAC CATHETERIZATION  08/06/2015   Procedure: Coronary Balloon Angioplasty;  Surgeon: Jettie Booze, MD;  Location: [facility] CV LAB;  Service: Cardiovascular;;   CARDIAC DEFIBRILLATOR PLACEMENT  08/2004   initial placement, upgraded to [place] ICD by Dr Lovena Le 08/24/11 (MDT)   CARDIOVERSION N/A 09/26/2019   Procedure: CARDIOVERSION;  Surgeon: Larey Dresser, MD;  Location: [facility] ENDOSCOPY;  Service: Cardiovascular;  Laterality: N/A;   CATARACT EXTRACTION W/ INTRAOCULAR LENS  IMPLANT, BILATERAL  2012   CHOLECYSTECTOMY  01/05/2012   Procedure:  LAPAROSCOPIC CHOLECYSTECTOMY WITH INTRAOPERATIVE CHOLANGIOGRAM;  Surgeon: Joyice Faster. Cornett, MD;  Location: [facility];  Service: General;  Laterality: N/A;  laparoscopic cholecysectoym with intraoperative cholangiogram   COLONOSCOPY WITH PROPOFOL N/A 11/18/2013   Procedure: COLONOSCOPY WITH PROPOFOL;  Surgeon: Garlan Fair, MD;  Location: [facility] ENDOSCOPY;  Service: Endoscopy;  Laterality: N/A;   CORONARY ANGIOPLASTY WITH STENT PLACEMENT  06/2007   BMS to SVG to RCA   CORONARY ARTERY BYPASS GRAFT  01/1996   CABG x 5  LIMA to LAD SVG to diag1,2,svg to om,svg to RCA   CORONARY ARTERY BYPASS GRAFT  03/06/2011   CABG X2; Procedure: REDO CORONARY ARTERY BYPASS GRAFTING (CABG);  Surgeon: Grace Isaac, MD;  Location: [facility];  Service: Open Heart Surgery;  Laterality: N/A;  times two grafts using right saphenous vein harvested endoscopically.   KNEE ARTHROTOMY  ~ 1978   RIGHT KNEE CARTILAGE REMOVED   LEFT HEART CATHETERIZATION WITH CORONARY ANGIOGRAM N/A 06/06/2011   Procedure: LEFT HEART CATHETERIZATION WITH CORONARY ANGIOGRAM;  Surgeon: Sueanne Margarita, MD;  Location: [facility] CATH LAB;  Service: Cardiovascular;  Laterality: N/A;   LEFT HEART CATHETERIZATION WITH CORONARY/GRAFT ANGIOGRAM N/A 08/08/2013   Procedure: LEFT HEART CATHETERIZATION WITH Beatrix Fetters;  Surgeon: Sinclair Grooms, MD;  Location: [facility] CATH LAB;  Service: Cardiovascular;  Laterality: N/A;   LUMBAR DISC SURGERY  2003   RIGHT HEART CATHETERIZATION N/A 02/17/2014   Procedure: RIGHT HEART CATH;  Surgeon: Larey Dresser, MD;  Location: [facility] C[facility] LAB;  Service: Cardiovascular;  Laterality: N/A;   RIGHT/LEFT HEART CATH AND CORONARY/GRAFT ANGIOGRAPHY N/A 07/26/2020   Procedure: RIGHT/LEFT HEART CATH AND CORONARY/GRAFT ANGIOGRAPHY;  Surgeon: Larey Dresser, MD;  Location: [place] CV LAB;  Service: Cardiovascular;  Laterality: N/A;   TEE WITHOUT CARDIOVERSION N/A 09/26/2019   Procedure: TRANSESOPHAGEAL ECHOCARDIOGRAM (TEE);  Surgeon: Larey Dresser, MD;  Location: [facility] ENDOSCOPY;  Service: Cardiovascular;  Laterality: N/A;   VENOGRAM N/A 06/08/2011   Procedure: VENOGRAM;  Surgeon: Thompson Grayer, MD;  Location: [facility] CATH LAB;  Service: Cardiovascular;  Laterality: N/A;    Vitals:   10/15/20 1237  BP: 130/68  Pulse: 63      Subjective Assessment - 10/15/20 1238     Subjective Feels like he can't keep his balance that good. [place] the day before yesterday was on the lawn mover, got up too quickly and fell down on rocks. Had to have his wife and  a neighbor help him out. Has a hx of orthostatics, cardiologist is aware. Has been feeling more off balance in the past 3-4 months (when he started using the cane). Had another fall in the house when he got up too quickly. Reports feeling dizzy at times (described as lightheadedness). Will be seeing a neurologist in the beginning of september.    Patient is accompained by: Family member   wife   Pertinent History PMH: chronic systolic CHF, CAD s/p CABG and redo, CKD stage III, HLD, a fib, DM, implantable cardioverter-defibrillator    Limitations Walking;House hold activities;Standing    Patient Stated Goals improve the balance - does not want to use a cane.    Currently in Pain? No/denies                [facility] PT Assessment - 10/15/20 1245       Assessment   Medical Diagnosis falls/imbalance    Referring Provider (PT) Mayra Neer, MD    Onset Date/Surgical Date 10/14/20    Hand Dominance  Left    Prior Therapy previous cardiac rehab, HHPT      Precautions   Precautions Fall    Precaution Comments defibrillator, hard of hearing, no driving      Balance Screen   Has the patient fallen in the past 6 months Yes    How many times? 4 or 5    Has the patient had a decrease in activity level because of a fear of falling?  Yes    Is the patient reluctant to leave their home because of a fear of falling?  No      Home Environment   Living Environment Private residence    Living Arrangements Spouse/significant other    Type of [place] to enter    Entrance Stairs-Number of Steps 1    Entrance Stairs-Rails None    Home Layout One level    [place] - single point;Grab bars - toilet;Grab bars - tub/shower;Other (comment);Shower seat;Walker - Psychologist, educational - 4 wheels   handicap toilet, step in shower   Additional Comments wife helps with dressing, supervision when bathing, medication      Prior Function   Level of Independence Independent   prior  to balance impairments in past 3-4 months   Leisure mowing grass, gardening      Sensation   Light Touch Impaired Detail    Light Touch Impaired Details Impaired LLE;Impaired RLE   harder to detect at B ankles   Additional Comments numbness comes and goes in feet due to diabetes      Coordination   Gross Motor Movements are Fluid and Coordinated No    Heel Shin Test harder to perform with LLE      Posture/Postural Control   Posture/Postural Control Postural limitations    Postural Limitations Rounded Shoulders;Forward head      ROM / Strength   AROM / PROM / Strength Strength      Strength   Strength Assessment Site Hip;Knee;Ankle    Right/Left Hip Right;Left    Right Hip Flexion 4+/5    Left Hip Flexion 4+/5    Right/Left Knee Left;Right    Right Knee Flexion 5/5    Right Knee Extension 4+/5    Left Knee Flexion 5/5    Left Knee Extension 4+/5    Right/Left Ankle Right;Left    Right Ankle Dorsiflexion 4-/5    Left Ankle Dorsiflexion 4-/5      Transfers   Transfers Sit to Stand;Stand to Sit    Sit to Stand 5: Supervision;With upper extremity assist    Five time sit to stand comments  26.38 seconds, incr forward flexed posture in standing    Stand to Sit 5: Supervision      Ambulation/Gait   Ambulation/Gait Yes    Ambulation/Gait Assistance 5: Supervision;4: Min guard    Ambulation/Gait Assistance Details pt holding SPC at times    Assistive device Straight cane    Gait Pattern Step-through pattern;Decreased step length - right;Decreased step length - left;Decreased arm swing - left;Decreased hip/knee flexion - right;Decreased hip/knee flexion - left;Trunk flexed    Ambulation Surface Level;Indoor    Gait velocity 17.78 seconds = 1.84 ft/sec      Standardized Balance Assessment   Standardized Balance Assessment Timed Up and Go Test;Berg Balance Test      Berg Balance Test   Sit to Stand Able to stand without using hands and stabilize independently    Standing  Unsupported Able to  stand safely 2 minutes    Sitting with Back Unsupported but Feet Supported on Floor or Stool Able to sit safely and securely 2 minutes    Standing Unsupported with Eyes Closed Able to stand 10 seconds with supervision    Standing Unsupported with Feet Together Able to place feet together independently and stand 1 minute safely      Timed Up and Go Test   Normal TUG (seconds) 15.75    TUG Comments with SPC      High Level Balance   High Level Balance Comments push and release test: anterior = unable to ellicit a stepping strategy, posterior = delayed 2 small steps                        Objective measurements completed on examination: See above findings.               PT Education - 10/15/20 1638     Education Details clinical findings, POC    Person(s) Educated Patient;Spouse    Methods Explanation    Comprehension Verbalized understanding             PT Short Term Goals - 10/19/20 1040       PT SHORT TERM GOAL #1   Title Pt will finish assessment of BERG with LTG written as appropriate. ALL STGS DUE 12/14/20    Baseline did not have time to finish at eval.    Time 8    Period Weeks    Status New    Target Date 12/14/20      PT SHORT TERM GOAL #2   Title Pt and pt's spouse will verbalize understanding of fall prevention in the home.    Time 4    Period Weeks    Status New      PT SHORT TERM GOAL #3   Title Pt will decr 5x sit <> stand time to 23 seconds or less with BUE support in order to demo improved functional BLE strength for transfers and decr fall risk.    Baseline 26.38 seconds with BUE support.    Time 4    Period Weeks    Status New      PT SHORT TERM GOAL #4   Title Pt will improve gait speed to at least 2.0 ft/sec with LRAD in order to demo improved community mobility.    Baseline 1.84 ft/sec    Time 4    Period Weeks    Status New                PT Long Term Goals - 10/19/20 1043       PT  LONG TERM GOAL #1   Title Pt will be independent with final HEP with wife's supervision in order to build upon functional gains made in therapy. ALL LTGS DUE 12/14/20    Time 8    Period Weeks    Status New    Target Date 12/14/20      PT LONG TERM GOAL #2   Title BERG goal to be written as appropriate in order to demo decr fall risk.    Baseline not yet assessed.    Time 8    Period Weeks    Status New      PT LONG TERM GOAL #3   Title Pt will decr 5x sit <> stand time to 18 seconds or less  with BUE support in order to demo decr fall  risk.    Baseline 26.38 seconds with BUE support.    Time 8    Period Weeks    Status New      PT LONG TERM GOAL #4   Title Pt will recover posterior and anterior balance in push and release test in 2 or less robust steps independently, for improved balance recovery    Baseline anterior = unable to ellicit, posterior = 2 delayed small steps.    Time 8    Period Weeks    Status New      PT LONG TERM GOAL #5   Title Pt will improve gait speed to at least 2.85ft/sec with LRAD in order to demo improved community mobility.    Baseline 1.84 ft/sec with SPC.    Time 8    Period Weeks    Status New                     10/19/20 1046  Plan  Clinical Impression Statement Patient is a 80 year old male referred to Neuro OPPT for falls/imbalance.  Pt has had approx. 4-5 falls in the past 6 months. Pt's PMH is significant for: chronic systolic CHF, CAD, s/p CABG and redo, CKD stage III, HLD, a fib, DM. The following deficits were present during the exam: decr strength, impaired balance, decr coordination, impaired sensation, postural abnormalities, gait abnormalities, decr knowledge of proper use of AD. Based on TUG and 5x sit <> stand, pt is an incr risk for falls. Pt's gait speed indicates that pt is a limited community ambulator. Pt would benefit from skilled PT to address these impairments and functional limitations to maximize functional mobility  independence and decr fall risk.  Personal Factors and Comorbidities Past/Current Experience;Comorbidity 3+  Comorbidities chronic systolic CHF, CAD s/p CABG and redo, CKD stage III, HLD, a fib, DM  Examination-Activity Limitations Stairs;Transfers;Locomotion Level;Squat  Examination-Participation Restrictions Community Activity;Driving;Yard Work  Pt will benefit from skilled therapeutic intervention in order to improve on the following deficits Abnormal gait;Decreased activity tolerance;Decreased coordination;Decreased balance;Decreased endurance;Decreased strength;Impaired sensation;Postural dysfunction;Decreased knowledge of use of DME  Stability/Clinical Decision Making Evolving/Moderate complexity  Clinical Decision Making Moderate  Rehab Potential Good  PT Frequency 1x / week  PT Duration 12 weeks (8 visits over 12 weeks)  PT Treatment/Interventions ADLs/Self Care Home Management;DME Instruction;Gait training;Stair training;Functional mobility training;Therapeutic activities;Neuromuscular re-education;Balance training;Therapeutic exercise;Patient/family education;Vestibular  PT Next Visit Plan finish checking BERG.. initial HEP for balance/ functional BLE strength. work on stepping strategies, gait training with SPC.  Consulted and Agree with Plan of Care Patient;Family member/caregiver  Family Member Consulted spouse, [name]       Patient will benefit from skilled therapeutic intervention in order to improve the following deficits and impairments:     Visit Diagnosis: Unsteadiness on feet  History of falling  Muscle weakness (generalized)  Other abnormalities of gait and mobility     Problem List Patient Active Problem List   Diagnosis Date Noted   Ventricular fibrillation ([place]) 07/23/2020   Syncope and collapse 09/24/2017   GERD (gastroesophageal reflux disease) 09/24/2017   Type 2 diabetes mellitus ([place]) 09/24/2017   Chronic combined systolic and diastolic CHF  (congestive heart failure) ([place]) 09/24/2017   Acute renal failure superimposed on stage 3 chronic kidney disease ([place]) 09/24/2017   Syncope    Acute kidney injury ([place]) 05/01/2017   Confusion 05/01/2017   NSTEMI (non-ST elevated myocardial infarction) ([place])    Unstable angina (HCC)    Chest pain with high  risk of acute coronary syndrome 08/01/2015   CKD (chronic kidney disease) stage 3, GFR 30-59 ml/min (HCC) 06/14/2015   Angina pectoris ([place]) 02/17/2015   Paroxysmal atrial fibrillation ([place]) 01/11/2015   Hypersomnia 97/47/1855   Chronic systolic CHF (congestive heart failure) ([place]) 12/12/2012   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 [place], [place], [address] Phone: [phone]   Fax:  [phone]  Name: [name] MRN: [id number] Date of Birth: 01-Dec-1940

## 2020-10-19 NOTE — Addendum Note (Signed)
Addended by: Arliss Journey on: 10/19/2020 10:53 AM   Modules accepted: Orders

## 2020-10-25 ENCOUNTER — Other Ambulatory Visit (HOSPITAL_COMMUNITY): Payer: Self-pay | Admitting: *Deleted

## 2020-10-25 ENCOUNTER — Ambulatory Visit (INDEPENDENT_AMBULATORY_CARE_PROVIDER_SITE_OTHER): Payer: Medicare HMO

## 2020-10-25 DIAGNOSIS — Z95810 Presence of automatic (implantable) cardiac defibrillator: Secondary | ICD-10-CM

## 2020-10-25 DIAGNOSIS — I5042 Chronic combined systolic (congestive) and diastolic (congestive) heart failure: Secondary | ICD-10-CM

## 2020-10-25 MED ORDER — MIDODRINE HCL 10 MG PO TABS: 15.0000 mg | ORAL_TABLET | Freq: Three times a day (TID) | ORAL | 3 refills | Status: DC

## 2020-10-25 NOTE — Progress Notes (Signed)
EPIC Encounter for ICM Monitoring  Patient Name: [name] is a 80 y.o. male Date: 10/25/2020 Primary Care Physican: Mayra Neer, MD Primary Cardiologist: Aundra Dubin Electrophysiologist: Allred Bi-V Pacing:  99.4%    10/25/2020 Weight: 174-176 lbs   Time in AT/AF  0.0 hr/day (0.0%)         Spoke with wife and heart failure questions reviewed.  Pt asymptomatic for fluid accumulation and feeing well.  She thinks patient's fluid intake is >64 oz for the past few days.       Patient is having some dizziness and balance issues which has been discussed with Dr Brigitte Pulse.       OptiVol Thoracic impedance trending slightly beloow baseline normal.   Prescribed:  Furosemide 40 mg Take 2 tablets (80 mg total) every morning and 1 tablet (40 mg total) every other evening. Potassium 20 mEq take 2 tablets by mouth daily    Labs:  10/11/2020 Creatinine 1.75, BUN 30, Potassium 4.2, Sodium 138, GFR 39 09/29/2020 Creatinine 1.40, BUN 23, Potassium 4.5, sodium 137, GFR 51 08/24/2020 Creatinine 1.74, BUN 29, Potassium 3.8, Sodium 138, GFR 40 08/10/2020 Creatinine 1.91, BUN 22, Potassium 4.3, Sodium 138, GFR 36 08/03/2020 Creatinine 2.24, BUN 32, Potassium 4.4, Sodium 136, GFR 29 07/27/2020 Creatinine 1.58, BUN 29, Potassium 3.1, Sodium 137, GFR 45 07/26/2020 Creatinine 1.82, BUN 31, Potassium 3.7, Sodium 136, GFR 38 07/25/2020 Creatinine 1.70, BUN 25, Potassium 3.6, Sodium 134, GFR 41 07/24/2020 Creatinine 1.53, BUN 26, Potassium 3.1, Sodium 135, GFR 47  A complete set of results can be found in Results Review.   Recommendations:  Wife will monitor fluid intake and limit to 64 oz daily.  Encouraged to call if experiencing any fluid symptoms.    Follow-up plan: ICM clinic phone appointment on 11/01/2020 to recheck fluid levels.   91 day device clinic remote transmission 11/11/2020.     EP/Cardiology Office Visits:  11/10/2020 with Dr Aundra Dubin.  11/29/2020 with [name], PA.   Copy of ICM check sent to  Dr. Rayann Heman.    3 month ICM trend: 10/25/2020.    1 Year ICM trend:       [name], RN 10/25/2020 2:26 PM

## 2020-10-28 ENCOUNTER — Ambulatory Visit: Payer: Medicare HMO | Admitting: Physical Therapy

## 2020-10-29 ENCOUNTER — Encounter (HOSPITAL_COMMUNITY): Payer: Medicare HMO

## 2020-10-29 ENCOUNTER — Telehealth (HOSPITAL_COMMUNITY): Payer: Self-pay | Admitting: Family Medicine

## 2020-10-29 ENCOUNTER — Telehealth (HOSPITAL_COMMUNITY): Payer: Self-pay | Admitting: *Deleted

## 2020-10-29 DIAGNOSIS — I5022 Chronic systolic (congestive) heart failure: Secondary | ICD-10-CM | POA: Diagnosis not present

## 2020-10-29 DIAGNOSIS — I959 Hypotension, unspecified: Secondary | ICD-10-CM | POA: Diagnosis not present

## 2020-10-29 DIAGNOSIS — H9220 Otorrhagia, unspecified ear: Secondary | ICD-10-CM | POA: Diagnosis not present

## 2020-10-29 DIAGNOSIS — I4891 Unspecified atrial fibrillation: Secondary | ICD-10-CM | POA: Diagnosis not present

## 2020-10-29 DIAGNOSIS — R54 Age-related physical debility: Secondary | ICD-10-CM | POA: Diagnosis not present

## 2020-10-29 DIAGNOSIS — R296 Repeated falls: Secondary | ICD-10-CM | POA: Diagnosis not present

## 2020-10-29 NOTE — Telephone Encounter (Signed)
Received VM from [place] for [place]. pt is in her office his bp is running 90's/40's pt has had 3 falls this week and can not stand up from wheel chair. She does not want to send pt to the emergency room and requests a call directly from [place] call back # [phone]. She asks that Dr.McLean ask her staff to pull her from pt room to discuss pt.   Message forwarded to Dr.McLean

## 2020-10-29 NOTE — Telephone Encounter (Signed)
Received: Today [place], Maricela Bo, FNP  Jamine Wingate Panda, RN Caller: Unspecified (Today,  1:01 PM) That is fine. Will review fluid when he receives his new hand held reader. In meantime, continue current diuretics and daily weights.  Thanks!

## 2020-10-29 NOTE — Telephone Encounter (Signed)
Spoke to patient's wife, [name], advising her we would do a remote transmission today to evaluate Johnpaul's fluid, but that it was not necessary for him to come in for a full visit. Personally discussed situation with Dr. Aundra Dubin and he is in agreement. [name] verbalized understanding and in agreement with plan.  Allena Katz, FNP-BC

## 2020-10-29 NOTE — Telephone Encounter (Signed)
Spoke with wife.  Advised [name] recommended to continue diuretics and daily weights.  Encouraged to call back if condition worsens or use ER if needed.  She will send updated remote transmission when new monitor piece is received.

## 2020-10-29 NOTE — Telephone Encounter (Signed)
Attempted to assist wife in sending remote transmission from home monitor.  Received [phone] error twice.  Provided [employer] number and instructed to call for assistance in troubleshooting monitor.  She will call back with an update after speaking with [facility].

## 2020-10-29 NOTE — Telephone Encounter (Signed)
Wife spoke with [employer].  The monitor needs a new hand held reader and new one should arrive in 7-10 days.  She will call back when she is able to send a remote transmission.  She is currently not able to send a remote transmission to check fluid levels.    Advised I would inform Allena Katz, NP at HF clinic that unable to obtain remote transmission for fluid level review.   Advised if HF clinic will call if there are any further instructions or recommendations.

## 2020-11-01 ENCOUNTER — Telehealth: Payer: Self-pay

## 2020-11-01 ENCOUNTER — Ambulatory Visit (INDEPENDENT_AMBULATORY_CARE_PROVIDER_SITE_OTHER): Payer: Medicare HMO

## 2020-11-01 DIAGNOSIS — I5042 Chronic combined systolic (congestive) and diastolic (congestive) heart failure: Secondary | ICD-10-CM

## 2020-11-01 DIAGNOSIS — Z95810 Presence of automatic (implantable) cardiac defibrillator: Secondary | ICD-10-CM

## 2020-11-01 NOTE — Telephone Encounter (Signed)
Remote ICM transmission received.  Attempted call to patient regarding ICM remote transmission and left detailed per DPR.  Advised to return call for any fluid symptoms or questions.

## 2020-11-01 NOTE — Progress Notes (Signed)
EPIC Encounter for ICM Monitoring  Patient Name: [name] is a 80 y.o. male Date: 11/01/2020 Primary Care Physican: Mayra Neer, MD Primary Cardiologist: Aundra Dubin Electrophysiologist: Allred Bi-V Pacing:  99.4%    10/25/2020 Weight: 174-176 lbs   Time in AT/AF  0.0 hr/day (0.0%)         Attempted call to wife and unable to reach.  Left detailed message per DPR regarding transmission. Transmission reviewed.        OptiVol Thoracic impedance fluid levels returned to normal.   Prescribed:  Furosemide 40 mg Take 2 tablets (80 mg total) every morning and 1 tablet (40 mg total) every other evening. Potassium 20 mEq take 2 tablets by mouth daily    Labs:  10/11/2020 Creatinine 1.75, BUN 30, Potassium 4.2, Sodium 138, GFR 39 09/29/2020 Creatinine 1.40, BUN 23, Potassium 4.5, sodium 137, GFR 51 08/24/2020 Creatinine 1.74, BUN 29, Potassium 3.8, Sodium 138, GFR 40 08/10/2020 Creatinine 1.91, BUN 22, Potassium 4.3, Sodium 138, GFR 36 08/03/2020 Creatinine 2.24, BUN 32, Potassium 4.4, Sodium 136, GFR 29 07/27/2020 Creatinine 1.58, BUN 29, Potassium 3.1, Sodium 137, GFR 45 07/26/2020 Creatinine 1.82, BUN 31, Potassium 3.7, Sodium 136, GFR 38 07/25/2020 Creatinine 1.70, BUN 25, Potassium 3.6, Sodium 134, GFR 41 07/24/2020 Creatinine 1.53, BUN 26, Potassium 3.1, Sodium 135, GFR 47  A complete set of results can be found in Results Review.   Recommendations:  Left voice mail with ICM number and encouraged to call if experiencing any fluid symptoms.   Follow-up plan: ICM clinic phone appointment on 11/29/2020.   91 day device clinic remote transmission 11/11/2020.     EP/Cardiology Office Visits:  11/10/2020 with Dr Aundra Dubin.  11/29/2020 with [name], PA.   Copy of ICM check sent to Dr. Rayann Heman and Allena Katz, NP at [facility] clinic as [place].   3 month ICM trend: 11/01/2020.    1 Year ICM trend:       [name], RN 11/01/2020 4:36 PM

## 2020-11-08 ENCOUNTER — Other Ambulatory Visit (HOSPITAL_COMMUNITY): Payer: Self-pay | Admitting: Cardiology

## 2020-11-08 ENCOUNTER — Ambulatory Visit: Payer: Medicare HMO | Admitting: Physical Therapy

## 2020-11-08 ENCOUNTER — Other Ambulatory Visit: Payer: Self-pay

## 2020-11-08 ENCOUNTER — Encounter: Payer: Self-pay | Admitting: Physical Therapy

## 2020-11-08 VITALS — BP 118/60 | HR 66

## 2020-11-08 DIAGNOSIS — Z9181 History of falling: Secondary | ICD-10-CM | POA: Diagnosis not present

## 2020-11-08 DIAGNOSIS — R2689 Other abnormalities of gait and mobility: Secondary | ICD-10-CM | POA: Diagnosis not present

## 2020-11-08 DIAGNOSIS — M6281 Muscle weakness (generalized): Secondary | ICD-10-CM

## 2020-11-08 DIAGNOSIS — R2681 Unsteadiness on feet: Secondary | ICD-10-CM

## 2020-11-08 NOTE — Patient Instructions (Signed)
Access Code: [email or web address] URL: [email or web address] Date: 11/08/2020 Prepared by: Janann August  Exercises Sit to Stand with Armchair - 1-2 x daily - 5 x weekly - 2 sets - 5 reps

## 2020-11-08 NOTE — Therapy (Addendum)
[facility] [address] [place], [place], [address] Phone: [phone]   Fax:  [phone]  Physical Therapy Treatment  Patient Details  Name: [name] MRN: [id number] Date of Birth: 03/10/1941 Referring Provider (PT): Mayra Neer, MD   Encounter Date: 11/08/2020   PT End of Session - 11/08/20 0931     Visit Number 2    Number of Visits 9    Date for PT Re-Evaluation 01/13/21    Authorization Type [employer]- $35 co pay    PT Start Time 0848    PT Stop Time 0930    PT Time Calculation (min) 42 min    Equipment Utilized During Treatment Gait belt    Activity Tolerance Patient tolerated treatment well    Behavior During Therapy [facility] for tasks assessed/performed             Past Medical History:  Diagnosis Date   CAD (coronary artery disease)    a. s/p CABG 1997 b. PCI (BMS) of SVG to RCA 3/09 c. redo CABG 02/2011 with SVG to PDA, SVG to Lcx d. Cath 07/2013: ischemic cardiomyopathy with LVEF less than 20%, occlusion of the SVG's placed in 2012 e.stenting of LM in 07/2015 with PCI of LCx performed as well   CHF (congestive heart failure) (HCC)    Chronic systolic dysfunction of left ventricle    EF 30%   Depression    DJD (degenerative joint disease)    DM (diabetes mellitus) ([place]) 02/14/2011   GERD (gastroesophageal reflux disease)    Gout    HTN (hypertension) 02/14/2011   Hyperlipemia    Ischemic cardiomyopathy    s/p ICD Implantation by Dr Leonia Reeves   Paroxysmal atrial fibrillation ([place]) 10/22/2014   single episode of AF x 3 hours 48 minutes recorded on ICD, [email or web address] score is at least 5    Renal disorder    Ventricular tachycardia ([place]) 07/23/2020   appropriate ICD therapy delivered for VT at 222 bpm    Past Surgical History:  Procedure Laterality Date   BI-VENTRICULAR IMPLANTABLE CARDIOVERTER DEFIBRILLATOR UPGRADE N/A 08/24/2011   Procedure: BI-VENTRICULAR IMPLANTABLE CARDIOVERTER  DEFIBRILLATOR UPGRADE;  Surgeon: Evans Lance, MD;  Location: [facility] CATH LAB;  Service: Cardiovascular;  Laterality: N/A;   BIV ICD GENERATOR CHANGEOUT N/A 02/13/2018   Procedure: BIV ICD GENERATOR CHANGEOUT;  Surgeon: Thompson Grayer, MD;  Location: [place] [facility] CV LAB;  Service: Cardiovascular;  Laterality: N/A;   CARDIAC CATHETERIZATION  08/03/2015   Procedure: Left Heart Cath and Cors/Grafts Angiography;  Surgeon: Peter M Martinique, MD;  Location: [facility] CV LAB;  Service: Cardiovascular;;   CARDIAC CATHETERIZATION N/A 08/06/2015   Procedure: Coronary Stent Intervention w/Impella;  Surgeon: Jettie Booze, MD;  Location: [place] CV LAB;  Service: Cardiovascular;  Laterality: N/A;   CARDIAC CATHETERIZATION  08/06/2015   Procedure: Left Heart Cath;  Surgeon: Jettie Booze, MD;  Location: [place] CV LAB;  Service: Cardiovascular;;   CARDIAC CATHETERIZATION  08/06/2015   Procedure: Coronary Balloon Angioplasty;  Surgeon: Jettie Booze, MD;  Location: [facility] CV LAB;  Service: Cardiovascular;;   CARDIAC DEFIBRILLATOR PLACEMENT  08/2004   initial placement, upgraded to [place] ICD by Dr Lovena Le 08/24/11 (MDT)   CARDIOVERSION N/A 09/26/2019   Procedure: CARDIOVERSION;  Surgeon: Larey Dresser, MD;  Location: [facility] ENDOSCOPY;  Service: Cardiovascular;  Laterality: N/A;   CATARACT EXTRACTION W/ INTRAOCULAR LENS  IMPLANT, BILATERAL  2012   CHOLECYSTECTOMY  01/05/2012   Procedure:  LAPAROSCOPIC CHOLECYSTECTOMY WITH INTRAOPERATIVE CHOLANGIOGRAM;  Surgeon: Joyice Faster. Cornett, MD;  Location: [facility];  Service: General;  Laterality: N/A;  laparoscopic cholecysectoym with intraoperative cholangiogram   COLONOSCOPY WITH PROPOFOL N/A 11/18/2013   Procedure: COLONOSCOPY WITH PROPOFOL;  Surgeon: Garlan Fair, MD;  Location: [facility] ENDOSCOPY;  Service: Endoscopy;  Laterality: N/A;   CORONARY ANGIOPLASTY WITH STENT PLACEMENT  06/2007   BMS to SVG to RCA   CORONARY ARTERY BYPASS GRAFT  01/1996   CABG x 5  LIMA to LAD SVG to diag1,2,svg to om,svg to RCA   CORONARY ARTERY BYPASS GRAFT  03/06/2011   CABG X2; Procedure: REDO CORONARY ARTERY BYPASS GRAFTING (CABG);  Surgeon: Grace Isaac, MD;  Location: [facility];  Service: Open Heart Surgery;  Laterality: N/A;  times two grafts using right saphenous vein harvested endoscopically.   KNEE ARTHROTOMY  ~ 1978   RIGHT KNEE CARTILAGE REMOVED   LEFT HEART CATHETERIZATION WITH CORONARY ANGIOGRAM N/A 06/06/2011   Procedure: LEFT HEART CATHETERIZATION WITH CORONARY ANGIOGRAM;  Surgeon: Sueanne Margarita, MD;  Location: [facility] CATH LAB;  Service: Cardiovascular;  Laterality: N/A;   LEFT HEART CATHETERIZATION WITH CORONARY/GRAFT ANGIOGRAM N/A 08/08/2013   Procedure: LEFT HEART CATHETERIZATION WITH Beatrix Fetters;  Surgeon: Sinclair Grooms, MD;  Location: [facility] CATH LAB;  Service: Cardiovascular;  Laterality: N/A;   LUMBAR DISC SURGERY  2003   RIGHT HEART CATHETERIZATION N/A 02/17/2014   Procedure: RIGHT HEART CATH;  Surgeon: Larey Dresser, MD;  Location: [facility] CATH LAB;  Service: Cardiovascular;  Laterality: N/A;   RIGHT/LEFT HEART CATH AND CORONARY/GRAFT ANGIOGRAPHY N/A 07/26/2020   Procedure: RIGHT/LEFT HEART CATH AND CORONARY/GRAFT ANGIOGRAPHY;  Surgeon: Larey Dresser, MD;  Location: [facility] CV LAB;  Service: Cardiovascular;  Laterality: N/A;   TEE WITHOUT CARDIOVERSION N/A 09/26/2019   Procedure: TRANSESOPHAGEAL ECHOCARDIOGRAM (TEE);  Surgeon: Larey Dresser, MD;  Location: [facility] ENDOSCOPY;  Service: Cardiovascular;  Laterality: N/A;   VENOGRAM N/A 06/08/2011   Procedure: VENOGRAM;  Surgeon: Thompson Grayer, MD;  Location: [facility] CATH LAB;  Service: Cardiovascular;  Laterality: N/A;    Vitals:   11/08/20 0858  BP: 118/60  Pulse: 66     Subjective Assessment - 11/08/20 0851     Subjective Has had a couple falls since he has been here. Had 2 falls. One time he lost his balance forwards and the 2nd time he lost his balance backwards. Now using a rollator all  the time. Wife reports that he has trouble standing on his right leg. Now wearing B knee braces and reports that they help him get up. Just a little dizzy.    Patient is accompained by: Family member   wife   Pertinent History PMH: chronic systolic CHF, CAD s/p CABG and redo, CKD stage III, HLD, a fib, DM, implantable cardioverter-defibrillator    Limitations Walking;House hold activities;Standing    Patient Stated Goals improve the balance - does not want to use a cane.    Currently in Pain? No/denies                [facility] PT Assessment - 11/08/20 0901       Berg Balance Test   Sit to Stand Able to stand  independently using hands    Standing Unsupported Able to stand safely 2 minutes    Sitting with Back Unsupported but Feet Supported on Floor or Stool Able to sit safely and securely 2 minutes    Stand to Sit Sits safely with minimal  use of hands    Transfers Able to transfer safely, definite need of hands    Standing Unsupported with Eyes Closed Able to stand 10 seconds with supervision    Standing Unsupported with Feet Together Needs help to attain position but able to stand for 30 seconds with feet together    From Standing, Reach Forward with Outstretched Arm Can reach forward >12 cm safely (5")    From Standing Position, Pick up Object from Floor Unable to try/needs assist to keep balance    From Standing Position, Turn to Look Behind Over each Shoulder Turn sideways only but maintains balance    Turn 360 Degrees Able to turn 360 degrees safely but slowly   19 seconds to R, 17 seconds to L   Standing Unsupported, Alternately Place Feet on Step/Stool Able to complete >2 steps/needs minimal assist    Standing Unsupported, One Foot in Front Able to take small step independently and hold 30 seconds    Standing on One Leg Unable to try or needs assist to prevent fall    Total Score 32    Berg comment: 32/56               Access Code: [email or web address] URL:  [email or web address] Date: 11/08/2020 Prepared by: Janann August  Initiated HEP:   Exercises Sit to Stand with Armchair - 1-2 x daily - 5 x weekly - 2 sets - 5 reps            [facility] Adult PT Treatment/Exercise - 11/08/20 0001       Transfers   Transfers Sit to Stand;Stand to Sit    Sit to Stand 5: Supervision;With upper extremity assist    Stand to Sit 5: Supervision    Comments cues for proper sequencing and wider BOS, once in standing cued to count to 8-10 and with tall posture before beginning to ambulate due to hx of orthostatics and to assist with balance      Ambulation/Gait   Ambulation/Gait Yes    Ambulation/Gait Assistance 5: Supervision;4: Min guard    Ambulation/Gait Assistance Details pt brought in his rollator for home - adjusted to proper height for pt (was too low before), with pt reporting it feeling better with height adjusted. cues for posture    Ambulation Distance (Feet) 115 Feet   x1   Assistive device Rollator    Gait Pattern Step-through pattern;Decreased step length - right;Decreased step length - left;Decreased arm swing - left;Decreased hip/knee flexion - right;Decreased hip/knee flexion - left;Trunk flexed    Ambulation Surface Level;Indoor      Posture/Postural Control   Posture/Postural Control Postural limitations    Postural Limitations Rounded Shoulders;Forward head;Flexed trunk      Therapeutic Activites    Therapeutic Activities Other Therapeutic Activities    Other Therapeutic Activities due to recent falls, educated to use the rollator at all times for safety and to make sure that he takes his time with sit > stand transfers as pt has had a fall from getting up too quickly. educated pt and pt's spouse that if pt is getting any incr pain from where he bumped during her falls then would want to go to urgent care to be further assessed                    PT Education - 11/08/20 0931     Education Details  results of BERG, initial HEP, using rollator at all times.    Person(s) Educated Patient;Spouse  Methods Explanation    Comprehension Verbalized understanding              PT Short Term Goals - 11/08/20 1213       PT SHORT TERM GOAL #1   Title Pt will finish assessment of BERG with LTG written as appropriate. ALL STGS DUE 12/14/20    Baseline 32/56 on7/25/22    Time 8    Period Weeks    Status Achieved    Target Date 12/14/20      PT SHORT TERM GOAL #2   Title Pt and pt's spouse will verbalize understanding of fall prevention in the home.    Time 4    Period Weeks    Status New      PT SHORT TERM GOAL #3   Title Pt will decr 5x sit <> stand time to 23 seconds or less with BUE support in order to demo improved functional BLE strength for transfers and decr fall risk.    Baseline 26.38 seconds with BUE support.    Time 4    Period Weeks    Status New      PT SHORT TERM GOAL #4   Title Pt will improve gait speed to at least 2.0 ft/sec with LRAD in order to demo improved community mobility.    Baseline 1.84 ft/sec    Time 4    Period Weeks    Status New               PT Long Term Goals - 11/08/20 1214       PT LONG TERM GOAL #1   Title Pt will be independent with final HEP with wife's supervision in order to build upon functional gains made in therapy. ALL LTGS DUE 12/14/20    Time 8    Period Weeks    Status New      PT LONG TERM GOAL #2   Title Pt will improve BERG to at least a 39/56 in order to demo decr fall risk.    Baseline 32/56    Time 8    Period Weeks    Status Revised      PT LONG TERM GOAL #3   Title Pt will decr 5x sit <> stand time to 18 seconds or less  with BUE support in order to demo decr fall risk.    Baseline 26.38 seconds with BUE support.    Time 8    Period Weeks    Status New      PT LONG TERM GOAL #4   Title Pt will recover posterior and anterior balance in push and release test in 2 or less robust steps independently, for  improved balance recovery    Baseline anterior = unable to ellicit, posterior = 2 delayed small steps.    Time 8    Period Weeks    Status New      PT LONG TERM GOAL #5   Title Pt will improve gait speed to at least 2.50f/sec with LRAD in order to demo improved community mobility.    Baseline 1.84 ft/sec with SPC.    Time 8    Period Weeks    Status New                   Plan - 11/08/20 1216     Clinical Impression Statement Performed the BERG today with pt scoring a 32/56, putting pt at a significant risk for falls. Educated to make sure  that pt is using his rollator at all times. Needing intermittent seated rest breaks during test due to fatigue. Pt with incr forward flexed posture throughout BERG assessment. Began to initiate HEP today for sit <> stands and educated pt and pt's spouse to make sure pt takes his time when standing before walking.    Personal Factors and Comorbidities Past/Current Experience;Comorbidity 3+    Comorbidities chronic systolic CHF, CAD s/p CABG and redo, CKD stage III, HLD, a fib, DM    Examination-Activity Limitations Stairs;Transfers;Locomotion Level;Squat    Examination-Participation Restrictions Community Activity;Driving;Yard Work    [employer] Evolving/Moderate complexity    Rehab Potential Good    PT Frequency 1x / week    PT Duration 12 weeks   8 visits over 12 weeks   PT Treatment/Interventions ADLs/Self Care Home Management;DME Instruction;Gait training;Stair training;Functional mobility training;Therapeutic activities;Neuromuscular re-education;Balance training;Therapeutic exercise;Patient/family education;Vestibular    PT Next Visit Plan conitnue adding to initial HEP for balance/ functional BLE strength. SLS and narrow BOS, work on stepping strategies, gait with rollator.    PT Home Exercise Plan [id number]    Consulted and Agree with Plan of Care Patient;Family member/caregiver    Family Member Consulted spouse,  [name]             Patient will benefit from skilled therapeutic intervention in order to improve the following deficits and impairments:  Abnormal gait, Decreased activity tolerance, Decreased coordination, Decreased balance, Decreased endurance, Decreased strength, Impaired sensation, Postural dysfunction, Decreased knowledge of use of DME  Visit Diagnosis: Unsteadiness on feet  History of falling  Muscle weakness (generalized)  Other abnormalities of gait and mobility     Problem List Patient Active Problem List   Diagnosis Date Noted   Ventricular fibrillation ([place]) 07/23/2020   Syncope and collapse 09/24/2017   GERD (gastroesophageal reflux disease) 09/24/2017   Type 2 diabetes mellitus ([place]) 09/24/2017   Chronic combined systolic and diastolic CHF (congestive heart failure) ([place]) 09/24/2017   Acute renal failure superimposed on stage 3 chronic kidney disease ([place]) 09/24/2017   Syncope    Acute kidney injury ([place]) 05/01/2017   Confusion 05/01/2017   NSTEMI (non-ST elevated myocardial infarction) ([place])    Unstable angina (HCC)    Chest pain with high risk of acute coronary syndrome 08/01/2015   CKD (chronic kidney disease) stage 3, GFR 30-59 ml/min (HCC) 06/14/2015   Angina pectoris ([place]) 02/17/2015   Paroxysmal atrial fibrillation ([place]) 01/11/2015   Hypersomnia [id number]   Chronic systolic CHF (congestive heart failure) ([place]) 12/12/2012   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   Chronic cholecystitis with calculus 01/02/2012   Nausea vomiting and diarrhea 12/24/2011   Dizziness 12/24/2011   Preop cardiovascular exam 12/24/2011   CAD (coronary artery disease) 02/14/2011   HTN (hypertension) 02/14/2011   DM (diabetes mellitus) ([place]) 02/14/2011   Ischemic cardiomyopathy 10/14/2010   Hyperlipemia 10/14/2010    Arliss Journey, PT, DPT  11/08/2020, 12:17 PM  [facility] [address] [place], [place], [address] Phone: [phone]   Fax:  [phone]  Name: ANGELITA GASPARINI MRN: [id number] Date of Birth: 12-May-1940

## 2020-11-10 ENCOUNTER — Encounter (HOSPITAL_COMMUNITY): Payer: Medicare HMO

## 2020-11-11 ENCOUNTER — Ambulatory Visit (INDEPENDENT_AMBULATORY_CARE_PROVIDER_SITE_OTHER): Payer: Medicare HMO

## 2020-11-11 DIAGNOSIS — I255 Ischemic cardiomyopathy: Secondary | ICD-10-CM

## 2020-11-12 DIAGNOSIS — N179 Acute kidney failure, unspecified: Secondary | ICD-10-CM | POA: Diagnosis not present

## 2020-11-12 LAB — CUP PACEART REMOTE DEVICE CHECK
Battery Remaining Longevity: 33 mo
Battery Voltage: 2.96 V
Brady Statistic AP VP Percent: 2.68 %
Brady Statistic AP VS Percent: 0.01 %
Brady Statistic AS VP Percent: 97.24 %
Brady Statistic AS VS Percent: 0.08 %
Brady Statistic RA Percent Paced: 2.67 %
Brady Statistic RV Percent Paced: 99.56 %
Date Time Interrogation Session: 20220728001604
HighPow Impedance: 41 Ohm
HighPow Impedance: 52 Ohm
Implantable Lead Implant Date: 20111003
Implantable Lead Implant Date: 20111003
Implantable Lead Implant Date: 20130509
Implantable Lead Location: 753858
Implantable Lead Location: 753859
Implantable Lead Location: 753860
Implantable Lead Model: 4196
Implantable Lead Model: 5076
Implantable Lead Model: 6947
Implantable Pulse Generator Implant Date: 20191030
Lead Channel Impedance Value: 380 Ohm
Lead Channel Impedance Value: 380 Ohm
Lead Channel Impedance Value: 437 Ohm
Lead Channel Impedance Value: 513 Ohm
Lead Channel Impedance Value: 646 Ohm
Lead Channel Impedance Value: 760 Ohm
Lead Channel Pacing Threshold Amplitude: 0.625 V
Lead Channel Pacing Threshold Amplitude: 1 V
Lead Channel Pacing Threshold Amplitude: 2.5 V
Lead Channel Pacing Threshold Pulse Width: 0.4 ms
Lead Channel Pacing Threshold Pulse Width: 0.4 ms
Lead Channel Pacing Threshold Pulse Width: 0.4 ms
Lead Channel Sensing Intrinsic Amplitude: 1.125 mV
Lead Channel Sensing Intrinsic Amplitude: 1.125 mV
Lead Channel Sensing Intrinsic Amplitude: 31.625 mV
Lead Channel Sensing Intrinsic Amplitude: 31.625 mV
Lead Channel Setting Pacing Amplitude: 1.5 V
Lead Channel Setting Pacing Amplitude: 2 V
Lead Channel Setting Pacing Amplitude: 3 V
Lead Channel Setting Pacing Pulse Width: 0.4 ms
Lead Channel Setting Pacing Pulse Width: 0.4 ms
Lead Channel Setting Sensing Sensitivity: 0.3 mV

## 2020-11-15 ENCOUNTER — Ambulatory Visit: Payer: Medicare HMO | Attending: Family Medicine | Admitting: Physical Therapy

## 2020-11-15 ENCOUNTER — Other Ambulatory Visit: Payer: Self-pay

## 2020-11-15 ENCOUNTER — Encounter: Payer: Self-pay | Admitting: Physical Therapy

## 2020-11-15 DIAGNOSIS — M6281 Muscle weakness (generalized): Secondary | ICD-10-CM | POA: Diagnosis not present

## 2020-11-15 DIAGNOSIS — Z9181 History of falling: Secondary | ICD-10-CM | POA: Diagnosis not present

## 2020-11-15 DIAGNOSIS — R2689 Other abnormalities of gait and mobility: Secondary | ICD-10-CM | POA: Insufficient documentation

## 2020-11-15 DIAGNOSIS — R2681 Unsteadiness on feet: Secondary | ICD-10-CM | POA: Insufficient documentation

## 2020-11-15 NOTE — Therapy (Signed)
[facility] [address] [place], [place], [address] Phone: [phone]   Fax:  [phone]  Physical Therapy Treatment  Patient Details  Name: [name] MRN: [id number] Date of Birth: 1940-07-06 Referring Provider (PT): Mayra Neer, MD   Encounter Date: 11/15/2020   PT End of Session - 11/15/20 1019     Visit Number 3    Number of Visits 9    Date for PT Re-Evaluation 01/13/21    Authorization Type [employer]- $35 co pay    PT Start Time 0930    PT Stop Time 1015    PT Time Calculation (min) 45 min    Equipment Utilized During Treatment Gait belt    Activity Tolerance Patient tolerated treatment well    Behavior During Therapy [facility] for tasks assessed/performed             Past Medical History:  Diagnosis Date   CAD (coronary artery disease)    a. s/p CABG 1997 b. PCI (BMS) of SVG to RCA 3/09 c. redo CABG 02/2011 with SVG to PDA, SVG to Lcx d. Cath 07/2013: ischemic cardiomyopathy with LVEF less than 20%, occlusion of the SVG's placed in 2012 e.stenting of LM in 07/2015 with PCI of LCx performed as well   CHF (congestive heart failure) (HCC)    Chronic systolic dysfunction of left ventricle    EF 30%   Depression    DJD (degenerative joint disease)    DM (diabetes mellitus) ([place]) 02/14/2011   GERD (gastroesophageal reflux disease)    Gout    HTN (hypertension) 02/14/2011   Hyperlipemia    Ischemic cardiomyopathy    s/p ICD Implantation by Dr Leonia Reeves   Paroxysmal atrial fibrillation ([place]) 10/22/2014   single episode of AF x 3 hours 48 minutes recorded on ICD, chads2vasc score is at least 5    Renal disorder    Ventricular tachycardia ([place]) 07/23/2020   appropriate ICD therapy delivered for VT at 222 bpm    Past Surgical History:  Procedure Laterality Date   BI-VENTRICULAR IMPLANTABLE CARDIOVERTER DEFIBRILLATOR UPGRADE N/A 08/24/2011   Procedure: BI-VENTRICULAR IMPLANTABLE CARDIOVERTER  DEFIBRILLATOR UPGRADE;  Surgeon: Evans Lance, MD;  Location: [facility] CATH LAB;  Service: Cardiovascular;  Laterality: N/A;   BIV ICD GENERATOR CHANGEOUT N/A 02/13/2018   Procedure: BIV ICD GENERATOR CHANGEOUT;  Surgeon: Thompson Grayer, MD;  Location: [facility] CV LAB;  Service: Cardiovascular;  Laterality: N/A;   CARDIAC CATHETERIZATION  08/03/2015   Procedure: Left Heart Cath and Cors/Grafts Angiography;  Surgeon: Peter M Martinique, MD;  Location: [facility] CV LAB;  Service: Cardiovascular;;   CARDIAC CATHETERIZATION N/A 08/06/2015   Procedure: Coronary Stent Intervention w/Impella;  Surgeon: Jettie Booze, MD;  Location: [facility] CV LAB;  Service: Cardiovascular;  Laterality: N/A;   CARDIAC CATHETERIZATION  08/06/2015   Procedure: Left Heart Cath;  Surgeon: Jettie Booze, MD;  Location: [facility] CV LAB;  Service: Cardiovascular;;   CARDIAC CATHETERIZATION  08/06/2015   Procedure: Coronary Balloon Angioplasty;  Surgeon: Jettie Booze, MD;  Location: [facility] CV LAB;  Service: Cardiovascular;;   CARDIAC DEFIBRILLATOR PLACEMENT  08/2004   initial placement, upgraded to [place] ICD by Dr Lovena Le 08/24/11 (MDT)   CARDIOVERSION N/A 09/26/2019   Procedure: CARDIOVERSION;  Surgeon: Larey Dresser, MD;  Location: [facility] ENDOSCOPY;  Service: Cardiovascular;  Laterality: N/A;   CATARACT EXTRACTION W/ INTRAOCULAR LENS  IMPLANT, BILATERAL  2012   CHOLECYSTECTOMY  01/05/2012   Procedure:  LAPAROSCOPIC CHOLECYSTECTOMY WITH INTRAOPERATIVE CHOLANGIOGRAM;  Surgeon: Joyice Faster. Cornett, MD;  Location: [facility];  Service: General;  Laterality: N/A;  laparoscopic cholecysectoym with intraoperative cholangiogram   COLONOSCOPY WITH PROPOFOL N/A 11/18/2013   Procedure: COLONOSCOPY WITH PROPOFOL;  Surgeon: Garlan Fair, MD;  Location: [facility] ENDOSCOPY;  Service: Endoscopy;  Laterality: N/A;   CORONARY ANGIOPLASTY WITH STENT PLACEMENT  06/2007   BMS to SVG to RCA   CORONARY ARTERY BYPASS GRAFT  01/1996   CABG x 5  LIMA to LAD SVG to diag1,2,svg to om,svg to RCA   CORONARY ARTERY BYPASS GRAFT  03/06/2011   CABG X2; Procedure: REDO CORONARY ARTERY BYPASS GRAFTING (CABG);  Surgeon: Grace Isaac, MD;  Location: [facility];  Service: Open Heart Surgery;  Laterality: N/A;  times two grafts using right saphenous vein harvested endoscopically.   KNEE ARTHROTOMY  ~ 1978   RIGHT KNEE CARTILAGE REMOVED   LEFT HEART CATHETERIZATION WITH CORONARY ANGIOGRAM N/A 06/06/2011   Procedure: LEFT HEART CATHETERIZATION WITH CORONARY ANGIOGRAM;  Surgeon: Sueanne Margarita, MD;  Location: [facility] CATH LAB;  Service: Cardiovascular;  Laterality: N/A;   LEFT HEART CATHETERIZATION WITH CORONARY/GRAFT ANGIOGRAM N/A 08/08/2013   Procedure: LEFT HEART CATHETERIZATION WITH Beatrix Fetters;  Surgeon: Sinclair Grooms, MD;  Location: [facility] CATH LAB;  Service: Cardiovascular;  Laterality: N/A;   LUMBAR DISC SURGERY  2003   RIGHT HEART CATHETERIZATION N/A 02/17/2014   Procedure: RIGHT HEART CATH;  Surgeon: Larey Dresser, MD;  Location: [facility] CATH LAB;  Service: Cardiovascular;  Laterality: N/A;   RIGHT/LEFT HEART CATH AND CORONARY/GRAFT ANGIOGRAPHY N/A 07/26/2020   Procedure: RIGHT/LEFT HEART CATH AND CORONARY/GRAFT ANGIOGRAPHY;  Surgeon: Larey Dresser, MD;  Location: [facility] CV LAB;  Service: Cardiovascular;  Laterality: N/A;   TEE WITHOUT CARDIOVERSION N/A 09/26/2019   Procedure: TRANSESOPHAGEAL ECHOCARDIOGRAM (TEE);  Surgeon: Larey Dresser, MD;  Location: [facility] ENDOSCOPY;  Service: Cardiovascular;  Laterality: N/A;   VENOGRAM N/A 06/08/2011   Procedure: VENOGRAM;  Surgeon: Thompson Grayer, MD;  Location: [facility] CATH LAB;  Service: Cardiovascular;  Laterality: N/A;    There were no vitals filed for this visit.   Subjective Assessment - 11/15/20 0933     Subjective No falls. Has still been using his rollator.    Patient is accompained by: Family member   wife   Pertinent History PMH: chronic systolic CHF, CAD s/p CABG and redo, CKD stage  III, HLD, a fib, DM, implantable cardioverter-defibrillator    Limitations Walking;House hold activities;Standing    Patient Stated Goals improve the balance - does not want to use a cane.    Currently in Pain? No/denies                               [facility] Adult PT Treatment/Exercise - 11/15/20 0001       Transfers   Transfers Sit to Stand;Stand to Sit    Sit to Stand 5: Supervision;With upper extremity assist    Stand to Sit 5: Supervision    Transfer Cueing pt needing cues to fully turn with rollator and lock brakes prior to sitting down and before performing sit > stand    Comments x5 reps with initial cues for proper BUE placement on mat table and cues for tall posture in standing      Ambulation/Gait   Ambulation/Gait Yes    Ambulation/Gait Assistance 5: Supervision    Ambulation/Gait Assistance Details into and out  of session with rollator - cues for posture and foot clearance    Assistive device Rollator    Gait Pattern Step-through pattern;Decreased step length - right;Decreased step length - left;Decreased arm swing - left;Decreased hip/knee flexion - right;Decreased hip/knee flexion - left;Trunk flexed    Ambulation Surface Level;Indoor            Access Code: [email or web address] URL: [email or web address] Date: 11/15/2020 Prepared by: Janann August  New additions to HEP, see Will for more details. Pt's spouse to be with pt for exercises for cueing and safety.   Exercises Sit to Stand with Armchair - 1-2 x daily - 5 x weekly - 2 sets - 5 reps  Added bolded exercises below:  Seated Heel Toe Raises - 2 x daily - 5 x weekly - 2 sets - 10 reps Seated Long Arc Quad - 1 x daily - 5 x weekly - 2 sets - 10 reps Standing Single Leg Stance with Counter Support - 2 x daily - 5 x weekly - 3 sets - 10-15 hold Side Stepping with Counter Support - 2 x daily - 5 x weekly - 3 sets - cues for incr foot clearance Standing with Head Rotation - 1 x  daily - 5 x weekly - 2 sets - 5 reps - standing at locked rollator, 2 x 5 reps head turns, 2 x 5 reps head nods, feet hip width distance      Balance Exercises - 11/15/20 0001       Balance Exercises: Standing   Standing Eyes Closed Solid surface;Limitations    Standing Eyes Closed Limitations slightly more narrow than hip width 3 x 20 seconds with min guard for balance, standing at locked rollator    Marching Solid surface;Upper extremity assist 2;Limitations    Marching Limitations standing at locked rollator x6 reps B, cues for posture and looking ahead               PT Education - 11/15/20 1018     Education Details additions to HEP, fall prevention handout    Person(s) Educated Patient;Spouse    Methods Explanation;Demonstration;Handout;Verbal cues    Comprehension Verbalized understanding;Returned demonstration;Need further instruction              PT Short Term Goals - 11/15/20 1019       PT SHORT TERM GOAL #1   Title Pt will finish assessment of BERG with LTG written as appropriate. ALL STGS DUE 11/13/20    Baseline 32/56 on7/25/22    Time 4    Period Weeks    Status Achieved    Target Date 12/14/20      PT SHORT TERM GOAL #2   Title Pt and pt's spouse will verbalize understanding of fall prevention in the home.    Baseline provided handout on 11/15/20 and discussed with pt and pt's spouse    Time 4    Period Weeks    Status Achieved      PT SHORT TERM GOAL #3   Title Pt will decr 5x sit <> stand time to 23 seconds or less with BUE support in order to demo improved functional BLE strength for transfers and decr fall risk.    Baseline 26.38 seconds with BUE support.    Time 4    Period Weeks    Status New      PT SHORT TERM GOAL #4   Title Pt will improve gait speed to at least 2.0 ft/sec with LRAD in order to  demo improved community mobility.    Baseline 1.84 ft/sec    Time 4    Period Weeks    Status New               PT Long Term Goals -  11/08/20 1214       PT LONG TERM GOAL #1   Title Pt will be independent with final HEP with wife's supervision in order to build upon functional gains made in therapy. ALL LTGS DUE 12/14/20    Time 8    Period Weeks    Status New      PT LONG TERM GOAL #2   Title Pt will improve BERG to at least a 39/56 in order to demo decr fall risk.    Baseline 32/56    Time 8    Period Weeks    Status Revised      PT LONG TERM GOAL #3   Title Pt will decr 5x sit <> stand time to 18 seconds or less  with BUE support in order to demo decr fall risk.    Baseline 26.38 seconds with BUE support.    Time 8    Period Weeks    Status New      PT LONG TERM GOAL #4   Title Pt will recover posterior and anterior balance in push and release test in 2 or less robust steps independently, for improved balance recovery    Baseline anterior = unable to ellicit, posterior = 2 delayed small steps.    Time 8    Period Weeks    Status New      PT LONG TERM GOAL #5   Title Pt will improve gait speed to at least 2.12f/sec with LRAD in order to demo improved community mobility.    Baseline 1.84 ft/sec with SPC.    Time 8    Period Weeks    Status New                   Plan - 11/15/20 1023     Clinical Impression Statement Today's skilled session focused on adding exercises to pt's HEP for strength/balance in sitting and standing with UE support at countertop/rollator. Pt needing cues for proper technique and sequencing of exercises, with pt to perform all with wife's supervision and instruction. Provided handout to pt's wife for fall prevention education in the home. Cues throughout for proper rollator brake management for safety with transfers.    Personal Factors and Comorbidities Past/Current Experience;Comorbidity 3+    Comorbidities chronic systolic CHF, CAD s/p CABG and redo, CKD stage III, HLD, a fib, DM    Examination-Activity Limitations Stairs;Transfers;Locomotion Level;Squat     Examination-Participation Restrictions Community Activity;Driving;Yard Work    SMerchant navy officerEvolving/Moderate complexity    Rehab Potential Good    PT Frequency 1x / week    PT Duration 12 weeks   8 visits over 12 weeks   PT Treatment/Interventions ADLs/Self Care Home Management;DME Instruction;Gait training;Stair training;Functional mobility training;Therapeutic activities;Neuromuscular re-education;Balance training;Therapeutic exercise;Patient/family education;Vestibular    PT Next Visit Plan need to check STGs. how is HEP? SLS and narrow BOS, functional BLE strength, working on incr foot clearance activities.  work on stepping strategies, gait with rollator.    PT Home Exercise Plan [id number]   Consulted and Agree with Plan of Care Patient;Family member/caregiver    Family Member Consulted spouse, PNevin Bloodgood            Patient will benefit  from skilled therapeutic intervention in order to improve the following deficits and impairments:  Abnormal gait, Decreased activity tolerance, Decreased coordination, Decreased balance, Decreased endurance, Decreased strength, Impaired sensation, Postural dysfunction, Decreased knowledge of use of DME  Visit Diagnosis: Unsteadiness on feet  Muscle weakness (generalized)  History of falling     Problem List Patient Active Problem List   Diagnosis Date Noted   Ventricular fibrillation ([place]) 07/23/2020   Syncope and collapse 09/24/2017   GERD (gastroesophageal reflux disease) 09/24/2017   Type 2 diabetes mellitus ([employer]) 09/24/2017   Chronic combined systolic and diastolic CHF (congestive heart failure) ([place]) 09/24/2017   Acute renal failure superimposed on stage 3 chronic kidney disease ([place]) 09/24/2017   Syncope    Acute kidney injury ([place]) 05/01/2017   Confusion 05/01/2017   NSTEMI (non-ST elevated myocardial infarction) ([place])    Unstable angina (HCC)    Chest pain with high risk of acute coronary syndrome 08/01/2015    CKD (chronic kidney disease) stage 3, GFR 30-59 ml/min (HCC) 06/14/2015   Angina pectoris ([place]) 02/17/2015   Paroxysmal atrial fibrillation ([place]) 01/11/2015   Hypersomnia [id number]   Chronic systolic CHF (congestive heart failure) ([place]) 12/12/2012   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 [place], [place], [address] Phone: [phone]   Fax:  [phone]  Name: MILNER BRENNEKE MRN: [id number] Date of Birth: [birth year]

## 2020-11-15 NOTE — Patient Instructions (Addendum)
Fall Prevention in the Home, Adult Falls can cause injuries and can happen to people of all ages. There are many things you can do to make your home safe and to help prevent falls. Ask forhelp when making these changes. What actions can I take to prevent falls? General Instructions Use good lighting in all rooms. Replace any light bulbs that burn out. Turn on the lights in dark areas. Use night-lights. Keep items that you use often in easy-to-reach places. Lower the shelves around your home if needed. Set up your furniture so you have a clear path. Avoid moving your furniture around. Do not have throw rugs or other things on the floor that can make you trip. Avoid walking on wet floors. If any of your floors are uneven, fix them. Add color or contrast paint or tape to clearly mark and help you see: Grab bars or handrails. First and last steps of staircases. Where the edge of each step is. If you use a stepladder: Make sure that it is fully opened. Do not climb a closed stepladder. Make sure the sides of the stepladder are locked in place. Ask someone to hold the stepladder while you use it. Know where your pets are when moving through your home. What can I do in the bathroom?     Keep the floor dry. Clean up any water on the floor right away. Remove soap buildup in the tub or shower. Use nonskid mats or decals on the floor of the tub or shower. Attach bath mats securely with double-sided, nonslip rug tape. If you need to sit down in the shower, use a plastic, nonslip stool. Install grab bars by the toilet and in the tub and shower. Do not use towel bars as grab bars. What can I do in the bedroom? Make sure that you have a light by your bed that is easy to reach. Do not use any sheets or blankets for your bed that hang to the floor. Have a firm chair with side arms that you can use for support when you get dressed. What can I do in the kitchen? Clean up any spills right away. If  you need to reach something above you, use a step stool with a grab bar. Keep electrical cords out of the way. Do not use floor polish or wax that makes floors slippery. What can I do with my stairs? Do not leave any items on the stairs. Make sure that you have a light switch at the top and the bottom of the stairs. Make sure that there are handrails on both sides of the stairs. Fix handrails that are broken or loose. Install nonslip stair treads on all your stairs. Avoid having throw rugs at the top or bottom of the stairs. Choose a carpet that does not hide the edge of the steps on the stairs. Check carpeting to make sure that it is firmly attached to the stairs. Fix carpet that is loose or worn. What can I do on the outside of my home? Use bright outdoor lighting. Fix the edges of walkways and driveways and fix any cracks. Remove anything that might make you trip as you walk through a door, such as a raised step or threshold. Trim any bushes or trees on paths to your home. Check to see if handrails are loose or broken and that both sides of all steps have handrails. Install guardrails along the edges of any raised decks and porches. Clear paths of anything that   can make you trip, such as tools or rocks. Have leaves, snow, or ice cleared regularly. Use sand or salt on paths during winter. Clean up any spills in your garage right away. This includes grease or oil spills. What other actions can I take? Wear shoes that: Have a low heel. Do not wear high heels. Have rubber bottoms. Feel good on your feet and fit well. Are closed at the toe. Do not wear open-toe sandals. Use tools that help you move around if needed. These include: Canes. Walkers. Scooters. Crutches. Review your medicines with your doctor. Some medicines can make you feel dizzy. This can increase your chance of falling. Ask your doctor what else you can do to help prevent falls. Where to find more information Centers  for Disease Control and Prevention, [employer]: http://www.wolf.info/ National Institute on Aging: http://kim-miller.com/ Contact a doctor if: You are afraid of falling at home. You feel weak, drowsy, or dizzy at home. You fall at home. Summary There are many simple things that you can do to make your home safe and to help prevent falls. Ways to make your home safe include removing things that can make you trip and installing grab bars in the bathroom. Ask for help when making these changes in your home. This information is not intended to replace advice given to you by your health care provider. Make sure you discuss any questions you have with your healthcare provider. Document Revised: 11/05/2019 Document Reviewed: 11/05/2019 Elsevier Patient Education  [place].     Access Code: [email or web address] URL: [email or web address] Date: 11/15/2020 Prepared by: Janann August  Exercises Sit to Stand with Armchair - 1-2 x daily - 5 x weekly - 2 sets - 5 reps Seated Heel Toe Raises - 2 x daily - 5 x weekly - 2 sets - 10 reps Seated Long Arc Quad - 1 x daily - 5 x weekly - 2 sets - 10 reps Standing Single Leg Stance with Counter Support - 2 x daily - 5 x weekly - 3 sets - 10-15 hold Side Stepping with Counter Support - 2 x daily - 5 x weekly - 3 sets Standing with Head Rotation - 1 x daily - 5 x weekly - 2 sets - 5 reps

## 2020-11-15 NOTE — Progress Notes (Signed)
[facility] Note   Patient ID: [name], male   DOB: April 14, 1941, 80 y.o.   MRN: [id number] PCP: Dr. Lynnda [name] Cardiology: Dr [name]  80 y.o. with history of CAD s/p CABG and redo CABG as well as ischemic cardiomyopathy with CRT-D device presents for followup of CHF and CAD.  He had a Cardiolite in the 4/15 showing lateral wall ischemia.  LHC at that time showed all his vein grafts from both CABG surgeries occluded.  The LIMA-LAD was patent and there was a 90% distal LM stenosis.  The lateral ischemia likely corresponded to LCx territory downstream from the LM stenosis.  PCI of the distal LM was thought to be high risk and characteristics of the lesion were not favorable for PCI.  Last echo showed EF 25-30% with moderate RV systolic dysfunction.  He had RHC in 11/15 with normal filling pressures and relatively preserved cardiac index (2.55).    In 4/17, he was admitted with chest pain concerning for unstable angina.  Angiography showed 85% distal LM with 90% ostial LCx stenosis.  LIMA was patent but proximal LAD, proximal RCA, and all SVGs were totally occluded. High had successful DES to LM and PTCA to ostial LCx with Impella support.  [name] was stopped and he was put back on low dose lisinopril due to symptomatic hypotension.  He had Cardiolite in 11/17 with scar, no ischemia.   He was admitted in 1/19 with AKI, creatinine up to 3. Meds held then restarted.   Echo 3/19 with EF 30-35%.   He was admitted with syncope and AKI in 6/19.  He was thought to be dehydrated and orthostatic. Losartan stopped but eventually restarted.   He was noted to have prolonged atrial fibrillation by device check in 3/20, now on Eliquis.   Echo was done in 6/20, showing EF 25-30% with inferior/inferolateral AK, moderate LV dilation, mild LVH, mild MR, severe LAE.    CPX in 11/20 showed moderate functional limitation due to HF.   He went into atrial fibrillation in 6/21, had TEE-guided DCCV  in 6/21 back to NSR.  TEE showed EF 25-30%, moderately dilated LV, normal RV.   He was admitted in 4/22 with VT and ICD discharge.  RHC/LHC showed totally occluded SVGs, totally occluded RCA and LAD; the LIMA-LAD was patent and there was a patent LM sent with good flow into the LCx (no changes).  Filling pressure and cardiac output were normal. VT was thought to be scar-mediated.  Amiodarone was begun.  BP was soft in the hospital, meds were cut back.    Today he returns for HF follow up with his wife. Overall feeling fine. Dizzy with position changes and closing eyes. Working with Neuro PT, will see neurologist next month. Denies increasing SOB, CP, dizziness, edema, or PND/Orthopnea. Appetite poor.  Weight at home 175-178 pounds. BP 150s/70s at home, drops to 130's after standing. Taking all medications.   ECG (personally reviewed): NSR, BiV pacing  Medtronic device interrogation: Fluid index < threshold with stable thoracic impedence, no AT/AF, >99% BiV pacing, daily activity<1 hr.  Labs (9/15): LDL particle number 816, LDL 57, LFTs normal Labs (10/15): K 4.4, creatinine 1.4 Labs (11/15): K 4.3, creatinine 1.36 Labs (12/15): K 4.8, creatinine 1.34 Labs (3/16): K 4, creatinine 1.38, LDL 46, HDl 35 Labs (7/16): K 4, creatinine 1.39, BNP 211 Labs (03/03/15): K 4.1, creatinine 1.47, BNP 227, LDL 80, HDL 32 Labs (12/16): K 4.6, creatinine 1.12, LDL 68, HDL 34  Labs (4/17): K 3.4, creatinine 1.15 Labs (6/17): K 3.8, creatinine 1.67 Labs (7/17): K 4, creatinine 1.35 Labs (10/17): K 3.9, creatinine 1.31, LDL 45, HDL 30 Labs (2/18): K 4, creatinine 1.43 Labs (5/18): LDL 44, HDL 27 Labs (6/18): K 4.1, creatinine 1.49 Labs (12/18): K 4.4, creatinine 1.44 Labs (1/19): K 4.3, creatinine 1.48 Labs (2/19): K 3.7, creatinine 1.6 Labs (6/19): K 4, creatinine 1.5 Labs (9/19): K 4.4, creatinine 1.6, hgb 13.4 Labs (10/19): K 4.3, creatinine 1.44 Labs (1/20): K 4, creatinine 1.65 Labs (6/20): K 3.9,  creatinine 1.58, LDL 58, HDL 31, hgb 13.3 Labs (7/20): K 3.9, creatinine 1.5 Labs (10/20): K 4, creatinine 1.7 Labs (12/20): K 3.9, creatinine 1.56 Labs (5/21): K 4.1, creatinine 1.7 Labs (8/21): K 4.4, creatinine 1.66, LDL 57 Labs (4/22): creatinine 1.58, hgb 12.3, LFTs normal, TSH normal Labs (5/22): K 4.1, creatinine 1.74 Labs (6/22): K 4.2, creatinine 1.75 Labs (8/22): K 4.2, creatinine 1.66  PMH: 1. Gout 2. Hyperlipidemia 3. CAD: CABG 1997 and redo 11/12.   - LHC (4/15) with totally occluded LAD, totally occluded RCA, 80% distal LM, 50% mLCx, 2 SVG-RCA grafts totally occluded, 2 SVG-OM grafts totally occluded, patent LIMA-LAD.  Cardiolite prior to 4/15 cath showed lateral wall ischemia (LCx territory).  PCI to distal LM would be a high risk procedure.   - Cardiolite (8/16) with EF 20%, severe scar in RCA and probably LCx territory, minimal peri-infarct ischemia.   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  - Unstable angina 4/17: 85% distal LM with 90% ostial LCx stenosis.  LIMA was patent but proximal LAD, proximal RCA, and all SVGs were totally occluded. He had successful DES to LM and PTCA to ostial LCx with Impella support  - Cardiolite (11/17): EF 20%, prior MI, no ischemia.  - LHC (4/22): totally occluded SVGs, totally occluded RCA and LAD; the LIMA-LAD was patent and there was a patent LM sent with good flow into the LCx (no changes) 4. Ischemic Cardiomyopathy: Medtronic CRT-D device.   - Echo (6/15) with EF 25-30%, moderate LV dilation, inferior and inferolateral akinesis, moderately decreased RV systolic function, mild MR.   - RHC (11/15) with mean RA 5, PA 23/6, mean PCWP 9, CI 2.55.  - Echo (4/17): EF 25-30% with mild MR.  - Echo (11/17): EF 25-30%, moderately dilated LV, grade II diastolic dysfunction, mild-moderate MR, mildly decreased RV systolic function.  - Echo (3/19): EF 30-35%, inferior/inferolateral akinesis, normal RV size with  mildly decreased systolic function.  - Echo (6/20): EF 25-30% with inferior/inferolateral AK, moderate LV dilation, mild LVH, mild MR, severe LAE. - CPX (11/20): VO2 max 14.9 (60% predicted), VE/VCO2 slope 34, RER 1.05.  Moderate functional deficit due to HF.  - TEE (6/21): EF 25-30%, moderately dilated LV, normal RV. - RHC (4/22): mean RA 3, PA 23/3, mean PCWP 4, CI 3.13 Fick/3.25 thermo 5. H/o cholecystectomy 6. OA 7. Depression 8. Type II diabetes 9. GERD 10. CKD stage 3 11. ?Early dementia 12. Atrial fibrillation: Paroxysmal.  - DCCV to NSR in 6/21.  13. B12 deficiency.  14. VT in 4/22: Amiodarone started.   SH: Married, prior smoker (many years ago), lives in [place].    FH: CAD  ROS: All systems reviewed and negative except as per HPI.   Current Outpatient Medications  Medication Sig Dispense Refill   acetaminophen (TYLENOL) 500 MG tablet Take 1,000 mg by mouth every 6 (six) hours as needed for moderate pain.  allopurinol (ZYLOPRIM) 100 MG tablet Take 200 mg by mouth daily.     amiodarone (PACERONE) 200 MG tablet Take 1 tablet (200 mg total) by mouth daily. 30 tablet 6   atorvastatin (LIPITOR) 80 MG tablet TAKE 1 TABLET BY MOUTH ONCE DAILY AT  6  PM  IN  THE  EVENING 90 tablet 0   Continuous Blood Gluc Sensor (FREESTYLE LIBRE 14 DAY SENSOR) MISC USE TO CHECK GLUCOSE THREE TIMES DAILY AS DIRECTED     ELIQUIS 5 MG TABS tablet Take 1 tablet by mouth twice daily 60 tablet 6   furosemide (LASIX) 40 MG tablet Take 2 tablets (80 mg total) every morning and 1 tablet (40 mg total) every other evening. 225 tablet 3   insulin aspart protamine- aspart (NOVOLOG MIX 70/30) (70-30) 100 UNIT/ML injection Inject into the skin daily as needed. According to blood sugar     loperamide (IMODIUM A-D) 2 MG tablet Take 2 mg by mouth 4 (four) times daily as needed for diarrhea or loose stools.     metoprolol succinate (TOPROL XL) 25 MG 24 hr tablet Take 0.5 tablets (12.5 mg total) by mouth at  bedtime. 15 tablet 11   midodrine (PROAMATINE) 10 MG tablet Take 1.5 tablets (15 mg total) by mouth 3 (three) times daily with meals. 135 tablet 3   nitroGLYCERIN (NITROSTAT) 0.4 MG SL tablet Place 1 tablet (0.4 mg total) under the tongue every 5 (five) minutes as needed for chest pain. DO NOT EXCEED A TOTAL OF 3 DOSES IN 15 MINUTES 25 tablet 6   potassium chloride SA (KLOR-CON) 20 MEQ tablet Take 2 tablets (40 mEq total) by mouth daily. 60 tablet 6   QUEtiapine (SEROQUEL) 100 MG tablet Take 100 mg by mouth at bedtime. Taking 0.5 tablet at bedtime     ranolazine (RANEXA) 500 MG 12 hr tablet Take 500 mg by mouth 2 (two) times daily.     triamcinolone cream (KENALOG) 0.1 % Apply 1 application topically 2 (two) times daily as needed (for dry/irritated skin.).     venlafaxine (EFFEXOR-XR) 150 MG 24 hr capsule Take 150 mg by mouth daily with breakfast.     vitamin B-12 (CYANOCOBALAMIN) 1000 MCG tablet Take 1,000 mcg by mouth daily.     No current facility-administered medications for this encounter.   BP (!) 156/77   Pulse 64   Wt 76.9 kg (169 lb 9.6 oz)   SpO2 98%   BMI 25.05 kg/m    Wt Readings from Last 3 Encounters:  11/16/20 76.9 kg (169 lb 9.6 oz)  09/29/20 82.6 kg (182 lb)  09/07/20 79.9 kg (176 lb 4 oz)    Physical Exam: General:  NAD. No resp difficulty, elderly HEENT: Normal, + HOH Neck: Supple. No JVD. Carotids 2+ bilat; no bruits. No lymphadenopathy or thryomegaly appreciated. Cor: PMI nondisplaced. Regular rate & rhythm. No rubs, gallops or murmurs. Lungs: Clear Abdomen: Soft, nontender, nondistended. No hepatosplenomegaly. No bruits or masses. Good bowel sounds. Extremities: No cyanosis, clubbing, rash, edema Neuro: Alert & oriented x 3, cranial nerves grossly intact. Moves all 4 extremities w/o difficulty. Affect pleasant.  Assessment/Plan: 1. Chronic systolic CHF: Ischemic cardiomyopathy. He has a Medtronic CRT-D device.  CPX in 11/20 showed moderate functional  limitation due to CHF.  TEE in 6/21 with EF 25-30%, normal RV.  RHC in 4/22 showed normal filling pressures and normal cardiac output.  Previous orthostatic symptoms improved after cutting back on BP- active meds and starting midodrine. NYHA III, he  is not overloaded on exam or by OptiVol. - Continue Lasix 80 mg daily w/ additional 40 mg every other evening.  BMET today.         - Wear graded compression stockings and abdominal binder as much as possible.  - I think we can decrease midodrine to 10 mg tid.  - He can continue Toprol XL 12.5 mg qhs.  2. CAD: s/p CABG and redo. Had DES to distal LM, angioplasty to ostial LCx in 4/17. Cardiolite 11/17 with scar, no ischemia. LHC in 4/22 showed patent LIMA-LAD and patent LM stent with good flow down LCx, no interventional target.  - Continue statin.  - He is off ASA/Plavix and taking apixaban 5 mg bid.  - Continue ranolazine 500 mg bid.  3. CKD stage III: BMET today.    4. Hyperlipidemia: Continue statin. Good lipids in 3/22.  5. Atrial fibrillation: Paroxysmal atrial fibrillation.  NSR today.  - Continue apixaban.   - Continue amiodarone 200 mg daily, LFTs and TSH (6/22) ok.  Will need regular eye exam.  6. VT: In 4/22, unchanged coronaries/grafts and optimized filling pressures/cardiac output.  Think scar-mediated.  Amiodarone begun.  - Continue amiodarone 200 mg daily.  7. Neuro: Patient had some symptoms suggestive of parkinsonism.  He "freezes up." Slow movement.  No obvious tremor.  - Continue with Neuro PT. 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 - Formal visit with Neurology scheduled for next month.  Followup in 6-8 weeks with Dr. Aundra [name].  Burnett FNP 11/16/2020

## 2020-11-16 ENCOUNTER — Ambulatory Visit (HOSPITAL_COMMUNITY)
Admission: RE | Admit: 2020-11-16 | Discharge: 2020-11-16 | Disposition: A | Discharge status: Home / Self Care | Payer: Medicare HMO | Source: Ambulatory Visit | Attending: Family Medicine | Admitting: Family Medicine

## 2020-11-16 ENCOUNTER — Encounter (HOSPITAL_COMMUNITY): Payer: Self-pay

## 2020-11-16 VITALS — BP 156/77 | HR 64 | Wt 169.6 lb

## 2020-11-16 DIAGNOSIS — I255 Ischemic cardiomyopathy: Secondary | ICD-10-CM | POA: Insufficient documentation

## 2020-11-16 DIAGNOSIS — Z87891 Personal history of nicotine dependence: Secondary | ICD-10-CM | POA: Insufficient documentation

## 2020-11-16 DIAGNOSIS — Z794 Long term (current) use of insulin: Secondary | ICD-10-CM | POA: Diagnosis not present

## 2020-11-16 DIAGNOSIS — N183 Chronic kidney disease, stage 3 unspecified: Secondary | ICD-10-CM | POA: Insufficient documentation

## 2020-11-16 DIAGNOSIS — N1831 Chronic kidney disease, stage 3a: Secondary | ICD-10-CM | POA: Diagnosis not present

## 2020-11-16 DIAGNOSIS — I2510 Atherosclerotic heart disease of native coronary artery without angina pectoris: Secondary | ICD-10-CM

## 2020-11-16 DIAGNOSIS — Z79899 Other long term (current) drug therapy: Secondary | ICD-10-CM | POA: Insufficient documentation

## 2020-11-16 DIAGNOSIS — E1122 Type 2 diabetes mellitus with diabetic chronic kidney disease: Secondary | ICD-10-CM | POA: Diagnosis not present

## 2020-11-16 DIAGNOSIS — I5022 Chronic systolic (congestive) heart failure: Secondary | ICD-10-CM | POA: Insufficient documentation

## 2020-11-16 DIAGNOSIS — Z7901 Long term (current) use of anticoagulants: Secondary | ICD-10-CM | POA: Diagnosis not present

## 2020-11-16 DIAGNOSIS — Z955 Presence of coronary angioplasty implant and graft: Secondary | ICD-10-CM | POA: Insufficient documentation

## 2020-11-16 DIAGNOSIS — E785 Hyperlipidemia, unspecified: Secondary | ICD-10-CM | POA: Diagnosis not present

## 2020-11-16 DIAGNOSIS — I480 Paroxysmal atrial fibrillation: Secondary | ICD-10-CM | POA: Diagnosis not present

## 2020-11-16 DIAGNOSIS — I472 Ventricular tachycardia, unspecified: Secondary | ICD-10-CM

## 2020-11-16 DIAGNOSIS — Z95810 Presence of automatic (implantable) cardiac defibrillator: Secondary | ICD-10-CM | POA: Insufficient documentation

## 2020-11-16 DIAGNOSIS — Z8249 Family history of ischemic heart disease and other diseases of the circulatory system: Secondary | ICD-10-CM | POA: Insufficient documentation

## 2020-11-16 DIAGNOSIS — R42 Dizziness and giddiness: Secondary | ICD-10-CM

## 2020-11-16 DIAGNOSIS — I5042 Chronic combined systolic (congestive) and diastolic (congestive) heart failure: Secondary | ICD-10-CM

## 2020-11-16 LAB — BASIC METABOLIC PANEL
Anion gap: 9 (ref 5–15)
BUN: 25 mg/dL — ABNORMAL HIGH (ref 8–23)
CO2: 29 mmol/L (ref 22–32)
Calcium: 9 mg/dL (ref 8.9–10.3)
Chloride: 98 mmol/L (ref 98–111)
Creatinine, Ser: 1.66 mg/dL — ABNORMAL HIGH (ref 0.61–1.24)
GFR, Estimated: 42 mL/min — ABNORMAL LOW (ref >60)
Glucose, Bld: 169 mg/dL — ABNORMAL HIGH (ref 70–99)
Potassium: 4.2 mmol/L (ref 3.5–5.1)
Sodium: 136 mmol/L (ref 135–145)

## 2020-11-16 MED ORDER — NITROGLYCERIN 0.4 MG SL SUBL: 0.4000 mg | SUBLINGUAL_TABLET | SUBLINGUAL | 6 refills | Status: AC | PRN

## 2020-11-16 MED ORDER — MIDODRINE HCL 10 MG PO TABS: 10.0000 mg | ORAL_TABLET | Freq: Three times a day (TID) | ORAL | 3 refills | Status: DC

## 2020-11-16 NOTE — Patient Instructions (Signed)
DECREASE Midodrine to 10 mg, one tab three times daily  Labs today We will only contact you if something comes back abnormal or we need to make some changes. Otherwise no news is good news!  Your physician recommends that you schedule a follow-up appointment in: 2-3 months with Dr Aundra Dubin  Do the following things EVERYDAY: Weigh yourself in the morning before breakfast. Write it down and keep it in a log. Take your medicines as prescribed Eat low salt foods--Limit salt (sodium) to 2000 mg per day.  Stay as active as you can everyday Limit all fluids for the day to less than 2 liters   milAt the [facility], you and your health needs are our priority. As part of our continuing mission to provide you with exceptional heart care, we have created designated Provider Care Teams. These Care Teams include your primary Cardiologist (physician) and Advanced Practice Providers (APPs- Physician Assistants and Nurse Practitioners) who all work together to provide you with the care you need, when you need it.   You may see any of the following providers on your designated Care Team at your next follow up: Dr Glori Bickers Dr [name] Dr Patrice Paradise, NP Lyda Jester, [place] Ginnie Smart Audry Riles, PharmD   Please be sure to bring in all your medications bottles to every appointment.   If you have any questions or concerns before your next appointment please send [place] a message through [place] or call our office at [phone].    TO LEAVE A MESSAGE FOR THE NURSE SELECT OPTION 2, PLEASE LEAVE A MESSAGE INCLUDING: YOUR NAME DATE OF BIRTH CALL BACK NUMBER REASON FOR CALL**this is important as we prioritize the call backs  YOU WILL RECEIVE A CALL BACK THE SAME DAY AS LONG AS YOU CALL BEFORE 4:00 PM

## 2020-11-22 ENCOUNTER — Other Ambulatory Visit: Payer: Self-pay

## 2020-11-22 ENCOUNTER — Ambulatory Visit: Payer: Medicare HMO | Admitting: Physical Therapy

## 2020-11-22 ENCOUNTER — Encounter: Payer: Self-pay | Admitting: Physical Therapy

## 2020-11-22 DIAGNOSIS — M6281 Muscle weakness (generalized): Secondary | ICD-10-CM | POA: Diagnosis not present

## 2020-11-22 DIAGNOSIS — Z9181 History of falling: Secondary | ICD-10-CM | POA: Diagnosis not present

## 2020-11-22 DIAGNOSIS — R2689 Other abnormalities of gait and mobility: Secondary | ICD-10-CM | POA: Diagnosis not present

## 2020-11-22 DIAGNOSIS — R2681 Unsteadiness on feet: Secondary | ICD-10-CM

## 2020-11-22 NOTE — Therapy (Signed)
[facility] [address] [place], [place], [address] Phone: [phone]   Fax:  [phone]  Physical Therapy Treatment  Patient Details  Name: [name] MRN: [id number] Date of Birth: 20-Feb-1941 Referring Provider (PT): Mayra Neer, MD   Encounter Date: 11/22/2020   PT End of Session - 11/22/20 1215     Visit Number 4    Number of Visits 9    Date for PT Re-Evaluation 01/13/21    Authorization Type [employer]- $35 co pay    PT Start Time 0933    PT Stop Time 1017    PT Time Calculation (min) 44 min    Equipment Utilized During Treatment Gait belt    Activity Tolerance Patient tolerated treatment well    Behavior During Therapy [facility] for tasks assessed/performed             Past Medical History:  Diagnosis Date   CAD (coronary artery disease)    a. s/p CABG 1997 b. PCI (BMS) of SVG to RCA 3/09 c. redo CABG 02/2011 with SVG to PDA, SVG to Lcx d. Cath 07/2013: ischemic cardiomyopathy with LVEF less than 20%, occlusion of the SVG's placed in 2012 e.stenting of LM in 07/2015 with PCI of LCx performed as well   CHF (congestive heart failure) (HCC)    Chronic systolic dysfunction of left ventricle    EF 30%   Depression    DJD (degenerative joint disease)    DM (diabetes mellitus) ([place]) 02/14/2011   GERD (gastroesophageal reflux disease)    Gout    HTN (hypertension) 02/14/2011   Hyperlipemia    Ischemic cardiomyopathy    s/p ICD Implantation by Dr Leonia Reeves   Paroxysmal atrial fibrillation ([place]) 10/22/2014   single episode of AF x 3 hours 48 minutes recorded on ICD, [email or web address] score is at least 5    Renal disorder    Ventricular tachycardia ([place]) 07/23/2020   appropriate ICD therapy delivered for VT at 222 bpm    Past Surgical History:  Procedure Laterality Date   BI-VENTRICULAR IMPLANTABLE CARDIOVERTER DEFIBRILLATOR UPGRADE N/A 08/24/2011   Procedure: BI-VENTRICULAR IMPLANTABLE CARDIOVERTER  DEFIBRILLATOR UPGRADE;  Surgeon: Evans Lance, MD;  Location: [facility] CATH LAB;  Service: Cardiovascular;  Laterality: N/A;   BIV ICD GENERATOR CHANGEOUT N/A 02/13/2018   Procedure: BIV ICD GENERATOR CHANGEOUT;  Surgeon: Thompson Grayer, MD;  Location: [place] CV LAB;  Service: Cardiovascular;  Laterality: N/A;   CARDIAC CATHETERIZATION  08/03/2015   Procedure: Left Heart Cath and Cors/Grafts Angiography;  Surgeon: Peter M Martinique, MD;  Location: [place] CV LAB;  Service: Cardiovascular;;   CARDIAC CATHETERIZATION N/A 08/06/2015   Procedure: Coronary Stent Intervention w/Impella;  Surgeon: Jettie Booze, MD;  Location: [place] CV LAB;  Service: Cardiovascular;  Laterality: N/A;   CARDIAC CATHETERIZATION  08/06/2015   Procedure: Left Heart Cath;  Surgeon: Jettie Booze, MD;  Location: [place] CV LAB;  Service: Cardiovascular;;   CARDIAC CATHETERIZATION  08/06/2015   Procedure: Coronary Balloon Angioplasty;  Surgeon: Jettie Booze, MD;  Location: [place] CV LAB;  Service: Cardiovascular;;   CARDIAC DEFIBRILLATOR PLACEMENT  08/2004   initial placement, upgraded to [place] ICD by Dr Lovena Le 08/24/11 (MDT)   CARDIOVERSION N/A 09/26/2019   Procedure: CARDIOVERSION;  Surgeon: Larey Dresser, MD;  Location: [facility] ENDOSCOPY;  Service: Cardiovascular;  Laterality: N/A;   CATARACT EXTRACTION W/ INTRAOCULAR LENS  IMPLANT, BILATERAL  2012   CHOLECYSTECTOMY  01/05/2012   Procedure:  LAPAROSCOPIC CHOLECYSTECTOMY WITH INTRAOPERATIVE CHOLANGIOGRAM;  Surgeon: Joyice Faster. Cornett, MD;  Location: [place];  Service: General;  Laterality: N/A;  laparoscopic cholecysectoym with intraoperative cholangiogram   COLONOSCOPY WITH PROPOFOL N/A 11/18/2013   Procedure: COLONOSCOPY WITH PROPOFOL;  Surgeon: Garlan Fair, MD;  Location: [facility] ENDOSCOPY;  Service: Endoscopy;  Laterality: N/A;   CORONARY ANGIOPLASTY WITH STENT PLACEMENT  06/2007   BMS to SVG to RCA   CORONARY ARTERY BYPASS GRAFT  01/1996   CABG x 5  LIMA to LAD SVG to diag1,2,svg to om,svg to RCA   CORONARY ARTERY BYPASS GRAFT  03/06/2011   CABG X2; Procedure: REDO CORONARY ARTERY BYPASS GRAFTING (CABG);  Surgeon: Grace Isaac, MD;  Location: [place];  Service: Open Heart Surgery;  Laterality: N/A;  times two grafts using right saphenous vein harvested endoscopically.   KNEE ARTHROTOMY  ~ 1978   RIGHT KNEE CARTILAGE REMOVED   LEFT HEART CATHETERIZATION WITH CORONARY ANGIOGRAM N/A 06/06/2011   Procedure: LEFT HEART CATHETERIZATION WITH CORONARY ANGIOGRAM;  Surgeon: Sueanne Margarita, MD;  Location: [place] CATH LAB;  Service: Cardiovascular;  Laterality: N/A;   LEFT HEART CATHETERIZATION WITH CORONARY/GRAFT ANGIOGRAM N/A 08/08/2013   Procedure: LEFT HEART CATHETERIZATION WITH Beatrix Fetters;  Surgeon: Sinclair Grooms, MD;  Location: [facility] CATH LAB;  Service: Cardiovascular;  Laterality: N/A;   LUMBAR DISC SURGERY  2003   RIGHT HEART CATHETERIZATION N/A 02/17/2014   Procedure: RIGHT HEART CATH;  Surgeon: Larey Dresser, MD;  Location: [facility] CATH LAB;  Service: Cardiovascular;  Laterality: N/A;   RIGHT/LEFT HEART CATH AND CORONARY/GRAFT ANGIOGRAPHY N/A 07/26/2020   Procedure: RIGHT/LEFT HEART CATH AND CORONARY/GRAFT ANGIOGRAPHY;  Surgeon: Larey Dresser, MD;  Location: [place] CV LAB;  Service: Cardiovascular;  Laterality: N/A;   TEE WITHOUT CARDIOVERSION N/A 09/26/2019   Procedure: TRANSESOPHAGEAL ECHOCARDIOGRAM (TEE);  Surgeon: Larey Dresser, MD;  Location: [facility] ENDOSCOPY;  Service: Cardiovascular;  Laterality: N/A;   VENOGRAM N/A 06/08/2011   Procedure: VENOGRAM;  Surgeon: Thompson Grayer, MD;  Location: [facility] CATH LAB;  Service: Cardiovascular;  Laterality: N/A;    There were no vitals filed for this visit.   Subjective Assessment - 11/22/20 0939     Subjective Had a fall last night - was in the bathroom and went to bend over to pick up his cane. Pt's spouse had to call her daughter to help get him up.    Patient is accompained by: Family  member   wife   Pertinent History PMH: chronic systolic CHF, CAD s/p CABG and redo, CKD stage III, HLD, a fib, DM, implantable cardioverter-defibrillator    Limitations Walking;House hold activities;Standing    Patient Stated Goals improve the balance - does not want to use a cane.    Currently in Pain? No/denies                               [facility] Adult PT Treatment/Exercise - 11/22/20 0943       Transfers   Transfers Sit to Stand;Stand to Sit    Sit to Stand 5: Supervision;With upper extremity assist    Five time sit to stand comments  22.4 seconds with BUE support from mat table    Stand to Sit 5: Supervision   2 x 5 reps performed with cues for posture. Pt needs cues for proper brake management with sit <> stands      Ambulation/Gait   Ambulation/Gait Yes  Ambulation/Gait Assistance 5: Supervision    Ambulation/Gait Assistance Details with rollator during session    Assistive device Rollator    Gait Pattern Step-through pattern;Decreased step length - right;Decreased step length - left;Decreased arm swing - left;Decreased hip/knee flexion - right;Decreased hip/knee flexion - left;Trunk flexed    Ambulation Surface Level;Indoor      Therapeutic Activites    Therapeutic Activities Other Therapeutic Activities    Other Therapeutic Activities due to pt's recent fall, reiterated making sure that pt is using his rollator at all times for safety (vs. using a cane) and to have his life alert necklace on at all times. pt also reported incr in ringing in his ears (per wife pt has had that since he was a teenager due to an accident) and has had incr ear wax/build up in his ears and one time it was bloody. discussed that he might benefit from a referral to ENT and pt would need to get this referral from his PCP                 Balance Exercises - 11/22/20 0957       Balance Exercises: Standing   Standing Eyes Opened Wide ([place]);Foam/compliant  surface;Limitations    Standing Eyes Opened Limitations on blue air ex, static stance x30 seconds, alternating UE lifts x5 reps B, trunk rotations tapping target x8 reps B with incr difficulty towards L, min guard/min A for balance    Standing Eyes Closed Solid surface;Limitations    Standing Eyes Closed Limitations slightly more narrow than hip width 3 x 30 seconds with min guard for balance, cues first for posture    SLS with Vectors Solid surface;Limitations    SLS with Vectors Limitations alternating foot taps without UE support x10 reps B to 4" step    Retro Gait 3 reps;Limitations    Retro Gait Limitations down and back 3 times with no UE support, cues for step length    Marching Solid surface;Upper extremity assist 2;Forwards    Marching Limitations down and back 3 reps, cues for posture and slowed pace               PT Education - 11/22/20 1215     Education Details see TA    Person(s) Educated Patient;Spouse    Methods Explanation    Comprehension Verbalized understanding              PT Short Term Goals - 11/22/20 1219       PT SHORT TERM GOAL #1   Title Pt will finish assessment of BERG with LTG written as appropriate. ALL STGS DUE 11/13/20    Baseline 32/56 on7/25/22    Time 4    Period Weeks    Status Achieved    Target Date 12/14/20      PT SHORT TERM GOAL #2   Title Pt and pt's spouse will verbalize understanding of fall prevention in the home.    Baseline provided handout on 11/15/20 and discussed with pt and pt's spouse    Time 4    Period Weeks    Status Achieved      PT SHORT TERM GOAL #3   Title Pt will decr 5x sit <> stand time to 23 seconds or less with BUE support in order to demo improved functional BLE strength for transfers and decr fall risk.    Baseline 22.4 seconds with BUE support from mat table on 11/22/20    Time 4  Period Weeks    Status Achieved      PT SHORT TERM GOAL #4   Title Pt will improve gait speed to at least 2.0 ft/sec  with LRAD in order to demo improved community mobility.    Baseline 1.84 ft/sec    Time 4    Period Weeks    Status New               PT Long Term Goals - 11/08/20 1214       PT LONG TERM GOAL #1   Title Pt will be independent with final HEP with wife's supervision in order to build upon functional gains made in therapy. ALL LTGS DUE 12/14/20    Time 8    Period Weeks    Status New      PT LONG TERM GOAL #2   Title Pt will improve BERG to at least a 39/56 in order to demo decr fall risk.    Baseline 32/56    Time 8    Period Weeks    Status Revised      PT LONG TERM GOAL #3   Title Pt will decr 5x sit <> stand time to 18 seconds or less  with BUE support in order to demo decr fall risk.    Baseline 26.38 seconds with BUE support.    Time 8    Period Weeks    Status New      PT LONG TERM GOAL #4   Title Pt will recover posterior and anterior balance in push and release test in 2 or less robust steps independently, for improved balance recovery    Baseline anterior = unable to ellicit, posterior = 2 delayed small steps.    Time 8    Period Weeks    Status New      PT LONG TERM GOAL #5   Title Pt will improve gait speed to at least 2.12f/sec with LRAD in order to demo improved community mobility.    Baseline 1.84 ft/sec with SPC.    Time 8    Period Weeks    Status New                   Plan - 11/22/20 1217     Clinical Impression Statement Pt met STG #3 in regards to 5x sit <> stand today, performed in 22.4 seconds with BUE support from mat table (previously 26.38 seconds). Focused on reviewing fall prevention educated and working on standing balance strategies in // bars and began to incorporate compliant surfaces. Pt needing cues throughout session for posture with balance activities. Will continue to progress towards LTGs    Personal Factors and Comorbidities Past/Current Experience;Comorbidity 3+    Comorbidities chronic systolic CHF, CAD s/p CABG and  redo, CKD stage III, HLD, a fib, DM    Examination-Activity Limitations Stairs;Transfers;Locomotion Level;Squat    Examination-Participation Restrictions Community Activity;Driving;Yard Work    SMerchant navy officerEvolving/Moderate complexity    Rehab Potential Good    PT Frequency 1x / week    PT Duration 12 weeks   8 visits over 12 weeks   PT Treatment/Interventions ADLs/Self Care Home Management;DME Instruction;Gait training;Stair training;Functional mobility training;Therapeutic activities;Neuromuscular re-education;Balance training;Therapeutic exercise;Patient/family education;Vestibular    PT Next Visit Plan . how is HEP? SLS and narrow BOS, functional BLE strength, working on incr foot clearance activities.  work on stepping strategies, gait with rollator.    PT HAcadiaand  Agree with Plan of Care Patient;Family member/caregiver    Family Member Consulted spouse, [name]             Patient will benefit from skilled therapeutic intervention in order to improve the following deficits and impairments:  Abnormal gait, Decreased activity tolerance, Decreased coordination, Decreased balance, Decreased endurance, Decreased strength, Impaired sensation, Postural dysfunction, Decreased knowledge of use of DME  Visit Diagnosis: Unsteadiness on feet  Muscle weakness (generalized)  Other abnormalities of gait and mobility     Problem List Patient Active Problem List   Diagnosis Date Noted   Ventricular fibrillation ([place]) 07/23/2020   Syncope and collapse 09/24/2017   GERD (gastroesophageal reflux disease) 09/24/2017   Type 2 diabetes mellitus ([place]) 09/24/2017   Chronic combined systolic and diastolic CHF (congestive heart failure) ([place]) 09/24/2017   Acute renal failure superimposed on stage 3 chronic kidney disease ([place]) 09/24/2017   Syncope    Acute kidney injury (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 [place], [place], [address] Phone: [phone]   Fax:  [phone]  Name: [name] MRN: [id number] Date of Birth: 07-14-1940

## 2020-11-24 ENCOUNTER — Other Ambulatory Visit: Payer: Self-pay

## 2020-11-24 ENCOUNTER — Emergency Department (HOSPITAL_COMMUNITY)
Admission: EM | Admit: 2020-11-24 | Discharge: 2020-11-24 | Disposition: A | Discharge status: Home / Self Care | Payer: Medicare HMO | Attending: Emergency Medicine | Admitting: Emergency Medicine

## 2020-11-24 ENCOUNTER — Emergency Department (HOSPITAL_COMMUNITY): Payer: Medicare HMO

## 2020-11-24 ENCOUNTER — Encounter (HOSPITAL_COMMUNITY): Payer: Self-pay | Admitting: Emergency Medicine

## 2020-11-24 DIAGNOSIS — R4182 Altered mental status, unspecified: Secondary | ICD-10-CM | POA: Diagnosis not present

## 2020-11-24 DIAGNOSIS — W19XXXA Unspecified fall, initial encounter: Secondary | ICD-10-CM | POA: Diagnosis not present

## 2020-11-24 DIAGNOSIS — R296 Repeated falls: Secondary | ICD-10-CM | POA: Diagnosis not present

## 2020-11-24 DIAGNOSIS — I6782 Cerebral ischemia: Secondary | ICD-10-CM | POA: Diagnosis not present

## 2020-11-24 DIAGNOSIS — S2232XA Fracture of one rib, left side, initial encounter for closed fracture: Secondary | ICD-10-CM | POA: Insufficient documentation

## 2020-11-24 DIAGNOSIS — I480 Paroxysmal atrial fibrillation: Secondary | ICD-10-CM | POA: Insufficient documentation

## 2020-11-24 DIAGNOSIS — I5042 Chronic combined systolic (congestive) and diastolic (congestive) heart failure: Secondary | ICD-10-CM | POA: Diagnosis not present

## 2020-11-24 DIAGNOSIS — I10 Essential (primary) hypertension: Secondary | ICD-10-CM | POA: Diagnosis not present

## 2020-11-24 DIAGNOSIS — T1490XA Injury, unspecified, initial encounter: Secondary | ICD-10-CM

## 2020-11-24 DIAGNOSIS — R109 Unspecified abdominal pain: Secondary | ICD-10-CM | POA: Insufficient documentation

## 2020-11-24 DIAGNOSIS — E1122 Type 2 diabetes mellitus with diabetic chronic kidney disease: Secondary | ICD-10-CM | POA: Diagnosis not present

## 2020-11-24 DIAGNOSIS — Z9181 History of falling: Secondary | ICD-10-CM | POA: Diagnosis not present

## 2020-11-24 DIAGNOSIS — Z888 Allergy status to other drugs, medicaments and biological substances status: Secondary | ICD-10-CM | POA: Insufficient documentation

## 2020-11-24 DIAGNOSIS — I130 Hypertensive heart and chronic kidney disease with heart failure and stage 1 through stage 4 chronic kidney disease, or unspecified chronic kidney disease: Secondary | ICD-10-CM | POA: Insufficient documentation

## 2020-11-24 DIAGNOSIS — S299XXA Unspecified injury of thorax, initial encounter: Secondary | ICD-10-CM | POA: Diagnosis present

## 2020-11-24 DIAGNOSIS — N179 Acute kidney failure, unspecified: Secondary | ICD-10-CM | POA: Insufficient documentation

## 2020-11-24 DIAGNOSIS — Z794 Long term (current) use of insulin: Secondary | ICD-10-CM | POA: Insufficient documentation

## 2020-11-24 DIAGNOSIS — N3289 Other specified disorders of bladder: Secondary | ICD-10-CM | POA: Diagnosis not present

## 2020-11-24 DIAGNOSIS — I951 Orthostatic hypotension: Secondary | ICD-10-CM

## 2020-11-24 DIAGNOSIS — I700 Atherosclerosis of aorta: Secondary | ICD-10-CM | POA: Insufficient documentation

## 2020-11-24 DIAGNOSIS — N183 Chronic kidney disease, stage 3 unspecified: Secondary | ICD-10-CM | POA: Diagnosis not present

## 2020-11-24 DIAGNOSIS — R079 Chest pain, unspecified: Secondary | ICD-10-CM | POA: Diagnosis not present

## 2020-11-24 DIAGNOSIS — Z885 Allergy status to narcotic agent status: Secondary | ICD-10-CM | POA: Insufficient documentation

## 2020-11-24 DIAGNOSIS — Z8249 Family history of ischemic heart disease and other diseases of the circulatory system: Secondary | ICD-10-CM | POA: Insufficient documentation

## 2020-11-24 DIAGNOSIS — Z79899 Other long term (current) drug therapy: Secondary | ICD-10-CM | POA: Diagnosis not present

## 2020-11-24 DIAGNOSIS — Z95810 Presence of automatic (implantable) cardiac defibrillator: Secondary | ICD-10-CM | POA: Insufficient documentation

## 2020-11-24 DIAGNOSIS — Z9049 Acquired absence of other specified parts of digestive tract: Secondary | ICD-10-CM | POA: Diagnosis not present

## 2020-11-24 DIAGNOSIS — R251 Tremor, unspecified: Secondary | ICD-10-CM | POA: Diagnosis not present

## 2020-11-24 DIAGNOSIS — Z87891 Personal history of nicotine dependence: Secondary | ICD-10-CM | POA: Insufficient documentation

## 2020-11-24 DIAGNOSIS — Z951 Presence of aortocoronary bypass graft: Secondary | ICD-10-CM | POA: Diagnosis not present

## 2020-11-24 DIAGNOSIS — Z955 Presence of coronary angioplasty implant and graft: Secondary | ICD-10-CM | POA: Insufficient documentation

## 2020-11-24 DIAGNOSIS — Z743 Need for continuous supervision: Secondary | ICD-10-CM | POA: Diagnosis not present

## 2020-11-24 DIAGNOSIS — I2510 Atherosclerotic heart disease of native coronary artery without angina pectoris: Secondary | ICD-10-CM | POA: Insufficient documentation

## 2020-11-24 DIAGNOSIS — Z7901 Long term (current) use of anticoagulants: Secondary | ICD-10-CM | POA: Insufficient documentation

## 2020-11-24 DIAGNOSIS — E860 Dehydration: Secondary | ICD-10-CM | POA: Diagnosis not present

## 2020-11-24 DIAGNOSIS — S2242XA Multiple fractures of ribs, left side, initial encounter for closed fracture: Secondary | ICD-10-CM | POA: Diagnosis not present

## 2020-11-24 LAB — CBC
HCT: 42.5 % (ref 39.0–52.0)
Hemoglobin: 13.7 g/dL (ref 13.0–17.0)
MCH: 33.3 pg (ref 26.0–34.0)
MCHC: 32.2 g/dL (ref 30.0–36.0)
MCV: 103.2 fL — ABNORMAL HIGH (ref 80.0–100.0)
Platelets: 285 10*3/uL (ref 150–400)
RBC: 4.12 MIL/uL — ABNORMAL LOW (ref 4.22–5.81)
RDW: 14.8 % (ref 11.5–15.5)
WBC: 7.7 10*3/uL (ref 4.0–10.5)
nRBC: 0 % (ref 0.0–0.2)

## 2020-11-24 LAB — URINALYSIS, ROUTINE W REFLEX MICROSCOPIC
Bilirubin Urine: NEGATIVE
Glucose, UA: NEGATIVE mg/dL
Hgb urine dipstick: NEGATIVE
Ketones, ur: NEGATIVE mg/dL
Leukocytes,Ua: NEGATIVE
Nitrite: NEGATIVE
Protein, ur: NEGATIVE mg/dL
Specific Gravity, Urine: 1.012 (ref 1.005–1.030)
pH: 5 (ref 5.0–8.0)

## 2020-11-24 LAB — BASIC METABOLIC PANEL
Anion gap: 11 (ref 5–15)
BUN: 29 mg/dL — ABNORMAL HIGH (ref 8–23)
CO2: 27 mmol/L (ref 22–32)
Calcium: 8.9 mg/dL (ref 8.9–10.3)
Chloride: 98 mmol/L (ref 98–111)
Creatinine, Ser: 2.02 mg/dL — ABNORMAL HIGH (ref 0.61–1.24)
GFR, Estimated: 33 mL/min — ABNORMAL LOW (ref >60)
Glucose, Bld: 170 mg/dL — ABNORMAL HIGH (ref 70–99)
Potassium: 3.9 mmol/L (ref 3.5–5.1)
Sodium: 136 mmol/L (ref 135–145)

## 2020-11-24 LAB — TROPONIN I (HIGH SENSITIVITY)
Troponin I (High Sensitivity): 20 ng/L — ABNORMAL HIGH (ref <18)
Troponin I (High Sensitivity): 18 ng/L — ABNORMAL HIGH (ref <18)

## 2020-11-24 MED ORDER — MIDODRINE HCL 10 MG PO TABS: 15.0000 mg | ORAL_TABLET | Freq: Three times a day (TID) | ORAL | 0 refills | Status: AC

## 2020-11-24 MED ORDER — SODIUM CHLORIDE 0.9 % IV BOLUS
500.0000 mL | Freq: Once | INTRAVENOUS | Status: AC
  Administered 2020-11-24: 500 mL via INTRAVENOUS

## 2020-11-24 MED ORDER — HYDROCODONE-ACETAMINOPHEN 5-325 MG PO TABS: 1.0000 | ORAL_TABLET | ORAL | 0 refills | Status: AC | PRN

## 2020-11-24 MED ORDER — FUROSEMIDE 40 MG PO TABS: ORAL_TABLET | ORAL | 3 refills | Status: DC

## 2020-11-24 MED ORDER — MIDODRINE HCL 5 MG PO TABS
15.0000 mg | ORAL_TABLET | Freq: Once | ORAL | Status: AC
  Administered 2020-11-24: 15 mg via ORAL
  Filled 2020-11-24: qty 3

## 2020-11-24 NOTE — ED Notes (Signed)
Report taken from [place] at [facility]

## 2020-11-24 NOTE — Discharge Instructions (Addendum)
Use compression hose.    Use your walker with ambulation.  Stop metoprolol.  Increase Midodrine to 15 mg TID.  Only take evening lasix if short of breath or if you have weight gain/fluid build up.

## 2020-11-24 NOTE — ED Notes (Signed)
Incentive spirometer provided, eduction given. Pt was able to demonstrate appropriate use. Wife at bedside.

## 2020-11-24 NOTE — ED Notes (Signed)
Orthostatic VS after 1 L of IVF  Lying PR 66 BP 150/66 Sitting PR 66 BP 143/59 Standing 0 min PR 69 BP 112/56 Standing 3 min PR 68 BP 113/59  Pt continues to c/o tremors and dizziness while changing position. Dr Gilford Raid informed of VS

## 2020-11-24 NOTE — ED Notes (Signed)
Pt c/o increasing pain to left side. On assessment no bruising noted, but has tenderness to LUQ. Pt's wife at bedside states he fell and landed on a step outside the shower on that left side. Dr. Gilford Raid aware.

## 2020-11-24 NOTE — ED Notes (Signed)
Patient transported to CT 

## 2020-11-24 NOTE — ED Notes (Signed)
Faxed [facility] Inter[facility] report given to Dr Gilford Raid. Report from rep taken by [facility]

## 2020-11-24 NOTE — ED Provider Notes (Signed)
[facility] EMERGENCY DEPARTMENT Provider Note   CSN: [id number] Arrival date & time: 11/24/20  1617     History Chief Complaint  Patient presents with   Chest Pain    [name] is a 80 y.o. male.  Pt presents to the ED today with multiple falls.  Pt has a hx of orthostatic hypotension and is supposed to get up with someone by him, but he does not always do this.  Pt's wife said she can't pick him up and has to have a neighbor high school boy help her get him up.  Pt has been seen by pcp and cardiology for this problem.  Pt recently saw cardiology on 8/2.  It sounds like there was some miscommunication because the provider decreased his midodrine and noted that pt has not had problems with dizziness.  The wife said she told them that he had been having dizziness.  Last night, pt fell and hit the front of his chest and abdomen.  He is on eliquis for afib.  He was orthostatic at home down to sbp 77.      Past Medical History:  Diagnosis Date   CAD (coronary artery disease)    a. s/p CABG 1997 b. PCI (BMS) of SVG to RCA 3/09 c. redo CABG 02/2011 with SVG to PDA, SVG to Lcx d. Cath 07/2013: ischemic cardiomyopathy with LVEF less than 20%, occlusion of the SVG's placed in 2012 e.stenting of LM in 07/2015 with PCI of LCx performed as well   CHF (congestive heart failure) (HCC)    Chronic systolic dysfunction of left ventricle    EF 30%   Depression    DJD (degenerative joint disease)    DM (diabetes mellitus) ([place]) 02/14/2011   GERD (gastroesophageal reflux disease)    Gout    HTN (hypertension) 02/14/2011   Hyperlipemia    Ischemic cardiomyopathy    s/p ICD Implantation by Dr Leonia Reeves   Paroxysmal atrial fibrillation ([place]) 10/22/2014   single episode of AF x 3 hours 48 minutes recorded on ICD, [email or web address] score is at least 5    Renal disorder    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   appropriate ICD therapy delivered for VT at 222 bpm    Patient Active Problem List    Diagnosis Date Noted   Ventricular fibrillation ([place]) 07/23/2020   Syncope and collapse 09/24/2017   GERD (gastroesophageal reflux disease) 09/24/2017   Type 2 diabetes mellitus ([place]) 09/24/2017   Chronic combined systolic and diastolic CHF (congestive heart failure) ([place]) 09/24/2017   Acute renal failure superimposed on stage 3 chronic kidney disease ([place]) 09/24/2017   Syncope    Acute kidney injury ([place]) 05/01/2017   Confusion 05/01/2017   NSTEMI (non-ST elevated myocardial infarction) ([place])    Unstable angina ([place])    Chest pain with high risk of acute coronary syndrome 08/01/2015   CKD (chronic kidney disease) stage 3, GFR 30-59 ml/min (HCC) 06/14/2015   Angina pectoris ([place]) 02/17/2015   Paroxysmal atrial fibrillation ([place]) 01/11/2015   Hypersomnia [id number]   Chronic systolic CHF (congestive heart failure) ([place]) 12/12/2012   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   Chronic cholecystitis with calculus 01/02/2012   Nausea vomiting and diarrhea 12/24/2011   Dizziness 12/24/2011   Preop cardiovascular exam 12/24/2011   CAD (coronary artery disease) 02/14/2011   HTN (hypertension) 02/14/2011   DM (diabetes mellitus) ([place]) 02/14/2011   Ischemic cardiomyopathy 10/14/2010   Hyperlipemia 10/14/2010    Past Surgical History:  Procedure Laterality  Date   BI-VENTRICULAR IMPLANTABLE CARDIOVERTER DEFIBRILLATOR UPGRADE N/A 08/24/2011   Procedure: BI-VENTRICULAR IMPLANTABLE CARDIOVERTER DEFIBRILLATOR UPGRADE;  Surgeon: Evans Lance, MD;  Location: [facility] CATH LAB;  Service: Cardiovascular;  Laterality: N/A;   BIV ICD GENERATOR CHANGEOUT N/A 02/13/2018   Procedure: BIV ICD GENERATOR CHANGEOUT;  Surgeon: Thompson Grayer, MD;  Location: [place] CV LAB;  Service: Cardiovascular;  Laterality: N/A;   CARDIAC CATHETERIZATION  08/03/2015   Procedure: Left Heart Cath and Cors/Grafts Angiography;  Surgeon: Peter M Martinique, MD;  Location: [place] CV LAB;  Service:  Cardiovascular;;   CARDIAC CATHETERIZATION N/A 08/06/2015   Procedure: Coronary Stent Intervention w/Impella;  Surgeon: Jettie Booze, MD;  Location: [place] CV LAB;  Service: Cardiovascular;  Laterality: N/A;   CARDIAC CATHETERIZATION  08/06/2015   Procedure: Left Heart Cath;  Surgeon: Jettie Booze, MD;  Location: [place] CV LAB;  Service: Cardiovascular;;   CARDIAC CATHETERIZATION  08/06/2015   Procedure: Coronary Balloon Angioplasty;  Surgeon: Jettie Booze, MD;  Location: [place] CV LAB;  Service: Cardiovascular;;   CARDIAC DEFIBRILLATOR PLACEMENT  08/2004   initial placement, upgraded to [place] ICD by Dr Lovena Le 08/24/11 (MDT)   CARDIOVERSION N/A 09/26/2019   Procedure: CARDIOVERSION;  Surgeon: Larey Dresser, MD;  Location: [facility] ENDOSCOPY;  Service: Cardiovascular;  Laterality: N/A;   CATARACT EXTRACTION W/ INTRAOCULAR LENS  IMPLANT, BILATERAL  2012   CHOLECYSTECTOMY  01/05/2012   Procedure: LAPAROSCOPIC CHOLECYSTECTOMY WITH INTRAOPERATIVE CHOLANGIOGRAM;  Surgeon: Joyice Faster. Cornett, MD;  Location: [place];  Service: General;  Laterality: N/A;  laparoscopic cholecysectoym with intraoperative cholangiogram   COLONOSCOPY WITH PROPOFOL N/A 11/18/2013   Procedure: COLONOSCOPY WITH PROPOFOL;  Surgeon: Garlan Fair, MD;  Location: [facility] ENDOSCOPY;  Service: Endoscopy;  Laterality: N/A;   CORONARY ANGIOPLASTY WITH STENT PLACEMENT  06/2007   BMS to SVG to RCA   CORONARY ARTERY BYPASS GRAFT  01/1996   CABG x 5 LIMA to LAD SVG to diag1,2,svg to om,svg to RCA   CORONARY ARTERY BYPASS GRAFT  03/06/2011   CABG X2; Procedure: REDO CORONARY ARTERY BYPASS GRAFTING (CABG);  Surgeon: Grace Isaac, MD;  Location: [place];  Service: Open Heart Surgery;  Laterality: N/A;  times two grafts using right saphenous vein harvested endoscopically.   KNEE ARTHROTOMY  ~ 1978   RIGHT KNEE CARTILAGE REMOVED   LEFT HEART CATHETERIZATION WITH CORONARY ANGIOGRAM N/A 06/06/2011   Procedure: LEFT HEART  CATHETERIZATION WITH CORONARY ANGIOGRAM;  Surgeon: Sueanne Margarita, MD;  Location: [place] CATH LAB;  Service: Cardiovascular;  Laterality: N/A;   LEFT HEART CATHETERIZATION WITH CORONARY/GRAFT ANGIOGRAM N/A 08/08/2013   Procedure: LEFT HEART CATHETERIZATION WITH Beatrix Fetters;  Surgeon: Sinclair Grooms, MD;  Location: [facility] CATH LAB;  Service: Cardiovascular;  Laterality: N/A;   LUMBAR DISC SURGERY  2003   RIGHT HEART CATHETERIZATION N/A 02/17/2014   Procedure: RIGHT HEART CATH;  Surgeon: Larey Dresser, MD;  Location: [facility] CATH LAB;  Service: Cardiovascular;  Laterality: N/A;   RIGHT/LEFT HEART CATH AND CORONARY/GRAFT ANGIOGRAPHY N/A 07/26/2020   Procedure: RIGHT/LEFT HEART CATH AND CORONARY/GRAFT ANGIOGRAPHY;  Surgeon: Larey Dresser, MD;  Location: [place] CV LAB;  Service: Cardiovascular;  Laterality: N/A;   TEE WITHOUT CARDIOVERSION N/A 09/26/2019   Procedure: TRANSESOPHAGEAL ECHOCARDIOGRAM (TEE);  Surgeon: Larey Dresser, MD;  Location: [facility] ENDOSCOPY;  Service: Cardiovascular;  Laterality: N/A;   VENOGRAM N/A 06/08/2011   Procedure: VENOGRAM;  Surgeon: Thompson Grayer, MD;  Location: [facility] CATH LAB;  Service: Cardiovascular;  Laterality: N/A;       Family History  Problem Relation Age of Onset   Heart failure Mother    Heart attack Mother    Heart failure Father    Heart attack Father    Coronary artery disease Other    Sudden Cardiac Death Brother        in his 75's   Heart attack Brother    Cancer Brother     Social History   Tobacco Use   Smoking status: Former    Packs/day: 0.50    Years: 15.00    Pack years: 7.50    Types: Cigarettes    Quit date: 04/17/1969    Years since quitting: 51.6   Smokeless tobacco: Former    Quit date: 05/05/1971  Vaping Use   Vaping Use: Never used  Substance Use Topics   Alcohol use: Yes    Alcohol/week: 1.0 standard drink    Types: 1 Cans of beer per week    Comment: occasional   Drug use: No    Home Medications Prior to  Admission medications   Medication Sig Start Date End Date Taking? Authorizing Provider  acetaminophen (TYLENOL) 500 MG tablet Take 1,000 mg by mouth every 6 (six) hours as needed for moderate pain.   Yes [provider]  allopurinol (ZYLOPRIM) 100 MG tablet Take 200 mg by mouth daily.   Yes [provider]  amiodarone (PACERONE) 200 MG tablet Take 1 tablet (200 mg total) by mouth daily. 07/26/20  Yes Larey Dresser, MD  atorvastatin (LIPITOR) 80 MG tablet TAKE 1 TABLET BY MOUTH ONCE DAILY AT  6  PM  IN  THE  EVENING Patient taking differently: Take 80 mg by mouth every evening. 11/08/20  Yes Larey Dresser, MD  ELIQUIS 5 MG TABS tablet Take 1 tablet by mouth twice daily 09/14/20  Yes Allred, Jeneen Rinks, MD  HYDROcodone-acetaminophen (NORCO/VICODIN) 5-325 MG tablet Take 1 tablet by mouth every 4 (four) hours as needed. 11/24/20  Yes Isla Pence, MD  insulin aspart protamine- aspart (NOVOLOG MIX 70/30) (70-30) 100 UNIT/ML injection Inject into the skin daily as needed. According to blood sugar   Yes [provider]  loperamide (IMODIUM A-D) 2 MG tablet Take 2 mg by mouth 4 (four) times daily as needed for diarrhea or loose stools.   Yes [provider]  nitroGLYCERIN (NITROSTAT) 0.4 MG SL tablet Place 1 tablet (0.4 mg total) under the tongue every 5 (five) minutes as needed for chest pain. DO NOT EXCEED A TOTAL OF 3 DOSES IN 15 MINUTES 11/16/20  Yes [place], [place], [place]  QUEtiapine (SEROQUEL) 100 MG tablet Take 100 mg by mouth at bedtime.   Yes [provider]  ranolazine (RANEXA) 500 MG 12 hr tablet Take 500 mg by mouth 2 (two) times daily.   Yes [provider]  triamcinolone cream (KENALOG) 0.1 % Apply 1 application topically 2 (two) times daily as needed (for dry/irritated skin.).   Yes [provider]  venlafaxine (EFFEXOR-XR) 150 MG 24 hr capsule Take 150 mg by mouth daily with breakfast.   Yes [provider]  vitamin B-12  (CYANOCOBALAMIN) 1000 MCG tablet Take 1,000 mcg by mouth daily.   Yes [provider]  Continuous Blood Gluc Sensor (FREESTYLE LIBRE 14 DAY SENSOR) MISC USE TO CHECK GLUCOSE THREE TIMES DAILY AS DIRECTED 11/06/18   [provider]  furosemide (LASIX) 40 MG tablet Take 2 tablets (80 mg total) every  morning.  Take 1 tablet in the evening only if there is fluid build up or shortness of breath. 11/24/20   Isla Pence, MD  midodrine (PROAMATINE) 10 MG tablet Take 1.5 tablets (15 mg total) by mouth 3 (three) times daily with meals. 11/24/20 12/24/20  Isla Pence, MD  potassium chloride SA (KLOR-CON) 20 MEQ tablet Take 2 tablets (40 mEq total) by mouth daily. Patient not taking: No sig reported 07/27/20   Darrick Grinder D, NP    Allergies    Codeine, Metformin hcl, Other, Trazodone, and Adhesive [tape]  Review of Systems   Review of Systems  Neurological:  Positive for dizziness and weakness.  All other systems reviewed and are negative.  Physical Exam Updated Vital Signs BP (!) 159/67   Pulse 64   Temp 98.1 F (36.7 C) (Oral)   Resp 14   Ht '5\' 9"'$  (1.753 m)   Wt 76.9 kg   SpO2 100%   BMI 25.05 kg/m   Physical Exam Vitals and nursing note reviewed.  Constitutional:      Appearance: He is well-developed.  HENT:     Head: Normocephalic and atraumatic.  Eyes:     Extraocular Movements: Extraocular movements intact.     Pupils: Pupils are equal, round, and reactive to light.  Cardiovascular:     Rate and Rhythm: Normal rate and regular rhythm.     Heart sounds: Normal heart sounds.  Pulmonary:     Effort: Pulmonary effort is normal.     Breath sounds: Normal breath sounds.  Chest:    Abdominal:     General: Bowel sounds are normal.     Palpations: Abdomen is soft.  Musculoskeletal:        General: Normal range of motion.     Cervical back: Normal range of motion and neck supple.  Skin:    General: Skin is warm.     Capillary Refill: Capillary refill takes  less than 2 seconds.  Neurological:     General: No focal deficit present.     Mental Status: He is alert and oriented to person, place, and time.  Psychiatric:        Mood and Affect: Mood normal.        Behavior: Behavior normal.    ED Results / Procedures / Treatments   Labs (all labs ordered are listed, but only abnormal results are displayed) Labs Reviewed  BASIC METABOLIC PANEL - Abnormal; Notable for the following components:      Result Value   Glucose, Bld 170 (*)    BUN 29 (*)    Creatinine, Ser 2.02 (*)    GFR, Estimated 33 (*)    All other components within normal limits  CBC - Abnormal; Notable for the following components:   RBC 4.12 (*)    MCV 103.2 (*)    All other components within normal limits  TROPONIN I (HIGH SENSITIVITY) - Abnormal; Notable for the following components:   Troponin I (High Sensitivity) 20 (*)    All other components within normal limits  TROPONIN I (HIGH SENSITIVITY) - Abnormal; Notable for the following components:   Troponin I (High Sensitivity) 18 (*)    All other components within normal limits  URINALYSIS, ROUTINE W REFLEX MICROSCOPIC    EKG EKG Interpretation  Date/Time:  Wednesday November 24 2020 17:03:17 EDT Ventricular Rate:  61 PR Interval:  152 QRS Duration: 136 QT Interval:  494 QTC Calculation: 497 R Axis:   245 Text Interpretation: Atrial-sensed  ventricular-paced rhythm Abnormal ECG No significant change since last tracing Confirmed by Isla Pence [phone]) on 11/24/2020 7:26:18 PM  Radiology DG Chest 2 View  Result Date: 11/24/2020 CLINICAL DATA:  Chest pain. EXAM: CHEST - 2 VIEW COMPARISON:  July 24, 2020. FINDINGS: Stable cardiomediastinal silhouette. Left-sided pacemaker is unchanged in position. Status post coronary bypass graft. No pneumothorax or pleural effusion is noted. Lungs are clear. Bony thorax is unremarkable. IMPRESSION: No active cardiopulmonary disease. Aortic Atherosclerosis (ICD10-I70.0).  Electronically Signed   By: Marijo Conception M.D.   On: 11/24/2020 17:31   CT HEAD WO CONTRAST (5MM)  Result Date: 11/24/2020 CLINICAL DATA:  Mental status change EXAM: CT HEAD WITHOUT CONTRAST TECHNIQUE: Contiguous axial images were obtained from the base of the skull through the vertex without intravenous contrast. COMPARISON:  CT brain 10/29/2019 FINDINGS: Brain: No acute territorial infarction, hemorrhage or intracranial mass. Moderate atrophy. Mild chronic small vessel ischemic changes of the white matter. Stable ventricle size. Vascular: No hyperdense vessel.  Carotid vascular calcification Skull: Normal. Negative for fracture or focal lesion. Sinuses/Orbits: No acute finding. Other: None IMPRESSION: 1. No CT evidence for acute intracranial abnormality. Atrophy and mild chronic small vessel ischemic changes of the white matter. Electronically Signed   By: Donavan Foil M.D.   On: 11/24/2020 20:17   CT CHEST ABDOMEN PELVIS WO CONTRAST  Result Date: 11/24/2020 CLINICAL DATA:  Increasing pain LEFT side. EXAM: CT CHEST, ABDOMEN AND PELVIS WITHOUT CONTRAST TECHNIQUE: Multidetector CT imaging of the chest, abdomen and pelvis was performed following the standard protocol without IV contrast. COMPARISON:  None. FINDINGS: CT CHEST FINDINGS Cardiovascular: Post CABG.  Pacer leads noted LEFT RIGHT heart. Mediastinum/Nodes: No axillary or supraclavicular adenopathy. No mediastinal or hilar adenopathy. No pericardial fluid. Esophagus normal. Lungs/Pleura: No pneumothorax. No pulmonary contusion or pleural fluid. Musculoskeletal: Nondisplaced rib fracture of the anterior LEFT seventh rib (image 97/3) no scapular fracture or clavicle fracture. CT ABDOMEN AND PELVIS FINDINGS Hepatobiliary: No hepatic laceration on noncontrast exam. Postcholecystectomy. Pancreas: Pancreas is normal. No ductal dilatation. No pancreatic inflammation. Spleen: No splenic injury identified on noncontrast exam. Adrenals/urinary tract: Adrenal  glands and kidneys are normal. Normal ureters. Thickening of the anterior bladder wall nonspecific. Stomach/Bowel: Stomach, small bowel, appendix, and cecum are normal. The colon and rectosigmoid colon are normal. Vascular/Lymphatic: Abdominal aorta is normal caliber with atherosclerotic calcification. There is no retroperitoneal or periportal lymphadenopathy. No pelvic lymphadenopathy. Reproductive: Unremarkable Other: No free fluid. Musculoskeletal: No pelvic fracture spine fracture. IMPRESSION: 1. Nondisplaced LEFT anterior seventh rib fracture. 2. No pneumothorax. 3. No pulmonary contusion or pleural fluid. 4. No evidence of injury in the abdomen pelvis. Electronically Signed   By: Suzy Bouchard M.D.   On: 11/24/2020 21:38    Procedures Procedures   Medications Ordered in ED Medications  sodium chloride 0.9 % bolus 500 mL (0 mLs Intravenous Stopped 11/24/20 2041)  sodium chloride 0.9 % bolus 500 mL (0 mLs Intravenous Stopped 11/24/20 2111)  midodrine (PROAMATINE) tablet 15 mg (15 mg Oral Given 11/24/20 2201)  sodium chloride 0.9 % bolus 500 mL (500 mLs Intravenous New Bag/Given 11/24/20 2159)    ED Course  I have reviewed the triage vital signs and the nursing notes.  Pertinent labs & imaging results that were available during my care of the patient were reviewed by me and considered in my medical decision making (see chart for details).    MDM Rules/Calculators/A&P  I am going to increase the midodrine.  I am going to have pt only take the lasix at night if he is sob or noticing swelling.  I am going to hold metoprolol.  Pacemaker interrogated and was fine.    For the rib fx, pt is given an incentive spirometer and a rx for lortab.  Final Clinical Impression(s) / ED Diagnoses Final diagnoses:  Trauma  Orthostatic hypotension  AKI (acute kidney injury) ([place])  Dehydration  Closed fracture of one rib of left side, initial encounter    Rx / DC Orders ED  Discharge Orders          Ordered    furosemide (LASIX) 40 MG tablet       Note to Pharmacy: Patient has supply on hand and will call when refill needed.  Please cancel all previous orders for current medication. Change in dosage or pill size.   11/24/20 2150    midodrine (PROAMATINE) 10 MG tablet  3 times daily with meals       Note to Pharmacy: Please cancel all previous orders for current medication. Change in dosage or pill size.   11/24/20 2150    HYDROcodone-acetaminophen (NORCO/VICODIN) 5-325 MG tablet  Every 4 hours PRN        11/24/20 2157             Isla Pence, MD 11/24/20 2215

## 2020-11-24 NOTE — ED Notes (Addendum)
Orthostatic VS obtained after 500 mL bolus  Lying PR 62 BR 157/67 Sitting PR 65 BP 136/73 Standing 0 min PR 68 BP 124/52 Standing 3 min PR 67 BP 127/56  Pt states feeling dizzy and shaky while changing positions. Required assistance from staff in maintaining stability while standing. Dr. Carolynne Edouard informed vis secure chat.

## 2020-11-24 NOTE — ED Triage Notes (Signed)
Pt to the ED [facility] after a fall with chest pain and tremors.  Pt states he keeps falling due to dizziness.

## 2020-11-24 NOTE — ED Notes (Signed)
Pacemaker interrogated pending results.

## 2020-11-26 NOTE — Progress Notes (Deleted)
Electrophysiology Office Note Date: 11/26/2020  ID:  [name], [name] March 30, 1941, MRN [id number]  PCP: Mayra Neer, MD Primary Cardiologist: None Electrophysiologist: Thompson Grayer, MD   CC: Routine ICD follow-up  [name] is a 80 y.o. male seen today for Thompson Grayer, MD for routine electrophysiology followup.    Seen in ED 8/10 after fall and found to be othostatic. Midodrine increased. Rib fx noted and given pain meds and incentive spirometer.  Since last being seen in our clinic the patient reports doing ***.  he denies chest pain, palpitations, dyspnea, PND, orthopnea, nausea, vomiting, dizziness, syncope, edema, weight gain, or early satiety. {He/she (caps):30048} has not had ICD shocks.   Device History: Medtronic BiV ICD implanted as dual chamber 2011, BiV upgrade 2013, gen change 01/2018 for ICM  Past Medical History:  Diagnosis Date   CAD (coronary artery disease)    a. s/p CABG 1997 b. PCI (BMS) of SVG to RCA 3/09 c. redo CABG 02/2011 with SVG to PDA, SVG to Lcx d. Cath 07/2013: ischemic cardiomyopathy with LVEF less than 20%, occlusion of the SVG's placed in 2012 e.stenting of LM in 07/2015 with PCI of LCx performed as well   CHF (congestive heart failure) (HCC)    Chronic systolic dysfunction of left ventricle    EF 30%   Depression    DJD (degenerative joint disease)    DM (diabetes mellitus) ([place]) 02/14/2011   GERD (gastroesophageal reflux disease)    Gout    HTN (hypertension) 02/14/2011   Hyperlipemia    Ischemic cardiomyopathy    s/p ICD Implantation by Dr Leonia Reeves   Paroxysmal atrial fibrillation ([place]) 10/22/2014   single episode of AF x 3 hours 48 minutes recorded on ICD, chads2vasc score is at least 5    Renal disorder    Ventricular tachycardia ([place]) 07/23/2020   appropriate ICD therapy delivered for VT at 222 bpm   Past Surgical History:  Procedure Laterality Date   BI-VENTRICULAR IMPLANTABLE CARDIOVERTER DEFIBRILLATOR UPGRADE N/A  08/24/2011   Procedure: BI-VENTRICULAR IMPLANTABLE CARDIOVERTER DEFIBRILLATOR UPGRADE;  Surgeon: Evans Lance, MD;  Location: [facility] CATH LAB;  Service: Cardiovascular;  Laterality: N/A;   BIV ICD GENERATOR CHANGEOUT N/A 02/13/2018   Procedure: BIV ICD GENERATOR CHANGEOUT;  Surgeon: Thompson Grayer, MD;  Location: [place] CV LAB;  Service: Cardiovascular;  Laterality: N/A;   CARDIAC CATHETERIZATION  08/03/2015   Procedure: Left Heart Cath and Cors/Grafts Angiography;  Surgeon: Peter M Martinique, MD;  Location: [place] CV LAB;  Service: Cardiovascular;;   CARDIAC CATHETERIZATION N/A 08/06/2015   Procedure: Coronary Stent Intervention w/Impella;  Surgeon: Jettie Booze, MD;  Location: [place] CV LAB;  Service: Cardiovascular;  Laterality: N/A;   CARDIAC CATHETERIZATION  08/06/2015   Procedure: Left Heart Cath;  Surgeon: Jettie Booze, MD;  Location: [place] CV LAB;  Service: Cardiovascular;;   CARDIAC CATHETERIZATION  08/06/2015   Procedure: Coronary Balloon Angioplasty;  Surgeon: Jettie Booze, MD;  Location: [place] CV LAB;  Service: Cardiovascular;;   CARDIAC DEFIBRILLATOR PLACEMENT  08/2004   initial placement, upgraded to [place] ICD by Dr Lovena Le 08/24/11 (MDT)   CARDIOVERSION N/A 09/26/2019   Procedure: CARDIOVERSION;  Surgeon: Larey Dresser, MD;  Location: [facility] ENDOSCOPY;  Service: Cardiovascular;  Laterality: N/A;   CATARACT EXTRACTION W/ INTRAOCULAR LENS  IMPLANT, BILATERAL  2012   CHOLECYSTECTOMY  01/05/2012   Procedure: LAPAROSCOPIC CHOLECYSTECTOMY WITH INTRAOPERATIVE CHOLANGIOGRAM;  Surgeon: Joyice Faster. Cornett, MD;  Location: [place];  Service: General;  Laterality: N/A;  laparoscopic cholecysectoym with intraoperative cholangiogram   COLONOSCOPY WITH PROPOFOL N/A 11/18/2013   Procedure: COLONOSCOPY WITH PROPOFOL;  Surgeon: Garlan Fair, MD;  Location: [facility] ENDOSCOPY;  Service: Endoscopy;  Laterality: N/A;   CORONARY ANGIOPLASTY WITH STENT PLACEMENT  06/2007   BMS to SVG  to RCA   CORONARY ARTERY BYPASS GRAFT  01/1996   CABG x 5 LIMA to LAD SVG to diag1,2,svg to om,svg to RCA   CORONARY ARTERY BYPASS GRAFT  03/06/2011   CABG X2; Procedure: REDO CORONARY ARTERY BYPASS GRAFTING (CABG);  Surgeon: Grace Isaac, MD;  Location: [place];  Service: Open Heart Surgery;  Laterality: N/A;  times two grafts using right saphenous vein harvested endoscopically.   KNEE ARTHROTOMY  ~ 1978   RIGHT KNEE CARTILAGE REMOVED   LEFT HEART CATHETERIZATION WITH CORONARY ANGIOGRAM N/A 06/06/2011   Procedure: LEFT HEART CATHETERIZATION WITH CORONARY ANGIOGRAM;  Surgeon: Sueanne Margarita, MD;  Location: [place] CATH LAB;  Service: Cardiovascular;  Laterality: N/A;   LEFT HEART CATHETERIZATION WITH CORONARY/GRAFT ANGIOGRAM N/A 08/08/2013   Procedure: LEFT HEART CATHETERIZATION WITH Beatrix Fetters;  Surgeon: Sinclair Grooms, MD;  Location: [facility] CATH LAB;  Service: Cardiovascular;  Laterality: N/A;   LUMBAR DISC SURGERY  2003   RIGHT HEART CATHETERIZATION N/A 02/17/2014   Procedure: RIGHT HEART CATH;  Surgeon: Larey Dresser, MD;  Location: [facility] CATH LAB;  Service: Cardiovascular;  Laterality: N/A;   RIGHT/LEFT HEART CATH AND CORONARY/GRAFT ANGIOGRAPHY N/A 07/26/2020   Procedure: RIGHT/LEFT HEART CATH AND CORONARY/GRAFT ANGIOGRAPHY;  Surgeon: Larey Dresser, MD;  Location: [place] CV LAB;  Service: Cardiovascular;  Laterality: N/A;   TEE WITHOUT CARDIOVERSION N/A 09/26/2019   Procedure: TRANSESOPHAGEAL ECHOCARDIOGRAM (TEE);  Surgeon: Larey Dresser, MD;  Location: [facility] ENDOSCOPY;  Service: Cardiovascular;  Laterality: N/A;   VENOGRAM N/A 06/08/2011   Procedure: VENOGRAM;  Surgeon: Thompson Grayer, MD;  Location: [facility] CATH LAB;  Service: Cardiovascular;  Laterality: N/A;    Current Outpatient Medications  Medication Sig Dispense Refill   acetaminophen (TYLENOL) 500 MG tablet Take 1,000 mg by mouth every 6 (six) hours as needed for moderate pain.     allopurinol (ZYLOPRIM) 100 MG tablet Take  200 mg by mouth daily.     amiodarone (PACERONE) 200 MG tablet Take 1 tablet (200 mg total) by mouth daily. 30 tablet 6   atorvastatin (LIPITOR) 80 MG tablet TAKE 1 TABLET BY MOUTH ONCE DAILY AT  6  PM  IN  THE  EVENING (Patient taking differently: Take 80 mg by mouth every evening.) 90 tablet 0   Continuous Blood Gluc Sensor (FREESTYLE LIBRE 14 DAY SENSOR) MISC USE TO CHECK GLUCOSE THREE TIMES DAILY AS DIRECTED     ELIQUIS 5 MG TABS tablet Take 1 tablet by mouth twice daily 60 tablet 6   furosemide (LASIX) 40 MG tablet Take 2 tablets (80 mg total) every morning.  Take 1 tablet in the evening only if there is fluid build up or shortness of breath. 225 tablet 3   HYDROcodone-acetaminophen (NORCO/VICODIN) 5-325 MG tablet Take 1 tablet by mouth every 4 (four) hours as needed. 10 tablet 0   insulin aspart protamine- aspart (NOVOLOG MIX 70/30) (70-30) 100 UNIT/ML injection Inject into the skin daily as needed. According to blood sugar     loperamide (IMODIUM A-D) 2 MG tablet Take 2 mg by mouth 4 (four) times daily as needed for diarrhea or loose stools.     midodrine (  PROAMATINE) 10 MG tablet Take 1.5 tablets (15 mg total) by mouth 3 (three) times daily with meals. 135 tablet 0   nitroGLYCERIN (NITROSTAT) 0.4 MG SL tablet Place 1 tablet (0.4 mg total) under the tongue every 5 (five) minutes as needed for chest pain. DO NOT EXCEED A TOTAL OF 3 DOSES IN 15 MINUTES 25 tablet 6   potassium chloride SA (KLOR-CON) 20 MEQ tablet Take 2 tablets (40 mEq total) by mouth daily. (Patient not taking: No sig reported) 60 tablet 6   QUEtiapine (SEROQUEL) 100 MG tablet Take 100 mg by mouth at bedtime.     ranolazine (RANEXA) 500 MG 12 hr tablet Take 500 mg by mouth 2 (two) times daily.     triamcinolone cream (KENALOG) 0.1 % Apply 1 application topically 2 (two) times daily as needed (for dry/irritated skin.).     venlafaxine (EFFEXOR-XR) 150 MG 24 hr capsule Take 150 mg by mouth daily with breakfast.     vitamin B-12  (CYANOCOBALAMIN) 1000 MCG tablet Take 1,000 mcg by mouth daily.     No current facility-administered medications for this visit.    Allergies:   Codeine, Metformin hcl, Other, Trazodone, and Adhesive [tape]   Social History: Social History   Socioeconomic History   Marital status: Married    Spouse name: Not on file   Number of children: Not on file   Years of education: Not on file   Highest education level: Not on file  Occupational History   Not on file  Tobacco Use   Smoking status: Former    Packs/day: 0.50    Years: 15.00    Pack years: 7.50    Types: Cigarettes    Quit date: 04/17/1969    Years since quitting: 51.6   Smokeless tobacco: Former    Quit date: 05/05/1971  Vaping Use   Vaping Use: Never used  Substance and Sexual Activity   Alcohol use: Yes    Alcohol/week: 1.0 standard drink    Types: 1 Cans of beer per week    Comment: occasional   Drug use: No   Sexual activity: Never  Other Topics Concern   Not on file  Social History Narrative   Lives in [place], retired Theatre manager for [employer].  He collects antique metal toys   Social Determinants of Health   Financial Resource Strain: Not on file  Food Insecurity: Not on file  Transportation Needs: Not on file  Physical Activity: Not on file  Stress: Not on file  Social Connections: Not on file  Intimate Partner Violence: Not on file    Family History: Family History  Problem Relation Age of Onset   Heart failure Mother    Heart attack Mother    Heart failure Father    Heart attack Father    Coronary artery disease Other    Sudden Cardiac Death Brother        in his 63's   Heart attack Brother    Cancer Brother     Review of Systems: All other systems reviewed and are otherwise negative except as noted above.   Physical Exam: There were no vitals filed for this visit.   GEN- The patient is well appearing, alert and oriented x 3 today.   HEENT: normocephalic,  atraumatic; sclera clear, conjunctiva pink; hearing intact; oropharynx clear; neck supple, no JVP Lymph- no cervical lymphadenopathy Lungs- Clear to ausculation bilaterally, normal work of breathing.  No wheezes, rales, rhonchi Heart- Regular rate and rhythm,  no murmurs, rubs or gallops, PMI not laterally displaced GI- soft, non-tender, non-distended, bowel sounds present, no hepatosplenomegaly Extremities- no clubbing or cyanosis. No edema; DP/PT/radial pulses 2+ bilaterally MS- no significant deformity or atrophy Skin- warm and dry, no rash or lesion; ICD pocket well healed Psych- euthymic mood, full affect Neuro- strength and sensation are intact  ICD interrogation- reviewed in detail today,  See PACEART report  EKG:  EKG is not ordered today. Personal review of EKG ordered  11/24/20  shows  Recent Labs: 07/23/2020: Magnesium 2.1 07/24/2020: B Natriuretic Peptide 163.7 09/29/2020: ALT 19; TSH 1.883 11/24/2020: BUN 29; Creatinine, Ser 2.02; Hemoglobin 13.7; Platelets 285; Potassium 3.9; Sodium 136   Wt Readings from Last 3 Encounters:  11/24/20 169 lb 9.6 oz (76.9 kg)  11/16/20 169 lb 9.6 oz (76.9 kg)  09/29/20 182 lb (82.6 kg)     Other studies Reviewed: Additional studies/ records that were reviewed today include: [facility] EP and HF notes   Assessment and Plan:  1.  VT s/p MDT ICD *** on device check  Continue amiodarone 200 mg daily.  had VT and was admitted 4/22 Started on amiodarone.  Labs stable 09/29/20 Normal ICD function.  See [facility] Art report   2. Chronic systolic dysfunction/ CAD/ ischemic CM NYHA *** symptoms Volume status *** Atrial threshold is chronically increased (2V '@1'$  msec). He A paces 50%.  I have reduced lower pacing rate today to 50 bpm followed in ICM device clinic   3. Persistent afib Burden <***% Continue eliquis   4. HTN Stable on current regimen.   5. Stage IIIb renal failure Stable    Current medicines are reviewed at length with the  patient today.   The patient {ACTIONS; HAS/DOES NOT HAVE:19233} concerns regarding his medicines.  The following changes were made today:  {NONE DEFAULTED:18576}  Labs/ tests ordered today include: *** No orders of the defined types were placed in this encounter.    Disposition:   Follow up with {Blank single:19197::"Dr. Allred","Dr. Arlan Organ. Klein","Dr. Camnitz","Dr. Lambert","EP APP"}  {gen number [id number] {TIME; UNITS DAY/WEEK/MONTH:19136}   Signed, Shirley Friar, PA-C  11/26/2020 3:25 PM  [place] [address] [phone] (office) [phone] (fax)

## 2020-11-29 ENCOUNTER — Encounter: Payer: Medicare HMO | Admitting: Student

## 2020-11-29 ENCOUNTER — Ambulatory Visit (INDEPENDENT_AMBULATORY_CARE_PROVIDER_SITE_OTHER): Payer: Medicare HMO

## 2020-11-29 ENCOUNTER — Telehealth (HOSPITAL_COMMUNITY): Payer: Self-pay | Admitting: *Deleted

## 2020-11-29 DIAGNOSIS — I480 Paroxysmal atrial fibrillation: Secondary | ICD-10-CM

## 2020-11-29 DIAGNOSIS — I472 Ventricular tachycardia: Secondary | ICD-10-CM

## 2020-11-29 DIAGNOSIS — N1831 Chronic kidney disease, stage 3a: Secondary | ICD-10-CM

## 2020-11-29 DIAGNOSIS — Z95810 Presence of automatic (implantable) cardiac defibrillator: Secondary | ICD-10-CM

## 2020-11-29 DIAGNOSIS — I2510 Atherosclerotic heart disease of native coronary artery without angina pectoris: Secondary | ICD-10-CM

## 2020-11-29 DIAGNOSIS — I5042 Chronic combined systolic (congestive) and diastolic (congestive) heart failure: Secondary | ICD-10-CM

## 2020-11-29 NOTE — Telephone Encounter (Signed)
Larey Dresser, MD  Scarlette Calico, RN; [place], [place], CMA; Harvie Junior, CMA Make sure that he increased midodrine back to 15 mg tid.   Would keep Lasix at 80 mg daily.       Spoke w/pt's wife, she states pt is on the Midodrine 15 mg TID and is only taking lasix 80 mg in AM. Advised to continue these doses and to let us know if patient is retaining fluid, she is agreeable.

## 2020-11-29 NOTE — Progress Notes (Signed)
EPIC Encounter for ICM Monitoring  Patient Name: [name] is a 80 y.o. male Date: 11/29/2020 Primary Care Physican: Mayra Neer, MD Primary Cardiologist: Aundra Dubin Electrophysiologist: Allred Bi-V Pacing:  99.4%    11/29/2020 Weight: 179 lbs (baseline 174 lbs)   Time in AT/AF  0.0 hr/day (0.0%)         Spoke with wife and she reports pt's weight is up 3-4 lbs.  He had ER visit 8/10  due to fall/weakness.  The hospital physician gave him fluids in ER and decreased Furosemide to morning dose and PRN evening dose.   Wife currently has COVID.       OptiVol Thoracic impedance suggesting possible fluid accumulation starting 11/09/2020 and trending back to baseline.   Prescribed:  Furosemide 40 mg Take 2 tablets (80 mg total) every morning.  Take 1 tablet in the evening only if there is fluid build up or shortness of breath. (Decreased on 8/10 during ER visit) Potassium 20 mEq take 2 tablets by mouth daily (Potassium discontinued on 11/24/2020)   Labs: 11/24/2020 Creatinine 2.02, BUN 29, Potassium 3.9, Sodium 136, GFR 33 11/16/2020 Creatinine 1.66, BUN 25, Potassium 4.2, Sodium 136, GFR 42  10/11/2020 Creatinine 1.75, BUN 30, Potassium 4.2, Sodium 138, GFR 39 09/29/2020 Creatinine 1.40, BUN 23, Potassium 4.5, sodium 137, GFR 51 08/24/2020 Creatinine 1.74, BUN 29, Potassium 3.8, Sodium 138, GFR 40 A complete set of results can be found in Results Review.   Recommendations:  Advised to take 1 extra 40 mg Furosemide this evening.     Follow-up plan: ICM clinic phone appointment on 12/02/2020 to recheck fluid levels.   91 day device clinic remote transmission 02/10/2021.     EP/Cardiology Office Visits:  01/19/2021 with Dr Aundra Dubin.  11/29/2020 with [name], PA was canceled by patient.   Copy of ICM check sent to Dr. Rayann Heman.  3 month ICM trend: 11/29/2020.    1 Year ICM trend:       [name], RN 11/29/2020 10:38 AM

## 2020-11-30 ENCOUNTER — Ambulatory Visit: Payer: Medicare HMO | Admitting: Physical Therapy

## 2020-11-30 ENCOUNTER — Other Ambulatory Visit: Payer: Self-pay

## 2020-12-01 ENCOUNTER — Ambulatory Visit (INDEPENDENT_AMBULATORY_CARE_PROVIDER_SITE_OTHER): Payer: Medicare HMO

## 2020-12-01 DIAGNOSIS — Z95810 Presence of automatic (implantable) cardiac defibrillator: Secondary | ICD-10-CM

## 2020-12-01 DIAGNOSIS — I5042 Chronic combined systolic (congestive) and diastolic (congestive) heart failure: Secondary | ICD-10-CM

## 2020-12-03 NOTE — Progress Notes (Signed)
EPIC Encounter for ICM Monitoring  Patient Name: [name] is a 80 y.o. male Date: 12/03/2020 Primary Care Physican: Mayra Neer, MD Primary Cardiologist: Aundra Dubin Electrophysiologist: Allred Bi-V Pacing:  99.4%    11/29/2020 Weight: 179 lbs (baseline 174 lbs)   Time in AT/AF  0.0 hr/day (0.0%)         Spoke with wife and she reports pt continues to be dizzy and weak. Unable to weigh at this time due to weakness. He had ER visit 8/10  due to fall/weakness.  Wife has [place] and daughter is in quarantine with them.       OptiVol Thoracic impedance suggesting possible fluid accumulation starting 11/09/2020 but is almost back at baseline normal.   Prescribed:  Furosemide 40 mg Take 2 tablets (80 mg total) every morning.  Take 1 tablet in the evening only if there is fluid build up or shortness of breath. (Decreased on 8/10 during ER visit) Potassium 20 mEq take 2 tablets by mouth daily (Potassium discontinued on 11/24/2020)   Labs: 11/24/2020 Creatinine 2.02, BUN 29, Potassium 3.9, Sodium 136, GFR 33 11/16/2020 Creatinine 1.66, BUN 25, Potassium 4.2, Sodium 136, GFR 42  10/11/2020 Creatinine 1.75, BUN 30, Potassium 4.2, Sodium 138, GFR 39 09/29/2020 Creatinine 1.40, BUN 23, Potassium 4.5, sodium 137, GFR 51 08/24/2020 Creatinine 1.74, BUN 29, Potassium 3.8, Sodium 138, GFR 40 A complete set of results can be found in Results Review.   Recommendations: No changes and encouraged to call if experiencing any fluid symptoms.   Follow-up plan: ICM clinic phone appointment on 12/13/2020 to recheck fluid levels.   91 day device clinic remote transmission 02/10/2021.     EP/Cardiology Office Visits:  01/19/2021 with Dr Aundra Dubin.  11/29/2020 with [name], PA was canceled by patient.   Copy of ICM check sent to Dr. Rayann Heman.   3 month ICM trend: 12/02/2020.    1 Year ICM trend:       [name], RN 12/03/2020 12:05 PM

## 2020-12-04 ENCOUNTER — Other Ambulatory Visit: Payer: Self-pay

## 2020-12-04 ENCOUNTER — Encounter (HOSPITAL_COMMUNITY): Payer: Self-pay

## 2020-12-04 ENCOUNTER — Emergency Department (HOSPITAL_COMMUNITY): Payer: Medicare HMO

## 2020-12-04 ENCOUNTER — Emergency Department (HOSPITAL_COMMUNITY)
Admission: EM | Admit: 2020-12-04 | Discharge: 2020-12-04 | Disposition: A | Discharge status: Home / Self Care | Payer: Medicare HMO | Attending: Student | Admitting: Student

## 2020-12-04 DIAGNOSIS — I959 Hypotension, unspecified: Secondary | ICD-10-CM | POA: Diagnosis not present

## 2020-12-04 DIAGNOSIS — R531 Weakness: Secondary | ICD-10-CM

## 2020-12-04 DIAGNOSIS — R42 Dizziness and giddiness: Secondary | ICD-10-CM | POA: Diagnosis not present

## 2020-12-04 DIAGNOSIS — N183 Chronic kidney disease, stage 3 unspecified: Secondary | ICD-10-CM | POA: Insufficient documentation

## 2020-12-04 DIAGNOSIS — I130 Hypertensive heart and chronic kidney disease with heart failure and stage 1 through stage 4 chronic kidney disease, or unspecified chronic kidney disease: Secondary | ICD-10-CM | POA: Insufficient documentation

## 2020-12-04 DIAGNOSIS — U071 COVID-19: Secondary | ICD-10-CM | POA: Diagnosis not present

## 2020-12-04 DIAGNOSIS — I5042 Chronic combined systolic (congestive) and diastolic (congestive) heart failure: Secondary | ICD-10-CM | POA: Insufficient documentation

## 2020-12-04 DIAGNOSIS — Z7901 Long term (current) use of anticoagulants: Secondary | ICD-10-CM | POA: Diagnosis not present

## 2020-12-04 DIAGNOSIS — I2510 Atherosclerotic heart disease of native coronary artery without angina pectoris: Secondary | ICD-10-CM | POA: Insufficient documentation

## 2020-12-04 DIAGNOSIS — Z743 Need for continuous supervision: Secondary | ICD-10-CM | POA: Diagnosis not present

## 2020-12-04 DIAGNOSIS — Z951 Presence of aortocoronary bypass graft: Secondary | ICD-10-CM | POA: Insufficient documentation

## 2020-12-04 DIAGNOSIS — Z794 Long term (current) use of insulin: Secondary | ICD-10-CM | POA: Diagnosis not present

## 2020-12-04 DIAGNOSIS — R0902 Hypoxemia: Secondary | ICD-10-CM | POA: Diagnosis not present

## 2020-12-04 DIAGNOSIS — Z79899 Other long term (current) drug therapy: Secondary | ICD-10-CM | POA: Diagnosis not present

## 2020-12-04 DIAGNOSIS — R519 Headache, unspecified: Secondary | ICD-10-CM | POA: Insufficient documentation

## 2020-12-04 DIAGNOSIS — E1122 Type 2 diabetes mellitus with diabetic chronic kidney disease: Secondary | ICD-10-CM | POA: Insufficient documentation

## 2020-12-04 DIAGNOSIS — Z87891 Personal history of nicotine dependence: Secondary | ICD-10-CM | POA: Diagnosis not present

## 2020-12-04 LAB — CBC WITH DIFFERENTIAL/PLATELET
Abs Immature Granulocytes: 0.06 10*3/uL (ref 0.00–0.07)
Basophils Absolute: 0 10*3/uL (ref 0.0–0.1)
Basophils Relative: 0 %
Eosinophils Absolute: 0.1 10*3/uL (ref 0.0–0.5)
Eosinophils Relative: 1 %
HCT: 37.5 % — ABNORMAL LOW (ref 39.0–52.0)
Hemoglobin: 12 g/dL — ABNORMAL LOW (ref 13.0–17.0)
Immature Granulocytes: 1 %
Lymphocytes Relative: 14 %
Lymphs Abs: 1.1 10*3/uL (ref 0.7–4.0)
MCH: 32.9 pg (ref 26.0–34.0)
MCHC: 32 g/dL (ref 30.0–36.0)
MCV: 102.7 fL — ABNORMAL HIGH (ref 80.0–100.0)
Monocytes Absolute: 1.2 10*3/uL — ABNORMAL HIGH (ref 0.1–1.0)
Monocytes Relative: 15 %
Neutro Abs: 5.8 10*3/uL (ref 1.7–7.7)
Neutrophils Relative %: 69 %
Platelets: 238 10*3/uL (ref 150–400)
RBC: 3.65 MIL/uL — ABNORMAL LOW (ref 4.22–5.81)
RDW: 14.7 % (ref 11.5–15.5)
WBC: 8.2 10*3/uL (ref 4.0–10.5)
nRBC: 0 % (ref 0.0–0.2)

## 2020-12-04 LAB — BASIC METABOLIC PANEL
Anion gap: 8 (ref 5–15)
BUN: 28 mg/dL — ABNORMAL HIGH (ref 8–23)
CO2: 28 mmol/L (ref 22–32)
Calcium: 8.3 mg/dL — ABNORMAL LOW (ref 8.9–10.3)
Chloride: 97 mmol/L — ABNORMAL LOW (ref 98–111)
Creatinine, Ser: 1.75 mg/dL — ABNORMAL HIGH (ref 0.61–1.24)
GFR, Estimated: 39 mL/min — ABNORMAL LOW (ref >60)
Glucose, Bld: 115 mg/dL — ABNORMAL HIGH (ref 70–99)
Potassium: 3.8 mmol/L (ref 3.5–5.1)
Sodium: 133 mmol/L — ABNORMAL LOW (ref 135–145)

## 2020-12-04 LAB — RESP PANEL BY RT-PCR (FLU A&B, COVID) ARPGX2
Influenza A by PCR: NEGATIVE
Influenza B by PCR: NEGATIVE
SARS Coronavirus 2 by RT PCR: POSITIVE — AB

## 2020-12-04 LAB — BRAIN NATRIURETIC PEPTIDE: B Natriuretic Peptide: 456 pg/mL — ABNORMAL HIGH (ref 0.0–100.0)

## 2020-12-04 MED ORDER — FAMOTIDINE IN NACL 20-0.9 MG/50ML-% IV SOLN
20.0000 mg | Freq: Once | INTRAVENOUS | Status: DC | PRN
Start: 2020-12-04 — End: 2020-12-04

## 2020-12-04 MED ORDER — METHYLPREDNISOLONE SODIUM SUCC 125 MG IJ SOLR
125.0000 mg | Freq: Once | INTRAMUSCULAR | Status: DC | PRN
Start: 2020-12-04 — End: 2020-12-04

## 2020-12-04 MED ORDER — BEBTELOVIMAB 175 MG/2 ML IV (EUA)
175.0000 mg | Freq: Once | INTRAMUSCULAR | Status: AC
Start: 2020-12-04 — End: 2020-12-04
  Administered 2020-12-04: 175 mg via INTRAVENOUS
  Filled 2020-12-04: qty 2

## 2020-12-04 MED ORDER — ALBUTEROL SULFATE HFA 108 (90 BASE) MCG/ACT IN AERS: 2.0000 | INHALATION_SPRAY | Freq: Once | RESPIRATORY_TRACT | Status: DC | PRN

## 2020-12-04 MED ORDER — EPINEPHRINE 0.3 MG/0.3ML IJ SOAJ
0.3000 mg | Freq: Once | INTRAMUSCULAR | Status: DC | PRN
Start: 2020-12-04 — End: 2020-12-04

## 2020-12-04 MED ORDER — DIPHENHYDRAMINE HCL 50 MG/ML IJ SOLN
50.0000 mg | Freq: Once | INTRAMUSCULAR | Status: DC | PRN
Start: 2020-12-04 — End: 2020-12-04

## 2020-12-04 MED ORDER — SODIUM CHLORIDE 0.9 % IV SOLN
INTRAVENOUS | Status: DC | PRN
Start: 2020-12-04 — End: 2020-12-04

## 2020-12-04 NOTE — ED Provider Notes (Signed)
[facility] EMERGENCY DEPARTMENT Provider Note   CSN: [id number] Arrival date & time: 12/04/20  1203     History Chief Complaint  Patient presents with   Weakness    [name] is a 80 y.o. male.  HPI  Patient with significant medical history of CAD, CHF, EF of 49, hypertension, atrial fibs currently on Eliquis renal disease presents to the emergency department with chief complaint of generalized weakness.  Patient states he has been having this for the last 6 months, states that when he changes position he will get "swimmy headed", describes as a dizzy like sensation, will loses his balance and then fall.  States that the dizziness will generally go away on, dizziness provoked with change in positions.  He denies change in vision, paresthesias or weakness in the upper and/or lower extremities, he denies having chest pain, shortness of breath, nausea, vomiting, prior to his dizzy spells.  He currently does not have this at this time.  He has had no recent falls, he states that he was unable to get up today but he states that this is normal for him, he generally calls for the neighbors and they come and help him up.  He has no new symptoms today, he states he feels like he normally does, he just wants answers to why he feels like this.  Patient does not endorse fevers, chills, chest pain, shortness of breath, abdominal pain, nausea, vomit, diarrhea, worsening pedal edema.  After reviewing patient's chart patient was recently seen here 2 weeks ago, for similar presentation, he had a benign work-up, noted that he has a nondisplaced left anterior seventh rib fracture.  It appears that patient has a history of orthostatic hypotension and has frequent falls.  Currently being followed by Dr. Aundra Dubin, he recently increased his midordine increased to 15 mg 3 times daily and kept his Lasix is 80 mg daily.  He is supposed be wearing compression stockings which she is currently not wearing at this  time.  I spoke with patient's wife who states that over the last 6 months patient has been declining, states that he has had more frequent falls, she states that she thinks it is coming from the orthostatic hypotension but is concerned about these "freezing spells" where his upper and lower extremities bilaterally will be frozen this will eventually dissipate on its own sometimes it takes 15 minutes to a couple hours.  She states that he does not lose consciousness, but does not slur his words, does not become incontinent.   Past Medical History:  Diagnosis Date   CAD (coronary artery disease)    a. s/p CABG 1997 b. PCI (BMS) of SVG to RCA 3/09 c. redo CABG 02/2011 with SVG to PDA, SVG to Lcx d. Cath 07/2013: ischemic cardiomyopathy with LVEF less than 20%, occlusion of the SVG's placed in 2012 e.stenting of LM in 07/2015 with PCI of LCx performed as well   CHF (congestive heart failure) (HCC)    Chronic systolic dysfunction of left ventricle    EF 30%   Depression    DJD (degenerative joint disease)    DM (diabetes mellitus) ([place]) 02/14/2011   GERD (gastroesophageal reflux disease)    Gout    HTN (hypertension) 02/14/2011   Hyperlipemia    Ischemic cardiomyopathy    s/p ICD Implantation by Dr Leonia Reeves   Paroxysmal atrial fibrillation ([place]) 10/22/2014   single episode of AF x 3 hours 48 minutes recorded on ICD, chads2vasc score is at  least 5    Renal disorder    Ventricular tachycardia ([place]) 07/23/2020   appropriate ICD therapy delivered for VT at 222 bpm    Patient Active Problem List   Diagnosis Date Noted   Ventricular fibrillation ([place]) 07/23/2020   Syncope and collapse 09/24/2017   GERD (gastroesophageal reflux disease) 09/24/2017   Type 2 diabetes mellitus ([place]) 09/24/2017   Chronic combined systolic and diastolic CHF (congestive heart failure) ([place]) 09/24/2017   Acute renal failure superimposed on stage 3 chronic kidney disease ([place]) 09/24/2017   Syncope    Acute kidney  injury ([place]) 05/01/2017   Confusion 05/01/2017   NSTEMI (non-ST elevated myocardial infarction) ([place])    Unstable angina ([place])    Chest pain with high risk of acute coronary syndrome 08/01/2015   CKD (chronic kidney disease) stage 3, GFR 30-59 ml/min (HCC) 06/14/2015   Angina pectoris ([place]) 02/17/2015   Paroxysmal atrial fibrillation ([place]) 01/11/2015   Hypersomnia [id number]   Chronic systolic CHF (congestive heart failure) ([place]) 12/12/2012   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   Chronic cholecystitis with calculus 01/02/2012   Nausea vomiting and diarrhea 12/24/2011   Dizziness 12/24/2011   Preop cardiovascular exam 12/24/2011   CAD (coronary artery disease) 02/14/2011   HTN (hypertension) 02/14/2011   DM (diabetes mellitus) ([place]) 02/14/2011   Ischemic cardiomyopathy 10/14/2010   Hyperlipemia 10/14/2010    Past Surgical History:  Procedure Laterality Date   BI-VENTRICULAR IMPLANTABLE CARDIOVERTER DEFIBRILLATOR UPGRADE N/A 08/24/2011   Procedure: BI-VENTRICULAR IMPLANTABLE CARDIOVERTER DEFIBRILLATOR UPGRADE;  Surgeon: Evans Lance, MD;  Location: [facility] CATH LAB;  Service: Cardiovascular;  Laterality: N/A;   BIV ICD GENERATOR CHANGEOUT N/A 02/13/2018   Procedure: BIV ICD GENERATOR CHANGEOUT;  Surgeon: Thompson Grayer, MD;  Location: [place] CV LAB;  Service: Cardiovascular;  Laterality: N/A;   CARDIAC CATHETERIZATION  08/03/2015   Procedure: Left Heart Cath and Cors/Grafts Angiography;  Surgeon: Peter M Martinique, MD;  Location: [place] CV LAB;  Service: Cardiovascular;;   CARDIAC CATHETERIZATION N/A 08/06/2015   Procedure: Coronary Stent Intervention w/Impella;  Surgeon: Jettie Booze, MD;  Location: [place] CV LAB;  Service: Cardiovascular;  Laterality: N/A;   CARDIAC CATHETERIZATION  08/06/2015   Procedure: Left Heart Cath;  Surgeon: Jettie Booze, MD;  Location: [place] CV LAB;  Service: Cardiovascular;;   CARDIAC CATHETERIZATION   08/06/2015   Procedure: Coronary Balloon Angioplasty;  Surgeon: Jettie Booze, MD;  Location: [place] CV LAB;  Service: Cardiovascular;;   CARDIAC DEFIBRILLATOR PLACEMENT  08/2004   initial placement, upgraded to [place] ICD by Dr Lovena Le 08/24/11 (MDT)   CARDIOVERSION N/A 09/26/2019   Procedure: CARDIOVERSION;  Surgeon: Larey Dresser, MD;  Location: [facility] ENDOSCOPY;  Service: Cardiovascular;  Laterality: N/A;   CATARACT EXTRACTION W/ INTRAOCULAR LENS  IMPLANT, BILATERAL  2012   CHOLECYSTECTOMY  01/05/2012   Procedure: LAPAROSCOPIC CHOLECYSTECTOMY WITH INTRAOPERATIVE CHOLANGIOGRAM;  Surgeon: Joyice Faster. Cornett, MD;  Location: [place];  Service: General;  Laterality: N/A;  laparoscopic cholecysectoym with intraoperative cholangiogram   COLONOSCOPY WITH PROPOFOL N/A 11/18/2013   Procedure: COLONOSCOPY WITH PROPOFOL;  Surgeon: Garlan Fair, MD;  Location: [facility] ENDOSCOPY;  Service: Endoscopy;  Laterality: N/A;   CORONARY ANGIOPLASTY WITH STENT PLACEMENT  06/2007   BMS to SVG to RCA   CORONARY ARTERY BYPASS GRAFT  01/1996   CABG x 5 LIMA to LAD SVG to diag1,2,svg to om,svg to RCA   CORONARY ARTERY BYPASS GRAFT  03/06/2011   CABG X2; Procedure: REDO  CORONARY ARTERY BYPASS GRAFTING (CABG);  Surgeon: Grace Isaac, MD;  Location: [place];  Service: Open Heart Surgery;  Laterality: N/A;  times two grafts using right saphenous vein harvested endoscopically.   KNEE ARTHROTOMY  ~ 1978   RIGHT KNEE CARTILAGE REMOVED   LEFT HEART CATHETERIZATION WITH CORONARY ANGIOGRAM N/A 06/06/2011   Procedure: LEFT HEART CATHETERIZATION WITH CORONARY ANGIOGRAM;  Surgeon: Sueanne Margarita, MD;  Location: [place] CATH LAB;  Service: Cardiovascular;  Laterality: N/A;   LEFT HEART CATHETERIZATION WITH CORONARY/GRAFT ANGIOGRAM N/A 08/08/2013   Procedure: LEFT HEART CATHETERIZATION WITH Beatrix Fetters;  Surgeon: Sinclair Grooms, MD;  Location: [facility] CATH LAB;  Service: Cardiovascular;  Laterality: N/A;   LUMBAR DISC SURGERY  2003    RIGHT HEART CATHETERIZATION N/A 02/17/2014   Procedure: RIGHT HEART CATH;  Surgeon: Larey Dresser, MD;  Location: [facility] CATH LAB;  Service: Cardiovascular;  Laterality: N/A;   RIGHT/LEFT HEART CATH AND CORONARY/GRAFT ANGIOGRAPHY N/A 07/26/2020   Procedure: RIGHT/LEFT HEART CATH AND CORONARY/GRAFT ANGIOGRAPHY;  Surgeon: Larey Dresser, MD;  Location: [place] CV LAB;  Service: Cardiovascular;  Laterality: N/A;   TEE WITHOUT CARDIOVERSION N/A 09/26/2019   Procedure: TRANSESOPHAGEAL ECHOCARDIOGRAM (TEE);  Surgeon: Larey Dresser, MD;  Location: [facility] ENDOSCOPY;  Service: Cardiovascular;  Laterality: N/A;   VENOGRAM N/A 06/08/2011   Procedure: VENOGRAM;  Surgeon: Thompson Grayer, MD;  Location: [facility] CATH LAB;  Service: Cardiovascular;  Laterality: N/A;       Family History  Problem Relation Age of Onset   Heart failure Mother    Heart attack Mother    Heart failure Father    Heart attack Father    Coronary artery disease Other    Sudden Cardiac Death Brother        in his 36's   Heart attack Brother    Cancer Brother     Social History   Tobacco Use   Smoking status: Former    Packs/day: 0.50    Years: 15.00    Pack years: 7.50    Types: Cigarettes    Quit date: 04/17/1969    Years since quitting: 51.6   Smokeless tobacco: Former    Quit date: 05/05/1971  Vaping Use   Vaping Use: Never used  Substance Use Topics   Alcohol use: Yes    Alcohol/week: 1.0 standard drink    Types: 1 Cans of beer per week    Comment: occasional   Drug use: No    Home Medications Prior to Admission medications   Medication Sig Start Date End Date Taking? Authorizing Provider  acetaminophen (TYLENOL) 500 MG tablet Take 1,000 mg by mouth every 6 (six) hours as needed for moderate pain.    [provider]  allopurinol (ZYLOPRIM) 100 MG tablet Take 200 mg by mouth daily.    [provider]  amiodarone (PACERONE) 200 MG tablet Take 1 tablet (200 mg total) by mouth daily. 07/26/20    Larey Dresser, MD  atorvastatin (LIPITOR) 80 MG tablet TAKE 1 TABLET BY MOUTH ONCE DAILY AT  6  PM  IN  THE  EVENING Patient taking differently: Take 80 mg by mouth every evening. 11/08/20   Larey Dresser, MD  Continuous Blood Gluc Sensor (FREESTYLE LIBRE 14 DAY SENSOR) MISC USE TO CHECK GLUCOSE THREE TIMES DAILY AS DIRECTED 11/06/18   [provider]  ELIQUIS 5 MG TABS tablet Take 1 tablet by mouth twice daily 09/14/20   Allred, Jeneen Rinks, MD  furosemide (LASIX)  40 MG tablet Take 2 tablets (80 mg total) every morning.  Take 1 tablet in the evening only if there is fluid build up or shortness of breath. 11/24/20   Isla Pence, MD  HYDROcodone-acetaminophen (NORCO/VICODIN) 5-325 MG tablet Take 1 tablet by mouth every 4 (four) hours as needed. 11/24/20   Isla Pence, MD  insulin aspart protamine- aspart (NOVOLOG MIX 70/30) (70-30) 100 UNIT/ML injection Inject into the skin daily as needed. According to blood sugar    [provider]  loperamide (IMODIUM A-D) 2 MG tablet Take 2 mg by mouth 4 (four) times daily as needed for diarrhea or loose stools.    [provider]  midodrine (PROAMATINE) 10 MG tablet Take 1.5 tablets (15 mg total) by mouth 3 (three) times daily with meals. 11/24/20 12/24/20  Isla Pence, MD  nitroGLYCERIN (NITROSTAT) 0.4 MG SL tablet Place 1 tablet (0.4 mg total) under the tongue every 5 (five) minutes as needed for chest pain. DO NOT EXCEED A TOTAL OF 3 DOSES IN 15 MINUTES 11/16/20   [place], [place], [place]  potassium chloride SA (KLOR-CON) 20 MEQ tablet Take 2 tablets (40 mEq total) by mouth daily. Patient not taking: No sig reported 07/27/20   Clegg, Amy D, NP  QUEtiapine (SEROQUEL) 100 MG tablet Take 100 mg by mouth at bedtime.    [provider]  ranolazine (RANEXA) 500 MG 12 hr tablet Take 500 mg by mouth 2 (two) times daily.    [provider]  triamcinolone cream (KENALOG) 0.1 % Apply 1 application topically 2 (two) times daily  as needed (for dry/irritated skin.).    [provider]  venlafaxine (EFFEXOR-XR) 150 MG 24 hr capsule Take 150 mg by mouth daily with breakfast.    [provider]  vitamin B-12 (CYANOCOBALAMIN) 1000 MCG tablet Take 1,000 mcg by mouth daily.    [provider]    Allergies    Codeine, Metformin hcl, Other, Trazodone, and Adhesive [tape]  Review of Systems   Review of Systems  Constitutional:  Negative for chills and fever.  HENT:  Negative for congestion.   Respiratory:  Negative for shortness of breath.   Cardiovascular:  Negative for chest pain.  Gastrointestinal:  Negative for abdominal pain.  Genitourinary:  Negative for enuresis.  Musculoskeletal:  Negative for back pain.  Skin:  Negative for rash.  Neurological:  Positive for dizziness and weakness.  Hematological:  Does not bruise/bleed easily.   Physical Exam Updated Vital Signs BP 136/65   Pulse (!) 59   Temp 99 F (37.2 C) (Oral)   Resp 16   Ht '5\' 9"'$  (1.753 m)   Wt 81.2 kg   SpO2 98%   BMI 26.43 kg/m   Physical Exam Vitals and nursing note reviewed.  Constitutional:      General: He is not in acute distress.    Appearance: He is not ill-appearing.  HENT:     Head: Normocephalic and atraumatic.     Nose: No congestion.  Eyes:     Extraocular Movements: Extraocular movements intact.     Conjunctiva/sclera: Conjunctivae normal.  Cardiovascular:     Rate and Rhythm: Normal rate and regular rhythm.     Pulses: Normal pulses.     Heart sounds: No murmur heard.   No friction rub. No gallop.  Pulmonary:     Effort: No respiratory distress.     Breath sounds: Rales present. No wheezing or rhonchi.     Comments: Slight bibasilar rales  heard in the left lower lobe. Abdominal:     Palpations: Abdomen is soft.     Tenderness: There is no abdominal tenderness. There is no right CVA tenderness or left CVA tenderness.  Musculoskeletal:     Cervical back: No tenderness.     Right lower  leg: No edema.     Left lower leg: No edema.     Comments: Patient has 5 of 5 strength, full range of motion upper and lower extremities.  Skin:    General: Skin is warm and dry.  Neurological:     Mental Status: He is alert.     GCS: GCS eye subscore is 4. GCS verbal subscore is 5. GCS motor subscore is 6.     Motor: Motor function is intact. No weakness.     Coordination: Romberg sign negative. Finger-Nose-Finger Test normal.     Comments: nerves II through XII are grossly intact, patient is having no difficulty word finding, no slurring of his words, able to follow two-step commands, no unilateral weakness present.  Psychiatric:        Mood and Affect: Mood normal.    ED Results / Procedures / Treatments   Labs (all labs ordered are listed, but only abnormal results are displayed) Labs Reviewed  RESP PANEL BY RT-PCR (FLU A&B, COVID) ARPGX2 - Abnormal; Notable for the following components:      Result Value   SARS Coronavirus 2 by RT PCR POSITIVE (*)    All other components within normal limits  BASIC METABOLIC PANEL - Abnormal; Notable for the following components:   Sodium 133 (*)    Chloride 97 (*)    Glucose, Bld 115 (*)    BUN 28 (*)    Creatinine, Ser 1.75 (*)    Calcium 8.3 (*)    GFR, Estimated 39 (*)    All other components within normal limits  BRAIN NATRIURETIC PEPTIDE - Abnormal; Notable for the following components:   B Natriuretic Peptide 456.0 (*)    All other components within normal limits  CBC WITH DIFFERENTIAL/PLATELET - Abnormal; Notable for the following components:   RBC 3.65 (*)    Hemoglobin 12.0 (*)    HCT 37.5 (*)    MCV 102.7 (*)    Monocytes Absolute 1.2 (*)    All other components within normal limits    EKG None  Radiology CT HEAD WO CONTRAST (5MM)  Result Date: 12/04/2020 CLINICAL DATA:  Chronic headache, weakness for 2 months EXAM: CT HEAD WITHOUT CONTRAST TECHNIQUE: Contiguous axial images were obtained from the base of the skull  through the vertex without intravenous contrast. COMPARISON:  11/24/2020 FINDINGS: Brain: No acute infarct or hemorrhage. Lateral ventricles and midline structures appear unremarkable. No acute extra-axial fluid collections. No mass effect. Vascular: Stable atherosclerosis.  No hyperdense vessel. Skull: Normal. Negative for fracture or focal lesion. Sinuses/Orbits: Minimal mucosal thickening within the sphenoid sinus unchanged. Remaining paranasal sinuses are clear. Other: None. IMPRESSION: 1. Stable head CT, no acute intracranial process. Electronically Signed   By: Randa Ngo M.D.   On: 12/04/2020 15:44   DG Chest Portable 1 View  Result Date: 12/04/2020 CLINICAL DATA:  Pt c/o weakness for several months. Recent exposure to Covid. Freestyle Libre device removed prior to imaging. Hx of asthma, hypertension and diabetes. EXAM: PORTABLE CHEST 1 VIEW COMPARISON:  11/24/2020. FINDINGS: Stable changes from prior cardiac surgery. Left anterior chest wall biventricular cardioverter-defibrillator is also stable. No mediastinal or hilar masses. Mild linear opacities at  the left lung base, stable from the prior study, consistent with atelectasis or scarring. Remainder of the lungs is clear. No convincing pleural effusion and no pneumothorax. Skeletal structures are grossly intact. IMPRESSION: No active disease. Electronically Signed   By: Lajean Manes M.D.   On: 12/04/2020 14:47    Procedures Procedures   Medications Ordered in ED Medications  0.9 %  sodium chloride infusion (has no administration in time range)  diphenhydrAMINE (BENADRYL) injection 50 mg (has no administration in time range)  famotidine (PEPCID) IVPB 20 mg premix (has no administration in time range)  methylPREDNISolone sodium succinate (SOLU-MEDROL) 125 mg/2 mL injection 125 mg (has no administration in time range)  albuterol (VENTOLIN HFA) 108 (90 Base) MCG/ACT inhaler 2 puff (has no administration in time range)  EPINEPHrine  (EPI-PEN) injection 0.3 mg (has no administration in time range)  bebtelovimab EUA injection SOLN 175 mg (175 mg Intravenous Given 12/04/20 1705)    ED Course  I have reviewed the triage vital signs and the nursing notes.  Pertinent labs & imaging results that were available during my care of the patient were reviewed by me and considered in my medical decision making (see chart for details).    MDM Rules/Calculators/A&P                          Initial impression-patient presents with concerns of generalized weakness, he is alert, does not appear in acute stress, vital signs reassuring.  Unclear etiology, will obtain basic lab work-up, add on CT head due to frequent falls while on a blood thinner, will ambulate as well as obtain orthopedic vital signs.  Work-up-CBC shows macrocytic anemia hemoglobin of 12, BMP shows hyponatremia 133, hyperglycemia of 115, BUN 28, creatinine 1.75 appears at baseline, GFR 39 appears to be a baseline for patient.  BNP shows 456 slightly above his baseline, respiratory panel positive for COVID.  Chest x-ray negative for acute findings, EKG sinus rhythm without signs of ischemia, CT head negative for acute findings.  Patient was ambulated without difficulty, he had a positive orthostatic reading.  Reassessment-patient is reevaluated has no complaints this time, states he is feeling fine.  Told him that he had COVID, recommend treatment.  He is agreement this plan.  Patient tolerated treatment well, vital signs remained stable, patient cleared for discharge.  Rule out- low suspicion for CVA or intracranial head bleed as patient denies change in vision, paresthesias or weakness to upper lower extremities, no neuro deficits noted on exam, CT head did not reveal any acute findings.  Low suspicion for ACS or arrhythmias as patient denies chest pain, shortness of breath, no hypoperfusion or fluid overload on exam, EKG sinus without signs of ischemia.  Patient does have an  elevated BNP but there is no noted pedal edema, x-ray was unremarkable, he is not endorsing shortness of shortness of breath or orthopnea.  Low suspicion for systemic infection as patient is nontoxic-appearing, vital signs reassuring, no obvious source infection noted on exam.  It is noted that patient had positive orthostatics but due to his slightly elevated BNP and having EF of 30% will defer on providing him with fluids.  we will have him continue with the mid around and follow-up with cardiology for further evaluation of this.   Plan-  Dizziness-likely secondary due to orthostatic hypotension, will have him continue with the milrinone, follow-up with cardiology for further evaluation. COVID-provide patient with COVID treatment, possible this could be contributing  to his generalized weakness.  will have him follow-up with post-COVID care for further evaluation.   Vital signs have remained stable, no indication for hospital admission.  Patient discussed with attending and they agreed with assessment and plan.  Patient given at home care as well strict return precautions.  Patient verbalized that they understood agreed to said plan.  Final Clinical Impression(s) / ED Diagnoses Final diagnoses:  Weakness  COVID    Rx / DC Orders ED Discharge Orders     None        Marcello Fennel, PA-C 12/04/20 1819    Teressa Lower, MD 12/05/20 [phone]

## 2020-12-04 NOTE — ED Triage Notes (Addendum)
Pt. Arrived via EMS. Wife has covid. Per pt. He feels week and hasn't been able to get up without help.  CBG 124 at home. Pt. Took tylenol at home without a fever for pain. Pt. States they have been feeling weak for two to three months.

## 2020-12-04 NOTE — ED Notes (Signed)
Patient transported to CT 

## 2020-12-04 NOTE — Discharge Instructions (Addendum)
Lab work and imaging were all reassuring.  Please continue with all home medication as prescribed.  BNP was slightly elevated today please follow-up with your cardiologist for further evaluation. .  follow-up with neurology for further evaluation of the freezing spells.  He has tested positive for COVID, has gotten his covid treatment today, he must self quarantine for a total of 10 days starting on symptom onset.  He may follow-up with post-COVID care for further evaluation.  Come back to the emergency department if you develop chest pain, shortness of breath, severe abdominal pain, uncontrolled nausea, vomiting, diarrhea.

## 2020-12-06 ENCOUNTER — Ambulatory Visit: Payer: Medicare HMO | Admitting: Physical Therapy

## 2020-12-07 DIAGNOSIS — R058 Other specified cough: Secondary | ICD-10-CM | POA: Diagnosis not present

## 2020-12-07 DIAGNOSIS — U071 COVID-19: Secondary | ICD-10-CM | POA: Diagnosis not present

## 2020-12-08 NOTE — Progress Notes (Signed)
Remote ICD transmission.   

## 2020-12-13 ENCOUNTER — Ambulatory Visit: Payer: Medicare HMO | Admitting: Physical Therapy

## 2020-12-13 ENCOUNTER — Ambulatory Visit (INDEPENDENT_AMBULATORY_CARE_PROVIDER_SITE_OTHER): Payer: Medicare HMO

## 2020-12-13 ENCOUNTER — Other Ambulatory Visit: Payer: Self-pay

## 2020-12-13 ENCOUNTER — Encounter: Payer: Self-pay | Admitting: Physical Therapy

## 2020-12-13 ENCOUNTER — Telehealth: Payer: Self-pay | Admitting: Physical Therapy

## 2020-12-13 VITALS — BP 95/48 | HR 66

## 2020-12-13 DIAGNOSIS — M6281 Muscle weakness (generalized): Secondary | ICD-10-CM

## 2020-12-13 DIAGNOSIS — R2681 Unsteadiness on feet: Secondary | ICD-10-CM | POA: Diagnosis not present

## 2020-12-13 DIAGNOSIS — R2689 Other abnormalities of gait and mobility: Secondary | ICD-10-CM | POA: Diagnosis not present

## 2020-12-13 DIAGNOSIS — Z9181 History of falling: Secondary | ICD-10-CM | POA: Diagnosis not present

## 2020-12-13 DIAGNOSIS — I5042 Chronic combined systolic (congestive) and diastolic (congestive) heart failure: Secondary | ICD-10-CM

## 2020-12-13 DIAGNOSIS — Z95810 Presence of automatic (implantable) cardiac defibrillator: Secondary | ICD-10-CM

## 2020-12-13 NOTE — Telephone Encounter (Signed)
Faxed to provider (not in Epic):   Dr. Brigitte Pulse, [name] has been working with PT at [facility]. He would benefit from an order for a four wheeled walker with seat to help improve his safety with gait due to weakness, imbalance, and history of falls.  If you agree, please place an order in [facility] workque in [facility] or fax the order to [phone]. Thank you, Janann August, PT, DPT 12/13/20 12:13 PM    [place] [address] [place], [place]  [address] Phone:  [phone] Fax:  [phone]

## 2020-12-13 NOTE — Therapy (Signed)
[facility] [address] [place], [place], [address] Phone: [phone]   Fax:  [phone]  Physical Therapy Treatment  Patient Details  Name: [name] MRN: [id number] Date of Birth: [birth year] Referring Provider (PT): Mayra Neer, MD   Encounter Date: 12/13/2020   PT End of Session - 12/13/20 1028     Visit Number 5    Number of Visits 9    Date for PT Re-Evaluation 01/13/21    Authorization Type [employer]- $35 co pay    PT Start Time [phone]   pt late to session   PT Stop Time 1016    PT Time Calculation (min) 40 min    Equipment Utilized During Treatment Gait belt    Activity Tolerance Patient tolerated treatment well   limited by orthostatics for standing activity   Behavior During Therapy [facility] for tasks assessed/performed             Past Medical History:  Diagnosis Date   CAD (coronary artery disease)    a. s/p CABG 1997 b. PCI (BMS) of SVG to RCA 3/09 c. redo CABG 02/2011 with SVG to PDA, SVG to Lcx d. Cath 07/2013: ischemic cardiomyopathy with LVEF less than 20%, occlusion of the SVG's placed in 2012 e.stenting of LM in 07/2015 with PCI of LCx performed as well   CHF (congestive heart failure) (HCC)    Chronic systolic dysfunction of left ventricle    EF 30%   Depression    DJD (degenerative joint disease)    DM (diabetes mellitus) ([place]) 02/14/2011   GERD (gastroesophageal reflux disease)    Gout    HTN (hypertension) 02/14/2011   Hyperlipemia    Ischemic cardiomyopathy    s/p ICD Implantation by Dr Leonia Reeves   Paroxysmal atrial fibrillation ([place]) 10/22/2014   single episode of AF x 3 hours 48 minutes recorded on ICD, [email or web address] score is at least 5    Renal disorder    Ventricular tachycardia ([place]) 07/23/2020   appropriate ICD therapy delivered for VT at 222 bpm    Past Surgical History:  Procedure Laterality Date   BI-VENTRICULAR IMPLANTABLE CARDIOVERTER DEFIBRILLATOR UPGRADE N/A  08/24/2011   Procedure: BI-VENTRICULAR IMPLANTABLE CARDIOVERTER DEFIBRILLATOR UPGRADE;  Surgeon: Evans Lance, MD;  Location: [facility] CATH LAB;  Service: Cardiovascular;  Laterality: N/A;   BIV ICD GENERATOR CHANGEOUT N/A 02/13/2018   Procedure: BIV ICD GENERATOR CHANGEOUT;  Surgeon: Thompson Grayer, MD;  Location: [facility] CV LAB;  Service: Cardiovascular;  Laterality: N/A;   CARDIAC CATHETERIZATION  08/03/2015   Procedure: Left Heart Cath and Cors/Grafts Angiography;  Surgeon: Peter M Martinique, MD;  Location: [place] CV LAB;  Service: Cardiovascular;;   CARDIAC CATHETERIZATION N/A 08/06/2015   Procedure: Coronary Stent Intervention w/Impella;  Surgeon: Jettie Booze, MD;  Location: [facility] CV LAB;  Service: Cardiovascular;  Laterality: N/A;   CARDIAC CATHETERIZATION  08/06/2015   Procedure: Left Heart Cath;  Surgeon: Jettie Booze, MD;  Location: [place] CV LAB;  Service: Cardiovascular;;   CARDIAC CATHETERIZATION  08/06/2015   Procedure: Coronary Balloon Angioplasty;  Surgeon: Jettie Booze, MD;  Location: [facility] CV LAB;  Service: Cardiovascular;;   CARDIAC DEFIBRILLATOR PLACEMENT  08/2004   initial placement, upgraded to [place] ICD by Dr Lovena Le 08/24/11 (MDT)   CARDIOVERSION N/A 09/26/2019   Procedure: CARDIOVERSION;  Surgeon: Larey Dresser, MD;  Location: [facility];  Service: Cardiovascular;  Laterality: N/A;   CATARACT EXTRACTION W/ INTRAOCULAR LENS  IMPLANT, BILATERAL  2012   CHOLECYSTECTOMY  01/05/2012   Procedure: LAPAROSCOPIC CHOLECYSTECTOMY WITH INTRAOPERATIVE CHOLANGIOGRAM;  Surgeon: Joyice Faster. Cornett, MD;  Location: [facility];  Service: General;  Laterality: N/A;  laparoscopic cholecysectoym with intraoperative cholangiogram   COLONOSCOPY WITH PROPOFOL N/A 11/18/2013   Procedure: COLONOSCOPY WITH PROPOFOL;  Surgeon: Garlan Fair, MD;  Location: [facility] ENDOSCOPY;  Service: Endoscopy;  Laterality: N/A;   CORONARY ANGIOPLASTY WITH STENT PLACEMENT  06/2007   BMS to SVG  to RCA   CORONARY ARTERY BYPASS GRAFT  01/1996   CABG x 5 LIMA to LAD SVG to diag1,2,svg to om,svg to RCA   CORONARY ARTERY BYPASS GRAFT  03/06/2011   CABG X2; Procedure: REDO CORONARY ARTERY BYPASS GRAFTING (CABG);  Surgeon: Grace Isaac, MD;  Location: [facility];  Service: Open Heart Surgery;  Laterality: N/A;  times two grafts using right saphenous vein harvested endoscopically.   KNEE ARTHROTOMY  ~ 1978   RIGHT KNEE CARTILAGE REMOVED   LEFT HEART CATHETERIZATION WITH CORONARY ANGIOGRAM N/A 06/06/2011   Procedure: LEFT HEART CATHETERIZATION WITH CORONARY ANGIOGRAM;  Surgeon: Sueanne Margarita, MD;  Location: [facility] CATH LAB;  Service: Cardiovascular;  Laterality: N/A;   LEFT HEART CATHETERIZATION WITH CORONARY/GRAFT ANGIOGRAM N/A 08/08/2013   Procedure: LEFT HEART CATHETERIZATION WITH Beatrix Fetters;  Surgeon: Sinclair Grooms, MD;  Location: [facility] CATH LAB;  Service: Cardiovascular;  Laterality: N/A;   LUMBAR DISC SURGERY  2003   RIGHT HEART CATHETERIZATION N/A 02/17/2014   Procedure: RIGHT HEART CATH;  Surgeon: Larey Dresser, MD;  [facility] C[facility] LAB;  Service: Cardiovascular;  Laterality: N/A;   RIGHT/LEFT HEART CATH AND CORONARY/GRAFT ANGIOGRAPHY N/A 07/26/2020   Procedure: RIGHT/LEFT HEART CATH AND CORONARY/GRAFT ANGIOGRAPHY;  Surgeon: Larey Dresser, MD;  Location: [place] [facility] CV LAB;  Service: Cardiovascular;  Laterality: N/A;   TEE WITHOUT CARDIOVERSION N/A 09/26/2019   Procedure: TRANSESOPHAGEAL ECHOCARDIOGRAM (TEE);  Surgeon: Larey Dresser, MD;  Location: [facility] ENDOSCOPY;  Service: Cardiovascular;  Laterality: N/A;   VENOGRAM N/A 06/08/2011   Procedure: VENOGRAM;  Surgeon: Thompson Grayer, MD;  Location: [facility] CATH LAB;  Service: Cardiovascular;  Laterality: N/A;    Vitals:   12/13/20 0942 12/13/20 0946  BP: (!) 112/58 (!) 95/48  Pulse: 63 66     Subjective Assessment - 12/13/20 0939     Subjective Recently had COVID, is feeling much better. Is within time frame to return to  therapy. Had a fall 3 weeks ago - hit his abdomen and went to the ER and was diagnosed with a rib fracture. Feeling swimmyheaded today (more lightheaded)    Patient is accompained by: Family member   wife   Pertinent History PMH: chronic systolic CHF, CAD s/p CABG and redo, CKD stage III, HLD, a fib, DM, implantable cardioverter-defibrillator    Limitations Walking;House hold activities;Standing    Patient Stated Goals improve the balance - does not want to use a cane.    Currently in Pain? No/denies                               [facility] Adult PT Treatment/Exercise - 12/13/20 1032       Exercises   Exercises Knee/Hip      Knee/Hip Exercises: Standing   Other Standing Knee Exercises attempted standing marching with 2# ankle weights standing at rollator, pt reporting feeling swimmyheaded - performed remainder of exercises during session in seated  Knee/Hip Exercises: Seated   Long Arc Quad Strengthening;AROM;Both;2 sets;10 reps    Long Arc Quad Weight 2 lbs.    Long [employer] Limitations cues for isometric hold    [facility] 2 x 10 reps with cues for 3 second holds    Clamshell with TheraBand Red   x10 reps each leg   Other Seated Knee/Hip Exercises seated resisted knee flexion with red tband 2 x 10 reps B - pt reporting decr in pain in knee when performing, added to HEP    Other Seated Knee/Hip Exercises with 2# ankle weight stepping out and over yardstick and back to midline for hip flexion/ ABD x10 reps each leg, needing verbal and demo cues for proper technique    Marching Strengthening;AROM;Both;1 set;10 reps    Marching Limitations with 2# ankle weight                    PT Education - 12/13/20 1025     Education Details PT to send order request to pt's physician regarding an order for a new rollator (pt has one but it was given to him and the brakes do not function as well as they should). pt with orthostatics/feeling swimmyheaded today,  reiterated importance of staying hydrated, taking time with transitions and sit <> stands, pt performing seated marching/ankle pumps before standing for improved circulation and making sure pt is using rollator at all times. added seated resisted knee flexion to HEP    Person(s) Educated Patient    Methods Explanation;Demonstration;Handout    Comprehension Verbalized understanding;Returned demonstration              PT Short Term Goals - 11/22/20 1219       PT SHORT TERM GOAL #1   Title Pt will finish assessment of BERG with LTG written as appropriate. ALL STGS DUE 11/13/20    Baseline 32/56 on7/25/22    Time 4    Period Weeks    Status Achieved    Target Date 12/14/20      PT SHORT TERM GOAL #2   Title Pt and pt's spouse will verbalize understanding of fall prevention in the home.    Baseline provided handout on 11/15/20 and discussed with pt and pt's spouse    Time 4    Period Weeks    Status Achieved      PT SHORT TERM GOAL #3   Title Pt will decr 5x sit <> stand time to 23 seconds or less with BUE support in order to demo improved functional BLE strength for transfers and decr fall risk.    Baseline 22.4 seconds with BUE support from mat table on 11/22/20    Time 4    Period Weeks    Status Achieved      PT SHORT TERM GOAL #4   Title Pt will improve gait speed to at least 2.0 ft/sec with LRAD in order to demo improved community mobility.    Baseline 1.84 ft/sec    Time 4    Period Weeks    Status New               PT Long Term Goals - 12/13/20 1025       PT LONG TERM GOAL #1   Title Pt will be independent with final HEP with wife's supervision in order to build upon functional gains made in therapy. ALL LTGS DUE 12/14/20    Time 8    Period Weeks  Status New      PT LONG TERM GOAL #2   Title Pt will improve BERG to at least a 39/56 in order to demo decr fall risk.    Baseline 32/56    Time 8    Period Weeks    Status Revised      PT LONG TERM GOAL #3    Title Pt will decr 5x sit <> stand time to 18 seconds or less  with BUE support in order to demo decr fall risk.    Baseline 26.38 seconds with BUE support.    Time 8    Period Weeks    Status New      PT LONG TERM GOAL #4   Title Pt will recover posterior and anterior balance in push and release test in 2 or less robust steps independently, for improved balance recovery    Baseline anterior = unable to ellicit, posterior = 2 delayed small steps.    Time 8    Period Weeks    Status New      PT LONG TERM GOAL #5   Title Pt will improve gait speed to at least 2.32f/sec with LRAD in order to demo improved community mobility.    Baseline 1.84 ft/sec with SPC.    Time 8    Period Weeks    Status New                   Plan - 12/13/20 1036     Clinical Impression Statement Pt with orthostatics during session today limiting ability to participate in standing exercises. Pt's cardiologist is well aware of pt's orthostatics and has follow up in a couple weeks. Performed exercises in seated today for BLE strengthening and added resisted B knee flexion with red tband to patient's HEP. Pt reporting feeling better at end of session. Will check LTGs at next session and change goal dates when pt is able to tolerate standing activity.    Personal Factors and Comorbidities Past/Current Experience;Comorbidity 3+    Comorbidities chronic systolic CHF, CAD s/p CABG and redo, CKD stage III, HLD, a fib, DM    Examination-Activity Limitations Stairs;Transfers;Locomotion Level;Squat    Examination-Participation Restrictions Community Activity;Driving;Yard Work    SMerchant navy officerEvolving/Moderate complexity    Rehab Potential Good    PT Frequency 1x / week    PT Duration 12 weeks   8 visits over 12 weeks   PT Treatment/Interventions ADLs/Self Care Home Management;DME Instruction;Gait training;Stair training;Functional mobility training;Therapeutic activities;Neuromuscular  re-education;Balance training;Therapeutic exercise;Patient/family education;Vestibular    PT Next Visit Plan how is HEP? check goals and change date. check BP. SLS and narrow BOS, functional BLE strength, working on incr foot clearance activities.  work on stepping strategies, gait with rollator.    PT Home Exercise Plan [id number]   Consulted and Agree with Plan of Care Patient;Family member/caregiver    Family Member Consulted spouse, PNevin Bloodgood            Patient will benefit from skilled therapeutic intervention in order to improve the following deficits and impairments:  Abnormal gait, Decreased activity tolerance, Decreased coordination, Decreased balance, Decreased endurance, Decreased strength, Impaired sensation, Postural dysfunction, Decreased knowledge of use of DME  Visit Diagnosis: Unsteadiness on feet  Other abnormalities of gait and mobility  Muscle weakness (generalized)     Problem List Patient Active Problem List   Diagnosis Date Noted   Ventricular fibrillation ([place] 07/23/2020   Syncope and collapse 09/24/2017  GERD (gastroesophageal reflux disease) 09/24/2017   Type 2 diabetes mellitus ([place]) 09/24/2017   Chronic combined systolic and diastolic CHF (congestive heart failure) ([place]) 09/24/2017   Acute renal failure superimposed on stage 3 chronic kidney disease ([place]) 09/24/2017   Syncope    Acute kidney injury ([place]) 05/01/2017   Confusion 05/01/2017   NSTEMI (non-ST elevated myocardial infarction) [facility])    Unstable angina (HCC)    Chest pain with high risk of acute coronary syndrome 08/01/2015   CKD (chronic kidney disease) stage 3, GFR 30-59 ml/min (HCC) 06/14/2015   Angina pectoris ([place]) 02/17/2015   Paroxysmal atrial fibrillation ([place]) 01/11/2015   Hypersomnia [id number]   Chronic systolic CHF (congestive heart failure) ([place]) 12/12/2012   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

## 2020-12-13 NOTE — Patient Instructions (Signed)
Access Code: [email or web address] URL: [email or web address] Date: 12/13/2020 Prepared by: Janann August  Exercises Sit to Stand with Armchair - 1-2 x daily - 5 x weekly - 2 sets - 5 reps Seated Heel Toe Raises - 2 x daily - 5 x weekly - 2 sets - 10 reps Seated Long Arc Quad - 1 x daily - 5 x weekly - 2 sets - 10 reps Standing Single Leg Stance with Counter Support - 2 x daily - 5 x weekly - 3 sets - 10-15 hold Side Stepping with Counter Support - 2 x daily - 5 x weekly - 3 sets Standing with Head Rotation - 1 x daily - 5 x weekly - 2 sets - 5 reps Seated Knee Flexion with Anchored Resistance - 1 x daily - 5 x weekly - 2 sets - 10 reps

## 2020-12-14 NOTE — Progress Notes (Signed)
EPIC Encounter for ICM Monitoring  Patient Name: [name] is a 80 y.o. male Date: 12/14/2020 Primary Care Physican: Mayra Neer, MD Primary Cardiologist: Aundra Dubin Electrophysiologist: Allred Bi-V Pacing:  99.6%    12/14/2020 Weight: 17 lbs (baseline 174 lbs)   Time in AT/AF  0.0 hr/day (0.0%)         Spoke with wife and she reports pt has been working with PT to get stronger.  He had ER visit 8/10 and 8/20 due to fall/weakness.         OptiVol Thoracic impedance suggesting fluid levels returned to normal.   Prescribed:  Furosemide 40 mg Take 2 tablets (80 mg total) every morning.  Take 1 tablet in the evening only if there is fluid build up or shortness of breath. (Decreased on 8/10 during ER visit) Potassium 20 mEq take 2 tablets by mouth daily (Potassium discontinued on 11/24/2020)   Labs: 11/24/2020 Creatinine 2.02, BUN 29, Potassium 3.9, Sodium 136, GFR 33 11/16/2020 Creatinine 1.66, BUN 25, Potassium 4.2, Sodium 136, GFR 42  10/11/2020 Creatinine 1.75, BUN 30, Potassium 4.2, Sodium 138, GFR 39 09/29/2020 Creatinine 1.40, BUN 23, Potassium 4.5, sodium 137, GFR 51 08/24/2020 Creatinine 1.74, BUN 29, Potassium 3.8, Sodium 138, GFR 40 A complete set of results can be found in Results Review.   Recommendations: No changes and encouraged to call if experiencing any fluid symptoms.   Follow-up plan: ICM clinic phone appointment on 01/03/2021.   91 day device clinic remote transmission 02/10/2021.     EP/Cardiology Office Visits:  01/19/2021 with Dr Aundra Dubin.     Copy of ICM check sent to Dr. Rayann Heman.   3 month ICM trend: 12/14/2020.    1 Year ICM trend:       [name], RN 12/14/2020 1:44 PM

## 2020-12-21 ENCOUNTER — Encounter: Payer: Self-pay | Admitting: Physical Therapy

## 2020-12-21 ENCOUNTER — Ambulatory Visit: Payer: Medicare HMO | Attending: Family Medicine | Admitting: Physical Therapy

## 2020-12-21 ENCOUNTER — Other Ambulatory Visit: Payer: Self-pay

## 2020-12-21 VITALS — BP 120/56 | HR 69

## 2020-12-21 DIAGNOSIS — R2689 Other abnormalities of gait and mobility: Secondary | ICD-10-CM | POA: Diagnosis not present

## 2020-12-21 DIAGNOSIS — R2681 Unsteadiness on feet: Secondary | ICD-10-CM | POA: Diagnosis not present

## 2020-12-21 DIAGNOSIS — M6281 Muscle weakness (generalized): Secondary | ICD-10-CM

## 2020-12-21 NOTE — Therapy (Signed)
[facility] [address] [place], [place], [address] Phone: [phone]   Fax:  [phone]  Physical Therapy Treatment  Patient Details  Name: [name] MRN: [id number] Date of Birth: [birth year] Referring Provider (PT): Mayra Neer, MD   Encounter Date: 12/21/2020   PT End of Session - 12/21/20 1025     Visit Number 6    Number of Visits 9    Date for PT Re-Evaluation 01/13/21    Authorization Type [employer]- $35 co pay    PT Start Time 0933    PT Stop Time 1014    PT Time Calculation (min) 41 min    Equipment Utilized During Treatment Gait belt    Activity Tolerance Patient tolerated treatment well    Behavior During Therapy [facility] for tasks assessed/performed             Past Medical History:  Diagnosis Date   CAD (coronary artery disease)    a. s/p CABG 1997 b. PCI (BMS) of SVG to RCA 3/09 c. redo CABG 02/2011 with SVG to PDA, SVG to Lcx d. Cath 07/2013: ischemic cardiomyopathy with LVEF less than 20%, occlusion of the SVG's placed in 2012 e.stenting of LM in 07/2015 with PCI of LCx performed as well   CHF (congestive heart failure) (HCC)    Chronic systolic dysfunction of left ventricle    EF 30%   Depression    DJD (degenerative joint disease)    DM (diabetes mellitus) ([place]) 02/14/2011   GERD (gastroesophageal reflux disease)    Gout    HTN (hypertension) 02/14/2011   Hyperlipemia    Ischemic cardiomyopathy    s/p ICD Implantation by Dr Leonia Reeves   Paroxysmal atrial fibrillation ([place]) 10/22/2014   single episode of AF x 3 hours 48 minutes recorded on ICD, [id number] score is at least 5    Renal disorder    Ventricular tachycardia ([place]) 07/23/2020   appropriate ICD therapy delivered for VT at 222 bpm    Past Surgical History:  Procedure Laterality Date   BI-VENTRICULAR IMPLANTABLE CARDIOVERTER DEFIBRILLATOR UPGRADE N/A 08/24/2011   Procedure: BI-VENTRICULAR IMPLANTABLE CARDIOVERTER  DEFIBRILLATOR UPGRADE;  Surgeon: Evans Lance, MD;  Location: [facility] CATH LAB;  Service: Cardiovascular;  Laterality: N/A;   BIV ICD GENERATOR CHANGEOUT N/A 02/13/2018   Procedure: BIV ICD GENERATOR CHANGEOUT;  Surgeon: Thompson Grayer, MD;  Location: [place] CV LAB;  Service: Cardiovascular;  Laterality: N/A;   CARDIAC CATHETERIZATION  08/03/2015   Procedure: Left Heart Cath and Cors/Grafts Angiography;  Surgeon: Peter M Martinique, MD;  Location: [place] CV LAB;  Service: Cardiovascular;;   CARDIAC CATHETERIZATION N/A 08/06/2015   Procedure: Coronary Stent Intervention w/Impella;  Surgeon: Jettie Booze, MD;  Location: [place] CV LAB;  Service: Cardiovascular;  Laterality: N/A;   CARDIAC CATHETERIZATION  08/06/2015   Procedure: Left Heart Cath;  Surgeon: Jettie Booze, MD;  Location: [place] CV LAB;  Service: Cardiovascular;;   CARDIAC CATHETERIZATION  08/06/2015   Procedure: Coronary Balloon Angioplasty;  Surgeon: Jettie Booze, MD;  Location: [place] CV LAB;  Service: Cardiovascular;;   CARDIAC DEFIBRILLATOR PLACEMENT  08/2004   initial placement, upgraded to [employer] ICD by Dr Lovena Le 08/24/11 (MDT)   CARDIOVERSION N/A 09/26/2019   Procedure: CARDIOVERSION;  Surgeon: Larey Dresser, MD;  Location: [facility] ENDOSCOPY;  Service: Cardiovascular;  Laterality: N/A;   CATARACT EXTRACTION W/ INTRAOCULAR LENS  IMPLANT, BILATERAL  2012   CHOLECYSTECTOMY  01/05/2012   Procedure:  LAPAROSCOPIC CHOLECYSTECTOMY WITH INTRAOPERATIVE CHOLANGIOGRAM;  Surgeon: Joyice Faster. Cornett, MD;  Location: [place];  Service: General;  Laterality: N/A;  laparoscopic cholecysectoym with intraoperative cholangiogram   COLONOSCOPY WITH PROPOFOL N/A 11/18/2013   Procedure: COLONOSCOPY WITH PROPOFOL;  Surgeon: Garlan Fair, MD;  Location: [facility] ENDOSCOPY;  Service: Endoscopy;  Laterality: N/A;   CORONARY ANGIOPLASTY WITH STENT PLACEMENT  06/2007   BMS to SVG to RCA   CORONARY ARTERY BYPASS GRAFT  01/1996   CABG x 5  LIMA to LAD SVG to diag1,2,svg to om,svg to RCA   CORONARY ARTERY BYPASS GRAFT  03/06/2011   CABG X2; Procedure: REDO CORONARY ARTERY BYPASS GRAFTING (CABG);  Surgeon: Grace Isaac, MD;  Location: [place];  Service: Open Heart Surgery;  Laterality: N/A;  times two grafts using right saphenous vein harvested endoscopically.   KNEE ARTHROTOMY  ~ 1978   RIGHT KNEE CARTILAGE REMOVED   LEFT HEART CATHETERIZATION WITH CORONARY ANGIOGRAM N/A 06/06/2011   Procedure: LEFT HEART CATHETERIZATION WITH CORONARY ANGIOGRAM;  Surgeon: Sueanne Margarita, MD;  Location: [place] CATH LAB;  Service: Cardiovascular;  Laterality: N/A;   LEFT HEART CATHETERIZATION WITH CORONARY/GRAFT ANGIOGRAM N/A 08/08/2013   Procedure: LEFT HEART CATHETERIZATION WITH Beatrix Fetters;  Surgeon: Sinclair Grooms, MD;  Location: [facility] CATH LAB;  Service: Cardiovascular;  Laterality: N/A;   LUMBAR DISC SURGERY  2003   RIGHT HEART CATHETERIZATION N/A 02/17/2014   Procedure: RIGHT HEART CATH;  Surgeon: Larey Dresser, MD;  Location: [facility] CATH LAB;  Service: Cardiovascular;  Laterality: N/A;   RIGHT/LEFT HEART CATH AND CORONARY/GRAFT ANGIOGRAPHY N/A 07/26/2020   Procedure: RIGHT/LEFT HEART CATH AND CORONARY/GRAFT ANGIOGRAPHY;  Surgeon: Larey Dresser, MD;  Location: [place] CV LAB;  Service: Cardiovascular;  Laterality: N/A;   TEE WITHOUT CARDIOVERSION N/A 09/26/2019   Procedure: TRANSESOPHAGEAL ECHOCARDIOGRAM (TEE);  Surgeon: Larey Dresser, MD;  Location: [facility] ENDOSCOPY;  Service: Cardiovascular;  Laterality: N/A;   VENOGRAM N/A 06/08/2011   Procedure: VENOGRAM;  Surgeon: Thompson Grayer, MD;  Location: [facility] CATH LAB;  Service: Cardiovascular;  Laterality: N/A;    Vitals:   12/21/20 0940  BP: (!) 120/56  Pulse: 69     Subjective Assessment - 12/21/20 0936     Subjective Didn't sleep well last night. Has been doing his exercises. No falls.    Patient is accompained by: Family member   wife   Pertinent History PMH: chronic systolic  CHF, CAD s/p CABG and redo, CKD stage III, HLD, a fib, DM, implantable cardioverter-defibrillator    Limitations Walking;House hold activities;Standing    Patient Stated Goals improve the balance - does not want to use a cane.    Currently in Pain? Yes    Pain Score 5     Pain Location Knee    Pain Orientation Right;Left    Pain Descriptors / Indicators Sore    Pain Type Acute pain    Aggravating Factors  nothing really    Pain Relieving Factors medicine or braces                               [facility] Adult PT Treatment/Exercise - 12/21/20 0953       Transfers   Transfers Sit to Stand;Stand to Sit    Sit to Stand 5: Supervision;With upper extremity assist    Five time sit to stand comments  30.41 seconds with BUE support    Stand to Sit 5:  Supervision      Ambulation/Gait   Ambulation/Gait Yes    Ambulation/Gait Assistance 5: Supervision    Ambulation/Gait Assistance Details needs cues throughout for tall posture/looking ahead and keeping rollator with him when turning to sit back down to mat table, cued to lock brakes before sitting    Ambulation Distance (Feet) --   clinic distances   Assistive device Rollator    Gait Pattern Step-through pattern;Decreased step length - right;Decreased step length - left;Decreased arm swing - left;Decreased hip/knee flexion - right;Decreased hip/knee flexion - left;Trunk flexed    Ambulation Surface Level;Indoor    Gait velocity 20 seconds with rollator = 1.64 ft/sec                 Balance Exercises - 12/21/20 0953       Balance Exercises: Standing   Standing Eyes Opened Narrow base of support (BOS)    Standing Eyes Opened Limitations on level ground: feet together, alternating UE lifts x5 reps, with blue air ex: 3  x 20 seconds static balance with intermittent taps to bars, cues for posture throughout and looking forward into mirror    Standing Eyes Closed Wide (BOA);Solid surface    Standing Eyes Closed  Limitations beginning with more wide BOS to more narrow BOS on level ground 5 x 20 seconds with intermittent UE support and min guard for balance    SLS with Vectors Solid surface;Limitations    SLS with Vectors Limitations alternating foot taps with UE support > without UE support x10 reps B to 4" step    Other Standing Exercises standing on level ground with wide BOS x5 reps trunk rotations across body to tap cone as target               PT Education - 12/21/20 1023     Education Details provided referral sheet for four wheeled walker (rollator) for pt to take to his PCP appt next week for physician to sign and then can take it to a medical supply store    Person(s) Educated Patient    Methods Explanation;Handout    Comprehension Verbalized understanding              PT Short Term Goals - 11/22/20 1219       PT SHORT TERM GOAL #1   Title Pt will finish assessment of BERG with LTG written as appropriate. ALL STGS DUE 11/13/20    Baseline 32/56 on7/25/22    Time 4    Period Weeks    Status Achieved    Target Date 12/14/20      PT SHORT TERM GOAL #2   Title Pt and pt's spouse will verbalize understanding of fall prevention in the home.    Baseline provided handout on 11/15/20 and discussed with pt and pt's spouse    Time 4    Period Weeks    Status Achieved      PT SHORT TERM GOAL #3   Title Pt will decr 5x sit <> stand time to 23 seconds or less with BUE support in order to demo improved functional BLE strength for transfers and decr fall risk.    Baseline 22.4 seconds with BUE support from mat table on 11/22/20    Time 4    Period Weeks    Status Achieved      PT SHORT TERM GOAL #4   Title Pt will improve gait speed to at least 2.0 ft/sec with LRAD in order to demo improved  community mobility.    Baseline 1.84 ft/sec    Time 4    Period Weeks    Status New               PT Long Term Goals - 12/21/20 0945       PT LONG TERM GOAL #1   Title Pt will be  independent with final HEP with wife's supervision in order to build upon functional gains made in therapy. ALL LTGS DUE 12/14/20    Baseline pt and pt's spouse reports performing at home, will benefit from ongoing additions/review    Time 8    Period Weeks    Status Partially Met      PT LONG TERM GOAL #2   Title Pt will improve BERG to at least a 39/56 in order to demo decr fall risk.    Baseline 32/56    Time 8    Period Weeks    Status Revised      PT LONG TERM GOAL #3   Title Pt will decr 5x sit <> stand time to 18 seconds or less  with BUE support in order to demo decr fall risk.    Baseline 30.41 seconds on 12/21/20 with BUE support from standard height chair    Time 8    Period Weeks    Status Not Met      PT LONG TERM GOAL #4   Title Pt will recover posterior and anterior balance in push and release test in 2 or less robust steps independently, for improved balance recovery    Baseline anterior = unable to ellicit, posterior = 2 delayed small steps.    Time 8    Period Weeks    Status New      PT LONG TERM GOAL #5   Title Pt will improve gait speed to at least 2.6f/sec with LRAD in order to demo improved community mobility.    Baseline 1.84 ft/sec with SPC; 1.64 ft/sec with rollator on 12/21/20    Time 8    Period Weeks    Status Not Met              Updated/ongoing LTGs:    PT Long Term Goals - 12/21/20 1038       PT LONG TERM GOAL #1   Title Pt will be independent with final HEP with wife's supervision in order to build upon functional gains made in therapy. ALL LTGS DUE 01/13/21    Baseline pt and pt's spouse reports performing at home, will benefit from ongoing additions/review    Time 12    Period Weeks    Status On-going      PT LONG TERM GOAL #2   Title Pt will improve BERG to at least a 39/56 in order to demo decr fall risk.    Baseline 32/56    Time 12    Period Weeks    Status On-going      PT LONG TERM GOAL #3   Title Pt will decr 5x sit <>  stand time to 25 seconds or less  with BUE support in order to demo decr fall risk.    Baseline 30.41 seconds on 12/21/20 with BUE support from standard height chair    Time 12    Period Weeks    Status Revised      PT LONG TERM GOAL #4   Title Pt will recover posterior and anterior balance in push and release test in 2 or  less robust steps independently, for improved balance recovery    Baseline anterior = unable to ellicit, posterior = 2 delayed small steps.    Time 12    Period Weeks    Status On-going      PT LONG TERM GOAL #5   Title Pt will improve gait speed to at least 1.9 ft/sec with rollator in order to demo improved community mobility and to decr fall risk.    Baseline 1.84 ft/sec with SPC; 1.64 ft/sec with rollator on 12/21/20    Time 12    Period Weeks    Status Revised                Plan - 12/21/20 1035     Clinical Impression Statement Began to check LTGs today. Pt did not meet LTG #3 and #5 in regards to 5x sit <> stand and gait speed. Pt performed 5x sit <> stand from standard chair in 30.41 seconds with BUE support (previously 26.38 seconds). Pt's gait speed with rollator was 1.64 ft/sec (previously 1.84 ft/sec with SPC at eval), demonstrating an incr risk for falls. Remainder of session focused on standing balance strategies with decr UE support, with pt most challenged by static balance on blue air ex and balance with eyes closed  on level ground, needing intermittent UE support. pt needing cues throughout session for posture and looking ahead. Will continue to progress towards LTGs.    Personal Factors and Comorbidities Past/Current Experience;Comorbidity 3+    Comorbidities chronic systolic CHF, CAD s/p CABG and redo, CKD stage III, HLD, a fib, DM    Examination-Activity Limitations Stairs;Transfers;Locomotion Level;Squat    Examination-Participation Restrictions Community Activity;Driving;Yard Work    [employer] Evolving/Moderate  complexity    Rehab Potential Good    PT Frequency 1x / week    PT Duration 12 weeks   8 visits over 12 weeks   PT Treatment/Interventions ADLs/Self Care Home Management;DME Instruction;Gait training;Stair training;Functional mobility training;Therapeutic activities;Neuromuscular re-education;Balance training;Therapeutic exercise;Patient/family education;Vestibular    PT Next Visit Plan did pt get order for rollator? check BP. SLS and narrow BOS, functional BLE strength, working on incr foot clearance activities.  work on stepping strategies, gait with rollator.    PT Home Exercise Plan [email or web address]    Consulted and Agree with Plan of Care Patient;Family member/caregiver    Family Member Consulted spouse, [name]             Patient will benefit from skilled therapeutic intervention in order to improve the following deficits and impairments:  Abnormal gait, Decreased activity tolerance, Decreased coordination, Decreased balance, Decreased endurance, Decreased strength, Impaired sensation, Postural dysfunction, Decreased knowledge of use of DME  Visit Diagnosis: Unsteadiness on feet  Muscle weakness (generalized)  Other abnormalities of gait and mobility     Problem List Patient Active Problem List   Diagnosis Date Noted   Ventricular fibrillation ([place]) 07/23/2020   Syncope and collapse 09/24/2017   GERD (gastroesophageal reflux disease) 09/24/2017   Type 2 diabetes mellitus ([place]) 09/24/2017   Chronic combined systolic and diastolic CHF (congestive heart failure) ([place]) 09/24/2017   Acute renal failure superimposed on stage 3 chronic kidney disease ([place]) 09/24/2017   Syncope    Acute kidney injury ([place]) 05/01/2017   Confusion 05/01/2017   NSTEMI (non-ST elevated myocardial infarction) ([place])    Unstable angina (HCC)    Chest pain with high risk of acute coronary syndrome 08/01/2015   CKD (chronic kidney disease) stage 3, GFR 30-59 ml/min ([place]) 06/14/2015  Angina pectoris ([place])  02/17/2015   Paroxysmal atrial fibrillation ([place]) 01/11/2015   Hypersomnia 16/60/6004   Chronic systolic CHF (congestive heart failure) ([place]) 12/12/2012   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 [place], [place], [address] Phone: [phone]   Fax:  [phone]  Name: VU LIEBMAN MRN: [id number] Date of Birth: 25-Jun-1940

## 2020-12-27 ENCOUNTER — Encounter: Payer: Self-pay | Admitting: Physical Therapy

## 2020-12-27 ENCOUNTER — Other Ambulatory Visit: Payer: Self-pay

## 2020-12-27 ENCOUNTER — Ambulatory Visit: Payer: Medicare HMO | Admitting: Physical Therapy

## 2020-12-27 VITALS — BP 119/72 | HR 69

## 2020-12-27 DIAGNOSIS — M6281 Muscle weakness (generalized): Secondary | ICD-10-CM | POA: Diagnosis not present

## 2020-12-27 DIAGNOSIS — M150 Primary generalized (osteo)arthritis: Secondary | ICD-10-CM | POA: Diagnosis not present

## 2020-12-27 DIAGNOSIS — E7800 Pure hypercholesterolemia, unspecified: Secondary | ICD-10-CM | POA: Diagnosis not present

## 2020-12-27 DIAGNOSIS — R2681 Unsteadiness on feet: Secondary | ICD-10-CM | POA: Diagnosis not present

## 2020-12-27 DIAGNOSIS — I209 Angina pectoris, unspecified: Secondary | ICD-10-CM | POA: Diagnosis not present

## 2020-12-27 DIAGNOSIS — E1165 Type 2 diabetes mellitus with hyperglycemia: Secondary | ICD-10-CM | POA: Diagnosis not present

## 2020-12-27 DIAGNOSIS — I5022 Chronic systolic (congestive) heart failure: Secondary | ICD-10-CM | POA: Diagnosis not present

## 2020-12-27 DIAGNOSIS — I255 Ischemic cardiomyopathy: Secondary | ICD-10-CM | POA: Diagnosis not present

## 2020-12-27 DIAGNOSIS — Z23 Encounter for immunization: Secondary | ICD-10-CM | POA: Diagnosis not present

## 2020-12-27 DIAGNOSIS — I25119 Atherosclerotic heart disease of native coronary artery with unspecified angina pectoris: Secondary | ICD-10-CM | POA: Diagnosis not present

## 2020-12-27 DIAGNOSIS — R2689 Other abnormalities of gait and mobility: Secondary | ICD-10-CM

## 2020-12-27 DIAGNOSIS — Z0000 Encounter for general adult medical examination without abnormal findings: Secondary | ICD-10-CM | POA: Diagnosis not present

## 2020-12-27 DIAGNOSIS — R69 Illness, unspecified: Secondary | ICD-10-CM | POA: Diagnosis not present

## 2020-12-27 DIAGNOSIS — I4891 Unspecified atrial fibrillation: Secondary | ICD-10-CM | POA: Diagnosis not present

## 2020-12-27 NOTE — Therapy (Signed)
[facility] [address] [place], [place], [address] Phone: [phone]   Fax:  [phone]  Physical Therapy Treatment  Patient Details  Name: [name] MRN: [id number] Date of Birth: 08/30/1940 Referring Provider (PT): Mayra Neer, MD   Encounter Date: 12/27/2020   PT End of Session - 12/27/20 1110     Visit Number 7    Number of Visits 9    Date for PT Re-Evaluation 01/13/21    Authorization Type [employer]- $35 co pay    PT Start Time 1018    PT Stop Time 1058    PT Time Calculation (min) 40 min    Equipment Utilized During Treatment Gait belt    Activity Tolerance Patient tolerated treatment well    Behavior During Therapy [facility] for tasks assessed/performed             Past Medical History:  Diagnosis Date   CAD (coronary artery disease)    a. s/p CABG 1997 b. PCI (BMS) of SVG to RCA 3/09 c. redo CABG 02/2011 with SVG to PDA, SVG to Lcx d. Cath 07/2013: ischemic cardiomyopathy with LVEF less than 20%, occlusion of the SVG's placed in 2012 e.stenting of LM in 07/2015 with PCI of LCx performed as well   CHF (congestive heart failure) (HCC)    Chronic systolic dysfunction of left ventricle    EF 30%   Depression    DJD (degenerative joint disease)    DM (diabetes mellitus) ([place]) 02/14/2011   GERD (gastroesophageal reflux disease)    Gout    HTN (hypertension) 02/14/2011   Hyperlipemia    Ischemic cardiomyopathy    s/p ICD Implantation by Dr Leonia Reeves   Paroxysmal atrial fibrillation ([place]) 10/22/2014   single episode of AF x 3 hours 48 minutes recorded on ICD, [email or web address] score is at least 5    Renal disorder    Ventricular tachycardia ([place]) 07/23/2020   appropriate ICD therapy delivered for VT at 222 bpm    Past Surgical History:  Procedure Laterality Date   BI-VENTRICULAR IMPLANTABLE CARDIOVERTER DEFIBRILLATOR UPGRADE N/A 08/24/2011   Procedure: BI-VENTRICULAR IMPLANTABLE CARDIOVERTER  DEFIBRILLATOR UPGRADE;  Surgeon: Evans Lance, MD;  Location: [facility] CATH LAB;  Service: Cardiovascular;  Laterality: N/A;   BIV ICD GENERATOR CHANGEOUT N/A 02/13/2018   Procedure: BIV ICD GENERATOR CHANGEOUT;  Surgeon: Thompson Grayer, MD;  Location: [place] CV LAB;  Service: Cardiovascular;  Laterality: N/A;   CARDIAC CATHETERIZATION  08/03/2015   Procedure: Left Heart Cath and Cors/Grafts Angiography;  Surgeon: Peter M Martinique, MD;  Location: [place] CV LAB;  Service: Cardiovascular;;   CARDIAC CATHETERIZATION N/A 08/06/2015   Procedure: Coronary Stent Intervention w/Impella;  Surgeon: Jettie Booze, MD;  Location: [place] CV LAB;  Service: Cardiovascular;  Laterality: N/A;   CARDIAC CATHETERIZATION  08/06/2015   Procedure: Left Heart Cath;  Surgeon: Jettie Booze, MD;  Location: [place] CV LAB;  Service: Cardiovascular;;   CARDIAC CATHETERIZATION  08/06/2015   Procedure: Coronary Balloon Angioplasty;  Surgeon: Jettie Booze, MD;  Location: [place] CV LAB;  Service: Cardiovascular;;   CARDIAC DEFIBRILLATOR PLACEMENT  08/2004   initial placement, upgraded to [place] ICD by Dr Lovena Le 08/24/11 (MDT)   CARDIOVERSION N/A 09/26/2019   Procedure: CARDIOVERSION;  Surgeon: Larey Dresser, MD;  Location: [facility] ENDOSCOPY;  Service: Cardiovascular;  Laterality: N/A;   CATARACT EXTRACTION W/ INTRAOCULAR LENS  IMPLANT, BILATERAL  2012   CHOLECYSTECTOMY  01/05/2012   Procedure:  LAPAROSCOPIC CHOLECYSTECTOMY WITH INTRAOPERATIVE CHOLANGIOGRAM;  Surgeon: Joyice Faster. Cornett, MD;  Location: [facility];  Service: General;  Laterality: N/A;  laparoscopic cholecysectoym with intraoperative cholangiogram   COLONOSCOPY WITH PROPOFOL N/A 11/18/2013   Procedure: COLONOSCOPY WITH PROPOFOL;  Surgeon: Garlan Fair, MD;  Location: [facility] ENDOSCOPY;  Service: Endoscopy;  Laterality: N/A;   CORONARY ANGIOPLASTY WITH STENT PLACEMENT  06/2007   BMS to SVG to RCA   CORONARY ARTERY BYPASS GRAFT  01/1996   CABG x 5  LIMA to LAD SVG to diag1,2,svg to om,svg to RCA   CORONARY ARTERY BYPASS GRAFT  03/06/2011   CABG X2; Procedure: REDO CORONARY ARTERY BYPASS GRAFTING (CABG);  Surgeon: Grace Isaac, MD;  Location: [place];  Service: Open Heart Surgery;  Laterality: N/A;  times two grafts using right saphenous vein harvested endoscopically.   KNEE ARTHROTOMY  ~ 1978   RIGHT KNEE CARTILAGE REMOVED   LEFT HEART CATHETERIZATION WITH CORONARY ANGIOGRAM N/A 06/06/2011   Procedure: LEFT HEART CATHETERIZATION WITH CORONARY ANGIOGRAM;  Surgeon: Sueanne Margarita, MD;  Location: [facility] CATH LAB;  Service: Cardiovascular;  Laterality: N/A;   LEFT HEART CATHETERIZATION WITH CORONARY/GRAFT ANGIOGRAM N/A 08/08/2013   Procedure: LEFT HEART CATHETERIZATION WITH Beatrix Fetters;  Surgeon: Sinclair Grooms, MD;  Location: [facility] CATH LAB;  Service: Cardiovascular;  Laterality: N/A;   LUMBAR DISC SURGERY  2003   RIGHT HEART CATHETERIZATION N/A 02/17/2014   Procedure: RIGHT HEART CATH;  Surgeon: Larey Dresser, MD;  Location: [facility] CATH LAB;  Service: Cardiovascular;  Laterality: N/A;   RIGHT/LEFT HEART CATH AND CORONARY/GRAFT ANGIOGRAPHY N/A 07/26/2020   Procedure: RIGHT/LEFT HEART CATH AND CORONARY/GRAFT ANGIOGRAPHY;  Surgeon: Larey Dresser, MD;  Location: [place] CV LAB;  Service: Cardiovascular;  Laterality: N/A;   TEE WITHOUT CARDIOVERSION N/A 09/26/2019   Procedure: TRANSESOPHAGEAL ECHOCARDIOGRAM (TEE);  Surgeon: Larey Dresser, MD;  Location: [facility] ENDOSCOPY;  Service: Cardiovascular;  Laterality: N/A;   VENOGRAM N/A 06/08/2011   Procedure: VENOGRAM;  Surgeon: Thompson Grayer, MD;  Location: [facility] CATH LAB;  Service: Cardiovascular;  Laterality: N/A;    Vitals:   12/27/20 1023  BP: 119/72  Pulse: 69     Subjective Assessment - 12/27/20 1021     Subjective Did a lot of walking over the weekend. Knees are hurting a little bit today. Exercises still going well at home. No falls.  Goes to his PCP later today and his neurologist  on Thursday.    Patient is accompained by: Family member   wife   Pertinent History PMH: chronic systolic CHF, CAD s/p CABG and redo, CKD stage III, HLD, a fib, DM, implantable cardioverter-defibrillator    Limitations Walking;House hold activities;Standing    Patient Stated Goals improve the balance - does not want to use a cane.    Currently in Pain? Yes    Pain Score 5     Pain Location Knee    Pain Orientation Right;Left    Pain Descriptors / Indicators Sore    Pain Type Chronic pain    Aggravating Factors  walking    Pain Relieving Factors arthritis cream                               [facility] Adult PT Treatment/Exercise - 12/27/20 1027       Transfers   Transfers Sit to Stand;Stand to Sit    Sit to Stand 5: Supervision;With upper extremity assist  Sit to Stand Details (indicate cue type and reason) needs cues for proper UE placement prior to sit <> stand    Stand to Sit 5: Supervision    Comments TUG shuttle x4 reps with chair set up 15' away, walking with rollator and turning with rollator and backing up BLE to sit vs. leaving rollator behind, needs cues for sequencing and brake management      Therapeutic Activites    Therapeutic Activities Other Therapeutic Activities    Other Therapeutic Activities attempted seated peddle bike after pt reporting feeling decr pain in bilat knees after scifit, did not work for pt with getting his feet in the peddles. pt's wife reports that they have silver sneakers at the [facility] in [place] that they are planning on going back to      Knee/Hip Exercises: Aerobic   Stepper SciFit Stepper for 5 minutes at gear 1.5 with BUE/BLE for strengthening, ROM, and activity tolerance                 Balance Exercises - 12/27/20 1056       Balance Exercises: Standing   Standing Eyes Closed Wide (BOA);Solid surface    Standing Eyes Closed Limitations feet hip width distance 2 x 30 seconds, no UE support needed today    SLS  with Vectors Solid surface;Limitations    SLS with Vectors Limitations alternating SLS taps to colorful floor bubbles x10 reps bilat, needing UE support as needed for balance    Stepping Strategy Anterior;UE support;10 reps;Limitations    Stepping Strategy Limitations over 4" foam beam, 10 reps bilat, with cues for foot clearance and weight shift    Retro Gait 3 reps;Limitations    Retro Gait Limitations in // bars with BUE support, cues for step length    Marching Solid surface;Upper extremity assist 2;Forwards    Marching Limitations in // bars, cues for posture and looking up in mirror                  PT Short Term Goals - 11/22/20 1219       PT SHORT TERM GOAL #1   Title Pt will finish assessment of BERG with LTG written as appropriate. ALL STGS DUE 11/13/20    Baseline 32/56 on7/25/22    Time 4    Period Weeks    Status Achieved    Target Date 12/14/20      PT SHORT TERM GOAL #2   Title Pt and pt's spouse will verbalize understanding of fall prevention in the home.    Baseline provided handout on 11/15/20 and discussed with pt and pt's spouse    Time 4    Period Weeks    Status Achieved      PT SHORT TERM GOAL #3   Title Pt will decr 5x sit <> stand time to 23 seconds or less with BUE support in order to demo improved functional BLE strength for transfers and decr fall risk.    Baseline 22.4 seconds with BUE support from mat table on 11/22/20    Time 4    Period Weeks    Status Achieved      PT SHORT TERM GOAL #4   Title Pt will improve gait speed to at least 2.0 ft/sec with LRAD in order to demo improved community mobility.    Baseline 1.84 ft/sec    Time 4    Period Weeks    Status New  PT Long Term Goals - 12/21/20 1038       PT LONG TERM GOAL #1   Title Pt will be independent with final HEP with wife's supervision in order to build upon functional gains made in therapy. ALL LTGS DUE 01/13/21    Baseline pt and pt's spouse reports  performing at home, will benefit from ongoing additions/review    Time 12    Period Weeks    Status On-going      PT LONG TERM GOAL #2   Title Pt will improve BERG to at least a 39/56 in order to demo decr fall risk.    Baseline 32/56    Time 12    Period Weeks    Status On-going      PT LONG TERM GOAL #3   Title Pt will decr 5x sit <> stand time to 25 seconds or less  with BUE support in order to demo decr fall risk.    Baseline 30.41 seconds on 12/21/20 with BUE support from standard height chair    Time 12    Period Weeks    Status Revised      PT LONG TERM GOAL #4   Title Pt will recover posterior and anterior balance in push and release test in 2 or less robust steps independently, for improved balance recovery    Baseline anterior = unable to ellicit, posterior = 2 delayed small steps.    Time 12    Period Weeks    Status On-going      PT LONG TERM GOAL #5   Title Pt will improve gait speed to at least 1.9 ft/sec with rollator in order to demo improved community mobility and to decr fall risk.    Baseline 1.84 ft/sec with SPC; 1.64 ft/sec with rollator on 12/21/20    Time 12    Period Weeks    Status Revised                   Plan - 12/27/20 1111     Clinical Impression Statement Initiated the SciFit stepper today with pt tolerating well and pt reporting that it decr his bilat knee pain. Pt and pt's spouse have silver sneakers at the [facility] that they are planning on restarting. Worked on the TUG shuttle today with sit <> stand training with BUE support and placement and working on turning with rollator and feeling BLE at chair before sitting, pt improved with incr practice. Pt to have initial visit with neurologist later this week. will continue to progress towards LTGs.    Personal Factors and Comorbidities Past/Current Experience;Comorbidity 3+    Comorbidities chronic systolic CHF, CAD s/p CABG and redo, CKD stage III, HLD, a fib, DM    Examination-Activity  Limitations Stairs;Transfers;Locomotion Level;Squat    Examination-Participation Restrictions Community Activity;Driving;Yard Work    Merchant navy officer Evolving/Moderate complexity    Rehab Potential Good    PT Frequency 1x / week    PT Duration 12 weeks   8 visits over 12 weeks   PT Treatment/Interventions ADLs/Self Care Home Management;DME Instruction;Gait training;Stair training;Functional mobility training;Therapeutic activities;Neuromuscular re-education;Balance training;Therapeutic exercise;Patient/family education;Vestibular    PT Next Visit Plan did pt get order for rollator? how did neurologist go? check BP. SLS and narrow BOS, functional BLE strength, working on incr foot clearance activities.  work on Patent examiner, Insurance underwriter    PT [place] and Agree with Plan of Care Patient;Family member/caregiver  Family Member Consulted spouse, [name]             Patient will benefit from skilled therapeutic intervention in order to improve the following deficits and impairments:  Abnormal gait, Decreased activity tolerance, Decreased coordination, Decreased balance, Decreased endurance, Decreased strength, Impaired sensation, Postural dysfunction, Decreased knowledge of use of DME  Visit Diagnosis: Muscle weakness (generalized)  Unsteadiness on feet  Other abnormalities of gait and mobility     Problem List Patient Active Problem List   Diagnosis Date Noted   Ventricular fibrillation ([place]) 07/23/2020   Syncope and collapse 09/24/2017   GERD (gastroesophageal reflux disease) 09/24/2017   Type 2 diabetes mellitus ([place]) 09/24/2017   Chronic combined systolic and diastolic CHF (congestive heart failure) ([place]) 09/24/2017   Acute renal failure superimposed on stage 3 chronic kidney disease ([place]) 09/24/2017   Syncope    Acute kidney injury ([place]) 05/01/2017   Confusion 05/01/2017   NSTEMI (non-ST elevated myocardial infarction)  ([place])    Unstable angina ([place])    Chest pain with high risk of acute coronary syndrome 08/01/2015   CKD (chronic kidney disease) stage 3, GFR 30-59 ml/min (HCC) 06/14/2015   Angina pectoris ([place]) 02/17/2015   Paroxysmal atrial fibrillation ([place]) 01/11/2015   Hypersomnia [id number]   Chronic systolic CHF (congestive heart failure) ([place]) 12/12/2012   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 [place], [place], [address] Phone: [phone]   Fax:  [phone]  Name: [name] MRN: [id number] Date of Birth: 1941-02-05

## 2020-12-28 ENCOUNTER — Encounter: Payer: Self-pay | Admitting: *Deleted

## 2020-12-29 ENCOUNTER — Ambulatory Visit: Payer: Medicare HMO | Admitting: Neurology

## 2020-12-29 ENCOUNTER — Other Ambulatory Visit: Payer: Self-pay

## 2020-12-29 ENCOUNTER — Encounter: Payer: Self-pay | Admitting: Neurology

## 2020-12-29 VITALS — BP 147/74 | HR 67

## 2020-12-29 DIAGNOSIS — R5381 Other malaise: Secondary | ICD-10-CM

## 2020-12-29 DIAGNOSIS — R296 Repeated falls: Secondary | ICD-10-CM | POA: Diagnosis not present

## 2020-12-29 DIAGNOSIS — R2689 Other abnormalities of gait and mobility: Secondary | ICD-10-CM | POA: Diagnosis not present

## 2020-12-29 DIAGNOSIS — I951 Orthostatic hypotension: Secondary | ICD-10-CM | POA: Diagnosis not present

## 2020-12-29 NOTE — Patient Instructions (Addendum)
I believe you have a multifactorial gait disorder, meaning, that it is [place] is due to a combination of factors. These factors include: Having fallen multiple times before, weakness from deconditioning, blood pressure fluctuation with low blood pressure when you stand up quickly, arthritis, and atherosclerosis (ie hardening of the arteries) of the blood vessels in your brain.  Please remember to stand up slowly and get your bearings first turn slowly, no bending down to pick anything, no heavy lifting, be extra careful at night and first thing in the morning. Also, be careful in the Bathroom and the kitchen.   Remember to drink plenty of fluid, eat healthy meals and do not skip any meals. Try to eat protein with a every meal and eat a healthy snack such as fruit or nuts or yogurt in between meals. Try to keep a regular sleep-wake schedule and try to exercise daily, particularly in the form of walking, 20-30 minutes a day, if you can. You should consider using your walker at all times.  I am not sure that you are completely safe to use a cane only.    I do not see any signs of Parkinson's disease on your exam.  As far as your medications are concerned, I would like to suggest no new medications.   Please discuss with Dr. Brigitte Pulse the possibility of reducing your Seroquel, certain medications can affect your balance which may include your Seroquel.  As far as diagnostic testing: I do not think we need to add any additional test at this time, you have had multiple brain scans in the recent past extensive blood work.  Please follow-up with your primary care physician and your other specialists as well as your therapist.  I would be happy to see him back in this clinic as needed.

## 2020-12-29 NOTE — Progress Notes (Signed)
Subjective:    Patient ID: [name] is a 80 y.o. male.  HPI    Star Age, MD, PhD [facility] [address] [place],  [address]  Dear Dr. Brigitte Pulse,   I saw your patient, [name], upon your kind request in my neurologic clinic today for initial consultation of his gait disorder, concern for parkinsonism.  The patient is accompanied by his wife today. As you know, [name] is a 80 year old right-handed gentleman with an underlying complex medical history of chronic kidney disease, coronary artery disease with status post coronary artery bypass graft, congestive heart failure, ischemic cardiomyopathy, paroxysmal A. Fib on eliquis, diabetes, reflux disease, hypertension, orthostatic hypotension, on midodrine, hyperlipidemia, gout, ventricular tachycardia, with status post ICD placement, depression, insomnia, hearing loss, chronic tinnitus, who reports difficulty with his balance and walking for years.  His wife has noticed some shuffling while he is walking, he is currently in neuro rehab and physical therapy and is supposed to also start using a stationary bike for exercise.  He has a longstanding history of hearing loss, has hearing aids but does not like to use them because they make his tinnitus worse.  He has had tinnitus in the right ear since he was about 80 years old.  I reviewed your office note from 10/13/2020.  He has had significant orthostatic symptoms.  He was referred to physical therapy due to frequent falls.  Of note, he is on multiple medications.  He is on potentially sedating medications including quetiapine 100 mg strength half a pill to 1 pill at bedtime.  He reports that he sometimes takes a whole pill to help him sleep at night.  Per wife, he has been on it for about 2 years.  He is also on venlafaxine long-acting 150 mg once daily.   He had blood work through your office on 08/27/2020 and I was able to review the results:  Hemoglobin A1c was 6.8.    He had blood work in August 2021 which showed an elevated A1c of 7.8.  His BMP from 05/05/2020 showed a glucose level of 152, creatinine 1.43, sodium 143, potassium 4.6, BUN 17.    He had blood work on 12/04/2020 and I was able to review the results in the chart: Sodium was mildly low at 133, chloride mildly below normal at 97, blood sugar 115, BUN 28, creatinine 1.75, calcium 8.3, mildly low, GFR 39.  Urinalysis on 11/24/2020 was negative for infection.  Of note, he presented to the emergency room at [facility] on 12/12/2020 with weakness.  I reviewed the emergency room records.  He complained of dizziness, losing his balance and falls.  He was noted to have a history of orthostatic hypotension.  He is supposed to wear compression stockings and he was on midodrine 15 mg 3 times daily.  He had a head CT without contrast on 12/04/2020 and I reviewed the results: IMPRESSION: 1. Stable head CT, no acute intracranial process.   He had a head CT without contrast on 11/24/2020 and I reviewed the results: IMPRESSION: 1. No CT evidence for acute intracranial abnormality. Atrophy and mild chronic small vessel ischemic changes of the white matter. He has had multiple CT scans including head CT without contrast and cervical spine CT without contrast on 10/29/2019: None   IMPRESSION: 1. No acute intracranial abnormality.  Generalized atrophy 2. Negative for cervical fracture.   He had a head CT without contrast on 09/24/2017:  IMPRESSION: 1.  No acute intracranial abnormality. 2. Atrophy and chronic small vessel ischemia, unchanged from prior.   He had a head CT without contrast on 05/01/2017: IMPRESSION: Normal head CT.   Subtle chronic inflammatory change of the left maxillary and sphenoid sinuses.  He had a head CT without contrast on 06/18/2015 with the indication of confusion and dizziness for 2 to 3 months: IMPRESSION: No acute intracranial abnormality.   He had a  head CT without contrast on 12/23/2011 with indication of dizziness, recurrent falls:  IMPRESSION:   1.  No acute intracranial abnormality.  2.  Mild diffuse cerebral atrophy.    He had a brain MRI without contrast and MR angiogram of the head without contrast on 01/25/2003 for indication of altered mental status: IMPRESSION  NO ACUTE INTRACRANIAL ABNORMALITY.  NO EVIDENCE ON MRI FOR INTRACRANIAL ANEURYSM OR HEMORRHAGE.  IMPRESSION  NEGATIVE MRA OF THE BRAIN.  Per wife, he has had multiple falls.  He uses a cane but also has a rolling walker.  He currently has a single-point cane with him today.  She reports that he tries to hydrate well with water.  He quit smoking some 40 years ago.  He drinks alcohol very occasionally, about 2 beers per month.  His Past Medical History Is Significant For: Past Medical History:  Diagnosis Date   Anxiety    CAD (coronary artery disease)    a. s/p CABG 1997 b. PCI (BMS) of SVG to RCA 3/09 c. redo CABG 02/2011 with SVG to PDA, SVG to Lcx d. Cath 07/2013: ischemic cardiomyopathy with LVEF less than 20%, occlusion of the SVG's placed in 2012 e.stenting of LM in 07/2015 with PCI of LCx performed as well   CHF (congestive heart failure) (HCC)    Chronic systolic dysfunction of left ventricle    EF 30%   COVID    Depression    major   DJD (degenerative joint disease)    DM (diabetes mellitus) ([place]) 02/14/2011   GERD (gastroesophageal reflux disease)    Gout    HTN (hypertension) 02/14/2011   Hyperlipemia    Ischemic cardiomyopathy    s/p ICD Implantation by Dr Leonia Reeves   LBBB (left bundle branch block)    chronic   Osteoarthritis    Paroxysmal atrial fibrillation ([place]) 10/22/2014   single episode of AF x 3 hours 48 minutes recorded on ICD, [id number] score is at least 5    Renal disorder    Seborrhea    Ventricular tachycardia ([place]) 07/23/2020   appropriate ICD therapy delivered for VT at 222 bpm    His Past Surgical History Is Significant  For: Past Surgical History:  Procedure Laterality Date   BI-VENTRICULAR IMPLANTABLE CARDIOVERTER DEFIBRILLATOR UPGRADE N/A 08/24/2011   Procedure: BI-VENTRICULAR IMPLANTABLE CARDIOVERTER DEFIBRILLATOR UPGRADE;  Surgeon: Evans Lance, MD;  Location: [facility] CATH LAB;  Service: Cardiovascular;  Laterality: N/A;   BIV ICD GENERATOR CHANGEOUT N/A 02/13/2018   Procedure: BIV ICD GENERATOR CHANGEOUT;  Surgeon: Thompson Grayer, MD;  Location: [place] CV LAB;  Service: Cardiovascular;  Laterality: N/A;   CARDIAC CATHETERIZATION  08/03/2015   Procedure: Left Heart Cath and Cors/Grafts Angiography;  Surgeon: Peter M Martinique, MD;  Location: [place] CV LAB;  Service: Cardiovascular;;   CARDIAC CATHETERIZATION N/A 08/06/2015   Procedure: Coronary Stent Intervention w/Impella;  Surgeon: Jettie Booze, MD;  Location: [place] CV LAB;  Service: Cardiovascular;  Laterality: N/A;   CARDIAC CATHETERIZATION  08/06/2015   Procedure: Left Heart Cath;  Surgeon: Jettie Booze, MD;  Location: [place] CV LAB;  Service: Cardiovascular;;   CARDIAC CATHETERIZATION  08/06/2015   Procedure: Coronary Balloon Angioplasty;  Surgeon: Jettie Booze, MD;  Location: [place] CV LAB;  Service: Cardiovascular;;   CARDIAC DEFIBRILLATOR PLACEMENT  08/2004   initial placement, upgraded to [place] ICD by Dr Lovena Le 08/24/11 (MDT)   CARDIOVERSION N/A 09/26/2019   Procedure: CARDIOVERSION;  Surgeon: Larey Dresser, MD;  Location: [facility] ENDOSCOPY;  Service: Cardiovascular;  Laterality: N/A;   CATARACT EXTRACTION W/ INTRAOCULAR LENS  IMPLANT, BILATERAL  2012   CHOLECYSTECTOMY  01/05/2012   Procedure: LAPAROSCOPIC CHOLECYSTECTOMY WITH INTRAOPERATIVE CHOLANGIOGRAM;  Surgeon: Joyice Faster. Cornett, MD;  Location: [place];  Service: General;  Laterality: N/A;  laparoscopic cholecysectoym with intraoperative cholangiogram   COLONOSCOPY WITH PROPOFOL N/A 11/18/2013   Procedure: COLONOSCOPY WITH PROPOFOL;  Surgeon: Garlan Fair, MD;  Location: [facility] ENDOSCOPY;  Service: Endoscopy;  Laterality: N/A;   CORONARY ANGIOPLASTY WITH STENT PLACEMENT  06/2007   BMS to SVG to RCA   CORONARY ARTERY BYPASS GRAFT  01/1996   CABG x 5 LIMA to LAD SVG to diag1,2,svg to om,svg to RCA   CORONARY ARTERY BYPASS GRAFT  03/06/2011   CABG X2; Procedure: REDO CORONARY ARTERY BYPASS GRAFTING (CABG);  Surgeon: Grace Isaac, MD;  Location: [place];  Service: Open Heart Surgery;  Laterality: N/A;  times two grafts using right saphenous vein harvested endoscopically.   KNEE ARTHROTOMY  ~ 1978   RIGHT KNEE CARTILAGE REMOVED   LEFT HEART CATHETERIZATION WITH CORONARY ANGIOGRAM N/A 06/06/2011   Procedure: LEFT HEART CATHETERIZATION WITH CORONARY ANGIOGRAM;  Surgeon: Sueanne Margarita, MD;  Location: [place] CATH LAB;  Service: Cardiovascular;  Laterality: N/A;   LEFT HEART CATHETERIZATION WITH CORONARY/GRAFT ANGIOGRAM N/A 08/08/2013   Procedure: LEFT HEART CATHETERIZATION WITH Beatrix Fetters;  Surgeon: Sinclair Grooms, MD;  Location[facility] CATH LAB;  Service: Cardiovascular;  Laterality: N/A;   LUMBAR [place] SURGERY  2003   MI w/ stent and Impella Device placed (improved EF)  2017   RIGHT HEART CATHETERIZATION N/A 02/17/2014   Procedure: RIGHT HEART CATH;  Surgeon: Larey Dresser, MD;  Location: [facility] CATH LAB;  Service: Cardiovascular;  Laterality: N/A;   RIGHT/LEFT HEART CATH AND CORONARY/GRAFT ANGIOGRAPHY N/A 07/26/2020   Procedure: RIGHT/LEFT HEART CATH AND CORONARY/GRAFT ANGIOGRAPHY;  Surgeon: Larey Dresser, MD;  Location: [place] CV LAB;  Service: Cardiovascular;  Laterality: N/A;   TEE WITHOUT CARDIOVERSION N/A 09/26/2019   Procedure: TRANSESOPHAGEAL ECHOCARDIOGRAM (TEE);  Surgeon: Larey Dresser, MD;  Location: [facility] ENDOSCOPY;  Service: Cardiovascular;  Laterality: N/A;   VENOGRAM N/A 06/08/2011   Procedure: VENOGRAM;  Surgeon: Thompson Grayer, MD;  Location: [facility] CATH LAB;  Service: Cardiovascular;  Laterality: N/A;    His Family  History Is Significant For: Family History  Problem Relation Age of Onset   Heart failure Mother    Heart attack Mother    Heart failure Father    Heart attack Father    Coronary artery disease Other    Sudden Cardiac Death Brother        in his 20's   Heart attack Brother    Cancer Brother     His Social History Is Significant For: Social History   Socioeconomic History   Marital status: Married    Spouse name: Not on file   Number of children: Not on file   Years of education: Not on file   Highest education  level: Not on file  Occupational History   Not on file  Tobacco Use   Smoking status: Former    Packs/day: 0.50    Years: 15.00    Pack years: 7.50    Types: Cigarettes    Quit date: 04/17/1969    Years since quitting: 51.7   Smokeless tobacco: Former    Quit date: 05/05/1971  Vaping Use   Vaping Use: Never used  Substance and Sexual Activity   Alcohol use: Yes    Alcohol/week: 1.0 standard drink    Types: 1 Cans of beer per week    Comment: occasional   Drug use: No   Sexual activity: Never  Other Topics Concern   Not on file  Social History Narrative   Lives in [place], retired Theatre manager for [employer].  He collects antique metal toys   Social Determinants of Health   Financial Resource Strain: Not on file  Food Insecurity: Not on file  Transportation Needs: Not on file  Physical Activity: Not on file  Stress: Not on file  Social Connections: Not on file    His Allergies Are:  Allergies  Allergen Reactions   Codeine Nausea And Vomiting   Benazepril     lightheadedness   Metformin Hcl Other (See Comments)    Unknown reaction   Trazodone Nausea Only   Adhesive [Tape] Rash   Other Rash    LATEX EXAM GLOVES  :   His Current Medications Are:  Outpatient Encounter Medications as of 12/29/2020  Medication Sig   acetaminophen (TYLENOL) 500 MG tablet Take 1,000 mg by mouth every 6 (six) hours as needed for moderate pain.    allopurinol (ZYLOPRIM) 100 MG tablet Take 200 mg by mouth daily.   amiodarone (PACERONE) 200 MG tablet Take 1 tablet (200 mg total) by mouth daily.   atorvastatin (LIPITOR) 80 MG tablet TAKE 1 TABLET BY MOUTH ONCE DAILY AT  6  PM  IN  THE  EVENING (Patient taking differently: Take 80 mg by mouth every evening.)   Continuous Blood Gluc Sensor (FREESTYLE LIBRE 14 DAY SENSOR) MISC USE TO CHECK GLUCOSE THREE TIMES DAILY AS DIRECTED   ELIQUIS 5 MG TABS tablet Take 1 tablet by mouth twice daily   furosemide (LASIX) 40 MG tablet Take 2 tablets (80 mg total) every morning.  Take 1 tablet in the evening only if there is fluid build up or shortness of breath.   HYDROcodone-acetaminophen (NORCO/VICODIN) 5-325 MG tablet Take 1 tablet by mouth every 4 (four) hours as needed.   insulin aspart protamine- aspart (NOVOLOG MIX 70/30) (70-30) 100 UNIT/ML injection Inject into the skin daily as needed. According to blood sugar   loperamide (IMODIUM A-D) 2 MG tablet Take 2 mg by mouth 4 (four) times daily as needed for diarrhea or loose stools.   nitroGLYCERIN (NITROSTAT) 0.4 MG SL tablet Place 1 tablet (0.4 mg total) under the tongue every 5 (five) minutes as needed for chest pain. DO NOT EXCEED A TOTAL OF 3 DOSES IN 15 MINUTES   potassium chloride SA (KLOR-CON) 20 MEQ tablet Take 2 tablets (40 mEq total) by mouth daily. (Patient not taking: No sig reported)   QUEtiapine (SEROQUEL) 100 MG tablet Take 100 mg by mouth at bedtime.   ranolazine (RANEXA) 500 MG 12 hr tablet Take 500 mg by mouth 2 (two) times daily.   triamcinolone cream (KENALOG) 0.1 % Apply 1 application topically 2 (two) times daily as needed (for dry/irritated skin.).   venlafaxine (  EFFEXOR-XR) 150 MG 24 hr capsule Take 150 mg by mouth daily with breakfast.   vitamin B-12 (CYANOCOBALAMIN) 1000 MCG tablet Take 1,000 mcg by mouth daily.   No facility-administered encounter medications on file as of 12/29/2020.  :   Review of Systems:  Out of a complete  14 point review of systems, all are reviewed and negative with the exception of these symptoms as listed below:  Review of Systems  Neurological:        Pt and wife says his knees hurt when pt walks and tremors all over his body , Pt gait is off . Pt started to use cane and walker six months ago . Pt wife says his motor skill are off . His legs became getting in and out of a car. . Pt states he had covid six weeks ago pt is asymptomatic    Objective:  Neurological Exam  Physical Exam Physical Examination:   Vitals:   12/29/20 1444  BP: (!) 147/74  Pulse: 67    General Examination: The patient is a very pleasant 80 y.o. male in no acute distress. He appears somewhat frail and deconditioned.  He is well groomed.   HEENT: Normocephalic, atraumatic, pupils are equal, round and reactive to light, extraocular tracking is good without limitation to gaze excursion or nystagmus noted.  He is status post cataract repairs, no corrective glasses in place, no hearing aids, hearing is impaired.  Face is symmetric, no obvious facial masking, no significant nuchal rigidity.  He has fairly good passive range of motion in the neck.  No carotid bruits.  Airway examination reveals mild to moderate mouth dryness, dentures in place.  Tongue protrudes centrally and palate elevates symmetrically, no lip, neck or jaw tremor.    Chest: Clear to auscultation without wheezing, rhonchi or crackles noted.  Heart: S1+S2+0, regular and normal without murmurs, rubs or gallops noted.   Abdomen: Soft, non-tender and non-distended with normal bowel sounds appreciated on auscultation.  Extremities: There is trace edema in both lower extremities distally.  He has chronic appearing discoloration in the legs.   Skin: Warm and dry with evidence of bruising and prior skin lacerations.    Musculoskeletal: exam reveals arthritic changes in the hands.  Right knee is slightly wider than left.    Neurologically:  Mental status:  The patient is awake, alert and oriented in all 4 spheres. His immediate and remote memory, attention, language skills and fund of knowledge are appropriate.  He is hard of hearing and his wife provides most of the details of his history.  No hypophonia, no dysarthria.  Thought process is linear. Mood is normal and affect is normal.  Cranial nerves II - XII are as described above under HEENT exam.  Motor exam: Globally thin bulk, global strength of at least 4 out of 5, no obvious resting or postural or action tremor in the upper extremities, no lower extremity tremor.  Romberg is not tested due to safety concerns.  Reflexes are 1+ in the upper extremities and absent in the lower extremities, fine motor skills are globally mildly impaired in all 4 extremities, no significant lateralization, no significant decrement in amplitude with finger taps and foot taps.    Cerebellar testing: No dysmetria or intention tremor. There is no truncal or gait ataxia.  Sensory exam: intact to light touch in the upper and lower extremities.  Gait, station and balance: He stands with mild difficulty and pushes himself up.  He has a fleeting  sense of lightheadedness when he first stands up.  Posture is age-appropriate.  He walks with a single-point cane on the right side, preserved arm swing on the left, no obvious shuffling but he walks slightly wider base, mildly insecure early, turns slowly.  He walks slowly and cautiously.  Assessment and Plan:   In summary, TYLO KRASZEWSKI is a very pleasant 80 y.o.-year old male with an underlying complex medical history of chronic kidney disease, coronary artery disease with status post coronary artery bypass graft, congestive heart failure, ischemic cardiomyopathy, paroxysmal A. Fib on eliquis, diabetes, reflux disease, hypertension, orthostatic hypotension, on midodrine, hyperlipidemia, gout, ventricular tachycardia, with status post ICD placement, depression, insomnia, hearing loss,  chronic tinnitus, who presents for evaluation of his gait and balance disorder, recurrent falls.  He has had issues with his balance for years.  He has a longer standing history of orthostatic hypotension.  History and examination and chart review are not in keeping with parkinsonism.  On examination he has no telltale parkinsonian changes.  He does have a more nonspecific gait disorder, likely a multifactorial issue with his balance and his walking.  Contributors include prior falls with injuries, atherosclerotic changes in the brain and deconditioning as well as arthritis, orthostatic hypotension and potentially also medication effect, he is on numerous medications, in particular, Seroquel can cause balance problems and parkinsonism.  Again, he does not have any classic signs of Parkinson's disease or drug-induced parkinsonism at this time but is advised that his balance could be affected from some of his medications as well.  Unfortunately, this is a complex situation, there is no single cause most likely.  We talked about the importance of fall prevention, he is advised to use his walker.  I am not sure that he is actually safe enough to use a cane only at this time.  He is currently in physical therapy, he is advised to stay well-hydrated and change positions slowly.  He is encouraged to use his compression stockings.  He has a tendency to wear them at home but not when he goes out.  He is advised to follow-up with your office as scheduled and with his other specialists.  He does not need a additional scan or test from my end of things and does not need a scheduled follow-up in this clinic at this time.  I answered all their questions today and the patient and his wife are in agreement.  This was an extended visit with copious record review.   Thank you very much for allowing me to participate in the care of this nice patient. If I can be of any further assistance to you please do not hesitate to call me at  [phone].  Sincerely,   Star Age, MD, PhD

## 2021-01-03 ENCOUNTER — Ambulatory Visit (INDEPENDENT_AMBULATORY_CARE_PROVIDER_SITE_OTHER): Payer: Medicare HMO

## 2021-01-03 DIAGNOSIS — I5042 Chronic combined systolic (congestive) and diastolic (congestive) heart failure: Secondary | ICD-10-CM

## 2021-01-03 DIAGNOSIS — Z95810 Presence of automatic (implantable) cardiac defibrillator: Secondary | ICD-10-CM

## 2021-01-04 ENCOUNTER — Ambulatory Visit: Payer: Medicare HMO | Admitting: Physical Therapy

## 2021-01-04 NOTE — Progress Notes (Signed)
EPIC Encounter for ICM Monitoring  Patient Name: [name] is a 80 y.o. male Date: 01/04/2021 Primary Care Physican: Mayra Neer, MD Primary Cardiologist: Aundra Dubin Electrophysiologist: Allred Bi-V Pacing:  99.2%    12/14/2020 Weight: 17 lbs (baseline 174 lbs)   Time in AT/AF  0.0 hr/day (0.0%)         Spoke with wife and she reports pt is feeling well.  Not experiencing any fluid symptoms at this time.        OptiVol Thoracic impedance suggesting normal fluid levels.   Prescribed:  Furosemide 40 mg Take 2 tablets (80 mg total) every morning.  Take 1 tablet in the evening only if there is fluid build up or shortness of breath. (Decreased on 8/10 during ER visit) Potassium 20 mEq take 2 tablets by mouth daily (Potassium discontinued on 11/24/2020)   Labs: 11/24/2020 Creatinine 2.02, BUN 29, Potassium 3.9, Sodium 136, GFR 33 11/16/2020 Creatinine 1.66, BUN 25, Potassium 4.2, Sodium 136, GFR 42  10/11/2020 Creatinine 1.75, BUN 30, Potassium 4.2, Sodium 138, GFR 39 09/29/2020 Creatinine 1.40, BUN 23, Potassium 4.5, sodium 137, GFR 51 08/24/2020 Creatinine 1.74, BUN 29, Potassium 3.8, Sodium 138, GFR 40 A complete set of results can be found in Results Review.   Recommendations: No changes and encouraged to call if experiencing any fluid symptoms.   Follow-up plan: ICM clinic phone appointment on 02/07/2021.   91 day device clinic remote transmission 02/10/2021.     EP/Cardiology Office Visits:  01/19/2021 with Dr Aundra Dubin.     Copy of ICM check sent to Dr. Rayann Heman.    3 month ICM trend: 01/03/2021.    1 Year ICM trend:       [name], RN 01/04/2021 1:18 PM

## 2021-01-10 ENCOUNTER — Ambulatory Visit: Payer: Medicare HMO | Admitting: Physical Therapy

## 2021-01-18 DIAGNOSIS — M25561 Pain in right knee: Secondary | ICD-10-CM | POA: Diagnosis not present

## 2021-01-19 ENCOUNTER — Ambulatory Visit (HOSPITAL_COMMUNITY)
Admission: RE | Admit: 2021-01-19 | Discharge: 2021-01-19 | Disposition: A | Discharge status: Home / Self Care | Payer: Medicare HMO | Source: Ambulatory Visit | Attending: Cardiology | Admitting: Cardiology

## 2021-01-19 ENCOUNTER — Encounter (HOSPITAL_COMMUNITY): Payer: Self-pay | Admitting: Cardiology

## 2021-01-19 ENCOUNTER — Other Ambulatory Visit: Payer: Self-pay

## 2021-01-19 VITALS — BP 130/70 | HR 80 | Wt 177.2 lb

## 2021-01-19 DIAGNOSIS — Z87891 Personal history of nicotine dependence: Secondary | ICD-10-CM | POA: Insufficient documentation

## 2021-01-19 DIAGNOSIS — I480 Paroxysmal atrial fibrillation: Secondary | ICD-10-CM | POA: Diagnosis not present

## 2021-01-19 DIAGNOSIS — E1122 Type 2 diabetes mellitus with diabetic chronic kidney disease: Secondary | ICD-10-CM | POA: Diagnosis not present

## 2021-01-19 DIAGNOSIS — Z7902 Long term (current) use of antithrombotics/antiplatelets: Secondary | ICD-10-CM | POA: Diagnosis not present

## 2021-01-19 DIAGNOSIS — I2510 Atherosclerotic heart disease of native coronary artery without angina pectoris: Secondary | ICD-10-CM | POA: Diagnosis not present

## 2021-01-19 DIAGNOSIS — Z794 Long term (current) use of insulin: Secondary | ICD-10-CM | POA: Diagnosis not present

## 2021-01-19 DIAGNOSIS — E785 Hyperlipidemia, unspecified: Secondary | ICD-10-CM | POA: Diagnosis not present

## 2021-01-19 DIAGNOSIS — Z7901 Long term (current) use of anticoagulants: Secondary | ICD-10-CM | POA: Diagnosis not present

## 2021-01-19 DIAGNOSIS — N183 Chronic kidney disease, stage 3 unspecified: Secondary | ICD-10-CM | POA: Diagnosis not present

## 2021-01-19 DIAGNOSIS — I255 Ischemic cardiomyopathy: Secondary | ICD-10-CM | POA: Diagnosis not present

## 2021-01-19 DIAGNOSIS — R42 Dizziness and giddiness: Secondary | ICD-10-CM | POA: Diagnosis not present

## 2021-01-19 DIAGNOSIS — Z79899 Other long term (current) drug therapy: Secondary | ICD-10-CM

## 2021-01-19 DIAGNOSIS — Z09 Encounter for follow-up examination after completed treatment for conditions other than malignant neoplasm: Secondary | ICD-10-CM | POA: Insufficient documentation

## 2021-01-19 DIAGNOSIS — I5042 Chronic combined systolic (congestive) and diastolic (congestive) heart failure: Secondary | ICD-10-CM | POA: Diagnosis not present

## 2021-01-19 LAB — COMPREHENSIVE METABOLIC PANEL
ALT: 22 U/L (ref 0–44)
AST: 19 U/L (ref 15–41)
Albumin: 3.6 g/dL (ref 3.5–5.0)
Alkaline Phosphatase: 86 U/L (ref 38–126)
Anion gap: 10 (ref 5–15)
BUN: 36 mg/dL — ABNORMAL HIGH (ref 8–23)
CO2: 26 mmol/L (ref 22–32)
Calcium: 9.6 mg/dL (ref 8.9–10.3)
Chloride: 98 mmol/L (ref 98–111)
Creatinine, Ser: 1.55 mg/dL — ABNORMAL HIGH (ref 0.61–1.24)
GFR, Estimated: 46 mL/min — ABNORMAL LOW (ref >60)
Glucose, Bld: 356 mg/dL — ABNORMAL HIGH (ref 70–99)
Potassium: 4.7 mmol/L (ref 3.5–5.1)
Sodium: 134 mmol/L — ABNORMAL LOW (ref 135–145)
Total Bilirubin: 0.7 mg/dL (ref 0.3–1.2)
Total Protein: 7.6 g/dL (ref 6.5–8.1)

## 2021-01-19 LAB — CBC
HCT: 41.4 % (ref 39.0–52.0)
Hemoglobin: 13.4 g/dL (ref 13.0–17.0)
MCH: 32.2 pg (ref 26.0–34.0)
MCHC: 32.4 g/dL (ref 30.0–36.0)
MCV: 99.5 fL (ref 80.0–100.0)
Platelets: 316 10*3/uL (ref 150–400)
RBC: 4.16 MIL/uL — ABNORMAL LOW (ref 4.22–5.81)
RDW: 14.7 % (ref 11.5–15.5)
WBC: 14.5 10*3/uL — ABNORMAL HIGH (ref 4.0–10.5)
nRBC: 0 % (ref 0.0–0.2)

## 2021-01-19 LAB — TSH: TSH: 0.4 u[IU]/mL (ref 0.350–4.500)

## 2021-01-19 NOTE — Patient Instructions (Addendum)
EKG done today.  Labs done today. We will contact you only if your labs are abnormal.  No medication changes were made. Please continue all current medications as prescribed.  Your physician recommends that you schedule a follow-up appointment in: 3 months with an echo prior to your exam. Please contact our office in December to schedule a January appointment.   If you have any questions or concerns before your next appointment please send [place] a message through [place] or call our office at [phone].    TO LEAVE A MESSAGE FOR THE NURSE SELECT OPTION 2, PLEASE LEAVE A MESSAGE INCLUDING: YOUR NAME DATE OF BIRTH CALL BACK NUMBER REASON FOR CALL**this is important as we prioritize the call backs  YOU WILL RECEIVE A CALL BACK THE SAME DAY AS LONG AS YOU CALL BEFORE 4:00 PM   Do the following things EVERYDAY: Weigh yourself in the morning before breakfast. Write it down and keep it in a log. Take your medicines as prescribed Eat low salt foods--Limit salt (sodium) to 2000 mg per day.  Stay as active as you can everyday Limit all fluids for the day to less than 2 liters   At the [facility], you and your health needs are our priority. As part of our continuing mission to provide you with exceptional heart care, we have created designated Provider Care Teams. These Care Teams include your primary Cardiologist (physician) and Advanced Practice Providers (APPs- Physician Assistants and Nurse Practitioners) who all work together to provide you with the care you need, when you need it.   You may see any of the following providers on your designated Care Team at your next follow up: Dr Glori Bickers Dr Haynes Kerns, NP Lyda Jester, [place] Audry Riles, PharmD   Please be sure to bring in all your medications bottles to every appointment.

## 2021-01-20 ENCOUNTER — Other Ambulatory Visit (HOSPITAL_COMMUNITY): Payer: Self-pay | Admitting: Cardiology

## 2021-01-20 NOTE — Progress Notes (Signed)
Advanced Heart Failure Clinic Note   Patient ID: [name], male   DOB: [birth year], 80 y.o.   MRN: [id number] PCP: Dr. Lynnda Child Cardiology: Dr Aundra Dubin  58 y.o. with history of CAD s/p CABG and redo CABG as well as ischemic cardiomyopathy with CRT-D device presents for followup of CHF and CAD.  He had a Cardiolite in the 4/15 showing lateral wall ischemia.  LHC at that time showed all his vein grafts from both CABG surgeries occluded.  The LIMA-LAD was patent and there was a 90% distal LM stenosis.  The lateral ischemia likely corresponded to LCx territory downstream from the LM stenosis.  PCI of the distal LM was thought to be high risk and characteristics of the lesion were not favorable for PCI.  Last echo showed EF 25-30% with moderate RV systolic dysfunction.  He had RHC in 11/15 with normal filling pressures and relatively preserved cardiac index (2.55).    In 4/17, he was admitted with chest pain concerning for unstable angina.  Angiography showed 85% distal LM with 90% ostial LCx stenosis.  LIMA was patent but proximal LAD, proximal RCA, and all SVGs were totally occluded. High had successful DES to LM and PTCA to ostial LCx with Impella support.  [name] was stopped and he was put back on low dose lisinopril due to symptomatic hypotension.  He had Cardiolite in 11/17 with scar, no ischemia.   He was admitted in 1/19 with AKI, creatinine up to 3. Meds held then restarted.   Echo 3/19 with EF 30-35%.   He was admitted with syncope and AKI in 6/19.  He was thought to be dehydrated and orthostatic. Losartan stopped but eventually restarted.   He was noted to have prolonged atrial fibrillation by device check in 3/20, now on Eliquis.   Echo was done in 6/20, showing EF 25-30% with inferior/inferolateral AK, moderate LV dilation, mild LVH, mild MR, severe LAE.    CPX in 11/20 showed moderate functional limitation due to HF.   He went into atrial fibrillation in 6/21, had TEE-guided DCCV  in 6/21 back to NSR.  TEE showed EF 25-30%, moderately dilated LV, normal RV.   He was admitted in 4/22 with VT and ICD discharge.  RHC/LHC showed totally occluded SVGs, totally occluded RCA and LAD; the LIMA-LAD was patent and there was a patent LM sent with good flow into the LCx (no changes).  Filling pressure and cardiac output were normal. VT was thought to be scar-mediated.  Amiodarone was begun.  BP was soft in the hospital, meds were cut back.    He returns for followup of CHF and CAD.  He is in NSR today.  He is off BP-active medications and on midodrine 15 mg tid due to severe orthostasis. He has not been able to decrease midodrine without significant lightheadedness.  Currently, he is doing well with only mild lightheadedness if he stands up too fast. No falls.  He has significant knee pain, improved by recent right knee steroid injection.  Appetite has improved.  He is not short of breath walking short distances on flat ground.  No orthopnea/PND. No chest pain.  His wife thinks that he is doing better, he is more alert now that his BP is running higher.    ECG (personally reviewed): NSR, BiV paced  [employer] device interrogation: Fluid index < threshold with stable thoracic impedance, no AF, >99% BiV pacing.   Labs (9/15): LDL particle number [id number], LDL 57, LFTs normal Labs (  10/15): K 4.4, creatinine 1.4 Labs (11/15): K 4.3, creatinine 1.36 Labs (12/15): K 4.8, creatinine 1.34 Labs (3/16): K 4, creatinine 1.38, LDL 46, HDl 35 Labs (7/16): K 4, creatinine 1.39, BNP 211 Labs (03/03/15): K 4.1, creatinine 1.47, BNP 227, LDL 80, HDL 32 Labs (12/16): K 4.6, creatinine 1.12, LDL 68, HDL 34 Labs (4/17): K 3.4, creatinine 1.15 Labs (6/17): K 3.8, creatinine 1.67 Labs (7/17): K 4, creatinine 1.35 Labs (10/17): K 3.9, creatinine 1.31, LDL 45, HDL 30 Labs (2/18): K 4, creatinine 1.43 Labs (5/18): LDL 44, HDL 27 Labs (6/18): K 4.1, creatinine 1.49 Labs (12/18): K 4.4, creatinine 1.44 Labs  (1/19): K 4.3, creatinine 1.48 Labs (2/19): K 3.7, creatinine 1.6 Labs (6/19): K 4, creatinine 1.5 Labs (9/19): K 4.4, creatinine 1.6, hgb 13.4 Labs (10/19): K 4.3, creatinine 1.44 Labs (1/20): K 4, creatinine 1.65 Labs (6/20): K 3.9, creatinine 1.58, LDL 58, HDL 31, hgb 13.3 Labs (7/20): K 3.9, creatinine 1.5 Labs (10/20): K 4, creatinine 1.7 Labs (12/20): K 3.9, creatinine 1.56 Labs (5/21): K 4.1, creatinine 1.7 Labs (8/21): K 4.4, creatinine 1.66, LDL 57 Labs (4/22): creatinine 1.58, hgb 12.3, LFTs normal, TSH normal Labs (5/22): K 4.1, creatinine 1.74 Labs (8/22): BNP 456, K 3.8, creatinine 1.75  PMH: 1. Gout 2. Hyperlipidemia 3. CAD: CABG 1997 and redo 11/12.   - LHC (4/15) with totally occluded LAD, totally occluded RCA, 80% distal LM, 50% mLCx, 2 SVG-RCA grafts totally occluded, 2 SVG-OM grafts totally occluded, patent LIMA-LAD.  Cardiolite prior to 4/15 cath showed lateral wall ischemia (LCx territory).  PCI to distal LM would be a high risk procedure.   - Cardiolite (8/16) with EF 20%, severe scar in RCA and probably LCx territory, minimal peri-infarct ischemia.   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  - Unstable angina 4/17: 85% distal LM with 90% ostial LCx stenosis.  LIMA was patent but proximal LAD, proximal RCA, and all SVGs were totally occluded. He had successful DES to LM and PTCA to ostial LCx with Impella support  - Cardiolite (11/17): EF 20%, prior MI, no ischemia.  - LHC (4/22): totally occluded SVGs, totally occluded RCA and LAD; the LIMA-LAD was patent and there was a patent LM sent with good flow into the LCx (no changes) 4. Ischemic Cardiomyopathy: [employer] CRT-D device.   - Echo (6/15) with EF 25-30%, moderate LV dilation, inferior and inferolateral akinesis, moderately decreased RV systolic function, mild MR.   - RHC (11/15) with mean RA 5, PA 23/6, mean PCWP 9, CI 2.55.  - Echo (4/17): EF 25-30% with mild MR.  - Echo  (11/17): EF 25-30%, moderately dilated LV, grade II diastolic dysfunction, mild-moderate MR, mildly decreased RV systolic function.  - Echo (3/19): EF 30-35%, inferior/inferolateral akinesis, normal RV size with mildly decreased systolic function.  - Echo (6/20): EF 25-30% with inferior/inferolateral AK, moderate LV dilation, mild LVH, mild MR, severe LAE. - CPX (11/20): VO2 max 14.9 (60% predicted), VE/VCO2 slope 34, RER 1.05.  Moderate functional deficit due to HF.  - TEE (6/21): EF 25-30%, moderately dilated LV, normal RV. - RHC (4/22): mean RA 3, PA 23/3, mean PCWP 4, CI 3.13 Fick/3.25 thermo 5. H/o cholecystectomy 6. OA 7. Depression 8. Type II diabetes 9. GERD 10. CKD stage 3 11. ?Early dementia 12. Atrial fibrillation: Paroxysmal.  - DCCV to NSR in 6/21.  13. B12 deficiency.  14. VT in 4/22: Amiodarone started.   SH: Married, prior smoker (many years  ago), lives in [place].    FH: CAD  ROS: All systems reviewed and negative except as per HPI.   Current Outpatient Medications  Medication Sig Dispense Refill   acetaminophen (TYLENOL) 500 MG tablet Take 1,000 mg by mouth every 6 (six) hours as needed for moderate pain.     allopurinol (ZYLOPRIM) 100 MG tablet Take 200 mg by mouth daily.     amiodarone (PACERONE) 200 MG tablet Take 1 tablet (200 mg total) by mouth daily. 30 tablet 6   atorvastatin (LIPITOR) 80 MG tablet TAKE 1 TABLET BY MOUTH ONCE DAILY AT  6  PM  IN  THE  EVENING (Patient taking differently: TAKE 1 TABLET BY MOUTH ONCE DAILY AT  6  PM  IN  THE  EVENING) 90 tablet 0   Continuous Blood Gluc Sensor (FREESTYLE LIBRE 14 DAY SENSOR) MISC USE TO CHECK GLUCOSE THREE TIMES DAILY AS DIRECTED     ELIQUIS 5 MG TABS tablet Take 1 tablet by mouth twice daily 60 tablet 6   furosemide (LASIX) 80 MG tablet Take 80 mg by mouth daily.     HYDROcodone-acetaminophen (NORCO/VICODIN) 5-325 MG tablet Take 1 tablet by mouth every 4 (four) hours as needed. 10 tablet 0   insulin aspart  protamine- aspart (NOVOLOG MIX 70/30) (70-30) 100 UNIT/ML injection Inject into the skin daily as needed. According to blood sugar     loperamide (IMODIUM A-D) 2 MG tablet Take 2 mg by mouth 4 (four) times daily as needed for diarrhea or loose stools.     midodrine (PROAMATINE) 10 MG tablet Take 15 mg by mouth 3 (three) times daily.     nitroGLYCERIN (NITROSTAT) 0.4 MG SL tablet Place 1 tablet (0.4 mg total) under the tongue every 5 (five) minutes as needed for chest pain. DO NOT EXCEED A TOTAL OF 3 DOSES IN 15 MINUTES 25 tablet 6   potassium chloride SA (KLOR-CON) 20 MEQ tablet Take 2 tablets (40 mEq total) by mouth daily. 60 tablet 6   QUEtiapine (SEROQUEL) 100 MG tablet Take 50 mg by mouth at bedtime.     ranolazine (RANEXA) 500 MG 12 hr tablet Take 500 mg by mouth 2 (two) times daily.     triamcinolone cream (KENALOG) 0.1 % Apply 1 application topically 2 (two) times daily as needed (for dry/irritated skin.).     venlafaxine (EFFEXOR-XR) 150 MG 24 hr capsule Take 150 mg by mouth daily with breakfast.     vitamin B-12 (CYANOCOBALAMIN) 1000 MCG tablet Take 1,000 mcg by mouth daily.     No current facility-administered medications for this encounter.   BP 130/70   Pulse 80   Wt 80.4 kg (177 lb 3.2 oz)   SpO2 98%   BMI 26.17 kg/m    Wt Readings from Last 3 Encounters:  01/19/21 80.4 kg (177 lb 3.2 oz)  12/04/20 81.2 kg (179 lb)  11/24/20 76.9 kg (169 lb 9.6 oz)    General: NAD Neck: No JVD, no thyromegaly or thyroid nodule.  Lungs: Clear to auscultation bilaterally with normal respiratory effort. CV: Nondisplaced PMI.  Heart regular S1/S2, no S3/S4, no murmur.  No peripheral edema.  No carotid bruit.  Normal pedal pulses.  Abdomen: Soft, nontender, no hepatosplenomegaly, no distention.  Skin: Intact without lesions or rashes.  Neurologic: Alert and oriented x 3.  Psych: Normal affect. Extremities: No clubbing or cyanosis.  HEENT: Normal.   Assessment/Plan: 1. Chronic systolic  CHF: Ischemic cardiomyopathy. He has a [employer] CRT-D device.  CPX in 11/20 showed moderate functional limitation due to CHF.  TEE in 6/21 with EF 25-30%, normal RV.  RHC in 4/22 showed normal filling pressures and normal cardiac output.  Though weight is up today, he is not volume overloaded by exam or Optivol.  NYHA class III symptoms.  Orthostatic symptoms much improved after cutting back on BP-active meds and on midodrine 15 mg tid.  - Continue Lasix 80 mg daily. BMET today.         - Wear graded compression stockings and abdominal binder as much as possible.  - Continue midodrine 15 mg tid, I do not think that he will be able to decrease this (failed decrease to 10 mg tid recently).  - Unable to tolerate GDMT due to orthostasis.  - I will arrange for echo at his 3 month followup.  2. CAD: s/p CABG and redo. Had DES to distal LM, angioplasty to ostial LCx in 4/17. Cardiolite 11/17 with scar, no ischemia. LHC in 4/22 showed patent LIMA-LAD and patent LM stent with good flow down LCx, no interventional target.  No chest pain.  - Continue statin.  - He is off ASA/Plavix and taking apixaban 5 mg bid.  - Continue ranolazine 500 mg bid.  3. CKD stage III: BMET today.    4. Hyperlipidemia: Continue statin. Good lipids in 3/22.  5. Atrial fibrillation: Paroxysmal atrial fibrillation.  NSR today.  - Continue apixaban.   - Continue amiodarone 200 mg daily, check LFTs and TSH today.  Will need regular eye exam.  6. VT: In 4/22, unchanged coronaries/grafts and optimized filling pressures/cardiac output.  Think scar-mediated.  Amiodarone begun.  - Continue amiodarone 200 mg daily.   Followup in 3 months with echo.     [name] 01/20/2021

## 2021-02-07 ENCOUNTER — Other Ambulatory Visit (HOSPITAL_COMMUNITY): Payer: Self-pay

## 2021-02-07 ENCOUNTER — Ambulatory Visit (INDEPENDENT_AMBULATORY_CARE_PROVIDER_SITE_OTHER): Payer: Medicare HMO

## 2021-02-07 ENCOUNTER — Other Ambulatory Visit (HOSPITAL_COMMUNITY): Payer: Self-pay | Admitting: Cardiology

## 2021-02-07 DIAGNOSIS — I5042 Chronic combined systolic (congestive) and diastolic (congestive) heart failure: Secondary | ICD-10-CM

## 2021-02-07 DIAGNOSIS — Z95810 Presence of automatic (implantable) cardiac defibrillator: Secondary | ICD-10-CM

## 2021-02-07 MED ORDER — AMIODARONE HCL 200 MG PO TABS: 200.0000 mg | ORAL_TABLET | Freq: Every day | ORAL | 3 refills | Status: AC

## 2021-02-09 NOTE — Progress Notes (Signed)
EPIC Encounter for ICM Monitoring  Patient Name: [name] is a 80 y.o. male Date: 02/09/2021 Primary Care Physican: Mayra Neer, MD Primary Cardiologist: Aundra Dubin Electrophysiologist: Allred Bi-V Pacing:  99.8%    12/14/2020 Weight: 17 lbs (baseline 174 lbs)   Time in AT/AF  0.0 hr/day (0.0%)         Spoke with wife and she reports pt is feeling well.  Not experiencing any fluid symptoms at this time. She stated palliative care started yesterday.        OptiVol Thoracic impedance suggesting normal fluid levels.   Prescribed:  Furosemide 40 mg Take 2 tablets (80 mg total) every morning.  Take 1 tablet in the evening only if there is fluid build up or shortness of breath. (Decreased on 8/10 during ER visit) Potassium 20 mEq take 2 tablets by mouth daily (Potassium discontinued on 11/24/2020)   Labs: 01/19/2021 Creatinine 1.55, BUN 36, Potassium 4.7, Sodium 134, GFR 46 11/24/2020 Creatinine 2.02, BUN 29, Potassium 3.9, Sodium 136, GFR 33 11/16/2020 Creatinine 1.66, BUN 25, Potassium 4.2, Sodium 136, GFR 42  10/11/2020 Creatinine 1.75, BUN 30, Potassium 4.2, Sodium 138, GFR 39 09/29/2020 Creatinine 1.40, BUN 23, Potassium 4.5, sodium 137, GFR 51 08/24/2020 Creatinine 1.74, BUN 29, Potassium 3.8, Sodium 138, GFR 40 A complete set of results can be found in Results Review.   Recommendations: No changes and encouraged to call if experiencing any fluid symptoms.   Follow-up plan: ICM clinic phone appointment on 03/21/2021.   91 day device clinic remote transmission 02/10/2021.     EP/Cardiology Office Visits:  Recall 04/20/2021 with Dr Aundra Dubin.  Recall 12/02/2020 with [name], PA.    Copy of ICM check sent to Dr. Rayann Heman.     3 month ICM trend: 02/07/2021.    1 Year ICM trend:       [name], RN 02/09/2021 1:45 PM

## 2021-02-10 ENCOUNTER — Ambulatory Visit (INDEPENDENT_AMBULATORY_CARE_PROVIDER_SITE_OTHER)

## 2021-02-10 DIAGNOSIS — I5042 Chronic combined systolic (congestive) and diastolic (congestive) heart failure: Secondary | ICD-10-CM | POA: Diagnosis not present

## 2021-02-10 LAB — CUP PACEART REMOTE DEVICE CHECK
Battery Remaining Longevity: 31 mo
Battery Voltage: 2.96 V
Brady Statistic AP VP Percent: 2.55 %
Brady Statistic AP VS Percent: 0.01 %
Brady Statistic AS VP Percent: 97.41 %
Brady Statistic AS VS Percent: 0.03 %
Brady Statistic RA Percent Paced: 2.56 %
Brady Statistic RV Percent Paced: 99.9 %
Date Time Interrogation Session: 20221027022505
HighPow Impedance: 44 Ohm
HighPow Impedance: 54 Ohm
Implantable Lead Implant Date: 20111003
Implantable Lead Implant Date: 20111003
Implantable Lead Implant Date: 20130509
Implantable Lead Location: 753858
Implantable Lead Location: 753859
Implantable Lead Location: 753860
Implantable Lead Model: 4196
Implantable Lead Model: 5076
Implantable Lead Model: 6947
Implantable Pulse Generator Implant Date: 20191030
Lead Channel Impedance Value: 342 Ohm
Lead Channel Impedance Value: 380 Ohm
Lead Channel Impedance Value: 399 Ohm
Lead Channel Impedance Value: 494 Ohm
Lead Channel Impedance Value: 608 Ohm
Lead Channel Impedance Value: 779 Ohm
Lead Channel Pacing Threshold Amplitude: 0.625 V
Lead Channel Pacing Threshold Amplitude: 1 V
Lead Channel Pacing Threshold Amplitude: 2.5 V
Lead Channel Pacing Threshold Pulse Width: 0.4 ms
Lead Channel Pacing Threshold Pulse Width: 0.4 ms
Lead Channel Pacing Threshold Pulse Width: 0.4 ms
Lead Channel Sensing Intrinsic Amplitude: 1 mV
Lead Channel Sensing Intrinsic Amplitude: 1 mV
Lead Channel Sensing Intrinsic Amplitude: 31.625 mV
Lead Channel Sensing Intrinsic Amplitude: 31.625 mV
Lead Channel Setting Pacing Amplitude: 1.5 V
Lead Channel Setting Pacing Amplitude: 2 V
Lead Channel Setting Pacing Amplitude: 3 V
Lead Channel Setting Pacing Pulse Width: 0.4 ms
Lead Channel Setting Pacing Pulse Width: 0.4 ms
Lead Channel Setting Sensing Sensitivity: 0.3 mV

## 2021-02-18 NOTE — Progress Notes (Signed)
Remote ICD transmission.   

## 2021-03-21 ENCOUNTER — Ambulatory Visit (INDEPENDENT_AMBULATORY_CARE_PROVIDER_SITE_OTHER): Payer: Medicare HMO

## 2021-03-21 DIAGNOSIS — I5042 Chronic combined systolic (congestive) and diastolic (congestive) heart failure: Secondary | ICD-10-CM | POA: Diagnosis not present

## 2021-03-21 DIAGNOSIS — Z95810 Presence of automatic (implantable) cardiac defibrillator: Secondary | ICD-10-CM | POA: Diagnosis not present

## 2021-03-22 ENCOUNTER — Other Ambulatory Visit (HOSPITAL_COMMUNITY): Payer: Self-pay

## 2021-03-22 MED ORDER — POTASSIUM CHLORIDE CRYS ER 20 MEQ PO TBCR: 40.0000 meq | EXTENDED_RELEASE_TABLET | Freq: Every day | ORAL | 6 refills | Status: AC

## 2021-03-23 NOTE — Progress Notes (Signed)
EPIC Encounter for ICM Monitoring  Patient Name: [name] is a 80 y.o. male Date: 03/23/2021 Primary Care Physican: Mayra Neer, MD Primary Cardiologist: Aundra Dubin Electrophysiologist: Allred Bi-V Pacing:  99.8%    12/14/2020 Weight: 17 lbs (baseline 174 lbs)   Time in AT/AF  0.0 hr/day (0.0%)         Spoke with wife and she reports he is in palliative care.  They have not decided to turn off the defibrillator at this point but will let device clinic know when that needs to occur.        OptiVol Thoracic impedance suggesting normal fluid levels.   Prescribed:  Furosemide 40 mg Take 2 tablets (80 mg total) every morning.  Take 1 tablet in the evening only if there is fluid build up or shortness of breath. (Decreased on 8/10 during ER visit) Potassium 20 mEq take 2 tablets by mouth daily (Potassium discontinued on 11/24/2020)   Labs: 01/19/2021 Creatinine 1.55, BUN 36, Potassium 4.7, Sodium 134, GFR 46 11/24/2020 Creatinine 2.02, BUN 29, Potassium 3.9, Sodium 136, GFR 33 11/16/2020 Creatinine 1.66, BUN 25, Potassium 4.2, Sodium 136, GFR 42  10/11/2020 Creatinine 1.75, BUN 30, Potassium 4.2, Sodium 138, GFR 39 09/29/2020 Creatinine 1.40, BUN 23, Potassium 4.5, sodium 137, GFR 51 08/24/2020 Creatinine 1.74, BUN 29, Potassium 3.8, Sodium 138, GFR 40 A complete set of results can be found in Results Review.   Recommendations: No changes and encouraged to call if experiencing any fluid symptoms.   Follow-up plan: ICM clinic phone appointment on 04/25/2021.   91 day device clinic remote transmission 05/12/2021.     EP/Cardiology Office Visits:  Recall 04/20/2021 with Dr Aundra Dubin.  Recall 12/02/2020 with [name], PA.    Copy of ICM check sent to Dr. Rayann Heman.  3 month ICM trend: 03/21/2021.    12-14 Month ICM trend:       [name], RN 03/23/2021 12:08 PM

## 2021-04-04 ENCOUNTER — Telehealth: Payer: Self-pay

## 2021-04-04 NOTE — Telephone Encounter (Signed)
Returned [employer]. No answer, LMTCB.  Spoke to [place] and requested a faxed order from provider. Direct fax number sent. Patient resides at home.

## 2021-04-04 NOTE — Telephone Encounter (Signed)
Received call from Santiago Glad, nurse with [facility].  She stated patient is transitioning and needs to have defibrillator turned off.   Advised will send to device clinic triage for further follow up.  Karen's cell number is [phone].  If she does not answer, she asked to call [facility] triage nurse, Anderson Malta at [phone].    Routed to device clinic triage.

## 2021-04-04 NOTE — Telephone Encounter (Signed)
Ordered received via fax requesting tachy therpaies disabled per Dr. Ronne Binning. Dr. Rayann Heman signed orders and spoke to Santiago Glad to advise I have passed information to [employer] rep Leanna Sato and he will contact them to set up. Verbalized understanding.

## 2021-04-17 DEATH — deceased

## 2021-04-20 ENCOUNTER — Telehealth: Payer: Self-pay

## 2021-04-20 NOTE — Telephone Encounter (Signed)
Patient is deceased per family, all remotes have been canceled and a return kit sent to their home °

## 2021-04-20 NOTE — Telephone Encounter (Signed)
-----   Message from Zara Council sent at 04/20/2021 11:25 AM EST ----- Regarding: Pt deceased Hello,   Patient's wife, [name], called in to inform our office that the patient passed away on 2021/04/16. She also requested to speak with you to personally thank you if possible.   Please return her call at your convenience. She may be reached at [phone].  Thank you

## 2021-04-20 NOTE — Telephone Encounter (Signed)
Returned call to wife as requested.  She wanted to thank me for helping the patient and her as well.  She was very appreciative of [facility] and all the support.  She has the support of good friends and family to help her through this difficult time.
